# Patient Record
Sex: Male | Born: 1960 | Race: White | Hispanic: No | Marital: Married | Smoking: Former smoker
Health system: Southern US, Community
[De-identification: ages and names within clinical notes are randomized; demographics above are authoritative.]

## PROBLEM LIST (undated history)

## (undated) DIAGNOSIS — F4310 Post-traumatic stress disorder, unspecified: Secondary | ICD-10-CM

## (undated) DIAGNOSIS — K219 Gastro-esophageal reflux disease without esophagitis: Secondary | ICD-10-CM

## (undated) DIAGNOSIS — Z720 Tobacco use: Secondary | ICD-10-CM

## (undated) DIAGNOSIS — E785 Hyperlipidemia, unspecified: Secondary | ICD-10-CM

## (undated) DIAGNOSIS — T8781 Dehiscence of amputation stump: Secondary | ICD-10-CM

## (undated) DIAGNOSIS — N179 Acute kidney failure, unspecified: Secondary | ICD-10-CM

## (undated) DIAGNOSIS — I499 Cardiac arrhythmia, unspecified: Secondary | ICD-10-CM

## (undated) DIAGNOSIS — R002 Palpitations: Secondary | ICD-10-CM

## (undated) DIAGNOSIS — N200 Calculus of kidney: Secondary | ICD-10-CM

## (undated) DIAGNOSIS — Z89439 Acquired absence of unspecified foot: Secondary | ICD-10-CM

## (undated) DIAGNOSIS — I10 Essential (primary) hypertension: Secondary | ICD-10-CM

## (undated) DIAGNOSIS — E11621 Type 2 diabetes mellitus with foot ulcer: Secondary | ICD-10-CM

## (undated) DIAGNOSIS — F329 Major depressive disorder, single episode, unspecified: Secondary | ICD-10-CM

## (undated) DIAGNOSIS — F32A Depression, unspecified: Secondary | ICD-10-CM

## (undated) DIAGNOSIS — Z9889 Other specified postprocedural states: Secondary | ICD-10-CM

## (undated) DIAGNOSIS — J449 Chronic obstructive pulmonary disease, unspecified: Secondary | ICD-10-CM

## (undated) DIAGNOSIS — I998 Other disorder of circulatory system: Secondary | ICD-10-CM

## (undated) DIAGNOSIS — L97529 Non-pressure chronic ulcer of other part of left foot with unspecified severity: Secondary | ICD-10-CM

## (undated) DIAGNOSIS — I5032 Chronic diastolic (congestive) heart failure: Secondary | ICD-10-CM

## (undated) DIAGNOSIS — K7460 Unspecified cirrhosis of liver: Secondary | ICD-10-CM

## (undated) DIAGNOSIS — F419 Anxiety disorder, unspecified: Secondary | ICD-10-CM

## (undated) DIAGNOSIS — Z8249 Family history of ischemic heart disease and other diseases of the circulatory system: Secondary | ICD-10-CM

## (undated) DIAGNOSIS — L02416 Cutaneous abscess of left lower limb: Secondary | ICD-10-CM

## (undated) DIAGNOSIS — T1490XA Injury, unspecified, initial encounter: Secondary | ICD-10-CM

## (undated) DIAGNOSIS — E119 Type 2 diabetes mellitus without complications: Secondary | ICD-10-CM

## (undated) DIAGNOSIS — K760 Fatty (change of) liver, not elsewhere classified: Secondary | ICD-10-CM

## (undated) DIAGNOSIS — Z87442 Personal history of urinary calculi: Secondary | ICD-10-CM

## (undated) DIAGNOSIS — J189 Pneumonia, unspecified organism: Secondary | ICD-10-CM

## (undated) DIAGNOSIS — G459 Transient cerebral ischemic attack, unspecified: Secondary | ICD-10-CM

## (undated) DIAGNOSIS — Z9289 Personal history of other medical treatment: Secondary | ICD-10-CM

## (undated) HISTORY — DX: Major depressive disorder, single episode, unspecified: F32.9

## (undated) HISTORY — PX: FOOT AMPUTATION: SHX951

## (undated) HISTORY — DX: Injury, unspecified, initial encounter: T14.90XA

## (undated) HISTORY — DX: Chronic obstructive pulmonary disease, unspecified: J44.9

## (undated) HISTORY — DX: Chronic diastolic (congestive) heart failure: I50.32

## (undated) HISTORY — DX: Type 2 diabetes mellitus without complications: E11.9

## (undated) HISTORY — DX: Essential (primary) hypertension: I10

## (undated) HISTORY — DX: Dehiscence of amputation stump: T87.81

## (undated) HISTORY — DX: Anxiety disorder, unspecified: F41.9

## (undated) HISTORY — PX: TONSILLECTOMY: SUR1361

## (undated) HISTORY — DX: Cutaneous abscess of left lower limb: L02.416

## (undated) HISTORY — DX: Family history of ischemic heart disease and other diseases of the circulatory system: Z82.49

## (undated) HISTORY — PX: TENDON LENGTHENING: SHX395

## (undated) HISTORY — PX: LITHOTRIPSY: SUR834

## (undated) HISTORY — DX: Acute kidney failure, unspecified: N17.9

## (undated) HISTORY — DX: Tobacco use: Z72.0

## (undated) HISTORY — DX: Acquired absence of unspecified foot: Z89.439

## (undated) HISTORY — DX: Other specified postprocedural states: Z98.890

## (undated) HISTORY — DX: Hyperlipidemia, unspecified: E78.5

## (undated) HISTORY — DX: Personal history of other medical treatment: Z92.89

## (undated) HISTORY — DX: Other disorder of circulatory system: I99.8

## (undated) HISTORY — DX: Depression, unspecified: F32.A

---

## 1999-04-15 ENCOUNTER — Emergency Department (HOSPITAL_COMMUNITY): Admission: EM | Admit: 1999-04-15 | Discharge: 1999-04-15 | Payer: Self-pay | Admitting: Emergency Medicine

## 1999-04-15 ENCOUNTER — Encounter: Payer: Self-pay | Admitting: Emergency Medicine

## 1999-04-21 ENCOUNTER — Ambulatory Visit (HOSPITAL_COMMUNITY): Admission: RE | Admit: 1999-04-21 | Discharge: 1999-04-21 | Payer: Self-pay | Admitting: Orthopedic Surgery

## 1999-04-21 ENCOUNTER — Encounter: Payer: Self-pay | Admitting: Orthopedic Surgery

## 2000-09-01 ENCOUNTER — Ambulatory Visit (HOSPITAL_COMMUNITY): Admission: RE | Admit: 2000-09-01 | Discharge: 2000-09-01 | Payer: Self-pay | Admitting: *Deleted

## 2000-09-01 ENCOUNTER — Encounter: Payer: Self-pay | Admitting: *Deleted

## 2001-11-29 ENCOUNTER — Encounter: Payer: Self-pay | Admitting: Internal Medicine

## 2001-11-29 ENCOUNTER — Emergency Department (HOSPITAL_COMMUNITY): Admission: EM | Admit: 2001-11-29 | Discharge: 2001-11-29 | Payer: Self-pay | Admitting: Emergency Medicine

## 2006-11-23 ENCOUNTER — Emergency Department (HOSPITAL_COMMUNITY): Admission: EM | Admit: 2006-11-23 | Discharge: 2006-11-23 | Payer: Self-pay | Admitting: Family Medicine

## 2007-06-30 ENCOUNTER — Encounter: Admission: RE | Admit: 2007-06-30 | Discharge: 2007-06-30 | Payer: Self-pay | Admitting: Family Medicine

## 2007-07-15 ENCOUNTER — Encounter: Admission: RE | Admit: 2007-07-15 | Discharge: 2007-07-15 | Payer: Self-pay | Admitting: Family Medicine

## 2008-01-15 ENCOUNTER — Emergency Department (HOSPITAL_COMMUNITY): Admission: EM | Admit: 2008-01-15 | Discharge: 2008-01-15 | Payer: Self-pay | Admitting: Emergency Medicine

## 2008-03-15 ENCOUNTER — Ambulatory Visit: Payer: Self-pay

## 2008-03-15 ENCOUNTER — Encounter: Payer: Self-pay | Admitting: Internal Medicine

## 2008-03-15 ENCOUNTER — Ambulatory Visit: Payer: Self-pay | Admitting: Internal Medicine

## 2008-07-22 ENCOUNTER — Inpatient Hospital Stay (HOSPITAL_COMMUNITY): Admission: EM | Admit: 2008-07-22 | Discharge: 2008-08-15 | Payer: Self-pay | Admitting: Emergency Medicine

## 2008-08-07 ENCOUNTER — Encounter (INDEPENDENT_AMBULATORY_CARE_PROVIDER_SITE_OTHER): Payer: Self-pay | Admitting: Orthopaedic Surgery

## 2008-10-02 ENCOUNTER — Encounter: Admission: RE | Admit: 2008-10-02 | Discharge: 2008-12-18 | Payer: Self-pay | Admitting: Orthopaedic Surgery

## 2009-01-16 ENCOUNTER — Encounter: Admission: RE | Admit: 2009-01-16 | Discharge: 2009-03-20 | Payer: Self-pay | Admitting: Orthopedic Surgery

## 2009-01-17 ENCOUNTER — Ambulatory Visit (HOSPITAL_COMMUNITY): Payer: Self-pay | Admitting: Psychiatry

## 2009-01-27 ENCOUNTER — Ambulatory Visit (HOSPITAL_COMMUNITY): Payer: Self-pay | Admitting: Marriage and Family Therapist

## 2009-02-04 ENCOUNTER — Ambulatory Visit (HOSPITAL_COMMUNITY): Payer: Self-pay | Admitting: Marriage and Family Therapist

## 2009-02-17 ENCOUNTER — Ambulatory Visit (HOSPITAL_COMMUNITY): Payer: Self-pay | Admitting: Marriage and Family Therapist

## 2009-02-17 ENCOUNTER — Ambulatory Visit (HOSPITAL_COMMUNITY): Payer: Self-pay | Admitting: Psychiatry

## 2009-02-21 ENCOUNTER — Emergency Department (HOSPITAL_COMMUNITY): Admission: EM | Admit: 2009-02-21 | Discharge: 2009-02-22 | Payer: Self-pay | Admitting: Emergency Medicine

## 2009-02-25 ENCOUNTER — Ambulatory Visit (HOSPITAL_COMMUNITY): Payer: Self-pay | Admitting: Marriage and Family Therapist

## 2009-03-05 ENCOUNTER — Ambulatory Visit (HOSPITAL_COMMUNITY): Payer: Self-pay | Admitting: Marriage and Family Therapist

## 2009-03-12 ENCOUNTER — Ambulatory Visit (HOSPITAL_COMMUNITY): Payer: Self-pay | Admitting: Marriage and Family Therapist

## 2009-03-19 ENCOUNTER — Ambulatory Visit (HOSPITAL_COMMUNITY): Payer: Self-pay | Admitting: Marriage and Family Therapist

## 2009-03-26 ENCOUNTER — Ambulatory Visit (HOSPITAL_COMMUNITY): Payer: Self-pay | Admitting: Marriage and Family Therapist

## 2009-04-01 ENCOUNTER — Encounter: Admission: RE | Admit: 2009-04-01 | Discharge: 2009-04-01 | Payer: Self-pay | Admitting: Orthopaedic Surgery

## 2009-04-04 ENCOUNTER — Ambulatory Visit (HOSPITAL_COMMUNITY): Payer: Self-pay | Admitting: Psychiatry

## 2009-04-09 ENCOUNTER — Ambulatory Visit (HOSPITAL_COMMUNITY): Payer: Self-pay | Admitting: Marriage and Family Therapist

## 2009-04-22 ENCOUNTER — Ambulatory Visit (HOSPITAL_COMMUNITY): Payer: Self-pay | Admitting: Marriage and Family Therapist

## 2009-05-07 ENCOUNTER — Ambulatory Visit (HOSPITAL_COMMUNITY): Payer: Self-pay | Admitting: Marriage and Family Therapist

## 2009-05-30 ENCOUNTER — Ambulatory Visit (HOSPITAL_COMMUNITY): Payer: Self-pay | Admitting: Psychiatry

## 2009-07-18 ENCOUNTER — Ambulatory Visit (HOSPITAL_COMMUNITY): Admission: RE | Admit: 2009-07-18 | Discharge: 2009-07-19 | Payer: Self-pay | Admitting: Orthopaedic Surgery

## 2009-07-25 ENCOUNTER — Ambulatory Visit (HOSPITAL_COMMUNITY): Payer: Self-pay | Admitting: Psychiatry

## 2009-08-04 ENCOUNTER — Ambulatory Visit (HOSPITAL_COMMUNITY): Payer: Self-pay | Admitting: Marriage and Family Therapist

## 2009-08-12 ENCOUNTER — Ambulatory Visit (HOSPITAL_COMMUNITY): Payer: Self-pay | Admitting: Marriage and Family Therapist

## 2009-08-26 ENCOUNTER — Ambulatory Visit (HOSPITAL_COMMUNITY): Payer: Self-pay | Admitting: Marriage and Family Therapist

## 2009-09-10 ENCOUNTER — Ambulatory Visit (HOSPITAL_COMMUNITY): Payer: Self-pay | Admitting: Marriage and Family Therapist

## 2009-09-22 ENCOUNTER — Ambulatory Visit (HOSPITAL_COMMUNITY): Payer: Self-pay | Admitting: Psychiatry

## 2009-09-24 ENCOUNTER — Ambulatory Visit (HOSPITAL_COMMUNITY): Payer: Self-pay | Admitting: Marriage and Family Therapist

## 2009-10-13 ENCOUNTER — Ambulatory Visit (HOSPITAL_COMMUNITY): Payer: Self-pay | Admitting: Psychiatry

## 2009-10-17 ENCOUNTER — Ambulatory Visit (HOSPITAL_COMMUNITY): Admission: RE | Admit: 2009-10-17 | Discharge: 2009-10-17 | Payer: Self-pay | Admitting: Orthopaedic Surgery

## 2009-11-03 ENCOUNTER — Ambulatory Visit (HOSPITAL_COMMUNITY): Payer: Self-pay | Admitting: Marriage and Family Therapist

## 2009-11-19 ENCOUNTER — Ambulatory Visit (HOSPITAL_COMMUNITY): Payer: Self-pay | Admitting: Marriage and Family Therapist

## 2009-12-10 ENCOUNTER — Ambulatory Visit (HOSPITAL_COMMUNITY): Payer: Self-pay | Admitting: Marriage and Family Therapist

## 2010-01-05 ENCOUNTER — Ambulatory Visit (HOSPITAL_COMMUNITY): Payer: Self-pay | Admitting: Marriage and Family Therapist

## 2010-02-02 ENCOUNTER — Ambulatory Visit (HOSPITAL_COMMUNITY): Payer: Self-pay | Admitting: Marriage and Family Therapist

## 2010-03-02 ENCOUNTER — Ambulatory Visit (HOSPITAL_COMMUNITY): Payer: Self-pay | Admitting: Marriage and Family Therapist

## 2010-04-07 ENCOUNTER — Ambulatory Visit (HOSPITAL_COMMUNITY): Payer: Self-pay | Admitting: Marriage and Family Therapist

## 2010-07-12 ENCOUNTER — Encounter: Payer: Self-pay | Admitting: Family Medicine

## 2010-09-07 LAB — PROTIME-INR
INR: 1.05 (ref 0.00–1.49)
Prothrombin Time: 13.6 seconds (ref 11.6–15.2)

## 2010-09-08 LAB — CBC
HCT: 43.5 % (ref 39.0–52.0)
Hemoglobin: 15.1 g/dL (ref 13.0–17.0)
RBC: 4.89 MIL/uL (ref 4.22–5.81)
WBC: 12.7 10*3/uL — ABNORMAL HIGH (ref 4.0–10.5)

## 2010-09-08 LAB — BASIC METABOLIC PANEL
GFR calc non Af Amer: >60 mL/min (ref >60)
Potassium: 4.3 mEq/L (ref 3.5–5.1)
Sodium: 136 mEq/L (ref 135–145)

## 2010-09-08 LAB — GLUCOSE, CAPILLARY: Glucose-Capillary: 284 mg/dL — ABNORMAL HIGH (ref 70–99)

## 2010-09-25 LAB — POCT I-STAT, CHEM 8
Creatinine, Ser: 0.9 mg/dL (ref 0.4–1.5)
Hemoglobin: 14.6 g/dL (ref 13.0–17.0)
Potassium: 4.2 mEq/L (ref 3.5–5.1)
Sodium: 135 mEq/L (ref 135–145)

## 2010-09-25 LAB — DIFFERENTIAL
Basophils Relative: 1 % (ref 0–1)
Lymphs Abs: 1.4 10*3/uL (ref 0.7–4.0)
Monocytes Relative: 3 % (ref 3–12)
Neutro Abs: 9.4 10*3/uL — ABNORMAL HIGH (ref 1.7–7.7)
Neutrophils Relative %: 83 % — ABNORMAL HIGH (ref 43–77)

## 2010-09-25 LAB — CBC
RBC: 4.48 MIL/uL (ref 4.22–5.81)
WBC: 11.4 10*3/uL — ABNORMAL HIGH (ref 4.0–10.5)

## 2010-09-25 LAB — POCT CARDIAC MARKERS
CKMB, poc: 2.9 ng/mL (ref 1.0–8.0)
Myoglobin, poc: 90.8 ng/mL (ref 12–200)

## 2010-10-06 LAB — BASIC METABOLIC PANEL
BUN: 10 mg/dL (ref 6–23)
BUN: 37 mg/dL — ABNORMAL HIGH (ref 6–23)
BUN: 41 mg/dL — ABNORMAL HIGH (ref 6–23)
CO2: 24 mEq/L (ref 19–32)
CO2: 26 mEq/L (ref 19–32)
CO2: 28 mEq/L (ref 19–32)
Calcium: 8.4 mg/dL (ref 8.4–10.5)
Calcium: 8.4 mg/dL (ref 8.4–10.5)
Calcium: 8.6 mg/dL (ref 8.4–10.5)
Calcium: 8.9 mg/dL (ref 8.4–10.5)
Calcium: 8.2 mg/dL — ABNORMAL LOW (ref 8.4–10.5)
Calcium: 8.5 mg/dL (ref 8.4–10.5)
Calcium: 8.6 mg/dL (ref 8.4–10.5)
Calcium: 8.6 mg/dL (ref 8.4–10.5)
Chloride: 103 mEq/L (ref 96–112)
Chloride: 98 mEq/L (ref 96–112)
Chloride: 102 mEq/L (ref 96–112)
Chloride: 105 mEq/L (ref 96–112)
Chloride: 108 mEq/L (ref 96–112)
Creatinine, Ser: 0.66 mg/dL (ref 0.4–1.5)
Creatinine, Ser: 0.8 mg/dL (ref 0.4–1.5)
Creatinine, Ser: 0.89 mg/dL (ref 0.4–1.5)
Creatinine, Ser: 0.93 mg/dL (ref 0.4–1.5)
Creatinine, Ser: 3.69 mg/dL — ABNORMAL HIGH (ref 0.4–1.5)
Creatinine, Ser: 5.13 mg/dL — ABNORMAL HIGH (ref 0.4–1.5)
Creatinine, Ser: 5.26 mg/dL — ABNORMAL HIGH (ref 0.4–1.5)
Creatinine, Ser: 5.95 mg/dL — ABNORMAL HIGH (ref 0.4–1.5)
GFR calc Af Amer: >60 mL/min (ref >60)
GFR calc Af Amer: >60 mL/min (ref >60)
GFR calc Af Amer: 12 mL/min — ABNORMAL LOW (ref >60)
GFR calc Af Amer: 14 mL/min — ABNORMAL LOW (ref >60)
GFR calc Af Amer: 15 mL/min — ABNORMAL LOW (ref >60)
GFR calc Af Amer: 17 mL/min — ABNORMAL LOW (ref >60)
GFR calc Af Amer: 21 mL/min — ABNORMAL LOW (ref >60)
GFR calc non Af Amer: >60 mL/min (ref >60)
GFR calc non Af Amer: >60 mL/min (ref >60)
GFR calc non Af Amer: >60 mL/min (ref >60)
GFR calc non Af Amer: >60 mL/min (ref >60)
GFR calc non Af Amer: 10 mL/min — ABNORMAL LOW (ref >60)
GFR calc non Af Amer: 10 mL/min — ABNORMAL LOW (ref >60)
GFR calc non Af Amer: 10 mL/min — ABNORMAL LOW (ref >60)
GFR calc non Af Amer: 10 mL/min — ABNORMAL LOW (ref >60)
GFR calc non Af Amer: 14 mL/min — ABNORMAL LOW (ref >60)
GFR calc non Af Amer: 18 mL/min — ABNORMAL LOW (ref >60)
Glucose, Bld: 118 mg/dL — ABNORMAL HIGH (ref 70–99)
Glucose, Bld: 125 mg/dL — ABNORMAL HIGH (ref 70–99)
Glucose, Bld: 136 mg/dL — ABNORMAL HIGH (ref 70–99)
Glucose, Bld: 167 mg/dL — ABNORMAL HIGH (ref 70–99)
Glucose, Bld: 86 mg/dL (ref 70–99)
Glucose, Bld: 89 mg/dL (ref 70–99)
Glucose, Bld: 90 mg/dL (ref 70–99)
Glucose, Bld: 96 mg/dL (ref 70–99)
Glucose, Bld: 97 mg/dL (ref 70–99)
Potassium: 4 mEq/L (ref 3.5–5.1)
Potassium: 4 mEq/L (ref 3.5–5.1)
Potassium: 4.2 mEq/L (ref 3.5–5.1)
Potassium: 4.2 mEq/L (ref 3.5–5.1)
Potassium: 4.4 mEq/L (ref 3.5–5.1)
Potassium: 4.6 mEq/L (ref 3.5–5.1)
Sodium: 130 mEq/L — ABNORMAL LOW (ref 135–145)
Sodium: 131 mEq/L — ABNORMAL LOW (ref 135–145)
Sodium: 134 mEq/L — ABNORMAL LOW (ref 135–145)
Sodium: 140 mEq/L (ref 135–145)
Sodium: 135 mEq/L (ref 135–145)
Sodium: 136 mEq/L (ref 135–145)
Sodium: 136 mEq/L (ref 135–145)
Sodium: 140 mEq/L (ref 135–145)

## 2010-10-06 LAB — CBC
HCT: 35.5 % — ABNORMAL LOW (ref 39.0–52.0)
HCT: 35.8 % — ABNORMAL LOW (ref 39.0–52.0)
HCT: 36.8 % — ABNORMAL LOW (ref 39.0–52.0)
HCT: 37.1 % — ABNORMAL LOW (ref 39.0–52.0)
HCT: 29.2 % — ABNORMAL LOW (ref 39.0–52.0)
HCT: 29.9 % — ABNORMAL LOW (ref 39.0–52.0)
HCT: 30.9 % — ABNORMAL LOW (ref 39.0–52.0)
Hemoglobin: 12.1 g/dL — ABNORMAL LOW (ref 13.0–17.0)
Hemoglobin: 12.2 g/dL — ABNORMAL LOW (ref 13.0–17.0)
Hemoglobin: 12.3 g/dL — ABNORMAL LOW (ref 13.0–17.0)
Hemoglobin: 12.3 g/dL — ABNORMAL LOW (ref 13.0–17.0)
Hemoglobin: 12.6 g/dL — ABNORMAL LOW (ref 13.0–17.0)
Hemoglobin: 12.7 g/dL — ABNORMAL LOW (ref 13.0–17.0)
Hemoglobin: 12.7 g/dL — ABNORMAL LOW (ref 13.0–17.0)
Hemoglobin: 12.9 g/dL — ABNORMAL LOW (ref 13.0–17.0)
Hemoglobin: 12.9 g/dL — ABNORMAL LOW (ref 13.0–17.0)
Hemoglobin: 13.7 g/dL (ref 13.0–17.0)
Hemoglobin: 15 g/dL (ref 13.0–17.0)
Hemoglobin: 10.3 g/dL — ABNORMAL LOW (ref 13.0–17.0)
Hemoglobin: 10.4 g/dL — ABNORMAL LOW (ref 13.0–17.0)
Hemoglobin: 10.8 g/dL — ABNORMAL LOW (ref 13.0–17.0)
Hemoglobin: 11.1 g/dL — ABNORMAL LOW (ref 13.0–17.0)
Hemoglobin: 11.2 g/dL — ABNORMAL LOW (ref 13.0–17.0)
MCHC: 34.1 g/dL (ref 30.0–36.0)
MCHC: 34.1 g/dL (ref 30.0–36.0)
MCHC: 34.5 g/dL (ref 30.0–36.0)
MCHC: 34.6 g/dL (ref 30.0–36.0)
MCHC: 34.8 g/dL (ref 30.0–36.0)
MCHC: 34.9 g/dL (ref 30.0–36.0)
MCHC: 34.6 g/dL (ref 30.0–36.0)
MCHC: 34.8 g/dL (ref 30.0–36.0)
MCHC: 34.9 g/dL (ref 30.0–36.0)
MCHC: 34.9 g/dL (ref 30.0–36.0)
MCV: 89.4 fL (ref 78.0–100.0)
MCV: 89.7 fL (ref 78.0–100.0)
MCV: 89.8 fL (ref 78.0–100.0)
MCV: 89.9 fL (ref 78.0–100.0)
MCV: 87.1 fL (ref 78.0–100.0)
MCV: 87.3 fL (ref 78.0–100.0)
MCV: 87.5 fL (ref 78.0–100.0)
MCV: 88.1 fL (ref 78.0–100.0)
Platelets: 234 10*3/uL (ref 150–400)
Platelets: 263 10*3/uL (ref 150–400)
Platelets: 303 10*3/uL (ref 150–400)
Platelets: 453 10*3/uL — ABNORMAL HIGH (ref 150–400)
Platelets: 454 10*3/uL — ABNORMAL HIGH (ref 150–400)
Platelets: 425 10*3/uL — ABNORMAL HIGH (ref 150–400)
Platelets: 440 10*3/uL — ABNORMAL HIGH (ref 150–400)
Platelets: 471 10*3/uL — ABNORMAL HIGH (ref 150–400)
RBC: 3.97 MIL/uL — ABNORMAL LOW (ref 4.22–5.81)
RBC: 3.98 MIL/uL — ABNORMAL LOW (ref 4.22–5.81)
RBC: 4 MIL/uL — ABNORMAL LOW (ref 4.22–5.81)
RBC: 4.11 MIL/uL — ABNORMAL LOW (ref 4.22–5.81)
RBC: 4.21 MIL/uL — ABNORMAL LOW (ref 4.22–5.81)
RBC: 3.27 MIL/uL — ABNORMAL LOW (ref 4.22–5.81)
RBC: 3.35 MIL/uL — ABNORMAL LOW (ref 4.22–5.81)
RBC: 3.4 MIL/uL — ABNORMAL LOW (ref 4.22–5.81)
RBC: 3.43 MIL/uL — ABNORMAL LOW (ref 4.22–5.81)
RBC: 3.47 MIL/uL — ABNORMAL LOW (ref 4.22–5.81)
RBC: 3.63 MIL/uL — ABNORMAL LOW (ref 4.22–5.81)
RDW: 13.3 % (ref 11.5–15.5)
RDW: 13.4 % (ref 11.5–15.5)
RDW: 13.4 % (ref 11.5–15.5)
RDW: 13.5 % (ref 11.5–15.5)
RDW: 13.8 % (ref 11.5–15.5)
RDW: 13.8 % (ref 11.5–15.5)
RDW: 14 % (ref 11.5–15.5)
RDW: 14.4 % (ref 11.5–15.5)
RDW: 13.6 % (ref 11.5–15.5)
RDW: 13.7 % (ref 11.5–15.5)
RDW: 13.8 % (ref 11.5–15.5)
RDW: 14.2 % (ref 11.5–15.5)
WBC: 10.5 10*3/uL (ref 4.0–10.5)
WBC: 11.2 10*3/uL — ABNORMAL HIGH (ref 4.0–10.5)
WBC: 12.6 10*3/uL — ABNORMAL HIGH (ref 4.0–10.5)
WBC: 13.4 10*3/uL — ABNORMAL HIGH (ref 4.0–10.5)
WBC: 15.1 10*3/uL — ABNORMAL HIGH (ref 4.0–10.5)
WBC: 13.6 10*3/uL — ABNORMAL HIGH (ref 4.0–10.5)
WBC: 13.9 10*3/uL — ABNORMAL HIGH (ref 4.0–10.5)
WBC: 14.5 10*3/uL — ABNORMAL HIGH (ref 4.0–10.5)
WBC: 14.8 10*3/uL — ABNORMAL HIGH (ref 4.0–10.5)
WBC: 15.5 10*3/uL — ABNORMAL HIGH (ref 4.0–10.5)
WBC: 16 10*3/uL — ABNORMAL HIGH (ref 4.0–10.5)
WBC: 16 10*3/uL — ABNORMAL HIGH (ref 4.0–10.5)

## 2010-10-06 LAB — PROTIME-INR
INR: 1 (ref 0.00–1.49)
Prothrombin Time: 13 seconds (ref 11.6–15.2)

## 2010-10-06 LAB — UIFE/LIGHT CHAINS/TP QN, 24-HR UR
Alpha 2, Urine: DETECTED — AB
Beta, Urine: DETECTED — AB
Free Kappa Lt Chains,Ur: 4.97 mg/dL — ABNORMAL HIGH (ref 0.04–1.51)
Total Protein, Urine: 13.1 mg/dL

## 2010-10-06 LAB — RENAL FUNCTION PANEL
Albumin: 2.4 g/dL — ABNORMAL LOW (ref 3.5–5.2)
BUN: 39 mg/dL — ABNORMAL HIGH (ref 6–23)
CO2: 23 mEq/L (ref 19–32)
CO2: 23 mEq/L (ref 19–32)
Calcium: 8.7 mg/dL (ref 8.4–10.5)
Calcium: 8.9 mg/dL (ref 8.4–10.5)
Chloride: 105 mEq/L (ref 96–112)
Chloride: 106 mEq/L (ref 96–112)
GFR calc Af Amer: 13 mL/min — ABNORMAL LOW (ref >60)
GFR calc Af Amer: 24 mL/min — ABNORMAL LOW (ref >60)
GFR calc non Af Amer: 11 mL/min — ABNORMAL LOW (ref >60)
GFR calc non Af Amer: 20 mL/min — ABNORMAL LOW (ref >60)
Glucose, Bld: 79 mg/dL (ref 70–99)
Glucose, Bld: 92 mg/dL (ref 70–99)
Phosphorus: 5.7 mg/dL — ABNORMAL HIGH (ref 2.3–4.6)
Potassium: 4.2 mEq/L (ref 3.5–5.1)
Sodium: 137 mEq/L (ref 135–145)
Sodium: 139 mEq/L (ref 135–145)

## 2010-10-06 LAB — ANTI-NEUTROPHIL ANTIBODY

## 2010-10-06 LAB — URINALYSIS, ROUTINE W REFLEX MICROSCOPIC
Glucose, UA: NEGATIVE mg/dL
Leukocytes, UA: NEGATIVE
Nitrite: NEGATIVE
Specific Gravity, Urine: 1.011 (ref 1.005–1.030)
pH: 6 (ref 5.0–8.0)

## 2010-10-06 LAB — DIFFERENTIAL
Basophils Absolute: 0.1 10*3/uL (ref 0.0–0.1)
Basophils Relative: 1 % (ref 0–1)
Lymphocytes Relative: 15 % (ref 12–46)
Lymphocytes Relative: 17 % (ref 12–46)
Monocytes Absolute: 1.9 10*3/uL — ABNORMAL HIGH (ref 0.1–1.0)
Monocytes Relative: 15 % — ABNORMAL HIGH (ref 3–12)
Neutro Abs: 13.2 10*3/uL — ABNORMAL HIGH (ref 1.7–7.7)
Neutro Abs: 8.3 10*3/uL — ABNORMAL HIGH (ref 1.7–7.7)
Neutrophils Relative %: 66 % (ref 43–77)
Neutrophils Relative %: 74 % (ref 43–77)

## 2010-10-06 LAB — MAGNESIUM
Magnesium: 1.9 mg/dL (ref 1.5–2.5)
Magnesium: 2.2 mg/dL (ref 1.5–2.5)

## 2010-10-06 LAB — URINE MICROSCOPIC-ADD ON

## 2010-10-06 LAB — IRON AND TIBC: UIBC: 192 ug/dL

## 2010-10-06 LAB — C3 COMPLEMENT: C3 Complement: 179 mg/dL (ref 88–201)

## 2010-10-06 LAB — VANCOMYCIN, TROUGH: Vancomycin Tr: 30.6 ug/mL (ref 10.0–20.0)

## 2010-10-06 LAB — TYPE AND SCREEN
ABO/RH(D): O POS
Antibody Screen: NEGATIVE

## 2010-10-06 LAB — VANCOMYCIN, RANDOM: Vancomycin Rm: 14 ug/mL

## 2010-10-06 LAB — CREATININE, URINE, RANDOM: Creatinine, Urine: 47.6 mg/dL

## 2010-11-03 NOTE — Group Therapy Note (Signed)
Troy [name], Troy [name]                 ACCOUNT NO.:  [id number]   MEDICAL RECORD NO.:  [id number]          PATIENT TYPE:  REC   LOCATION:  [facility]                         FACILITY:  [facility]   PHYSICIAN:  Syed T. Arfeen, M.D.   DATE OF BIRTH:  31-Jan-1961                                 PROGRESS NOTE   Patient came in today for his followup appointment.  Patient endorsed  improvement with increased amitriptyline.  He has been sleeping better  and reported no side effects.  He has seen Dr. Ninfa Linden recently who  started him on Neurontin 300 mg at bedtime.  Initially, when he took the  Neurontin and amitriptyline together, he has difficulty falling asleep;  however, he changed Neurontin in the morning and since then he has been  tolerating better.  He still has some pain and prescribed hydromorphone,  Exalgo, 12 mg daily.  He is feeling less anxious, less depressed, and  more calm.  He has also seen Joanie for his counseling and he feels that  she is helping him.   ASSESSMENT:  1. Depressive disorder, not otherwise specified.  2. Post-traumatic stress disorder.   Currently, patient denies any suicidal thoughts, homicidal thoughts, or  any hallucination.  He has been tolerating medication okay.  His affect  is bright.  His mood is pleasant.  His thoughts are logical, linear, and  goal directed.  There were no abnormal or involuntary movements noted.   PLAN:  We will continue amitriptyline 100 mg at bedtime since that medication  is helping him.  He does not recall having any severe anxiety or panic  attack in past 1 month.  He will also continue seeing Lorene Dy for  counseling.  I will see him in 6 weeks.      Syed T. Adele Schilder, M.D.  Electronically Signed     STA/MEDQ  D:  02/17/2009  T:  02/17/2009  Job:  [id number]

## 2010-11-03 NOTE — Consult Note (Signed)
Troy [name], Troy [name]                 ACCOUNT NO.:  [id number]   MEDICAL RECORD NO.:  [id number]          PATIENT TYPE:  INP   LOCATION:  5018                         FACILITY:  [facility]   PHYSICIAN:  Donato Heinz, M.D.DATE OF BIRTH:  [birth year]   DATE OF CONSULTATION:  DATE OF DISCHARGE:                                 CONSULTATION   REFERRING PHYSICIAN:  Lind Guest. Ninfa Linden, MD   REASON FOR CONSULTATION:  Acute renal failure.   HISTORY OF PRESENT ILLNESS:  Troy [name] is a 50 year old white male  without significant past medical history who suffered a crush injury to  both of his feet after a steel beam fell on him.  He was admitted by the  Ortho service on July 22, 2008, and had been treated with amputation  of some toes and a distal ray amputation resection as well as  debridement of both feet.  He had also been started on antibiotics and  his course had been uneventful until it was noted that his creatinine  was significantly different on August 06, 2008.  His creatinine on  admission was 0.93 and in the emergency department had been less than 1  for the first 10 days of his stay.  His last creatinine was 0.8 on  July 31, 2008, until it was rechecked on August 06, 2008, at which  time it was noted to be 6.04.  His BUN was 35.  This was repeated and  verified.  We were asked to further evaluate the rise in serum  creatinine.  It is important, however, that the patient was completely  asymptomatic over the last week of his hospitalization.  He has not had  any dysuria, pyuria, nausea, or vomiting, and has not had been  discoloration of his urine.  He also has received Toradol IV as well as  IV ibuprofen 800 mg p.o.  He did not receive any ACE inhibitors or ARBs  according to the [facility].  Also it does not appear that he had any sustained  hypotensive episodes during his hospitalization or during his surgeries.   ALLERGIES:  He has no known drug allergies.   PAST  MEDICAL HISTORY:  Some noncardiac chest pain and underwent a heart  catheterization in March 2002, which did not show any significant  disease.  He is not on any outpatient medicines.   FAMILY HISTORY:  Father died at age 53 from lung cancer.  Mother died at  age 27 from leukemia.  He has four brothers and two sisters, alive and  well.  No family history of kidney disease.   SOCIAL HISTORY:  He is married to his wife.  He has one biological  daughter and two children from his current marriage.  His daughter is  alive and well.  He works at [employer] and he has a 13-pack-year  tobacco history, currently smoking half a pack cigarettes a day.  He has  a few beers on the weekend.  No drug use.   REVIEW OF SYSTEMS:  GENERAL:  He denies any anorexia or malaise.  OPHTHALMIC:  No  blurred vision or photophobia.  ENT:  No tinnitus,  dysphagia, or odynophagia.  CARDIAC:  No chest pain, palpitations,  orthopnea, or PND.  PULMONARY:  No shortness of breath, hemoptysis, or  productive cough.  GI:  No nausea, vomiting, hematochezia, melena, or  bright blood per rectum.  GU:  No dysuria, pyuria, hematuria, urgency,  frequency, or retention.  NEUROLOGIC:  He does have pain in his feet  bilaterally.  All other systems are negative.   PHYSICAL EXAMINATION:  GENERAL:  A well-developed and well-nourished man  lying in bed in no apparent distress.  VITAL SIGNS:  Temperature 98.1, pulse 72, blood pressure 123/67,  respiratory rate is 18.  HEENT:  Head normocephalic and atraumatic.  Extraocular muscles are  intact.  No icterus.  Oropharynx without lesions.  NECK:  Supple.  No lymphadenopathy or bruits.  LUNGS:  Clear to auscultation and percussion bilaterally.  No rales or  rhonchi.  CARDIAC:  Regular rate and rhythm.  No precordial rub appreciated.  ABDOMEN:  Obese, normoactive bowel sounds, soft, nontender, and  nondistended.  No guarding, rebound, or bruits.  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   LABORATORY DATA:  Sodium is 138, potassium 4.5, chloride 103, CO2 of 25,  BUN 37, creatinine 5.95, glucose 90, calcium 8.6.  His white blood cell  count is 14.6, hemoglobin 10.8, and platelets 456.  Urine sodium of 61.  Vanco level random performed on August 06, 2008, was 100.7.  Urinalysis showed negative protein, negative nitrites, small blood, and  negative leukocyte esterase.  He had 0-2 white cells and 3-6 red cells  on microscopic exam.   ASSESSMENT AND PLAN:  1. Acute renal failure.  The patient's creatinine was normal on      July 31, 2008, and then rose to 6.04 on August 06, 2008.      Ultrasound showed no evidence of hydronephrosis.  He has no      symptoms or evidence of systemic inflammatory process.  His CPK was      37, so he does not suffer from rhabdo.  The underlying etiology may      likely be induced from his nonsteroidal use with concomitant      administration of vancomycin.  Theoretically, he could have had      worsening renal function with the nonsteroidals, which have caused      accumulation of vancomycin and the combination may worsen his renal      function.  However, he is currently without uremia.  He has been      nonoliguric.  We will check carbonate levels and SPEP and UPEP;      however, I am hopeful that with holding the nonsteroidals and the      vancomycin, his renal function will continue to improve.  No      indication for biopsy at this time.  We will continue to follow      closely.  2. Crush injury to his feet bilaterally.  He is status post      debridement per Orthopedics.  We will hold the vancomycin for now.  3. Supratherapeutic vancomycin.  Again, holding vancomycin, we will      follow trough levels.  4. History of noncardiac chest pain.  Negative catheterization.      Stable.   Thank you for this consultation.  We will  continue to follow along with  you.           ______________________________  Donato Heinz, M.D.     JC/MEDQ  D:  08/07/2008  T:  08/08/2008  Job:  [id number]

## 2010-11-03 NOTE — Assessment & Plan Note (Signed)
[facility] OFFICE NOTE   NAME:Coppola, JAKYRON SCHRANDT                        MRN:          [id number]  DATE:03/15/2008                            DOB:          1960/08/07    IDENTIFICATION:  Mr. Shortell is a 50 year old gentleman who was referred  for an abnormal EKG.  He is being evaluated for older ENT surgery.  EKG  in the preop testing showed diffuse T-wave inversion.   On talking to the patient, he says he had a heart attack in 2000 and he  underwent a heart catheterization (right-left) in 2002 that showed only  luminal irregularities.  No evidence for obstruction.  His LV function  was normal at 65%.  Right heart catheterization was normal, it showed  normal pressures.  An EKG also in 2003 is available that again shows a T-  wave inversion of slightly less pronounced than on preop evaluation.   On talking to the patient, he is active, works at the [employer].  Denies any problems doing his activities at work.  He denies chest pain.  No shortness of breath.  He is active all day.   MEDICATIONS:  None.   ALLERGIES:  None.   PAST MEDICAL HISTORY:  Question heart attack.   SOCIAL HISTORY:  The patient is married, smokes about half pack per day  for the past 25 years, drinks a few beers on the weekend.   FAMILY HISTORY:  Negative for premature CAD and also negative for sudden  cardiac death.   REVIEW OF SYSTEMS:  All systems reviewed and negative.  See above  problem except as noted above.   PHYSICAL EXAMINATION:  GENERAL:  On exam, the patient was in no  distress.  VITAL SIGNS:  Blood pressure was 127/70, pulse 77, weight not taken.  HEENT:  Normocephalic, atraumatic, EOMI, PERRL.  Mucous membranes are  moist.  Left tonsil is enlarged.  NECK:  JVP is normal.  No thyromegaly, no bruits.  LUNGS:  Clear without rales or wheezes.  CARDIAC:  Regular rate and rhythm.  S1 and S2.  No S3, S4, or  significant  murmurs.  ABDOMEN:  Shows no hepatomegaly.  Normal bowel sounds.  No masses.  EXTREMITIES:  Good distal pulses throughout, equal onset, no edema.   A 12-lead EKG shows normal sinus rhythm.  T-wave inversion at 77 beats  per minute.  T-wave inversion 123F V3 through V6.   IMPRESSION:  Mr. Roh is a 50 year old gentleman with no known coronary  artery disease.  He has had abnormal EKG for several years.  He is very  active without any evidence of what appear to be unstable symptoms.   With his EKG, I was concerned whether he had apical hypertrophy (apical  hypertrophic cardiomyopathy).  An echocardiogram was done that indeed  did not show this.  Left ventricular function was mildly thickened, but  otherwise not too unremarkable.  Overall function was normal.   From a cardiac standpoint, I think he is at low risk to proceed with the  above-planned surgery.  The patient has had an abnormal EKG for years.  He is active without any  signs of active cardiac ischemia.  Again low risk to proceed.   I have not set a definite followup.  I have counseled the patient on  tobacco cessation.  He should have a fasting lipid panel checked at some  point.     Fay Records, MD, [facility]  Electronically Signed    PVR/MedQ  DD: 03/19/2008  DT: 03/20/2008  Job #: [id number]   cc:   Margarita Rana, M.D.  Leonides Sake. Lucia Gaskins, M.D.

## 2010-11-03 NOTE — Op Note (Signed)
NAMELEVOY, ISIDORE                 ACCOUNT NO.:  [id number]   MEDICAL RECORD NO.:  [id number]          PATIENT TYPE:  INP   LOCATION:  5018                         FACILITY:  [facility]   PHYSICIAN:  Lind Guest. Ninfa Linden, M.D.DATE OF BIRTH:  01-05-1961   DATE OF PROCEDURE:  DATE OF DISCHARGE:                               OPERATIVE REPORT   PREOPERATIVE DIAGNOSIS:  Bilateral crushed foot injury, status post  serial irrigation and debridement.   POSTOPERATIVE DIAGNOSIS:  Bilateral crushed foot injury, status post  serial irrigation and debridement.   PROCEDURE:  1. Irrigation and debridement of right foot first ray with primary      closure.  2. Irrigation and debridement of left foot with fourth and fifth ray      resections and advancement of tissue.   SURGEON:  Lind Guest. Ninfa Linden, M.D.   ANESTHESIA:  General.   BLOOD LOSS:  450 mL.   COMPLICATIONS:  None.   INDICATIONS:  Briefly, [name] is a 50 year old who has been in the  hospital over the 3 weeks now with bilateral crushed foot injuries from  a 20-ton girder that was dropped on his feet.  He has undergone serial  irrigation and debridement in the right foot.  It shows just necrotic  changes at the tip of the first ray that was amputated.  The left foot  shows he has undergone serial irrigation and debridement as well as skin  grafting over dorsum of his foot and resection of the first to the third  rays.  There was plantar tissue along the first three rays that was  necrotic, and this is warranting further irrigation and debridement and  possible fourth and fifth ray amputations for the ability to be able to  close.   DESCRIPTION OF PROCEDURE:  After informed consent was obtained and  appropriate feet were marked, he was brought to the operating room and  placed supine on the operating room table.  General anesthesia was then  obtained.  Both the feet were prepped with Hibiclens and sterile drapes  were  applied.  A time-out was called to identify the correct patient and  correct feet.  I started with the more difficult left foot.  An area of  necrosis along the plantar aspect of his foot and cut out sharply with a  knife.  This left him with a gaping hole.  There were still necrotic  tissue in the mid foot musculature.  I had to then back the metatarsal  up using rongeur with further debridement of the first 3 metatarsals.   Next, I made a decision to proceed with fourth and fifth ray amputations  and ellipsed out the skin from the toes.  I was then able to advance the  tissue over the majority of the foot and bring this over to a closure.  I irrigated out the tissue in its entirety with normal saline as well.  The closure over the medial aspect of the foot was still tight, and I am  concerned about his ability to heal this area.  Xeroform followed by  well-padded sterile dressing was applied.  I then turned my attention to  the right foot.  I ellipsed out an area of dead tissue at the tip of the  first ray that was resected and bagged the bone up to the metatarsal  head.  I was able to get good bleeding tissue and thoroughly irrigated  this area and I was able to easily close the wound with interrupted 2-0  nylon suture.  Xeroform followed by  well-padded sterile dressing was applied over this area as well.  The  patient was awakened, extubated, and taken to recovery room in stable  condition.  Postoperatively, we will watch his wounds closely to get an  idea of whether or not he will heal the left foot or will this  necessitate an amputation through almost the level of the mid foot.      Lind Guest. Ninfa Linden, M.D.  Electronically Signed     CYB/MEDQ  D:  08/13/2008  T:  08/14/2008  Job:  [id number]

## 2010-11-03 NOTE — Discharge Summary (Signed)
NAMEJAQUANN, MCQUOWN                 ACCOUNT NO.:  [id number]   MEDICAL RECORD NO.:  [id number]          PATIENT TYPE:  INP   LOCATION:  5018                         FACILITY:  [facility]   PHYSICIAN:  Lind Guest. Ninfa Linden, M.D.DATE OF BIRTH:  1960-10-13   DATE OF ADMISSION:  07/22/2008  DATE OF DISCHARGE:  08/15/2008                               DISCHARGE SUMMARY   ADMITTING DIAGNOSIS:  Severe bilateral foot crush injuries.   DISCHARGE DIAGNOSIS:  Severe bilateral foot crush injuries.   PROCEDURE:  Multiple surgeries; irrigation and debridements and forefoot  amputations involving both feet during this hospitalization on multiple  dates.   HOSPITAL COURSE:  Briefly, Mr. Bullers is a 50 year old gentleman who was  at work with a [employer] when around the 17 tons steel bin fell and  landed on both of his feet.  He was brought to the [facility] ER and was  found to have sustained a severe crushing injury to the forefoot of both  his feet with the worst injury being to the left foot.  He had ischemic  changes throughout the forefoot on both feet and I admitted him to see  how these wounds would demarcate.  He was started on IV antibiotics and  during his hospitalization did have acute renal insufficiency with  elevation in his creatinine and we felt this was likely secondary to  NSAIDs and vancomycin.  A renal consultation was obtained and he was  seen by Dr. Marval Regal and other physicians from [facility].  Over the first week as the toes began to demarcate, it was recommended  that he go to the operating room for a surgical intervention.  On  July 30, 2008, he was taken to the operating room where he underwent  a right great toe amputation of the proximal phalanx and irrigation and  debridement of the left foot with first through third ray amputations.  He then continued convalesce in the hospital and again we watched him  closely from a standpoint of antibiotics and  renal function.  He was  taken back to operating room on August 03, 2008, where he underwent  irrigation and debridement of bilateral foot wounds and VAC sponge  placement to the left foot.  On August 06, 2008, he was taken back to  the operating room for further irrigation and debridement of the left  foot wound.  At that point, a skin graft was placed to left foot from a  left thigh donor site.  A VAC sponge was placed over this and we allowed  this to mature for several more days.  During this time, we did initiate  physical therapy services for him to increase his mobility.  On August 13, 2008, he was taken back to the operating room for a further  irrigation and debridement of bilateral foot wounds and I had to resect  the fourth and fifth rays at that time due to the nature of the injuries  still demarcating.  During this time, again he was followed for the  acute renal failure and his creatinine continued  to improve.  By the day  of discharge, his wounds were doing well enough that it was felt he  could be discharged safely to home.  He was ambulating on his heels and  his creatinine had improved to 3.39.  The renal physicians also felt he  could follow up as an outpatient closely for this.  He remained with  normal renal function in terms of urinating and his fluid balance  throughout the hospitalization in the light of the acute renal problems.  By the day of discharge, his wounds were improving greatly and it was  felt that he could be safely discharged to home.   DISPOSITION:  Home.   DISCHARGE INSTRUCTIONS:  While at home, he will continue dressing  changes on his bilateral feet daily and keep these clean and dry until  his followup appointment with me in the office in 2-4 days.  Followup  will be established with the renal physicians and [facility] as well.   DISCHARGE MEDICATIONS:  1. Doxycycline twice daily.  2. Percocet.  3. Robaxin.      Lind Guest.  Ninfa Linden, M.D.  Electronically Signed     CYB/MEDQ  D:  09/27/2008  T:  09/28/2008  Job:  [id number]

## 2010-11-03 NOTE — Group Therapy Note (Signed)
NAMEVENICE, RUYLE                 ACCOUNT NO.:  [id number]   MEDICAL RECORD NO.:  [id number]          PATIENT TYPE:  REC   LOCATION:  [facility]                         FACILITY:  [facility]   PHYSICIAN:  Syed T. Arfeen, M.D.   DATE OF BIRTH:  09/03/1960                                 PROGRESS NOTE   HISTORY:  The patient is a 50 year old married white man who came with his wife  for evaluation and treatment.  The patient was referred by his  orthopedic doctor, Dr. Jean Rosenthal. The patient had a crushing  injury to both his feet in February while he was working.  He reported  20 tons of beam fell on his feet.  He was taken to the hospital and  undergone multiple injuries.  He had a left foot partially amputated and  right big toe amputated.  The patient continued to endorse some pain.  As per the patient, he was having significant anxiety and insomnia.  Dr.  Ninfa Linden felt that the patient is going through post traumatic syndrome.  The patient denies any flashback or bad dreams about that injury.  However, he endorsed that he has been anxious, nervous and frustrated  because of the pain and loss of his freedom.  He endorsed that he gets  easily irritable when he cannot do the things due to pain.  He denies  any auditory hallucinations, suicidal thoughts or homicidal thoughts.  He describes being withdrawn, quiet, and sometime tearful when he thinks  about his injury and pain.  Wife mentioned that he tends to get very  quiet and lock his room, and sometimes inpatient with the grandkids.  However, she denies any violent behavior.  Dr. Ninfa Linden has started him  Elavil 25 mg for insomnia which the patient has been taking and recently  even taking 3 tablets, but he has partial relief.  The patient denies  any change in his appetite, weight and he denies any feeling of  restlessness.  He denies any feelings of worthlessness, hopelessness or  helplessness.  He is hoping that his recovery will  happen.  However, the  progress is slow.  He denies any paranoia or delusions.  He reported no  side effects of the medication at this time.   PAST PSYCHIATRIC HISTORY:  The patient denies any past psychiatric history, never seen any  psychiatrist no inpatient treatment.  No history of suicidal attempt or  psychosis.   PSYCHOSOCIAL:  The patient married twice.  His first marriage ended in divorce in 42.  He remarried in 2004 and his wife came with him on this appointment.  He  has one daughter who is 71 year old who has one son, but he has two  stepsons and together they have 5 grandkids. The patient was born and  raised in [place].  He denies any history of physical, verbal,  sexual or emotional abuse.   FAMILY HISTORY:  The patient denies any family history of any psychiatric illness.   EDUCATIONAL BACKGROUND:  The patient had a high school education.   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  He was  employed for the past 3 years.  However at this time, he is inactive and  in process of disability.  He had hired a Chief Executive Officer to facilitate the  progress for his worker's comp.   SUBSTANCE ABUSE:  The patient denies any illegal substance.  He had a history of drinking  alcohol, however, he reported as only social drinking.  He started since  age 38.  He still drinks 4-6 packs a week.  However, he denies any  seizures, blackouts, tremors, withdrawals or intoxication.   MEDICAL HISTORY:  As mentioned, the patient has bilateral crushing injuries to both his  feet.  He is scheduled to see Dr. Nicholaus Bloom for pain medicine.  Dr.  Ninfa Linden has tried in the beginning  OxyContin and now he is taking  Vicodin.  The patient is hoping that Dr. Nicholaus Bloom will help him as  he continued to endorse pain.  He is also taking Robaxin prescribed by  Dr. Ninfa Linden.   MENTAL STATUS EXAM:  The patient is a middle-aged, well-groomed, walking with cane.   He  endorsed no suicidal thoughts, homicidal thoughts or hallucination.  I  can see pain while he was walking on the cane as he is taking a few  steps and slow in walking.  He described mood has been euthymic, affect  constricted.  His thoughts were logical, linear and goal directed.  There was no paranoia, delusion or obsession.  His speech was normal  rate, rhythm.  Affect is mood appropriate.  He is alert and oriented x3.  He is cooperative with good eye contact.  His attention and  concentration is okay.  Insight, judgment and impulse control is okay.   DIAGNOSES:  AXIS I:  Depressive disorder not otherwise specified, rule out post-  traumatic stress disorder.  AXIS II:  Deferred.  AXIS III:  Please see medical history.  AXIS IV:  Moderate.  AXIS V:  55.   PLAN:  I talked to the patient about his symptoms in detail.  I recommended to  increase his Elavil to 100 mg.  I explained that Elavil is also  antidepressant and that can help his underlying depression, insomnia and  anxiety.  He has no side effects and has been tolerating up to 75 mg.  However, we will see if he can feel better with 100 mg of Elavil.  I  offered him to see a therapist for his stress and anxiety management  which he agreed.  We will schedule appointment with a therapist for  counseling.  We will increase collateral from his [place].  I will see  him in 3-4 weeks.  I  recommended if he has any side effects or any  questions, he can call us which he acknowledged.      Syed T. Adele Schilder, M.D.  Electronically Signed     STA/MEDQ  D:  01/17/2009  T:  01/17/2009  Job:  [id number]

## 2010-11-03 NOTE — Op Note (Signed)
Troy [name], Troy [name]                 ACCOUNT NO.:  [id number]   MEDICAL RECORD NO.:  [id number]          PATIENT TYPE:  INP   LOCATION:  5018                         FACILITY:  [facility]   PHYSICIAN:  Lind Guest. Ninfa Linden, M.D.DATE OF BIRTH:  1960-07-14   DATE OF PROCEDURE:  08/03/2008  DATE OF DISCHARGE:                               OPERATIVE REPORT   PREOPERATIVE DIAGNOSIS:  Bilateral crush-foot injuries status post  irrigation, debridement, and right great toe amputation, and left second  to third ray amputations.   POSTOPERATIVE DIAGNOSIS:  Bilateral Press-Fit injuries status post  irrigation, debridement, and right great toe amputation, and left second  to third ray amputations.   PROCEDURE:  1. Serial irrigation debridement of right foot great toe.  2. Serial irrigation debridement of left forefoot.  3. Vac sponge placement to left foot.   SURGEON:  Lind Guest. Ninfa Linden, MD   ANESTHESIA:  General.   ESTIMATED BLOOD LOSS:  Less than 100 mL.   COMPLICATIONS:  None.   INDICATIONS:  Briefly, Mr. Studley is a 50 year old with crushing injury  to both his feet.  We had to watch to see to how the feet would  demarcate itself and on Tuesday, this past week I took him to the  operating room for amputation of the right great toe and resection of  the first three rays of his left foot.  He returns today for repeat  serial irrigation and debridement due to necrotic tissue especially on  the dorsum of his left foot and the tip of his right great toe.  The  risks and benefits of surgery explained to him at length and he agreed  to proceed with surgery.   PROCEDURE DESCRIPTION:  After informed consent was obtained, appropriate  left and right feet were marked.  He was brought to the operating room  and placed supine on the operating table.  General anesthesia was  obtained.  Both feet were prepped and draped with Betadine scrub and  painted.  A time-out was called and he was  identified as the correct  patient and the correct right and left foot.  First I turned my  attention to the right foot.  I ellipsed out necrotic tissue of the tip  of the remaining aspect of the great toe.  I debrided the bone back to  almost the metatarsal head.  I was able to get good bleeding tissue and  then close this with interrupted 2-0 nylon suture.  Xeroform followed by  well-padded sterile dressing was applied.  As far as his left foot goes,  there was a large wide area of necrotic tissue on the dorsum of the foot  almost on the entire dorsum of the foot, I used a #10 blade and removed  this entirely and got a good bleeding tissue underneath this with no  exposed bone.  However, in the extreme tip of the forefoot, there was  necrotic tissue and I had remove this with a rongeur as well as sharp  with the blade.  The deep compartments also appeared quite tenuous.  Once  good bleeding tissue was obtained, I used pulsatile lavage and  thoroughly irrigated out the wound with 4 L of normal saline solution.  I then placed a vac sponge over the wound to the pressure of 100 mm of  continuous suction and was found to have a good seal.  The patient was  then  awakened, extubated, and taken to recovery room in stable condition.  Postoperatively, he will continue convalesce in the acute care setting  and remain on antibiotics.  We will need to return to the operating room  on this coming Tuesday for repeat serial irrigation debridement and  assess the potential for skin graft.      Lind Guest. Ninfa Linden, M.D.  Electronically Signed     CYB/MEDQ  D:  08/03/2008  T:  08/03/2008  Job:  [id number]

## 2010-11-03 NOTE — Op Note (Signed)
Troy [name], Troy [name]                 ACCOUNT NO.:  [id number]   MEDICAL RECORD NO.:  [id number]          PATIENT TYPE:  INP   LOCATION:  5018                         FACILITY:  [facility]   PHYSICIAN:  Lind Guest. Ninfa Linden, M.D.DATE OF BIRTH:  07-25-1960   DATE OF PROCEDURE:  08/06/2008  DATE OF DISCHARGE:                               OPERATIVE REPORT   PREOPERATIVE DIAGNOSIS:  Left foot wound.   POSTOPERATIVE DIAGNOSIS:  Left foot wound.   PROCEDURES:  1. Irrigation and debridement of left foot wound.  2. Split-thickness skin graft to left foot from left thigh donor site      measuring 10 cm x 6 cm.   SURGEON:  Lind Guest. Ninfa Linden, MD   ANESTHESIA:  General.   BLOOD LOSS:  Minimal.   COMPLICATIONS:  None.   INDICATIONS:  Briefly, Troy [name] is a 50 year old, who 2 weeks ago  sustained a crushing injury to both his feet.  His left foot has  undergone amputation of the metatarsals of first, second, and third rays  and had significant sloughing of the soft tissue on the dorsum of the  foot.  He has had irrigation and debridement and now presents for repeat  irrigation and debridement with the possibility of skin grafting  depending on the nature of the tissue.  In the interim, he has developed  acute renal problems with a creatinine of 6 due to a high vancomycin  level and we have stopped the vancomycin and are watching his kidney  function closely.  The risks and benefits of the surgery have been  explained to him in length and he agrees to proceed with surgery.   PROCEDURE:  After informed consent was obtained, appropriate left foot  and thigh were marked.  He was brought to the operating room and placed  supine on the operating table.  General anesthesia was then obtained.  His left thigh was prepped with Hibiclens and his left foot and ankle  were prepped with Betadine scrub and paint.  A time-out was called to  identify the correct patient, correct left extremity and  foot.  I then  assessed the foot with the old VAC sponge removed and found excellent  granulation tissue and good bleeding tissue on the dorsum of the foot.  Deeply, he also had no further necrosis that I could see.  The skin was  also intact.  I then used pulsatile lavage and 3 L of normal saline  solution to completely clean the foot its entirety.  I was able to  advance the tissue from the plantar surface up to the dorsal surface and  secure this with interrupted 2-0 Vicryl suture.  Next, I repaired the  thigh for skin grafting.  Mineral oil was placed around the thigh and  then I used the size 3-inch blade on the Zimmer as the Zimmer dermatome.  I was able to harvest a graft at 0.14 mm thickness.  Epinephrine-soaked  sponge was then placed on the donor site and I replaced the skin graft  through a 1 to 1-1/2 mesh.  I was  then able to place the skin graft over  the entire dorsal foot wound.  I secured the skin graft then with  interrupted 4-0 Monocryl suture.  I then placed Adaptic over the skin  graft followed by a medium VAC sponge and set the pressure at -75 mm of  continuous suction pressure.  Good seal was obtained.  I then removed  the epinephrine-soaked sponge from the donor site on the  thigh and placed OpSite over this.  Of note, the patient did make over  100 mL of urine during the case and this was clear.  I then placed Ace  wrap around the foot.  The patient was awakened, extubated, and taken to  the recovery room in stable condition.  All final counts were correct  and there were no complications noted.      Lind Guest. Ninfa Linden, M.D.  Electronically Signed     CYB/MEDQ  D:  08/06/2008  T:  08/07/2008  Job:  [id number]

## 2010-11-03 NOTE — Op Note (Signed)
[name], GAAL                 ACCOUNT NO.:  [id number]   MEDICAL RECORD NO.:  [id number]          PATIENT TYPE:  INP   LOCATION:  5018                         FACILITY:  [facility]   PHYSICIAN:  Lind Guest. Ninfa Linden, M.D.DATE OF BIRTH:  Aug 04, 1960   DATE OF PROCEDURE:  07/30/2008  DATE OF DISCHARGE:                               OPERATIVE REPORT   PREOPERATIVE DIAGNOSIS:  Bilateral foot crush injuries with ischemic  right great toe and ischemic left first, second, and third toes.   POSTOPERATIVE DIAGNOSIS:  Bilateral foot crush injuries with ischemic  right great toe and ischemic left first, second, and third toes.   PROCEDURES:  1. Left great toe amputation of the proximal phalanx.  2. Irrigation and debridement of left foot wounds.  3. Left foot first, second, and third ray amputation/resection to      metatarsals.   SURGEON:  Lind Guest. Ninfa Linden, MD   ANESTHESIA:  General.   ESTIMATED BLOOD LOSS:  Less than 100 mL.   COMPLICATIONS:  None.   INDICATIONS:  Briefly, Troy [name] is a 50 year old who 8 days ago had a  20-ton girder drop on his feet.  He did have stilted shoes on his feet,  but he was seen in the emergency room, he was found to have profound  ischemic changes to the left foot first, second, and third toes as well  as damage to the right foot great toe.  He had pulses in both feet, but  due to severe crushing injury, I admitted him to the hospital to watch  the soft tissues closely to see what demarcation was occurring and how  the feet and soft tissue would declare themselves.  After a week in  the hospital on the pain medications and observation, it appeared that  the left foot first, second, and third toes were severely necrotic and  had no blood flow.  He had a large eschar on the dorsum of his foot and  fracture blisters throughout its foot.  His right foot had ischemic  change to the tip of the great toe and a crushed distal phalanx of this  foot.  I  recommended he undergo exploration, irrigation, and debridement  of the left foot at minimum resection to the first to third rays and  then amputation of the right foot great toe.  The risks and benefits of  this were explained to him in length and well understood.  He agreed to  proceed with surgery.   PROCEDURE DESCRIPTION:  After informed consent was obtained and both  feet were marked, he was brought to the operating room and placed supine  on the operating table.  General anesthesia was obtained.  His both  feet, ankles, and legs were prepped with Betadine scrub and paint.  A  time-out was called to identify the correct patient and correct feet.  I  first addressed the right great toe.  He had a severe nail bed injury  and crushed distal phalanx.  There was large hematoma encountered as  well.  I resected the great toe through the proximal  phalanx and that is  where I finally did get bleeding tissue.  I removed the bone with a  rongeur and then fashioned a soft tissue flap with interrupted 2-0 nylon  suture.  Xeroform followed by well-padded sterile dressing was applied  to this wound.  I then turned my attention to the left foot which was  the more severely injured foot.  The first, second, and third toes were  all black.  I used a sharp blade and removed these all through the MTP  joints first.  There was a large hematoma encountered and necrotic  tissue throughout.  I removed soft tissue and epidermal layers of skin  on the dorsum of his foot that was near full thickness.  I then used a  rongeur and bone cutting forceps to cut back to the mid metatarsal area  of each of the first, second, and third rays.  I was then able to  thoroughly irrigate the tissue using pulsatile lavage and 3 L of normal  saline solution.  Using 2-0 nylon suture, I then fashioned a post  plantar flap to come out dorsally to get a loose closure.  The skin  itself was still quite tenuous and I am concerned  about the viability of  the soft tissue on the dorsum of the foot.  Xeroform followed by well-  padded sterile dressing was applied to this foot as well.  The patient  was then awakened, extubated, and taken to recovery room in stable  condition.  Postoperatively, we will continue to follow him closely for  further ischemic changes in his foot and I will keep him on antibiotics.      Lind Guest. Ninfa Linden, M.D.  Electronically Signed     CYB/MEDQ  D:  07/30/2008  T:  07/31/2008  Job:  [id number]

## 2010-11-06 NOTE — Cardiovascular Report (Signed)
[place]  Patient:    Troy [name], Troy [name]                        MRN: [id number] Proc. Date: 09/01/00 Adm. Date:  [id number] Attending:  Christy Sartorius CC:         [name], M.D. [facility]  Osker Mason, M.D., [employer]   Cardiac Catheterization  PROCEDURES PERFORMED: 1. Right heart catheterization. 2. Left heart catheterization. 3. Left ventriculogram. 4. Selective coronary angiography. 5. Perclose right femoral artery.  DIAGNOSES: 1. Trivial coronary artery disease by angiogram. 2. Normal left ventricular systolic function. 3. Normal right heart pressures and cardiac output.  INDICATIONS:  Troy [name] is a 50 year old gentleman, with a long history of tobacco use, who presents with progressive dyspnea on exertion and substernal chest discomfort.  He has also had lightheadedness.  He presents for definitive cardiac assessment with right and left heart catheterization.  TECHNIQUE:  After informed consent was obtained, the patient was brought to the cardiac catheterization lab.  A 6 French arterial and 8 French venous sheath were placed.  Right and left heart catheterization were then performed in the usual fashion using preformed Judkins catheters and a 7.5 [place] PA catheter.  At the termination of the case, a Perclose suture closure device was deployed in the right femoral artery.  Manual pressure was applied to the venous site.  Adequate hemostasis was achieved.  The patient tolerated the procedure well and was transferred to the ward in stable condition.  FINDINGS:  Findings are as follows: 1. Right heart catheterization:  RA equals 8/5, mean equals 4, RV equals    30/5, PA equals 24.10.  Pulmonary capillary wedge pressure was 10/13, mean    equals 8.  Cardiac output equals 6.1 L/min. with a cardiac index of 2.8    L/min. per sq m by thermodilution.  There is no mitral valve gradient. 2. Left heart catheterization:  Left  main trunk is a large caliber vessel,    angiographically normal. 3. LAD:  This is a medium caliber vessel that provides three small diagonal    branches.  There are luminal irregularities in the LAD system. 4. Left circumflex artery:  Left circumflex artery is dominant.  This is a    medium caliber vessel that provides several marginal branches in the mid    section as well as the small posterior descending artery terminally.  The    left circumflex system has luminal irregularities. 5. Ramus intermedius:  This is a bifurcating vessel that provides the    anterolateral wall.  It has luminal irregularities. 6. Right coronary artery is nondominant.  This vessel provides several    RV marginal branches.  There are luminal irregularities in the right    coronary system.  LEFT VENTRICULOGRAM:  Normal end-systolic and end-diastolic dimensions. Overall left ventricular function is well preserved, ejection fraction of greater than 65%.  No mitral regurgitation.  LV pressure is 125/5, aortic 125/80, LVEDP equals 16.  ASSESSMENT AND PLAN:  Troy [name] is a 50 year old gentleman with dyspnea on exertion and chest discomfort and of noncardiac nature.  Other causes of the discomfort will be investigated. DD:  09/01/00 TD:  09/01/00 Job: [id number]

## 2011-01-26 ENCOUNTER — Encounter: Payer: Self-pay | Admitting: Physician Assistant

## 2011-01-27 ENCOUNTER — Ambulatory Visit (INDEPENDENT_AMBULATORY_CARE_PROVIDER_SITE_OTHER): Payer: Medicare Other | Admitting: Physician Assistant

## 2011-01-27 ENCOUNTER — Encounter: Payer: Self-pay | Admitting: Physician Assistant

## 2011-01-27 VITALS — BP 134/76 | HR 97 | Ht 72.0 in | Wt 277.0 lb

## 2011-01-27 DIAGNOSIS — R9431 Abnormal electrocardiogram [ECG] [EKG]: Secondary | ICD-10-CM | POA: Insufficient documentation

## 2011-01-27 DIAGNOSIS — Z01810 Encounter for preprocedural cardiovascular examination: Secondary | ICD-10-CM | POA: Insufficient documentation

## 2011-01-27 DIAGNOSIS — I10 Essential (primary) hypertension: Secondary | ICD-10-CM | POA: Insufficient documentation

## 2011-01-27 DIAGNOSIS — R011 Cardiac murmur, unspecified: Secondary | ICD-10-CM

## 2011-01-27 DIAGNOSIS — E119 Type 2 diabetes mellitus without complications: Secondary | ICD-10-CM | POA: Insufficient documentation

## 2011-01-27 DIAGNOSIS — Z720 Tobacco use: Secondary | ICD-10-CM

## 2011-01-27 DIAGNOSIS — E785 Hyperlipidemia, unspecified: Secondary | ICD-10-CM

## 2011-01-27 DIAGNOSIS — F17200 Nicotine dependence, unspecified, uncomplicated: Secondary | ICD-10-CM

## 2011-01-27 DIAGNOSIS — R079 Chest pain, unspecified: Secondary | ICD-10-CM

## 2011-01-27 DIAGNOSIS — J449 Chronic obstructive pulmonary disease, unspecified: Secondary | ICD-10-CM | POA: Insufficient documentation

## 2011-01-27 NOTE — Assessment & Plan Note (Signed)
When compared to previous ECGs, this is unchanged.  However, as noted, I will have him undergo stress testing.

## 2011-01-27 NOTE — Patient Instructions (Addendum)
Your physician has requested that you have a [place] 01/28/11 AS PER SCOTT WEAVER, PA-C DX V72.81, 786.50, 794.31 PT IS HAVING SURGERY TO REMOVE KIDNEY STONE. For further information please visit [email or web address]. Please follow instruction sheet, as given.   Your physician has requested that you have an echocardiogram PLEASE SCHEDULE THIS FOR 01/28/11 AS PER SCOTT WEAVER, PA-C DX V72.81, 786.50, 794.31 PT IS HAVING SURGERY TO REMOVE KIDNEY STONE. [facility] Echocardiography is a painless test that uses sound waves to create images of your heart. It provides your doctor with information about the size and shape of your heart and how well your heart's chambers and valves are working. This procedure takes approximately one hour. There are no restrictions for this procedure.   Your physician wants you to follow-up in: [place]. You will receive a reminder letter in the mail two months in advance. If you don't receive a letter, please call our office to schedule the follow-up appointment.

## 2011-01-27 NOTE — Assessment & Plan Note (Signed)
He knows he needs to quit. 

## 2011-01-27 NOTE — Assessment & Plan Note (Signed)
Managed by PCP

## 2011-01-27 NOTE — Assessment & Plan Note (Signed)
He has multiple cardiac risk factors.  He cannot achieve 4 METS.  It is difficult to assess his functional status in terms of surgical clearance.  However, he has noted some dyspnea recently.  This may be multifactorial given his obesity, decreased mobility and smoking history.  His ECG changes have been documented previously.  There is no significant change.  Although his procedure is low risk, I would prefer he undergo stress testing to rule out significant ischemic heart disease prior to clearing him.

## 2011-01-27 NOTE — Assessment & Plan Note (Signed)
I do not see that this has been documented previously.  He had a fairly normal echo 3 years ago.  Schedule echo as well with his myoview.

## 2011-01-27 NOTE — Assessment & Plan Note (Signed)
Controlled.  Continue current therapy.  

## 2011-01-27 NOTE — Progress Notes (Addendum)
History of Present Illness: Primary Cardiologist:  Dr. [name]  Urologist:  Dr. Mick Sell  Troy [name] is a 50 y.o. male who is referred for surgical clearance.  He has a h/o cardiac cath in 2002 that demonstrated normal LVF and luminal irregularities.  He has a history of an abnormal EKG.  He has a history of diabetes, hyperlipidemia and hypertension.  He saw Dr. [name] in 2009 for surgical clearance.  At that time, he had an echocardiogram that demonstrated an EF of 70%, mild LVH, normal aortic valve, normal mitral valve.  Since that time, he suffered severe crush injuries to both feet at his job.  He is now on disability.  He said his left foot removed and part of his right foot removed.  Currently, he is not that active due to his limitations with his feet.  He recently developed some back pain and was found to have a very large kidney stone.  He needs to have laser lithotripsy for his stone.  He was scheduled at [facility] in [place], [place] last week.  However, this was canceled due to an abnormal EKG.  He denies chest pain.  As noted, he is not that active.  He has noted some dyspnea with exertion over the last few months.  This is fairly new for him.  He denies orthopnea, PND or significant lower extremity edema.  He denies syncope.  He has occasional palpitations.  He has noted these most of his life without significant change.  Past Medical History  Diagnosis Date  . Anxiety and depression   . Injuries      crushing injury to both his feet in February 2010.   Marland Kitchen DM2 (diabetes mellitus, type 2)   . Acute renal failure     in setting of NSAID use and orthopedic surgery 2010  . HTN (hypertension)   . HLD (hyperlipidemia)   . Tobacco abuse   . Family history of early CAD     Current Outpatient Prescriptions  Medication Sig Dispense Refill  . amitriptyline (ELAVIL) 150 MG tablet Take 150 mg by mouth at bedtime.        Marland Kitchen amLODipine (NORVASC) 5 MG tablet Take 5 mg by mouth 2 (two) times  daily.        Marland Kitchen glipiZIDE (GLUCOTROL) 5 MG tablet Take 5 mg by mouth every morning.        Marland Kitchen lisinopril (PRINIVIL,ZESTRIL) 10 MG tablet Take 10 mg by mouth daily.        Marland Kitchen LORazepam (ATIVAN) 1 MG tablet Take 1 mg by mouth every 8 (eight) hours as needed.        . metFORMIN (GLUCOPHAGE) 500 MG tablet Take 1 tablet in the am and 2 tablets in the pm       . oxyCODONE-acetaminophen (PERCOCET) 10-325 MG per tablet Take 1 tablet by mouth every 6 (six) hours as needed.        Marland Kitchen rOPINIRole (REQUIP) 1 MG tablet Take 1 mg by mouth at bedtime.        . simvastatin (ZOCOR) 20 MG tablet Take 20 mg by mouth at bedtime.        Marland Kitchen tiZANidine (ZANAFLEX) 4 MG capsule Take 4 mg by mouth 3 (three) times daily as needed.        . topiramate (TOPAMAX) 100 MG tablet Take 100 mg by mouth 3 (three) times daily as needed.          Allergies: Allergies  Allergen Reactions  . Exalamide   . Nucynta (Tapentadol Hydrochloride)     Throat swells    Social history:  Smoker  Family history:  His brother died of a myocardial infarction in his 62s recently  ROS:  Please see the history of present illness.  All other systems reviewed and negative.   Vital Signs: BP 134/76  Pulse 97  Ht 6' (1.829 m)  Wt 277 lb (125.646 kg)  BMI 37.57 kg/m2  PHYSICAL EXAM: Well nourished, well developed, in no acute distress HEENT: normal Neck: no JVD Vascular: No carotid bruits Cardiac:  normal S1, S2; RRR; 2/6 systolic murmur heard best at RUSB Lungs:  Decreased breath sounds bilaterally, no wheezing, rhonchi or rales Abd: soft, nontender, no hepatomegaly Ext: trace bilateral edema Skin: warm and dry Neuro:  CNs 2-12 intact, no focal abnormalities noted Psych: normal affect  EKG:  Sinus rhythm, heart rate 97, normal axis, T wave inversions in leads 1, 2, aVL, V3-V6; no significant change when compared to prior tracings  ASSESSMENT AND PLAN:

## 2011-01-27 NOTE — Assessment & Plan Note (Signed)
As noted, schedule myoview.

## 2011-01-28 ENCOUNTER — Ambulatory Visit (HOSPITAL_COMMUNITY): Payer: Medicare Other | Attending: Cardiology | Admitting: Radiology

## 2011-01-28 ENCOUNTER — Ambulatory Visit (HOSPITAL_COMMUNITY): Payer: Medicare Other | Attending: Internal Medicine | Admitting: Radiology

## 2011-01-28 DIAGNOSIS — Z01810 Encounter for preprocedural cardiovascular examination: Secondary | ICD-10-CM | POA: Insufficient documentation

## 2011-01-28 DIAGNOSIS — I10 Essential (primary) hypertension: Secondary | ICD-10-CM | POA: Insufficient documentation

## 2011-01-28 DIAGNOSIS — E785 Hyperlipidemia, unspecified: Secondary | ICD-10-CM | POA: Insufficient documentation

## 2011-01-28 DIAGNOSIS — R011 Cardiac murmur, unspecified: Secondary | ICD-10-CM

## 2011-01-28 DIAGNOSIS — E119 Type 2 diabetes mellitus without complications: Secondary | ICD-10-CM | POA: Insufficient documentation

## 2011-01-28 DIAGNOSIS — R079 Chest pain, unspecified: Secondary | ICD-10-CM

## 2011-01-28 DIAGNOSIS — F17200 Nicotine dependence, unspecified, uncomplicated: Secondary | ICD-10-CM | POA: Insufficient documentation

## 2011-01-28 DIAGNOSIS — R0989 Other specified symptoms and signs involving the circulatory and respiratory systems: Secondary | ICD-10-CM

## 2011-01-28 DIAGNOSIS — R9431 Abnormal electrocardiogram [ECG] [EKG]: Secondary | ICD-10-CM

## 2011-01-28 DIAGNOSIS — E669 Obesity, unspecified: Secondary | ICD-10-CM | POA: Insufficient documentation

## 2011-01-28 DIAGNOSIS — R0609 Other forms of dyspnea: Secondary | ICD-10-CM | POA: Insufficient documentation

## 2011-01-28 MED ORDER — REGADENOSON 0.4 MG/5ML IV SOLN
0.4000 mg | Freq: Once | INTRAVENOUS | Status: AC
  Administered 2011-01-28: 0.4 mg via INTRAVENOUS

## 2011-01-28 MED ORDER — TECHNETIUM TC 99M TETROFOSMIN IV KIT
10.6000 | PACK | Freq: Once | INTRAVENOUS | Status: AC | PRN
  Administered 2011-01-28: 11 via INTRAVENOUS

## 2011-01-28 MED ORDER — TECHNETIUM TC 99M TETROFOSMIN IV KIT
33.0000 | PACK | Freq: Once | INTRAVENOUS | Status: AC | PRN
  Administered 2011-01-28: 33 via INTRAVENOUS

## 2011-01-28 MED ORDER — PERFLUTREN PROTEIN A MICROSPH IV SUSP
2.0000 mL | Freq: Once | INTRAVENOUS | Status: AC
  Administered 2011-01-28: 2 mL via INTRAVENOUS

## 2011-01-28 NOTE — Progress Notes (Addendum)
[place] [address] [phone]  Cardiology Nuclear Med Study  Troy [name] is a 50 y.o. male [id number] 15-Feb-1961   Nuclear Med Background Indication for Stress Test:  Evaluation for Ischemia and pending Surgical Clearance for kidney stone History:2009  Echo-EF 70% and 2002 Heart Catheterization- Nml Cardiac Risk Factors: Family History - CAD, Hypertension, Lipids, NIDDM and Smoker  Symptoms:  DOE   Nuclear Pre-Procedure Caffeine/Decaff Intake:  None NPO After: 10:00pm   Lungs:  Clear IV 0.9% NS with Angio Cath:  22g  IV Site: L Hand  IV Started by:  Eliezer Lofts, EMT-P  Chest Size (in):  54 Cup Size: n/a  Height: 6' (1.829 m)  Weight:  276 lb (125.193 kg)  BMI:  Body mass index is 37.43 kg/(m^2). Tech Comments:  NA    Nuclear Med Study 1 or 2 day study: 1 day  Stress Test Type:  Lexiscan  Reading MD: Darlin Coco, MD  Order Authorizing Provider:  Dr. Lizbeth Bark, Richardson Dopp, PA  Resting Radionuclide: Technetium 54m Tetrofosmin  Resting Radionuclide Dose: 10.6 mCi   Stress Radionuclide:  Technetium 34m Tetrofosmin  Stress Radionuclide Dose: 33 mCi           Stress Protocol Rest HR: 105 Stress HR: 115  Rest BP: 121/70 Stress BP: 121/70  Exercise Time (min): n/a METS: n/a   Predicted Max HR: 170 bpm % Max HR: 67.65 bpm Rate Pressure Product: 13915   Dose of Adenosine (mg):  n/a Dose of Lexiscan: 0.4 mg  Dose of Atropine (mg): n/a Dose of Dobutamine: n/a mcg/kg/min (at max HR)  Stress Test Technologist: Crissie Figures, RN  Nuclear Technologist:  Charlton Amor, CNMT     Rest Procedure:  Myocardial perfusion imaging was performed at rest 45 minutes following the intravenous administration of Technetium 73m Tetrofosmin. Rest ECG: NSR with T wave abnormality, occ. PVC's  Stress Procedure:  The patient received IV Lexiscan 0.4 mg over 15-seconds.  Technetium 74m Tetrofosmin injected at 30-seconds.   There were no significant changes with Lexiscan.  Quantitative spect images were obtained after a 45 minute delay. Stress ECG: No significant change from baseline ECG  QPS Raw Data Images:  Normal; no motion artifact; normal heart/lung ratio. Stress Images:  Normal homogeneous uptake in all areas of the myocardium. Rest Images:  Normal homogeneous uptake in all areas of the myocardium. Subtraction (SDS):  No evidence of ischemia. Transient Ischemic Dilatation (Normal <1.22):  1.16 Lung/Heart Ratio (Normal <0.45):  .13  Quantitative Gated Spect Images QGS EDV:  104 ml QGS ESV:  49 ml QGS cine images:  NL LV Function; NL Wall Motion QGS EF: 52%  Impression Exercise Capacity:  Lexiscan with no exercise. BP Response:  Normal blood pressure response. Clinical Symptoms:  No chest pain. ECG Impression:  Baseline:  NSR  No change in inferolateral T wave inversion with stress. Comparison with Prior Nuclear Study: No images to compare  Overall Impression:  Normal stress nuclear study., There is normal motion and a normal ejection fraction., With stress there is no chest pain. and With stress there is no EKG change.  Physiologic apical thinning is present.  Darlin Coco    01/29/11 Please notify patient that Myoview is normal. He requires no further cardiac workup prior to noncardiac surgery. He should be at acceptable risk. Please send copy of stress test and my office visit note to his surgeon. Richardson Dopp, PA-C

## 2011-01-29 ENCOUNTER — Telehealth: Payer: Self-pay | Admitting: *Deleted

## 2011-01-29 NOTE — Progress Notes (Signed)
Nuclear report routed to [facility] and Dr. [name]. Troy [name], Troy [name]

## 2011-01-29 NOTE — Telephone Encounter (Signed)
Pt's wife is aware of echo results today. Troy [name]

## 2011-02-09 ENCOUNTER — Telehealth: Payer: Self-pay | Admitting: *Deleted

## 2011-02-09 NOTE — Telephone Encounter (Signed)
lmom myoview normal and faxed results to surgeron 01/29/11. Julaine Hua

## 2011-03-19 LAB — POCT RAPID STREP A: Streptococcus, Group A Screen (Direct): NEGATIVE

## 2011-05-09 ENCOUNTER — Encounter (HOSPITAL_COMMUNITY): Payer: Self-pay | Admitting: *Deleted

## 2011-05-09 ENCOUNTER — Emergency Department (HOSPITAL_COMMUNITY): Payer: Medicare Other

## 2011-05-09 ENCOUNTER — Emergency Department (HOSPITAL_COMMUNITY)
Admission: EM | Admit: 2011-05-09 | Discharge: 2011-05-09 | Disposition: A | Discharge status: Home / Self Care | Payer: Medicare Other | Attending: Emergency Medicine | Admitting: Emergency Medicine

## 2011-05-09 DIAGNOSIS — R112 Nausea with vomiting, unspecified: Secondary | ICD-10-CM | POA: Insufficient documentation

## 2011-05-09 DIAGNOSIS — F341 Dysthymic disorder: Secondary | ICD-10-CM | POA: Insufficient documentation

## 2011-05-09 DIAGNOSIS — R509 Fever, unspecified: Secondary | ICD-10-CM | POA: Insufficient documentation

## 2011-05-09 DIAGNOSIS — Z79899 Other long term (current) drug therapy: Secondary | ICD-10-CM | POA: Insufficient documentation

## 2011-05-09 DIAGNOSIS — E119 Type 2 diabetes mellitus without complications: Secondary | ICD-10-CM | POA: Insufficient documentation

## 2011-05-09 DIAGNOSIS — E785 Hyperlipidemia, unspecified: Secondary | ICD-10-CM | POA: Insufficient documentation

## 2011-05-09 DIAGNOSIS — R1031 Right lower quadrant pain: Secondary | ICD-10-CM | POA: Insufficient documentation

## 2011-05-09 DIAGNOSIS — I10 Essential (primary) hypertension: Secondary | ICD-10-CM | POA: Insufficient documentation

## 2011-05-09 HISTORY — DX: Calculus of kidney: N20.0

## 2011-05-09 LAB — URINALYSIS, ROUTINE W REFLEX MICROSCOPIC
Bilirubin Urine: NEGATIVE
Glucose, UA: NEGATIVE mg/dL
Hgb urine dipstick: NEGATIVE
Ketones, ur: NEGATIVE mg/dL
Leukocytes, UA: NEGATIVE
Nitrite: NEGATIVE
Protein, ur: NEGATIVE mg/dL
Specific Gravity, Urine: 1.013 (ref 1.005–1.030)
Urobilinogen, UA: 0.2 mg/dL (ref 0.0–1.0)
pH: 7.5 (ref 5.0–8.0)

## 2011-05-09 LAB — DIFFERENTIAL
Eosinophils Absolute: 0.3 10*3/uL (ref 0.0–0.7)
Lymphocytes Relative: 21 % (ref 12–46)
Lymphs Abs: 2.8 10*3/uL (ref 0.7–4.0)
Monocytes Relative: 8 % (ref 3–12)
Neutro Abs: 9.2 10*3/uL — ABNORMAL HIGH (ref 1.7–7.7)
Neutrophils Relative %: 69 % (ref 43–77)

## 2011-05-09 LAB — CBC
Hemoglobin: 13.7 g/dL (ref 13.0–17.0)
MCH: 29.5 pg (ref 26.0–34.0)
Platelets: 217 10*3/uL (ref 150–400)
RBC: 4.65 MIL/uL (ref 4.22–5.81)
WBC: 13.4 10*3/uL — ABNORMAL HIGH (ref 4.0–10.5)

## 2011-05-09 LAB — COMPREHENSIVE METABOLIC PANEL
ALT: 30 U/L (ref 0–53)
Albumin: 3.8 g/dL (ref 3.5–5.2)
Alkaline Phosphatase: 91 U/L (ref 39–117)
Chloride: 96 mEq/L (ref 96–112)
GFR calc Af Amer: >90 mL/min (ref >90)
Glucose, Bld: 163 mg/dL — ABNORMAL HIGH (ref 70–99)
Potassium: 4.3 mEq/L (ref 3.5–5.1)
Sodium: 132 mEq/L — ABNORMAL LOW (ref 135–145)
Total Bilirubin: 0.4 mg/dL (ref 0.3–1.2)
Total Protein: 8 g/dL (ref 6.0–8.3)

## 2011-05-09 MED ORDER — SODIUM CHLORIDE 0.9 % IV SOLN: Freq: Once | INTRAVENOUS | Status: DC

## 2011-05-09 MED ORDER — SODIUM CHLORIDE 0.9 % IV BOLUS (SEPSIS)
1000.0000 mL | Freq: Once | INTRAVENOUS | Status: AC
  Administered 2011-05-09: 1000 mL via INTRAVENOUS

## 2011-05-09 MED ORDER — IOHEXOL 300 MG/ML  SOLN
100.0000 mL | Freq: Once | INTRAMUSCULAR | Status: AC | PRN
  Administered 2011-05-09: 100 mL via INTRAVENOUS

## 2011-05-09 MED ORDER — MORPHINE SULFATE 4 MG/ML IJ SOLN
4.0000 mg | Freq: Once | INTRAMUSCULAR | Status: AC
  Administered 2011-05-09: 4 mg via INTRAVENOUS
  Filled 2011-05-09: qty 1

## 2011-05-09 NOTE — ED Notes (Signed)
Pt c/o intermittent RUQ pain x 2 months. Last BM yesterday. Denies difficulty with BM, or voiding. Bowel sounds hypoactive.

## 2011-05-09 NOTE — ED Notes (Signed)
Patient with right sided lower abdominal pain for about six weeks and has been progressively getting worse. Patient has normal bowel movement and urinating without any problems

## 2011-05-09 NOTE — ED Notes (Signed)
Family at bedside. 

## 2011-05-09 NOTE — ED Provider Notes (Signed)
History     CSN: [id number] Arrival date & time: 05/09/2011 12:21 AM   First MD Initiated Contact with Patient 05/09/11 0123      Chief Complaint  Patient presents with  . Abdominal Pain    (Consider location/radiation/quality/duration/timing/severity/associated sxs/prior treatment) Patient is a 50 y.o. male presenting with abdominal pain. The history is provided by the patient.  Abdominal Pain The primary symptoms of the illness include abdominal pain, fever, nausea and vomiting. The primary symptoms of the illness do not include diarrhea. The current episode started more than 2 days ago. The onset of the illness was gradual.  Symptoms associated with the illness do not include chills, diaphoresis, constipation, urgency, hematuria or back pain.   Patient presents with intermittent abdominal pain for about the past 2 months. It has been progressively worsening. The pain seems to be located in the right lower quadrant and is sharp in nature. There are no known aggravating or relieving factors. He has had some accompanying nausea and vomited once today. Per his wife, he was febrile at home earlier today. He denies any change in bowel movements, rectal bleeding. He has had no urinary symptoms or urethral discharge.  Past Medical History  Diagnosis Date  . Anxiety and depression   . Injuries      crushing injury to both his feet in February 2010.   Marland Kitchen DM2 (diabetes mellitus, type 2)   . Acute renal failure     in setting of NSAID use and orthopedic surgery 2010  . HTN (hypertension)   . HLD (hyperlipidemia)   . Tobacco abuse   . Family history of early CAD   . Kidney calculi     Past Surgical History  Procedure Date  . Foot amputation     bilateral  . Tonsillectomy   . Lithotripsy     Family History  Problem Relation Age of Onset  . Lung cancer Father 40    died  . Leukemia Mother 32    died    History  Substance Use Topics  . Smoking status: Current Everyday Smoker  -- 0.5 packs/day    Types: Cigarettes  . Smokeless tobacco: Not on file  . Alcohol Use: Yes      Review of Systems  Constitutional: Positive for fever. Negative for chills, diaphoresis, activity change and appetite change.  HENT: Negative.   Eyes: Negative.   Respiratory: Negative.   Cardiovascular: Negative.   Gastrointestinal: Positive for nausea, vomiting and abdominal pain. Negative for diarrhea, constipation, blood in stool, abdominal distention, anal bleeding and rectal pain.  Genitourinary: Negative for urgency, hematuria, flank pain, difficulty urinating and testicular pain.  Musculoskeletal: Negative for myalgias and back pain.  Skin: Negative for color change and rash.  Neurological: Negative for dizziness, weakness, light-headedness, numbness and headaches.    Allergies  Exalamide and Nucynta  Home Medications   Current Outpatient Rx  Name Route Sig Dispense Refill  . AMITRIPTYLINE HCL 150 MG PO TABS Oral Take 150 mg by mouth at bedtime.      Marland Kitchen AMLODIPINE BESYLATE 5 MG PO TABS Oral Take 5 mg by mouth 2 (two) times daily.      Marland Kitchen GLIPIZIDE ER 5 MG PO TB24 Oral Take 5 mg by mouth daily.      Marland Kitchen LEVETIRACETAM 250 MG PO TABS Oral Take 1,000 mg by mouth every 12 (twelve) hours.      Marland Kitchen LISINOPRIL 10 MG PO TABS Oral Take 10 mg by mouth daily.      Marland Kitchen  LORAZEPAM 1 MG PO TABS Oral Take 1 mg by mouth every 8 (eight) hours as needed. anxiety    . METFORMIN HCL 500 MG PO TABS  Take 1 tablet in the am and 2 tablets in the pm     . OXYCODONE-ACETAMINOPHEN 10-325 MG PO TABS Oral Take 1 tablet by mouth every 6 (six) hours as needed. pain    . ROPINIROLE HCL 1 MG PO TABS Oral Take 1 mg by mouth at bedtime.      Marland Kitchen SIMVASTATIN 20 MG PO TABS Oral Take 20 mg by mouth at bedtime.      Marland Kitchen TIZANIDINE HCL 4 MG PO CAPS Oral Take 4-12 mg by mouth at bedtime.       BP 152/71  Pulse 122  Temp(Src) 98.1 F (36.7 C) (Oral)  Resp 24  Ht 6' (1.829 m)  Wt 274 lb (124.286 kg)  BMI 37.16 kg/m2   SpO2 93%  Physical Exam  Nursing note and vitals reviewed. Constitutional: He appears well-developed and well-nourished. No distress.  HENT:  Head: Normocephalic and atraumatic.  Right Ear: External ear normal.  Left Ear: External ear normal.  Mouth/Throat: Oropharynx is clear and moist.  Eyes: Conjunctivae are normal. Pupils are equal, round, and reactive to light.  Neck: Normal range of motion. Neck supple.  Cardiovascular: Normal rate, regular rhythm and normal heart sounds.   Pulmonary/Chest: Effort normal and breath sounds normal.  Abdominal: Soft. Bowel sounds are normal. He exhibits no distension and no mass. There is tenderness in the right lower quadrant. There is no rebound, no guarding and no tenderness at McBurney's point.       Obese abdomen. RLQ tenderness mild.   Skin: He is not diaphoretic.    ED Course  Procedures (including critical care time)  Labs Reviewed  CBC - Abnormal; Notable for the following:    WBC 13.4 (*)    All other components within normal limits  DIFFERENTIAL - Abnormal; Notable for the following:    Neutro Abs 9.2 (*)    Monocytes Absolute 1.1 (*)    All other components within normal limits  COMPREHENSIVE METABOLIC PANEL - Abnormal; Notable for the following:    Sodium 132 (*)    Glucose, Bld 163 (*)    All other components within normal limits  URINALYSIS, ROUTINE W REFLEX MICROSCOPIC  LIPASE, BLOOD  LAB REPORT - SCANNED   No results found.   1. Abdominal pain       MDM  Pt appears nontoxic, NAD. Intermittent RLQ pain x 2 mos. Occasional n/v and some subjective fever at home today. Labs relatively unremarkable; CT abd/pelvis unremarkable for acute abdomen. Pt was instructed to f/u with PCP next week regarding his sx. He was instructed re: warning s/sx that would prompt return visit to the dept. He verbalized understanding and agreed to plan.        Abran Richard, [place] 05/10/11 1241

## 2011-05-09 NOTE — ED Notes (Signed)
Patient is resting comfortably. 

## 2011-05-12 NOTE — ED Provider Notes (Signed)
Evaluation and management procedures were performed by the mid-level provider (PA/NP/CNM) under my supervision/collaboration. I was present and available during the ED course. Gearl Baratta Y.   Saddie Benders. Kallum Jorgensen, MD 05/12/11 1011

## 2011-06-18 ENCOUNTER — Other Ambulatory Visit: Payer: Self-pay | Admitting: Ophthalmology

## 2012-07-04 DIAGNOSIS — G8921 Chronic pain due to trauma: Secondary | ICD-10-CM | POA: Insufficient documentation

## 2012-07-04 DIAGNOSIS — G90529 Complex regional pain syndrome I of unspecified lower limb: Secondary | ICD-10-CM | POA: Insufficient documentation

## 2013-05-09 ENCOUNTER — Other Ambulatory Visit: Payer: Self-pay | Admitting: Dermatology

## 2015-11-28 ENCOUNTER — Ambulatory Visit (INDEPENDENT_AMBULATORY_CARE_PROVIDER_SITE_OTHER): Payer: Medicare Other | Admitting: Internal Medicine

## 2015-11-28 ENCOUNTER — Encounter: Payer: Self-pay | Admitting: Internal Medicine

## 2015-11-28 VITALS — BP 100/70 | HR 98 | Ht 72.0 in | Wt 254.0 lb

## 2015-11-28 DIAGNOSIS — R011 Cardiac murmur, unspecified: Secondary | ICD-10-CM | POA: Diagnosis not present

## 2015-11-28 DIAGNOSIS — R0602 Shortness of breath: Secondary | ICD-10-CM

## 2015-11-28 DIAGNOSIS — Z01810 Encounter for preprocedural cardiovascular examination: Secondary | ICD-10-CM

## 2015-11-28 DIAGNOSIS — E785 Hyperlipidemia, unspecified: Secondary | ICD-10-CM | POA: Diagnosis not present

## 2015-11-28 DIAGNOSIS — R079 Chest pain, unspecified: Secondary | ICD-10-CM

## 2015-11-28 MED ORDER — ASPIRIN EC 81 MG PO TBEC: 81.0000 mg | DELAYED_RELEASE_TABLET | Freq: Every day | ORAL | Status: DC

## 2015-11-28 NOTE — Progress Notes (Signed)
Cardiology Office Note   Date:  11/28/2015   ID:  Troy [name], DOB Oct 07, 1960, MRN [id number]  PCP:  Troy Mustache, MD  Cardiologist:   [name], MD   F/U of CAD     History of Present Illness: Troy [name] is a 55 y.o. male with a history of  CAD  Remote cath in 2002  Normal LVEF and luminal irregulriets  Abnormal EKG  DM  HL and HTN    Follows by Dr Edrick Oh  CP  Comes and goes   Irregular HB  Sometimes heart hurts  Gets SOB  Does smoke 1 ppd   [place] pressure with walking   Usually with activity   No PND   Spells occuring 1 x ever ymonth    LDL s 60  In Feb 2017      Outpatient Prescriptions Prior to Visit  Medication Sig Dispense Refill  . amitriptyline (ELAVIL) 150 MG tablet Take 150 mg by mouth at bedtime.      Marland Kitchen amLODipine (NORVASC) 5 MG tablet Take 5 mg by mouth 2 (two) times daily.      Marland Kitchen glipiZIDE (GLUCOTROL XL) 5 MG 24 hr tablet Take 5 mg by mouth daily.      Marland Kitchen levETIRAcetam (KEPPRA) 250 MG tablet Take 1,000 mg by mouth every 12 (twelve) hours.      Marland Kitchen lisinopril (PRINIVIL,ZESTRIL) 10 MG tablet Take 10 mg by mouth daily.      Marland Kitchen LORazepam (ATIVAN) 1 MG tablet Take 1 mg by mouth every 8 (eight) hours as needed. anxiety    . metFORMIN (GLUCOPHAGE) 500 MG tablet Take 1 tablet in the am and 2 tablets in the pm     . oxyCODONE-acetaminophen (PERCOCET) 10-325 MG per tablet Take 1 tablet by mouth every 6 (six) hours as needed. pain    . simvastatin (ZOCOR) 20 MG tablet Take 20 mg by mouth at bedtime.      Marland Kitchen tiZANidine (ZANAFLEX) 4 MG capsule Take 4-12 mg by mouth at bedtime.     Marland Kitchen rOPINIRole (REQUIP) 1 MG tablet Take 1 mg by mouth at bedtime. Reported on 11/28/2015     No facility-administered medications prior to visit.     Allergies:   Hydromorphone hcl er; Exalamide; Tapentadol; and Nucynta   Past Medical History  Diagnosis Date  . Anxiety and depression   . Injuries      crushing injury to both his feet in February 2010.   Marland Kitchen DM2 (diabetes mellitus,  type 2) ([place])   . Acute renal failure ([place])     in setting of NSAID use and orthopedic surgery 2010  . HTN (hypertension)   . HLD (hyperlipidemia)   . Tobacco abuse   . Family history of early CAD   . Kidney calculi     Past Surgical History  Procedure Laterality Date  . Foot amputation      bilateral  . Tonsillectomy    . Lithotripsy       Social History:  The patient  reports that he has been smoking Cigarettes.  He has been smoking about 0.50 packs per day. He does not have any smokeless tobacco history on file. He reports that he drinks alcohol. He reports that he does not use illicit drugs.   Family History:  The patient's family history includes Heart attack (age of onset: 83) in his brother; Heart attack (age of onset: 67) in his brother; Hypertension in his brother and sister; Leukemia (age  of onset: 28) in his mother; Lung cancer (age of onset: 65) in his father.    ROS:  Please see the history of present illness. All other systems are reviewed and  Negative to the above problem except as noted.    PHYSICAL EXAM: VS:  BP 100/70 mmHg  Pulse 98  Ht 6' (1.829 m)  Wt 254 lb (115.214 kg)  BMI 34.44 kg/m2  GEN  Obese 54 yo in no acute distress HEENT: normal Neck: no JVD, carotid bruits, or masses Cardiac: RRR; Gr II/VI sytolic murmur LSB  , rubs, or gallops,Tr edema  Respiratory:  clear to auscultation bilaterally, normal work of breathing GI: soft, nontender, nondistended, + BS  No hepatomegaly  MS: no deformity Moving all extremities   Skin: warm and dry, no rash Neuro:  Strength and sensation are intact Psych: euthymic mood, full affect   EKG:  EKG is ordered today.  SR 98 bpm  T wave inversion I, II, A$ to V6  AVL    Lipid Panel No results found for: CHOL, TRIG, HDL, CHOLHDL, VLDL, LDLCALC, LDLDIRECT    Wt Readings from Last 3 Encounters:  11/28/15 254 lb (115.214 kg)  05/09/11 274 lb (124.286 kg)  01/28/11 276 lb (125.193 kg)      ASSESSMENT AND  PLAN:   1  Cp  Atypical  But history of DM and tob abuse  Will set up for echo to eval LVEF  If normal then sched stress test    If abnormal possible cath  2  Murmur  Echo.to evaluate    3  HTN  Follow  [place] control   Signed, [name], MD  11/28/2015 3:02 PM    [facility] [place], [place], [place]  [address] Phone: [phone]; Fax: [phone]

## 2015-11-28 NOTE — Patient Instructions (Addendum)
Medication Instructions:  Start ASA 81 mg a day.  Continue all other medications as listed.  Testing/Procedures: Your physician has requested that you have an echocardiogram. Echocardiography is a painless test that uses sound waves to create images of your heart. It provides your doctor with information about the size and shape of your heart and how well your heart's chambers and valves are working. This procedure takes approximately one hour. There are no restrictions for this procedure.  Follow-Up: Follow up will be based on the results of the above testing.   If you need a refill on your cardiac medications before your next appointment, please call your pharmacy.   Thank you for choosing [facility]!!

## 2015-12-16 ENCOUNTER — Other Ambulatory Visit: Payer: Self-pay

## 2015-12-16 ENCOUNTER — Ambulatory Visit (HOSPITAL_COMMUNITY): Payer: Medicare Other | Attending: Cardiology

## 2015-12-16 DIAGNOSIS — R079 Chest pain, unspecified: Secondary | ICD-10-CM | POA: Diagnosis not present

## 2015-12-16 DIAGNOSIS — I517 Cardiomegaly: Secondary | ICD-10-CM | POA: Insufficient documentation

## 2015-12-16 DIAGNOSIS — I059 Rheumatic mitral valve disease, unspecified: Secondary | ICD-10-CM | POA: Diagnosis not present

## 2015-12-16 DIAGNOSIS — R011 Cardiac murmur, unspecified: Secondary | ICD-10-CM | POA: Diagnosis not present

## 2015-12-16 DIAGNOSIS — R0602 Shortness of breath: Secondary | ICD-10-CM | POA: Diagnosis not present

## 2015-12-16 DIAGNOSIS — I071 Rheumatic tricuspid insufficiency: Secondary | ICD-10-CM | POA: Insufficient documentation

## 2015-12-16 LAB — ECHOCARDIOGRAM COMPLETE
AVLVOTPG: 7 mmHg
E decel time: 208 msec
EERAT: 16.33
FS: 48 % — AB (ref 28–44)
IV/PV OW: 1.12
LA diam end sys: 36 mm
LA diam index: 1.53 cm/m2
LA vol index: 24.2 mL/m2
LA vol: 57 mL
LASIZE: 36 mm
LAVOLA4C: 61 mL
LV E/e' medial: 16.33
LV TDI E'MEDIAL: 4.93
LVEEAVG: 16.33
LVELAT: 5.26 cm/s
LVOT SV: 108 mL
LVOT VTI: 26.1 cm
LVOT area: 4.15 cm2
LVOT diameter: 23 mm
LVOTPV: 136 cm/s
MV Dec: 208
MV Peak grad: 3 mmHg
MV pk E vel: 85.9 m/s
MVPKAVEL: 88.4 m/s
PW: 16.6 mm — AB (ref 0.6–1.1)
Reg peak vel: 235 cm/s
TDI e' lateral: 5.26
TR max vel: 235 cm/s

## 2016-01-02 ENCOUNTER — Other Ambulatory Visit: Payer: Self-pay | Admitting: *Deleted

## 2016-01-02 DIAGNOSIS — R0602 Shortness of breath: Secondary | ICD-10-CM

## 2016-01-02 DIAGNOSIS — R079 Chest pain, unspecified: Secondary | ICD-10-CM

## 2016-01-13 ENCOUNTER — Telehealth (HOSPITAL_COMMUNITY): Payer: Self-pay | Admitting: *Deleted

## 2016-01-13 NOTE — Telephone Encounter (Signed)
Left message on voicemail per DPR in reference to upcoming appointment scheduled on 01/15/16 at 0945 with detailed instructions given per Myocardial Perfusion Study Information Sheet for the test. LM to arrive 15 minutes early, and that it is imperative to arrive on time for appointment to keep from having the test rescheduled. If you need to cancel or reschedule your appointment, please call the office within 24 hours of your appointment. Failure to do so may result in a cancellation of your appointment, and a $50 no show fee. Phone number given for call back for any questions.

## 2016-01-15 ENCOUNTER — Ambulatory Visit (HOSPITAL_COMMUNITY): Payer: Medicare Other | Attending: Cardiology

## 2016-01-15 ENCOUNTER — Other Ambulatory Visit: Payer: Medicare Other | Admitting: *Deleted

## 2016-01-15 ENCOUNTER — Encounter (INDEPENDENT_AMBULATORY_CARE_PROVIDER_SITE_OTHER): Payer: Self-pay

## 2016-01-15 DIAGNOSIS — I779 Disorder of arteries and arterioles, unspecified: Secondary | ICD-10-CM | POA: Insufficient documentation

## 2016-01-15 DIAGNOSIS — R0602 Shortness of breath: Secondary | ICD-10-CM

## 2016-01-15 DIAGNOSIS — I10 Essential (primary) hypertension: Secondary | ICD-10-CM | POA: Diagnosis not present

## 2016-01-15 DIAGNOSIS — R9439 Abnormal result of other cardiovascular function study: Secondary | ICD-10-CM | POA: Insufficient documentation

## 2016-01-15 DIAGNOSIS — R079 Chest pain, unspecified: Secondary | ICD-10-CM | POA: Insufficient documentation

## 2016-01-15 DIAGNOSIS — Z8249 Family history of ischemic heart disease and other diseases of the circulatory system: Secondary | ICD-10-CM | POA: Insufficient documentation

## 2016-01-15 LAB — BASIC METABOLIC PANEL
BUN: 17 mg/dL (ref 7–25)
CALCIUM: 8.7 mg/dL (ref 8.6–10.3)
CO2: 25 mmol/L (ref 20–31)
Chloride: 103 mmol/L (ref 98–110)
Creat: 0.85 mg/dL (ref 0.70–1.33)
GLUCOSE: 176 mg/dL — AB (ref 65–99)
POTASSIUM: 4 mmol/L (ref 3.5–5.3)
SODIUM: 138 mmol/L (ref 135–146)

## 2016-01-15 LAB — MYOCARDIAL PERFUSION IMAGING
CHL CUP NUCLEAR SRS: 10
CHL CUP RESTING HR STRESS: 83 {beats}/min
CSEPPHR: 95 {beats}/min
LV sys vol: 76 mL
LVDIAVOL: 133 mL (ref 62–150)
NUC STRESS TID: 1.14
RATE: 0.26
SDS: 5
SSS: 13

## 2016-01-15 LAB — CBC
HCT: 44.9 % (ref 38.5–50.0)
HEMOGLOBIN: 14.7 g/dL (ref 13.2–17.1)
MCH: 28.7 pg (ref 27.0–33.0)
MCHC: 32.7 g/dL (ref 32.0–36.0)
MCV: 87.5 fL (ref 80.0–100.0)
MPV: 9.7 fL (ref 7.5–12.5)
PLATELETS: 139 10*3/uL — AB (ref 140–400)
RBC: 5.13 MIL/uL (ref 4.20–5.80)
RDW: 14.4 % (ref 11.0–15.0)
WBC: 10 10*3/uL (ref 3.8–10.8)

## 2016-01-15 LAB — BRAIN NATRIURETIC PEPTIDE: BRAIN NATRIURETIC PEPTIDE: 30.9 pg/mL (ref <100)

## 2016-01-15 MED ORDER — TECHNETIUM TC 99M TETROFOSMIN IV KIT
32.8000 | PACK | Freq: Once | INTRAVENOUS | Status: AC | PRN
  Administered 2016-01-15: 32.8 via INTRAVENOUS
  Filled 2016-01-15: qty 33

## 2016-01-15 MED ORDER — TECHNETIUM TC 99M TETROFOSMIN IV KIT
10.4000 | PACK | Freq: Once | INTRAVENOUS | Status: AC | PRN
Start: 2016-01-15 — End: 2016-01-15
  Administered 2016-01-15: 10 via INTRAVENOUS
  Filled 2016-01-15: qty 10

## 2016-01-15 MED ORDER — REGADENOSON 0.4 MG/5ML IV SOLN
0.4000 mg | Freq: Once | INTRAVENOUS | Status: AC
  Administered 2016-01-15: 0.4 mg via INTRAVENOUS

## 2016-01-16 ENCOUNTER — Telehealth: Payer: Self-pay | Admitting: *Deleted

## 2016-01-16 NOTE — Telephone Encounter (Signed)
Spoke with patient.  Left heart cath scheduled for Thursday 01/22/16 at 9:00 am.  Arrive at 7:30 am.  Dr. [place]. He asked that I call and speak with his wife to provide instructions. Advised I will call her on Monday on the mobile #.

## 2016-01-16 NOTE — Telephone Encounter (Signed)
-----   Message from Fay Records, MD sent at 01/16/2016  3:29 PM EDT ----- Spoke to pt's wife   Stress test is not normal Underside of heart  Suggestion for ischmia   LV function on echo is normal  Only way to fnd out what is going on is with heart catheterization   Pt has had in 2002  Wwould recomm repeat Pt's wife said he had some chest discomfort on Monday   WIll need to call back to scheule day

## 2016-01-19 NOTE — Telephone Encounter (Signed)
Called and spoke with patient's wife, informed of all cath instructions. She knows he should hold metformin 24 hrs before and 48 hrs after procedure.  He will take his last dose Tuesday evening.  Verbalizes understanding of all instructions. Advised to call back with any questions/concerns.

## 2016-01-22 ENCOUNTER — Other Ambulatory Visit: Payer: Self-pay | Admitting: Cardiology

## 2016-01-22 ENCOUNTER — Encounter (HOSPITAL_COMMUNITY): Payer: Self-pay | Admitting: Cardiology

## 2016-01-22 ENCOUNTER — Encounter (HOSPITAL_COMMUNITY)
Admission: RE | Disposition: A | Discharge status: Home / Self Care | Payer: Self-pay | Source: Ambulatory Visit | Attending: Cardiology

## 2016-01-22 ENCOUNTER — Ambulatory Visit (HOSPITAL_COMMUNITY)
Admission: RE | Admit: 2016-01-22 | Discharge: 2016-01-22 | Disposition: A | Discharge status: Home / Self Care | Payer: Medicare Other | Source: Ambulatory Visit | Attending: Cardiology | Admitting: Cardiology

## 2016-01-22 DIAGNOSIS — Z7982 Long term (current) use of aspirin: Secondary | ICD-10-CM | POA: Insufficient documentation

## 2016-01-22 DIAGNOSIS — R9439 Abnormal result of other cardiovascular function study: Secondary | ICD-10-CM | POA: Diagnosis present

## 2016-01-22 DIAGNOSIS — Z8249 Family history of ischemic heart disease and other diseases of the circulatory system: Secondary | ICD-10-CM | POA: Diagnosis not present

## 2016-01-22 DIAGNOSIS — E119 Type 2 diabetes mellitus without complications: Secondary | ICD-10-CM | POA: Diagnosis not present

## 2016-01-22 DIAGNOSIS — Z89431 Acquired absence of right foot: Secondary | ICD-10-CM | POA: Insufficient documentation

## 2016-01-22 DIAGNOSIS — R079 Chest pain, unspecified: Secondary | ICD-10-CM | POA: Diagnosis present

## 2016-01-22 DIAGNOSIS — E785 Hyperlipidemia, unspecified: Secondary | ICD-10-CM | POA: Diagnosis present

## 2016-01-22 DIAGNOSIS — F329 Major depressive disorder, single episode, unspecified: Secondary | ICD-10-CM | POA: Diagnosis not present

## 2016-01-22 DIAGNOSIS — Z7984 Long term (current) use of oral hypoglycemic drugs: Secondary | ICD-10-CM | POA: Diagnosis not present

## 2016-01-22 DIAGNOSIS — R931 Abnormal findings on diagnostic imaging of heart and coronary circulation: Secondary | ICD-10-CM

## 2016-01-22 DIAGNOSIS — Z801 Family history of malignant neoplasm of trachea, bronchus and lung: Secondary | ICD-10-CM | POA: Insufficient documentation

## 2016-01-22 DIAGNOSIS — F17210 Nicotine dependence, cigarettes, uncomplicated: Secondary | ICD-10-CM | POA: Diagnosis not present

## 2016-01-22 DIAGNOSIS — Z87442 Personal history of urinary calculi: Secondary | ICD-10-CM | POA: Diagnosis not present

## 2016-01-22 DIAGNOSIS — Z89432 Acquired absence of left foot: Secondary | ICD-10-CM | POA: Diagnosis not present

## 2016-01-22 DIAGNOSIS — R0609 Other forms of dyspnea: Secondary | ICD-10-CM | POA: Insufficient documentation

## 2016-01-22 DIAGNOSIS — I10 Essential (primary) hypertension: Secondary | ICD-10-CM | POA: Diagnosis not present

## 2016-01-22 DIAGNOSIS — J449 Chronic obstructive pulmonary disease, unspecified: Secondary | ICD-10-CM | POA: Diagnosis present

## 2016-01-22 DIAGNOSIS — Z9689 Presence of other specified functional implants: Secondary | ICD-10-CM | POA: Diagnosis present

## 2016-01-22 DIAGNOSIS — R072 Precordial pain: Secondary | ICD-10-CM | POA: Diagnosis not present

## 2016-01-22 DIAGNOSIS — F419 Anxiety disorder, unspecified: Secondary | ICD-10-CM | POA: Insufficient documentation

## 2016-01-22 DIAGNOSIS — Z720 Tobacco use: Secondary | ICD-10-CM | POA: Diagnosis present

## 2016-01-22 HISTORY — PX: CARDIAC CATHETERIZATION: SHX172

## 2016-01-22 LAB — GLUCOSE, CAPILLARY
GLUCOSE-CAPILLARY: 159 mg/dL — AB (ref 65–99)
Glucose-Capillary: 173 mg/dL — ABNORMAL HIGH (ref 65–99)

## 2016-01-22 LAB — PROTIME-INR
INR: 1.13
PROTHROMBIN TIME: 14.6 s (ref 11.4–15.2)

## 2016-01-22 SURGERY — LEFT HEART CATH AND CORONARY ANGIOGRAPHY

## 2016-01-22 MED ORDER — SODIUM CHLORIDE 0.9% FLUSH: 3.0000 mL | INTRAVENOUS | Status: DC | PRN

## 2016-01-22 MED ORDER — HEPARIN SODIUM (PORCINE) 1000 UNIT/ML IJ SOLN
INTRAMUSCULAR | Status: AC
  Filled 2016-01-22: qty 1

## 2016-01-22 MED ORDER — FUROSEMIDE 20 MG PO TABS: 20.0000 mg | ORAL_TABLET | Freq: Every day | ORAL | 3 refills | Status: DC

## 2016-01-22 MED ORDER — HEPARIN SODIUM (PORCINE) 1000 UNIT/ML IJ SOLN
INTRAMUSCULAR | Status: DC | PRN
  Administered 2016-01-22: 5500 [IU] via INTRAVENOUS

## 2016-01-22 MED ORDER — SODIUM CHLORIDE 0.9% FLUSH: 3.0000 mL | Freq: Two times a day (BID) | INTRAVENOUS | Status: DC

## 2016-01-22 MED ORDER — MIDAZOLAM HCL 2 MG/2ML IJ SOLN
INTRAMUSCULAR | Status: AC
  Filled 2016-01-22: qty 2

## 2016-01-22 MED ORDER — SODIUM CHLORIDE 0.9 % WEIGHT BASED INFUSION
3.0000 mL/kg/h | INTRAVENOUS | Status: AC
  Administered 2016-01-22: 3 mL/kg/h via INTRAVENOUS

## 2016-01-22 MED ORDER — IOPAMIDOL (ISOVUE-370) INJECTION 76%
INTRAVENOUS | Status: DC | PRN
  Administered 2016-01-22: 90 mL via INTRA_ARTERIAL

## 2016-01-22 MED ORDER — SODIUM CHLORIDE 0.9 % IV SOLN: 250.0000 mL | INTRAVENOUS | Status: DC | PRN

## 2016-01-22 MED ORDER — IOPAMIDOL (ISOVUE-370) INJECTION 76%
INTRAVENOUS | Status: AC
  Filled 2016-01-22: qty 100

## 2016-01-22 MED ORDER — HEPARIN (PORCINE) IN NACL 2-0.9 UNIT/ML-% IJ SOLN
INTRAMUSCULAR | Status: DC | PRN
  Administered 2016-01-22: 1500 mL

## 2016-01-22 MED ORDER — METFORMIN HCL 500 MG PO TABS: 1000.0000 mg | ORAL_TABLET | Freq: Two times a day (BID) | ORAL | Status: DC

## 2016-01-22 MED ORDER — VERAPAMIL HCL 2.5 MG/ML IV SOLN
INTRAVENOUS | Status: DC | PRN
  Administered 2016-01-22: 10 mL via INTRA_ARTERIAL

## 2016-01-22 MED ORDER — VERAPAMIL HCL 2.5 MG/ML IV SOLN
INTRAVENOUS | Status: AC
  Filled 2016-01-22: qty 2

## 2016-01-22 MED ORDER — FENTANYL CITRATE (PF) 100 MCG/2ML IJ SOLN
INTRAMUSCULAR | Status: AC
  Filled 2016-01-22: qty 2

## 2016-01-22 MED ORDER — POTASSIUM CHLORIDE CRYS ER 20 MEQ PO TBCR: 20.0000 meq | EXTENDED_RELEASE_TABLET | Freq: Every day | ORAL | 3 refills | Status: DC

## 2016-01-22 MED ORDER — FENTANYL CITRATE (PF) 100 MCG/2ML IJ SOLN
INTRAMUSCULAR | Status: DC | PRN
  Administered 2016-01-22: 25 ug via INTRAVENOUS

## 2016-01-22 MED ORDER — SODIUM CHLORIDE 0.9 % WEIGHT BASED INFUSION: 1.0000 mL/kg/h | INTRAVENOUS | Status: DC

## 2016-01-22 MED ORDER — ASPIRIN 81 MG PO CHEW: 81.0000 mg | CHEWABLE_TABLET | ORAL | Status: DC

## 2016-01-22 MED ORDER — LIDOCAINE HCL (PF) 1 % IJ SOLN
INTRAMUSCULAR | Status: DC | PRN
  Administered 2016-01-22: 2 mL via INTRADERMAL

## 2016-01-22 MED ORDER — LIDOCAINE HCL (PF) 1 % IJ SOLN
INTRAMUSCULAR | Status: AC
  Filled 2016-01-22: qty 30

## 2016-01-22 MED ORDER — ASPIRIN 81 MG PO CHEW
CHEWABLE_TABLET | ORAL | Status: AC
  Filled 2016-01-22: qty 1

## 2016-01-22 MED ORDER — MIDAZOLAM HCL 2 MG/2ML IJ SOLN
INTRAMUSCULAR | Status: DC | PRN
  Administered 2016-01-22: 1 mg via INTRAVENOUS

## 2016-01-22 MED ORDER — HEPARIN (PORCINE) IN NACL 2-0.9 UNIT/ML-% IJ SOLN
INTRAMUSCULAR | Status: AC
  Filled 2016-01-22: qty 1500

## 2016-01-22 SURGICAL SUPPLY — 11 items
CATH INFINITI 5 FR JL3.5 (CATHETERS) ×2 IMPLANT
CATH INFINITI 5FR ANG PIGTAIL (CATHETERS) ×2 IMPLANT
CATH INFINITI JR4 5F (CATHETERS) ×2 IMPLANT
DEVICE RAD COMP TR BAND LRG (VASCULAR PRODUCTS) ×2 IMPLANT
GLIDESHEATH SLEND SS 6F .021 (SHEATH) ×2 IMPLANT
KIT HEART LEFT (KITS) ×3 IMPLANT
PACK CARDIAC CATHETERIZATION (CUSTOM PROCEDURE TRAY) ×3 IMPLANT
SYR MEDRAD MARK V 150ML (SYRINGE) ×3 IMPLANT
TRANSDUCER W/STOPCOCK (MISCELLANEOUS) ×3 IMPLANT
TUBING CIL FLEX 10 FLL-RA (TUBING) ×3 IMPLANT
WIRE SAFE-T 1.5MM-J .035X260CM (WIRE) ×2 IMPLANT

## 2016-01-22 NOTE — Discharge Instructions (Signed)
Hold metformin for 48 hours  Add lasix 40 mg daily and potassium 20 meq daily  We will arrange follow up in our office. Radial Site Care Refer to this sheet in the next few weeks. These instructions provide you with information about caring for yourself after your procedure. Your health care provider may also give you more specific instructions. Your treatment has been planned according to current medical practices, but problems sometimes occur. Call your health care provider if you have any problems or questions after your procedure. WHAT TO EXPECT AFTER THE PROCEDURE After your procedure, it is typical to have the following:  Bruising at the radial site that usually fades within 1-2 weeks.  Blood collecting in the tissue (hematoma) that may be painful to the touch. It should usually decrease in size and tenderness within 1-2 weeks. HOME CARE INSTRUCTIONS  Take medicines only as directed by your health care provider.  You may shower 24-48 hours after the procedure or as directed by your health care provider. Remove the bandage (dressing) and gently wash the site with plain soap and water. Pat the area dry with a clean towel. Do not rub the site, because this may cause bleeding.  Do not take baths, swim, or use a hot tub until your health care provider approves.  Check your insertion site every day for redness, swelling, or drainage.  Do not apply powder or lotion to the site.  Do not flex or bend the affected arm for 24 hours or as directed by your health care provider.  Do not push or pull heavy objects with the affected arm for 24 hours or as directed by your health care provider.  Do not lift over 10 lb (4.5 kg) for 5 days after your procedure or as directed by your health care provider.  Ask your health care provider when it is okay to:  Return to work or school.  Resume usual physical activities or sports.  Resume sexual activity.  Do not drive home if you are discharged  the same day as the procedure. Have someone else drive you.  You may drive 24 hours after the procedure unless otherwise instructed by your health care provider.  Do not operate machinery or power tools for 24 hours after the procedure.  If your procedure was done as an outpatient procedure, which means that you went home the same day as your procedure, a responsible adult should be with you for the first 24 hours after you arrive home.  Keep all follow-up visits as directed by your health care provider. This is important. SEEK MEDICAL CARE IF:  You have a fever.  You have chills.  You have increased bleeding from the radial site. Hold pressure on the site. CALL 911 SEEK IMMEDIATE MEDICAL CARE IF:  You have unusual pain at the radial site.  You have redness, warmth, or swelling at the radial site.  You have drainage (other than a small amount of blood on the dressing) from the radial site.  The radial site is bleeding, and the bleeding does not stop after 30 minutes of holding steady pressure on the site.  Your arm or hand becomes pale, cool, tingly, or numb.   This information is not intended to replace advice given to you by your health care provider. Make sure you discuss any questions you have with your health care provider.   Document Released: 07/10/2010 Document Revised: 06/28/2014 Document Reviewed: 12/24/2013 Elsevier Interactive Patient Education [employer].

## 2016-01-22 NOTE — Research (Signed)
[place] Study Informed Consent   Subject Name: Troy [name]  Subject met inclusion and exclusion criteria.  The informed consent form, study requirements and expectations were reviewed with the subject and questions and concerns were addressed prior to the signing of the consent form.  The subject verbalized understanding of the trial requirements.  The subject agreed to participate in the trial and signed the informed consent.  The informed consent was obtained prior to performance of any protocol-specific procedures for the subject.  A copy of the signed informed consent was given to the subject and a copy was placed in the subject's medical record.  [name] 01/22/2016, 11:42 AM

## 2016-01-22 NOTE — H&P (Signed)
Physician History and Physical    Patient ID: Troy [name] MRN: [id number] DOB/AGE: [birth year] 55 y.o. Admit date: 01/22/2016  Primary Care Physician: [name], MD Primary Cardiologist [name] MD  HPI: 55 yo WM referred by Dr. [name] for coronary angiography. He has a history of tobacco abuse, DM2, HTN, and HL. He states that he is experiencing symptoms of substernal chest pain and dyspnea on exertion. He states this has been going on for at least six months and is relieved with rest. Admits that he is sedentary. He had an Echo that was unremarkable. A Myoview study was intermediate risk with inferior wall perfusion abnormality. Cardiac cath was recommended. He did have coronary angiography in 2002 which was normal.  Review of systems complete and found to be negative unless listed above  Past Medical History:  Diagnosis Date  . Acute renal failure ([place])    in setting of NSAID use and orthopedic surgery 2010  . Anxiety and depression   . DM2 (diabetes mellitus, type 2) ([place])   . Family history of early CAD   . HLD (hyperlipidemia)   . HTN (hypertension)   . Injuries     crushing injury to both his feet in February 2010.   Marland Kitchen Kidney calculi   . Tobacco abuse     Family History  Problem Relation Age of Onset  . Lung cancer Father 65    died  . Leukemia Mother 39    died  . Heart attack Brother 54  . Heart attack Brother 36  . Hypertension Brother     X3  . Hypertension Sister     X2    Social History   Social History  . Marital status: Married    Spouse name: N/A  . Number of children: N/A  . Years of education: N/A   Occupational History  . disable    Social History Main Topics  . Smoking status: Current Every Day Smoker    Packs/day: 0.50    Types: Cigarettes  . Smokeless tobacco: Not on file  . Alcohol use Yes  . Drug use: No  . Sexual activity: Not on file   Other Topics Concern  . Not on file   Social History Narrative  . No narrative on  file    Past Surgical History:  Procedure Laterality Date  . FOOT AMPUTATION     bilateral  . LITHOTRIPSY    . TONSILLECTOMY       Prescriptions Prior to Admission  Medication Sig Dispense Refill Last Dose  . amitriptyline (ELAVIL) 150 MG tablet Take 150 mg by mouth at bedtime.     01/20/2016  . amLODipine (NORVASC) 5 MG tablet Take 5 mg by mouth 2 (two) times daily.     01/20/2016  . aspirin EC 81 MG tablet Take 1 tablet (81 mg total) by mouth daily. 90 tablet 3 01/22/2016 at 0600  . dapagliflozin propanediol (FARXIGA) 10 MG TABS tablet Take 10 mg by mouth daily.   01/20/2016  . glipiZIDE (GLUCOTROL XL) 5 MG 24 hr tablet Take 5 mg by mouth daily.     01/20/2016  . hydrocortisone 2.5 % cream Apply 1 application topically 2 (two) times daily.   01/20/2016  . levETIRAcetam (KEPPRA) 250 MG tablet Take 1,000 mg by mouth every 12 (twelve) hours.     01/22/2016 at 0600  . LORazepam (ATIVAN) 1 MG tablet Take 1 mg by mouth every 8 (eight) hours as needed. anxiety   01/20/2016  .  losartan (COZAAR) 100 MG tablet Take 100 mg by mouth daily.   01/20/2016  . metFORMIN (GLUCOPHAGE) 500 MG tablet Take 1,000 mg by mouth 2 (two) times daily with a meal.    01/20/2016  . omeprazole (PRILOSEC) 40 MG capsule Take 40 mg by mouth daily.   01/22/2016 at 0600  . oxyCODONE-acetaminophen (PERCOCET) 10-325 MG per tablet Take 1 tablet by mouth every 6 (six) hours as needed. pain   01/21/2016 at Unknown time  . simvastatin (ZOCOR) 20 MG tablet Take 20 mg by mouth at bedtime.     01/20/2016  . tiZANidine (ZANAFLEX) 4 MG capsule Take 8-12 mg by mouth at bedtime.    01/20/2016  . traZODone (DESYREL) 50 MG tablet Take 50 mg by mouth at bedtime.    01/20/2016  . diclofenac sodium (VOLTAREN) 1 % GEL Apply 1 application topically 4 (four) times daily. AS NEEDED FOR PAIN   More than a month at Unknown time    Physical Exam: Blood pressure 130/75, pulse (!) 108, temperature 98.1 F (36.7 C), temperature source Oral, resp. rate 18, height 6' (1.829 m),  weight 250 lb (113.4 kg), SpO2 98 %.  Current Weight  01/22/16 250 lb (113.4 kg)  11/28/15 254 lb (115.2 kg)  05/09/11 274 lb (124.3 kg)    GENERAL:  Well appearing, obese WM in NAD HEENT:  PERRL, EOMI, sclera are clear. Oropharynx is clear. NECK:  No jugular venous distention, carotid upstroke brisk and symmetric, no bruits, no thyromegaly or adenopathy LUNGS:  Clear to auscultation bilaterally CHEST:  Unremarkable HEART:  RRR,  PMI not displaced or sustained,S1 and S2 within normal limits, no S3, no S4: no clicks, no rubs, soft systolic murmur at the apex. ABD:  Soft, nontender. BS +, no masses or bruits. No hepatomegaly, no splenomegaly EXT:  2 + pulses throughout, no edema, no cyanosis no clubbing SKIN:  Warm and dry.  No rashes NEURO:  Alert and oriented x 3. Cranial nerves II through XII intact. PSYCH:  Cognitively intact    Labs:   Lab Results  Component Value Date   WBC 10.0 01/15/2016   HGB 14.7 01/15/2016   HCT 44.9 01/15/2016   MCV 87.5 01/15/2016   PLT 139 (L) 01/15/2016   No results for input(s): NA, K, CL, CO2, BUN, CREATININE, CALCIUM, PROT, BILITOT, ALKPHOS, ALT, AST, GLUCOSE in the last 168 hours.  Invalid input(s): LABALBU Lab Results  Component Value Date   CKMB 1.7 08/06/2008   No results found for: CHOL No results found for: HDL No results found for: LDLCALC No results found for: TRIG No results found for: CHOLHDL No results found for: LDLDIRECT  No results found for: PROBNP No results found for: TSH No results found for: HGBA1C  Radiology: No results found.  EKG: NSR with ST-T wave abnormality c/w inferior and lateral ischemia.  Echo: 6/127/17: Study Conclusions  - Left ventricle: The cavity size was normal. There was severe   concentric hypertrophy. Systolic function was vigorous. The   estimated ejection fraction was in the range of 65% to 70%. There   was no dynamic obstruction. Wall motion was normal; there were no   regional wall  motion abnormalities. Doppler parameters are   consistent with abnormal left ventricular relaxation (grade 1   diastolic dysfunction). Doppler parameters are consistent with   high ventricular filling pressure. - Mitral valve: Calcified annulus. - Tricuspid valve: There was mild regurgitation.  Impressions:  - No change when compared to prior.  Myoview: 01/15/16:Study Highlights     Nuclear stress EF: 43%. There is mid inferior wall hypokinesis.  There was no ST segment deviation noted during stress.  Defect 1: There is a small defect of mild severity present in the apical lateral and apex location. This defect is reversible and a small degree of ischemia cannot be excluded.  Overall, intermediate risk nuclear stress test secondary to small size of apical lateral defect and reduced ejection fraction.   Candee Furbish, MD      ASSESSMENT AND PLAN:  1. Angina pectoris class 3 with intermediate risk Myoview study. On 1 antianginal drug. Plan to proceed with cardiac cath with possible PCI. The procedure and risks were reviewed including but not limited to death, myocardial infarction, stroke, arrythmias, bleeding, transfusion, emergency surgery, dye allergy, or renal dysfunction. The patient voices understanding and is agreeable to proceed.. 2. Tobacco abuse. Counseled on smoking cessation. 3. DM type 2 4. HTN 5. Hyperlipidemia. If he has CAD will need to consider high dose statin.  Signed: Alexsis Kathman Martinique, [place]  01/22/2016, 9:57 AM

## 2016-01-22 NOTE — Interval H&P Note (Signed)
History and Physical Interval Note:  01/22/2016 10:07 AM  Pricilla Holm  has presented today for surgery, with the diagnosis of abnormal myoview - cp  The various methods of treatment have been discussed with the patient and family. After consideration of risks, benefits and other options for treatment, the patient has consented to  Procedure(s): Left Heart Cath and Coronary Angiography (N/A) as a surgical intervention .  The patient's history has been reviewed, patient examined, no change in status, stable for surgery.  I have reviewed the patient's chart and labs.  Questions were answered to the patient's satisfaction.   Cath Lab Visit (complete for each Cath Lab visit)  Clinical Evaluation Leading to the Procedure:   ACS: No.  Non-ACS:    Anginal Classification: CCS III  Anti-ischemic medical therapy: Minimal Therapy (1 class of medications)  Non-Invasive Test Results: Intermediate-risk stress test findings: cardiac mortality 1-3%/year  Prior CABG: No previous CABG        [facility] 01/22/2016 10:08 AM

## 2016-01-26 ENCOUNTER — Encounter: Payer: Self-pay | Admitting: *Deleted

## 2016-02-03 ENCOUNTER — Encounter: Payer: Self-pay | Admitting: Physician Assistant

## 2016-02-16 NOTE — Progress Notes (Signed)
Cardiology Office Note:    Date:  02/17/2016   ID:  Pricilla Holm, DOB Aug 09, 1960, MRN [id number]  PCP:  [name], MD  Cardiologist:  Dr. [name]   Electrophysiologist:  n/a  Referring MD: [name], MD   Chief Complaint  Patient presents with  . Follow-up    s/p cardiac cath    History of Present Illness:    RICKY DIGNAM is a 55 y.o. male with a hx of diabetes, hyperlipidemia, hypertension.  He had a cardiac cath in 2002 that demonstrated normal LVF and luminal irregularities.  He was recently evaluated by Dr. [name] in 6/17 for chest pain and dyspnea.  Echo demonstrated normal LVEF and mild diastolic dysfunction.  Myoview was intermediate risk with possible apical ischemia and EF 43%.  LHC was arranged and demonstrated no significant CAD.  LVEDP was elevated at 28.  There was apical dyskinesis noted.  He was placed on Lasix and FU arranged today.  He returns for FU.  Here with his wife.  He is fairly sedentary.  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  He is not sure if his breathing is any better. He has occ brief chest pains.  He denies exertional CP.  He denies orthopnea, PND.  His LE edema is stable.  He denies syncope.  He continues to smoke. He has a chronic cough with brownish sputum.  Denies hemoptysis.  His PCP recently adjusted his diabetic meds due to a Hgb A1c of 8.9.    Prior CV studies that were reviewed today include:    LHC 01/22/16  There is hyperdynamic overall left ventricular systolic function with apical dyskinesis.  LV end diastolic pressure is severely elevated.  The left ventricular ejection fraction is greater than 65% by visual estimate.  No significant CAD 1. No significant CAD 2. Apical dyskinesis with otherwise hyperdynamic LV contractility. Etiology of apical wall motion abnormality is unclear. 3. Elevated LV EDP of 28 mm Hg.  Plan: will initiate diuretic therapy for elevated EDP. DC  home today with close cardiology follow up.   Myoview 01/15/16  Nuclear stress EF: 43%. There is mid inferior wall hypokinesis.  There was no ST segment deviation noted during stress.  Defect 1: There is a small defect of mild severity present in the apical lateral and apex location. This defect is reversible and a small degree of ischemia cannot be excluded.  Overall, intermediate risk nuclear stress test secondary to small size of apical lateral defect and reduced ejection fraction.  Echo 12/16/15 Severe concentric LVH, vigorous LVEF, EF 65-70%, no dynamic obstruction, no RWMA, Gr 1 DD, MAC, mild TR   Past Medical History:  Diagnosis Date  . Acute renal failure ([place])    in setting of NSAID use and orthopedic surgery 2010  . Anxiety and depression   . DM2 (diabetes mellitus, type 2) ([place])   . Family history of early CAD   . HLD (hyperlipidemia)   . HTN (hypertension)   . Injuries     crushing injury to both his feet in February 2010.   Marland Kitchen Kidney calculi   . Tobacco abuse     Past Surgical History:  Procedure Laterality Date  . CARDIAC CATHETERIZATION N/A 01/22/2016   Procedure: Left Heart Cath and Coronary Angiography;  Surgeon: Peter M Martinique, MD;  Location: [facility];  Service: Cardiovascular;  Laterality: N/A;  . FOOT AMPUTATION  bilateral  . LITHOTRIPSY    . TONSILLECTOMY      Current Medications: Outpatient Medications Prior to Visit  Medication Sig Dispense Refill  . amitriptyline (ELAVIL) 150 MG tablet Take 150 mg by mouth at bedtime.      Marland Kitchen amLODipine (NORVASC) 5 MG tablet Take 5 mg by mouth daily.      Marland Kitchen aspirin EC 81 MG tablet Take 1 tablet (81 mg total) by mouth daily. 90 tablet 3  . dapagliflozin propanediol (FARXIGA) 10 MG TABS tablet Take 10 mg by mouth daily.    . diclofenac sodium (VOLTAREN) 1 % GEL Apply 1 application topically 4 (four) times daily. AS NEEDED FOR PAIN    . furosemide (LASIX) 20 MG tablet Take 1 tablet (20 mg total) by mouth  daily. 90 tablet 3  . glipiZIDE (GLUCOTROL XL) 5 MG 24 hr tablet Take 5 mg by mouth daily.      . hydrocortisone 2.5 % cream Apply 1 application topically 2 (two) times daily.    Marland Kitchen levETIRAcetam (KEPPRA) 250 MG tablet Take 1,000 mg by mouth every 12 (twelve) hours.      Marland Kitchen LORazepam (ATIVAN) 1 MG tablet Take 1 mg by mouth every 8 (eight) hours as needed. anxiety    . losartan (COZAAR) 100 MG tablet Take 100 mg by mouth daily.    Marland Kitchen omeprazole (PRILOSEC) 40 MG capsule Take 40 mg by mouth daily.    Marland Kitchen oxyCODONE-acetaminophen (PERCOCET) 10-325 MG per tablet Take 1 tablet by mouth every 6 (six) hours as needed. pain    . potassium chloride SA (K-DUR,KLOR-CON) 20 MEQ tablet Take 1 tablet (20 mEq total) by mouth daily. 90 tablet 3  . simvastatin (ZOCOR) 20 MG tablet Take 20 mg by mouth at bedtime.      Marland Kitchen tiZANidine (ZANAFLEX) 4 MG capsule Take 8-12 mg by mouth at bedtime.     . traZODone (DESYREL) 50 MG tablet Take 50 mg by mouth at bedtime.      No facility-administered medications prior to visit.       Allergies:   Hydromorphone hcl er; Nucynta [tapentadol hydrochloride]; Exalamide; and Tapentadol   Social History   Social History  . Marital status: Married    Spouse name: N/A  . Number of children: N/A  . Years of education: N/A   Occupational History  . disable    Social History Main Topics  . Smoking status: Current Every Day Smoker    Packs/day: 0.50    Types: Cigarettes  . Smokeless tobacco: Never Used  . Alcohol use Yes  . Drug use: No  . Sexual activity: Not Asked   Other Topics Concern  . None   Social History Narrative  . None     Family History:  The patient's family history includes Heart attack (age of onset: 19) in his brother; Heart attack (age of onset: 42) in his brother; Hypertension in his brother and sister; Leukemia (age of onset: 28) in his mother; Lung cancer (age of onset: 98) in his father.   ROS:   Please see the history of present illness.    ROS  All other systems reviewed and are negative.   EKGs/Labs/Other Test Reviewed:    EKG:  EKG is  ordered today.  The ekg ordered today demonstrates NSR, HR 93, normal axis, inf-lat TWI, QTc 440 ms, no change from prior tracing.   Recent Labs: 01/15/2016: Brain Natriuretic Peptide 30.9; BUN 17; Creat 0.85; Hemoglobin 14.7; Platelets 139; Potassium 4.0;  Sodium 138   Recent Lipid Panel No results found for: CHOL, TRIG, HDL, CHOLHDL, VLDL, LDLCALC, LDLDIRECT   Physical Exam:    VS:  BP (!) 102/58   Pulse 94   Ht 6' (1.829 m)   Wt 250 lb 12.8 oz (113.8 kg)   BMI 34.01 kg/m     Wt Readings from Last 3 Encounters:  02/17/16 250 lb 12.8 oz (113.8 kg)  01/22/16 250 lb (113.4 kg)  11/28/15 254 lb (115.2 kg)     Physical Exam  Constitutional: He is oriented to person, place, and time. He appears well-developed and well-nourished. No distress.  HENT:  Head: Normocephalic and atraumatic.  Eyes: No scleral icterus.  Neck: No JVD present.  Cardiovascular: Normal rate and regular rhythm.   Murmur heard.  Medium-pitched mid to late systolic murmur is present with a grade of 2/6  at the lower left sternal border Pulmonary/Chest: He has decreased breath sounds. He has no wheezes. He has no rhonchi. He has no rales.  Abdominal: Soft. There is no tenderness.  Musculoskeletal:  right wrist without hematoma or mass;  Trace tight bilateral LE edema.  Neurological: He is alert and oriented to person, place, and time.  Skin: Skin is warm and dry.  Psychiatric: He has a normal mood and affect.    ASSESSMENT:    1. Chronic diastolic CHF (congestive heart failure) ([place])   2. Essential hypertension   3. HLD (hyperlipidemia)   4. Tobacco abuse    PLAN:    In order of problems listed above:  1. Chronic diastolic CHF - He had a recent echocardiogram that demonstrated vigorous LVEF, severe concentric LVH and mild diastolic dysfunction. He had apical dyskinesis on left ventriculogram at cardiac  catheterization. There was no significant CAD. LVEDP was elevated and he was placed on Lasix. He did have recent BMET with his primary care physician last week. He is fairly sedentary. He continues to smoke. He has not really noticed much of a difference in his breathing. I suspect that with his vigorous LV function and elevated heart rate, he might have some improvement with lowering his heart rate to increase diastolic filling.  -  Continue current dose of Lasix  -  We discussed the importance of limiting dietary sodium and weighing himself daily  -  Decrease Norvasc to 5 mg daily  -  Start Toprol-XL 50 mg daily  -  Recent BMET from PCP 02/13/16: K+ 4.5, Creatinine 0.80  -  Follow-up one month  2. HTN - Blood pressure fairly well controlled. I do not think he would be able to tolerate Toprol in addition to Norvasc 5 mg twice a day. Therefore, Norvasc will be decreased to once daily.  3. Hyperlipidemia - Managed by primary care. Continue statin.  4. Tobacco abuse - We discussed the importance of cessation.    Medication Adjustments/Labs and Tests Ordered: Current medicines are reviewed at length with the patient today.  Concerns regarding medicines are outlined above.  Medication changes, Labs and Tests ordered today are outlined in the Patient Instructions noted below. Patient Instructions  Medication Instructions:  1. DECREASE NORVASC TO 5 MG ONCE A DAY 2. START METOPROLOL SUCCINATE 50 MG ONCE A DAY Labwork: I WILL CALL PCP TO GET RECENT LAB WORK Testing/Procedures: NONE Follow-Up: Richardson Dopp, [facility] 03/22/16 @ 10:15 Any Other Special Instructions Will Be Listed Below (If Applicable). WEIGH DAILY AND CALL THE OFFICE [phone] IF YOUR WEIGHT IS INCREASED 3 LB'S OR MORE IN 1  DAY If you need a refill on your cardiac medications before your next appointment, please call your pharmacy.    Signed, Richardson Dopp, PA-C  02/17/2016 4:58 PM    [facility] [place], [place], [place]  [address] Phone: [phone]; Fax: [phone]

## 2016-02-17 ENCOUNTER — Ambulatory Visit (INDEPENDENT_AMBULATORY_CARE_PROVIDER_SITE_OTHER): Payer: Medicare Other | Admitting: Physician Assistant

## 2016-02-17 ENCOUNTER — Encounter (INDEPENDENT_AMBULATORY_CARE_PROVIDER_SITE_OTHER): Payer: Self-pay

## 2016-02-17 ENCOUNTER — Encounter: Payer: Self-pay | Admitting: Physician Assistant

## 2016-02-17 VITALS — BP 102/58 | HR 94 | Ht 72.0 in | Wt 250.8 lb

## 2016-02-17 DIAGNOSIS — Z720 Tobacco use: Secondary | ICD-10-CM

## 2016-02-17 DIAGNOSIS — I5032 Chronic diastolic (congestive) heart failure: Secondary | ICD-10-CM

## 2016-02-17 DIAGNOSIS — I10 Essential (primary) hypertension: Secondary | ICD-10-CM | POA: Diagnosis not present

## 2016-02-17 DIAGNOSIS — E785 Hyperlipidemia, unspecified: Secondary | ICD-10-CM | POA: Diagnosis not present

## 2016-02-17 MED ORDER — METOPROLOL SUCCINATE ER 50 MG PO TB24: 50.0000 mg | ORAL_TABLET | Freq: Every day | ORAL | 3 refills | Status: DC

## 2016-02-17 NOTE — Patient Instructions (Addendum)
Medication Instructions:  1. DECREASE NORVASC TO 5 MG ONCE A DAY 2. START METOPROLOL SUCCINATE 50 MG ONCE A DAY Labwork: I WILL CALL PCP TO GET RECENT LAB WORK Testing/Procedures: NONE Follow-Up: Troy [name], [facility] 03/22/16 @ 10:15 Any Other Special Instructions Will Be Listed Below (If Applicable). WEIGH DAILY AND CALL THE OFFICE [phone] IF YOUR WEIGHT IS INCREASED 3 LB'S OR MORE IN 1 DAY   Low-Sodium Eating Plan -Sodium raises blood pressure and causes water to be held in the body. Getting less sodium from food will help lower your blood pressure, reduce any swelling, and protect your heart, liver, and kidneys. We get sodium by adding salt (sodium chloride) to food. Most of our sodium comes from canned, boxed, and frozen foods. Restaurant foods, fast foods, and pizza are also very high in sodium. Even if you take medicine to lower your blood pressure or to reduce fluid in your body, getting less sodium from your food is important. WHAT IS MY PLAN? Most people should limit their sodium intake to 2,300 mg a day. Your health care provider recommends that you limit your sodium intake to __________ a day.  WHAT DO I NEED TO KNOW ABOUT THIS EATING PLAN? For the low-sodium eating plan, you will follow these general guidelines:  Choose foods with a % Daily Value for sodium of less than 5% (as listed on the food label).   Use salt-free seasonings or herbs instead of table salt or sea salt.   Check with your health care provider or pharmacist before using salt substitutes.   Eat fresh foods.  Eat more vegetables and fruits.  Limit canned vegetables. If you do use them, rinse them well to decrease the sodium.   Limit cheese to 1 oz (28 g) per day.   Eat lower-sodium products, often labeled as "lower sodium" or "no salt added."  Avoid foods that contain monosodium glutamate (MSG). MSG is sometimes added to [place] food and some canned foods.  Check food labels (Nutrition Facts  labels) on foods to learn how much sodium is in one serving.  Eat more home-cooked food and less restaurant, buffet, and fast food.  When eating at a restaurant, ask that your food be prepared with less salt, or no salt if possible.  HOW DO I READ FOOD LABELS FOR SODIUM INFORMATION? The Nutrition Facts label lists the amount of sodium in one serving of the food. If you eat more than one serving, you must multiply the listed amount of sodium by the number of servings. Food labels may also identify foods as:  Sodium free--Less than 5 mg in a serving.  Very low sodium--35 mg or less in a serving.  Low sodium--140 mg or less in a serving.  Light in sodium--50% less sodium in a serving. For example, if a food that usually has 300 mg of sodium is changed to become light in sodium, it will have 150 mg of sodium.  Reduced sodium--25% less sodium in a serving. For example, if a food that usually has 400 mg of sodium is changed to reduced sodium, it will have 300 mg of sodium. WHAT FOODS CAN I EAT? Grains Low-sodium cereals, including oats, puffed wheat and rice, and shredded wheat cereals. Low-sodium crackers. Unsalted rice and pasta. Lower-sodium bread.  Vegetables Frozen or fresh vegetables. Low-sodium or reduced-sodium canned vegetables. Low-sodium or reduced-sodium tomato sauce and paste. Low-sodium or reduced-sodium tomato and vegetable juices.  Fruits Fresh, frozen, and canned fruit. Fruit juice.  Meat and Other  Protein Products Low-sodium canned tuna and salmon. Fresh or frozen meat, poultry, seafood, and fish. Lamb. Unsalted nuts. Dried beans, peas, and lentils without added salt. Unsalted canned beans. Homemade soups without salt. Eggs.  Dairy Milk. Soy milk. Ricotta cheese. Low-sodium or reduced-sodium cheeses. Yogurt.  Condiments Fresh and dried herbs and spices. Salt-free seasonings. Onion and garlic powders. Low-sodium varieties of mustard and ketchup. Fresh or refrigerated  horseradish. Lemon juice.  Fats and Oils Reduced-sodium salad dressings. Unsalted butter.  Other Unsalted popcorn and pretzels.  The items listed above may not be a complete list of recommended foods or beverages. Contact your dietitian for more options. WHAT FOODS ARE NOT RECOMMENDED? Grains Instant hot cereals. Bread stuffing, pancake, and biscuit mixes. Croutons. Seasoned rice or pasta mixes. Noodle soup cups. Boxed or frozen macaroni and cheese. Self-rising flour. Regular salted crackers. Vegetables Regular canned vegetables. Regular canned tomato sauce and paste. Regular tomato and vegetable juices. Frozen vegetables in sauces. Salted [place] fries. Olives. Angie Fava. Relishes. Sauerkraut. Salsa. Meat and Other Protein Products Salted, canned, smoked, spiced, or pickled meats, seafood, or fish. Bacon, ham, sausage, hot dogs, corned beef, chipped beef, and packaged luncheon meats. Salt pork. Jerky. Pickled herring. Anchovies, regular canned tuna, and sardines. Salted nuts. Dairy Processed cheese and cheese spreads. Cheese curds. Blue cheese and cottage cheese. Buttermilk.  Condiments Onion and garlic salt, seasoned salt, table salt, and sea salt. Canned and packaged gravies. Worcestershire sauce. Tartar sauce. Barbecue sauce. Teriyaki sauce. Soy sauce, including reduced sodium. Steak sauce. Fish sauce. Oyster sauce. Cocktail sauce. Horseradish that you find on the shelf. Regular ketchup and mustard. Meat flavorings and tenderizers. Bouillon cubes. Hot sauce. Tabasco sauce. Marinades. Taco seasonings. Relishes. Fats and Oils Regular salad dressings. Salted butter. Margarine. Ghee. Bacon fat.  Other Potato and tortilla chips. Corn chips and puffs. Salted popcorn and pretzels. Canned or dried soups. Pizza. Frozen entrees and pot pies.  The items listed above may not be a complete list of foods and beverages to avoid. Contact your dietitian for more information.   This information is  not intended to replace advice given to you by your health care provider. Make sure you discuss any questions you have with your health care provider.   Document Released: 11/27/2001 Document Revised: 06/28/2014 Document Reviewed: 04/11/2013 Elsevier Interactive Patient Education [employer]. If you need a refill on your cardiac medications before your next appointment, please call your pharmacy.

## 2016-03-22 ENCOUNTER — Ambulatory Visit (INDEPENDENT_AMBULATORY_CARE_PROVIDER_SITE_OTHER): Payer: Medicare Other | Admitting: Physician Assistant

## 2016-03-22 ENCOUNTER — Encounter: Payer: Self-pay | Admitting: Physician Assistant

## 2016-03-22 ENCOUNTER — Encounter (INDEPENDENT_AMBULATORY_CARE_PROVIDER_SITE_OTHER): Payer: Self-pay

## 2016-03-22 VITALS — Ht 72.0 in | Wt 250.8 lb

## 2016-03-22 DIAGNOSIS — R0600 Dyspnea, unspecified: Secondary | ICD-10-CM

## 2016-03-22 DIAGNOSIS — Z720 Tobacco use: Secondary | ICD-10-CM | POA: Diagnosis not present

## 2016-03-22 DIAGNOSIS — R0683 Snoring: Secondary | ICD-10-CM

## 2016-03-22 DIAGNOSIS — F17210 Nicotine dependence, cigarettes, uncomplicated: Secondary | ICD-10-CM

## 2016-03-22 DIAGNOSIS — R0602 Shortness of breath: Secondary | ICD-10-CM | POA: Diagnosis not present

## 2016-03-22 DIAGNOSIS — I5032 Chronic diastolic (congestive) heart failure: Secondary | ICD-10-CM

## 2016-03-22 DIAGNOSIS — E7800 Pure hypercholesterolemia, unspecified: Secondary | ICD-10-CM

## 2016-03-22 DIAGNOSIS — I10 Essential (primary) hypertension: Secondary | ICD-10-CM

## 2016-03-22 MED ORDER — LOSARTAN POTASSIUM 50 MG PO TABS: 50.0000 mg | ORAL_TABLET | Freq: Every day | ORAL | 3 refills | Status: DC

## 2016-03-22 NOTE — Progress Notes (Signed)
Cardiology Office Note:    Date:  03/22/2016   ID:  Pricilla Holm, DOB [birth year], MRN [id number]  PCP:  [name], MD  Cardiologist:  Dr. [name]  Electrophysiologist:  n/a  Referring MD: [name], MD   Chief Complaint  Patient presents with  . Follow-up    CHF    History of Present Illness:    RONIE BARNHART is a 55 y.o. 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  He had a cardiac cath in 2002 that demonstrated normal LVF and luminal irregularities.  He was evaluated by Dr. [name] in 6/17 for chest pain and dyspnea.  Echo demonstrated normal LVEF and mild diastolic dysfunction.  Myoview was intermediate risk with possible apical ischemia and EF 43%.  LHC demonstrated no significant CAD.  There was apical DK noted.      Last seen by me 02/17/16.  He remained short of breath and I placed him on Toprol to help with diastolic filling. He returns for FU.  Here with his wife.  He notes occ chest pain relieved by antacids. He remains sedentary.  He continues to smoke.  He notes continued dyspnea on exertion.  There has been no change.  He has mild pedal edema.  He denies orthopnea, PND.  His wife notes that he snores and she has witness apnea during sleep.    Prior CV studies that were reviewed today include:    LHC 01/22/16 1. No significant CAD 2. Apical dyskinesis with otherwise hyperdynamic LV contractility. Etiology of apical wall motion abnormality is unclear. 3. Elevated LV EDP of 28 mm Hg.  Plan: will initiate diuretic therapy for elevated EDP. DC home today with close cardiology follow up.   Myoview 01/15/16 Overall, intermediate risk nuclear stress test secondary to small size of apical lateral defect and reduced ejection fraction.  EF 43%  Echo 12/16/15 Severe concentric LVH, vigorous LVEF, EF 65-70%, no dynamic obstruction, no RWMA, Gr 1 DD, MAC, mild TR  Past Medical  History:  Diagnosis Date  . Acute renal failure ([place])    in setting of NSAID use and orthopedic surgery 2010  . Anxiety and depression   . Chronic diastolic CHF (congestive heart failure) ([place])    a. Echo 6/17: severe conc LVH, vigorous EF, EF 65-70%, no dynamic obstruction, no RWMA, Gr 1 DD, mild TR  //  b. LHC 8/17: no sig CAD, LVEDP 28  . DM2 (diabetes mellitus, type 2) ([place])   . Family history of early CAD   . 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 History of cardiac catheterization    a. [place] 2002: irregs  //  b. LHC in 8/17: no sig CAD, apical DK, hyperdynamic LV, LVEDP 28  . History of nuclear stress test    a. Nuc 7/17: Overall, intermediate risk nuclear stress test secondary to small size of apical lateral defect and reduced ejection fraction.  EF 43%  . HLD (hyperlipidemia)   . HTN (hypertension)   . Injuries     crushing injury to both his feet in February 2010.   Marland Kitchen Kidney calculi   . Tobacco abuse     Past Surgical History:  Procedure Laterality Date  . CARDIAC CATHETERIZATION N/A 01/22/2016   Procedure: Left Heart Cath and Coronary Angiography;  Surgeon: Peter M Martinique, MD;  Location: [facility];  Service: Cardiovascular;  Laterality: N/A;  . FOOT AMPUTATION     bilateral  . LITHOTRIPSY    . TONSILLECTOMY      Current Medications: Current Meds  Medication Sig  . amitriptyline (ELAVIL) 150 MG tablet Take 150 mg by mouth at bedtime.    Marland Kitchen amLODipine (NORVASC) 5 MG tablet Take 5 mg by mouth daily.    Marland Kitchen aspirin EC 81 MG tablet Take 1 tablet (81 mg total) by mouth daily.  . dapagliflozin propanediol (FARXIGA) 10 MG TABS tablet Take 10 mg by mouth daily.  . diclofenac sodium (VOLTAREN) 1 % GEL Apply 1 application topically 4 (four) times daily. AS NEEDED FOR PAIN  . furosemide (LASIX) 20 MG tablet Take 1 tablet (20 mg total) by mouth daily.  Marland Kitchen glipiZIDE (GLUCOTROL) 10 MG tablet Take 10 mg by mouth 2 (two) times daily before a meal.  .  hydrocortisone 2.5 % cream Apply 1 application topically 2 (two) times daily.  Marland Kitchen levETIRAcetam (KEPPRA) 250 MG tablet TAKE 1 1/2 TABLET BY MOUTH TWICE DAILY  . LORazepam (ATIVAN) 1 MG tablet Take 1 mg by mouth 2 (two) times daily. anxiety  . losartan (COZAAR) 50 MG tablet Take 1 tablet (50 mg total) by mouth daily.  . metFORMIN (GLUCOPHAGE-XR) 500 MG 24 hr tablet Take 500 mg by mouth 2 (two) times daily.  . metoprolol succinate (TOPROL-XL) 50 MG 24 hr tablet Take 1 tablet (50 mg total) by mouth daily. Take with or immediately following a meal.  . omeprazole (PRILOSEC) 40 MG capsule Take 40 mg by mouth daily.  Marland Kitchen oxyCODONE-acetaminophen (PERCOCET) 10-325 MG per tablet Take 1 tablet by mouth every 6 (six) hours as needed. pain  . potassium chloride SA (K-DUR,KLOR-CON) 20 MEQ tablet Take 1 tablet (20 mEq total) by mouth daily.  . simvastatin (ZOCOR) 40 MG tablet Take 40 mg by mouth daily.  . sitaGLIPtin (JANUVIA) 100 MG tablet Take 100 mg by mouth daily.   Marland Kitchen tiZANidine (ZANAFLEX) 4 MG capsule Take 8-12 mg by mouth at bedtime.   . traZODone (DESYREL) 50 MG tablet Take 50 mg by mouth at bedtime.   . [DISCONTINUED] losartan (COZAAR) 100 MG tablet Take 100 mg by mouth daily.     Allergies:   Hydromorphone hcl er; Nucynta [tapentadol hydrochloride]; Exalamide; and Tapentadol   Social History   Social History  . Marital status: Married    Spouse name: N/A  . Number of children: N/A  . Years of education: N/A   Occupational History  . disable    Social History Main Topics  . Smoking status: Current Every Day Smoker    Packs/day: 0.50    Types: Cigarettes  . Smokeless tobacco: Never Used  . Alcohol use Yes  . Drug use: No  . Sexual activity: Not Asked   Other Topics Concern  . None   Social History Narrative  . None     Family History:  The patient's family history includes Heart attack (age of onset: 32) in his brother; Heart attack (age of onset: 68) in his brother; Hypertension in  his brother and sister; Leukemia (age of onset: 67) in his mother; Lung cancer (age of onset: 82) in his father.   ROS:   Please see the history of present illness.    Review of Systems  Constitution: Positive for malaise/fatigue.  HENT: Positive for headaches.   Cardiovascular: Positive for chest pain, dyspnea on exertion and irregular heartbeat.  Respiratory: Positive for cough and shortness of  breath.   Musculoskeletal: Positive for back pain.  Neurological: Positive for loss of balance.  Psychiatric/Behavioral: The patient is nervous/anxious.    All other systems reviewed and are negative.   EKGs/Labs/Other Test Reviewed:    EKG:  EKG is  ordered today.  The ekg ordered today demonstrates NSR, HR 71, normal axis, TWI in 1, 2, aVF, V3-6, QTc 465 ms, no significant change when compared to prior tracing (?LVH with repol)  Recent Labs: 01/15/2016: Brain Natriuretic Peptide 30.9; BUN 17; Creat 0.85; Hemoglobin 14.7; Platelets 139; Potassium 4.0; Sodium 138   Recent Lipid Panel No results found for: CHOL, TRIG, HDL, CHOLHDL, VLDL, LDLCALC, LDLDIRECT   Physical Exam:    VS:  Ht 6' (1.829 m)   Wt 250 lb 12.8 oz (113.8 kg)   BMI 34.01 kg/m     Wt Readings from Last 3 Encounters:  03/22/16 250 lb 12.8 oz (113.8 kg)  02/17/16 250 lb 12.8 oz (113.8 kg)  01/22/16 250 lb (113.4 kg)     Physical Exam  Constitutional: He is oriented to person, place, and time. He appears well-developed and well-nourished. No distress.  HENT:  Head: Normocephalic and atraumatic.  Eyes: No scleral icterus.  Neck: No JVD present.  Cardiovascular: Normal rate and regular rhythm.   Murmur heard.  Low-pitched early systolic murmur is present with a grade of 1/6  at the upper right sternal border Pulmonary/Chest: He has decreased breath sounds. He has no wheezes. He has no rales.  Abdominal: Soft. There is no tenderness.  Musculoskeletal: He exhibits no edema.  Neurological: He is alert and oriented to  person, place, and time.  Skin: Skin is warm and dry.  Psychiatric: He has a normal mood and affect.    ASSESSMENT:    1. Chronic diastolic CHF (congestive heart failure) ([place])   2. Essential hypertension   3. Pure hypercholesterolemia   4. Tobacco abuse   5. Shortness of breath   6. Snoring   7. Dyspnea, unspecified type    PLAN:    In order of problems listed above:  1. Chronic diastolic CHF - Echo in 6/55 with vigorous LVEF and severe LVH.  LVEDP was high at his LHC in 8/17.  At last visit, I added beta-blocker Rx (Metoprolol Succinate 50 mg QD) to increase diastolic filling.  He remains short of breath.  His dyspnea is likely multifactorial.  See below.  Volume appears stable.  Continue current Rx.  We discussed the importance of daily weights, monitoring salt intake and weight loss.    2. HTN - BP somewhat low.  Will decrease Losartan to 50 mg QD.   3. HL - Managed by primary care. Continue statin.  4. Tobacco abuse - He continues to smoke.  I counseled him for 5 minutes on the importance of cessation and different techniques for quitting.   5. Dyspnea - I suspect his shortness of breath is multifactorial and related to diastolic CHF, obesity, smoking, deconditioning.  He likely has COPD.  I will refer him to Pulmonology.  I did explain to him that, above all else, he has to quit smoking.  6. Snoring - His wife has witnessed apneic episodes.  Will set up split night sleep study.    Medication Adjustments/Labs and Tests Ordered: Current medicines are reviewed at length with the patient today.  Concerns regarding medicines are outlined above.  Medication changes, Labs and Tests ordered today are outlined in the Patient Instructions noted below. Patient Instructions  Medication  Instructions:  1. DECREASE LOSARTAN TO 50 MG DAILY; NEW RX HAS BEEN SENT IN FOR THE 50 MG TABLET  Labwork: NONE  Testing/Procedures: Your physician has recommended that you have a SPLIT NIGHT sleep  study. This test records several body functions during sleep, including: brain activity, eye movement, oxygen and carbon dioxide blood levels, heart rate and rhythm, breathing rate and rhythm, the flow of air through your mouth and nose, snoring, body muscle movements, and chest and belly movement. BETHANY DILLARD, CMA WILL CALL YOU TO SCHEDULE YOUR TEST  Follow-Up: DR. [name] IN 3 MONTHS  YOU ARE BEING REFERRED TO [facility] DYSPNEA  Any Other Special Instructions Will Be Listed Below (If Applicable).  If you need a refill on your cardiac medications before your next appointment, please call your pharmacy.  Signed, Richardson Dopp, PA-C  03/22/2016 11:08 AM    [facility] [place], [place], [place]  [address] Phone: [phone]; Fax: [phone]

## 2016-03-22 NOTE — Patient Instructions (Addendum)
Medication Instructions:  1. DECREASE LOSARTAN TO 50 MG DAILY; NEW RX HAS BEEN SENT IN FOR THE 50 MG TABLET  Labwork: NONE  Testing/Procedures: Your physician has recommended that you have a SPLIT NIGHT sleep study. This test records several body functions during sleep, including: brain activity, eye movement, oxygen and carbon dioxide blood levels, heart rate and rhythm, breathing rate and rhythm, the flow of air through your mouth and nose, snoring, body muscle movements, and chest and belly movement. BETHANY DILLARD, CMA WILL CALL YOU TO SCHEDULE YOUR TEST  Follow-Up: DR. [name] IN 3 MONTHS  YOU ARE BEING REFERRED TO [facility] DYSPNEA  Any Other Special Instructions Will Be Listed Below (If Applicable).  If you need a refill on your cardiac medications before your next appointment, please call your pharmacy.

## 2016-05-10 ENCOUNTER — Encounter (HOSPITAL_BASED_OUTPATIENT_CLINIC_OR_DEPARTMENT_OTHER): Payer: Medicare Other

## 2016-05-12 ENCOUNTER — Ambulatory Visit (INDEPENDENT_AMBULATORY_CARE_PROVIDER_SITE_OTHER): Payer: Medicare Other | Admitting: Emergency Medicine

## 2016-05-12 ENCOUNTER — Encounter: Payer: Self-pay | Admitting: Emergency Medicine

## 2016-05-12 ENCOUNTER — Ambulatory Visit (INDEPENDENT_AMBULATORY_CARE_PROVIDER_SITE_OTHER)
Admission: RE | Admit: 2016-05-12 | Discharge: 2016-05-12 | Disposition: A | Discharge status: Home / Self Care | Payer: Medicare Other | Source: Ambulatory Visit | Attending: Emergency Medicine | Admitting: Emergency Medicine

## 2016-05-12 DIAGNOSIS — R0600 Dyspnea, unspecified: Secondary | ICD-10-CM

## 2016-05-12 DIAGNOSIS — Z720 Tobacco use: Secondary | ICD-10-CM | POA: Diagnosis not present

## 2016-05-12 NOTE — Assessment & Plan Note (Signed)
High likelihood that he has COPD, poorly characterized to date. Suspect his OSA and deconditioning also contributing, superimposed on his diastolic dysfxn.  - will perform full PFT - will reorder his PSG; discussed with him today OSA, its ramifications and its treatment.  - discussed our ultimate goal of smoking cessation.  - CXR today.  - will check a-1 AT if we confirm obstruction on his PFT

## 2016-05-12 NOTE — Patient Instructions (Addendum)
We will perform full pulmonary function testing.  We will reschedule your sleep study.  We will need to work on decreasing your smoking. We will help you do this.  Follow with Dr Lamonte Sakai next available after your testing is done to review the results.

## 2016-05-12 NOTE — Progress Notes (Signed)
Subjective:    Patient ID: Troy [name], male    DOB: 1960-08-05, 55 y.o.   MRN: [id number]  HPI 55 year old current smoker (30+ pack years), hx of hypertension, diastolic dysfunction with diastolic CHF, DM. He has been under eval by Cardiology for exertional SOB, underwent TTE 6/'17 that showed diastolic dysfxn, an intermediate risk Myoview stress test 01/22/16. Prompted L heart cath 01/22/16 > no significant CAD, apical dyskinesis with otherwise hyperdynamic left ventricle, elevated LVEDP. Has been treated w metoprolol, losartan, statin, He has continued to have dyspnea - . Also has noted to Kathleen Argue at [facility] that he snores, has had witnessed apneas. A split night sleep study was planned but he didn't go for it. He is sleepy during the day. He does nap. Has had witnessed apneas. He has cough some days, prod mucous. Has been dx w bronchitis before. Has been on BD's before when he had a flare.    Review of Systems  Constitutional: Negative for fever and unexpected weight change.  HENT: Positive for sneezing. Negative for congestion, dental problem, ear pain, nosebleeds, postnasal drip, rhinorrhea, sinus pressure, sore throat and trouble swallowing.   Eyes: Negative for redness and itching.  Respiratory: Positive for cough and shortness of breath. Negative for chest tightness and wheezing.   Cardiovascular: Positive for chest pain and palpitations. Negative for leg swelling.  Gastrointestinal: Negative for nausea and vomiting.  Genitourinary: Negative for dysuria.  Musculoskeletal: Negative for joint swelling.  Skin: Negative for rash.  Neurological: Negative for headaches.  Hematological: Does not bruise/bleed easily.  Psychiatric/Behavioral: Negative for dysphoric mood. The patient is nervous/anxious.    Past Medical History:  Diagnosis Date  . Acute renal failure ([place])    in setting of NSAID use and orthopedic surgery 2010  . Anxiety and depression   . Chronic diastolic CHF (congestive  heart failure) ([place])    a. Echo 6/17: severe conc LVH, vigorous EF, EF 65-70%, no dynamic obstruction, no RWMA, Gr 1 DD, mild TR  //  b. LHC 8/17: no sig CAD, LVEDP 28  . DM2 (diabetes mellitus, type 2) ([place])   . Family history of early CAD   . History of amputation of foot ([place])    L trans-met // R toe  . History of cardiac catheterization    a. [place] 2002: irregs  //  b. LHC in 8/17: no sig CAD, apical DK, hyperdynamic LV, LVEDP 28  . History of nuclear stress test    a. Nuc 7/17: Overall, intermediate risk nuclear stress test secondary to small size of apical lateral defect and reduced ejection fraction.  EF 43%  . HLD (hyperlipidemia)   . HTN (hypertension)   . Injuries     crushing injury to both his feet in February 2010.   Marland Kitchen Kidney calculi   . Tobacco abuse      Family History  Problem Relation Age of Onset  . Leukemia Mother 3    died  . Lung cancer Father 42    died  . Heart attack Brother 55  . Heart attack Brother 23  . Hypertension Brother     X3  . Hypertension Sister     X2     Social History   Social History  . Marital status: Married    Spouse name: N/A  . Number of children: N/A  . Years of education: N/A   Occupational History  . disable    Social History Main Topics  . Smoking status:  Current Every Day Smoker    Packs/day: 0.50    Years: 40.00    Types: Cigarettes  . Smokeless tobacco: Never Used  . Alcohol use Yes  . Drug use: No  . Sexual activity: Not on file   Other Topics Concern  . Not on file   Social History Narrative  . No narrative on file     Allergies  Allergen Reactions  . Hydromorphone Hcl Er Anaphylaxis  . Nucynta [Tapentadol Hydrochloride] Other (See Comments)    Throat swells  . Exalamide Other (See Comments)  . Tapentadol Other (See Comments)    Throat swells Angiodema     Outpatient Medications Prior to Visit  Medication Sig Dispense Refill  . amitriptyline (ELAVIL) 150 MG tablet Take 150 mg by mouth at  bedtime.      Marland Kitchen amLODipine (NORVASC) 5 MG tablet Take 5 mg by mouth daily.      Marland Kitchen aspirin EC 81 MG tablet Take 1 tablet (81 mg total) by mouth daily. 90 tablet 3  . dapagliflozin propanediol (FARXIGA) 10 MG TABS tablet Take 10 mg by mouth daily.    . diclofenac sodium (VOLTAREN) 1 % GEL Apply 1 application topically 4 (four) times daily. AS NEEDED FOR PAIN    . glipiZIDE (GLUCOTROL) 10 MG tablet Take 10 mg by mouth 2 (two) times daily before a meal.    . hydrocortisone 2.5 % cream Apply 1 application topically 2 (two) times daily.    Marland Kitchen levETIRAcetam (KEPPRA) 250 MG tablet TAKE 1 1/2 TABLET BY MOUTH TWICE DAILY    . LORazepam (ATIVAN) 1 MG tablet Take 1 mg by mouth 2 (two) times daily. anxiety    . losartan (COZAAR) 50 MG tablet Take 1 tablet (50 mg total) by mouth daily. 90 tablet 3  . metFORMIN (GLUCOPHAGE-XR) 500 MG 24 hr tablet Take 500 mg by mouth 2 (two) times daily.    . metoprolol succinate (TOPROL-XL) 50 MG 24 hr tablet Take 1 tablet (50 mg total) by mouth daily. Take with or immediately following a meal. 90 tablet 3  . omeprazole (PRILOSEC) 40 MG capsule Take 40 mg by mouth daily.    Marland Kitchen oxyCODONE-acetaminophen (PERCOCET) 10-325 MG per tablet Take 1 tablet by mouth every 6 (six) hours as needed. pain    . simvastatin (ZOCOR) 40 MG tablet Take 40 mg by mouth daily.    . sitaGLIPtin (JANUVIA) 100 MG tablet Take 100 mg by mouth daily.     Marland Kitchen tiZANidine (ZANAFLEX) 4 MG capsule Take 8-12 mg by mouth at bedtime.     . traZODone (DESYREL) 50 MG tablet Take 50 mg by mouth at bedtime.     . furosemide (LASIX) 20 MG tablet Take 1 tablet (20 mg total) by mouth daily. 90 tablet 3  . potassium chloride SA (K-DUR,KLOR-CON) 20 MEQ tablet Take 1 tablet (20 mEq total) by mouth daily. 90 tablet 3   No facility-administered medications prior to visit.         Objective:   Physical Exam Vitals:   05/12/16 0915 05/12/16 0916  BP:  114/62  Pulse:  (!) 107  SpO2:  97%  Weight: 250 lb (113.4 kg)     Height: 6' (1.829 m)    Gen: Pleasant, overwt man, in no distress,  normal affect  ENT: No lesions,  mouth clear,  Crowded post pharynx, oropharynx clear, no postnasal drip  Neck: No JVD, no TMG, no carotid bruits  Lungs: No use of accessory muscles, clear  without rales or rhonchi  Cardiovascular: RRR, heart sounds normal, no murmur or gallops, no peripheral edema  Musculoskeletal: No deformities, no cyanosis or clubbing  Neuro: alert, non focal  Skin: Warm, no lesions or rashes      Assessment & Plan:  Tobacco abuse Discussed cessation with him today.   Shortness of breath High likelihood that he has COPD, poorly characterized to date. Suspect his OSA and deconditioning also contributing, superimposed on his diastolic dysfxn.  - will perform full PFT - will reorder his PSG; discussed with him today OSA, its ramifications and its treatment.  - discussed our ultimate goal of smoking cessation.  - CXR today.  - will check a-1 AT if we confirm obstruction on his PFT  Baltazar Apo, MD, PhD 05/12/2016, 9:54 AM [facility] [phone] or if no answer [phone]

## 2016-05-12 NOTE — Assessment & Plan Note (Signed)
Discussed cessation with him today 

## 2016-05-17 ENCOUNTER — Telehealth: Payer: Self-pay | Admitting: Emergency Medicine

## 2016-05-17 NOTE — Telephone Encounter (Signed)
Called and spoke to pt's wife. Informed her of the results per RB. Pt's wife verbalized understanding and denied any further questions or concerns at this time.   Notes Recorded by Collene Gobble, MD on 05/12/2016 at 1:08 PM EST Please inform pt that his CXR is stable. It shows metallic fragments on the L, no worrisome findings. Thanks

## 2016-05-17 NOTE — Progress Notes (Signed)
Called and spoke to pt's wife. Informed her of the results per RB. Pt's wife verbalized understanding and denied any further questions or concerns at this time.

## 2016-05-18 ENCOUNTER — Ambulatory Visit: Payer: Medicare Other | Attending: Physician Assistant | Admitting: Neurology

## 2016-05-18 DIAGNOSIS — R0683 Snoring: Secondary | ICD-10-CM | POA: Insufficient documentation

## 2016-06-05 NOTE — Procedures (Signed)
[place] A. Merlene Laughter, MD     [email or web address]        NOCTURNAL POLYSOMNOGRAM    LOCATION: SLEEP LAB FACILITY: [facility]   PHYSICIAN: Jarryd Gratz A. Merlene Laughter, M.D.   DATE OF STUDY: 05/18/16   REFERRING PHYSICIAN: Kathlen Mody, PA/ [place].   INDICATIONS: Snoring and fatifue  MEDICATIONS:  Prior to Admission medications   Medication Sig Start Date End Date Taking? Authorizing Provider  amitriptyline (ELAVIL) 150 MG tablet Take 150 mg by mouth at bedtime.      Historical Provider, MD  amLODipine (NORVASC) 5 MG tablet Take 5 mg by mouth daily.      Historical Provider, MD  aspirin EC 81 MG tablet Take 1 tablet (81 mg total) by mouth daily. 11/28/15   Fay Records, MD  dapagliflozin propanediol (FARXIGA) 10 MG TABS tablet Take 10 mg by mouth daily.    Historical Provider, MD  diclofenac sodium (VOLTAREN) 1 % GEL Apply 1 application topically 4 (four) times daily. AS NEEDED FOR PAIN    Historical Provider, MD  furosemide (LASIX) 20 MG tablet Take 1 tablet (20 mg total) by mouth daily. 01/22/16 04/21/16  Peter M Martinique, MD  glipiZIDE (GLUCOTROL) 10 MG tablet Take 10 mg by mouth 2 (two) times daily before a meal.    Historical Provider, MD  hydrocortisone 2.5 % cream Apply 1 application topically 2 (two) times daily.    Historical Provider, MD  levETIRAcetam (KEPPRA) 250 MG tablet TAKE 1 1/2 TABLET BY MOUTH TWICE DAILY    Historical Provider, MD  LORazepam (ATIVAN) 1 MG tablet Take 1 mg by mouth 2 (two) times daily. anxiety    Historical Provider, MD  losartan (COZAAR) 50 MG tablet Take 1 tablet (50 mg total) by mouth daily. 03/22/16   Liliane Shi, PA-C  metFORMIN (GLUCOPHAGE-XR) 500 MG 24 hr tablet Take 500 mg by mouth 2 (two) times daily. 03/08/16   Historical Provider, MD  metoprolol succinate (TOPROL-XL) 50 MG 24 hr tablet Take 1 tablet (50 mg total) by mouth daily. Take with or immediately following a meal. 02/17/16 05/17/16  Liliane Shi, PA-C  omeprazole (PRILOSEC) 40 MG  capsule Take 40 mg by mouth daily.    Historical Provider, MD  oxyCODONE-acetaminophen (PERCOCET) 10-325 MG per tablet Take 1 tablet by mouth every 6 (six) hours as needed. pain    Historical Provider, MD  potassium chloride SA (K-DUR,KLOR-CON) 20 MEQ tablet Take 1 tablet (20 mEq total) by mouth daily. 01/22/16 04/21/16  Peter M Martinique, MD  simvastatin (ZOCOR) 40 MG tablet Take 40 mg by mouth daily.    Historical Provider, MD  sitaGLIPtin (JANUVIA) 100 MG tablet Take 100 mg by mouth daily.  02/03/16 02/02/17  Historical Provider, MD  tiZANidine (ZANAFLEX) 4 MG capsule Take 8-12 mg by mouth at bedtime.     Historical Provider, MD  traZODone (DESYREL) 50 MG tablet Take 50 mg by mouth at bedtime.     Historical Provider, MD       ARCHITECTURAL SUMMARY: Total recording time was 366 minutes. Sleep efficiency 72 %. Sleep latency 71 minutes. REM latency N/A minutes. Stage NI 8 %, N2 89 % and N3 3 % and REM sleep N/A %.    RESPIRATORY DATA:  Baseline oxygen saturation is 96 %. The lowest saturation is 85 %. The diagnostic AHI is 2.3. The RDI is 2.3. The REM AHI is N/A.  LIMB MOVEMENT SUMMARY: PLM index 0.   ELECTROCARDIOGRAM SUMMARY: No significant dysrhythmias observed.  IMPRESSION:  1. This study reveals absent REM but is otherwise unremarkable.   Thanks for this referral.  Ayelen Sciortino A. Merlene Laughter, M.D. Diplomat, [employer] adviser of [employer].

## 2016-06-06 NOTE — Procedures (Signed)
   Patient Name: Troy, [name] Date: 05/18/2016 Gender: Male D.O.B: [birth year] Age (years): 55 Referring Provider: Richardson Dopp Height (inches): 14 Interpreting Physician: Fransico Him MD, ABSM Weight (lbs): 250 RPSGT: Rosebud Poles BMI: 34 MRN: [id number] Neck Size: 21.50  CLINICAL INFORMATION Sleep Study Type: NPSG  Indication for sleep study: Snoring  Epworth Sleepiness Score: 23  SLEEP STUDY TECHNIQUE As per the AASM Manual for the Scoring of Sleep and Associated Events v2.3 (April 2016) with a hypopnea requiring 4% desaturations.  The channels recorded and monitored were frontal, central and occipital EEG, electrooculogram (EOG), submentalis EMG (chin), nasal and oral airflow, thoracic and abdominal wall motion, anterior tibialis EMG, snore microphone, electrocardiogram, and pulse oximetry.  MEDICATIONS Medications self-administered by patient taken the night of the study : N/A  SLEEP ARCHITECTURE The study was initiated at 10:56:19 PM and ended at 5:02:52 AM.  Sleep onset time was 70.8 minutes and the sleep efficiency was 72.1%. The total sleep time was 264.3 minutes.  Stage REM latency was N/A minutes.  The patient spent 7.95% of the night in stage N1 sleep, 88.93% in stage N2 sleep, 3.12% in stage N3 and 0.00% in REM.  Alpha intrusion was absent.  Supine sleep was 100.00%.  RESPIRATORY PARAMETERS The overall apnea/hypopnea index (AHI) was 2.3 per hour. There were 1 total apneas, including 0 obstructive, 1 central and 0 mixed apneas. There were 9 hypopneas and 0 RERAs.  The AHI during Stage REM sleep was N/A per hour.  AHI while supine was 2.3 per hour.  The mean oxygen saturation was 93.38%. The minimum SpO2 during sleep was 85.00%.  Moderate snoring was noted during this study.  CARDIAC DATA The 2 lead EKG demonstrated sinus rhythm. The mean heart rate was N/A beats per minute. Other EKG findings include: Occasional PVCs  LEG MOVEMENT  DATA The total PLMS were 0 with a resulting PLMS index of 0.00. Associated arousal with leg movement index was 0.0 .  IMPRESSIONS - No significant obstructive sleep apnea occurred during this study (AHI = 2.3/h). - No significant central sleep apnea occurred during this study (CAI = 0.2/h). - Mild oxygen desaturation was noted during this study (Min O2 = 85.00%). - The patient snored with Moderate snoring volume. - Occasional PVCs were noted during this study. - Clinically significant periodic limb movements did not occur during sleep. No significant associated arousals.  DIAGNOSIS - Normal study - PVCs  RECOMMENDATIONS - Avoid alcohol, sedatives and other CNS depressants that may worsen sleep apnea and disrupt normal sleep architecture. - Sleep hygiene should be reviewed to assess factors that may improve sleep quality. - Weight management and regular exercise should be initiated or continued if appropriate.  [place], American Board of Sleep Medicine  ELECTRONICALLY SIGNED ON:  06/06/2016, 7:19 PM [place] PH: [phone]   FX: [phone] [place]

## 2016-06-07 ENCOUNTER — Encounter: Payer: Self-pay | Admitting: Emergency Medicine

## 2016-06-07 ENCOUNTER — Other Ambulatory Visit: Payer: Medicare Other

## 2016-06-07 ENCOUNTER — Telehealth: Payer: Self-pay | Admitting: *Deleted

## 2016-06-07 ENCOUNTER — Ambulatory Visit (HOSPITAL_COMMUNITY)
Admission: RE | Admit: 2016-06-07 | Discharge: 2016-06-07 | Disposition: A | Discharge status: Home / Self Care | Payer: Medicare Other | Source: Ambulatory Visit | Attending: Emergency Medicine | Admitting: Emergency Medicine

## 2016-06-07 ENCOUNTER — Ambulatory Visit (INDEPENDENT_AMBULATORY_CARE_PROVIDER_SITE_OTHER): Payer: Medicare Other | Admitting: Emergency Medicine

## 2016-06-07 VITALS — BP 118/68 | HR 115 | Ht 72.0 in | Wt 252.6 lb

## 2016-06-07 DIAGNOSIS — J449 Chronic obstructive pulmonary disease, unspecified: Secondary | ICD-10-CM

## 2016-06-07 DIAGNOSIS — Z7984 Long term (current) use of oral hypoglycemic drugs: Secondary | ICD-10-CM | POA: Diagnosis not present

## 2016-06-07 DIAGNOSIS — Z720 Tobacco use: Secondary | ICD-10-CM

## 2016-06-07 DIAGNOSIS — R0683 Snoring: Secondary | ICD-10-CM | POA: Diagnosis not present

## 2016-06-07 DIAGNOSIS — J438 Other emphysema: Secondary | ICD-10-CM

## 2016-06-07 DIAGNOSIS — F17210 Nicotine dependence, cigarettes, uncomplicated: Secondary | ICD-10-CM | POA: Diagnosis not present

## 2016-06-07 DIAGNOSIS — Z79899 Other long term (current) drug therapy: Secondary | ICD-10-CM | POA: Insufficient documentation

## 2016-06-07 DIAGNOSIS — I110 Hypertensive heart disease with heart failure: Secondary | ICD-10-CM | POA: Diagnosis not present

## 2016-06-07 DIAGNOSIS — I5032 Chronic diastolic (congestive) heart failure: Secondary | ICD-10-CM | POA: Insufficient documentation

## 2016-06-07 DIAGNOSIS — E119 Type 2 diabetes mellitus without complications: Secondary | ICD-10-CM | POA: Insufficient documentation

## 2016-06-07 DIAGNOSIS — J4489 Other specified chronic obstructive pulmonary disease: Secondary | ICD-10-CM

## 2016-06-07 LAB — PULMONARY FUNCTION TEST
DL/VA % PRED: 115 %
DL/VA: 5.48 ml/min/mmHg/L
DLCO UNC % PRED: 73 %
DLCO unc: 25.87 ml/min/mmHg
FEF 25-75 PRE: 3.26 L/s
FEF 25-75 Post: 2.56 L/sec
FEF2575-%CHANGE-POST: -21 %
FEF2575-%PRED-POST: 76 %
FEF2575-%PRED-PRE: 97 %
FEV1-%Change-Post: -4 %
FEV1-%Pred-Post: 63 %
FEV1-%Pred-Pre: 66 %
FEV1-Post: 2.52 L
FEV1-Pre: 2.65 L
FEV1FVC-%CHANGE-POST: -1 %
FEV1FVC-%Pred-Pre: 112 %
FEV6-%CHANGE-POST: -4 %
FEV6-%Pred-Post: 59 %
FEV6-%Pred-Pre: 62 %
FEV6-PRE: 3.11 L
FEV6-Post: 2.98 L
FEV6FVC-%Change-Post: 0 %
FEV6FVC-%PRED-PRE: 104 %
FEV6FVC-%Pred-Post: 104 %
FVC-%CHANGE-POST: -2 %
FVC-%PRED-POST: 57 %
FVC-%Pred-Pre: 59 %
FVC-Post: 3.02 L
FVC-Pre: 3.11 L
POST FEV1/FVC RATIO: 84 %
PRE FEV1/FVC RATIO: 85 %
Post FEV6/FVC ratio: 100 %
Pre FEV6/FVC Ratio: 100 %
RV % pred: 88 %
RV: 2.03 L
TLC % pred: 71 %
TLC: 5.26 L

## 2016-06-07 MED ORDER — ALBUTEROL SULFATE (2.5 MG/3ML) 0.083% IN NEBU
2.5000 mg | INHALATION_SOLUTION | Freq: Once | RESPIRATORY_TRACT | Status: AC
  Administered 2016-06-07: 2.5 mg via RESPIRATORY_TRACT

## 2016-06-07 MED ORDER — TIOTROPIUM BROMIDE MONOHYDRATE 2.5 MCG/ACT IN AERS: 2.0000 | INHALATION_SPRAY | Freq: Every day | RESPIRATORY_TRACT | 5 refills | Status: DC

## 2016-06-07 NOTE — Progress Notes (Signed)
Please tell patient that his sleep study shows that he does not have obstructive sleep apnea. Continue with current treatment plan. Richardson Dopp, PA-C   06/07/2016 12:09 PM

## 2016-06-07 NOTE — Telephone Encounter (Signed)
DPR ok to s/w pt's wife. Pt's wife has been notified of pt's sleep study results negative for OSA. Wife states they had not had heard yet about making appt to see Dr. [name] per [place] saw [place]. [place] 03/2016 and asked for a 3 month f/u with Dr. [name], appt made today 07/16/16 8:45. Wife agreeable to plan of care for the pt.

## 2016-06-07 NOTE — Progress Notes (Signed)
Spoke to the patient gave him his results and he verbalized understanding

## 2016-06-07 NOTE — Progress Notes (Signed)
DPR ok to s/w pt's wife. Pt's wife has been notified of pt's sleep study results negative for OSA. Wife states they had not had heard yet about making appt to see Dr. [name] per [place] saw [place]. [place] 03/2016 and asked for a 3 month f/u with Dr. [name], appt made today 07/16/16 8:45. Wife agreeable to plan of care for the pt.

## 2016-06-07 NOTE — Progress Notes (Signed)
Subjective:    Patient ID: Troy [name], male    DOB: [birth year], 55 y.o.   MRN: [id number]  HPI 55 year old current smoker (30+ pack years), hx of hypertension, diastolic dysfunction with diastolic CHF, DM. He has been under eval by Cardiology for exertional SOB, underwent TTE 6/'17 that showed diastolic dysfxn, an intermediate risk Myoview stress test 01/22/16. Prompted L heart cath 01/22/16 > no significant CAD, apical dyskinesis with otherwise hyperdynamic left ventricle, elevated LVEDP. Has been treated w metoprolol, losartan, statin, He has continued to have dyspnea - . Also has noted to Kathleen Argue at [facility] that he snores, has had witnessed apneas. A split night sleep study was planned but he didn't go for it. He is sleepy during the day. He does nap. Has had witnessed apneas. He has cough some days, prod mucous. Has been dx w bronchitis before. Has been on BD's before when he had a flare.   ROV 06/07/16 -- This follow-up visit for patient with history of tobacco abuse, hypertension, diastolic dysfunction, diabetes. He is had a cardiology evaluation as outlined above. A polysomnogram was done on 05/18/16 that did not show any clinically significant obstructive sleep apnea.  He underwent primary function testing today that I personally reviewed. This shows spirometry consistent with mixed obstruction and restriction, restrictive changes on lung volumes but with an elevated residual volume, and mildly decreased diffusion capacity that corrects to the normal range when adjusted for alveolar volume. He tells me that he has been having some SOB w heavier exertion. Has some dry cough. He hasn't smoked in 2 days.    Review of Systems  Constitutional: Negative for fever and unexpected weight change.  HENT: Positive for sneezing. Negative for congestion, dental problem, ear pain, nosebleeds, postnasal drip, rhinorrhea, sinus pressure, sore throat and trouble swallowing.   Eyes: Negative for redness and  itching.  Respiratory: Positive for cough and shortness of breath. Negative for chest tightness and wheezing.   Cardiovascular: Positive for chest pain and palpitations. Negative for leg swelling.  Gastrointestinal: Negative for nausea and vomiting.  Genitourinary: Negative for dysuria.  Musculoskeletal: Negative for joint swelling.  Skin: Negative for rash.  Neurological: Negative for headaches.  Hematological: Does not bruise/bleed easily.  Psychiatric/Behavioral: Negative for dysphoric mood. The patient is nervous/anxious.    Past Medical History:  Diagnosis Date  . Acute renal failure ([place])    in setting of NSAID use and orthopedic surgery 2010  . Anxiety and depression   . Chronic diastolic CHF (congestive heart failure) ([place])    a. Echo 6/17: severe conc LVH, vigorous EF, EF 65-70%, no dynamic obstruction, no RWMA, Gr 1 DD, mild TR  //  b. LHC 8/17: no sig CAD, LVEDP 28  . DM2 (diabetes mellitus, type 2) ([place])   . Family history of early CAD   . History of amputation of foot ([place])    L trans-met // R toe  . History of cardiac catheterization    a. [place] 2002: irregs  //  b. LHC in 8/17: no sig CAD, apical DK, hyperdynamic LV, LVEDP 28  . History of nuclear stress test    a. Nuc 7/17: Overall, intermediate risk nuclear stress test secondary to small size of apical lateral defect and reduced ejection fraction.  EF 43%  . HLD (hyperlipidemia)   . HTN (hypertension)   . Injuries     crushing injury to both his feet in February 2010.   Marland Kitchen Kidney calculi   . Tobacco  abuse      Family History  Problem Relation Age of Onset  . Leukemia Mother 43    died  . Lung cancer Father 84    died  . Heart attack Brother 12  . Heart attack Brother 62  . Hypertension Brother     X3  . Hypertension Sister     X2     Social History   Social History  . Marital status: Married    Spouse name: N/A  . Number of children: N/A  . Years of education: N/A   Occupational History  .  disable    Social History Main Topics  . Smoking status: Current Every Day Smoker    Packs/day: 0.50    Years: 40.00    Types: Cigarettes  . Smokeless tobacco: Never Used  . Alcohol use Yes  . Drug use: No  . Sexual activity: Not on file   Other Topics Concern  . Not on file   Social History Narrative  . No narrative on file     Allergies  Allergen Reactions  . Hydromorphone Hcl Er Anaphylaxis  . Nucynta [Tapentadol Hydrochloride] Other (See Comments)    Throat swells  . Exalamide Other (See Comments)  . Tapentadol Other (See Comments)    Throat swells Angiodema     Outpatient Medications Prior to Visit  Medication Sig Dispense Refill  . amitriptyline (ELAVIL) 150 MG tablet Take 150 mg by mouth at bedtime.      Marland Kitchen amLODipine (NORVASC) 5 MG tablet Take 5 mg by mouth daily.      Marland Kitchen aspirin EC 81 MG tablet Take 1 tablet (81 mg total) by mouth daily. 90 tablet 3  . dapagliflozin propanediol (FARXIGA) 10 MG TABS tablet Take 10 mg by mouth daily.    . diclofenac sodium (VOLTAREN) 1 % GEL Apply 1 application topically 4 (four) times daily. AS NEEDED FOR PAIN    . glipiZIDE (GLUCOTROL) 10 MG tablet Take 10 mg by mouth 2 (two) times daily before a meal.    . hydrocortisone 2.5 % cream Apply 1 application topically 2 (two) times daily.    Marland Kitchen levETIRAcetam (KEPPRA) 250 MG tablet TAKE 1 1/2 TABLET BY MOUTH TWICE DAILY    . LORazepam (ATIVAN) 1 MG tablet Take 1 mg by mouth 2 (two) times daily. anxiety    . losartan (COZAAR) 50 MG tablet Take 1 tablet (50 mg total) by mouth daily. 90 tablet 3  . metFORMIN (GLUCOPHAGE-XR) 500 MG 24 hr tablet Take 500 mg by mouth 2 (two) times daily.    Marland Kitchen omeprazole (PRILOSEC) 40 MG capsule Take 40 mg by mouth daily.    Marland Kitchen oxyCODONE-acetaminophen (PERCOCET) 10-325 MG per tablet Take 1 tablet by mouth every 6 (six) hours as needed. pain    . simvastatin (ZOCOR) 40 MG tablet Take 40 mg by mouth daily.    . sitaGLIPtin (JANUVIA) 100 MG tablet Take 100 mg by  mouth daily.     Marland Kitchen tiZANidine (ZANAFLEX) 4 MG capsule Take 8-12 mg by mouth at bedtime.     . traZODone (DESYREL) 50 MG tablet Take 50 mg by mouth at bedtime.     . furosemide (LASIX) 20 MG tablet Take 1 tablet (20 mg total) by mouth daily. 90 tablet 3  . metoprolol succinate (TOPROL-XL) 50 MG 24 hr tablet Take 1 tablet (50 mg total) by mouth daily. Take with or immediately following a meal. 90 tablet 3  . potassium chloride SA (K-DUR,KLOR-CON) 20  MEQ tablet Take 1 tablet (20 mEq total) by mouth daily. 90 tablet 3   No facility-administered medications prior to visit.         Objective:   Physical Exam Vitals:   06/07/16 1609 06/07/16 1611  BP:  118/68  Pulse:  (!) 115  SpO2:  97%  Weight: 252 lb 9.6 oz (114.6 kg)   Height: 6' (1.829 m)    Gen: Pleasant, overwt man, in no distress,  normal affect  ENT: No lesions,  mouth clear,  Crowded post pharynx, oropharynx clear, no postnasal drip  Neck: No JVD, no TMG, no carotid bruits  Lungs: No use of accessory muscles, clear without rales or rhonchi  Cardiovascular: RRR, heart sounds normal, no murmur or gallops, no peripheral edema  Musculoskeletal: No deformities, no cyanosis or clubbing  Neuro: alert, non focal  Skin: Warm, no lesions or rashes      Assessment & Plan:  COPD (chronic obstructive pulmonary disease) (HCC) Her obstruction noted on his pulmonary function testing in conjunction with superimposed restriction. I'd like to try him empirically on Spiriva to see if he benefits. Also check a-1 AT genotype.   Tobacco abuse Discussed cessation w him today. Discussed techniques to assist with this - he has been off cigs x 2 days.   Snoring His PSG was reassuring, no indication to start CPAP.   Baltazar Apo, MD, PhD 06/07/2016, 5:10 PM [facility] [phone] or if no answer [phone]

## 2016-06-07 NOTE — Assessment & Plan Note (Signed)
Discussed cessation w him today. Discussed techniques to assist with this - he has been off cigs x 2 days.

## 2016-06-07 NOTE — Patient Instructions (Addendum)
Please continue to work hard on stopping smoking  We will start Spiriva 2 inhalations daily until next visit.  We will check blood work today.  Follow with Dr Lamonte Sakai in 1 month

## 2016-06-07 NOTE — Assessment & Plan Note (Signed)
His PSG was reassuring, no indication to start CPAP.

## 2016-06-07 NOTE — Assessment & Plan Note (Signed)
Her obstruction noted on his pulmonary function testing in conjunction with superimposed restriction. I'd like to try him empirically on Spiriva to see if he benefits. Also check a-1 AT genotype.

## 2016-06-12 LAB — ALPHA-1 ANTITRYPSIN PHENOTYPE: A1 ANTITRYPSIN: 154 mg/dL (ref 83–199)

## 2016-06-16 ENCOUNTER — Telehealth: Payer: Self-pay | Admitting: Emergency Medicine

## 2016-06-16 NOTE — Telephone Encounter (Signed)
Called and spoke with the pharmacy and they stated that the spiriva respimat is not covered by the insurance---they will cover either the anoro or the incruse ellipta.  RB please advise. Thanks  Allergies  Allergen Reactions  . Hydromorphone Hcl Er Anaphylaxis  . Nucynta [Tapentadol Hydrochloride] Other (See Comments)    Throat swells  . Exalamide Other (See Comments)  . Tapentadol Other (See Comments)    Throat swells Angiodema

## 2016-06-23 NOTE — Telephone Encounter (Signed)
lmtcb x1 for pt. We need to make him aware of the medication change.

## 2016-06-23 NOTE — Telephone Encounter (Signed)
Would try the incruse, see if he benefits from this

## 2016-06-24 NOTE — Telephone Encounter (Signed)
lmtcb x2 for pt. 

## 2016-06-25 NOTE — Telephone Encounter (Signed)
lmtcb x3 for pt. 

## 2016-06-28 MED ORDER — UMECLIDINIUM BROMIDE 62.5 MCG/INH IN AEPB: 1.0000 | INHALATION_SPRAY | Freq: Every day | RESPIRATORY_TRACT | 0 refills | Status: DC

## 2016-06-28 NOTE — Telephone Encounter (Signed)
Called and spoke to pt's wife. Informed her of the recs per RB to relay to pt. Sample of incruse have been left up front for pick up and aware to call back for rx if they like inhaler. Pt's wife verbalized understanding and denied any further questions or concerns at this time.

## 2016-07-02 ENCOUNTER — Ambulatory Visit (INDEPENDENT_AMBULATORY_CARE_PROVIDER_SITE_OTHER): Payer: Medicare Other | Admitting: Internal Medicine

## 2016-07-02 ENCOUNTER — Encounter (INDEPENDENT_AMBULATORY_CARE_PROVIDER_SITE_OTHER): Payer: Self-pay

## 2016-07-02 ENCOUNTER — Encounter: Payer: Self-pay | Admitting: Internal Medicine

## 2016-07-02 VITALS — BP 128/66 | HR 100 | Ht 72.0 in | Wt 254.8 lb

## 2016-07-02 DIAGNOSIS — I5189 Other ill-defined heart diseases: Secondary | ICD-10-CM

## 2016-07-02 DIAGNOSIS — I519 Heart disease, unspecified: Secondary | ICD-10-CM

## 2016-07-02 DIAGNOSIS — R0602 Shortness of breath: Secondary | ICD-10-CM

## 2016-07-02 DIAGNOSIS — I10 Essential (primary) hypertension: Secondary | ICD-10-CM | POA: Diagnosis not present

## 2016-07-02 NOTE — Patient Instructions (Signed)
Your physician recommends that you continue on your current medications as directed. Please refer to the Current Medication list given to you today.  Your physician recommends that you return for lab work in: today (BMET, BNP)  Your physician wants you to follow-up in: May, 2018 with Dr. [name].  You will receive a reminder letter in the mail two months in advance. If you don't receive a letter, please call our office to schedule the follow-up appointment.

## 2016-07-02 NOTE — Progress Notes (Signed)
Cardiology Office Note   Date:  07/02/2016   ID:  Troy [name], DOB Jan 01, 1961, MRN [id number]  PCP:  [name], MD  Cardiologist:   [name], MD   F?U of diastolic CHF       History of Present Illness: Troy [name] is a 56 y.o. male with a history of diastolic CHF, DM, HL HTN.  Cath in 2002 normal LVEF  Luminal irreg .  I saw him in June  Echo showed normal LVEF and mild diastolic dysfuncton  [place] with possible apical ischemia  LHC with no signif CAD   There was a apical DK noted  LVEDP 28   He has been seen by Kathleen Argue in October   Remained SOB  Placed on toprol    Denies CP     Current Meds  Medication Sig  . amitriptyline (ELAVIL) 150 MG tablet Take 150 mg by mouth at bedtime.    Marland Kitchen amLODipine (NORVASC) 5 MG tablet Take 5 mg by mouth daily.    Marland Kitchen aspirin EC 81 MG tablet Take 1 tablet (81 mg total) by mouth daily.  . dapagliflozin propanediol (FARXIGA) 10 MG TABS tablet Take 10 mg by mouth daily.  . diclofenac sodium (VOLTAREN) 1 % GEL Apply 1 application topically 4 (four) times daily. AS NEEDED FOR PAIN  . furosemide (LASIX) 20 MG tablet Take 1 tablet (20 mg total) by mouth daily.  Marland Kitchen glipiZIDE (GLUCOTROL) 10 MG tablet Take 10 mg by mouth 2 (two) times daily before a meal.  . hydrocortisone 2.5 % cream Apply 1 application topically 2 (two) times daily.  Marland Kitchen levETIRAcetam (KEPPRA) 250 MG tablet TAKE 1 1/2 TABLET BY MOUTH TWICE DAILY  . LORazepam (ATIVAN) 1 MG tablet Take 1 mg by mouth 2 (two) times daily. anxiety  . losartan (COZAAR) 50 MG tablet Take 1 tablet (50 mg total) by mouth daily.  . metFORMIN (GLUCOPHAGE-XR) 500 MG 24 hr tablet Take 500 mg by mouth 2 (two) times daily.  . metoprolol succinate (TOPROL-XL) 50 MG 24 hr tablet Take 1 tablet (50 mg total) by mouth daily. Take with or immediately following a meal.  . omeprazole (PRILOSEC) 40 MG capsule Take 40 mg by mouth daily.  Marland Kitchen oxyCODONE-acetaminophen (PERCOCET) 10-325 MG per tablet Take 1 tablet by  mouth every 6 (six) hours as needed. pain  . potassium chloride SA (K-DUR,KLOR-CON) 20 MEQ tablet Take 1 tablet (20 mEq total) by mouth daily.  . simvastatin (ZOCOR) 40 MG tablet Take 40 mg by mouth daily.  . sitaGLIPtin (JANUVIA) 100 MG tablet Take 100 mg by mouth daily.   . Tiotropium Bromide Monohydrate (SPIRIVA RESPIMAT) 2.5 MCG/ACT AERS Inhale 2 puffs into the lungs daily.  Marland Kitchen tiZANidine (ZANAFLEX) 4 MG capsule Take 8-12 mg by mouth at bedtime.   . traZODone (DESYREL) 50 MG tablet Take 50 mg by mouth at bedtime.   Marland Kitchen umeclidinium bromide (INCRUSE ELLIPTA) 62.5 MCG/INH AEPB Inhale 1 puff into the lungs daily.     Allergies:   Hydromorphone hcl er; Nucynta [tapentadol hydrochloride]; Exalamide; and Tapentadol   Past Medical History:  Diagnosis Date  . Acute renal failure ([place])    in setting of NSAID use and orthopedic surgery 2010  . Anxiety and depression   . Chronic diastolic CHF (congestive heart failure) ([place])    a. Echo 6/17: severe conc LVH, vigorous EF, EF 65-70%, no dynamic obstruction, no RWMA, Gr 1 DD, mild TR  //  b. LHC 8/17: no sig  CAD, LVEDP 28  . DM2 (diabetes mellitus, type 2) ([place])   . Family history of early CAD   . History of amputation of foot ([place])    L trans-met // R toe  . History of cardiac catheterization    a. [place] 2002: irregs  //  b. LHC in 8/17: no sig CAD, apical DK, hyperdynamic LV, LVEDP 28  . History of nuclear stress test    a. Nuc 7/17: Overall, intermediate risk nuclear stress test secondary to small size of apical lateral defect and reduced ejection fraction.  EF 43%  . HLD (hyperlipidemia)   . HTN (hypertension)   . Injuries     crushing injury to both his feet in February 2010.   Marland Kitchen Kidney calculi   . Tobacco abuse     Past Surgical History:  Procedure Laterality Date  . CARDIAC CATHETERIZATION N/A 01/22/2016   Procedure: Left Heart Cath and Coronary Angiography;  Surgeon: Peter M Martinique, MD;  Location: [facility];  Service:  Cardiovascular;  Laterality: N/A;  . FOOT AMPUTATION     bilateral  . LITHOTRIPSY    . TONSILLECTOMY       Social History:  The patient  reports that he has been smoking Cigarettes.  He has a 20.00 pack-year smoking history. He has never used smokeless tobacco. He reports that he drinks alcohol. He reports that he does not use drugs.   Family History:  The patient's family history includes Heart attack (age of onset: 79) in his brother; Heart attack (age of onset: 65) in his brother; Hypertension in his brother and sister; Leukemia (age of onset: 26) in his mother; Lung cancer (age of onset: 54) in his father.    ROS:  Please see the history of present illness. All other systems are reviewed and  Negative to the above problem except as noted.    PHYSICAL EXAM: VS:  BP 128/66   Pulse 100   Ht 6' (1.829 m)   Wt 254 lb 12.8 oz (115.6 kg)   SpO2 96%   BMI 34.56 kg/m   GEN: Well nourished, well developed, in no acute distress  HEENT: normal  Neck: no JVD, carotid bruits, or masses Cardiac: RRR; no murmurs, rubs, or gallops,no edema  Respiratory:  clear to auscultation bilaterally, normal work of breathing GI: soft, nontender, nondistended, + BS  No hepatomegaly  MS: no deformity Moving all extremities   Skin: warm and dry, no rash Neuro:  Strength and sensation are intact Psych: euthymic mood, full affect   EKG:  EKG is not  ordered today.   Lipid Panel No results found for: CHOL, TRIG, HDL, CHOLHDL, VLDL, LDLCALC, LDLDIRECT    Wt Readings from Last 3 Encounters:  07/02/16 254 lb 12.8 oz (115.6 kg)  06/07/16 252 lb 9.6 oz (114.6 kg)  05/12/16 250 lb (113.4 kg)      ASSESSMENT AND PLAN: 1  Diastolic CHF  Chronic  WIll set up for labs today  Change in Rx based on this   2  HTN  BP is well controlled    F/U later this Spring    Current medicines are reviewed at length with the patient today.  The patient does not have concerns regarding medicines.  Signed, [name], MD  07/02/2016 9:00 AM    [address], [place], [place]  [address] Phone: [phone]; Fax: [phone]

## 2016-07-03 LAB — SPECIMEN STATUS

## 2016-07-05 LAB — BASIC METABOLIC PANEL
BUN/Creatinine Ratio: 20 (ref 9–20)
BUN: 15 mg/dL (ref 6–24)
CALCIUM: 9.3 mg/dL (ref 8.7–10.2)
CO2: 20 mmol/L (ref 18–29)
CREATININE: 0.75 mg/dL — AB (ref 0.76–1.27)
Chloride: 94 mmol/L — ABNORMAL LOW (ref 96–106)
GFR calc Af Amer: 119 mL/min/{1.73_m2} (ref >59)
GFR, EST NON AFRICAN AMERICAN: 103 mL/min/{1.73_m2} (ref >59)
Glucose: 275 mg/dL — ABNORMAL HIGH (ref 65–99)
Potassium: 4.1 mmol/L (ref 3.5–5.2)
Sodium: 137 mmol/L (ref 134–144)

## 2016-07-05 LAB — PRO B NATRIURETIC PEPTIDE: NT-PRO BNP: 101 pg/mL (ref 0–210)

## 2016-07-13 ENCOUNTER — Ambulatory Visit (INDEPENDENT_AMBULATORY_CARE_PROVIDER_SITE_OTHER): Payer: Medicare Other | Admitting: Emergency Medicine

## 2016-07-13 ENCOUNTER — Encounter: Payer: Self-pay | Admitting: Emergency Medicine

## 2016-07-13 DIAGNOSIS — J449 Chronic obstructive pulmonary disease, unspecified: Secondary | ICD-10-CM | POA: Diagnosis not present

## 2016-07-13 DIAGNOSIS — F17210 Nicotine dependence, cigarettes, uncomplicated: Secondary | ICD-10-CM

## 2016-07-13 DIAGNOSIS — R0683 Snoring: Secondary | ICD-10-CM

## 2016-07-13 DIAGNOSIS — Z720 Tobacco use: Secondary | ICD-10-CM

## 2016-07-13 MED ORDER — UMECLIDINIUM BROMIDE 62.5 MCG/INH IN AEPB: 1.0000 | INHALATION_SPRAY | Freq: Every day | RESPIRATORY_TRACT | 0 refills | Status: DC

## 2016-07-13 NOTE — Assessment & Plan Note (Signed)
He has not yet started Incruse but he is willing to do so.

## 2016-07-13 NOTE — Progress Notes (Signed)
Subjective:    Patient ID: Troy [name], male    DOB: 12/02/1960, 56 y.o.   MRN: [id number]  HPI 56 year old current smoker (30+ pack years), hx of hypertension, diastolic dysfunction with diastolic CHF, DM. He has been under eval by Cardiology for exertional SOB, underwent TTE 6/'17 that showed diastolic dysfxn, an intermediate risk Myoview stress test 01/22/16. Prompted L heart cath 01/22/16 > no significant CAD, apical dyskinesis with otherwise hyperdynamic left ventricle, elevated LVEDP. Has been treated w metoprolol, losartan, statin, He has continued to have dyspnea - . Also has noted to Kathleen Argue at [facility] that he snores, has had witnessed apneas. A split night sleep study was planned but he didn't go for it. He is sleepy during the day. He does nap. Has had witnessed apneas. He has cough some days, prod mucous. Has been dx w bronchitis before. Has been on BD's before when he had a flare.   ROV 06/07/16 -- This follow-up visit for patient with history of tobacco abuse, hypertension, diastolic dysfunction, diabetes. He is had a cardiology evaluation as outlined above. A polysomnogram was done on 05/18/16 that did not show any clinically significant obstructive sleep apnea.  He underwent primary function testing today that I personally reviewed. This shows spirometry consistent with mixed obstruction and restriction, restrictive changes on lung volumes but with an elevated residual volume, and mildly decreased diffusion capacity that corrects to the normal range when adjusted for alveolar volume. He tells me that he has been having some SOB w heavier exertion. Has some dry cough. He hasn't smoked in 2 days.   ROV 07/13/16 -- This is a follow-up visit for patient with a history of obesity, tobacco use, hypertension with diastolic CHF, and obstructive lung disease identified on recent pulmonary function testing. 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 I tried starting him on  Spiriva, changed to Incruse for insurance coverage. He has not tried it yet, but did pick it up. He is smoking about 3 cigarettes a day.    Review of Systems  Constitutional: Negative for fever and unexpected weight change.  HENT: Positive for sneezing. Negative for congestion, dental problem, ear pain, nosebleeds, postnasal drip, rhinorrhea, sinus pressure, sore throat and trouble swallowing.   Eyes: Negative for redness and itching.  Respiratory: Positive for cough and shortness of breath. Negative for chest tightness and wheezing.   Cardiovascular: Positive for chest pain and palpitations. Negative for leg swelling.  Gastrointestinal: Negative for nausea and vomiting.  Genitourinary: Negative for dysuria.  Musculoskeletal: Negative for joint swelling.  Skin: Negative for rash.  Neurological: Negative for headaches.  Hematological: Does not bruise/bleed easily.  Psychiatric/Behavioral: Negative for dysphoric mood. The patient is nervous/anxious.    Past Medical History:  Diagnosis Date  . Acute renal failure ([place])    in setting of NSAID use and orthopedic surgery 2010  . Anxiety and depression   . Chronic diastolic CHF (congestive heart failure) ([place])    a. Echo 6/17: severe conc LVH, vigorous EF, EF 65-70%, no dynamic obstruction, no RWMA, Gr 1 DD, mild TR  //  b. LHC 8/17: no sig CAD, LVEDP 28  . DM2 (diabetes mellitus, type 2) ([place])   . Family history of early CAD   . History of amputation of foot ([place])    L trans-met // R toe  . History of cardiac catheterization    a. [place] 2002: irregs  //  b. LHC in 8/17: no  sig CAD, apical DK, hyperdynamic LV, LVEDP 28  . History of nuclear stress test    a. Nuc 7/17: Overall, intermediate risk nuclear stress test secondary to small size of apical lateral defect and reduced ejection fraction.  EF 43%  . HLD (hyperlipidemia)   . HTN (hypertension)   . Injuries     crushing injury to both his feet in February 2010.   Marland Kitchen Kidney calculi   .  Tobacco abuse      Family History  Problem Relation Age of Onset  . Leukemia Mother 71    died  . Lung cancer Father 32    died  . Heart attack Brother 66  . Heart attack Brother 79  . Hypertension Brother     X3  . Hypertension Sister     X2     Social History   Social History  . Marital status: Married    Spouse name: N/A  . Number of children: N/A  . Years of education: N/A   Occupational History  . disable    Social History Main Topics  . Smoking status: Current Every Day Smoker    Packs/day: 0.25    Years: 40.00    Types: Cigarettes  . Smokeless tobacco: Never Used  . Alcohol use Yes  . Drug use: No  . Sexual activity: Not on file   Other Topics Concern  . Not on file   Social History Narrative  . No narrative on file     Allergies  Allergen Reactions  . Hydromorphone Hcl Er Anaphylaxis  . Nucynta [Tapentadol Hydrochloride] Other (See Comments)    Throat swells  . Exalamide Other (See Comments)  . Tapentadol Other (See Comments)    Throat swells Angiodema     Outpatient Medications Prior to Visit  Medication Sig Dispense Refill  . amitriptyline (ELAVIL) 150 MG tablet Take 150 mg by mouth at bedtime.      Marland Kitchen amLODipine (NORVASC) 5 MG tablet Take 5 mg by mouth daily.      Marland Kitchen aspirin EC 81 MG tablet Take 1 tablet (81 mg total) by mouth daily. 90 tablet 3  . dapagliflozin propanediol (FARXIGA) 10 MG TABS tablet Take 10 mg by mouth daily.    . diclofenac sodium (VOLTAREN) 1 % GEL Apply 1 application topically 4 (four) times daily. AS NEEDED FOR PAIN    . glipiZIDE (GLUCOTROL) 10 MG tablet Take 10 mg by mouth 2 (two) times daily before a meal.    . hydrocortisone 2.5 % cream Apply 1 application topically 2 (two) times daily.    Marland Kitchen levETIRAcetam (KEPPRA) 250 MG tablet TAKE 1 1/2 TABLET BY MOUTH TWICE DAILY    . LORazepam (ATIVAN) 1 MG tablet Take 1 mg by mouth 2 (two) times daily. anxiety    . losartan (COZAAR) 50 MG tablet Take 1 tablet (50 mg total) by  mouth daily. 90 tablet 3  . metFORMIN (GLUCOPHAGE-XR) 500 MG 24 hr tablet Take 500 mg by mouth 2 (two) times daily.    Marland Kitchen omeprazole (PRILOSEC) 40 MG capsule Take 40 mg by mouth daily.    Marland Kitchen oxyCODONE-acetaminophen (PERCOCET) 10-325 MG per tablet Take 1 tablet by mouth every 6 (six) hours as needed. pain    . simvastatin (ZOCOR) 40 MG tablet Take 40 mg by mouth daily.    . sitaGLIPtin (JANUVIA) 100 MG tablet Take 100 mg by mouth daily.     . Tiotropium Bromide Monohydrate (SPIRIVA RESPIMAT) 2.5 MCG/ACT AERS Inhale 2  puffs into the lungs daily. 1 Inhaler 5  . tiZANidine (ZANAFLEX) 4 MG capsule Take 8-12 mg by mouth at bedtime.     . traZODone (DESYREL) 50 MG tablet Take 50 mg by mouth at bedtime.     Marland Kitchen umeclidinium bromide (INCRUSE ELLIPTA) 62.5 MCG/INH AEPB Inhale 1 puff into the lungs daily. 7 each 0  . furosemide (LASIX) 20 MG tablet Take 1 tablet (20 mg total) by mouth daily. (Patient taking differently: Take 40 mg by mouth daily. ) 90 tablet 3  . metoprolol succinate (TOPROL-XL) 50 MG 24 hr tablet Take 1 tablet (50 mg total) by mouth daily. Take with or immediately following a meal. 90 tablet 3  . potassium chloride SA (K-DUR,KLOR-CON) 20 MEQ tablet Take 1 tablet (20 mEq total) by mouth daily. 90 tablet 3   No facility-administered medications prior to visit.         Objective:   Physical Exam Vitals:   07/13/16 0942  BP: 102/70  Pulse: (!) 107  SpO2: 96%  Weight: 257 lb (116.6 kg)  Height: 6' (1.829 m)   Gen: Pleasant, overwt man, in no distress,  normal affect  ENT: No lesions,  mouth clear,  Crowded post pharynx, oropharynx clear, no postnasal drip  Neck: No JVD, no TMG, no carotid bruits  Lungs: No use of accessory muscles, clear without rales or rhonchi  Cardiovascular: RRR, heart sounds normal, no murmur or gallops, no peripheral edema  Musculoskeletal: No deformities, no cyanosis or clubbing  Neuro: alert, non focal  Skin: Warm, no lesions or rashes        Assessment & Plan:  Snoring No evidence of clinically significant obstructive sleep apnea yet noted on his polysomnogram.  COPD (chronic obstructive pulmonary disease) ([place]) He has not yet started Incruse but he is willing to do so.   Tobacco abuse He is smoking 3 cigarettes a day and does appear to be motivated to stop completely. We talked about this in detail including how to set a quit date, techniques for stopping, resources available to assist him  Baltazar Apo, MD, PhD 07/13/2016, 10:26 AM [facility] [phone] or if no answer [phone]

## 2016-07-13 NOTE — Progress Notes (Signed)
Patient seen in the office today and instructed on use of Incruse.  Patient expressed understanding and demonstrated technique. 

## 2016-07-13 NOTE — Assessment & Plan Note (Signed)
He is smoking 3 cigarettes a day and does appear to be motivated to stop completely. We talked about this in detail including how to set a quit date, techniques for stopping, resources available to assist him

## 2016-07-13 NOTE — Patient Instructions (Addendum)
We discussed today techniques to stop smoking.  Start the Incruse once a day for the next 3-4 weeks, then call us to let us know if you have benefited. If so we will order for you.  We will work on weight loss and exercise.  Follow with Dr Lamonte Sakai in 3 months or sooner if you have any problem

## 2016-07-13 NOTE — Addendum Note (Signed)
Addended by: Tyson Dense on: 07/13/2016 10:35 AM   Modules accepted: Orders

## 2016-07-13 NOTE — Assessment & Plan Note (Signed)
No evidence of clinically significant obstructive sleep apnea yet noted on his polysomnogram.

## 2016-07-16 ENCOUNTER — Ambulatory Visit: Payer: Medicare Other | Admitting: Internal Medicine

## 2016-08-26 NOTE — Progress Notes (Signed)
 Cardiology Office Note   Date:  08/27/2016   ID:  Troy [name], DOB 06/30/1960, MRN [id number]  PCP:  NYLAND,LEONARD ROBERT, MD  Cardiologist:    , MD   F/U of diastolic CHF      History of Present Illness: Troy [name] is a 56 y.o. male with a history ofdiastolic CHF, DM, HL HTN.  Cath in 2002 normal LVEF  Luminal irreg .  I saw him in June  Echo showed normal LVEF and mild diastolic dysfuncton  [place] with possible apical ischemia  LHC with no signif CAD   There was a apical DK noted  LVEDP 28   He was last seen in clinic in Jan 2018  I checked a BMET  Recomm taht he increase lasix to 40  Since seen he says his breathing has gotten a little better  But when he walks around pond a few times he is winded He gets chest tightness with this  Wife says he is not watching salt intaker   Seen by Dr Nyland  A1C was over 8 Has appt with R Byrum coming up    Current Meds  Medication Sig  . amitriptyline (ELAVIL) 150 MG tablet Take 150 mg by mouth at bedtime.    . amLODipine (NORVASC) 5 MG tablet Take 5 mg by mouth daily.    . aspirin EC 81 MG tablet Take 1 tablet (81 mg total) by mouth daily.  . dapagliflozin propanediol (FARXIGA) 10 MG TABS tablet Take 10 mg by mouth daily.  . diclofenac sodium (VOLTAREN) 1 % GEL Apply 1 application topically 4 (four) times daily. AS NEEDED FOR PAIN  . furosemide (LASIX) 40 MG tablet Take 40 mg by mouth.  . glipiZIDE (GLUCOTROL) 10 MG tablet Take 10 mg by mouth 2 (two) times daily before a meal.  . hydrocortisone 2.5 % cream Apply 1 application topically 2 (two) times daily.  . levETIRAcetam (KEPPRA) 250 MG tablet TAKE 1 1/2 TABLET BY MOUTH TWICE DAILY  . LORazepam (ATIVAN) 1 MG tablet Take 1 mg by mouth 2 (two) times daily. anxiety  . losartan (COZAAR) 50 MG tablet Take 1 tablet (50 mg total) by mouth daily.  . metFORMIN (GLUCOPHAGE-XR) 500 MG 24 hr tablet Take 500 mg by mouth 2 (two) times daily.  . omeprazole (PRILOSEC) 40 MG capsule Take  40 mg by mouth daily.  . oxyCODONE-acetaminophen (PERCOCET) 10-325 MG per tablet Take 1 tablet by mouth every 6 (six) hours as needed. pain  . simvastatin (ZOCOR) 40 MG tablet Take 40 mg by mouth daily.  . sitaGLIPtin (JANUVIA) 100 MG tablet Take 100 mg by mouth daily.   . Tiotropium Bromide Monohydrate (SPIRIVA RESPIMAT) 2.5 MCG/ACT AERS Inhale 2 puffs into the lungs daily.  . tiZANidine (ZANAFLEX) 4 MG capsule Take 8-12 mg by mouth at bedtime.   . traZODone (DESYREL) 50 MG tablet Take 50 mg by mouth at bedtime.   . umeclidinium bromide (INCRUSE ELLIPTA) 62.5 MCG/INH AEPB Inhale 1 puff into the lungs daily.     Allergies:   Hydromorphone hcl er; Nucynta [tapentadol hydrochloride]; Exalamide; and Tapentadol   Past Medical History:  Diagnosis Date  . Acute renal failure ([facility])    in setting of NSAID use and orthopedic surgery 2010  . Anxiety and depression   . Chronic diastolic CHF (congestive heart failure) ([facility])    a. Echo 6/17: severe conc LVH, vigorous EF, EF 65-70%, no dynamic obstruction, no RWMA, Gr 1 DD, mild TR  //    b. LHC 8/17: no sig CAD, LVEDP 28  . DM2 (diabetes mellitus, type 2) ([place])   . Family history of early CAD   . History of amputation of foot ([place])    L trans-met // R toe  . History of cardiac catheterization    a. [place] 2002: irregs  //  b. LHC in 8/17: no sig CAD, apical DK, hyperdynamic LV, LVEDP 28  . History of nuclear stress test    a. Nuc 7/17: Overall, intermediate risk nuclear stress test secondary to small size of apical lateral defect and reduced ejection fraction.  EF 43%  . HLD (hyperlipidemia)   . HTN (hypertension)   . Injuries     crushing injury to both his feet in February 2010.   Marland Kitchen Kidney calculi   . Tobacco abuse     Past Surgical History:  Procedure Laterality Date  . CARDIAC CATHETERIZATION N/A 01/22/2016   Procedure: Left Heart Cath and Coronary Angiography;  Surgeon: Peter M Martinique, MD;  Location: [facility];  Service:  Cardiovascular;  Laterality: N/A;  . FOOT AMPUTATION     bilateral  . LITHOTRIPSY    . TONSILLECTOMY       Social History:  The patient  reports that he has been smoking Cigarettes.  He has a 10.00 pack-year smoking history. He has never used smokeless tobacco. He reports that he drinks alcohol. He reports that he does not use drugs.   Family History:  The patient's family history includes Heart attack (age of onset: 17) in his brother; Heart attack (age of onset: 86) in his brother; Hypertension in his brother and sister; Leukemia (age of onset: 23) in his mother; Lung cancer (age of onset: 12) in his father.    ROS:  Please see the history of present illness. All other systems are reviewed and  Negative to the above problem except as noted.    PHYSICAL EXAM: VS:  BP 126/74   Pulse (!) 102   Ht 6' (1.829 m)   Wt 242 lb 12.8 oz (110.1 kg)   SpO2 95%   BMI 32.93 kg/m   GEN: Obese 56 yo, in no acute distress  HEENT: normal  Neck: no JVD, carotid bruits, or masses Cardiac: RRR; no murmurs, rubs, or gallops,tr edema  Respiratory:  clear to auscultation bilaterally, normal work of breathing GI: soft, nontender, nondistended, + BS  No hepatomegaly  MS: no deformity Moving all extremities   Skin: warm and dry, no rash Neuro:  Strength and sensation are intact Psych: euthymic mood, full affect   EKG:  EKG is not ordered today.   Lipid Panel No results found for: CHOL, TRIG, HDL, CHOLHDL, VLDL, LDLCALC, LDLDIRECT    Wt Readings from Last 3 Encounters:  08/27/16 242 lb 12.8 oz (110.1 kg)  07/13/16 257 lb (116.6 kg)  07/02/16 254 lb 12.8 oz (115.6 kg)      ASSESSMENT AND PLAN:  1  Diastolic CHF  Cath last year showed vigorous LV with apical dyskinesis  LVEDP was severely elevated at 38  No dsignif CAD  Volume on exam is difficut to assess  Wt is down though from last fall WIll check labs  I would also recomm increasing toprol XL to 75 mg to control HR better   Cut  amlodipine to 2.5 Sent to dietary for salt and glucose education Stay active  2  HTN  BP is OK  3  HCM  Will check lipdis today  F/U in June      Current medicines are reviewed at length with the patient today.  The patient does not have concerns regarding medicines.  Signed, [name], MD  08/27/2016 8:15 AM    [facility] [address], [place], [place]  [address] Phone: [phone]; Fax: [phone]

## 2016-08-27 ENCOUNTER — Ambulatory Visit (INDEPENDENT_AMBULATORY_CARE_PROVIDER_SITE_OTHER): Payer: Medicare Other | Admitting: Internal Medicine

## 2016-08-27 ENCOUNTER — Encounter (INDEPENDENT_AMBULATORY_CARE_PROVIDER_SITE_OTHER): Payer: Self-pay

## 2016-08-27 ENCOUNTER — Encounter: Payer: Self-pay | Admitting: Internal Medicine

## 2016-08-27 VITALS — BP 126/74 | HR 102 | Ht 72.0 in | Wt 242.8 lb

## 2016-08-27 DIAGNOSIS — I10 Essential (primary) hypertension: Secondary | ICD-10-CM | POA: Diagnosis not present

## 2016-08-27 DIAGNOSIS — E7800 Pure hypercholesterolemia, unspecified: Secondary | ICD-10-CM | POA: Diagnosis not present

## 2016-08-27 DIAGNOSIS — E118 Type 2 diabetes mellitus with unspecified complications: Secondary | ICD-10-CM

## 2016-08-27 MED ORDER — METOPROLOL SUCCINATE ER 50 MG PO TB24: 50.0000 mg | ORAL_TABLET | Freq: Every day | ORAL | 3 refills | Status: DC

## 2016-08-27 MED ORDER — AMLODIPINE BESYLATE 2.5 MG PO TABS: 2.5000 mg | ORAL_TABLET | Freq: Every day | ORAL | 3 refills | Status: DC

## 2016-08-27 MED ORDER — METOPROLOL SUCCINATE ER 25 MG PO TB24: 25.0000 mg | ORAL_TABLET | Freq: Every day | ORAL | 3 refills | Status: DC

## 2016-08-27 NOTE — Patient Instructions (Addendum)
Medication Instructions:  INCREASE metoprolol (Toprol Xl) to 75 mg daily.  DECREASE amlodipine to 2.5 mg daily.  Labwork: LABS TODAY: BMET, proBNP, LIPIDS  Testing/Procedures: None ordered  Follow-Up: You have been referred to dietary and nutrition for glucose and sodium education.   Your physician recommends that you schedule a follow-up appointment in: June 2018 with Dr. [name].   Any Other Special Instructions Will Be Listed Below (If Applicable).   Carbohydrate Counting for Diabetes Mellitus, Adult Carbohydrate counting is a method for keeping track of how many carbohydrates you eat. Eating carbohydrates naturally increases the amount of sugar (glucose) in the blood. Counting how many carbohydrates you eat helps keep your blood glucose within normal limits, which helps you manage your diabetes (diabetes mellitus). It is important to know how many carbohydrates you can safely have in each meal. This is different for every person. A diet and nutrition specialist (registered dietitian) can help you make a meal plan and calculate how many carbohydrates you should have at each meal and snack. Carbohydrates are found in the following foods:  Grains, such as breads and cereals.  Dried beans and soy products.  Starchy vegetables, such as potatoes, peas, and corn.  Fruit and fruit juices.  Milk and yogurt.  Sweets and snack foods, such as cake, cookies, candy, chips, and soft drinks. How do I count carbohydrates? There are two ways to count carbohydrates in food. You can use either of the methods or a combination of both. Reading "Nutrition Facts" on packaged food  The "Nutrition Facts" list is included on the labels of almost all packaged foods and beverages in the [place] It includes:  The serving size.  Information about nutrients in each serving, including the grams (g) of carbohydrate per serving. To use the "Nutrition Facts":  Decide how many servings you will  have.  Multiply the number of servings by the number of carbohydrates per serving.  The resulting number is the total amount of carbohydrates that you will be having. Learning standard serving sizes of other foods  When you eat foods containing carbohydrates that are not packaged or do not include "Nutrition Facts" on the label, you need to measure the servings in order to count the amount of carbohydrates:  Measure the foods that you will eat with a food scale or measuring cup, if needed.  Decide how many standard-size servings you will eat.  Multiply the number of servings by 15. Most carbohydrate-rich foods have about 15 g of carbohydrates per serving.  For example, if you eat 8 oz (170 g) of strawberries, you will have eaten 2 servings and 30 g of carbohydrates (2 servings x 15 g = 30 g).  For foods that have more than one food mixed, such as soups and casseroles, you must count the carbohydrates in each food that is included. The following list contains standard serving sizes of common carbohydrate-rich foods. Each of these servings has about 15 g of carbohydrates:   hamburger bun or  English muffin.   oz (15 mL) syrup.   oz (14 g) jelly.  1 slice of bread.  1 six-inch tortilla.  3 oz (85 g) cooked rice or pasta.  4 oz (113 g) cooked dried beans.  4 oz (113 g) starchy vegetable, such as peas, corn, or potatoes.  4 oz (113 g) hot cereal.  4 oz (113 g) mashed potatoes or  of a large baked potato.  4 oz (113 g) canned or frozen fruit.  4 oz (  120 mL) fruit juice.  4-6 crackers.  6 chicken nuggets.  6 oz (170 g) unsweetened dry cereal.  6 oz (170 g) plain fat-free yogurt or yogurt sweetened with artificial sweeteners.  8 oz (240 mL) milk.  8 oz (170 g) fresh fruit or one small piece of fruit.  24 oz (680 g) popped popcorn. Example of carbohydrate counting Sample meal   3 oz (85 g) chicken breast.  6 oz (170 g) brown rice.  4 oz (113 g) corn.  8 oz  (240 mL) milk.  8 oz (170 g) strawberries with sugar-free whipped topping. Carbohydrate calculation  1. Identify the foods that contain carbohydrates:  Rice.  Corn.  Milk.  Strawberries. 2. Calculate how many servings you have of each food:  2 servings rice.  1 serving corn.  1 serving milk.  1 serving strawberries. 3. Multiply each number of servings by 15 g:  2 servings rice x 15 g = 30 g.  1 serving corn x 15 g = 15 g.  1 serving milk x 15 g = 15 g.  1 serving strawberries x 15 g = 15 g. 4. Add together all of the amounts to find the total grams of carbohydrates eaten:  30 g + 15 g + 15 g + 15 g = 75 g of carbohydrates total. This information is not intended to replace advice given to you by your health care provider. Make sure you discuss any questions you have with your health care provider. Document Released: 06/07/2005 Document Revised: 12/26/2015 Document Reviewed: 11/19/2015 Elsevier Interactive Patient Education  2017 [employer].  Low-Sodium Eating Plan Sodium, which is an element that makes up salt, helps you maintain a healthy balance of fluids in your body. Too much sodium can increase your blood pressure and cause fluid and waste to be held in your body. Your health care provider or dietitian may recommend following this plan if you have high blood pressure (hypertension), kidney disease, liver disease, or heart failure. Eating less sodium can help lower your blood pressure, reduce swelling, and protect your heart, liver, and kidneys. What are tips for following this plan? General guidelines   Most people on this plan should limit their sodium intake to 1,500-2,000 mg (milligrams) of sodium each day. Reading food labels   The Nutrition Facts label lists the amount of sodium in one serving of the food. If you eat more than one serving, you must multiply the listed amount of sodium by the number of servings.  Choose foods with less than 140 mg of sodium  per serving.  Avoid foods with 300 mg of sodium or more per serving. Shopping   Look for lower-sodium products, often labeled as "low-sodium" or "no salt added."  Always check the sodium content even if foods are labeled as "unsalted" or "no salt added".  Buy fresh foods.  Avoid canned foods and premade or frozen meals.  Avoid canned, cured, or processed meats  Buy breads that have less than 80 mg of sodium per slice. Cooking   Eat more home-cooked food and less restaurant, buffet, and fast food.  Avoid adding salt when cooking. Use salt-free seasonings or herbs instead of table salt or sea salt. Check with your health care provider or pharmacist before using salt substitutes.  Cook with plant-based oils, such as canola, sunflower, or olive oil. Meal planning   When eating at a restaurant, ask that your food be prepared with less salt or no salt, if possible.  Avoid foods that contain MSG (monosodium glutamate). MSG is sometimes added to [place] food, bouillon, and some canned foods. What foods are recommended? The items listed may not be a complete list. Talk with your dietitian about what dietary choices are best for you. Grains  Low-sodium cereals, including oats, puffed wheat and rice, and shredded wheat. Low-sodium crackers. Unsalted rice. Unsalted pasta. Low-sodium bread. Whole-grain breads and whole-grain pasta. Vegetables  Fresh or frozen vegetables. "No salt added" canned vegetables. "No salt added" tomato sauce and paste. Low-sodium or reduced-sodium tomato and vegetable juice. Fruits  Fresh, frozen, or canned fruit. Fruit juice. Meats and other protein foods  Fresh or frozen (no salt added) meat, poultry, seafood, and fish. Low-sodium canned tuna and salmon. Unsalted nuts. Dried peas, beans, and lentils without added salt. Unsalted canned beans. Eggs. Unsalted nut butters. Dairy  Milk. Soy milk. Cheese that is naturally low in sodium, such as ricotta cheese, fresh  mozzarella, or Swiss cheese Low-sodium or reduced-sodium cheese. Cream cheese. Yogurt. Fats and oils  Unsalted butter. Unsalted margarine with no trans fat. Vegetable oils such as canola or olive oils. Seasonings and other foods  Fresh and dried herbs and spices. Salt-free seasonings. Low-sodium mustard and ketchup. Sodium-free salad dressing. Sodium-free light mayonnaise. Fresh or refrigerated horseradish. Lemon juice. Vinegar. Homemade, reduced-sodium, or low-sodium soups. Unsalted popcorn and pretzels. Low-salt or salt-free chips. What foods are not recommended? The items listed may not be a complete list. Talk with your dietitian about what dietary choices are best for you. Grains  Instant hot cereals. Bread stuffing, pancake, and biscuit mixes. Croutons. Seasoned rice or pasta mixes. Noodle soup cups. Boxed or frozen macaroni and cheese. Regular salted crackers. Self-rising flour. Vegetables  Sauerkraut, pickled vegetables, and relishes. Olives. [place] fries. Onion rings. Regular canned vegetables (not low-sodium or reduced-sodium). Regular canned tomato sauce and paste (not low-sodium or reduced-sodium). Regular tomato and vegetable juice (not low-sodium or reduced-sodium). Frozen vegetables in sauces. Meats and other protein foods  Meat or fish that is salted, canned, smoked, spiced, or pickled. Bacon, ham, sausage, hotdogs, corned beef, chipped beef, packaged lunch meats, salt pork, jerky, pickled herring, anchovies, regular canned tuna, sardines, salted nuts. Dairy  Processed cheese and cheese spreads. Cheese curds. Blue cheese. Feta cheese. String cheese. Regular cottage cheese. Buttermilk. Canned milk. Fats and oils  Salted butter. Regular margarine. Ghee. Bacon fat. Seasonings and other foods  Onion salt, garlic salt, seasoned salt, table salt, and sea salt. Canned and packaged gravies. Worcestershire sauce. Tartar sauce. Barbecue sauce. Teriyaki sauce. Soy sauce, including  reduced-sodium. Steak sauce. Fish sauce. Oyster sauce. Cocktail sauce. Horseradish that you find on the shelf. Regular ketchup and mustard. Meat flavorings and tenderizers. Bouillon cubes. Hot sauce and Tabasco sauce. Premade or packaged marinades. Premade or packaged taco seasonings. Relishes. Regular salad dressings. Salsa. Potato and tortilla chips. Corn chips and puffs. Salted popcorn and pretzels. Canned or dried soups. Pizza. Frozen entrees and pot pies. Summary  Eating less sodium can help lower your blood pressure, reduce swelling, and protect your heart, liver, and kidneys.  Most people on this plan should limit their sodium intake to 1,500-2,000 mg (milligrams) of sodium each day.  Canned, boxed, and frozen foods are high in sodium. Restaurant foods, fast foods, and pizza are also very high in sodium. You also get sodium by adding salt to food.  Try to cook at home, eat more fresh fruits and vegetables, and eat less fast food, canned, processed, or prepared foods. This information is  not intended to replace advice given to you by your health care provider. Make sure you discuss any questions you have with your health care provider. Document Released: 11/27/2001 Document Revised: 05/31/2016 Document Reviewed: 05/31/2016 Elsevier Interactive Patient Education  2017 [employer].    If you need a refill on your cardiac medications before your next appointment, please call your pharmacy.

## 2016-08-29 LAB — BASIC METABOLIC PANEL
BUN / CREAT RATIO: 13 (ref 9–20)
BUN: 10 mg/dL (ref 6–24)
CHLORIDE: 97 mmol/L (ref 96–106)
CO2: 23 mmol/L (ref 18–29)
Calcium: 9.2 mg/dL (ref 8.7–10.2)
Creatinine, Ser: 0.75 mg/dL — ABNORMAL LOW (ref 0.76–1.27)
GFR, EST AFRICAN AMERICAN: 119 mL/min/{1.73_m2} (ref >59)
GFR, EST NON AFRICAN AMERICAN: 103 mL/min/{1.73_m2} (ref >59)
Glucose: 178 mg/dL — ABNORMAL HIGH (ref 65–99)
POTASSIUM: 4 mmol/L (ref 3.5–5.2)
Sodium: 139 mmol/L (ref 134–144)

## 2016-08-29 LAB — PRO B NATRIURETIC PEPTIDE: NT-Pro BNP: 126 pg/mL (ref 0–210)

## 2016-08-29 LAB — LIPID PANEL
CHOL/HDL RATIO: 3.8 ratio (ref 0.0–5.0)
Cholesterol, Total: 124 mg/dL (ref 100–199)
HDL: 33 mg/dL — ABNORMAL LOW (ref >39)
LDL CALC: 57 mg/dL (ref 0–99)
Triglycerides: 168 mg/dL — ABNORMAL HIGH (ref 0–149)
VLDL Cholesterol Cal: 34 mg/dL (ref 5–40)

## 2016-09-02 ENCOUNTER — Other Ambulatory Visit: Payer: Self-pay | Admitting: *Deleted

## 2016-09-02 MED ORDER — METOPROLOL SUCCINATE ER 50 MG PO TB24: ORAL_TABLET | ORAL | 2 refills | Status: DC

## 2016-09-02 NOTE — Telephone Encounter (Signed)
GOT A REQUEST FROM THE PHARMACY ASKING TO CHANGE METOPROLOL 50 MG TO 1.5 TABS (75 MG) PO QD SO PT ONLY HAD ONE RX AND ONE COPAY, PT WAS ON METOP 50 MG & METOP 25 MG FOR A TOTAL OF METOP 75 MG DAILY. PER MICHALENE IT WAS OK TO CHANGE TO WHAT THE PHARMACY ASK OF [name].

## 2016-09-17 ENCOUNTER — Telehealth: Payer: Self-pay | Admitting: Internal Medicine

## 2016-09-17 NOTE — Telephone Encounter (Signed)
New message       Wife want to talk to a nurse.  Pt is not sleeping and pt cannot lie flat.  She does not know if he has "trapped gas" or if he is having chest pain.  Wife states pt will not tell her anything but she knows something is wrong.  Please call this afternoon

## 2016-09-17 NOTE — Telephone Encounter (Signed)
Returned call to patient's wife.She stated she is worried about her husband.He is having chest pressure off and on for the past 2 to 3 weeks.Stated he will not tell her when he is having chest pressure.Appointment scheduled with Cecilie Kicks NP 09/21/16 at 11:00 am.

## 2016-09-20 NOTE — Progress Notes (Signed)
Cardiology Office Note   Date:  09/21/2016   ID:  DEKE TILGHMAN, DOB 1960/08/02, MRN [id number]  PCP:  [name], MD  Cardiologist:  Dr. [name]    Chief Complaint  Patient presents with  . Chest Pain      History of Present Illness: Troy [name] is a 56 y.o. male who presents for chest pressure.    He has a hx of diastolic CHF, DM, HL HTN. Cath in 2002 normal LVEF Luminal irreg . Most recent cath in 2017- August with minimal CAD but elevated LVEDP.     Today he and his wife have complaints of increase belching.  Wakes him from sleep and he has chest pressure and fullness, some SOB and if he walks around it improves.  He has increased belching which helps.  This has been going on for 3 weeks off and on not every night.   He has had some constipation.   His HR today is 104 and it is elevated on prior visits as well.   He does not use much salt.  He is still smoking 1 pk lasts 3 days and he does have plans to stop.   No real change in diet.  His wife is concerned, he has family hx where 2 brothers died with heart disease.  Pt on disability for foot amputation.    His diabetes is not well controlled and to see endocrinologist this week.  Pt does not much do much activity so he is not sure about DOE.   Past Medical History:  Diagnosis Date  . Acute renal failure ([place])    in setting of NSAID use and orthopedic surgery 2010  . Anxiety and depression   . Chronic diastolic CHF (congestive heart failure) ([place])    a. Echo 6/17: severe conc LVH, vigorous EF, EF 65-70%, no dynamic obstruction, no RWMA, Gr 1 DD, mild TR  //  b. LHC 8/17: no sig CAD, LVEDP 28  . DM2 (diabetes mellitus, type 2) ([place])   . Family history of early CAD   . 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 History of cardiac catheterization    a. [place] 2002: irregs  //  b. LHC in 8/17: no sig CAD, apical DK, hyperdynamic LV, LVEDP 28  . History of nuclear stress test    a. Nuc 7/17:  Overall, intermediate risk nuclear stress test secondary to small size of apical lateral defect and reduced ejection fraction.  EF 43%  . HLD (hyperlipidemia)   . HTN (hypertension)   . Injuries     crushing injury to both his feet in February 2010.   Marland Kitchen Kidney calculi   . Tobacco abuse     Past Surgical History:  Procedure Laterality Date  . CARDIAC CATHETERIZATION N/A 01/22/2016   Procedure: Left Heart Cath and Coronary Angiography;  Surgeon: Peter M Martinique, MD;  Location: [facility];  Service: Cardiovascular;  Laterality: N/A;  . FOOT AMPUTATION     bilateral  . LITHOTRIPSY    . TONSILLECTOMY       Current Outpatient Prescriptions  Medication Sig Dispense Refill  . amitriptyline (ELAVIL) 150 MG tablet Take 150 mg by mouth at bedtime.      Marland Kitchen amLODipine (NORVASC) 2.5 MG tablet Take 1 tablet (2.5 mg total) by mouth daily. 180 tablet 3  . aspirin EC 81 MG tablet Take 1 tablet (81 mg total) by mouth daily.  90 tablet 3  . dapagliflozin propanediol (FARXIGA) 10 MG TABS tablet Take 10 mg by mouth daily.    . diclofenac sodium (VOLTAREN) 1 % GEL Apply 1 application topically 4 (four) times daily. AS NEEDED FOR PAIN    . furosemide (LASIX) 40 MG tablet Take 40 mg by mouth.    Marland Kitchen glipiZIDE (GLUCOTROL) 10 MG tablet Take 10 mg by mouth 2 (two) times daily before a meal.    . hydrocortisone 2.5 % cream Apply 1 application topically 2 (two) times daily.    Marland Kitchen levETIRAcetam (KEPPRA) 250 MG tablet TAKE 1 1/2 TABLET BY MOUTH TWICE DAILY    . LORazepam (ATIVAN) 1 MG tablet Take 1 mg by mouth 2 (two) times daily. anxiety    . losartan (COZAAR) 50 MG tablet Take 1 tablet (50 mg total) by mouth daily. 90 tablet 3  . metFORMIN (GLUCOPHAGE-XR) 500 MG 24 hr tablet Take 500 mg by mouth 2 (two) times daily.    . metoprolol succinate (TOPROL-XL) 50 MG 24 hr tablet TAKE 1.5 (75 MG) TABLETS BY MOUTH DAILY. Take with or immediately following a meal. 135 tablet 2  . omeprazole (PRILOSEC) 40 MG capsule Take  40 mg by mouth daily.    Marland Kitchen oxyCODONE-acetaminophen (PERCOCET) 10-325 MG per tablet Take 1 tablet by mouth every 6 (six) hours as needed. pain    . potassium chloride SA (K-DUR,KLOR-CON) 20 MEQ tablet Take 20 mEq by mouth daily.    . simvastatin (ZOCOR) 40 MG tablet Take 40 mg by mouth daily.    . sitaGLIPtin (JANUVIA) 100 MG tablet Take 100 mg by mouth daily.     . Tiotropium Bromide Monohydrate 2.5 MCG/ACT AERS Inhale 1 puff into the lungs daily.    Marland Kitchen tiZANidine (ZANAFLEX) 4 MG capsule Take 8-12 mg by mouth at bedtime.     . traZODone (DESYREL) 50 MG tablet Take 50 mg by mouth at bedtime.      No current facility-administered medications for this visit.     Allergies:   Hydromorphone hcl er; Nucynta [tapentadol hydrochloride]; Exalamide; and Tapentadol    Social History:  The patient  reports that he has been smoking Cigarettes.  He has a 10.00 pack-year smoking history. He has never used smokeless tobacco. He reports that he drinks alcohol. He reports that he does not use drugs.   Family History:  The patient's family history includes Heart attack (age of onset: 36) in his brother; Heart attack (age of onset: 30) in his brother; Hypertension in his brother and sister; Leukemia (age of onset: 85) in his mother; Lung cancer (age of onset: 47) in his father.    ROS:  General:no colds or fevers, no weight changes Skin:no rashes or ulcers HEENT:no blurred vision, no congestion CV:see HPI PUL:see HPI GI:no diarrhea some constipation no melena, no indigestion GU:no hematuria, no dysuria MS:no joint pain, no claudication Neuro:no syncope, no lightheadedness Endo:+ diabetes, no thyroid disease  Wt Readings from Last 3 Encounters:  09/21/16 245 lb 12.8 oz (111.5 kg)  08/27/16 242 lb 12.8 oz (110.1 kg)  07/13/16 257 lb (116.6 kg)     PHYSICAL EXAM: VS:  BP 138/72   Pulse (!) 104   Ht 6' (1.829 m)   Wt 245 lb 12.8 oz (111.5 kg)   SpO2 94%   BMI 33.34 kg/m  , BMI Body mass index is  33.34 kg/m. General:Pleasant affect, NAD Skin:Warm and dry, brisk capillary refill HEENT:normocephalic, sclera clear, mucus membranes moist Neck:supple, no JVD,  no bruits  Heart:S1S2 RRR with 1/6 systolic murmur, gallup, rub or click, rapid HR Lungs:clear without rales, rhonchi, or wheezes MVV:KPQAE,SLPN, non tender, + BS, do not palpate liver spleen or masses Ext:no lower ext edema, 2+ pedal pulses, 2+ radial pulses Neuro:alert and oriented X3, MAE, follows commands, + facial symmetry  SP02 at rest 94% and with walking 97%, though his HR increased to 132 beats per min.   EKG:  EKG is ordered today. The ekg ordered today demonstrates SR with ST wave abnormality but no changes from previous.   Recent Labs: 01/15/2016: Brain Natriuretic Peptide 30.9; Hemoglobin 14.7 07/02/2016: Platelets WILL FOLLOW 08/27/2016: BUN 10; Creatinine, Ser 0.75; NT-Pro BNP 126; Potassium 4.0; Sodium 139    Lipid Panel    Component Value Date/Time   CHOL 124 08/27/2016 0845   TRIG 168 (H) 08/27/2016 0845   HDL 33 (L) 08/27/2016 0845   CHOLHDL 3.8 08/27/2016 0845   LDLCALC 57 08/27/2016 0845       Other studies Reviewed: Additional studies/ records that were reviewed today include: . Procedures   Left Heart Cath and Coronary Angiography  Conclusion     There is hyperdynamic overall left ventricular systolic function with apical dyskinesis.  LV end diastolic pressure is severely elevated.  The left ventricular ejection fraction is greater than 65% by visual estimate.  No significant CAD   1. No significant CAD 2. Apical dyskinesis with otherwise hyperdynamic LV contractility. Etiology of apical wall motion abnormality is unclear. 3. Elevated LV EDP of 28 mm Hg.   Plan: will initiate diuretic therapy for elevated EDP. DC home today with close cardiology follow up.     Echo 12/16/15 Study Conclusions  - Left ventricle: The cavity size was normal. There was severe   concentric  hypertrophy. Systolic function was vigorous. The   estimated ejection fraction was in the range of 65% to 70%. There   was no dynamic obstruction. Wall motion was normal; there were no   regional wall motion abnormalities. Doppler parameters are   consistent with abnormal left ventricular relaxation (grade 1   diastolic dysfunction). Doppler parameters are consistent with   high ventricular filling pressure. - Mitral valve: Calcified annulus. - Tricuspid valve: There was mild regurgitation.  Impressions:  - No change when compared to prior.  ASSESSMENT AND PLAN:  1.  Chest fullness/ SOB, I believe this is due to diastolic HF, acute on chronic HF. Will increase lasix to BID for 2 days, re-check BNP and BMP and check CXR.  He will way every day and call if wt climbs.  Will also increase prilosec to BID for 1 week.  He will follow up on the 16th though if no wt loss he will call.  On next visit may increase toprol to 100 mg daily.   2.  Minimal CAD with last cath 2017 very minimal disease.   3. DM-2 to see endocrinologist this week.  4. Chronic diastolic HF.  5. Tobacco use, asked pt to stop smoking.    Current medicines are reviewed with the patient today.  The patient Has no concerns regarding medicines.  The following changes have been made:  See above Labs/ tests ordered today include:see above  Disposition:   FU:  see above  Signed, Cecilie Kicks, NP  09/21/2016 11:41 AM    [facility] [address], [place], [place], [place] Phone: [phone]; Fax: [phone]

## 2016-09-21 ENCOUNTER — Encounter: Payer: Self-pay | Admitting: Cardiology

## 2016-09-21 ENCOUNTER — Ambulatory Visit (INDEPENDENT_AMBULATORY_CARE_PROVIDER_SITE_OTHER): Payer: Medicare Other | Admitting: Cardiology

## 2016-09-21 VITALS — BP 138/72 | HR 104 | Ht 72.0 in | Wt 245.8 lb

## 2016-09-21 DIAGNOSIS — R0602 Shortness of breath: Secondary | ICD-10-CM

## 2016-09-21 DIAGNOSIS — I10 Essential (primary) hypertension: Secondary | ICD-10-CM | POA: Diagnosis not present

## 2016-09-21 DIAGNOSIS — I5033 Acute on chronic diastolic (congestive) heart failure: Secondary | ICD-10-CM | POA: Diagnosis not present

## 2016-09-21 DIAGNOSIS — I519 Heart disease, unspecified: Secondary | ICD-10-CM

## 2016-09-21 DIAGNOSIS — E118 Type 2 diabetes mellitus with unspecified complications: Secondary | ICD-10-CM

## 2016-09-21 DIAGNOSIS — R0600 Dyspnea, unspecified: Secondary | ICD-10-CM

## 2016-09-21 DIAGNOSIS — Z720 Tobacco use: Secondary | ICD-10-CM | POA: Diagnosis not present

## 2016-09-21 DIAGNOSIS — I5189 Other ill-defined heart diseases: Secondary | ICD-10-CM

## 2016-09-21 LAB — BASIC METABOLIC PANEL
BUN / CREAT RATIO: 15 (ref 9–20)
BUN: 13 mg/dL (ref 6–24)
CO2: 20 mmol/L (ref 18–29)
CREATININE: 0.85 mg/dL (ref 0.76–1.27)
Calcium: 9.5 mg/dL (ref 8.7–10.2)
Chloride: 97 mmol/L (ref 96–106)
GFR calc Af Amer: 113 mL/min/{1.73_m2} (ref >59)
GFR, EST NON AFRICAN AMERICAN: 97 mL/min/{1.73_m2} (ref >59)
Glucose: 162 mg/dL — ABNORMAL HIGH (ref 65–99)
Potassium: 4.6 mmol/L (ref 3.5–5.2)
SODIUM: 140 mmol/L (ref 134–144)

## 2016-09-21 LAB — PRO B NATRIURETIC PEPTIDE: NT-Pro BNP: 133 pg/mL (ref 0–210)

## 2016-09-21 NOTE — Patient Instructions (Addendum)
Medication Instructions:  1. INCREASE LASIX TO 40 MG TWICE DAILY FOR 2 DAYS THEN RESUME LASIX 40 MG DAILY  2. INCREASE PROTONIX 40 MG TWICE DAILY FOR 1 WEEK THEN RESUME PROTONIX 40 MG DAILY  Labwork: 1. TODAY BMET, PRO BNP  Testing/Procedures: CHEST X-RAY TODAY AT [facility]  Follow-Up: Cecilie Kicks, NP 10/04/16 @ 8 AM  Any Other Special Instructions Will Be Listed Below (If Applicable).  WEIGH DAILY AND CALL IF YOUR WEIGHT IS UP 2-3 LB'S IN 1 DAY OR 5 LB'S IN 1 WEEK    If you need a refill on your cardiac medications before your next appointment, please call your pharmacy.

## 2016-09-24 ENCOUNTER — Telehealth: Payer: Self-pay | Admitting: Internal Medicine

## 2016-09-24 NOTE — Telephone Encounter (Signed)
New message    Cecilie Kicks told wife to call back and let know how pt was doing. Per wife, he hasn't gained any weight, no fluid retention. He did have some stomach tightness last night-she said it all pushes up.

## 2016-09-24 NOTE — Telephone Encounter (Signed)
Per patient's wife, pt did not weigh at home yesterday or today.   The weight yesterday at endocrinology was 345 lb, same as it was on 09/21/16 when he saw Cecilie Kicks, NP.  He is still having abdominal pressure and belching.  Increase pantoprazole to 40 mg BID.  Advised to weigh daily on home scale and record.  Advised to call if weight increases >3 lb overnight or 5 lbs in 1 week.  She is aware I am forwarding to Mickel Baas for review.

## 2016-09-25 NOTE — Telephone Encounter (Signed)
Take 2 more days of extra lasix to see if helps.

## 2016-09-27 ENCOUNTER — Ambulatory Visit: Payer: Medicare Other | Admitting: Registered"

## 2016-10-01 NOTE — Progress Notes (Signed)
Cardiology Office Note   Date:  10/04/2016   ID:  Troy [name], DOB Oct 28, 1960, MRN [id number]  PCP:  [name], MD  Cardiologist:    Dr. [name]  Chief Complaint  Patient presents with  . Shortness of Breath      History of Present Illness: Troy [name] is a 56 y.o. male who presents for SOB and tachycardia after increase of lasix.    He has a hx of diastolic CHF, DM, HL HTN. Cath in 2002 normal LVEF Luminal irreg . Most recent cath in 2017- August with minimal CAD but elevated LVEDP.     He was seen 09/21/16 for SOB and fullness.  Increased belching.  HR increases when he walked to 130.  SP02 was normal.  I increased his lasix , CXR stable, and plan to increase BB if needed on follow up visit.  His DM is to be managed by Endocrinologist.   And he is aware he needs to stop smoking.   Today he thinks his SOB improved with the extra lasix and his HR has improved.  He has more SOB with exertion now.  Difficult to get much from the pt, his wife gives more answers.  We discussed tobacco cessation.  He has had PFTs with Dr. Lamonte Sakai and is to follow up soon.  We discussed importance of exercise and deconditioning and how abd. Fat increases SOB.     With walking HR in 80s improved from previous.  He is diabetic and Januvia has been stopped and Trulicity has been started.    Past Medical History:  Diagnosis Date  . Acute renal failure ([place])    in setting of NSAID use and orthopedic surgery 2010  . Anxiety and depression   . Chronic diastolic CHF (congestive heart failure) ([place])    a. Echo 6/17: severe conc LVH, vigorous EF, EF 65-70%, no dynamic obstruction, no RWMA, Gr 1 DD, mild TR  //  b. LHC 8/17: no sig CAD, LVEDP 28  . DM2 (diabetes mellitus, type 2) ([place])   . Family history of early CAD   . 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 History of cardiac catheterization    a. [place] 2002: irregs  //  b. LHC in 8/17: no sig CAD, apical DK, hyperdynamic  LV, LVEDP 28  . History of nuclear stress test    a. Nuc 7/17: Overall, intermediate risk nuclear stress test secondary to small size of apical lateral defect and reduced ejection fraction.  EF 43%  . HLD (hyperlipidemia)   . HTN (hypertension)   . Injuries     crushing injury to both his feet in February 2010.   Marland Kitchen Kidney calculi   . Tobacco abuse     Past Surgical History:  Procedure Laterality Date  . CARDIAC CATHETERIZATION N/A 01/22/2016   Procedure: Left Heart Cath and Coronary Angiography;  Surgeon: Peter M Martinique, MD;  Location: [facility];  Service: Cardiovascular;  Laterality: N/A;  . FOOT AMPUTATION     bilateral  . LITHOTRIPSY    . TONSILLECTOMY       Current Outpatient Prescriptions  Medication Sig Dispense Refill  . amitriptyline (ELAVIL) 150 MG tablet Take 150 mg by mouth at bedtime.      Marland Kitchen amLODipine (NORVASC) 2.5 MG tablet Take 1 tablet (2.5 mg total) by mouth daily. 180 tablet 3  . aspirin EC 81 MG tablet Take 1 tablet (81  mg total) by mouth daily. 90 tablet 3  . dapagliflozin propanediol (FARXIGA) 10 MG TABS tablet Take 10 mg by mouth daily.    . diclofenac sodium (VOLTAREN) 1 % GEL Apply 1 application topically 4 (four) times daily. AS NEEDED FOR PAIN    . Dulaglutide 0.75 MG/0.5ML SOPN Inject 1 Dose into the skin once a week.     . furosemide (LASIX) 40 MG tablet Take 40 mg by mouth.    Marland Kitchen glipiZIDE (GLUCOTROL) 10 MG tablet Take 10 mg by mouth 2 (two) times daily before a meal.    . hydrocortisone 2.5 % cream Apply 1 application topically 2 (two) times daily.    Marland Kitchen levETIRAcetam (KEPPRA) 250 MG tablet TAKE 1 1/2 TABLET BY MOUTH TWICE DAILY    . LORazepam (ATIVAN) 1 MG tablet Take 1 mg by mouth 2 (two) times daily. anxiety    . losartan (COZAAR) 50 MG tablet Take 1 tablet (50 mg total) by mouth daily. 90 tablet 3  . metFORMIN (GLUCOPHAGE-XR) 500 MG 24 hr tablet Take 500 mg by mouth 2 (two) times daily.    . metoprolol succinate (TOPROL-XL) 50 MG 24 hr  tablet TAKE 1.5 (75 MG) TABLETS BY MOUTH DAILY. Take with or immediately following a meal. 135 tablet 2  . omeprazole (PRILOSEC) 40 MG capsule Take 40 mg by mouth daily.    Marland Kitchen oxyCODONE-acetaminophen (PERCOCET) 10-325 MG per tablet Take 1 tablet by mouth every 6 (six) hours as needed. pain    . potassium chloride SA (K-DUR,KLOR-CON) 20 MEQ tablet Take 20 mEq by mouth daily.    . simvastatin (ZOCOR) 40 MG tablet Take 40 mg by mouth daily.    . Tiotropium Bromide Monohydrate 2.5 MCG/ACT AERS Inhale 1 puff into the lungs daily.    Marland Kitchen tiZANidine (ZANAFLEX) 4 MG capsule Take 8-12 mg by mouth at bedtime.     . traZODone (DESYREL) 50 MG tablet Take 50 mg by mouth at bedtime.      No current facility-administered medications for this visit.     Allergies:   Hydromorphone hcl er; Nucynta [tapentadol hydrochloride]; Exalamide; and Tapentadol    Social History:  The patient  reports that he has been smoking Cigarettes.  He has a 10.00 pack-year smoking history. He has never used smokeless tobacco. He reports that he drinks alcohol. He reports that he does not use drugs.   Family History:  The patient's family history includes Heart attack (age of onset: 81) in his brother; Heart attack (age of onset: 58) in his brother; Hypertension in his brother and sister; Leukemia (age of onset: 44) in his mother; Lung cancer (age of onset: 73) in his father.    ROS:  General:no colds or fevers, + weight up Skin:no rashes or ulcers HEENT:no blurred vision, no congestion CV:see HPI PUL:see HPI GI:no diarrhea constipation or melena, no indigestion GU:no hematuria, no dysuria MS:no joint pain, no claudication Neuro:no syncope, no lightheadedness Endo:+ diabetes per endocrine, no thyroid disease  Wt Readings from Last 3 Encounters:  10/04/16 248 lb 12.8 oz (112.9 kg)  09/21/16 245 lb 12.8 oz (111.5 kg)  08/27/16 242 lb 12.8 oz (110.1 kg)     PHYSICAL EXAM: VS:  BP 118/70 (BP Location: Left Arm, Patient  Position: Sitting, Cuff Size: Large)   Pulse 86   Ht 6' (1.829 m)   Wt 248 lb 12.8 oz (112.9 kg)   SpO2 97% Comment: resing and walking  BMI 33.74 kg/m  , BMI Body mass  index is 33.74 kg/m. General:Pleasant affect, NAD Skin:Warm and dry, brisk capillary refill HEENT:normocephalic, sclera clear, mucus membranes moist Neck:supple, no JVD, no bruits  Heart:S1S2 RRR without murmur, gallup, rub or click Lungs:clear without rales, rhonchi, or wheezes YFV:CBSWH, soft, non tender, + BS, do not palpate liver spleen or masses Ext:no lower ext edema, 2+ pedal pulses, 2+ radial pulses Neuro:alert and oriented, MAE, follows commands, + facial symmetry    EKG:  EKG is NOT ordered today.    Recent Labs: 01/15/2016: Brain Natriuretic Peptide 30.9; Hemoglobin 14.7 07/02/2016: Platelets WILL FOLLOW 09/21/2016: BUN 13; Creatinine, Ser 0.85; NT-Pro BNP 133; Potassium 4.6; Sodium 140    Lipid Panel    Component Value Date/Time   CHOL 124 08/27/2016 0845   TRIG 168 (H) 08/27/2016 0845   HDL 33 (L) 08/27/2016 0845   CHOLHDL 3.8 08/27/2016 0845   LDLCALC 57 08/27/2016 0845       Other studies Reviewed: Additional studies/ records that were reviewed today include: previous OV note, . Cardiac cath;  Conclusion     There is hyperdynamic overall left ventricular systolic function with apical dyskinesis.  LV end diastolic pressure is severely elevated.  The left ventricular ejection fraction is greater than 65% by visual estimate.  No significant CAD   1. No significant CAD 2. Apical dyskinesis with otherwise hyperdynamic LV contractility. Etiology of apical wall motion abnormality is unclear. 3. Elevated LV EDP of 28 mm Hg.   Plan: will initiate diuretic therapy for elevated EDP. DC home today with close cardiology follow up.     Study Conclusions  - Left ventricle: The cavity size was normal. There was severe   concentric hypertrophy. Systolic function was vigorous. The    estimated ejection fraction was in the range of 65% to 70%. There   was no dynamic obstruction. Wall motion was normal; there were no   regional wall motion abnormalities. Doppler parameters are   consistent with abnormal left ventricular relaxation (grade 1   diastolic dysfunction). Doppler parameters are consistent with   high ventricular filling pressure. - Mitral valve: Calcified annulus. - Tricuspid valve: There was mild regurgitation.  Impressions:  - No change when compared to prior.   ASSESSMENT AND PLAN:  1. SOB with exertion, improved per pt with lasix, he uses inhaler prescribed by Dr. Lamonte Sakai.  Will increase daily lasix to 80 mg daily and recheck BMP in 2 weeks.  Increase K+ to 30 meq daily.  We discussed tobacco is going to cause SOB and abd fat will also do this as well as deconditioning.  HR has improved with diuresing.  He will keep follow up with Dr. [name] in June. Pt never had cxr that was ordered.  My hope is to decrease the lasix back to 40 in the future.   2. Belching, we had increased prilosec to twice a day - he still is belching.    3. Minimal CAD with last cath  2017  4. Chronic diastolic HF.  5. Tobacco use.  Discussed importance of stopping.  Current medicines are reviewed with the patient today.  The patient Has no concerns regarding medicines.  The following changes have been made:  See above Labs/ tests ordered today include:see above  Disposition:   FU:  see above  Signed, Cecilie Kicks, NP  10/04/2016 8:22 AM    [facility], [place], [place], [place] Phone: [phone]; Fax: [phone]  [phone]

## 2016-10-04 ENCOUNTER — Ambulatory Visit (INDEPENDENT_AMBULATORY_CARE_PROVIDER_SITE_OTHER): Payer: Medicare Other | Admitting: Cardiology

## 2016-10-04 ENCOUNTER — Encounter (INDEPENDENT_AMBULATORY_CARE_PROVIDER_SITE_OTHER): Payer: Self-pay

## 2016-10-04 ENCOUNTER — Encounter: Payer: Self-pay | Admitting: Cardiology

## 2016-10-04 VITALS — BP 118/70 | HR 86 | Ht 72.0 in | Wt 248.8 lb

## 2016-10-04 DIAGNOSIS — I5189 Other ill-defined heart diseases: Secondary | ICD-10-CM

## 2016-10-04 DIAGNOSIS — R0602 Shortness of breath: Secondary | ICD-10-CM | POA: Diagnosis not present

## 2016-10-04 DIAGNOSIS — J449 Chronic obstructive pulmonary disease, unspecified: Secondary | ICD-10-CM

## 2016-10-04 DIAGNOSIS — I519 Heart disease, unspecified: Secondary | ICD-10-CM | POA: Diagnosis not present

## 2016-10-04 DIAGNOSIS — Z720 Tobacco use: Secondary | ICD-10-CM | POA: Diagnosis not present

## 2016-10-04 DIAGNOSIS — I2510 Atherosclerotic heart disease of native coronary artery without angina pectoris: Secondary | ICD-10-CM | POA: Diagnosis not present

## 2016-10-04 DIAGNOSIS — I10 Essential (primary) hypertension: Secondary | ICD-10-CM

## 2016-10-04 MED ORDER — POTASSIUM CHLORIDE CRYS ER 20 MEQ PO TBCR: 30.0000 meq | EXTENDED_RELEASE_TABLET | Freq: Every day | ORAL | 3 refills | Status: DC

## 2016-10-04 MED ORDER — FUROSEMIDE 80 MG PO TABS: 80.0000 mg | ORAL_TABLET | Freq: Every day | ORAL | 9 refills | Status: DC

## 2016-10-04 NOTE — Patient Instructions (Signed)
Medication Instructions:  Your physician has recommended you make the following change in your medication:  1. Increase lasix (80 mg ) daily 2. Increase potassium (30 meq) one and one half tablet daily   Labwork: Your physician recommends that you return for lab work on April 30.   Testing/Procedures: -None  Follow-Up: Keep Follow Up  Any Other Special Instructions Will Be Listed Below (If Applicable).  Steps to Quit Smoking Smoking tobacco can be bad for your health. It can also affect almost every organ in your body. Smoking puts you and people around you at risk for many serious long-lasting (chronic) diseases. Quitting smoking is hard, but it is one of the best things that you can do for your health. It is never too late to quit. What are the benefits of quitting smoking? When you quit smoking, you lower your risk for getting serious diseases and conditions. They can include:  Lung cancer or lung disease.  Heart disease.  Stroke.  Heart attack.  Not being able to have children (infertility).  Weak bones (osteoporosis) and broken bones (fractures). If you have coughing, wheezing, and shortness of breath, those symptoms may get better when you quit. You may also get sick less often. If you are pregnant, quitting smoking can help to lower your chances of having a baby of low birth weight. What can I do to help me quit smoking? Talk with your doctor about what can help you quit smoking. Some things you can do (strategies) include:  Quitting smoking totally, instead of slowly cutting back how much you smoke over a period of time.  Going to in-person counseling. You are more likely to quit if you go to many counseling sessions.  Using resources and support systems, such as:  Online chats with a Social worker.  Phone quitlines.  Printed Furniture conservator/restorer.  Support groups or group counseling.  Text messaging programs.  Mobile phone apps or applications.  Taking  medicines. Some of these medicines may have nicotine in them. If you are pregnant or breastfeeding, do not take any medicines to quit smoking unless your doctor says it is okay. Talk with your doctor about counseling or other things that can help you. Talk with your doctor about using more than one strategy at the same time, such as taking medicines while you are also going to in-person counseling. This can help make quitting easier. What things can I do to make it easier to quit? Quitting smoking might feel very hard at first, but there is a lot that you can do to make it easier. Take these steps:  Talk to your family and friends. Ask them to support and encourage you.  Call phone quitlines, reach out to support groups, or work with a Social worker.  Ask people who smoke to not smoke around you.  Avoid places that make you want (trigger) to smoke, such as:  Bars.  Parties.  Smoke-break areas at work.  Spend time with people who do not smoke.  Lower the stress in your life. Stress can make you want to smoke. Try these things to help your stress:  Getting regular exercise.  Deep-breathing exercises.  Yoga.  Meditating.  Doing a body scan. To do this, close your eyes, focus on one area of your body at a time from head to toe, and notice which parts of your body are tense. Try to relax the muscles in those areas.  Download or buy apps on your mobile phone or tablet that can  help you stick to your quit plan. There are many free apps, such as QuitGuide from the [employer] Office manager for Disease Control and Prevention). You can find more support from smokefree.gov and other websites. This information is not intended to replace advice given to you by your health care provider. Make sure you discuss any questions you have with your health care provider. Document Released: 04/03/2009 Document Revised: 02/03/2016 Document Reviewed: 10/22/2014 Elsevier Interactive Patient Education  2017 [employer].     If you need a refill on your cardiac medications before your next appointment, please call your pharmacy.

## 2016-10-11 ENCOUNTER — Ambulatory Visit (INDEPENDENT_AMBULATORY_CARE_PROVIDER_SITE_OTHER): Payer: Medicare Other | Admitting: Emergency Medicine

## 2016-10-11 ENCOUNTER — Encounter: Payer: Self-pay | Admitting: Emergency Medicine

## 2016-10-11 DIAGNOSIS — I2510 Atherosclerotic heart disease of native coronary artery without angina pectoris: Secondary | ICD-10-CM

## 2016-10-11 DIAGNOSIS — Z720 Tobacco use: Secondary | ICD-10-CM | POA: Diagnosis not present

## 2016-10-11 DIAGNOSIS — J449 Chronic obstructive pulmonary disease, unspecified: Secondary | ICD-10-CM | POA: Diagnosis not present

## 2016-10-11 MED ORDER — ALBUTEROL SULFATE HFA 108 (90 BASE) MCG/ACT IN AERS: 2.0000 | INHALATION_SPRAY | Freq: Four times a day (QID) | RESPIRATORY_TRACT | 6 refills | Status: DC | PRN

## 2016-10-11 MED ORDER — BUDESONIDE-FORMOTEROL FUMARATE 160-4.5 MCG/ACT IN AERO: 2.0000 | INHALATION_SPRAY | Freq: Two times a day (BID) | RESPIRATORY_TRACT | 6 refills | Status: DC

## 2016-10-11 NOTE — Progress Notes (Signed)
Subjective:    Patient ID: Troy [name], male    DOB: 1960-08-25, 56 y.o.   MRN: [id number]  HPI 56 year old current smoker (30+ pack years), hx of hypertension, diastolic dysfunction with diastolic CHF, DM. He has been under eval by Cardiology for exertional SOB, underwent TTE 6/'17 that showed diastolic dysfxn, an intermediate risk Myoview stress test 01/22/16. Prompted L heart cath 01/22/16 > no significant CAD, apical dyskinesis with otherwise hyperdynamic left ventricle, elevated LVEDP. Has been treated w metoprolol, losartan, statin, He has continued to have dyspnea - . Also has noted to Kathleen Argue at [facility] that he snores, has had witnessed apneas. A split night sleep study was planned but he didn't go for it. He is sleepy during the day. He does nap. Has had witnessed apneas. He has cough some days, prod mucous. Has been dx w bronchitis before. Has been on BD's before when he had a flare.   ROV 06/07/16 -- This follow-up visit for patient with history of tobacco abuse, hypertension, diastolic dysfunction, diabetes. He is had a cardiology evaluation as outlined above. A polysomnogram was done on 05/18/16 that did not show any clinically significant obstructive sleep apnea.  He underwent primary function testing today that I personally reviewed. This shows spirometry consistent with mixed obstruction and restriction, restrictive changes on lung volumes but with an elevated residual volume, and mildly decreased diffusion capacity that corrects to the normal range when adjusted for alveolar volume. He tells me that he has been having some SOB w heavier exertion. Has some dry cough. He hasn't smoked in 2 days.   ROV 07/13/16 -- This is a follow-up visit for patient with a history of obesity, tobacco use, hypertension with diastolic CHF, and obstructive lung disease identified on recent pulmonary function testing. 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 I tried starting him on  Spiriva, changed to Incruse for insurance coverage. He has not tried it yet, but did pick it up. He is smoking about 3 cigarettes a day.   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 Alpha-1 genotype heterozygote as above. Level normal. We tried Incruse to see if he would benefit > made him cough so he stopped it. He still smokes about 6-7 cig a day. He believes that his breathing is limited especially w exertion.    Review of Systems  Constitutional: Negative for fever and unexpected weight change.  HENT: Positive for sneezing. Negative for congestion, dental problem, ear pain, nosebleeds, postnasal drip, rhinorrhea, sinus pressure, sore throat and trouble swallowing.   Eyes: Negative for redness and itching.  Respiratory: Positive for cough and shortness of breath. Negative for chest tightness and wheezing.   Cardiovascular: Positive for chest pain and palpitations. Negative for leg swelling.  Gastrointestinal: Negative for nausea and vomiting.  Genitourinary: Negative for dysuria.  Musculoskeletal: Negative for joint swelling.  Skin: Negative for rash.  Neurological: Negative for headaches.  Hematological: Does not bruise/bleed easily.  Psychiatric/Behavioral: Negative for dysphoric mood. The patient is nervous/anxious.    Past Medical History:  Diagnosis Date  . Acute renal failure ([place])    in setting of NSAID use and orthopedic surgery 2010  . Anxiety and depression   . Chronic diastolic CHF (congestive heart failure) ([place])    a. Echo 6/17: severe conc LVH, vigorous EF, EF 65-70%, no dynamic obstruction, no RWMA, Gr 1 DD, mild TR  //  b. LHC 8/17: no sig CAD, LVEDP 28  . DM2 (diabetes mellitus, type 2) ([place])   . Family history of early CAD   . History of amputation of foot ([place])    L trans-met // R toe  . History of cardiac catheterization    a. [place] 2002: irregs  //  b. LHC in 8/17: no  sig CAD, apical DK, hyperdynamic LV, LVEDP 28  . History of nuclear stress test    a. Nuc 7/17: Overall, intermediate risk nuclear stress test secondary to small size of apical lateral defect and reduced ejection fraction.  EF 43%  . HLD (hyperlipidemia)   . HTN (hypertension)   . Injuries     crushing injury to both his feet in February 2010.   Marland Kitchen Kidney calculi   . Tobacco abuse      Family History  Problem Relation Age of Onset  . Leukemia Mother 8    died  . Lung cancer Father 108    died  . Heart attack Brother 57  . Heart attack Brother 36  . Hypertension Brother     X3  . Hypertension Sister     X2     Social History   Social History  . Marital status: Married    Spouse name: N/A  . Number of children: N/A  . Years of education: N/A   Occupational History  . disable    Social History Main Topics  . Smoking status: Current Every Day Smoker    Packs/day: 0.25    Years: 40.00    Types: Cigarettes  . Smokeless tobacco: Never Used  . Alcohol use Yes  . Drug use: No  . Sexual activity: Not on file   Other Topics Concern  . Not on file   Social History Narrative  . No narrative on file     Allergies  Allergen Reactions  . Hydromorphone Hcl Er Anaphylaxis  . Nucynta [Tapentadol Hydrochloride] Other (See Comments)    Throat swells  . Exalamide Other (See Comments)  . Tapentadol Other (See Comments)    Throat swells Angiodema     Outpatient Medications Prior to Visit  Medication Sig Dispense Refill  . amitriptyline (ELAVIL) 150 MG tablet Take 150 mg by mouth at bedtime.      Marland Kitchen amLODipine (NORVASC) 2.5 MG tablet Take 1 tablet (2.5 mg total) by mouth daily. 180 tablet 3  . aspirin EC 81 MG tablet Take 1 tablet (81 mg total) by mouth daily. 90 tablet 3  . dapagliflozin propanediol (FARXIGA) 10 MG TABS tablet Take 10 mg by mouth daily.    . diclofenac sodium (VOLTAREN) 1 % GEL Apply 1 application topically 4 (four) times daily. AS NEEDED FOR PAIN    .  Dulaglutide 0.75 MG/0.5ML SOPN Inject 1 Dose into the skin once a week.     . furosemide (LASIX) 80 MG tablet Take 1 tablet (80 mg total) by mouth daily. 30 tablet 9  . glipiZIDE (GLUCOTROL) 10 MG tablet Take 10 mg by mouth 2 (two) times daily before a meal.    . hydrocortisone 2.5 % cream Apply 1 application topically 2 (two) times daily.    Marland Kitchen levETIRAcetam (KEPPRA) 250 MG tablet TAKE 1 1/2 TABLET BY MOUTH TWICE DAILY    . LORazepam (ATIVAN) 1 MG tablet Take 1 mg by mouth 2 (two) times daily. anxiety    . losartan (COZAAR) 50 MG tablet Take 1 tablet (50 mg total) by mouth daily. 90 tablet  3  . metFORMIN (GLUCOPHAGE-XR) 500 MG 24 hr tablet Take 500 mg by mouth 2 (two) times daily.    . metoprolol succinate (TOPROL-XL) 50 MG 24 hr tablet TAKE 1.5 (75 MG) TABLETS BY MOUTH DAILY. Take with or immediately following a meal. 135 tablet 2  . omeprazole (PRILOSEC) 40 MG capsule Take 40 mg by mouth daily.    Marland Kitchen oxyCODONE-acetaminophen (PERCOCET) 10-325 MG per tablet Take 1 tablet by mouth every 6 (six) hours as needed. pain    . potassium chloride SA (K-DUR,KLOR-CON) 20 MEQ tablet Take 1.5 tablets (30 mEq total) by mouth daily. 45 tablet 3  . simvastatin (ZOCOR) 40 MG tablet Take 40 mg by mouth daily.    . Tiotropium Bromide Monohydrate 2.5 MCG/ACT AERS Inhale 1 puff into the lungs daily.    Marland Kitchen tiZANidine (ZANAFLEX) 4 MG capsule Take 8-12 mg by mouth at bedtime.     . traZODone (DESYREL) 50 MG tablet Take 50 mg by mouth at bedtime.      No facility-administered medications prior to visit.         Objective:   Physical Exam Vitals:   10/11/16 0937  BP: 112/68  Pulse: (!) 119  SpO2: 96%  Weight: 247 lb 6.4 oz (112.2 kg)  Height: 6' (1.829 m)   Gen: Pleasant, overwt man, in no distress,  normal affect  ENT: No lesions,  mouth clear,  Crowded post pharynx, oropharynx clear, no postnasal drip  Neck: No JVD, no TMG, no carotid bruits  Lungs: No use of accessory muscles, clear without rales or  rhonchi  Cardiovascular: RRR, heart sounds normal, no murmur or gallops, no peripheral edema  Musculoskeletal: No deformities, no cyanosis or clubbing  Neuro: alert, non focal  Skin: Warm, no lesions or rashes      Assessment & Plan:  Tobacco abuse discussed cessation today. Will ytry to come up with a quit strategy at our next visit.   COPD (chronic obstructive pulmonary disease) (HCC) Mixed restriction and obstruction. Suspect he will benefit from a BD - he may have benefited from incruse before he had to stop due to cough. Will try Symbicort. If not helpful then will attempt to go back to Spiriva, do  PA with the pharmacy.   Snoring PSG without significant OSA  Baltazar Apo, MD, PhD 10/11/2016, 10:20 AM [facility] [phone] or if no answer [phone]

## 2016-10-11 NOTE — Assessment & Plan Note (Signed)
PSG without significant OSA

## 2016-10-11 NOTE — Assessment & Plan Note (Signed)
Mixed restriction and obstruction. Suspect he will benefit from a BD - he may have benefited from incruse before he had to stop due to cough. Will try Symbicort. If not helpful then will attempt to go back to Spiriva, do  PA with the pharmacy.

## 2016-10-11 NOTE — Assessment & Plan Note (Signed)
discussed cessation today. Will ytry to come up with a quit strategy at our next visit.

## 2016-10-11 NOTE — Patient Instructions (Addendum)
We will try starting Symbicort 2 puffs twice a day. Rinse and gargle after using this.  We will start albuterol 2 puffs up to every 4 hours if needed for shortness of breath.  Follow with Dr Lamonte Sakai in 1 month

## 2016-10-18 ENCOUNTER — Ambulatory Visit: Payer: Medicare Other | Admitting: Registered"

## 2016-10-18 ENCOUNTER — Other Ambulatory Visit: Payer: Medicare Other

## 2016-11-10 ENCOUNTER — Encounter: Payer: Self-pay | Admitting: Adult Health

## 2016-11-10 ENCOUNTER — Ambulatory Visit (INDEPENDENT_AMBULATORY_CARE_PROVIDER_SITE_OTHER): Payer: Medicare Other | Admitting: Adult Health

## 2016-11-10 VITALS — BP 114/70 | HR 101 | Ht 72.0 in | Wt 250.8 lb

## 2016-11-10 DIAGNOSIS — J449 Chronic obstructive pulmonary disease, unspecified: Secondary | ICD-10-CM

## 2016-11-10 DIAGNOSIS — I2510 Atherosclerotic heart disease of native coronary artery without angina pectoris: Secondary | ICD-10-CM

## 2016-11-10 MED ORDER — IPRATROPIUM BROMIDE 0.02 % IN SOLN: 0.5000 mg | Freq: Four times a day (QID) | RESPIRATORY_TRACT | 12 refills | Status: DC

## 2016-11-10 NOTE — Patient Instructions (Addendum)
Begin Atrovent neb Four times a day  .  Work on not smoking .  Follow up Dr. Lamonte Sakai  In 3 months and As needed   Please contact office for sooner follow up if symptoms do not improve or worsen or seek emergency care

## 2016-11-10 NOTE — Progress Notes (Signed)
_0  ID: Troy [name], male    DOB: 27-Jun-1960, 56 y.o.   MRN: [id number]  Chief Complaint  Patient presents with  . 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 Alpha-1 genotype heterozygote with normal level.   11/10/2016 follow-up COPD Patient returns for a one-month follow-up. Patient has known COPD. Has been tried on several inhalers including Spiriva increase and Symbicort. Patient says that his insurance does not cover well and he is unable to afford these medications.[facility] , Spiriva is not on formulary . Incruse is $ 300 , Symbicort $300  ANORO $300  Patient says he has intermittent cough. Denies any wheezing. Get short of breath with activity. Denies any shortness of breath at rest.     Allergies  Allergen Reactions  . Hydromorphone Hcl Er Anaphylaxis  . Nucynta [Tapentadol Hydrochloride] Other (See Comments)    Throat swells  . Exalamide Other (See Comments)  . Tapentadol Other (See Comments)    Throat swells Angiodema    There is no immunization history for the selected administration types on file for this patient.  Past Medical History:  Diagnosis Date  . Acute renal failure ([place])    in setting of NSAID use and orthopedic surgery 2010  . Anxiety and depression   . Chronic diastolic CHF (congestive heart failure) ([place])    a. Echo 6/17: severe conc LVH, vigorous EF, EF 65-70%, no dynamic obstruction, no RWMA, Gr 1 DD, mild TR  //  b. LHC 8/17: no sig CAD, LVEDP 28  . DM2 (diabetes mellitus, type 2) ([place])   . Family history of early CAD   . History of amputation of foot ([place])    L trans-met // R toe  . History of cardiac catheterization    a. Elim 2002: irregs  //  b. LHC in 8/17: no sig CAD, apical DK, hyperdynamic LV, LVEDP 28  . History of nuclear stress test    a. Nuc 7/17: Overall, intermediate risk nuclear stress test  secondary to small size of apical lateral defect and reduced ejection fraction.  EF 43%  . HLD (hyperlipidemia)   . HTN (hypertension)   . Injuries     crushing injury to both his feet in February 2010.   Marland Kitchen Kidney calculi   . Tobacco abuse     Tobacco History: History  Smoking Status  . Current Every Day Smoker  . Packs/day: 0.25  . Years: 40.00  . Types: Cigarettes  Smokeless Tobacco  . Never Used   Ready to quit: No Counseling given: Yes   Outpatient Encounter Prescriptions as of 11/10/2016  Medication Sig  . albuterol (PROVENTIL HFA;VENTOLIN HFA) 108 (90 Base) MCG/ACT inhaler Inhale 2 puffs into the lungs every 6 (six) hours as needed for wheezing or shortness of breath.  Marland Kitchen amitriptyline (ELAVIL) 150 MG tablet Take 150 mg by mouth at bedtime.    Marland Kitchen amLODipine (NORVASC) 2.5 MG tablet Take 1 tablet (2.5 mg total) by mouth daily.  Marland Kitchen aspirin EC 81 MG tablet Take 1 tablet (81 mg total) by mouth daily.  . budesonide-formoterol (SYMBICORT) 160-4.5 MCG/ACT inhaler Inhale 2 puffs into the lungs 2 (two) times daily.  . dapagliflozin propanediol (FARXIGA) 10 MG TABS tablet Take 10 mg by mouth daily.  . diclofenac sodium (VOLTAREN) 1 % GEL Apply 1 application topically 4 (four) times daily. AS NEEDED FOR PAIN  . Dulaglutide  0.75 MG/0.5ML SOPN Inject 1 Dose into the skin once a week.   . Dulaglutide 1.5 MG/0.5ML SOPN Inject into the skin.  . furosemide (LASIX) 80 MG tablet Take 1 tablet (80 mg total) by mouth daily.  Marland Kitchen glipiZIDE (GLUCOTROL) 10 MG tablet Take 10 mg by mouth 2 (two) times daily before a meal.  . hydrocortisone 2.5 % cream Apply 1 application topically 2 (two) times daily.  Marland Kitchen levETIRAcetam (KEPPRA) 250 MG tablet TAKE 1 1/2 TABLET BY MOUTH TWICE DAILY  . LORazepam (ATIVAN) 1 MG tablet Take 1 mg by mouth 2 (two) times daily. anxiety  . losartan (COZAAR) 50 MG tablet Take 1 tablet (50 mg total) by mouth daily.  . metFORMIN (GLUCOPHAGE-XR) 500 MG 24 hr tablet Take 500 mg by  mouth 2 (two) times daily.  . metoprolol succinate (TOPROL-XL) 50 MG 24 hr tablet TAKE 1.5 (75 MG) TABLETS BY MOUTH DAILY. Take with or immediately following a meal.  . omeprazole (PRILOSEC) 40 MG capsule Take 40 mg by mouth daily.  Marland Kitchen oxyCODONE-acetaminophen (PERCOCET) 10-325 MG per tablet Take 1 tablet by mouth every 6 (six) hours as needed. pain  . potassium chloride SA (K-DUR,KLOR-CON) 20 MEQ tablet Take 1.5 tablets (30 mEq total) by mouth daily.  . simvastatin (ZOCOR) 40 MG tablet Take 40 mg by mouth daily.  . Tiotropium Bromide Monohydrate 2.5 MCG/ACT AERS Inhale 1 puff into the lungs daily.  Marland Kitchen tiZANidine (ZANAFLEX) 4 MG capsule Take 8-12 mg by mouth at bedtime.   . traZODone (DESYREL) 50 MG tablet Take 50 mg by mouth at bedtime.   Marland Kitchen ipratropium (ATROVENT) 0.02 % nebulizer solution Take 2.5 mLs (0.5 mg total) by nebulization 4 (four) times daily.   No facility-administered encounter medications on file as of 11/10/2016.      Review of Systems  Constitutional:   No  weight loss, night sweats,  Fevers, chills, + fatigue, or  lassitude.  HEENT:   No headaches,  Difficulty swallowing,  Tooth/dental problems, or  Sore throat,                No sneezing, itching, ear ache, nasal congestion, post nasal drip,   CV:  No chest pain,  Orthopnea, PND, swelling in lower extremities, anasarca, dizziness, palpitations, syncope.   GI  No heartburn, indigestion, abdominal pain, nausea, vomiting, diarrhea, change in bowel habits, loss of appetite, bloody stools.   Resp:  No chest wall deformity  Skin: no rash or lesions.  GU: no dysuria, change in color of urine, no urgency or frequency.  No flank pain, no hematuria   MS:  No joint pain or swelling.  No decreased range of motion.  No back pain.    Physical Exam  BP 114/70 (BP Location: Left Arm, Patient Position: Sitting, Cuff Size: Large)   Pulse (!) 101   Ht 6' (1.829 m)   Wt 250 lb 12.8 oz (113.8 kg)   SpO2 91%   BMI 34.01 kg/m    GEN: A/Ox3; pleasant , NAD, obese    HEENT:  [place]/AT,  EACs-clear, TMs-wnl, NOSE-clear, THROAT-clear, no lesions, no postnasal drip or exudate noted.   NECK:  Supple w/ fair ROM; no JVD; normal carotid impulses w/o bruits; no thyromegaly or nodules palpated; no lymphadenopathy.    RESP  Clear  P & A; w/o, wheezes/ rales/ or rhonchi. no accessory muscle use, no dullness to percussion  CARD:  RRR, no m/r/g, no peripheral edema, pulses intact, no cyanosis or clubbing.  GI:  Soft & nt; nml bowel sounds; no organomegaly or masses detected.   Musco: Warm bil, no deformities or joint swelling noted.   Neuro: alert, no focal deficits noted.    Skin: Warm, no lesions or rashes    Lab Results:   BNP    Component Value Date/Time   BNP 30.9 01/15/2016 0908    ProBNP    Component Value Date/Time   PROBNP 133 09/21/2016 1151    Imaging: No results found.   Assessment & Plan:   COPD (chronic obstructive pulmonary disease) ([place]) COPD - insurance with limited rx coverage for COPD meds .  Will try ATrovent nebs  Look at pt assistance if not working .   Plan  . Patient Instructions  Begin Atrovent neb Four times a day  .  Work on not smoking .  Follow up Dr. Lamonte Sakai  In 3 months and As needed   Please contact office for sooner follow up if symptoms do not improve or worsen or seek emergency care         Rexene Edison, NP 11/10/2016

## 2016-11-10 NOTE — Assessment & Plan Note (Signed)
COPD - insurance with limited rx coverage for COPD meds .  Will try ATrovent nebs  Look at pt assistance if not working .   Plan  . Patient Instructions  Begin Atrovent neb Four times a day  .  Work on not smoking .  Follow up Dr. Lamonte Sakai  In 3 months and As needed   Please contact office for sooner follow up if symptoms do not improve or worsen or seek emergency care

## 2016-11-27 NOTE — Progress Notes (Deleted)
 Cardiology Office Note   Date:  11/27/2016   ID:  Troy [name], DOB 06/04/1961, MRN [id number]  PCP:  Nyland, Leonard, MD  Cardiologist:    , MD       History of Present Illness: Troy [name] is a 55 y.o. male with a history of diastolic CHF, HTN, DM, HL  Cathi in 2002  Irreg  Normal LVEF  Cath in 2017  Minimal CAD   Elevated LEDP   Seen by L Ingold in April   Lasi was increased to 80 mg daily         No outpatient prescriptions have been marked as taking for the 11/29/16 encounter (Appointment) with ,  V, MD.     Allergies:   Hydromorphone hcl er; Nucynta [tapentadol hydrochloride]; Exalamide; and Tapentadol   Past Medical History:  Diagnosis Date  . Acute renal failure ([facility])    in setting of NSAID use and orthopedic surgery 2010  . Anxiety and depression   . Chronic diastolic CHF (congestive heart failure) ([facility])    a. Echo 6/17: severe conc LVH, vigorous EF, EF 65-70%, no dynamic obstruction, no RWMA, Gr 1 DD, mild TR  //  b. LHC 8/17: no sig CAD, LVEDP 28  . DM2 (diabetes mellitus, type 2) ([facility])   . Family history of early CAD   . History of amputation of foot ([facility])    L trans-met // R toe  . History of cardiac catheterization    a. LHC 2002: irregs  //  b. LHC in 8/17: no sig CAD, apical DK, hyperdynamic LV, LVEDP 28  . History of nuclear stress test    a. Nuc 7/17: Overall, intermediate risk nuclear stress test secondary to small size of apical lateral defect and reduced ejection fraction.  EF 43%  . HLD (hyperlipidemia)   . HTN (hypertension)   . Injuries     crushing injury to both his feet in February 2010.   . Kidney calculi   . Tobacco abuse     Past Surgical History:  Procedure Laterality Date  . CARDIAC CATHETERIZATION N/A 01/22/2016   Procedure: Left Heart Cath and Coronary Angiography;  Surgeon: Peter M Jordan, MD;  Location: [facility];  Service: Cardiovascular;  Laterality: N/A;  . FOOT AMPUTATION     bilateral  .  LITHOTRIPSY    . TONSILLECTOMY       Social History:  The patient  reports that he has been smoking Cigarettes.  He has a 10.00 pack-year smoking history. He has never used smokeless tobacco. He reports that he drinks alcohol. He reports that he does not use drugs.   Family History:  The patient's family history includes Heart attack (age of onset: 53) in his brother; Heart attack (age of onset: 62) in his brother; Hypertension in his brother and sister; Leukemia (age of onset: 88) in his mother; Lung cancer (age of onset: 84) in his father.    ROS:  Please see the history of present illness. All other systems are reviewed and  Negative to the above problem except as noted.    PHYSICAL EXAM: VS:  There were no vitals taken for this visit.  GEN: Well nourished, well developed, in no acute distress  HEENT: normal  Neck: no JVD, carotid bruits, or masses Cardiac: RRR; no murmurs, rubs, or gallops,no edema  Respiratory:  clear to auscultation bilaterally, normal work of breathing GI: soft, nontender, nondistended, + BS  No hepatomegaly  MS: no   deformity Moving all extremities   Skin: warm and dry, no rash Neuro:  Strength and sensation are intact Psych: euthymic mood, full affect   EKG:  EKG is ordered today.   Lipid Panel    Component Value Date/Time   CHOL 124 08/27/2016 0845   TRIG 168 (H) 08/27/2016 0845   HDL 33 (L) 08/27/2016 0845   CHOLHDL 3.8 08/27/2016 0845   LDLCALC 57 08/27/2016 0845      Wt Readings from Last 3 Encounters:  11/10/16 113.8 kg (250 lb 12.8 oz)  10/11/16 112.2 kg (247 lb 6.4 oz)  10/04/16 112.9 kg (248 lb 12.8 oz)      ASSESSMENT AND PLAN: 1  Acute on chronic diastolic CHF  2  HTN  3  miniaml CAD  4  Tob abuse     Current medicines are reviewed at length with the patient today.  The patient does not have concerns regarding medicines.  Signed,  , MD  11/27/2016 10:58 PM    [facility] [address]  [place], [place],   [address] Phone: [phone]; Fax: [phone]    

## 2016-11-29 ENCOUNTER — Ambulatory Visit: Payer: Medicare Other | Admitting: Internal Medicine

## 2017-02-09 NOTE — Progress Notes (Signed)
 Cardiology Office Note   Date:  02/10/2017   ID:  Troy [name], DOB 12/17/1960, MRN [id number]  PCP:  Nyland, Leonard, MD  Cardiologist:    , MD   F/U of diastolic CHF      History of Present Illness: Troy [name] is a 56 y.o. male with a history ofdiastolic CHF, DM, HL HTN.  Cath in 2002 normal LVEF  Luminal irreg .  Echo showed normal LVEF and mild diastolic dysfuncton  [place] with possible apical ischemia  LHC with no signif CAD   There was a apical DK noted  LVEDP 28   I saw the pt in March 2018 When I saw the pt I recomm increasing Toprol to 75 and cut back on amlodipine   Patient seen by L Ingold in APril  SOB  Lasix increased transiently   Seen again on 4/16  Lasix increased to 80 daily    Breathing about the same from earlier this spring   Wt is down A lot of belching  Bowels moving OK     Current Meds  Medication Sig  . albuterol (PROVENTIL HFA;VENTOLIN HFA) 108 (90 Base) MCG/ACT inhaler Inhale 2 puffs into the lungs every 6 (six) hours as needed for wheezing or shortness of breath.  . amitriptyline (ELAVIL) 150 MG tablet Take 150 mg by mouth at bedtime.    . amLODipine (NORVASC) 2.5 MG tablet Take 1 tablet (2.5 mg total) by mouth daily.  . aspirin EC 81 MG tablet Take 1 tablet (81 mg total) by mouth daily.  . dapagliflozin propanediol (FARXIGA) 10 MG TABS tablet Take 10 mg by mouth daily.  . Dulaglutide 0.75 MG/0.5ML SOPN Inject 1 Dose into the skin once a week.   . Dulaglutide 1.5 MG/0.5ML SOPN Inject into the skin.  . furosemide (LASIX) 80 MG tablet Take 1 tablet (80 mg total) by mouth daily.  . glipiZIDE (GLUCOTROL) 10 MG tablet Take 10 mg by mouth 2 (two) times daily before a meal.  . hydrocortisone 2.5 % cream Apply 1 application topically 2 (two) times daily.  . ipratropium (ATROVENT) 0.02 % nebulizer solution Take 2.5 mLs (0.5 mg total) by nebulization 4 (four) times daily.  . LORazepam (ATIVAN) 1 MG tablet Take 1 mg by mouth 2 (two) times daily.  anxiety  . losartan (COZAAR) 50 MG tablet Take 1 tablet (50 mg total) by mouth daily.  . metFORMIN (GLUCOPHAGE-XR) 500 MG 24 hr tablet Take 500 mg by mouth 2 (two) times daily.  . metoprolol succinate (TOPROL-XL) 50 MG 24 hr tablet TAKE 1.5 (75 MG) TABLETS BY MOUTH DAILY. Take with or immediately following a meal.  . omeprazole (PRILOSEC) 40 MG capsule Take 40 mg by mouth daily.  . oxyCODONE-acetaminophen (PERCOCET) 10-325 MG per tablet Take 1 tablet by mouth every 6 (six) hours as needed. pain  . potassium chloride SA (K-DUR,KLOR-CON) 20 MEQ tablet Take 1.5 tablets (30 mEq total) by mouth daily.  . simvastatin (ZOCOR) 40 MG tablet Take 40 mg by mouth daily.  . Tiotropium Bromide Monohydrate 2.5 MCG/ACT AERS Inhale 1 puff into the lungs daily.  . tiZANidine (ZANAFLEX) 4 MG capsule Take 8-12 mg by mouth at bedtime.   . traZODone (DESYREL) 50 MG tablet Take 50 mg by mouth at bedtime.      Allergies:   Hydromorphone hcl er; Nucynta [tapentadol hydrochloride]; Exalamide; and Tapentadol   Past Medical History:  Diagnosis Date  . Acute renal failure ([facility])    in setting of   NSAID use and orthopedic surgery 2010  . Anxiety and depression   . Chronic diastolic CHF (congestive heart failure) ([facility])    a. Echo 6/17: severe conc LVH, vigorous EF, EF 65-70%, no dynamic obstruction, no RWMA, Gr 1 DD, mild TR  //  b. LHC 8/17: no sig CAD, LVEDP 28  . DM2 (diabetes mellitus, type 2) ([facility])   . Family history of early CAD   . History of amputation of foot ([facility])    L trans-met // R toe  . History of cardiac catheterization    a. LHC 2002: irregs  //  b. LHC in 8/17: no sig CAD, apical DK, hyperdynamic LV, LVEDP 28  . History of nuclear stress test    a. Nuc 7/17: Overall, intermediate risk nuclear stress test secondary to small size of apical lateral defect and reduced ejection fraction.  EF 43%  . HLD (hyperlipidemia)   . HTN (hypertension)   . Injuries     crushing injury to both his feet in February  2010.   . Kidney calculi   . Tobacco abuse     Past Surgical History:  Procedure Laterality Date  . CARDIAC CATHETERIZATION N/A 01/22/2016   Procedure: Left Heart Cath and Coronary Angiography;  Surgeon: Peter M Jordan, MD;  Location: [facility];  Service: Cardiovascular;  Laterality: N/A;  . FOOT AMPUTATION     bilateral  . LITHOTRIPSY    . TONSILLECTOMY       Social History:  The patient  reports that he has been smoking Cigarettes.  He has a 10.00 pack-year smoking history. He has never used smokeless tobacco. He reports that he drinks alcohol. He reports that he does not use drugs.   Family History:  The patient's family history includes Heart attack (age of onset: 53) in his brother; Heart attack (age of onset: 62) in his brother; Hypertension in his brother and sister; Leukemia (age of onset: 88) in his mother; Lung cancer (age of onset: 84) in his father.    ROS:  Please see the history of present illness. All other systems are reviewed and  Negative to the above problem except as noted.    PHYSICAL EXAM: VS:  BP 128/76   Pulse 97   Ht 6' (1.829 m)   Wt 247 lb (112 kg)   SpO2 96%   BMI 33.50 kg/m   GEN: Obese 56 yo, in no acute distress  HEENT: normal  Neck: no JVD, carotid bruits, or masses Cardiac: RRR; no murmurs, rubs, or gallops,tr edema  Respiratory:  clear to auscultation bilaterally, normal work of breathing GI: soft, nontender, nondistended, + BS  No hepatomegaly  MS: no deformity Moving all extremities   Skin: warm and dry, no rash Neuro:  Strength and sensation are intact Psych: euthymic mood, full affect   EKG:  EKG is not ordered today.   Lipid Panel    Component Value Date/Time   CHOL 124 08/27/2016 0845   TRIG 168 (H) 08/27/2016 0845   HDL 33 (L) 08/27/2016 0845   CHOLHDL 3.8 08/27/2016 0845   LDLCALC 57 08/27/2016 0845      Wt Readings from Last 3 Encounters:  02/10/17 247 lb (112 kg)  11/10/16 250 lb 12.8 oz (113.8 kg)    10/11/16 247 lb 6.4 oz (112.2 kg)      ASSESSMENT AND PLAN:  1  Diastolic CHF  Cath last year showed vigorous LV with apical dyskinesis  LVEDP was severely elevated at   49  No signif CAD   Echo with severe LVH  Wt is down on his home scales  WIll check labs  I would also recomm increasing toprol XL to 100  mg to control HR better   Keep on amlodipine for now   HOlter monitor to assess HR  2  HTN  BP is OK  3  Belching  Will refer to GI    4  Pulm  F/U with R Byrum  Scheduled   F/U in October      Current medicines are reviewed at length with the patient today.  The patient does not have concerns regarding medicines.  Signed, [name], MD  02/10/2017 10:18 AM    [address], [place], [place]  [address] Phone: [phone]; Fax: [phone]

## 2017-02-10 ENCOUNTER — Encounter: Payer: Self-pay | Admitting: Internal Medicine

## 2017-02-10 ENCOUNTER — Encounter: Payer: Self-pay | Admitting: Gastroenterology

## 2017-02-10 ENCOUNTER — Ambulatory Visit (INDEPENDENT_AMBULATORY_CARE_PROVIDER_SITE_OTHER): Payer: Medicare Other | Admitting: Internal Medicine

## 2017-02-10 VITALS — BP 128/76 | HR 97 | Ht 72.0 in | Wt 247.0 lb

## 2017-02-10 DIAGNOSIS — I5032 Chronic diastolic (congestive) heart failure: Secondary | ICD-10-CM | POA: Diagnosis not present

## 2017-02-10 DIAGNOSIS — I2510 Atherosclerotic heart disease of native coronary artery without angina pectoris: Secondary | ICD-10-CM | POA: Diagnosis not present

## 2017-02-10 DIAGNOSIS — R142 Eructation: Secondary | ICD-10-CM | POA: Diagnosis not present

## 2017-02-10 DIAGNOSIS — I10 Essential (primary) hypertension: Secondary | ICD-10-CM

## 2017-02-10 DIAGNOSIS — R0602 Shortness of breath: Secondary | ICD-10-CM | POA: Diagnosis not present

## 2017-02-10 LAB — PRO B NATRIURETIC PEPTIDE: NT-PRO BNP: 151 pg/mL (ref 0–210)

## 2017-02-10 LAB — BASIC METABOLIC PANEL
BUN / CREAT RATIO: 12 (ref 9–20)
BUN: 8 mg/dL (ref 6–24)
CO2: 23 mmol/L (ref 20–29)
CREATININE: 0.66 mg/dL — AB (ref 0.76–1.27)
Calcium: 9 mg/dL (ref 8.7–10.2)
Chloride: 96 mmol/L (ref 96–106)
GFR calc non Af Amer: 108 mL/min/{1.73_m2} (ref >59)
GFR, EST AFRICAN AMERICAN: 125 mL/min/{1.73_m2} (ref >59)
GLUCOSE: 126 mg/dL — AB (ref 65–99)
Potassium: 3.6 mmol/L (ref 3.5–5.2)
SODIUM: 137 mmol/L (ref 134–144)

## 2017-02-10 MED ORDER — AMLODIPINE BESYLATE 2.5 MG PO TABS
2.5000 mg | ORAL_TABLET | Freq: Every day | ORAL | 3 refills | Status: DC
Start: 2017-02-10 — End: 2017-03-24

## 2017-02-10 MED ORDER — METOPROLOL SUCCINATE ER 100 MG PO TB24: 100.0000 mg | ORAL_TABLET | Freq: Every day | ORAL | 3 refills | Status: DC

## 2017-02-10 NOTE — Patient Instructions (Signed)
Your physician has recommended you make the following change in your medication:  1.) STAY ON AMLODIPINE 2.) INCREASE METOPROLOL SUCCINATE TO 100 MG DAILY  Your physician has recommended that you wear a holter monitor. Holter monitors are medical devices that record the heart's electrical activity. Doctors most often use these monitors to diagnose arrhythmias. Arrhythmias are problems with the speed or rhythm of the heartbeat. The monitor is a small, portable device. You can wear one while you do your normal daily activities. This is usually used to diagnose what is causing palpitations/syncope (passing out).  PLEASE SCHEDULE MONITOR FOR IN ABOUT 7-10 DAYS  Your physician recommends that you return for lab work TODAY (bmet, bnp)  Your physician recommends that you schedule a follow-up appointment in: October, 2018 with Dr. [name].

## 2017-02-17 ENCOUNTER — Encounter: Payer: Self-pay | Admitting: Emergency Medicine

## 2017-02-17 ENCOUNTER — Other Ambulatory Visit: Payer: Medicare Other

## 2017-02-17 ENCOUNTER — Other Ambulatory Visit: Payer: Self-pay | Admitting: Internal Medicine

## 2017-02-17 ENCOUNTER — Ambulatory Visit (INDEPENDENT_AMBULATORY_CARE_PROVIDER_SITE_OTHER): Payer: Medicare Other

## 2017-02-17 ENCOUNTER — Ambulatory Visit (INDEPENDENT_AMBULATORY_CARE_PROVIDER_SITE_OTHER): Payer: Medicare Other | Admitting: Emergency Medicine

## 2017-02-17 DIAGNOSIS — I2510 Atherosclerotic heart disease of native coronary artery without angina pectoris: Secondary | ICD-10-CM | POA: Diagnosis not present

## 2017-02-17 DIAGNOSIS — R0602 Shortness of breath: Secondary | ICD-10-CM

## 2017-02-17 DIAGNOSIS — J449 Chronic obstructive pulmonary disease, unspecified: Secondary | ICD-10-CM

## 2017-02-17 DIAGNOSIS — F17210 Nicotine dependence, cigarettes, uncomplicated: Secondary | ICD-10-CM | POA: Diagnosis not present

## 2017-02-17 DIAGNOSIS — R000 Tachycardia, unspecified: Secondary | ICD-10-CM

## 2017-02-17 DIAGNOSIS — I10 Essential (primary) hypertension: Secondary | ICD-10-CM

## 2017-02-17 DIAGNOSIS — Z720 Tobacco use: Secondary | ICD-10-CM

## 2017-02-17 NOTE — Progress Notes (Signed)
Subjective:    Patient ID: Troy [name], male    DOB: 11/26/1960, 56 y.o.   MRN: [id number]  HPI 56 year old current smoker (30+ pack years), hx of hypertension, diastolic dysfunction with diastolic CHF, DM. He has been under eval by Cardiology for exertional SOB, underwent TTE 6/'17 that showed diastolic dysfxn, an intermediate risk Myoview stress test 01/22/16. Prompted L heart cath 01/22/16 > no significant CAD, apical dyskinesis with otherwise hyperdynamic left ventricle, elevated LVEDP. Has been treated w metoprolol, losartan, statin, He has continued to have dyspnea - . Also has noted to Kathleen Argue at [facility] that he snores, has had witnessed apneas. A split night sleep study was planned but he didn't go for it. He is sleepy during the day. He does nap. Has had witnessed apneas. He has cough some days, prod mucous. Has been dx w bronchitis before. Has been on BD's before when he had a flare.   ROV 06/07/16 -- This follow-up visit for patient with history of tobacco abuse, hypertension, diastolic dysfunction, diabetes. He is had a cardiology evaluation as outlined above. A polysomnogram was done on 05/18/16 that did not show any clinically significant obstructive sleep apnea.  He underwent primary function testing today that I personally reviewed. This shows spirometry consistent with mixed obstruction and restriction, restrictive changes on lung volumes but with an elevated residual volume, and mildly decreased diffusion capacity that corrects to the normal range when adjusted for alveolar volume. He tells me that he has been having some SOB w heavier exertion. Has some dry cough. He hasn't smoked in 2 days.   ROV 07/13/16 -- This is a follow-up visit for patient with a history of obesity, tobacco use, hypertension with diastolic CHF, and obstructive lung disease identified on recent pulmonary function testing. 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 I tried starting him on  Spiriva, changed to Incruse for insurance coverage. He has not tried it yet, but did pick it up. He is smoking about 3 cigarettes a day.   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 Alpha-1 genotype heterozygote as above. Level normal. We tried Incruse to see if he would benefit > made him cough so he stopped it. He still smokes about 6-7 cig a day. He believes that his breathing is limited especially w exertion.   ROV 02/17/17 -- follow-up visit for patient with a history of active tobacco use, obesity, mixed restrictive and obstructive lung disease. He isn't alpha-1 heterozygote with normal levels. I have tried him on multiple long-acting bronchodilators. Cost is often been the barrier. He's currently on atrovent, only taking once a day. He is a couch potato. Stops to rest w walking. He has some cough, minimal mucous. No wheezing.  He is smoking 1/2 pk a day.    Review of Systems  Constitutional: Negative for fever and unexpected weight change.  HENT: Positive for sneezing. Negative for congestion, dental problem, ear pain, nosebleeds, postnasal drip, rhinorrhea, sinus pressure, sore throat and trouble swallowing.   Eyes: Negative for redness and itching.  Respiratory: Positive for cough and shortness of breath. Negative for chest tightness and wheezing.   Cardiovascular: Positive for chest pain and palpitations. Negative for leg swelling.  Gastrointestinal: Negative for nausea and vomiting.  Genitourinary: Negative for dysuria.  Musculoskeletal: Negative for joint swelling.  Skin: Negative for rash.  Neurological: Negative for headaches.  Hematological:  Does not bruise/bleed easily.  Psychiatric/Behavioral: Negative for dysphoric mood. The patient is nervous/anxious.    Past Medical History:  Diagnosis Date  . Acute renal failure ([place])    in setting of NSAID use and orthopedic surgery 2010  .  Anxiety and depression   . Chronic diastolic CHF (congestive heart failure) ([place])    a. Echo 6/17: severe conc LVH, vigorous EF, EF 65-70%, no dynamic obstruction, no RWMA, Gr 1 DD, mild TR  //  b. LHC 8/17: no sig CAD, LVEDP 28  . DM2 (diabetes mellitus, type 2) ([place])   . Family history of early CAD   . 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 History of cardiac catheterization    a. [place] 2002: irregs  //  b. LHC in 8/17: no sig CAD, apical DK, hyperdynamic LV, LVEDP 28  . History of nuclear stress test    a. Nuc 7/17: Overall, intermediate risk nuclear stress test secondary to small size of apical lateral defect and reduced ejection fraction.  EF 43%  . HLD (hyperlipidemia)   . HTN (hypertension)   . Injuries     crushing injury to both his feet in February 2010.   Marland Kitchen Kidney calculi   . Tobacco abuse      Family History  Problem Relation Age of Onset  . Leukemia Mother 47       died  . Lung cancer Father 78       died  . Heart attack Brother 22  . Heart attack Brother 68  . Hypertension Brother        X3  . Hypertension Sister        X2     Social History   Social History  . Marital status: Married    Spouse name: N/A  . Number of children: N/A  . Years of education: N/A   Occupational History  . disable    Social History Main Topics  . Smoking status: Current Every Day Smoker    Packs/day: 0.50    Years: 40.00    Types: Cigarettes  . Smokeless tobacco: Never Used  . Alcohol use Yes  . Drug use: No  . Sexual activity: Not on file   Other Topics Concern  . Not on file   Social History Narrative  . No narrative on file     Allergies  Allergen Reactions  . Hydromorphone Hcl Er Anaphylaxis  . Nucynta [Tapentadol Hydrochloride] Other (See Comments)    Throat swells  . Exalamide Other (See Comments)  . Tapentadol Other (See Comments)    Throat swells Angiodema     Outpatient Medications Prior to Visit  Medication Sig Dispense  Refill  . albuterol (PROVENTIL HFA;VENTOLIN HFA) 108 (90 Base) MCG/ACT inhaler Inhale 2 puffs into the lungs every 6 (six) hours as needed for wheezing or shortness of breath. 1 Inhaler 6  . amitriptyline (ELAVIL) 150 MG tablet Take 150 mg by mouth at bedtime.      Marland Kitchen amLODipine (NORVASC) 2.5 MG tablet Take 1 tablet (2.5 mg total) by mouth daily. 90 tablet 3  . aspirin EC 81 MG tablet Take 1 tablet (81 mg total) by mouth daily. 90 tablet 3  . dapagliflozin propanediol (FARXIGA) 10 MG TABS tablet Take 10 mg by mouth daily.    . Dulaglutide 1.5 MG/0.5ML SOPN Inject into the skin.    . furosemide (LASIX) 80 MG tablet Take 1 tablet (80 mg  total) by mouth daily. 30 tablet 9  . glipiZIDE (GLUCOTROL) 10 MG tablet Take 10 mg by mouth 2 (two) times daily before a meal.    . hydrocortisone 2.5 % cream Apply 1 application topically 2 (two) times daily.    Marland Kitchen ipratropium (ATROVENT) 0.02 % nebulizer solution Take 2.5 mLs (0.5 mg total) by nebulization 4 (four) times daily. 75 mL 12  . LORazepam (ATIVAN) 1 MG tablet Take 1 mg by mouth 2 (two) times daily. anxiety    . losartan (COZAAR) 50 MG tablet Take 1 tablet (50 mg total) by mouth daily. 90 tablet 3  . metFORMIN (GLUCOPHAGE-XR) 500 MG 24 hr tablet Take 500 mg by mouth 2 (two) times daily.    . metoprolol succinate (TOPROL-XL) 100 MG 24 hr tablet Take 1 tablet (100 mg total) by mouth daily. Take with or immediately following a meal. 90 tablet 3  . omeprazole (PRILOSEC) 40 MG capsule Take 40 mg by mouth daily.    Marland Kitchen oxyCODONE-acetaminophen (PERCOCET) 10-325 MG per tablet Take 1 tablet by mouth every 6 (six) hours as needed. pain    . potassium chloride SA (K-DUR,KLOR-CON) 20 MEQ tablet Take 1.5 tablets (30 mEq total) by mouth daily. 45 tablet 3  . simvastatin (ZOCOR) 40 MG tablet Take 40 mg by mouth daily.    . Tiotropium Bromide Monohydrate 2.5 MCG/ACT AERS Inhale 1 puff into the lungs daily.    Marland Kitchen tiZANidine (ZANAFLEX) 4 MG capsule Take 8-12 mg by mouth at  bedtime.     . traZODone (DESYREL) 50 MG tablet Take 50 mg by mouth at bedtime.     . Dulaglutide 0.75 MG/0.5ML SOPN Inject 1 Dose into the skin once a week.      No facility-administered medications prior to visit.         Objective:   Physical Exam Vitals:   02/17/17 0947  BP: 128/80  Pulse: 76  SpO2: 97%  Weight: 246 lb (111.6 kg)  Height: 6' (1.829 m)   Gen: Pleasant, overwt man, in no distress,  normal affect  ENT: No lesions,  mouth clear,  Crowded post pharynx, oropharynx clear, no postnasal drip  Neck: No JVD, no TMG, no carotid bruits  Lungs: No use of accessory muscles, clear without rales or rhonchi  Cardiovascular: RRR, heart sounds normal, no murmur or gallops, no peripheral edema  Musculoskeletal: No deformities, no cyanosis or clubbing  Neuro: alert, non focal  Skin: Warm, no lesions or rashes      Assessment & Plan:  COPD (chronic obstructive pulmonary disease) (HCC) Alpha-1 antitrypsin genotype MS, level in 2017 was normal. Not currently on any reliable inhaler regimen. I have asked him to restart Atrovent 3 times a day for now. If his prescription drug coverage improve then we will change to a long-acting bronchodilator. Check alpha-1 antitrypsin level today.  Tobacco abuse Discussed  cessation. Set a goal of cutting down to 5 cigarettes daily.  Baltazar Apo, MD, PhD 02/17/2017, 2:14 PM [facility] [phone] or if no answer [phone]

## 2017-02-17 NOTE — Assessment & Plan Note (Addendum)
Discussed  cessation. Set a goal of cutting down to 5 cigarettes daily.

## 2017-02-17 NOTE — Patient Instructions (Signed)
Please resume taking your Atrovent nebulizer 3 times a day on a schedule Cut cigarettes down to 5 cigarettes daily. Once you have accomplished this we will work on setting a quit date We will check alpha-1 antitrypsin level today Once your prescription drug coverage changes we will consider starting a long-acting inhaler.  Follow with Dr Lamonte Sakai in 6 months or sooner if you have any problems

## 2017-02-17 NOTE — Assessment & Plan Note (Signed)
Alpha-1 antitrypsin genotype MS, level in 2017 was normal. Not currently on any reliable inhaler regimen. I have asked him to restart Atrovent 3 times a day for now. If his prescription drug coverage improve then we will change to a long-acting bronchodilator. Check alpha-1 antitrypsin level today.

## 2017-02-22 ENCOUNTER — Telehealth: Payer: Self-pay | Admitting: Internal Medicine

## 2017-02-22 LAB — ALPHA-1-ANTITRYPSIN: A-1 Antitrypsin, Ser: 176 mg/dL (ref 83–199)

## 2017-02-22 NOTE — Telephone Encounter (Signed)
Walk in pt Form-please call wife about lab work. Placed in [place] doc box

## 2017-03-16 ENCOUNTER — Other Ambulatory Visit: Payer: Self-pay | Admitting: Physician Assistant

## 2017-03-16 DIAGNOSIS — I10 Essential (primary) hypertension: Secondary | ICD-10-CM

## 2017-03-23 NOTE — Progress Notes (Signed)
Cardiology Office Note   Date:  03/24/2017   ID:  Troy [name], DOB 1960-12-28, MRN [id number]  PCP:  [name], MD  Cardiologist:   [name], MD   F/U of diastolic CHF      History of Present Illness: Troy [name] is a 56 y.o. male with a history of diastolic CHF, DM, HL HTN.  Cath in 2002 normal LVEF  Luminal irreg .  Echo showed normal LVEF and mild diastolic dysfuncton  [place] with possible apical ischemia  LHC with no signif CAD   There was a apical DK noted  LVEDP 28   I saw the pt in Aug 2018    I recomm increasing Toprol XL to 100  Continue amlodipie    Pt still gets SOB with activity  Wife notes pulse at home was 140s at some points    Denies dizziness  No CP   Current Meds  Medication Sig  . albuterol (PROVENTIL HFA;VENTOLIN HFA) 108 (90 Base) MCG/ACT inhaler Inhale 2 puffs into the lungs every 6 (six) hours as needed for wheezing or shortness of breath.  Marland Kitchen amitriptyline (ELAVIL) 150 MG tablet Take 150 mg by mouth at bedtime.    Marland Kitchen amLODipine (NORVASC) 2.5 MG tablet Take 1 tablet (2.5 mg total) by mouth daily.  Marland Kitchen aspirin EC 81 MG tablet Take 1 tablet (81 mg total) by mouth daily.  . dapagliflozin propanediol (FARXIGA) 10 MG TABS tablet Take 10 mg by mouth daily.  . Dulaglutide 1.5 MG/0.5ML SOPN Inject into the skin.  . furosemide (LASIX) 80 MG tablet Take 1 tablet (80 mg total) by mouth daily.  Marland Kitchen glipiZIDE (GLUCOTROL) 10 MG tablet Take 10 mg by mouth 2 (two) times daily before a meal.  . hydrocortisone 2.5 % cream Apply 1 application topically 2 (two) times daily.  Marland Kitchen ipratropium (ATROVENT) 0.02 % nebulizer solution Take 2.5 mLs (0.5 mg total) by nebulization 4 (four) times daily.  Marland Kitchen LORazepam (ATIVAN) 1 MG tablet Take 1 mg by mouth 2 (two) times daily. anxiety  . losartan (COZAAR) 50 MG tablet TAKE 1 TABLET (50 MG TOTAL) BY MOUTH DAILY.  . metFORMIN (GLUCOPHAGE-XR) 500 MG 24 hr tablet Take 500 mg by mouth 2 (two) times daily.  . metoprolol succinate (TOPROL-XL)  100 MG 24 hr tablet Take 1 tablet (100 mg total) by mouth daily. Take with or immediately following a meal.  . omeprazole (PRILOSEC) 40 MG capsule Take 40 mg by mouth daily.  Marland Kitchen oxyCODONE-acetaminophen (PERCOCET) 10-325 MG per tablet Take 1 tablet by mouth every 6 (six) hours as needed. pain  . potassium chloride SA (K-DUR,KLOR-CON) 20 MEQ tablet Take 1.5 tablets (30 mEq total) by mouth daily.  . simvastatin (ZOCOR) 40 MG tablet Take 40 mg by mouth daily.  . Tiotropium Bromide Monohydrate 2.5 MCG/ACT AERS Inhale 1 puff into the lungs daily.  Marland Kitchen tiZANidine (ZANAFLEX) 4 MG capsule Take 8-12 mg by mouth at bedtime.   . traZODone (DESYREL) 50 MG tablet Take 50 mg by mouth at bedtime.      Allergies:   Hydromorphone hcl er; Nucynta [tapentadol hydrochloride]; Exalamide; and Tapentadol   Past Medical History:  Diagnosis Date  . Acute renal failure ([place])    in setting of NSAID use and orthopedic surgery 2010  . Anxiety and depression   . Chronic diastolic CHF (congestive heart failure) ([place])    a. Echo 6/17: severe conc LVH, vigorous EF, EF 65-70%, no dynamic obstruction, no RWMA, Gr 1 DD,  mild TR  //  b. LHC 8/17: no sig CAD, LVEDP 28  . DM2 (diabetes mellitus, type 2) ([place])   . Family history of early CAD   . 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 History of cardiac catheterization    a. [place] 2002: irregs  //  b. LHC in 8/17: no sig CAD, apical DK, hyperdynamic LV, LVEDP 28  . History of nuclear stress test    a. Nuc 7/17: Overall, intermediate risk nuclear stress test secondary to small size of apical lateral defect and reduced ejection fraction.  EF 43%  . HLD (hyperlipidemia)   . HTN (hypertension)   . Injuries     crushing injury to both his feet in February 2010.   Marland Kitchen Kidney calculi   . Tobacco abuse     Past Surgical History:  Procedure Laterality Date  . CARDIAC CATHETERIZATION N/A 01/22/2016   Procedure: Left Heart Cath and Coronary Angiography;  Surgeon:  Peter M Martinique, MD;  Location: [facility];  Service: Cardiovascular;  Laterality: N/A;  . FOOT AMPUTATION     bilateral  . LITHOTRIPSY    . TONSILLECTOMY       Social History:  The patient  reports that he has been smoking Cigarettes.  He has a 20.00 pack-year smoking history. He has never used smokeless tobacco. He reports that he drinks alcohol. He reports that he does not use drugs.   Family History:  The patient's family history includes Heart attack (age of onset: 27) in his brother; Heart attack (age of onset: 59) in his brother; Hypertension in his brother and sister; Leukemia (age of onset: 90) in his mother; Lung cancer (age of onset: 39) in his father.    ROS:  Please see the history of present illness. All other systems are reviewed and  Negative to the above problem except as noted.    PHYSICAL EXAM: VS:  BP 112/60   Pulse (!) 110   Ht 6' (1.829 m)   Wt 241 lb 9.6 oz (109.6 kg)   SpO2 98%   BMI 32.77 kg/m   Walking in halls  Sats 98%  Pulse 100 to 116    GEN: Obese 56 yo, in no acute distress  HEENT: normal   Neck: no JVD, carotid bruits, or masses Cardiac: RRR; II/VI systollic murmur LSB to base  , rubs, or gallops,tr edema  Respiratory:  clear to auscultation bilaterally, normal work of breathing GI: soft, nontender, nondistended, + BS  No hepatomegaly  MS: no deformity Moving all extremities   Skin: warm and dry, no rash Neuro:  Strength and sensation are intact Psych: euthymic mood, full affect   EKG:  EKG is not ordered today.   Lipid Panel    Component Value Date/Time   CHOL 124 08/27/2016 0845   TRIG 168 (H) 08/27/2016 0845   HDL 33 (L) 08/27/2016 0845   CHOLHDL 3.8 08/27/2016 0845   LDLCALC 57 08/27/2016 0845      Wt Readings from Last 3 Encounters:  03/24/17 241 lb 9.6 oz (109.6 kg)  02/17/17 246 lb (111.6 kg)  02/10/17 247 lb (112 kg)      ASSESSMENT AND PLAN:  1  Diastolic CHF  Cath last year showed vigorous LV with apical  dyskinesis  LVEDP was severely elevated at 38  No signif CAD   Echo with severe LVH   Today he is tachycardic  BP does go  down a little with standing (118-110)  Pulse 98 to 111.   I would increased metoprolol to 100 am and 50 pm  Get CBC, BMET TSH and UA today  Encouraged fluids   F/U in 1 month May need repeat R heart cath   2  HTN  Adjust meds as noted above   3   Pulm Followed by R Byrum  Alpha 1 antitrypsin genotype MS  Level nrmal  Statedo n atrovent  Current medicines are reviewed at length with the patient today.  The patient does not have concerns regarding medicines.  Signed, [name], MD  03/24/2017 10:20 AM    [facility] [address], [place], [place]  [address] Phone: [phone]; Fax: [phone]

## 2017-03-24 ENCOUNTER — Encounter: Payer: Self-pay | Admitting: Internal Medicine

## 2017-03-24 ENCOUNTER — Ambulatory Visit (INDEPENDENT_AMBULATORY_CARE_PROVIDER_SITE_OTHER): Payer: Medicare Other | Admitting: Internal Medicine

## 2017-03-24 VITALS — BP 112/60 | HR 110 | Ht 72.0 in | Wt 241.6 lb

## 2017-03-24 DIAGNOSIS — R0600 Dyspnea, unspecified: Secondary | ICD-10-CM | POA: Diagnosis not present

## 2017-03-24 DIAGNOSIS — R000 Tachycardia, unspecified: Secondary | ICD-10-CM | POA: Diagnosis not present

## 2017-03-24 DIAGNOSIS — I2510 Atherosclerotic heart disease of native coronary artery without angina pectoris: Secondary | ICD-10-CM | POA: Diagnosis not present

## 2017-03-24 DIAGNOSIS — I10 Essential (primary) hypertension: Secondary | ICD-10-CM

## 2017-03-24 LAB — BASIC METABOLIC PANEL
BUN / CREAT RATIO: 15 (ref 9–20)
BUN: 12 mg/dL (ref 6–24)
CO2: 22 mmol/L (ref 20–29)
CREATININE: 0.79 mg/dL (ref 0.76–1.27)
Calcium: 9.2 mg/dL (ref 8.7–10.2)
Chloride: 98 mmol/L (ref 96–106)
GFR calc Af Amer: 115 mL/min/{1.73_m2} (ref >59)
GFR, EST NON AFRICAN AMERICAN: 100 mL/min/{1.73_m2} (ref >59)
Glucose: 140 mg/dL — ABNORMAL HIGH (ref 65–99)
Potassium: 4.6 mmol/L (ref 3.5–5.2)
SODIUM: 137 mmol/L (ref 134–144)

## 2017-03-24 LAB — URINALYSIS
BILIRUBIN UA: NEGATIVE
Leukocytes, UA: NEGATIVE
NITRITE UA: NEGATIVE
PH UA: 6.5 (ref 5.0–7.5)
RBC UA: NEGATIVE
UUROB: 1 mg/dL (ref 0.2–1.0)

## 2017-03-24 LAB — CBC
HEMATOCRIT: 46.1 % (ref 37.5–51.0)
HEMOGLOBIN: 15.5 g/dL (ref 13.0–17.7)
MCH: 28.9 pg (ref 26.6–33.0)
MCHC: 33.6 g/dL (ref 31.5–35.7)
MCV: 86 fL (ref 79–97)
Platelets: 166 10*3/uL (ref 150–379)
RBC: 5.37 x10E6/uL (ref 4.14–5.80)
RDW: 14.4 % (ref 12.3–15.4)
WBC: 10.2 10*3/uL (ref 3.4–10.8)

## 2017-03-24 MED ORDER — METOPROLOL SUCCINATE ER 100 MG PO TB24: ORAL_TABLET | ORAL | 3 refills | Status: DC

## 2017-03-24 NOTE — Patient Instructions (Addendum)
Your physician has recommended you make the following change in your medication:  1.) stop amlodipine 2.) increase your Toprol XL (metoprolol succinate) to 100 mg every morning and 50 mg every evening  Your physician recommends that you return for lab work today  (BMET, CBC, UA)  Your physician recommends that you schedule a follow-up appointment in: [place].

## 2017-04-04 ENCOUNTER — Ambulatory Visit (INDEPENDENT_AMBULATORY_CARE_PROVIDER_SITE_OTHER): Payer: Medicare Other | Admitting: Gastroenterology

## 2017-04-04 ENCOUNTER — Encounter: Payer: Self-pay | Admitting: Gastroenterology

## 2017-04-04 VITALS — BP 100/60 | HR 100 | Ht 70.0 in | Wt 240.5 lb

## 2017-04-04 DIAGNOSIS — E118 Type 2 diabetes mellitus with unspecified complications: Secondary | ICD-10-CM | POA: Diagnosis not present

## 2017-04-04 DIAGNOSIS — Z720 Tobacco use: Secondary | ICD-10-CM | POA: Diagnosis not present

## 2017-04-04 DIAGNOSIS — Z6834 Body mass index (BMI) 34.0-34.9, adult: Secondary | ICD-10-CM

## 2017-04-04 DIAGNOSIS — I2510 Atherosclerotic heart disease of native coronary artery without angina pectoris: Secondary | ICD-10-CM | POA: Diagnosis not present

## 2017-04-04 DIAGNOSIS — R142 Eructation: Secondary | ICD-10-CM

## 2017-04-04 DIAGNOSIS — K219 Gastro-esophageal reflux disease without esophagitis: Secondary | ICD-10-CM

## 2017-04-04 DIAGNOSIS — J449 Chronic obstructive pulmonary disease, unspecified: Secondary | ICD-10-CM | POA: Diagnosis not present

## 2017-04-04 DIAGNOSIS — R140 Abdominal distension (gaseous): Secondary | ICD-10-CM | POA: Diagnosis not present

## 2017-04-04 DIAGNOSIS — K760 Fatty (change of) liver, not elsewhere classified: Secondary | ICD-10-CM | POA: Diagnosis not present

## 2017-04-04 DIAGNOSIS — E669 Obesity, unspecified: Secondary | ICD-10-CM

## 2017-04-04 MED ORDER — NA SULFATE-K SULFATE-MG SULF 17.5-3.13-1.6 GM/177ML PO SOLN: 1.0000 | Freq: Once | ORAL | 0 refills | Status: AC

## 2017-04-04 NOTE — Patient Instructions (Signed)
If you are age 57 or older, your body mass index should be between 23-30. Your Body mass index is 34.51 kg/m. If this is out of the aforementioned range listed, please consider follow up with your Primary Care Provider.  If you are age 66 or younger, your body mass index should be between 19-25. Your Body mass index is 34.51 kg/m. If this is out of the aformentioned range listed, please consider follow up with your Primary Care Provider.   You have been scheduled for an endoscopy and colonoscopy. Please follow the written instructions given to you at your visit today. Please pick up your prep supplies at the pharmacy within the next 1-3 days. If you use inhalers (even only as needed), please bring them with you on the day of your procedure. Your physician has requested that you go to www.startemmi.com and enter the access code given to you at your visit today. This web site gives a general overview about your procedure. However, you should still follow specific instructions given to you by our office regarding your preparation for the procedure.  You have been scheduled for an abdominal ultrasound at [facility] (1st floor of hospital) on 04-08-2017 at 930am. Please arrive 15 minutes prior to your appointment for registration. Make certain not to have anything to eat or drink 6 hours prior to your appointment. Should you need to reschedule your appointment, please contact radiology at [phone]. This test typically takes about 30 minutes to perform.  Thank you for choosing [facility]  Dr Wilfrid Lund III

## 2017-04-04 NOTE — Progress Notes (Signed)
[facility] Consult Note:  History: Troy [name] 04/04/2017  Referring physician: [name] M.D.  Reason for consult/chief complaint: Gas (belching) and Emesis   Subjective  HPI:  This is a 56 year old man referred by his cardiologist for frequent belching. It occurs multiple times a day and has been going on for at least several months. Sometimes after several episodes he will have what sounds like regurgitation. There is not been forceful vomiting. He gets intermittent heartburn, denies dysphagia, early satiety or weight loss. Unfortunately, he continues to smoke despite recommendations to quit considering his COPD and coronary artery disease with diastolic dysfunction.  He has never had colon cancer screening. He and his wife I recall that he was referred couple of years ago but were unable to follow-through for some reason. He denies change in bowel habits, rectal bleeding or family history of colon, esophageal or gastric cancer.  Lastly, he feels that his abdomen is somewhat protuberant and as if there is fluid in there. He drank alcohol from his teenage years until perhaps 15 years ago, and it sounds like it may have been heavy at times. A CT scan of the abdomen from 2012 showed fatty liver.  ROS:  Review of Systems  Constitutional: Positive for fatigue. Negative for appetite change and unexpected weight change.  HENT: Negative for mouth sores and voice change.   Eyes: Negative for pain and redness.  Respiratory: Positive for shortness of breath. Negative for cough.   Cardiovascular: Negative for chest pain and palpitations.  Gastrointestinal:       He was started on a PPI about 5 years ago for frequent heartburn. This medicine seems to control his symptoms reasonably well.  Genitourinary: Negative for dysuria and hematuria.  Musculoskeletal: Positive for arthralgias. Negative for myalgias.  Skin: Negative for pallor and rash.  Neurological: Negative  for weakness and headaches.  Hematological: Negative for adenopathy.     Past Medical History: Past Medical History:  Diagnosis Date  . Acute renal failure ([place])    in setting of NSAID use and orthopedic surgery 2010  . Anxiety and depression   . Chronic diastolic CHF (congestive heart failure) ([place])    a. Echo 6/17: severe conc LVH, vigorous EF, EF 65-70%, no dynamic obstruction, no RWMA, Gr 1 DD, mild TR  //  b. LHC 8/17: no sig CAD, LVEDP 28  . COPD (chronic obstructive pulmonary disease) ([place])   . DM2 (diabetes mellitus, type 2) ([place])   . Family history of early CAD   . 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 History of cardiac catheterization    a. [place] 2002: irregs  //  b. LHC in 8/17: no sig CAD, apical DK, hyperdynamic LV, LVEDP 28  . History of nuclear stress test    a. Nuc 7/17: Overall, intermediate risk nuclear stress test secondary to small size of apical lateral defect and reduced ejection fraction.  EF 43%  . HLD (hyperlipidemia)   . HTN (hypertension)   . Injuries     crushing injury to both his feet in February 2010.   Marland Kitchen Kidney calculi   . Tobacco abuse    I reviewed his most recent two Cardiology notes by Dr. [name] Did not get prior colonscopy via PCP Past Surgical History: Past Surgical History:  Procedure Laterality Date  . CARDIAC CATHETERIZATION N/A 01/22/2016   Procedure: Left Heart Cath and Coronary Angiography;  Surgeon: Peter M Martinique, MD;  Location: [facility];  Service: Cardiovascular;  Laterality: N/A;  . FOOT AMPUTATION Bilateral   . LITHOTRIPSY    . TENDON LENGTHENING Bilateral    calf  . TONSILLECTOMY       Family History: Family History  Problem Relation Age of Onset  . Leukemia Mother 49       died  . Lung cancer Father 66       died  . Heart attack Brother 68  . Heart attack Brother 1  . Hypertension Brother        X3  . Hypertension Sister        X69  . Diabetes Sister   . Stroke Sister   .  Diabetes Sister   . Other Brother        Musician accident    Social History: Social History   Social History  . Marital status: Married    Spouse name: N/A  . Number of children: 1  . Years of education: N/A   Occupational History  . disable    Social History Main Topics  . Smoking status: Current Every Day Smoker    Packs/day: 0.50    Years: 40.00    Types: Cigarettes  . Smokeless tobacco: Never Used  . Alcohol use No  . Drug use: No  . Sexual activity: Not Asked   Other Topics Concern  . None   Social History Narrative  . None    Allergies: Allergies  Allergen Reactions  . Hydromorphone Hcl Er Anaphylaxis  . Nucynta [Tapentadol Hydrochloride] Other (See Comments)    Throat swells  . Exalamide Other (See Comments)  . Tapentadol Other (See Comments)    Throat swells Angiodema    Outpatient Meds: Current Outpatient Prescriptions  Medication Sig Dispense Refill  . albuterol (PROVENTIL HFA;VENTOLIN HFA) 108 (90 Base) MCG/ACT inhaler Inhale 2 puffs into the lungs every 6 (six) hours as needed for wheezing or shortness of breath. 1 Inhaler 6  . amitriptyline (ELAVIL) 150 MG tablet Take 150 mg by mouth at bedtime.      Marland Kitchen aspirin EC 81 MG tablet Take 1 tablet (81 mg total) by mouth daily. 90 tablet 3  . dapagliflozin propanediol (FARXIGA) 10 MG TABS tablet Take 10 mg by mouth daily.    . Dulaglutide 1.5 MG/0.5ML SOPN Inject into the skin.    . furosemide (LASIX) 80 MG tablet Take 1 tablet (80 mg total) by mouth daily. 30 tablet 9  . glipiZIDE (GLUCOTROL) 10 MG tablet Take 10 mg by mouth 2 (two) times daily before a meal.    . hydrocortisone 2.5 % cream Apply 1 application topically 2 (two) times daily.    Marland Kitchen ipratropium (ATROVENT) 0.02 % nebulizer solution Take 2.5 mLs (0.5 mg total) by nebulization 4 (four) times daily. 75 mL 12  . LORazepam (ATIVAN) 1 MG tablet Take 1 mg by mouth 2 (two) times daily. anxiety    . losartan (COZAAR) 50 MG tablet TAKE 1 TABLET (50 MG  TOTAL) BY MOUTH DAILY. 90 tablet 3  . metFORMIN (GLUCOPHAGE-XR) 500 MG 24 hr tablet Take 500 mg by mouth 2 (two) times daily.    . metoprolol succinate (TOPROL-XL) 100 MG 24 hr tablet Take 100 mg every AM and 50 mg every PM. Take with or immediately following a meal. 135 tablet 3  . omeprazole (PRILOSEC) 40 MG capsule Take 40 mg by mouth daily.    Marland Kitchen oxyCODONE-acetaminophen (PERCOCET) 10-325 MG per tablet Take 1 tablet by  mouth every 6 (six) hours as needed. pain    . potassium chloride SA (K-DUR,KLOR-CON) 20 MEQ tablet Take 1.5 tablets (30 mEq total) by mouth daily. 45 tablet 3  . simvastatin (ZOCOR) 40 MG tablet Take 40 mg by mouth daily.    . Tiotropium Bromide Monohydrate 2.5 MCG/ACT AERS Inhale 1 puff into the lungs daily.    Marland Kitchen tiZANidine (ZANAFLEX) 4 MG capsule Take 8-12 mg by mouth at bedtime.     . traZODone (DESYREL) 50 MG tablet Take 50 mg by mouth at bedtime.     . Na Sulfate-K Sulfate-Mg Sulf 17.5-3.13-1.6 GM/177ML SOLN Take 1 kit by mouth once. 354 mL 0   No current facility-administered medications for this visit.    PPI for 5 years for heartburn   ___________________________________________________________________ Objective   Exam:  BP 100/60 (BP Location: Left Arm, Patient Position: Sitting, Cuff Size: Normal)   Pulse 100   Ht _0  (1.778 m) Comment: height measured without shoes  Wt 240 lb 8 oz (109.1 kg)   BMI 34.51 kg/m    General: this is a(n) Obese and chronically ill-appearing man   Eyes: sclera anicteric, no redness  ENT: oral mucosa moist without lesions, no cervical or supraclavicular lymphadenopathy, good dentition  CV: RRR without murmur, S1/S2, no JVD, no peripheral edema  Resp: clear to auscultation bilaterally, normal RR and effort noted  GI: soft, no tenderness, with active bowel sounds. No guarding or palpable organomegaly noted. Positive bulging flanks and fluid wave, no shifting dullness.  Skin; warm and dry, no rash or jaundice  noted  Neuro: awake, alert and oriented x 3. Normal gross motor function and fluent speech  Labs:  CBC Latest Ref Rng & Units 03/24/2017 07/02/2016 01/15/2016  WBC 3.4 - 10.8 x10E3/uL 10.2 WILL FOLLOW 10.0  Hemoglobin 13.0 - 17.7 g/dL 15.5 WILL FOLLOW 14.7  Hematocrit 37.5 - 51.0 % 46.1 WILL FOLLOW 44.9  Platelets 150 - 379 x10E3/uL 166 WILL FOLLOW 139(L)   INR nml 01/2016   CMP Latest Ref Rng & Units 03/24/2017 02/10/2017 09/21/2016  Glucose 65 - 99 mg/dL 140(H) 126(H) 162(H)  BUN 6 - 24 mg/dL _1 Creatinine 0.76 - 1.27 mg/dL 0.79 0.66(L) 0.85  Sodium 134 - 144 mmol/L 137 137 140  Potassium 3.5 - 5.2 mmol/L 4.6 3.6 4.6  Chloride 96 - 106 mmol/L 98 96 97  CO2 20 - 29 mmol/L _2 Calcium 8.7 - 10.2 mg/dL 9.2 9.0 9.5  Total Protein 6.0 - 8.3 g/dL - - -  Total Bilirubin 0.3 - 1.2 mg/dL - - -  Alkaline Phos 39 - 117 U/L - - -  AST 0 - 37 U/L - - -  ALT 0 - 53 U/L - - -    Radiologic Studies:  CTAP 01/2011 - fatty liver  Assessment: Encounter Diagnoses  Name Primary?  . Belching   . Gastroesophageal reflux disease, esophagitis presence not specified Yes  . Abdominal distension   . Fatty liver   . Chronic obstructive pulmonary disease, unspecified COPD type ([place])   . Type 2 diabetes mellitus with complication, unspecified whether long term insulin use ([place])   . Tobacco abuse   . Class 1 obesity with serious comorbidity and body mass index (BMI) of 34.0 to 34.9 in adult, unspecified obesity type     His belching is most likely benign and is typically from aerophagia, in this case exacerbated by smoking. However, he has long-standing PPI use for chronic  heartburn, and his smoking alcohol use or additional risk factors for Barrett's esophagus. He is never been screened for colon cancer.  He has multiple medical issues and makes poor diet and lifestyle choices. Of course, I strongly advised him to quit smoking, which he has been told in the past.  I cannot determine if he  truly has ascites on exam due to his body habitus. He had fatty liver which is probably a combination of alcoholic and nonalcoholic, and it is possible that it progressed to cirrhosis since 2012. He does not have any other physical exam findings of cirrhosis, but his platelets are at the low end of normal.  Plan:  EGD and colonoscopy.  He is agreeable after a thorough discussion of procedure and risks.  The benefits and risks of the planned procedure were described in detail with the patient or (when appropriate) their health care proxy.  Risks were outlined as including, but not limited to, bleeding, infection, perforation, adverse medication reaction leading to cardiac or pulmonary decompensation, or pancreatitis (if ERCP).  The limitation of incomplete mucosal visualization was also discussed.  No guarantees or warranties were given.  Patient at increased risk for cardiopulmonary complications of procedure due to medical comorbidities.   Abdominal ultrasound to evaluate for possible ascites  Thank you for the courtesy of this consult.  Please call me with any questions or concerns.  Nelida Meuse III  CC: [name], MD  [name] M.D.

## 2017-04-07 ENCOUNTER — Other Ambulatory Visit: Payer: Self-pay

## 2017-04-08 ENCOUNTER — Encounter: Payer: Self-pay | Admitting: Internal Medicine

## 2017-04-08 ENCOUNTER — Ambulatory Visit (HOSPITAL_COMMUNITY)
Admission: RE | Admit: 2017-04-08 | Discharge: 2017-04-08 | Disposition: A | Discharge status: Home / Self Care | Payer: Medicare Other | Source: Ambulatory Visit | Attending: Gastroenterology | Admitting: Gastroenterology

## 2017-04-08 DIAGNOSIS — Z720 Tobacco use: Secondary | ICD-10-CM

## 2017-04-08 DIAGNOSIS — E118 Type 2 diabetes mellitus with unspecified complications: Secondary | ICD-10-CM

## 2017-04-08 DIAGNOSIS — R142 Eructation: Secondary | ICD-10-CM

## 2017-04-08 DIAGNOSIS — K219 Gastro-esophageal reflux disease without esophagitis: Secondary | ICD-10-CM | POA: Diagnosis present

## 2017-04-08 DIAGNOSIS — E669 Obesity, unspecified: Secondary | ICD-10-CM

## 2017-04-08 DIAGNOSIS — R140 Abdominal distension (gaseous): Secondary | ICD-10-CM

## 2017-04-08 DIAGNOSIS — J449 Chronic obstructive pulmonary disease, unspecified: Secondary | ICD-10-CM

## 2017-04-08 DIAGNOSIS — K760 Fatty (change of) liver, not elsewhere classified: Secondary | ICD-10-CM | POA: Diagnosis not present

## 2017-04-08 DIAGNOSIS — Z6834 Body mass index (BMI) 34.0-34.9, adult: Secondary | ICD-10-CM

## 2017-04-25 ENCOUNTER — Encounter: Payer: Self-pay | Admitting: Internal Medicine

## 2017-04-25 ENCOUNTER — Ambulatory Visit (INDEPENDENT_AMBULATORY_CARE_PROVIDER_SITE_OTHER): Payer: Medicare Other | Admitting: Internal Medicine

## 2017-04-25 VITALS — BP 110/62 | HR 102 | Ht 70.0 in | Wt 244.8 lb

## 2017-04-25 DIAGNOSIS — R0600 Dyspnea, unspecified: Secondary | ICD-10-CM

## 2017-04-25 DIAGNOSIS — I2510 Atherosclerotic heart disease of native coronary artery without angina pectoris: Secondary | ICD-10-CM | POA: Diagnosis not present

## 2017-04-25 DIAGNOSIS — I10 Essential (primary) hypertension: Secondary | ICD-10-CM | POA: Diagnosis not present

## 2017-04-25 DIAGNOSIS — I5032 Chronic diastolic (congestive) heart failure: Secondary | ICD-10-CM | POA: Diagnosis not present

## 2017-04-25 DIAGNOSIS — J449 Chronic obstructive pulmonary disease, unspecified: Secondary | ICD-10-CM

## 2017-04-25 MED ORDER — FUROSEMIDE 80 MG PO TABS: 40.0000 mg | ORAL_TABLET | Freq: Every day | ORAL | 9 refills | Status: DC

## 2017-04-25 NOTE — Progress Notes (Signed)
Cardiology Office Note   Date:  04/25/2017   ID:  Troy [name], DOB [birth year], MRN [id number]  PCP:  Troy Housekeeper, MD  Cardiologist:   Troy Carnes, MD   F/U of diastolic CHF and dyspnea      History of Present Illness: Troy [name] is a 56 y.o. male with a history of diastolic CHF, DM, HL HTN.  Cath in 2002 normal LVEF  Luminal irreg .  Echo showed normal LVEF and mild diastolic dysfuncton  [place] with possible apical ischemia  LHC with no signif CAD   There was a apical DK noted  LVEDP 28   I saw the pt in Aug 2018    I recomm increasing Toprol XL to 100  Continue amlodipie    Since seen he still gets SOB with activity  Wife says he gives out  easliy  He denies CP Occasional dizziness   Current Meds  Medication Sig  . albuterol (PROVENTIL HFA;VENTOLIN HFA) 108 (90 Base) MCG/ACT inhaler Inhale 2 puffs into the lungs every 6 (six) hours as needed for wheezing or shortness of breath.  Marland Kitchen amitriptyline (ELAVIL) 150 MG tablet Take 150 mg by mouth at bedtime.    Marland Kitchen aspirin EC 81 MG tablet Take 1 tablet (81 mg total) by mouth daily.  . dapagliflozin propanediol (FARXIGA) 10 MG TABS tablet Take 10 mg by mouth daily.  . Dulaglutide 1.5 MG/0.5ML SOPN Inject into the skin.  . furosemide (LASIX) 80 MG tablet Take 1 tablet (80 mg total) by mouth daily.  Marland Kitchen glipiZIDE (GLUCOTROL) 10 MG tablet Take 10 mg by mouth 2 (two) times daily before a meal.  . hydrocortisone 2.5 % cream Apply 1 application topically 2 (two) times daily.  Marland Kitchen ipratropium (ATROVENT) 0.02 % nebulizer solution Take 2.5 mLs (0.5 mg total) by nebulization 4 (four) times daily.  Marland Kitchen LORazepam (ATIVAN) 1 MG tablet Take 1 mg by mouth 2 (two) times daily. anxiety  . losartan (COZAAR) 50 MG tablet TAKE 1 TABLET (50 MG TOTAL) BY MOUTH DAILY.  . metFORMIN (GLUCOPHAGE-XR) 500 MG 24 hr tablet Take 500 mg by mouth 2 (two) times daily.  . metoprolol succinate (TOPROL-XL) 100 MG 24 hr tablet Take 100 mg every AM and 50 mg every PM. Take with  or immediately following a meal.  . omeprazole (PRILOSEC) 40 MG capsule Take 40 mg by mouth daily.  Marland Kitchen oxyCODONE-acetaminophen (PERCOCET) 10-325 MG per tablet Take 1 tablet by mouth every 6 (six) hours as needed. pain  . potassium chloride SA (K-DUR,KLOR-CON) 20 MEQ tablet Take 1.5 tablets (30 mEq total) by mouth daily.  . simvastatin (ZOCOR) 40 MG tablet Take 40 mg by mouth daily.  . Tiotropium Bromide Monohydrate 2.5 MCG/ACT AERS Inhale 1 puff into the lungs daily.  Marland Kitchen tiZANidine (ZANAFLEX) 4 MG capsule Take 8-12 mg by mouth at bedtime.   . traZODone (DESYREL) 50 MG tablet Take 50 mg by mouth at bedtime.      Allergies:   Hydromorphone hcl er; Nucynta [tapentadol hydrochloride]; Exalamide; and Tapentadol   Past Medical History:  Diagnosis Date  . Acute renal failure ([place])    in setting of NSAID use and orthopedic surgery 2010  . Anxiety and depression   . Chronic diastolic CHF (congestive heart failure) ([place])    a. Echo 6/17: severe conc LVH, vigorous EF, EF 65-70%, no dynamic obstruction, no RWMA, Gr 1 DD, mild TR  //  b. LHC 8/17: no sig CAD, LVEDP 28  . COPD (  chronic obstructive pulmonary disease) ([place])   . DM2 (diabetes mellitus, type 2) ([place])   . Family history of early CAD   . 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 History of cardiac catheterization    a. [place] 2002: irregs  //  b. LHC in 8/17: no sig CAD, apical DK, hyperdynamic LV, LVEDP 28  . History of nuclear stress test    a. Nuc 7/17: Overall, intermediate risk nuclear stress test secondary to small size of apical lateral defect and reduced ejection fraction.  EF 43%  . HLD (hyperlipidemia)   . HTN (hypertension)   . Injuries     crushing injury to both his feet in February 2010.   Marland Kitchen Kidney calculi   . Tobacco abuse     Past Surgical History:  Procedure Laterality Date  . FOOT AMPUTATION Bilateral   . LITHOTRIPSY    . TENDON LENGTHENING Bilateral    calf  . TONSILLECTOMY       Social  History:  The patient  reports that he has been smoking cigarettes.  He has a 20.00 pack-year smoking history. he has never used smokeless tobacco. He reports that he does not drink alcohol or use drugs.   Family History:  The patient's family history includes Diabetes in his sister and sister; Heart attack (age of onset: 41) in his brother; Heart attack (age of onset: 3) in his brother; Hypertension in his brother and sister; Leukemia (age of onset: 15) in his mother; Lung cancer (age of onset: 10) in his father; Other in his brother; Stroke in his sister.    ROS:  Please see the history of present illness. All other systems are reviewed and  Negative to the above problem except as noted.    PHYSICAL EXAM: VS:  BP 110/62   Pulse (!) 102   Ht 5' 10"  (1.778 m)   Wt 244 lb 12.8 oz (111 kg)   SpO2 95%   BMI 35.13 kg/m   Walking in halls  Sats 98%  Pulse 100 to 116    GEN: Obese 56 yo, in no acute distress  HEENT: normal   Neck: JVP normal   carotid bruits, or masses Cardiac: RRR; II/VI systollic murmur LSB to base  , rubs, or gallops,tr edema  Respiratory:  clear to auscultation bilaterally, normal work of breathing GI: soft, nontender, nondistended, + BS  No hepatomegaly  MS: no deformity Moving all extremities   Skin: warm and dry, no rash Neuro:  Strength and sensation are intact Psych: euthymic mood, full affect   EKG:  EKG is not ordered today.   Lipid Panel    Component Value Date/Time   CHOL 124 08/27/2016 0845   TRIG 168 (H) 08/27/2016 0845   HDL 33 (L) 08/27/2016 0845   CHOLHDL 3.8 08/27/2016 0845   LDLCALC 57 08/27/2016 0845      Wt Readings from Last 3 Encounters:  04/25/17 244 lb 12.8 oz (111 kg)  04/04/17 240 lb 8 oz (109.1 kg)  03/24/17 241 lb 9.6 oz (109.6 kg)      ASSESSMENT AND PLAN:  1  Diastolic CHF  Cath last year showed vigorous LV with apical dyskinesis  LVEDP was severely elevated at 38  No signif CAD   Echo with severe LVH Pt still SOB    Orthostatic today show drop in BP (114/ to 102/) and increase in HR (80 to 110)  I would conitnue b  blicker   Cut back on lasix I have asked Troy [name] to email in with how he is feeling with change in lasix  May back off even more    2  HTN  Adjust meds as noted above   3   Pulm Followed by R Byrum  Alpha 1 antitrypsin genotype MS  Level nrmal  Statedo n atrovent  Current medicines are reviewed at length with the patient today.  The patient does not have concerns regarding medicines.  Signed, Troy Carnes, MD  04/25/2017 9:11 AM    [address], [place], [place]  [address] Phone: [phone]; Fax: [phone]

## 2017-04-25 NOTE — Patient Instructions (Signed)
Your physician has recommended you make the following change in your medication:  1.) NO LASIX TODAY 2.) DECREASE LASIX TO 40 MG ONCE DAILY  Please weigh daily.  Record weights, BP and HR daily and email or call in about 1 week with this information and how you are feeling.

## 2017-05-09 ENCOUNTER — Telehealth: Payer: Self-pay | Admitting: Gastroenterology

## 2017-05-09 ENCOUNTER — Encounter: Payer: Self-pay | Admitting: Gastroenterology

## 2017-05-09 ENCOUNTER — Ambulatory Visit (AMBULATORY_SURGERY_CENTER): Payer: Medicare Other | Admitting: Gastroenterology

## 2017-05-09 ENCOUNTER — Other Ambulatory Visit: Payer: Self-pay

## 2017-05-09 VITALS — BP 135/65 | HR 96 | Temp 97.8°F | Resp 17 | Ht 70.0 in | Wt 240.0 lb

## 2017-05-09 DIAGNOSIS — D124 Benign neoplasm of descending colon: Secondary | ICD-10-CM

## 2017-05-09 DIAGNOSIS — Z1211 Encounter for screening for malignant neoplasm of colon: Secondary | ICD-10-CM

## 2017-05-09 DIAGNOSIS — K219 Gastro-esophageal reflux disease without esophagitis: Secondary | ICD-10-CM | POA: Diagnosis not present

## 2017-05-09 DIAGNOSIS — K2270 Barrett's esophagus without dysplasia: Secondary | ICD-10-CM

## 2017-05-09 DIAGNOSIS — R142 Eructation: Secondary | ICD-10-CM | POA: Diagnosis not present

## 2017-05-09 DIAGNOSIS — D123 Benign neoplasm of transverse colon: Secondary | ICD-10-CM

## 2017-05-09 DIAGNOSIS — K635 Polyp of colon: Secondary | ICD-10-CM

## 2017-05-09 MED ORDER — SODIUM CHLORIDE 0.9 % IV SOLN: 500.0000 mL | INTRAVENOUS | Status: DC

## 2017-05-09 MED ORDER — FLEET ENEMA 7-19 GM/118ML RE ENEM
1.0000 | ENEMA | Freq: Once | RECTAL | Status: AC
  Administered 2017-05-09: 1 via RECTAL

## 2017-05-09 NOTE — Telephone Encounter (Signed)
Patient wife states patient has procedure today and has not used the bathroom since taking first part of prep last night. Patient requesting cb to know what to do.

## 2017-05-09 NOTE — Op Note (Signed)
[facility] Patient Name: Troy [name] Procedure Date: 05/09/2017 2:09 PM MRN: [id number] Endoscopist: Mallie Mussel L. Loletha Carrow , MD Age: 56 Referring MD:  Date of Birth: 1960/11/19 Gender: Male Account #: [id number] Procedure:                Colonoscopy Indications:              Screening for colorectal malignant neoplasm, This                            is the patient's first colonoscopy Medicines:                Monitored Anesthesia Care Procedure:                Pre-Anesthesia Assessment:                           - Prior to the procedure, a History and Physical                            was performed, and patient medications and                            allergies were reviewed. The patient's tolerance of                            previous anesthesia was also reviewed. The risks                            and benefits of the procedure and the sedation                            options and risks were discussed with the patient.                            All questions were answered, and informed consent                            was obtained. Anticoagulants: The patient has taken                            aspirin. It was decided not to withhold this                            medication prior to the procedure. ASA Grade                            Assessment: III - A patient with severe systemic                            disease. After reviewing the risks and benefits,                            the patient was deemed in satisfactory condition to  undergo the procedure.                           After obtaining informed consent, the colonoscope                            was passed under direct vision. Throughout the                            procedure, the patient's blood pressure, pulse, and                            oxygen saturations were monitored continuously. The                            Colonoscope was introduced through the anus and                           advanced to the the cecum, identified by                            appendiceal orifice and ileocecal valve. The                            colonoscopy was performed with difficulty due to                            significant looping. Successful completion of the                            procedure was aided by using manual pressure. The                            patient tolerated the procedure well. The quality                            of the bowel preparation was initially fair, then                            improved to good with lavage. The ileocecal valve,                            appendiceal orifice, and rectum were photographed.                            The bowel preparation used was SUPREP. Scope In: 2:21:57 PM Scope Out: 2:47:45 PM Scope Withdrawal Time: 0 hours 18 minutes 21 seconds  Total Procedure Duration: 0 hours 25 minutes 48 seconds  Findings:                 The perianal and digital rectal examinations were                            normal.  A 2 mm polyp was found in the distal descending                            colon. The polyp was sessile. The polyp was removed                            with a cold biopsy forceps. Resection and retrieval                            were complete.                           Six sessile polyps were found in the proximal                            descending colon, proximal transverse colon, distal                            transverse colon and hepatic flexure. The polyps                            were 4 to 8 mm in size. These polyps were removed                            with a cold snare. Resection and retrieval were                            complete.                           The exam was otherwise without abnormality on                            direct and retroflexion views. Complications:            No immediate complications. Estimated Blood Loss:     Estimated  blood loss: none. Impression:               - One 2 mm polyp in the distal descending colon,                            removed with a cold biopsy forceps. Resected and                            retrieved.                           - Six 4 to 8 mm polyps in the proximal descending                            colon, in the proximal transverse colon, in the                            distal transverse colon and at the hepatic flexure,  removed with a cold snare. Resected and retrieved.                           - The examination was otherwise normal on direct                            and retroflexion views. Recommendation:           - Patient has a contact number available for                            emergencies. The signs and symptoms of potential                            delayed complications were discussed with the                            patient. Return to normal activities tomorrow.                            Written discharge instructions were provided to the                            patient.                           - Resume previous diet.                           - Continue present medications.                           - Await pathology results.                           - Repeat colonoscopy in 3 years for surveillance.                            (2-day prep and afternoon procedure) Mallie Mussel L. Loletha Carrow, MD 05/09/2017 2:56:31 PM This report has been signed electronically.

## 2017-05-09 NOTE — Progress Notes (Signed)
To PACU, VSS. Report to [email or web address] 

## 2017-05-09 NOTE — Telephone Encounter (Signed)
[name] called this morning. Stated patient drank the first part of his prep at 1800 last night as instructed and has not had a bowel movement as of yet. Patient is scheduled for endoscopy/colonoscopy at 1430 today. Instructed patient's wife to have patient take the 2nd bottle of prep now as per instructions and call us back at 1100 today to let us know how he's doing. [name] verbalizes understanding.

## 2017-05-09 NOTE — Patient Instructions (Signed)
**   Handouts given on polyps and barretts **   YOU HAD AN ENDOSCOPIC PROCEDURE TODAY AT [facility]:   Refer to the procedure report that was given to you for any specific questions about what was found during the examination.  If the procedure report does not answer your questions, please call your gastroenterologist to clarify.  If you requested that your care partner not be given the details of your procedure findings, then the procedure report has been included in a sealed envelope for you to review at your convenience later.  YOU SHOULD EXPECT: Some feelings of bloating in the abdomen. Passage of more gas than usual.  Walking can help get rid of the air that was put into your GI tract during the procedure and reduce the bloating. If you had a lower endoscopy (such as a colonoscopy or flexible sigmoidoscopy) you may notice spotting of blood in your stool or on the toilet paper. If you underwent a bowel prep for your procedure, you may not have a normal bowel movement for a few days.  Please Note:  You might notice some irritation and congestion in your nose or some drainage.  This is from the oxygen used during your procedure.  There is no need for concern and it should clear up in a day or so.  SYMPTOMS TO REPORT IMMEDIATELY:   Following lower endoscopy (colonoscopy or flexible sigmoidoscopy):  Excessive amounts of blood in the stool  Significant tenderness or worsening of abdominal pains  Swelling of the abdomen that is new, acute  Fever of 100F or higher   Following upper endoscopy (EGD)  Vomiting of blood or coffee ground material  New chest pain or pain under the shoulder blades  Painful or persistently difficult swallowing  New shortness of breath  Fever of 100F or higher  Black, tarry-looking stools  For urgent or emergent issues, a gastroenterologist can be reached at any hour by calling [phone].   DIET:  We do recommend a small meal at first, but  then you may proceed to your regular diet.  Drink plenty of fluids but you should avoid alcoholic beverages for 24 hours.  ACTIVITY:  You should plan to take it easy for the rest of today and you should NOT DRIVE or use heavy machinery until tomorrow (because of the sedation medicines used during the test).    FOLLOW UP: Our staff will call the number listed on your records the next business day following your procedure to check on you and address any questions or concerns that you may have regarding the information given to you following your procedure. If we do not reach you, we will leave a message.  However, if you are feeling well and you are not experiencing any problems, there is no need to return our call.  We will assume that you have returned to your regular daily activities without incident.  If any biopsies were taken you will be contacted by phone or by letter within the next 1-3 weeks.  Please call us at [phone] if you have not heard about the biopsies in 3 weeks.    SIGNATURES/CONFIDENTIALITY: You and/or your care partner have signed paperwork which will be entered into your electronic medical record.  These signatures attest to the fact that that the information above on your After Visit Summary has been reviewed and is understood.  Full responsibility of the confidentiality of this discharge information lies with you and/or your care-partner.

## 2017-05-09 NOTE — Op Note (Signed)
[facility] Patient Name: Troy [name] Procedure Date: 05/09/2017 2:09 PM MRN: [id number] Endoscopist: Mallie Mussel L. Loletha Carrow , MD Age: 56 Referring MD:  Date of Birth: 1960/07/01 Gender: Male Account #: [id number] Procedure:                Upper GI endoscopy Indications:              Heartburn, Eructation Medicines:                Monitored Anesthesia Care Procedure:                Pre-Anesthesia Assessment:                           - Prior to the procedure, a History and Physical                            was performed, and patient medications and                            allergies were reviewed. The patient's tolerance of                            previous anesthesia was also reviewed. The risks                            and benefits of the procedure and the sedation                            options and risks were discussed with the patient.                            All questions were answered, and informed consent                            was obtained. Anticoagulants: The patient has taken                            aspirin. It was decided not to withhold this                            medication prior to the procedure. ASA Grade                            Assessment: III - A patient with severe systemic                            disease. After reviewing the risks and benefits,                            the patient was deemed in satisfactory condition to                            undergo the procedure.  After obtaining informed consent, the endoscope was                            passed under direct vision. Throughout the                            procedure, the patient's blood pressure, pulse, and                            oxygen saturations were monitored continuously. The                            Endoscope was introduced through the mouth, and                            advanced to the second part of duodenum. The upper                    GI endoscopy was accomplished without difficulty.                            The patient tolerated the procedure well. Scope In: Scope Out: Findings:                 The larynx was normal.                           There were esophageal mucosal changes suspicious                            for short-segment Barrett's esophagus present in                            the distal esophagus. The maximum longitudinal                            extent of these mucosal changes was 3 cm in length.                            Mucosa was biopsied with a cold forceps for                            histology in a targeted manner at 36 and 38 cm from                            the incisors. A total of 2 specimen bottles were                            sent to pathology.                           A medium amount of retained food and bilious liquid                            was found in the  gastric body.                           The cardia and gastric fundus were normal on                            retroflexion.                           The examined duodenum was normal. Complications:            No immediate complications. Estimated Blood Loss:     Estimated blood loss was minimal. Impression:               - Normal larynx.                           - Esophageal mucosal changes suspicious for                            short-segment Barrett's esophagus. Biopsied.                           - A medium amount of food (residue) in the stomach.                           - Normal examined duodenum.                           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 Recommendation:           - Patient has a contact number available for                            emergencies. The signs and symptoms of potential                            delayed complications were discussed with the                            patient. Return to normal  activities tomorrow.                            Written discharge instructions were provided to the                            patient.                           - Resume previous diet.                           - Continue present medications.                           - Discontinue  the use of any products containing                            nicotine.                           - Await pathology results.                           - See the other procedure note for documentation of                            additional recommendations. Forbes Loll L. Loletha Carrow, MD 05/09/2017 2:52:33 PM This report has been signed electronically.

## 2017-05-09 NOTE — Progress Notes (Signed)
Oncall Note [name] called around 9:30 PM last night because they were advised to call if no bowel movement by 9pm. He had just finished first half of the split dose. Reassured her and advised to continue drinking clear liquids and finish the second half of split prep as per instructions K. Denzil Magnuson , MD [phone] Mon-Fri 8a-5p [phone] after 5p, weekends, holidays

## 2017-05-09 NOTE — Progress Notes (Signed)
Pt. Reports no change in his medical or surgical history since his pre-visit 04/04/2017.

## 2017-05-09 NOTE — Progress Notes (Signed)
Pt. Self-administered Fleets Enema.  Results clear with stool sediment.  Results reported to Dr. Loletha Carrow.

## 2017-05-09 NOTE — Progress Notes (Signed)
Called to room to assist during endoscopic procedure.  Patient ID and intended procedure confirmed with present staff. Received instructions for my participation in the procedure from the performing physician.  

## 2017-05-10 ENCOUNTER — Telehealth: Payer: Self-pay | Admitting: *Deleted

## 2017-05-10 NOTE — Telephone Encounter (Signed)
  Follow up Call-  Call back number 05/09/2017  Post procedure Call Back phone  # [phone]  Permission to leave phone message Yes  Some recent data might be hidden     Patient questions:  Do you have a fever, pain , or abdominal swelling? No. Pain Score  0 *  Have you tolerated food without any problems? Yes.    Have you been able to return to your normal activities? Yes.    Do you have any questions about your discharge instructions: Diet   No. Medications  No. Follow up visit  No.  Do you have questions or concerns about your Care? No.  Actions: * If pain score is 4 or above: No action needed, pain <4.

## 2017-05-17 ENCOUNTER — Encounter: Payer: Self-pay | Admitting: Gastroenterology

## 2017-07-08 DIAGNOSIS — E785 Hyperlipidemia, unspecified: Secondary | ICD-10-CM | POA: Insufficient documentation

## 2017-07-08 DIAGNOSIS — E1169 Type 2 diabetes mellitus with other specified complication: Secondary | ICD-10-CM | POA: Insufficient documentation

## 2017-07-20 NOTE — Telephone Encounter (Signed)
[facility]

## 2017-09-26 DIAGNOSIS — K219 Gastro-esophageal reflux disease without esophagitis: Secondary | ICD-10-CM | POA: Insufficient documentation

## 2017-10-25 ENCOUNTER — Other Ambulatory Visit: Payer: Self-pay | Admitting: Cardiology

## 2017-10-25 DIAGNOSIS — J449 Chronic obstructive pulmonary disease, unspecified: Secondary | ICD-10-CM

## 2017-11-23 ENCOUNTER — Ambulatory Visit (INDEPENDENT_AMBULATORY_CARE_PROVIDER_SITE_OTHER): Payer: Medicare HMO

## 2017-11-23 ENCOUNTER — Encounter (INDEPENDENT_AMBULATORY_CARE_PROVIDER_SITE_OTHER): Payer: Self-pay | Admitting: Orthopaedic Surgery

## 2017-11-23 ENCOUNTER — Ambulatory Visit (INDEPENDENT_AMBULATORY_CARE_PROVIDER_SITE_OTHER): Payer: Medicare HMO | Admitting: Orthopaedic Surgery

## 2017-11-23 VITALS — Ht 70.0 in | Wt 240.0 lb

## 2017-11-23 DIAGNOSIS — M79672 Pain in left foot: Secondary | ICD-10-CM

## 2017-11-23 MED ORDER — DOXYCYCLINE HYCLATE 100 MG PO TABS: 100.0000 mg | ORAL_TABLET | Freq: Two times a day (BID) | ORAL | 0 refills | Status: DC

## 2017-11-23 NOTE — Progress Notes (Signed)
Office Visit Note   Patient: Troy [name]           Date of Birth: [birth year]           MRN: [id number] Visit Date: 11/23/2017              Requested by: Troy Housekeeper, MD [address] [place], [place] [id number] PCP: Troy Housekeeper, MD   Assessment & Plan: Visit Diagnoses:  1. Pain in left foot     Plan: I stressed to him the importance of good blood glucose control.  I want to have him soak his foot daily and also be water and then dried off well and keep it dry.  I will send in some doxycycline as well.  I would like to see him back in 2 weeks to see how his foot wound is doing to see if we need to pare it down with a scalpel in the office in the.  All questions concerns were answered and addressed.  Follow-Up Instructions: Return in about 2 weeks (around 12/07/2017).   Orders:  Orders Placed This Encounter  Procedures  . XR Foot 2 Views Left   Meds ordered this encounter  Medications  . doxycycline (VIBRA-TABS) 100 MG tablet    Sig: Take 1 tablet (100 mg total) by mouth 2 (two) times daily.    Dispense:  30 tablet    Refill:  0      Procedures: No procedures performed   Clinical Data: No additional findings.   Subjective: Chief Complaint  Patient presents with  . 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  He is done well for long period time however his diabetes is significant in terms of poor blood glucose control.  He has had blood sugars as high as 400  recently and a hemoglobin A1c of over 10.  He has developed a wound for the last month at the end of his remaining left foot.  He tried to pare this down on his own.  He has had some clear drainage from this area.  He denies any recent illnesses but he has not had good blood glucose control.  HPI  Review of Systems He currently denies any headache, chest pain, shortness of breath, fever, chills, nausea, vomiting.  Objective: Vital Signs: Ht _0  (1.778 m)   Wt 240 lb (108.9 kg)   BMI 34.44 kg/m   Physical Exam Is alert and oriented x3 and in no acute distress Ortho Exam Examination of his left foot does show wound near the plantar medial aspect at the end of the foot.  There is large abundant callus in this area.  There is slight clear drainage but no purulence. Specialty Comments:  No specialty comments available.  Imaging: No results found.   PMFS History: Patient Active Problem List   Diagnosis Date Noted  . Snoring 06/07/2016  . Chest pain 01/22/2016  . Abnormal nuclear stress test 01/22/2016  . COPD (chronic obstructive pulmonary disease) ([facility]) 01/27/2011  . Pre-operative cardiovascular examination 01/27/2011  . Nonspecific abnormal electrocardiogram (ECG) (EKG) 01/27/2011  . Murmur 01/27/2011  . DM2 (diabetes mellitus, type 2) ([facility])   . HTN (hypertension)   . HLD (hyperlipidemia)   . Tobacco abuse    Past Medical History:  Diagnosis Date  .  Acute renal failure ([facility])    in setting of NSAID use and orthopedic surgery 2010  . Anxiety and depression   . Chronic diastolic CHF (congestive heart failure) ([facility])    a. Echo 6/17: severe conc LVH, vigorous EF, EF 65-70%, no dynamic obstruction, no RWMA, Gr 1 DD, mild TR  //  b. LHC 8/17: no sig CAD, LVEDP 28  . COPD (chronic obstructive pulmonary disease) ([facility])   . DM2 (diabetes mellitus, type 2) ([facility])   . Family history of early CAD   . 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 History of cardiac  catheterization    a. [place] 2002: irregs  //  b. LHC in 8/17: no sig CAD, apical DK, hyperdynamic LV, LVEDP 28  . History of nuclear stress test    a. Nuc 7/17: Overall, intermediate risk nuclear stress test secondary to small size of apical lateral defect and reduced ejection fraction.  EF 43%  . HLD (hyperlipidemia)   . HTN (hypertension)   . Injuries     crushing injury to both his feet in February 2010.   Marland Kitchen Kidney calculi   . Tobacco abuse     Family History  Problem Relation Age of Onset  . Leukemia Mother 10       died  . Lung cancer Father 37       died  . Heart attack Brother 6  . Heart attack Brother 35  . Hypertension Brother        X3  . Hypertension Sister        X84  . Diabetes Sister   . Stroke Sister   . Diabetes Sister   . Other Brother        Musician accident    Past Surgical History:  Procedure Laterality Date  . CARDIAC CATHETERIZATION N/A 01/22/2016   Procedure: Left Heart Cath and Coronary Angiography;  Surgeon: Peter M Martinique, MD;  Location: [facility];  Service: Cardiovascular;  Laterality: N/A;  . FOOT AMPUTATION Bilateral   . LITHOTRIPSY    . TENDON LENGTHENING Bilateral    calf  . TONSILLECTOMY     Social History   Occupational History  . Occupation: disable  Tobacco Use  . Smoking status: Current Every Day Smoker    Packs/day: 0.50    Years: 40.00    Pack years: 20.00    Types: Cigarettes  . Smokeless tobacco: Never Used  Substance and Sexual Activity  . Alcohol use: No  . Drug use: No  . Sexual activity: Not on file

## 2017-12-07 ENCOUNTER — Ambulatory Visit (INDEPENDENT_AMBULATORY_CARE_PROVIDER_SITE_OTHER): Payer: Medicare HMO | Admitting: Orthopaedic Surgery

## 2017-12-07 ENCOUNTER — Encounter (INDEPENDENT_AMBULATORY_CARE_PROVIDER_SITE_OTHER): Payer: Self-pay | Admitting: Orthopaedic Surgery

## 2017-12-07 DIAGNOSIS — E08621 Diabetes mellitus due to underlying condition with foot ulcer: Secondary | ICD-10-CM

## 2017-12-07 DIAGNOSIS — L97529 Non-pressure chronic ulcer of other part of left foot with unspecified severity: Secondary | ICD-10-CM

## 2017-12-07 NOTE — Progress Notes (Signed)
The patient is well-known to [name].  He is a poorly controlled diabetic who is 57 years old.  He has a history of a previous midfoot amputation that was posttraumatic.  He is developed a ulcer on the end of his amputation stump.  He has not been able get this to heal.  His blood glucose was running in the upper 300 range today.  On exam there is a malodorous soft tissue wound at the end of his midfoot amputation site.  There is no gross purulence but an obvious wound.  This point this does warrant an irrigation debridement with unroofing the ulcer and soft tissue to try to get good soft tissue healing.  This does not appear thus far to involve the bone.  I counseled him extensively again about good blood glucose control and medical compliance with his medicines.  We will work on getting the surgery set up for next week.  I would keep him overnight for IV antibiotics and blood glucose control.

## 2017-12-13 ENCOUNTER — Encounter (HOSPITAL_COMMUNITY): Payer: Self-pay

## 2017-12-13 ENCOUNTER — Other Ambulatory Visit (INDEPENDENT_AMBULATORY_CARE_PROVIDER_SITE_OTHER): Payer: Self-pay | Admitting: Physician Assistant

## 2017-12-13 ENCOUNTER — Other Ambulatory Visit: Payer: Self-pay

## 2017-12-13 ENCOUNTER — Encounter (HOSPITAL_COMMUNITY)
Admission: RE | Admit: 2017-12-13 | Discharge: 2017-12-13 | Disposition: A | Discharge status: Home / Self Care | Payer: Medicare HMO | Source: Ambulatory Visit | Attending: Orthopaedic Surgery | Admitting: Orthopaedic Surgery

## 2017-12-13 DIAGNOSIS — Z8249 Family history of ischemic heart disease and other diseases of the circulatory system: Secondary | ICD-10-CM | POA: Diagnosis not present

## 2017-12-13 DIAGNOSIS — Z01812 Encounter for preprocedural laboratory examination: Secondary | ICD-10-CM

## 2017-12-13 DIAGNOSIS — I10 Essential (primary) hypertension: Secondary | ICD-10-CM

## 2017-12-13 DIAGNOSIS — R9431 Abnormal electrocardiogram [ECG] [EKG]: Secondary | ICD-10-CM

## 2017-12-13 DIAGNOSIS — Z7984 Long term (current) use of oral hypoglycemic drugs: Secondary | ICD-10-CM | POA: Diagnosis not present

## 2017-12-13 DIAGNOSIS — E119 Type 2 diabetes mellitus without complications: Secondary | ICD-10-CM

## 2017-12-13 DIAGNOSIS — Z01810 Encounter for preprocedural cardiovascular examination: Secondary | ICD-10-CM | POA: Insufficient documentation

## 2017-12-13 DIAGNOSIS — Z888 Allergy status to other drugs, medicaments and biological substances status: Secondary | ICD-10-CM | POA: Diagnosis not present

## 2017-12-13 DIAGNOSIS — J449 Chronic obstructive pulmonary disease, unspecified: Secondary | ICD-10-CM | POA: Diagnosis not present

## 2017-12-13 DIAGNOSIS — F329 Major depressive disorder, single episode, unspecified: Secondary | ICD-10-CM | POA: Diagnosis not present

## 2017-12-13 DIAGNOSIS — Z89432 Acquired absence of left foot: Secondary | ICD-10-CM | POA: Diagnosis not present

## 2017-12-13 DIAGNOSIS — Z885 Allergy status to narcotic agent status: Secondary | ICD-10-CM | POA: Diagnosis not present

## 2017-12-13 DIAGNOSIS — I110 Hypertensive heart disease with heart failure: Secondary | ICD-10-CM | POA: Diagnosis not present

## 2017-12-13 DIAGNOSIS — L97524 Non-pressure chronic ulcer of other part of left foot with necrosis of bone: Secondary | ICD-10-CM | POA: Diagnosis not present

## 2017-12-13 DIAGNOSIS — Z7982 Long term (current) use of aspirin: Secondary | ICD-10-CM | POA: Diagnosis not present

## 2017-12-13 DIAGNOSIS — E785 Hyperlipidemia, unspecified: Secondary | ICD-10-CM | POA: Diagnosis not present

## 2017-12-13 DIAGNOSIS — I5032 Chronic diastolic (congestive) heart failure: Secondary | ICD-10-CM | POA: Diagnosis not present

## 2017-12-13 DIAGNOSIS — Z79899 Other long term (current) drug therapy: Secondary | ICD-10-CM | POA: Diagnosis not present

## 2017-12-13 DIAGNOSIS — F17210 Nicotine dependence, cigarettes, uncomplicated: Secondary | ICD-10-CM | POA: Diagnosis not present

## 2017-12-13 DIAGNOSIS — F419 Anxiety disorder, unspecified: Secondary | ICD-10-CM | POA: Diagnosis not present

## 2017-12-13 DIAGNOSIS — E11621 Type 2 diabetes mellitus with foot ulcer: Secondary | ICD-10-CM | POA: Diagnosis not present

## 2017-12-13 HISTORY — DX: Palpitations: R00.2

## 2017-12-13 HISTORY — DX: Type 2 diabetes mellitus with foot ulcer: E11.621

## 2017-12-13 HISTORY — DX: Type 2 diabetes mellitus with foot ulcer: L97.529

## 2017-12-13 LAB — CBC
HCT: 45.1 % (ref 39.0–52.0)
HEMOGLOBIN: 14.9 g/dL (ref 13.0–17.0)
MCH: 28.5 pg (ref 26.0–34.0)
MCHC: 33 g/dL (ref 30.0–36.0)
MCV: 86.2 fL (ref 78.0–100.0)
Platelets: 164 10*3/uL (ref 150–400)
RBC: 5.23 MIL/uL (ref 4.22–5.81)
RDW: 13.9 % (ref 11.5–15.5)
WBC: 10.8 10*3/uL — AB (ref 4.0–10.5)

## 2017-12-13 LAB — GLUCOSE, CAPILLARY: GLUCOSE-CAPILLARY: 288 mg/dL — AB (ref 70–99)

## 2017-12-13 LAB — BASIC METABOLIC PANEL
ANION GAP: 6 (ref 5–15)
BUN: 10 mg/dL (ref 6–20)
CO2: 31 mmol/L (ref 22–32)
Calcium: 9.4 mg/dL (ref 8.9–10.3)
Chloride: 102 mmol/L (ref 98–111)
Creatinine, Ser: 0.78 mg/dL (ref 0.61–1.24)
Glucose, Bld: 315 mg/dL — ABNORMAL HIGH (ref 70–99)
Potassium: 4.2 mmol/L (ref 3.5–5.1)
Sodium: 139 mmol/L (ref 135–145)

## 2017-12-13 LAB — HEMOGLOBIN A1C
Hgb A1c MFr Bld: 11 % — ABNORMAL HIGH (ref 4.8–5.6)
MEAN PLASMA GLUCOSE: 269 mg/dL

## 2017-12-13 NOTE — Patient Instructions (Signed)
Troy [name]  12/13/2017   Your procedure is scheduled on: 12-15-17   Report to [facility] Main  Entrance    Report to admitting at 9:30AM    Call this number if you have problems the morning of surgery [phone]     Remember: Do not eat food or drink liquids :After Midnight.     Take these medicines the morning of surgery with A SIP OF WATER: doxycycline, Atrovent nebulizer if needed, metoprolol, omeprazole, oxycodone if needed                                You may not have any metal on your body including hair pins and              piercings  Do not wear jewelry, make-up, lotions, powders or perfumes, deodorant                      Men may shave face and neck.   Do not bring valuables to the hospital. [facility].  Contacts, dentures or bridgework may not be worn into surgery.  Leave suitcase in the car. After surgery it may be brought to your room.                  Please read over the following fact sheets you were given: _____________________________________________________________________             How to Manage Your Diabetes Before and After Surgery  Why is it important to control my blood sugar before and after surgery? . Improving blood sugar levels before and after surgery helps healing and can limit problems. . A way of improving blood sugar control is eating a healthy diet by: o  Eating less sugar and carbohydrates o  Increasing activity/exercise o  Talking with your doctor about reaching your blood sugar goals . High blood sugars (greater than 180 mg/dL) can raise your risk of infections and slow your recovery, so you will need to focus on controlling your diabetes during the weeks before surgery. . Make sure that the doctor who takes care of your diabetes knows about your planned surgery including the date and location.  How do I manage my blood sugar before surgery? . Check  your blood sugar at least 4 times a day, starting 2 days before surgery, to make sure that the level is not too high or low. o Check your blood sugar the morning of your surgery when you wake up and every 2 hours until you get to the Short Stay unit. . If your blood sugar is less than 70 mg/dL, you will need to treat for low blood sugar: o Do not take insulin. o Treat a low blood sugar (less than 70 mg/dL) with  cup of clear juice (cranberry or apple), 4 glucose tablets, OR glucose gel. o Recheck blood sugar in 15 minutes after treatment (to make sure it is greater than 70 mg/dL). If your blood sugar is not greater than 70 mg/dL on recheck, call [phone] for further instructions. . Report your blood sugar to the short stay nurse when you get to Short Stay.  . If you are admitted to the hospital after surgery: o Your  blood sugar will be checked by the staff and you will probably be given insulin after surgery (instead of oral diabetes medicines) to make sure you have good blood sugar levels. o The goal for blood sugar control after surgery is 80-180 mg/dL.   WHAT DO I DO ABOUT MY DIABETES MEDICATION?        THE MORNING OF SURGERY, Do not take oral diabetes medicines (pills)! Do not take other diabetes injectables, including Byetta (exenatide), Bydureon (exenatide ER), Victoza (liraglutide), or Trulicity (dulaglutide).   Patient Signature:  Date:   Nurse Signature:  Date:   Reviewed and Endorsed by [facility] Patient Education Committee, August 2015   [facility] - Preparing for Surgery Before surgery, you can play an important role.  Because skin is not sterile, your skin needs to be as free of germs as possible.  You can reduce the number of germs on your skin by washing with CHG (chlorahexidine gluconate) soap before surgery.  CHG is an antiseptic cleaner which kills germs and bonds with the skin to continue killing germs even after washing. Please DO NOT use if you have an  allergy to CHG or antibacterial soaps.  If your skin becomes reddened/irritated stop using the CHG and inform your nurse when you arrive at Short Stay. Do not shave (including legs and underarms) for at least 48 hours prior to the first CHG shower.  You may shave your face/neck. Please follow these instructions carefully:  1.  Shower with CHG Soap the night before surgery and the  morning of Surgery.  2.  If you choose to wash your hair, wash your hair first as usual with your  normal  shampoo.  3.  After you shampoo, rinse your hair and body thoroughly to remove the  shampoo.                           4.  Use CHG as you would any other liquid soap.  You can apply chg directly  to the skin and wash                       Gently with a scrungie or clean washcloth.  5.  Apply the CHG Soap to your body ONLY FROM THE NECK DOWN.   Do not use on face/ open                           Wound or open sores. Avoid contact with eyes, ears mouth and genitals (private parts).                       Wash face,  Genitals (private parts) with your normal soap.             6.  Wash thoroughly, paying special attention to the area where your surgery  will be performed.  7.  Thoroughly rinse your body with warm water from the neck down.  8.  DO NOT shower/wash with your normal soap after using and rinsing off  the CHG Soap.                9.  Pat yourself dry with a clean towel.            10.  Wear clean pajamas.            11.  Place clean sheets on your  bed the night of your first shower and do not  sleep with pets. Day of Surgery : Do not apply any lotions/deodorants the morning of surgery.  Please wear clean clothes to the hospital/surgery center.  FAILURE TO FOLLOW THESE INSTRUCTIONS MAY RESULT IN THE CANCELLATION OF YOUR SURGERY PATIENT SIGNATURE_________________________________  NURSE SIGNATURE__________________________________  ________________________________________________________________________

## 2017-12-13 NOTE — Progress Notes (Signed)
lov cardiology Dr [name] 04-25-17 epic   Stress test 01-15-16 epic   Echo 12-16-15 epic

## 2017-12-13 NOTE — Progress Notes (Signed)
Need orders in epic.  Surgery on 6/27.  Preop on 6/25 at 200pm.

## 2017-12-14 NOTE — Progress Notes (Signed)
Bmp and HgbA1c routed via epic to dr Gerald Stabs blackman

## 2017-12-15 ENCOUNTER — Other Ambulatory Visit: Payer: Self-pay

## 2017-12-15 ENCOUNTER — Encounter (HOSPITAL_COMMUNITY): Payer: Self-pay | Admitting: *Deleted

## 2017-12-15 ENCOUNTER — Encounter (HOSPITAL_COMMUNITY)
Admission: RE | Disposition: A | Discharge status: Home / Self Care | Payer: Self-pay | Source: Ambulatory Visit | Attending: Orthopaedic Surgery

## 2017-12-15 ENCOUNTER — Ambulatory Visit (HOSPITAL_COMMUNITY): Payer: Medicare HMO | Admitting: Registered Nurse

## 2017-12-15 ENCOUNTER — Observation Stay (HOSPITAL_COMMUNITY)
Admission: RE | Admit: 2017-12-15 | Discharge: 2017-12-16 | Disposition: A | Discharge status: Home / Self Care | Payer: Medicare HMO | Source: Ambulatory Visit | Attending: Orthopaedic Surgery | Admitting: Orthopaedic Surgery

## 2017-12-15 DIAGNOSIS — L97424 Non-pressure chronic ulcer of left heel and midfoot with necrosis of bone: Secondary | ICD-10-CM | POA: Diagnosis not present

## 2017-12-15 DIAGNOSIS — I110 Hypertensive heart disease with heart failure: Secondary | ICD-10-CM | POA: Insufficient documentation

## 2017-12-15 DIAGNOSIS — Z89432 Acquired absence of left foot: Secondary | ICD-10-CM | POA: Insufficient documentation

## 2017-12-15 DIAGNOSIS — E11621 Type 2 diabetes mellitus with foot ulcer: Principal | ICD-10-CM | POA: Insufficient documentation

## 2017-12-15 DIAGNOSIS — Z7984 Long term (current) use of oral hypoglycemic drugs: Secondary | ICD-10-CM | POA: Insufficient documentation

## 2017-12-15 DIAGNOSIS — J449 Chronic obstructive pulmonary disease, unspecified: Secondary | ICD-10-CM | POA: Insufficient documentation

## 2017-12-15 DIAGNOSIS — Z9889 Other specified postprocedural states: Secondary | ICD-10-CM

## 2017-12-15 DIAGNOSIS — Z7982 Long term (current) use of aspirin: Secondary | ICD-10-CM | POA: Insufficient documentation

## 2017-12-15 DIAGNOSIS — Z8249 Family history of ischemic heart disease and other diseases of the circulatory system: Secondary | ICD-10-CM | POA: Insufficient documentation

## 2017-12-15 DIAGNOSIS — F329 Major depressive disorder, single episode, unspecified: Secondary | ICD-10-CM | POA: Insufficient documentation

## 2017-12-15 DIAGNOSIS — Z888 Allergy status to other drugs, medicaments and biological substances status: Secondary | ICD-10-CM | POA: Insufficient documentation

## 2017-12-15 DIAGNOSIS — E08621 Diabetes mellitus due to underlying condition with foot ulcer: Secondary | ICD-10-CM

## 2017-12-15 DIAGNOSIS — L97524 Non-pressure chronic ulcer of other part of left foot with necrosis of bone: Secondary | ICD-10-CM | POA: Diagnosis not present

## 2017-12-15 DIAGNOSIS — Z79899 Other long term (current) drug therapy: Secondary | ICD-10-CM | POA: Insufficient documentation

## 2017-12-15 DIAGNOSIS — Z885 Allergy status to narcotic agent status: Secondary | ICD-10-CM | POA: Insufficient documentation

## 2017-12-15 DIAGNOSIS — I5032 Chronic diastolic (congestive) heart failure: Secondary | ICD-10-CM | POA: Diagnosis not present

## 2017-12-15 DIAGNOSIS — L97529 Non-pressure chronic ulcer of other part of left foot with unspecified severity: Secondary | ICD-10-CM

## 2017-12-15 DIAGNOSIS — F17210 Nicotine dependence, cigarettes, uncomplicated: Secondary | ICD-10-CM | POA: Insufficient documentation

## 2017-12-15 DIAGNOSIS — F419 Anxiety disorder, unspecified: Secondary | ICD-10-CM | POA: Insufficient documentation

## 2017-12-15 DIAGNOSIS — E785 Hyperlipidemia, unspecified: Secondary | ICD-10-CM | POA: Insufficient documentation

## 2017-12-15 HISTORY — PX: I & D EXTREMITY: SHX5045

## 2017-12-15 HISTORY — DX: Other specified postprocedural states: Z98.890

## 2017-12-15 LAB — GLUCOSE, CAPILLARY
GLUCOSE-CAPILLARY: 151 mg/dL — AB (ref 70–99)
Glucose-Capillary: 133 mg/dL — ABNORMAL HIGH (ref 70–99)
Glucose-Capillary: 201 mg/dL — ABNORMAL HIGH (ref 70–99)
Glucose-Capillary: 187 mg/dL — ABNORMAL HIGH (ref 70–99)

## 2017-12-15 SURGERY — IRRIGATION AND DEBRIDEMENT EXTREMITY
Anesthesia: General | Laterality: Left

## 2017-12-15 MED ORDER — MIDAZOLAM HCL 2 MG/2ML IJ SOLN
INTRAMUSCULAR | Status: AC
  Filled 2017-12-15: qty 2

## 2017-12-15 MED ORDER — FENTANYL CITRATE (PF) 100 MCG/2ML IJ SOLN
INTRAMUSCULAR | Status: DC | PRN
  Administered 2017-12-15: 100 ug via INTRAVENOUS

## 2017-12-15 MED ORDER — ACETAMINOPHEN 10 MG/ML IV SOLN
INTRAVENOUS | Status: AC
  Filled 2017-12-15: qty 100

## 2017-12-15 MED ORDER — METFORMIN HCL ER 500 MG PO TB24
1000.0000 mg | ORAL_TABLET | Freq: Two times a day (BID) | ORAL | Status: DC
  Administered 2017-12-16: 1000 mg via ORAL
  Filled 2017-12-15: qty 2

## 2017-12-15 MED ORDER — ONDANSETRON HCL 4 MG/2ML IJ SOLN
INTRAMUSCULAR | Status: DC | PRN
  Administered 2017-12-15: 4 mg via INTRAVENOUS

## 2017-12-15 MED ORDER — HYDROMORPHONE HCL 1 MG/ML IJ SOLN: 0.5000 mg | INTRAMUSCULAR | Status: DC | PRN

## 2017-12-15 MED ORDER — KETOROLAC TROMETHAMINE 30 MG/ML IJ SOLN
INTRAMUSCULAR | Status: AC
  Filled 2017-12-15: qty 1

## 2017-12-15 MED ORDER — LIDOCAINE 2% (20 MG/ML) 5 ML SYRINGE
INTRAMUSCULAR | Status: DC | PRN
  Administered 2017-12-15: 100 mg via INTRAVENOUS

## 2017-12-15 MED ORDER — CHLORHEXIDINE GLUCONATE 4 % EX LIQD: 60.0000 mL | Freq: Once | CUTANEOUS | Status: DC

## 2017-12-15 MED ORDER — METOPROLOL SUCCINATE ER 50 MG PO TB24
100.0000 mg | ORAL_TABLET | Freq: Every day | ORAL | Status: DC
  Administered 2017-12-15: 100 mg via ORAL
  Filled 2017-12-15: qty 1

## 2017-12-15 MED ORDER — PROPOFOL 10 MG/ML IV BOLUS
INTRAVENOUS | Status: AC
  Filled 2017-12-15: qty 20

## 2017-12-15 MED ORDER — DIPHENHYDRAMINE HCL 12.5 MG/5ML PO ELIX: 12.5000 mg | ORAL_SOLUTION | ORAL | Status: DC | PRN

## 2017-12-15 MED ORDER — METOPROLOL SUCCINATE ER 50 MG PO TB24
50.0000 mg | ORAL_TABLET | Freq: Every day | ORAL | Status: DC
  Administered 2017-12-15: 50 mg via ORAL
  Filled 2017-12-15 (×2): qty 1

## 2017-12-15 MED ORDER — HYDROMORPHONE HCL 1 MG/ML IJ SOLN
0.2500 mg | INTRAMUSCULAR | Status: DC | PRN
  Administered 2017-12-15 (×4): 0.5 mg via INTRAVENOUS

## 2017-12-15 MED ORDER — TIZANIDINE HCL 4 MG PO TABS
12.0000 mg | ORAL_TABLET | Freq: Every day | ORAL | Status: DC
Start: 2017-12-15 — End: 2017-12-16
  Administered 2017-12-15: 12 mg via ORAL
  Filled 2017-12-15: qty 3

## 2017-12-15 MED ORDER — HYDROMORPHONE HCL 1 MG/ML IJ SOLN
INTRAMUSCULAR | Status: AC
  Filled 2017-12-15: qty 2

## 2017-12-15 MED ORDER — KETOROLAC TROMETHAMINE 30 MG/ML IJ SOLN
30.0000 mg | Freq: Once | INTRAMUSCULAR | Status: AC
  Administered 2017-12-15: 30 mg via INTRAVENOUS

## 2017-12-15 MED ORDER — INSULIN ASPART 100 UNIT/ML ~~LOC~~ SOLN
0.0000 [IU] | Freq: Three times a day (TID) | SUBCUTANEOUS | Status: DC
  Administered 2017-12-15: 5 [IU] via SUBCUTANEOUS
  Administered 2017-12-16: 8 [IU] via SUBCUTANEOUS

## 2017-12-15 MED ORDER — METOCLOPRAMIDE HCL 5 MG/ML IJ SOLN: 5.0000 mg | Freq: Three times a day (TID) | INTRAMUSCULAR | Status: DC | PRN

## 2017-12-15 MED ORDER — POLYETHYLENE GLYCOL 3350 17 G PO PACK: 17.0000 g | PACK | Freq: Every day | ORAL | Status: DC | PRN

## 2017-12-15 MED ORDER — METHOCARBAMOL 500 MG PO TABS
500.0000 mg | ORAL_TABLET | Freq: Four times a day (QID) | ORAL | Status: DC | PRN
  Administered 2017-12-16: 500 mg via ORAL
  Filled 2017-12-15: qty 1

## 2017-12-15 MED ORDER — OXYCODONE HCL 5 MG PO TABS
5.0000 mg | ORAL_TABLET | ORAL | Status: DC | PRN
  Administered 2017-12-15: 5 mg via ORAL
  Administered 2017-12-16: 10 mg via ORAL
  Filled 2017-12-15: qty 1
  Filled 2017-12-15: qty 2

## 2017-12-15 MED ORDER — FUROSEMIDE 20 MG PO TABS
20.0000 mg | ORAL_TABLET | Freq: Every day | ORAL | Status: DC
  Administered 2017-12-15 – 2017-12-16 (×2): 20 mg via ORAL
  Filled 2017-12-15 (×2): qty 1

## 2017-12-15 MED ORDER — METOPROLOL SUCCINATE ER 50 MG PO TB24
100.0000 mg | ORAL_TABLET | Freq: Every day | ORAL | Status: DC
  Administered 2017-12-16: 100 mg via ORAL
  Filled 2017-12-15: qty 2

## 2017-12-15 MED ORDER — CEFAZOLIN SODIUM-DEXTROSE 1-4 GM/50ML-% IV SOLN
1.0000 g | Freq: Four times a day (QID) | INTRAVENOUS | Status: AC
  Administered 2017-12-15 – 2017-12-16 (×3): 1 g via INTRAVENOUS
  Filled 2017-12-15 (×3): qty 50

## 2017-12-15 MED ORDER — CANAGLIFLOZIN 100 MG PO TABS
100.0000 mg | ORAL_TABLET | Freq: Every day | ORAL | Status: DC
  Administered 2017-12-16: 100 mg via ORAL
  Filled 2017-12-15: qty 1

## 2017-12-15 MED ORDER — FENTANYL CITRATE (PF) 100 MCG/2ML IJ SOLN
100.0000 ug | Freq: Once | INTRAMUSCULAR | Status: AC
  Administered 2017-12-15: 100 ug via INTRAVENOUS

## 2017-12-15 MED ORDER — ACETAMINOPHEN 325 MG PO TABS: 325.0000 mg | ORAL_TABLET | Freq: Four times a day (QID) | ORAL | Status: DC | PRN

## 2017-12-15 MED ORDER — ASPIRIN EC 81 MG PO TBEC
81.0000 mg | DELAYED_RELEASE_TABLET | Freq: Every day | ORAL | Status: DC
  Administered 2017-12-15 – 2017-12-16 (×2): 81 mg via ORAL
  Filled 2017-12-15 (×2): qty 1

## 2017-12-15 MED ORDER — MEPERIDINE HCL 50 MG/ML IJ SOLN: 6.2500 mg | INTRAMUSCULAR | Status: DC | PRN

## 2017-12-15 MED ORDER — FENTANYL CITRATE (PF) 100 MCG/2ML IJ SOLN
INTRAMUSCULAR | Status: AC
  Filled 2017-12-15: qty 2

## 2017-12-15 MED ORDER — CEFAZOLIN SODIUM-DEXTROSE 2-4 GM/100ML-% IV SOLN
2.0000 g | INTRAVENOUS | Status: AC
  Administered 2017-12-15: 2 g via INTRAVENOUS
  Filled 2017-12-15: qty 100

## 2017-12-15 MED ORDER — LOSARTAN POTASSIUM 50 MG PO TABS
50.0000 mg | ORAL_TABLET | Freq: Every day | ORAL | Status: DC
  Administered 2017-12-15 – 2017-12-16 (×2): 50 mg via ORAL
  Filled 2017-12-15 (×2): qty 1

## 2017-12-15 MED ORDER — SIMVASTATIN 20 MG PO TABS
40.0000 mg | ORAL_TABLET | Freq: Every evening | ORAL | Status: DC
  Administered 2017-12-15: 40 mg via ORAL
  Filled 2017-12-15: qty 2

## 2017-12-15 MED ORDER — 0.9 % SODIUM CHLORIDE (POUR BTL) OPTIME
TOPICAL | Status: DC | PRN
Start: 2017-12-15 — End: 2017-12-15
  Administered 2017-12-15: 1000 mL

## 2017-12-15 MED ORDER — ONDANSETRON HCL 4 MG/2ML IJ SOLN
INTRAMUSCULAR | Status: AC
  Filled 2017-12-15: qty 2

## 2017-12-15 MED ORDER — PROMETHAZINE HCL 25 MG/ML IJ SOLN: 6.2500 mg | INTRAMUSCULAR | Status: DC | PRN

## 2017-12-15 MED ORDER — AMITRIPTYLINE HCL 50 MG PO TABS
150.0000 mg | ORAL_TABLET | Freq: Every day | ORAL | Status: DC
  Administered 2017-12-15: 150 mg via ORAL
  Filled 2017-12-15: qty 1

## 2017-12-15 MED ORDER — GLIPIZIDE ER 5 MG PO TB24
10.0000 mg | ORAL_TABLET | Freq: Every day | ORAL | Status: DC
  Administered 2017-12-16: 10 mg via ORAL
  Filled 2017-12-15: qty 2

## 2017-12-15 MED ORDER — DEXTROSE 5 % IV SOLN
500.0000 mg | Freq: Four times a day (QID) | INTRAVENOUS | Status: DC | PRN
  Administered 2017-12-15: 500 mg via INTRAVENOUS
  Filled 2017-12-15: qty 550

## 2017-12-15 MED ORDER — ONDANSETRON HCL 4 MG/2ML IJ SOLN: 4.0000 mg | Freq: Four times a day (QID) | INTRAMUSCULAR | Status: DC | PRN

## 2017-12-15 MED ORDER — METOCLOPRAMIDE HCL 5 MG PO TABS: 5.0000 mg | ORAL_TABLET | Freq: Three times a day (TID) | ORAL | Status: DC | PRN

## 2017-12-15 MED ORDER — ACETAMINOPHEN 10 MG/ML IV SOLN
1000.0000 mg | Freq: Once | INTRAVENOUS | Status: DC | PRN
  Administered 2017-12-15: 1000 mg via INTRAVENOUS

## 2017-12-15 MED ORDER — IPRATROPIUM BROMIDE 0.02 % IN SOLN: 0.5000 mg | Freq: Four times a day (QID) | RESPIRATORY_TRACT | Status: DC | PRN

## 2017-12-15 MED ORDER — TRAZODONE HCL 50 MG PO TABS
50.0000 mg | ORAL_TABLET | Freq: Every day | ORAL | Status: DC
  Administered 2017-12-15: 50 mg via ORAL
  Filled 2017-12-15: qty 1

## 2017-12-15 MED ORDER — DOCUSATE SODIUM 100 MG PO CAPS
100.0000 mg | ORAL_CAPSULE | Freq: Two times a day (BID) | ORAL | Status: DC
  Administered 2017-12-15 – 2017-12-16 (×2): 100 mg via ORAL
  Filled 2017-12-15 (×2): qty 1

## 2017-12-15 MED ORDER — LORAZEPAM 1 MG PO TABS
1.0000 mg | ORAL_TABLET | Freq: Every day | ORAL | Status: DC
  Administered 2017-12-15: 1 mg via ORAL
  Filled 2017-12-15: qty 1

## 2017-12-15 MED ORDER — HYDROCODONE-ACETAMINOPHEN 7.5-325 MG PO TABS: 1.0000 | ORAL_TABLET | Freq: Once | ORAL | Status: DC | PRN

## 2017-12-15 MED ORDER — PROPOFOL 10 MG/ML IV BOLUS
INTRAVENOUS | Status: DC | PRN
  Administered 2017-12-15: 200 mg via INTRAVENOUS

## 2017-12-15 MED ORDER — MIDAZOLAM HCL 5 MG/5ML IJ SOLN
INTRAMUSCULAR | Status: DC | PRN
  Administered 2017-12-15: 2 mg via INTRAVENOUS

## 2017-12-15 MED ORDER — LIDOCAINE 2% (20 MG/ML) 5 ML SYRINGE
INTRAMUSCULAR | Status: AC
  Filled 2017-12-15: qty 5

## 2017-12-15 MED ORDER — INSULIN ASPART 100 UNIT/ML ~~LOC~~ SOLN: 0.0000 [IU] | Freq: Every day | SUBCUTANEOUS | Status: DC

## 2017-12-15 MED ORDER — SODIUM CHLORIDE 0.9 % IV SOLN
INTRAVENOUS | Status: DC
  Administered 2017-12-15: 16:00:00 via INTRAVENOUS

## 2017-12-15 MED ORDER — ONDANSETRON HCL 4 MG PO TABS: 4.0000 mg | ORAL_TABLET | Freq: Four times a day (QID) | ORAL | Status: DC | PRN

## 2017-12-15 MED ORDER — POTASSIUM CHLORIDE ER 8 MEQ PO TBCR: 8.0000 meq | EXTENDED_RELEASE_TABLET | Freq: Every day | ORAL | Status: DC

## 2017-12-15 MED ORDER — LACTATED RINGERS IV SOLN
INTRAVENOUS | Status: DC
  Administered 2017-12-15: 10:00:00 via INTRAVENOUS

## 2017-12-15 MED ORDER — OXYCODONE HCL 5 MG PO TABS
10.0000 mg | ORAL_TABLET | ORAL | Status: DC | PRN
  Administered 2017-12-15: 15 mg via ORAL
  Administered 2017-12-16: 10 mg via ORAL
  Filled 2017-12-15: qty 2
  Filled 2017-12-15: qty 3

## 2017-12-15 SURGICAL SUPPLY — 29 items
BANDAGE ESMARK 6X9 LF (GAUZE/BANDAGES/DRESSINGS) ×1 IMPLANT
BNDG CMPR 9X6 STRL LF SNTH (GAUZE/BANDAGES/DRESSINGS)
BNDG COHESIVE 4X5 TAN STRL (GAUZE/BANDAGES/DRESSINGS) ×2 IMPLANT
BNDG ESMARK 6X9 LF (GAUZE/BANDAGES/DRESSINGS)
BNDG GAUZE ELAST 4 BULKY (GAUZE/BANDAGES/DRESSINGS) ×3 IMPLANT
COVER SURGICAL LIGHT HANDLE (MISCELLANEOUS) ×3 IMPLANT
CUFF TOURN SGL QUICK 18 (TOURNIQUET CUFF) IMPLANT
CUFF TOURN SGL QUICK 24 (TOURNIQUET CUFF)
CUFF TOURN SGL QUICK 34 (TOURNIQUET CUFF)
CUFF TRNQT CYL 24X4X40X1 (TOURNIQUET CUFF) IMPLANT
CUFF TRNQT CYL 34X4X40X1 (TOURNIQUET CUFF) IMPLANT
DRAPE U-SHAPE 47X51 STRL (DRAPES) ×2 IMPLANT
DRSG PAD ABDOMINAL 8X10 ST (GAUZE/BANDAGES/DRESSINGS) ×4 IMPLANT
DURAPREP 26ML APPLICATOR (WOUND CARE) ×1 IMPLANT
ELECT REM PT RETURN 15FT ADLT (MISCELLANEOUS) ×3 IMPLANT
GAUZE SPONGE 4X4 12PLY STRL (GAUZE/BANDAGES/DRESSINGS) ×3 IMPLANT
GAUZE XEROFORM 1X8 LF (GAUZE/BANDAGES/DRESSINGS) ×2 IMPLANT
GLOVE BIO SURGEON STRL SZ7.5 (GLOVE) ×3 IMPLANT
GLOVE BIOGEL PI IND STRL 8 (GLOVE) ×1 IMPLANT
GLOVE BIOGEL PI INDICATOR 8 (GLOVE) ×2
GOWN STRL REUS W/TWL XL LVL3 (GOWN DISPOSABLE) ×3 IMPLANT
KIT BASIN OR (CUSTOM PROCEDURE TRAY) ×3 IMPLANT
PACK ORTHO EXTREMITY (CUSTOM PROCEDURE TRAY) ×3 IMPLANT
PAD ABD 8X10 STRL (GAUZE/BANDAGES/DRESSINGS) ×2 IMPLANT
PAD CAST 4YDX4 CTTN HI CHSV (CAST SUPPLIES) ×1 IMPLANT
PADDING CAST COTTON 4X4 STRL (CAST SUPPLIES)
SUT ETHILON 2 0 PS N (SUTURE) ×2 IMPLANT
SUT ETHILON 2 0 PSLX (SUTURE) ×6 IMPLANT
TOWEL OR 17X26 10 PK STRL BLUE (TOWEL DISPOSABLE) ×4 IMPLANT

## 2017-12-15 NOTE — Anesthesia Postprocedure Evaluation (Signed)
Anesthesia Post Note  Patient: Troy [name]  Procedure(s) Performed: IRRIGATION AND DEBRIDEMENT LEFT FOOT ULCER (Left )     Patient location during evaluation: PACU Anesthesia Type: General Level of consciousness: sedated Pain management: pain level controlled Vital Signs Assessment: post-procedure vital signs reviewed and stable Respiratory status: spontaneous breathing Cardiovascular status: stable Postop Assessment: no apparent nausea or vomiting Anesthetic complications: no    Last Vitals:  Vitals:   12/15/17 1444 12/15/17 1500  BP: 103/68 115/78  Pulse: 99 98  Resp: 10 14  Temp: 36.9 C 36.8 C  SpO2: 97% 99%    Last Pain:  Vitals:   12/15/17 1444  TempSrc:   PainSc: Asleep   Pain Goal:                 Kathy Wares JR,JOHN Jaeceon Michelin

## 2017-12-15 NOTE — Brief Op Note (Signed)
12/15/2017  12:54 PM  PATIENT:  Troy [name]  57 y.o. male  PRE-OPERATIVE DIAGNOSIS:  left foot ulcer  POST-OPERATIVE DIAGNOSIS:  left foot ulcer  PROCEDURE:  Procedure(s): IRRIGATION AND DEBRIDEMENT LEFT FOOT ULCER (Left)  SURGEON:  Surgeon(s) and Role:    Mcarthur Rossetti, MD - Primary  ANESTHESIA:   general  EBL:  25 mL   COUNTS:  YES  TOURNIQUET:  * No tourniquets in log *  DICTATION: .Other Dictation: Dictation Number [id number]  PLAN OF CARE: Admit for overnight observation  PATIENT DISPOSITION:  PACU - hemodynamically stable.   Delay start of Pharmacological VTE agent (>24hrs) due to surgical blood loss or risk of bleeding: not applicable

## 2017-12-15 NOTE — Anesthesia Procedure Notes (Signed)
Procedure Name: LMA Insertion Date/Time: 12/15/2017 12:06 PM Performed by: Victoriano Lain, CRNA Pre-anesthesia Checklist: Patient identified, Emergency Drugs available, Suction available, Patient being monitored and Timeout performed Patient Re-evaluated:Patient Re-evaluated prior to induction Oxygen Delivery Method: Circle system utilized Preoxygenation: Pre-oxygenation with 100% oxygen Induction Type: IV induction LMA: LMA with gastric port inserted LMA Size: 4.0 Number of attempts: 1 Placement Confirmation: positive ETCO2 and breath sounds checked- equal and bilateral Tube secured with: Tape Dental Injury: Teeth and Oropharynx as per pre-operative assessment

## 2017-12-15 NOTE — Anesthesia Preprocedure Evaluation (Addendum)
Anesthesia Evaluation  Patient identified by MRN, date of birth, ID band Patient awake    Reviewed: Allergy & Precautions, NPO status , Patient's Chart, lab work & pertinent test results  Airway Mallampati: II  TM Distance: >3 FB Neck ROM: Full    Dental no notable dental hx. (+) Teeth Intact, Caps   Pulmonary neg pulmonary ROS, Current Smoker,    Pulmonary exam normal breath sounds clear to auscultation       Cardiovascular hypertension, Pt. on medications negative cardio ROS Normal cardiovascular exam Rhythm:Regular Rate:Normal  07/2015 Cath  There is hyperdynamic overall left ventricular systolic function with apical dyskinesis.  LV end diastolic pressure is severely elevated.  The left ventricular ejection fraction is greater than 65% by visual estimate.  No significant CAD     Neuro/Psych Anxiety    GI/Hepatic negative GI ROS, Neg liver ROS,   Endo/Other  diabetes, Type 1  Renal/GU   negative genitourinary   Musculoskeletal   Abdominal   Peds  Hematology   Anesthesia Other Findings   Reproductive/Obstetrics                            Anesthesia Physical Anesthesia Plan  ASA: III  Anesthesia Plan: General   Post-op Pain Management:    Induction: Intravenous  PONV Risk Score and Plan: 0 and 1 and Treatment may vary due to age or medical condition, Ondansetron and Dexamethasone  Airway Management Planned: LMA  Additional Equipment:   Intra-op Plan:   Post-operative Plan:   Informed Consent: I have reviewed the patients History and Physical, chart, labs and discussed the procedure including the risks, benefits and alternatives for the proposed anesthesia with the patient or authorized representative who has indicated his/her understanding and acceptance.   Dental advisory given  Plan Discussed with: CRNA  Anesthesia Plan Comments:        Anesthesia Quick  Evaluation

## 2017-12-15 NOTE — H&P (Signed)
Troy [name] is an 57 y.o. male.   Chief Complaint:   Left foot ulcer HPI:   57 yo diabetic male with poor diabetic compliance who has developed a chronic ulcer on his left foot distal and plantar.  Was had some foul smelling drainage.  He has a history of a left midfoot amputation remotely due to a crush foot injury.  He also has struggled with compliance with his diabetic meds.  Past Medical History:  Diagnosis Date  . Acute renal failure ([place])    in setting of NSAID use and orthopedic surgery 2010  . Anxiety and depression   . Chronic diastolic CHF (congestive heart failure) ([place])    a. Echo 6/17: severe conc LVH, vigorous EF, EF 65-70%, no dynamic obstruction, no RWMA, Gr 1 DD, mild TR  //  b. LHC 8/17: no sig CAD, LVEDP 28  . COPD (chronic obstructive pulmonary disease) ([place])   . Diabetic ulcer of left foot ([place])   . DM2 (diabetes mellitus, type 2) ([place])   . Family history of early CAD   . History of amputation of foot ([place])    L trans-met // R toe  . History of cardiac catheterization    a. [place] 2002: irregs  //  b. LHC in 8/17: no sig CAD, apical DK, hyperdynamic LV, LVEDP 28  . History of nuclear stress test    a. Nuc 7/17: Overall, intermediate risk nuclear stress test secondary to small size of apical lateral defect and reduced ejection fraction.  EF 43%  . HLD (hyperlipidemia)   . HTN (hypertension)   . Injuries     crushing injury to both his feet in February 2010.   Marland Kitchen Kidney calculi   . Palpitations   . Tobacco abuse     Past Surgical History:  Procedure Laterality Date  . CARDIAC CATHETERIZATION N/A 01/22/2016   Procedure: Left Heart Cath and Coronary Angiography;  Surgeon: Peter M Martinique, MD;  Location: [facility];  Service: Cardiovascular;  Laterality: N/A;  . FOOT AMPUTATION Bilateral   . LITHOTRIPSY    . TENDON LENGTHENING Bilateral    calf  . TONSILLECTOMY      Family History  Problem Relation Age of Onset  . Leukemia Mother 34       died  . Lung  cancer Father 14       died  . Heart attack Brother 12  . Heart attack Brother 72  . Hypertension Brother        X3  . Hypertension Sister        X69  . Diabetes Sister   . Stroke Sister   . Diabetes Sister   . Other Brother        Musician accident   Social History:  reports that he has been smoking cigarettes.  He has a 20.00 pack-year smoking history. He has never used smokeless tobacco. He reports that he does not drink alcohol or use drugs.  Allergies:  Allergies  Allergen Reactions  . Hydromorphone Hcl Er Anaphylaxis  . Nucynta [Tapentadol Hydrochloride] Other (See Comments)    Throat swells  . Exalamide Other (See Comments)    Unknown  . Tapentadol Other (See Comments)    Throat swells Angiodema    Medications Prior to Admission  Medication Sig Dispense Refill  . amitriptyline (ELAVIL) 150 MG tablet Take 150 mg by mouth at bedtime.      Marland Kitchen aspirin EC 81 MG tablet Take 1 tablet (81  mg total) by mouth daily. 90 tablet 3  . dapagliflozin propanediol (FARXIGA) 10 MG TABS tablet Take 10 mg by mouth daily.    Marland Kitchen doxycycline (VIBRA-TABS) 100 MG tablet Take 1 tablet (100 mg total) by mouth 2 (two) times daily. 30 tablet 0  . Dulaglutide 1.5 MG/0.5ML SOPN Inject 1.5 mg into the skin every Wednesday.     . furosemide (LASIX) 80 MG tablet Take 0.5 tablets (40 mg total) by mouth daily. (Patient taking differently: Take 20 mg by mouth daily. ) 15 tablet 6  . glipiZIDE (GLUCOTROL XL) 10 MG 24 hr tablet Take 10 mg by mouth daily with breakfast.    . ipratropium (ATROVENT) 0.02 % nebulizer solution Take 2.5 mLs (0.5 mg total) by nebulization 4 (four) times daily. (Patient taking differently: Take 0.5 mg by nebulization 4 (four) times daily as needed for wheezing or shortness of breath. ) 75 mL 12  . LORazepam (ATIVAN) 1 MG tablet Take 1 mg by mouth at bedtime.     Marland Kitchen losartan (COZAAR) 50 MG tablet TAKE 1 TABLET (50 MG TOTAL) BY MOUTH DAILY. 90 tablet 3  . metFORMIN (GLUCOPHAGE-XR) 500 MG 24 hr  tablet Take 1,000 mg by mouth 2 (two) times daily.     . metoprolol succinate (TOPROL-XL) 100 MG 24 hr tablet Take 100 mg every AM and 50 mg every PM. Take with or immediately following a meal. (Patient taking differently: Take 50-100 mg by mouth See admin instructions. Take 100 mg every AM and 50 mg every PM. Take with or immediately following a meal.) 135 tablet 3  . omeprazole (PRILOSEC) 40 MG capsule Take 40 mg by mouth daily.    Marland Kitchen oxyCODONE-acetaminophen (PERCOCET) 10-325 MG per tablet Take 1 tablet by mouth 5 (five) times daily.     . potassium chloride (KLOR-CON) 8 MEQ tablet Take 8 mEq by mouth daily.    . simvastatin (ZOCOR) 40 MG tablet Take 40 mg by mouth every evening.     Marland Kitchen tiZANidine (ZANAFLEX) 4 MG capsule Take 12 mg by mouth at bedtime.     . traZODone (DESYREL) 50 MG tablet Take 50 mg by mouth at bedtime.     Marland Kitchen albuterol (PROVENTIL HFA;VENTOLIN HFA) 108 (90 Base) MCG/ACT inhaler Inhale 2 puffs into the lungs every 6 (six) hours as needed for wheezing or shortness of breath. (Patient not taking: Reported on 12/12/2017) 1 Inhaler 6    Results for orders placed or performed during the hospital encounter of 12/15/17 (from the past 48 hour(s))  Glucose, capillary     Status: Abnormal   Collection Time: 12/15/17  9:39 AM  Result Value Ref Range   Glucose-Capillary 151 (H) 70 - 99 mg/dL   No results found.  Review of Systems  All other systems reviewed and are negative.   Blood pressure 136/83, pulse (!) 110, temperature 98.5 F (36.9 C), temperature source Oral, resp. rate 18, height _0  (1.803 m), weight 220 lb (99.8 kg), SpO2 99 %. Physical Exam  Constitutional: He is oriented to person, place, and time. He appears well-developed and well-nourished.  HENT:  Head: Normocephalic and atraumatic.  Eyes: Pupils are equal, round, and reactive to light. EOM are normal.  Neck: Normal range of motion. Neck supple.  Cardiovascular: Normal rate and regular rhythm.  Respiratory:  Effort normal and breath sounds normal.  GI: Soft. Bowel sounds are normal.  Musculoskeletal:       Feet:  Neurological: He is alert and oriented to person, place,  and time.  Skin: Skin is warm and dry.  Psychiatric: He has a normal mood and affect.     Assessment/Plan Left foot with diabetic ulcer and history of a traumatic midfoot amputation  To the OR today for irrigation/debridement of the ulcer on his left foot.  Then will admit overnight for antibiotics and blood glucose control.  Mcarthur Rossetti, MD 12/15/2017, 11:54 AM

## 2017-12-15 NOTE — Op Note (Signed)
NAMEVIRAL, SCHRAMM MEDICAL RECORD [id number] ACCOUNT [id number] DATE OF BIRTH:09/19/1960 FACILITY: [facility] LOCATION: [facility] PHYSICIAN:Nephi Savage Kerry Fort, MD  OPERATIVE REPORT  DATE OF PROCEDURE:  12/15/2017  PREOPERATIVE DIAGNOSIS:  Left foot diabetic ulcer and a history of poorly controlled diabetes as well as a previous remote left mid foot traumatic amputation.  POSTOPERATIVE DIAGNOSIS:  Left foot diabetic ulcer and a history of poorly controlled diabetes as well as a previous remote left mid foot traumatic amputation.  PROCEDURE:  Sharp irrigation and debridement of left diabetic foot ulcer including sharp excisional debridement of necrotic skin, fascia and bone over a 5-7 cm area.  FINDINGS:  No evidence of osteomyelitis.  SURGEON:  Lind Guest. Ninfa Linden, MD  ANESTHESIA:  General.  ESTIMATED BLOOD LOSS:  Less than 50 mL.  COMPLICATIONS:  None.  ANTIBIOTICS:  2 grams IV Ancef.  INDICATIONS:  The patient is a 57 year old gentleman who almost 10 years ago had a crushing injury to his left foot.  He ended up with a mid-foot amputation.  He has done well for a long period of time, but is a diabetic and has not the best compliance.   He developed a wound over his foot several months ago and tried to treat this on his own.  We saw him in the office and it did have a malodorous smell, but no evidence on plain films of osteomyelitis.  There is necrosis of the hand.  I recommended he  undergo a thorough irrigation and debridement of this area.  Counseled about diabetic compliance as well.  DESCRIPTION OF PROCEDURE:  After informed consent was obtained verbally, the left foot was marked.  He was brought to the operating room and placed supine on the operating table.  General anesthesia was then obtained via an LMA.  His left foot was then  prepped and draped with Betadine scrub and paint.  A time-out was called and he was identified as the correct patient, and correct left foot.   I then used a #10 blade to sharply excise necrotic skin and fascia over the ulcer area to remove it in its  entirety.  I then used a rongeur to excise bone in this area to back up the mid foot bone to allow for closure of the wound.  He had good bleeding tissue and I irrigated this out with normal saline solution.  I was then able to reapproximate the skin  over a tenuous closure at the end of his foot.  This was done with 2-0 nylon suture.  Xeroform well-padded sterile dressing was applied.  He was taken to recovery room in stable condition.  All final counts were correct.  No complications noted.  Again,  postoperatively diabetic control will be important.  I will admit him for overnight observation for his diabetes and antibiotics with the plan of discharging him home tomorrow.  TN/NUANCE  D:12/15/2017 T:12/15/2017 JOB:[id number]

## 2017-12-15 NOTE — Transfer of Care (Signed)
Immediate Anesthesia Transfer of Care Note  Patient: Troy [name]  Procedure(s) Performed: IRRIGATION AND DEBRIDEMENT LEFT FOOT ULCER (Left )  Patient Location: PACU  Anesthesia Type:General  Level of Consciousness: awake, alert , oriented and patient cooperative  Airway & Oxygen Therapy: Patient Spontanous Breathing and Patient connected to face mask oxygen  Post-op Assessment: Report given to RN, Post -op Vital signs reviewed and stable and Patient moving all extremities  Post vital signs: Reviewed and stable  Last Vitals:  Vitals Value Taken Time  BP 136/83 12/15/2017 12:57 PM  Temp    Pulse 106 12/15/2017 12:59 PM  Resp 20 12/15/2017 12:59 PM  SpO2 100 % 12/15/2017 12:59 PM  Vitals shown include unvalidated device data.  Last Pain:  Vitals:   12/15/17 0958  TempSrc:   PainSc: 0-No pain         Complications: No apparent anesthesia complications

## 2017-12-16 ENCOUNTER — Encounter (HOSPITAL_COMMUNITY): Payer: Self-pay | Admitting: Orthopaedic Surgery

## 2017-12-16 DIAGNOSIS — E11621 Type 2 diabetes mellitus with foot ulcer: Secondary | ICD-10-CM | POA: Diagnosis not present

## 2017-12-16 LAB — GLUCOSE, CAPILLARY: GLUCOSE-CAPILLARY: 262 mg/dL — AB (ref 70–99)

## 2017-12-16 NOTE — Discharge Summary (Signed)
Patient ID: Troy [name] MRN: [id number] DOB/AGE: 22-Dec-1960 57 y.o.  Admit date: 12/15/2017 Discharge date: 12/16/2017  Admission Diagnoses:  Principal Problem:   Diabetic ulcer of left foot associated with diabetes mellitus due to underlying condition [facility]) Active Problems:   Status post left foot surgery   Discharge Diagnoses:  Same  Past Medical History:  Diagnosis Date  . Acute renal failure ([place])    in setting of NSAID use and orthopedic surgery 2010  . Anxiety and depression   . Chronic diastolic CHF (congestive heart failure) ([place])    a. Echo 6/17: severe conc LVH, vigorous EF, EF 65-70%, no dynamic obstruction, no RWMA, Gr 1 DD, mild TR  //  b. LHC 8/17: no sig CAD, LVEDP 28  . COPD (chronic obstructive pulmonary disease) ([place])   . Diabetic ulcer of left foot ([place])   . DM2 (diabetes mellitus, type 2) ([place])   . Family history of early CAD   . History of amputation of foot ([place])    L trans-met // R toe  . History of cardiac catheterization    a. [place] 2002: irregs  //  b. LHC in 8/17: no sig CAD, apical DK, hyperdynamic LV, LVEDP 28  . History of nuclear stress test    a. Nuc 7/17: Overall, intermediate risk nuclear stress test secondary to small size of apical lateral defect and reduced ejection fraction.  EF 43%  . HLD (hyperlipidemia)   . HTN (hypertension)   . Injuries     crushing injury to both his feet in February 2010.   Marland Kitchen Kidney calculi   . Palpitations   . Tobacco abuse     Surgeries: Procedure(s): IRRIGATION AND DEBRIDEMENT LEFT FOOT ULCER on 12/15/2017   Consultants:   Discharged Condition: Improved  Hospital Course: MARCUS SCHWANDT is an 57 y.o. male who was admitted 12/15/2017 for operative treatment ofDiabetic ulcer of left foot associated with diabetes mellitus due to underlying condition ([place]). Patient has severe unremitting pain that affects sleep, daily activities, and work/hobbies. After pre-op clearance the patient was taken to the operating  room on 12/15/2017 and underwent  Procedure(s): IRRIGATION AND DEBRIDEMENT LEFT FOOT ULCER.    Patient was given perioperative antibiotics:  Anti-infectives (From admission, onward)   Start     Dose/Rate Route Frequency Ordered Stop   12/15/17 1800  ceFAZolin (ANCEF) IVPB 1 g/50 mL premix     1 g 100 mL/hr over 30 Minutes Intravenous Every 6 hours 12/15/17 1602 12/16/17 0552   12/15/17 0945  ceFAZolin (ANCEF) IVPB 2g/100 mL premix     2 g 200 mL/hr over 30 Minutes Intravenous On call to O.R. 12/15/17 7124 12/15/17 1238       Patient was given sequential compression devices, early ambulation, and chemoprophylaxis to prevent DVT.  Patient benefited maximally from hospital stay and there were no complications.    Recent vital signs:  Patient Vitals for the past 24 hrs:  BP Temp Temp src Pulse Resp SpO2 Height Weight  12/16/17 0802 110/72 - - 78 - - - -  12/16/17 0432 105/70 98.6 F (37 C) Oral 79 16 95 % - -  12/16/17 0125 119/76 98.5 F (36.9 C) Oral 83 16 94 % - -  12/15/17 2122 116/73 98.2 F (36.8 C) Oral 78 16 97 % - -  12/15/17 1800 130/86 - - 89 16 96 % - -  12/15/17 1710 117/78 97.7 F (36.5 C) Oral 86 14 99 % - -  12/15/17  1608 123/78 (!) 97.5 F (36.4 C) Oral 99 - - - -  12/15/17 1500 115/78 98.3 F (36.8 C) - 98 14 99 % - -  12/15/17 1444 103/68 98.4 F (36.9 C) - 99 10 97 % - -  12/15/17 1430 107/75 - - 95 (!) 9 94 % - -  12/15/17 1415 112/70 - - 96 16 99 % - -  12/15/17 1400 108/72 - - 93 14 99 % - -  12/15/17 1345 115/74 - - 96 15 99 % - -  12/15/17 1330 122/75 - - 94 13 100 % - -  12/15/17 1315 115/74 - - 96 16 98 % - -  12/15/17 1300 127/82 - - (!) 106 20 100 % - -  12/15/17 1257 136/83 98.7 F (37.1 C) - (!) 109 13 100 % - -  12/15/17 1002 - - - - - - 5' 11"  (1.803 m) 220 lb (99.8 kg)  12/15/17 0936 136/83 98.5 F (36.9 C) Oral (!) 110 18 99 % - -     Recent laboratory studies:  Recent Labs    12/13/17 1454  WBC 10.8*  HGB 14.9  HCT 45.1  PLT  164  NA 139  K 4.2  CL 102  CO2 31  BUN 10  CREATININE 0.78  GLUCOSE 315*  CALCIUM 9.4     Discharge Medications:   Allergies as of 12/16/2017      Reactions   Hydromorphone Hcl Er Anaphylaxis   Allergic to DYE in extended-release tablet, can tolerate other forms of hydromorphone   Nucynta [tapentadol Hydrochloride] Other (See Comments)   Throat swells   Exalamide Other (See Comments)   Unknown   Tapentadol Other (See Comments)   Throat swells Angiodema      Medication List    TAKE these medications   albuterol 108 (90 Base) MCG/ACT inhaler Commonly known as:  PROVENTIL HFA;VENTOLIN HFA Inhale 2 puffs into the lungs every 6 (six) hours as needed for wheezing or shortness of breath.   amitriptyline 150 MG tablet Commonly known as:  ELAVIL Take 150 mg by mouth at bedtime.   aspirin EC 81 MG tablet Take 1 tablet (81 mg total) by mouth daily.   doxycycline 100 MG tablet Commonly known as:  VIBRA-TABS Take 1 tablet (100 mg total) by mouth 2 (two) times daily.   Dulaglutide 1.5 MG/0.5ML Sopn Inject 1.5 mg into the skin every Wednesday.   FARXIGA 10 MG Tabs tablet Generic drug:  dapagliflozin propanediol Take 10 mg by mouth daily.   furosemide 80 MG tablet Commonly known as:  LASIX Take 0.5 tablets (40 mg total) by mouth daily. What changed:  how much to take   glipiZIDE 10 MG 24 hr tablet Commonly known as:  GLUCOTROL XL Take 10 mg by mouth daily with breakfast.   ipratropium 0.02 % nebulizer solution Commonly known as:  ATROVENT Take 2.5 mLs (0.5 mg total) by nebulization 4 (four) times daily. What changed:    when to take this  reasons to take this   LORazepam 1 MG tablet Commonly known as:  ATIVAN Take 1 mg by mouth at bedtime.   losartan 50 MG tablet Commonly known as:  COZAAR TAKE 1 TABLET (50 MG TOTAL) BY MOUTH DAILY.   metFORMIN 500 MG 24 hr tablet Commonly known as:  GLUCOPHAGE-XR Take 1,000 mg by mouth 2 (two) times daily.    metoprolol succinate 100 MG 24 hr tablet Commonly known as:  TOPROL-XL Take 100 mg  every AM and 50 mg every PM. Take with or immediately following a meal. What changed:    how much to take  how to take this  when to take this  additional instructions   omeprazole 40 MG capsule Commonly known as:  PRILOSEC Take 40 mg by mouth daily.   oxyCODONE-acetaminophen 10-325 MG tablet Commonly known as:  PERCOCET Take 1 tablet by mouth 5 (five) times daily.   potassium chloride 8 MEQ tablet Commonly known as:  KLOR-CON Take 8 mEq by mouth daily.   simvastatin 40 MG tablet Commonly known as:  ZOCOR Take 40 mg by mouth every evening.   tiZANidine 4 MG capsule Commonly known as:  ZANAFLEX Take 12 mg by mouth at bedtime.   traZODone 50 MG tablet Commonly known as:  DESYREL Take 50 mg by mouth at bedtime.       Diagnostic Studies: Xr Foot 2 Views Left  Result Date: 11/23/2017 2 views of the left foot show previous midfoot amputation.  There is no evidence on plain film of osteomyelitis.   Disposition: Discharge disposition: 01-Home or Self Care       Discharge Instructions    Discharge patient   Complete by:  As directed    Discharge disposition:  01-Home or Self Care   Discharge patient date:  12/16/2017      Follow-up Information    Mcarthur Rossetti, MD. Schedule an appointment as soon as possible for a visit in 10 day(s).   Specialty:  Orthopedic Surgery Contact information: [place] [address] [phone]            Signed: Mcarthur Rossetti 12/16/2017, 8:54 AM

## 2017-12-16 NOTE — Progress Notes (Signed)
Provided discharge instructions to patient and spouse.  Both verbalized understanding.  All questions/concerns addressed.  Patient discharging to home with all belongings.

## 2017-12-16 NOTE — Progress Notes (Signed)
Patient ID: Troy [name], male   DOB: 1960/10/23, 57 y.o.   MRN: [id number] Doing well.  Can go home today.  Dressing clean and dry.

## 2017-12-28 ENCOUNTER — Encounter (INDEPENDENT_AMBULATORY_CARE_PROVIDER_SITE_OTHER): Payer: Self-pay | Admitting: Orthopaedic Surgery

## 2017-12-28 ENCOUNTER — Ambulatory Visit (INDEPENDENT_AMBULATORY_CARE_PROVIDER_SITE_OTHER): Payer: Medicare HMO | Admitting: Orthopaedic Surgery

## 2017-12-28 DIAGNOSIS — L97521 Non-pressure chronic ulcer of other part of left foot limited to breakdown of skin: Secondary | ICD-10-CM

## 2017-12-28 DIAGNOSIS — E08621 Diabetes mellitus due to underlying condition with foot ulcer: Secondary | ICD-10-CM

## 2017-12-28 MED ORDER — DOXYCYCLINE HYCLATE 100 MG PO TABS: 100.0000 mg | ORAL_TABLET | Freq: Two times a day (BID) | ORAL | 0 refills | Status: DC

## 2017-12-28 NOTE — Progress Notes (Signed)
The patient has a history of a left foot amputation through most of the metatarsals up to the midfoot.  We recently taken to the operating room due to her chronic foot wound and an ulcer.  At the back of the bone some more and get this closed.  Unfortunately his left foot wound is reopened and has a foul odor.  At this point I feel that he needs most likely a below-knee amputation.  He would not consent to this just yet so I told him that we could at least try a mid to hindfoot amputation for soft tissue coverage mainly.  He is a diabetic that has had poor control of his blood glucose levels.  He understands that the next surgery is a good chance of not healing and that he would need a below-knee amputation at a later date.  I will continue his doxycycline and have him soak his foot daily and antibacterial soapy water.  We will set him up for surgery next week for almost a hindfoot amputation based on what you are saying.  All question concerns were answered and addressed.

## 2017-12-30 ENCOUNTER — Other Ambulatory Visit (INDEPENDENT_AMBULATORY_CARE_PROVIDER_SITE_OTHER): Payer: Self-pay | Admitting: Orthopaedic Surgery

## 2017-12-30 DIAGNOSIS — E08621 Diabetes mellitus due to underlying condition with foot ulcer: Secondary | ICD-10-CM

## 2017-12-30 DIAGNOSIS — L97521 Non-pressure chronic ulcer of other part of left foot limited to breakdown of skin: Principal | ICD-10-CM

## 2017-12-31 ENCOUNTER — Encounter (HOSPITAL_COMMUNITY): Payer: Self-pay | Admitting: *Deleted

## 2017-12-31 NOTE — Progress Notes (Signed)
Spoke with wife for preop call. States patient has not had any chest pain or shortness of breath. Last cardiology visit in chart. All cardiac test done at [facility]. Below instructions given regarding management of DM.      How do I manage my blood sugar before surgery? . Check your blood sugar at least 4 times a day, starting 2 days before surgery, to make sure that the level is not too high or low. o Check your blood sugar the morning of your surgery when you wake up and every 2 hours until you get to the Short Stay unit. . If your blood sugar is less than 70 mg/dL, you will need to treat for low blood sugar: o Do not take insulin. o Treat a low blood sugar (less than 70 mg/dL) with  cup of clear juice (cranberry or apple), 4 glucose tablets, OR glucose gel. Recheck blood sugar in 15 minutes after treatment (to make sure it is greater than 70 mg/dL). If your blood sugar is not greater than 70 mg/dL on recheck, call [phone] o  for further instructions. . Report your blood sugar to the short stay nurse when you get to Short Stay.  . If you are admitted to the hospital after surgery: o Your blood sugar will be checked by the staff and you will probably be given insulin after surgery (instead of oral diabetes medicines) to make sure you have good blood sugar levels. o The goal for blood sugar control after surgery is 80-180 mg/dL.              WHAT DO I DO ABOUT MY DIABETES MEDICATION?   Marland Kitchen Do not take oral diabetes medicines (pills) the morning of surgery.

## 2018-01-02 MED ORDER — CEFAZOLIN SODIUM-DEXTROSE 2-4 GM/100ML-% IV SOLN
2.0000 g | INTRAVENOUS | Status: AC
  Administered 2018-01-03: 2 g via INTRAVENOUS
  Filled 2018-01-02: qty 100

## 2018-01-02 NOTE — Progress Notes (Signed)
Anesthesia Chart Review: Same day workup  Case:  [id number] Date/Time:  01/03/18 1400   Procedure:  LEFT MIDFOOT AMPUTATION (Left )   Anesthesia type:  General   Pre-op diagnosis:  left foot chronic wound breakdown   Location:  [facility] OR ROOM 07 / [place] OR   Surgeon:  Mcarthur Rossetti, MD      DISCUSSION: Pt is a 57 yo male current smoker scheduled for above procedure. 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  Pt recently had I&D of left foot ulcer by Dr. Ninfa Linden on 12/16/2017. The wound has reopened and Dr. Ninfa Linden recommended BKA. Pt would not consent to that and it was decided to proceed with hindfoot amputation.  Pt with long history of poor BS control, last A1c 11.0.  Anticipate he can proceed with surgery as planned barring any acute status change.  VS: There were no vitals taken for this visit.  PROVIDERSDione Housekeeper, MD is PCP last seen 12/26/2017 - noted at that time he was not taking abx as prescribed by Dr. Ninfa Linden s/p I&D. Also noted that he missed his appointment with endocrinology.  Francetta Found, MD is Endocrinologist last seen 07/08/2017  Baltazar Apo, MD is pulmonologist last seen 01/02/2018   LABS: Significantly elevated BG and A1c in setting of long term poor diabetic control  Lab Results  Component Value Date   WBC 10.8 (H) 12/13/2017   HGB 14.9 12/13/2017   HCT 45.1 12/13/2017   PLT 164 12/13/2017   GLUCOSE 315 (H) 12/13/2017   CHOL 124 08/27/2016   TRIG 168 (H) 08/27/2016   HDL 33 (L) 08/27/2016   LDLCALC 57 08/27/2016   ALT 30 05/09/2011   AST 27 05/09/2011   NA 139 12/13/2017   K 4.2 12/13/2017   CL 102 12/13/2017   CREATININE 0.78 12/13/2017   BUN 10 12/13/2017   CO2 31 12/13/2017   INR 1.13 01/22/2016   HGBA1C 11.0 (H) 12/13/2017    IMAGES: CHEST  2 VIEW 05/12/2016  COMPARISON:   05/09/2011.  FINDINGS: Mediastinum and hilar structures are normal. Metallic fragments again noted over the left upper chest. Heart size stable. No focal infiltrate. No pleural effusion or pneumothorax. 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  IMPRESSION: Stable chest.  No acute cardiopulmonary disease.   EKG: 12/13/2017: Normal sinus rhythm. Cannot rule out Inferior infarct , age undetermined. T wave abnormality, consider anterolateral ischemia. Abnormal ECG. No significant change since last tracing. Confirmed by Candee Furbish [phone]) on 12/13/2017 4:20:55 PM  CV: Holter 24hr 02/17/2017 Sinus rhythm Average HR OK on current meds No significant arrhythmias detected Keep on same meds  Cardiac Cath 01/22/2016:  There is hyperdynamic overall left ventricular systolic function with apical dyskinesis.  LV end diastolic pressure is severely elevated.  The left ventricular ejection fraction is greater than 65% by visual estimate.  No significant CAD   1. No significant CAD 2. Apical dyskinesis with otherwise hyperdynamic LV contractility. Etiology of apical wall motion abnormality is unclear. 3. Elevated LV EDP of 28 mm Hg.   Nuclear Stress 01/15/2016:  Nuclear stress EF: 43%. There is mid inferior wall hypokinesis.  There was no ST segment deviation noted during stress.  Defect 1: There is a small defect of mild severity present in the apical lateral and apex location. This defect is reversible and a small degree of ischemia cannot be excluded.  Overall, intermediate risk nuclear stress test  secondary to small size of apical lateral defect and reduced ejection fraction.  ECHO 12/16/2015: Study Conclusions  - Left ventricle: The cavity size was normal. There was severe   concentric hypertrophy. Systolic function was vigorous. The   estimated ejection fraction was in the range of 65% to 70%. There   was no dynamic obstruction. Wall motion was normal; there were no   regional  wall motion abnormalities. Doppler parameters are   consistent with abnormal left ventricular relaxation (grade 1   diastolic dysfunction). Doppler parameters are consistent with   high ventricular filling pressure. - Mitral valve: Calcified annulus. - Tricuspid valve: There was mild regurgitation.  Impressions:  - No change when compared to prior.  Past Medical History:  Diagnosis Date  . Acute renal failure ([place])    in setting of NSAID use and orthopedic surgery 2010  . Anxiety and depression   . Chronic diastolic CHF (congestive heart failure) ([place])    a. Echo 6/17: severe conc LVH, vigorous EF, EF 65-70%, no dynamic obstruction, no RWMA, Gr 1 DD, mild TR  //  b. LHC 8/17: no sig CAD, LVEDP 28  . COPD (chronic obstructive pulmonary disease) ([place])   . Diabetic ulcer of left foot ([place])   . DM2 (diabetes mellitus, type 2) ([place])   . Family history of early CAD   . GERD (gastroesophageal reflux disease)   . 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 History of cardiac catheterization    a. [place] 2002: irregs  //  b. LHC in 8/17: no sig CAD, apical DK, hyperdynamic LV, LVEDP 28  . History of nuclear stress test    a. Nuc 7/17: Overall, intermediate risk nuclear stress test secondary to small size of apical lateral defect and reduced ejection fraction.  EF 43%  . HLD (hyperlipidemia)   . HTN (hypertension)   . Injuries     crushing injury to both his feet in February 2010.   Marland Kitchen Kidney calculi   . Palpitations   . Tobacco abuse     Past Surgical History:  Procedure Laterality Date  . CARDIAC CATHETERIZATION N/A 01/22/2016   Procedure: Left Heart Cath and Coronary Angiography;  Surgeon: Peter M Martinique, MD;  Location: [facility];  Service: Cardiovascular;  Laterality: N/A;  . FOOT AMPUTATION Bilateral   . I&D EXTREMITY Left 12/15/2017   Procedure: IRRIGATION AND DEBRIDEMENT LEFT FOOT ULCER;  Surgeon: Mcarthur Rossetti, MD;  Location: [facility];   Service: Orthopedics;  Laterality: Left;  . LITHOTRIPSY    . TENDON LENGTHENING Bilateral    calf  . TONSILLECTOMY      MEDICATIONS: . [START ON 01/03/2018] ceFAZolin (ANCEF) IVPB 2g/100 mL premix   . albuterol (PROVENTIL HFA;VENTOLIN HFA) 108 (90 Base) MCG/ACT inhaler  . amitriptyline (ELAVIL) 150 MG tablet  . aspirin EC 81 MG tablet  . dapagliflozin propanediol (FARXIGA) 10 MG TABS tablet  . doxycycline (VIBRA-TABS) 100 MG tablet  . Dulaglutide 1.5 MG/0.5ML SOPN  . furosemide (LASIX) 80 MG tablet  . glipiZIDE (GLUCOTROL XL) 10 MG 24 hr tablet  . ipratropium (ATROVENT) 0.02 % nebulizer solution  . LORazepam (ATIVAN) 1 MG tablet  . losartan (COZAAR) 50 MG tablet  . metFORMIN (GLUCOPHAGE-XR) 500 MG 24 hr tablet  . metoprolol succinate (TOPROL-XL) 100 MG 24 hr tablet  . omeprazole (PRILOSEC) 40 MG capsule  . oxyCODONE-acetaminophen (PERCOCET) 10-325 MG per tablet  . potassium chloride (KLOR-CON) 8 MEQ  tablet  . simvastatin (ZOCOR) 40 MG tablet  . tiZANidine (ZANAFLEX) 4 MG capsule  . traZODone (DESYREL) 50 MG tablet     Wynonia Musty [facility] Short Stay Center/Anesthesiology Phone [phone] 01/02/2018 1:02 PM

## 2018-01-03 ENCOUNTER — Encounter (HOSPITAL_COMMUNITY): Payer: Self-pay | Admitting: *Deleted

## 2018-01-03 ENCOUNTER — Encounter (HOSPITAL_COMMUNITY)
Admission: RE | Disposition: A | Discharge status: Home / Self Care | Payer: Self-pay | Source: Ambulatory Visit | Attending: Orthopaedic Surgery

## 2018-01-03 ENCOUNTER — Inpatient Hospital Stay (HOSPITAL_COMMUNITY)
Admission: RE | Admit: 2018-01-03 | Discharge: 2018-01-04 | DRG: 475 | Disposition: A | Discharge status: Home / Self Care | Payer: Medicare HMO | Source: Ambulatory Visit | Attending: Orthopaedic Surgery | Admitting: Orthopaedic Surgery

## 2018-01-03 ENCOUNTER — Inpatient Hospital Stay (HOSPITAL_COMMUNITY): Payer: Medicare HMO | Admitting: Physician Assistant

## 2018-01-03 ENCOUNTER — Other Ambulatory Visit: Payer: Self-pay

## 2018-01-03 DIAGNOSIS — J449 Chronic obstructive pulmonary disease, unspecified: Secondary | ICD-10-CM | POA: Diagnosis present

## 2018-01-03 DIAGNOSIS — L97529 Non-pressure chronic ulcer of other part of left foot with unspecified severity: Secondary | ICD-10-CM | POA: Diagnosis present

## 2018-01-03 DIAGNOSIS — E11621 Type 2 diabetes mellitus with foot ulcer: Secondary | ICD-10-CM | POA: Diagnosis present

## 2018-01-03 DIAGNOSIS — I110 Hypertensive heart disease with heart failure: Secondary | ICD-10-CM | POA: Diagnosis present

## 2018-01-03 DIAGNOSIS — Z9114 Patient's other noncompliance with medication regimen: Secondary | ICD-10-CM | POA: Diagnosis not present

## 2018-01-03 DIAGNOSIS — F17210 Nicotine dependence, cigarettes, uncomplicated: Secondary | ICD-10-CM | POA: Diagnosis present

## 2018-01-03 DIAGNOSIS — IMO0002 Reserved for concepts with insufficient information to code with codable children: Secondary | ICD-10-CM

## 2018-01-03 DIAGNOSIS — L97422 Non-pressure chronic ulcer of left heel and midfoot with fat layer exposed: Secondary | ICD-10-CM

## 2018-01-03 DIAGNOSIS — E08621 Diabetes mellitus due to underlying condition with foot ulcer: Secondary | ICD-10-CM | POA: Diagnosis present

## 2018-01-03 DIAGNOSIS — M79672 Pain in left foot: Secondary | ICD-10-CM | POA: Diagnosis present

## 2018-01-03 DIAGNOSIS — Z7984 Long term (current) use of oral hypoglycemic drugs: Secondary | ICD-10-CM | POA: Diagnosis not present

## 2018-01-03 DIAGNOSIS — Y835 Amputation of limb(s) as the cause of abnormal reaction of the patient, or of later complication, without mention of misadventure at the time of the procedure: Secondary | ICD-10-CM | POA: Diagnosis present

## 2018-01-03 DIAGNOSIS — T8781 Dehiscence of amputation stump: Principal | ICD-10-CM | POA: Diagnosis present

## 2018-01-03 DIAGNOSIS — Z9119 Patient's noncompliance with other medical treatment and regimen: Secondary | ICD-10-CM | POA: Diagnosis not present

## 2018-01-03 DIAGNOSIS — Z89512 Acquired absence of left leg below knee: Secondary | ICD-10-CM

## 2018-01-03 DIAGNOSIS — K219 Gastro-esophageal reflux disease without esophagitis: Secondary | ICD-10-CM | POA: Diagnosis present

## 2018-01-03 DIAGNOSIS — L97521 Non-pressure chronic ulcer of other part of left foot limited to breakdown of skin: Secondary | ICD-10-CM

## 2018-01-03 DIAGNOSIS — E785 Hyperlipidemia, unspecified: Secondary | ICD-10-CM | POA: Diagnosis present

## 2018-01-03 DIAGNOSIS — E669 Obesity, unspecified: Secondary | ICD-10-CM | POA: Diagnosis present

## 2018-01-03 DIAGNOSIS — Z6830 Body mass index (BMI) 30.0-30.9, adult: Secondary | ICD-10-CM | POA: Diagnosis not present

## 2018-01-03 DIAGNOSIS — I5032 Chronic diastolic (congestive) heart failure: Secondary | ICD-10-CM | POA: Diagnosis present

## 2018-01-03 DIAGNOSIS — Z833 Family history of diabetes mellitus: Secondary | ICD-10-CM

## 2018-01-03 HISTORY — DX: Acquired absence of left leg below knee: Z89.512

## 2018-01-03 HISTORY — DX: Gastro-esophageal reflux disease without esophagitis: K21.9

## 2018-01-03 HISTORY — PX: AMPUTATION: SHX166

## 2018-01-03 LAB — GLUCOSE, CAPILLARY
GLUCOSE-CAPILLARY: 165 mg/dL — AB (ref 70–99)
GLUCOSE-CAPILLARY: 251 mg/dL — AB (ref 70–99)
Glucose-Capillary: 114 mg/dL — ABNORMAL HIGH (ref 70–99)

## 2018-01-03 LAB — CBC
HCT: 47 % (ref 39.0–52.0)
Hemoglobin: 14.9 g/dL (ref 13.0–17.0)
MCH: 27.6 pg (ref 26.0–34.0)
MCHC: 31.7 g/dL (ref 30.0–36.0)
MCV: 87.2 fL (ref 78.0–100.0)
PLATELETS: 219 10*3/uL (ref 150–400)
RBC: 5.39 MIL/uL (ref 4.22–5.81)
RDW: 13.3 % (ref 11.5–15.5)
WBC: 15 10*3/uL — AB (ref 4.0–10.5)

## 2018-01-03 LAB — BASIC METABOLIC PANEL
ANION GAP: 14 (ref 5–15)
BUN: 15 mg/dL (ref 6–20)
CALCIUM: 9.3 mg/dL (ref 8.9–10.3)
CO2: 22 mmol/L (ref 22–32)
CREATININE: 0.69 mg/dL (ref 0.61–1.24)
Chloride: 103 mmol/L (ref 98–111)
GFR calc Af Amer: >60 mL/min (ref >60)
GFR calc non Af Amer: >60 mL/min (ref >60)
Glucose, Bld: 182 mg/dL — ABNORMAL HIGH (ref 70–99)
Potassium: 3.6 mmol/L (ref 3.5–5.1)
Sodium: 139 mmol/L (ref 135–145)

## 2018-01-03 SURGERY — AMPUTATION, FOOT, RAY
Anesthesia: General | Site: Foot | Laterality: Left

## 2018-01-03 MED ORDER — METHOCARBAMOL 500 MG PO TABS
500.0000 mg | ORAL_TABLET | Freq: Four times a day (QID) | ORAL | Status: DC | PRN
  Filled 2018-01-03: qty 1

## 2018-01-03 MED ORDER — POTASSIUM CHLORIDE CRYS ER 10 MEQ PO TBCR
10.0000 meq | EXTENDED_RELEASE_TABLET | Freq: Every day | ORAL | Status: DC
  Administered 2018-01-03 – 2018-01-04 (×2): 10 meq via ORAL
  Filled 2018-01-03 (×2): qty 1

## 2018-01-03 MED ORDER — METOCLOPRAMIDE HCL 5 MG PO TABS: 5.0000 mg | ORAL_TABLET | Freq: Three times a day (TID) | ORAL | Status: DC | PRN

## 2018-01-03 MED ORDER — MIDAZOLAM HCL 2 MG/2ML IJ SOLN
INTRAMUSCULAR | Status: AC
  Filled 2018-01-03: qty 2

## 2018-01-03 MED ORDER — OXYCODONE HCL 5 MG PO TABS
10.0000 mg | ORAL_TABLET | ORAL | Status: DC | PRN
  Administered 2018-01-03 (×2): 15 mg via ORAL
  Administered 2018-01-04 (×3): 10 mg via ORAL
  Filled 2018-01-03: qty 2

## 2018-01-03 MED ORDER — TRAZODONE HCL 50 MG PO TABS
50.0000 mg | ORAL_TABLET | Freq: Every day | ORAL | Status: DC
  Administered 2018-01-03: 50 mg via ORAL
  Filled 2018-01-03: qty 1

## 2018-01-03 MED ORDER — FUROSEMIDE 20 MG PO TABS
20.0000 mg | ORAL_TABLET | Freq: Every day | ORAL | Status: DC
Start: 2018-01-03 — End: 2018-01-03

## 2018-01-03 MED ORDER — OXYCODONE HCL 5 MG PO TABS
ORAL_TABLET | ORAL | Status: AC
  Filled 2018-01-03: qty 3

## 2018-01-03 MED ORDER — OXYCODONE-ACETAMINOPHEN 10-325 MG PO TABS: 1.0000 | ORAL_TABLET | Freq: Every day | ORAL | Status: DC

## 2018-01-03 MED ORDER — METFORMIN HCL ER 500 MG PO TB24
1000.0000 mg | ORAL_TABLET | Freq: Two times a day (BID) | ORAL | Status: DC
  Administered 2018-01-03 – 2018-01-04 (×2): 1000 mg via ORAL
  Filled 2018-01-03 (×2): qty 2

## 2018-01-03 MED ORDER — IPRATROPIUM BROMIDE 0.02 % IN SOLN
0.5000 mg | Freq: Four times a day (QID) | RESPIRATORY_TRACT | Status: DC | PRN
Start: 2018-01-03 — End: 2018-01-04

## 2018-01-03 MED ORDER — METHOCARBAMOL 1000 MG/10ML IJ SOLN
500.0000 mg | Freq: Four times a day (QID) | INTRAVENOUS | Status: DC | PRN
  Filled 2018-01-03: qty 5

## 2018-01-03 MED ORDER — ONDANSETRON HCL 4 MG/2ML IJ SOLN: 4.0000 mg | Freq: Once | INTRAMUSCULAR | Status: DC | PRN

## 2018-01-03 MED ORDER — ONDANSETRON HCL 4 MG/2ML IJ SOLN
INTRAMUSCULAR | Status: DC | PRN
  Administered 2018-01-03: 4 mg via INTRAVENOUS

## 2018-01-03 MED ORDER — LIDOCAINE 2% (20 MG/ML) 5 ML SYRINGE
INTRAMUSCULAR | Status: AC
  Filled 2018-01-03: qty 5

## 2018-01-03 MED ORDER — SODIUM CHLORIDE 0.9 % IJ SOLN
INTRAMUSCULAR | Status: AC
  Filled 2018-01-03: qty 20

## 2018-01-03 MED ORDER — INSULIN ASPART 100 UNIT/ML ~~LOC~~ SOLN
0.0000 [IU] | Freq: Three times a day (TID) | SUBCUTANEOUS | Status: DC
  Administered 2018-01-04: 3 [IU] via SUBCUTANEOUS

## 2018-01-03 MED ORDER — EPHEDRINE SULFATE 50 MG/ML IJ SOLN
INTRAMUSCULAR | Status: AC
  Filled 2018-01-03: qty 1

## 2018-01-03 MED ORDER — PROPOFOL 10 MG/ML IV BOLUS
INTRAVENOUS | Status: DC | PRN
  Administered 2018-01-03: 180 mg via INTRAVENOUS

## 2018-01-03 MED ORDER — FENTANYL CITRATE (PF) 100 MCG/2ML IJ SOLN
25.0000 ug | INTRAMUSCULAR | Status: DC | PRN
  Administered 2018-01-03 (×3): 50 ug via INTRAVENOUS

## 2018-01-03 MED ORDER — PHENYLEPHRINE 40 MCG/ML (10ML) SYRINGE FOR IV PUSH (FOR BLOOD PRESSURE SUPPORT)
PREFILLED_SYRINGE | INTRAVENOUS | Status: AC
  Filled 2018-01-03: qty 10

## 2018-01-03 MED ORDER — TIZANIDINE HCL 4 MG PO TABS
12.0000 mg | ORAL_TABLET | Freq: Every day | ORAL | Status: DC
  Administered 2018-01-03: 12 mg via ORAL
  Filled 2018-01-03: qty 3

## 2018-01-03 MED ORDER — FENTANYL CITRATE (PF) 100 MCG/2ML IJ SOLN
INTRAMUSCULAR | Status: DC | PRN
  Administered 2018-01-03: 50 ug via INTRAVENOUS
  Administered 2018-01-03: 100 ug via INTRAVENOUS

## 2018-01-03 MED ORDER — 0.9 % SODIUM CHLORIDE (POUR BTL) OPTIME
TOPICAL | Status: DC | PRN
  Administered 2018-01-03: 1000 mL

## 2018-01-03 MED ORDER — ONDANSETRON HCL 4 MG/2ML IJ SOLN
INTRAMUSCULAR | Status: AC
  Filled 2018-01-03: qty 2

## 2018-01-03 MED ORDER — HYDROMORPHONE HCL 1 MG/ML IJ SOLN
0.5000 mg | INTRAMUSCULAR | Status: DC | PRN
  Administered 2018-01-03 – 2018-01-04 (×3): 1 mg via INTRAVENOUS
  Filled 2018-01-03 (×2): qty 1

## 2018-01-03 MED ORDER — HYDROMORPHONE HCL 1 MG/ML IJ SOLN
INTRAMUSCULAR | Status: AC
  Filled 2018-01-03: qty 1

## 2018-01-03 MED ORDER — METOPROLOL SUCCINATE ER 50 MG PO TB24
50.0000 mg | ORAL_TABLET | Freq: Every day | ORAL | Status: DC
  Administered 2018-01-03: 50 mg via ORAL
  Filled 2018-01-03: qty 1

## 2018-01-03 MED ORDER — GLIPIZIDE ER 10 MG PO TB24
10.0000 mg | ORAL_TABLET | Freq: Every day | ORAL | Status: DC
  Administered 2018-01-04: 10 mg via ORAL
  Filled 2018-01-03: qty 1

## 2018-01-03 MED ORDER — PHENYLEPHRINE 40 MCG/ML (10ML) SYRINGE FOR IV PUSH (FOR BLOOD PRESSURE SUPPORT)
PREFILLED_SYRINGE | INTRAVENOUS | Status: DC | PRN
  Administered 2018-01-03 (×4): 80 ug via INTRAVENOUS

## 2018-01-03 MED ORDER — LOSARTAN POTASSIUM 50 MG PO TABS
50.0000 mg | ORAL_TABLET | Freq: Every day | ORAL | Status: DC
  Administered 2018-01-04: 50 mg via ORAL
  Filled 2018-01-03: qty 1

## 2018-01-03 MED ORDER — METOPROLOL SUCCINATE ER 100 MG PO TB24
100.0000 mg | ORAL_TABLET | Freq: Once | ORAL | Status: AC
  Administered 2018-01-03: 100 mg via ORAL
  Filled 2018-01-03: qty 1

## 2018-01-03 MED ORDER — METOCLOPRAMIDE HCL 5 MG/ML IJ SOLN: 5.0000 mg | Freq: Three times a day (TID) | INTRAMUSCULAR | Status: DC | PRN

## 2018-01-03 MED ORDER — DEXAMETHASONE SODIUM PHOSPHATE 10 MG/ML IJ SOLN
INTRAMUSCULAR | Status: DC | PRN
  Administered 2018-01-03: 10 mg via INTRAVENOUS

## 2018-01-03 MED ORDER — METFORMIN HCL ER 500 MG PO TB24: 1000.0000 mg | ORAL_TABLET | Freq: Two times a day (BID) | ORAL | Status: DC

## 2018-01-03 MED ORDER — CANAGLIFLOZIN 100 MG PO TABS
100.0000 mg | ORAL_TABLET | Freq: Every day | ORAL | Status: DC
  Administered 2018-01-04: 100 mg via ORAL
  Filled 2018-01-03: qty 1

## 2018-01-03 MED ORDER — SODIUM CHLORIDE 0.9 % IV SOLN: INTRAVENOUS | Status: DC

## 2018-01-03 MED ORDER — LORAZEPAM 1 MG PO TABS
1.0000 mg | ORAL_TABLET | Freq: Every day | ORAL | Status: DC
  Administered 2018-01-03: 1 mg via ORAL
  Filled 2018-01-03: qty 1

## 2018-01-03 MED ORDER — DEXAMETHASONE SODIUM PHOSPHATE 10 MG/ML IJ SOLN
INTRAMUSCULAR | Status: AC
  Filled 2018-01-03: qty 1

## 2018-01-03 MED ORDER — DIPHENHYDRAMINE HCL 12.5 MG/5ML PO ELIX: 12.5000 mg | ORAL_SOLUTION | ORAL | Status: DC | PRN

## 2018-01-03 MED ORDER — FENTANYL CITRATE (PF) 250 MCG/5ML IJ SOLN
INTRAMUSCULAR | Status: AC
  Filled 2018-01-03: qty 5

## 2018-01-03 MED ORDER — ONDANSETRON HCL 4 MG PO TABS
4.0000 mg | ORAL_TABLET | Freq: Four times a day (QID) | ORAL | Status: DC | PRN
Start: 2018-01-03 — End: 2018-01-04

## 2018-01-03 MED ORDER — DOCUSATE SODIUM 100 MG PO CAPS
100.0000 mg | ORAL_CAPSULE | Freq: Two times a day (BID) | ORAL | Status: DC
  Administered 2018-01-03: 100 mg via ORAL
  Filled 2018-01-03: qty 1

## 2018-01-03 MED ORDER — METOPROLOL SUCCINATE ER 100 MG PO TB24
100.0000 mg | ORAL_TABLET | Freq: Every day | ORAL | Status: DC
  Administered 2018-01-04: 100 mg via ORAL
  Filled 2018-01-03: qty 1

## 2018-01-03 MED ORDER — ONDANSETRON HCL 4 MG/2ML IJ SOLN: 4.0000 mg | Freq: Four times a day (QID) | INTRAMUSCULAR | Status: DC | PRN

## 2018-01-03 MED ORDER — FENTANYL CITRATE (PF) 100 MCG/2ML IJ SOLN
INTRAMUSCULAR | Status: AC
  Filled 2018-01-03: qty 2

## 2018-01-03 MED ORDER — CEFAZOLIN SODIUM-DEXTROSE 1-4 GM/50ML-% IV SOLN
1.0000 g | Freq: Four times a day (QID) | INTRAVENOUS | Status: DC
  Administered 2018-01-03 – 2018-01-04 (×2): 1 g via INTRAVENOUS
  Filled 2018-01-03 (×3): qty 50

## 2018-01-03 MED ORDER — METOPROLOL SUCCINATE ER 50 MG PO TB24
50.0000 mg | ORAL_TABLET | ORAL | Status: DC
Start: 2018-01-03 — End: 2018-01-03

## 2018-01-03 MED ORDER — MIDAZOLAM HCL 5 MG/5ML IJ SOLN
INTRAMUSCULAR | Status: DC | PRN
  Administered 2018-01-03: 2 mg via INTRAVENOUS

## 2018-01-03 MED ORDER — AMITRIPTYLINE HCL 50 MG PO TABS
150.0000 mg | ORAL_TABLET | Freq: Every day | ORAL | Status: DC
  Administered 2018-01-03: 150 mg via ORAL
  Filled 2018-01-03: qty 3

## 2018-01-03 MED ORDER — MEPERIDINE HCL 50 MG/ML IJ SOLN: 6.2500 mg | INTRAMUSCULAR | Status: DC | PRN

## 2018-01-03 MED ORDER — SIMVASTATIN 40 MG PO TABS
40.0000 mg | ORAL_TABLET | Freq: Every evening | ORAL | Status: DC
  Administered 2018-01-03: 40 mg via ORAL
  Filled 2018-01-03: qty 1

## 2018-01-03 MED ORDER — METOPROLOL SUCCINATE ER 25 MG PO TB24
ORAL_TABLET | ORAL | Status: AC
  Filled 2018-01-03: qty 4

## 2018-01-03 MED ORDER — ACETAMINOPHEN 325 MG PO TABS
325.0000 mg | ORAL_TABLET | Freq: Four times a day (QID) | ORAL | Status: DC | PRN
  Administered 2018-01-04: 650 mg via ORAL
  Filled 2018-01-03: qty 2

## 2018-01-03 MED ORDER — OXYCODONE HCL 5 MG PO TABS
5.0000 mg | ORAL_TABLET | ORAL | Status: DC | PRN
  Filled 2018-01-03 (×2): qty 2
  Filled 2018-01-03: qty 1
  Filled 2018-01-03: qty 2

## 2018-01-03 MED ORDER — ASPIRIN EC 81 MG PO TBEC
81.0000 mg | DELAYED_RELEASE_TABLET | Freq: Every day | ORAL | Status: DC
  Administered 2018-01-04: 81 mg via ORAL
  Filled 2018-01-03: qty 1

## 2018-01-03 MED ORDER — INSULIN ASPART 100 UNIT/ML ~~LOC~~ SOLN: 0.0000 [IU] | Freq: Every day | SUBCUTANEOUS | Status: DC

## 2018-01-03 MED ORDER — LACTATED RINGERS IV SOLN
INTRAVENOUS | Status: DC
  Administered 2018-01-03 (×2): via INTRAVENOUS

## 2018-01-03 MED ORDER — PANTOPRAZOLE SODIUM 40 MG PO TBEC
40.0000 mg | DELAYED_RELEASE_TABLET | Freq: Every day | ORAL | Status: DC
  Administered 2018-01-03 – 2018-01-04 (×2): 40 mg via ORAL
  Filled 2018-01-03 (×2): qty 1

## 2018-01-03 SURGICAL SUPPLY — 46 items
BANDAGE ACE 4X5 VEL STRL LF (GAUZE/BANDAGES/DRESSINGS) ×4 IMPLANT
BANDAGE ESMARK 6X9 LF (GAUZE/BANDAGES/DRESSINGS) IMPLANT
BLADE SAW SGTL 81X20 HD (BLADE) ×2 IMPLANT
BLADE SURG 10 STRL SS (BLADE) ×3 IMPLANT
BNDG CMPR 9X6 STRL LF SNTH (GAUZE/BANDAGES/DRESSINGS)
BNDG COHESIVE 4X5 TAN STRL (GAUZE/BANDAGES/DRESSINGS) ×3 IMPLANT
BNDG ESMARK 6X9 LF (GAUZE/BANDAGES/DRESSINGS)
BNDG GAUZE ELAST 4 BULKY (GAUZE/BANDAGES/DRESSINGS) ×4 IMPLANT
CUFF TOURNIQUET SINGLE 34IN LL (TOURNIQUET CUFF) ×2 IMPLANT
CUFF TOURNIQUET SINGLE 44IN (TOURNIQUET CUFF) IMPLANT
DRAPE U-SHAPE 47X51 STRL (DRAPES) ×6 IMPLANT
DRSG PAD ABDOMINAL 8X10 ST (GAUZE/BANDAGES/DRESSINGS) ×2 IMPLANT
DURAPREP 26ML APPLICATOR (WOUND CARE) ×3 IMPLANT
ELECT REM PT RETURN 9FT ADLT (ELECTROSURGICAL) ×3
ELECTRODE REM PT RTRN 9FT ADLT (ELECTROSURGICAL) ×1 IMPLANT
GAUZE SPONGE 4X4 12PLY STRL (GAUZE/BANDAGES/DRESSINGS) ×3 IMPLANT
GAUZE XEROFORM 5X9 LF (GAUZE/BANDAGES/DRESSINGS) ×3 IMPLANT
GLOVE BIO SURGEON STRL SZ8 (GLOVE) ×3 IMPLANT
GLOVE BIOGEL PI IND STRL 8 (GLOVE) ×1 IMPLANT
GLOVE BIOGEL PI INDICATOR 8 (GLOVE) ×2
GLOVE ORTHO TXT STRL SZ7.5 (GLOVE) ×3 IMPLANT
GOWN STRL REUS W/ TWL LRG LVL3 (GOWN DISPOSABLE) ×1 IMPLANT
GOWN STRL REUS W/ TWL XL LVL3 (GOWN DISPOSABLE) ×4 IMPLANT
GOWN STRL REUS W/TWL LRG LVL3 (GOWN DISPOSABLE) ×3
GOWN STRL REUS W/TWL XL LVL3 (GOWN DISPOSABLE) ×12
KIT BASIN OR (CUSTOM PROCEDURE TRAY) ×3 IMPLANT
KIT TURNOVER KIT B (KITS) ×3 IMPLANT
NS IRRIG 1000ML POUR BTL (IV SOLUTION) ×3 IMPLANT
PACK ORTHO EXTREMITY (CUSTOM PROCEDURE TRAY) ×3 IMPLANT
PAD ABD 8X10 STRL (GAUZE/BANDAGES/DRESSINGS) ×4 IMPLANT
PAD ARMBOARD 7.5X6 YLW CONV (MISCELLANEOUS) ×6 IMPLANT
PAD CAST 4YDX4 CTTN HI CHSV (CAST SUPPLIES) ×1 IMPLANT
PADDING CAST COTTON 4X4 STRL (CAST SUPPLIES)
SPONGE LAP 18X18 X RAY DECT (DISPOSABLE) ×6 IMPLANT
STAPLER VISISTAT 35W (STAPLE) ×3 IMPLANT
STOCKINETTE IMPERVIOUS LG (DRAPES) IMPLANT
SUT ETHILON 2 0 FS 18 (SUTURE) ×2 IMPLANT
SUT ETHILON 2 0 PSLX (SUTURE) ×8 IMPLANT
SUT VIC AB 2-0 CT1 27 (SUTURE) ×3
SUT VIC AB 2-0 CT1 TAPERPNT 27 (SUTURE) IMPLANT
TOWEL OR 17X24 6PK STRL BLUE (TOWEL DISPOSABLE) ×3 IMPLANT
TOWEL OR 17X26 10 PK STRL BLUE (TOWEL DISPOSABLE) ×3 IMPLANT
TUBE CONNECTING 12'X1/4 (SUCTIONS) ×1
TUBE CONNECTING 12X1/4 (SUCTIONS) ×2 IMPLANT
WATER STERILE IRR 1000ML POUR (IV SOLUTION) ×3 IMPLANT
YANKAUER SUCT BULB TIP NO VENT (SUCTIONS) ×3 IMPLANT

## 2018-01-03 NOTE — Brief Op Note (Signed)
01/03/2018  3:21 PM  PATIENT:  Troy [name]  57 y.o. male  PRE-OPERATIVE DIAGNOSIS:  left foot chronic wound breakdown  POST-OPERATIVE DIAGNOSIS:  left foot chronic wound breakdown  PROCEDURE:  Procedure(s): LEFT MIDFOOT AMPUTATION/REVISION MIDAMPUTAION (Left)  SURGEON:  Surgeon(s) and Role:    Mcarthur Rossetti, MD - Primary  PHYSICIAN ASSISTANT: Benita Stabile, PA-C  ANESTHESIA:   general  COUNTS:  YES  DICTATION: .Other Dictation: Dictation Number [id number]  PLAN OF CARE: Admit to inpatient   PATIENT DISPOSITION:  PACU - hemodynamically stable.   Delay start of Pharmacological VTE agent (>24hrs) due to surgical blood loss or risk of bleeding: no

## 2018-01-03 NOTE — Anesthesia Preprocedure Evaluation (Addendum)
Anesthesia Evaluation  Patient identified by MRN, date of birth, ID band Patient awake    Reviewed: Allergy & Precautions, NPO status , Patient's Chart, lab work & pertinent test results  Airway Mallampati: II  TM Distance: >3 FB Neck ROM: Full    Dental no notable dental hx. (+) Caps, Dental Advisory Given, Upper Dentures   Pulmonary COPD,  COPD inhaler, Current Smoker,    Pulmonary exam normal breath sounds clear to auscultation       Cardiovascular hypertension, Pt. on medications and Pt. on home beta blockers +CHF  Normal cardiovascular exam+ Valvular Problems/Murmurs  Rhythm:Regular Rate:Normal     Neuro/Psych PSYCHIATRIC DISORDERS Anxiety Depression    GI/Hepatic Neg liver ROS,   Endo/Other  diabetes, Poorly Controlled, Type 2, Oral Hypoglycemic AgentsHyperlipidemia  Renal/GU ARFRenal disease  negative genitourinary   Musculoskeletal Chronic open wound left foot Hx/o trans met amputation Hx/o Crush injury 2010   Abdominal (+) + obese,   Peds  Hematology negative hematology ROS (+)   Anesthesia Other Findings   Reproductive/Obstetrics                            Anesthesia Physical Anesthesia Plan  ASA: III  Anesthesia Plan: General   Post-op Pain Management:    Induction: Intravenous  PONV Risk Score and Plan: 3 and Ondansetron, Midazolam, Treatment may vary due to age or medical condition and Dexamethasone  Airway Management Planned: LMA  Additional Equipment:   Intra-op Plan:   Post-operative Plan: Extubation in OR  Informed Consent: I have reviewed the patients History and Physical, chart, labs and discussed the procedure including the risks, benefits and alternatives for the proposed anesthesia with the patient or authorized representative who has indicated his/her understanding and acceptance.   Dental advisory given  Plan Discussed with: CRNA and  Surgeon  Anesthesia Plan Comments:         Anesthesia Quick Evaluation

## 2018-01-03 NOTE — Anesthesia Procedure Notes (Signed)
Procedure Name: LMA Insertion Date/Time: 01/03/2018 2:36 PM Performed by: Jenne Campus, CRNA Pre-anesthesia Checklist: Patient identified, Emergency Drugs available, Suction available and Patient being monitored Patient Re-evaluated:Patient Re-evaluated prior to induction Oxygen Delivery Method: Circle System Utilized Preoxygenation: Pre-oxygenation with 100% oxygen Induction Type: IV induction Ventilation: Mask ventilation without difficulty LMA: LMA inserted LMA Size: 4.0 Number of attempts: 1 Airway Equipment and Method: Bite block Placement Confirmation: positive ETCO2 and breath sounds checked- equal and bilateral Tube secured with: Tape Dental Injury: Teeth and Oropharynx as per pre-operative assessment

## 2018-01-03 NOTE — Op Note (Signed)
NAMEROMEN, YUTZY MEDICAL RECORD [id number] ACCOUNT [id number] DATE OF BIRTH:09-09-1960 FACILITY: [facility] LOCATION: [facility] PHYSICIAN:Troy Huckaba Kerry Fort, MD  OPERATIVE REPORT  DATE OF PROCEDURE:  01/03/2018  PREOPERATIVE DIAGNOSIS:  Left foot wound with nonhealing ulcer and dehiscence, status post irrigation and debridement of right foot ulcer.  POSTOPERATIVE DIAGNOSIS:  Left foot wound with nonhealing ulcer and dehiscence, status post irrigation and debridement of right foot ulcer.  PROCEDURE:  Revision left mid foot amputation.  SURGEON:  Troy [name]. Troy Linden, MD  ASSISTANT:  Troy Emery, PA-C.  ANESTHESIA:  General.  ESTIMATED BLOOD LOSS:  Less  than 100 mL.  COMPLICATIONS:  None.  INDICATIONS:  The patient is a 57 year old gentleman with diabetes who had a crushing injury over 8 years ago to his left foot.  He ended up with a transmetatarsal amputation.  We ended up having to shorten this a little bit at that time.  He had done  well for many years, but his diabetes, worsened and he became noncompliant with his medications.  Several months ago, he developed an ulcer at of his foot stump and this developed into necrosis and a superficial infection.  We took him back to the  operating room a few weeks ago and removed the ulcers tissue and tried to reapproximate the skin.  This did not heal.  He now presents for amputation almost to the hindfoot.  He would rather try this and save his heal than going with a below-knee  amputation.  He ambulates so far this way.  He has work on better blood glucose control and we have experienced good bleeding tissue when we have done surgery in the past.  He understands that if this does not work, though, he will likely need a  below-knee amputation.  DESCRIPTION OF PROCEDURE:  After informed consent was obtained and appropriate left foot and ankle were marked.  He was brought to the operating room and placed supine on the operating  table where general anesthesia was then obtained.  His left foot and  ankle and lower leg were prepped and draped with Betadine scrub and paint.  Timeout was called and he was identified as correct patient, correct left foot.  I then made a wide fish mouth type of incision so I could expose the whole aspect of the  remaining mid foot.  We did not find any evidence of infection at all.  I was able to clean out necrotic tissue with a #10 blade and a rongeur.  I then used an oscillating saw and backed out of the foot through the cuneiform bones.  We were able to  experience a good bleeding tissue from there and we were able to range the soft tissue and bring a flap from posterior lateral to medial to have good cushion around the whole foot.  All the tissue did debride well.  We irrigated the tissue with normal  saline solution and then we were able to reapproximate the flap with interrupted 2-0 nylon suture.  Xeroform well-padded sterile dressing was applied.    He was awakened, extubated, and taken to recovery room in stable condition.  All final counts were correct.  There were no complications noted.    PLAN:  Postoperatively, we will have him nonweightbearing on that foot and we will see him back in the office in 2 weeks.  We are going to admit him as an inpatient for blood glucose control and antibiotics as well as physical therapy.  AN/NUANCE  D:01/03/2018  T:01/03/2018 JOB:[id number]

## 2018-01-03 NOTE — Transfer of Care (Signed)
Immediate Anesthesia Transfer of Care Note  Patient: Troy [name]  Procedure(s) Performed: LEFT MIDFOOT AMPUTATION/REVISION MIDAMPUTAION (Left Foot)  Patient Location: PACU  Anesthesia Type:General  Level of Consciousness: awake, oriented and patient cooperative  Airway & Oxygen Therapy: Patient Spontanous Breathing and Patient connected to nasal cannula oxygen  Post-op Assessment: Report given to RN and Post -op Vital signs reviewed and stable  Post vital signs: Reviewed  Last Vitals:  Vitals Value Taken Time  BP 112/80 01/03/2018  3:47 PM  Temp    Pulse 110 01/03/2018  3:55 PM  Resp 19 01/03/2018  3:55 PM  SpO2 98 % 01/03/2018  3:55 PM  Vitals shown include unvalidated device data.  Last Pain:  Vitals:   01/03/18 1210  TempSrc: Oral  PainSc:       Patients Stated Pain Goal: 3 (59/97/74 1423)  Complications: No apparent anesthesia complications

## 2018-01-03 NOTE — H&P (Signed)
Troy [name] is an 57 y.o. male.   Chief Complaint: Nonhealing wound on the end of residual left foot post midfoot amputation HPI: The patient is well-known to Troy [name].  He is a 57 year old who sustained a crushing injury to his left foot remotely.  He ended up with a midfoot amputation.  More recently he has developed a nonhealing wound on of his foot and is been noncompliant with his diabetes in terms of his medications.  His blood glucose is been running significantly high.  We recently taken to the operating room to try to get the wound to heal with an irrigation debridement and closure.  This is already broken down and showing that he will likely need an amputation at a higher level.  He wants Troy [name] to try at least one more time with backing the bone up further so he can still put weight through his heel.  He understands that there is a likelihood that this may not work and would eventually necessitated below-knee amputation.  Past Medical History:  Diagnosis Date  . Acute renal failure (Troy [name])    in setting of NSAID use and orthopedic surgery 2010  . Anxiety and depression   . Chronic diastolic CHF (congestive heart failure) (Troy [name])    a. Echo 6/17: severe conc LVH, vigorous EF, EF 65-70%, no dynamic obstruction, no RWMA, Gr 1 DD, mild TR  //  b. LHC 8/17: no sig CAD, LVEDP 28  . COPD (chronic obstructive pulmonary disease) (Troy [name])   . Diabetic ulcer of left foot (Troy [name])   . DM2 (diabetes mellitus, type 2) (Troy [name])   . Family history of early CAD   . GERD (gastroesophageal reflux disease)   . History of amputation of foot (Troy [name])    L trans-met // R toe  . History of cardiac catheterization    a. Troy [name] 2002: irregs  //  b. LHC in 8/17: no sig CAD, apical DK, hyperdynamic LV, LVEDP 28  . History of nuclear stress test    a. Nuc 7/17: Overall, intermediate risk nuclear stress test secondary to small size of apical lateral defect and reduced ejection fraction.  EF 43%  . HLD (hyperlipidemia)   . HTN  (hypertension)   . Injuries     crushing injury to both his feet in February 2010.   Marland Kitchen Kidney calculi   . Palpitations   . Tobacco abuse     Past Surgical History:  Procedure Laterality Date  . CARDIAC CATHETERIZATION N/A 01/22/2016   Procedure: Left Heart Cath and Coronary Angiography;  Surgeon: Peter M Martinique, MD;  Location: [facility];  Service: Cardiovascular;  Laterality: N/A;  . FOOT AMPUTATION Bilateral   . I&D EXTREMITY Left 12/15/2017   Procedure: IRRIGATION AND DEBRIDEMENT LEFT FOOT ULCER;  Surgeon: Mcarthur Rossetti, MD;  Location: [facility];  Service: Orthopedics;  Laterality: Left;  . LITHOTRIPSY    . TENDON LENGTHENING Bilateral    calf  . TONSILLECTOMY      Family History  Problem Relation Age of Onset  . Leukemia Mother 37       died  . Lung cancer Father 2       died  . Heart attack Brother 76  . Heart attack Brother 58  . Hypertension Brother        X3  . Hypertension Sister        X67  . Diabetes Sister   . Stroke Sister   . Diabetes Sister   . Other  Brother        Car accident   Social History:  reports that he has been smoking cigarettes.  He has a 20.00 pack-year smoking history. He has never used smokeless tobacco. He reports that he does not drink alcohol or use drugs.  Allergies:  Allergies  Allergen Reactions  . Hydromorphone Hcl Er Anaphylaxis    Allergic to DYE in extended-release tablet, can tolerate other forms of hydromorphone  . Nucynta [Tapentadol Hydrochloride] Other (See Comments)    Throat swells  . Tapentadol Swelling and Other (See Comments)    Throat swells Angiodema  . Exalamide Other (See Comments)    UNSPECIFIED REACTION     Medications Prior to Admission  Medication Sig Dispense Refill  . albuterol (PROVENTIL HFA;VENTOLIN HFA) 108 (90 Base) MCG/ACT inhaler Inhale 2 puffs into the lungs every 6 (six) hours as needed for wheezing or shortness of breath. (Patient not taking: Reported on 12/12/2017) 1 Inhaler 6  .  amitriptyline (ELAVIL) 150 MG tablet Take 150 mg by mouth at bedtime.      . aspirin EC 81 MG tablet Take 1 tablet (81 mg total) by mouth daily. 90 tablet 3  . dapagliflozin propanediol (FARXIGA) 10 MG TABS tablet Take 10 mg by mouth daily.    . doxycycline (VIBRA-TABS) 100 MG tablet Take 1 tablet (100 mg total) by mouth 2 (two) times daily. 30 tablet 0  . Dulaglutide 1.5 MG/0.5ML SOPN Inject 1.5 mg into the skin every Wednesday.     . furosemide (LASIX) 80 MG tablet Take 0.5 tablets (40 mg total) by mouth daily. (Patient taking differently: Take 20 mg by mouth daily. ) 15 tablet 6  . glipiZIDE (GLUCOTROL XL) 10 MG 24 hr tablet Take 10 mg by mouth daily with breakfast.    . ipratropium (ATROVENT) 0.02 % nebulizer solution Take 2.5 mLs (0.5 mg total) by nebulization 4 (four) times daily. (Patient taking differently: Take 0.5 mg by nebulization 4 (four) times daily as needed for wheezing or shortness of breath. ) 75 mL 12  . LORazepam (ATIVAN) 1 MG tablet Take 1 mg by mouth at bedtime.     . losartan (COZAAR) 50 MG tablet TAKE 1 TABLET (50 MG TOTAL) BY MOUTH DAILY. 90 tablet 3  . metFORMIN (GLUCOPHAGE-XR) 500 MG 24 hr tablet Take 1,000 mg by mouth 2 (two) times daily.     . metoprolol succinate (TOPROL-XL) 100 MG 24 hr tablet Take 100 mg every AM and 50 mg every PM. Take with or immediately following a meal. (Patient taking differently: Take 50-100 mg by mouth See admin instructions. Take 100 mg every AM and 50 mg every PM. Take with or immediately following a meal.) 135 tablet 3  . omeprazole (PRILOSEC) 40 MG capsule Take 40 mg by mouth daily.    . oxyCODONE-acetaminophen (PERCOCET) 10-325 MG per tablet Take 1 tablet by mouth 5 (five) times daily.     . potassium chloride (KLOR-CON) 8 MEQ tablet Take 8 mEq by mouth daily.    . simvastatin (ZOCOR) 40 MG tablet Take 40 mg by mouth every evening.     . tiZANidine (ZANAFLEX) 4 MG capsule Take 12 mg by mouth at bedtime.     . traZODone (DESYREL) 50 MG  tablet Take 50 mg by mouth at bedtime.       No results found for this or any previous visit (from the past 48 hour(s)). No results found.  ROS  Height 5' 11" (1.803 m), weight 220 lb (  99.8 kg). Physical Exam  Constitutional: He is oriented to person, place, and time. He appears well-developed and well-nourished.  HENT:  Head: Normocephalic and atraumatic.  Eyes: Pupils are equal, round, and reactive to light. EOM are normal.  Neck: Normal range of motion. Neck supple.  Cardiovascular: Normal rate.  GI: Soft.  Musculoskeletal:       Feet:  Neurological: He is alert and oriented to person, place, and time.  Skin: Skin is warm.  Psychiatric: He has a normal mood and affect.     Assessment/Plan Left foot with nonhealing wound with a history of a midfoot amputation and poorly controlled diabetes.  We will proceed to surgery today to see if we can further back of the foot in terms of removing bone and other bony structures to allow for soft tissue coverage over the end of the remaining foot.  He understands that blood glucose control is important.  He also understands that if this does not work that he will most likely need a below-knee amputation.  The plan is to proceed to surgery today and then admit him as an inpatient for antibiotics, mobility and blood glucose control.  Mcarthur Rossetti, MD 01/03/2018, 11:58 AM

## 2018-01-03 NOTE — Progress Notes (Signed)
Patient has no VTE orders. Paged MD. Call back received from Dr Lorin Mercy to put patient on SCD's.

## 2018-01-04 LAB — GLUCOSE, CAPILLARY: Glucose-Capillary: 161 mg/dL — ABNORMAL HIGH (ref 70–99)

## 2018-01-04 NOTE — Progress Notes (Signed)
Pt is anxious to go home. Discharge instructions was given after pt had PT. Discharged to home accompanied by wife.

## 2018-01-04 NOTE — Discharge Instructions (Signed)
Do not put weight on your left foot. You may put your heel down for balance. Keep your foot clean and dry. Leave your current dressing on for 4-5 days before changing to a new dressing.

## 2018-01-04 NOTE — Discharge Summary (Signed)
Patient ID: Troy [name] MRN: [id number] DOB/AGE: [birth year] 57 y.o.  Admit date: 01/03/2018 Discharge date: 01/04/2018  Admission Diagnoses:  Principal Problem:   Diabetic ulcer of left foot associated with diabetes mellitus due to underlying condition [facility]) Active Problems:   Foot amputation status, left [facility])   Discharge Diagnoses:  Same  Past Medical History:  Diagnosis Date  . Acute renal failure ([place])    in setting of NSAID use and orthopedic surgery 2010  . Anxiety and depression   . Chronic diastolic CHF (congestive heart failure) ([place])    a. Echo 6/17: severe conc LVH, vigorous EF, EF 65-70%, no dynamic obstruction, no RWMA, Gr 1 DD, mild TR  //  b. LHC 8/17: no sig CAD, LVEDP 28  . COPD (chronic obstructive pulmonary disease) ([place])   . Diabetic ulcer of left foot ([place])   . DM2 (diabetes mellitus, type 2) ([place])   . Family history of early CAD   . GERD (gastroesophageal reflux disease)   . History of amputation of foot ([place])    L trans-met // R toe  . History of cardiac catheterization    a. [place] 2002: irregs  //  b. LHC in 8/17: no sig CAD, apical DK, hyperdynamic LV, LVEDP 28  . History of nuclear stress test    a. Nuc 7/17: Overall, intermediate risk nuclear stress test secondary to small size of apical lateral defect and reduced ejection fraction.  EF 43%  . HLD (hyperlipidemia)   . HTN (hypertension)   . Injuries     crushing injury to both his feet in February 2010.   Marland Kitchen Kidney calculi   . Palpitations   . Tobacco abuse     Surgeries: Procedure(s): LEFT MIDFOOT AMPUTATION/REVISION MIDAMPUTAION on 01/03/2018   Consultants:   Discharged Condition: Improved  Hospital Course: Troy [name] is an 57 y.o. male who was admitted 01/03/2018 for operative treatment ofDiabetic ulcer of left foot associated with diabetes mellitus due to underlying condition ([place]). Patient has severe unremitting pain that affects sleep, daily activities, and work/hobbies. After pre-op  clearance the patient was taken to the operating room on 01/03/2018 and underwent  Procedure(s): LEFT MIDFOOT AMPUTATION/REVISION MIDAMPUTAION.    Patient was given perioperative antibiotics:  Anti-infectives (From admission, onward)   Start     Dose/Rate Route Frequency Ordered Stop   01/03/18 2000  ceFAZolin (ANCEF) IVPB 1 g/50 mL premix     1 g 100 mL/hr over 30 Minutes Intravenous Every 6 hours 01/03/18 1838 01/04/18 1559   01/03/18 1300  ceFAZolin (ANCEF) IVPB 2g/100 mL premix     2 g 200 mL/hr over 30 Minutes Intravenous To ShortStay Surgical 01/02/18 1145 01/03/18 1453       Patient was given sequential compression devices, early ambulation, and chemoprophylaxis to prevent DVT.  Patient benefited maximally from hospital stay and there were no complications.    Recent vital signs:  Patient Vitals for the past 24 hrs:  BP Temp Temp src Pulse Resp SpO2 Height Weight  01/04/18 0355 108/67 98.4 F (36.9 C) Oral 93 18 97 % - -  01/03/18 2301 - 98.2 F (36.8 C) Oral (!) 103 16 97 % - -  01/03/18 2258 104/68 - - 98 - - - -  01/03/18 2006 110/62 98 F (36.7 C) Oral (!) 104 16 97 % - -  01/03/18 1841 96/61 97.9 F (36.6 C) Oral (!) 105 17 95 % - -  01/03/18 1815 - - - (!) 110 18  98 % - -  01/03/18 1805 - - - (!) 111 15 97 % - -  01/03/18 1803 98/63 - - (!) 112 13 95 % - -  01/03/18 1748 99/65 - - (!) 104 11 94 % - -  01/03/18 1735 99/60 - - (!) 103 12 94 % - -  01/03/18 1725 - - - (!) 105 13 95 % - -  01/03/18 1705 - - - (!) 106 15 96 % - -  01/03/18 1703 103/64 - - (!) 105 14 95 % - -  01/03/18 1700 - - - (!) 106 12 95 % - -  01/03/18 1652 - - - (!) 103 13 95 % - -  01/03/18 1650 - - - (!) 103 (!) 8 96 % - -  01/03/18 1648 (!) 97/59 - - (!) 101 14 95 % - -  01/03/18 1645 - - - (!) 102 14 97 % - -  01/03/18 1633 96/60 - - (!) 104 14 96 % - -  01/03/18 1630 - - - (!) 106 15 98 % - -  01/03/18 1618 (!) 87/50 - - (!) 108 14 98 % - -  01/03/18 1549 - 98 F (36.7 C) - - - - - -   01/03/18 1210 109/75 98.5 F (36.9 C) Oral 100 18 99 % - -  01/03/18 1153 - - - - - - _0  (1.803 m) 220 lb (99.8 kg)     Recent laboratory studies:  Recent Labs    01/03/18 1151  WBC 15.0*  HGB 14.9  HCT 47.0  PLT 219  NA 139  K 3.6  CL 103  CO2 22  BUN 15  CREATININE 0.69  GLUCOSE 182*  CALCIUM 9.3     Discharge Medications:   Allergies as of 01/04/2018      Reactions   Hydromorphone Hcl Er Anaphylaxis   Allergic to DYE in extended-release tablet, can tolerate other forms of hydromorphone   Nucynta [tapentadol Hydrochloride] Other (See Comments)   Throat swells   Tapentadol Swelling, Other (See Comments)   Throat swells Angiodema   Exalamide Other (See Comments)   UNSPECIFIED REACTION       Medication List    TAKE these medications   albuterol 108 (90 Base) MCG/ACT inhaler Commonly known as:  PROVENTIL HFA;VENTOLIN HFA Inhale 2 puffs into the lungs every 6 (six) hours as needed for wheezing or shortness of breath.   amitriptyline 150 MG tablet Commonly known as:  ELAVIL Take 150 mg by mouth at bedtime.   aspirin EC 81 MG tablet Take 1 tablet (81 mg total) by mouth daily.   doxycycline 100 MG tablet Commonly known as:  VIBRA-TABS Take 1 tablet (100 mg total) by mouth 2 (two) times daily.   Dulaglutide 1.5 MG/0.5ML Sopn Inject 1.5 mg into the skin every Wednesday.   FARXIGA 10 MG Tabs tablet Generic drug:  dapagliflozin propanediol Take 10 mg by mouth daily.   furosemide 80 MG tablet Commonly known as:  LASIX Take 0.5 tablets (40 mg total) by mouth daily. What changed:  how much to take   glipiZIDE 10 MG 24 hr tablet Commonly known as:  GLUCOTROL XL Take 10 mg by mouth daily with breakfast.   ipratropium 0.02 % nebulizer solution Commonly known as:  ATROVENT Take 2.5 mLs (0.5 mg total) by nebulization 4 (four) times daily. What changed:    when to take this  reasons to take this   LORazepam 1 MG  tablet Commonly known as:   ATIVAN Take 1 mg by mouth at bedtime.   losartan 50 MG tablet Commonly known as:  COZAAR TAKE 1 TABLET (50 MG TOTAL) BY MOUTH DAILY.   metFORMIN 500 MG 24 hr tablet Commonly known as:  GLUCOPHAGE-XR Take 1,000 mg by mouth 2 (two) times daily.   metoprolol succinate 100 MG 24 hr tablet Commonly known as:  TOPROL-XL Take 100 mg every AM and 50 mg every PM. Take with or immediately following a meal. What changed:    how much to take  how to take this  when to take this  additional instructions   omeprazole 40 MG capsule Commonly known as:  PRILOSEC Take 40 mg by mouth daily.   oxyCODONE-acetaminophen 10-325 MG tablet Commonly known as:  PERCOCET Take 1 tablet by mouth 5 (five) times daily.   potassium chloride 8 MEQ tablet Commonly known as:  KLOR-CON Take 8 mEq by mouth daily.   simvastatin 40 MG tablet Commonly known as:  ZOCOR Take 40 mg by mouth every evening.   tiZANidine 4 MG capsule Commonly known as:  ZANAFLEX Take 12 mg by mouth at bedtime.   traZODone 50 MG tablet Commonly known as:  DESYREL Take 50 mg by mouth at bedtime.            Durable Medical Equipment  (From admission, onward)        Start     Ordered   01/03/18 1839  DME Walker rolling  Once    Question:  Patient needs a walker to treat with the following condition  Answer:  Foot amputation status, left [facility])   01/03/18 1838      Diagnostic Studies: No results found.  Disposition: Discharge disposition: 01-Home or Self Care         Follow-up Information    Mcarthur Rossetti, MD. Schedule an appointment as soon as possible for a visit in 2 week(s).   Specialty:  Orthopedic Surgery Contact information: [place] [address] [phone]            Signed: Mcarthur Rossetti 01/04/2018, 7:41 AM

## 2018-01-04 NOTE — Evaluation (Signed)
Physical Therapy Evaluation Patient Details Name: Troy [name] MRN: [id number] DOB: [birth year] Today's Date: 01/04/2018   History of Present Illness  Pt is a 57 y/o male s/p L mid foot amputation revision. PMH including but not limited to CHF, COPD, DM and HTN.  Clinical Impression  Pt presented supine in bed with HOB elevated, awake and willing to participate in therapy session. Prior to admission, pt reported that he was independent with all functional mobility and ADLs. Pt's wife present throughout session as well and reported that she will be able to provide 24/7 supervision/assistance upon d/c. Pt currently requires supervision for bed mobility, min guard for transfers and min guard to ambulate a very short distance within his room with RW. Pt impulsive throughout and required cueing for safety and to maintain NWB L LE. PT will continue to follow acutely to progress mobility as tolerated.     Follow Up Recommendations Supervision/Assistance - 24 hour    Equipment Recommendations  Wheelchair (measurements PT);Wheelchair cushion (measurements PT);Other (comment)(w/c with elevating leg rests)    Recommendations for Other Services       Precautions / Restrictions Precautions Precautions: Fall Restrictions Weight Bearing Restrictions: Yes LLE Weight Bearing: Non weight bearing      Mobility  Bed Mobility Overal bed mobility: Needs Assistance Bed Mobility: Supine to Sit;Sit to Supine     Supine to sit: Supervision Sit to supine: Supervision   General bed mobility comments: increased time and effort, supervision for safety  Transfers Overall transfer level: Needs assistance Equipment used: Rolling walker (2 wheeled) Transfers: Sit to/from Stand Sit to Stand: Min guard         General transfer comment: min guard for safety; pt a bit impulsive and refusing to wear gait belt for safety  Ambulation/Gait Ambulation/Gait assistance: Min guard Gait Distance (Feet): 10  Feet Assistive device: Rolling walker (2 wheeled) Gait Pattern/deviations: (hop-to on R LE) Gait velocity: decreased Gait velocity interpretation: <1.31 ft/sec, indicative of household ambulator General Gait Details: pt with modest instability, impulsive throughout requiring close min guard and cueing for safety  Stairs Stairs: (pt declining stair training)          Wheelchair Mobility    Modified Rankin (Stroke Patients Only)       Balance Overall balance assessment: Needs assistance Sitting-balance support: Feet supported Sitting balance-Leahy Scale: Good     Standing balance support: During functional activity;Bilateral upper extremity supported Standing balance-Leahy Scale: Poor                               Pertinent Vitals/Pain Pain Assessment: Faces Faces Pain Scale: Hurts whole lot Pain Location: L foot Pain Descriptors / Indicators: Sore;Grimacing Pain Intervention(s): Monitored during session;Repositioned    Home Living Family/patient expects to be discharged to:: Private residence Living Arrangements: Spouse/significant other Available Help at Discharge: Family;Available 24 hours/day Type of Home: House Home Access: Stairs to enter   [employer] of Steps: 1 Home Layout: One level Home Equipment: Walker - 2 wheels;Bedside commode;Shower seat      Prior Function Level of Independence: Independent               Hand Dominance        Extremity/Trunk Assessment   Upper Extremity Assessment Upper Extremity Assessment: Overall WFL for tasks assessed    Lower Extremity Assessment Lower Extremity Assessment: Overall WFL for tasks assessed;LLE deficits/detail LLE Deficits / Details: pt with ACE wrap donned  to foot; required cueing as a reminder of [place] precautions initially    Cervical / Trunk Assessment Cervical / Trunk Assessment: Normal  Communication   Communication: No difficulties  Cognition  Arousal/Alertness: Awake/alert Behavior During Therapy: Impulsive Overall Cognitive Status: Impaired/Different from baseline Area of Impairment: Safety/judgement                         Safety/Judgement: Decreased awareness of deficits;Decreased awareness of safety            General Comments      Exercises     Assessment/Plan    PT Assessment Patient needs continued PT services  PT Problem List Decreased strength;Decreased range of motion;Decreased activity tolerance;Decreased balance;Decreased mobility;Decreased coordination;Decreased safety awareness;Decreased knowledge of use of DME;Decreased knowledge of precautions;Pain       PT Treatment Interventions DME instruction;Gait training;Stair training;Functional mobility training;Therapeutic activities;Therapeutic exercise;Neuromuscular re-education;Balance training;Patient/family education    PT Goals (Current goals can be found in the Care Plan section)  Acute Rehab PT Goals Patient Stated Goal: return home immediately PT Goal Formulation: With patient Time For Goal Achievement: 01/18/18 Potential to Achieve Goals: Good    Frequency Min 3X/week   Barriers to discharge        Co-evaluation               AM-PAC PT "6 Clicks" Daily Activity  Outcome Measure Difficulty turning over in bed (including adjusting bedclothes, sheets and blankets)?: None Difficulty moving from lying on back to sitting on the side of the bed? : None Difficulty sitting down on and standing up from a chair with arms (e.g., wheelchair, bedside commode, etc,.)?: Unable Help needed moving to and from a bed to chair (including a wheelchair)?: A Little Help needed walking in hospital room?: A Little Help needed climbing 3-5 steps with a railing? : A Lot 6 Click Score: 17    End of Session Equipment Utilized During Treatment: Other (comment)(pt refusing gait belt) Activity Tolerance: Patient tolerated treatment well Patient  left: in bed;with call bell/phone within reach;with family/visitor present Nurse Communication: Mobility status PT Visit Diagnosis: Other abnormalities of gait and mobility (R26.89);Pain Pain - Right/Left: Left Pain - part of body: Ankle and joints of foot    Time: 4081-4481 PT Time Calculation (min) (ACUTE ONLY): 10 min   Charges:   PT Evaluation $PT Eval Low Complexity: 1 Low     PT G Codes:        Pauline, PT, DPT [place] 01/04/2018, 12:05 PM

## 2018-01-04 NOTE — Progress Notes (Signed)
Patient ID: Troy [name], male   DOB: Jul 03, 1960, 57 y.o.   MRN: [id number] Tolerated surgery well yesterday.  Will need to be non-weight bearing on his left foot.  He has a walker at home and his wife is given a script for a wheelchair.  Can be discharged to home today after PT gets him up for mobility.

## 2018-01-04 NOTE — Anesthesia Postprocedure Evaluation (Signed)
Anesthesia Post Note  Patient: BUELL PARCEL  Procedure(s) Performed: LEFT MIDFOOT AMPUTATION/REVISION MIDAMPUTAION (Left Foot)     Patient location during evaluation: PACU Anesthesia Type: General Level of consciousness: awake and alert Pain management: pain level controlled Vital Signs Assessment: post-procedure vital signs reviewed and stable Respiratory status: spontaneous breathing, nonlabored ventilation, respiratory function stable and patient connected to nasal cannula oxygen Cardiovascular status: blood pressure returned to baseline and stable Postop Assessment: no apparent nausea or vomiting Anesthetic complications: no    Last Vitals:  Vitals:   01/03/18 2301 01/04/18 0355  BP:  108/67  Pulse: (!) 103 93  Resp: 16 18  Temp: 36.8 C 36.9 C  SpO2: 97% 97%    Last Pain:  Vitals:   01/04/18 0616  TempSrc:   PainSc: 4                  Montez Hageman

## 2018-01-05 ENCOUNTER — Encounter (HOSPITAL_COMMUNITY): Payer: Self-pay | Admitting: Orthopaedic Surgery

## 2018-01-09 NOTE — Addendum Note (Signed)
Addendum  created 01/09/18 2008 by Roberts Gaudy, MD   Intraprocedure Staff edited

## 2018-01-12 ENCOUNTER — Telehealth (INDEPENDENT_AMBULATORY_CARE_PROVIDER_SITE_OTHER): Payer: Self-pay

## 2018-01-12 NOTE — Telephone Encounter (Signed)
Patients wife called states she removed patients dressing and is having bleeding , odor, and is "opening up". Has f/u appt on Monday. I advised this to [place]. She states to put a guaze and wrap it back up if not any better than they can be worked in tomorrow Friday.

## 2018-01-13 ENCOUNTER — Ambulatory Visit (INDEPENDENT_AMBULATORY_CARE_PROVIDER_SITE_OTHER): Payer: Medicare HMO | Admitting: Orthopaedic Surgery

## 2018-01-13 ENCOUNTER — Encounter (INDEPENDENT_AMBULATORY_CARE_PROVIDER_SITE_OTHER): Payer: Self-pay | Admitting: Orthopaedic Surgery

## 2018-01-13 VITALS — BP 162/95 | HR 107 | Ht 71.0 in | Wt 225.0 lb

## 2018-01-13 DIAGNOSIS — Z89432 Acquired absence of left foot: Secondary | ICD-10-CM

## 2018-01-13 DIAGNOSIS — IMO0002 Reserved for concepts with insufficient information to code with codable children: Secondary | ICD-10-CM

## 2018-01-13 NOTE — Progress Notes (Signed)
Post-Op Visit Note   Patient: Troy [name]           Date of Birth: [birth year]           MRN: [id number] Visit Date: 01/13/2018 PCP: [name], MD   Assessment & Plan: Postop revision midfoot amputation by Dr. Ninfa Linden.  Patient's had increased serosanguineous drainage she still smoking, he has diabetes.  There is 8 mm separation along the medial aspect.  No cellulitis.  Betadine applied new dressing applied.  Prescription for Bactroban that they can apply to the incision.  Them appoint with Dr. Ninfa Linden.  We discussed avoiding smoking and keeping his foot elevated maximal amount above his heart to decrease the swelling and help with the early dehiscence.  Follow-up with Dr. Ninfa Linden on Monday.  Chief Complaint:  Chief Complaint  Patient presents with  . Left Foot - Wound Check   Visit Diagnoses:  1. Foot amputation status, left ([place])     Plan: Dressing changes, Bactroban, more elevation, stop smoking, work hard on diabetic control to keep his sugars below 180 and follow-up with Dr. Ninfa Linden on Monday. Follow-Up Instructions: No follow-ups on file.   Orders:  No orders of the defined types were placed in this encounter.  No orders of the defined types were placed in this encounter.   Imaging: No results found.  PMFS History: Patient Active Problem List   Diagnosis Date Noted  . Foot amputation status, left ([place]) 01/03/2018  . Status post left foot surgery 12/15/2017  . Diabetic ulcer of left foot associated with diabetes mellitus due to underlying condition ([place]) 12/07/2017  . Snoring 06/07/2016  . Chest pain 01/22/2016  . Abnormal nuclear stress test 01/22/2016  . COPD (chronic obstructive pulmonary disease) ([place]) 01/27/2011  . Pre-operative cardiovascular examination 01/27/2011  . Nonspecific abnormal electrocardiogram (ECG) (EKG) 01/27/2011  . Murmur 01/27/2011  . DM2 (diabetes mellitus, type 2) ([place])   . HTN (hypertension)   . HLD (hyperlipidemia)   .  Tobacco abuse    Past Medical History:  Diagnosis Date  . Acute renal failure ([place])    in setting of NSAID use and orthopedic surgery 2010  . Anxiety and depression   . Chronic diastolic CHF (congestive heart failure) ([place])    a. Echo 6/17: severe conc LVH, vigorous EF, EF 65-70%, no dynamic obstruction, no RWMA, Gr 1 DD, mild TR  //  b. LHC 8/17: no sig CAD, LVEDP 28  . COPD (chronic obstructive pulmonary disease) ([place])   . Diabetic ulcer of left foot ([place])   . DM2 (diabetes mellitus, type 2) ([place])   . Family history of early CAD   . GERD (gastroesophageal reflux disease)   . History of amputation of foot ([place])    L trans-met // R toe  . History of cardiac catheterization    a. [place] 2002: irregs  //  b. LHC in 8/17: no sig CAD, apical DK, hyperdynamic LV, LVEDP 28  . History of nuclear stress test    a. Nuc 7/17: Overall, intermediate risk nuclear stress test secondary to small size of apical lateral defect and reduced ejection fraction.  EF 43%  . HLD (hyperlipidemia)   . HTN (hypertension)   . Injuries     crushing injury to both his feet in February 2010.   Marland Kitchen Kidney calculi   . Palpitations   . Tobacco abuse     Family History  Problem Relation Age of Onset  . Leukemia Mother 35  died  . Lung cancer Father 62       died  . Heart attack Brother 46  . Heart attack Brother 25  . Hypertension Brother        X3  . Hypertension Sister        X18  . Diabetes Sister   . Stroke Sister   . Diabetes Sister   . Other Brother        Musician accident    Past Surgical History:  Procedure Laterality Date  . AMPUTATION Left 01/03/2018   Procedure: LEFT MIDFOOT AMPUTATION/REVISION MIDAMPUTAION;  Surgeon: Mcarthur Rossetti, MD;  Location: [place];  Service: Orthopedics;  Laterality: Left;  . CARDIAC CATHETERIZATION N/A 01/22/2016   Procedure: Left Heart Cath and Coronary Angiography;  Surgeon: Peter M Martinique, MD;  Location: [place] [facility];  Service: Cardiovascular;   Laterality: N/A;  . FOOT AMPUTATION Bilateral   . I&D EXTREMITY Left 12/15/2017   Procedure: IRRIGATION AND DEBRIDEMENT LEFT FOOT ULCER;  Surgeon: Mcarthur Rossetti, MD;  Location: [facility];  Service: Orthopedics;  Laterality: Left;  . LITHOTRIPSY    . TENDON LENGTHENING Bilateral    calf  . TONSILLECTOMY     Social History   Occupational History  . Occupation: disable  Tobacco Use  . Smoking status: Current Every Day Smoker    Packs/day: 0.50    Years: 40.00    Pack years: 20.00    Types: Cigarettes  . Smokeless tobacco: Never Used  Substance and Sexual Activity  . Alcohol use: No  . Drug use: No  . Sexual activity: Not on file

## 2018-01-16 ENCOUNTER — Ambulatory Visit (INDEPENDENT_AMBULATORY_CARE_PROVIDER_SITE_OTHER): Payer: Medicare HMO | Admitting: Orthopaedic Surgery

## 2018-01-16 ENCOUNTER — Encounter (INDEPENDENT_AMBULATORY_CARE_PROVIDER_SITE_OTHER): Payer: Self-pay | Admitting: Orthopaedic Surgery

## 2018-01-16 DIAGNOSIS — IMO0002 Reserved for concepts with insufficient information to code with codable children: Secondary | ICD-10-CM

## 2018-01-16 DIAGNOSIS — Z89432 Acquired absence of left foot: Secondary | ICD-10-CM

## 2018-01-16 NOTE — Progress Notes (Signed)
The patient is here status post a midfoot amputation that was significantly proximal.  On exam there is some dehiscence medially.  There is no significant odor to this.  He is on antibiotics.  I will have him soak this area and antibacterial soapy water daily and then dried off and place a small amount of Bactroban ointment.  Every other day he can try some Xeroform.  He will follow-up with Gordy Levan next week if there is any worrisome aspect of his wound or foot he will get Dr. Sharol Given involved.  The patient understands that he may end up with a below-knee amputation.  His blood glucose still is not under the best control.

## 2018-01-18 NOTE — Addendum Note (Signed)
Addendum  created 01/18/18 2153 by Roberts Gaudy, MD   Intraprocedure Staff edited

## 2018-01-23 ENCOUNTER — Ambulatory Visit (INDEPENDENT_AMBULATORY_CARE_PROVIDER_SITE_OTHER): Payer: Medicare HMO | Admitting: Physician Assistant

## 2018-01-23 ENCOUNTER — Encounter (INDEPENDENT_AMBULATORY_CARE_PROVIDER_SITE_OTHER): Payer: Self-pay | Admitting: Physician Assistant

## 2018-01-23 DIAGNOSIS — IMO0002 Reserved for concepts with insufficient information to code with codable children: Secondary | ICD-10-CM

## 2018-01-23 DIAGNOSIS — Z89432 Acquired absence of left foot: Secondary | ICD-10-CM

## 2018-01-23 NOTE — Progress Notes (Signed)
HPI: Troy [name] returns today follow-up of his midfoot amputation of his left foot.  His wife states that his wound is no better at this point and is now almost 3 weeks status post surgery.  Having drainage or bleeding.  He states that sugars are approximately 150 where he wakes up.  His last hemoglobin A1c was 11.0 and this was on 12/13/2017.  Denies any fevers chills shortness of breath or chest pain.  Left foot: Sutures remained intact and midfoot incision line.  Dehiscence of the medial aspect of the incision.  There is slight odo  No purulent drainage.  There is serosanguineous drainage though.  Dorsal pedal pulses palpable posterior tibial pulses also palpable.  Impression: 3 weeks status post midfoot amputation  Plan: Due to the fact that his wound is not healing had Dr. do to look at this.  Dr. due to recommended a below-knee amputation.  Patient is agreeable with this.  He will proceed with a below-knee amputation left leg Dr. due to later this week.  Discussion with him stopping all smoking and trying to better control his glucose levels as discussed with the patient.  Dr. it also discussed with him taking some [place] EY powder and drinking his daily.  He will follow with Dr. due to postop in approximately 2 weeks.  Questions are encouraged and answered by Dr. due to myself.

## 2018-01-24 ENCOUNTER — Encounter (HOSPITAL_COMMUNITY): Payer: Self-pay | Admitting: *Deleted

## 2018-01-24 ENCOUNTER — Other Ambulatory Visit: Payer: Self-pay

## 2018-01-24 ENCOUNTER — Other Ambulatory Visit (INDEPENDENT_AMBULATORY_CARE_PROVIDER_SITE_OTHER): Payer: Self-pay | Admitting: Orthopedic Surgery

## 2018-01-24 DIAGNOSIS — T8131XD Disruption of external operation (surgical) wound, not elsewhere classified, subsequent encounter: Secondary | ICD-10-CM

## 2018-01-24 NOTE — Progress Notes (Signed)
Spoke with pt's wife, Marcie Bal for pre-op call. DPR on file. She states pt has hx of CHF and sees Dr. [name]. Pt is a type 2 Diabetic, last A1C was 11.0 on 12/13/17. Marcie Bal states pt has an appt with his endocrinologist sometime this month. Nothing has been changed on his medications. She states his fasting blood sugar today was 156, pt does not check it regularly. Instructed Marcie Bal to have pt not take any of his oral diabetic medications in the AM and have him not take his Trulicity tomorrow. Instructed her to have pt check his blood sugar in the AM when he gets up and every 2 hours until he leaves for the hospital. If blood sugar is 70 or below, treat with 1/2 cup of clear juice (apple or cranberry) and recheck blood sugar 15 minutes after drinking juice. If blood sugar continues to be 70 or below, call the Short Stay department and ask to speak to a nurse. Marcie Bal voiced understanding.

## 2018-01-25 ENCOUNTER — Inpatient Hospital Stay (HOSPITAL_COMMUNITY)
Admission: RE | Admit: 2018-01-25 | Discharge: 2018-01-31 | DRG: 475 | Disposition: A | Discharge status: Home Health | Payer: Medicare HMO | Attending: Orthopedic Surgery | Admitting: Orthopedic Surgery

## 2018-01-25 ENCOUNTER — Inpatient Hospital Stay (HOSPITAL_COMMUNITY): Payer: Medicare HMO | Admitting: Anesthesiology

## 2018-01-25 ENCOUNTER — Encounter (HOSPITAL_COMMUNITY): Payer: Self-pay

## 2018-01-25 ENCOUNTER — Other Ambulatory Visit: Payer: Self-pay

## 2018-01-25 ENCOUNTER — Encounter (HOSPITAL_COMMUNITY)
Admission: RE | Disposition: A | Discharge status: Home Health | Payer: Self-pay | Source: Home / Self Care | Attending: Orthopedic Surgery

## 2018-01-25 DIAGNOSIS — Z833 Family history of diabetes mellitus: Secondary | ICD-10-CM | POA: Diagnosis not present

## 2018-01-25 DIAGNOSIS — Z87442 Personal history of urinary calculi: Secondary | ICD-10-CM | POA: Diagnosis not present

## 2018-01-25 DIAGNOSIS — K219 Gastro-esophageal reflux disease without esophagitis: Secondary | ICD-10-CM | POA: Diagnosis present

## 2018-01-25 DIAGNOSIS — I96 Gangrene, not elsewhere classified: Secondary | ICD-10-CM | POA: Diagnosis present

## 2018-01-25 DIAGNOSIS — IMO0002 Reserved for concepts with insufficient information to code with codable children: Secondary | ICD-10-CM

## 2018-01-25 DIAGNOSIS — K760 Fatty (change of) liver, not elsewhere classified: Secondary | ICD-10-CM | POA: Diagnosis present

## 2018-01-25 DIAGNOSIS — E785 Hyperlipidemia, unspecified: Secondary | ICD-10-CM | POA: Diagnosis present

## 2018-01-25 DIAGNOSIS — Z87892 Personal history of anaphylaxis: Secondary | ICD-10-CM

## 2018-01-25 DIAGNOSIS — Z89421 Acquired absence of other right toe(s): Secondary | ICD-10-CM

## 2018-01-25 DIAGNOSIS — Z89432 Acquired absence of left foot: Secondary | ICD-10-CM

## 2018-01-25 DIAGNOSIS — I110 Hypertensive heart disease with heart failure: Secondary | ICD-10-CM | POA: Diagnosis present

## 2018-01-25 DIAGNOSIS — F17210 Nicotine dependence, cigarettes, uncomplicated: Secondary | ICD-10-CM | POA: Diagnosis present

## 2018-01-25 DIAGNOSIS — T8131XD Disruption of external operation (surgical) wound, not elsewhere classified, subsequent encounter: Secondary | ICD-10-CM

## 2018-01-25 DIAGNOSIS — Z7984 Long term (current) use of oral hypoglycemic drugs: Secondary | ICD-10-CM | POA: Diagnosis not present

## 2018-01-25 DIAGNOSIS — Z79899 Other long term (current) drug therapy: Secondary | ICD-10-CM

## 2018-01-25 DIAGNOSIS — J449 Chronic obstructive pulmonary disease, unspecified: Secondary | ICD-10-CM | POA: Diagnosis present

## 2018-01-25 DIAGNOSIS — E1140 Type 2 diabetes mellitus with diabetic neuropathy, unspecified: Secondary | ICD-10-CM | POA: Diagnosis present

## 2018-01-25 DIAGNOSIS — Z801 Family history of malignant neoplasm of trachea, bronchus and lung: Secondary | ICD-10-CM

## 2018-01-25 DIAGNOSIS — Z888 Allergy status to other drugs, medicaments and biological substances status: Secondary | ICD-10-CM | POA: Diagnosis not present

## 2018-01-25 DIAGNOSIS — Z806 Family history of leukemia: Secondary | ICD-10-CM

## 2018-01-25 DIAGNOSIS — Z91048 Other nonmedicinal substance allergy status: Secondary | ICD-10-CM

## 2018-01-25 DIAGNOSIS — Z89512 Acquired absence of left leg below knee: Secondary | ICD-10-CM | POA: Diagnosis not present

## 2018-01-25 DIAGNOSIS — T8131XA Disruption of external operation (surgical) wound, not elsewhere classified, initial encounter: Secondary | ICD-10-CM

## 2018-01-25 DIAGNOSIS — E1152 Type 2 diabetes mellitus with diabetic peripheral angiopathy with gangrene: Secondary | ICD-10-CM | POA: Diagnosis present

## 2018-01-25 DIAGNOSIS — I5032 Chronic diastolic (congestive) heart failure: Secondary | ICD-10-CM | POA: Diagnosis present

## 2018-01-25 DIAGNOSIS — Z7982 Long term (current) use of aspirin: Secondary | ICD-10-CM | POA: Diagnosis not present

## 2018-01-25 DIAGNOSIS — T8781 Dehiscence of amputation stump: Principal | ICD-10-CM

## 2018-01-25 DIAGNOSIS — Z8249 Family history of ischemic heart disease and other diseases of the circulatory system: Secondary | ICD-10-CM

## 2018-01-25 DIAGNOSIS — I739 Peripheral vascular disease, unspecified: Secondary | ICD-10-CM | POA: Diagnosis not present

## 2018-01-25 HISTORY — PX: BELOW KNEE LEG AMPUTATION: SUR23

## 2018-01-25 HISTORY — DX: Fatty (change of) liver, not elsewhere classified: K76.0

## 2018-01-25 HISTORY — DX: Personal history of urinary calculi: Z87.442

## 2018-01-25 HISTORY — PX: AMPUTATION: SHX166

## 2018-01-25 LAB — CBC
HCT: 39.3 % (ref 39.0–52.0)
Hemoglobin: 12.8 g/dL — ABNORMAL LOW (ref 13.0–17.0)
MCH: 28.1 pg (ref 26.0–34.0)
MCHC: 32.6 g/dL (ref 30.0–36.0)
MCV: 86.4 fL (ref 78.0–100.0)
Platelets: 169 10*3/uL (ref 150–400)
RBC: 4.55 MIL/uL (ref 4.22–5.81)
RDW: 13.6 % (ref 11.5–15.5)
WBC: 9.1 10*3/uL (ref 4.0–10.5)

## 2018-01-25 LAB — BASIC METABOLIC PANEL
ANION GAP: 12 (ref 5–15)
BUN: 7 mg/dL (ref 6–20)
CALCIUM: 8.5 mg/dL — AB (ref 8.9–10.3)
CO2: 24 mmol/L (ref 22–32)
CREATININE: 0.57 mg/dL — AB (ref 0.61–1.24)
Chloride: 100 mmol/L (ref 98–111)
GFR calc Af Amer: >60 mL/min (ref >60)
Glucose, Bld: 136 mg/dL — ABNORMAL HIGH (ref 70–99)
Potassium: 4 mmol/L (ref 3.5–5.1)
SODIUM: 136 mmol/L (ref 135–145)

## 2018-01-25 LAB — GLUCOSE, CAPILLARY
GLUCOSE-CAPILLARY: 117 mg/dL — AB (ref 70–99)
Glucose-Capillary: 136 mg/dL — ABNORMAL HIGH (ref 70–99)
Glucose-Capillary: 178 mg/dL — ABNORMAL HIGH (ref 70–99)
Glucose-Capillary: 169 mg/dL — ABNORMAL HIGH (ref 70–99)

## 2018-01-25 SURGERY — AMPUTATION BELOW KNEE
Anesthesia: Monitor Anesthesia Care | Laterality: Left

## 2018-01-25 MED ORDER — GLIPIZIDE ER 10 MG PO TB24
10.0000 mg | ORAL_TABLET | Freq: Every day | ORAL | Status: DC
  Administered 2018-01-26 – 2018-01-31 (×6): 10 mg via ORAL
  Filled 2018-01-25 (×6): qty 1

## 2018-01-25 MED ORDER — LACTATED RINGERS IV SOLN
INTRAVENOUS | Status: DC | PRN
  Administered 2018-01-25: 14:00:00 via INTRAVENOUS

## 2018-01-25 MED ORDER — BISACODYL 10 MG RE SUPP: 10.0000 mg | Freq: Every day | RECTAL | Status: DC | PRN

## 2018-01-25 MED ORDER — CHLORHEXIDINE GLUCONATE 4 % EX LIQD: 60.0000 mL | Freq: Once | CUTANEOUS | Status: DC

## 2018-01-25 MED ORDER — DOCUSATE SODIUM 100 MG PO CAPS
100.0000 mg | ORAL_CAPSULE | Freq: Two times a day (BID) | ORAL | Status: DC
  Administered 2018-01-25 – 2018-01-31 (×10): 100 mg via ORAL
  Filled 2018-01-25 (×12): qty 1

## 2018-01-25 MED ORDER — FENTANYL CITRATE (PF) 100 MCG/2ML IJ SOLN
INTRAMUSCULAR | Status: AC
  Administered 2018-01-25: 100 ug via INTRAVENOUS
  Filled 2018-01-25: qty 2

## 2018-01-25 MED ORDER — ONDANSETRON HCL 4 MG/2ML IJ SOLN
INTRAMUSCULAR | Status: DC | PRN
  Administered 2018-01-25: 4 mg via INTRAVENOUS

## 2018-01-25 MED ORDER — POLYETHYLENE GLYCOL 3350 17 G PO PACK
17.0000 g | PACK | Freq: Every day | ORAL | Status: DC | PRN
  Administered 2018-01-29: 17 g via ORAL
  Filled 2018-01-25: qty 1

## 2018-01-25 MED ORDER — CANAGLIFLOZIN 100 MG PO TABS
100.0000 mg | ORAL_TABLET | Freq: Every day | ORAL | Status: DC
  Administered 2018-01-26 – 2018-01-31 (×6): 100 mg via ORAL
  Filled 2018-01-25 (×6): qty 1

## 2018-01-25 MED ORDER — METOCLOPRAMIDE HCL 5 MG PO TABS: 5.0000 mg | ORAL_TABLET | Freq: Three times a day (TID) | ORAL | Status: DC | PRN

## 2018-01-25 MED ORDER — METHOCARBAMOL 500 MG PO TABS
ORAL_TABLET | ORAL | Status: AC
  Filled 2018-01-25: qty 1

## 2018-01-25 MED ORDER — LOSARTAN POTASSIUM 50 MG PO TABS
50.0000 mg | ORAL_TABLET | Freq: Every day | ORAL | Status: DC
  Administered 2018-01-26 – 2018-01-31 (×6): 50 mg via ORAL
  Filled 2018-01-25 (×6): qty 1

## 2018-01-25 MED ORDER — DULAGLUTIDE 1.5 MG/0.5ML ~~LOC~~ SOAJ: 1.5000 mg | SUBCUTANEOUS | Status: DC

## 2018-01-25 MED ORDER — OXYCODONE HCL 5 MG PO TABS
10.0000 mg | ORAL_TABLET | ORAL | Status: DC | PRN
  Administered 2018-01-26 – 2018-01-31 (×24): 15 mg via ORAL
  Filled 2018-01-25 (×5): qty 3
  Filled 2018-01-25: qty 2
  Filled 2018-01-25 (×19): qty 3

## 2018-01-25 MED ORDER — METOPROLOL TARTRATE 5 MG/5ML IV SOLN
INTRAVENOUS | Status: DC | PRN
  Administered 2018-01-25: 1 mg via INTRAVENOUS

## 2018-01-25 MED ORDER — MAGNESIUM CITRATE PO SOLN
1.0000 | Freq: Once | ORAL | Status: AC | PRN
  Administered 2018-01-29: 1 via ORAL
  Filled 2018-01-25: qty 296

## 2018-01-25 MED ORDER — INSULIN ASPART 100 UNIT/ML ~~LOC~~ SOLN
0.0000 [IU] | Freq: Three times a day (TID) | SUBCUTANEOUS | Status: DC
  Administered 2018-01-25: 3 [IU] via SUBCUTANEOUS
  Administered 2018-01-26 (×2): 2 [IU] via SUBCUTANEOUS
  Administered 2018-01-26: 3 [IU] via SUBCUTANEOUS
  Administered 2018-01-27 (×3): 2 [IU] via SUBCUTANEOUS
  Administered 2018-01-28: 1 [IU] via SUBCUTANEOUS
  Administered 2018-01-28 – 2018-01-31 (×6): 2 [IU] via SUBCUTANEOUS

## 2018-01-25 MED ORDER — GABAPENTIN 300 MG PO CAPS
300.0000 mg | ORAL_CAPSULE | Freq: Three times a day (TID) | ORAL | Status: DC
  Administered 2018-01-25 – 2018-01-31 (×18): 300 mg via ORAL
  Filled 2018-01-25 (×18): qty 1

## 2018-01-25 MED ORDER — OXYCODONE HCL 5 MG PO TABS
5.0000 mg | ORAL_TABLET | ORAL | Status: DC | PRN
  Administered 2018-01-25: 10 mg via ORAL
  Administered 2018-01-25: 5 mg via ORAL
  Administered 2018-01-25 – 2018-01-28 (×2): 10 mg via ORAL
  Filled 2018-01-25 (×2): qty 2

## 2018-01-25 MED ORDER — AMITRIPTYLINE HCL 50 MG PO TABS
150.0000 mg | ORAL_TABLET | Freq: Every day | ORAL | Status: DC
  Administered 2018-01-25 – 2018-01-30 (×6): 150 mg via ORAL
  Filled 2018-01-25 (×6): qty 3

## 2018-01-25 MED ORDER — FENTANYL CITRATE (PF) 100 MCG/2ML IJ SOLN: 25.0000 ug | INTRAMUSCULAR | Status: DC | PRN

## 2018-01-25 MED ORDER — PHENYLEPHRINE 40 MCG/ML (10ML) SYRINGE FOR IV PUSH (FOR BLOOD PRESSURE SUPPORT)
PREFILLED_SYRINGE | INTRAVENOUS | Status: DC | PRN
  Administered 2018-01-25: 80 ug via INTRAVENOUS

## 2018-01-25 MED ORDER — ROPIVACAINE HCL 7.5 MG/ML IJ SOLN
INTRAMUSCULAR | Status: DC | PRN
  Administered 2018-01-25: 20 mL via PERINEURAL

## 2018-01-25 MED ORDER — LIDOCAINE 2% (20 MG/ML) 5 ML SYRINGE
INTRAMUSCULAR | Status: DC | PRN
  Administered 2018-01-25: 60 mg via INTRAVENOUS

## 2018-01-25 MED ORDER — METOCLOPRAMIDE HCL 5 MG/ML IJ SOLN: 5.0000 mg | Freq: Three times a day (TID) | INTRAMUSCULAR | Status: DC | PRN

## 2018-01-25 MED ORDER — FENTANYL CITRATE (PF) 100 MCG/2ML IJ SOLN
100.0000 ug | Freq: Once | INTRAMUSCULAR | Status: AC
  Administered 2018-01-25: 100 ug via INTRAVENOUS

## 2018-01-25 MED ORDER — MIDAZOLAM HCL 2 MG/2ML IJ SOLN
2.0000 mg | Freq: Once | INTRAMUSCULAR | Status: AC
  Administered 2018-01-25: 2 mg via INTRAVENOUS

## 2018-01-25 MED ORDER — DEXMEDETOMIDINE HCL 200 MCG/2ML IV SOLN
INTRAVENOUS | Status: DC | PRN
  Administered 2018-01-25: 8 ug via INTRAVENOUS
  Administered 2018-01-25: 4 ug via INTRAVENOUS
  Administered 2018-01-25: 8 ug via INTRAVENOUS

## 2018-01-25 MED ORDER — METOPROLOL SUCCINATE ER 50 MG PO TB24: 50.0000 mg | ORAL_TABLET | ORAL | Status: DC

## 2018-01-25 MED ORDER — MORPHINE SULFATE (PF) 2 MG/ML IV SOLN
2.0000 mg | INTRAVENOUS | Status: DC | PRN
  Administered 2018-01-25 – 2018-01-26 (×5): 2 mg via INTRAVENOUS
  Filled 2018-01-25 (×5): qty 1

## 2018-01-25 MED ORDER — ACETAMINOPHEN 325 MG PO TABS
325.0000 mg | ORAL_TABLET | Freq: Four times a day (QID) | ORAL | Status: DC | PRN
  Administered 2018-01-27 – 2018-01-28 (×2): 650 mg via ORAL
  Filled 2018-01-25 (×3): qty 2

## 2018-01-25 MED ORDER — PANTOPRAZOLE SODIUM 40 MG PO TBEC
40.0000 mg | DELAYED_RELEASE_TABLET | Freq: Every day | ORAL | Status: DC
  Administered 2018-01-26 – 2018-01-31 (×6): 40 mg via ORAL
  Filled 2018-01-25 (×6): qty 1

## 2018-01-25 MED ORDER — METOPROLOL SUCCINATE ER 100 MG PO TB24
100.0000 mg | ORAL_TABLET | Freq: Every day | ORAL | Status: DC
  Administered 2018-01-26 – 2018-01-31 (×5): 100 mg via ORAL
  Filled 2018-01-25 (×7): qty 1

## 2018-01-25 MED ORDER — SODIUM CHLORIDE 0.9 % IV SOLN
INTRAVENOUS | Status: DC
  Administered 2018-01-25: 16:00:00 via INTRAVENOUS

## 2018-01-25 MED ORDER — METHOCARBAMOL 1000 MG/10ML IJ SOLN
500.0000 mg | Freq: Four times a day (QID) | INTRAVENOUS | Status: DC | PRN
  Filled 2018-01-25: qty 5

## 2018-01-25 MED ORDER — CEFAZOLIN SODIUM-DEXTROSE 2-4 GM/100ML-% IV SOLN
2.0000 g | INTRAVENOUS | Status: AC
  Administered 2018-01-25: 2 g via INTRAVENOUS
  Filled 2018-01-25: qty 100

## 2018-01-25 MED ORDER — METFORMIN HCL ER 500 MG PO TB24
1000.0000 mg | ORAL_TABLET | Freq: Two times a day (BID) | ORAL | Status: DC
  Administered 2018-01-25 – 2018-01-31 (×12): 1000 mg via ORAL
  Filled 2018-01-25 (×13): qty 2

## 2018-01-25 MED ORDER — TRAZODONE HCL 50 MG PO TABS
50.0000 mg | ORAL_TABLET | Freq: Every day | ORAL | Status: DC
  Administered 2018-01-25 – 2018-01-30 (×6): 50 mg via ORAL
  Filled 2018-01-25 (×6): qty 1

## 2018-01-25 MED ORDER — ONDANSETRON HCL 4 MG PO TABS: 4.0000 mg | ORAL_TABLET | Freq: Four times a day (QID) | ORAL | Status: DC | PRN

## 2018-01-25 MED ORDER — LIDOCAINE-EPINEPHRINE (PF) 1.5 %-1:200000 IJ SOLN
INTRAMUSCULAR | Status: DC | PRN
  Administered 2018-01-25: 5 mL via PERINEURAL

## 2018-01-25 MED ORDER — SIMVASTATIN 40 MG PO TABS
40.0000 mg | ORAL_TABLET | Freq: Every evening | ORAL | Status: DC
  Administered 2018-01-25 – 2018-01-30 (×6): 40 mg via ORAL
  Filled 2018-01-25 (×6): qty 1

## 2018-01-25 MED ORDER — IPRATROPIUM BROMIDE 0.02 % IN SOLN: 0.5000 mg | Freq: Four times a day (QID) | RESPIRATORY_TRACT | Status: DC | PRN

## 2018-01-25 MED ORDER — METOPROLOL SUCCINATE ER 50 MG PO TB24
50.0000 mg | ORAL_TABLET | Freq: Every day | ORAL | Status: DC
  Administered 2018-01-25 – 2018-01-30 (×6): 50 mg via ORAL
  Filled 2018-01-25 (×5): qty 1

## 2018-01-25 MED ORDER — OXYCODONE HCL 5 MG PO TABS
ORAL_TABLET | ORAL | Status: AC
  Administered 2018-01-25: 5 mg via ORAL
  Filled 2018-01-25: qty 1

## 2018-01-25 MED ORDER — MIDAZOLAM HCL 2 MG/2ML IJ SOLN
INTRAMUSCULAR | Status: AC
  Administered 2018-01-25: 2 mg via INTRAVENOUS
  Filled 2018-01-25: qty 2

## 2018-01-25 MED ORDER — PROPOFOL 10 MG/ML IV BOLUS
INTRAVENOUS | Status: DC | PRN
  Administered 2018-01-25 (×2): 20 mg via INTRAVENOUS

## 2018-01-25 MED ORDER — CEFAZOLIN SODIUM-DEXTROSE 1-4 GM/50ML-% IV SOLN
1.0000 g | Freq: Four times a day (QID) | INTRAVENOUS | Status: AC
  Administered 2018-01-25 – 2018-01-26 (×3): 1 g via INTRAVENOUS
  Filled 2018-01-25 (×4): qty 50

## 2018-01-25 MED ORDER — ASPIRIN EC 81 MG PO TBEC
81.0000 mg | DELAYED_RELEASE_TABLET | Freq: Every day | ORAL | Status: DC
  Administered 2018-01-26 – 2018-01-31 (×6): 81 mg via ORAL
  Filled 2018-01-25 (×6): qty 1

## 2018-01-25 MED ORDER — METHOCARBAMOL 500 MG PO TABS
500.0000 mg | ORAL_TABLET | Freq: Four times a day (QID) | ORAL | Status: DC | PRN
  Administered 2018-01-25 – 2018-01-31 (×13): 500 mg via ORAL
  Filled 2018-01-25 (×12): qty 1

## 2018-01-25 MED ORDER — 0.9 % SODIUM CHLORIDE (POUR BTL) OPTIME
TOPICAL | Status: DC | PRN
Start: 2018-01-25 — End: 2018-01-25
  Administered 2018-01-25: 1000 mL

## 2018-01-25 MED ORDER — ONDANSETRON HCL 4 MG/2ML IJ SOLN: 4.0000 mg | Freq: Four times a day (QID) | INTRAMUSCULAR | Status: DC | PRN

## 2018-01-25 MED ORDER — BUPIVACAINE-EPINEPHRINE (PF) 0.5% -1:200000 IJ SOLN
INTRAMUSCULAR | Status: DC | PRN
  Administered 2018-01-25: 25 mL via PERINEURAL

## 2018-01-25 MED ORDER — PROPOFOL 500 MG/50ML IV EMUL
INTRAVENOUS | Status: DC | PRN
  Administered 2018-01-25: 100 ug/kg/min via INTRAVENOUS

## 2018-01-25 SURGICAL SUPPLY — 38 items
APL SKNCLS STERI-STRIP NONHPOA (GAUZE/BANDAGES/DRESSINGS) ×1
BENZOIN TINCTURE PRP APPL 2/3 (GAUZE/BANDAGES/DRESSINGS) ×6 IMPLANT
BLADE SAW RECIP 87.9 MT (BLADE) ×3 IMPLANT
BLADE SURG 21 STRL SS (BLADE) ×3 IMPLANT
BNDG COHESIVE 6X5 TAN STRL LF (GAUZE/BANDAGES/DRESSINGS) ×6 IMPLANT
BNDG GAUZE ELAST 4 BULKY (GAUZE/BANDAGES/DRESSINGS) ×6 IMPLANT
COVER SURGICAL LIGHT HANDLE (MISCELLANEOUS) ×3 IMPLANT
CUFF TOURNIQUET SINGLE 34IN LL (TOURNIQUET CUFF) IMPLANT
CUFF TOURNIQUET SINGLE 44IN (TOURNIQUET CUFF) IMPLANT
DRAPE INCISE IOBAN 66X45 STRL (DRAPES) IMPLANT
DRAPE U-SHAPE 47X51 STRL (DRAPES) ×3 IMPLANT
DRESSING PREVENA PLUS CUSTOM (GAUZE/BANDAGES/DRESSINGS) ×1 IMPLANT
DRSG PREVENA PLUS CUSTOM (GAUZE/BANDAGES/DRESSINGS) ×3
DURAPREP 26ML APPLICATOR (WOUND CARE) ×3 IMPLANT
ELECT REM PT RETURN 9FT ADLT (ELECTROSURGICAL) ×3
ELECTRODE REM PT RTRN 9FT ADLT (ELECTROSURGICAL) ×1 IMPLANT
GLOVE BIOGEL PI IND STRL 9 (GLOVE) ×1 IMPLANT
GLOVE BIOGEL PI INDICATOR 9 (GLOVE) ×2
GLOVE SURG ORTHO 9.0 STRL STRW (GLOVE) ×3 IMPLANT
GOWN STRL REUS W/ TWL XL LVL3 (GOWN DISPOSABLE) ×2 IMPLANT
GOWN STRL REUS W/TWL XL LVL3 (GOWN DISPOSABLE) ×6
KIT BASIN OR (CUSTOM PROCEDURE TRAY) ×3 IMPLANT
KIT TURNOVER KIT B (KITS) ×3 IMPLANT
MANIFOLD NEPTUNE II (INSTRUMENTS) ×3 IMPLANT
NS IRRIG 1000ML POUR BTL (IV SOLUTION) ×3 IMPLANT
PACK ORTHO EXTREMITY (CUSTOM PROCEDURE TRAY) ×3 IMPLANT
PAD ARMBOARD 7.5X6 YLW CONV (MISCELLANEOUS) ×3 IMPLANT
PREVENA RESTOR ARTHOFORM 46X30 (CANNISTER) ×2 IMPLANT
SPONGE LAP 18X18 X RAY DECT (DISPOSABLE) IMPLANT
STAPLER VISISTAT 35W (STAPLE) IMPLANT
STOCKINETTE IMPERVIOUS LG (DRAPES) ×3 IMPLANT
SUT SILK 2 0 (SUTURE) ×3
SUT SILK 2-0 18XBRD TIE 12 (SUTURE) ×1 IMPLANT
SUT VIC AB 1 CTX 27 (SUTURE) IMPLANT
TOWEL OR 17X26 10 PK STRL BLUE (TOWEL DISPOSABLE) ×3 IMPLANT
TUBE CONNECTING 12'X1/4 (SUCTIONS) ×1
TUBE CONNECTING 12X1/4 (SUCTIONS) ×2 IMPLANT
YANKAUER SUCT BULB TIP NO VENT (SUCTIONS) ×3 IMPLANT

## 2018-01-25 NOTE — H&P (Signed)
Troy [name] is an 57 y.o. male.   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  Patient presents with progressive wound dehiscence.  Patient's most recent hemoglobin A1c was 11.  Patient denies any fever chills or shortness of breath at this time.  Past Medical History:  Diagnosis Date  . Acute renal failure ([place])    in setting of NSAID use and orthopedic surgery 2010  . Anxiety and depression   . Chronic diastolic CHF (congestive heart failure) ([place])    a. Echo 6/17: severe conc LVH, vigorous EF, EF 65-70%, no dynamic obstruction, no RWMA, Gr 1 DD, mild TR  //  b. LHC 8/17: no sig CAD, LVEDP 28  . COPD (chronic obstructive pulmonary disease) ([place])   . Diabetic ulcer of left foot ([place])   . DM2 (diabetes mellitus, type 2) ([place])   . Family history of early CAD   . Fatty liver   . GERD (gastroesophageal reflux disease)   . History of amputation of foot ([place])    L trans-met // R toe  . History of cardiac catheterization    a. [place] 2002: irregs  //  b. LHC in 8/17: no sig CAD, apical DK, hyperdynamic LV, LVEDP 28  . History of kidney stones   . History of nuclear stress test    a. Nuc 7/17: Overall, intermediate risk nuclear stress test secondary to small size of apical lateral defect and reduced ejection fraction.  EF 43%  . HLD (hyperlipidemia)   . HTN (hypertension)   . Injuries     crushing injury to both his feet in February 2010.   Marland Kitchen Kidney calculi   . Palpitations   . Tobacco abuse     Past Surgical History:  Procedure Laterality Date  . AMPUTATION Left 01/03/2018   Procedure: LEFT MIDFOOT AMPUTATION/REVISION MIDAMPUTAION;  Surgeon: Mcarthur Rossetti, MD;  Location: [facility];  Service: Orthopedics;  Laterality: Left;  . CARDIAC CATHETERIZATION N/A 01/22/2016   Procedure: Left Heart Cath and Coronary Angiography;  Surgeon:  Peter M Martinique, MD;  Location: [facility];  Service: Cardiovascular;  Laterality: N/A;  . FOOT AMPUTATION Bilateral   . I&D EXTREMITY Left 12/15/2017   Procedure: IRRIGATION AND DEBRIDEMENT LEFT FOOT ULCER;  Surgeon: Mcarthur Rossetti, MD;  Location: [facility];  Service: Orthopedics;  Laterality: Left;  . LITHOTRIPSY    . TENDON LENGTHENING Bilateral    calf  . TONSILLECTOMY      Family History  Problem Relation Age of Onset  . Leukemia Mother 65       died  . Lung cancer Father 18       died  . Heart attack Brother 77  . Heart attack Brother 61  . Hypertension Brother        X3  . Hypertension Sister        X11  . Diabetes Sister   . Stroke Sister   . Diabetes Sister   . Other Brother        Musician accident   Social History:  reports that he has been smoking cigarettes.  He has a 20.00 pack-year smoking history. He has never used smokeless tobacco. He reports that he does not drink alcohol or use drugs.  Allergies:  Allergies  Allergen Reactions  . Hydromorphone Hcl Er Anaphylaxis and Other (See Comments)    Allergic  to DYE in extended-release tablet, can tolerate other forms of hydromorphone  . Tapentadol Swelling and Other (See Comments)    THROAT ANGIOEDEMA Nucynta [Tapentadol Hydrochloride]  . Exalamide Other (See Comments)    UNSPECIFIED REACTION     No medications prior to admission.    No results found for this or any previous visit (from the past 48 hour(s)). No results found.  Review of Systems  All other systems reviewed and are negative.   There were no vitals taken for this visit. Physical Exam  On examination patient is alert oriented no adenopathy well-dressed normal affect normal respiratory rate.  Patient has a palpable dorsalis pedis pulse he has progressive dehiscence with exposed bone over the midfoot amputation. Assessment/Plan Assessment: Diabetic insensate neuropathy with peripheral vascular disease with progressive wound  dehiscence with gangrenous changes of the midfoot amputation.  Plan: Discussed with the patient that since he is a rather large person a further foot salvage intervention would not provide him a good foot to walk on.  Feel that his best option is to proceed with a transtibial amputation this should be durable for his body mass.  Risks and benefits were discussed including infection nonhealing the wound need for additional surgery.  Patient states he understands wishes to proceed at this time.  [name], MD 01/25/2018, 8:02 AM

## 2018-01-25 NOTE — Anesthesia Procedure Notes (Signed)
Anesthesia Regional Block: Popliteal block   Pre-Anesthetic Checklist: ,, timeout performed, Correct Patient, Correct Site, Correct Laterality, Correct Procedure, Correct Position, site marked, Risks and benefits discussed,  Surgical consent,  Pre-op evaluation,  At surgeon's request and post-op pain management  Laterality: Lower and Left  Prep: chloraprep       Needles:  Injection technique: Single-shot  Needle Type: Echogenic Stimulator Needle          Additional Needles:   Procedures:,,,, ultrasound used (permanent image in chart),,,,  Narrative:  Start time: 01/25/2018 1:01 PM End time: 01/25/2018 1:05 PM Injection made incrementally with aspirations every 5 mL.  Performed by: Personally  Anesthesiologist: Oleta Mouse, MD  Additional Notes: H+P and labs reviewed, risks and benefits discussed with patient, procedure tolerated well without complications

## 2018-01-25 NOTE — Anesthesia Preprocedure Evaluation (Signed)
Anesthesia Evaluation  Patient identified by MRN, date of birth, ID band Patient awake    Reviewed: Allergy & Precautions, NPO status , Patient's Chart, lab work & pertinent test results  History of Anesthesia Complications Negative for: history of anesthetic complications  Airway Mallampati: II  TM Distance: >3 FB Neck ROM: Full    Dental  (+) Caps, Dental Advisory Given, Upper Dentures   Pulmonary COPD,  COPD inhaler, Current Smoker, former smoker,    breath sounds clear to auscultation       Cardiovascular hypertension, Pt. on medications and Pt. on home beta blockers +CHF  + Valvular Problems/Murmurs  Rhythm:Regular Rate:Normal     Neuro/Psych PSYCHIATRIC DISORDERS Anxiety Depression    GI/Hepatic Neg liver ROS, GERD  Medicated and Controlled,  Endo/Other  diabetes, Poorly Controlled, Type 2, Oral Hypoglycemic AgentsHyperlipidemia  Renal/GU ARFRenal disease  negative genitourinary   Musculoskeletal Chronic open wound left foot Hx/o trans met amputation Hx/o Crush injury 2010   Abdominal (+) + obese,   Peds  Hematology negative hematology ROS (+)   Anesthesia Other Findings   Reproductive/Obstetrics                             Anesthesia Physical Anesthesia Plan  ASA: III  Anesthesia Plan: MAC and Regional   Post-op Pain Management:    Induction:   PONV Risk Score and Plan: 1 and Treatment may vary due to age or medical condition  Airway Management Planned: Nasal Cannula  Additional Equipment:   Intra-op Plan:   Post-operative Plan:   Informed Consent: I have reviewed the patients History and Physical, chart, labs and discussed the procedure including the risks, benefits and alternatives for the proposed anesthesia with the patient or authorized representative who has indicated his/her understanding and acceptance.   Dental advisory given  Plan Discussed with: CRNA and  Surgeon  Anesthesia Plan Comments:         Anesthesia Quick Evaluation

## 2018-01-25 NOTE — Plan of Care (Signed)
  Problem: Education: Goal: Knowledge of General Education information will improve Description Including pain rating scale, medication(s)/side effects and non-pharmacologic comfort measures Outcome: Progressing   Problem: Clinical Measurements: Goal: Ability to maintain clinical measurements within normal limits will improve Outcome: Progressing   Problem: Clinical Measurements: Goal: Will remain free from infection Outcome: Progressing   Problem: Activity: Goal: Risk for activity intolerance will decrease Outcome: Progressing   Problem: Pain Managment: Goal: General experience of comfort will improve Outcome: Progressing   Problem: Safety: Goal: Ability to remain free from injury will improve Outcome: Progressing   Problem: Skin Integrity: Goal: Risk for impaired skin integrity will decrease Outcome: Progressing   

## 2018-01-25 NOTE — Transfer of Care (Signed)
Immediate Anesthesia Transfer of Care Note  Patient: Troy [name]  Procedure(s) Performed: LEFT BELOW KNEE AMPUTATION (Left )  Patient Location: PACU  Anesthesia Type:MAC and Regional  Level of Consciousness: awake, alert  and oriented  Airway & Oxygen Therapy: Patient Spontanous Breathing  Post-op Assessment: Report given to RN and Post -op Vital signs reviewed and stable  Post vital signs: Reviewed and stable  Last Vitals:  Vitals Value Taken Time  BP 106/68 01/25/2018  2:21 PM  Temp    Pulse 109 01/25/2018  2:23 PM  Resp 18 01/25/2018  2:23 PM  SpO2 96 % 01/25/2018  2:23 PM  Vitals shown include unvalidated device data.  Last Pain:  Vitals:   01/25/18 1230  TempSrc: Oral  PainSc: 8       Patients Stated Pain Goal: 3 (53/[id number]/67 [id number])  Complications: No apparent anesthesia complications

## 2018-01-25 NOTE — Op Note (Signed)
   Date of Surgery: 01/25/2018  INDICATIONS: Troy [name] is a 57 y.o.-year-old male who presents status post foot salvage intervention with wound dehiscence of a transmetatarsal amputation.Marland Kitchen  PREOPERATIVE DIAGNOSIS: Dehiscence left transmetatarsal amputation  POSTOPERATIVE DIAGNOSIS: Same.  PROCEDURE: Transtibial amputation Application of Prevena wound VAC  SURGEON: Sharol Given, M.D.  ANESTHESIA:  general  IV FLUIDS AND URINE: See anesthesia.  ESTIMATED BLOOD LOSS: Minimal mL.  COMPLICATIONS: None.  DESCRIPTION OF PROCEDURE: The patient was brought to the operating room and underwent a general anesthetic. After adequate levels of anesthesia were obtained patient's lower extremity was prepped using DuraPrep draped into a sterile field. A timeout was called. The foot was draped out of the sterile field with impervious stockinette. A transverse incision was made 11 cm distal to the tibial tubercle. This curved proximally and a large posterior flap was created. The tibia was transected 1 cm proximal to the skin incision. The fibula was transected just proximal to the tibial incision. The tibia was beveled anteriorly. A large posterior flap was created. The sciatic nerve was pulled cut and allowed to retract. The vascular bundles were suture ligated with 2-0 silk. The deep and superficial fascial layers were closed using #1 Vicryl. The skin was closed using staples and 2-0 nylon. The wound was covered with a Prevena wound VAC. There was a good suction fit. A prosthetic shrinker was applied. Patient was extubated taken to the PACU in stable condition.   DISCHARGE PLANNING:  Antibiotic duration: 24 hours postoperatively  Weightbearing: Nonweightbearing on the left  Pain medication: High-dose opioid pathway ordered  Dressing care/ Wound VAC: Incisional wound VAC for 2 weeks  Discharge to: Skilled nursing facility.  Follow-up: In the office 1 week post operative.  Meridee Score, MD [place] 2:16 PM

## 2018-01-25 NOTE — Anesthesia Procedure Notes (Signed)
Anesthesia Regional Block: Femoral nerve block   Pre-Anesthetic Checklist: ,, timeout performed, Correct Patient, Correct Site, Correct Laterality, Correct Procedure, Correct Position, site marked, Risks and benefits discussed,  Surgical consent,  Pre-op evaluation,  At surgeon's request and post-op pain management  Laterality: Lower and Left  Prep: chloraprep       Needles:  Injection technique: Single-shot  Needle Type: Echogenic Stimulator Needle          Additional Needles:   Procedures:,,,, ultrasound used (permanent image in chart),,,,  Narrative:  Start time: 01/25/2018 12:56 PM End time: 01/25/2018 1:00 PM Injection made incrementally with aspirations every 5 mL.  Performed by: Personally  Anesthesiologist: Oleta Mouse, MD  Additional Notes: H+P and labs reviewed, risks and benefits discussed with patient, procedure tolerated well without complications

## 2018-01-26 ENCOUNTER — Encounter (HOSPITAL_COMMUNITY): Payer: Self-pay | Admitting: General Practice

## 2018-01-26 LAB — GLUCOSE, CAPILLARY
GLUCOSE-CAPILLARY: 163 mg/dL — AB (ref 70–99)
GLUCOSE-CAPILLARY: 141 mg/dL — AB (ref 70–99)
GLUCOSE-CAPILLARY: 127 mg/dL — AB (ref 70–99)
Glucose-Capillary: 127 mg/dL — ABNORMAL HIGH (ref 70–99)

## 2018-01-26 MED ORDER — HYDROMORPHONE HCL 1 MG/ML IJ SOLN
1.0000 mg | INTRAMUSCULAR | Status: DC | PRN
  Administered 2018-01-26 – 2018-01-30 (×17): 1 mg via INTRAVENOUS
  Filled 2018-01-26 (×18): qty 1

## 2018-01-26 NOTE — Progress Notes (Signed)
Orthopedic Tech Progress Note Patient Details:  Troy [name] 22-Oct-1960 [id number]  Patient ID: Troy [name], male   DOB: 11-Nov-1960, 57 y.o.   MRN: [id number]   Troy [name] 01/26/2018, 9:30 AM Called in bio-tech brace order; spoke with Bella Kennedy

## 2018-01-26 NOTE — Evaluation (Signed)
Physical Therapy Evaluation Patient Details Name: Troy [name] MRN: [id number] DOB: 09-Feb-1961 Today's Date: 01/26/2018   History of Present Illness  Pt is a 57 y/o male s/p L transtibial amputation. 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    Clinical Impression  Pt presented supine in bed with HOB elevated, awake and willing to participate in therapy session. Pt's spouse present throughout session as well. Prior to admission, pt reported that he was ambulating with use of RW and was independent with ADLs. Pt lives with his wife in a single level house with a ramped entrance. He currently requires min guard for bed mobility, min guard for transfers and min guard to ambulate a short distance within his room. Pt currently limited secondary to pain (reported 10/10). PT reviewed HEP therex and importance of knee extension with plans for obtaining a prosthesis in the future. Pt is an excellent candidate for CIR as he is highly motivated and has a supportive spouse. He demonstrates good potential to make some really great progress, and with further intensive therapies in the CIR setting he should be able to return home with spouse at a mod I level. PT will continue to follow patient acutely to progress mobility as tolerated.     Follow Up Recommendations CIR    Equipment Recommendations  None recommended by PT    Recommendations for Other Services Rehab consult     Precautions / Restrictions Precautions Precautions: Fall Restrictions Weight Bearing Restrictions: Yes LLE Weight Bearing: Non weight bearing      Mobility  Bed Mobility Overal bed mobility: Needs Assistance Bed Mobility: Supine to Sit     Supine to sit: Min guard     General bed mobility comments: pt required increased time and effort, HOB elevated, min guard for safety to achieve sitting EOB towards his L side  Transfers Overall transfer level: Needs  assistance Equipment used: Rolling walker (2 wheeled) Transfers: Sit to/from Stand Sit to Stand: Min guard         General transfer comment: pt required increased time and effort, bed in standard position, min guard for safety  Ambulation/Gait Ambulation/Gait assistance: Min guard Gait Distance (Feet): 15 Feet Assistive device: Rolling walker (2 wheeled) Gait Pattern/deviations: (hop-to on R LE) Gait velocity: decreased Gait velocity interpretation: <1.31 ft/sec, indicative of household ambulator General Gait Details: pt steady with RW using a hop-to gait pattern on R LE and min guard for safety  Stairs            Wheelchair Mobility    Modified Rankin (Stroke Patients Only)       Balance Overall balance assessment: Needs assistance Sitting-balance support: No upper extremity supported Sitting balance-Leahy Scale: Good     Standing balance support: During functional activity;Bilateral upper extremity supported Standing balance-Leahy Scale: Poor Standing balance comment: reliant on UE support for balance                             Pertinent Vitals/Pain Pain Assessment: 0-10 Pain Score: 10-Worst pain ever Pain Location: L residual limb Pain Descriptors / Indicators: Sore;Grimacing Pain Intervention(s): Monitored during session;Repositioned    Home Living Family/patient expects to be discharged to:: Private residence Living Arrangements: Spouse/significant other Available Help at Discharge: Family;Available 24 hours/day Type of Home: House Home Access: Ramped entrance     Home Layout: One level Home Equipment: Gilford Rile -  2 wheels;Wheelchair - manual      Prior Function Level of Independence: Independent with assistive device(s)         Comments: pt was previously ambulating with use of RW and independent with ADLs     Hand Dominance        Extremity/Trunk Assessment   Upper Extremity Assessment Upper Extremity Assessment: Overall  WFL for tasks assessed    Lower Extremity Assessment Lower Extremity Assessment: LLE deficits/detail LLE Deficits / Details: pt with wound VAC in place on distal aspect of residual limb. Pt with limited knee extension and knee flexion secondary to post-op pain and weakness. Pt limited to lacking ~45 degrees to neutral for active knee extension against gravity (passively could achieve more, to lacking ~20 degrees to neutral) LLE: Unable to fully assess due to pain    Cervical / Trunk Assessment Cervical / Trunk Assessment: Normal  Communication   Communication: No difficulties  Cognition Arousal/Alertness: Awake/alert Behavior During Therapy: Impulsive Overall Cognitive Status: Within Functional Limits for tasks assessed                                        General Comments      Exercises Other Exercises Other Exercises: discussed importance of knee extension and encouraged pt to rest with L LE with as much knee extension as possible Other Exercises: discussed HEP therex for pt to perform throughout the day including light sensory input to distal aspect of L residual limb, active knee flexion and active knee extension   Assessment/Plan    PT Assessment Patient needs continued PT services  PT Problem List Decreased strength;Decreased range of motion;Decreased activity tolerance;Decreased balance;Decreased mobility;Decreased coordination;Decreased knowledge of use of DME;Decreased safety awareness;Decreased knowledge of precautions;Pain       PT Treatment Interventions DME instruction;Gait training;Stair training;Functional mobility training;Therapeutic exercise;Therapeutic activities;Balance training;Neuromuscular re-education;Patient/family education    PT Goals (Current goals can be found in the Care Plan section)  Acute Rehab PT Goals Patient Stated Goal: to get a prosthesis PT Goal Formulation: With patient/family Time For Goal Achievement:  02/09/18 Potential to Achieve Goals: Good    Frequency Min 5X/week   Barriers to discharge        Co-evaluation               AM-PAC PT "6 Clicks" Daily Activity  Outcome Measure Difficulty turning over in bed (including adjusting bedclothes, sheets and blankets)?: None Difficulty moving from lying on back to sitting on the side of the bed? : None Difficulty sitting down on and standing up from a chair with arms (e.g., wheelchair, bedside commode, etc,.)?: Unable Help needed moving to and from a bed to chair (including a wheelchair)?: None Help needed walking in hospital room?: A Little Help needed climbing 3-5 steps with a railing? : A Lot 6 Click Score: 18    End of Session Equipment Utilized During Treatment: Gait belt Activity Tolerance: Patient tolerated treatment well Patient left: in chair;with call bell/phone within reach;with chair alarm set;with family/visitor present Nurse Communication: Mobility status PT Visit Diagnosis: Other abnormalities of gait and mobility (R26.89);Pain Pain - Right/Left: Left Pain - part of body: Leg    Time: 1029-1050 PT Time Calculation (min) (ACUTE ONLY): 21 min   Charges:   PT Evaluation $PT Eval Moderate Complexity: [place], [place], [place] [phone]  [place] 01/26/2018, 11:10 AM

## 2018-01-26 NOTE — Social Work (Signed)
CSW acknowledging consult for SNF, PT recommendations for CIR. If recommendations change please reconsult.  CSW signing off. Please consult if any additional needs arise.  Alexander Mt, [place] Work [phone]

## 2018-01-26 NOTE — Progress Notes (Signed)
Patient ID: Troy [name], male   DOB: [birth year], 57 y.o.   MRN: [id number] Postoperative day 1 left transtibial amputation.  The importance of working on knee extension was discussed.  Patient lacks about 45 degrees to full extension.  Will have [employer] provide a stump shrinker and stump protector.  There is no drainage in the wound VAC canister.  Anticipate discharge to skilled nursing.  Patient complains of pain last night he states he does not have an allergy to dilaudid.  Will change morphine to dilaudid and ensure he is on Neurontin.

## 2018-01-27 ENCOUNTER — Encounter (HOSPITAL_COMMUNITY): Payer: Self-pay | Admitting: Orthopedic Surgery

## 2018-01-27 LAB — GLUCOSE, CAPILLARY
GLUCOSE-CAPILLARY: 131 mg/dL — AB (ref 70–99)
GLUCOSE-CAPILLARY: 133 mg/dL — AB (ref 70–99)
Glucose-Capillary: 134 mg/dL — ABNORMAL HIGH (ref 70–99)
Glucose-Capillary: 115 mg/dL — ABNORMAL HIGH (ref 70–99)

## 2018-01-27 NOTE — Plan of Care (Signed)
  Problem: Clinical Measurements: Goal: Ability to maintain clinical measurements within normal limits will improve Outcome: Progressing   Problem: Coping: Goal: Level of anxiety will decrease Outcome: Progressing   Problem: Pain Managment: Goal: General experience of comfort will improve Outcome: Progressing   

## 2018-01-27 NOTE — Anesthesia Postprocedure Evaluation (Signed)
Anesthesia Post Note  Patient: Troy [name]  Procedure(s) Performed: LEFT BELOW KNEE AMPUTATION (Left )     Patient location during evaluation: PACU Anesthesia Type: MAC and Regional Level of consciousness: awake and alert Pain management: pain level controlled Vital Signs Assessment: post-procedure vital signs reviewed and stable Respiratory status: spontaneous breathing, nonlabored ventilation, respiratory function stable and patient connected to nasal cannula oxygen Cardiovascular status: stable and blood pressure returned to baseline Postop Assessment: no apparent nausea or vomiting Anesthetic complications: no    Last Vitals:  Vitals:   01/26/18 2015 01/27/18 0420  BP: 126/88 (!) 150/94  Pulse: 86 91  Resp: 14 18  Temp: 36.9 C 37.2 C  SpO2: 97% 98%    Last Pain:  Vitals:   01/27/18 0608  TempSrc:   PainSc: 9                  Wille Aubuchon

## 2018-01-27 NOTE — Progress Notes (Signed)
Rehab Admissions Coordinator Note:  Per OT recommendation, Patient was screened by Jhonnie Garner for appropriateness for an Inpatient Acute Rehab Consult.  At this time, it appears pt will progress quickly while in house and will be able to return home with spouse once medically ready. If pt does not progress further while in house, please notify [facility].  Jhonnie Garner 01/27/2018, 8:22 AM  I can be reached at [phone].

## 2018-01-27 NOTE — Plan of Care (Signed)
Problem: Education: Goal: Knowledge of General Education information will improve Description Including pain rating scale, medication(s)/side effects and non-pharmacologic comfort measures Outcome: Progressing   Problem: Health Behavior/Discharge Planning: Goal: Ability to manage health-related needs will improve Outcome: Progressing   Problem: Clinical Measurements: Goal: Ability to maintain clinical measurements within normal limits will improve Outcome: Progressing   Problem: Activity: Goal: Risk for activity intolerance will decrease Outcome: Progressing   Problem: Coping: Goal: Level of anxiety will decrease Outcome: Progressing   Problem: Elimination: Goal: Will not experience complications related to bowel motility Outcome: Progressing Goal: Will not experience complications related to urinary retention Outcome: Progressing   Problem: Skin Integrity: Goal: Risk for impaired skin integrity will decrease Outcome: Progressing

## 2018-01-27 NOTE — Progress Notes (Addendum)
Subjective: 2 Days Post-Op Procedure(s) (LRB): LEFT BELOW KNEE AMPUTATION (Left) Patient reports pain as being under adequate control. [facility] Majestic VAC in place and reminded patient and staff to keep plugged in when not out of bed.  Limited ambulation, activity thus far, but felt to be too high level for CIR. Reconsulted CM re: SNF as wife not confident will be able to manage at home at this time.   Objective: Vital signs in last 24 hours: Temp:  [98.4 F (36.9 C)-99 F (37.2 C)] 99 F (37.2 C) (08/09 0420) Pulse Rate:  [86-96] 88 (08/09 0816) Resp:  [14-18] 18 (08/09 0420) BP: (126-152)/(85-97) 131/85 (08/09 0816) SpO2:  [90 %-98 %] 98 % (08/09 0420)  Intake/Output from previous day: 08/08 0701 - 08/09 0700 In: 1707.3 [P.O.:1560; I.V.:147.3] Out: 750 [Urine:750] Intake/Output this shift: No intake/output data recorded.  Recent Labs    01/25/18 1230  HGB 12.8*   Recent Labs    01/25/18 1230  WBC 9.1  RBC 4.55  HCT 39.3  PLT 169   Recent Labs    01/25/18 1230  NA 136  K 4.0  CL 100  CO2 24  BUN 7  CREATININE 0.57*  GLUCOSE 136*  CALCIUM 8.5*   No results for input(s): LABPT, INR in the last 72 hours.  Patient sitting up in bed with oxygen in place.  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  Scant to no drainage in VAC.     Assessment/Plan: 2 Days Post-Op Procedure(s) (LRB): LEFT BELOW KNEE AMPUTATION (Left) Discharge to SNF when bed available.  Social work reconsulted for SNF placement.  Wean oxygen to room air.     [place], [place] 01/27/2018, 8:52 AM  [phone]

## 2018-01-27 NOTE — Progress Notes (Signed)
Physical Therapy Treatment Patient Details Name: Troy [name] MRN: [id number] DOB: [birth year] Today's Date: 01/27/2018    History of Present Illness Pt is a 57 y/o male s/p L transtibial amputation. 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    PT Comments    Pt making steady progress and tolerated increased ambulation distance this session. PT reviewed HEP therex and again emphasized the importance of L knee extension for future procurement of LE prosthesis. PT will continue to follow acutely to progress mobility as tolerated.   If pt is denied or does not qualify for CIR, PT recommending 24/7 supervision/assistance and HHPT.   Follow Up Recommendations  CIR     Equipment Recommendations  None recommended by PT    Recommendations for Other Services       Precautions / Restrictions Precautions Precautions: Fall Restrictions Weight Bearing Restrictions: Yes LLE Weight Bearing: Non weight bearing    Mobility  Bed Mobility Overal bed mobility: Needs Assistance Bed Mobility: Supine to Sit     Supine to sit: Min guard     General bed mobility comments: pt required increased time and effort, HOB elevated, min guard for safety to achieve sitting EOB towards his L side  Transfers Overall transfer level: Needs assistance Equipment used: Rolling walker (2 wheeled) Transfers: Sit to/from Stand Sit to Stand: Min guard         General transfer comment: pt required increased time and effort, bed in standard position, min guard for safety  Ambulation/Gait Ambulation/Gait assistance: Min guard Gait Distance (Feet): 30 Feet Assistive device: Rolling walker (2 wheeled) Gait Pattern/deviations: (hop-to on R LE) Gait velocity: decreased Gait velocity interpretation: <1.31 ft/sec, indicative of household ambulator General Gait Details: pt steady with RW using a hop-to gait pattern on R LE and min guard for  safety   Stairs             Wheelchair Mobility    Modified Rankin (Stroke Patients Only)       Balance Overall balance assessment: Needs assistance Sitting-balance support: No upper extremity supported Sitting balance-Leahy Scale: Good     Standing balance support: During functional activity;Bilateral upper extremity supported Standing balance-Leahy Scale: Poor Standing balance comment: reliant on UE support for balance                            Cognition Arousal/Alertness: Awake/alert Behavior During Therapy: Impulsive Overall Cognitive Status: Within Functional Limits for tasks assessed                                        Exercises Amputee Exercises Quad Sets: AROM;Strengthening;Left;10 reps;Seated Knee Flexion: AROM;Strengthening;Left;10 reps;Seated Knee Extension: AROM;Strengthening;Left;10 reps;Seated Straight Leg Raises: AROM;Strengthening;Left;15 reps;Seated Other Exercises Other Exercises: discussed importance of knee extension and encouraged pt to rest with L LE with as much knee extension as possible    General Comments        Pertinent Vitals/Pain Pain Assessment: 0-10 Pain Score: 10-Worst pain ever Pain Location: L residual limb Pain Descriptors / Indicators: Sore;Grimacing Pain Intervention(s): Monitored during session;Repositioned    Home Living                      Prior Function  PT Goals (current goals can now be found in the care plan section) Acute Rehab PT Goals PT Goal Formulation: With patient/family Time For Goal Achievement: 02/09/18 Potential to Achieve Goals: Good Progress towards PT goals: Progressing toward goals    Frequency    Min 5X/week      PT Plan Current plan remains appropriate    Co-evaluation              AM-PAC PT "6 Clicks" Daily Activity  Outcome Measure  Difficulty turning over in bed (including adjusting bedclothes, sheets and  blankets)?: None Difficulty moving from lying on back to sitting on the side of the bed? : None Difficulty sitting down on and standing up from a chair with arms (e.g., wheelchair, bedside commode, etc,.)?: Unable Help needed moving to and from a bed to chair (including a wheelchair)?: None Help needed walking in hospital room?: A Little Help needed climbing 3-5 steps with a railing? : A Lot 6 Click Score: 18    End of Session Equipment Utilized During Treatment: Gait belt Activity Tolerance: Patient tolerated treatment well Patient left: in chair;with call bell/phone within reach;with family/visitor present Nurse Communication: Mobility status PT Visit Diagnosis: Other abnormalities of gait and mobility (R26.89);Pain Pain - Right/Left: Left Pain - part of body: Leg     Time: 2878-6767 PT Time Calculation (min) (ACUTE ONLY): 21 min  Charges:  $Gait Training: 8-22 mins                     [place], [place], [place] 01/27/2018, 1:49 PM

## 2018-01-28 LAB — GLUCOSE, CAPILLARY
GLUCOSE-CAPILLARY: 142 mg/dL — AB (ref 70–99)
GLUCOSE-CAPILLARY: 118 mg/dL — AB (ref 70–99)
Glucose-Capillary: 125 mg/dL — ABNORMAL HIGH (ref 70–99)
Glucose-Capillary: 115 mg/dL — ABNORMAL HIGH (ref 70–99)

## 2018-01-28 NOTE — Plan of Care (Signed)
  Problem: Education: Goal: Knowledge of General Education information will improve Description Including pain rating scale, medication(s)/side effects and non-pharmacologic comfort measures Outcome: Progressing Note:  POC and pain management reviewed with pt.   

## 2018-01-29 LAB — GLUCOSE, CAPILLARY
GLUCOSE-CAPILLARY: 135 mg/dL — AB (ref 70–99)
GLUCOSE-CAPILLARY: 174 mg/dL — AB (ref 70–99)
Glucose-Capillary: 143 mg/dL — ABNORMAL HIGH (ref 70–99)
Glucose-Capillary: 112 mg/dL — ABNORMAL HIGH (ref 70–99)

## 2018-01-29 NOTE — Plan of Care (Signed)
  Problem: Activity: Goal: Risk for activity intolerance will decrease Outcome: Progressing   Problem: Coping: Goal: Level of anxiety will decrease Outcome: Progressing   Problem: Pain Managment: Goal: General experience of comfort will improve Outcome: Progressing   Problem: Safety: Goal: Ability to remain free from injury will improve Outcome: Progressing   Problem: Skin Integrity: Goal: Risk for impaired skin integrity will decrease Outcome: Progressing   

## 2018-01-29 NOTE — Plan of Care (Signed)
?  Problem: Elimination: ?Goal: Will not experience complications related to bowel motility ?Outcome: Progressing ?  ?Problem: Pain Managment: ?Goal: General experience of comfort will improve ?Outcome: Progressing ?  ?Problem: Safety: ?Goal: Ability to remain free from injury will improve ?Outcome: Progressing ?  ?

## 2018-01-30 DIAGNOSIS — Z89512 Acquired absence of left leg below knee: Secondary | ICD-10-CM

## 2018-01-30 DIAGNOSIS — I739 Peripheral vascular disease, unspecified: Secondary | ICD-10-CM

## 2018-01-30 LAB — GLUCOSE, CAPILLARY
GLUCOSE-CAPILLARY: 153 mg/dL — AB (ref 70–99)
Glucose-Capillary: 142 mg/dL — ABNORMAL HIGH (ref 70–99)
Glucose-Capillary: 131 mg/dL — ABNORMAL HIGH (ref 70–99)
Glucose-Capillary: 104 mg/dL — ABNORMAL HIGH (ref 70–99)

## 2018-01-30 NOTE — Plan of Care (Signed)
  Problem: Activity: Goal: Risk for activity intolerance will decrease Outcome: Progressing   Problem: Pain Managment: Goal: General experience of comfort will improve Outcome: Progressing   

## 2018-01-30 NOTE — Evaluation (Signed)
Occupational Therapy Evaluation Patient Details Name: Troy [name] MRN: [id number] DOB: April 14, 1961 Today's Date: 01/30/2018    History of Present Illness Pt is a 57 y/o male s/p L transtibial amputation. 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   Clinical Impression   Patient presenting with decrease I in self care, balance, functional transfer/mobility, safety awareness, endurance, and strength. Patient reports being mod I with use of RW PTA. Patient currently functioning set up - min A with use of RW. Patient will benefit from acute OT to increase overall independence in the areas of ADLs, functional mobility,and balance in order to safely discharge home with caregiver.    Follow Up Recommendations  Home health OT;Supervision/Assistance - 24 hour    Equipment Recommendations  None recommended by OT    Recommendations for Other Services Other (comment)(none needed)     Precautions / Restrictions Precautions Precautions: Fall Restrictions Weight Bearing Restrictions: Yes LLE Weight Bearing: Non weight bearing      Mobility Bed Mobility Overal bed mobility: Needs Assistance Bed Mobility: Supine to Sit     Supine to sit: Supervision     General bed mobility comments: increased time and effort but no physical assistance needed  Transfers Overall transfer level: Needs assistance Equipment used: Rolling walker (2 wheeled) Transfers: Sit to/from Stand Sit to Stand: Min guard         Balance Overall balance assessment: Needs assistance Sitting-balance support: No upper extremity supported Sitting balance-Leahy Scale: Good     Standing balance support: During functional activity;Bilateral upper extremity supported Standing balance-Leahy Scale: Fair Standing balance comment: reliant on UE support for balance      ADL either performed or assessed with clinical judgement   ADL Overall ADL's : Needs  assistance/impaired     Grooming: Sitting;Wash/dry hands;Wash/dry face;Oral care;Applying deodorant;Brushing hair;Set up;Supervision/safety   Upper Body Bathing: Set up;Supervision/ safety;Sitting   Lower Body Bathing: Minimal assistance;Sitting/lateral leans   Upper Body Dressing : Set up;Sitting   Lower Body Dressing: Minimal assistance;Sitting/lateral leans   Toilet Transfer: Min guard;RW;Comfort height toilet   Toileting- Clothing Manipulation and Hygiene: Min guard;Sit to/from stand    General ADL Comments: min guard for balance with mobility,functional transfers, and standing balance     Vision Baseline Vision/History: Wears glasses Wears Glasses: At all times Patient Visual Report: No change from baseline              Pertinent Vitals/Pain Pain Assessment: 0-10 Pain Score: 5  Pain Location: L residual limb Pain Descriptors / Indicators: Discomfort Pain Intervention(s): Limited activity within patient's tolerance;Monitored during session;Repositioned     Hand Dominance Right   Extremity/Trunk Assessment Upper Extremity Assessment Upper Extremity Assessment: Overall WFL for tasks assessed   Lower Extremity Assessment Lower Extremity Assessment: Defer to PT evaluation   Cervical / Trunk Assessment Cervical / Trunk Assessment: Normal   Communication Communication Communication: No difficulties   Cognition Arousal/Alertness: Awake/alert Behavior During Therapy: WFL for tasks assessed/performed Overall Cognitive Status: Within Functional Limits for tasks assessed                    Home Living Family/patient expects to be discharged to:: Private residence Living Arrangements: Spouse/significant other Available Help at Discharge: Family;Available 24 hours/day Type of Home: House Home Access: Ramped entrance     Home Layout: One level     Bathroom Shower/Tub: Occupational psychologist: Standard  Home Equipment: [place] - 2  wheels;Wheelchair - manual;Shower seat;Bedside commode          Prior Functioning/Environment Level of Independence: Independent with assistive device(s)        Comments: pt was previously ambulating with use of RW and independent with ADLs        OT Problem List: Decreased strength;Impaired balance (sitting and/or standing);Pain;Decreased range of motion;Decreased safety awareness;Decreased activity tolerance;Decreased knowledge of use of DME or AE      OT Treatment/Interventions: Self-care/ADL training;DME and/or AE instruction;Therapeutic activities;Balance training;Therapeutic exercise;Manual therapy;Energy conservation;Patient/family education    OT Goals(Current goals can be found in the care plan section) Acute Rehab OT Goals Patient Stated Goal: to get a prosthesis OT Goal Formulation: With patient/family Time For Goal Achievement: 02/13/18 Potential to Achieve Goals: Good ADL Goals Pt Will Perform Lower Body Bathing: with modified independence Pt Will Perform Lower Body Dressing: with modified independence Pt Will Transfer to Toilet: with modified independence;ambulating Pt Will Perform Toileting - Clothing Manipulation and hygiene: with modified independence Pt Will Perform Tub/Shower Transfer: Shower transfer;with supervision;ambulating;shower seat;rolling walker  OT Frequency: Min 3X/week   Barriers to D/C: Other (comment)  none known at this time          AM-PAC PT "6 Clicks" Daily Activity     Outcome Measure Help from another person eating meals?: None Help from another person taking care of personal grooming?: None Help from another person toileting, which includes using toliet, bedpan, or urinal?: A Little Help from another person bathing (including washing, rinsing, drying)?: A Little Help from another person to put on and taking off regular upper body clothing?: None Help from another person to put on and taking off regular lower body clothing?: A  Little 6 Click Score: 21   End of Session Equipment Utilized During Treatment: Rolling walker  Activity Tolerance: Patient tolerated treatment well Patient left: in chair;with call bell/phone within reach;with family/visitor present  OT Visit Diagnosis: Muscle weakness (generalized) (M62.81);Pain Pain - Right/Left: Left Pain - part of body: Leg                Time: 1510-1525 OT Time Calculation (min): 15 min Charges:  OT General Charges $OT Visit: 1 Visit OT Evaluation $OT Eval Low Complexity: 1 Low  Juandaniel Manfredo P, MS, OTR/L 01/30/2018, 3:33 PM

## 2018-01-30 NOTE — Progress Notes (Signed)
Inpatient Rehabilitation  Please see consult by Dr. Naaman Plummer for full details.  Patient doing well and has support of spouse.  Recommends home with home health.  Will sing off at this time.    Carmelia Roller., CCC/SLP Admission Coordinator  [place]  Cell [phone]

## 2018-01-30 NOTE — Care Management Note (Addendum)
Case Management Note  Patient Details  Name: Troy [name] MRN: [id number] Date of Birth: 28-Aug-1960  Subjective/Objective:   S/p l transtibial amp                 Action/Plan: NCM spoke to pt and states he had [facility] in the past. Will need HH PT and OT orders with [facility]. Will have NCM notify [facility] on 8/13 of [place] needs. He has RW, bedside commode, wheelchair, shower chair and crutches at home. Wife at home to assist with care.    Expected Discharge Date:                  Expected Discharge Plan:  [place]  In-House Referral:  Clinical Social Work  Discharge planning Services  CM Consult  Post Acute Care Choice:  Home Health Choice offered to:  Patient  DME Arranged:  N/A DME Agency:  NA  HH Arranged:  PT, OT HH Agency:  [facility]  Status of Service:  In process, will continue to follow  If discussed at Long Length of Stay Meetings, dates discussed:    Additional Comments:  Erenest Rasher, RN 01/30/2018, 5:54 PM

## 2018-01-30 NOTE — Progress Notes (Signed)
Physical Therapy Treatment Patient Details Name: Troy [name] MRN: [id number] DOB: March 29, 1961 Today's Date: 01/30/2018    History of Present Illness Pt is a 57 y/o male s/p L transtibial amputation. 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    PT Comments    Pt performed gait training and supine/sidelying exercises during session this pm.  Pt is progressing well and required decreased assistance for all functional mobility.  Plan for curb negotiation in am as he reports having a curb step to negotiate at home.  Will inform supervising PT of need for change in recommendations at this time.  Pt will be returning home to supportive spouse.     Follow Up Recommendations  Home health PT;Supervision - Intermittent     Equipment Recommendations  None recommended by PT    Recommendations for Other Services Rehab consult     Precautions / Restrictions Precautions Precautions: Fall Restrictions Weight Bearing Restrictions: Yes LLE Weight Bearing: Non weight bearing    Mobility  Bed Mobility Overal bed mobility: Needs Assistance;Modified Independent Bed Mobility: Sit to Supine     Supine to sit: Supervision Sit to supine: Modified independent (Device/Increase time)   General bed mobility comments: increased time and effort but no physical assistance needed  Transfers Overall transfer level: Modified independent Equipment used: Rolling walker (2 wheeled) Transfers: Sit to/from Stand Sit to Stand: Modified independent (Device/Increase time)            Ambulation/Gait Ambulation/Gait assistance: Supervision Gait Distance (Feet): 100 Feet Assistive device: Rolling walker (2 wheeled) Gait Pattern/deviations: Step-to pattern;Trunk flexed(hop to pattern)     General Gait Details: Cues for upper trunk control and pacing.  Pt slow and guarded due to fatigue. Required x3 standing rest breaks.     Stairs              Wheelchair Mobility    Modified Rankin (Stroke Patients Only)       Balance Overall balance assessment: Needs assistance Sitting-balance support: No upper extremity supported Sitting balance-Leahy Scale: Good     Standing balance support: During functional activity;Bilateral upper extremity supported Standing balance-Leahy Scale: Fair Standing balance comment: reliant on UE support for balance                            Cognition Arousal/Alertness: Awake/alert Behavior During Therapy: WFL for tasks assessed/performed Overall Cognitive Status: Within Functional Limits for tasks assessed                                        Exercises Amputee Exercises Quad Sets: AROM;Left;10 reps;Supine Hip Extension: AROM;Left;10 reps;Sidelying Hip ABduction/ADduction: AROM;Left;10 reps;Supine;Sidelying(1x10 in supine and 1x10 in sidelying) Straight Leg Raises: AROM;Strengthening;Left;15 reps;Seated    General Comments        Pertinent Vitals/Pain Pain Assessment: 0-10 Pain Score: 5  Pain Location: L residual limb Pain Descriptors / Indicators: Discomfort Pain Intervention(s): Monitored during session;Repositioned    Home Living Family/patient expects to be discharged to:: Private residence Living Arrangements: Spouse/significant other Available Help at Discharge: Family;Available 24 hours/day Type of Home: House Home Access: Ramped entrance   Home Layout: One level Home Equipment: [place] - 2 wheels;Wheelchair - manual;Shower seat;Bedside commode      Prior Function Level of Independence: Independent with assistive device(s)  Comments: pt was previously ambulating with use of RW and independent with ADLs   PT Goals (current goals can now be found in the care plan section) Acute Rehab PT Goals Patient Stated Goal: to get a prosthesis Potential to Achieve Goals: Good Progress towards PT goals: Progressing toward goals     Frequency    Min 5X/week      PT Plan Current plan remains appropriate    Co-evaluation              AM-PAC PT "6 Clicks" Daily Activity  Outcome Measure  Difficulty turning over in bed (including adjusting bedclothes, sheets and blankets)?: None Difficulty moving from lying on back to sitting on the side of the bed? : None Difficulty sitting down on and standing up from a chair with arms (e.g., wheelchair, bedside commode, etc,.)?: None Help needed moving to and from a bed to chair (including a wheelchair)?: None Help needed walking in hospital room?: A Little Help needed climbing 3-5 steps with a railing? : A Little 6 Click Score: 22    End of Session Equipment Utilized During Treatment: Gait belt Activity Tolerance: Patient tolerated treatment well Patient left: in chair;with call bell/phone within reach;with family/visitor present Nurse Communication: Mobility status PT Visit Diagnosis: Other abnormalities of gait and mobility (R26.89);Pain Pain - Right/Left: Left Pain - part of body: Leg     Time: 1738-1759 PT Time Calculation (min) (ACUTE ONLY): 21 min  Charges:  $Gait Training: 8-22 mins                     Governor Rooks, PTA pager [phone]    Cristela Blue 01/30/2018, 6:16 PM

## 2018-01-30 NOTE — Consult Note (Signed)
Physical Medicine and Rehabilitation Consult Reason for Consult: Decreased functional mobility Referring Physician: Dr. Sharol Given   HPI: Troy [name] is a 57 y.o. 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  Presented 01/25/2018 with gangrenous left midfoot.  No change with conservative care.  Per chart review patient lives with spouse in [place].  Independent with assistive device prior to admission.  One level home with ramped entrance.  Wife can assist as needed.  Underwent left transtibial amputation 01/25/2018 per Dr. Sharol Given and wound VAC applied.  Hospital course pain management.  Therapy evaluations completed.  MD has requested physical medicine rehab consult.   Review of Systems  Cardiovascular:       Positive leg swelling.  Denies any chest pain or shortness of breath  Gastrointestinal:       Bouts of constipation.  No nausea vomiting  Musculoskeletal:       Positive myalgias and back pain  Psychiatric/Behavioral:       Positive anxiety and depression  All other systems reviewed and are negative.  Past Medical History:  Diagnosis Date  . Acute renal failure ([place])    in setting of NSAID use and orthopedic surgery 2010  . Anxiety and depression   . Chronic diastolic CHF (congestive heart failure) ([place])    a. Echo 6/17: severe conc LVH, vigorous EF, EF 65-70%, no dynamic obstruction, no RWMA, Gr 1 DD, mild TR  //  b. LHC 8/17: no sig CAD, LVEDP 28  . COPD (chronic obstructive pulmonary disease) ([place])   . Diabetic ulcer of left foot ([place])   . DM2 (diabetes mellitus, type 2) ([place])   . Family history of early CAD   . Fatty liver   . GERD (gastroesophageal reflux disease)   . History of amputation of foot ([place])    L trans-met // R toe  . History of cardiac catheterization    a. [place] 2002: irregs  //  b. LHC in 8/17: no  sig CAD, apical DK, hyperdynamic LV, LVEDP 28  . History of kidney stones   . History of nuclear stress test    a. Nuc 7/17: Overall, intermediate risk nuclear stress test secondary to small size of apical lateral defect and reduced ejection fraction.  EF 43%  . HLD (hyperlipidemia)   . HTN (hypertension)   . Injuries     crushing injury to both his feet in February 2010.   Marland Kitchen Kidney calculi   . Palpitations   . Tobacco abuse    Past Surgical History:  Procedure Laterality Date  . AMPUTATION Left 01/03/2018   Procedure: LEFT MIDFOOT AMPUTATION/REVISION MIDAMPUTAION;  Surgeon: Mcarthur Rossetti, MD;  Location: [facility];  Service: Orthopedics;  Laterality: Left;  . AMPUTATION Left 01/25/2018   Procedure: LEFT BELOW KNEE AMPUTATION;  Surgeon: [name], MD;  Location: [facility];  Service: Orthopedics;  Laterality: Left;  . BELOW KNEE LEG AMPUTATION Left 01/25/2018  . CARDIAC CATHETERIZATION N/A 01/22/2016   Procedure: Left Heart Cath and Coronary Angiography;  Surgeon: Peter M Martinique, MD;  Location: [facility];  Service: Cardiovascular;  Laterality: N/A;  . FOOT AMPUTATION Bilateral   . I&D EXTREMITY Left 12/15/2017   Procedure: IRRIGATION AND DEBRIDEMENT LEFT FOOT ULCER;  Surgeon: Mcarthur Rossetti, MD;  Location: [facility];  Service: Orthopedics;  Laterality: Left;  . LITHOTRIPSY    .  TENDON LENGTHENING Bilateral    calf  . TONSILLECTOMY     Family History  Problem Relation Age of Onset  . Leukemia Mother 47       died  . Lung cancer Father 73       died  . Heart attack Brother 55  . Heart attack Brother 3  . Hypertension Brother        X3  . Hypertension Sister        X22  . Diabetes Sister   . Stroke Sister   . Diabetes Sister   . Other Brother        Musician accident   Social History:  reports that he quit smoking 7 days ago. His smoking use included cigarettes. He has a 20.00 pack-year smoking history. He has never used smokeless tobacco. He reports that he does  not drink alcohol or use drugs. Allergies:  Allergies  Allergen Reactions  . Hydromorphone Hcl Er Anaphylaxis and Other (See Comments)    Allergic to DYE in extended-release tablet, can tolerate other forms of hydromorphone  . Tapentadol Swelling and Other (See Comments)    THROAT ANGIOEDEMA Nucynta [Tapentadol Hydrochloride]  . Exalamide Other (See Comments)    UNSPECIFIED REACTION    Medications Prior to Admission  Medication Sig Dispense Refill  . amitriptyline (ELAVIL) 150 MG tablet Take 150 mg by mouth at bedtime.      Marland Kitchen aspirin EC 81 MG tablet Take 1 tablet (81 mg total) by mouth daily. 90 tablet 3  . dapagliflozin propanediol (FARXIGA) 10 MG TABS tablet Take 10 mg by mouth daily.    Marland Kitchen doxycycline (VIBRA-TABS) 100 MG tablet Take 1 tablet (100 mg total) by mouth 2 (two) times daily. 30 tablet 0  . Dulaglutide 1.5 MG/0.5ML SOPN Inject 1.5 mg into the skin every Wednesday.     . furosemide (LASIX) 80 MG tablet Take 0.5 tablets (40 mg total) by mouth daily. (Patient taking differently: Take 20 mg by mouth daily. ) 15 tablet 6  . glipiZIDE (GLUCOTROL XL) 10 MG 24 hr tablet Take 10 mg by mouth daily with breakfast.    . HYDROmorphone (DILAUDID) 4 MG tablet Take 4 mg by mouth 5 (five) times daily.    Marland Kitchen LORazepam (ATIVAN) 1 MG tablet Take 1 mg by mouth at bedtime.     Marland Kitchen losartan (COZAAR) 50 MG tablet TAKE 1 TABLET (50 MG TOTAL) BY MOUTH DAILY. 90 tablet 3  . metFORMIN (GLUCOPHAGE-XR) 500 MG 24 hr tablet Take 1,000 mg by mouth 2 (two) times daily.     . metoprolol succinate (TOPROL-XL) 100 MG 24 hr tablet Take 100 mg every AM and 50 mg every PM. Take with or immediately following a meal. (Patient taking differently: Take 50-100 mg by mouth See admin instructions. Take 100 mg every AM and 50 mg every PM. Take with or immediately following a meal.) 135 tablet 3  . mupirocin ointment (BACTROBAN) 2 % Apply 1 application topically every other day.  1  . omeprazole (PRILOSEC) 40 MG capsule Take 40  mg by mouth daily.    . potassium chloride (KLOR-CON) 8 MEQ tablet Take 8 mEq by mouth daily.    . simvastatin (ZOCOR) 40 MG tablet Take 40 mg by mouth every evening.     Marland Kitchen tiZANidine (ZANAFLEX) 4 MG capsule Take 12 mg by mouth at bedtime.     . traZODone (DESYREL) 50 MG tablet Take 50 mg by mouth at bedtime.     Marland Kitchen  albuterol (PROVENTIL HFA;VENTOLIN HFA) 108 (90 Base) MCG/ACT inhaler Inhale 2 puffs into the lungs every 6 (six) hours as needed for wheezing or shortness of breath. (Patient not taking: Reported on 12/12/2017) 1 Inhaler 6  . ipratropium (ATROVENT) 0.02 % nebulizer solution Take 2.5 mLs (0.5 mg total) by nebulization 4 (four) times daily. (Patient taking differently: Take 0.5 mg by nebulization 4 (four) times daily as needed for wheezing or shortness of breath. ) 75 mL 12    Home: Home Living Family/patient expects to be discharged to:: Private residence Living Arrangements: Spouse/significant other Available Help at Discharge: Family, Available 24 hours/day Type of Home: House Home Access: Ramped entrance Home Layout: One level Home Equipment: Environmental consultant - 2 wheels, Wheelchair - manual  Functional History: Prior Function Level of Independence: Independent with assistive device(s) Comments: pt was previously ambulating with use of RW and independent with ADLs Functional Status:  Mobility: Bed Mobility Overal bed mobility: Needs Assistance Bed Mobility: Supine to Sit Supine to sit: Min guard General bed mobility comments: pt required increased time and effort, HOB elevated, min guard for safety to achieve sitting EOB towards his L side Transfers Overall transfer level: Needs assistance Equipment used: Rolling walker (2 wheeled) Transfers: Sit to/from Stand Sit to Stand: Min guard General transfer comment: pt required increased time and effort, bed in standard position, min guard for safety Ambulation/Gait Ambulation/Gait assistance: Min guard Gait Distance (Feet): 30  Feet Assistive device: Rolling walker (2 wheeled) Gait Pattern/deviations: (hop-to on R LE) General Gait Details: pt steady with RW using a hop-to gait pattern on R LE and min guard for safety Gait velocity: decreased Gait velocity interpretation: <1.31 ft/sec, indicative of household ambulator    ADL:    Cognition: Cognition Overall Cognitive Status: Within Functional Limits for tasks assessed Orientation Level: Oriented X4 Cognition Arousal/Alertness: Awake/alert Behavior During Therapy: Impulsive Overall Cognitive Status: Within Functional Limits for tasks assessed  Blood pressure 104/77, pulse 86, temperature (!) 97.5 F (36.4 C), temperature source Oral, resp. rate 16, height 5' 11"  (1.803 m), weight 102.1 kg, SpO2 95 %. Physical Exam  Constitutional: No distress.  57 year old right-handed male  HENT:  Head: Normocephalic.  Eyes: Pupils are equal, round, and reactive to light.  Neck: Normal range of motion.  Cardiovascular: Normal rate.  Cardiac rate controlled  Respiratory: Effort normal.  Lungs clear to auscultation without wheeze  GI: Soft.  Abdomen soft nontender good bowel sounds  Musculoskeletal: Normal range of motion.  Neurological: He is alert. No cranial nerve deficit.  Patient is alert and oriented.  Follows full commands. UE 5/5. Able to lift left leg off the bed. RLE 4/5 prox to distal. No sensory abnl  Skin:  BKA site is dressed appropriately tender with wound VAC in place  Psychiatric: He has a normal mood and affect.    Results for orders placed or performed during the hospital encounter of 01/25/18 (from the past 24 hour(s))  Glucose, capillary     Status: Abnormal   Collection Time: 01/29/18 12:02 PM  Result Value Ref Range   Glucose-Capillary 112 (H) 70 - 99 mg/dL  Glucose, capillary     Status: Abnormal   Collection Time: 01/29/18  4:28 PM  Result Value Ref Range   Glucose-Capillary 135 (H) 70 - 99 mg/dL  Glucose, capillary     Status:  Abnormal   Collection Time: 01/29/18  9:07 PM  Result Value Ref Range   Glucose-Capillary 174 (H) 70 - 99 mg/dL  Glucose, capillary  Status: Abnormal   Collection Time: 01/30/18  6:33 AM  Result Value Ref Range   Glucose-Capillary 142 (H) 70 - 99 mg/dL   No results found.   Assessment/Plan: Diagnosis: left BKA 1. Does the need for close, 24 hr/day medical supervision in concert with the patient's rehab needs make it unreasonable for this patient to be served in a less intensive setting? No 2. Co-Morbidities requiring supervision/potential complications:   3. Due to bladder management, bowel management, skin/wound care and medication administration, does the patient require 24 hr/day rehab nursing? No 4. Does the patient require coordinated care of a physician, rehab nurse, PT (1-2 hrs/day, 5 days/week) and OT (1-2 hrs/day, 5 days/week) to address physical and functional deficits in the context of the above medical diagnosis(es)? No Addressing deficits in the following areas: balance, endurance, locomotion, transferring, bowel/bladder control, bathing, dressing and grooming 5. Can the patient actively participate in an intensive therapy program of at least 3 hrs of therapy per day at least 5 days per week? Yes 6. The potential for patient to make measurable gains while on inpatient rehab is fair 7. Anticipated functional outcomes upon discharge from inpatient rehab are n/a  with PT, n/a with OT, n/a with SLP. 8. Estimated rehab length of stay to reach the above functional goals is:   9. Anticipated D/C setting: Home 10. Anticipated post D/C treatments: [place] therapy 11. Overall Rehab/Functional Prognosis: excellent  RECOMMENDATIONS: This patient's condition is appropriate for continued rehabilitative care in the following setting: [facility] Therapy Patient has agreed to participate in recommended program. Yes Note that insurance prior authorization may be required for reimbursement for  recommended care.  Comment: Spoke to patient and wife at length. Only one small step to enter home. House accessible as well once inside. He has been getting up with wife to use bathroom on his own already. Ambulated 12' with therapy most recently at min-guard assist level.   Meredith Staggers, MD, [facility] 01/30/2018    Lavon Paganini Harrell, PA-C 01/30/2018

## 2018-01-30 NOTE — Progress Notes (Signed)
Patient ID: Troy [name], male   DOB: Dec 21, 1960, 57 y.o.   MRN: [id number] Consulted CIR vs SNF, patient is not sure he will be independent after a week of CIR, most likely will need SNF, VAC no drainage, knee full extension

## 2018-01-31 LAB — GLUCOSE, CAPILLARY
GLUCOSE-CAPILLARY: 130 mg/dL — AB (ref 70–99)
Glucose-Capillary: 125 mg/dL — ABNORMAL HIGH (ref 70–99)
Glucose-Capillary: 131 mg/dL — ABNORMAL HIGH (ref 70–99)

## 2018-01-31 MED ORDER — OXYCODONE-ACETAMINOPHEN 5-325 MG PO TABS: 1.0000 | ORAL_TABLET | ORAL | 0 refills | Status: DC | PRN

## 2018-01-31 NOTE — Progress Notes (Signed)
Discharge instructions completed with pt.  Pt verbalized understanding of the information.  Pt denies chest pain, shortness of breath, dizziness, lightheadedness, and n/v.  Pt's IV d/c'ed.  Pt waiting on ride to go home.

## 2018-01-31 NOTE — Discharge Summary (Signed)
Discharge Diagnoses:  Active Problems:   Wound dehiscence, surgical   Below knee amputation status, left ([place])   Surgeries: Procedure(s): LEFT BELOW KNEE AMPUTATION on 01/25/2018    Consultants:   Discharged Condition: Improved  Hospital Course: Troy [name] is an 57 y.o. male who was admitted 01/25/2018 with a chief complaint of dehiscence left TMA, with a final diagnosis of Dehiscence Left Midfoot Amputation.  Patient was brought to the operating room on 01/25/2018 and underwent Procedure(s): LEFT BELOW KNEE AMPUTATION.    Patient was given perioperative antibiotics:  Anti-infectives (From admission, onward)   Start     Dose/Rate Route Frequency Ordered Stop   01/25/18 2200  ceFAZolin (ANCEF) IVPB 1 g/50 mL premix     1 g 100 mL/hr over 30 Minutes Intravenous Every 6 hours 01/25/18 1532 01/26/18 1046   01/25/18 1115  ceFAZolin (ANCEF) IVPB 2g/100 mL premix     2 g 200 mL/hr over 30 Minutes Intravenous On call to O.R. 01/25/18 1107 01/25/18 1339    .  Patient was given sequential compression devices, early ambulation, and aspirin for DVT prophylaxis.  Recent vital signs:  Patient Vitals for the past 24 hrs:  BP Temp Temp src Pulse Resp SpO2  01/31/18 0438 112/68 99 F (37.2 C) Oral 99 16 96 %  01/30/18 2011 110/60 98 F (36.7 C) Oral 92 16 97 %  01/30/18 1510 109/78 98.4 F (36.9 C) Oral 80 17 97 %  .  Recent laboratory studies: No results found.  Discharge Medications:   Allergies as of 01/31/2018      Reactions   Hydromorphone Hcl Er Anaphylaxis, Other (See Comments)   Allergic to DYE in extended-release tablet, can tolerate other forms of hydromorphone   Tapentadol Swelling, Other (See Comments)   THROAT ANGIOEDEMA Nucynta [Tapentadol Hydrochloride]   Exalamide Other (See Comments)   UNSPECIFIED REACTION       Medication List    STOP taking these medications   doxycycline 100 MG tablet Commonly known as:  VIBRA-TABS   HYDROmorphone 4 MG tablet Commonly  known as:  DILAUDID   mupirocin ointment 2 % Commonly known as:  BACTROBAN     TAKE these medications   albuterol 108 (90 Base) MCG/ACT inhaler Commonly known as:  PROVENTIL HFA;VENTOLIN HFA Inhale 2 puffs into the lungs every 6 (six) hours as needed for wheezing or shortness of breath.   amitriptyline 150 MG tablet Commonly known as:  ELAVIL Take 150 mg by mouth at bedtime.   aspirin EC 81 MG tablet Take 1 tablet (81 mg total) by mouth daily.   Dulaglutide 1.5 MG/0.5ML Sopn Inject 1.5 mg into the skin every Wednesday.   FARXIGA 10 MG Tabs tablet Generic drug:  dapagliflozin propanediol Take 10 mg by mouth daily.   furosemide 80 MG tablet Commonly known as:  LASIX Take 0.5 tablets (40 mg total) by mouth daily. What changed:  how much to take   glipiZIDE 10 MG 24 hr tablet Commonly known as:  GLUCOTROL XL Take 10 mg by mouth daily with breakfast.   ipratropium 0.02 % nebulizer solution Commonly known as:  ATROVENT Take 2.5 mLs (0.5 mg total) by nebulization 4 (four) times daily. What changed:    when to take this  reasons to take this   LORazepam 1 MG tablet Commonly known as:  ATIVAN Take 1 mg by mouth at bedtime.   losartan 50 MG tablet Commonly known as:  COZAAR TAKE 1 TABLET (50 MG TOTAL) BY MOUTH  DAILY.   metFORMIN 500 MG 24 hr tablet Commonly known as:  GLUCOPHAGE-XR Take 1,000 mg by mouth 2 (two) times daily.   metoprolol succinate 100 MG 24 hr tablet Commonly known as:  TOPROL-XL Take 100 mg every AM and 50 mg every PM. Take with or immediately following a meal. What changed:    how much to take  how to take this  when to take this   omeprazole 40 MG capsule Commonly known as:  PRILOSEC Take 40 mg by mouth daily.   oxyCODONE-acetaminophen 5-325 MG tablet Commonly known as:  PERCOCET/ROXICET Take 1 tablet by mouth every 4 (four) hours as needed for severe pain.   potassium chloride 8 MEQ tablet Commonly known as:  KLOR-CON Take 8 mEq  by mouth daily.   simvastatin 40 MG tablet Commonly known as:  ZOCOR Take 40 mg by mouth every evening.   tiZANidine 4 MG capsule Commonly known as:  ZANAFLEX Take 12 mg by mouth at bedtime.   traZODone 50 MG tablet Commonly known as:  DESYREL Take 50 mg by mouth at bedtime.       Diagnostic Studies: No results found.  Patient benefited maximally from their hospital stay and there were no complications.     Disposition: Discharge disposition: 01-Home or Self Care      Discharge Instructions    Call MD / Call 911   Complete by:  As directed    If you experience chest pain or shortness of breath, CALL 911 and be transported to the hospital emergency room.  If you develope a fever above 101 F, pus (white drainage) or increased drainage or redness at the wound, or calf pain, call your surgeon's office.   Constipation Prevention   Complete by:  As directed    Drink plenty of fluids.  Prune juice may be helpful.  You may use a stool softener, such as Colace (over the counter) 100 mg twice a day.  Use MiraLax (over the counter) for constipation as needed.   Diet - low sodium heart healthy   Complete by:  As directed    Increase activity slowly as tolerated   Complete by:  As directed    Negative Pressure Wound Therapy - Incisional   Complete by:  As directed    Negative Pressure Wound Therapy - Incisional   Complete by:  As directed    Negative Pressure Wound Therapy - Incisional   Complete by:  As directed      Follow-up Information    [name], MD In 1 week.   Specialty:  Orthopedic Surgery Contact information: [place] [address] [phone]        [facility]-Home Follow up.   Specialty:  Home Health Services Why:  Home Health Physical Therapy, Occupational Therapy-agency will call to arrange initial visit Contact information: [address] [place]  [address] [phone]             Signed: [name] 01/31/2018, 6:37 AM

## 2018-01-31 NOTE — Progress Notes (Signed)
Physical Therapy Treatment Patient Details Name: Troy [name] MRN: [id number] DOB: Nov 15, 1960 Today's Date: 01/31/2018    History of Present Illness Pt is a 57 y/o male s/p L transtibial amputation. 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    PT Comments    Pt visited this am to review curb negotiation for safe entry into his home.  Pt is slow and guarded and fatigues quickly.  He has access to RW or WC at home.  He will require HHPT at d/c.  Pt mildly insteady during stair training but no true LOB.  Post mobility patient able to re-donn limb guard with supervision.  Pt is eager to d/c home.      Follow Up Recommendations  Home health PT;Supervision - Intermittent     Equipment Recommendations  None recommended by PT    Recommendations for Other Services Rehab consult     Precautions / Restrictions Precautions Precautions: Fall Restrictions Weight Bearing Restrictions: Yes LLE Weight Bearing: Non weight bearing    Mobility  Bed Mobility Overal bed mobility: Modified Independent Bed Mobility: Supine to Sit       Sit to supine: Modified independent (Device/Increase time)      Transfers Overall transfer level: Modified independent   Transfers: Sit to/from Stand Sit to Stand: Modified independent (Device/Increase time)            Ambulation/Gait Ambulation/Gait assistance: Supervision Gait Distance (Feet): 20 Feet Assistive device: Rolling walker (2 wheeled) Gait Pattern/deviations: Step-to pattern;Trunk flexed     General Gait Details: Cues for upper trunk control and safety with device.  Performed short trial to and from curb.     Stairs Stairs: Yes Stairs assistance: Min guard Stair Management: No rails;Backwards;Forwards;With walker Number of Stairs: 2(x1 backwards negotiation and x1 forwards negotiation.  Pt prefers forward technique.  ) General stair comments: Cues for sequencing and  RW placement to complete.  Pt slow and guarded.  MIld unsteadiness when moving walker from lower<>higher level.     Wheelchair Mobility    Modified Rankin (Stroke Patients Only)       Balance     Sitting balance-Leahy Scale: Good       Standing balance-Leahy Scale: Poor                              Cognition Arousal/Alertness: Awake/alert Behavior During Therapy: WFL for tasks assessed/performed Overall Cognitive Status: Within Functional Limits for tasks assessed                                        Exercises Amputee Exercises Hip Extension: AROM;Left;10 reps;Standing(x3 reps then patient fatigued and requested to sit down.  )    General Comments        Pertinent Vitals/Pain Pain Assessment: 0-10 Pain Score: 4  Pain Location: L residual limb Pain Descriptors / Indicators: Discomfort Pain Intervention(s): Monitored during session;Repositioned    Home Living                      Prior Function            PT Goals (current goals can now be found in the care plan section) Acute Rehab PT Goals Patient Stated Goal: to get a prosthesis Potential to Achieve Goals:  Good Progress towards PT goals: Progressing toward goals    Frequency    Min 5X/week      PT Plan Current plan remains appropriate    Co-evaluation              AM-PAC PT "6 Clicks" Daily Activity  Outcome Measure  Difficulty turning over in bed (including adjusting bedclothes, sheets and blankets)?: None Difficulty moving from lying on back to sitting on the side of the bed? : None Difficulty sitting down on and standing up from a chair with arms (e.g., wheelchair, bedside commode, etc,.)?: None Help needed moving to and from a bed to chair (including a wheelchair)?: None Help needed walking in hospital room?: A Little Help needed climbing 3-5 steps with a railing? : A Little 6 Click Score: 22    End of Session Equipment Utilized During  Treatment: Gait belt Activity Tolerance: Patient tolerated treatment well Patient left: in chair;with call bell/phone within reach;with family/visitor present Nurse Communication: Mobility status PT Visit Diagnosis: Other abnormalities of gait and mobility (R26.89);Pain Pain - Right/Left: Left Pain - part of body: Leg     Time: 6153-7943 PT Time Calculation (min) (ACUTE ONLY): 10 min  Charges:  $Gait Training: 8-22 mins                     Troy [name], PTA pager [phone]    Troy [name] 01/31/2018, 10:53 AM

## 2018-01-31 NOTE — Progress Notes (Signed)
Occupational Therapy Treatment Patient Details Name: Troy [name] MRN: [id number] DOB: Feb 13, 1961 Today's Date: 01/31/2018    History of present illness Pt is a 57 y/o male s/p L transtibial amputation. 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   OT comments  Pt demonstrating progress toward OT goals. He was able to complete ambulation to bathroom and stand for grooming tasks with overall supervision this session. Pt continues to require min assist for LB dressing tasks to don limb guard safely at this time. Pt currently with impulsivity as well as decreased safety awareness impacting his safety during ADL participation. He and his wife verbalize understanding of precautions and compensatory strategies. Educated pt concerning potential use of reacher to pick up items dropped to the floor as well as safe management of doors during home navigation. Pt would benefit from continued OT services while admitted to maximize independence and safety with ADL and functional mobility prior to returning home. Note plan to D/C home this date.   Follow Up Recommendations  Home health OT;Supervision/Assistance - 24 hour    Equipment Recommendations  None recommended by OT    Recommendations for Other Services Other (comment)(none needed)    Precautions / Restrictions Precautions Precautions: Fall Restrictions Weight Bearing Restrictions: Yes LLE Weight Bearing: Non weight bearing       Mobility Bed Mobility Overal bed mobility: Modified Independent Bed Mobility: Supine to Sit       Sit to supine: Modified independent (Device/Increase time)   General bed mobility comments: OOB in recliner on my arrival.   Transfers Overall transfer level: Needs assistance Equipment used: Rolling walker (2 wheeled) Transfers: Sit to/from Stand Sit to Stand: Supervision         General transfer comment: Supervision for safety with cues to  limit impulsivity.     Balance Overall balance assessment: Needs assistance Sitting-balance support: No upper extremity supported Sitting balance-Leahy Scale: Good     Standing balance support: During functional activity;Bilateral upper extremity supported;Single extremity supported Standing balance-Leahy Scale: Poor Standing balance comment: Relies on at least single UE support while standing at sink for grooming.                            ADL either performed or assessed with clinical judgement   ADL Overall ADL's : Needs assistance/impaired Eating/Feeding: Set up;Sitting   Grooming: Supervision/safety;Standing;Wash/dry hands Grooming Details (indicate cue type and reason): with single UE support at sink during hand washing             Lower Body Dressing: Minimal assistance;Sit to/from stand Lower Body Dressing Details (indicate cue type and reason): requires assistance to apply limb guard for extended L knee position Toilet Transfer: Supervision/safety;Ambulation;RW Toilet Transfer Details (indicate cue type and reason): simulated in room with sit<>stand followed by mobility into bathroom; pt able to open door safely.       Tub/Shower Transfer Details (indicate cue type and reason): educated pt to have home health therapist review shower transfers once cleared by MD as unable to participate in these at this time due to wound vac Functional mobility during ADLs: Supervision/safety;Rolling walker General ADL Comments: Pt and wife educated concerning safe strategies for dressing and bathing at this time. Educated on no showering until cleared by MD. Venita Lick concerning need for increased safety awareness.      Vision       Perception  Praxis      Cognition Arousal/Alertness: Awake/alert Behavior During Therapy: Impulsive Overall Cognitive Status: Impaired/Different from baseline Area of Impairment: Safety/judgement;Awareness                          Safety/Judgement: Decreased awareness of safety Awareness: Emergent   General Comments: Pt impulsive during session and requiring cues throughout for awareness and safety.         Exercises Amputee Exercises Hip Extension: AROM;Left;10 reps;Standing(x3 reps then patient fatigued and requested to sit down.  )   Shoulder Instructions       General Comments Pt's wife and granddaughter present during session.     Pertinent Vitals/ Pain       Pain Assessment: Faces Pain Score: 4  Faces Pain Scale: Hurts a little bit Pain Location: L residual limb Pain Descriptors / Indicators: Discomfort(reports phantom sensations) Pain Intervention(s): Limited activity within patient's tolerance;Monitored during session;Repositioned  Home Living                                          Prior Functioning/Environment              Frequency  Min 3X/week        Progress Toward Goals  OT Goals(current goals can now be found in the care plan section)  Progress towards OT goals: Progressing toward goals  Acute Rehab OT Goals Patient Stated Goal: to get a prosthesis OT Goal Formulation: With patient/family Time For Goal Achievement: 02/13/18 Potential to Achieve Goals: Good ADL Goals Pt Will Perform Lower Body Bathing: with modified independence Pt Will Perform Lower Body Dressing: with modified independence Pt Will Transfer to Toilet: with modified independence;ambulating Pt Will Perform Toileting - Clothing Manipulation and hygiene: with modified independence Pt Will Perform Tub/Shower Transfer: Shower transfer;with supervision;ambulating;shower seat;rolling walker  Plan Discharge plan remains appropriate    Co-evaluation                 AM-PAC PT "6 Clicks" Daily Activity     Outcome Measure   Help from another person eating meals?: None Help from another person taking care of personal grooming?: None Help from another person toileting,  which includes using toliet, bedpan, or urinal?: A Little Help from another person bathing (including washing, rinsing, drying)?: A Little Help from another person to put on and taking off regular upper body clothing?: None Help from another person to put on and taking off regular lower body clothing?: A Little 6 Click Score: 21    End of Session Equipment Utilized During Treatment: Rolling walker  OT Visit Diagnosis: Muscle weakness (generalized) (M62.81);Pain Pain - Right/Left: Left Pain - part of body: Leg   Activity Tolerance Patient tolerated treatment well   Patient Left in chair;with call bell/phone within reach;with family/visitor present   Nurse Communication Mobility status        Time: 1125-1140 OT Time Calculation (min): 15 min  Charges: OT General Charges $OT Visit: 1 Visit OT Treatments $Self Care/Home Management : 8-22 mins  Norman Herrlich, MS OTR/L  Pager: Troy [name] 01/31/2018, 12:16 PM

## 2018-02-01 ENCOUNTER — Inpatient Hospital Stay (INDEPENDENT_AMBULATORY_CARE_PROVIDER_SITE_OTHER): Payer: Medicare HMO | Admitting: Family

## 2018-02-01 ENCOUNTER — Inpatient Hospital Stay (INDEPENDENT_AMBULATORY_CARE_PROVIDER_SITE_OTHER): Payer: Medicare HMO | Admitting: Orthopedic Surgery

## 2018-02-01 ENCOUNTER — Telehealth (INDEPENDENT_AMBULATORY_CARE_PROVIDER_SITE_OTHER): Payer: Self-pay

## 2018-02-01 NOTE — Telephone Encounter (Signed)
Ronalee Belts with [facility] would like verbal orders for HHPT for patient 2 x a week for 2 weeks, and 1 x week for 1 week.  CB# is [phone].  Please advise.  Thank You.

## 2018-02-01 NOTE — Telephone Encounter (Signed)
I called and gave verbal ok for PT orders asked about wound vac and when it needs to come off. Left BKA on 01/25/18. Per operative report prevenna to stay intact for 2 weeks and will be remove on the day of his post op appt on 02/08/18

## 2018-02-07 ENCOUNTER — Telehealth (INDEPENDENT_AMBULATORY_CARE_PROVIDER_SITE_OTHER): Payer: Self-pay

## 2018-02-07 NOTE — Telephone Encounter (Signed)
Troy [name] with [facility] wanted to let Dr. Sharol Given know that patient has not had a BM since being home from the hospital, patient is taking 2 different laxatives.  Stated that patient had a fall on Saturday, 02/04/18, but no injuries.  Troy [name]'s CB# is [phone].  Please call patient to advise on any directions.  Thank You.

## 2018-02-08 ENCOUNTER — Ambulatory Visit (INDEPENDENT_AMBULATORY_CARE_PROVIDER_SITE_OTHER): Payer: Medicare HMO | Admitting: Family

## 2018-02-08 ENCOUNTER — Encounter (INDEPENDENT_AMBULATORY_CARE_PROVIDER_SITE_OTHER): Payer: Self-pay | Admitting: Family

## 2018-02-08 DIAGNOSIS — Z89512 Acquired absence of left leg below knee: Secondary | ICD-10-CM

## 2018-02-08 DIAGNOSIS — IMO0002 Reserved for concepts with insufficient information to code with codable children: Secondary | ICD-10-CM

## 2018-02-08 NOTE — Telephone Encounter (Signed)
Pt was evaluated in the office today.  ?

## 2018-02-08 NOTE — Progress Notes (Signed)
Post-Op Visit Note   Patient: Troy [name]           Date of Birth: Apr 23, 1961           MRN: [id number] Visit Date: 02/08/2018 PCP: [name], MD  Chief Complaint:  Chief Complaint  Patient presents with  . Left Knee - Routine Post Op    HPI:  HPI The patient is a 57 year old gentleman 1 week status post left below the knee amputation.  Has been doing well however he has not had a bowel movement in 9 days.  Wound VAC in place today. Ortho Exam  Incision well approximated with staples.  There is scant bloody drainage no erythema no odor no purulence no sign of infection.  Visit Diagnoses:  1. Below knee amputation status, left (HCC)     Plan: Begin daily Dial soap cleansing.  Apply dry dressing.  Wear shrinker daily.  Discussed stump protector.  Will trial MiraLAX as well as magnesium citrate for bowel movement.  Encourage fluids.  Discussed narcotics.  Follow-Up Instructions: Return in about 2 weeks (around 02/22/2018).   Imaging: No results found.  Orders:  No orders of the defined types were placed in this encounter.  No orders of the defined types were placed in this encounter.    PMFS History: Patient Active Problem List   Diagnosis Date Noted  . Below knee amputation status, left ([place]) 01/25/2018  . Wound dehiscence, surgical   . Foot amputation status, left ([place]) 01/03/2018  . Status post left foot surgery 12/15/2017  . Snoring 06/07/2016  . Chest pain 01/22/2016  . Abnormal nuclear stress test 01/22/2016  . COPD (chronic obstructive pulmonary disease) ([place]) 01/27/2011  . Pre-operative cardiovascular examination 01/27/2011  . Nonspecific abnormal electrocardiogram (ECG) (EKG) 01/27/2011  . Murmur 01/27/2011  . DM2 (diabetes mellitus, type 2) ([place])   . HTN (hypertension)   . HLD (hyperlipidemia)   . Tobacco abuse    Past Medical History:  Diagnosis Date  . Acute renal failure ([place])    in setting of NSAID use and orthopedic surgery 2010  .  Anxiety and depression   . Chronic diastolic CHF (congestive heart failure) ([place])    a. Echo 6/17: severe conc LVH, vigorous EF, EF 65-70%, no dynamic obstruction, no RWMA, Gr 1 DD, mild TR  //  b. LHC 8/17: no sig CAD, LVEDP 28  . COPD (chronic obstructive pulmonary disease) ([place])   . Diabetic ulcer of left foot ([place])   . DM2 (diabetes mellitus, type 2) ([place])   . Family history of early CAD   . Fatty liver   . GERD (gastroesophageal reflux disease)   . History of amputation of foot ([place])    L trans-met // R toe  . History of cardiac catheterization    a. [place] 2002: irregs  //  b. LHC in 8/17: no sig CAD, apical DK, hyperdynamic LV, LVEDP 28  . History of kidney stones   . History of nuclear stress test    a. Nuc 7/17: Overall, intermediate risk nuclear stress test secondary to small size of apical lateral defect and reduced ejection fraction.  EF 43%  . HLD (hyperlipidemia)   . HTN (hypertension)   . Injuries     crushing injury to both his feet in February 2010.   Marland Kitchen Kidney calculi   . Palpitations   . Tobacco abuse     Family History  Problem Relation Age of Onset  . Leukemia Mother 22  died  . Lung cancer Father 67       died  . Heart attack Brother 66  . Heart attack Brother 70  . Hypertension Brother        X3  . Hypertension Sister        X16  . Diabetes Sister   . Stroke Sister   . Diabetes Sister   . Other Brother        Musician accident    Past Surgical History:  Procedure Laterality Date  . AMPUTATION Left 01/03/2018   Procedure: LEFT MIDFOOT AMPUTATION/REVISION MIDAMPUTAION;  Surgeon: Mcarthur Rossetti, MD;  Location: [place];  Service: Orthopedics;  Laterality: Left;  . AMPUTATION Left 01/25/2018   Procedure: LEFT BELOW KNEE AMPUTATION;  Surgeon: [name], MD;  Location: [place];  Service: Orthopedics;  Laterality: Left;  . BELOW KNEE LEG AMPUTATION Left 01/25/2018  . CARDIAC CATHETERIZATION N/A 01/22/2016   Procedure: Left Heart Cath and Coronary  Angiography;  Surgeon: Peter M Martinique, MD;  Location: [place] [facility];  Service: Cardiovascular;  Laterality: N/A;  . FOOT AMPUTATION Bilateral   . I&D EXTREMITY Left 12/15/2017   Procedure: IRRIGATION AND DEBRIDEMENT LEFT FOOT ULCER;  Surgeon: Mcarthur Rossetti, MD;  [facility];  Service: Orthopedics;  Laterality: Left;  . LITHOTRIPSY    . TENDON LENGTHENING Bilateral    calf  . TONSILLECTOMY     Social History   Occupational History  . Occupation: DISABLED  Tobacco Use  . Smoking status: Former Smoker    Packs/day: 0.50    Years: 40.00    Pack years: 20.00    Types: Cigarettes    Last attempt to quit: 01/23/2018    Years since quitting: 0.0  . Smokeless tobacco: Never Used  Substance and Sexual Activity  . Alcohol use: No  . Drug use: No  . Sexual activity: Not on file

## 2018-02-09 ENCOUNTER — Ambulatory Visit (INDEPENDENT_AMBULATORY_CARE_PROVIDER_SITE_OTHER): Payer: Medicare HMO | Admitting: Orthopaedic Surgery

## 2018-02-21 ENCOUNTER — Encounter (INDEPENDENT_AMBULATORY_CARE_PROVIDER_SITE_OTHER): Payer: Self-pay | Admitting: Orthopedic Surgery

## 2018-02-21 ENCOUNTER — Ambulatory Visit (INDEPENDENT_AMBULATORY_CARE_PROVIDER_SITE_OTHER): Payer: Medicare HMO | Admitting: Orthopedic Surgery

## 2018-02-21 VITALS — Ht 71.0 in | Wt 225.0 lb

## 2018-02-21 DIAGNOSIS — Z89512 Acquired absence of left leg below knee: Secondary | ICD-10-CM

## 2018-02-21 DIAGNOSIS — IMO0002 Reserved for concepts with insufficient information to code with codable children: Secondary | ICD-10-CM

## 2018-02-21 NOTE — Progress Notes (Signed)
Office Visit Note   Patient: Troy [name]           Date of Birth: 1961-06-19           MRN: [id number] Visit Date: 02/21/2018              Requested by: [name], MD [address] [place], [place] [address] PCP: [name], MD  Chief Complaint  Patient presents with  . Left Leg - Routine Post Op    01/25/18 BKA      HPI: Patient is a 57 year old gentleman who was seen in follow-up status post left transtibial amputation.  He has no complaints.  Assessment & Plan: Visit Diagnoses:  1. Below knee amputation status, left [facility])     Plan: Patient will follow-up with [facility] for a 2 extra-large stump shrinker.  Anticipate being fit for prosthesis soon.  Follow-Up Instructions: Return in about 4 weeks (around 03/21/2018).   Ortho Exam  Patient is alert, oriented, no adenopathy, well-dressed, normal affect, normal respiratory effort. Examination patient has excellent consolidation of the residual limb there is no swelling no cellulitis the incision is well-healed the sutures are harvested.  Patient has essentially no swelling.  He has full extension.  Imaging: No results found. No images are attached to the encounter.  Labs: Lab Results  Component Value Date   HGBA1C 11.0 (H) 12/13/2017     Lab Results  Component Value Date   ALBUMIN 3.8 05/09/2011   ALBUMIN 2.7 (L) 08/15/2008   ALBUMIN 2.6 (L) 08/13/2008    Body mass index is 31.38 kg/m.  Orders:  No orders of the defined types were placed in this encounter.  No orders of the defined types were placed in this encounter.    Procedures: No procedures performed  Clinical Data: No additional findings.  ROS:  All other systems negative, except as noted in the HPI. Review of Systems  Objective: Vital Signs: Ht _0  (1.803 m)   Wt 225 lb (102.1 kg)   BMI 31.38 kg/m   Specialty Comments:  No specialty comments available.  PMFS History: Patient Active Problem List   Diagnosis Date  Noted  . Below knee amputation status, left ([place]) 01/25/2018  . Wound dehiscence, surgical   . Foot amputation status, left ([place]) 01/03/2018  . Status post left foot surgery 12/15/2017  . Snoring 06/07/2016  . Chest pain 01/22/2016  . Abnormal nuclear stress test 01/22/2016  . COPD (chronic obstructive pulmonary disease) ([place]) 01/27/2011  . Pre-operative cardiovascular examination 01/27/2011  . Nonspecific abnormal electrocardiogram (ECG) (EKG) 01/27/2011  . Murmur 01/27/2011  . DM2 (diabetes mellitus, type 2) ([place])   . HTN (hypertension)   . HLD (hyperlipidemia)   . Tobacco abuse    Past Medical History:  Diagnosis Date  . Acute renal failure ([place])    in setting of NSAID use and orthopedic surgery 2010  . Anxiety and depression   . Chronic diastolic CHF (congestive heart failure) ([place])    a. Echo 6/17: severe conc LVH, vigorous EF, EF 65-70%, no dynamic obstruction, no RWMA, Gr 1 DD, mild TR  //  b. LHC 8/17: no sig CAD, LVEDP 28  . COPD (chronic obstructive pulmonary disease) ([place])   . Diabetic ulcer of left foot ([place])   . DM2 (diabetes mellitus, type 2) ([place])   . Family history of early CAD   . Fatty liver   . GERD (gastroesophageal reflux disease)   . History of amputation of foot ([place])  L trans-met // R toe  . History of cardiac catheterization    a. [place] 2002: irregs  //  b. LHC in 8/17: no sig CAD, apical DK, hyperdynamic LV, LVEDP 28  . History of kidney stones   . History of nuclear stress test    a. Nuc 7/17: Overall, intermediate risk nuclear stress test secondary to small size of apical lateral defect and reduced ejection fraction.  EF 43%  . HLD (hyperlipidemia)   . HTN (hypertension)   . Injuries     crushing injury to both his feet in February 2010.   Marland Kitchen Kidney calculi   . Palpitations   . Tobacco abuse     Family History  Problem Relation Age of Onset  . Leukemia Mother 75       died  . Lung cancer Father 59       died  . Heart attack Brother 77  .  Heart attack Brother 86  . Hypertension Brother        X3  . Hypertension Sister        X71  . Diabetes Sister   . Stroke Sister   . Diabetes Sister   . Other Brother        Musician accident    Past Surgical History:  Procedure Laterality Date  . AMPUTATION Left 01/03/2018   Procedure: LEFT MIDFOOT AMPUTATION/REVISION MIDAMPUTAION;  Surgeon: Mcarthur Rossetti, MD;  Location: [place];  Service: Orthopedics;  Laterality: Left;  . AMPUTATION Left 01/25/2018   Procedure: LEFT BELOW KNEE AMPUTATION;  Surgeon: [name], MD;  Location: [place];  Service: Orthopedics;  Laterality: Left;  . BELOW KNEE LEG AMPUTATION Left 01/25/2018  . CARDIAC CATHETERIZATION N/A 01/22/2016   Procedure: Left Heart Cath and Coronary Angiography;  Surgeon: Peter M Martinique, MD;  Location: [place] [facility];  Service: Cardiovascular;  Laterality: N/A;  . FOOT AMPUTATION Bilateral   . I&D EXTREMITY Left 12/15/2017   Procedure: IRRIGATION AND DEBRIDEMENT LEFT FOOT ULCER;  Surgeon: Mcarthur Rossetti, MD;  [facility];  Service: Orthopedics;  Laterality: Left;  . LITHOTRIPSY    . TENDON LENGTHENING Bilateral    calf  . TONSILLECTOMY     Social History   Occupational History  . Occupation: DISABLED  Tobacco Use  . Smoking status: Former Smoker    Packs/day: 0.50    Years: 40.00    Pack years: 20.00    Types: Cigarettes    Last attempt to quit: 01/23/2018    Years since quitting: 0.0  . Smokeless tobacco: Never Used  Substance and Sexual Activity  . Alcohol use: No  . Drug use: No  . Sexual activity: Not on file

## 2018-02-22 ENCOUNTER — Other Ambulatory Visit (INDEPENDENT_AMBULATORY_CARE_PROVIDER_SITE_OTHER): Payer: Self-pay

## 2018-02-22 ENCOUNTER — Telehealth (INDEPENDENT_AMBULATORY_CARE_PROVIDER_SITE_OTHER): Payer: Self-pay | Admitting: Orthopedic Surgery

## 2018-02-22 NOTE — Telephone Encounter (Signed)
This has been done faxed to [facility].

## 2018-02-22 NOTE — Telephone Encounter (Signed)
[name] called stating that he has an appointment with [facility].  She was advised that [facility] needs a prescription sent to them.  Patient has an appointment tomorrow at 10:00.  CB#[phone].  Thank you.

## 2018-03-09 ENCOUNTER — Other Ambulatory Visit (INDEPENDENT_AMBULATORY_CARE_PROVIDER_SITE_OTHER): Payer: Self-pay

## 2018-03-21 ENCOUNTER — Encounter (INDEPENDENT_AMBULATORY_CARE_PROVIDER_SITE_OTHER): Payer: Self-pay | Admitting: Orthopedic Surgery

## 2018-03-21 ENCOUNTER — Ambulatory Visit (INDEPENDENT_AMBULATORY_CARE_PROVIDER_SITE_OTHER): Payer: Medicare HMO | Admitting: Orthopedic Surgery

## 2018-03-21 VITALS — Ht 71.0 in | Wt 225.0 lb

## 2018-03-21 DIAGNOSIS — T8131XD Disruption of external operation (surgical) wound, not elsewhere classified, subsequent encounter: Secondary | ICD-10-CM

## 2018-03-21 DIAGNOSIS — S88112A Complete traumatic amputation at level between knee and ankle, left lower leg, initial encounter: Secondary | ICD-10-CM

## 2018-03-21 NOTE — Progress Notes (Signed)
Office Visit Note   Patient: Troy [name]           Date of Birth: December 30, 1960           MRN: [id number] Visit Date: 03/21/2018              Requested by: [name], MD [address] [place], [place] [address] PCP: [name], MD  Chief Complaint  Patient presents with  . Left Leg - Routine Post Op    BKA of Left leg      HPI: Patient is 2 months status post left transtibial amputation.  Patient has a follow-up appointment with [facility] tomorrow to be fit for prosthesis.  Patient complains of 2 small blister openings on the residual limb he is not on antibiotics.  Assessment & Plan: Visit Diagnoses:  1. Postoperative wound dehiscence, subsequent encounter   2. Below-knee amputation of left lower extremity ([place])     Plan: Retained suture was removed.  There is no signs of abscess or infection.  Patient will wash these 2 wounds with soap and water daily apply Bactroban gauze and his stump shrinker.  Follow-Up Instructions: Return in about 4 weeks (around 04/18/2018).   Ortho Exam  Patient is alert, oriented, no adenopathy, well-dressed, normal affect, normal respiratory effort. Examination patient has 2 small wounds medially where the retained sutures were for the subcuticular sutures were.  Patient also has a very small cyst over the tibial crest.  After informed consent and sterile prepping a small retained suture was removed from the medial wound.  There is no abscess no cellulitis no signs of infection.  The tibial wound is about 1 mm diameter and appears to be more of a sebaceous cyst possibly from the compression stocking  Imaging: No results found. No images are attached to the encounter.  Labs: Lab Results  Component Value Date   HGBA1C 11.0 (H) 12/13/2017     Lab Results  Component Value Date   ALBUMIN 3.8 05/09/2011   ALBUMIN 2.7 (L) 08/15/2008   ALBUMIN 2.6 (L) 08/13/2008    Body mass index is 31.38 kg/m.  Orders:  No orders of the  defined types were placed in this encounter.  No orders of the defined types were placed in this encounter.    Procedures: No procedures performed  Clinical Data: No additional findings.  ROS:  All other systems negative, except as noted in the HPI. Review of Systems  Objective: Vital Signs: Ht 5' 11"  (1.803 m)   Wt 225 lb (102.1 kg)   BMI 31.38 kg/m   Specialty Comments:  No specialty comments available.  PMFS History: Patient Active Problem List   Diagnosis Date Noted  . Below knee amputation status, left 01/25/2018  . Wound dehiscence, surgical   . Foot amputation status, left 01/03/2018  . Status post left foot surgery 12/15/2017  . Snoring 06/07/2016  . Chest pain 01/22/2016  . Abnormal nuclear stress test 01/22/2016  . COPD (chronic obstructive pulmonary disease) ([place]) 01/27/2011  . Pre-operative cardiovascular examination 01/27/2011  . Nonspecific abnormal electrocardiogram (ECG) (EKG) 01/27/2011  . Murmur 01/27/2011  . DM2 (diabetes mellitus, type 2) ([place])   . HTN (hypertension)   . HLD (hyperlipidemia)   . Tobacco abuse    Past Medical History:  Diagnosis Date  . Acute renal failure ([place])    in setting of NSAID use and orthopedic surgery 2010  . Anxiety and depression   . Chronic diastolic CHF (congestive heart failure) ([place])  a. Echo 6/17: severe conc LVH, vigorous EF, EF 65-70%, no dynamic obstruction, no RWMA, Gr 1 DD, mild TR  //  b. LHC 8/17: no sig CAD, LVEDP 28  . COPD (chronic obstructive pulmonary disease) ([place])   . Diabetic ulcer of left foot ([place])   . DM2 (diabetes mellitus, type 2) ([place])   . Family history of early CAD   . Fatty liver   . GERD (gastroesophageal reflux disease)   . History of amputation of foot ([place])    L trans-met // R toe  . History of cardiac catheterization    a. [place] 2002: irregs  //  b. LHC in 8/17: no sig CAD, apical DK, hyperdynamic LV, LVEDP 28  . History of kidney stones   . History of nuclear stress test      a. Nuc 7/17: Overall, intermediate risk nuclear stress test secondary to small size of apical lateral defect and reduced ejection fraction.  EF 43%  . HLD (hyperlipidemia)   . HTN (hypertension)   . Injuries     crushing injury to both his feet in February 2010.   Marland Kitchen Kidney calculi   . Palpitations   . Tobacco abuse     Family History  Problem Relation Age of Onset  . Leukemia Mother 59       died  . Lung cancer Father 61       died  . Heart attack Brother 85  . Heart attack Brother 71  . Hypertension Brother        X3  . Hypertension Sister        X65  . Diabetes Sister   . Stroke Sister   . Diabetes Sister   . Other Brother        Musician accident    Past Surgical History:  Procedure Laterality Date  . AMPUTATION Left 01/03/2018   Procedure: LEFT MIDFOOT AMPUTATION/REVISION MIDAMPUTAION;  Surgeon: Mcarthur Rossetti, MD;  Location: [place];  Service: Orthopedics;  Laterality: Left;  . AMPUTATION Left 01/25/2018   Procedure: LEFT BELOW KNEE AMPUTATION;  Surgeon: [name], MD;  Location: [facility];  Service: Orthopedics;  Laterality: Left;  . BELOW KNEE LEG AMPUTATION Left 01/25/2018  . CARDIAC CATHETERIZATION N/A 01/22/2016   Procedure: Left Heart Cath and Coronary Angiography;  Surgeon: Peter M Martinique, MD;  Location: [facility];  Service: Cardiovascular;  Laterality: N/A;  . FOOT AMPUTATION Bilateral   . I&D EXTREMITY Left 12/15/2017   Procedure: IRRIGATION AND DEBRIDEMENT LEFT FOOT ULCER;  Surgeon: Mcarthur Rossetti, MD;  [facility];  Service: Orthopedics;  Laterality: Left;  . LITHOTRIPSY    . TENDON LENGTHENING Bilateral    calf  . TONSILLECTOMY     Social History   Occupational History  . Occupation: DISABLED  Tobacco Use  . Smoking status: Former Smoker    Packs/day: 0.50    Years: 40.00    Pack years: 20.00    Types: Cigarettes    Last attempt to quit: 01/23/2018    Years since quitting: 0.1  . Smokeless tobacco: Never Used  Substance and  Sexual Activity  . Alcohol use: No  . Drug use: No  . Sexual activity: Not on file

## 2018-03-28 ENCOUNTER — Encounter (INDEPENDENT_AMBULATORY_CARE_PROVIDER_SITE_OTHER): Payer: Self-pay | Admitting: Orthopedic Surgery

## 2018-03-28 ENCOUNTER — Ambulatory Visit (INDEPENDENT_AMBULATORY_CARE_PROVIDER_SITE_OTHER): Payer: Medicare HMO | Admitting: Orthopedic Surgery

## 2018-03-28 VITALS — Ht 71.0 in | Wt 225.0 lb

## 2018-03-28 DIAGNOSIS — S88112A Complete traumatic amputation at level between knee and ankle, left lower leg, initial encounter: Secondary | ICD-10-CM

## 2018-03-28 NOTE — Progress Notes (Signed)
 Office Visit Note   Patient: Troy [name]           Date of Birth: 06/13/1961           MRN: [id number] Visit Date: 03/28/2018              Requested by: Nyland, Leonard, MD [address] [place], [place] [address] PCP: Nyland, Leonard, MD  Chief Complaint  Patient presents with  . Left Leg - Follow-up, Pain    Left BKA      HPI: Patient is a 57-year-old gentleman who is status post left transtibial amputation.  Patient states he fell on his residual limb today.  Patient denies any syncopal event denies any loss of consciousness.  Patient states the crutches slipped out from under him.  Assessment & Plan: Visit Diagnoses:  1. Below-knee amputation of left lower extremity (HCC)     Plan: Patient will follow-up with [facility] for prosthetic fitting next week he will continue with his stump shrinker will use his wheelchair for ambulation.  Follow-up with [facility] for gait Training.    Follow-Up Instructions: Return in about 3 weeks (around 04/18/2018).   Ortho Exam  Patient is alert, oriented, no adenopathy, well-dressed, normal affect, normal respiratory effort. Examination patient has 2 bruises one medially and one anteriorly over the tibia.  There is no full-thickness skin defect no drainage no cellulitis knee is nontender to palpation no evidence of a fracture.  Imaging: No results found. No images are attached to the encounter.  Labs: Lab Results  Component Value Date   HGBA1C 11.0 (H) 12/13/2017     Lab Results  Component Value Date   ALBUMIN 3.8 05/09/2011   ALBUMIN 2.7 (L) 08/15/2008   ALBUMIN 2.6 (L) 08/13/2008    Body mass index is 31.38 kg/m.  Orders:  No orders of the defined types were placed in this encounter.  No orders of the defined types were placed in this encounter.    Procedures: No procedures performed  Clinical Data: No additional findings.  ROS:  All other systems negative, except as noted in the HPI. Review of  Systems  Objective: Vital Signs: Ht 5' 11" (1.803 m)   Wt 225 lb (102.1 kg)   BMI 31.38 kg/m   Specialty Comments:  No specialty comments available.  PMFS History: Patient Active Problem List   Diagnosis Date Noted  . Below knee amputation status, left 01/25/2018  . Wound dehiscence, surgical   . Foot amputation status, left 01/03/2018  . Status post left foot surgery 12/15/2017  . Snoring 06/07/2016  . Chest pain 01/22/2016  . Abnormal nuclear stress test 01/22/2016  . COPD (chronic obstructive pulmonary disease) (HCC) 01/27/2011  . Pre-operative cardiovascular examination 01/27/2011  . Nonspecific abnormal electrocardiogram (ECG) (EKG) 01/27/2011  . Murmur 01/27/2011  . DM2 (diabetes mellitus, type 2) (HCC)   . HTN (hypertension)   . HLD (hyperlipidemia)   . Tobacco abuse    Past Medical History:  Diagnosis Date  . Acute renal failure (HCC)    in setting of NSAID use and orthopedic surgery 2010  . Anxiety and depression   . Chronic diastolic CHF (congestive heart failure) (HCC)    a. Echo 6/17: severe conc LVH, vigorous EF, EF 65-70%, no dynamic obstruction, no RWMA, Gr 1 DD, mild TR  //  b. LHC 8/17: no sig CAD, LVEDP 28  . COPD (chronic obstructive pulmonary disease) (HCC)   . Diabetic ulcer of left foot (HCC)   . DM2 (  diabetes mellitus, type 2) ([facility])   . Family history of early CAD   . Fatty liver   . GERD (gastroesophageal reflux disease)   . History of amputation of foot ([place])    L trans-met // R toe  . History of cardiac catheterization    a. [place] 2002: irregs  //  b. LHC in 8/17: no sig CAD, apical DK, hyperdynamic LV, LVEDP 28  . History of kidney stones   . History of nuclear stress test    a. Nuc 7/17: Overall, intermediate risk nuclear stress test secondary to small size of apical lateral defect and reduced ejection fraction.  EF 43%  . HLD (hyperlipidemia)   . HTN (hypertension)   . Injuries     crushing injury to both his feet in February 2010.   Marland Kitchen  Kidney calculi   . Palpitations   . Tobacco abuse     Family History  Problem Relation Age of Onset  . Leukemia Mother 56       died  . Lung cancer Father 35       died  . Heart attack Brother 53  . Heart attack Brother 77  . Hypertension Brother        X3  . Hypertension Sister        X10  . Diabetes Sister   . Stroke Sister   . Diabetes Sister   . Other Brother        Musician accident    Past Surgical History:  Procedure Laterality Date  . AMPUTATION Left 01/03/2018   Procedure: LEFT MIDFOOT AMPUTATION/REVISION MIDAMPUTAION;  Surgeon: Mcarthur Rossetti, MD;  Location: [facility];  Service: Orthopedics;  Laterality: Left;  . AMPUTATION Left 01/25/2018   Procedure: LEFT BELOW KNEE AMPUTATION;  Surgeon: [name], MD;  Location: [facility];  Service: Orthopedics;  Laterality: Left;  . BELOW KNEE LEG AMPUTATION Left 01/25/2018  . CARDIAC CATHETERIZATION N/A 01/22/2016   Procedure: Left Heart Cath and Coronary Angiography;  Surgeon: Peter M Martinique, MD;  Location: [facility];  Service: Cardiovascular;  Laterality: N/A;  . FOOT AMPUTATION Bilateral   . I&D EXTREMITY Left 12/15/2017   Procedure: IRRIGATION AND DEBRIDEMENT LEFT FOOT ULCER;  Surgeon: Mcarthur Rossetti, MD;  Location: [facility];  Service: Orthopedics;  Laterality: Left;  . LITHOTRIPSY    . TENDON LENGTHENING Bilateral    calf  . TONSILLECTOMY     Social History   Occupational History  . Occupation: DISABLED  Tobacco Use  . Smoking status: Former Smoker    Packs/day: 0.50    Years: 40.00    Pack years: 20.00    Types: Cigarettes    Last attempt to quit: 01/23/2018    Years since quitting: 0.1  . Smokeless tobacco: Never Used  Substance and Sexual Activity  . Alcohol use: No  . Drug use: No  . Sexual activity: Not on file

## 2018-04-07 ENCOUNTER — Other Ambulatory Visit: Payer: Self-pay | Admitting: Internal Medicine

## 2018-04-07 DIAGNOSIS — I10 Essential (primary) hypertension: Secondary | ICD-10-CM

## 2018-04-14 ENCOUNTER — Other Ambulatory Visit (INDEPENDENT_AMBULATORY_CARE_PROVIDER_SITE_OTHER): Payer: Self-pay | Admitting: Physician Assistant

## 2018-04-14 DIAGNOSIS — S88112A Complete traumatic amputation at level between knee and ankle, left lower leg, initial encounter: Secondary | ICD-10-CM

## 2018-04-14 NOTE — Progress Notes (Signed)
[facility] requested orders for PT for [facility] and orders placed in Epic.

## 2018-04-18 ENCOUNTER — Ambulatory Visit (INDEPENDENT_AMBULATORY_CARE_PROVIDER_SITE_OTHER): Payer: Medicare HMO | Admitting: Physician Assistant

## 2018-04-18 ENCOUNTER — Encounter (INDEPENDENT_AMBULATORY_CARE_PROVIDER_SITE_OTHER): Payer: Self-pay | Admitting: Orthopedic Surgery

## 2018-04-18 VITALS — Ht 71.0 in | Wt 225.0 lb

## 2018-04-18 DIAGNOSIS — E1142 Type 2 diabetes mellitus with diabetic polyneuropathy: Secondary | ICD-10-CM

## 2018-04-18 DIAGNOSIS — S88112A Complete traumatic amputation at level between knee and ankle, left lower leg, initial encounter: Secondary | ICD-10-CM

## 2018-04-18 NOTE — Progress Notes (Signed)
 Office Visit Note   Patient: Troy [name]           Date of Birth: 01/19/1961           MRN: [id number] Visit Date: 04/18/2018              Requested by: Nyland, Leonard, MD [address] [place], [place] [address] PCP: Nyland, Leonard, MD  Chief Complaint  Patient presents with  . Left Leg - Routine Post Op    BKA of left leg      HPI: The patient is a 57 yo male who is seen for post operative follow up following left transtibial amputation on  01/25/2018. He has been wearing his stump shrinker. He has some phantom pain, but could not tolerate increased doses of gabapentin. He reports he will obtain his prosthesis from [facility] on Friday and needs to start working with PT for gait training.   Assessment & Plan: Visit Diagnoses:  1. Below-knee amputation of left lower extremity (HCC)   2. Type 2 diabetes mellitus with diabetic polyneuropathy, unspecified whether long term insulin use (HCC)     Plan: Orders for Physical therapy provided for gait training. Continue to follow up with [facility] for prosthesis management. follow up in 2 months or sooner if difficulty in the interim.   Follow-Up Instructions: Return in about 2 months (around 06/18/2018).   Ortho Exam  Patient is alert, oriented, no adenopathy, well-dressed, normal affect, normal respiratory effort. Left transtibial amputation incision is well healed. Slight superficial abrasion over distal tibia. No signs of cellulitis. He has full knee extension and good flexion.   Imaging: No results found.   Labs: Lab Results  Component Value Date   HGBA1C 11.0 (H) 12/13/2017     Lab Results  Component Value Date   ALBUMIN 3.8 05/09/2011   ALBUMIN 2.7 (L) 08/15/2008   ALBUMIN 2.6 (L) 08/13/2008    Body mass index is 31.38 kg/m.  Orders:  Orders Placed This Encounter  Procedures  . Ambulatory referral to Physical Therapy   No orders of the defined types were placed in this encounter.    Procedures: No  procedures performed  Clinical Data: No additional findings.  ROS:  All other systems negative, except as noted in the HPI. Review of Systems  Objective: Vital Signs: Ht 5' 11" (1.803 m)   Wt 225 lb (102.1 kg)   BMI 31.38 kg/m   Specialty Comments:  No specialty comments available.  PMFS History: Patient Active Problem List   Diagnosis Date Noted  . Below knee amputation status, left 01/25/2018  . Wound dehiscence, surgical   . Foot amputation status, left 01/03/2018  . Status post left foot surgery 12/15/2017  . Snoring 06/07/2016  . Chest pain 01/22/2016  . Abnormal nuclear stress test 01/22/2016  . COPD (chronic obstructive pulmonary disease) (HCC) 01/27/2011  . Pre-operative cardiovascular examination 01/27/2011  . Nonspecific abnormal electrocardiogram (ECG) (EKG) 01/27/2011  . Murmur 01/27/2011  . DM2 (diabetes mellitus, type 2) (HCC)   . HTN (hypertension)   . HLD (hyperlipidemia)   . Tobacco abuse    Past Medical History:  Diagnosis Date  . Acute renal failure (HCC)    in setting of NSAID use and orthopedic surgery 2010  . Anxiety and depression   . Chronic diastolic CHF (congestive heart failure) (HCC)    a. Echo 6/17: severe conc LVH, vigorous EF, EF 65-70%, no dynamic obstruction, no RWMA, Gr 1 DD, mild TR  //  b.   LHC 8/17: no sig CAD, LVEDP 28  . COPD (chronic obstructive pulmonary disease) ([place])   . Diabetic ulcer of left foot ([place])   . DM2 (diabetes mellitus, type 2) ([place])   . Family history of early CAD   . Fatty liver   . GERD (gastroesophageal reflux disease)   . History of amputation of foot ([place])    L trans-met // R toe  . History of cardiac catheterization    a. [place] 2002: irregs  //  b. LHC in 8/17: no sig CAD, apical DK, hyperdynamic LV, LVEDP 28  . History of kidney stones   . History of nuclear stress test    a. Nuc 7/17: Overall, intermediate risk nuclear stress test secondary to small size of apical lateral defect and reduced ejection  fraction.  EF 43%  . HLD (hyperlipidemia)   . HTN (hypertension)   . Injuries     crushing injury to both his feet in February 2010.   Marland Kitchen Kidney calculi   . Palpitations   . Tobacco abuse     Family History  Problem Relation Age of Onset  . Leukemia Mother 69       died  . Lung cancer Father 15       died  . Heart attack Brother 75  . Heart attack Brother 34  . Hypertension Brother        X3  . Hypertension Sister        X41  . Diabetes Sister   . Stroke Sister   . Diabetes Sister   . Other Brother        Musician accident    Past Surgical History:  Procedure Laterality Date  . AMPUTATION Left 01/03/2018   Procedure: LEFT MIDFOOT AMPUTATION/REVISION MIDAMPUTAION;  Surgeon: Mcarthur Rossetti, MD;  Location: [facility];  Service: Orthopedics;  Laterality: Left;  . AMPUTATION Left 01/25/2018   Procedure: LEFT BELOW KNEE AMPUTATION;  Surgeon: [name], MD;  Location: [facility];  Service: Orthopedics;  Laterality: Left;  . BELOW KNEE LEG AMPUTATION Left 01/25/2018  . CARDIAC CATHETERIZATION N/A 01/22/2016   Procedure: Left Heart Cath and Coronary Angiography;  Surgeon: Peter M Martinique, MD;  Location: [facility];  Service: Cardiovascular;  Laterality: N/A;  . FOOT AMPUTATION Bilateral   . I&D EXTREMITY Left 12/15/2017   Procedure: IRRIGATION AND DEBRIDEMENT LEFT FOOT ULCER;  Surgeon: Mcarthur Rossetti, MD;  [facility];  Service: Orthopedics;  Laterality: Left;  . LITHOTRIPSY    . TENDON LENGTHENING Bilateral    calf  . TONSILLECTOMY     Social History   Occupational History  . Occupation: DISABLED  Tobacco Use  . Smoking status: Former Smoker    Packs/day: 0.50    Years: 40.00    Pack years: 20.00    Types: Cigarettes    Last attempt to quit: 01/23/2018    Years since quitting: 0.2  . Smokeless tobacco: Never Used  Substance and Sexual Activity  . Alcohol use: No  . Drug use: No  . Sexual activity: Not on file

## 2018-04-25 ENCOUNTER — Encounter: Payer: Self-pay | Admitting: Physical Therapy

## 2018-04-25 ENCOUNTER — Ambulatory Visit: Payer: Medicare HMO | Admitting: Physical Therapy

## 2018-04-25 ENCOUNTER — Other Ambulatory Visit: Payer: Self-pay

## 2018-04-25 ENCOUNTER — Ambulatory Visit: Payer: Medicare HMO | Attending: Physician Assistant | Admitting: Physical Therapy

## 2018-04-25 DIAGNOSIS — M6281 Muscle weakness (generalized): Secondary | ICD-10-CM | POA: Diagnosis present

## 2018-04-25 DIAGNOSIS — R2689 Other abnormalities of gait and mobility: Secondary | ICD-10-CM | POA: Diagnosis present

## 2018-04-25 DIAGNOSIS — R2681 Unsteadiness on feet: Secondary | ICD-10-CM | POA: Insufficient documentation

## 2018-04-25 DIAGNOSIS — R293 Abnormal posture: Secondary | ICD-10-CM | POA: Insufficient documentation

## 2018-04-25 DIAGNOSIS — R296 Repeated falls: Secondary | ICD-10-CM

## 2018-04-26 NOTE — Therapy (Signed)
[facility] [address] [place], [place], [address] Phone: [phone]   Fax:  [phone]  Physical Therapy Evaluation  Patient Details  Name: Troy [name] MRN: [id number] Date of Birth: 12/13/1960 Referring Provider (PT): Erlinda Hong, [place]   Encounter Date: 04/25/2018  PT End of Session - 04/25/18 1350    Visit Number  1    Number of Visits  26    Date for PT Re-Evaluation  07/21/18    Authorization Type  [employer] Medicare    Authorization Time Period  $40 co-pay,  $4200oop, $2363.66 remaining prior to PT eval    PT Start Time  1105    PT Stop Time  1153    PT Time Calculation (min)  48 min    Equipment Utilized During Treatment  Gait belt    Activity Tolerance  Patient tolerated treatment well;Patient limited by pain    Behavior During Therapy  [facility] for tasks assessed/performed       Past Medical History:  Diagnosis Date  . Acute renal failure ([place])    in setting of NSAID use and orthopedic surgery 2010  . Anxiety and depression   . Chronic diastolic CHF (congestive heart failure) ([place])    a. Echo 6/17: severe conc LVH, vigorous EF, EF 65-70%, no dynamic obstruction, no RWMA, Gr 1 DD, mild TR  //  b. LHC 8/17: no sig CAD, LVEDP 28  . COPD (chronic obstructive pulmonary disease) ([place])   . Diabetic ulcer of left foot ([place])   . DM2 (diabetes mellitus, type 2) ([place])   . Family history of early CAD   . Fatty liver   . GERD (gastroesophageal reflux disease)   . History of amputation of foot ([place])    L trans-met // R toe  . History of cardiac catheterization    a. [place] 2002: irregs  //  b. LHC in 8/17: no sig CAD, apical DK, hyperdynamic LV, LVEDP 28  . History of kidney stones   . History of nuclear stress test    a. Nuc 7/17: Overall, intermediate risk nuclear stress test secondary to small size of apical lateral defect and reduced ejection fraction.  EF 43%  . HLD (hyperlipidemia)   . HTN (hypertension)   .  Injuries     crushing injury to both his feet in February 2010.   Marland Kitchen Kidney calculi   . Palpitations   . Tobacco abuse     Past Surgical History:  Procedure Laterality Date  . AMPUTATION Left 01/03/2018   Procedure: LEFT MIDFOOT AMPUTATION/REVISION MIDAMPUTAION;  Surgeon: Mcarthur Rossetti, MD;  Location: [facility];  Service: Orthopedics;  Laterality: Left;  . AMPUTATION Left 01/25/2018   Procedure: LEFT BELOW KNEE AMPUTATION;  Surgeon: [name], MD;  Location: [facility];  Service: Orthopedics;  Laterality: Left;  . BELOW KNEE LEG AMPUTATION Left 01/25/2018  . CARDIAC CATHETERIZATION N/A 01/22/2016   Procedure: Left Heart Cath and Coronary Angiography;  Surgeon: Peter M Martinique, MD;  Location: [facility];  Service: Cardiovascular;  Laterality: N/A;  . FOOT AMPUTATION Bilateral   . I&D EXTREMITY Left 12/15/2017   Procedure: IRRIGATION AND DEBRIDEMENT LEFT FOOT ULCER;  Surgeon: Mcarthur Rossetti, MD;  Location: [facility];  Service: Orthopedics;  Laterality: Left;  . LITHOTRIPSY    . TENDON LENGTHENING Bilateral    calf  . TONSILLECTOMY      There were no vitals filed for this visit.   Subjective Assessment -  04/25/18 1110    Subjective  This 57yo male was referred by Milas Gain, PA on 04/14/2018 for PT evaluation with Below Knee Amputation. He underwent a left midfoot amputation 01/03/2018 and was revised to Left Transtibial Amputation on 01/25/2018. He received his prosthesis 04/21/2018.     Patient is accompained by:  Family member   wife, Brenin Heidelberger   Pertinent History  LTTA, DM2, neuropathy, COPD, HTN, HLD, CHF,    Limitations  Lifting;Standing;Walking;House hold activities    Patient Stated Goals  To use prosthesis to walk in community, fishing, go on vacations     Currently in Pain?  Yes    Pain Score  8    In last 5 days, worst 9/10, best 5/10   Pain Location  Leg   residual limb    Pain Orientation  Left    Pain Descriptors / Indicators  Burning     Pain Type  Chronic pain    Pain Onset  More than a month ago    Pain Frequency  Constant    Aggravating Factors   pressure on limb    Pain Relieving Factors  take prosthesis off    Multiple Pain Sites  Yes    Pain Score  8    Pain Location  Foot    Pain Orientation  Right    Pain Descriptors / Indicators  Aching    Pain Type  Chronic pain    Pain Onset  More than a month ago    Pain Frequency  Constant    Aggravating Factors   standing & walking    Pain Relieving Factors  sitting down         [facility] PT Assessment - 04/25/18 1100      Assessment   Medical Diagnosis  Left Transtibial Amputation    Referring Provider (PT)  Shawn [place], PA    Onset Date/Surgical Date  04/21/18   prosthesis delivery   Hand Dominance  Right    Prior Therapy  [facility]      Precautions   Precautions  Fall      Balance Screen   Has the patient fallen in the past 6 months  Yes    How many times?  4   no injuries,    Has the patient had a decrease in activity level because of a fear of falling?   Yes    Is the patient reluctant to leave their home because of a fear of falling?   Yes      [place] residence    Living Arrangements  Spouse/significant other;Children   33yo son & 9yo granddtr   Type of [place] to enter;Ramped entrance   step to porch & ramp over step from [employer] of Steps  1    Entrance Stairs-Rails  None    Home Layout  One level   single (~4") step into laundry & shower area   [place] - 2 wheels;Cane - single point;Crutches;Bedside commode;Shower seat;Toilet riser;Wheelchair - manual   using BSC as shower seat     Prior Function   Level of Independence  Independent;Independent with household mobility without device;Independent with community mobility without device    Vocation  On disability    Leisure  fishing, [place], watching tv, shooting guns      Posture/Postural  Control   Posture/Postural Control  Postural  limitations    Postural Limitations  Rounded Shoulders;Forward head;Flexed trunk;Weight shift right      ROM / Strength   AROM / PROM / Strength  AROM;Strength      AROM   Overall AROM   Within functional limits for tasks performed      Strength   Overall Strength  Within functional limits for tasks performed      Transfers   Transfers  Sit to Stand;Stand to Sit    Sit to Stand  5: Supervision;With upper extremity assist;With armrests;From chair/3-in-1   intermittent UE support to stablize   Stand to Sit  5: Supervision;With upper extremity assist;With armrests;To chair/3-in-1      Ambulation/Gait   Ambulation/Gait  Yes    Ambulation/Gait Assistance  4: Min guard;5: Supervision    Ambulation/Gait Assistance Details  excessive UE weight bearing on RW with partial weight on prosthesis    Ambulation Distance (Feet)  100 Feet    Assistive device  Rolling walker;Prosthesis    Gait Pattern  Step-to pattern;Decreased step length - right;Decreased stance time - left;Decreased stride length;Decreased hip/knee flexion - left;Decreased weight shift to left;Left hip hike;Antalgic;Trunk flexed;Abducted - left;Poor foot clearance - left    Ambulation Surface  Indoor;Level    Gait velocity  1.45 ft/sec      Standardized Balance Assessment   Standardized Balance Assessment  Berg Balance Test      Berg Balance Test   Sit to Stand  Able to stand using hands after several tries    Standing Unsupported  Able to stand 2 minutes with supervision    Sitting with Back Unsupported but Feet Supported on Floor or Stool  Able to sit safely and securely 2 minutes    Stand to Sit  Controls descent by using hands    Transfers  Able to transfer safely, definite need of hands    Standing Unsupported with Eyes Closed  Unable to keep eyes closed 3 seconds but stays steady    Standing Ubsupported with Feet Together  Needs help to attain position but able to stand for  30 seconds with feet together    From Standing, Reach Forward with Outstretched Arm  Reaches forward but needs supervision    From Standing Position, Pick up Object from Floor  Able to pick up shoe, needs supervision    From Standing Position, Turn to Look Behind Over each Shoulder  Needs supervision when turning    Turn 360 Degrees  Needs assistance while turning    Standing Unsupported, Alternately Place Feet on Step/Stool  Needs assistance to keep from falling or unable to try    Standing Unsupported, One Foot in Front  Loses balance while stepping or standing    Standing on One Leg  Able to lift leg independently and hold equal to or more than 3 seconds    Total Score  24      Prosthetics Assessment - 04/25/18 1110      Prosthetics   Prosthetic Care Dependent with  Skin check;Residual limb care;Care of non-amputated limb;Prosthetic cleaning;Ply sock cleaning;Correct ply sock adjustment;Proper wear schedule/adjustment;Proper weight-bearing schedule/adjustment    Donning prosthesis   Supervision    Doffing prosthesis   Supervision    Current prosthetic wear tolerance (days/week)   4 of 5 days since delivery    Current prosthetic wear tolerance (#hours/day)   10 minutes / wear    Current prosthetic weight-bearing tolerance (hours/day)   Patient reports residual limb pain 8/10 with standing & gait  with partial weight bearing on prosthesis    Edema  pitting edema    Residual limb condition   3 scab areas on incision, lateral 2 are dry and one at distal tibia needs Tegaderm cover.  no hair growth. normal color & temperature.                 Objective measurements completed on examination: See above findings.      [facility] Adult PT Treatment/Exercise - 04/25/18 1110      Prosthetics   Prosthetic Care Comments   PT instructed in Tegaderm use for distal tibia wound. PT recommended applying in am before first wear & removing after 2nd wear.  PT recommended daily wear 2 hrs 2x/day with  weekly increase if no issues.     Education Provided  Skin check;Residual limb care;Prosthetic cleaning;Correct ply sock adjustment;Proper Donning;Proper Doffing;Proper wear schedule/adjustment;Proper weight-bearing schedule/adjustment;Other (comment)   see prosthetic care comments   Person(s) Educated  Patient;Spouse    Education Method  Explanation;Demonstration;Tactile cues;Verbal cues    Education Method  Verbalized understanding;Returned demonstration;Tactile cues required;Verbal cues required;Needs further instruction               PT Short Term Goals - 04/25/18 1800      PT SHORT TERM GOAL #1   Title  Patient verbalizes proper cleaning & demonstrates donning of prosthesis. (All STGs Target Date: 05/25/2018)    Time  1    Period  Months    Status  New    Target Date  05/25/18      PT SHORT TERM GOAL #2   Title  Patient tolerates wear >8 hrs total /day without increased skin issues or limb pain >6/10.     Time  1    Period  Months    Status  New    Target Date  05/25/18      PT SHORT TERM GOAL #3   Title  Patient standing balance without UE support reaching 5", scanning environment and picking up objects from floor with supervision.     Time  1    Period  Months    Status  New    Target Date  05/25/18      PT SHORT TERM GOAL #4   Title  Patient ambulates 300' with RW & prosthesis with supervision.     Time  1    Period  Months    Status  New    Target Date  05/25/18      PT SHORT TERM GOAL #5   Title  Patient negotiates ramps, curbs with RW & stairs with 1 rail & cane with supervision.     Time  1    Period  Months    Status  New    Target Date  05/25/18        PT Long Term Goals - 04/25/18 1800      PT LONG TERM GOAL #1   Title  Patient verbalizes & demonstrates proper prosthetic care to enable safe use of prosthesis. (All LTGs Target Date: 07/21/2018)    Time  3    Period  Months    Status  New    Target Date  07/21/18      PT LONG TERM GOAL #2    Title  Patient tolerates prosthesis wear >90% of awake hour without skin issues or limb pain >2/10 to enable function throughout his day.     Time  3    Period  Months    Status  New    Target Date  07/21/18      PT LONG TERM GOAL #3   Title  Berg Balance >45/56 to indicate lower fall risk.     Time  3    Period  Months    Status  New    Target Date  07/21/18      PT LONG TERM GOAL #4   Title  Patient ambulates 300' with LRAD & prosthesis modified independent for community mobility.     Time  3    Period  Months    Status  New    Target Date  07/21/18      PT LONG TERM GOAL #5   Title  Patient negotiates ramps, curbs & stairs with LRAD & prosthesis modified independent for community access.     Time  3    Period  Months    Status  New    Target Date  07/21/18      Additional Long Term Goals   Additional Long Term Goals  Yes      PT LONG TERM GOAL #6   Title  Patient ambulates around furniture with cane or less & prosthesis carrying household items modified independent.    Time  3    Period  Months    Status  New    Target Date  07/21/18             Plan - 04/25/18 1800    Clinical Impression Statement  This 57yo male underwent a left Transtibial Amputation on 01/25/2018 and received his first prosthesis on 04/21/2018. He is dependent in prosthetic care and has three wounds on limb which increases risk of issues with prosthesis use. He has worn prosthesis 4 times for 10 minutes only since delivery 5 days ago. Functioning without prosthesis or limited wear increases fall risk, energy expenditure & stress on other body areas. He has fallen 4 times with bruises and lacerations. He has pain in residual limb which increases with prosthesis weight bearing. He has impaired balance with Merrilee Jansky Balance 24/56 which indicates high fall risk & dependency in standing ADLs. His gait is impaired requiring rolling walker support with limited weight bearing on prosthesis, pain in limb, gait  deviations and gait velocity of 1.45 ft/sec indicates high fall risk & limited mobility. Patient would benefit from skilled Physical Therapy to improve function & mobility with prosthesis.     History and Personal Factors relevant to plan of care:  LTTA, R great toe amputation, DM2, neuropathy, COPD, HTN, HLD, CHF    Clinical Presentation  Evolving    Clinical Presentation due to:  high fall risk, DM2 with wounds on residual limb, neuropathy, dependency in prosthetic care,     Clinical Decision Making  Moderate    Rehab Potential  Good    PT Frequency  2x / week    PT Duration  Other (comment)   13 weeks (90 days)   PT Treatment/Interventions  ADLs/Self Care Home Management;[place]ith Repostioning;Moist Heat;Ultrasound;DME Instruction;Gait training;Stair training;Functional mobility training;Therapeutic activities;Therapeutic exercise;Balance training;Neuromuscular re-education;Patient/family education;Prosthetic Training;Manual techniques;Vestibular;Dry needling    PT Next Visit Plan  HEP at sink for midline, review prosthetic care, instruct in prosthetic gait with RW including ramps & curbs, stairs 2 rails.     Consulted and Agree with Plan of Care  Patient;Family member/caregiver    Family Member Consulted  wife       Patient will benefit from skilled therapeutic intervention in  order to improve the following deficits and impairments:  Abnormal gait, Decreased activity tolerance, Decreased balance, Cardiopulmonary status limiting activity, Decreased endurance, Decreased mobility, Decreased strength, Dizziness, Impaired flexibility, Postural dysfunction, Prosthetic Dependency, Pain  Visit Diagnosis: Muscle weakness (generalized)  Other abnormalities of gait and mobility  Unsteadiness on feet  Repeated falls  Abnormal posture     Problem List Patient Active Problem List   Diagnosis Date Noted  . Below knee amputation status, left 01/25/2018  . Wound dehiscence, surgical   . Foot  amputation status, left 01/03/2018  . Status post left foot surgery 12/15/2017  . Snoring 06/07/2016  . Chest pain 01/22/2016  . Abnormal nuclear stress test 01/22/2016  . COPD (chronic obstructive pulmonary disease) ([place]) 01/27/2011  . Pre-operative cardiovascular examination 01/27/2011  . Nonspecific abnormal electrocardiogram (ECG) (EKG) 01/27/2011  . Murmur 01/27/2011  . DM2 (diabetes mellitus, type 2) ([place])   . HTN (hypertension)   . HLD (hyperlipidemia)   . Tobacco abuse     Nyana Haren  PT, DPT 04/26/2018, 6:47 AM  [facility] [address] [place], [place], [address] Phone: [phone]   Fax:  [phone]  Name: Troy [name] MRN: [id number] Date of Birth: 30-Jan-1961

## 2018-04-28 ENCOUNTER — Ambulatory Visit: Payer: Medicare HMO

## 2018-04-28 DIAGNOSIS — M6281 Muscle weakness (generalized): Secondary | ICD-10-CM

## 2018-04-28 DIAGNOSIS — R293 Abnormal posture: Secondary | ICD-10-CM

## 2018-04-28 DIAGNOSIS — R2689 Other abnormalities of gait and mobility: Secondary | ICD-10-CM

## 2018-04-28 DIAGNOSIS — R2681 Unsteadiness on feet: Secondary | ICD-10-CM

## 2018-04-28 DIAGNOSIS — R296 Repeated falls: Secondary | ICD-10-CM

## 2018-04-28 NOTE — Therapy (Signed)
[facility] [address] [place], [place], [address] Phone: [phone]   Fax:  [phone]  Physical Therapy Treatment  Patient Details  Name: Troy [name] MRN: [id number] Date of Birth: July 18, 1960 Referring Provider (PT): Erlinda Hong, [place]   Encounter Date: 04/28/2018  PT End of Session - 04/28/18 0939    Visit Number  2    Number of Visits  26    Date for PT Re-Evaluation  07/21/18    Authorization Type  AETNA Medicare    Authorization Time Period  $40 co-pay,  $4200oop, $2363.66 remaining prior to PT eval    PT Start Time  0936    PT Stop Time  1016    PT Time Calculation (min)  40 min    Equipment Utilized During Treatment  Gait belt    Activity Tolerance  Patient tolerated treatment well;Patient limited by pain    Behavior During Therapy  [facility] for tasks assessed/performed       Past Medical History:  Diagnosis Date  . Acute renal failure ([place])    in setting of NSAID use and orthopedic surgery 2010  . Anxiety and depression   . Chronic diastolic CHF (congestive heart failure) ([place])    a. Echo 6/17: severe conc LVH, vigorous EF, EF 65-70%, no dynamic obstruction, no RWMA, Gr 1 DD, mild TR  //  b. LHC 8/17: no sig CAD, LVEDP 28  . COPD (chronic obstructive pulmonary disease) ([place])   . Diabetic ulcer of left foot ([place])   . DM2 (diabetes mellitus, type 2) ([place])   . Family history of early CAD   . Fatty liver   . GERD (gastroesophageal reflux disease)   . History of amputation of foot ([place])    L trans-met // R toe  . History of cardiac catheterization    a. [place] 2002: irregs  //  b. LHC in 8/17: no sig CAD, apical DK, hyperdynamic LV, LVEDP 28  . History of kidney stones   . History of nuclear stress test    a. Nuc 7/17: Overall, intermediate risk nuclear stress test secondary to small size of apical lateral defect and reduced ejection fraction.  EF 43%  . HLD (hyperlipidemia)   . HTN (hypertension)   .  Injuries     crushing injury to both his feet in February 2010.   Marland Kitchen Kidney calculi   . Palpitations   . Tobacco abuse     Past Surgical History:  Procedure Laterality Date  . AMPUTATION Left 01/03/2018   Procedure: LEFT MIDFOOT AMPUTATION/REVISION MIDAMPUTAION;  Surgeon: Mcarthur Rossetti, MD;  Location: [facility];  Service: Orthopedics;  Laterality: Left;  . AMPUTATION Left 01/25/2018   Procedure: LEFT BELOW KNEE AMPUTATION;  Surgeon: [name], MD;  Location: [facility];  Service: Orthopedics;  Laterality: Left;  . BELOW KNEE LEG AMPUTATION Left 01/25/2018  . CARDIAC CATHETERIZATION N/A 01/22/2016   Procedure: Left Heart Cath and Coronary Angiography;  Surgeon: Peter M Martinique, MD;  Location: [facility];  Service: Cardiovascular;  Laterality: N/A;  . FOOT AMPUTATION Bilateral   . I&D EXTREMITY Left 12/15/2017   Procedure: IRRIGATION AND DEBRIDEMENT LEFT FOOT ULCER;  Surgeon: Mcarthur Rossetti, MD;  Location: [facility];  Service: Orthopedics;  Laterality: Left;  . LITHOTRIPSY    . TENDON LENGTHENING Bilateral    calf  . TONSILLECTOMY      There were no vitals filed for this visit.  Subjective Assessment - 04/28/18  0939    Subjective  Pt fell this am going down concrete ramp at home, hit only his knee, has a scrape on R knee with bandage, pt did not hit his head. Pt has not worn liner/prosthesis in 2 days.     Patient is accompained by:  Family member   Wife   Pertinent History  LTTA, DM2, neuropathy, COPD, HTN, HLD, CHF,    Limitations  Lifting;Standing;Walking;House hold activities    Patient Stated Goals  To use prosthesis to walk in community, fishing, go on vacations     Currently in Pain?  No/denies        [facility] Adult PT Treatment/Exercise - 04/28/18 1010      Ambulation/Gait   Ambulation/Gait  Yes    Ambulation/Gait Assistance  4: Min guard    Ambulation/Gait Assistance Details  Excessive UE reliance on RW, max VC's for pacing during ambulation.     Ambulation  Distance (Feet)  115 Feet   100,100   Assistive device  Rolling walker;Prosthesis    Gait Pattern  Step-to pattern;Decreased step length - right;Decreased stance time - left;Decreased stride length;Decreased hip/knee flexion - left;Decreased weight shift to left;Left hip hike;Antalgic;Trunk flexed;Abducted - left;Poor foot clearance - left    Ambulation Surface  Level;Indoor      Prosthetics   Prosthetic Care Comments   Pt stated that he has now worn liner/prosthesis for the last few days. Pt able to donn/doff prosthesis without issues.     Education Provided  Proper Donning;Proper Doffing;Proper wear schedule/adjustment;Proper weight-bearing schedule/adjustment    Person(s) Educated  Patient;Spouse    Education Method  Explanation    Education Method  Verbalized understanding;Needs further [place] Prosthesis  Supervision    Doffing Prosthesis  Supervision        PT Education - 04/28/18 1008    Education Details  Sink HEP, educated pt on importance and benefits of continued prosthetic wear.    Person(s) Educated  Patient;Spouse    Methods  Explanation;Demonstration;Tactile cues;Verbal cues;Handout    Comprehension  Verbalized understanding;Returned demonstration;Verbal cues required;Tactile cues required;Need further instruction       PT Short Term Goals - 04/25/18 1800      PT SHORT TERM GOAL #1   Title  Patient verbalizes proper cleaning & demonstrates donning of prosthesis. (All STGs Target Date: 05/25/2018)    Time  1    Period  Months    Status  New    Target Date  05/25/18      PT SHORT TERM GOAL #2   Title  Patient tolerates wear >8 hrs total /day without increased skin issues or limb pain >6/10.     Time  1    Period  Months    Status  New    Target Date  05/25/18      PT SHORT TERM GOAL #3   Title  Patient standing balance without UE support reaching 5", scanning environment and picking up objects from floor with supervision.     Time  1    Period   Months    Status  New    Target Date  05/25/18      PT SHORT TERM GOAL #4   Title  Patient ambulates 300' with RW & prosthesis with supervision.     Time  1    Period  Months    Status  New    Target Date  05/25/18      PT SHORT TERM GOAL #  5   Title  Patient negotiates ramps, curbs with RW & stairs with 1 rail & cane with supervision.     Time  1    Period  Months    Status  New    Target Date  05/25/18        PT Long Term Goals - 04/25/18 1800      PT LONG TERM GOAL #1   Title  Patient verbalizes & demonstrates proper prosthetic care to enable safe use of prosthesis. (All LTGs Target Date: 07/21/2018)    Time  3    Period  Months    Status  New    Target Date  07/21/18      PT LONG TERM GOAL #2   Title  Patient tolerates prosthesis wear >90% of awake hour without skin issues or limb pain >2/10 to enable function throughout his day.     Time  3    Period  Months    Status  New    Target Date  07/21/18      PT LONG TERM GOAL #3   Title  Berg Balance >45/56 to indicate lower fall risk.     Time  3    Period  Months    Status  New    Target Date  07/21/18      PT LONG TERM GOAL #4   Title  Patient ambulates 300' with LRAD & prosthesis modified independent for community mobility.     Time  3    Period  Months    Status  New    Target Date  07/21/18      PT LONG TERM GOAL #5   Title  Patient negotiates ramps, curbs & stairs with LRAD & prosthesis modified independent for community access.     Time  3    Period  Months    Status  New    Target Date  07/21/18      Additional Long Term Goals   Additional Long Term Goals  Yes      PT LONG TERM GOAL #6   Title  Patient ambulates around furniture with cane or less & prosthesis carrying household items modified independent.    Time  3    Period  Months    Status  New    Target Date  07/21/18            Plan - 04/28/18 1302    Clinical Impression Statement  Todays skilled session focused on initiating  sink HEP, education on continued prosthetic wear and prosthetic gait with RW, pt presented with excessive BUE reliance required constant cues for pacing. Pt should benefit from continued PT sessions to progress towards goals.     Rehab Potential  Good    PT Frequency  2x / week    PT Duration  Other (comment)   13 weeks (90 days)   PT Treatment/Interventions  ADLs/Self Care Home Management;Canalith Repostioning;Moist Heat;Ultrasound;DME Instruction;Gait training;Stair training;Functional mobility training;Therapeutic activities;Therapeutic exercise;Balance training;Neuromuscular re-education;Patient/family education;Prosthetic Training;Manual techniques;Vestibular;Dry needling    PT Next Visit Plan  Review prosthetic care, instruct in prosthetic gait with RW including ramps & curbs, stairs 2 rails.     Consulted and Agree with Plan of Care  Patient;Family member/caregiver    Family Member Consulted  wife       Patient will benefit from skilled therapeutic intervention in order to improve the following deficits and impairments:  Abnormal gait, Decreased activity tolerance, Decreased balance, Cardiopulmonary status  limiting activity, Decreased endurance, Decreased mobility, Decreased strength, Dizziness, Impaired flexibility, Postural dysfunction, Prosthetic Dependency, Pain  Visit Diagnosis: Muscle weakness (generalized)  Other abnormalities of gait and mobility  Unsteadiness on feet  Repeated falls  Abnormal posture     Problem List Patient Active Problem List   Diagnosis Date Noted  . Below knee amputation status, left 01/25/2018  . Wound dehiscence, surgical   . Foot amputation status, left 01/03/2018  . Status post left foot surgery 12/15/2017  . Snoring 06/07/2016  . Chest pain 01/22/2016  . Abnormal nuclear stress test 01/22/2016  . COPD (chronic obstructive pulmonary disease) ([place]) 01/27/2011  . Pre-operative cardiovascular examination 01/27/2011  . Nonspecific abnormal  electrocardiogram (ECG) (EKG) 01/27/2011  . Murmur 01/27/2011  . DM2 (diabetes mellitus, type 2) ([place])   . HTN (hypertension)   . HLD (hyperlipidemia)   . Tobacco abuse    Troy [name], PTA  Troy [name] 04/28/2018, 1:07 PM  [facility] [address] [place], [place], [address] Phone: [phone]   Fax:  [phone]  Name: Troy [name] MRN: [id number] Date of Birth: 1961/05/12

## 2018-04-28 NOTE — Patient Instructions (Signed)
Do each exercise 3  times per day Do each exercise 10 repetitions Hold each exercise for 5 seconds to feel your location  AT SINK FIND YOUR MIDLINE POSITION AND PLACE FEET EQUAL DISTANCE FROM THE MIDLINE.  USE TAPE ON FLOOR TO MARK THE MIDLINE POSITION. You also should try to feel with your limb pressure in socket.  You are trying to feel with limb what you used to feel with the bottom of your foot.  1. Side to Side Shift: Moving your hips only (not shoulders): move weight onto your left leg, HOLD/FEEL.  Move back to equal weight on each leg, HOLD/FEEL. Move weight onto your right leg, HOLD/FEEL. Move back to equal weight on each leg, HOLD/FEEL. Repeat. 2. Front to Back Shift: Moving your hips only (not shoulders): move your weight forward onto your toes, HOLD/FEEL. Move your weight back to equal Flat Foot on both legs, HOLD/FEEL. Move your weight back onto your heels, HOLD/FEEL. Move your weight back to equal on both legs, HOLD/FEEL. Repeat. 3. Moving Cones / Cups: With equal weight on each leg: Hold on with one hand the first time, then progress to no hand supports. Move cups from one side of sink to the other. Place cups ~2" out of your reach, progress to 10" beyond reach. 4. Overhead/Upward Reaching: alternated reaching up to top cabinets or ceiling if no cabinets present. Keep equal weight on each leg. Start with one hand support on counter while other hand reaches and progress to no hand support with reaching. 5.   Looking Over Shoulders: With equal weight on each leg: alternate turning to look          over your shoulders with one hand support on counter as needed. Shift weight to             side looking, pull hip then shoulder then head/eyes around to look behind you. Start       with one hand support & progress to no hand support. 

## 2018-05-03 ENCOUNTER — Ambulatory Visit: Payer: Medicare HMO | Admitting: Physical Therapy

## 2018-05-03 ENCOUNTER — Encounter: Payer: Self-pay | Admitting: Physical Therapy

## 2018-05-03 DIAGNOSIS — M6281 Muscle weakness (generalized): Secondary | ICD-10-CM | POA: Diagnosis not present

## 2018-05-03 DIAGNOSIS — R293 Abnormal posture: Secondary | ICD-10-CM

## 2018-05-03 DIAGNOSIS — R2681 Unsteadiness on feet: Secondary | ICD-10-CM

## 2018-05-03 DIAGNOSIS — R2689 Other abnormalities of gait and mobility: Secondary | ICD-10-CM

## 2018-05-03 NOTE — Therapy (Signed)
[facility] [address] [place], [place], [address] Phone: [phone]   Fax:  [phone]  Physical Therapy Treatment  Patient Details  Name: Troy [name] MRN: [id number] Date of Birth: [birth year] Referring Provider (PT): Erlinda Hong, [place]   Encounter Date: 05/03/2018  PT End of Session - 05/03/18 1109    Visit Number  3    Number of Visits  26    Date for PT Re-Evaluation  07/21/18    Authorization Type  AETNA Medicare    Authorization Time Period  $40 co-pay,  $4200oop, $2363.66 remaining prior to PT eval    PT Start Time  1017    PT Stop Time  1100    PT Time Calculation (min)  43 min    Equipment Utilized During Treatment  Gait belt    Activity Tolerance  Patient tolerated treatment well;Patient limited by pain    Behavior During Therapy  [facility] for tasks assessed/performed       Past Medical History:  Diagnosis Date  . Acute renal failure ([place])    in setting of NSAID use and orthopedic surgery 2010  . Anxiety and depression   . Chronic diastolic CHF (congestive heart failure) ([place])    a. Echo 6/17: severe conc LVH, vigorous EF, EF 65-70%, no dynamic obstruction, no RWMA, Gr 1 DD, mild TR  //  b. LHC 8/17: no sig CAD, LVEDP 28  . COPD (chronic obstructive pulmonary disease) ([place])   . Diabetic ulcer of left foot ([place])   . DM2 (diabetes mellitus, type 2) ([place])   . Family history of early CAD   . Fatty liver   . GERD (gastroesophageal reflux disease)   . History of amputation of foot ([place])    L trans-met // R toe  . History of cardiac catheterization    a. [place] 2002: irregs  //  b. LHC in 8/17: no sig CAD, apical DK, hyperdynamic LV, LVEDP 28  . History of kidney stones   . History of nuclear stress test    a. Nuc 7/17: Overall, intermediate risk nuclear stress test secondary to small size of apical lateral defect and reduced ejection fraction.  EF 43%  . HLD (hyperlipidemia)   . HTN (hypertension)   .  Injuries     crushing injury to both his feet in February 2010.   Marland Kitchen Kidney calculi   . Palpitations   . Tobacco abuse     Past Surgical History:  Procedure Laterality Date  . AMPUTATION Left 01/03/2018   Procedure: LEFT MIDFOOT AMPUTATION/REVISION MIDAMPUTAION;  Surgeon: Mcarthur Rossetti, MD;  Location: [facility];  Service: Orthopedics;  Laterality: Left;  . AMPUTATION Left 01/25/2018   Procedure: LEFT BELOW KNEE AMPUTATION;  Surgeon: [name], MD;  Location: [facility];  Service: Orthopedics;  Laterality: Left;  . BELOW KNEE LEG AMPUTATION Left 01/25/2018  . CARDIAC CATHETERIZATION N/A 01/22/2016   Procedure: Left Heart Cath and Coronary Angiography;  Surgeon: Peter M Martinique, MD;  Location: [facility];  Service: Cardiovascular;  Laterality: N/A;  . FOOT AMPUTATION Bilateral   . I&D EXTREMITY Left 12/15/2017   Procedure: IRRIGATION AND DEBRIDEMENT LEFT FOOT ULCER;  Surgeon: Mcarthur Rossetti, MD;  [facility];  Service: Orthopedics;  Laterality: Left;  . LITHOTRIPSY    . TENDON LENGTHENING Bilateral    calf  . TONSILLECTOMY      There were no vitals filed for this visit.  Subjective Assessment - 05/03/18  1020    Subjective  No falls or complaints to report. Pt states he has been walking around his house with cane and no AD sometimes.     Patient is accompained by:  Family member   Wife   Pertinent History  LTTA, DM2, neuropathy, COPD, HTN, HLD, CHF,    Limitations  Lifting;Standing;Walking;House hold activities    Patient Stated Goals  To use prosthesis to walk in community, fishing, go on vacations     Currently in Pain?  Yes    Pain Score  8     Pain Location  Leg    Pain Orientation  Right    Pain Type  Phantom pain    Pain Frequency  Constant    Aggravating Factors   pressur on limb     Pain Relieving Factors  take prosthesis off     Multiple Pain Sites  Yes    Pain Score  7    Pain Location  Foot    Pain Orientation  Right    Pain Type  Phantom pain     Pain Onset  More than a month ago    Pain Frequency  Constant            [facility] Adult PT Treatment/Exercise - 05/03/18 1024      Ambulation/Gait   Ambulation/Gait  Yes    Ambulation/Gait Assistance  5: Supervision    Ambulation/Gait Assistance Details  Verbal cues for upright posture and to lighten UE weight bearing on RW.     Ambulation Distance (Feet)  345 Feet    Assistive device  Rolling walker    Gait Pattern  Step-through pattern;Decreased step length - right;Decreased stance time - left;Decreased stride length;Decreased weight shift to left;Trunk flexed    Ambulation Surface  Level;Indoor      High Level Balance   High Level Balance Activities  Backward walking;Side stepping    High Level Balance Comments  In parallel bars: foward and backward walking (attempted foward marchning however pt experienced increased pain); side stepping; staggered stance weight shifting; rocker board ant-post weight shift and lateral weight shift. Pt required multiple rest breaks and min A to maintain balance. verbal and tactile cues for upright posture, verbal cues to slow pace for safety, and demo needed for exercise technique.       Prosthetics   Prosthetic Care Comments   Stated has been wearing 2hrs 2x/day. Recommended wearing 3hrs 2x a day.  Also recommended applying another 1 ply sock to reduce pressure on residual limb.     Current prosthetic weight-bearing tolerance (hours/day)   Pt reports 8/10 pain with weight bearing on prosthesis which increased throughout session.     Residual limb condition   intact with Tegaderm cover. Redness noted at knee.     Education Provided  Residual limb care;Skin check;Correct ply sock adjustment;Proper wear schedule/adjustment    Person(s) Educated  Patient;Spouse    Education Method  Explanation    Education Method  Verbalized understanding    Donning Prosthesis  Supervision    Doffing Prosthesis  Supervision             PT Education -  05/03/18 1122    Education Details  Recommened to keep using Tegaderm as pt was given more.        PT Short Term Goals - 04/25/18 1800      PT SHORT TERM GOAL #1   Title  Patient verbalizes proper cleaning & demonstrates donning of prosthesis. (All  STGs Target Date: 05/25/2018)    Time  1    Period  Months    Status  New    Target Date  05/25/18      PT SHORT TERM GOAL #2   Title  Patient tolerates wear >8 hrs total /day without increased skin issues or limb pain >6/10.     Time  1    Period  Months    Status  New    Target Date  05/25/18      PT SHORT TERM GOAL #3   Title  Patient standing balance without UE support reaching 5", scanning environment and picking up objects from floor with supervision.     Time  1    Period  Months    Status  New    Target Date  05/25/18      PT SHORT TERM GOAL #4   Title  Patient ambulates 300' with RW & prosthesis with supervision.     Time  1    Period  Months    Status  New    Target Date  05/25/18      PT SHORT TERM GOAL #5   Title  Patient negotiates ramps, curbs with RW & stairs with 1 rail & cane with supervision.     Time  1    Period  Months    Status  New    Target Date  05/25/18        PT Long Term Goals - 04/25/18 1800      PT LONG TERM GOAL #1   Title  Patient verbalizes & demonstrates proper prosthetic care to enable safe use of prosthesis. (All LTGs Target Date: 07/21/2018)    Time  3    Period  Months    Status  New    Target Date  07/21/18      PT LONG TERM GOAL #2   Title  Patient tolerates prosthesis wear >90% of awake hour without skin issues or limb pain >2/10 to enable function throughout his day.     Time  3    Period  Months    Status  New    Target Date  07/21/18      PT LONG TERM GOAL #3   Title  Berg Balance >45/56 to indicate lower fall risk.     Time  3    Period  Months    Status  New    Target Date  07/21/18      PT LONG TERM GOAL #4   Title  Patient ambulates 300' with LRAD &  prosthesis modified independent for community mobility.     Time  3    Period  Months    Status  New    Target Date  07/21/18      PT LONG TERM GOAL #5   Title  Patient negotiates ramps, curbs & stairs with LRAD & prosthesis modified independent for community access.     Time  3    Period  Months    Status  New    Target Date  07/21/18      Additional Long Term Goals   Additional Long Term Goals  Yes      PT LONG TERM GOAL #6   Title  Patient ambulates around furniture with cane or less & prosthesis carrying household items modified independent.    Time  3    Period  Months    Status  New  Target Date  07/21/18            Plan - 05/03/18 1110    Clinical Impression Statement  Today's sessin focused on balance exercises and gait training emphasizing increased weight bearing and weight accceptance onto prosthesis. Pt continues to rely heavily on UE support on RW during ambulation. Pt requires supervision with gait and min A with balance exercises. Verbal and tactile cues for upright posture and increased weight bearing on prosthesis. Pt was limited by pain due to increased pressure on prosthesis throughout session. Redness noted over pt's right knee.  Pt would benefit from futher PT to improve mobility and independence with prosthesis.     Rehab Potential  Good    PT Frequency  2x / week    PT Duration  Other (comment)   13 weeks (90 days)   PT Treatment/Interventions  ADLs/Self Care Home Management;Canalith Repostioning;Moist Heat;Ultrasound;DME Instruction;Gait training;Stair training;Functional mobility training;Therapeutic activities;Therapeutic exercise;Balance training;Neuromuscular re-education;Patient/family education;Prosthetic Training;Manual techniques;Vestibular;Dry needling    PT Next Visit Plan  Review prosthetic care, instruct in prosthetic gait with RW including ramps & curbs, stairs 2 rails.     Consulted and Agree with Plan of Care  Patient;Family  member/caregiver    Family Member Consulted  wife       Patient will benefit from skilled therapeutic intervention in order to improve the following deficits and impairments:  Abnormal gait, Decreased activity tolerance, Decreased balance, Cardiopulmonary status limiting activity, Decreased endurance, Decreased mobility, Decreased strength, Dizziness, Impaired flexibility, Postural dysfunction, Prosthetic Dependency, Pain  Visit Diagnosis: Muscle weakness (generalized)  Other abnormalities of gait and mobility  Unsteadiness on feet  Abnormal posture     Problem List Patient Active Problem List   Diagnosis Date Noted  . Below knee amputation status, left 01/25/2018  . Wound dehiscence, surgical   . Foot amputation status, left 01/03/2018  . Status post left foot surgery 12/15/2017  . Snoring 06/07/2016  . Chest pain 01/22/2016  . Abnormal nuclear stress test 01/22/2016  . COPD (chronic obstructive pulmonary disease) ([place]) 01/27/2011  . Pre-operative cardiovascular examination 01/27/2011  . Nonspecific abnormal electrocardiogram (ECG) (EKG) 01/27/2011  . Murmur 01/27/2011  . DM2 (diabetes mellitus, type 2) ([place])   . HTN (hypertension)   . HLD (hyperlipidemia)   . Tobacco abuse     Cecile Sheerer, [place] 05/03/2018, 11:25 AM  [facility] [address] [place], [place], [address] Phone: [phone]   Fax:  [phone]  Name: Troy [name] MRN: [id number] Date of Birth: 1960/10/08

## 2018-05-05 ENCOUNTER — Ambulatory Visit: Payer: Medicare HMO

## 2018-05-05 DIAGNOSIS — M6281 Muscle weakness (generalized): Secondary | ICD-10-CM

## 2018-05-05 DIAGNOSIS — R293 Abnormal posture: Secondary | ICD-10-CM

## 2018-05-05 DIAGNOSIS — R2681 Unsteadiness on feet: Secondary | ICD-10-CM

## 2018-05-05 DIAGNOSIS — R2689 Other abnormalities of gait and mobility: Secondary | ICD-10-CM

## 2018-05-05 NOTE — Therapy (Signed)
[facility] [address] [place], [place], [address] Phone: [phone]   Fax:  [phone]  Physical Therapy Treatment  Patient Details  Name: Troy [name] MRN: [id number] Date of Birth: Sep 27, 1960 Referring Provider (PT): Erlinda Hong, [place]   Encounter Date: 05/05/2018  PT End of Session - 05/05/18 1052    Visit Number  4    Number of Visits  26    Date for PT Re-Evaluation  07/21/18    Authorization Type  AETNA Medicare    Authorization Time Period  $40 co-pay,  $4200oop, $2363.66 remaining prior to PT eval    PT Start Time  1045    PT Stop Time  1130    PT Time Calculation (min)  45 min    Equipment Utilized During Treatment  Gait belt    Activity Tolerance  Patient tolerated treatment well;Patient limited by pain    Behavior During Therapy  [facility] for tasks assessed/performed       Past Medical History:  Diagnosis Date  . Acute renal failure ([place])    in setting of NSAID use and orthopedic surgery 2010  . Anxiety and depression   . Chronic diastolic CHF (congestive heart failure) ([place])    a. Echo 6/17: severe conc LVH, vigorous EF, EF 65-70%, no dynamic obstruction, no RWMA, Gr 1 DD, mild TR  //  b. LHC 8/17: no sig CAD, LVEDP 28  . COPD (chronic obstructive pulmonary disease) ([place])   . Diabetic ulcer of left foot ([place])   . DM2 (diabetes mellitus, type 2) ([place])   . Family history of early CAD   . Fatty liver   . GERD (gastroesophageal reflux disease)   . History of amputation of foot ([place])    L trans-met // R toe  . History of cardiac catheterization    a. [place] 2002: irregs  //  b. LHC in 8/17: no sig CAD, apical DK, hyperdynamic LV, LVEDP 28  . History of kidney stones   . History of nuclear stress test    a. Nuc 7/17: Overall, intermediate risk nuclear stress test secondary to small size of apical lateral defect and reduced ejection fraction.  EF 43%  . HLD (hyperlipidemia)   . HTN (hypertension)   .  Injuries     crushing injury to both his feet in February 2010.   Marland Kitchen Kidney calculi   . Palpitations   . Tobacco abuse     Past Surgical History:  Procedure Laterality Date  . AMPUTATION Left 01/03/2018   Procedure: LEFT MIDFOOT AMPUTATION/REVISION MIDAMPUTAION;  Surgeon: Mcarthur Rossetti, MD;  Location: [facility];  Service: Orthopedics;  Laterality: Left;  . AMPUTATION Left 01/25/2018   Procedure: LEFT BELOW KNEE AMPUTATION;  Surgeon: [name], MD;  Location: [facility];  Service: Orthopedics;  Laterality: Left;  . BELOW KNEE LEG AMPUTATION Left 01/25/2018  . CARDIAC CATHETERIZATION N/A 01/22/2016   Procedure: Left Heart Cath and Coronary Angiography;  Surgeon: Peter M Martinique, MD;  Location: [facility];  Service: Cardiovascular;  Laterality: N/A;  . FOOT AMPUTATION Bilateral   . I&D EXTREMITY Left 12/15/2017   Procedure: IRRIGATION AND DEBRIDEMENT LEFT FOOT ULCER;  Surgeon: Mcarthur Rossetti, MD;  [facility];  Service: Orthopedics;  Laterality: Left;  . LITHOTRIPSY    . TENDON LENGTHENING Bilateral    calf  . TONSILLECTOMY      There were no vitals filed for this visit.  Subjective Assessment - 05/05/18  1049    Subjective  No falls or compliants, HEP is going well.     Patient is accompained by:  Family member    Pertinent History  LTTA, DM2, neuropathy, COPD, HTN, HLD, CHF,    Limitations  Lifting;Standing;Walking;House hold activities    Patient Stated Goals  To use prosthesis to walk in community, fishing, go on vacations     Currently in Pain?  Yes    Pain Score  8     Pain Location  Foot   R foot   Pain Orientation  Right;Left    Pain Descriptors / Indicators  Burning;Sharp    Pain Frequency  Constant    Aggravating Factors   extended walking    Pain Relieving Factors  Pain medication         [facility] Adult PT Treatment/Exercise - 05/05/18 1052      Transfers   Transfers  Sit to Stand;Stand to Sit    Sit to Stand  6: Modified independent  (Device/Increase time)    Stand to Sit  6: Modified independent (Device/Increase time)    Number of Reps  10 reps;2 sets;Other sets (comment)   UE support vs no UE support     Ambulation/Gait   Ambulation/Gait  Yes    Ambulation/Gait Assistance  4: Min guard;4: Min assist;5: Supervision    Ambulation/Gait Assistance Details  44' with RW Supervision/min guard; 115 with SBQC min guard/min assist. VC's for pacing, weight shift and step length.     Ambulation Distance (Feet)  230 Feet   115   Assistive device  Rolling walker;Small based quad cane;Prosthesis    Gait Pattern  Step-through pattern;Decreased step length - right;Decreased stance time - left;Decreased stride length;Decreased weight shift to left;Trunk flexed    Ambulation Surface  Level;Indoor    Stairs  Yes    Stairs Assistance  5: Supervision    Stair Management Technique  Two rails;Alternating pattern;Step to pattern;Forwards;Other (comment)   prosthesis   Number of Stairs  4    Height of Stairs  6    Ramp  Other (comment)   Min guard   Ramp Details (indicate cue type and reason)  1 episode of LOB descending ramp with pt able to correct. VC's for pacing, and weight shift.     Curb  4: Min assist;Other (comment)   Min guard   Curb Details (indicate cue type and reason)  VC's for proper sequence while ascending/descending, proper foot and RW placement.       Neuro Re-ed    Neuro Re-ed Details  In parallel bars standing on airex while performing head movements with EO with moderate sway and strong posterior lean with tactile feed back for correction.      Pt in parallel bars standing on airex while reaching up/across/down, outside BOS while picking up objects of various heights/sizes with no LOB or UE support required.       Prosthetics   Current prosthetic wear tolerance (days/week)   3 hrs/3x day, everyday    Current prosthetic weight-bearing tolerance (hours/day)   Pain with anterior knee in prosthesis, suggested asking  for added padding at next prosthesis appt.          PT Short Term Goals - 04/25/18 1800      PT SHORT TERM GOAL #1   Title  Patient verbalizes proper cleaning & demonstrates donning of prosthesis. (All STGs Target Date: 05/25/2018)    Time  1    Period  Months  Status  New    Target Date  05/25/18      PT SHORT TERM GOAL #2   Title  Patient tolerates wear >8 hrs total /day without increased skin issues or limb pain >6/10.     Time  1    Period  Months    Status  New    Target Date  05/25/18      PT SHORT TERM GOAL #3   Title  Patient standing balance without UE support reaching 5", scanning environment and picking up objects from floor with supervision.     Time  1    Period  Months    Status  New    Target Date  05/25/18      PT SHORT TERM GOAL #4   Title  Patient ambulates 300' with RW & prosthesis with supervision.     Time  1    Period  Months    Status  New    Target Date  05/25/18      PT SHORT TERM GOAL #5   Title  Patient negotiates ramps, curbs with RW & stairs with 1 rail & cane with supervision.     Time  1    Period  Months    Status  New    Target Date  05/25/18        PT Long Term Goals - 04/25/18 1800      PT LONG TERM GOAL #1   Title  Patient verbalizes & demonstrates proper prosthetic care to enable safe use of prosthesis. (All LTGs Target Date: 07/21/2018)    Time  3    Period  Months    Status  New    Target Date  07/21/18      PT LONG TERM GOAL #2   Title  Patient tolerates prosthesis wear >90% of awake hour without skin issues or limb pain >2/10 to enable function throughout his day.     Time  3    Period  Months    Status  New    Target Date  07/21/18      PT LONG TERM GOAL #3   Title  Berg Balance >45/56 to indicate lower fall risk.     Time  3    Period  Months    Status  New    Target Date  07/21/18      PT LONG TERM GOAL #4   Title  Patient ambulates 300' with LRAD & prosthesis modified independent for community mobility.      Time  3    Period  Months    Status  New    Target Date  07/21/18      PT LONG TERM GOAL #5   Title  Patient negotiates ramps, curbs & stairs with LRAD & prosthesis modified independent for community access.     Time  3    Period  Months    Status  New    Target Date  07/21/18      Additional Long Term Goals   Additional Long Term Goals  Yes      PT LONG TERM GOAL #6   Title  Patient ambulates around furniture with cane or less & prosthesis carrying household items modified independent.    Time  3    Period  Months    Status  New    Target Date  07/21/18            Plan - 05/05/18  1142    Clinical Impression Statement  Todays skilled session focused on prosthetic gait training with RW/SBQC, high level balance on airex and STS activity with no UE support required. Pt is making progress and should benefit from PT sessions to continue to progress towards unmet goals.     Rehab Potential  Good    PT Frequency  2x / week    PT Duration  Other (comment)   13 weeks (90 days)   PT Treatment/Interventions  ADLs/Self Care Home Management;Canalith Repostioning;Moist Heat;Ultrasound;DME Instruction;Gait training;Stair training;Functional mobility training;Therapeutic activities;Therapeutic exercise;Balance training;Neuromuscular re-education;Patient/family education;Prosthetic Training;Manual techniques;Vestibular;Dry needling    PT Next Visit Plan  Review prosthetic care, instruct in prosthetic gait with SBQC, high level balance on compliant surfaces.     Consulted and Agree with Plan of Care  Patient;Family member/caregiver    Family Member Consulted  wife       Patient will benefit from skilled therapeutic intervention in order to improve the following deficits and impairments:  Abnormal gait, Decreased activity tolerance, Decreased balance, Cardiopulmonary status limiting activity, Decreased endurance, Decreased mobility, Decreased strength, Dizziness, Impaired flexibility,  Postural dysfunction, Prosthetic Dependency, Pain  Visit Diagnosis: Muscle weakness (generalized)  Other abnormalities of gait and mobility  Unsteadiness on feet  Abnormal posture     Problem List Patient Active Problem List   Diagnosis Date Noted  . Below knee amputation status, left 01/25/2018  . Wound dehiscence, surgical   . Foot amputation status, left 01/03/2018  . Status post left foot surgery 12/15/2017  . Snoring 06/07/2016  . Chest pain 01/22/2016  . Abnormal nuclear stress test 01/22/2016  . COPD (chronic obstructive pulmonary disease) ([place]) 01/27/2011  . Pre-operative cardiovascular examination 01/27/2011  . Nonspecific abnormal electrocardiogram (ECG) (EKG) 01/27/2011  . Murmur 01/27/2011  . DM2 (diabetes mellitus, type 2) ([place])   . HTN (hypertension)   . HLD (hyperlipidemia)   . Tobacco abuse    Jailey Booton, PTA  [facility] 05/05/2018, 11:45 AM  [facility] [address] [place], [place], [address] Phone: [phone]   Fax:  [phone]  Name: Troy [name] MRN: [id number] Date of Birth: 14-Jan-1961

## 2018-05-10 ENCOUNTER — Encounter: Payer: Self-pay | Admitting: Physical Therapy

## 2018-05-10 ENCOUNTER — Ambulatory Visit: Payer: Medicare HMO | Admitting: Physical Therapy

## 2018-05-10 DIAGNOSIS — R293 Abnormal posture: Secondary | ICD-10-CM

## 2018-05-10 DIAGNOSIS — M6281 Muscle weakness (generalized): Secondary | ICD-10-CM | POA: Diagnosis not present

## 2018-05-10 DIAGNOSIS — R2689 Other abnormalities of gait and mobility: Secondary | ICD-10-CM

## 2018-05-10 DIAGNOSIS — R2681 Unsteadiness on feet: Secondary | ICD-10-CM

## 2018-05-10 NOTE — Therapy (Signed)
[facility] [address] [place], [place], [address] Phone: [phone]   Fax:  [phone]  Physical Therapy Treatment  Patient Details  Name: Troy [name] MRN: [id number] Date of Birth: 16-Jan-1961 Referring Provider (PT): Erlinda Hong, [place]   Encounter Date: 05/10/2018  PT End of Session - 05/10/18 1245    Visit Number  5    Number of Visits  26    Date for PT Re-Evaluation  07/21/18    Authorization Type  AETNA Medicare    Authorization Time Period  $40 co-pay,  $4200oop, $2363.66 remaining prior to PT eval    PT Start Time  1105    PT Stop Time  1148    PT Time Calculation (min)  43 min    Equipment Utilized During Treatment  Gait belt    Activity Tolerance  Patient tolerated treatment well;Patient limited by pain    Behavior During Therapy  [facility] for tasks assessed/performed       Past Medical History:  Diagnosis Date  . Acute renal failure ([place])    in setting of NSAID use and orthopedic surgery 2010  . Anxiety and depression   . Chronic diastolic CHF (congestive heart failure) ([place])    a. Echo 6/17: severe conc LVH, vigorous EF, EF 65-70%, no dynamic obstruction, no RWMA, Gr 1 DD, mild TR  //  b. LHC 8/17: no sig CAD, LVEDP 28  . COPD (chronic obstructive pulmonary disease) ([place])   . Diabetic ulcer of left foot ([place])   . DM2 (diabetes mellitus, type 2) ([place])   . Family history of early CAD   . Fatty liver   . GERD (gastroesophageal reflux disease)   . History of amputation of foot ([place])    L trans-met // R toe  . History of cardiac catheterization    a. [place] 2002: irregs  //  b. LHC in 8/17: no sig CAD, apical DK, hyperdynamic LV, LVEDP 28  . History of kidney stones   . History of nuclear stress test    a. Nuc 7/17: Overall, intermediate risk nuclear stress test secondary to small size of apical lateral defect and reduced ejection fraction.  EF 43%  . HLD (hyperlipidemia)   . HTN (hypertension)   .  Injuries     crushing injury to both his feet in February 2010.   Marland Kitchen Kidney calculi   . Palpitations   . Tobacco abuse     Past Surgical History:  Procedure Laterality Date  . AMPUTATION Left 01/03/2018   Procedure: LEFT MIDFOOT AMPUTATION/REVISION MIDAMPUTAION;  Surgeon: Mcarthur Rossetti, MD;  Location: [facility];  Service: Orthopedics;  Laterality: Left;  . AMPUTATION Left 01/25/2018   Procedure: LEFT BELOW KNEE AMPUTATION;  Surgeon: [name], MD;  Location: [facility];  Service: Orthopedics;  Laterality: Left;  . BELOW KNEE LEG AMPUTATION Left 01/25/2018  . CARDIAC CATHETERIZATION N/A 01/22/2016   Procedure: Left Heart Cath and Coronary Angiography;  Surgeon: Peter M Martinique, MD;  Location: [facility];  Service: Cardiovascular;  Laterality: N/A;  . FOOT AMPUTATION Bilateral   . I&D EXTREMITY Left 12/15/2017   Procedure: IRRIGATION AND DEBRIDEMENT LEFT FOOT ULCER;  Surgeon: Mcarthur Rossetti, MD;  [facility];  Service: Orthopedics;  Laterality: Left;  . LITHOTRIPSY    . TENDON LENGTHENING Bilateral    calf  . TONSILLECTOMY      There were no vitals filed for this visit.  Subjective Assessment - 05/10/18  1152    Subjective  Pt reports working on standing at the sink and walking at home. Reports not wearing the shrinker at night, " can't sleep with it on."    Patient is accompained by:  Family member    Pertinent History  LTTA, DM2, neuropathy, COPD, HTN, HLD, CHF,    Limitations  Lifting;Standing;Walking;House hold activities    Patient Stated Goals  To use prosthesis to walk in community, fishing, go on vacations     Pain Onset  More than a month ago                       [facility] Adult PT Treatment/Exercise - 05/10/18 0001      Ambulation/Gait   Ambulation/Gait  Yes    Ambulation/Gait Assistance  5: Supervision    Ambulation/Gait Assistance Details  Working on even steplength.    Ambulation Distance (Feet)  115 Feet    Assistive device  Small  based quad cane    Gait Pattern  Step-through pattern;Decreased step length - right;Decreased stance time - left;Decreased stride length;Decreased weight shift to left;Trunk flexed    Ambulation Surface  Level;Indoor    Stairs  Yes    Stairs Assistance  5: Supervision    Stairs Assistance Details (indicate cue type and reason)  instructing pt on technique for alternating pattern with 1 rail and SBQC    Stair Management Technique  One rail Left;Forwards;With cane;Step to pattern;Alternating pattern    Number of Stairs  4   x4   Height of Stairs  6    Ramp  4: Min assist;5: Supervision   Min A with SBQC, Supervision with RW   Ramp Details (indicate cue type and reason)  cues for step through pattern    Curb  5: Supervision    Curb Details (indicate cue type and reason)  cues for safety with RW.      Prosthetics   Prosthetic Care Comments   Recommend 5 hrs 2x/day drying off when sweating; Recommend pt wear shrinker at night.    Current prosthetic wear tolerance (days/week)   3 hrs/3x day, everyday    Current prosthetic wear tolerance (#hours/day)   daily    Current prosthetic weight-bearing tolerance (hours/day)   Pain with anterior knee and lateral aspect on fibula in prosthesis, recommend pt adjust ply sock and follow up with  asking for added padding at next prosthesis appt.     Residual limb condition   intact with Tegaderm cover. Redness noted at knee.     Education Provided  Residual limb care;Skin check;Correct ply sock adjustment;Proper wear schedule/adjustment    Person(s) Educated  Patient;Spouse    Education Method  Explanation;Demonstration;Verbal cues;Tactile cues    Education Method  Verbalized understanding;Returned demonstration;Verbal cues required;Needs further instruction    Donning Prosthesis  Supervision    Doffing Prosthesis  Modified independent (device/increased time)             PT Education - 05/10/18 1244    Education Details  Recommend pt use walker for  gait especially for upcoming fishing tournament for safety.    Person(s) Educated  Patient;Spouse    Methods  Explanation    Comprehension  Verbalized understanding       PT Short Term Goals - 04/25/18 1800      PT SHORT TERM GOAL #1   Title  Patient verbalizes proper cleaning & demonstrates donning of prosthesis. (All STGs Target Date: 05/25/2018)    Time  1    Period  Months    Status  New    Target Date  05/25/18      PT SHORT TERM GOAL #2   Title  Patient tolerates wear >8 hrs total /day without increased skin issues or limb pain >6/10.     Time  1    Period  Months    Status  New    Target Date  05/25/18      PT SHORT TERM GOAL #3   Title  Patient standing balance without UE support reaching 5", scanning environment and picking up objects from floor with supervision.     Time  1    Period  Months    Status  New    Target Date  05/25/18      PT SHORT TERM GOAL #4   Title  Patient ambulates 300' with RW & prosthesis with supervision.     Time  1    Period  Months    Status  New    Target Date  05/25/18      PT SHORT TERM GOAL #5   Title  Patient negotiates ramps, curbs with RW & stairs with 1 rail & cane with supervision.     Time  1    Period  Months    Status  New    Target Date  05/25/18        PT Long Term Goals - 04/25/18 1800      PT LONG TERM GOAL #1   Title  Patient verbalizes & demonstrates proper prosthetic care to enable safe use of prosthesis. (All LTGs Target Date: 07/21/2018)    Time  3    Period  Months    Status  New    Target Date  07/21/18      PT LONG TERM GOAL #2   Title  Patient tolerates prosthesis wear >90% of awake hour without skin issues or limb pain >2/10 to enable function throughout his day.     Time  3    Period  Months    Status  New    Target Date  07/21/18      PT LONG TERM GOAL #3   Title  Berg Balance >45/56 to indicate lower fall risk.     Time  3    Period  Months    Status  New    Target Date  07/21/18       PT LONG TERM GOAL #4   Title  Patient ambulates 300' with LRAD & prosthesis modified independent for community mobility.     Time  3    Period  Months    Status  New    Target Date  07/21/18      PT LONG TERM GOAL #5   Title  Patient negotiates ramps, curbs & stairs with LRAD & prosthesis modified independent for community access.     Time  3    Period  Months    Status  New    Target Date  07/21/18      Additional Long Term Goals   Additional Long Term Goals  Yes      PT LONG TERM GOAL #6   Title  Patient ambulates around furniture with cane or less & prosthesis carrying household items modified independent.    Time  3    Period  Months    Status  New    Target Date  07/21/18  Plan - 05/10/18 1246    Clinical Impression Statement  Pt continues to report pain in residual limb from pressure with prosthesis donned at tibal tendon and lower lateral aspect of residual limb(fibula).  Pt only brought 4 ply socks but residual limb looked to be seated low in prosthesis; recommend pt bring more socks in next visit so that adjustments can be made during therapy if needed. Pt has been attempting gait with SBQC at home and reports even walking without an AD with spouse walking with him. Pt ambulates in gym at close supervision level on level surface with [facility]; recommend pt continue to use RW on outdoor surfaces for safety at this time.  Pt is making progress with greater balance during gait.                                                                       Rehab Potential  Good    PT Frequency  2x / week    PT Duration  Other (comment)   13 weeks (90 days)   PT Treatment/Interventions  ADLs/Self Care Home Management;Canalith Repostioning;Moist Heat;Ultrasound;DME Instruction;Gait training;Stair training;Functional mobility training;Therapeutic activities;Therapeutic exercise;Balance training;Neuromuscular re-education;Patient/family education;Prosthetic Training;Manual  techniques;Vestibular;Dry needling    PT Next Visit Plan  Review prosthetic care, instruct in prosthetic gait with SBQC, high level balance on compliant surfaces.     Consulted and Agree with Plan of Care  Patient;Family member/caregiver    Family Member Consulted  wife       Patient will benefit from skilled therapeutic intervention in order to improve the following deficits and impairments:  Abnormal gait, Decreased activity tolerance, Decreased balance, Cardiopulmonary status limiting activity, Decreased endurance, Decreased mobility, Decreased strength, Dizziness, Impaired flexibility, Postural dysfunction, Prosthetic Dependency, Pain  Visit Diagnosis: Muscle weakness (generalized)  Other abnormalities of gait and mobility  Unsteadiness on feet  Abnormal posture     Problem List Patient Active Problem List   Diagnosis Date Noted  . Below knee amputation status, left 01/25/2018  . Wound dehiscence, surgical   . Foot amputation status, left 01/03/2018  . Status post left foot surgery 12/15/2017  . Snoring 06/07/2016  . Chest pain 01/22/2016  . Abnormal nuclear stress test 01/22/2016  . COPD (chronic obstructive pulmonary disease) ([place]) 01/27/2011  . Pre-operative cardiovascular examination 01/27/2011  . Nonspecific abnormal electrocardiogram (ECG) (EKG) 01/27/2011  . Murmur 01/27/2011  . DM2 (diabetes mellitus, type 2) ([place])   . HTN (hypertension)   . HLD (hyperlipidemia)   . Tobacco abuse    Bjorn Loser, [place]  05/10/18, 1:04 PM [facility] [address] [place], [place], [address] Phone: [phone]   Fax:  [phone]  Name: Troy [name] MRN: [id number] Date of Birth: 1960-10-21

## 2018-05-16 ENCOUNTER — Ambulatory Visit: Payer: Medicare HMO

## 2018-05-17 ENCOUNTER — Encounter: Payer: Self-pay | Admitting: Physical Therapy

## 2018-05-17 ENCOUNTER — Ambulatory Visit: Payer: Medicare HMO | Admitting: Physical Therapy

## 2018-05-17 DIAGNOSIS — R293 Abnormal posture: Secondary | ICD-10-CM

## 2018-05-17 DIAGNOSIS — M6281 Muscle weakness (generalized): Secondary | ICD-10-CM

## 2018-05-17 DIAGNOSIS — R2681 Unsteadiness on feet: Secondary | ICD-10-CM

## 2018-05-17 DIAGNOSIS — R2689 Other abnormalities of gait and mobility: Secondary | ICD-10-CM

## 2018-05-17 NOTE — Therapy (Signed)
[facility] [address] [place], [place], [address] Phone: [phone]   Fax:  [phone]  Physical Therapy Treatment  Patient Details  Name: Troy [name] MRN: [id number] Date of Birth: 12/24/1960 Referring Provider (PT): Erlinda Hong, [place]   Encounter Date: 05/17/2018  PT End of Session - 05/17/18 1205    Visit Number  6    Number of Visits  26    Date for PT Re-Evaluation  07/21/18    Authorization Type  AETNA Medicare    Authorization Time Period  $40 co-pay,  $4200oop, $2363.66 remaining prior to PT eval    PT Start Time  1100    PT Stop Time  1143    PT Time Calculation (min)  43 min    Equipment Utilized During Treatment  Gait belt    Activity Tolerance  Patient tolerated treatment well;Patient limited by pain    Behavior During Therapy  [facility] for tasks assessed/performed       Past Medical History:  Diagnosis Date  . Acute renal failure ([place])    in setting of NSAID use and orthopedic surgery 2010  . Anxiety and depression   . Chronic diastolic CHF (congestive heart failure) ([place])    a. Echo 6/17: severe conc LVH, vigorous EF, EF 65-70%, no dynamic obstruction, no RWMA, Gr 1 DD, mild TR  //  b. LHC 8/17: no sig CAD, LVEDP 28  . COPD (chronic obstructive pulmonary disease) ([place])   . Diabetic ulcer of left foot ([place])   . DM2 (diabetes mellitus, type 2) ([place])   . Family history of early CAD   . Fatty liver   . GERD (gastroesophageal reflux disease)   . History of amputation of foot ([place])    L trans-met // R toe  . History of cardiac catheterization    a. [place] 2002: irregs  //  b. LHC in 8/17: no sig CAD, apical DK, hyperdynamic LV, LVEDP 28  . History of kidney stones   . History of nuclear stress test    a. Nuc 7/17: Overall, intermediate risk nuclear stress test secondary to small size of apical lateral defect and reduced ejection fraction.  EF 43%  . HLD (hyperlipidemia)   . HTN (hypertension)   .  Injuries     crushing injury to both his feet in February 2010.   Marland Kitchen Kidney calculi   . Palpitations   . Tobacco abuse     Past Surgical History:  Procedure Laterality Date  . AMPUTATION Left 01/03/2018   Procedure: LEFT MIDFOOT AMPUTATION/REVISION MIDAMPUTAION;  Surgeon: Mcarthur Rossetti, MD;  Location: [facility];  Service: Orthopedics;  Laterality: Left;  . AMPUTATION Left 01/25/2018   Procedure: LEFT BELOW KNEE AMPUTATION;  Surgeon: [name], MD;  Location: [facility];  Service: Orthopedics;  Laterality: Left;  . BELOW KNEE LEG AMPUTATION Left 01/25/2018  . CARDIAC CATHETERIZATION N/A 01/22/2016   Procedure: Left Heart Cath and Coronary Angiography;  Surgeon: Peter M Martinique, MD;  Location: [facility] CV LAB;  Service: Cardiovascular;  Laterality: N/A;  . FOOT AMPUTATION Bilateral   . I&D EXTREMITY Left 12/15/2017   Procedure: IRRIGATION AND DEBRIDEMENT LEFT FOOT ULCER;  Surgeon: Mcarthur Rossetti, MD;  [facility];  Service: Orthopedics;  Laterality: Left;  . LITHOTRIPSY    . TENDON LENGTHENING Bilateral    calf  . TONSILLECTOMY      There were no vitals filed for this visit.  Subjective Assessment - 05/17/18  1104    Subjective  Pt has been wearing the shrinker at night. Pt's pain doctor recommends treating nerve "burning it off" but pt declines at this time.     Patient is accompained by:  Family member    Pertinent History  LTTA, DM2, neuropathy, COPD, HTN, HLD, CHF,    Limitations  Lifting;Standing;Walking;House hold activities    Patient Stated Goals  To use prosthesis to walk in community, fishing, go on vacations     Currently in Pain?  Yes    Pain Score  7     Pain Location  Leg    Pain Orientation  Left    Pain Descriptors / Indicators  Aching;Throbbing    Pain Onset  More than a month ago                       [facility] Adult PT Treatment/Exercise - 05/17/18 0001      Transfers   Transfers  Sit to Stand;Stand to Sit    Sit to Stand  6:  Modified independent (Device/Increase time)    Stand to Sit  6: Modified independent (Device/Increase time)      Ambulation/Gait   Ambulation/Gait  Yes    Ambulation/Gait Assistance  4: Min guard;5: Supervision    Ambulation/Gait Assistance Details  Working on balance with SPC (quad tip), head turns, instructng on proper foot width and using tile colors for target.    Ambulation Distance (Feet)  230 Feet   x2 +115.   Assistive device  Straight cane   with quad tip.   Gait Pattern  Step-through pattern;Decreased step length - right;Decreased stance time - left;Decreased stride length;Decreased weight shift to left;Trunk flexed    Ambulation Surface  Level;Indoor    Ramp  Other (comment)   min guard, cues for sequence, gait pattern, SPC   Curb  5: Supervision   with [facility]   Curb Details (indicate cue type and reason)  cues for proper sequence      Prosthetics   Prosthetic Care Comments   adjusted ply socks from 6 to 8 ply, pt reports limb feels slightly better but continues to have pain.                                            Current prosthetic wear tolerance (days/week)    5hrs/2x day, everyday    Current prosthetic wear tolerance (#hours/day)   daily    Current prosthetic weight-bearing tolerance (hours/day)   Pain with anterior knee and lateral aspect on fibula in prosthesis, recommend pt adjust ply sock and follow up with  asking for added padding at next prosthesis appt.     Residual limb condition   skin intact, no open areas    Education Provided  Residual limb care;Skin check;Correct ply sock adjustment;Proper wear schedule/adjustment    Person(s) Educated  Patient;Spouse          Balance Exercises - 05/17/18 1202      Balance Exercises: Standing   Standing Eyes Opened  Wide (BOA)   upper trunk rotations, No UE support   Other Standing Exercises  instruction on picking objects from the floor, cues for foot placement, no UE support,  supervision.  PT Education - 05/17/18 1154    Education Details  How changes in temperature can effect phantom pains, how prosthesis keeps residual limb warmer, how to make a "foot pajama" for residual limb to help keep warm.  Discussed how pain in lateral aspect of residual limb (fibula head) can be from a wide BOS during weight bearing.                                               Person(s) Educated  Patient    Methods  Explanation    Comprehension  Verbalized understanding       PT Short Term Goals - 04/25/18 1800      PT SHORT TERM GOAL #1   Title  Patient verbalizes proper cleaning & demonstrates donning of prosthesis. (All STGs Target Date: 05/25/2018)    Time  1    Period  Months    Status  New    Target Date  05/25/18      PT SHORT TERM GOAL #2   Title  Patient tolerates wear >8 hrs total /day without increased skin issues or limb pain >6/10.     Time  1    Period  Months    Status  New    Target Date  05/25/18      PT SHORT TERM GOAL #3   Title  Patient standing balance without UE support reaching 5", scanning environment and picking up objects from floor with supervision.     Time  1    Period  Months    Status  New    Target Date  05/25/18      PT SHORT TERM GOAL #4   Title  Patient ambulates 300' with RW & prosthesis with supervision.     Time  1    Period  Months    Status  New    Target Date  05/25/18      PT SHORT TERM GOAL #5   Title  Patient negotiates ramps, curbs with RW & stairs with 1 rail & cane with supervision.     Time  1    Period  Months    Status  New    Target Date  05/25/18        PT Long Term Goals - 04/25/18 1800      PT LONG TERM GOAL #1   Title  Patient verbalizes & demonstrates proper prosthetic care to enable safe use of prosthesis. (All LTGs Target Date: 07/21/2018)    Time  3    Period  Months    Status  New    Target Date  07/21/18      PT LONG TERM GOAL #2   Title  Patient tolerates prosthesis wear >90% of awake hour  without skin issues or limb pain >2/10 to enable function throughout his day.     Time  3    Period  Months    Status  New    Target Date  07/21/18      PT LONG TERM GOAL #3   Title  Berg Balance >45/56 to indicate lower fall risk.     Time  3    Period  Months    Status  New    Target Date  07/21/18      PT LONG TERM GOAL #4   Title  Patient ambulates 300' with LRAD & prosthesis modified independent for community mobility.     Time  3    Period  Months    Status  New    Target Date  07/21/18      PT LONG TERM GOAL #5   Title  Patient negotiates ramps, curbs & stairs with LRAD & prosthesis modified independent for community access.     Time  3    Period  Months    Status  New    Target Date  07/21/18      Additional Long Term Goals   Additional Long Term Goals  Yes      PT LONG TERM GOAL #6   Title  Patient ambulates around furniture with cane or less & prosthesis carrying household items modified independent.    Time  3    Period  Months    Status  New    Target Date  07/21/18            Plan - 05/17/18 1206    Clinical Impression Statement  Skilled session focused on ply sock adjustment to address painful pressure areas, progressing gait with SPC (quad tip) including community barriers, and standing balance activities.  Pt reported a slight increase in comfort witih ply sock adjustment and performed gait and standing balance at a min guard to supervision level with instruction for technique.                                                            Rehab Potential  Good    PT Frequency  2x / week    PT Duration  Other (comment)   13 weeks (90 days)   PT Treatment/Interventions  ADLs/Self Care Home Management;Canalith Repostioning;Moist Heat;Ultrasound;DME Instruction;Gait training;Stair training;Functional mobility training;Therapeutic activities;Therapeutic exercise;Balance training;Neuromuscular re-education;Patient/family education;Prosthetic Training;Manual  techniques;Vestibular;Dry needling    PT Next Visit Plan  Check STGs next week. Review prosthetic care, instruct in prosthetic gait with SPC, high level balance on compliant surfaces.     Consulted and Agree with Plan of Care  Patient;Family member/caregiver    Family Member Consulted  wife       Patient will benefit from skilled therapeutic intervention in order to improve the following deficits and impairments:  Abnormal gait, Decreased activity tolerance, Decreased balance, Cardiopulmonary status limiting activity, Decreased endurance, Decreased mobility, Decreased strength, Dizziness, Impaired flexibility, Postural dysfunction, Prosthetic Dependency, Pain  Visit Diagnosis: Muscle weakness (generalized)  Other abnormalities of gait and mobility  Unsteadiness on feet  Abnormal posture     Problem List Patient Active Problem List   Diagnosis Date Noted  . Below knee amputation status, left 01/25/2018  . Wound dehiscence, surgical   . Foot amputation status, left 01/03/2018  . Status post left foot surgery 12/15/2017  . Snoring 06/07/2016  . Chest pain 01/22/2016  . Abnormal nuclear stress test 01/22/2016  . COPD (chronic obstructive pulmonary disease) ([place]) 01/27/2011  . Pre-operative cardiovascular examination 01/27/2011  . Nonspecific abnormal electrocardiogram (ECG) (EKG) 01/27/2011  . Murmur 01/27/2011  . DM2 (diabetes mellitus, type 2) ([place])   . HTN (hypertension)   . HLD (hyperlipidemia)   . Tobacco abuse    Bjorn Loser, [place]  05/17/18, 12:17 PM [facility] [address] [place],  [place], [address] Phone: [phone]   Fax:  [phone]  Name: OTHAL KUBITZ MRN: [id number] Date of Birth: December 13, 1960

## 2018-05-22 ENCOUNTER — Ambulatory Visit: Payer: Medicare HMO | Attending: Physician Assistant | Admitting: Physical Therapy

## 2018-05-22 ENCOUNTER — Encounter: Payer: Self-pay | Admitting: Physical Therapy

## 2018-05-22 DIAGNOSIS — R2681 Unsteadiness on feet: Secondary | ICD-10-CM

## 2018-05-22 DIAGNOSIS — R2689 Other abnormalities of gait and mobility: Secondary | ICD-10-CM

## 2018-05-22 DIAGNOSIS — R293 Abnormal posture: Secondary | ICD-10-CM

## 2018-05-22 DIAGNOSIS — R296 Repeated falls: Secondary | ICD-10-CM | POA: Diagnosis present

## 2018-05-22 DIAGNOSIS — M6281 Muscle weakness (generalized): Secondary | ICD-10-CM | POA: Diagnosis not present

## 2018-05-22 NOTE — Therapy (Signed)
[facility] [address] [place], [place], [address] Phone: [phone]   Fax:  [phone]  Physical Therapy Treatment  Patient Details  Name: Troy [name] MRN: [id number] Date of Birth: 04/19/1961 Referring Provider (PT): Erlinda Hong, [place]   Encounter Date: 05/22/2018  PT End of Session - 05/22/18 1253    Visit Number  7    Number of Visits  26    Date for PT Re-Evaluation  07/21/18    Authorization Type  [employer]ETNA Medicare    Authorization Time Period  $40 co-pay,  $4200oop, $2363.66 remaining prior to PT eval    PT Start Time  1106    PT Stop Time  1145    PT Time Calculation (min)  39 min    Equipment Utilized During Treatment  Gait belt    Activity Tolerance  Patient tolerated treatment well;Patient limited by pain    Behavior During Therapy  [facility] for tasks assessed/performed       Past Medical History:  Diagnosis Date  . Acute renal failure ([place])    in setting of NSAID use and orthopedic surgery 2010  . Anxiety and depression   . Chronic diastolic CHF (congestive heart failure) ([place])    a. Echo 6/17: severe conc LVH, vigorous EF, EF 65-70%, no dynamic obstruction, no RWMA, Gr 1 DD, mild TR  //  b. LHC 8/17: no sig CAD, LVEDP 28  . COPD (chronic obstructive pulmonary disease) ([place])   . Diabetic ulcer of left foot ([place])   . DM2 (diabetes mellitus, type 2) ([place])   . Family history of early CAD   . Fatty liver   . GERD (gastroesophageal reflux disease)   . History of amputation of foot ([place])    L trans-met // R toe  . History of cardiac catheterization    a. [place] 2002: irregs  //  b. LHC in 8/17: no sig CAD, apical DK, hyperdynamic LV, LVEDP 28  . History of kidney stones   . History of nuclear stress test    a. Nuc 7/17: Overall, intermediate risk nuclear stress test secondary to small size of apical lateral defect and reduced ejection fraction.  EF 43%  . HLD (hyperlipidemia)   . HTN (hypertension)   .  Injuries     crushing injury to both his feet in February 2010.   Marland Kitchen Kidney calculi   . Palpitations   . Tobacco abuse     Past Surgical History:  Procedure Laterality Date  . AMPUTATION Left 01/03/2018   Procedure: LEFT MIDFOOT AMPUTATION/REVISION MIDAMPUTAION;  Surgeon: Mcarthur Rossetti, MD;  Location: [facility];  Service: Orthopedics;  Laterality: Left;  . AMPUTATION Left 01/25/2018   Procedure: LEFT BELOW KNEE AMPUTATION;  Surgeon: [name], MD;  Location: [facility];  Service: Orthopedics;  Laterality: Left;  . BELOW KNEE LEG AMPUTATION Left 01/25/2018  . CARDIAC CATHETERIZATION N/A 01/22/2016   Procedure: Left Heart Cath and Coronary Angiography;  Surgeon: Peter M Martinique, MD;  Location: [facility];  Service: Cardiovascular;  Laterality: N/A;  . FOOT AMPUTATION Bilateral   . I&D EXTREMITY Left 12/15/2017   Procedure: IRRIGATION AND DEBRIDEMENT LEFT FOOT ULCER;  Surgeon: Mcarthur Rossetti, MD;  [facility];  Service: Orthopedics;  Laterality: Left;  . LITHOTRIPSY    . TENDON LENGTHENING Bilateral    calf  . TONSILLECTOMY      There were no vitals filed for this visit.  Subjective Assessment - 05/22/18  1108    Subjective  Pt uses SPC in community and no device in the house, continues to have painful pressure in residual limb with weight bearing.    Patient is accompained by:  Family member    Pertinent History  LTTA, DM2, neuropathy, COPD, HTN, HLD, CHF,    Limitations  Lifting;Standing;Walking;House hold activities    Patient Stated Goals  To use prosthesis to walk in community, fishing, go on vacations     Currently in Pain?  Yes    Pain Score  8     Pain Location  Leg    Pain Onset  More than a month ago                       [facility] Adult PT Treatment/Exercise - 05/22/18 0001      Transfers   Transfers  Sit to Stand;Stand to Sit    Sit to Stand  6: Modified independent (Device/Increase time)    Stand to Sit  6: Modified independent  (Device/Increase time)      Ambulation/Gait   Ambulation/Gait  Yes    Ambulation/Gait Assistance  5: Supervision    Ambulation/Gait Assistance Details  balance without AD and tolerance to longer gait with RW, working on proper steplength and step width.    Ambulation Distance (Feet)  300 Feet   with RW + 115 without AD.   Assistive device  Rolling walker;None    Gait Pattern  Step-through pattern;Decreased step length - right;Decreased stance time - left;Decreased stride length;Decreased weight shift to left;Trunk flexed    Ambulation Surface  Level;Indoor      Prosthetics   Prosthetic Care Comments   Instructed pt on ice massage for pain and edema in residual limb.  Discussed with pt the possibility that he has progressed himself off walker and all AD at home too quickly especailly since he continues to have pain with weight bearing.    Current prosthetic wear tolerance (days/week)    5hrs/2x day, everyday    Current prosthetic wear tolerance (#hours/day)   daily    Current prosthetic weight-bearing tolerance (hours/day)   Pain with anterior knee and lateral aspect on fibula in prosthesis, recommend pt adjust ply sock and follow up with  asking for added padding at next prosthesis appt.     Residual limb condition   skin intact, painful red skin on lower anteroir tib. with edema    Education Provided  Residual limb care;Skin check;Correct ply sock adjustment;Proper wear schedule/adjustment;Proper weight-bearing schedule/adjustment    Person(s) Educated  Patient    Education Method  Explanation;Demonstration;Tactile cues;Verbal cues;Handout    Education Method  Verbalized understanding;Returned demonstration;Verbal cues required;Needs further instruction;Tactile cues required    Donning Prosthesis  Supervision    Doffing Prosthesis  Modified independent (device/increased time)          Balance Exercises - 05/22/18 1250      Balance Exercises: Standing   Step Ups  Forward;4 inch;UE  support 1;Intermittent UE support   working on weight bearing and balance on prosthesis   Other Standing Exercises  walking forward with resistence (blue band) pulling toward sound sound so pt could practise weight shifting and pushing through left prosthesis.        PT Education - 05/22/18 1229    Education Details  Instructed pt on ice massage for pain and edema in residual limb.  Discussed with pt the concern that he has progressed himself off walker and all AD at  home to quickly especailly since he continues to have pain with weight bearing.    Person(s) Educated  Patient    Methods  Explanation;Demonstration;Handout    Comprehension  Verbalized understanding;Returned demonstration       PT Short Term Goals - 05/22/18 1300      PT SHORT TERM GOAL #1   Title  Patient verbalizes proper cleaning & demonstrates donning of prosthesis. (All STGs Target Date: 05/25/2018)    Time  1    Period  Months    Status  New      PT SHORT TERM GOAL #2   Title  Patient tolerates wear >8 hrs total /day without increased skin issues or limb pain >6/10.     Time  1    Period  Months    Status  New      PT SHORT TERM GOAL #3   Title  Patient standing balance without UE support reaching 5", scanning environment and picking up objects from floor with supervision.     Time  1    Period  Months    Status  New      PT SHORT TERM GOAL #4   Title  Patient ambulates 300' with RW & prosthesis with supervision.     Baseline  05/22/18 met.    Time  1    Period  Months    Status  Achieved      PT SHORT TERM GOAL #5   Title  Patient negotiates ramps, curbs with RW & stairs with 1 rail & cane with supervision.     Time  1    Period  Months    Status  New        PT Long Term Goals - 04/25/18 1800      PT LONG TERM GOAL #1   Title  Patient verbalizes & demonstrates proper prosthetic care to enable safe use of prosthesis. (All LTGs Target Date: 07/21/2018)    Time  3    Period  Months    Status   New    Target Date  07/21/18      PT LONG TERM GOAL #2   Title  Patient tolerates prosthesis wear >90% of awake hour without skin issues or limb pain >2/10 to enable function throughout his day.     Time  3    Period  Months    Status  New    Target Date  07/21/18      PT LONG TERM GOAL #3   Title  Berg Balance >45/56 to indicate lower fall risk.     Time  3    Period  Months    Status  New    Target Date  07/21/18      PT LONG TERM GOAL #4   Title  Patient ambulates 300' with LRAD & prosthesis modified independent for community mobility.     Time  3    Period  Months    Status  New    Target Date  07/21/18      PT LONG TERM GOAL #5   Title  Patient negotiates ramps, curbs & stairs with LRAD & prosthesis modified independent for community access.     Time  3    Period  Months    Status  New    Target Date  07/21/18      Additional Long Term Goals   Additional Long Term Goals  Yes      PT  LONG TERM GOAL #6   Title  Patient ambulates around furniture with cane or less & prosthesis carrying household items modified independent.    Time  3    Period  Months    Status  New    Target Date  07/21/18            Plan - 05/22/18 1255    Clinical Impression Statement  Pt continues to have painful areas on residual limb with weight bearing,  PTA with evaluating PT assessed painful area and PT recommended pt ice massage 3-5x/day and that prosthetist look into posterior padding to help with pressure (this PTA left message for prothestist on PT recommendation).  Instructed pt on ice massage for pain and edema in residual limb.  Discussed with pt the concern that he has progressed himself off walker and all AD at home too quickly especially since he continues to have pain with weight bearing.  Pt met STG# 4                                                                                            Rehab Potential  Good    PT Frequency  2x / week    PT Duration  Other (comment)    13 weeks (90 days)   PT Treatment/Interventions  ADLs/Self Care Home Management;Canalith Repostioning;Moist Heat;Ultrasound;DME Instruction;Gait training;Stair training;Functional mobility training;Therapeutic activities;Therapeutic exercise;Balance training;Neuromuscular re-education;Patient/family education;Prosthetic Training;Manual techniques;Vestibular;Dry needling    PT Next Visit Plan  Check remaining STGs next visit. Review prosthetic care, instruct in prosthetic gait with SPC, high level balance on compliant surfaces.     Consulted and Agree with Plan of Care  Patient;Family member/caregiver    Family Member Consulted  wife       Patient will benefit from skilled therapeutic intervention in order to improve the following deficits and impairments:  Abnormal gait, Decreased activity tolerance, Decreased balance, Cardiopulmonary status limiting activity, Decreased endurance, Decreased mobility, Decreased strength, Dizziness, Impaired flexibility, Postural dysfunction, Prosthetic Dependency, Pain  Visit Diagnosis: Muscle weakness (generalized)  Other abnormalities of gait and mobility  Unsteadiness on feet  Abnormal posture     Problem List Patient Active Problem List   Diagnosis Date Noted  . Below knee amputation status, left 01/25/2018  . Wound dehiscence, surgical   . Foot amputation status, left 01/03/2018  . Status post left foot surgery 12/15/2017  . Snoring 06/07/2016  . Chest pain 01/22/2016  . Abnormal nuclear stress test 01/22/2016  . COPD (chronic obstructive pulmonary disease) ([place]) 01/27/2011  . Pre-operative cardiovascular examination 01/27/2011  . Nonspecific abnormal electrocardiogram (ECG) (EKG) 01/27/2011  . Murmur 01/27/2011  . DM2 (diabetes mellitus, type 2) ([place])   . HTN (hypertension)   . HLD (hyperlipidemia)   . Tobacco abuse     Bjorn Loser, [place]  05/22/18, 1:03 PM [facility] [address] [place], [place], [address] Phone: [phone]   Fax:  [phone]  Name: Troy [name] MRN: [id number] Date of Birth: Oct 23, 1960

## 2018-05-22 NOTE — Patient Instructions (Signed)
Ice Massage and Cold Packs Home Program  Ice and cold packs are used so that we can return the muscle to it's natural resting state without causing more pain, which can lead to more spasm, etc.  Icing and cold packs are also used to reduce swelling, which can lead to pain and stiffness.  COLD PACKS Cold packs should be placed circumferentially around the swollen area (ie., the wrist, etc.).  A paper towel can be placed over the area before the cold pack is applied.  A towel may be wrapped around the outside of the cold pack to keep the cold in.  The swollen area should be elevated with the cold pack, if possible.  ICE MASSAGE Fill a 4 ounce paper cup three-quarters full and put it in a freezer until it is frozen.  When ready to use, tear off about 1 inch of the cup so that some of the ice is showing while the bottom of the cup can be used to hold onto. Massage the entire muscle area as instructed by your therapist.  You may use circular or up and down strokes, but do not hold the ice in one spot.  Four phases to the ice massage and cold pack application: 1. Cold: which you feel when you first apply the ice. 2. Ache: after a few minutes 3. Burning: after approximately five minutes, it will feel like your skin is burning.  At this point, remove the ice for a minute or so. 4. Numbness: THIS IS THE CRUCIAL PHASE!!! Return the ice or cold pack and massage until all the burning disappears.  This signals the end of cryotherapy.  The entire procedure should take ten to fifteen minutes while using the cold pack.  Do Not perform the ice massage for more than seven minutes on a small area or more than ten minutes on a large area.  Cryotherapy should be performed after exercises, when edema occurs, or when an area is painful. 

## 2018-05-24 ENCOUNTER — Ambulatory Visit: Payer: Medicare HMO | Admitting: Physical Therapy

## 2018-05-26 ENCOUNTER — Other Ambulatory Visit: Payer: Self-pay | Admitting: Cardiology

## 2018-05-26 ENCOUNTER — Other Ambulatory Visit: Payer: Self-pay

## 2018-05-26 DIAGNOSIS — J449 Chronic obstructive pulmonary disease, unspecified: Secondary | ICD-10-CM

## 2018-05-26 MED ORDER — FUROSEMIDE 80 MG PO TABS: 40.0000 mg | ORAL_TABLET | Freq: Every day | ORAL | 0 refills | Status: DC

## 2018-05-29 ENCOUNTER — Ambulatory Visit: Payer: Medicare HMO | Admitting: Physical Therapy

## 2018-05-31 ENCOUNTER — Ambulatory Visit: Payer: Medicare HMO | Admitting: Physical Therapy

## 2018-05-31 ENCOUNTER — Encounter: Payer: Self-pay | Admitting: Physical Therapy

## 2018-05-31 DIAGNOSIS — M6281 Muscle weakness (generalized): Secondary | ICD-10-CM | POA: Diagnosis not present

## 2018-05-31 DIAGNOSIS — R2681 Unsteadiness on feet: Secondary | ICD-10-CM

## 2018-05-31 DIAGNOSIS — R2689 Other abnormalities of gait and mobility: Secondary | ICD-10-CM

## 2018-05-31 NOTE — Therapy (Signed)
[facility] [address] [place], [place], [address] Phone: [phone]   Fax:  [phone]  Physical Therapy Treatment  Patient Details  Name: Troy [name] MRN: [id number] Date of Birth: Apr 20, 1961 Referring Provider (PT): Erlinda Hong, [place]   Encounter Date: 05/31/2018  PT End of Session - 05/31/18 1106    Visit Number  8    Number of Visits  26    Date for PT Re-Evaluation  07/21/18    Authorization Type  [employer] Medicare    Authorization Time Period  $40 co-pay,  $4200oop, $2363.66 remaining prior to PT eval    PT Start Time  1105    PT Stop Time  1130   ended early so pt can get to [employer] for prosthetic adjusments.    PT Time Calculation (min)  25 min    Equipment Utilized During Treatment  Gait belt    Activity Tolerance  Patient tolerated treatment well;Patient limited by pain;Other (comment)   limited due to new onset of skin breakdown   Behavior During Therapy  [facility] for tasks assessed/performed       Past Medical History:  Diagnosis Date  . Acute renal failure ([place])    in setting of NSAID use and orthopedic surgery 2010  . Anxiety and depression   . Chronic diastolic CHF (congestive heart failure) ([place])    a. Echo 6/17: severe conc LVH, vigorous EF, EF 65-70%, no dynamic obstruction, no RWMA, Gr 1 DD, mild TR  //  b. LHC 8/17: no sig CAD, LVEDP 28  . COPD (chronic obstructive pulmonary disease) ([place])   . Diabetic ulcer of left foot ([place])   . DM2 (diabetes mellitus, type 2) ([place])   . Family history of early CAD   . Fatty liver   . GERD (gastroesophageal reflux disease)   . History of amputation of foot ([place])    L trans-met // R toe  . History of cardiac catheterization    a. [place] 2002: irregs  //  b. LHC in 8/17: no sig CAD, apical DK, hyperdynamic LV, LVEDP 28  . History of kidney stones   . History of nuclear stress test    a. Nuc 7/17: Overall, intermediate risk nuclear stress test secondary to  small size of apical lateral defect and reduced ejection fraction.  EF 43%  . HLD (hyperlipidemia)   . HTN (hypertension)   . Injuries     crushing injury to both his feet in February 2010.   [facility] calculi   . Palpitations   . Tobacco abuse     Past Surgical History:  Procedure Laterality Date  . AMPUTATION Left 01/03/2018   Procedure: LEFT MIDFOOT AMPUTATION/REVISION MIDAMPUTAION;  Surgeon: Mcarthur Rossetti, MD;  Location: [facility];  Service: Orthopedics;  Laterality: Left;  . AMPUTATION Left 01/25/2018   Procedure: LEFT BELOW KNEE AMPUTATION;  Surgeon: [name], MD;  Location: [facility];  Service: Orthopedics;  Laterality: Left;  . BELOW KNEE LEG AMPUTATION Left 01/25/2018  . CARDIAC CATHETERIZATION N/A 01/22/2016   Procedure: Left Heart Cath and Coronary Angiography;  Surgeon: Peter M Martinique, MD;  Location: [facility];  Service: Cardiovascular;  Laterality: N/A;  . FOOT AMPUTATION Bilateral   . I&D EXTREMITY Left 12/15/2017   Procedure: IRRIGATION AND DEBRIDEMENT LEFT FOOT ULCER;  Surgeon: Mcarthur Rossetti, MD;  Location: [facility];  Service: Orthopedics;  Laterality: Left;  . LITHOTRIPSY    . TENDON LENGTHENING Bilateral  calf  . TONSILLECTOMY      There were no vitals filed for this visit.  Subjective Assessment - 05/31/18 1104    Subjective  Continues to have pain with prosthesis wear. Demo's antalgic gait pattern with walking into clinic today.     Patient is accompained by:  Family member    Pertinent History  LTTA, DM2, neuropathy, COPD, HTN, HLD, CHF,    Limitations  Other (comment)    Patient Stated Goals  To use prosthesis to walk in community, fishing, go on vacations     Currently in Pain?  Yes    Pain Score  8     Pain Location  Leg    Pain Orientation  Left    Pain Descriptors / Indicators  Aching;Throbbing    Pain Type  Chronic pain    Pain Onset  More than a month ago    Pain Frequency  Constant    Aggravating Factors   wearing prosthesis  with mobility    Pain Relieving Factors  removing prosthesis.          [facility] Adult PT Treatment/Exercise - 05/31/18 1115      Transfers   Transfers  Sit to Stand;Stand to Sit    Sit to Stand  6: Modified independent (Device/Increase time)    Stand to Sit  6: Modified independent (Device/Increase time)      Ambulation/Gait   Ambulation/Gait  Yes    Ambulation/Gait Assistance  4: Min guard;5: Supervision    Ambulation/Gait Assistance Details  pt using cane today despite education to use RW at last session to offload prosthesis. contineus with antalgic gait pattern wtih decreased stance/weight shifting onto prosthesis. Once again encouraged pt to return to use of RW for 1 week to allow for pain to subside after prosthesis adjusted. provided education that offloading the prosthesis by using both UE's for support would prevent further irritaiton to the limb. both patient and spouse verbalized understanding.     Ambulation Distance (Feet)  100 Feet   x2   Assistive device  Straight cane;Prosthesis    Gait Pattern  Step-through pattern;Decreased stance time - left;Decreased stride length;Decreased weight shift to left;Antalgic;Trunk flexed    Ambulation Surface  Level;Indoor      Prosthetics   Prosthetic Care Comments   ice massage to swollen area for 6 minutes. Tegaderm used to cover skin breakdown area for barrier from liner. Pt and spouse educated on the use of antiperspirant on lower portion of limb for sweat/heat rash management and on use of baby oil on patella/upper thigh to create a barrier to decrease friction rubbing of liner on skin. Both verbalized understanding. Baby oil applied in session today along with instruction. Call placed to [facility] regarding new skin breakdown, pt left early to head over to see prosthetist for ajustments.     Current prosthetic wear tolerance (#hours/day)   daily    Residual limb condition   3 areas of skin breakdown noted along tibial crest, swelling noted  at lateral aspect just above incision line.     Education Provided  Skin check;Residual limb care;Proper wear schedule/adjustment;Proper weight-bearing schedule/adjustment    Person(s) Educated  Patient;Spouse    Education Method  Explanation;Demonstration;Verbal cues    Education Method  Verbalized understanding;Returned demonstration;Verbal cues required;Needs further instruction    Donning Prosthesis  Supervision    Doffing Prosthesis  Modified independent (device/increased time)          PT Short Term Goals - 05/22/18 1300  PT SHORT TERM GOAL #1   Title  Patient verbalizes proper cleaning & demonstrates donning of prosthesis. (All STGs Target Date: 05/25/2018)    Time  1    Period  Months    Status  New      PT SHORT TERM GOAL #2   Title  Patient tolerates wear >8 hrs total /day without increased skin issues or limb pain >6/10.     Time  1    Period  Months    Status  New      PT SHORT TERM GOAL #3   Title  Patient standing balance without UE support reaching 5", scanning environment and picking up objects from floor with supervision.     Time  1    Period  Months    Status  New      PT SHORT TERM GOAL #4   Title  Patient ambulates 300' with RW & prosthesis with supervision.     Baseline  05/22/18 met.    Time  1    Period  Months    Status  Achieved      PT SHORT TERM GOAL #5   Title  Patient negotiates ramps, curbs with RW & stairs with 1 rail & cane with supervision.     Time  1    Period  Months    Status  New        PT Long Term Goals - 04/25/18 1800      PT LONG TERM GOAL #1   Title  Patient verbalizes & demonstrates proper prosthetic care to enable safe use of prosthesis. (All LTGs Target Date: 07/21/2018)    Time  3    Period  Months    Status  New    Target Date  07/21/18      PT LONG TERM GOAL #2   Title  Patient tolerates prosthesis wear >90% of awake hour without skin issues or limb pain >2/10 to enable function throughout his day.     Time   3    Period  Months    Status  New    Target Date  07/21/18      PT LONG TERM GOAL #3   Title  Berg Balance >45/56 to indicate lower fall risk.     Time  3    Period  Months    Status  New    Target Date  07/21/18      PT LONG TERM GOAL #4   Title  Patient ambulates 300' with LRAD & prosthesis modified independent for community mobility.     Time  3    Period  Months    Status  New    Target Date  07/21/18      PT LONG TERM GOAL #5   Title  Patient negotiates ramps, curbs & stairs with LRAD & prosthesis modified independent for community access.     Time  3    Period  Months    Status  New    Target Date  07/21/18      Additional Long Term Goals   Additional Long Term Goals  Yes      PT LONG TERM GOAL #6   Title  Patient ambulates around furniture with cane or less & prosthesis carrying household items modified independent.    Time  3    Period  Months    Status  New    Target Date  07/21/18  Plan - 05/31/18 1107    Clinical Impression Statement  Pt now has popliteal pad inside socket and also had extra padding added over his pretibial pads from last appt with prosthetist. He now has skin breakdown along his tibial creast. Pt is to see prosthesist today to have this readdressed. Continues with pain with weight bearing and swelling on limb. Continued with ice massage today to address edema. Reinforced return to RW to allow for limb soreness to subside. Primary PT made aware of new issues and in agreement with plan performed today. The pt should benefit from continued PT to progress toward goals.     Rehab Potential  Good    PT Frequency  2x / week    PT Duration  Other (comment)   13 weeks (90 days)   PT Treatment/Interventions  ADLs/Self Care Home Management;Canalith Repostioning;Moist Heat;Ultrasound;DME Instruction;Gait training;Stair training;Functional mobility training;Therapeutic activities;Therapeutic exercise;Balance training;Neuromuscular  re-education;Patient/family education;Prosthetic Training;Manual techniques;Vestibular;Dry needling    PT Next Visit Plan  how did it go with [place] on Wed?, address pain and swelling as needed; check remaining STGs if pain allows; begin to address balance deficits.     Consulted and Agree with Plan of Care  Patient;Family member/caregiver    Family Member Consulted  wife       Patient will benefit from skilled therapeutic intervention in order to improve the following deficits and impairments:  Abnormal gait, Decreased activity tolerance, Decreased balance, Cardiopulmonary status limiting activity, Decreased endurance, Decreased mobility, Decreased strength, Dizziness, Impaired flexibility, Postural dysfunction, Prosthetic Dependency, Pain  Visit Diagnosis: Muscle weakness (generalized)  Other abnormalities of gait and mobility  Unsteadiness on feet     Problem List Patient Active Problem List   Diagnosis Date Noted  . Below knee amputation status, left 01/25/2018  . Wound dehiscence, surgical   . Foot amputation status, left 01/03/2018  . Status post left foot surgery 12/15/2017  . Snoring 06/07/2016  . Chest pain 01/22/2016  . Abnormal nuclear stress test 01/22/2016  . COPD (chronic obstructive pulmonary disease) ([place]) 01/27/2011  . Pre-operative cardiovascular examination 01/27/2011  . Nonspecific abnormal electrocardiogram (ECG) (EKG) 01/27/2011  . Murmur 01/27/2011  . DM2 (diabetes mellitus, type 2) ([place])   . HTN (hypertension)   . HLD (hyperlipidemia)   . Tobacco abuse    [place], [place], [facility] [address], [place], [place] [address] [phone] 05/31/18, 3:29 PM  Name: Troy [name] MRN: [id number] Date of Birth: 1961/06/11

## 2018-06-05 ENCOUNTER — Ambulatory Visit: Payer: Medicare HMO | Admitting: Physical Therapy

## 2018-06-05 ENCOUNTER — Telehealth: Payer: Self-pay | Admitting: Physical Therapy

## 2018-06-05 NOTE — Telephone Encounter (Signed)
Called pt and spoke to wife. Wife states pt just didn't want to come to therapy today.  States that he's been sleeping a lot and is just tired.  States that skin looks good since last session but that pt does not want to use the antiperspirant on residual limb says, " it makes it sweat worse." Bjorn Loser, PTA  06/05/18, 11:44 AM

## 2018-06-07 ENCOUNTER — Ambulatory Visit: Payer: Medicare HMO | Admitting: Physical Therapy

## 2018-06-12 ENCOUNTER — Ambulatory Visit: Payer: Medicare HMO | Admitting: Physical Therapy

## 2018-06-12 ENCOUNTER — Encounter: Payer: Self-pay | Admitting: Physical Therapy

## 2018-06-12 DIAGNOSIS — M6281 Muscle weakness (generalized): Secondary | ICD-10-CM

## 2018-06-12 DIAGNOSIS — R296 Repeated falls: Secondary | ICD-10-CM

## 2018-06-12 DIAGNOSIS — R2681 Unsteadiness on feet: Secondary | ICD-10-CM

## 2018-06-12 DIAGNOSIS — R2689 Other abnormalities of gait and mobility: Secondary | ICD-10-CM

## 2018-06-12 DIAGNOSIS — R293 Abnormal posture: Secondary | ICD-10-CM

## 2018-06-13 NOTE — Therapy (Signed)
[facility] [address] [place], [place], [address] Phone: [phone]   Fax:  [phone]  Physical Therapy Treatment  Patient Details  Name: Troy [name] MRN: [id number] Date of Birth: February 01, 1961 Referring Provider (PT): Erlinda Hong, [place]   Encounter Date: 06/12/2018  PT End of Session - 06/12/18 1153    Visit Number  9    Number of Visits  26    Date for PT Re-Evaluation  07/21/18    Authorization Type  [employer] Medicare    Authorization Time Period  $40 co-pay,  $4200oop, $2363.66 remaining prior to PT eval    PT Start Time  1100    PT Stop Time  1145    PT Time Calculation (min)  45 min    Equipment Utilized During Treatment  Gait belt    Activity Tolerance  Patient tolerated treatment well;Patient limited by pain;Other (comment)   limited due to new onset of skin breakdown   Behavior During Therapy  [facility] for tasks assessed/performed       Past Medical History:  Diagnosis Date  . Acute renal failure ([place])    in setting of NSAID use and orthopedic surgery 2010  . Anxiety and depression   . Chronic diastolic CHF (congestive heart failure) ([place])    a. Echo 6/17: severe conc LVH, vigorous EF, EF 65-70%, no dynamic obstruction, no RWMA, Gr 1 DD, mild TR  //  b. LHC 8/17: no sig CAD, LVEDP 28  . COPD (chronic obstructive pulmonary disease) ([place])   . Diabetic ulcer of left foot ([place])   . DM2 (diabetes mellitus, type 2) ([place])   . Family history of early CAD   . Fatty liver   . GERD (gastroesophageal reflux disease)   . History of amputation of foot ([place])    L trans-met // R toe  . History of cardiac catheterization    a. [place] 2002: irregs  //  b. LHC in 8/17: no sig CAD, apical DK, hyperdynamic LV, LVEDP 28  . History of kidney stones   . History of nuclear stress test    a. Nuc 7/17: Overall, intermediate risk nuclear stress test secondary to small size of apical lateral defect and reduced ejection fraction.  EF  43%  . HLD (hyperlipidemia)   . HTN (hypertension)   . Injuries     crushing injury to both his feet in February 2010.   Marland Kitchen Kidney calculi   . Palpitations   . Tobacco abuse     Past Surgical History:  Procedure Laterality Date  . AMPUTATION Left 01/03/2018   Procedure: LEFT MIDFOOT AMPUTATION/REVISION MIDAMPUTAION;  Surgeon: Mcarthur Rossetti, MD;  Location: [facility];  Service: Orthopedics;  Laterality: Left;  . AMPUTATION Left 01/25/2018   Procedure: LEFT BELOW KNEE AMPUTATION;  Surgeon: [name], MD;  Location: [facility];  Service: Orthopedics;  Laterality: Left;  . BELOW KNEE LEG AMPUTATION Left 01/25/2018  . CARDIAC CATHETERIZATION N/A 01/22/2016   Procedure: Left Heart Cath and Coronary Angiography;  Surgeon: Peter M Martinique, MD;  Location: [facility];  Service: Cardiovascular;  Laterality: N/A;  . FOOT AMPUTATION Bilateral   . I&D EXTREMITY Left 12/15/2017   Procedure: IRRIGATION AND DEBRIDEMENT LEFT FOOT ULCER;  Surgeon: Mcarthur Rossetti, MD;  Location: [facility];  Service: Orthopedics;  Laterality: Left;  . LITHOTRIPSY    . TENDON LENGTHENING Bilateral    calf  . TONSILLECTOMY      There were no  vitals filed for this visit.  Subjective Assessment - 06/12/18 1103    Subjective  Wearing prosthesis most of awake hours. Prosthetist is planning to redo socket. Still has pain limiting function.     Patient is accompained by:  Family member    Pertinent History  LTTA, DM2, neuropathy, COPD, HTN, HLD, CHF,    Limitations  Other (comment)    Patient Stated Goals  To use prosthesis to walk in community, fishing, go on vacations     Currently in Pain?  Yes    Pain Score  9     Pain Location  Leg   residual limb    Pain Orientation  Left    Pain Descriptors / Indicators  Aching;Throbbing    Pain Type  Chronic pain    Pain Onset  More than a month ago    Pain Frequency  Constant    Aggravating Factors   wearing prosthesis with mobility    Pain Relieving Factors   removing prosthesis      Prosthetic Training  Residual limb pain 9/10 with standing & gait. He arrived for PT ambulating with prosthesis without assistive device. PT reviewed need to use assistive device to limit weight bearing on prosthesis: Optimally walker but cane at least.  Patient non-verbal communication indicate he is not in agreement with using assistive device. PT also instructed in use of contrast bath & ice massage for pain management. [name] wanted to discuss Knee Walker especially at night to toilet since he fell getting up to toilet without injury. PT, pt & his wife used internet to look at [employer] and recommendation to trial at local store prior to purchasing. Pt & wife verbalized understanding.                          PT Education - 06/12/18 1100    Education Details  contrast bath and ice massage for pain & edema,     Person(s) Educated  Patient;Spouse    Methods  Explanation;Verbal cues    Comprehension  Verbalized understanding       PT Short Term Goals - 06/12/18 1215      PT SHORT TERM GOAL #1   Title  Patient verbalizes proper cleaning & demonstrates donning of prosthesis. (All STGs Target Date: 05/25/2018)    Baseline  MET 06/12/2018    Time  1    Period  Months    Status  Achieved      PT SHORT TERM GOAL #2   Title  Patient tolerates wear >8 hrs total /day without increased skin issues or limb pain >6/10.     Baseline  Partially MET 06/12/2018 for time >90% of awake hours & no skin issues but limb pain 9/10    Time  1    Period  Months    Status  Partially Met      PT SHORT TERM GOAL #3   Title  Patient standing balance without UE support reaching 5", scanning environment and picking up objects from floor with supervision.     Baseline  MET 06/12/2018    Time  1    Period  Months    Status  Achieved      PT SHORT TERM GOAL #4   Title  Patient ambulates 300' with RW & prosthesis with supervision.      Baseline  05/22/18 met.    Time  1    Period  Months    Status  Achieved      PT SHORT TERM GOAL #5   Title  Patient negotiates ramps, curbs with RW & stairs with 1 rail & cane with supervision.     Baseline  Patient verbalizes MET 06/12/2018    Time  1    Period  Months    Status  Achieved        PT Short Term Goals - 06/12/18 1800      PT SHORT TERM GOAL #1   Title  Patient verbalizes wear & weight bearing management with prosthesis to decrease residual limb pain. (All updated STGs Target Date: 06/30/2018)    Time  3    Period  Weeks    Status  New    Target Date  06/30/18      PT SHORT TERM GOAL #2   Title  Berg Balance >/= 40/56    Time  3    Period  Weeks    Status  New    Target Date  06/30/18      PT SHORT TERM GOAL #3   Title  Patient ambulates 300' with cane & prosthesis with supervision with pain increasing <3 increments on 0-10 scale.    Time  3    Period  Weeks    Status  New    Target Date  06/30/18        PT Long Term Goals - 06/12/18 1100      PT LONG TERM GOAL #1   Title  Patient verbalizes & demonstrates proper prosthetic care to enable safe use of prosthesis. (All LTGs Target Date: 07/21/2018)    Time  3    Period  Months    Status  On-going    Target Date  07/21/18      PT LONG TERM GOAL #2   Title  Patient tolerates prosthesis wear >90% of awake hour without skin issues or limb pain >2/10 to enable function throughout his day.     Time  3    Period  Months    Status  On-going    Target Date  07/21/18      PT LONG TERM GOAL #3   Title  Berg Balance >45/56 to indicate lower fall risk.     Time  3    Period  Months    Status  On-going    Target Date  07/21/18      PT LONG TERM GOAL #4   Title  Patient ambulates 300' with LRAD & prosthesis modified independent for community mobility.     Time  3    Period  Months    Status  On-going    Target Date  07/21/18      PT LONG TERM GOAL #5   Title  Patient negotiates ramps, curbs & stairs  with LRAD & prosthesis modified independent for community access.     Time  3    Period  Months    Status  On-going    Target Date  07/21/18      PT LONG TERM GOAL #6   Title  Patient ambulates around furniture with cane or less & prosthesis carrying household items modified independent.    Time  3    Period  Months    Status  On-going    Target Date  07/21/18            Plan - 06/12/18 1215    Clinical Impression Statement  Patient met or partially met STGs except pain with prosthesis wear. Patient is limited by residual limb pain with prosthesis wear, standing & gait. He seems to understand PT recommendations but continues to ambulate without assistive device to limit weight bearing on prosthesis. Prosthetist is planning to make new socket per patient but did not take cast so probably using old model which will not change a lot.     Rehab Potential  Good    PT Frequency  2x / week    PT Duration  Other (comment)   13 weeks (90 days)   PT Treatment/Interventions  ADLs/Self Care Home Management;Canalith Repostioning;Moist Heat;Ultrasound;DME Instruction;Gait training;Stair training;Functional mobility training;Therapeutic activities;Therapeutic exercise;Balance training;Neuromuscular re-education;Patient/family education;Prosthetic Training;Manual techniques;Vestibular;Dry needling    PT Next Visit Plan  check pain issues and how appt with Dr. Sharol Given & [facility], [facility]. work towards updated STGs.     Consulted and Agree with Plan of Care  Patient;Family member/caregiver    Family Member Consulted  wife       Patient will benefit from skilled therapeutic intervention in order to improve the following deficits and impairments:  Abnormal gait, Decreased activity tolerance, Decreased balance, Cardiopulmonary status limiting activity, Decreased endurance, Decreased mobility, Decreased strength, Dizziness, Impaired flexibility, Postural dysfunction, Prosthetic Dependency, Pain  Visit  Diagnosis: Muscle weakness (generalized)  Other abnormalities of gait and mobility  Unsteadiness on feet  Abnormal posture  Repeated falls     Problem List Patient Active Problem List   Diagnosis Date Noted  . Below knee amputation status, left 01/25/2018  . Wound dehiscence, surgical   . Foot amputation status, left 01/03/2018  . Status post left foot surgery 12/15/2017  . Snoring 06/07/2016  . Chest pain 01/22/2016  . Abnormal nuclear stress test 01/22/2016  . COPD (chronic obstructive pulmonary disease) ([place]) 01/27/2011  . Pre-operative cardiovascular examination 01/27/2011  . Nonspecific abnormal electrocardiogram (ECG) (EKG) 01/27/2011  . Murmur 01/27/2011  . DM2 (diabetes mellitus, type 2) ([place])   . HTN (hypertension)   . HLD (hyperlipidemia)   . Tobacco abuse     Benjimin Hadden PT, DPT 06/13/2018, 6:59 AM  [facility] [address] [place], [place], [address] Phone: [phone]   Fax:  [phone]  Name: Troy [name] MRN: [id number] Date of Birth: Sep 10, 1960

## 2018-06-16 ENCOUNTER — Ambulatory Visit: Payer: Medicare HMO | Admitting: Physical Therapy

## 2018-06-19 ENCOUNTER — Ambulatory Visit: Payer: Medicare HMO | Admitting: Physical Therapy

## 2018-06-19 ENCOUNTER — Ambulatory Visit (INDEPENDENT_AMBULATORY_CARE_PROVIDER_SITE_OTHER): Payer: Medicare HMO | Admitting: Orthopedic Surgery

## 2018-06-20 ENCOUNTER — Ambulatory Visit (INDEPENDENT_AMBULATORY_CARE_PROVIDER_SITE_OTHER): Payer: Medicare HMO | Admitting: Orthopedic Surgery

## 2018-06-20 DIAGNOSIS — S88112A Complete traumatic amputation at level between knee and ankle, left lower leg, initial encounter: Secondary | ICD-10-CM | POA: Diagnosis not present

## 2018-06-21 ENCOUNTER — Encounter (INDEPENDENT_AMBULATORY_CARE_PROVIDER_SITE_OTHER): Payer: Self-pay | Admitting: Orthopedic Surgery

## 2018-06-21 NOTE — Progress Notes (Signed)
Office Visit Note   Patient: Troy [name]           Date of Birth: 28-Jul-1960           MRN: [id number] Visit Date: 06/20/2018              Requested by: [name], MD [address] [place], [place] [address] PCP: [name], MD  Chief Complaint  Patient presents with  . Left Leg - Follow-up      HPI: Patient is a 58 year old gentleman who presents complaining of some rubbing and bony prominence on his left transtibial amputation distal tibia.  Assessment & Plan: Visit Diagnoses:  1. Below-knee amputation of left lower extremity ([place])     Plan: Patient has had the prosthesis modified.  He has had increased sweating and recommended he wear the black stump shrinker folded down against the skin to help with the excessive sweating.  Patient states he has tried antiperspirant which has made his sweating worse.  Proper application of the liner was discussed recommended 5 ply sock he is currently wearing a 3 ply.  Follow-Up Instructions: Return if symptoms worsen or fail to improve.   Ortho Exam  Patient is alert, oriented, no adenopathy, well-dressed, normal affect, normal respiratory effort. Examination patient does have some muscle atrophy which has caused some bony prominence.  There are no open ulcers no cellulitis no signs of infection.  No current hemoglobin A1c on file.  Imaging: No results found. No images are attached to the encounter.  Labs: Lab Results  Component Value Date   HGBA1C 11.0 (H) 12/13/2017     Lab Results  Component Value Date   ALBUMIN 3.8 05/09/2011   ALBUMIN 2.7 (L) 08/15/2008   ALBUMIN 2.6 (L) 08/13/2008    There is no height or weight on file to calculate BMI.  Orders:  No orders of the defined types were placed in this encounter.  No orders of the defined types were placed in this encounter.    Procedures: No procedures performed  Clinical Data: No additional findings.  ROS:  All other systems negative, except  as noted in the HPI. Review of Systems  Objective: Vital Signs: There were no vitals taken for this visit.  Specialty Comments:  No specialty comments available.  PMFS History: Patient Active Problem List   Diagnosis Date Noted  . Below knee amputation status, left 01/25/2018  . Wound dehiscence, surgical   . Foot amputation status, left 01/03/2018  . Status post left foot surgery 12/15/2017  . Snoring 06/07/2016  . Chest pain 01/22/2016  . Abnormal nuclear stress test 01/22/2016  . COPD (chronic obstructive pulmonary disease) ([place]) 01/27/2011  . Pre-operative cardiovascular examination 01/27/2011  . Nonspecific abnormal electrocardiogram (ECG) (EKG) 01/27/2011  . Murmur 01/27/2011  . DM2 (diabetes mellitus, type 2) ([place])   . HTN (hypertension)   . HLD (hyperlipidemia)   . Tobacco abuse    Past Medical History:  Diagnosis Date  . Acute renal failure ([place])    in setting of NSAID use and orthopedic surgery 2010  . Anxiety and depression   . Chronic diastolic CHF (congestive heart failure) ([place])    a. Echo 6/17: severe conc LVH, vigorous EF, EF 65-70%, no dynamic obstruction, no RWMA, Gr 1 DD, mild TR  //  b. LHC 8/17: no sig CAD, LVEDP 28  . COPD (chronic obstructive pulmonary disease) ([place])   . Diabetic ulcer of left foot ([place])   . DM2 (diabetes mellitus, type  2) ([place])   . Family history of early CAD   . Fatty liver   . GERD (gastroesophageal reflux disease)   . History of amputation of foot ([place])    L trans-met // R toe  . History of cardiac catheterization    a. [place] 2002: irregs  //  b. LHC in 8/17: no sig CAD, apical DK, hyperdynamic LV, LVEDP 28  . History of kidney stones   . History of nuclear stress test    a. Nuc 7/17: Overall, intermediate risk nuclear stress test secondary to small size of apical lateral defect and reduced ejection fraction.  EF 43%  . HLD (hyperlipidemia)   . HTN (hypertension)   . Injuries     crushing injury to both his feet in February  2010.   Marland Kitchen Kidney calculi   . Palpitations   . Tobacco abuse     Family History  Problem Relation Age of Onset  . Leukemia Mother 27       died  . Lung cancer Father 80       died  . Heart attack Brother 11  . Heart attack Brother 31  . Hypertension Brother        X3  . Hypertension Sister        X52  . Diabetes Sister   . Stroke Sister   . Diabetes Sister   . Other Brother        Musician accident    Past Surgical History:  Procedure Laterality Date  . AMPUTATION Left 01/03/2018   Procedure: LEFT MIDFOOT AMPUTATION/REVISION MIDAMPUTAION;  Surgeon: Mcarthur Rossetti, MD;  Location: [facility];  Service: Orthopedics;  Laterality: Left;  . AMPUTATION Left 01/25/2018   Procedure: LEFT BELOW KNEE AMPUTATION;  Surgeon: [name], MD;  Location: [facility];  Service: Orthopedics;  Laterality: Left;  . BELOW KNEE LEG AMPUTATION Left 01/25/2018  . CARDIAC CATHETERIZATION N/A 01/22/2016   Procedure: Left Heart Cath and Coronary Angiography;  Surgeon: Peter M Martinique, MD;  Location: [facility];  Service: Cardiovascular;  Laterality: N/A;  . FOOT AMPUTATION Bilateral   . I&D EXTREMITY Left 12/15/2017   Procedure: IRRIGATION AND DEBRIDEMENT LEFT FOOT ULCER;  Surgeon: Mcarthur Rossetti, MD;  [facility];  Service: Orthopedics;  Laterality: Left;  . LITHOTRIPSY    . TENDON LENGTHENING Bilateral    calf  . TONSILLECTOMY     Social History   Occupational History  . Occupation: DISABLED  Tobacco Use  . Smoking status: Former Smoker    Packs/day: 0.50    Years: 40.00    Pack years: 20.00    Types: Cigarettes    Last attempt to quit: 01/23/2018    Years since quitting: 0.4  . Smokeless tobacco: Never Used  Substance and Sexual Activity  . Alcohol use: No  . Drug use: No  . Sexual activity: Not on file

## 2018-06-23 ENCOUNTER — Ambulatory Visit: Payer: Medicare HMO | Attending: Physician Assistant | Admitting: Physical Therapy

## 2018-06-23 DIAGNOSIS — M6281 Muscle weakness (generalized): Secondary | ICD-10-CM | POA: Insufficient documentation

## 2018-06-23 DIAGNOSIS — R2681 Unsteadiness on feet: Secondary | ICD-10-CM | POA: Insufficient documentation

## 2018-06-23 DIAGNOSIS — R2689 Other abnormalities of gait and mobility: Secondary | ICD-10-CM | POA: Insufficient documentation

## 2018-06-26 ENCOUNTER — Ambulatory Visit: Payer: Medicare HMO | Admitting: Physical Therapy

## 2018-06-30 ENCOUNTER — Ambulatory Visit: Payer: Medicare HMO | Admitting: Physical Therapy

## 2018-06-30 ENCOUNTER — Encounter: Payer: Self-pay | Admitting: Physical Therapy

## 2018-06-30 DIAGNOSIS — R2681 Unsteadiness on feet: Secondary | ICD-10-CM | POA: Diagnosis present

## 2018-06-30 DIAGNOSIS — M6281 Muscle weakness (generalized): Secondary | ICD-10-CM

## 2018-06-30 DIAGNOSIS — R2689 Other abnormalities of gait and mobility: Secondary | ICD-10-CM

## 2018-07-01 ENCOUNTER — Other Ambulatory Visit: Payer: Self-pay | Admitting: Internal Medicine

## 2018-07-01 DIAGNOSIS — I10 Essential (primary) hypertension: Secondary | ICD-10-CM

## 2018-07-01 NOTE — Therapy (Addendum)
[facility] [address] [place], [place], [address] Phone: [phone]   Fax:  [phone]  Physical Therapy Treatment  Patient Details  Name: Troy [name] MRN: [id number] Date of Birth: 1960-09-25 Referring Provider (PT): Erlinda Hong, [place]   Encounter Date: 06/30/2018   Progress Note Reporting Period 04/25/2018 to 06/30/2018  See note below for Objective Data and Assessment of Progress/Goals.   Jamey Reas, PT, DPT PT Specializing in [place] 07/04/18 8:41 AM Phone:  [phone]  Fax:  [phone] [facility] [address] [place], [place] [address]      PT End of Session - 07/01/18 1819    Visit Number  10    Number of Visits  26    Date for PT Re-Evaluation  07/21/18    Authorization Type  AETNA Medicare    Authorization Time Period  $40 co-pay,  $4200oop, $2363.66 remaining prior to PT eval    PT Start Time  1103    PT Stop Time  1145    PT Time Calculation (min)  42 min    Equipment Utilized During Treatment  Gait belt    Activity Tolerance  Patient tolerated treatment well;Patient limited by pain    Behavior During Therapy  WFL for tasks assessed/performed       Past Medical History:  Diagnosis Date  . Acute renal failure ([place])    in setting of NSAID use and orthopedic surgery 2010  . Anxiety and depression   . Chronic diastolic CHF (congestive heart failure) ([place])    a. Echo 6/17: severe conc LVH, vigorous EF, EF 65-70%, no dynamic obstruction, no RWMA, Gr 1 DD, mild TR  //  b. LHC 8/17: no sig CAD, LVEDP 28  . COPD (chronic obstructive pulmonary disease) ([place])   . Diabetic ulcer of left foot ([place])   . DM2 (diabetes mellitus, type 2) ([place])   . Family history of early CAD   . Fatty liver   . GERD (gastroesophageal reflux disease)   . History of amputation of foot ([place])    L trans-met // R toe  . History of cardiac catheterization    a. [place] 2002:  irregs  //  b. LHC in 8/17: no sig CAD, apical DK, hyperdynamic LV, LVEDP 28  . History of kidney stones   . History of nuclear stress test    a. Nuc 7/17: Overall, intermediate risk nuclear stress test secondary to small size of apical lateral defect and reduced ejection fraction.  EF 43%  . HLD (hyperlipidemia)   . HTN (hypertension)   . Injuries     crushing injury to both his feet in February 2010.   Marland Kitchen Kidney calculi   . Palpitations   . Tobacco abuse     Past Surgical History:  Procedure Laterality Date  . AMPUTATION Left 01/03/2018   Procedure: LEFT MIDFOOT AMPUTATION/REVISION MIDAMPUTAION;  Surgeon: Mcarthur Rossetti, MD;  Location: [facility];  Service: Orthopedics;  Laterality: Left;  . AMPUTATION Left 01/25/2018   Procedure: LEFT BELOW KNEE AMPUTATION;  Surgeon: [name], MD;  Location: [facility];  Service: Orthopedics;  Laterality: Left;  . BELOW KNEE LEG AMPUTATION Left 01/25/2018  . CARDIAC CATHETERIZATION N/A 01/22/2016   Procedure: Left Heart Cath and Coronary Angiography;  Surgeon: Peter M Martinique, MD;  Location: [facility];  Service: Cardiovascular;  Laterality: N/A;  . FOOT AMPUTATION Bilateral   . I&D EXTREMITY Left 12/15/2017  Procedure: IRRIGATION AND DEBRIDEMENT LEFT FOOT ULCER;  Surgeon: Mcarthur Rossetti, MD;  [facility];  Service: Orthopedics;  Laterality: Left;  . LITHOTRIPSY    . TENDON LENGTHENING Bilateral    calf  . TONSILLECTOMY      There were no vitals filed for this visit.  Subjective Assessment - 06/30/18 1105    Subjective  Reports the pain is getting better. Has been released by Dr. Sharol Given. Was told to see Mortimer Fries to have a window cut into socket to off load the tibial crest area. Pt also requesting to transfer to [place] as it is 5 minutes from his home and would be easier to get too.     Patient is accompained by:  Family member    Pertinent History  LTTA, DM2, neuropathy, COPD, HTN, HLD, CHF,    Limitations  Other (comment)     Patient Stated Goals  To use prosthesis to walk in community, fishing, go on vacations     Currently in Pain?  Yes    Pain Score  6     Pain Location  Leg    Pain Orientation  Left    Pain Descriptors / Indicators  Aching    Pain Type  Chronic pain    Pain Onset  More than a month ago    Pain Frequency  Constant    Aggravating Factors   wearing prosthesis and mobility    Pain Relieving Factors  removing prosthesis         [facility] PT Assessment - 06/30/18 1116      Berg Balance Test   Sit to Stand  Able to stand without using hands and stabilize independently    Standing Unsupported  Able to stand safely 2 minutes    Sitting with Back Unsupported but Feet Supported on Floor or Stool  Able to sit safely and securely 2 minutes    Stand to Sit  Sits safely with minimal use of hands    Transfers  Able to transfer safely, minor use of hands    Standing Unsupported with Eyes Closed  Able to stand 10 seconds safely    Standing Ubsupported with Feet Together  Able to place feet together independently and stand 1 minute safely    From Standing, Reach Forward with Outstretched Arm  Can reach confidently >25 cm (10")    From Standing Position, Pick up Object from Floor  Able to pick up shoe safely and easily    From Standing Position, Turn to Look Behind Over each Shoulder  Looks behind one side only/other side shows less weight shift   right > left side   Turn 360 Degrees  Able to turn 360 degrees safely but slowly   5-6 sec's both ways   Standing Unsupported, Alternately Place Feet on Step/Stool  Able to complete 4 steps without aid or supervision    Standing Unsupported, One Foot in Front  Able to take small step independently and hold 30 seconds    Standing on One Leg  Tries to lift leg/unable to hold 3 seconds but remains standing independently    Total Score  46    Berg comment:  46/56= moderate                    [facility] Adult PT Treatment/Exercise - 06/30/18 1116       Transfers   Transfers  Sit to Stand;Stand to Sit    Sit to Stand  6: Modified independent (  Device/Increase time)    Stand to Sit  6: Modified independent (Device/Increase time)      Ambulation/Gait   Ambulation/Gait  Yes    Ambulation/Gait Assistance  5: Supervision    Ambulation/Gait Assistance Details  no AD, prosthesis only. pt noted to be longer on prosthetic side. added heel wedge to other shoe with improvement noted. Pt to have addressed at apt on Monday with prosthetist. no balance issues noted with any gait.     Ambulation Distance (Feet)  120 Feet   x1, 360 x1   Assistive device  Prosthesis    Gait Pattern  Step-through pattern;Decreased stance time - left;Decreased stride length;Decreased weight shift to left;Antalgic;Trunk flexed    Ambulation Surface  Level;Indoor      Prosthetics   Current prosthetic wear tolerance (days/week)   >/=80% of awake hours    Current prosthetic wear tolerance (#hours/day)   daily    Residual limb condition   intact with no issues. some redness noted from heat. no open areas, scabs have fallen off.    Donning Prosthesis  Supervision    Doffing Prosthesis  Modified independent (device/increased time)               PT Short Term Goals - 06/30/18 1116      PT SHORT TERM GOAL #1   Title  Patient verbalizes wear & weight bearing management with prosthesis to decrease residual limb pain. (All updated STGs Target Date: 06/30/2018)    Baseline  06/30/18: using ice and managing socks as needed. plans to schedule to see Mortimer Fries for further adjustments    Status  Achieved      PT SHORT TERM GOAL #2   Title  Berg Balance >/= 40/56    Baseline  06/30/18: 46/56 scored today    Time  --    Period  --    Status  Achieved      PT SHORT TERM GOAL #3   Title  Patient ambulates 300' with cane & prosthesis with supervision with pain increasing <3 increments on 0-10 scale.    Baseline  06/30/18: met today with no AD, prosthesis only, with pain level  increase of 1 increment that resolved to baseline with rest break.    Time  --    Period  --    Status  Achieved        PT Long Term Goals - 06/12/18 1100      PT LONG TERM GOAL #1   Title  Patient verbalizes & demonstrates proper prosthetic care to enable safe use of prosthesis. (All LTGs Target Date: 07/21/2018)    Time  3    Period  Months    Status  On-going    Target Date  07/21/18      PT LONG TERM GOAL #2   Title  Patient tolerates prosthesis wear >90% of awake hour without skin issues or limb pain >2/10 to enable function throughout his day.     Time  3    Period  Months    Status  On-going    Target Date  07/21/18      PT LONG TERM GOAL #3   Title  Berg Balance >45/56 to indicate lower fall risk.     Time  3    Period  Months    Status  On-going    Target Date  07/21/18      PT LONG TERM GOAL #4   Title  Patient ambulates 300' with LRAD &  prosthesis modified independent for community mobility.     Time  3    Period  Months    Status  On-going    Target Date  07/21/18      PT LONG TERM GOAL #5   Title  Patient negotiates ramps, curbs & stairs with LRAD & prosthesis modified independent for community access.     Time  3    Period  Months    Status  On-going    Target Date  07/21/18      PT LONG TERM GOAL #6   Title  Patient ambulates around furniture with cane or less & prosthesis carrying household items modified independent.    Time  3    Period  Months    Status  On-going    Target Date  07/21/18            Plan - 06/30/18 1818    Clinical Impression Statement  Pt has missed the past several visits due to issues with getting here, asking to transfer to [facility] after today. Today's skilled session addressed STGs wtih all goals met. The pt does continue to have pain, has appt on Monday with prosthetist to continue to address pain and have height adjusted. The pt should benefit from continued PT to progress towrad unmet goals.     Rehab Potential   Good    PT Frequency  2x / week    PT Duration  Other (comment)   13 weeks (90 days)   PT Treatment/Interventions  ADLs/Self Care Home Management;Canalith Repostioning;Moist Heat;Ultrasound;DME Instruction;Gait training;Stair training;Functional mobility training;Therapeutic activities;Therapeutic exercise;Balance training;Neuromuscular re-education;Patient/family education;Prosthetic Training;Manual techniques;Vestibular;Dry needling    PT Next Visit Plan  continue toward LTGs.    Consulted and Agree with Plan of Care  Patient;Family member/caregiver    Family Member Consulted  wife       Patient will benefit from skilled therapeutic intervention in order to improve the following deficits and impairments:  Abnormal gait, Decreased activity tolerance, Decreased balance, Cardiopulmonary status limiting activity, Decreased endurance, Decreased mobility, Decreased strength, Dizziness, Impaired flexibility, Postural dysfunction, Prosthetic Dependency, Pain  Visit Diagnosis: Muscle weakness (generalized)  Other abnormalities of gait and mobility  Unsteadiness on feet     Problem List Patient Active Problem List   Diagnosis Date Noted  . Below knee amputation status, left 01/25/2018  . Wound dehiscence, surgical   . Foot amputation status, left 01/03/2018  . Status post left foot surgery 12/15/2017  . Snoring 06/07/2016  . Chest pain 01/22/2016  . Abnormal nuclear stress test 01/22/2016  . COPD (chronic obstructive pulmonary disease) ([place]) 01/27/2011  . Pre-operative cardiovascular examination 01/27/2011  . Nonspecific abnormal electrocardiogram (ECG) (EKG) 01/27/2011  . Murmur 01/27/2011  . DM2 (diabetes mellitus, type 2) ([place])   . HTN (hypertension)   . HLD (hyperlipidemia)   . Tobacco abuse     [place], [place], [facility] [address], [place], [place] [address] [phone] 07/01/18, 6:32 PM   Name: NEALE MARZETTE MRN: [id number] Date of  Birth: 27-Aug-1960

## 2018-07-03 ENCOUNTER — Ambulatory Visit: Payer: Medicare HMO | Admitting: Physical Therapy

## 2018-07-05 ENCOUNTER — Ambulatory Visit: Payer: Medicare HMO | Admitting: Physical Therapy

## 2018-07-05 ENCOUNTER — Encounter: Payer: Medicare HMO | Admitting: Physical Therapy

## 2018-07-10 ENCOUNTER — Encounter: Payer: Medicare HMO | Admitting: Physical Therapy

## 2018-07-14 ENCOUNTER — Encounter: Payer: Medicare HMO | Admitting: Rehabilitation

## 2018-07-17 ENCOUNTER — Encounter: Payer: Medicare HMO | Admitting: Physical Therapy

## 2018-07-19 ENCOUNTER — Encounter: Payer: Medicare HMO | Admitting: Physical Therapy

## 2018-07-20 ENCOUNTER — Encounter: Payer: Self-pay | Admitting: Physician Assistant

## 2018-07-20 ENCOUNTER — Ambulatory Visit: Payer: Medicare HMO | Admitting: Physician Assistant

## 2018-07-20 VITALS — BP 138/72 | HR 109 | Ht 71.0 in | Wt 239.0 lb

## 2018-07-20 DIAGNOSIS — R011 Cardiac murmur, unspecified: Secondary | ICD-10-CM | POA: Diagnosis not present

## 2018-07-20 DIAGNOSIS — I5032 Chronic diastolic (congestive) heart failure: Secondary | ICD-10-CM

## 2018-07-20 DIAGNOSIS — J449 Chronic obstructive pulmonary disease, unspecified: Secondary | ICD-10-CM | POA: Diagnosis not present

## 2018-07-20 DIAGNOSIS — R000 Tachycardia, unspecified: Secondary | ICD-10-CM

## 2018-07-20 DIAGNOSIS — Z720 Tobacco use: Secondary | ICD-10-CM

## 2018-07-20 DIAGNOSIS — I10 Essential (primary) hypertension: Secondary | ICD-10-CM | POA: Diagnosis not present

## 2018-07-20 MED ORDER — METOPROLOL SUCCINATE ER 50 MG PO TB24: 50.0000 mg | ORAL_TABLET | Freq: Two times a day (BID) | ORAL | 3 refills | Status: DC

## 2018-07-20 MED ORDER — POTASSIUM CHLORIDE ER 8 MEQ PO TBCR: 8.0000 meq | EXTENDED_RELEASE_TABLET | Freq: Every day | ORAL | 3 refills | Status: DC | PRN

## 2018-07-20 MED ORDER — FUROSEMIDE 80 MG PO TABS: 40.0000 mg | ORAL_TABLET | Freq: Every day | ORAL | 3 refills | Status: DC

## 2018-07-20 NOTE — Progress Notes (Signed)
Cardiology Office Note    Date:  07/20/2018   ID:  [name], DOB 05-Nov-1960, MRN [id number]  PCP:  [name], MD  Cardiologist: Dr. [name]  Chief Complaint: 15 Months follow up  History of Present Illness:   Troy [name] is a 58 y.o. male with a history of diastolic CHF, DM, HL, HTN. S/p BKA of LLE and COPD with ongoing tobacco smoking presents for follow up.   Cath in 2002 normal LVEF Luminal irreg. Echo 11/2015 showed normal LVEF with grade 1 DD. Most recent cath in August 2017 with minimal CAD but elevated LVEDP (this was done for intermediate stress test). 24 hours holter 01/2017 without significant arrhythmias.   He was othostatic when last seen by Dr. [name] 04/2017. Cut back lasix to 67m daily.   Here today for follow up. He take lasix and Kdur only as needed randomly. He is not taking toprol XL 1064mAM and 5068mM for past few months. Noted elevated HR at times. HR of 107 today in clinic. Limited ambulation due to prosthesis. No chest pain, dyspnea, LE edema. Personally reviewed [place] study in past.    Past Medical History:  Diagnosis Date  . Acute renal failure ([facility]  in setting of NSAID use and orthopedic surgery 2010  . Anxiety and depression   . Chronic diastolic CHF (congestive heart failure) ([facility]  a. Echo 6/17: severe conc LVH, vigorous EF, EF 65-70%, no dynamic obstruction, no RWMA, Gr 1 DD, mild TR  //  b. LHC 8/17: no sig CAD, LVEDP 28  . COPD (chronic obstructive pulmonary disease) ([facility] . Diabetic ulcer of left foot ([facility] . DM2 (diabetes mellitus, type 2) ([facility] . Family history of early CAD   . Fatty liver   . GERD (gastroesophageal reflux disease)   . History of amputation of foot ([facility]  L trans-met // R toe  . History of cardiac catheterization    a. [place]: irregs  //  b. LHC in 8/17: no sig CAD, apical DK, hyperdynamic LV, LVEDP 28  . History of kidney  stones   . History of nuclear stress test    a. Nuc 7/17: Overall, intermediate risk nuclear stress test secondary to small size of apical lateral defect and reduced ejection fraction.  EF 43%  . HLD (hyperlipidemia)   . HTN (hypertension)   . Injuries     crushing injury to both his feet in February 2010.   . KMarland Kitchendney calculi   . Palpitations   . Tobacco abuse     Past Surgical History:  Procedure Laterality Date  . AMPUTATION Left 01/03/2018   Procedure: LEFT MIDFOOT AMPUTATION/REVISION MIDAMPUTAION;  Surgeon: BlaMcarthur RossettiD;  [facility];  Laterality: Left;  . AMPUTATION Left 01/25/2018   Procedure: LEFT BELOW KNEE AMPUTATION;  Surgeon: DudNewt MinionD;  [facility];  Laterality: Left;  . BELOW KNEE LEG AMPUTATION Left 01/25/2018  . CARDIAC CATHETERIZATION N/A 01/22/2016   Procedure: Left Heart Cath and Coronary Angiography;  Surgeon: Peter M JorMartiniqueD;  [facility];  Service: Cardiovascular;  Laterality: N/A;  . FOOT AMPUTATION Bilateral   . I&D EXTREMITY Left 12/15/2017   Procedure: IRRIGATION AND DEBRIDEMENT LEFT FOOT ULCER;  Surgeon: BlaMcarthur RossettiD;  [facility];  Service: Orthopedics;  Laterality: Left;  . LITHOTRIPSY    . TENDON LENGTHENING Bilateral    calf  . TONSILLECTOMY      Current Medications: Prior to Admission medications   Medication Sig Start Date End Date Taking? Authorizing Provider  albuterol (PROVENTIL HFA;VENTOLIN HFA) 108 (90 Base) MCG/ACT inhaler Inhale 2 puffs into the lungs every 6 (six) hours as needed for wheezing or shortness of breath. 10/11/16   Collene Gobble, MD  amitriptyline (ELAVIL) 150 MG tablet Take 150 mg by mouth at bedtime.      [provider]  aspirin EC 81 MG tablet Take 1 tablet (81 mg total) by mouth daily. 11/28/15   Fay Records, MD  dapagliflozin propanediol (FARXIGA) 10 MG TABS tablet Take 10 mg by mouth daily.    [provider]  Dulaglutide 1.5 MG/0.5ML SOPN Inject 1.5 mg into the skin every Wednesday.  11/10/16   [provider]  furosemide (LASIX) 80 MG tablet Take 0.5 tablets (40 mg total) by mouth daily. Please make overdue appt with Dr. [name] before anymore refills. 1st attempt 05/26/18   Fay Records, MD  glipiZIDE (GLUCOTROL XL) 10 MG 24 hr tablet Take 10 mg by mouth daily with breakfast.    [provider]  ipratropium (ATROVENT) 0.02 % nebulizer solution Take 2.5 mLs (0.5 mg total) by nebulization 4 (four) times daily. Patient taking differently: Take 0.5 mg by nebulization 4 (four) times daily as needed for wheezing or shortness of breath.  11/10/16   Parrett, Tammy S, NP  LORazepam (ATIVAN) 1 MG tablet Take 1 mg by mouth at bedtime.     [provider]  losartan (COZAAR) 50 MG tablet Take 1 tablet (50 mg total) by mouth daily. 07/03/18   Fay Records, MD  metFORMIN (GLUCOPHAGE-XR) 500 MG 24 hr tablet Take 1,000 mg by mouth 2 (two) times daily.     [provider]  metoprolol succinate (TOPROL-XL) 100 MG 24 hr tablet Take 100 mg every AM and 50 mg every PM. Take with or immediately following a meal. Patient taking differently: Take 50-100 mg by mouth See admin instructions. Take 100 mg every AM and 50 mg every PM. Take with or immediately following a meal. 03/24/17   Fay Records, MD  omeprazole (PRILOSEC) 40 MG capsule Take 40 mg by mouth daily.    [provider]  oxyCODONE-acetaminophen (PERCOCET/ROXICET) 5-325 MG tablet Take 1 tablet by mouth every 4 (four) hours as needed for severe pain. 01/31/18   [name], MD  potassium chloride (KLOR-CON) 8 MEQ tablet Take 8 mEq by mouth daily.    [provider]  simvastatin (ZOCOR) 40 MG tablet Take 40 mg by mouth every evening.     [provider]  tiZANidine (ZANAFLEX) 4 MG capsule Take 12 mg by mouth at bedtime.     [provider]  traZODone (DESYREL) 50 MG tablet Take 50 mg by  mouth at bedtime.     [provider]    Allergies:   Hydromorphone hcl er; Tapentadol; and Exalamide   Social History   Socioeconomic History  . Marital status: Married    Spouse name: Not on file  . Number of children: 1  . Years of education: Not on file  . Highest education level: Not on file  Occupational History  . Occupation: DISABLED  Social Needs  . Financial resource strain: Not hard at all  . Food insecurity:  Worry: Never true    Inability: Never true  . Transportation needs:    Medical: No    Non-medical: No  Tobacco Use  . Smoking status: Former Smoker    Packs/day: 0.50    Years: 40.00    Pack years: 20.00    Types: Cigarettes    Last attempt to quit: 01/23/2018    Years since quitting: 0.4  . Smokeless tobacco: Never Used  Substance and Sexual Activity  . Alcohol use: No  . Drug use: No  . Sexual activity: Not on file  Lifestyle  . Physical activity:    Days per week: Not on file    Minutes per session: Not on file  . Stress: Not on file  Relationships  . Social connections:    Talks on phone: Not on file    Gets together: Not on file    Attends religious service: Not on file    Active member of club or organization: Not on file    Attends meetings of clubs or organizations: Not on file    Relationship status: Not on file  Other Topics Concern  . Not on file  Social History Narrative  . Not on file     Family History:  The patient's family history includes Diabetes in his sister and sister; Heart attack (age of onset: 11) in his brother; Heart attack (age of onset: 51) in his brother; Hypertension in his brother and sister; Leukemia (age of onset: 57) in his mother; Lung cancer (age of onset: 46) in his father; Other in his brother; Stroke in his sister.  ROS:   Please see the history of present illness.    ROS All other systems reviewed and are negative.   PHYSICAL EXAM:   VS:  BP 138/72   Pulse (!) 109   Ht _0  (1.803 m)    Wt 239 lb (108.4 kg)   SpO2 96%   BMI 33.33 kg/m    GEN: Well nourished, well developed, in no acute distress  HEENT: normal  Neck: no JVD, carotid bruits, or masses Cardiac: RRR; no murmurs, rubs, or gallops,no edema  Respiratory:  clear to auscultation bilaterally, normal work of breathing GI: soft, nontender, nondistended, + BS TG:GYIR leg prosthesis  Skin: warm and dry, no rash Neuro:  Alert and Oriented x 3, Strength and sensation are intact Psych: euthymic mood, full affect  Wt Readings from Last 3 Encounters:  07/20/18 239 lb (108.4 kg)  04/18/18 225 lb (102.1 kg)  03/28/18 225 lb (102.1 kg)      Studies/Labs Reviewed:   EKG:  EKG is ordered today.  The ekg ordered today demonstrates sinus tachycardia at rate of 107 bpm with PAC, TWI in inferior lateral leads chronically  Recent Labs: 01/25/2018: BUN 7; Creatinine, Ser 0.57; Hemoglobin 12.8; Platelets 169; Potassium 4.0; Sodium 136   Lipid Panel    Component Value Date/Time   CHOL 124 08/27/2016 0845   TRIG 168 (H) 08/27/2016 0845   HDL 33 (L) 08/27/2016 0845   CHOLHDL 3.8 08/27/2016 0845   LDLCALC 57 08/27/2016 0845    Additional studies/ records that were reviewed today include:   as summarized above  ASSESSMENT & PLAN:    1. Chronic diastolic CHF - Euvolemic. Use lasix PRN.   2. HTN - Stable on losartan. Add metoprolol as below.   3. Palpitation/sinus tachycardia - Prior monitor as above. Restart Toprolol XL at lower dose 3m BID. He will monitor his HR and symptoms  at home. If still issue, will call us for monitor setup. Will go to ER if sustained tachycardia.   4. DM - Per PCP  5. Tobacco smoking - Smokes 1 pack/day. Not interested in quit.     Medication Adjustments/Labs and Tests Ordered: Current medicines are reviewed at length with the patient today.  Concerns regarding medicines are outlined above.  Medication changes, Labs and Tests ordered today are listed in the Patient Instructions  below. Patient Instructions  Medication Instructions:  Your physician has recommended you make the following change in your medication:  1.  REDUCE the Metoprolol to 50 mg taking 1 tablet twice a day   If you need a refill on your cardiac medications before your next appointment, please call your pharmacy.   Lab work: None ordered  If you have labs (blood work) drawn today and your tests are completely normal, you will receive your results only by: Marland Kitchen MyChart Message (if you have MyChart) OR . A paper copy in the mail If you have any lab test that is abnormal or we need to change your treatment, we will call you to review the results.  Testing/Procedures: None ordered  Follow-Up: At [facility], you and your health needs are our priority.  As part of our continuing mission to provide you with exceptional heart care, we have created designated Provider Care Teams.  These Care Teams include your primary Cardiologist (physician) and Advanced Practice Providers (APPs -  Physician Assistants and Nurse Practitioners) who all work together to provide you with the care you need, when you need it. You will need a follow up appointment in:  6 months.  Please call our office 2 months in advance to schedule this appointment.  You may see No primary care provider on file. or one of the following Advanced Practice Providers on your designated Care Team: Richardson Dopp, PA-C [place], [place] . Daune Perch, NP  Any Other Special Instructions Will Be Listed Below (If Applicable).       Jarrett Soho, [place]  07/20/2018 11:40 AM    [address], [place], [place]  [address] Phone: [phone]; Fax: [phone]

## 2018-07-20 NOTE — Patient Instructions (Signed)
Medication Instructions:  Your physician has recommended you make the following change in your medication:  1.  REDUCE the Metoprolol to 50 mg taking 1 tablet twice a day   If you need a refill on your cardiac medications before your next appointment, please call your pharmacy.   Lab work: None ordered  If you have labs (blood work) drawn today and your tests are completely normal, you will receive your results only by: Marland Kitchen MyChart Message (if you have MyChart) OR . A paper copy in the mail If you have any lab test that is abnormal or we need to change your treatment, we will call you to review the results.  Testing/Procedures: None ordered  Follow-Up: At [facility], you and your health needs are our priority.  As part of our continuing mission to provide you with exceptional heart care, we have created designated Provider Care Teams.  These Care Teams include your primary Cardiologist (physician) and Advanced Practice Providers (APPs -  Physician Assistants and Nurse Practitioners) who all work together to provide you with the care you need, when you need it. You will need a follow up appointment in:  6 months.  Please call our office 2 months in advance to schedule this appointment.  You may see No primary care provider on file. or one of the following Advanced Practice Providers on your designated Care Team: Richardson Dopp, PA-C [place], [place] . Daune Perch, NP  Any Other Special Instructions Will Be Listed Below (If Applicable).

## 2018-09-03 IMAGING — DX DG CHEST 2V
2 series · 2 of 2 positions shown · non-contrast
Comparison: 05/09/2011.

CLINICAL DATA: Hypertension.

EXAM:
CHEST  2 VIEW

[chest pa]
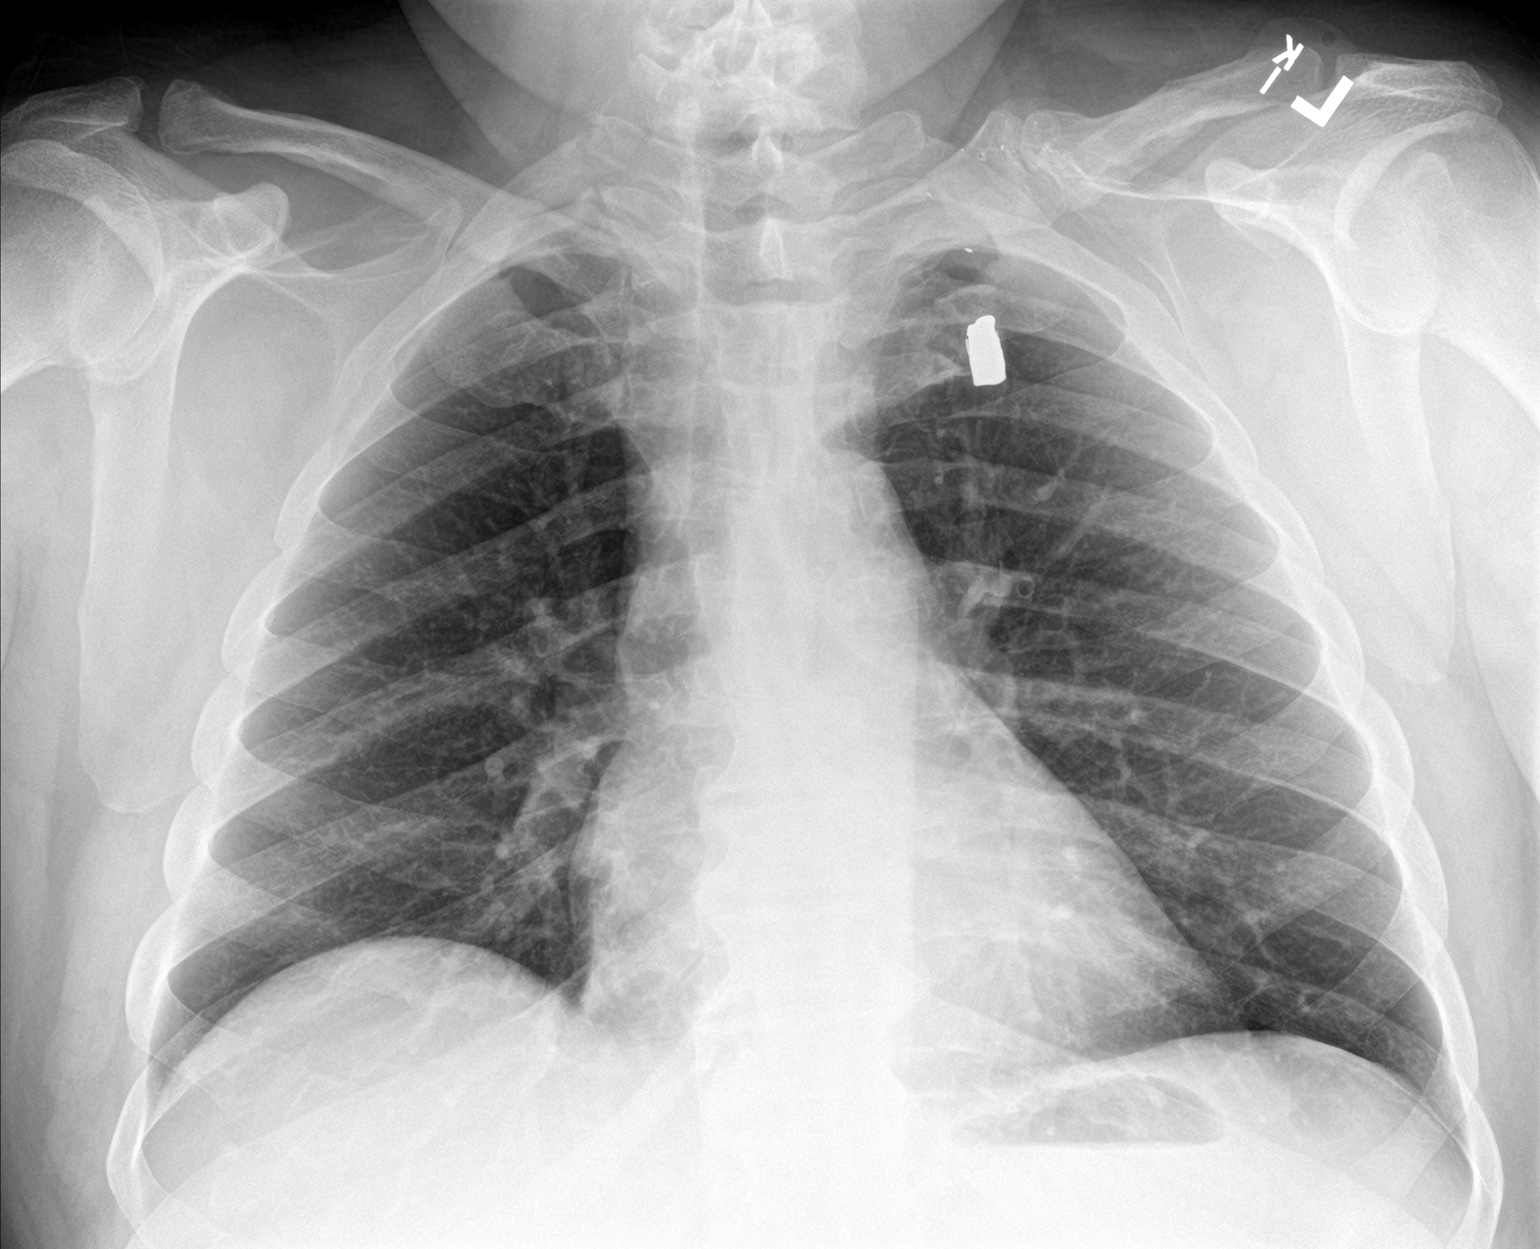

[chest lat]
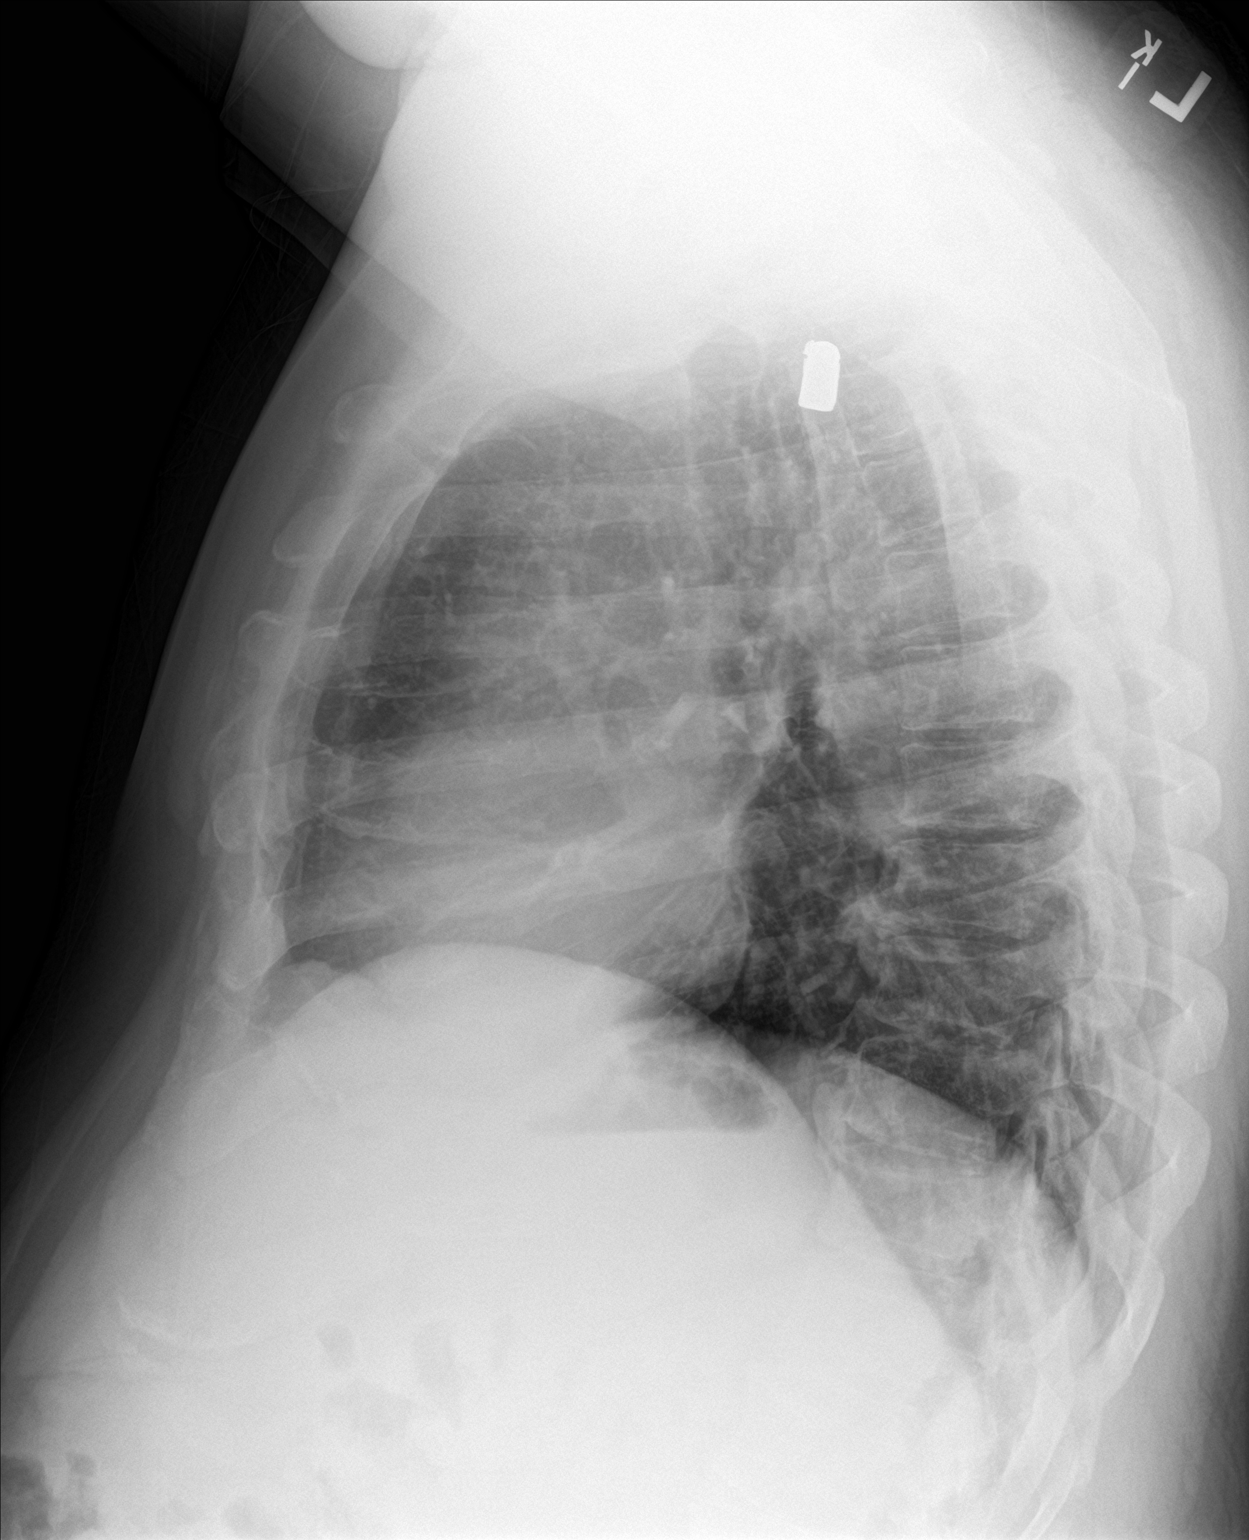

[2 of 2 positions shown; findings below may reference images not displayed]

FINDINGS: Mediastinum and hilar structures are normal. Metallic fragments
again noted over the left upper chest. Heart size stable. No focal
infiltrate. No pleural effusion or pneumothorax. Old left clavicular
nonunited fracture.
IMPRESSION: Stable chest.  No acute cardiopulmonary disease.

## 2018-09-11 ENCOUNTER — Encounter: Payer: Self-pay | Admitting: Physical Therapy

## 2018-09-11 NOTE — Therapy (Signed)
[facility] [address] [place], [place], [address] Phone: [phone]   Fax:  [phone]  Patient Details  Name: Troy [name] MRN: [id number] Date of Birth: 10-23-1960 Referring Provider:  Erlinda Hong, PA  Encounter Date: 09/11/2018  PHYSICAL THERAPY DISCHARGE SUMMARY  Visits from Start of Care: 10  Current functional level related to goals / functional outcomes: See last PT noted on 06/30/2018   Remaining deficits: See last PT noted on 06/30/2018   Education / Equipment: Prosthetic care  Plan: Patient agrees to discharge.  Patient goals were not met. Patient is being discharged due to not returning since the last visit.  ?????         Teela Narducci PT, DPT 09/11/2018, 3:27 PM  [facility] [address] [place], [place], [address] Phone: [phone]   Fax:  [phone]

## 2018-10-22 ENCOUNTER — Emergency Department (HOSPITAL_COMMUNITY): Payer: Medicare HMO

## 2018-10-22 ENCOUNTER — Other Ambulatory Visit: Payer: Self-pay

## 2018-10-22 ENCOUNTER — Emergency Department (HOSPITAL_COMMUNITY)
Admission: EM | Admit: 2018-10-22 | Discharge: 2018-10-23 | Disposition: A | Discharge status: Home / Self Care | Payer: Medicare HMO | Attending: Emergency Medicine | Admitting: Emergency Medicine

## 2018-10-22 DIAGNOSIS — Z89512 Acquired absence of left leg below knee: Secondary | ICD-10-CM | POA: Insufficient documentation

## 2018-10-22 DIAGNOSIS — I110 Hypertensive heart disease with heart failure: Secondary | ICD-10-CM | POA: Diagnosis not present

## 2018-10-22 DIAGNOSIS — R000 Tachycardia, unspecified: Secondary | ICD-10-CM | POA: Insufficient documentation

## 2018-10-22 DIAGNOSIS — E119 Type 2 diabetes mellitus without complications: Secondary | ICD-10-CM | POA: Insufficient documentation

## 2018-10-22 DIAGNOSIS — N4889 Other specified disorders of penis: Secondary | ICD-10-CM | POA: Diagnosis not present

## 2018-10-22 DIAGNOSIS — I5032 Chronic diastolic (congestive) heart failure: Secondary | ICD-10-CM | POA: Insufficient documentation

## 2018-10-22 DIAGNOSIS — Z7984 Long term (current) use of oral hypoglycemic drugs: Secondary | ICD-10-CM | POA: Diagnosis not present

## 2018-10-22 DIAGNOSIS — Z87891 Personal history of nicotine dependence: Secondary | ICD-10-CM | POA: Insufficient documentation

## 2018-10-22 DIAGNOSIS — J449 Chronic obstructive pulmonary disease, unspecified: Secondary | ICD-10-CM | POA: Diagnosis not present

## 2018-10-22 DIAGNOSIS — Z79899 Other long term (current) drug therapy: Secondary | ICD-10-CM | POA: Diagnosis not present

## 2018-10-22 DIAGNOSIS — Z7982 Long term (current) use of aspirin: Secondary | ICD-10-CM | POA: Insufficient documentation

## 2018-10-22 LAB — CBC WITH DIFFERENTIAL/PLATELET
Abs Immature Granulocytes: 0.04 10*3/uL (ref 0.00–0.07)
Basophils Absolute: 0.1 10*3/uL (ref 0.0–0.1)
Basophils Relative: 1 %
Eosinophils Absolute: 0.4 10*3/uL (ref 0.0–0.5)
Eosinophils Relative: 3 %
HCT: 46.7 % (ref 39.0–52.0)
Hemoglobin: 15.5 g/dL (ref 13.0–17.0)
Immature Granulocytes: 0 %
Lymphocytes Relative: 23 %
Lymphs Abs: 2.8 10*3/uL (ref 0.7–4.0)
MCH: 28.2 pg (ref 26.0–34.0)
MCHC: 33.2 g/dL (ref 30.0–36.0)
MCV: 84.9 fL (ref 80.0–100.0)
Monocytes Absolute: 1 10*3/uL (ref 0.1–1.0)
Monocytes Relative: 8 %
Neutro Abs: 7.8 10*3/uL — ABNORMAL HIGH (ref 1.7–7.7)
Neutrophils Relative %: 65 %
Platelets: 193 10*3/uL (ref 150–400)
RBC: 5.5 MIL/uL (ref 4.22–5.81)
RDW: 13.4 % (ref 11.5–15.5)
WBC: 12 10*3/uL — ABNORMAL HIGH (ref 4.0–10.5)
nRBC: 0 % (ref 0.0–0.2)

## 2018-10-22 LAB — COMPREHENSIVE METABOLIC PANEL
ALT: 22 U/L (ref 0–44)
AST: 30 U/L (ref 15–41)
Albumin: 4.2 g/dL (ref 3.5–5.0)
Alkaline Phosphatase: 98 U/L (ref 38–126)
Anion gap: 16 — ABNORMAL HIGH (ref 5–15)
BUN: 12 mg/dL (ref 6–20)
CO2: 20 mmol/L — ABNORMAL LOW (ref 22–32)
Calcium: 9.2 mg/dL (ref 8.9–10.3)
Chloride: 94 mmol/L — ABNORMAL LOW (ref 98–111)
Creatinine, Ser: 0.82 mg/dL (ref 0.61–1.24)
GFR calc Af Amer: >60 mL/min (ref >60)
GFR calc non Af Amer: >60 mL/min (ref >60)
Glucose, Bld: 305 mg/dL — ABNORMAL HIGH (ref 70–99)
Potassium: 3.6 mmol/L (ref 3.5–5.1)
Sodium: 130 mmol/L — ABNORMAL LOW (ref 135–145)
Total Bilirubin: 0.7 mg/dL (ref 0.3–1.2)
Total Protein: 7.7 g/dL (ref 6.5–8.1)

## 2018-10-22 LAB — TROPONIN I: Troponin I: <0.03 ng/mL (ref <0.03)

## 2018-10-22 LAB — PHOSPHORUS: Phosphorus: 3 mg/dL (ref 2.5–4.6)

## 2018-10-22 LAB — BRAIN NATRIURETIC PEPTIDE: B Natriuretic Peptide: 125.1 pg/mL — ABNORMAL HIGH (ref 0.0–100.0)

## 2018-10-22 LAB — MAGNESIUM: Magnesium: 1.4 mg/dL — ABNORMAL LOW (ref 1.7–2.4)

## 2018-10-22 MED ORDER — IOHEXOL 350 MG/ML SOLN
100.0000 mL | Freq: Once | INTRAVENOUS | Status: AC | PRN
  Administered 2018-10-22: 23:00:00 100 mL via INTRAVENOUS

## 2018-10-22 MED ORDER — LACTATED RINGERS IV BOLUS
1000.0000 mL | Freq: Once | INTRAVENOUS | Status: AC
  Administered 2018-10-22: 23:00:00 1000 mL via INTRAVENOUS

## 2018-10-22 NOTE — ED Notes (Signed)
Called lab to add BNP to the lavender tube already sent down. They said they would add it and run it.

## 2018-10-22 NOTE — ED Triage Notes (Signed)
Patient reports noticing penis swelling today. He states " It just don't look right." He says it is painful as well. He also thinks he may be in heart failure.

## 2018-10-22 NOTE — ED Provider Notes (Signed)
[facility] EMERGENCY DEPARTMENT Provider Note   CSN: [id number] Arrival date & time: 10/22/18  2017    History   Chief Complaint Chief Complaint  Patient presents with  . Groin Swelling    HPI REYNARD CHRISTOFFERSEN is a 58 y.o. male.     HPI 59 year old man presents to the emergency department for evaluation of pain in his penis beginning today when he was fishing outside.  Patient woke up with a normal penis but states that when he was fishing he noticed pain and when he looked at his penis it appeared to be swollen.  Denies any recent trauma to the penis or any unusual activities with his penis.  Has never had this before.  No recent injuries.  Also feels somewhat short of breath today.  No recent fevers or chest pain.  Appetite is been normal.  No changes in bowel or bladder habits otherwise.  Still able to urinate. No pain in testicles.  No hx of hernias.  Has been taking his meds as directed.  Past Medical History:  Diagnosis Date  . Acute renal failure ([place])    in setting of NSAID use and orthopedic surgery 2010  . Anxiety and depression   . Chronic diastolic CHF (congestive heart failure) ([place])    a. Echo 6/17: severe conc LVH, vigorous EF, EF 65-70%, no dynamic obstruction, no RWMA, Gr 1 DD, mild TR  //  b. LHC 8/17: no sig CAD, LVEDP 28  . COPD (chronic obstructive pulmonary disease) ([place])   . Diabetic ulcer of left foot ([place])   . DM2 (diabetes mellitus, type 2) ([place])   . Family history of early CAD   . Fatty liver   . GERD (gastroesophageal reflux disease)   . History of amputation of foot ([place])    L trans-met // R toe  . History of cardiac catheterization    a. [place] 2002: irregs  //  b. LHC in 8/17: no sig CAD, apical DK, hyperdynamic LV, LVEDP 28  . History of kidney stones   . History of nuclear stress test    a. Nuc 7/17: Overall, intermediate risk nuclear stress test secondary to small size of apical lateral defect and reduced ejection fraction.  EF  43%  . HLD (hyperlipidemia)   . HTN (hypertension)   . Injuries     crushing injury to both his feet in February 2010.   Marland Kitchen Kidney calculi   . Palpitations   . Tobacco abuse     Patient Active Problem List   Diagnosis Date Noted  . Below knee amputation status, left 01/25/2018  . Wound dehiscence, surgical   . Foot amputation status, left 01/03/2018  . Status post left foot surgery 12/15/2017  . Snoring 06/07/2016  . Chest pain 01/22/2016  . Abnormal nuclear stress test 01/22/2016  . COPD (chronic obstructive pulmonary disease) ([place]) 01/27/2011  . Pre-operative cardiovascular examination 01/27/2011  . Nonspecific abnormal electrocardiogram (ECG) (EKG) 01/27/2011  . Murmur 01/27/2011  . DM2 (diabetes mellitus, type 2) ([place])   . HTN (hypertension)   . HLD (hyperlipidemia)   . Tobacco abuse     Past Surgical History:  Procedure Laterality Date  . AMPUTATION Left 01/03/2018   Procedure: LEFT MIDFOOT AMPUTATION/REVISION MIDAMPUTAION;  Surgeon: Mcarthur Rossetti, MD;  Location: [facility];  Service: Orthopedics;  Laterality: Left;  . AMPUTATION Left 01/25/2018   Procedure: LEFT BELOW KNEE AMPUTATION;  Surgeon: [name], MD;  Location: [facility];  Service:  Orthopedics;  Laterality: Left;  . BELOW KNEE LEG AMPUTATION Left 01/25/2018  . CARDIAC CATHETERIZATION N/A 01/22/2016   Procedure: Left Heart Cath and Coronary Angiography;  Surgeon: Peter M Martinique, MD;  Location: [facility];  Service: Cardiovascular;  Laterality: N/A;  . FOOT AMPUTATION Bilateral   . I&D EXTREMITY Left 12/15/2017   Procedure: IRRIGATION AND DEBRIDEMENT LEFT FOOT ULCER;  Surgeon: Mcarthur Rossetti, MD;  Location: [facility];  Service: Orthopedics;  Laterality: Left;  . LITHOTRIPSY    . TENDON LENGTHENING Bilateral    calf  . TONSILLECTOMY          Home Medications    Prior to Admission medications   Medication Sig Start Date End Date Taking? Authorizing Provider  albuterol (PROVENTIL  HFA;VENTOLIN HFA) 108 (90 Base) MCG/ACT inhaler Inhale 2 puffs into the lungs every 6 (six) hours as needed for wheezing or shortness of breath. 10/11/16   Collene Gobble, MD  amitriptyline (ELAVIL) 150 MG tablet Take 150 mg by mouth at bedtime.      [provider]  aspirin EC 81 MG tablet Take 1 tablet (81 mg total) by mouth daily. 11/28/15   Fay Records, MD  dapagliflozin propanediol (FARXIGA) 10 MG TABS tablet Take 10 mg by mouth daily.    [provider]  Dulaglutide 1.5 MG/0.5ML SOPN Inject 1.5 mg into the skin every Wednesday.  11/10/16   [provider]  furosemide (LASIX) 80 MG tablet Take 0.5 tablets (40 mg total) by mouth daily. 07/20/18   Bhagat, Crista Luria, PA  gabapentin (NEURONTIN) 300 MG capsule Take by mouth daily. Take 300 am, 600 pm by mouth 07/07/18   [provider]  glipiZIDE (GLUCOTROL XL) 10 MG 24 hr tablet Take 10 mg by mouth daily with breakfast.    [provider]  ipratropium (ATROVENT) 0.02 % nebulizer solution Take 2.5 mLs (0.5 mg total) by nebulization 4 (four) times daily. Patient taking differently: Take 0.5 mg by nebulization 4 (four) times daily as needed for wheezing or shortness of breath.  11/10/16   Parrett, Tammy S, NP  LORazepam (ATIVAN) 1 MG tablet Take 1 mg by mouth at bedtime.     [provider]  losartan (COZAAR) 50 MG tablet Take 1 tablet (50 mg total) by mouth daily. 07/03/18   Fay Records, MD  metFORMIN (GLUCOPHAGE-XR) 500 MG 24 hr tablet Take 1,000 mg by mouth 2 (two) times daily.     [provider]  metoprolol succinate (TOPROL-XL) 50 MG 24 hr tablet Take 1 tablet (50 mg total) by mouth 2 (two) times daily. Take with or immediately following a meal. 07/20/18 10/18/18  Bhagat, Crista Luria, PA  omeprazole (PRILOSEC) 40 MG capsule Take 40 mg by mouth daily.    [provider]  oxyCODONE-acetaminophen (PERCOCET/ROXICET) 5-325 MG tablet Take 1 tablet by mouth every 4 (four) hours as  needed for severe pain. 01/31/18   [name], MD  potassium chloride (KLOR-CON) 8 MEQ tablet Take 1 tablet (8 mEq total) by mouth daily as needed (take only when take lasix). 07/20/18   Bhagat, Crista Luria, PA  simvastatin (ZOCOR) 40 MG tablet Take 40 mg by mouth every evening.     [provider]  tiZANidine (ZANAFLEX) 4 MG capsule Take 12 mg by mouth at bedtime.     [provider]  traZODone (DESYREL) 50 MG tablet Take 50 mg by mouth at bedtime.     [provider]    [facility]  History Family History  Problem Relation Age of Onset  . Leukemia Mother 44       died  . Lung cancer Father 34       died  . Heart attack Brother 32  . Heart attack Brother 43  . Hypertension Brother        X3  . Hypertension Sister        X36  . Diabetes Sister   . Stroke Sister   . Diabetes Sister   . Other Brother        Musician accident    Social History Social History   Tobacco Use  . Smoking status: Former Smoker    Packs/day: 0.50    Years: 40.00    Pack years: 20.00    Types: Cigarettes    Last attempt to quit: 01/23/2018    Years since quitting: 0.7  . Smokeless tobacco: Never Used  Substance Use Topics  . Alcohol use: No  . Drug use: No     Allergies   Hydromorphone hcl er; Tapentadol; and Exalamide   Review of Systems Review of Systems  Constitutional: Negative for chills and fever.  HENT: Negative for congestion, ear pain and sore throat.   Eyes: Negative for pain and visual disturbance.  Respiratory: Negative for cough and shortness of breath.   Cardiovascular: Negative for chest pain and palpitations.  Gastrointestinal: Negative for abdominal pain and vomiting.  Endocrine: Negative for polyphagia and polyuria.  Genitourinary: Positive for penile pain and penile swelling. Negative for decreased urine volume, difficulty urinating, discharge, dysuria, flank pain, frequency, hematuria, scrotal swelling, testicular pain and urgency.  Musculoskeletal:  Negative for arthralgias and back pain.  Skin: Negative for color change and rash.  Allergic/Immunologic: Negative for immunocompromised state.  Neurological: Negative for seizures and syncope.  Hematological: Does not bruise/bleed easily.  Psychiatric/Behavioral: Negative for confusion.  All other systems reviewed and are negative.    Physical Exam Updated Vital Signs BP (!) 162/88   Pulse (!) 119   Temp 98.2 F (36.8 C) (Oral)   Resp 19   SpO2 94%   Physical Exam Vitals signs and nursing note reviewed.  Constitutional:      Appearance: Normal appearance. He is well-developed. He is obese. He is not ill-appearing or diaphoretic.  HENT:     Head: Normocephalic and atraumatic.     Nose: No congestion or rhinorrhea.     Mouth/Throat:     Pharynx: No oropharyngeal exudate or posterior oropharyngeal erythema.  Eyes:     Conjunctiva/sclera: Conjunctivae normal.  Neck:     Musculoskeletal: Neck supple. No neck rigidity or muscular tenderness.  Cardiovascular:     Rate and Rhythm: Regular rhythm. Tachycardia present.     Heart sounds: No murmur.  Pulmonary:     Effort: Pulmonary effort is normal. No respiratory distress.     Breath sounds: Normal breath sounds.  Abdominal:     Palpations: Abdomen is soft.     Tenderness: There is no abdominal tenderness.  Genitourinary:    Comments: Circumcised penis with circumferential swelling around the base of the penis.  Retracted glans but able to be reduced manually.  Minimal tenderness to palpation.  No signs of balanitis.  No inguinal hernias on palpation.  Testicles nontender and cremasteric reflex intact.  Fold of skin at the inferior portion of the glans of the penis which the patient says was left there from his circumcision. Musculoskeletal:        General: No deformity  or signs of injury.     Right lower leg: No edema.     Left lower leg: No edema.  Skin:    General: Skin is warm and dry.  Neurological:     General: No focal  deficit present.     Mental Status: He is alert and oriented to person, place, and time. Mental status is at baseline.     Cranial Nerves: No cranial nerve deficit.     Motor: No weakness.      ED Treatments / Results  Labs (all labs ordered are listed, but only abnormal results are displayed) Labs Reviewed  CBC WITH DIFFERENTIAL/PLATELET - Abnormal; Notable for the following components:      Result Value   WBC 12.0 (*)    Neutro Abs 7.8 (*)    All other components within normal limits  TROPONIN I  TROPONIN I  COMPREHENSIVE METABOLIC PANEL  MAGNESIUM  PHOSPHORUS    EKG None  Radiology No results found.  Procedures Procedures (including critical care time)  Medications Ordered in ED Medications - No data to display   Initial Impression / Assessment and Plan / ED Course  I have reviewed the triage vital signs and the nursing notes.  Pertinent labs & imaging results that were available during my care of the patient were reviewed by me and considered in my medical decision making (see chart for details).        Hemodynamically stable 58 year old man with a history of heart failure presents to the emergency department for evaluation of penis that is retracted inside of himself.  Patient says that he had this problem once before and his physician was able to "pull it back out of him".  Is still able to urinate and is not in any pain at this time.  CT scan of the abdomen pelvis does not show any abnormalities of the genitalia does show specific cause for this problem.  He does not appear to have acute infection such as balanitis at this time.  He is circumcised so it is unlikely phimosis or paraphimosis.  I believe it is most likely related to his volume overload.  He is somewhat more tachycardic than normal with a heart rate between 110 and 120 bpm while in the emergency department.  We attempted a fluid bolus but this made no change in his rate.  Basic lab work appears to be  unremarkable except for a slight leukocytosis of 12.  He has no other infectious signs or symptoms such as sore throat, cough, abdominal pain, chest pain, shortness of breath, dysuria, hematuria.  It appears on previous records that the patient frequently has tachycardia to 100 to 110 bpm while in the office.  This may very well be his baseline.  He has follow-up with his primary care physician tomorrow he believes and I have put in a referral to urology about his genital problem.  At this time he has no pain or swelling of his testicles to suggest orchitis, testicular torsion or other abnormality.  Discharged in good condition. Final Clinical Impressions(s) / ED Diagnoses   Final diagnoses:  Swelling of penis  Tachycardia    ED Discharge Orders    None       Andee Poles, MD 10/23/18 [place]    Carmin Muskrat, MD 10/24/18 [phone]

## 2018-10-22 NOTE — ED Notes (Signed)
Patient transported to CT 

## 2018-10-23 NOTE — Discharge Instructions (Addendum)
You will be called to set up a follow-up appointment with a urologist this week.  Please also follow-up with your primary care doctor about further discussion about your fluid pills as well as your genital problem.  The CAT scan of your chest abdomen pelvis appears to be good and does not show specific problems with your genitals at this time.  It does not appear to be infected at this time either and is more likely related to your heart failure and may resolve your doctor is able to help with your fluids.

## 2018-10-25 ENCOUNTER — Other Ambulatory Visit: Payer: Self-pay

## 2018-10-25 ENCOUNTER — Emergency Department (HOSPITAL_COMMUNITY): Payer: Medicare HMO

## 2018-10-25 ENCOUNTER — Emergency Department (HOSPITAL_COMMUNITY)
Admission: EM | Admit: 2018-10-25 | Discharge: 2018-10-25 | Disposition: A | Discharge status: Home / Self Care | Payer: Medicare HMO | Attending: Emergency Medicine | Admitting: Emergency Medicine

## 2018-10-25 ENCOUNTER — Encounter (HOSPITAL_COMMUNITY): Payer: Self-pay | Admitting: Emergency Medicine

## 2018-10-25 DIAGNOSIS — Z5320 Procedure and treatment not carried out because of patient's decision for unspecified reasons: Secondary | ICD-10-CM | POA: Insufficient documentation

## 2018-10-25 DIAGNOSIS — E119 Type 2 diabetes mellitus without complications: Secondary | ICD-10-CM | POA: Diagnosis not present

## 2018-10-25 DIAGNOSIS — Z7982 Long term (current) use of aspirin: Secondary | ICD-10-CM | POA: Insufficient documentation

## 2018-10-25 DIAGNOSIS — I110 Hypertensive heart disease with heart failure: Secondary | ICD-10-CM | POA: Insufficient documentation

## 2018-10-25 DIAGNOSIS — R0602 Shortness of breath: Secondary | ICD-10-CM | POA: Diagnosis present

## 2018-10-25 DIAGNOSIS — Z20828 Contact with and (suspected) exposure to other viral communicable diseases: Secondary | ICD-10-CM | POA: Diagnosis not present

## 2018-10-25 DIAGNOSIS — Z87891 Personal history of nicotine dependence: Secondary | ICD-10-CM | POA: Diagnosis not present

## 2018-10-25 DIAGNOSIS — I5032 Chronic diastolic (congestive) heart failure: Secondary | ICD-10-CM | POA: Diagnosis not present

## 2018-10-25 DIAGNOSIS — R000 Tachycardia, unspecified: Secondary | ICD-10-CM

## 2018-10-25 DIAGNOSIS — Z79899 Other long term (current) drug therapy: Secondary | ICD-10-CM | POA: Insufficient documentation

## 2018-10-25 DIAGNOSIS — J449 Chronic obstructive pulmonary disease, unspecified: Secondary | ICD-10-CM | POA: Diagnosis not present

## 2018-10-25 DIAGNOSIS — Z7984 Long term (current) use of oral hypoglycemic drugs: Secondary | ICD-10-CM | POA: Diagnosis not present

## 2018-10-25 LAB — CBC
HCT: 52.7 % — ABNORMAL HIGH (ref 39.0–52.0)
Hemoglobin: 17.2 g/dL — ABNORMAL HIGH (ref 13.0–17.0)
MCH: 28.2 pg (ref 26.0–34.0)
MCHC: 32.6 g/dL (ref 30.0–36.0)
MCV: 86.3 fL (ref 80.0–100.0)
Platelets: 255 10*3/uL (ref 150–400)
RBC: 6.11 MIL/uL — ABNORMAL HIGH (ref 4.22–5.81)
RDW: 13.7 % (ref 11.5–15.5)
WBC: 12.7 10*3/uL — ABNORMAL HIGH (ref 4.0–10.5)
nRBC: 0 % (ref 0.0–0.2)

## 2018-10-25 LAB — TROPONIN I: Troponin I: <0.03 ng/mL (ref <0.03)

## 2018-10-25 LAB — BASIC METABOLIC PANEL
Anion gap: 18 — ABNORMAL HIGH (ref 5–15)
BUN: 14 mg/dL (ref 6–20)
CO2: 27 mmol/L (ref 22–32)
Calcium: 9.9 mg/dL (ref 8.9–10.3)
Chloride: 93 mmol/L — ABNORMAL LOW (ref 98–111)
Creatinine, Ser: 0.86 mg/dL (ref 0.61–1.24)
GFR calc Af Amer: >60 mL/min (ref >60)
GFR calc non Af Amer: >60 mL/min (ref >60)
Glucose, Bld: 259 mg/dL — ABNORMAL HIGH (ref 70–99)
Potassium: 3.1 mmol/L — ABNORMAL LOW (ref 3.5–5.1)
Sodium: 138 mmol/L (ref 135–145)

## 2018-10-25 LAB — SARS CORONAVIRUS 2 BY RT PCR (HOSPITAL ORDER, PERFORMED IN ~~LOC~~ HOSPITAL LAB): SARS Coronavirus 2: NEGATIVE

## 2018-10-25 MED ORDER — SODIUM CHLORIDE 0.9% FLUSH
3.0000 mL | Freq: Once | INTRAVENOUS | Status: AC
  Administered 2018-10-25: 3 mL via INTRAVENOUS

## 2018-10-25 MED ORDER — METOPROLOL TARTRATE 5 MG/5ML IV SOLN
5.0000 mg | Freq: Once | INTRAVENOUS | Status: AC
  Administered 2018-10-25: 5 mg via INTRAVENOUS
  Filled 2018-10-25: qty 5

## 2018-10-25 NOTE — Discharge Instructions (Signed)
Person Under Monitoring Name: Troy [name]  Location: [name] [name] [address]   Infection Prevention Recommendations for Individuals Confirmed to have, or Being Evaluated for, 2019 Novel Coronavirus (COVID-19) Infection Who Receive Care at Home  Individuals who are confirmed to have, or are being evaluated for, COVID-19 should follow the prevention steps below until a healthcare provider or local or state health department says they can return to normal activities.  Stay home except to get medical care You should restrict activities outside your home, except for getting medical care. Do not go to work, school, or public areas, and do not use public transportation or taxis.  Call ahead before visiting your doctor Before your medical appointment, call the healthcare provider and tell them that you have, or are being evaluated for, COVID-19 infection. This will help the healthcare providers office take steps to keep other people from getting infected. Ask your healthcare provider to call the local or state health department.  Monitor your symptoms Seek prompt medical attention if your illness is worsening (e.g., difficulty breathing). Before going to your medical appointment, call the healthcare provider and tell them that you have, or are being evaluated for, COVID-19 infection. Ask your healthcare provider to call the local or state health department.  Wear a facemask You should wear a facemask that covers your nose and mouth when you are in the same room with other people and when you visit a healthcare provider. People who live with or visit you should also wear a facemask while they are in the same room with you.  Separate yourself from other people in your home As much as possible, you should stay in a different room from other people in your home. Also, you should use a separate bathroom, if available.  Avoid sharing household items You should not share  dishes, drinking glasses, cups, eating utensils, towels, bedding, or other items with other people in your home. After using these items, you should wash them thoroughly with soap and water.  Cover your coughs and sneezes Cover your mouth and nose with a tissue when you cough or sneeze, or you can cough or sneeze into your sleeve. Throw used tissues in a lined trash can, and immediately wash your hands with soap and water for at least 20 seconds or use an alcohol-based hand rub.  Wash your [facility] your hands often and thoroughly with soap and water for at least 20 seconds. You can use an alcohol-based hand sanitizer if soap and water are not available and if your hands are not visibly dirty. Avoid touching your eyes, nose, and mouth with unwashed hands.   Prevention Steps for Caregivers and Household Members of Individuals Confirmed to have, or Being Evaluated for, COVID-19 Infection Being Cared for in the Home  If you live with, or provide care at home for, a person confirmed to have, or being evaluated for, COVID-19 infection please follow these guidelines to prevent infection:  Follow healthcare providers instructions Make sure that you understand and can help the patient follow any healthcare provider instructions for all care.  Provide for the patients basic needs You should help the patient with basic needs in the home and provide support for getting groceries, prescriptions, and other personal needs.  Monitor the patients symptoms If they are getting sicker, call his or her medical provider and tell them that the patient has, or is being evaluated for, COVID-19 infection. This will help the healthcare providers  office take steps to keep other people from getting infected. Ask the healthcare provider to call the local or state health department.  Limit the number of people who have contact with the patient If possible, have only one caregiver for the patient. Other  household members should stay in another home or place of residence. If this is not possible, they should stay in another room, or be separated from the patient as much as possible. Use a separate bathroom, if available. Restrict visitors who do not have an essential need to be in the home.  Keep older adults, very young children, and other sick people away from the patient Keep older adults, very young children, and those who have compromised immune systems or chronic health conditions away from the patient. This includes people with chronic heart, lung, or kidney conditions, diabetes, and cancer.  Ensure good ventilation Make sure that shared spaces in the home have good air flow, such as from an air conditioner or an opened window, weather permitting.  Wash your hands often Wash your hands often and thoroughly with soap and water for at least 20 seconds. You can use an alcohol based hand sanitizer if soap and water are not available and if your hands are not visibly dirty. Avoid touching your eyes, nose, and mouth with unwashed hands. Use disposable paper towels to dry your hands. If not available, use dedicated cloth towels and replace them when they become wet.  Wear a facemask and gloves Wear a disposable facemask at all times in the room and gloves when you touch or have contact with the patients blood, body fluids, and/or secretions or excretions, such as sweat, saliva, sputum, nasal mucus, vomit, urine, or feces.  Ensure the mask fits over your nose and mouth tightly, and do not touch it during use. Throw out disposable facemasks and gloves after using them. Do not reuse. Wash your hands immediately after removing your facemask and gloves. If your personal clothing becomes contaminated, carefully remove clothing and launder. Wash your hands after handling contaminated clothing. Place all used disposable facemasks, gloves, and other waste in a lined container before disposing them with  other household waste. Remove gloves and wash your hands immediately after handling these items.  Do not share dishes, glasses, or other household items with the patient Avoid sharing household items. You should not share dishes, drinking glasses, cups, eating utensils, towels, bedding, or other items with a patient who is confirmed to have, or being evaluated for, COVID-19 infection. After the person uses these items, you should wash them thoroughly with soap and water.  Wash laundry thoroughly Immediately remove and wash clothes or bedding that have blood, body fluids, and/or secretions or excretions, such as sweat, saliva, sputum, nasal mucus, vomit, urine, or feces, on them. Wear gloves when handling laundry from the patient. Read and follow directions on labels of laundry or clothing items and detergent. In general, wash and dry with the warmest temperatures recommended on the label.  Clean all areas the individual has used often Clean all touchable surfaces, such as counters, tabletops, doorknobs, bathroom fixtures, toilets, phones, keyboards, tablets, and bedside tables, every day. Also, clean any surfaces that may have blood, body fluids, and/or secretions or excretions on them. Wear gloves when cleaning surfaces the patient has come in contact with. Use a diluted bleach solution (e.g., dilute bleach with 1 part bleach and 10 parts water) or a household disinfectant with a label that says EPA-registered for coronaviruses. To make a  bleach solution at home, add 1 tablespoon of bleach to 1 quart (4 cups) of water. For a larger supply, add  cup of bleach to 1 gallon (16 cups) of water. Read labels of cleaning products and follow recommendations provided on product labels. Labels contain instructions for safe and effective use of the cleaning product including precautions you should take when applying the product, such as wearing gloves or eye protection and making sure you have good ventilation  during use of the product. Remove gloves and wash hands immediately after cleaning.  Monitor yourself for signs and symptoms of illness Caregivers and household members are considered close contacts, should monitor their health, and will be asked to limit movement outside of the home to the extent possible. Follow the monitoring steps for close contacts listed on the symptom monitoring form.   ? If you have additional questions, contact your local health department or call the epidemiologist on call at [phone] (available 24/7). ? This guidance is subject to change. For the most up-to-date guidance from [facility], please refer to their website: [email or web address]

## 2018-10-25 NOTE — ED Notes (Signed)
Pt spoke with MD Gilford Raid- and wishes to sign himself out AMA- pt verbally acknowledges risk of death and injury by leaving against medical advice however pt still wishes to leave. Signing pad will not work in room.

## 2018-10-25 NOTE — ED Provider Notes (Signed)
[facility] EMERGENCY DEPARTMENT Provider Note   CSN: [id number] Arrival date & time: 10/25/18  1606    History   Chief Complaint Chief Complaint  Patient presents with  . Shortness of Breath    HPI AMY BELLOSO is a 58 y.o. male.     Pt presents to the ED today with sob.  Pt said it lasted for a brief time, and he's feeling better now.  He also feels like his heart is beating fast.   He was here on 5/3 for balanitis which he said is getting better.  He denies any known sick exposures.  No f/c.  No cough.      Past Medical History:  Diagnosis Date  . Acute renal failure ([place])    in setting of NSAID use and orthopedic surgery 2010  . Anxiety and depression   . Chronic diastolic CHF (congestive heart failure) ([place])    a. Echo 6/17: severe conc LVH, vigorous EF, EF 65-70%, no dynamic obstruction, no RWMA, Gr 1 DD, mild TR  //  b. LHC 8/17: no sig CAD, LVEDP 28  . COPD (chronic obstructive pulmonary disease) ([place])   . Diabetic ulcer of left foot ([place])   . DM2 (diabetes mellitus, type 2) ([place])   . Family history of early CAD   . Fatty liver   . GERD (gastroesophageal reflux disease)   . History of amputation of foot ([place])    L trans-met // R toe  . History of cardiac catheterization    a. [place] 2002: irregs  //  b. LHC in 8/17: no sig CAD, apical DK, hyperdynamic LV, LVEDP 28  . History of kidney stones   . History of nuclear stress test    a. Nuc 7/17: Overall, intermediate risk nuclear stress test secondary to small size of apical lateral defect and reduced ejection fraction.  EF 43%  . HLD (hyperlipidemia)   . HTN (hypertension)   . Injuries     crushing injury to both his feet in February 2010.   [place] Kitchen Kidney calculi   . Palpitations   . Tobacco abuse     Patient Active Problem List   Diagnosis Date Noted  . Below knee amputation status, left 01/25/2018  . Wound dehiscence, surgical   . Foot amputation status, left 01/03/2018  . Status post left  foot surgery 12/15/2017  . Snoring 06/07/2016  . Chest pain 01/22/2016  . Abnormal nuclear stress test 01/22/2016  . COPD (chronic obstructive pulmonary disease) ([place]) 01/27/2011  . Pre-operative cardiovascular examination 01/27/2011  . Nonspecific abnormal electrocardiogram (ECG) (EKG) 01/27/2011  . Murmur 01/27/2011  . DM2 (diabetes mellitus, type 2) ([place])   . HTN (hypertension)   . HLD (hyperlipidemia)   . Tobacco abuse     Past Surgical History:  Procedure Laterality Date  . AMPUTATION Left 01/03/2018   Procedure: LEFT MIDFOOT AMPUTATION/REVISION MIDAMPUTAION;  Surgeon: Mcarthur Rossetti, MD;  Location: [facility];  Service: Orthopedics;  Laterality: Left;  . AMPUTATION Left 01/25/2018   Procedure: LEFT BELOW KNEE AMPUTATION;  Surgeon: [name], MD;  Location: [facility];  Service: Orthopedics;  Laterality: Left;  . BELOW KNEE LEG AMPUTATION Left 01/25/2018  . CARDIAC CATHETERIZATION N/A 01/22/2016   Procedure: Left Heart Cath and Coronary Angiography;  Surgeon: Peter M Martinique, MD;  Location: [facility];  Service: Cardiovascular;  Laterality: N/A;  . FOOT AMPUTATION Bilateral   . I&D EXTREMITY Left 12/15/2017   Procedure: IRRIGATION AND DEBRIDEMENT  LEFT FOOT ULCER;  Surgeon: Mcarthur Rossetti, MD;  Location: [facility];  Service: Orthopedics;  Laterality: Left;  . LITHOTRIPSY    . TENDON LENGTHENING Bilateral    calf  . TONSILLECTOMY          Home Medications    Prior to Admission medications   Medication Sig Start Date End Date Taking? Authorizing Provider  albuterol (PROVENTIL HFA;VENTOLIN HFA) 108 (90 Base) MCG/ACT inhaler Inhale 2 puffs into the lungs every 6 (six) hours as needed for wheezing or shortness of breath. 10/11/16  Yes Collene Gobble, MD  amitriptyline (ELAVIL) 150 MG tablet Take 150 mg by mouth at bedtime.     Yes [provider]  aspirin EC 81 MG tablet Take 1 tablet (81 mg total) by mouth daily. 11/28/15  Yes Fay Records, MD   dapagliflozin propanediol (FARXIGA) 10 MG TABS tablet Take 10 mg by mouth daily.   Yes [provider]  Dulaglutide (TRULICITY) 1.5 HE/5.2DP SOPN Inject 1.5 mg into the skin every 7 (seven) days. 0.5 ml = 1.5 mg 08/23/18  Yes [provider]  furosemide (LASIX) 80 MG tablet Take 0.5 tablets (40 mg total) by mouth daily. Patient taking differently: Take 80 mg by mouth daily.  07/20/18  Yes Bhagat, Bhavinkumar, PA  gabapentin (NEURONTIN) 300 MG capsule Take by mouth daily. Take 300 am, 600 pm by mouth 07/07/18   [provider]  glipiZIDE (GLUCOTROL XL) 10 MG 24 hr tablet Take 10 mg by mouth daily with breakfast.    [provider]  ipratropium (ATROVENT) 0.02 % nebulizer solution Take 2.5 mLs (0.5 mg total) by nebulization 4 (four) times daily. Patient taking differently: Take 0.5 mg by nebulization 4 (four) times daily as needed for wheezing or shortness of breath.  11/10/16   Parrett, Tammy S, NP  LORazepam (ATIVAN) 1 MG tablet Take 1 mg by mouth at bedtime.     [provider]  losartan (COZAAR) 50 MG tablet Take 1 tablet (50 mg total) by mouth daily. 07/03/18   Fay Records, MD  metFORMIN (GLUCOPHAGE-XR) 500 MG 24 hr tablet Take 1,000 mg by mouth 2 (two) times daily.     [provider]  metoprolol succinate (TOPROL-XL) 50 MG 24 hr tablet Take 1 tablet (50 mg total) by mouth 2 (two) times daily. Take with or immediately following a meal. 07/20/18 10/18/18  Bhagat, Crista Luria, PA  omeprazole (PRILOSEC) 40 MG capsule Take 40 mg by mouth daily.    [provider]  oxyCODONE-acetaminophen (PERCOCET) 10-325 MG tablet Take 1 tablet by mouth every 4 (four) hours as needed for pain. 10/14/18   [provider]  oxyCODONE-acetaminophen (PERCOCET/ROXICET) 5-325 MG tablet Take 1 tablet by mouth every 4 (four) hours as needed for severe pain. 01/31/18   [name], MD  potassium chloride (KLOR-CON) 8 MEQ tablet Take 1 tablet (8 mEq total) by  mouth daily as needed (take only when take lasix). 07/20/18   Bhagat, Crista Luria, PA  simvastatin (ZOCOR) 40 MG tablet Take 40 mg by mouth every evening.     [provider]  tiZANidine (ZANAFLEX) 4 MG capsule Take 12 mg by mouth at bedtime.     [provider]  tiZANidine (ZANAFLEX) 4 MG tablet Take 4-12 mg by mouth at bedtime. 08/26/18   [provider]  traZODone (DESYREL) 50 MG tablet Take 50 mg by mouth at bedtime.     [provider]    Family History Family  History  Problem Relation Age of Onset  . Leukemia Mother 74       died  . Lung cancer Father 45       died  . Heart attack Brother 47  . Heart attack Brother 34  . Hypertension Brother        X3  . Hypertension Sister        X71  . Diabetes Sister   . Stroke Sister   . Diabetes Sister   . Other Brother        Musician accident    Social History Social History   Tobacco Use  . Smoking status: Former Smoker    Packs/day: 0.50    Years: 40.00    Pack years: 20.00    Types: Cigarettes    Last attempt to quit: 01/23/2018    Years since quitting: 0.7  . Smokeless tobacco: Never Used  Substance Use Topics  . Alcohol use: No  . Drug use: No     Allergies   Hydromorphone hcl er; Tapentadol; and Exalamide   Review of Systems Review of Systems  Respiratory: Positive for shortness of breath.   Cardiovascular: Positive for palpitations.  All other systems reviewed and are negative.    Physical Exam Updated Vital Signs BP (!) 151/96   Pulse 94   Temp 98.2 F (36.8 C) (Oral)   Resp 11   SpO2 98%   Physical Exam Vitals signs and nursing note reviewed.  Constitutional:      Appearance: He is well-developed.  HENT:     Head: Normocephalic and atraumatic.     Mouth/Throat:     Mouth: Mucous membranes are moist.     Pharynx: Oropharynx is clear.  Eyes:     Extraocular Movements: Extraocular movements intact.     Pupils: Pupils are equal, round, and reactive to light.   Neck:     Musculoskeletal: Normal range of motion and neck supple.  Cardiovascular:     Rate and Rhythm: Regular rhythm. Tachycardia present.  Pulmonary:     Effort: Pulmonary effort is normal.     Breath sounds: Normal breath sounds.  Abdominal:     General: Bowel sounds are normal.     Palpations: Abdomen is soft.  Musculoskeletal:     Comments: Left bka  Skin:    General: Skin is warm and dry.     Capillary Refill: Capillary refill takes less than 2 seconds.  Neurological:     General: No focal deficit present.     Mental Status: He is alert and oriented to person, place, and time.  Psychiatric:        Mood and Affect: Mood normal.        Behavior: Behavior normal.      ED Treatments / Results  Labs (all labs ordered are listed, but only abnormal results are displayed) Labs Reviewed  BASIC METABOLIC PANEL - Abnormal; Notable for the following components:      Result Value   Potassium 3.1 (*)    Chloride 93 (*)    Glucose, Bld 259 (*)    Anion gap 18 (*)    All other components within normal limits  CBC - Abnormal; Notable for the following components:   WBC 12.7 (*)    RBC 6.11 (*)    Hemoglobin 17.2 (*)    HCT 52.7 (*)    All other components within normal limits  SARS CORONAVIRUS 2 (HOSPITAL ORDER, [facility])  TROPONIN I  D-DIMER, QUANTITATIVE (NOT AT [facility])    EKG EKG Interpretation  Date/Time:  Wednesday Oct 25 2018 17:12:24 EDT Ventricular Rate:  98 PR Interval:  190 QRS Duration: 92 QT Interval:  404 QTC Calculation: 516 R Axis:   67 Text Interpretation:  Sinus tachycardia Atrial premature complexes Abnormal T, consider ischemia, diffuse leads Prolonged QT interval Since last tracing rate slower but more diffuse t wave changes Confirmed by Isla Pence [phone]) on 10/25/2018 5:19:26 PM   Radiology Dg Chest Portable 1 View  Result Date: 10/25/2018 CLINICAL DATA:  Shortness of breath EXAM: PORTABLE CHEST 1 VIEW  COMPARISON:  Chest x-ray dated 10/22/2018 FINDINGS: The cardiac silhouette remains mildly enlarged. A similar metallic foreign body projects over the patient's left lung apex. There is no pneumothorax. No large pleural effusion. An old fracture of the left clavicle is noted. IMPRESSION: No active disease. Electronically Signed   By: Constance Holster M.D.   On: 10/25/2018 17:57    Procedures Procedures (including critical care time)  Medications Ordered in ED Medications  sodium chloride flush (NS) 0.9 % injection 3 mL (3 mLs Intravenous Given 10/25/18 1639)  metoprolol tartrate (LOPRESSOR) injection 5 mg (5 mg Intravenous Given 10/25/18 1639)     Initial Impression / Assessment and Plan / ED Course  I have reviewed the triage vital signs and the nursing notes.  Pertinent labs & imaging results that were available during my care of the patient were reviewed by me and considered in my medical decision making (see chart for details).      We were in the middle of his work up and he refused to stay any more for his tests.  I told him we could not rule out a PE or MI.  We also do not have his Covid test back yet.  I asked him if there was anything I could do to help him stay, but he started ripping off monitor.  I called his wife and updated her on what was going on.  I told her and him that he is welcome to return at any time.  Final Clinical Impressions(s) / ED Diagnoses   Final diagnoses:  Sinus tachycardia    ED Discharge Orders    None       Isla Pence, MD 10/25/18 [phone]

## 2018-10-25 NOTE — ED Triage Notes (Signed)
PT reports SOB that lasted for a few minutes prior to arrival.  States it is better now.  States he wasn't doing anything when SOB started.  HR 130 on arrival.

## 2018-10-26 ENCOUNTER — Encounter (HOSPITAL_COMMUNITY): Payer: Self-pay | Admitting: Emergency Medicine

## 2018-10-26 ENCOUNTER — Other Ambulatory Visit: Payer: Self-pay

## 2018-10-26 ENCOUNTER — Emergency Department (HOSPITAL_COMMUNITY)
Admission: EM | Admit: 2018-10-26 | Discharge: 2018-10-26 | Discharge status: Against Medical Advice | Payer: Medicare HMO | Attending: Emergency Medicine | Admitting: Emergency Medicine

## 2018-10-26 ENCOUNTER — Other Ambulatory Visit: Payer: Self-pay | Admitting: Physician Assistant

## 2018-10-26 DIAGNOSIS — Z5320 Procedure and treatment not carried out because of patient's decision for unspecified reasons: Secondary | ICD-10-CM | POA: Insufficient documentation

## 2018-10-26 DIAGNOSIS — I110 Hypertensive heart disease with heart failure: Secondary | ICD-10-CM | POA: Diagnosis not present

## 2018-10-26 DIAGNOSIS — R0609 Other forms of dyspnea: Secondary | ICD-10-CM | POA: Insufficient documentation

## 2018-10-26 DIAGNOSIS — Z7982 Long term (current) use of aspirin: Secondary | ICD-10-CM | POA: Diagnosis not present

## 2018-10-26 DIAGNOSIS — Z79899 Other long term (current) drug therapy: Secondary | ICD-10-CM | POA: Insufficient documentation

## 2018-10-26 DIAGNOSIS — R000 Tachycardia, unspecified: Secondary | ICD-10-CM

## 2018-10-26 DIAGNOSIS — E119 Type 2 diabetes mellitus without complications: Secondary | ICD-10-CM | POA: Diagnosis not present

## 2018-10-26 DIAGNOSIS — Z7984 Long term (current) use of oral hypoglycemic drugs: Secondary | ICD-10-CM | POA: Diagnosis not present

## 2018-10-26 DIAGNOSIS — I5032 Chronic diastolic (congestive) heart failure: Secondary | ICD-10-CM | POA: Diagnosis not present

## 2018-10-26 DIAGNOSIS — R51 Headache: Secondary | ICD-10-CM | POA: Diagnosis present

## 2018-10-26 DIAGNOSIS — J449 Chronic obstructive pulmonary disease, unspecified: Secondary | ICD-10-CM | POA: Diagnosis not present

## 2018-10-26 DIAGNOSIS — H5710 Ocular pain, unspecified eye: Secondary | ICD-10-CM | POA: Diagnosis not present

## 2018-10-26 DIAGNOSIS — F17210 Nicotine dependence, cigarettes, uncomplicated: Secondary | ICD-10-CM | POA: Diagnosis not present

## 2018-10-26 DIAGNOSIS — Z5329 Procedure and treatment not carried out because of patient's decision for other reasons: Secondary | ICD-10-CM

## 2018-10-26 LAB — COMPREHENSIVE METABOLIC PANEL
ALT: 25 U/L (ref 0–44)
AST: 35 U/L (ref 15–41)
Albumin: 4 g/dL (ref 3.5–5.0)
Alkaline Phosphatase: 89 U/L (ref 38–126)
Anion gap: 14 (ref 5–15)
BUN: 13 mg/dL (ref 6–20)
CO2: 30 mmol/L (ref 22–32)
Calcium: 9.7 mg/dL (ref 8.9–10.3)
Chloride: 93 mmol/L — ABNORMAL LOW (ref 98–111)
Creatinine, Ser: 0.86 mg/dL (ref 0.61–1.24)
GFR calc Af Amer: >60 mL/min (ref >60)
GFR calc non Af Amer: >60 mL/min (ref >60)
Glucose, Bld: 383 mg/dL — ABNORMAL HIGH (ref 70–99)
Potassium: 3.7 mmol/L (ref 3.5–5.1)
Sodium: 137 mmol/L (ref 135–145)
Total Bilirubin: 0.8 mg/dL (ref 0.3–1.2)
Total Protein: 7.8 g/dL (ref 6.5–8.1)

## 2018-10-26 LAB — CBC
HCT: 50.2 % (ref 39.0–52.0)
Hemoglobin: 16.3 g/dL (ref 13.0–17.0)
MCH: 27.9 pg (ref 26.0–34.0)
MCHC: 32.5 g/dL (ref 30.0–36.0)
MCV: 86 fL (ref 80.0–100.0)
Platelets: 220 10*3/uL (ref 150–400)
RBC: 5.84 MIL/uL — ABNORMAL HIGH (ref 4.22–5.81)
RDW: 13.5 % (ref 11.5–15.5)
WBC: 10.6 10*3/uL — ABNORMAL HIGH (ref 4.0–10.5)
nRBC: 0 % (ref 0.0–0.2)

## 2018-10-26 LAB — DIFFERENTIAL
Abs Immature Granulocytes: 0.04 10*3/uL (ref 0.00–0.07)
Basophils Absolute: 0.1 10*3/uL (ref 0.0–0.1)
Basophils Relative: 1 %
Eosinophils Absolute: 0.2 10*3/uL (ref 0.0–0.5)
Eosinophils Relative: 1 %
Immature Granulocytes: 0 %
Lymphocytes Relative: 26 %
Lymphs Abs: 2.7 10*3/uL (ref 0.7–4.0)
Monocytes Absolute: 0.7 10*3/uL (ref 0.1–1.0)
Monocytes Relative: 6 %
Neutro Abs: 7 10*3/uL (ref 1.7–7.7)
Neutrophils Relative %: 66 %

## 2018-10-26 LAB — I-STAT CREATININE, ED: Creatinine, Ser: 0.7 mg/dL (ref 0.61–1.24)

## 2018-10-26 LAB — APTT: aPTT: 39 seconds — ABNORMAL HIGH (ref 24–36)

## 2018-10-26 LAB — PROTIME-INR
INR: 1.1 (ref 0.8–1.2)
Prothrombin Time: 13.7 seconds (ref 11.4–15.2)

## 2018-10-26 MED ORDER — SODIUM CHLORIDE 0.9% FLUSH: 3.0000 mL | Freq: Once | INTRAVENOUS | Status: DC

## 2018-10-26 NOTE — ED Triage Notes (Signed)
Onset during the days developed headache and eye pain. Seen in the ED yesterday and signed out AMA last night. Concerned still having the same symptoms. Alert answering and following commands appropriate.

## 2018-10-26 NOTE — Discharge Instructions (Addendum)
You presented to ED for head numbness that lasted 30 seconds.  Labs so far were normal.  The cause of your numbness is unclear but not typical of bleeding, stroke.  You denied headache, eye pain, stroke symptoms.    This is your second ED visit and both times your heart rate was noted to be elevated as high as 130s.  You reported some shortness of breath with walking "for a while". We recommended cardiac work up and a CT of your chest to evaluate for causes of high heart rate but you declined this today and again left AGAINST MEDICAL ADVICE.    Please call your cardiologist TODAY and make an appointment for further discussion of your shortness of breath with walking, elevated heart rate.   Return at any time for re-evaluation

## 2018-10-26 NOTE — ED Provider Notes (Addendum)
[facility] EMERGENCY DEPARTMENT Provider Note   CSN: [id number] Arrival date & time: 10/26/18  1315    History   Chief Complaint Chief Complaint  Patient presents with  . Headache  . Eye Pain   HPI Troy [name] is a 58 y.o. male with h/o diastolic HF, DM, HL, HTN COPD, s/p BKA of LLE, ongoing tobacco use, nonobstructive CAD, sinus tachycardia here for evaluation of "head numbness".  This occurred yesterday while he was watching TV. Numbness is described as tingling, localized to the top of his head without radiationg lasting 30 seconds without recurrence.  Tingling began shortly after he took his daily dose of percocet 10 mg.  Has been on this medicine for chronic pain for a while.  No new meds. No frank pain in the head or headache with this.  No associated vision changes/loss, nausea, vomiting, neck pain, tingling anywhere else, unilateral weakness or loss of sensation, difficulty with swallowing, speech or balance.  Triage RN documented eye pain but patient denies eye pain.  States he came to the ED yesterday for this but left AMA due to wait time. Per EDMD note yesterday patient came in for SOB and noted to be tachycardic in the 130s.  I asked patient about this and he states he did not come in for SOB yesterday but came in for head numbness.  He walks 1 mi every day and gets short of breath sometimes with this which he attributes to smoking.  He walked 1 mile yesterday and felt mild exertional dyspnea typical for him.  He had no associated CP, neck pain, palpitations, light-headedness, diaphoresis, palpitations, nausea, vomiting.   On aspirin daily. No h/o DVT/PE. No recent prolonged immobilization, surgery, leg swelling or calf pain. No use of hormone therapy, cancer. 3 brothers died of an MI, sister just had an MI.  HPI  Past Medical History:  Diagnosis Date  . Acute renal failure ([place])    in setting of NSAID use and orthopedic surgery 2010  . Anxiety and depression    . Chronic diastolic CHF (congestive heart failure) ([place])    a. Echo 6/17: severe conc LVH, vigorous EF, EF 65-70%, no dynamic obstruction, no RWMA, Gr 1 DD, mild TR  //  b. LHC 8/17: no sig CAD, LVEDP 28  . COPD (chronic obstructive pulmonary disease) ([place])   . Diabetic ulcer of left foot ([place])   . DM2 (diabetes mellitus, type 2) ([place])   . Family history of early CAD   . Fatty liver   . GERD (gastroesophageal reflux disease)   . History of amputation of foot ([place])    L trans-met // R toe  . History of cardiac catheterization    a. [place] 2002: irregs  //  b. LHC in 8/17: no sig CAD, apical DK, hyperdynamic LV, LVEDP 28  . History of kidney stones   . History of nuclear stress test    a. Nuc 7/17: Overall, intermediate risk nuclear stress test secondary to small size of apical lateral defect and reduced ejection fraction.  EF 43%  . HLD (hyperlipidemia)   . HTN (hypertension)   . Injuries     crushing injury to both his feet in February 2010.   Marland Kitchen Kidney calculi   . Palpitations   . Tobacco abuse     Patient Active Problem List   Diagnosis Date Noted  . Below knee amputation status, left 01/25/2018  . Wound dehiscence, surgical   . Foot amputation  status, left 01/03/2018  . Status post left foot surgery 12/15/2017  . Snoring 06/07/2016  . Chest pain 01/22/2016  . Abnormal nuclear stress test 01/22/2016  . COPD (chronic obstructive pulmonary disease) ([place]) 01/27/2011  . Pre-operative cardiovascular examination 01/27/2011  . Nonspecific abnormal electrocardiogram (ECG) (EKG) 01/27/2011  . Murmur 01/27/2011  . DM2 (diabetes mellitus, type 2) ([place])   . HTN (hypertension)   . HLD (hyperlipidemia)   . Tobacco abuse     Past Surgical History:  Procedure Laterality Date  . AMPUTATION Left 01/03/2018   Procedure: LEFT MIDFOOT AMPUTATION/REVISION MIDAMPUTAION;  Surgeon: Mcarthur Rossetti, MD;  Location: [facility];  Service: Orthopedics;  Laterality: Left;  . AMPUTATION Left  01/25/2018   Procedure: LEFT BELOW KNEE AMPUTATION;  Surgeon: [name], MD;  Location: [facility];  Service: Orthopedics;  Laterality: Left;  . BELOW KNEE LEG AMPUTATION Left 01/25/2018  . CARDIAC CATHETERIZATION N/A 01/22/2016   Procedure: Left Heart Cath and Coronary Angiography;  Surgeon: Peter M Martinique, MD;  Location: [facility];  Service: Cardiovascular;  Laterality: N/A;  . FOOT AMPUTATION Bilateral   . I&D EXTREMITY Left 12/15/2017   Procedure: IRRIGATION AND DEBRIDEMENT LEFT FOOT ULCER;  Surgeon: Mcarthur Rossetti, MD;  Location: [facility];  Service: Orthopedics;  Laterality: Left;  . LITHOTRIPSY    . TENDON LENGTHENING Bilateral    calf  . TONSILLECTOMY          Home Medications    Prior to Admission medications   Medication Sig Start Date End Date Taking? Authorizing Provider  albuterol (PROVENTIL HFA;VENTOLIN HFA) 108 (90 Base) MCG/ACT inhaler Inhale 2 puffs into the lungs every 6 (six) hours as needed for wheezing or shortness of breath. 10/11/16   Collene Gobble, MD  amitriptyline (ELAVIL) 150 MG tablet Take 150 mg by mouth at bedtime.      [provider]  aspirin EC 81 MG tablet Take 1 tablet (81 mg total) by mouth daily. 11/28/15   Fay Records, MD  dapagliflozin propanediol (FARXIGA) 10 MG TABS tablet Take 10 mg by mouth daily.    [provider]  Dulaglutide (TRULICITY) 1.5 HF/4.1SE SOPN Inject 1.5 mg into the skin every 7 (seven) days. 0.5 ml = 1.5 mg 08/23/18   [provider]  furosemide (LASIX) 80 MG tablet Take 0.5 tablets (40 mg total) by mouth daily. Patient taking differently: Take 80 mg by mouth daily.  07/20/18   Bhagat, Crista Luria, PA  gabapentin (NEURONTIN) 300 MG capsule Take by mouth daily. Take 300 am, 600 pm by mouth 07/07/18   [provider]  glipiZIDE (GLUCOTROL XL) 10 MG 24 hr tablet Take 10 mg by mouth daily with breakfast.    [provider]  ipratropium (ATROVENT) 0.02 % nebulizer solution Take 2.5  mLs (0.5 mg total) by nebulization 4 (four) times daily. Patient taking differently: Take 0.5 mg by nebulization 4 (four) times daily as needed for wheezing or shortness of breath.  11/10/16   Parrett, Tammy S, NP  LORazepam (ATIVAN) 1 MG tablet Take 1 mg by mouth at bedtime.     [provider]  losartan (COZAAR) 50 MG tablet Take 1 tablet (50 mg total) by mouth daily. 07/03/18   Fay Records, MD  metFORMIN (GLUCOPHAGE-XR) 500 MG 24 hr tablet Take 1,000 mg by mouth 2 (two) times daily.     [provider]  metoprolol succinate (TOPROL-XL) 50 MG 24 hr tablet TAKE 1 TABLET (50 MG TOTAL)  BY MOUTH 2 (TWO) TIMES DAILY. TAKE WITH OR IMMEDIATELY FOLLOWING A MEAL. 10/26/18 01/24/19  Leanor Kail, PA  omeprazole (PRILOSEC) 40 MG capsule Take 40 mg by mouth daily.    [provider]  oxyCODONE-acetaminophen (PERCOCET) 10-325 MG tablet Take 1 tablet by mouth every 4 (four) hours as needed for pain. 10/14/18   [provider]  oxyCODONE-acetaminophen (PERCOCET/ROXICET) 5-325 MG tablet Take 1 tablet by mouth every 4 (four) hours as needed for severe pain. 01/31/18   [name], MD  potassium chloride (KLOR-CON) 8 MEQ tablet Take 1 tablet (8 mEq total) by mouth daily as needed (take only when take lasix). 07/20/18   Bhagat, Crista Luria, PA  simvastatin (ZOCOR) 40 MG tablet Take 40 mg by mouth every evening.     [provider]  tiZANidine (ZANAFLEX) 4 MG capsule Take 12 mg by mouth at bedtime.     [provider]  tiZANidine (ZANAFLEX) 4 MG tablet Take 4-12 mg by mouth at bedtime. 08/26/18   [provider]  traZODone (DESYREL) 50 MG tablet Take 50 mg by mouth at bedtime.     [provider]    Family History Family History  Problem Relation Age of Onset  . Leukemia Mother 12       died  . Lung cancer Father 61       died  . Heart attack Brother 44  . Heart attack Brother 80  . Hypertension Brother        X3  . Hypertension Sister         X23  . Diabetes Sister   . Stroke Sister   . Diabetes Sister   . Other Brother        Musician accident    Social History Social History   Tobacco Use  . Smoking status: Current Every Day Smoker    Packs/day: 0.50    Years: 40.00    Pack years: 20.00    Types: Cigarettes    Last attempt to quit: 01/23/2018    Years since quitting: 0.7  . Smokeless tobacco: Never Used  Substance Use Topics  . Alcohol use: No  . Drug use: No     Allergies   Hydromorphone hcl er; Tapentadol; and Exalamide   Review of Systems Review of Systems  Respiratory: Positive for shortness of breath (chronic per patient).   Neurological:       Tingling   All other systems reviewed and are negative.    Physical Exam Updated Vital Signs BP (!) 124/91   Pulse 98   Temp 98.1 F (36.7 C) (Oral)   Resp 17   Ht 5' 10"  (1.778 m)   Wt 97.5 kg   SpO2 96%   BMI 30.85 kg/m   Physical Exam Vitals signs and nursing note reviewed.  Constitutional:      General: He is not in acute distress.    Appearance: He is well-developed.     Comments: NAD.  HENT:     Head: Normocephalic and atraumatic.     Comments: Skin is normal to scalp     Right Ear: External ear normal.     Left Ear: External ear normal.     Nose: Nose normal.  Eyes:     General: No scleral icterus.    Conjunctiva/sclera: Conjunctivae normal.  Neck:     Musculoskeletal: Normal range of motion and neck supple.  Cardiovascular:     Rate and Rhythm: Regular rhythm. Tachycardia present.  Heart sounds: Normal heart sounds. No murmur.     Comments: HR 90-120 during exam, worse with getting out of bed, moving.  1+ radial pulse. 1+ DP RIGHT pulse, s/p LLE BKA no asymmetric thigh/calf edema or tenderness  Pulmonary:     Effort: Pulmonary effort is normal.     Breath sounds: Normal breath sounds. No wheezing.  Musculoskeletal: Normal range of motion.        General: No deformity.  Skin:    General: Skin is warm and dry.      Capillary Refill: Capillary refill takes less than 2 seconds.  Neurological:     Mental Status: He is alert and oriented to person, place, and time.     Comments: Alert and oriented to self, place, time and event.  Speech is fluent without dysarthria or dysphasia. Strength 5/5 with hand grip and ankle F/E.   Sensation to light touch intact in face, hands and feet. Normal gait No pronator drift. No leg drop. Normal finger-to-nose and feet tapping.  CN I not tested. CN II-XII intact bilaterally.  Psychiatric:        Behavior: Behavior normal.        Thought Content: Thought content normal.        Judgment: Judgment normal.      ED Treatments / Results  Labs (all labs ordered are listed, but only abnormal results are displayed) Labs Reviewed  APTT - Abnormal; Notable for the following components:      Result Value   aPTT 39 (*)    All other components within normal limits  CBC - Abnormal; Notable for the following components:   WBC 10.6 (*)    RBC 5.84 (*)    All other components within normal limits  COMPREHENSIVE METABOLIC PANEL - Abnormal; Notable for the following components:   Chloride 93 (*)    Glucose, Bld 383 (*)    All other components within normal limits  PROTIME-INR  DIFFERENTIAL  TSH  T4  TROPONIN I  RAPID URINE DRUG SCREEN, HOSP PERFORMED  I-STAT CREATININE, ED    EKG EKG Interpretation  Date/Time:  Thursday Oct 26 2018 15:55:43 EDT Ventricular Rate:  86 PR Interval:    QRS Duration: 84 QT Interval:  398 QTC Calculation: 476 R Axis:   72 Text Interpretation:  Age not entered, assumed to be  58 years old for purpose of ECG interpretation Sinus rhythm Atrial premature complex Abnormal T, consider ischemia, diffuse leads ST changes similar to prior. No STEMI.  Confirmed by Nanda Quinton [phone]) on 10/26/2018 4:05:09 PM   Radiology Dg Chest Portable 1 View  Result Date: 10/25/2018 CLINICAL DATA:  Shortness of breath EXAM: PORTABLE CHEST 1 VIEW  COMPARISON:  Chest x-ray dated 10/22/2018 FINDINGS: The cardiac silhouette remains mildly enlarged. A similar metallic foreign body projects over the patient's left lung apex. There is no pneumothorax. No large pleural effusion. An old fracture of the left clavicle is noted. IMPRESSION: No active disease. Electronically Signed   By: Constance Holster M.D.   On: 10/25/2018 17:57    Procedures Procedures (including critical care time)  Medications Ordered in ED Medications  sodium chloride flush (NS) 0.9 % injection 3 mL (has no administration in time range)     Initial Impression / Assessment and Plan / ED Course  I have reviewed the triage vital signs and the nursing notes.  Pertinent labs & imaging results that were available during my care of the patient were reviewed by me  and considered in my medical decision making (see chart for details).  Clinical Course as of Oct 26 1718  Thu Oct 26, 2018  1552 No new meds. No ETOH or illicit drugs    [CG]  1619 Discussed plan with patient including CTA, troponin, TSH to evaluate for his intermittent tachycardia 90-130s in the last 2 days.  Initially patient agreed but then became concerned about wait time.  States his wife and granddaughter are outside waiting for him. He lives 86 miles away and does not have any other ride to/from hospital.  Discussed risk of leaving AMA including undiagnosed PE, strain on his heart, thyroid abnormalities and potentially life threatening etiology. He was able to to verbalized exactly what we were concerned about "my heart rate is high and I need a CT scan and blood for my heart".  He seems to understand the risks well. I offered to call his wife to explain the situation but he did not want me to do this.  He will leave AMA again today.    [CG]    Clinical Course User Index [CG] Kinnie Feil, PA-C       Scalp tingling x 30 seconds with benign history and normal neuro exam.  No other associated symptoms.  No overlaying skin changes.  No significant HTN here. Screening labs unremarkable.  No anticoagulants.  History and exam not consistent with hemorrhage, ischemic stroke, TIA.  Etiology is unclear, ddx includes atypical migraine vs medication related.  He took percocet before symptoms began.  I see no indication for further emergent labs, CNS imaging here.    Patient noted to be tachycardic in ED.  He is a challenging historian.  Concern there may be some poor medical literacy.  This is his second ED visit in 48 hours.  Yesterday for Sob and palpitations per ED physician and triage notes but patient today adamantly denies this, states he did not come to ED for SOB/palpitations yesterday. His HR has been 90-130s while in the ED, sinus tachycardia. Reports some long standing exertional dyspnea when he walks 1 mile, but no CP and asymptomatic tachycardia otherwise.  Chart review shows his cardiologists has addressed this 06/2018 and rx metoprolol which patient states he is compliant with.  Given persistent tachycardia we recommended cardiac enzymes, thyroid panel and CTA to r/o PE.  He denies sympathomimetic drug use.  No signs of significant dehydration. IInitially patient agreed to ED work up but then declined and left AMA again today because he did not want this wife/granddaughter to wait outside ED room.  I offered to call his wife but he declined.  Patient has left AMA again. Lengthy discussion about risks of leaving.  He understands the risks.  Sinus tachycardia may not be new to him (per chart review) but may need medication adjustment. He promises to call his cardiologist tomorrow to schedule a f/u.  He was encouraged to return to ED at any time for re-evaluation. Discussed with EDMD.   Final Clinical Impressions(s) / ED Diagnoses   Final diagnoses:  Left against medical advice  Tachycardia  Exertional dyspnea    ED Discharge Orders    None       Kinnie Feil, PA-C 10/26/18 1721     LongWonda Olds, MD 11/01/18 [phone]

## 2019-01-19 ENCOUNTER — Telehealth: Payer: Self-pay

## 2019-01-19 NOTE — Telephone Encounter (Signed)

## 2019-01-22 ENCOUNTER — Ambulatory Visit: Payer: Medicare HMO | Admitting: Internal Medicine

## 2019-02-22 ENCOUNTER — Encounter: Payer: Self-pay | Admitting: Orthopaedic Surgery

## 2019-02-22 ENCOUNTER — Ambulatory Visit (INDEPENDENT_AMBULATORY_CARE_PROVIDER_SITE_OTHER): Payer: Medicare HMO | Admitting: Orthopaedic Surgery

## 2019-02-22 DIAGNOSIS — L97513 Non-pressure chronic ulcer of other part of right foot with necrosis of muscle: Secondary | ICD-10-CM | POA: Diagnosis not present

## 2019-02-22 MED ORDER — DOXYCYCLINE HYCLATE 100 MG PO TABS: 100.0000 mg | ORAL_TABLET | Freq: Two times a day (BID) | ORAL | 0 refills | Status: DC

## 2019-02-22 NOTE — Progress Notes (Signed)
Office Visit Note   Patient: Troy [name]           Date of Birth: March 15, 1961           MRN: [id number] Visit Date: 02/22/2019              Requested by: [name], MD [address] [place],  [place] [id number] PCP: [name], MD   Assessment & Plan: Visit Diagnoses:  1. Skin ulcer of second toe of right foot with necrosis of muscle ([place])     Plan: After debriding the end of his toe in the office, I will have him soak his foot daily and antibacterial soapy water for about 15 minutes.  He will then dried off and place a small amount of Bactroban ointment on the tip of his toe followed by Band-Aids.  I like to see him back in 2 weeks.  At that visit I would like an AP and lateral of the right foot second toe.  I am also going to start him on doxycycline.  Follow-Up Instructions: Return in about 2 weeks (around 03/08/2019).   Orders:  No orders of the defined types were placed in this encounter.  Meds ordered this encounter  Medications  . doxycycline (VIBRA-TABS) 100 MG tablet    Sig: Take 1 tablet (100 mg total) by mouth 2 (two) times daily.    Dispense:  30 tablet    Refill:  0      Procedures: No procedures performed   Clinical Data: No additional findings.   Subjective: Chief Complaint  Patient presents with  . Right Foot - Wound Check  The patient is well-known to [name].  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  The trauma to his right foot caused him to have to have a first ray amputation.  His wife brought him in today out of concern of his second toe and some darkness at the end of the second toe and a small wound.  The patient is a diabetic and has had some diabetic control issues in the past.  He has not been compliant with any medications as of recent according  to his wife.  HPI  Review of Systems He currently denies any headache, chest pain, shortness of breath, fever, chills, nausea, vomiting  Objective: Vital Signs: There were no vitals taken for this visit.  Physical Exam He is alert and orient x3 and in no acute distress Ortho Exam Examination of his right foot does show a dark eschar at the tip of the second toe.  I was able to use a 15 blade to debride this down almost to the bone.  There is a slight evidence of infection.  There is no redness. Specialty Comments:  No specialty comments available.  Imaging: No results found.   PMFS History: Patient Active Problem List   Diagnosis Date Noted  . Below knee amputation status, left 01/25/2018  . Wound dehiscence, surgical   . Foot amputation status, left 01/03/2018  . Status post left foot surgery 12/15/2017  . Snoring 06/07/2016  . Chest pain 01/22/2016  . Abnormal nuclear stress test 01/22/2016  . COPD (chronic obstructive pulmonary disease) ([place]) 01/27/2011  . Pre-operative cardiovascular examination 01/27/2011  . Nonspecific abnormal electrocardiogram (ECG) (EKG) 01/27/2011  . Murmur 01/27/2011  . DM2 (  diabetes mellitus, type 2) ([place])   . HTN (hypertension)   . HLD (hyperlipidemia)   . Tobacco abuse    Past Medical History:  Diagnosis Date  . Acute renal failure ([place])    in setting of NSAID use and orthopedic surgery 2010  . Anxiety and depression   . Chronic diastolic CHF (congestive heart failure) ([place])    a. Echo 6/17: severe conc LVH, vigorous EF, EF 65-70%, no dynamic obstruction, no RWMA, Gr 1 DD, mild TR  //  b. LHC 8/17: no sig CAD, LVEDP 28  . COPD (chronic obstructive pulmonary disease) ([place])   . Diabetic ulcer of left foot ([place])   . DM2 (diabetes mellitus, type 2) ([place])   . Family history of early CAD   . Fatty liver   . GERD (gastroesophageal reflux disease)   . History of amputation of foot ([place])    L trans-met // R toe  . History of cardiac  catheterization    a. [place] 2002: irregs  //  b. LHC in 8/17: no sig CAD, apical DK, hyperdynamic LV, LVEDP 28  . History of kidney stones   . History of nuclear stress test    a. Nuc 7/17: Overall, intermediate risk nuclear stress test secondary to small size of apical lateral defect and reduced ejection fraction.  EF 43%  . HLD (hyperlipidemia)   . HTN (hypertension)   . Injuries     crushing injury to both his feet in February 2010.   Marland Kitchen Kidney calculi   . Palpitations   . Tobacco abuse     Family History  Problem Relation Age of Onset  . Leukemia Mother 14       died  . Lung cancer Father 43       died  . Heart attack Brother 29  . Heart attack Brother 82  . Hypertension Brother        X3  . Hypertension Sister        X50  . Diabetes Sister   . Stroke Sister   . Diabetes Sister   . Other Brother        Musician accident    Past Surgical History:  Procedure Laterality Date  . AMPUTATION Left 01/03/2018   Procedure: LEFT MIDFOOT AMPUTATION/REVISION MIDAMPUTAION;  Surgeon: Mcarthur Rossetti, MD;  Location: [facility];  Service: Orthopedics;  Laterality: Left;  . AMPUTATION Left 01/25/2018   Procedure: LEFT BELOW KNEE AMPUTATION;  Surgeon: [name], MD;  Location: [facility];  Service: Orthopedics;  Laterality: Left;  . BELOW KNEE LEG AMPUTATION Left 01/25/2018  . CARDIAC CATHETERIZATION N/A 01/22/2016   Procedure: Left Heart Cath and Coronary Angiography;  Surgeon: Peter M Martinique, MD;  Location: [facility];  Service: Cardiovascular;  Laterality: N/A;  . FOOT AMPUTATION Bilateral   . I&D EXTREMITY Left 12/15/2017   Procedure: IRRIGATION AND DEBRIDEMENT LEFT FOOT ULCER;  Surgeon: Mcarthur Rossetti, MD;  Location[facility];  Service: Orthopedics;  Laterality: Left;  . LITHOTRIPSY    . TENDON LENGTHENING Bilateral    calf  . TONSILLECTOMY     Social History   Occupational History  . Occupation: DISABLED  Tobacco Use  . Smoking status: Current Every Day Smoker     Packs/day: 0.50    Years: 40.00    Pack years: 20.00    Types: Cigarettes    Last attempt to quit: 01/23/2018    Years since quitting: 1.0  . Smokeless tobacco: Never Used  Substance and Sexual Activity  . Alcohol use: No  . Drug use: No  . Sexual activity: Not on file

## 2019-03-08 ENCOUNTER — Ambulatory Visit: Payer: Medicare HMO | Admitting: Orthopaedic Surgery

## 2019-03-13 ENCOUNTER — Ambulatory Visit: Payer: Medicare HMO | Admitting: Orthopaedic Surgery

## 2019-03-22 ENCOUNTER — Ambulatory Visit: Payer: Medicare HMO | Admitting: Internal Medicine

## 2019-04-30 ENCOUNTER — Ambulatory Visit: Payer: Medicare HMO | Admitting: Orthopedic Surgery

## 2019-05-03 ENCOUNTER — Ambulatory Visit (INDEPENDENT_AMBULATORY_CARE_PROVIDER_SITE_OTHER): Payer: Medicare HMO | Admitting: Orthopedic Surgery

## 2019-05-03 ENCOUNTER — Encounter: Payer: Self-pay | Admitting: Orthopedic Surgery

## 2019-05-03 ENCOUNTER — Other Ambulatory Visit: Payer: Self-pay

## 2019-05-03 VITALS — Ht 70.0 in | Wt 215.0 lb

## 2019-05-03 DIAGNOSIS — Z89512 Acquired absence of left leg below knee: Secondary | ICD-10-CM | POA: Diagnosis not present

## 2019-05-03 DIAGNOSIS — S88112A Complete traumatic amputation at level between knee and ankle, left lower leg, initial encounter: Secondary | ICD-10-CM

## 2019-05-03 NOTE — Progress Notes (Signed)
Office Visit Note   Patient: Troy [name]           Date of Birth: [birth year]           MRN: [id number] Visit Date: 05/03/2019              Requested by: [name], MD [id number] [place],  [place] [id number] PCP: [name], MD  Chief Complaint  Patient presents with  . Left Leg - Follow-up    LBKA f/u      HPI: Patient is a 58 year old gentleman who presents for evaluation for his left transtibial amputation patient states that he is started developing some end bearing pressure breakdown for the past week on the residual limb on the left.  Patient states he has had multiple modifications of the socket with padding he is wearing over 15 ply sock in the socket is 58 years old and is an bearing.  Assessment & Plan: Visit Diagnoses:  1. Below-knee amputation of left lower extremity ([place])     Plan: Plan: Patient was given a prescription for new socket new liner new materials and supplies.  Discussed that if this ulcer breaks down further he should follow-up immediately.  Follow-Up Instructions: Return if symptoms worsen or fail to improve.   Ortho Exam  Patient is alert, oriented, no adenopathy, well-dressed, normal affect, normal respiratory effort. Examination patient is developing an end bearing ulcer over the residual limb on the left this is an area of about 10 mm in diameter.  Patient has no rotational stability with the left socket he is currently wearing 15 ply sock and has had modifications made with in the socket which are still not providing a good enough fit.  Imaging: No results found. No images are attached to the encounter.  Labs: Lab Results  Component Value Date   HGBA1C 11.0 (H) 12/13/2017     Lab Results  Component Value Date   ALBUMIN 4.0 10/26/2018   ALBUMIN 4.2 10/22/2018   ALBUMIN 3.8 05/09/2011    Lab Results  Component Value Date   MG 1.4 (L) 10/22/2018   MG 1.9 08/14/2008   MG 2.2 08/11/2008   No results found for:  VD25OH  No results found for: PREALBUMIN CBC EXTENDED Latest Ref Rng & Units 10/26/2018 10/25/2018 10/22/2018  WBC 4.0 - 10.5 K/uL 10.6(H) 12.7(H) 12.0(H)  RBC 4.22 - 5.81 MIL/uL 5.84(H) 6.11(H) 5.50  HGB 13.0 - 17.0 g/dL 16.3 17.2(H) 15.5  HCT 39.0 - 52.0 % 50.2 52.7(H) 46.7  PLT 150 - 400 K/uL 220 255 193  NEUTROABS 1.7 - 7.7 K/uL 7.0 - 7.8(H)  LYMPHSABS 0.7 - 4.0 K/uL 2.7 - 2.8     Body mass index is 30.85 kg/m.  Orders:  No orders of the defined types were placed in this encounter.  No orders of the defined types were placed in this encounter.    Procedures: No procedures performed  Clinical Data: No additional findings.  ROS:  All other systems negative, except as noted in the HPI. Review of Systems  Objective: Vital Signs: Ht 5' 10"  (1.778 m)   Wt 215 lb (97.5 kg)   BMI 30.85 kg/m   Specialty Comments:  No specialty comments available.  PMFS History: Patient Active Problem List   Diagnosis Date Noted  . Below knee amputation status, left 01/25/2018  . Wound dehiscence, surgical   . Foot amputation status, left 01/03/2018  . Status post left foot surgery 12/15/2017  . Snoring 06/07/2016  .  Chest pain 01/22/2016  . Abnormal nuclear stress test 01/22/2016  . COPD (chronic obstructive pulmonary disease) ([place]) 01/27/2011  . Pre-operative cardiovascular examination 01/27/2011  . Nonspecific abnormal electrocardiogram (ECG) (EKG) 01/27/2011  . Murmur 01/27/2011  . DM2 (diabetes mellitus, type 2) ([place])   . HTN (hypertension)   . HLD (hyperlipidemia)   . Tobacco abuse    Past Medical History:  Diagnosis Date  . Acute renal failure ([place])    in setting of NSAID use and orthopedic surgery 2010  . Anxiety and depression   . Chronic diastolic CHF (congestive heart failure) ([place])    a. Echo 6/17: severe conc LVH, vigorous EF, EF 65-70%, no dynamic obstruction, no RWMA, Gr 1 DD, mild TR  //  b. LHC 8/17: no sig CAD, LVEDP 28  . COPD (chronic obstructive pulmonary  disease) ([place])   . Diabetic ulcer of left foot ([place])   . DM2 (diabetes mellitus, type 2) ([place])   . Family history of early CAD   . Fatty liver   . GERD (gastroesophageal reflux disease)   . History of amputation of foot ([place])    L trans-met // R toe  . History of cardiac catheterization    a. [place] 2002: irregs  //  b. LHC in 8/17: no sig CAD, apical DK, hyperdynamic LV, LVEDP 28  . History of kidney stones   . History of nuclear stress test    a. Nuc 7/17: Overall, intermediate risk nuclear stress test secondary to small size of apical lateral defect and reduced ejection fraction.  EF 43%  . HLD (hyperlipidemia)   . HTN (hypertension)   . Injuries     crushing injury to both his feet in February 2010.   Marland Kitchen Kidney calculi   . Palpitations   . Tobacco abuse     Family History  Problem Relation Age of Onset  . Leukemia Mother 3       died  . Lung cancer Father 57       died  . Heart attack Brother 54  . Heart attack Brother 77  . Hypertension Brother        X3  . Hypertension Sister        X36  . Diabetes Sister   . Stroke Sister   . Diabetes Sister   . Other Brother        Musician accident    Past Surgical History:  Procedure Laterality Date  . AMPUTATION Left 01/03/2018   Procedure: LEFT MIDFOOT AMPUTATION/REVISION MIDAMPUTAION;  Surgeon: Mcarthur Rossetti, MD;  Location: [facility];  Service: Orthopedics;  Laterality: Left;  . AMPUTATION Left 01/25/2018   Procedure: LEFT BELOW KNEE AMPUTATION;  Surgeon: [name], MD;  Location: [facility];  Service: Orthopedics;  Laterality: Left;  . BELOW KNEE LEG AMPUTATION Left 01/25/2018  . CARDIAC CATHETERIZATION N/A 01/22/2016   Procedure: Left Heart Cath and Coronary Angiography;  Surgeon: Peter M Martinique, MD;  Location: [facility];  Service: Cardiovascular;  Laterality: N/A;  . FOOT AMPUTATION Bilateral   . I&D EXTREMITY Left 12/15/2017   Procedure: IRRIGATION AND DEBRIDEMENT LEFT FOOT ULCER;  Surgeon: Mcarthur Rossetti,  MD;  Location: [facility];  Service: Orthopedics;  Laterality: Left;  . LITHOTRIPSY    . TENDON LENGTHENING Bilateral    calf  . TONSILLECTOMY     Social History   Occupational History  . Occupation: DISABLED  Tobacco Use  . Smoking status: Current Every Day Smoker  Packs/day: 0.50    Years: 40.00    Pack years: 20.00    Types: Cigarettes    Last attempt to quit: 01/23/2018    Years since quitting: 1.2  . Smokeless tobacco: Never Used  Substance and Sexual Activity  . Alcohol use: No  . Drug use: No  . Sexual activity: Not on file

## 2019-06-27 ENCOUNTER — Ambulatory Visit: Payer: Medicare HMO | Admitting: Orthopaedic Surgery

## 2019-07-12 ENCOUNTER — Encounter: Payer: Self-pay | Admitting: Orthopaedic Surgery

## 2019-07-12 ENCOUNTER — Ambulatory Visit (INDEPENDENT_AMBULATORY_CARE_PROVIDER_SITE_OTHER): Payer: Medicare HMO | Admitting: Orthopaedic Surgery

## 2019-07-12 ENCOUNTER — Other Ambulatory Visit: Payer: Self-pay

## 2019-07-12 DIAGNOSIS — M869 Osteomyelitis, unspecified: Secondary | ICD-10-CM | POA: Diagnosis not present

## 2019-07-12 HISTORY — DX: Osteomyelitis, unspecified: M86.9

## 2019-07-12 NOTE — Progress Notes (Signed)
Troy [name] is well-known to me.  He has a history of a left below-knee amputation.  He also had a crushing injury to his right foot and has a first ray resection.  He is a diabetic and has had diabetic control issues for some time.  He has developed blistering and ulceration of his second toe on the right foot.  This has gotten worse.    On exam today, the right foot second toe has ulcerations.  It is dusky appearing and has purulent drainage and a foul odor.  This point we are recommending a right second toe amputation through the MTP joint.  He understands this fully and was anticipating that.  The risk and benefits of surgery were explained to him in detail.  We did counsel him about maintaining good diabetic control.  Given the likely osteomyelitis, we will need to proceed with surgery at least next week as an outpatient for a right second toe amputation.  All questions and concerns were answered and addressed.  We will work on getting scheduled.  In the interim he will try to keep the second toe on his right foot clean and dry.

## 2019-07-13 ENCOUNTER — Encounter (HOSPITAL_BASED_OUTPATIENT_CLINIC_OR_DEPARTMENT_OTHER): Payer: Self-pay | Admitting: Orthopaedic Surgery

## 2019-07-13 ENCOUNTER — Encounter (HOSPITAL_BASED_OUTPATIENT_CLINIC_OR_DEPARTMENT_OTHER)
Admission: RE | Admit: 2019-07-13 | Discharge: 2019-07-13 | Disposition: A | Discharge status: Home / Self Care | Payer: Medicare HMO | Source: Ambulatory Visit | Attending: Orthopaedic Surgery | Admitting: Orthopaedic Surgery

## 2019-07-13 ENCOUNTER — Encounter (HOSPITAL_COMMUNITY): Payer: Self-pay | Admitting: Emergency Medicine

## 2019-07-13 ENCOUNTER — Emergency Department (HOSPITAL_COMMUNITY)
Admission: EM | Admit: 2019-07-13 | Discharge: 2019-07-13 | Disposition: A | Discharge status: Home / Self Care | Payer: Medicare HMO | Attending: Emergency Medicine | Admitting: Emergency Medicine

## 2019-07-13 ENCOUNTER — Other Ambulatory Visit: Payer: Self-pay

## 2019-07-13 ENCOUNTER — Other Ambulatory Visit (HOSPITAL_COMMUNITY)
Admission: RE | Admit: 2019-07-13 | Discharge: 2019-07-13 | Disposition: A | Discharge status: Home / Self Care | Payer: Medicare HMO | Source: Ambulatory Visit | Attending: Orthopaedic Surgery | Admitting: Orthopaedic Surgery

## 2019-07-13 ENCOUNTER — Telehealth: Payer: Self-pay

## 2019-07-13 ENCOUNTER — Emergency Department (HOSPITAL_COMMUNITY): Payer: Medicare HMO

## 2019-07-13 DIAGNOSIS — K760 Fatty (change of) liver, not elsewhere classified: Secondary | ICD-10-CM | POA: Insufficient documentation

## 2019-07-13 DIAGNOSIS — F329 Major depressive disorder, single episode, unspecified: Secondary | ICD-10-CM | POA: Diagnosis not present

## 2019-07-13 DIAGNOSIS — Z01812 Encounter for preprocedural laboratory examination: Secondary | ICD-10-CM | POA: Insufficient documentation

## 2019-07-13 DIAGNOSIS — Z833 Family history of diabetes mellitus: Secondary | ICD-10-CM | POA: Diagnosis not present

## 2019-07-13 DIAGNOSIS — E785 Hyperlipidemia, unspecified: Secondary | ICD-10-CM | POA: Insufficient documentation

## 2019-07-13 DIAGNOSIS — Z89512 Acquired absence of left leg below knee: Secondary | ICD-10-CM | POA: Diagnosis not present

## 2019-07-13 DIAGNOSIS — K219 Gastro-esophageal reflux disease without esophagitis: Secondary | ICD-10-CM | POA: Diagnosis not present

## 2019-07-13 DIAGNOSIS — Z7984 Long term (current) use of oral hypoglycemic drugs: Secondary | ICD-10-CM | POA: Insufficient documentation

## 2019-07-13 DIAGNOSIS — M869 Osteomyelitis, unspecified: Secondary | ICD-10-CM | POA: Diagnosis not present

## 2019-07-13 DIAGNOSIS — J449 Chronic obstructive pulmonary disease, unspecified: Secondary | ICD-10-CM | POA: Diagnosis not present

## 2019-07-13 DIAGNOSIS — L089 Local infection of the skin and subcutaneous tissue, unspecified: Secondary | ICD-10-CM | POA: Diagnosis not present

## 2019-07-13 DIAGNOSIS — Z7982 Long term (current) use of aspirin: Secondary | ICD-10-CM | POA: Diagnosis not present

## 2019-07-13 DIAGNOSIS — Z87442 Personal history of urinary calculi: Secondary | ICD-10-CM | POA: Diagnosis not present

## 2019-07-13 DIAGNOSIS — I96 Gangrene, not elsewhere classified: Secondary | ICD-10-CM | POA: Diagnosis not present

## 2019-07-13 DIAGNOSIS — Z79899 Other long term (current) drug therapy: Secondary | ICD-10-CM | POA: Insufficient documentation

## 2019-07-13 DIAGNOSIS — E1152 Type 2 diabetes mellitus with diabetic peripheral angiopathy with gangrene: Secondary | ICD-10-CM | POA: Diagnosis not present

## 2019-07-13 DIAGNOSIS — Z8249 Family history of ischemic heart disease and other diseases of the circulatory system: Secondary | ICD-10-CM | POA: Diagnosis not present

## 2019-07-13 DIAGNOSIS — Z20822 Contact with and (suspected) exposure to covid-19: Secondary | ICD-10-CM | POA: Insufficient documentation

## 2019-07-13 DIAGNOSIS — R739 Hyperglycemia, unspecified: Secondary | ICD-10-CM

## 2019-07-13 DIAGNOSIS — Z89411 Acquired absence of right great toe: Secondary | ICD-10-CM | POA: Insufficient documentation

## 2019-07-13 DIAGNOSIS — I110 Hypertensive heart disease with heart failure: Secondary | ICD-10-CM | POA: Diagnosis not present

## 2019-07-13 DIAGNOSIS — F17210 Nicotine dependence, cigarettes, uncomplicated: Secondary | ICD-10-CM | POA: Insufficient documentation

## 2019-07-13 DIAGNOSIS — E1165 Type 2 diabetes mellitus with hyperglycemia: Secondary | ICD-10-CM | POA: Insufficient documentation

## 2019-07-13 DIAGNOSIS — I5032 Chronic diastolic (congestive) heart failure: Secondary | ICD-10-CM | POA: Diagnosis not present

## 2019-07-13 DIAGNOSIS — E11621 Type 2 diabetes mellitus with foot ulcer: Secondary | ICD-10-CM | POA: Diagnosis not present

## 2019-07-13 DIAGNOSIS — Z9114 Patient's other noncompliance with medication regimen: Secondary | ICD-10-CM | POA: Diagnosis not present

## 2019-07-13 DIAGNOSIS — F419 Anxiety disorder, unspecified: Secondary | ICD-10-CM | POA: Diagnosis not present

## 2019-07-13 LAB — URINALYSIS, ROUTINE W REFLEX MICROSCOPIC
Bacteria, UA: NONE SEEN
Bilirubin Urine: NEGATIVE
Glucose, UA: >=500 mg/dL — AB
Hgb urine dipstick: NEGATIVE
Ketones, ur: 5 mg/dL — AB
Leukocytes,Ua: NEGATIVE
Nitrite: NEGATIVE
Protein, ur: 30 mg/dL — AB
Specific Gravity, Urine: 1.041 — ABNORMAL HIGH (ref 1.005–1.030)
pH: 6 (ref 5.0–8.0)

## 2019-07-13 LAB — CBC
HCT: 47.1 % (ref 39.0–52.0)
Hemoglobin: 16.1 g/dL (ref 13.0–17.0)
MCH: 28.9 pg (ref 26.0–34.0)
MCHC: 34.2 g/dL (ref 30.0–36.0)
MCV: 84.4 fL (ref 80.0–100.0)
Platelets: 213 10*3/uL (ref 150–400)
RBC: 5.58 MIL/uL (ref 4.22–5.81)
RDW: 12.5 % (ref 11.5–15.5)
WBC: 15.5 10*3/uL — ABNORMAL HIGH (ref 4.0–10.5)
nRBC: 0 % (ref 0.0–0.2)

## 2019-07-13 LAB — BASIC METABOLIC PANEL
Anion gap: 18 — ABNORMAL HIGH (ref 5–15)
Anion gap: 16 — ABNORMAL HIGH (ref 5–15)
BUN: 9 mg/dL (ref 6–20)
BUN: 10 mg/dL (ref 6–20)
CO2: 20 mmol/L — ABNORMAL LOW (ref 22–32)
CO2: 22 mmol/L (ref 22–32)
Calcium: 9 mg/dL (ref 8.9–10.3)
Calcium: 9.5 mg/dL (ref 8.9–10.3)
Chloride: 90 mmol/L — ABNORMAL LOW (ref 98–111)
Chloride: 94 mmol/L — ABNORMAL LOW (ref 98–111)
Creatinine, Ser: 0.88 mg/dL (ref 0.61–1.24)
Creatinine, Ser: 0.6 mg/dL — ABNORMAL LOW (ref 0.61–1.24)
GFR calc Af Amer: >60 mL/min (ref >60)
GFR calc Af Amer: >60 mL/min (ref >60)
GFR calc non Af Amer: >60 mL/min (ref >60)
GFR calc non Af Amer: >60 mL/min (ref >60)
Glucose, Bld: 574 mg/dL (ref 70–99)
Glucose, Bld: 294 mg/dL — ABNORMAL HIGH (ref 70–99)
Potassium: 3.4 mmol/L — ABNORMAL LOW (ref 3.5–5.1)
Potassium: 3.2 mmol/L — ABNORMAL LOW (ref 3.5–5.1)
Sodium: 128 mmol/L — ABNORMAL LOW (ref 135–145)
Sodium: 132 mmol/L — ABNORMAL LOW (ref 135–145)

## 2019-07-13 LAB — CBG MONITORING, ED
Glucose-Capillary: 293 mg/dL — ABNORMAL HIGH (ref 70–99)
Glucose-Capillary: 237 mg/dL — ABNORMAL HIGH (ref 70–99)
Glucose-Capillary: 188 mg/dL — ABNORMAL HIGH (ref 70–99)

## 2019-07-13 LAB — SARS CORONAVIRUS 2 (TAT 6-24 HRS): SARS Coronavirus 2: NEGATIVE

## 2019-07-13 MED ORDER — POTASSIUM CHLORIDE CRYS ER 20 MEQ PO TBCR
40.0000 meq | EXTENDED_RELEASE_TABLET | Freq: Once | ORAL | Status: AC
  Administered 2019-07-13: 40 meq via ORAL
  Filled 2019-07-13: qty 2

## 2019-07-13 MED ORDER — VANCOMYCIN HCL IN DEXTROSE 1-5 GM/200ML-% IV SOLN: 1000.0000 mg | Freq: Once | INTRAVENOUS | Status: DC

## 2019-07-13 MED ORDER — VANCOMYCIN HCL 1500 MG/300ML IV SOLN
1500.0000 mg | Freq: Once | INTRAVENOUS | Status: AC
  Administered 2019-07-13: 1500 mg via INTRAVENOUS
  Filled 2019-07-13: qty 300

## 2019-07-13 MED ORDER — SODIUM CHLORIDE 0.9 % IV BOLUS
1000.0000 mL | Freq: Once | INTRAVENOUS | Status: AC
  Administered 2019-07-13: 1000 mL via INTRAVENOUS

## 2019-07-13 MED ORDER — SODIUM CHLORIDE 0.9 % IV BOLUS
1000.0000 mL | Freq: Once | INTRAVENOUS | Status: AC
  Administered 2019-07-13: 20:00:00 1000 mL via INTRAVENOUS

## 2019-07-13 MED ORDER — INSULIN ASPART 100 UNIT/ML ~~LOC~~ SOLN
3.0000 [IU] | Freq: Once | SUBCUTANEOUS | Status: AC
  Administered 2019-07-13: 20:00:00 3 [IU] via INTRAVENOUS

## 2019-07-13 NOTE — Progress Notes (Signed)
Troy [name], notified [name] at Dr. Trevor Mace office, Dr. Fransisco Beau (anesthesia) states pt will need to see PCP prior to surgery at [facility]. Notified pt of blood glucose and instructed pt to go to ED immediately. Pt verbalized understanding.

## 2019-07-13 NOTE — ED Triage Notes (Signed)
Pt from PCP with cbg readings >600. Pt denies chest pain, vision changes, urinary frequency. Pt a/o x4. NAD cbg at triage 293

## 2019-07-13 NOTE — Discharge Instructions (Addendum)
As we discussed, you need to show up for your surgery tomorrow as previously scheduled.  Return to the emergency department for any chest pain, difficulty breathing, abdominal pain, fevers or any other worsening concerning symptoms.

## 2019-07-13 NOTE — ED Provider Notes (Signed)
[facility] EMERGENCY DEPARTMENT Provider Note   CSN: [id number] Arrival date & time: 07/13/19  1441     History Chief Complaint  Patient presents with  . Hyperglycemia    Troy [name] is a 59 y.o. male past medical histories of CHF, diabetes, GERD, hyperlipidemia, hypertension who presents for evaluation of high blood sugar.  Patient reports that he has a history of diabetes and states that he was at home and noted his blood sugar to be in the 500s.  He states that his wife prompted him to come to the emergency department.  Triage note initially said that PCP told him to come but he states he has not talked to his PCP today.  He is scheduled to have a right second digit amputation tomorrow.  He was seen by Dr. Ninfa Linden yesterday.  He states that since seeing him, he feels like it is gotten slightly worse, more swollen, more red.  He has not noted any fevers.  He states he has been taking his diabetes medications as directed and has not missed any doses.  He denies any chest pain, difficulty breathing, vision changes, increased urinary frequency.  The history is provided by the patient.       Past Medical History:  Diagnosis Date  . Acute renal failure ([place])    in setting of NSAID use and orthopedic surgery 2010  . Anxiety and depression   . Chronic diastolic CHF (congestive heart failure) ([place])    a. Echo 6/17: severe conc LVH, vigorous EF, EF 65-70%, no dynamic obstruction, no RWMA, Gr 1 DD, mild TR  //  b. LHC 8/17: no sig CAD, LVEDP 28  . COPD (chronic obstructive pulmonary disease) ([place])   . Diabetic ulcer of left foot ([place])   . DM2 (diabetes mellitus, type 2) ([place])   . Family history of early CAD   . Fatty liver   . GERD (gastroesophageal reflux disease)   . History of amputation of foot ([place])    L trans-met // R toe  . History of cardiac catheterization    a. [place] 2002: irregs  //  b. LHC in 8/17: no sig CAD, apical DK, hyperdynamic LV, LVEDP 28  .  History of kidney stones   . History of nuclear stress test    a. Nuc 7/17: Overall, intermediate risk nuclear stress test secondary to small size of apical lateral defect and reduced ejection fraction.  EF 43%  . HLD (hyperlipidemia)   . HTN (hypertension)   . Injuries     crushing injury to both his feet in February 2010.   Marland Kitchen Kidney calculi   . Palpitations   . Tobacco abuse     Patient Active Problem List   Diagnosis Date Noted  . Osteomyelitis of second toe of right foot ([place]) 07/12/2019  . Below knee amputation status, left 01/25/2018  . Wound dehiscence, surgical   . Foot amputation status, left 01/03/2018  . Status post left foot surgery 12/15/2017  . Snoring 06/07/2016  . Chest pain 01/22/2016  . Abnormal nuclear stress test 01/22/2016  . COPD (chronic obstructive pulmonary disease) ([place]) 01/27/2011  . Pre-operative cardiovascular examination 01/27/2011  . Nonspecific abnormal electrocardiogram (ECG) (EKG) 01/27/2011  . Murmur 01/27/2011  . DM2 (diabetes mellitus, type 2) ([place])   . HTN (hypertension)   . HLD (hyperlipidemia)   . Tobacco abuse     Past Surgical History:  Procedure Laterality Date  . AMPUTATION Left 01/03/2018   Procedure:  LEFT MIDFOOT AMPUTATION/REVISION MIDAMPUTAION;  Surgeon: Mcarthur Rossetti, MD;  Location: [facility];  Service: Orthopedics;  Laterality: Left;  . AMPUTATION Left 01/25/2018   Procedure: LEFT BELOW KNEE AMPUTATION;  Surgeon: [name], MD;  Location: [facility];  Service: Orthopedics;  Laterality: Left;  . BELOW KNEE LEG AMPUTATION Left 01/25/2018  . CARDIAC CATHETERIZATION N/A 01/22/2016   Procedure: Left Heart Cath and Coronary Angiography;  Surgeon: Peter M Martinique, MD;  Location: [facility];  Service: Cardiovascular;  Laterality: N/A;  . FOOT AMPUTATION Bilateral   . I & D EXTREMITY Left 12/15/2017   Procedure: IRRIGATION AND DEBRIDEMENT LEFT FOOT ULCER;  Surgeon: Mcarthur Rossetti, MD;  [facility];  Service:  Orthopedics;  Laterality: Left;  . LITHOTRIPSY    . TENDON LENGTHENING Bilateral    calf  . TONSILLECTOMY         Family History  Problem Relation Age of Onset  . Leukemia Mother 1       died  . Lung cancer Father 75       died  . Heart attack Brother 45  . Heart attack Brother 17  . Hypertension Brother        X3  . Hypertension Sister        X65  . Diabetes Sister   . Stroke Sister   . Diabetes Sister   . Other Brother        Musician accident    Social History   Tobacco Use  . Smoking status: Current Every Day Smoker    Packs/day: 0.50    Years: 40.00    Pack years: 20.00    Types: Cigarettes    Last attempt to quit: 01/23/2018    Years since quitting: 1.4  . Smokeless tobacco: Never Used  Substance Use Topics  . Alcohol use: No  . Drug use: No    Home Medications Prior to Admission medications   Medication Sig Start Date End Date Taking? Authorizing Provider  albuterol (PROVENTIL HFA;VENTOLIN HFA) 108 (90 Base) MCG/ACT inhaler Inhale 2 puffs into the lungs every 6 (six) hours as needed for wheezing or shortness of breath. 10/11/16   Collene Gobble, MD  amitriptyline (ELAVIL) 150 MG tablet Take 150 mg by mouth at bedtime.      [provider]  aspirin EC 81 MG tablet Take 1 tablet (81 mg total) by mouth daily. 11/28/15   Fay Records, MD  dapagliflozin propanediol (FARXIGA) 10 MG TABS tablet Take 10 mg by mouth daily.    [provider]  Dulaglutide (TRULICITY) 1.5 BV/6.7OL SOPN Inject 1.5 mg into the skin every 7 (seven) days. 0.5 ml = 1.5 mg 08/23/18   [provider]  furosemide (LASIX) 80 MG tablet Take 0.5 tablets (40 mg total) by mouth daily. Patient taking differently: Take 80 mg by mouth daily.  07/20/18   Bhagat, Crista Luria, PA  gabapentin (NEURONTIN) 300 MG capsule Take by mouth daily. Take 300 am, 600 pm by mouth 07/07/18   [provider]  glipiZIDE (GLUCOTROL XL) 10 MG 24 hr tablet Take 10 mg by mouth daily with  breakfast.    [provider]  ipratropium (ATROVENT) 0.02 % nebulizer solution Take 2.5 mLs (0.5 mg total) by nebulization 4 (four) times daily. Patient taking differently: Take 0.5 mg by nebulization 4 (four) times daily as needed for wheezing or shortness of breath.  11/10/16   Parrett, Fonnie Mu, NP  LORazepam (ATIVAN) 1 MG tablet  Take 1 mg by mouth at bedtime.     [provider]  losartan (COZAAR) 50 MG tablet Take 1 tablet (50 mg total) by mouth daily. 07/03/18   Fay Records, MD  metFORMIN (GLUCOPHAGE-XR) 500 MG 24 hr tablet Take 1,000 mg by mouth 2 (two) times daily.     [provider]  metoprolol succinate (TOPROL-XL) 50 MG 24 hr tablet TAKE 1 TABLET (50 MG TOTAL) BY MOUTH 2 (TWO) TIMES DAILY. TAKE WITH OR IMMEDIATELY FOLLOWING A MEAL. 10/26/18 07/13/19  Bhagat, Crista Luria, PA  omeprazole (PRILOSEC) 40 MG capsule Take 40 mg by mouth daily.    [provider]  oxyCODONE-acetaminophen (PERCOCET) 10-325 MG tablet Take 1 tablet by mouth every 4 (four) hours as needed for pain. 10/14/18   [provider]  potassium chloride (KLOR-CON) 8 MEQ tablet Take 1 tablet (8 mEq total) by mouth daily as needed (take only when take lasix). 07/20/18   Bhagat, Crista Luria, PA  simvastatin (ZOCOR) 40 MG tablet Take 40 mg by mouth every evening.     [provider]  tiZANidine (ZANAFLEX) 4 MG tablet Take 4-12 mg by mouth at bedtime. 08/26/18   [provider]  traZODone (DESYREL) 50 MG tablet Take 50 mg by mouth at bedtime.     [provider]    Allergies    Hydromorphone hcl er, Tapentadol, and Exalamide  Review of Systems   Review of Systems  Constitutional: Negative for fever.  Eyes: Negative for visual disturbance.  Respiratory: Negative for shortness of breath.   Cardiovascular: Negative for chest pain.  Gastrointestinal: Negative for abdominal pain, nausea and vomiting.  Genitourinary: Negative for frequency.  Skin: Positive for  color change and wound.  Neurological: Negative for weakness and headaches.  All other systems reviewed and are negative.   Physical Exam Updated Vital Signs BP 113/73   Pulse (!) 105   Temp 98.5 F (36.9 C) (Oral)   Resp (!) 26   SpO2 99%   Physical Exam Vitals and nursing note reviewed.  Constitutional:      Appearance: Normal appearance. He is well-developed.  HENT:     Head: Normocephalic and atraumatic.  Eyes:     General: Lids are normal.     Conjunctiva/sclera: Conjunctivae normal.     Pupils: Pupils are equal, round, and reactive to light.  Cardiovascular:     Rate and Rhythm: Normal rate and regular rhythm.     Pulses: Normal pulses.          Dorsalis pedis pulses are 2+ on the right side and 2+ on the left side.     Heart sounds: Normal heart sounds. No murmur. No friction rub. No gallop.   Pulmonary:     Effort: Pulmonary effort is normal.     Breath sounds: Normal breath sounds.  Abdominal:     Palpations: Abdomen is soft. Abdomen is not rigid.     Tenderness: There is no abdominal tenderness. There is no guarding.  Musculoskeletal:        General: Normal range of motion.     Cervical back: Full passive range of motion without pain.  Skin:    General: Skin is warm and dry.     Capillary Refill: Capillary refill takes less than 2 seconds.     Comments: Right second digit is swollen and necrotic.  There is some mild purulent drainage noted.  He has redness that begins at the toe and extends up the dorsal aspect of  his foot to the distal tib-fib area.  There is some overlying warmth noted.  Neurological:     Mental Status: He is alert and oriented to person, place, and time.  Psychiatric:        Speech: Speech normal.           ED Results / Procedures / Treatments   Labs (all labs ordered are listed, but only abnormal results are displayed) Labs Reviewed  BASIC METABOLIC PANEL - Abnormal; Notable for the following components:      Result Value    Sodium 132 (*)    Potassium 3.2 (*)    Chloride 94 (*)    Glucose, Bld 294 (*)    Creatinine, Ser 0.60 (*)    Anion gap 16 (*)    All other components within normal limits  CBC - Abnormal; Notable for the following components:   WBC 15.5 (*)    All other components within normal limits  URINALYSIS, ROUTINE W REFLEX MICROSCOPIC - Abnormal; Notable for the following components:   Specific Gravity, Urine 1.041 (*)    Glucose, UA >=500 (*)    Ketones, ur 5 (*)    Protein, ur 30 (*)    All other components within normal limits  CBG MONITORING, ED - Abnormal; Notable for the following components:   Glucose-Capillary 293 (*)    All other components within normal limits  CBG MONITORING, ED - Abnormal; Notable for the following components:   Glucose-Capillary 237 (*)    All other components within normal limits  CBG MONITORING, ED - Abnormal; Notable for the following components:   Glucose-Capillary 188 (*)    All other components within normal limits    EKG None  Radiology DG Foot Complete Right  Result Date: 07/13/2019 CLINICAL DATA:  Foot pain. EXAM: RIGHT FOOT COMPLETE - 3+ VIEW COMPARISON:  None. FINDINGS: There is evidence of prior surgical amputation of the proximal phalanx and distal phalanx of the right great toe. There is no evidence of fracture or dislocation. A chronic deformity of the tuft of the distal phalanx of the second right toe is seen. There is no evidence of arthropathy or other focal bone abnormality. Soft tissues are unremarkable. IMPRESSION: 1. Chronic and postoperative changes involving the right great toe and distal phalanx of the second right toe, without evidence of an acute osseous abnormality. Electronically Signed   By: Virgina Norfolk M.D.   On: 07/13/2019 19:00    Procedures Procedures (including critical care time)  Medications Ordered in ED Medications  sodium chloride 0.9 % bolus 1,000 mL (0 mLs Intravenous Stopped 07/13/19 2150)  sodium chloride  0.9 % bolus 1,000 mL (0 mLs Intravenous Stopped 07/13/19 1949)  insulin aspart (novoLOG) injection 3 Units (3 Units Intravenous Given 07/13/19 1946)  potassium chloride SA (KLOR-CON) CR tablet 40 mEq (40 mEq Oral Given 07/13/19 1944)  vancomycin (VANCOREADY) IVPB 1500 mg/300 mL (0 mg Intravenous Stopped 07/13/19 2232)    ED Course  I have reviewed the triage vital signs and the nursing notes.  Pertinent labs & imaging results that were available during my care of the patient were reviewed by me and considered in my medical decision making (see chart for details).    MDM Rules/Calculators/A&P                      59 year old male possible history of diabetes, hyperlipidemia, hypertension who presents for evaluation of hyperglycemia.  Noted his blood sugars to be high.  He is scheduled for a toe amputation tomorrow.  He does feel like it is gotten slightly more red, more swollen. Patient is afebrile, non-toxic appearing, sitting comfortably on examination table. Vital signs reviewed and stable.  Good distal pulses.  Right second toe does appear necrotic and is has some slightly purulent drainage noted.  Labs ordered at triage.  Initial CBG is 293.  CBC shows leukocytosis of 15.5.  BUN is 10, creatinine is 0.60.  Bicarb is 22, anion gap is 16.  Potassium is 3.2.  UA shows 5 ketones.  I suspect this may be due to infectious etiology rather than full on DKA as his blood sugar is only 293.  We will plan to give him some fluids and some insulin to help correct his blood sugar.  I will touch base with orthopedics.  X-ray shows chronic and postoperative changes involving the right great toe and distal phalanx of the second right toe.  No evidence of acute osseous abnormality.  Discussed patient with Dr. Louanne Skye (Ortho) regarding patient's work-up here in the ED today.  At this time, he does not feel that patient requires admission.  He recommends giving 1 g of vancomycin here in the ED and then plans to  discharge him home with plan to follow-up with Ortho for surgery tomorrow as previously scheduled.  I updated patient on plan.  He is agreeable as he does not want to come into the hospital.  Blood sugar slightly improved after fluids and insulin.  He was at 188.  Patient does wish to go home.  He has surgery scheduled for tomorrow.  Instructed him to go to surgery as directed.  Patient expresses understanding. At this time, patient exhibits no emergent life-threatening condition that require further evaluation in ED or admission. Patient had ample opportunity for questions and discussion. All patient's questions were answered with full understanding. Strict return precautions discussed. Patient expresses understanding and agreement to plan.   Portions of this note were generated with [employer]. Dictation errors may occur despite best attempts at proofreading.   Final Clinical Impression(s) / ED Diagnoses Final diagnoses:  Hyperglycemia  Toe infection    Rx / DC Orders ED Discharge Orders    None       Desma Mcgregor 07/13/19 2355    Malvin Johns, MD 07/14/19 [phone]

## 2019-07-13 NOTE — Telephone Encounter (Signed)
Per our conversation, call patient about high blood sugar.  Per Steffanie Dunn, RN at [facility], result was [phone].

## 2019-07-16 ENCOUNTER — Other Ambulatory Visit: Payer: Self-pay | Admitting: Physician Assistant

## 2019-07-17 ENCOUNTER — Encounter (HOSPITAL_BASED_OUTPATIENT_CLINIC_OR_DEPARTMENT_OTHER)
Admission: RE | Disposition: A | Discharge status: Home / Self Care | Payer: Self-pay | Source: Home / Self Care | Attending: Orthopaedic Surgery

## 2019-07-17 ENCOUNTER — Ambulatory Visit (HOSPITAL_BASED_OUTPATIENT_CLINIC_OR_DEPARTMENT_OTHER): Payer: Medicare HMO | Admitting: Anesthesiology

## 2019-07-17 ENCOUNTER — Ambulatory Visit (HOSPITAL_BASED_OUTPATIENT_CLINIC_OR_DEPARTMENT_OTHER)
Admission: RE | Admit: 2019-07-17 | Discharge: 2019-07-17 | Disposition: A | Discharge status: Home / Self Care | Payer: Medicare HMO | Attending: Orthopaedic Surgery | Admitting: Orthopaedic Surgery

## 2019-07-17 ENCOUNTER — Encounter (HOSPITAL_BASED_OUTPATIENT_CLINIC_OR_DEPARTMENT_OTHER): Payer: Self-pay | Admitting: Orthopaedic Surgery

## 2019-07-17 ENCOUNTER — Other Ambulatory Visit: Payer: Self-pay

## 2019-07-17 DIAGNOSIS — I5032 Chronic diastolic (congestive) heart failure: Secondary | ICD-10-CM | POA: Insufficient documentation

## 2019-07-17 DIAGNOSIS — E1152 Type 2 diabetes mellitus with diabetic peripheral angiopathy with gangrene: Secondary | ICD-10-CM | POA: Insufficient documentation

## 2019-07-17 DIAGNOSIS — F329 Major depressive disorder, single episode, unspecified: Secondary | ICD-10-CM | POA: Insufficient documentation

## 2019-07-17 DIAGNOSIS — I96 Gangrene, not elsewhere classified: Secondary | ICD-10-CM | POA: Insufficient documentation

## 2019-07-17 DIAGNOSIS — Z9114 Patient's other noncompliance with medication regimen: Secondary | ICD-10-CM | POA: Diagnosis not present

## 2019-07-17 DIAGNOSIS — Z89512 Acquired absence of left leg below knee: Secondary | ICD-10-CM | POA: Diagnosis not present

## 2019-07-17 DIAGNOSIS — F419 Anxiety disorder, unspecified: Secondary | ICD-10-CM | POA: Insufficient documentation

## 2019-07-17 DIAGNOSIS — K760 Fatty (change of) liver, not elsewhere classified: Secondary | ICD-10-CM | POA: Insufficient documentation

## 2019-07-17 DIAGNOSIS — Z7984 Long term (current) use of oral hypoglycemic drugs: Secondary | ICD-10-CM | POA: Insufficient documentation

## 2019-07-17 DIAGNOSIS — E785 Hyperlipidemia, unspecified: Secondary | ICD-10-CM | POA: Insufficient documentation

## 2019-07-17 DIAGNOSIS — I110 Hypertensive heart disease with heart failure: Secondary | ICD-10-CM | POA: Insufficient documentation

## 2019-07-17 DIAGNOSIS — M869 Osteomyelitis, unspecified: Secondary | ICD-10-CM | POA: Diagnosis not present

## 2019-07-17 DIAGNOSIS — E11621 Type 2 diabetes mellitus with foot ulcer: Secondary | ICD-10-CM | POA: Insufficient documentation

## 2019-07-17 DIAGNOSIS — Z79899 Other long term (current) drug therapy: Secondary | ICD-10-CM | POA: Insufficient documentation

## 2019-07-17 DIAGNOSIS — Z833 Family history of diabetes mellitus: Secondary | ICD-10-CM | POA: Insufficient documentation

## 2019-07-17 DIAGNOSIS — Z87442 Personal history of urinary calculi: Secondary | ICD-10-CM | POA: Insufficient documentation

## 2019-07-17 DIAGNOSIS — Z8249 Family history of ischemic heart disease and other diseases of the circulatory system: Secondary | ICD-10-CM | POA: Insufficient documentation

## 2019-07-17 DIAGNOSIS — J449 Chronic obstructive pulmonary disease, unspecified: Secondary | ICD-10-CM | POA: Insufficient documentation

## 2019-07-17 DIAGNOSIS — Z7982 Long term (current) use of aspirin: Secondary | ICD-10-CM | POA: Insufficient documentation

## 2019-07-17 DIAGNOSIS — K219 Gastro-esophageal reflux disease without esophagitis: Secondary | ICD-10-CM | POA: Insufficient documentation

## 2019-07-17 DIAGNOSIS — F17210 Nicotine dependence, cigarettes, uncomplicated: Secondary | ICD-10-CM | POA: Insufficient documentation

## 2019-07-17 HISTORY — PX: AMPUTATION TOE: SHX6595

## 2019-07-17 LAB — GLUCOSE, CAPILLARY
Glucose-Capillary: 205 mg/dL — ABNORMAL HIGH (ref 70–99)
Glucose-Capillary: 153 mg/dL — ABNORMAL HIGH (ref 70–99)

## 2019-07-17 SURGERY — AMPUTATION, TOE
Anesthesia: Monitor Anesthesia Care | Site: Toe | Laterality: Right

## 2019-07-17 MED ORDER — MIDAZOLAM HCL 2 MG/2ML IJ SOLN
INTRAMUSCULAR | Status: AC
  Filled 2019-07-17: qty 2

## 2019-07-17 MED ORDER — LIDOCAINE HCL (CARDIAC) PF 100 MG/5ML IV SOSY
PREFILLED_SYRINGE | INTRAVENOUS | Status: DC | PRN
  Administered 2019-07-17: 40 mg via INTRAVENOUS

## 2019-07-17 MED ORDER — ONDANSETRON HCL 4 MG/2ML IJ SOLN: 4.0000 mg | Freq: Four times a day (QID) | INTRAMUSCULAR | Status: DC | PRN

## 2019-07-17 MED ORDER — FENTANYL CITRATE (PF) 100 MCG/2ML IJ SOLN: 25.0000 ug | INTRAMUSCULAR | Status: DC | PRN

## 2019-07-17 MED ORDER — CEFAZOLIN SODIUM-DEXTROSE 2-4 GM/100ML-% IV SOLN
INTRAVENOUS | Status: AC
  Filled 2019-07-17: qty 100

## 2019-07-17 MED ORDER — OXYCODONE HCL 5 MG/5ML PO SOLN: 5.0000 mg | Freq: Once | ORAL | Status: DC | PRN

## 2019-07-17 MED ORDER — PROPOFOL 500 MG/50ML IV EMUL
INTRAVENOUS | Status: DC | PRN
  Administered 2019-07-17 (×2): 40 mg via INTRAVENOUS
  Administered 2019-07-17: 75 ug/kg/min via INTRAVENOUS
  Administered 2019-07-17 (×2): 20 mg via INTRAVENOUS

## 2019-07-17 MED ORDER — PROPOFOL 10 MG/ML IV BOLUS
INTRAVENOUS | Status: AC
  Filled 2019-07-17: qty 20

## 2019-07-17 MED ORDER — OXYCODONE HCL 5 MG PO TABS: 5.0000 mg | ORAL_TABLET | Freq: Once | ORAL | Status: DC | PRN

## 2019-07-17 MED ORDER — LIDOCAINE 2% (20 MG/ML) 5 ML SYRINGE
INTRAMUSCULAR | Status: AC
  Filled 2019-07-17: qty 5

## 2019-07-17 MED ORDER — MIDAZOLAM HCL 2 MG/2ML IJ SOLN
1.0000 mg | INTRAMUSCULAR | Status: DC | PRN
  Administered 2019-07-17: 13:00:00 2 mg via INTRAVENOUS

## 2019-07-17 MED ORDER — CHLORHEXIDINE GLUCONATE 4 % EX LIQD: 60.0000 mL | Freq: Once | CUTANEOUS | Status: DC

## 2019-07-17 MED ORDER — LACTATED RINGERS IV SOLN: INTRAVENOUS | Status: DC

## 2019-07-17 MED ORDER — CEFAZOLIN SODIUM-DEXTROSE 2-4 GM/100ML-% IV SOLN
2.0000 g | INTRAVENOUS | Status: AC
  Administered 2019-07-17: 2 g via INTRAVENOUS

## 2019-07-17 MED ORDER — FENTANYL CITRATE (PF) 100 MCG/2ML IJ SOLN
INTRAMUSCULAR | Status: AC
  Filled 2019-07-17: qty 2

## 2019-07-17 MED ORDER — BUPIVACAINE HCL (PF) 0.5 % IJ SOLN
INTRAMUSCULAR | Status: DC | PRN
  Administered 2019-07-17: 30 mL via PERINEURAL

## 2019-07-17 MED ORDER — FENTANYL CITRATE (PF) 100 MCG/2ML IJ SOLN
50.0000 ug | INTRAMUSCULAR | Status: DC | PRN
  Administered 2019-07-17: 50 ug via INTRAVENOUS

## 2019-07-17 SURGICAL SUPPLY — 51 items
BLADE SURG 15 STRL LF DISP TIS (BLADE) ×2 IMPLANT
BLADE SURG 15 STRL SS (BLADE) ×6
BNDG CMPR 9X4 STRL LF SNTH (GAUZE/BANDAGES/DRESSINGS) ×1
BNDG COHESIVE 2X5 TAN STRL LF (GAUZE/BANDAGES/DRESSINGS) IMPLANT
BNDG COHESIVE 4X5 TAN STRL (GAUZE/BANDAGES/DRESSINGS) ×2 IMPLANT
BNDG CONFORM 2 STRL LF (GAUZE/BANDAGES/DRESSINGS) IMPLANT
BNDG CONFORM 3 STRL LF (GAUZE/BANDAGES/DRESSINGS) IMPLANT
BNDG ESMARK 4X9 LF (GAUZE/BANDAGES/DRESSINGS) ×2 IMPLANT
CORD BIPOLAR FORCEPS 12FT (ELECTRODE) ×1 IMPLANT
COVER BACK TABLE 60X90IN (DRAPES) ×3 IMPLANT
COVER MAYO STAND STRL (DRAPES) ×1 IMPLANT
COVER WAND RF STERILE (DRAPES) IMPLANT
DRAPE EXTREMITY T 121X128X90 (DISPOSABLE) ×3 IMPLANT
DRAPE IMP U-DRAPE 54X76 (DRAPES) ×2 IMPLANT
DRAPE SURG 17X23 STRL (DRAPES) ×3 IMPLANT
DRSG PAD ABDOMINAL 8X10 ST (GAUZE/BANDAGES/DRESSINGS) ×2 IMPLANT
DURAPREP 26ML APPLICATOR (WOUND CARE) ×1 IMPLANT
ELECT REM PT RETURN 9FT ADLT (ELECTROSURGICAL) ×3
ELECTRODE REM PT RTRN 9FT ADLT (ELECTROSURGICAL) IMPLANT
GAUZE 4X4 16PLY RFD (DISPOSABLE) IMPLANT
GAUZE XEROFORM 1X8 LF (GAUZE/BANDAGES/DRESSINGS) ×3 IMPLANT
GLOVE BIO SURGEON STRL SZ7.5 (GLOVE) ×3 IMPLANT
GLOVE BIO SURGEON STRL SZ8 (GLOVE) ×2 IMPLANT
GLOVE BIOGEL PI IND STRL 8 (GLOVE) ×1 IMPLANT
GLOVE BIOGEL PI INDICATOR 8 (GLOVE) ×4
GOWN STRL REUS W/ TWL LRG LVL3 (GOWN DISPOSABLE) ×2 IMPLANT
GOWN STRL REUS W/TWL LRG LVL3 (GOWN DISPOSABLE) ×6
GOWN STRL REUS W/TWL XL LVL3 (GOWN DISPOSABLE) ×3 IMPLANT
NDL HYPO 25X1 1.5 SAFETY (NEEDLE) IMPLANT
NEEDLE HYPO 25X1 1.5 SAFETY (NEEDLE) IMPLANT
NS IRRIG 1000ML POUR BTL (IV SOLUTION) ×3 IMPLANT
PACK BASIN DAY SURGERY FS (CUSTOM PROCEDURE TRAY) ×3 IMPLANT
PADDING CAST ABS 4INX4YD NS (CAST SUPPLIES) ×2
PADDING CAST ABS COTTON 4X4 ST (CAST SUPPLIES) ×1 IMPLANT
PENCIL SMOKE EVACUATOR (MISCELLANEOUS) ×2 IMPLANT
STOCKINETTE 6  STRL (DRAPES) ×2
STOCKINETTE 6 STRL (DRAPES) ×1 IMPLANT
SUCTION FRAZIER HANDLE 10FR (MISCELLANEOUS)
SUCTION TUBE FRAZIER 10FR DISP (MISCELLANEOUS) IMPLANT
SUT ETHILON 2 0 FS 18 (SUTURE) ×4 IMPLANT
SUT ETHILON 2 0 FSLX (SUTURE) ×2 IMPLANT
SUT ETHILON 4 0 PS 2 18 (SUTURE) IMPLANT
SUT ETHILON 5 0 P 3 18 (SUTURE)
SUT NYLON ETHILON 5-0 P-3 1X18 (SUTURE) IMPLANT
SYR BULB 3OZ (MISCELLANEOUS) ×3 IMPLANT
SYR CONTROL 10ML LL (SYRINGE) IMPLANT
TOWEL GREEN STERILE FF (TOWEL DISPOSABLE) ×4 IMPLANT
TRAY DSU PREP LF (CUSTOM PROCEDURE TRAY) ×2 IMPLANT
TUBE CONNECTING 20'X1/4 (TUBING)
TUBE CONNECTING 20X1/4 (TUBING) IMPLANT
UNDERPAD 30X36 HEAVY ABSORB (UNDERPADS AND DIAPERS) ×3 IMPLANT

## 2019-07-17 NOTE — Anesthesia Procedure Notes (Signed)
Anesthesia Regional Block: Popliteal block   Pre-Anesthetic Checklist: ,, timeout performed, Correct Patient, Correct Site, Correct Laterality, Correct Procedure, Correct Position, site marked, Risks and benefits discussed,  Surgical consent,  Pre-op evaluation,  At surgeon's request and post-op pain management  Laterality: Right  Prep: chloraprep       Needles:  Injection technique: Single-shot  Needle Type: Echogenic Stimulator Needle          Additional Needles:   Procedures:, nerve stimulator,,,,,,,   Nerve Stimulator or Paresthesia:  Response: plantar flexion of foot, 0.45 mA,   Additional Responses:   Narrative:  Start time: 07/17/2019 12:41 PM End time: 07/17/2019 12:46 PM Injection made incrementally with aspirations every 5 mL.  Performed by: Personally  Anesthesiologist: Albertha Ghee, MD  Additional Notes: Functioning IV was confirmed and monitors were applied.  A 43mm 21ga Arrow echogenic stimulator needle was used. Sterile prep and drape,hand hygiene and sterile gloves were used.  Negative aspiration and negative test dose prior to incremental administration of local anesthetic. The patient tolerated the procedure well.  Ultrasound guidance: relevent anatomy identified, needle position confirmed, local anesthetic spread visualized around nerve(s), vascular puncture avoided.  Image printed for medical record.

## 2019-07-17 NOTE — Anesthesia Preprocedure Evaluation (Signed)
Anesthesia Evaluation  Patient identified by MRN, date of birth, ID band Patient awake    Reviewed: Allergy & Precautions, H&P , NPO status , Patient's Chart, lab work & pertinent test results  Airway Mallampati: II   Neck ROM: full    Dental   Pulmonary COPD, Current Smoker and Patient abstained from smoking.,    breath sounds clear to auscultation       Cardiovascular hypertension, +CHF   Rhythm:regular Rate:Normal  a. Echo 6/17: severe conc LVH, vigorous EF, EF 65-70%, no dynamic obstruction, no RWMA, Gr 1 DD, mild TR  //  b. LHC 8/17: no sig CAD, LVEDP 28   Neuro/Psych PSYCHIATRIC DISORDERS Anxiety Depression    GI/Hepatic GERD  ,  Endo/Other  diabetes, Type 2  Renal/GU      Musculoskeletal   Abdominal   Peds  Hematology   Anesthesia Other Findings   Reproductive/Obstetrics                             Anesthesia Physical Anesthesia Plan  ASA: II  Anesthesia Plan: Regional and MAC   Post-op Pain Management:    Induction: Intravenous  PONV Risk Score and Plan: 0 and Treatment may vary due to age or medical condition  Airway Management Planned: Simple Face Mask  Additional Equipment:   Intra-op Plan:   Post-operative Plan:   Informed Consent: I have reviewed the patients History and Physical, chart, labs and discussed the procedure including the risks, benefits and alternatives for the proposed anesthesia with the patient or authorized representative who has indicated his/her understanding and acceptance.       Plan Discussed with: CRNA, Anesthesiologist and Surgeon  Anesthesia Plan Comments:         Anesthesia Quick Evaluation

## 2019-07-17 NOTE — Discharge Instructions (Signed)
Please keep your blood glucose under control as best as possible. You may put weight as tolerated on your right foot. Ice and elevation for swelling. Please keep your right foot dressing clean and dry. You may remove your dressing in 1 week. In 1 week, do place a small amount of triple antibiotic ointment on your incision daily and then new dry dressing each day.   Post Anesthesia Home Care Instructions  Activity: Get plenty of rest for the remainder of the day. A responsible individual must stay with you for 24 hours following the procedure.  For the next 24 hours, DO NOT: -Drive a car -Paediatric nurse -Drink alcoholic beverages -Take any medication unless instructed by your physician -Make any legal decisions or sign important papers.  Meals: Start with liquid foods such as gelatin or soup. Progress to regular foods as tolerated. Avoid greasy, spicy, heavy foods. If nausea and/or vomiting occur, drink only clear liquids until the nausea and/or vomiting subsides. Call your physician if vomiting continues.  Special Instructions/Symptoms: Your throat may feel dry or sore from the anesthesia or the breathing tube placed in your throat during surgery. If this causes discomfort, gargle with warm salt water. The discomfort should disappear within 24 hours.  If you had a scopolamine patch placed behind your ear for the management of post- operative nausea and/or vomiting:  1. The medication in the patch is effective for 72 hours, after which it should be removed.  Wrap patch in a tissue and discard in the trash. Wash hands thoroughly with soap and water. 2. You may remove the patch earlier than 72 hours if you experience unpleasant side effects which may include dry mouth, dizziness or visual disturbances. 3. Avoid touching the patch. Wash your hands with soap and water after contact with the patch.  Regional Anesthesia Blocks  1. Numbness or the inability to move the "blocked" extremity  may last from 3-48 hours after placement. The length of time depends on the medication injected and your individual response to the medication. If the numbness is not going away after 48 hours, call your surgeon.  2. The extremity that is blocked will need to be protected until the numbness is gone and the  Strength has returned. Because you cannot feel it, you will need to take extra care to avoid injury. Because it may be weak, you may have difficulty moving it or using it. You may not know what position it is in without looking at it while the block is in effect.  3. For blocks in the legs and feet, returning to weight bearing and walking needs to be done carefully. You will need to wait until the numbness is entirely gone and the strength has returned. You should be able to move your leg and foot normally before you try and bear weight or walk. You will need someone to be with you when you first try to ensure you do not fall and possibly risk injury.  4. Bruising and tenderness at the needle site are common side effects and will resolve in a few days.  5. Persistent numbness or new problems with movement should be communicated to the surgeon or the [place] [phone] [place] [phone]).

## 2019-07-17 NOTE — H&P (Signed)
Troy [name] is an 59 y.o. male.   Chief Complaint: Right foot second toe with drainage, necrosis and obvious infection HPI: The patient is a 59 year old poorly controlled diabetic who has a history of a crushing injury to his right foot years ago in which he had to undergo a first ray amputation.  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  He has not been compliant with his blood glucose medications.  He is now developed a wound on his right foot second toe.  There is redness and drainage and findings consistent with osteomyelitis.  We are recommending a second toe amputation.  He understands fully that we may end up having to remove tissue higher up if this does not heal in light of his poorly controlled diabetes.  Past Medical History:  Diagnosis Date  . Acute renal failure ([place])    in setting of NSAID use and orthopedic surgery 2010  . Anxiety and depression   . Chronic diastolic CHF (congestive heart failure) ([place])    a. Echo 6/17: severe conc LVH, vigorous EF, EF 65-70%, no dynamic obstruction, no RWMA, Gr 1 DD, mild TR  //  b. LHC 8/17: no sig CAD, LVEDP 28  . COPD (chronic obstructive pulmonary disease) ([place])   . Diabetic ulcer of left foot ([place])   . DM2 (diabetes mellitus, type 2) ([place])   . Family history of early CAD   . Fatty liver   . GERD (gastroesophageal reflux disease)   . History of amputation of foot ([place])    L trans-met // R toe  . History of cardiac catheterization    a. [place] 2002: irregs  //  b. LHC in 8/17: no sig CAD, apical DK, hyperdynamic LV, LVEDP 28  . History of kidney stones   . History of nuclear stress test    a. Nuc 7/17: Overall, intermediate risk nuclear stress test secondary to small size of apical lateral defect and reduced ejection fraction.  EF 43%  . HLD (hyperlipidemia)   . HTN (hypertension)   . Injuries     crushing injury to both his feet in February 2010.   Marland Kitchen Kidney calculi   . Palpitations   . Tobacco abuse     Past Surgical  History:  Procedure Laterality Date  . AMPUTATION Left 01/03/2018   Procedure: LEFT MIDFOOT AMPUTATION/REVISION MIDAMPUTAION;  Surgeon: Mcarthur Rossetti, MD;  Location: [facility];  Service: Orthopedics;  Laterality: Left;  . AMPUTATION Left 01/25/2018   Procedure: LEFT BELOW KNEE AMPUTATION;  Surgeon: [name], MD;  Location: [facility];  Service: Orthopedics;  Laterality: Left;  . BELOW KNEE LEG AMPUTATION Left 01/25/2018  . CARDIAC CATHETERIZATION N/A 01/22/2016   Procedure: Left Heart Cath and Coronary Angiography;  Surgeon: Peter M Martinique, MD;  Location: [facility];  Service: Cardiovascular;  Laterality: N/A;  . FOOT AMPUTATION Bilateral   . I & D EXTREMITY Left 12/15/2017   Procedure: IRRIGATION AND DEBRIDEMENT LEFT FOOT ULCER;  Surgeon: Mcarthur Rossetti, MD;  Location: [facility];  Service: Orthopedics;  Laterality: Left;  . LITHOTRIPSY    . TENDON LENGTHENING Bilateral    calf  . TONSILLECTOMY      Family History  Problem Relation Age of Onset  . Leukemia Mother 64       died  . Lung cancer Father 10       died  . Heart attack Brother 29  . Heart attack Brother 70  . Hypertension  Brother        X3  . Hypertension Sister        X77  . Diabetes Sister   . Stroke Sister   . Diabetes Sister   . Other Brother        Musician accident   Social History:  reports that he has been smoking cigarettes. He has a 20.00 pack-year smoking history. He has never used smokeless tobacco. He reports that he does not drink alcohol or use drugs.  Allergies:  Allergies  Allergen Reactions  . Hydromorphone Hcl Er Anaphylaxis and Other (See Comments)    Allergic to DYE in extended-release tablet, can tolerate other forms of hydromorphone  . Tapentadol Anaphylaxis, Swelling and Other (See Comments)    THROAT ANGIOEDEMA Nucynta [Tapentadol Hydrochloride]  . Exalamide Other (See Comments)    UNSPECIFIED REACTION     Medications Prior to Admission  Medication Sig Dispense Refill  .  amitriptyline (ELAVIL) 150 MG tablet Take 150 mg by mouth at bedtime.      Marland Kitchen aspirin EC 81 MG tablet Take 1 tablet (81 mg total) by mouth daily. 90 tablet 3  . dapagliflozin propanediol (FARXIGA) 10 MG TABS tablet Take 10 mg by mouth daily.    . Dulaglutide (TRULICITY) 1.5 MM/7.6KG SOPN Inject 1.5 mg into the skin every 7 (seven) days. 0.5 ml = 1.5 mg    . furosemide (LASIX) 80 MG tablet Take 0.5 tablets (40 mg total) by mouth daily. (Patient taking differently: Take 80 mg by mouth daily. ) 15 tablet 3  . gabapentin (NEURONTIN) 300 MG capsule Take by mouth daily. Take 300 am, 600 pm by mouth    . glipiZIDE (GLUCOTROL XL) 10 MG 24 hr tablet Take 10 mg by mouth daily with breakfast.    . LORazepam (ATIVAN) 1 MG tablet Take 1 mg by mouth at bedtime.     Marland Kitchen losartan (COZAAR) 50 MG tablet Take 1 tablet (50 mg total) by mouth daily. 30 tablet 0  . metFORMIN (GLUCOPHAGE-XR) 500 MG 24 hr tablet Take 1,000 mg by mouth 2 (two) times daily.     . metoprolol succinate (TOPROL-XL) 50 MG 24 hr tablet TAKE 1 TABLET (50 MG TOTAL) BY MOUTH 2 (TWO) TIMES DAILY. TAKE WITH OR IMMEDIATELY FOLLOWING A MEAL. 180 tablet 1  . omeprazole (PRILOSEC) 40 MG capsule Take 40 mg by mouth daily.    Marland Kitchen oxyCODONE-acetaminophen (PERCOCET) 10-325 MG tablet Take 1 tablet by mouth every 4 (four) hours as needed for pain.    . potassium chloride (KLOR-CON) 8 MEQ tablet Take 1 tablet (8 mEq total) by mouth daily as needed (take only when take lasix). 15 tablet 3  . simvastatin (ZOCOR) 40 MG tablet Take 40 mg by mouth every evening.     Marland Kitchen tiZANidine (ZANAFLEX) 4 MG tablet Take 4-12 mg by mouth at bedtime.    . traZODone (DESYREL) 50 MG tablet Take 50 mg by mouth at bedtime.     Marland Kitchen albuterol (PROVENTIL HFA;VENTOLIN HFA) 108 (90 Base) MCG/ACT inhaler Inhale 2 puffs into the lungs every 6 (six) hours as needed for wheezing or shortness of breath. 1 Inhaler 6  . ipratropium (ATROVENT) 0.02 % nebulizer solution Take 2.5 mLs (0.5 mg total) by  nebulization 4 (four) times daily. (Patient taking differently: Take 0.5 mg by nebulization 4 (four) times daily as needed for wheezing or shortness of breath. ) 75 mL 12    Results for orders placed or performed during the hospital encounter of  07/17/19 (from the past 48 hour(s))  Glucose, capillary     Status: Abnormal   Collection Time: 07/17/19 11:39 AM  Result Value Ref Range   Glucose-Capillary 205 (H) 70 - 99 mg/dL   No results found.  Review of Systems  Height 5' 10"  (1.778 m), weight 86.6 kg. Physical Exam  Constitutional: He is oriented to person, place, and time. He appears well-developed and well-nourished.  HENT:  Head: Normocephalic and atraumatic.  Eyes: Pupils are equal, round, and reactive to light.  Cardiovascular: Normal rate.  Respiratory: Effort normal.  GI: Soft.  Musculoskeletal:     Cervical back: Normal range of motion.       Feet:  Neurological: He is alert and oriented to person, place, and time.  Skin: Skin is warm and dry.  Psychiatric: He has a normal mood and affect.     Assessment/Plan Right foot with second toe deep infection  Our plan is to proceed to surgery as an outpatient today for a second toe amputation through the MTP joint.  The patient understands fully the risk and benefits of surgery.  He understands that his healing potential is diminished given his poor diabetic control.  He understands that he may end up having to have more surgery if this does not heal and he needs to be compliant with his diabetic regimen.  The risk and benefits of surgery were discussed in detail.  The right foot is marked and informed consent is obtained.  Mcarthur Rossetti, MD 07/17/2019, 11:46 AM

## 2019-07-17 NOTE — Op Note (Signed)
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  PROCEDURE:  Right second toe amputation through metatarsophalangeal joint.  SURGEON:  Lind Guest. Ninfa Linden, MD  ASSISTANT:  Erskine Emery, PA-C.  ANESTHESIA: 1.  Right lower extremity popliteal block. 2.  Mask ventilation, IV sedation.  ANTIBIOTICS:  Two g IV Ancef.  ESTIMATED BLOOD LOSS:  Minimal.  COMPLICATIONS:  None.  INDICATIONS:  The patient is a 59 year old poorly controlled diabetic who presented to the office with an ulcer of his right foot second toe.  He has had a previous first ray resection remotely.  His blood glucose has been running way out of control.   His toe is necrotic and has an active infection.  At this point, we recommended a second toe amputation.  He understands fully that if his blood glucose is not any better control, he will have difficulty healing this wound and he may end up with a  transmetatarsal amputation.  We had a long and thorough discussion about the risks and benefits of surgery and he does wish to proceed.  DESCRIPTION OF PROCEDURE:  After informed consent was obtained, an appropriate right foot was marked and a popliteal block was obtained in the holding room of his right lower extremity.  He was then brought to the operating room and placed supine on the  operating table.  Mask ventilation, IV sedation was obtained.  His right foot was prescrubbed with ChloraPrep and then Betadine paint was utilized.  A timeout was called to identify correct patient, correct right second toe.  I then used an Esmarch  around the ankle as a local  tourniquet.  A time-out was called to again identify the correct patient, correct  right second toe.  We then ellipsed out the right second toe to the MTP joint and removed in its entirety.  There was gross infection of the  toe.  We were able to debride the bone back to a stable margin.  We did sharply debride necrotic skin and bone.  Once we got back to the MTP joint, the metatarsal head actually looked good.  We then irrigated the soft tissue with normal saline solution.   We let our Esmarch tourniquet down and hemostasis was obtained with electrocautery.  We then loosely reapproximated the skin with interrupted 2-0 nylon suture.  Xeroform well-padded sterile dressing was applied.  He was taken to recovery room in stable  condition.  All final counts were correct.  There were no complications noted.  VN/NUANCE  D:07/17/2019 T:07/17/2019 JOB:[id number]

## 2019-07-17 NOTE — Progress Notes (Signed)
Assisted Dr. Hodierne with right, ultrasound guided, popliteal block. Side rails up, monitors on throughout procedure. See vital signs in flow sheet. Tolerated Procedure well. 

## 2019-07-17 NOTE — Transfer of Care (Signed)
Immediate Anesthesia Transfer of Care Note  Patient: Troy [name]  Procedure(s) Performed: AMPUTATION RIGHT FOOT 2ND TOE (Right Toe)  Patient Location: PACU  Anesthesia Type:MAC combined with regional for post-op pain  Level of Consciousness: awake, alert , oriented and patient cooperative  Airway & Oxygen Therapy: Patient Spontanous Breathing and Patient connected to face mask oxygen  Post-op Assessment: Report given to RN and Post -op Vital signs reviewed and stable  Post vital signs: Reviewed and stable  Last Vitals:  Vitals Value Taken Time  BP 102/63 07/17/19 1445  Temp    Pulse 84 07/17/19 1447  Resp 17 07/17/19 1447  SpO2 100 % 07/17/19 1447  Vitals shown include unvalidated device data.  Last Pain:  Vitals:   07/17/19 1203  TempSrc: Oral  PainSc: 8       Patients Stated Pain Goal: 5 (44/58/48 3507)  Complications: No apparent anesthesia complications

## 2019-07-18 ENCOUNTER — Encounter: Payer: Self-pay | Admitting: *Deleted

## 2019-07-18 NOTE — Anesthesia Postprocedure Evaluation (Signed)
Anesthesia Post Note  Patient: Troy [name]  Procedure(s) Performed: AMPUTATION RIGHT FOOT 2ND TOE (Right Toe)     Patient location during evaluation: PACU Anesthesia Type: Regional and MAC Level of consciousness: awake and alert Pain management: pain level controlled Vital Signs Assessment: post-procedure vital signs reviewed and stable Respiratory status: spontaneous breathing, nonlabored ventilation, respiratory function stable and patient connected to nasal cannula oxygen Cardiovascular status: stable and blood pressure returned to baseline Postop Assessment: no apparent nausea or vomiting Anesthetic complications: no    Last Vitals:  Vitals:   07/17/19 1500 07/17/19 1515  BP: 110/69 108/60  Pulse: 79 83  Resp: 10 16  Temp:  36.9 C  SpO2: 100% 100%    Last Pain:  Vitals:   07/17/19 1500  TempSrc:   PainSc: 0-No pain                 Jhanae Jaskowiak S

## 2019-08-01 ENCOUNTER — Other Ambulatory Visit: Payer: Self-pay

## 2019-08-01 ENCOUNTER — Encounter: Payer: Self-pay | Admitting: Orthopaedic Surgery

## 2019-08-01 ENCOUNTER — Ambulatory Visit (INDEPENDENT_AMBULATORY_CARE_PROVIDER_SITE_OTHER): Payer: Medicare HMO | Admitting: Orthopaedic Surgery

## 2019-08-01 DIAGNOSIS — Z89421 Acquired absence of other right toe(s): Secondary | ICD-10-CM

## 2019-08-01 DIAGNOSIS — M869 Osteomyelitis, unspecified: Secondary | ICD-10-CM

## 2019-08-01 NOTE — Progress Notes (Signed)
The patient comes in today 2 weeks after having his second toe amputated.  He is a poorly controlled diabetic.  He reports he is doing well.  On examination the wound is asked looks pretty good.  I did remove the sutures.  There is some areas that has some granulation tissue exposed but no exposed bone and overall things look good.  I want him to soak his foot daily and antibacterial soapy water for about 15 to 20 minutes.  He then should dry at all for a well and place Bactroban ointment over the wound.  He will then place a dry dressing over this.  I would like to see him back in 2 weeks for wound check.  All question concerns were answered and addressed.

## 2019-08-15 ENCOUNTER — Other Ambulatory Visit: Payer: Self-pay

## 2019-08-15 ENCOUNTER — Encounter: Payer: Self-pay | Admitting: Orthopaedic Surgery

## 2019-08-15 ENCOUNTER — Ambulatory Visit (INDEPENDENT_AMBULATORY_CARE_PROVIDER_SITE_OTHER): Payer: Medicare HMO | Admitting: Orthopaedic Surgery

## 2019-08-15 DIAGNOSIS — Z89421 Acquired absence of other right toe(s): Secondary | ICD-10-CM

## 2019-08-15 NOTE — Progress Notes (Signed)
The patient is following up after having an amputation of the second toe on the right foot.  He is a poorly controlled diabetic.  He states that he is doing well overall.  He reports better blood glucose control.  On examination there is still a small wound at the amputation site with there is no evidence of infection at all.  There is no significant drainage.  There is good granulation tissue.  We will continue to treat this with daily soaks and Bactroban ointment.  This should heal over based on how good his foot looks overall.  I did reiterate the importance of good diabetic control.  We will see him back for final visit in 4 weeks.  He understands if there is any issues before then he will let us know.

## 2019-09-12 ENCOUNTER — Ambulatory Visit: Payer: Medicare HMO | Admitting: Orthopaedic Surgery

## 2019-10-01 ENCOUNTER — Ambulatory Visit: Payer: Medicare HMO | Admitting: Orthopaedic Surgery

## 2019-11-26 ENCOUNTER — Other Ambulatory Visit: Payer: Self-pay

## 2019-11-26 ENCOUNTER — Encounter: Payer: Self-pay | Admitting: Orthopedic Surgery

## 2019-11-26 ENCOUNTER — Ambulatory Visit: Payer: Medicare HMO | Admitting: Physician Assistant

## 2019-11-26 VITALS — Ht 70.0 in | Wt 187.2 lb

## 2019-11-26 DIAGNOSIS — Z89512 Acquired absence of left leg below knee: Secondary | ICD-10-CM

## 2019-11-26 DIAGNOSIS — S88112A Complete traumatic amputation at level between knee and ankle, left lower leg, initial encounter: Secondary | ICD-10-CM

## 2019-11-26 NOTE — Progress Notes (Signed)
Office Visit Note   Patient: Troy [name]           Date of Birth: 02-07-1961           MRN: [id number] Visit Date: 11/26/2019              Requested by: [name], MD [address] [place],  [place] [address] PCP: [name], MD  Chief Complaint  Patient presents with  . 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  He has been wearing her prosthetic and overall has been doing well.  He has lost about 70 pounds and his prosthetic is now ill fitting.  It is caused an area of pressure at the end of the prosthetic.  They have tried padding it but this does not improve it.  He also feels unsteady and is wearing the maximum amount of socks he has.  Assessment & Plan: Visit Diagnoses: No diagnosis found.  Plan: Prescription was provided for [employer] to fashion a new socket and supplies. Patient is an existing left transtibial  amputee.  Patient's current comorbidities are not expected to impact the ability to function with the prescribed prosthesis. Patient verbally communicates a strong desire to use a prosthesis. Patient currently requires mobility aids to ambulate without a prosthesis.  Expects not to use mobility aids with a new prosthesis.  Patient is a K3 level ambulator that spends a lot of time walking around on uneven terrain over obstacles, up and down stairs, and ambulates with a variable cadence.    Follow-Up Instructions: No follow-ups on file.   Ortho Exam  Patient is alert, oriented, no adenopathy, well-dressed, normal affect, normal respiratory effort. Focused examination status post left below-knee amputation.  He does have a small superficial area of skin breakdown.  There is no foul odor this does not probe deeply.  It is approximately 2 cm in diameter and a half a centimeter deep.  There is no purulent drainage  Imaging: No results found. No  images are attached to the encounter.  Labs: Lab Results  Component Value Date   HGBA1C 11.0 (H) 12/13/2017     Lab Results  Component Value Date   ALBUMIN 4.0 10/26/2018   ALBUMIN 4.2 10/22/2018   ALBUMIN 3.8 05/09/2011    Lab Results  Component Value Date   MG 1.4 (L) 10/22/2018   MG 1.9 08/14/2008   MG 2.2 08/11/2008   No results found for: VD25OH  No results found for: PREALBUMIN CBC EXTENDED Latest Ref Rng & Units 07/13/2019 10/26/2018 10/25/2018  WBC 4.0 - 10.5 K/uL 15.5(H) 10.6(H) 12.7(H)  RBC 4.22 - 5.81 MIL/uL 5.58 5.84(H) 6.11(H)  HGB 13.0 - 17.0 g/dL 16.1 16.3 17.2(H)  HCT 39.0 - 52.0 % 47.1 50.2 52.7(H)  PLT 150 - 400 K/uL 213 220 255  NEUTROABS 1.7 - 7.7 K/uL - 7.0 -  LYMPHSABS 0.7 - 4.0 K/uL - 2.7 -     Body mass index is 26.86 kg/m.  Orders:  No orders of the defined types were placed in this encounter.  No orders of the defined types were placed in this encounter.    Procedures: No procedures performed  Clinical Data: No additional findings.  ROS:  All other systems negative, except as noted in the HPI. Review of Systems  Objective: Vital Signs: Ht 5' 10"  (1.778 m)  Wt 187 lb 2.7 oz (84.9 kg)   BMI 26.86 kg/m   Specialty Comments:  No specialty comments available.  PMFS History: Patient Active Problem List   Diagnosis Date Noted  . Osteomyelitis of second toe of right foot ([place]) 07/12/2019  . Below knee amputation status, left 01/25/2018  . Wound dehiscence, surgical   . Foot amputation status, left 01/03/2018  . Status post left foot surgery 12/15/2017  . Snoring 06/07/2016  . Chest pain 01/22/2016  . Abnormal nuclear stress test 01/22/2016  . COPD (chronic obstructive pulmonary disease) ([place]) 01/27/2011  . Pre-operative cardiovascular examination 01/27/2011  . Nonspecific abnormal electrocardiogram (ECG) (EKG) 01/27/2011  . Murmur 01/27/2011  . DM2 (diabetes mellitus, type 2) ([place])   . HTN (hypertension)   . HLD  (hyperlipidemia)   . Tobacco abuse    Past Medical History:  Diagnosis Date  . Acute renal failure ([place])    in setting of NSAID use and orthopedic surgery 2010  . Anxiety and depression   . Chronic diastolic CHF (congestive heart failure) ([place])    a. Echo 6/17: severe conc LVH, vigorous EF, EF 65-70%, no dynamic obstruction, no RWMA, Gr 1 DD, mild TR  //  b. LHC 8/17: no sig CAD, LVEDP 28  . COPD (chronic obstructive pulmonary disease) ([place])   . Diabetic ulcer of left foot ([place])   . DM2 (diabetes mellitus, type 2) ([place])   . Family history of early CAD   . Fatty liver   . GERD (gastroesophageal reflux disease)   . History of amputation of foot ([place])    L trans-met // R toe  . History of cardiac catheterization    a. [place] 2002: irregs  //  b. LHC in 8/17: no sig CAD, apical DK, hyperdynamic LV, LVEDP 28  . History of kidney stones   . History of nuclear stress test    a. Nuc 7/17: Overall, intermediate risk nuclear stress test secondary to small size of apical lateral defect and reduced ejection fraction.  EF 43%  . HLD (hyperlipidemia)   . HTN (hypertension)   . Injuries     crushing injury to both his feet in February 2010.   Marland Kitchen Kidney calculi   . Palpitations   . Tobacco abuse     Family History  Problem Relation Age of Onset  . Leukemia Mother 100       died  . Lung cancer Father 47       died  . Heart attack Brother 50  . Heart attack Brother 82  . Hypertension Brother        X3  . Hypertension Sister        X13  . Diabetes Sister   . Stroke Sister   . Diabetes Sister   . Other Brother        Musician accident    Past Surgical History:  Procedure Laterality Date  . AMPUTATION Left 01/03/2018   Procedure: LEFT MIDFOOT AMPUTATION/REVISION MIDAMPUTAION;  Surgeon: Mcarthur Rossetti, MD;  Location: [facility];  Service: Orthopedics;  Laterality: Left;  . AMPUTATION Left 01/25/2018   Procedure: LEFT BELOW KNEE AMPUTATION;  Surgeon: [name], MD;  Location: [facility];   Service: Orthopedics;  Laterality: Left;  . AMPUTATION TOE Right 07/17/2019   Procedure: AMPUTATION RIGHT FOOT 2ND TOE;  Surgeon: Mcarthur Rossetti, MD;  Location: [facility];  Service: Orthopedics;  Laterality: Right;  . BELOW KNEE LEG AMPUTATION Left 01/25/2018  . CARDIAC  CATHETERIZATION N/A 01/22/2016   Procedure: Left Heart Cath and Coronary Angiography;  Surgeon: Peter M Martinique, MD;  Location: [facility];  Service: Cardiovascular;  Laterality: N/A;  . FOOT AMPUTATION Bilateral   . I & D EXTREMITY Left 12/15/2017   Procedure: IRRIGATION AND DEBRIDEMENT LEFT FOOT ULCER;  Surgeon: Mcarthur Rossetti, MD;  [facility];  Service: Orthopedics;  Laterality: Left;  . LITHOTRIPSY    . TENDON LENGTHENING Bilateral    calf  . TONSILLECTOMY     Social History   Occupational History  . Occupation: DISABLED  Tobacco Use  . Smoking status: Current Every Day Smoker    Packs/day: 0.50    Years: 40.00    Pack years: 20.00    Types: Cigarettes    Last attempt to quit: 01/23/2018    Years since quitting: 1.8  . Smokeless tobacco: Never Used  Substance and Sexual Activity  . Alcohol use: No  . Drug use: No  . Sexual activity: Not on file

## 2020-05-07 ENCOUNTER — Ambulatory Visit (INDEPENDENT_AMBULATORY_CARE_PROVIDER_SITE_OTHER): Payer: Medicare HMO

## 2020-05-07 ENCOUNTER — Encounter: Payer: Self-pay | Admitting: Orthopaedic Surgery

## 2020-05-07 ENCOUNTER — Other Ambulatory Visit: Payer: Self-pay

## 2020-05-07 ENCOUNTER — Ambulatory Visit (INDEPENDENT_AMBULATORY_CARE_PROVIDER_SITE_OTHER): Payer: Medicare HMO | Admitting: Orthopaedic Surgery

## 2020-05-07 DIAGNOSIS — Z89421 Acquired absence of other right toe(s): Secondary | ICD-10-CM

## 2020-05-07 DIAGNOSIS — L97512 Non-pressure chronic ulcer of other part of right foot with fat layer exposed: Secondary | ICD-10-CM

## 2020-05-07 DIAGNOSIS — Z89411 Acquired absence of right great toe: Secondary | ICD-10-CM | POA: Diagnosis not present

## 2020-05-07 DIAGNOSIS — S98111A Complete traumatic amputation of right great toe, initial encounter: Secondary | ICD-10-CM

## 2020-05-07 MED ORDER — SILVER SULFADIAZINE 1 % EX CREA: TOPICAL_CREAM | Freq: Every day | CUTANEOUS | Status: DC

## 2020-05-07 MED ORDER — DOXYCYCLINE HYCLATE 100 MG PO TABS: 100.0000 mg | ORAL_TABLET | Freq: Two times a day (BID) | ORAL | 0 refills | Status: AC

## 2020-05-07 NOTE — Progress Notes (Signed)
foo

## 2020-05-07 NOTE — Progress Notes (Signed)
Office Visit Note   Patient: Troy [name]           Date of Birth: [birth year]           MRN: [id number] Visit Date: 05/07/2020              Requested by: [name], MD [address] [place],  [place] [address] PCP: [name], MD   Assessment & Plan: Visit Diagnoses:  1. Amputation of right great toe ([place])   2. Foot ulcer with fat layer exposed, right ([place])     Plan: Right foot ulceration plantar aspect of th foot under the first metatarsal head area is debrided. Patient tolerates well. We will have him wash the foot daily with Dial soap and then dry completely. Then apply a small amount of Silvadene to the foot and apply dry dressing. Also placed him on doxycycline. Questions were encouraged and answered by Dr. Ninfa Linden myself. Did discuss with him at length that he needs to get better control of his diabetes. He will follow up with Dr. Sharol Given in 2 weeks sooner if he has any redness or purulence from the foot.  Follow-Up Instructions: Return in about 2 weeks (around 05/21/2020), or Dr. Sharol Given.   Orders:  Orders Placed This Encounter  Procedures  . XR Foot Complete Right   No orders of the defined types were placed in this encounter.     Procedures: No procedures performed   Clinical Data: No additional findings.   Subjective: Chief Complaint  Patient presents with  . Right Foot - Open Wound    HPI  Troy [name] is a 59 year old male well-known to our department service comes in today with right foot wound under the first metatarsal head area right foot. He states is been ongoing for 6 months his wife states is been ongoing for a few months. Poorly controlled diabetic: His wife reports that her sugars have been up as high as 400 due to the fact that he will not take his medications. He did see the orthotist the other day and they adjusted his orthotic to relieve pressure off the right first metatarsal head area. He denies any fevers chills shortness of breath  chest pain.   Review of Systems Please see HPI  Objective: Vital Signs: There were no vitals taken for this visit.  Physical Exam Constitutional:      Appearance: He is not ill-appearing or diaphoretic.  Pulmonary:     Effort: Pulmonary effort is normal.  Neurological:     Mental Status: He is alert and oriented to person, place, and time.  Psychiatric:        Mood and Affect: Mood normal.     Ortho Exam Surgical incisions from right first and second toe amputations are well-healed. He has abundant callus with a grade 3 stage ulcer under the first metatarsal head area. There is slight malodor. No purulence. Palpable dorsal pedal pulse. Specialty Comments:  No specialty comments available.  Imaging: XR Foot Complete Right  Result Date: 05/07/2020 Right foot three views: No acute fractures. Status post first and second toe amputations with postsurgical changes. No obvious signs of osteomyelitis    PMFS History: Patient Active Problem List   Diagnosis Date Noted  . Osteomyelitis of second toe of right foot ([place]) 07/12/2019  . Below knee amputation status, left 01/25/2018  . Wound dehiscence, surgical   . Foot amputation status, left 01/03/2018  . Status post left foot surgery 12/15/2017  . Snoring 06/07/2016  .  Chest pain 01/22/2016  . Abnormal nuclear stress test 01/22/2016  . COPD (chronic obstructive pulmonary disease) ([place]) 01/27/2011  . Pre-operative cardiovascular examination 01/27/2011  . Nonspecific abnormal electrocardiogram (ECG) (EKG) 01/27/2011  . Murmur 01/27/2011  . DM2 (diabetes mellitus, type 2) ([place])   . HTN (hypertension)   . HLD (hyperlipidemia)   . Tobacco abuse    Past Medical History:  Diagnosis Date  . Acute renal failure ([place])    in setting of NSAID use and orthopedic surgery 2010  . Anxiety and depression   . Chronic diastolic CHF (congestive heart failure) ([place])    a. Echo 6/17: severe conc LVH, vigorous EF, EF 65-70%, no dynamic  obstruction, no RWMA, Gr 1 DD, mild TR  //  b. LHC 8/17: no sig CAD, LVEDP 28  . COPD (chronic obstructive pulmonary disease) ([place])   . Diabetic ulcer of left foot ([place])   . DM2 (diabetes mellitus, type 2) ([place])   . Family history of early CAD   . Fatty liver   . GERD (gastroesophageal reflux disease)   . History of amputation of foot ([place])    L trans-met // R toe  . History of cardiac catheterization    a. [place] 2002: irregs  //  b. LHC in 8/17: no sig CAD, apical DK, hyperdynamic LV, LVEDP 28  . History of kidney stones   . History of nuclear stress test    a. Nuc 7/17: Overall, intermediate risk nuclear stress test secondary to small size of apical lateral defect and reduced ejection fraction.  EF 43%  . HLD (hyperlipidemia)   . HTN (hypertension)   . Injuries     crushing injury to both his feet in February 2010.   Marland Kitchen Kidney calculi   . Palpitations   . Tobacco abuse     Family History  Problem Relation Age of Onset  . Leukemia Mother 7       died  . Lung cancer Father 22       died  . Heart attack Brother 93  . Heart attack Brother 62  . Hypertension Brother        X3  . Hypertension Sister        X11  . Diabetes Sister   . Stroke Sister   . Diabetes Sister   . Other Brother        Musician accident    Past Surgical History:  Procedure Laterality Date  . AMPUTATION Left 01/03/2018   Procedure: LEFT MIDFOOT AMPUTATION/REVISION MIDAMPUTAION;  Surgeon: Mcarthur Rossetti, MD;  Location: [facility];  Service: Orthopedics;  Laterality: Left;  . AMPUTATION Left 01/25/2018   Procedure: LEFT BELOW KNEE AMPUTATION;  Surgeon: [name], MD;  Location: [facility];  Service: Orthopedics;  Laterality: Left;  . AMPUTATION TOE Right 07/17/2019   Procedure: AMPUTATION RIGHT FOOT 2ND TOE;  Surgeon: Mcarthur Rossetti, MD;  Location: [facility];  Service: Orthopedics;  Laterality: Right;  . BELOW KNEE LEG AMPUTATION Left 01/25/2018  . CARDIAC CATHETERIZATION N/A 01/22/2016     Procedure: Left Heart Cath and Coronary Angiography;  Surgeon: Peter M Martinique, MD;  Location: [facility];  Service: Cardiovascular;  Laterality: N/A;  . FOOT AMPUTATION Bilateral   . I & D EXTREMITY Left 12/15/2017   Procedure: IRRIGATION AND DEBRIDEMENT LEFT FOOT ULCER;  Surgeon: Mcarthur Rossetti, MD;  [facility];  Service: Orthopedics;  Laterality: Left;  . LITHOTRIPSY    . TENDON LENGTHENING  Bilateral    calf  . TONSILLECTOMY     Social History   Occupational History  . Occupation: DISABLED  Tobacco Use  . Smoking status: Current Every Day Smoker    Packs/day: 0.50    Years: 40.00    Pack years: 20.00    Types: Cigarettes    Last attempt to quit: 01/23/2018    Years since quitting: 2.2  . Smokeless tobacco: Never Used  Vaping Use  . Vaping Use: Never used  Substance and Sexual Activity  . Alcohol use: No  . Drug use: No  . Sexual activity: Not on file

## 2020-05-08 ENCOUNTER — Telehealth: Payer: Self-pay | Admitting: Physician Assistant

## 2020-05-08 MED ORDER — SILVER SULFADIAZINE 1 % EX CREA: 1.0000 "application " | TOPICAL_CREAM | Freq: Every day | CUTANEOUS | 0 refills | Status: DC

## 2020-05-08 NOTE — Telephone Encounter (Signed)
I sent some in just now. 

## 2020-05-08 NOTE — Telephone Encounter (Signed)
Pt called stating the pharmacy didn't receive the silvadene and he would like to have it sent to the [facility] [place]

## 2020-05-08 NOTE — Telephone Encounter (Signed)
Please advise 

## 2020-05-22 ENCOUNTER — Encounter: Payer: Self-pay | Admitting: Orthopedic Surgery

## 2020-05-22 ENCOUNTER — Ambulatory Visit: Payer: Medicare HMO | Admitting: Orthopedic Surgery

## 2020-05-22 VITALS — Ht 70.0 in | Wt 187.0 lb

## 2020-05-22 DIAGNOSIS — L97512 Non-pressure chronic ulcer of other part of right foot with fat layer exposed: Secondary | ICD-10-CM

## 2020-05-22 DIAGNOSIS — S98111A Complete traumatic amputation of right great toe, initial encounter: Secondary | ICD-10-CM

## 2020-05-26 ENCOUNTER — Encounter: Payer: Self-pay | Admitting: Orthopedic Surgery

## 2020-05-26 NOTE — Progress Notes (Signed)
Office Visit Note   Patient: Troy [name]           Date of Birth: 26-Jun-1960           MRN: [id number] Visit Date: 05/22/2020              Requested by: [name], MD [address] [place],  [place] [address] PCP: [name], MD  Chief Complaint  Patient presents with  . Right Foot - Follow-up      HPI: Patient is a 59 year old gentleman who presents in follow-up for right foot ulcer he is status post great toe and second toe amputation.  Patient states that he is in regular shoewear has some pink drainage he states he use got 3 days left of doxycycline.  He states he has a good prosthetic fit for the left transtibial amputation.  Assessment & Plan: Visit Diagnoses:  1. Foot ulcer with fat layer exposed, right ([place])   2. Amputation of right great toe ([place])     Plan: Patient does have new orthotics and a donut was placed to unload the pressure for the first metatarsal head.  Follow-Up Instructions: Return in about 4 weeks (around 06/19/2020).   Ortho Exam  Patient is alert, oriented, no adenopathy, well-dressed, normal affect, normal respiratory effort. Examination patient has a well fitting prosthesis on the left he has a Wagner grade 1 ulcer beneath the first metatarsal head of the right great toe.  After informed consent a 10 blade knife was used to debride the skin and soft tissue back to healthy viable granulation tissue the ulcer is 2 cm in diameter 2 mm deep silver nitrate was used for hemostasis there is no exposed bone or tendon.  Imaging: No results found. No images are attached to the encounter.  Labs: Lab Results  Component Value Date   HGBA1C 11.0 (H) 12/13/2017     Lab Results  Component Value Date   ALBUMIN 4.0 10/26/2018   ALBUMIN 4.2 10/22/2018   ALBUMIN 3.8 05/09/2011    Lab Results  Component Value Date   MG 1.4 (L) 10/22/2018   MG 1.9 08/14/2008   MG 2.2 08/11/2008   No results found for: VD25OH  No results found for:  PREALBUMIN CBC EXTENDED Latest Ref Rng & Units 07/13/2019 10/26/2018 10/25/2018  WBC 4.0 - 10.5 K/uL 15.5(H) 10.6(H) 12.7(H)  RBC 4.22 - 5.81 MIL/uL 5.58 5.84(H) 6.11(H)  HGB 13.0 - 17.0 g/dL 16.1 16.3 17.2(H)  HCT 39 - 52 % 47.1 50.2 52.7(H)  PLT 150 - 400 K/uL 213 220 255  NEUTROABS 1.7 - 7.7 K/uL - 7.0 -  LYMPHSABS 0.7 - 4.0 K/uL - 2.7 -     Body mass index is 26.83 kg/m.  Orders:  No orders of the defined types were placed in this encounter.  No orders of the defined types were placed in this encounter.    Procedures: No procedures performed  Clinical Data: No additional findings.  ROS:  All other systems negative, except as noted in the HPI. Review of Systems  Objective: Vital Signs: Ht _0  (1.778 m)   Wt 187 lb (84.8 kg)   BMI 26.83 kg/m   Specialty Comments:  No specialty comments available.  PMFS History: Patient Active Problem List   Diagnosis Date Noted  . Osteomyelitis of second toe of right foot ([place]) 07/12/2019  . Below knee amputation status, left 01/25/2018  . Wound dehiscence, surgical   . Foot amputation status, left 01/03/2018  .  Status post left foot surgery 12/15/2017  . GERD (gastroesophageal reflux disease) 09/26/2017  . Hyperlipidemia associated with type 2 diabetes mellitus ([place]) 07/08/2017  . Snoring 06/07/2016  . Chest pain 01/22/2016  . Abnormal nuclear stress test 01/22/2016  . Pain, chronic due to trauma 07/04/2012  . Complex regional pain syndrome I of lower limb 07/04/2012  . COPD (chronic obstructive pulmonary disease) ([place]) 01/27/2011  . Pre-operative cardiovascular examination 01/27/2011  . Nonspecific abnormal electrocardiogram (ECG) (EKG) 01/27/2011  . Murmur 01/27/2011  . DM2 (diabetes mellitus, type 2) ([place])   . HTN (hypertension)   . HLD (hyperlipidemia)   . Tobacco abuse    Past Medical History:  Diagnosis Date  . Acute renal failure ([place])    in setting of NSAID use and orthopedic surgery 2010  . Anxiety and  depression   . Chronic diastolic CHF (congestive heart failure) ([place])    a. Echo 6/17: severe conc LVH, vigorous EF, EF 65-70%, no dynamic obstruction, no RWMA, Gr 1 DD, mild TR  //  b. LHC 8/17: no sig CAD, LVEDP 28  . COPD (chronic obstructive pulmonary disease) ([place])   . Diabetic ulcer of left foot ([place])   . DM2 (diabetes mellitus, type 2) ([place])   . Family history of early CAD   . Fatty liver   . GERD (gastroesophageal reflux disease)   . History of amputation of foot ([place])    L trans-met // R toe  . History of cardiac catheterization    a. [place] 2002: irregs  //  b. LHC in 8/17: no sig CAD, apical DK, hyperdynamic LV, LVEDP 28  . History of kidney stones   . History of nuclear stress test    a. Nuc 7/17: Overall, intermediate risk nuclear stress test secondary to small size of apical lateral defect and reduced ejection fraction.  EF 43%  . HLD (hyperlipidemia)   . HTN (hypertension)   . Injuries     crushing injury to both his feet in February 2010.   Marland Kitchen Kidney calculi   . Palpitations   . Tobacco abuse     Family History  Problem Relation Age of Onset  . Leukemia Mother 28       died  . Lung cancer Father 23       died  . Heart attack Brother 45  . Heart attack Brother 46  . Hypertension Brother        X3  . Hypertension Sister        X34  . Diabetes Sister   . Stroke Sister   . Diabetes Sister   . Other Brother        Musician accident    Past Surgical History:  Procedure Laterality Date  . AMPUTATION Left 01/03/2018   Procedure: LEFT MIDFOOT AMPUTATION/REVISION MIDAMPUTAION;  Surgeon: Mcarthur Rossetti, MD;  Location: [facility];  Service: Orthopedics;  Laterality: Left;  . AMPUTATION Left 01/25/2018   Procedure: LEFT BELOW KNEE AMPUTATION;  Surgeon: [name], MD;  Location: [facility];  Service: Orthopedics;  Laterality: Left;  . AMPUTATION TOE Right 07/17/2019   Procedure: AMPUTATION RIGHT FOOT 2ND TOE;  Surgeon: Mcarthur Rossetti, MD;  Location: [facility];  Service: Orthopedics;  Laterality: Right;  . BELOW KNEE LEG AMPUTATION Left 01/25/2018  . CARDIAC CATHETERIZATION N/A 01/22/2016   Procedure: Left Heart Cath and Coronary Angiography;  Surgeon: Peter M Martinique, MD;  Location: [facility];  Service: Cardiovascular;  Laterality: N/A;  .  FOOT AMPUTATION Bilateral   . I & D EXTREMITY Left 12/15/2017   Procedure: IRRIGATION AND DEBRIDEMENT LEFT FOOT ULCER;  Surgeon: Mcarthur Rossetti, MD;  [facility];  Service: Orthopedics;  Laterality: Left;  . LITHOTRIPSY    . TENDON LENGTHENING Bilateral    calf  . TONSILLECTOMY     Social History   Occupational History  . Occupation: DISABLED  Tobacco Use  . Smoking status: Current Every Day Smoker    Packs/day: 0.50    Years: 40.00    Pack years: 20.00    Types: Cigarettes    Last attempt to quit: 01/23/2018    Years since quitting: 2.3  . Smokeless tobacco: Never Used  Vaping Use  . Vaping Use: Never used  Substance and Sexual Activity  . Alcohol use: No  . Drug use: No  . Sexual activity: Not on file

## 2020-06-18 ENCOUNTER — Encounter: Payer: Self-pay | Admitting: Physician Assistant

## 2020-06-18 ENCOUNTER — Ambulatory Visit: Payer: Medicare HMO | Admitting: Physician Assistant

## 2020-06-18 DIAGNOSIS — L97512 Non-pressure chronic ulcer of other part of right foot with fat layer exposed: Secondary | ICD-10-CM | POA: Diagnosis not present

## 2020-06-18 NOTE — Progress Notes (Signed)
Office Visit Note   Patient: Troy [name]           Date of Birth: 1960-11-24           MRN: [id number] Visit Date: 06/18/2020              Requested by: [name], MD [address] [place],  [place] [address] PCP: [name], MD  No chief complaint on file.     HPI: Is here in follow-up for his first metatarsal plantar surface ulcer.  His wife has been doing Silvadene dressing changes as she finds it is helped more.  He feels he is improving he is wearing an orthotic with pressure relief in this area  Assessment & Plan: Visit Diagnoses: No diagnosis found.  Plan: Continue current dressing changes follow-up in 2 weeks  Follow-Up Instructions: No follow-ups on file.   Ortho Exam  Patient is alert, oriented, no adenopathy, well-dressed, normal affect, normal respiratory effort. Ulcer does not have any surrounding cellulitis.  There is no foul odor no drainage.  It does not probe deeply.  It is approximately a centimeter in diameter with surrounding macerated skin and callus.  After verbal consent was obtained this was debrided.  Bloody surfaces were touched with silver nitrate no ascending cellulitis  Imaging: No results found. No images are attached to the encounter.  Labs: Lab Results  Component Value Date   HGBA1C 11.0 (H) 12/13/2017     Lab Results  Component Value Date   ALBUMIN 4.0 10/26/2018   ALBUMIN 4.2 10/22/2018   ALBUMIN 3.8 05/09/2011    Lab Results  Component Value Date   MG 1.4 (L) 10/22/2018   MG 1.9 08/14/2008   MG 2.2 08/11/2008   No results found for: VD25OH  No results found for: PREALBUMIN CBC EXTENDED Latest Ref Rng & Units 07/13/2019 10/26/2018 10/25/2018  WBC 4.0 - 10.5 K/uL 15.5(H) 10.6(H) 12.7(H)  RBC 4.22 - 5.81 MIL/uL 5.58 5.84(H) 6.11(H)  HGB 13.0 - 17.0 g/dL 16.1 16.3 17.2(H)  HCT 39.0 - 52.0 % 47.1 50.2 52.7(H)  PLT 150 - 400 K/uL 213 220 255  NEUTROABS 1.7 - 7.7 K/uL - 7.0 -  LYMPHSABS 0.7 - 4.0 K/uL - 2.7 -      There is no height or weight on file to calculate BMI.  Orders:  No orders of the defined types were placed in this encounter.  No orders of the defined types were placed in this encounter.    Procedures: No procedures performed  Clinical Data: No additional findings.  ROS:  All other systems negative, except as noted in the HPI. Review of Systems  Objective: Vital Signs: There were no vitals taken for this visit.  Specialty Comments:  No specialty comments available.  PMFS History: Patient Active Problem List   Diagnosis Date Noted  . Osteomyelitis of second toe of right foot ([place]) 07/12/2019  . Below knee amputation status, left 01/25/2018  . Wound dehiscence, surgical   . Foot amputation status, left 01/03/2018  . Status post left foot surgery 12/15/2017  . GERD (gastroesophageal reflux disease) 09/26/2017  . Hyperlipidemia associated with type 2 diabetes mellitus ([place]) 07/08/2017  . Snoring 06/07/2016  . Chest pain 01/22/2016  . Abnormal nuclear stress test 01/22/2016  . Pain, chronic due to trauma 07/04/2012  . Complex regional pain syndrome I of lower limb 07/04/2012  . COPD (chronic obstructive pulmonary disease) ([place]) 01/27/2011  . Pre-operative cardiovascular examination 01/27/2011  . Nonspecific abnormal electrocardiogram (ECG) (  EKG) 01/27/2011  . Murmur 01/27/2011  . DM2 (diabetes mellitus, type 2) ([place])   . HTN (hypertension)   . HLD (hyperlipidemia)   . Tobacco abuse    Past Medical History:  Diagnosis Date  . Acute renal failure ([place])    in setting of NSAID use and orthopedic surgery 2010  . Anxiety and depression   . Chronic diastolic CHF (congestive heart failure) ([place])    a. Echo 6/17: severe conc LVH, vigorous EF, EF 65-70%, no dynamic obstruction, no RWMA, Gr 1 DD, mild TR  //  b. LHC 8/17: no sig CAD, LVEDP 28  . COPD (chronic obstructive pulmonary disease) ([place])   . Diabetic ulcer of left foot ([place])   . DM2 (diabetes mellitus,  type 2) ([place])   . Family history of early CAD   . Fatty liver   . GERD (gastroesophageal reflux disease)   . History of amputation of foot ([place])    L trans-met // R toe  . History of cardiac catheterization    a. [place] 2002: irregs  //  b. LHC in 8/17: no sig CAD, apical DK, hyperdynamic LV, LVEDP 28  . History of kidney stones   . History of nuclear stress test    a. Nuc 7/17: Overall, intermediate risk nuclear stress test secondary to small size of apical lateral defect and reduced ejection fraction.  EF 43%  . HLD (hyperlipidemia)   . HTN (hypertension)   . Injuries     crushing injury to both his feet in February 2010.   Marland Kitchen Kidney calculi   . Palpitations   . Tobacco abuse     Family History  Problem Relation Age of Onset  . Leukemia Mother 21       died  . Lung cancer Father 25       died  . Heart attack Brother 47  . Heart attack Brother 73  . Hypertension Brother        X3  . Hypertension Sister        X28  . Diabetes Sister   . Stroke Sister   . Diabetes Sister   . Other Brother        Musician accident    Past Surgical History:  Procedure Laterality Date  . AMPUTATION Left 01/03/2018   Procedure: LEFT MIDFOOT AMPUTATION/REVISION MIDAMPUTAION;  Surgeon: Mcarthur Rossetti, MD;  Location: [facility];  Service: Orthopedics;  Laterality: Left;  . AMPUTATION Left 01/25/2018   Procedure: LEFT BELOW KNEE AMPUTATION;  Surgeon: [name], MD;  Location: [facility];  Service: Orthopedics;  Laterality: Left;  . AMPUTATION TOE Right 07/17/2019   Procedure: AMPUTATION RIGHT FOOT 2ND TOE;  Surgeon: Mcarthur Rossetti, MD;  Location: [facility];  Service: Orthopedics;  Laterality: Right;  . BELOW KNEE LEG AMPUTATION Left 01/25/2018  . CARDIAC CATHETERIZATION N/A 01/22/2016   Procedure: Left Heart Cath and Coronary Angiography;  Surgeon: Peter M Martinique, MD;  Location: [facility];  Service: Cardiovascular;  Laterality: N/A;  . FOOT AMPUTATION Bilateral   . I & D  EXTREMITY Left 12/15/2017   Procedure: IRRIGATION AND DEBRIDEMENT LEFT FOOT ULCER;  Surgeon: Mcarthur Rossetti, MD;  [facility];  Service: Orthopedics;  Laterality: Left;  . LITHOTRIPSY    . TENDON LENGTHENING Bilateral    calf  . TONSILLECTOMY     Social History   Occupational History  . Occupation: DISABLED  Tobacco Use  . Smoking status: Current Every Day  Smoker    Packs/day: 0.50    Years: 40.00    Pack years: 20.00    Types: Cigarettes    Last attempt to quit: 01/23/2018    Years since quitting: 2.4  . Smokeless tobacco: Never Used  Vaping Use  . Vaping Use: Never used  Substance and Sexual Activity  . Alcohol use: No  . Drug use: No  . Sexual activity: Not on file

## 2020-07-04 ENCOUNTER — Ambulatory Visit: Payer: Medicare HMO | Admitting: Physician Assistant

## 2020-07-15 ENCOUNTER — Encounter: Payer: Self-pay | Admitting: Orthopedic Surgery

## 2020-07-15 ENCOUNTER — Ambulatory Visit: Payer: Medicare HMO | Admitting: Physician Assistant

## 2020-07-15 DIAGNOSIS — Z89421 Acquired absence of other right toe(s): Secondary | ICD-10-CM | POA: Diagnosis not present

## 2020-07-15 MED ORDER — DOXYCYCLINE HYCLATE 100 MG PO TABS: 100.0000 mg | ORAL_TABLET | Freq: Two times a day (BID) | ORAL | 0 refills | Status: DC

## 2020-07-15 NOTE — Progress Notes (Signed)
Office Visit Note   Patient: Troy [name]           Date of Birth: 1960-10-22           MRN: [id number] Visit Date: 07/15/2020              Requested by: [name], MD [address] [place],  [place] [address] PCP: [name], MD  Chief Complaint  Patient presents with  . Left Leg - Follow-up, Wound Check  . Right Foot - Follow-up, Wound Check      HPI: Patient presents today for 2 separate issues.  He is status post left below-knee amputation.  He has been noticing that his prosthetic has an area of pressure and over the course the last 2 weeks has developed a necrotic small ulcer on the anterior part of his shin.  Secondly he is here for a right foot plantar ulcer.  His wife is concerned that this too is gotten deeper he otherwise feels well.  Assessment & Plan: Visit Diagnoses: No diagnosis found.  Plan: Patient was seen today directly by Dr. Sharol Given who debrided the ulcer on the right foot to bleeding surfaces.  This was coagulated with silver nitrate stick.  On the left we have given him prescription to get shrinkers to wear directly on the skin.  He should not wear his prosthetic until this is healed.  He does have a motorized scooter so this will be a bit easier for him.  Also we will place him on doxycycline and he is to follow-up in a week  Follow-Up Instructions: No follow-ups on file.   Ortho Exam  Patient is alert, oriented, no adenopathy, well-dressed, normal affect, normal respiratory effort. Left below-knee amputation stump he does have an eschar on the anterior tibia.  This is tender to palpation with some surrounding erythema.  There is no ascending cellulitis or soft tissue swelling.  There is no fluctuance. Right great toe ulcer beneath on the plantar surface of the forefoot.  No foul odor no drainage this does probe somewhat deeply but not to bone.  Was debrided to healthy surfaces and a Band-Aid was applied with mupirocin no ascending cellulitis was  noted  Imaging: No results found. No images are attached to the encounter.  Labs: Lab Results  Component Value Date   HGBA1C 11.0 (H) 12/13/2017     Lab Results  Component Value Date   ALBUMIN 4.0 10/26/2018   ALBUMIN 4.2 10/22/2018   ALBUMIN 3.8 05/09/2011    Lab Results  Component Value Date   MG 1.4 (L) 10/22/2018   MG 1.9 08/14/2008   MG 2.2 08/11/2008   No results found for: VD25OH  No results found for: PREALBUMIN CBC EXTENDED Latest Ref Rng & Units 07/13/2019 10/26/2018 10/25/2018  WBC 4.0 - 10.5 K/uL 15.5(H) 10.6(H) 12.7(H)  RBC 4.22 - 5.81 MIL/uL 5.58 5.84(H) 6.11(H)  HGB 13.0 - 17.0 g/dL 16.1 16.3 17.2(H)  HCT 39.0 - 52.0 % 47.1 50.2 52.7(H)  PLT 150 - 400 K/uL 213 220 255  NEUTROABS 1.7 - 7.7 K/uL - 7.0 -  LYMPHSABS 0.7 - 4.0 K/uL - 2.7 -     There is no height or weight on file to calculate BMI.  Orders:  No orders of the defined types were placed in this encounter.  Meds ordered this encounter  Medications  . doxycycline (VIBRA-TABS) 100 MG tablet    Sig: Take 1 tablet (100 mg total) by mouth 2 (two) times daily.  Dispense:  60 tablet    Refill:  0     Procedures: No procedures performed  Clinical Data: No additional findings.  ROS:  All other systems negative, except as noted in the HPI. Review of Systems  Objective: Vital Signs: There were no vitals taken for this visit.  Specialty Comments:  No specialty comments available.  PMFS History: Patient Active Problem List   Diagnosis Date Noted  . Osteomyelitis of second toe of right foot ([place]) 07/12/2019  . Below knee amputation status, left 01/25/2018  . Wound dehiscence, surgical   . Foot amputation status, left 01/03/2018  . Status post left foot surgery 12/15/2017  . GERD (gastroesophageal reflux disease) 09/26/2017  . Hyperlipidemia associated with type 2 diabetes mellitus ([place]) 07/08/2017  . Snoring 06/07/2016  . Chest pain 01/22/2016  . Abnormal nuclear stress test  01/22/2016  . Pain, chronic due to trauma 07/04/2012  . Complex regional pain syndrome I of lower limb 07/04/2012  . COPD (chronic obstructive pulmonary disease) ([place]) 01/27/2011  . Pre-operative cardiovascular examination 01/27/2011  . Nonspecific abnormal electrocardiogram (ECG) (EKG) 01/27/2011  . Murmur 01/27/2011  . DM2 (diabetes mellitus, type 2) ([place])   . HTN (hypertension)   . HLD (hyperlipidemia)   . Tobacco abuse    Past Medical History:  Diagnosis Date  . Acute renal failure ([place])    in setting of NSAID use and orthopedic surgery 2010  . Anxiety and depression   . Chronic diastolic CHF (congestive heart failure) ([place])    a. Echo 6/17: severe conc LVH, vigorous EF, EF 65-70%, no dynamic obstruction, no RWMA, Gr 1 DD, mild TR  //  b. LHC 8/17: no sig CAD, LVEDP 28  . COPD (chronic obstructive pulmonary disease) ([place])   . Diabetic ulcer of left foot ([place])   . DM2 (diabetes mellitus, type 2) ([place])   . Family history of early CAD   . Fatty liver   . GERD (gastroesophageal reflux disease)   . History of amputation of foot ([place])    L trans-met // R toe  . History of cardiac catheterization    a. [place] 2002: irregs  //  b. LHC in 8/17: no sig CAD, apical DK, hyperdynamic LV, LVEDP 28  . History of kidney stones   . History of nuclear stress test    a. Nuc 7/17: Overall, intermediate risk nuclear stress test secondary to small size of apical lateral defect and reduced ejection fraction.  EF 43%  . HLD (hyperlipidemia)   . HTN (hypertension)   . Injuries     crushing injury to both his feet in February 2010.   Marland Kitchen Kidney calculi   . Palpitations   . Tobacco abuse     Family History  Problem Relation Age of Onset  . Leukemia Mother 51       died  . Lung cancer Father 45       died  . Heart attack Brother 40  . Heart attack Brother 20  . Hypertension Brother        X3  . Hypertension Sister        X56  . Diabetes Sister   . Stroke Sister   . Diabetes Sister   . Other  Brother        Musician accident    Past Surgical History:  Procedure Laterality Date  . AMPUTATION Left 01/03/2018   Procedure: LEFT MIDFOOT AMPUTATION/REVISION MIDAMPUTAION;  Surgeon: Mcarthur Rossetti, MD;  Location: [facility];  Service: Orthopedics;  Laterality: Left;  . AMPUTATION Left 01/25/2018   Procedure: LEFT BELOW KNEE AMPUTATION;  Surgeon: [name], MD;  Location: [facility];  Service: Orthopedics;  Laterality: Left;  . AMPUTATION TOE Right 07/17/2019   Procedure: AMPUTATION RIGHT FOOT 2ND TOE;  Surgeon: Mcarthur Rossetti, MD;  Location: [facility];  Service: Orthopedics;  Laterality: Right;  . BELOW KNEE LEG AMPUTATION Left 01/25/2018  . CARDIAC CATHETERIZATION N/A 01/22/2016   Procedure: Left Heart Cath and Coronary Angiography;  Surgeon: Peter M Martinique, MD;  Location: [facility];  Service: Cardiovascular;  Laterality: N/A;  . FOOT AMPUTATION Bilateral   . I & D EXTREMITY Left 12/15/2017   Procedure: IRRIGATION AND DEBRIDEMENT LEFT FOOT ULCER;  Surgeon: Mcarthur Rossetti, MD;  [facility];  Service: Orthopedics;  Laterality: Left;  . LITHOTRIPSY    . TENDON LENGTHENING Bilateral    calf  . TONSILLECTOMY     Social History   Occupational History  . Occupation: DISABLED  Tobacco Use  . Smoking status: Current Every Day Smoker    Packs/day: 0.50    Years: 40.00    Pack years: 20.00    Types: Cigarettes    Last attempt to quit: 01/23/2018    Years since quitting: 2.4  . Smokeless tobacco: Never Used  Vaping Use  . Vaping Use: Never used  Substance and Sexual Activity  . Alcohol use: No  . Drug use: No  . Sexual activity: Not on file

## 2020-07-24 ENCOUNTER — Other Ambulatory Visit: Payer: Self-pay | Admitting: Physician Assistant

## 2020-07-24 ENCOUNTER — Ambulatory Visit (INDEPENDENT_AMBULATORY_CARE_PROVIDER_SITE_OTHER): Payer: Medicare HMO | Admitting: Orthopedic Surgery

## 2020-07-24 ENCOUNTER — Other Ambulatory Visit (HOSPITAL_COMMUNITY)
Admission: RE | Admit: 2020-07-24 | Discharge: 2020-07-24 | Disposition: A | Discharge status: Home / Self Care | Payer: Medicare HMO | Source: Ambulatory Visit | Attending: Orthopedic Surgery | Admitting: Orthopedic Surgery

## 2020-07-24 ENCOUNTER — Encounter: Payer: Self-pay | Admitting: Orthopedic Surgery

## 2020-07-24 ENCOUNTER — Other Ambulatory Visit: Payer: Self-pay

## 2020-07-24 ENCOUNTER — Ambulatory Visit (INDEPENDENT_AMBULATORY_CARE_PROVIDER_SITE_OTHER): Payer: Medicare HMO

## 2020-07-24 ENCOUNTER — Encounter (HOSPITAL_COMMUNITY): Payer: Self-pay | Admitting: Orthopedic Surgery

## 2020-07-24 DIAGNOSIS — Z89512 Acquired absence of left leg below knee: Secondary | ICD-10-CM | POA: Diagnosis not present

## 2020-07-24 DIAGNOSIS — Z01812 Encounter for preprocedural laboratory examination: Secondary | ICD-10-CM | POA: Diagnosis present

## 2020-07-24 DIAGNOSIS — L97924 Non-pressure chronic ulcer of unspecified part of left lower leg with necrosis of bone: Secondary | ICD-10-CM | POA: Diagnosis not present

## 2020-07-24 DIAGNOSIS — Z20822 Contact with and (suspected) exposure to covid-19: Secondary | ICD-10-CM | POA: Insufficient documentation

## 2020-07-24 DIAGNOSIS — L97514 Non-pressure chronic ulcer of other part of right foot with necrosis of bone: Secondary | ICD-10-CM

## 2020-07-24 DIAGNOSIS — M79671 Pain in right foot: Secondary | ICD-10-CM

## 2020-07-24 DIAGNOSIS — S88112A Complete traumatic amputation at level between knee and ankle, left lower leg, initial encounter: Secondary | ICD-10-CM

## 2020-07-24 LAB — SARS CORONAVIRUS 2 (TAT 6-24 HRS): SARS Coronavirus 2: NEGATIVE

## 2020-07-24 NOTE — Progress Notes (Signed)
Office Visit Note   Patient: Troy [name]           Date of Birth: 12/09/1960           MRN: [id number] Visit Date: 07/24/2020              Requested by: [name], MD [address] [place],  [place] [address] PCP: [name], MD  Chief Complaint  Patient presents with  . Right Foot - Follow-up    Ulcer hx 2nd toe amputation 06/2019  . Left Leg - Follow-up    Hx BKA 01/2018      HPI: Patient is a 60 year old gentleman who presents with 2 separate issues #1 he has developed a pressure ulcer over the tibial tubercle from his prosthesis.  Patient has discontinued use of the prosthesis on the left he has been on doxycycline and presents in follow-up for evaluation of this pressure ulcer.  Patient has also had a chronic ulcer beneath the first metatarsal head of the right foot status post great toe amputation.  Assessment & Plan: Visit Diagnoses:  1. Pain in right foot   2. Below-knee amputation of left lower extremity ([place])   3. Skin ulcer of left lower leg with necrosis of bone ([place])   4. Ulcer of right foot with necrosis of bone ([place])     Plan: With the proximal tibial ulcer over the medial tibial plateau which has developed increased necrotic tissue increase cellulitis we will plan for surgical intervention for debridement placement of skin graft and wound VAC therapy will obtain deep tissue cultures and adjust antibiotics accordingly.  With the chronic ulcer beneath the first metatarsal head of the right foot.  Patient will need to undergo surgical intervention for excision of the infected bone and we will proceed with this once he can ambulate on the prosthesis on the left.  Follow-Up Instructions: No follow-ups on file.   Ortho Exam  Patient is alert, oriented, no adenopathy, well-dressed, normal affect, normal respiratory effort. Examination patient has a good dorsalis pedis pulse on the right left transtibial amputation he has a ulcer over the medial tibial  plateau with necrotic tissue there is an area of cellulitis with induration 5 cm in diameter there is a necrotic ulcer that is 2 cm in diameter 5 mm deep that extends down to bone.  Imaging: XR Foot 2 Views Right  Result Date: 07/24/2020 2 view radiographs of the right foot shows cystic destructive changes of the first metatarsal head consistent with chronic osteomyelitis.  No images are attached to the encounter.  Labs: Lab Results  Component Value Date   HGBA1C 11.0 (H) 12/13/2017     Lab Results  Component Value Date   ALBUMIN 4.0 10/26/2018   ALBUMIN 4.2 10/22/2018   ALBUMIN 3.8 05/09/2011    Lab Results  Component Value Date   MG 1.4 (L) 10/22/2018   MG 1.9 08/14/2008   MG 2.2 08/11/2008   No results found for: VD25OH  No results found for: PREALBUMIN CBC EXTENDED Latest Ref Rng & Units 07/13/2019 10/26/2018 10/25/2018  WBC 4.0 - 10.5 K/uL 15.5(H) 10.6(H) 12.7(H)  RBC 4.22 - 5.81 MIL/uL 5.58 5.84(H) 6.11(H)  HGB 13.0 - 17.0 g/dL 16.1 16.3 17.2(H)  HCT 39.0 - 52.0 % 47.1 50.2 52.7(H)  PLT 150 - 400 K/uL 213 220 255  NEUTROABS 1.7 - 7.7 K/uL - 7.0 -  LYMPHSABS 0.7 - 4.0 K/uL - 2.7 -     There is no height  or weight on file to calculate BMI.  Orders:  Orders Placed This Encounter  Procedures  . XR Foot 2 Views Right   No orders of the defined types were placed in this encounter.    Procedures: No procedures performed  Clinical Data: No additional findings.  ROS:  All other systems negative, except as noted in the HPI. Review of Systems  Objective: Vital Signs: There were no vitals taken for this visit.  Specialty Comments:  No specialty comments available.  PMFS History: Patient Active Problem List   Diagnosis Date Noted  . Osteomyelitis of second toe of right foot ([place]) 07/12/2019  . Below knee amputation status, left 01/25/2018  . Wound dehiscence, surgical   . Foot amputation status, left 01/03/2018  . Status post left foot surgery  12/15/2017  . GERD (gastroesophageal reflux disease) 09/26/2017  . Hyperlipidemia associated with type 2 diabetes mellitus ([place]) 07/08/2017  . Snoring 06/07/2016  . Chest pain 01/22/2016  . Abnormal nuclear stress test 01/22/2016  . Pain, chronic due to trauma 07/04/2012  . Complex regional pain syndrome I of lower limb 07/04/2012  . COPD (chronic obstructive pulmonary disease) ([place]) 01/27/2011  . Pre-operative cardiovascular examination 01/27/2011  . Nonspecific abnormal electrocardiogram (ECG) (EKG) 01/27/2011  . Murmur 01/27/2011  . DM2 (diabetes mellitus, type 2) ([place])   . HTN (hypertension)   . HLD (hyperlipidemia)   . Tobacco abuse    Past Medical History:  Diagnosis Date  . Acute renal failure ([place])    in setting of NSAID use and orthopedic surgery 2010  . Anxiety and depression   . Chronic diastolic CHF (congestive heart failure) ([place])    a. Echo 6/17: severe conc LVH, vigorous EF, EF 65-70%, no dynamic obstruction, no RWMA, Gr 1 DD, mild TR  //  b. LHC 8/17: no sig CAD, LVEDP 28  . COPD (chronic obstructive pulmonary disease) ([place])   . Diabetic ulcer of left foot ([place])   . DM2 (diabetes mellitus, type 2) ([place])   . Family history of early CAD   . Fatty liver   . GERD (gastroesophageal reflux disease)   . History of amputation of foot ([place])    L trans-met // R toe  . History of cardiac catheterization    a. [place] 2002: irregs  //  b. LHC in 8/17: no sig CAD, apical DK, hyperdynamic LV, LVEDP 28  . History of kidney stones   . History of nuclear stress test    a. Nuc 7/17: Overall, intermediate risk nuclear stress test secondary to small size of apical lateral defect and reduced ejection fraction.  EF 43%  . HLD (hyperlipidemia)   . HTN (hypertension)   . Injuries     crushing injury to both his feet in February 2010.   Marland Kitchen Kidney calculi   . Palpitations   . Tobacco abuse     Family History  Problem Relation Age of Onset  . Leukemia Mother 98       died  . Lung  cancer Father 83       died  . Heart attack Brother 10  . Heart attack Brother 39  . Hypertension Brother        X3  . Hypertension Sister        X1  . Diabetes Sister   . Stroke Sister   . Diabetes Sister   . Other Brother        Car accident    Past Surgical History:  Procedure  Laterality Date  . AMPUTATION Left 01/03/2018   Procedure: LEFT MIDFOOT AMPUTATION/REVISION MIDAMPUTAION;  Surgeon: Mcarthur Rossetti, MD;  Location: [facility];  Service: Orthopedics;  Laterality: Left;  . AMPUTATION Left 01/25/2018   Procedure: LEFT BELOW KNEE AMPUTATION;  Surgeon: [name], MD;  Location: [facility];  Service: Orthopedics;  Laterality: Left;  . AMPUTATION TOE Right 07/17/2019   Procedure: AMPUTATION RIGHT FOOT 2ND TOE;  Surgeon: Mcarthur Rossetti, MD;  Location: [facility];  Service: Orthopedics;  Laterality: Right;  . BELOW KNEE LEG AMPUTATION Left 01/25/2018  . CARDIAC CATHETERIZATION N/A 01/22/2016   Procedure: Left Heart Cath and Coronary Angiography;  Surgeon: Peter M Martinique, MD;  Location: [facility];  Service: Cardiovascular;  Laterality: N/A;  . FOOT AMPUTATION Bilateral   . I & D EXTREMITY Left 12/15/2017   Procedure: IRRIGATION AND DEBRIDEMENT LEFT FOOT ULCER;  Surgeon: Mcarthur Rossetti, MD;  [facility];  Service: Orthopedics;  Laterality: Left;  . LITHOTRIPSY    . TENDON LENGTHENING Bilateral    calf  . TONSILLECTOMY     Social History   Occupational History  . Occupation: DISABLED  Tobacco Use  . Smoking status: Current Every Day Smoker    Packs/day: 0.50    Years: 40.00    Pack years: 20.00    Types: Cigarettes    Last attempt to quit: 01/23/2018    Years since quitting: 2.5  . Smokeless tobacco: Never Used  Vaping Use  . Vaping Use: Never used  Substance and Sexual Activity  . Alcohol use: No  . Drug use: No  . Sexual activity: Not on file

## 2020-07-24 NOTE — Progress Notes (Addendum)
I spoke to Troy [name], per instructions.  Troy Troy [name] said that Troy [name] would not be able to, "he gets things mixed up."  Troy. Troy [name] reports that [name] does/will not check CBG, "patient can't stand needles." DM medications were changed 11/18/222 by Dr.Levy, Endocrinologist. [name] point of care  Hemoglobin A1C was 13.9  on 05/08/20. I instructed Troy [name] to have [name] to not take Troy [name] tonight or in am. ( patient usually takes this medication, but did not today, had to rush to get to Dr. Gabriel Carina.)  Troy Troy [name] denies any s/s of Covid in the home and no one has been exposed to   Covid in her household, to there knowledge.  Patient denies any known exposure to Covid. [name] was tested for Covid today and is in  [place] with his family.  [name] has drop several of his medications, because he did not want to take them.   I asked Troy Caldwell, PA-C to review [name] chart. Troy [name] reviewed patient's chart and said that no further cardiac studies  have been done since he reviewed last time.

## 2020-07-25 ENCOUNTER — Ambulatory Visit (HOSPITAL_COMMUNITY): Payer: Medicare HMO | Admitting: Physician Assistant

## 2020-07-25 ENCOUNTER — Other Ambulatory Visit: Payer: Self-pay

## 2020-07-25 ENCOUNTER — Encounter (HOSPITAL_COMMUNITY): Payer: Self-pay | Admitting: Orthopedic Surgery

## 2020-07-25 ENCOUNTER — Ambulatory Visit (HOSPITAL_COMMUNITY)
Admission: RE | Admit: 2020-07-25 | Discharge: 2020-07-25 | Disposition: A | Discharge status: Home / Self Care | Payer: Medicare HMO | Attending: Orthopedic Surgery | Admitting: Orthopedic Surgery

## 2020-07-25 ENCOUNTER — Encounter (HOSPITAL_COMMUNITY)
Admission: RE | Disposition: A | Discharge status: Home / Self Care | Payer: Self-pay | Source: Home / Self Care | Attending: Orthopedic Surgery

## 2020-07-25 DIAGNOSIS — Z8249 Family history of ischemic heart disease and other diseases of the circulatory system: Secondary | ICD-10-CM | POA: Diagnosis not present

## 2020-07-25 DIAGNOSIS — L02416 Cutaneous abscess of left lower limb: Secondary | ICD-10-CM

## 2020-07-25 DIAGNOSIS — Z89512 Acquired absence of left leg below knee: Secondary | ICD-10-CM | POA: Diagnosis not present

## 2020-07-25 DIAGNOSIS — I5032 Chronic diastolic (congestive) heart failure: Secondary | ICD-10-CM | POA: Diagnosis not present

## 2020-07-25 DIAGNOSIS — Z823 Family history of stroke: Secondary | ICD-10-CM | POA: Diagnosis not present

## 2020-07-25 DIAGNOSIS — L97514 Non-pressure chronic ulcer of other part of right foot with necrosis of bone: Secondary | ICD-10-CM | POA: Insufficient documentation

## 2020-07-25 DIAGNOSIS — Z806 Family history of leukemia: Secondary | ICD-10-CM | POA: Insufficient documentation

## 2020-07-25 DIAGNOSIS — Z89421 Acquired absence of other right toe(s): Secondary | ICD-10-CM | POA: Diagnosis not present

## 2020-07-25 DIAGNOSIS — Z888 Allergy status to other drugs, medicaments and biological substances status: Secondary | ICD-10-CM | POA: Insufficient documentation

## 2020-07-25 DIAGNOSIS — Z885 Allergy status to narcotic agent status: Secondary | ICD-10-CM | POA: Diagnosis not present

## 2020-07-25 DIAGNOSIS — F17210 Nicotine dependence, cigarettes, uncomplicated: Secondary | ICD-10-CM | POA: Diagnosis not present

## 2020-07-25 DIAGNOSIS — L97826 Non-pressure chronic ulcer of other part of left lower leg with bone involvement without evidence of necrosis: Secondary | ICD-10-CM | POA: Insufficient documentation

## 2020-07-25 DIAGNOSIS — Z801 Family history of malignant neoplasm of trachea, bronchus and lung: Secondary | ICD-10-CM | POA: Diagnosis not present

## 2020-07-25 DIAGNOSIS — E11622 Type 2 diabetes mellitus with other skin ulcer: Secondary | ICD-10-CM | POA: Insufficient documentation

## 2020-07-25 DIAGNOSIS — I110 Hypertensive heart disease with heart failure: Secondary | ICD-10-CM | POA: Diagnosis not present

## 2020-07-25 DIAGNOSIS — E11621 Type 2 diabetes mellitus with foot ulcer: Secondary | ICD-10-CM | POA: Diagnosis not present

## 2020-07-25 DIAGNOSIS — Z833 Family history of diabetes mellitus: Secondary | ICD-10-CM | POA: Insufficient documentation

## 2020-07-25 DIAGNOSIS — J449 Chronic obstructive pulmonary disease, unspecified: Secondary | ICD-10-CM | POA: Insufficient documentation

## 2020-07-25 HISTORY — DX: Post-traumatic stress disorder, unspecified: F43.10

## 2020-07-25 HISTORY — PX: I & D EXTREMITY: SHX5045

## 2020-07-25 HISTORY — DX: Cardiac arrhythmia, unspecified: I49.9

## 2020-07-25 LAB — GLUCOSE, CAPILLARY
Glucose-Capillary: 339 mg/dL — ABNORMAL HIGH (ref 70–99)
Glucose-Capillary: 300 mg/dL — ABNORMAL HIGH (ref 70–99)
Glucose-Capillary: 273 mg/dL — ABNORMAL HIGH (ref 70–99)
Glucose-Capillary: 228 mg/dL — ABNORMAL HIGH (ref 70–99)

## 2020-07-25 LAB — COMPREHENSIVE METABOLIC PANEL
ALT: 25 U/L (ref 0–44)
AST: 29 U/L (ref 15–41)
Albumin: 3.5 g/dL (ref 3.5–5.0)
Alkaline Phosphatase: 89 U/L (ref 38–126)
Anion gap: 13 (ref 5–15)
BUN: 15 mg/dL (ref 6–20)
CO2: 20 mmol/L — ABNORMAL LOW (ref 22–32)
Calcium: 8.9 mg/dL (ref 8.9–10.3)
Chloride: 99 mmol/L (ref 98–111)
Creatinine, Ser: 0.62 mg/dL (ref 0.61–1.24)
GFR, Estimated: >60 mL/min (ref >60)
Glucose, Bld: 340 mg/dL — ABNORMAL HIGH (ref 70–99)
Potassium: 3.8 mmol/L (ref 3.5–5.1)
Sodium: 132 mmol/L — ABNORMAL LOW (ref 135–145)
Total Bilirubin: 0.5 mg/dL (ref 0.3–1.2)
Total Protein: 6.8 g/dL (ref 6.5–8.1)

## 2020-07-25 LAB — CBC
HCT: 46.1 % (ref 39.0–52.0)
Hemoglobin: 15.3 g/dL (ref 13.0–17.0)
MCH: 28.8 pg (ref 26.0–34.0)
MCHC: 33.2 g/dL (ref 30.0–36.0)
MCV: 86.7 fL (ref 80.0–100.0)
Platelets: 243 10*3/uL (ref 150–400)
RBC: 5.32 MIL/uL (ref 4.22–5.81)
RDW: 12.6 % (ref 11.5–15.5)
WBC: 12.5 10*3/uL — ABNORMAL HIGH (ref 4.0–10.5)
nRBC: 0 % (ref 0.0–0.2)

## 2020-07-25 SURGERY — IRRIGATION AND DEBRIDEMENT EXTREMITY
Anesthesia: General | Site: Leg Lower | Laterality: Left

## 2020-07-25 MED ORDER — FENTANYL CITRATE (PF) 250 MCG/5ML IJ SOLN
INTRAMUSCULAR | Status: DC | PRN
  Administered 2020-07-25 (×3): 25 ug via INTRAVENOUS
  Administered 2020-07-25: 50 ug via INTRAVENOUS
  Administered 2020-07-25: 25 ug via INTRAVENOUS

## 2020-07-25 MED ORDER — FENTANYL CITRATE (PF) 100 MCG/2ML IJ SOLN
INTRAMUSCULAR | Status: AC
  Filled 2020-07-25: qty 2

## 2020-07-25 MED ORDER — OXYCODONE-ACETAMINOPHEN 5-325 MG PO TABS: 1.0000 | ORAL_TABLET | ORAL | Status: DC | PRN

## 2020-07-25 MED ORDER — MIDAZOLAM HCL 2 MG/2ML IJ SOLN
INTRAMUSCULAR | Status: AC
  Filled 2020-07-25: qty 2

## 2020-07-25 MED ORDER — KETOROLAC TROMETHAMINE 30 MG/ML IJ SOLN
30.0000 mg | Freq: Once | INTRAMUSCULAR | Status: AC | PRN
  Administered 2020-07-25: 30 mg via INTRAVENOUS

## 2020-07-25 MED ORDER — HYDROMORPHONE HCL 1 MG/ML IJ SOLN: 0.2500 mg | INTRAMUSCULAR | Status: DC | PRN

## 2020-07-25 MED ORDER — LACTATED RINGERS IV SOLN: INTRAVENOUS | Status: DC

## 2020-07-25 MED ORDER — INSULIN ASPART 100 UNIT/ML ~~LOC~~ SOLN
4.0000 [IU] | Freq: Once | SUBCUTANEOUS | Status: AC
  Administered 2020-07-25: 4 [IU] via SUBCUTANEOUS

## 2020-07-25 MED ORDER — CEFAZOLIN SODIUM-DEXTROSE 2-4 GM/100ML-% IV SOLN
2.0000 g | INTRAVENOUS | Status: AC
  Administered 2020-07-25: 2 g via INTRAVENOUS
  Filled 2020-07-25: qty 100

## 2020-07-25 MED ORDER — OXYCODONE HCL 5 MG PO TABS
ORAL_TABLET | ORAL | Status: AC
  Filled 2020-07-25: qty 1

## 2020-07-25 MED ORDER — OXYCODONE-ACETAMINOPHEN 5-325 MG PO TABS
ORAL_TABLET | ORAL | Status: AC
  Administered 2020-07-25: 1
  Filled 2020-07-25: qty 1

## 2020-07-25 MED ORDER — LIDOCAINE 2% (20 MG/ML) 5 ML SYRINGE
INTRAMUSCULAR | Status: DC | PRN
  Administered 2020-07-25: 60 mg via INTRAVENOUS

## 2020-07-25 MED ORDER — OXYCODONE-ACETAMINOPHEN 10-325 MG PO TABS: 1.0000 | ORAL_TABLET | ORAL | 0 refills | Status: DC | PRN

## 2020-07-25 MED ORDER — LIDOCAINE 2% (20 MG/ML) 5 ML SYRINGE
INTRAMUSCULAR | Status: AC
  Filled 2020-07-25: qty 5

## 2020-07-25 MED ORDER — FENTANYL CITRATE (PF) 250 MCG/5ML IJ SOLN
INTRAMUSCULAR | Status: AC
  Filled 2020-07-25: qty 5

## 2020-07-25 MED ORDER — 0.9 % SODIUM CHLORIDE (POUR BTL) OPTIME
TOPICAL | Status: DC | PRN
  Administered 2020-07-25: 1000 mL

## 2020-07-25 MED ORDER — CHLORHEXIDINE GLUCONATE 0.12 % MT SOLN
15.0000 mL | Freq: Once | OROMUCOSAL | Status: AC
  Administered 2020-07-25: 15 mL via OROMUCOSAL
  Filled 2020-07-25: qty 15

## 2020-07-25 MED ORDER — ONDANSETRON HCL 4 MG/2ML IJ SOLN
INTRAMUSCULAR | Status: DC | PRN
  Administered 2020-07-25: 4 mg via INTRAVENOUS

## 2020-07-25 MED ORDER — ORAL CARE MOUTH RINSE: 15.0000 mL | Freq: Once | OROMUCOSAL | Status: AC

## 2020-07-25 MED ORDER — PROPOFOL 10 MG/ML IV BOLUS
INTRAVENOUS | Status: DC | PRN
  Administered 2020-07-25: 150 mg via INTRAVENOUS

## 2020-07-25 MED ORDER — FENTANYL CITRATE (PF) 100 MCG/2ML IJ SOLN
25.0000 ug | INTRAMUSCULAR | Status: DC | PRN
  Administered 2020-07-25 (×3): 50 ug via INTRAVENOUS

## 2020-07-25 MED ORDER — KETOROLAC TROMETHAMINE 30 MG/ML IJ SOLN
INTRAMUSCULAR | Status: AC
  Filled 2020-07-25: qty 1

## 2020-07-25 MED ORDER — OXYCODONE HCL 5 MG PO TABS: 5.0000 mg | ORAL_TABLET | ORAL | Status: DC | PRN

## 2020-07-25 MED ORDER — OXYCODONE HCL 5 MG PO TABS
5.0000 mg | ORAL_TABLET | ORAL | Status: DC | PRN
  Administered 2020-07-25: 5 mg via ORAL

## 2020-07-25 MED ORDER — PROPOFOL 10 MG/ML IV BOLUS
INTRAVENOUS | Status: AC
  Filled 2020-07-25: qty 20

## 2020-07-25 SURGICAL SUPPLY — 29 items
BLADE SURG 21 STRL SS (BLADE) ×2 IMPLANT
BNDG COHESIVE 6X5 TAN NS LF (GAUZE/BANDAGES/DRESSINGS) ×1 IMPLANT
BNDG COHESIVE 6X5 TAN STRL LF (GAUZE/BANDAGES/DRESSINGS) ×1 IMPLANT
COVER SURGICAL LIGHT HANDLE (MISCELLANEOUS) ×3 IMPLANT
DRAPE DERMATAC (DRAPES) ×1 IMPLANT
DRAPE U-SHAPE 47X51 STRL (DRAPES) ×2 IMPLANT
DURAPREP 26ML APPLICATOR (WOUND CARE) ×2 IMPLANT
ELECT REM PT RETURN 9FT ADLT (ELECTROSURGICAL)
ELECTRODE REM PT RTRN 9FT ADLT (ELECTROSURGICAL) IMPLANT
GLOVE BIOGEL PI IND STRL 9 (GLOVE) ×1 IMPLANT
GLOVE BIOGEL PI INDICATOR 9 (GLOVE) ×1
GLOVE SURG ORTHO 9.0 STRL STRW (GLOVE) ×2 IMPLANT
GOWN STRL REUS W/ TWL XL LVL3 (GOWN DISPOSABLE) ×2 IMPLANT
GOWN STRL REUS W/TWL XL LVL3 (GOWN DISPOSABLE) ×4
GRAFT MYRIAD 3 LAYER 10X10 (Graft) ×1 IMPLANT
KIT BASIN OR (CUSTOM PROCEDURE TRAY) ×2 IMPLANT
KIT DRSG PREVENA PLUS 7DAY 125 (MISCELLANEOUS) ×1 IMPLANT
KIT PREVENA INCISION MGT 13 (CANNISTER) ×1 IMPLANT
KIT TURNOVER KIT B (KITS) ×2 IMPLANT
MANIFOLD NEPTUNE II (INSTRUMENTS) ×2 IMPLANT
NS IRRIG 1000ML POUR BTL (IV SOLUTION) ×2 IMPLANT
PACK ORTHO EXTREMITY (CUSTOM PROCEDURE TRAY) ×2 IMPLANT
PAD ARMBOARD 7.5X6 YLW CONV (MISCELLANEOUS) ×4 IMPLANT
POWDER MYRIAD MORCELLS 500MG (Miscellaneous) ×1 IMPLANT
STOCKINETTE IMPERVIOUS 9X36 MD (GAUZE/BANDAGES/DRESSINGS) ×1 IMPLANT
SUT ETHILON 2 0 PSLX (SUTURE) ×3 IMPLANT
TOWEL GREEN STERILE (TOWEL DISPOSABLE) ×2 IMPLANT
TUBE CONNECTING 12X1/4 (SUCTIONS) ×2 IMPLANT
YANKAUER SUCT BULB TIP NO VENT (SUCTIONS) ×2 IMPLANT

## 2020-07-25 NOTE — Interval H&P Note (Signed)
History and Physical Interval Note:  07/25/2020 11:50 AM  Troy [name]  has presented today for surgery, with the diagnosis of Abscess Left Below Knee Amputation.  The various methods of treatment have been discussed with the patient and family. After consideration of risks, benefits and other options for treatment, the patient has consented to  Procedure(s): LEFT BELOW KNEE AMPUTATION ABSCESS EXCISION AND SKIN GRAFT (Left) as a surgical intervention.  The patient's history has been reviewed, patient examined, no change in status, stable for surgery.  I have reviewed the patient's chart and labs.  Questions were answered to the patient's satisfaction.     [name]

## 2020-07-25 NOTE — Anesthesia Preprocedure Evaluation (Addendum)
Anesthesia Evaluation  Patient identified by MRN, date of birth, ID band Patient awake    Reviewed: Allergy & Precautions, NPO status , Patient's Chart, lab work & pertinent test results  History of Anesthesia Complications Negative for: history of anesthetic complications  Airway Mallampati: II  TM Distance: >3 FB Neck ROM: Full    Dental  (+) Caps   Pulmonary COPD, Current Smoker,    Pulmonary exam normal        Cardiovascular hypertension, + Peripheral Vascular Disease and +CHF  Normal cardiovascular exam     Neuro/Psych Anxiety Depression negative neurological ROS     GI/Hepatic Neg liver ROS, GERD  ,  Endo/Other  diabetes, Type 2  Renal/GU negative Renal ROS  negative genitourinary   Musculoskeletal negative musculoskeletal ROS (+)   Abdominal   Peds  Hematology negative hematology ROS (+)   Anesthesia Other Findings   Reproductive/Obstetrics                             Anesthesia Physical Anesthesia Plan  ASA: III  Anesthesia Plan: General   Post-op Pain Management:    Induction: Intravenous  PONV Risk Score and Plan: 1 and Ondansetron, Dexamethasone, Midazolam and Treatment may vary due to age or medical condition  Airway Management Planned: LMA  Additional Equipment: None  Intra-op Plan:   Post-operative Plan: Extubation in OR  Informed Consent: I have reviewed the patients History and Physical, chart, labs and discussed the procedure including the risks, benefits and alternatives for the proposed anesthesia with the patient or authorized representative who has indicated his/her understanding and acceptance.       Plan Discussed with:   Anesthesia Plan Comments:         Anesthesia Quick Evaluation

## 2020-07-25 NOTE — Op Note (Signed)
07/25/2020  2:34 PM  PATIENT:  Troy [name]    PRE-OPERATIVE DIAGNOSIS:  Abscess Left Below Knee Amputation  POST-OPERATIVE DIAGNOSIS:  Same  PROCEDURE:  LEFT BELOW KNEE AMPUTATION ABSCESS EXCISION  ALLOGRAFT SKIN GRAFT 10 x 10 cm with small chips  SURGEON:  [name], MD  PHYSICIAN ASSISTANT:None ANESTHESIA:   General  PREOPERATIVE INDICATIONS:  Troy [name] is a  60 y.o. male with a diagnosis of Abscess Left Below Knee Amputation who failed conservative measures and elected for surgical management.    The risks benefits and alternatives were discussed with the patient preoperatively including but not limited to the risks of infection, bleeding, nerve injury, cardiopulmonary complications, the need for revision surgery, among others, and the patient was willing to proceed.  OPERATIVE IMPLANTS: Allograft skin graft  @ENCIMAGES @  OPERATIVE FINDINGS: The abscess was superficial did not extend down to bone.  Tissue sent for cultures  OPERATIVE PROCEDURE: Patient was brought the operating room and underwent a general anesthetic.  After adequate levels anesthesia were obtained patient's left lower extremity was prepped using DuraPrep draped into a sterile field a timeout was called.  Elliptical incision was made around the ulcerative tissue this left a wound that was 9 x 4 cm.  The abscess did not extend down to bone.  The wound was irrigated with normal saline electrocautery was used hemostasis.  The allograft powder was placed against the tibia and 6 layers of the tissue graft was applied over the tibia.  Local tissue rearrangement was used to close the wound 9 x 4 cm over the tissue graft.  The 13 cm Prevena wound VAC was applied outlined with dermatac this had a good suction fit with the Praveena plus pump.  This was overwrapped with Covan patient was extubated taken the PACU in stable condition.   DISCHARGE PLANNING:  Antibiotic duration: Patient received preoperative antibiotics  and will continue his doxycycline final cultures will determine long-term antibiotics  Weightbearing: Nonweightbearing on the left  Pain medication: Prescription for Percocet  Dressing care/ Wound VAC: Follow-up in 1 week to remove the wound VAC  Ambulatory devices: Wheelchair or walker.  Discharge to: Home.  Follow-up: In the office 1 week post operative.

## 2020-07-25 NOTE — H&P (Signed)
Troy [name] is an 60 y.o. male.   Chief Complaint: Abscess left Below Knee Amputation Stump HPI: Patient is a 60 year old gentleman who presents with 2 separate issues #1 he has developed a pressure ulcer over the tibial tubercle from his prosthesis.  Patient has discontinued use of the prosthesis on the left he has been on doxycycline and presents in follow-up for evaluation of this pressure ulcer.  Patient has also had a chronic ulcer beneath the first metatarsal head of the right foot status post great toe amputation.   Past Medical History:  Diagnosis Date  . Acute renal failure ([place])    in setting of NSAID use and orthopedic surgery 2010  . Anxiety and depression   . Chronic diastolic CHF (congestive heart failure) ([place])    a. Echo 6/17: severe conc LVH, vigorous EF, EF 65-70%, no dynamic obstruction, no RWMA, Gr 1 DD, mild TR  //  b. LHC 8/17: no sig CAD, LVEDP 28  . COPD (chronic obstructive pulmonary disease) ([place])   . Diabetic ulcer of left foot ([place])   . DM2 (diabetes mellitus, type 2) ([place])   . Dysrhythmia   . Family history of early CAD   . Fatty liver   . GERD (gastroesophageal reflux disease)   . History of amputation of foot ([place])    L trans-met // R toe  . History of cardiac catheterization    a. [place] 2002: irregs  //  b. LHC in 8/17: no sig CAD, apical DK, hyperdynamic LV, LVEDP 28  . History of kidney stones   . History of nuclear stress test    a. Nuc 7/17: Overall, intermediate risk nuclear stress test secondary to small size of apical lateral defect and reduced ejection fraction.  EF 43%  . HLD (hyperlipidemia)   . HTN (hypertension)   . Injuries     crushing injury to both his feet in February 2010.   Marland Kitchen Kidney calculi   . Palpitations   . PTSD (post-traumatic stress disorder)   . Tobacco abuse     Past Surgical History:  Procedure Laterality Date  . AMPUTATION Left 01/03/2018   Procedure: LEFT MIDFOOT AMPUTATION/REVISION MIDAMPUTAION;  Surgeon: Mcarthur Rossetti, MD;  Location: [facility];  Service: Orthopedics;  Laterality: Left;  . AMPUTATION Left 01/25/2018   Procedure: LEFT BELOW KNEE AMPUTATION;  Surgeon: [name], MD;  Location: [facility];  Service: Orthopedics;  Laterality: Left;  . AMPUTATION TOE Right 07/17/2019   Procedure: AMPUTATION RIGHT FOOT 2ND TOE;  Surgeon: Mcarthur Rossetti, MD;  Location: [facility];  Service: Orthopedics;  Laterality: Right;  . BELOW KNEE LEG AMPUTATION Left 01/25/2018  . CARDIAC CATHETERIZATION N/A 01/22/2016   Procedure: Left Heart Cath and Coronary Angiography;  Surgeon: Peter M Martinique, MD;  Location: [facility];  Service: Cardiovascular;  Laterality: N/A;  . FOOT AMPUTATION Bilateral   . I & D EXTREMITY Left 12/15/2017   Procedure: IRRIGATION AND DEBRIDEMENT LEFT FOOT ULCER;  Surgeon: Mcarthur Rossetti, MD;  Location[facility];  Service: Orthopedics;  Laterality: Left;  . LITHOTRIPSY    . TENDON LENGTHENING Bilateral    calf  . TONSILLECTOMY      Family History  Problem Relation Age of Onset  . Leukemia Mother 3       died  . Lung cancer Father 58       died  . Heart attack Brother 58  . Heart attack Brother 60  .  Hypertension Brother        X3  . Hypertension Sister        X70  . Diabetes Sister   . Stroke Sister   . Diabetes Sister   . Other Brother        Musician accident   Social History:  reports that he has been smoking cigarettes. He has a 40.00 pack-year smoking history. He has never used smokeless tobacco. He reports that he does not drink alcohol and does not use drugs.  Allergies:  Allergies  Allergen Reactions  . Hydromorphone Hcl Er Anaphylaxis and Other (See Comments)    Allergic to DYE in extended-release tablet, can tolerate other forms of hydromorphone  . Tapentadol Anaphylaxis, Swelling and Other (See Comments)    THROAT ANGIOEDEMA Nucynta [Tapentadol Hydrochloride]  . Exalamide Other (See Comments)    UNSPECIFIED REACTION     No  medications prior to admission.    Results for orders placed or performed during the hospital encounter of 07/24/20 (from the past 48 hour(s))  SARS CORONAVIRUS 2 (TAT 6-24 HRS) Nasopharyngeal Nasopharyngeal Swab     Status: None   Collection Time: 07/24/20 11:25 AM   Specimen: Nasopharyngeal Swab  Result Value Ref Range   SARS Coronavirus 2 NEGATIVE NEGATIVE    Comment: (NOTE) SARS-CoV-2 target nucleic acids are NOT DETECTED.  The SARS-CoV-2 RNA is generally detectable in upper and lower respiratory specimens during the acute phase of infection. Negative results do not preclude SARS-CoV-2 infection, do not rule out co-infections with other pathogens, and should not be used as the sole basis for treatment or other patient management decisions. Negative results must be combined with clinical observations, patient history, and epidemiological information. The expected result is Negative.  Fact Sheet for Patients: SugarRoll.be  Fact Sheet for Healthcare Providers: https://www.woods-mathews.com/  This test is not yet approved or cleared by the [place] FDA and  has been authorized for detection and/or diagnosis of SARS-CoV-2 by FDA under an Emergency Use Authorization (EUA). This EUA will remain  in effect (meaning this test can be used) for the duration of the COVID-19 declaration under Se ction 564(b)(1) of the Act, 21 U.S.C. section 360bbb-3(b)(1), unless the authorization is terminated or revoked sooner.  Performed at [facility], [place] [address], [place], [place] [address]    XR Foot 2 Views Right  Result Date: 07/24/2020 2 view radiographs of the right foot shows cystic destructive changes of the first metatarsal head consistent with chronic osteomyelitis.   Review of Systems  All other systems reviewed and are negative.   Height 5' 7"  (1.702 m), weight 86.2 kg. Physical Exam  Patient is alert, oriented, no  adenopathy, well-dressed, normal affect, normal respiratory effort. Examination patient has a good dorsalis pedis pulse on the right left transtibial amputation he has a ulcer over the medial tibial plateau with necrotic tissue there is an area of cellulitis with induration 5 cm in diameter there is a necrotic ulcer that is 2 cm in diameter 5 mm deep that extends down to bone.Heart RRR Lungs Clear Assessment/Plan 1. Pain in right foot   2. Below-knee amputation of left lower extremity ([place])   3. Skin ulcer of left lower leg with necrosis of bone ([place])   4. Ulcer of right foot with necrosis of bone ([place])     Plan: With the proximal tibial ulcer over the medial tibial plateau which has developed increased necrotic tissue increase cellulitis we will plan for surgical intervention for  debridement placement of skin graft and wound VAC therapy will obtain deep tissue cultures and adjust antibiotics accordingly.  With the chronic ulcer beneath the first metatarsal head of the right foot.  Patient will need to undergo surgical intervention for excision of the infected bone and we will proceed with this once he can ambulate on the prosthesis on the left.   Bevely Palmer Michille Mcelrath, PA 07/25/2020, 6:45 AM

## 2020-07-25 NOTE — Progress Notes (Signed)
After receiving 4 units of novolog for blood sugar of 339, recheck 300. Notified Dr. Christella Hartigan, no new orders, continue to monitor

## 2020-07-25 NOTE — Anesthesia Postprocedure Evaluation (Signed)
Anesthesia Post Note  Patient: Troy [name]  Procedure(s) Performed: LEFT BELOW KNEE AMPUTATION ABSCESS EXCISION AND SKIN GRAFT (Left Leg Lower)     Patient location during evaluation: PACU Anesthesia Type: General Level of consciousness: awake and alert Pain management: pain level controlled Vital Signs Assessment: post-procedure vital signs reviewed and stable Respiratory status: spontaneous breathing, nonlabored ventilation and respiratory function stable Cardiovascular status: blood pressure returned to baseline and stable Postop Assessment: no apparent nausea or vomiting Anesthetic complications: no   No complications documented.  Last Vitals:  Vitals:   07/25/20 1520 07/25/20 1535  BP: 128/85 132/85  Pulse: (!) 102 97  Resp: 17 20  Temp:  36.4 C  SpO2: 92% 98%    Last Pain:  Vitals:   07/25/20 1505  TempSrc:   PainSc: 8                  Lidia Collum

## 2020-07-25 NOTE — Transfer of Care (Signed)
Immediate Anesthesia Transfer of Care Note  Patient: Troy [name]  Procedure(s) Performed: LEFT BELOW KNEE AMPUTATION ABSCESS EXCISION AND SKIN GRAFT (Left Leg Lower)  Patient Location: PACU  Anesthesia Type:General  Level of Consciousness: awake, alert  and oriented  Airway & Oxygen Therapy: Patient Spontanous Breathing and Patient connected to face mask oxygen  Post-op Assessment: Report given to RN, Post -op Vital signs reviewed and stable and Patient moving all extremities  Post vital signs: Reviewed and stable  Last Vitals:  Vitals Value Taken Time  BP 153/90 07/25/20 1435  Temp 36.4 C 07/25/20 1435  Pulse 105 07/25/20 1443  Resp 15 07/25/20 1443  SpO2 98 % 07/25/20 1443  Vitals shown include unvalidated device data.  Last Pain:  Vitals:   07/25/20 1435  TempSrc:   PainSc: 8       Patients Stated Pain Goal: 3 (93/71/69 6789)  Complications: No complications documented.

## 2020-07-25 NOTE — Anesthesia Procedure Notes (Signed)
Procedure Name: LMA Insertion Date/Time: 07/25/2020 2:05 PM Performed by: Myna Bright, CRNA Pre-anesthesia Checklist: Emergency Drugs available, Patient identified, Suction available and Patient being monitored Patient Re-evaluated:Patient Re-evaluated prior to induction Oxygen Delivery Method: Circle system utilized Preoxygenation: Pre-oxygenation with 100% oxygen Induction Type: IV induction Ventilation: Mask ventilation without difficulty LMA: LMA inserted LMA Size: 5.0 Number of attempts: 1 Placement Confirmation: positive ETCO2 and breath sounds checked- equal and bilateral Tube secured with: Tape Dental Injury: Teeth and Oropharynx as per pre-operative assessment

## 2020-07-28 ENCOUNTER — Encounter (HOSPITAL_COMMUNITY): Payer: Self-pay | Admitting: Orthopedic Surgery

## 2020-07-28 ENCOUNTER — Telehealth: Payer: Self-pay

## 2020-07-28 NOTE — Telephone Encounter (Signed)
[name] Marcie Bal had left a message  concerning wound vac.  Talked with patient's wife Marcie Bal and she stated that wound vac is working now.  Advised her to call back with any questions or concerns.

## 2020-07-31 LAB — AEROBIC/ANAEROBIC CULTURE W GRAM STAIN (SURGICAL/DEEP WOUND)

## 2020-08-01 ENCOUNTER — Ambulatory Visit (INDEPENDENT_AMBULATORY_CARE_PROVIDER_SITE_OTHER): Payer: Medicare HMO | Admitting: Physician Assistant

## 2020-08-01 ENCOUNTER — Encounter: Payer: Self-pay | Admitting: Physician Assistant

## 2020-08-01 DIAGNOSIS — M869 Osteomyelitis, unspecified: Secondary | ICD-10-CM

## 2020-08-01 MED ORDER — SILVER SULFADIAZINE 1 % EX CREA: 1.0000 "application " | TOPICAL_CREAM | Freq: Every day | CUTANEOUS | 0 refills | Status: DC

## 2020-08-01 NOTE — Progress Notes (Signed)
Office Visit Note   Patient: Troy [name]           Date of Birth: [birth year]           MRN: [id number] Visit Date: 08/01/2020              Requested by: [name], MD [address] [place],  [place] [id number] PCP: [name], MD  No chief complaint on file.     HPI: Patient presents today he is 1 week status post irrigation and debridement with application of graft left below-knee amputation abscess.  He is doing very well.  He is accompanied by his wife.  He also has been seen and followed for a deep ulcer beneath the plantar surface of the right forefoot is on doxycycline.  Assessment & Plan: Visit Diagnoses: No diagnosis found.  Plan: Rep was here today and suggested Silvadene over the area of the graft.  She will wife will do daily dressing changes follow-up in 1 week.  We need to continue to follow the right foot.  At some point when able we will go forward with surgery on the right foot.  Follow-Up Instructions: No follow-ups on file.   Ortho Exam  Patient is alert, oriented, no adenopathy, well-dressed, normal affect, normal respiratory effort. Right foot is stable no cellulitis deep ulcer with some Silvadene and clean dressing.  Left below-knee amputation stump has a incision across the front of it.  Well apposed wound edges no swelling no cellulitis no foul odor.  Graft is stable and in the central portion of this wound.  Imaging: No results found. No images are attached to the encounter.  Labs: Lab Results  Component Value Date   HGBA1C 11.0 (H) 12/13/2017   REPTSTATUS 07/31/2020 FINAL 07/25/2020   GRAMSTAIN  07/25/2020    FEW WBC PRESENT, PREDOMINANTLY PMN FEW GRAM POSITIVE COCCI    CULT  07/25/2020    FEW STREPTOCOCCUS AGALACTIAE TESTING AGAINST S. AGALACTIAE NOT ROUTINELY PERFORMED DUE TO PREDICTABILITY OF AMP/PEN/VAN SUSCEPTIBILITY. RARE CANDIDA GLABRATA NO ANAEROBES ISOLATED Performed at [facility], [place] [address],  [place], [place] [address]      Lab Results  Component Value Date   ALBUMIN 3.5 07/25/2020   ALBUMIN 4.0 10/26/2018   ALBUMIN 4.2 10/22/2018    Lab Results  Component Value Date   MG 1.4 (L) 10/22/2018   MG 1.9 08/14/2008   MG 2.2 08/11/2008   No results found for: VD25OH  No results found for: PREALBUMIN CBC EXTENDED Latest Ref Rng & Units 07/25/2020 07/13/2019 10/26/2018  WBC 4.0 - 10.5 K/uL 12.5(H) 15.5(H) 10.6(H)  RBC 4.22 - 5.81 MIL/uL 5.32 5.58 5.84(H)  HGB 13.0 - 17.0 g/dL 15.3 16.1 16.3  HCT 39.0 - 52.0 % 46.1 47.1 50.2  PLT 150 - 400 K/uL 243 213 220  NEUTROABS 1.7 - 7.7 K/uL - - 7.0  LYMPHSABS 0.7 - 4.0 K/uL - - 2.7     There is no height or weight on file to calculate BMI.  Orders:  No orders of the defined types were placed in this encounter.  Meds ordered this encounter  Medications  . silver sulfADIAZINE (SILVADENE) 1 % cream    Sig: Apply 1 application topically daily.    Dispense:  50 g    Refill:  0     Procedures: No procedures performed  Clinical Data: No additional findings.  ROS:  All other systems negative, except as noted in the HPI. Review of Systems  Objective: Vital Signs: There were no vitals taken for this visit.  Specialty Comments:  No specialty comments available.  PMFS History: Patient Active Problem List   Diagnosis Date Noted  . Abscess of left lower leg   . Osteomyelitis of second toe of right foot ([place]) 07/12/2019  . Below knee amputation status, left 01/25/2018  . Wound dehiscence, surgical   . Foot amputation status, left 01/03/2018  . Status post left foot surgery 12/15/2017  . GERD (gastroesophageal reflux disease) 09/26/2017  . Hyperlipidemia associated with type 2 diabetes mellitus ([place]) 07/08/2017  . Snoring 06/07/2016  . Chest pain 01/22/2016  . Abnormal nuclear stress test 01/22/2016  . Pain, chronic due to trauma 07/04/2012  . Complex regional pain syndrome I of lower limb 07/04/2012  . COPD (chronic  obstructive pulmonary disease) ([place]) 01/27/2011  . Pre-operative cardiovascular examination 01/27/2011  . Nonspecific abnormal electrocardiogram (ECG) (EKG) 01/27/2011  . Murmur 01/27/2011  . DM2 (diabetes mellitus, type 2) ([place])   . HTN (hypertension)   . HLD (hyperlipidemia)   . Tobacco abuse    Past Medical History:  Diagnosis Date  . Acute renal failure ([place])    in setting of NSAID use and orthopedic surgery 2010  . Anxiety and depression   . Chronic diastolic CHF (congestive heart failure) ([place])    a. Echo 6/17: severe conc LVH, vigorous EF, EF 65-70%, no dynamic obstruction, no RWMA, Gr 1 DD, mild TR  //  b. LHC 8/17: no sig CAD, LVEDP 28  . COPD (chronic obstructive pulmonary disease) ([place])   . Diabetic ulcer of left foot ([place])   . DM2 (diabetes mellitus, type 2) ([place])   . Dysrhythmia   . Family history of early CAD   . Fatty liver   . GERD (gastroesophageal reflux disease)   . History of amputation of foot ([place])    L trans-met // R toe  . History of cardiac catheterization    a. [place] 2002: irregs  //  b. LHC in 8/17: no sig CAD, apical DK, hyperdynamic LV, LVEDP 28  . History of kidney stones   . History of nuclear stress test    a. Nuc 7/17: Overall, intermediate risk nuclear stress test secondary to small size of apical lateral defect and reduced ejection fraction.  EF 43%  . HLD (hyperlipidemia)   . HTN (hypertension)   . Injuries     crushing injury to both his feet in February 2010.   Marland Kitchen Kidney calculi   . Palpitations   . PTSD (post-traumatic stress disorder)   . Tobacco abuse     Family History  Problem Relation Age of Onset  . Leukemia Mother 45       died  . Lung cancer Father 42       died  . Heart attack Brother 66  . Heart attack Brother 23  . Hypertension Brother        X3  . Hypertension Sister        X51  . Diabetes Sister   . Stroke Sister   . Diabetes Sister   . Other Brother        Musician accident    Past Surgical History:  Procedure  Laterality Date  . AMPUTATION Left 01/03/2018   Procedure: LEFT MIDFOOT AMPUTATION/REVISION MIDAMPUTAION;  Surgeon: Mcarthur Rossetti, MD;  Location: [facility];  Service: Orthopedics;  Laterality: Left;  . AMPUTATION Left 01/25/2018   Procedure: LEFT BELOW KNEE AMPUTATION;  Surgeon: [name],  MD;  Location: [facility];  Service: Orthopedics;  Laterality: Left;  . AMPUTATION TOE Right 07/17/2019   Procedure: AMPUTATION RIGHT FOOT 2ND TOE;  Surgeon: Mcarthur Rossetti, MD;  Location: [facility];  Service: Orthopedics;  Laterality: Right;  . BELOW KNEE LEG AMPUTATION Left 01/25/2018  . CARDIAC CATHETERIZATION N/A 01/22/2016   Procedure: Left Heart Cath and Coronary Angiography;  Surgeon: Peter M Martinique, MD;  Location: [facility];  Service: Cardiovascular;  Laterality: N/A;  . FOOT AMPUTATION Bilateral   . I & D EXTREMITY Left 12/15/2017   Procedure: IRRIGATION AND DEBRIDEMENT LEFT FOOT ULCER;  Surgeon: Mcarthur Rossetti, MD;  [facility];  Service: Orthopedics;  Laterality: Left;  . I & D EXTREMITY Left 07/25/2020   Procedure: LEFT BELOW KNEE AMPUTATION ABSCESS EXCISION AND SKIN GRAFT;  Surgeon: [name], MD;  Location: [facility];  Service: Orthopedics;  Laterality: Left;  . LITHOTRIPSY    . TENDON LENGTHENING Bilateral    calf  . TONSILLECTOMY     Social History   Occupational History  . Occupation: DISABLED  Tobacco Use  . Smoking status: Current Every Day Smoker    Packs/day: 1.00    Years: 40.00    Pack years: 40.00    Types: Cigarettes  . Smokeless tobacco: Never Used  Vaping Use  . Vaping Use: Never used  Substance and Sexual Activity  . Alcohol use: No  . Drug use: No  . Sexual activity: Not on file

## 2020-08-04 ENCOUNTER — Other Ambulatory Visit: Payer: Self-pay

## 2020-08-04 ENCOUNTER — Telehealth: Payer: Self-pay | Admitting: Physician Assistant

## 2020-08-04 NOTE — Telephone Encounter (Signed)
[name] Troy [name] called requesting a prescription of nupirocin ointment 2% for patient. Wife states she has been using it on him and it is working. Please send to [facility] [place]. Phone number to patient is [phone].

## 2020-08-05 ENCOUNTER — Other Ambulatory Visit: Payer: Self-pay | Admitting: Physician Assistant

## 2020-08-05 ENCOUNTER — Encounter: Payer: Self-pay | Admitting: Gastroenterology

## 2020-08-05 MED ORDER — MUPIROCIN 2 % EX OINT: 1.0000 "application " | TOPICAL_OINTMENT | Freq: Two times a day (BID) | CUTANEOUS | 3 refills | Status: DC

## 2020-08-05 NOTE — Telephone Encounter (Signed)
Pt is asking for an rx for Bactroban ointment. I did not see it on his medication list so could not refill. Could you send in rx for pt?

## 2020-08-05 NOTE — Telephone Encounter (Signed)
Done

## 2020-08-08 ENCOUNTER — Encounter: Payer: Self-pay | Admitting: Physician Assistant

## 2020-08-08 ENCOUNTER — Ambulatory Visit (INDEPENDENT_AMBULATORY_CARE_PROVIDER_SITE_OTHER): Payer: Medicare HMO | Admitting: Physician Assistant

## 2020-08-08 DIAGNOSIS — M869 Osteomyelitis, unspecified: Secondary | ICD-10-CM

## 2020-08-08 NOTE — Progress Notes (Signed)
Office Visit Note   Patient: Troy [name]           Date of Birth: Apr 28, 1961           MRN: [id number] Visit Date: 08/08/2020              Requested by: [name], MD [address] [place],  [place] [address] PCP: [name], MD  No chief complaint on file.     HPI: Patient presents today he is 2 weeks status post debridement and application of skin graft status post left below-knee amputation.  He has not been wearing his shrinker because he finds it "irritating.  Assessment & Plan: Visit Diagnoses: No diagnosis found.  Plan: Explained the importance of the Shing shrinker against the skin with wound healing.  We will follow-up in 1 week.  They wish to see Dr. Sharol Given as they have not seen him since surgery  Follow-Up Instructions: No follow-ups on file.   Ortho Exam  Patient is alert, oriented, no adenopathy, well-dressed, normal affect, normal respiratory effort. He does have surgical sutures in place.  Fibrinous tissue no surrounding cellulitis cannot appreciate a significant amount of vascular bleeding tissue.  Mild to moderate soft tissue swelling but no ascending cellulitis  Imaging: No results found. No images are attached to the encounter.  Labs: Lab Results  Component Value Date   HGBA1C 11.0 (H) 12/13/2017   REPTSTATUS 07/31/2020 FINAL 07/25/2020   GRAMSTAIN  07/25/2020    FEW WBC PRESENT, PREDOMINANTLY PMN FEW GRAM POSITIVE COCCI    CULT  07/25/2020    FEW STREPTOCOCCUS AGALACTIAE TESTING AGAINST S. AGALACTIAE NOT ROUTINELY PERFORMED DUE TO PREDICTABILITY OF AMP/PEN/VAN SUSCEPTIBILITY. RARE CANDIDA GLABRATA NO ANAEROBES ISOLATED Performed at [facility], [place] [address] [place], [place] [address]      Lab Results  Component Value Date   ALBUMIN 3.5 07/25/2020   ALBUMIN 4.0 10/26/2018   ALBUMIN 4.2 10/22/2018    Lab Results  Component Value Date   MG 1.4 (L) 10/22/2018   MG 1.9 08/14/2008   MG 2.2 08/11/2008   No results  found for: VD25OH  No results found for: PREALBUMIN CBC EXTENDED Latest Ref Rng & Units 07/25/2020 07/13/2019 10/26/2018  WBC 4.0 - 10.5 K/uL 12.5(H) 15.5(H) 10.6(H)  RBC 4.22 - 5.81 MIL/uL 5.32 5.58 5.84(H)  HGB 13.0 - 17.0 g/dL 15.3 16.1 16.3  HCT 39.0 - 52.0 % 46.1 47.1 50.2  PLT 150 - 400 K/uL 243 213 220  NEUTROABS 1.7 - 7.7 K/uL - - 7.0  LYMPHSABS 0.7 - 4.0 K/uL - - 2.7     There is no height or weight on file to calculate BMI.  Orders:  No orders of the defined types were placed in this encounter.  No orders of the defined types were placed in this encounter.    Procedures: No procedures performed  Clinical Data: No additional findings.  ROS:  All other systems negative, except as noted in the HPI. Review of Systems  Objective: Vital Signs: There were no vitals taken for this visit.  Specialty Comments:  No specialty comments available.  PMFS History: Patient Active Problem List   Diagnosis Date Noted  . Abscess of left lower leg   . Osteomyelitis of second toe of right foot ([place]) 07/12/2019  . Below knee amputation status, left 01/25/2018  . Wound dehiscence, surgical   . Foot amputation status, left 01/03/2018  . Status post left foot surgery 12/15/2017  . GERD (gastroesophageal  reflux disease) 09/26/2017  . Hyperlipidemia associated with type 2 diabetes mellitus ([place]) 07/08/2017  . Snoring 06/07/2016  . Chest pain 01/22/2016  . Abnormal nuclear stress test 01/22/2016  . Pain, chronic due to trauma 07/04/2012  . Complex regional pain syndrome I of lower limb 07/04/2012  . COPD (chronic obstructive pulmonary disease) ([place]) 01/27/2011  . Pre-operative cardiovascular examination 01/27/2011  . Nonspecific abnormal electrocardiogram (ECG) (EKG) 01/27/2011  . Murmur 01/27/2011  . DM2 (diabetes mellitus, type 2) ([place])   . HTN (hypertension)   . HLD (hyperlipidemia)   . Tobacco abuse    Past Medical History:  Diagnosis Date  . Acute renal failure ([place])     in setting of NSAID use and orthopedic surgery 2010  . Anxiety and depression   . Chronic diastolic CHF (congestive heart failure) ([place])    a. Echo 6/17: severe conc LVH, vigorous EF, EF 65-70%, no dynamic obstruction, no RWMA, Gr 1 DD, mild TR  //  b. LHC 8/17: no sig CAD, LVEDP 28  . COPD (chronic obstructive pulmonary disease) ([place])   . Diabetic ulcer of left foot ([place])   . DM2 (diabetes mellitus, type 2) ([place])   . Dysrhythmia   . Family history of early CAD   . Fatty liver   . GERD (gastroesophageal reflux disease)   . History of amputation of foot ([place])    L trans-met // R toe  . History of cardiac catheterization    a. [place] 2002: irregs  //  b. LHC in 8/17: no sig CAD, apical DK, hyperdynamic LV, LVEDP 28  . History of kidney stones   . History of nuclear stress test    a. Nuc 7/17: Overall, intermediate risk nuclear stress test secondary to small size of apical lateral defect and reduced ejection fraction.  EF 43%  . HLD (hyperlipidemia)   . HTN (hypertension)   . Injuries     crushing injury to both his feet in February 2010.   Marland Kitchen Kidney calculi   . Palpitations   . PTSD (post-traumatic stress disorder)   . Tobacco abuse     Family History  Problem Relation Age of Onset  . Leukemia Mother 45       died  . Lung cancer Father 45       died  . Heart attack Brother 7  . Heart attack Brother 57  . Hypertension Brother        X3  . Hypertension Sister        X53  . Diabetes Sister   . Stroke Sister   . Diabetes Sister   . Other Brother        Musician accident    Past Surgical History:  Procedure Laterality Date  . AMPUTATION Left 01/03/2018   Procedure: LEFT MIDFOOT AMPUTATION/REVISION MIDAMPUTAION;  Surgeon: Mcarthur Rossetti, MD;  Location: [facility];  Service: Orthopedics;  Laterality: Left;  . AMPUTATION Left 01/25/2018   Procedure: LEFT BELOW KNEE AMPUTATION;  Surgeon: [name], MD;  Location: [facility];  Service: Orthopedics;  Laterality: Left;  . AMPUTATION  TOE Right 07/17/2019   Procedure: AMPUTATION RIGHT FOOT 2ND TOE;  Surgeon: Mcarthur Rossetti, MD;  Location: [facility];  Service: Orthopedics;  Laterality: Right;  . BELOW KNEE LEG AMPUTATION Left 01/25/2018  . CARDIAC CATHETERIZATION N/A 01/22/2016   Procedure: Left Heart Cath and Coronary Angiography;  Surgeon: Peter M Martinique, MD;  Location: [facility];  Service: Cardiovascular;  Laterality:  N/A;  . FOOT AMPUTATION Bilateral   . I & D EXTREMITY Left 12/15/2017   Procedure: IRRIGATION AND DEBRIDEMENT LEFT FOOT ULCER;  Surgeon: Mcarthur Rossetti, MD;  [facility];  Service: Orthopedics;  Laterality: Left;  . I & D EXTREMITY Left 07/25/2020   Procedure: LEFT BELOW KNEE AMPUTATION ABSCESS EXCISION AND SKIN GRAFT;  Surgeon: [name], MD;  Location: [facility];  Service: Orthopedics;  Laterality: Left;  . LITHOTRIPSY    . TENDON LENGTHENING Bilateral    calf  . TONSILLECTOMY     Social History   Occupational History  . Occupation: DISABLED  Tobacco Use  . Smoking status: Current Every Day Smoker    Packs/day: 1.00    Years: 40.00    Pack years: 40.00    Types: Cigarettes  . Smokeless tobacco: Never Used  Vaping Use  . Vaping Use: Never used  Substance and Sexual Activity  . Alcohol use: No  . Drug use: No  . Sexual activity: Not on file

## 2020-08-14 ENCOUNTER — Ambulatory Visit (INDEPENDENT_AMBULATORY_CARE_PROVIDER_SITE_OTHER): Payer: Medicare HMO | Admitting: Orthopedic Surgery

## 2020-08-14 DIAGNOSIS — Z89512 Acquired absence of left leg below knee: Secondary | ICD-10-CM

## 2020-08-14 DIAGNOSIS — L97924 Non-pressure chronic ulcer of unspecified part of left lower leg with necrosis of bone: Secondary | ICD-10-CM

## 2020-08-14 DIAGNOSIS — S88112A Complete traumatic amputation at level between knee and ankle, left lower leg, initial encounter: Secondary | ICD-10-CM

## 2020-08-18 ENCOUNTER — Encounter: Payer: Self-pay | Admitting: Orthopedic Surgery

## 2020-08-18 NOTE — Progress Notes (Signed)
Office Visit Note   Patient: Troy [name]           Date of Birth: 05-04-1961           MRN: [id number] Visit Date: 08/14/2020              Requested by: [name], MD [name] [place],  [place] [phone] PCP: [name], MD  Chief Complaint  Patient presents with  . Left Leg - Follow-up     . HPI: Patient is a 60 year old gentleman who presents for 2 separate issues.  #1 follow-up status post skin graft left below the knee amputation abscess and ulcer.  #2 ulcer right great toe Wagner grade 1.  Assessment & Plan: Visit Diagnoses:  1. Below-knee amputation of left lower extremity ([place])   2. 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  Follow-Up Instructions: Return in about 1 week (around 08/21/2020).   Ortho Exam  Patient is alert, oriented, no adenopathy, well-dressed, normal affect, normal respiratory effort. Patient has 2 issues #1 ischemic changes to the skin graft left tibial ulcer.  Recommend continuing with the Silvadene.  Patient's right great toe amputation has a Wagner grade 1 ulcer that is 5 mm in diameter after informed consent a 10 blade knife was used to debride the skin and soft tissue back to healthy viable granulation tissue after debridement the ulcer is 3 cm in diameter and 3 mm deep.  Imaging: No results found. No images are attached to the encounter.  Labs: Lab Results  Component Value Date   HGBA1C 11.0 (H) 12/13/2017   REPTSTATUS 07/31/2020 FINAL 07/25/2020   GRAMSTAIN  07/25/2020    FEW WBC PRESENT, PREDOMINANTLY PMN FEW GRAM POSITIVE COCCI    CULT  07/25/2020    FEW STREPTOCOCCUS AGALACTIAE TESTING AGAINST S. AGALACTIAE NOT ROUTINELY PERFORMED DUE TO PREDICTABILITY OF AMP/PEN/VAN SUSCEPTIBILITY. RARE CANDIDA GLABRATA NO ANAEROBES ISOLATED Performed at [facility], [place] [address], [place], [place] [address]      Lab  Results  Component Value Date   ALBUMIN 3.5 07/25/2020   ALBUMIN 4.0 10/26/2018   ALBUMIN 4.2 10/22/2018    Lab Results  Component Value Date   MG 1.4 (L) 10/22/2018   MG 1.9 08/14/2008   MG 2.2 08/11/2008   No results found for: VD25OH  No results found for: PREALBUMIN CBC EXTENDED Latest Ref Rng & Units 07/25/2020 07/13/2019 10/26/2018  WBC 4.0 - 10.5 K/uL 12.5(H) 15.5(H) 10.6(H)  RBC 4.22 - 5.81 MIL/uL 5.32 5.58 5.84(H)  HGB 13.0 - 17.0 g/dL 15.3 16.1 16.3  HCT 39.0 - 52.0 % 46.1 47.1 50.2  PLT 150 - 400 K/uL 243 213 220  NEUTROABS 1.7 - 7.7 K/uL - - 7.0  LYMPHSABS 0.7 - 4.0 K/uL - - 2.7     There is no height or weight on file to calculate BMI.  Orders:  No orders of the defined types were placed in this encounter.  No orders of the defined types were placed in this encounter.    Procedures: No procedures performed  Clinical Data: No additional findings.  ROS:  All other systems negative, except as noted in the HPI. Review of Systems  Objective: Vital Signs: There were no vitals taken for this visit.  Specialty Comments:  No specialty comments available.  PMFS History: Patient Active Problem List   Diagnosis Date  Noted  . Abscess of left lower leg   . Osteomyelitis of second toe of right foot ([place]) 07/12/2019  . Below knee amputation status, left 01/25/2018  . Wound dehiscence, surgical   . Foot amputation status, left 01/03/2018  . Status post left foot surgery 12/15/2017  . GERD (gastroesophageal reflux disease) 09/26/2017  . Hyperlipidemia associated with type 2 diabetes mellitus ([place]) 07/08/2017  . Snoring 06/07/2016  . Chest pain 01/22/2016  . Abnormal nuclear stress test 01/22/2016  . Pain, chronic due to trauma 07/04/2012  . Complex regional pain syndrome I of lower limb 07/04/2012  . COPD (chronic obstructive pulmonary disease) ([place]) 01/27/2011  . Pre-operative cardiovascular examination 01/27/2011  . Nonspecific abnormal electrocardiogram  (ECG) (EKG) 01/27/2011  . Murmur 01/27/2011  . DM2 (diabetes mellitus, type 2) ([place])   . HTN (hypertension)   . HLD (hyperlipidemia)   . Tobacco abuse    Past Medical History:  Diagnosis Date  . Acute renal failure ([place])    in setting of NSAID use and orthopedic surgery 2010  . Anxiety and depression   . Chronic diastolic CHF (congestive heart failure) ([place])    a. Echo 6/17: severe conc LVH, vigorous EF, EF 65-70%, no dynamic obstruction, no RWMA, Gr 1 DD, mild TR  //  b. LHC 8/17: no sig CAD, LVEDP 28  . COPD (chronic obstructive pulmonary disease) ([place])   . Diabetic ulcer of left foot ([place])   . DM2 (diabetes mellitus, type 2) ([place])   . Dysrhythmia   . Family history of early CAD   . Fatty liver   . GERD (gastroesophageal reflux disease)   . History of amputation of foot ([place])    L trans-met // R toe  . History of cardiac catheterization    a. [place] 2002: irregs  //  b. LHC in 8/17: no sig CAD, apical DK, hyperdynamic LV, LVEDP 28  . History of kidney stones   . History of nuclear stress test    a. Nuc 7/17: Overall, intermediate risk nuclear stress test secondary to small size of apical lateral defect and reduced ejection fraction.  EF 43%  . HLD (hyperlipidemia)   . HTN (hypertension)   . Injuries     crushing injury to both his feet in February 2010.   Marland Kitchen Kidney calculi   . Palpitations   . PTSD (post-traumatic stress disorder)   . Tobacco abuse     Family History  Problem Relation Age of Onset  . Leukemia Mother 24       died  . Lung cancer Father 98       died  . Heart attack Brother 80  . Heart attack Brother 84  . Hypertension Brother        X3  . Hypertension Sister        X110  . Diabetes Sister   . Stroke Sister   . Diabetes Sister   . Other Brother        Musician accident    Past Surgical History:  Procedure Laterality Date  . AMPUTATION Left 01/03/2018   Procedure: LEFT MIDFOOT AMPUTATION/REVISION MIDAMPUTAION;  Surgeon: Mcarthur Rossetti, MD;   Location: [facility];  Service: Orthopedics;  Laterality: Left;  . AMPUTATION Left 01/25/2018   Procedure: LEFT BELOW KNEE AMPUTATION;  Surgeon: [name], MD;  Location: [facility];  Service: Orthopedics;  Laterality: Left;  . AMPUTATION TOE Right 07/17/2019   Procedure: AMPUTATION RIGHT FOOT 2ND TOE;  Surgeon: Mcarthur Rossetti,  MD;  Location: [facility];  Service: Orthopedics;  Laterality: Right;  . BELOW KNEE LEG AMPUTATION Left 01/25/2018  . CARDIAC CATHETERIZATION N/A 01/22/2016   Procedure: Left Heart Cath and Coronary Angiography;  Surgeon: Peter M Martinique, MD;  Location: [facility];  Service: Cardiovascular;  Laterality: N/A;  . FOOT AMPUTATION Bilateral   . I & D EXTREMITY Left 12/15/2017   Procedure: IRRIGATION AND DEBRIDEMENT LEFT FOOT ULCER;  Surgeon: Mcarthur Rossetti, MD;  [facility];  Service: Orthopedics;  Laterality: Left;  . I & D EXTREMITY Left 07/25/2020   Procedure: LEFT BELOW KNEE AMPUTATION ABSCESS EXCISION AND SKIN GRAFT;  Surgeon: [name], MD;  Location: [facility];  Service: Orthopedics;  Laterality: Left;  . LITHOTRIPSY    . TENDON LENGTHENING Bilateral    calf  . TONSILLECTOMY     Social History   Occupational History  . Occupation: DISABLED  Tobacco Use  . Smoking status: Current Every Day Smoker    Packs/day: 1.00    Years: 40.00    Pack years: 40.00    Types: Cigarettes  . Smokeless tobacco: Never Used  Vaping Use  . Vaping Use: Never used  Substance and Sexual Activity  . Alcohol use: No  . Drug use: No  . Sexual activity: Not on file

## 2020-08-21 ENCOUNTER — Other Ambulatory Visit: Payer: Self-pay | Admitting: Physician Assistant

## 2020-08-21 ENCOUNTER — Other Ambulatory Visit: Payer: Self-pay

## 2020-08-21 ENCOUNTER — Other Ambulatory Visit (HOSPITAL_COMMUNITY)
Admission: RE | Admit: 2020-08-21 | Discharge: 2020-08-21 | Disposition: A | Discharge status: Home / Self Care | Payer: Medicare HMO | Source: Ambulatory Visit | Attending: Orthopedic Surgery | Admitting: Orthopedic Surgery

## 2020-08-21 ENCOUNTER — Ambulatory Visit (INDEPENDENT_AMBULATORY_CARE_PROVIDER_SITE_OTHER): Payer: Medicare HMO | Admitting: Orthopedic Surgery

## 2020-08-21 ENCOUNTER — Encounter: Payer: Self-pay | Admitting: Orthopedic Surgery

## 2020-08-21 DIAGNOSIS — L97924 Non-pressure chronic ulcer of unspecified part of left lower leg with necrosis of bone: Secondary | ICD-10-CM

## 2020-08-21 DIAGNOSIS — Z20822 Contact with and (suspected) exposure to covid-19: Secondary | ICD-10-CM | POA: Diagnosis not present

## 2020-08-21 DIAGNOSIS — Z89512 Acquired absence of left leg below knee: Secondary | ICD-10-CM

## 2020-08-21 DIAGNOSIS — Z01812 Encounter for preprocedural laboratory examination: Secondary | ICD-10-CM | POA: Insufficient documentation

## 2020-08-21 DIAGNOSIS — S88112A Complete traumatic amputation at level between knee and ankle, left lower leg, initial encounter: Secondary | ICD-10-CM

## 2020-08-21 LAB — SARS CORONAVIRUS 2 (TAT 6-24 HRS): SARS Coronavirus 2: NEGATIVE

## 2020-08-21 MED ORDER — OXYCODONE-ACETAMINOPHEN 10-325 MG PO TABS: 1.0000 | ORAL_TABLET | ORAL | 0 refills | Status: DC | PRN

## 2020-08-21 NOTE — Progress Notes (Signed)
Troy Troy [name] has his wife answer questions Troy [name].  Troy [name] states patient does not complain of shortness of breath or chest pain.  Patient was tested for Covid today and is in  quarantine with family.  Troy [name] has type II diabetes.Troy [name] does not know CBG numbers- last A1c was 13.9 - 05/08/2020 in a [place] office. Troy [name] states that Troy. [name] takes [name] in the afternoon, so he took the full dose. I instructed patient to check CBG after awaking and every 2 hours until arrival  to the hospital.  I Instructed patient if CBG is less than 70 to drink 1/2 cup of a clear juice. Recheck CBG in 15 minutes.

## 2020-08-21 NOTE — Progress Notes (Signed)
Office Visit Note   Patient: Troy [name]           Date of Birth: [birth year]           MRN: [id number] Visit Date: 08/21/2020              Requested by: [name], MD [address] [place],  [place] [address] PCP: [name], MD  Chief Complaint  Patient presents with  . Left Leg - Routine Post Op    07/25/20 left BKA       HPI: Patient is a 60 year old gentleman status post necrotic wound left tibia status post transtibial amputation.  Patient has undergone split-thickness skin graft.  He presents at this time with progressive dehiscence of the skin graft with purulent drainage and pain.  Assessment & Plan: Visit Diagnoses:  1. Below-knee amputation of left lower extremity ([place])   2. 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  Patient wants to try everything possible to salvage his below the knee amputation he does not want to consider an above-knee amputation at this time we will plan for surgery tomorrow risk and benefits are discussed we will plan for a wound VAC postoperatively.  Follow-Up Instructions: Return in about 1 week (around 08/28/2020).   Ortho Exam  Patient is alert, oriented, no adenopathy, well-dressed, normal affect, normal respiratory effort. Examination there is breakdown and purulent drainage over the skin graft left below the knee amputation.  After debridement in the office there is healthy granulation tissue overlying the bone.  There is no visible necrotic bone.  Imaging: No results found. No images are attached to the encounter.  Labs: Lab Results  Component Value Date   HGBA1C 11.0 (H) 12/13/2017   REPTSTATUS 07/31/2020 FINAL 07/25/2020   GRAMSTAIN  07/25/2020    FEW WBC PRESENT, PREDOMINANTLY PMN FEW GRAM POSITIVE COCCI    CULT  07/25/2020    FEW STREPTOCOCCUS AGALACTIAE TESTING AGAINST S. AGALACTIAE NOT ROUTINELY PERFORMED DUE TO  PREDICTABILITY OF AMP/PEN/VAN SUSCEPTIBILITY. RARE CANDIDA GLABRATA NO ANAEROBES ISOLATED Performed at [facility], [place] [address] [place], [place] [address]      Lab Results  Component Value Date   ALBUMIN 3.5 07/25/2020   ALBUMIN 4.0 10/26/2018   ALBUMIN 4.2 10/22/2018    Lab Results  Component Value Date   MG 1.4 (L) 10/22/2018   MG 1.9 08/14/2008   MG 2.2 08/11/2008   No results found for: VD25OH  No results found for: PREALBUMIN CBC EXTENDED Latest Ref Rng & Units 07/25/2020 07/13/2019 10/26/2018  WBC 4.0 - 10.5 K/uL 12.5(H) 15.5(H) 10.6(H)  RBC 4.22 - 5.81 MIL/uL 5.32 5.58 5.84(H)  HGB 13.0 - 17.0 g/dL 15.3 16.1 16.3  HCT 39.0 - 52.0 % 46.1 47.1 50.2  PLT 150 - 400 K/uL 243 213 220  NEUTROABS 1.7 - 7.7 K/uL - - 7.0  LYMPHSABS 0.7 - 4.0 K/uL - - 2.7     There is no height or weight on file to calculate BMI.  Orders:  No orders of the defined types were placed in this encounter.  Meds ordered this encounter  Medications  . oxyCODONE-acetaminophen (PERCOCET) 10-325 MG tablet    Sig: Take 1 tablet by mouth every 4 (four) hours as needed for pain.    Dispense:  30 tablet    Refill:  0     Procedures: No procedures  performed  Clinical Data: No additional findings.  ROS:  All other systems negative, except as noted in the HPI. Review of Systems  Objective: Vital Signs: There were no vitals taken for this visit.  Specialty Comments:  No specialty comments available.  PMFS History: Patient Active Problem List   Diagnosis Date Noted  . Abscess of left lower leg   . Osteomyelitis of second toe of right foot ([place]) 07/12/2019  . Below knee amputation status, left 01/25/2018  . Wound dehiscence, surgical   . Foot amputation status, left 01/03/2018  . Status post left foot surgery 12/15/2017  . GERD (gastroesophageal reflux disease) 09/26/2017  . Hyperlipidemia associated with type 2 diabetes mellitus ([place]) 07/08/2017  . Snoring 06/07/2016  .  Chest pain 01/22/2016  . Abnormal nuclear stress test 01/22/2016  . Pain, chronic due to trauma 07/04/2012  . Complex regional pain syndrome I of lower limb 07/04/2012  . COPD (chronic obstructive pulmonary disease) ([place]) 01/27/2011  . Pre-operative cardiovascular examination 01/27/2011  . Nonspecific abnormal electrocardiogram (ECG) (EKG) 01/27/2011  . Murmur 01/27/2011  . DM2 (diabetes mellitus, type 2) ([place])   . HTN (hypertension)   . HLD (hyperlipidemia)   . Tobacco abuse    Past Medical History:  Diagnosis Date  . Acute renal failure ([place])    in setting of NSAID use and orthopedic surgery 2010  . Anxiety and depression   . Chronic diastolic CHF (congestive heart failure) ([place])    a. Echo 6/17: severe conc LVH, vigorous EF, EF 65-70%, no dynamic obstruction, no RWMA, Gr 1 DD, mild TR  //  b. LHC 8/17: no sig CAD, LVEDP 28  . COPD (chronic obstructive pulmonary disease) ([place])   . Diabetic ulcer of left foot ([place])   . DM2 (diabetes mellitus, type 2) ([place])   . Dysrhythmia   . Family history of early CAD   . Fatty liver   . GERD (gastroesophageal reflux disease)   . History of amputation of foot ([place])    L trans-met // R toe  . History of cardiac catheterization    a. [place] 2002: irregs  //  b. LHC in 8/17: no sig CAD, apical DK, hyperdynamic LV, LVEDP 28  . History of kidney stones   . History of nuclear stress test    a. Nuc 7/17: Overall, intermediate risk nuclear stress test secondary to small size of apical lateral defect and reduced ejection fraction.  EF 43%  . HLD (hyperlipidemia)   . HTN (hypertension)   . Injuries     crushing injury to both his feet in February 2010.   Marland Kitchen Kidney calculi   . Palpitations   . PTSD (post-traumatic stress disorder)   . Tobacco abuse     Family History  Problem Relation Age of Onset  . Leukemia Mother 20       died  . Lung cancer Father 58       died  . Heart attack Brother 68  . Heart attack Brother 66  . Hypertension Brother         X3  . Hypertension Sister        X59  . Diabetes Sister   . Stroke Sister   . Diabetes Sister   . Other Brother        Musician accident    Past Surgical History:  Procedure Laterality Date  . AMPUTATION Left 01/03/2018   Procedure: LEFT MIDFOOT AMPUTATION/REVISION MIDAMPUTAION;  Surgeon: Mcarthur Rossetti, MD;  [facility];  Service: Orthopedics;  Laterality: Left;  . AMPUTATION Left 01/25/2018   Procedure: LEFT BELOW KNEE AMPUTATION;  Surgeon: [name], MD;  Location: [facility];  Service: Orthopedics;  Laterality: Left;  . AMPUTATION TOE Right 07/17/2019   Procedure: AMPUTATION RIGHT FOOT 2ND TOE;  Surgeon: Mcarthur Rossetti, MD;  Location: [facility];  Service: Orthopedics;  Laterality: Right;  . BELOW KNEE LEG AMPUTATION Left 01/25/2018  . CARDIAC CATHETERIZATION N/A 01/22/2016   Procedure: Left Heart Cath and Coronary Angiography;  Surgeon: Peter M Martinique, MD;  Location: [facility];  Service: Cardiovascular;  Laterality: N/A;  . FOOT AMPUTATION Bilateral   . I & D EXTREMITY Left 12/15/2017   Procedure: IRRIGATION AND DEBRIDEMENT LEFT FOOT ULCER;  Surgeon: Mcarthur Rossetti, MD;  [facility];  Service: Orthopedics;  Laterality: Left;  . I & D EXTREMITY Left 07/25/2020   Procedure: LEFT BELOW KNEE AMPUTATION ABSCESS EXCISION AND SKIN GRAFT;  Surgeon: [name], MD;  Location: [facility];  Service: Orthopedics;  Laterality: Left;  . LITHOTRIPSY    . TENDON LENGTHENING Bilateral    calf  . TONSILLECTOMY     Social History   Occupational History  . Occupation: DISABLED  Tobacco Use  . Smoking status: Current Every Day Smoker    Packs/day: 1.00    Years: 40.00    Pack years: 40.00    Types: Cigarettes  . Smokeless tobacco: Never Used  Vaping Use  . Vaping Use: Never used  Substance and Sexual Activity  . Alcohol use: No  . Drug use: No  . Sexual activity: Not on file

## 2020-08-21 NOTE — Anesthesia Preprocedure Evaluation (Addendum)
Anesthesia Evaluation  Patient identified by MRN, date of birth, ID band Patient awake    Reviewed: Allergy & Precautions, NPO status , Patient's Chart, lab work & pertinent test results  History of Anesthesia Complications Negative for: history of anesthetic complications  Airway Mallampati: II  TM Distance: >3 FB Neck ROM: Full    Dental  (+)    Pulmonary COPD,  COPD inhaler, Current Smoker,    Pulmonary exam normal        Cardiovascular hypertension, Pt. on medications +CHF  Normal cardiovascular exam     Neuro/Psych Anxiety Depression negative neurological ROS     GI/Hepatic Neg liver ROS, GERD  Controlled and Medicated,  Endo/Other  diabetes (BGL 332, noncompliant with all diabetes meds), Poorly Controlled, Type 2, Oral Hypoglycemic Agents, Insulin Dependent  Renal/GU negative Renal ROS  negative genitourinary   Musculoskeletal negative musculoskeletal ROS (+)   Abdominal   Peds  Hematology negative hematology ROS (+)   Anesthesia Other Findings Day of surgery medications reviewed with patient.  Reproductive/Obstetrics negative OB ROS                            Anesthesia Physical Anesthesia Plan  ASA: III  Anesthesia Plan: General   Post-op Pain Management: GA combined w/ Regional for post-op pain   Induction: Intravenous  PONV Risk Score and Plan: 1 and Treatment may vary due to age or medical condition, Midazolam and Ondansetron  Airway Management Planned: LMA  Additional Equipment: None  Intra-op Plan:   Post-operative Plan: Extubation in OR  Informed Consent: I have reviewed the patients History and Physical, chart, labs and discussed the procedure including the risks, benefits and alternatives for the proposed anesthesia with the patient or authorized representative who has indicated his/her understanding and acceptance.     Dental advisory given  Plan  Discussed with: CRNA  Anesthesia Plan Comments:        Anesthesia Quick Evaluation

## 2020-08-22 ENCOUNTER — Ambulatory Visit (HOSPITAL_COMMUNITY)
Admission: RE | Admit: 2020-08-22 | Discharge: 2020-08-22 | Disposition: A | Discharge status: Home / Self Care | Payer: Medicare HMO | Attending: Orthopedic Surgery | Admitting: Orthopedic Surgery

## 2020-08-22 ENCOUNTER — Other Ambulatory Visit: Payer: Self-pay

## 2020-08-22 ENCOUNTER — Encounter (HOSPITAL_COMMUNITY): Payer: Self-pay | Admitting: Orthopedic Surgery

## 2020-08-22 ENCOUNTER — Ambulatory Visit (HOSPITAL_COMMUNITY): Payer: Medicare HMO | Admitting: Anesthesiology

## 2020-08-22 ENCOUNTER — Encounter (HOSPITAL_COMMUNITY)
Admission: RE | Disposition: A | Discharge status: Home / Self Care | Payer: Self-pay | Source: Home / Self Care | Attending: Orthopedic Surgery

## 2020-08-22 DIAGNOSIS — X58XXXA Exposure to other specified factors, initial encounter: Secondary | ICD-10-CM | POA: Diagnosis not present

## 2020-08-22 DIAGNOSIS — L02416 Cutaneous abscess of left lower limb: Secondary | ICD-10-CM | POA: Diagnosis not present

## 2020-08-22 DIAGNOSIS — F17210 Nicotine dependence, cigarettes, uncomplicated: Secondary | ICD-10-CM | POA: Diagnosis not present

## 2020-08-22 DIAGNOSIS — Z89512 Acquired absence of left leg below knee: Secondary | ICD-10-CM | POA: Insufficient documentation

## 2020-08-22 DIAGNOSIS — T8781 Dehiscence of amputation stump: Secondary | ICD-10-CM | POA: Diagnosis present

## 2020-08-22 DIAGNOSIS — Z885 Allergy status to narcotic agent status: Secondary | ICD-10-CM | POA: Insufficient documentation

## 2020-08-22 DIAGNOSIS — Z888 Allergy status to other drugs, medicaments and biological substances status: Secondary | ICD-10-CM | POA: Diagnosis not present

## 2020-08-22 DIAGNOSIS — Z87892 Personal history of anaphylaxis: Secondary | ICD-10-CM | POA: Diagnosis not present

## 2020-08-22 DIAGNOSIS — I96 Gangrene, not elsewhere classified: Secondary | ICD-10-CM | POA: Diagnosis not present

## 2020-08-22 DIAGNOSIS — E1152 Type 2 diabetes mellitus with diabetic peripheral angiopathy with gangrene: Secondary | ICD-10-CM | POA: Diagnosis not present

## 2020-08-22 HISTORY — PX: I & D EXTREMITY: SHX5045

## 2020-08-22 LAB — COMPREHENSIVE METABOLIC PANEL
ALT: 22 U/L (ref 0–44)
AST: 25 U/L (ref 15–41)
Albumin: 3.5 g/dL (ref 3.5–5.0)
Alkaline Phosphatase: 90 U/L (ref 38–126)
Anion gap: 10 (ref 5–15)
BUN: 10 mg/dL (ref 6–20)
CO2: 23 mmol/L (ref 22–32)
Calcium: 9.1 mg/dL (ref 8.9–10.3)
Chloride: 100 mmol/L (ref 98–111)
Creatinine, Ser: 0.6 mg/dL — ABNORMAL LOW (ref 0.61–1.24)
GFR, Estimated: >60 mL/min (ref >60)
Glucose, Bld: 321 mg/dL — ABNORMAL HIGH (ref 70–99)
Potassium: 3.7 mmol/L (ref 3.5–5.1)
Sodium: 133 mmol/L — ABNORMAL LOW (ref 135–145)
Total Bilirubin: 0.7 mg/dL (ref 0.3–1.2)
Total Protein: 7 g/dL (ref 6.5–8.1)

## 2020-08-22 LAB — CBC
HCT: 45.5 % (ref 39.0–52.0)
Hemoglobin: 14.8 g/dL (ref 13.0–17.0)
MCH: 28 pg (ref 26.0–34.0)
MCHC: 32.5 g/dL (ref 30.0–36.0)
MCV: 86.2 fL (ref 80.0–100.0)
Platelets: 210 10*3/uL (ref 150–400)
RBC: 5.28 MIL/uL (ref 4.22–5.81)
RDW: 13.2 % (ref 11.5–15.5)
WBC: 13.1 10*3/uL — ABNORMAL HIGH (ref 4.0–10.5)
nRBC: 0 % (ref 0.0–0.2)

## 2020-08-22 LAB — GLUCOSE, CAPILLARY
Glucose-Capillary: 332 mg/dL — ABNORMAL HIGH (ref 70–99)
Glucose-Capillary: 294 mg/dL — ABNORMAL HIGH (ref 70–99)

## 2020-08-22 SURGERY — IRRIGATION AND DEBRIDEMENT EXTREMITY
Anesthesia: General | Laterality: Left

## 2020-08-22 MED ORDER — MIDAZOLAM HCL 2 MG/2ML IJ SOLN
INTRAMUSCULAR | Status: AC
  Filled 2020-08-22: qty 2

## 2020-08-22 MED ORDER — BUPIVACAINE-EPINEPHRINE (PF) 0.5% -1:200000 IJ SOLN
INTRAMUSCULAR | Status: DC | PRN
  Administered 2020-08-22: 10 mL via PERINEURAL
  Administered 2020-08-22: 20 mL via PERINEURAL

## 2020-08-22 MED ORDER — CLONIDINE HCL (ANALGESIA) 100 MCG/ML EP SOLN
EPIDURAL | Status: DC | PRN
  Administered 2020-08-22: 33 ug
  Administered 2020-08-22: 67 ug

## 2020-08-22 MED ORDER — OXYCODONE HCL 5 MG/5ML PO SOLN
5.0000 mg | Freq: Once | ORAL | Status: AC | PRN
Start: 2020-08-22 — End: 2020-08-22

## 2020-08-22 MED ORDER — PROPOFOL 10 MG/ML IV BOLUS
INTRAVENOUS | Status: DC | PRN
  Administered 2020-08-22: 160 mg via INTRAVENOUS

## 2020-08-22 MED ORDER — VANCOMYCIN HCL 500 MG IV SOLR
INTRAVENOUS | Status: AC
  Filled 2020-08-22: qty 500

## 2020-08-22 MED ORDER — PROPOFOL 10 MG/ML IV BOLUS
INTRAVENOUS | Status: AC
  Filled 2020-08-22: qty 20

## 2020-08-22 MED ORDER — MIDAZOLAM HCL 2 MG/2ML IJ SOLN
INTRAMUSCULAR | Status: DC | PRN
  Administered 2020-08-22: 1 mg via INTRAVENOUS

## 2020-08-22 MED ORDER — ORAL CARE MOUTH RINSE: 15.0000 mL | Freq: Once | OROMUCOSAL | Status: AC

## 2020-08-22 MED ORDER — FENTANYL CITRATE (PF) 100 MCG/2ML IJ SOLN
INTRAMUSCULAR | Status: AC
  Administered 2020-08-22: 50 ug via INTRAVENOUS
  Filled 2020-08-22: qty 2

## 2020-08-22 MED ORDER — VANCOMYCIN HCL 500 MG IV SOLR
INTRAVENOUS | Status: DC | PRN
  Administered 2020-08-22: 500 mg

## 2020-08-22 MED ORDER — PROMETHAZINE HCL 25 MG/ML IJ SOLN: 6.2500 mg | INTRAMUSCULAR | Status: DC | PRN

## 2020-08-22 MED ORDER — INSULIN ASPART 100 UNIT/ML ~~LOC~~ SOLN
10.0000 [IU] | Freq: Once | SUBCUTANEOUS | Status: AC
  Administered 2020-08-22: 10 [IU] via SUBCUTANEOUS
  Filled 2020-08-22: qty 1
  Filled 2020-08-22: qty 0.1

## 2020-08-22 MED ORDER — LIDOCAINE 2% (20 MG/ML) 5 ML SYRINGE
INTRAMUSCULAR | Status: DC | PRN
  Administered 2020-08-22: 100 mg via INTRAVENOUS

## 2020-08-22 MED ORDER — FENTANYL CITRATE (PF) 100 MCG/2ML IJ SOLN
INTRAMUSCULAR | Status: DC | PRN
  Administered 2020-08-22 (×3): 50 ug via INTRAVENOUS
  Administered 2020-08-22: 25 ug via INTRAVENOUS

## 2020-08-22 MED ORDER — 0.9 % SODIUM CHLORIDE (POUR BTL) OPTIME
TOPICAL | Status: DC | PRN
  Administered 2020-08-22: 1000 mL

## 2020-08-22 MED ORDER — ONDANSETRON HCL 4 MG/2ML IJ SOLN
INTRAMUSCULAR | Status: AC
  Filled 2020-08-22: qty 2

## 2020-08-22 MED ORDER — OXYCODONE HCL 5 MG PO TABS
ORAL_TABLET | ORAL | Status: AC
  Administered 2020-08-22: 5 mg via ORAL
  Filled 2020-08-22: qty 1

## 2020-08-22 MED ORDER — CEFAZOLIN SODIUM-DEXTROSE 2-4 GM/100ML-% IV SOLN
2.0000 g | INTRAVENOUS | Status: DC
  Filled 2020-08-22: qty 100

## 2020-08-22 MED ORDER — OXYCODONE HCL 5 MG PO TABS: 5.0000 mg | ORAL_TABLET | Freq: Once | ORAL | Status: AC | PRN

## 2020-08-22 MED ORDER — FENTANYL CITRATE (PF) 100 MCG/2ML IJ SOLN
25.0000 ug | INTRAMUSCULAR | Status: DC | PRN
Start: 2020-08-22 — End: 2020-08-22
  Administered 2020-08-22: 50 ug via INTRAVENOUS

## 2020-08-22 MED ORDER — FENTANYL CITRATE (PF) 250 MCG/5ML IJ SOLN
INTRAMUSCULAR | Status: AC
  Filled 2020-08-22: qty 5

## 2020-08-22 MED ORDER — ONDANSETRON HCL 4 MG/2ML IJ SOLN
INTRAMUSCULAR | Status: DC | PRN
  Administered 2020-08-22: 4 mg via INTRAVENOUS

## 2020-08-22 MED ORDER — ACETAMINOPHEN 500 MG PO TABS
1000.0000 mg | ORAL_TABLET | Freq: Once | ORAL | Status: AC
  Administered 2020-08-22: 1000 mg via ORAL
  Filled 2020-08-22: qty 2

## 2020-08-22 MED ORDER — CHLORHEXIDINE GLUCONATE 0.12 % MT SOLN
15.0000 mL | Freq: Once | OROMUCOSAL | Status: AC
  Administered 2020-08-22: 15 mL via OROMUCOSAL
  Filled 2020-08-22: qty 15

## 2020-08-22 MED ORDER — LACTATED RINGERS IV SOLN: INTRAVENOUS | Status: DC

## 2020-08-22 SURGICAL SUPPLY — 39 items
BLADE SURG 21 STRL SS (BLADE) ×2 IMPLANT
BNDG COHESIVE 6X5 TAN NS LF (GAUZE/BANDAGES/DRESSINGS) ×1 IMPLANT
BNDG COHESIVE 6X5 TAN STRL LF (GAUZE/BANDAGES/DRESSINGS) IMPLANT
BNDG GAUZE ELAST 4 BULKY (GAUZE/BANDAGES/DRESSINGS) ×4 IMPLANT
COVER SURGICAL LIGHT HANDLE (MISCELLANEOUS) ×4 IMPLANT
COVER WAND RF STERILE (DRAPES) IMPLANT
DRAPE DERMATAC (DRAPES) ×2 IMPLANT
DRAPE U-SHAPE 47X51 STRL (DRAPES) ×2 IMPLANT
DRESSING VERAFLO CLEANSE CC (GAUZE/BANDAGES/DRESSINGS) IMPLANT
DRSG ADAPTIC 3X8 NADH LF (GAUZE/BANDAGES/DRESSINGS) ×2 IMPLANT
DRSG DERMACEA 8X12 NADH (GAUZE/BANDAGES/DRESSINGS) ×1 IMPLANT
DRSG VERAFLO CLEANSE CC (GAUZE/BANDAGES/DRESSINGS) ×2
DURAPREP 26ML APPLICATOR (WOUND CARE) ×2 IMPLANT
ELECT REM PT RETURN 9FT ADLT (ELECTROSURGICAL)
ELECTRODE REM PT RTRN 9FT ADLT (ELECTROSURGICAL) IMPLANT
GAUZE SPONGE 4X4 12PLY STRL (GAUZE/BANDAGES/DRESSINGS) ×2 IMPLANT
GLOVE BIOGEL PI IND STRL 9 (GLOVE) ×1 IMPLANT
GLOVE BIOGEL PI INDICATOR 9 (GLOVE) ×1
GLOVE SURG ORTHO 9.0 STRL STRW (GLOVE) ×2 IMPLANT
GOWN STRL REUS W/ TWL XL LVL3 (GOWN DISPOSABLE) ×2 IMPLANT
GOWN STRL REUS W/TWL XL LVL3 (GOWN DISPOSABLE) ×4
GRAFT SKIN WND OMEGA3 7X10 (Tissue) ×2 IMPLANT
GRAFT SKN 7X10XSTRL LF DISP (Tissue) IMPLANT
HANDPIECE INTERPULSE COAX TIP (DISPOSABLE)
KIT BASIN OR (CUSTOM PROCEDURE TRAY) ×2 IMPLANT
KIT TURNOVER KIT B (KITS) ×2 IMPLANT
MANIFOLD NEPTUNE II (INSTRUMENTS) ×2 IMPLANT
NS IRRIG 1000ML POUR BTL (IV SOLUTION) ×2 IMPLANT
PACK ORTHO EXTREMITY (CUSTOM PROCEDURE TRAY) ×2 IMPLANT
PAD ARMBOARD 7.5X6 YLW CONV (MISCELLANEOUS) ×4 IMPLANT
PAD NEG PRESSURE SENSATRAC (MISCELLANEOUS) ×1 IMPLANT
SET HNDPC FAN SPRY TIP SCT (DISPOSABLE) IMPLANT
STOCKINETTE IMPERVIOUS 9X36 MD (GAUZE/BANDAGES/DRESSINGS) IMPLANT
SUT ETHILON 2 0 PSLX (SUTURE) ×2 IMPLANT
SWAB COLLECTION DEVICE MRSA (MISCELLANEOUS) ×2 IMPLANT
SWAB CULTURE ESWAB REG 1ML (MISCELLANEOUS) IMPLANT
TOWEL GREEN STERILE (TOWEL DISPOSABLE) ×2 IMPLANT
TUBE CONNECTING 12X1/4 (SUCTIONS) ×2 IMPLANT
YANKAUER SUCT BULB TIP NO VENT (SUCTIONS) ×2 IMPLANT

## 2020-08-22 NOTE — H&P (Signed)
Troy [name] is an 60 y.o. male.   Chief Complaint: non healing wound left below knee amputation HPI: Patient is a 60 year old gentleman status post necrotic wound left tibia status post transtibial amputation.  Patient has undergone split-thickness skin graft.  He presents at this time with progressive dehiscence of the skin graft with purulent drainage and pain.  Past Medical History:  Diagnosis Date  . Acute renal failure ([place])    in setting of NSAID use and orthopedic surgery 2010  . Anxiety and depression   . Chronic diastolic CHF (congestive heart failure) ([place])    a. Echo 6/17: severe conc LVH, vigorous EF, EF 65-70%, no dynamic obstruction, no RWMA, Gr 1 DD, mild TR  //  b. LHC 8/17: no sig CAD, LVEDP 28  . COPD (chronic obstructive pulmonary disease) ([place])   . Diabetic ulcer of left foot ([place])   . DM2 (diabetes mellitus, type 2) ([place])   . Dysrhythmia   . Family history of early CAD   . Fatty liver   . GERD (gastroesophageal reflux disease)   . History of amputation of foot ([place])    L trans-met // R toe  . History of cardiac catheterization    a. [place] 2002: irregs  //  b. LHC in 8/17: no sig CAD, apical DK, hyperdynamic LV, LVEDP 28  . History of kidney stones   . History of nuclear stress test    a. Nuc 7/17: Overall, intermediate risk nuclear stress test secondary to small size of apical lateral defect and reduced ejection fraction.  EF 43%  . HLD (hyperlipidemia)   . HTN (hypertension)   . Injuries     crushing injury to both his feet in February 2010.   Marland Kitchen Kidney calculi   . Palpitations   . PTSD (post-traumatic stress disorder)   . Tobacco abuse     Past Surgical History:  Procedure Laterality Date  . AMPUTATION Left 01/03/2018   Procedure: LEFT MIDFOOT AMPUTATION/REVISION MIDAMPUTAION;  Surgeon: Mcarthur Rossetti, MD;  Location: [facility];  Service: Orthopedics;  Laterality: Left;  . AMPUTATION Left 01/25/2018   Procedure: LEFT BELOW KNEE AMPUTATION;  Surgeon:  [name], MD;  Location: [facility];  Service: Orthopedics;  Laterality: Left;  . AMPUTATION TOE Right 07/17/2019   Procedure: AMPUTATION RIGHT FOOT 2ND TOE;  Surgeon: Mcarthur Rossetti, MD;  Location: [facility];  Service: Orthopedics;  Laterality: Right;  . BELOW KNEE LEG AMPUTATION Left 01/25/2018  . CARDIAC CATHETERIZATION N/A 01/22/2016   Procedure: Left Heart Cath and Coronary Angiography;  Surgeon: Peter M Martinique, MD;  Location: [facility];  Service: Cardiovascular;  Laterality: N/A;  . FOOT AMPUTATION Bilateral   . I & D EXTREMITY Left 12/15/2017   Procedure: IRRIGATION AND DEBRIDEMENT LEFT FOOT ULCER;  Surgeon: Mcarthur Rossetti, MD;  Location: [facility];  Service: Orthopedics;  Laterality: Left;  . I & D EXTREMITY Left 07/25/2020   Procedure: LEFT BELOW KNEE AMPUTATION ABSCESS EXCISION AND SKIN GRAFT;  Surgeon: [name], MD;  Location: [facility];  Service: Orthopedics;  Laterality: Left;  . LITHOTRIPSY    . TENDON LENGTHENING Bilateral    calf  . TONSILLECTOMY      Family History  Problem Relation Age of Onset  . Leukemia Mother 49       died  . Lung cancer Father 62       died  . Heart attack Brother 53  . Heart attack Brother  57  . Hypertension Brother        X3  . Hypertension Sister        X54  . Diabetes Sister   . Stroke Sister   . Diabetes Sister   . Other Brother        Musician accident   Social History:  reports that he has been smoking cigarettes. He has a 40.00 pack-year smoking history. He has never used smokeless tobacco. He reports that he does not drink alcohol and does not use drugs.  Allergies:  Allergies  Allergen Reactions  . Hydromorphone Hcl Er Anaphylaxis and Other (See Comments)    Allergic to DYE in extended-release tablet, can tolerate other forms of hydromorphone  . Tapentadol Anaphylaxis, Swelling and Other (See Comments)    THROAT ANGIOEDEMA Nucynta [Tapentadol Hydrochloride]  . Exalamide Other (See Comments)     UNSPECIFIED REACTION     No medications prior to admission.    Results for orders placed or performed during the hospital encounter of 08/21/20 (from the past 48 hour(s))  SARS CORONAVIRUS 2 (TAT 6-24 HRS) Nasopharyngeal Nasopharyngeal Swab     Status: None   Collection Time: 08/21/20 10:48 AM   Specimen: Nasopharyngeal Swab  Result Value Ref Range   SARS Coronavirus 2 NEGATIVE NEGATIVE    Comment: (NOTE) SARS-CoV-2 target nucleic acids are NOT DETECTED.  The SARS-CoV-2 RNA is generally detectable in upper and lower respiratory specimens during the acute phase of infection. Negative results do not preclude SARS-CoV-2 infection, do not rule out co-infections with other pathogens, and should not be used as the sole basis for treatment or other patient management decisions. Negative results must be combined with clinical observations, patient history, and epidemiological information. The expected result is Negative.  Fact Sheet for Patients: SugarRoll.be  Fact Sheet for Healthcare Providers: https://www.woods-mathews.com/  This test is not yet approved or cleared by the [place] FDA and  has been authorized for detection and/or diagnosis of SARS-CoV-2 by FDA under an Emergency Use Authorization (EUA). This EUA will remain  in effect (meaning this test can be used) for the duration of the COVID-19 declaration under Se ction 564(b)(1) of the Act, 21 U.S.C. section 360bbb-3(b)(1), unless the authorization is terminated or revoked sooner.  Performed at [facility], [place] [address], [place], [place] [address]    No results found.  Review of Systems  All other systems reviewed and are negative.   There were no vitals taken for this visit. Physical Exam  Patient is alert, oriented, no adenopathy, well-dressed, normal affect, normal respiratory effort. Examination there is breakdown and purulent drainage over the skin  graft left below the knee amputation.  After debridement in the office there is healthy granulation tissue overlying the bone.  There is no visible necrotic bone.heart RRR Lungs Clear Assessment/Plan 1. Below-knee amputation of left lower extremity ([place])   2. Skin ulcer of left lower leg with necrosis of bone ([place])     Plan: We will plan for debridement skin graft and oral antibiotics.  Patient wants to try everything possible to salvage his below the knee amputation he does not want to consider an above-knee amputation at this time we will plan for surgery tomorrow risk and benefits are discussed we will plan for a wound VAC postoperatively.   Bevely Palmer Persons, PA 08/22/2020, 5:28 AM

## 2020-08-22 NOTE — Anesthesia Postprocedure Evaluation (Signed)
Anesthesia Post Note  Patient: Troy [name]  Procedure(s) Performed: DEBRIDEMENT LEFT BELOW KNEE AMPUTATION AND APPLY KERECIS SKIN GRAFT (Left )     Patient location during evaluation: PACU Anesthesia Type: General Level of consciousness: awake and alert and oriented Pain management: pain level controlled Vital Signs Assessment: post-procedure vital signs reviewed and stable Respiratory status: spontaneous breathing, nonlabored ventilation and respiratory function stable Cardiovascular status: blood pressure returned to baseline Postop Assessment: no apparent nausea or vomiting Anesthetic complications: no   No complications documented.  Last Vitals:  Vitals:   08/22/20 0840 08/22/20 0855  BP: 129/80 110/72  Pulse: 97 95  Resp: 10 14  Temp:  (!) 36.2 C  SpO2: 99% 96%    Last Pain:  Vitals:   08/22/20 0855  TempSrc:   PainSc: [place]

## 2020-08-22 NOTE — Anesthesia Procedure Notes (Signed)
Procedure Name: LMA Insertion Date/Time: 08/22/2020 7:33 AM Performed by: Leonor Liv, CRNA Pre-anesthesia Checklist: Patient identified, Emergency Drugs available, Suction available and Patient being monitored Patient Re-evaluated:Patient Re-evaluated prior to induction Oxygen Delivery Method: Circle System Utilized Preoxygenation: Pre-oxygenation with 100% oxygen Induction Type: IV induction Ventilation: Mask ventilation without difficulty LMA: LMA inserted LMA Size: 5.0 Number of attempts: 1 Placement Confirmation: positive ETCO2 Tube secured with: Tape Dental Injury: Teeth and Oropharynx as per pre-operative assessment

## 2020-08-22 NOTE — Op Note (Signed)
08/22/2020  8:12 AM  PATIENT:  Troy [name]    PRE-OPERATIVE DIAGNOSIS:  Wound Dehiscence Left Below Knee Amputation  POST-OPERATIVE DIAGNOSIS:  Same  PROCEDURE: Excisional DEBRIDEMENT LEFT BELOW KNEE AMPUTATION AND APPLY KERECIS SKIN GRAFT Local tissue rearrangement for wound closure 7 x 10 cm. Application of Kerecis skin graft 7 x 10 cm. Application of cleanse choice wound VAC. Tissue cultures sent.  SURGEON:  [name], MD  PHYSICIAN ASSISTANT:None ANESTHESIA:   General  PREOPERATIVE INDICATIONS:  Troy [name] is a  60 y.o. male with a diagnosis of Wound Dehiscence Left Below Knee Amputation who failed conservative measures and elected for surgical management.    The risks benefits and alternatives were discussed with the patient preoperatively including but not limited to the risks of infection, bleeding, nerve injury, cardiopulmonary complications, the need for revision surgery, among others, and the patient was willing to proceed.  OPERATIVE IMPLANTS: Vancomycin 500 mg. Kerecis skin graft 7 x 10 cm. Cleanse choice wound VAC sponges x2.  @ENCIMAGES @  OPERATIVE FINDINGS: Necrotic infected tissue sent for tissue cultures.  OPERATIVE PROCEDURE: Patient was brought the operating room and underwent a general anesthesia after regional block.  After adequate levels anesthesia were obtained patient's left lower extremity was prepped using DuraPrep draped into a sterile field a timeout was called.  Elliptical incision was made over the tibia to excise the necrotic wound breakdown.  The necrotic tissue was sent for cultures.  A rondure and 21 blade knife were used to excise skin and soft tissue muscle there was no exposed bone.  The wound was irrigated with normal saline the wound was 7 x 10 cm.  The 7 x 10 cm keratosis skin graft was applied.  Local tissue rearrangement was then used to close the wound 7 x 10 cm with 2-0 nylon.  The cleanse choice reticulated followed by the solid  foam was applied to the wound this was covered with derma tack and secured with Ioban.  There was a good suction fit patient was extubated taken the PACU in stable condition.  Debridement type: Excisional Debridement  Side: left  Body Location: Left leg proximal anterior tibia  Tools used for debridement: scalpel, curette and rongeur  Pre-debridement Wound size (cm):   Length: 3        Width: 3     Depth: 1   Post-debridement Wound size (cm):   Length: 10        Width: 7     Depth: 1   Debridement depth beyond dead/damaged tissue down to healthy viable tissue: yes  Tissue layer involved: skin, subcutaneous tissue, muscle / fascia  Nature of tissue removed: Slough, Necrotic, Devitalized Tissue, Non-viable tissue and Purulence  Irrigation volume: 1 liter     Irrigation fluid type: Normal Saline       DISCHARGE PLANNING:  Antibiotic duration: Topical vancomycin will add oral antibiotics pending the culture sensitivities.  Weightbearing: Nonweightbearing on the left  Pain medication: Patient was called in a prescription for Percocet yesterday  Dressing care/ Wound VAC: Continue wound VAC for 1 week  Ambulatory devices: Walker and wheelchair  Discharge to: Home.  Follow-up: In the office 1 week post operative.

## 2020-08-22 NOTE — Interval H&P Note (Signed)
History and Physical Interval Note:  08/22/2020 7:10 AM  Troy [name]  has presented today for surgery, with the diagnosis of Wound Dehiscence Left Below Knee Amputation.  The various methods of treatment have been discussed with the patient and family. After consideration of risks, benefits and other options for treatment, the patient has consented to  Procedure(s): DEBRIDEMENT LEFT BELOW KNEE AMPUTATION AND APPLY KERECIS SKIN GRAFT (Left) as a surgical intervention.  The patient's history has been reviewed, patient examined, no change in status, stable for surgery.  I have reviewed the patient's chart and labs.  Questions were answered to the patient's satisfaction.     [name]

## 2020-08-22 NOTE — Progress Notes (Signed)
Dr. Daiva Huge made aware patient's CBG 332.  Received verbal order for 10 units novolog SQ.  Recheck CBG in 2 hours

## 2020-08-22 NOTE — Anesthesia Procedure Notes (Addendum)
Anesthesia Regional Block: Adductor canal block   Pre-Anesthetic Checklist: ,, timeout performed, Correct Patient, Correct Site, Correct Laterality, Correct Procedure, Correct Position, site marked, Risks and benefits discussed, pre-op evaluation,  At surgeon's request and post-op pain management  Laterality: Left  Prep: Maximum Sterile Barrier Precautions used, chloraprep       Needles:  Injection technique: Single-shot  Needle Type: Echogenic Stimulator Needle     Needle Length: 9cm  Needle Gauge: 22     Additional Needles:   Procedures:,,,, ultrasound used (permanent image in chart),,,,  Narrative:  Start time: 08/22/2020 7:01 AM End time: 08/22/2020 7:04 AM Injection made incrementally with aspirations every 5 mL.  Performed by: Personally  Anesthesiologist: Brennan Bailey, MD  Additional Notes: Risks, benefits, and alternative discussed. Patient gave consent for procedure. Patient prepped and draped in sterile fashion. Sedation administered, patient remains easily responsive to voice. Relevant anatomy identified with ultrasound guidance. Local anesthetic given in 5cc increments with no signs or symptoms of intravascular injection. No pain or paraesthesias with injection. Patient monitored throughout procedure with signs of LAST or immediate complications. Tolerated well. Ultrasound image placed in chart.  Tawny Asal, MD

## 2020-08-22 NOTE — Transfer of Care (Signed)
Immediate Anesthesia Transfer of Care Note  Patient: Troy [name]  Procedure(s) Performed: DEBRIDEMENT LEFT BELOW KNEE AMPUTATION AND APPLY KERECIS SKIN GRAFT (Left )  Patient Location: PACU  Anesthesia Type:GA combined with regional for post-op pain  Level of Consciousness: drowsy and patient cooperative  Airway & Oxygen Therapy: Patient Spontanous Breathing and Patient connected to nasal cannula oxygen  Post-op Assessment: Report given to RN, Post -op Vital signs reviewed and stable and Patient moving all extremities  Post vital signs: Reviewed and stable  Last Vitals:  Vitals Value Taken Time  BP 143/83 08/22/20 0807  Temp    Pulse 107 08/22/20 0809  Resp 13 08/22/20 0809  SpO2 99 % 08/22/20 0809  Vitals shown include unvalidated device data.  Last Pain:  Vitals:   08/22/20 0629  TempSrc:   PainSc: 8       Patients Stated Pain Goal: 3 (16/57/90 [id number])  Complications: No complications documented.

## 2020-08-22 NOTE — Anesthesia Procedure Notes (Addendum)
Anesthesia Regional Block: Popliteal block   Pre-Anesthetic Checklist: ,, timeout performed, Correct Patient, Correct Site, Correct Laterality, Correct Procedure, Correct Position, site marked, Risks and benefits discussed, pre-op evaluation,  At surgeon's request and post-op pain management  Laterality: Left  Prep: Maximum Sterile Barrier Precautions used, chloraprep       Needles:  Injection technique: Single-shot  Needle Type: Echogenic Stimulator Needle     Needle Length: 9cm  Needle Gauge: 22     Additional Needles:   Procedures:,,,, ultrasound used (permanent image in chart),,,,  Narrative:  Start time: 08/22/2020 7:04 AM End time: 08/22/2020 7:06 AM Injection made incrementally with aspirations every 5 mL.  Performed by: Personally  Anesthesiologist: Brennan Bailey, MD  Additional Notes: Risks, benefits, and alternative discussed. Patient gave consent for procedure. Patient prepped and draped in sterile fashion. Sedation administered, patient remains easily responsive to voice. Relevant anatomy identified with ultrasound guidance. Local anesthetic given in 5cc increments with no signs or symptoms of intravascular injection. No pain or paraesthesias with injection. Patient monitored throughout procedure with signs of LAST or immediate complications. Tolerated well. Ultrasound image placed in chart.  Tawny Asal, MD

## 2020-08-25 ENCOUNTER — Telehealth: Payer: Self-pay

## 2020-08-25 ENCOUNTER — Other Ambulatory Visit: Payer: Self-pay | Admitting: Orthopedic Surgery

## 2020-08-25 ENCOUNTER — Encounter (HOSPITAL_COMMUNITY): Payer: Self-pay | Admitting: Orthopedic Surgery

## 2020-08-25 MED ORDER — CEPHALEXIN 500 MG PO CAPS: 500.0000 mg | ORAL_CAPSULE | Freq: Three times a day (TID) | ORAL | 0 refills | Status: DC

## 2020-08-25 MED ORDER — CIPROFLOXACIN HCL 500 MG PO TABS: 500.0000 mg | ORAL_TABLET | Freq: Two times a day (BID) | ORAL | 0 refills | Status: DC

## 2020-08-25 NOTE — Telephone Encounter (Signed)
-----   Message from [name], MD sent at 08/25/2020  8:13 AM EST ----- Cultures are positive group B strep and Klebsiella.  Antibiotics prescription sent for both Keflex and Cipro.  Make sure patient takes a probiotic once a day to minimize risk of C. difficile

## 2020-08-25 NOTE — Telephone Encounter (Signed)
Lm on vm to advise of culture result and antibiotics that have been called in and also to advise of probiotic coverage. Will hold this message and reach out again to make sure that pt understands the instructions.

## 2020-08-27 LAB — AEROBIC/ANAEROBIC CULTURE W GRAM STAIN (SURGICAL/DEEP WOUND)

## 2020-08-27 NOTE — Telephone Encounter (Signed)
I confirmed with patient's wife that the message was received he is taking the ABX and probiotics. Appt 08/29/20

## 2020-08-28 NOTE — Addendum Note (Signed)
Addendum  created 08/28/20 1737 by Brennan Bailey, MD   Clinical Note Signed, Intraprocedure Blocks edited

## 2020-08-29 ENCOUNTER — Ambulatory Visit (INDEPENDENT_AMBULATORY_CARE_PROVIDER_SITE_OTHER): Payer: Medicare HMO | Admitting: Physician Assistant

## 2020-08-29 ENCOUNTER — Encounter: Payer: Self-pay | Admitting: Physician Assistant

## 2020-08-29 DIAGNOSIS — L97512 Non-pressure chronic ulcer of other part of right foot with fat layer exposed: Secondary | ICD-10-CM

## 2020-08-29 NOTE — Progress Notes (Signed)
Office Visit Note   Patient: Troy [name]           Date of Birth: 1961/03/04           MRN: [id number] Visit Date: 08/29/2020              Requested by: [name], MD [id number] [place],  [place] [id number] PCP: [name], MD  Chief Complaint  Patient presents with  . Left Leg - Routine Post Op    08/22/20 deb left BKA Kerecis skin graft       HPI: Patient is status post irrigation debridement left anterior below-knee amputation stump.  He is currently taking Keflex and ciprofloxacin as well as a probiotic  Assessment & Plan: Visit Diagnoses: No diagnosis found.  Plan: We will apply a wrap today.  He will return next week with his large vive sock.  May continue wrapping or place sock  Follow-Up Instructions: No follow-ups on file.   Ortho Exam  Patient is alert, oriented, no adenopathy, well-dressed, normal affect, normal respiratory effort. No surrounding or ascending cellulitis no foul odor surgical sutures are in place.  Wound edges appear healthy  Imaging: No results found.   Labs: Lab Results  Component Value Date   HGBA1C 11.0 (H) 12/13/2017   REPTSTATUS 08/27/2020 FINAL 08/22/2020   GRAMSTAIN  08/22/2020    FEW WBC PRESENT,BOTH PMN AND MONONUCLEAR ABUNDANT GRAM POSITIVE COCCI IN PAIRS    CULT  08/22/2020    ABUNDANT GROUP B STREP(S.AGALACTIAE)ISOLATED FEW KLEBSIELLA OXYTOCA TESTING AGAINST S. AGALACTIAE NOT ROUTINELY PERFORMED DUE TO PREDICTABILITY OF AMP/PEN/VAN SUSCEPTIBILITY. NO ANAEROBES ISOLATED Performed at [facility], [place] [address] [place], [place] [address]    [place] OXYTOCA 08/22/2020     Lab Results  Component Value Date   ALBUMIN 3.5 08/22/2020   ALBUMIN 3.5 07/25/2020   ALBUMIN 4.0 10/26/2018    Lab Results  Component Value Date   MG 1.4 (L) 10/22/2018   MG 1.9 08/14/2008   MG 2.2 08/11/2008   No results found for: VD25OH  No results found for: PREALBUMIN CBC EXTENDED Latest Ref Rng &  Units 08/22/2020 07/25/2020 07/13/2019  WBC 4.0 - 10.5 K/uL 13.1(H) 12.5(H) 15.5(H)  RBC 4.22 - 5.81 MIL/uL 5.28 5.32 5.58  HGB 13.0 - 17.0 g/dL 14.8 15.3 16.1  HCT 39.0 - 52.0 % 45.5 46.1 47.1  PLT 150 - 400 K/uL 210 243 213  NEUTROABS 1.7 - 7.7 K/uL - - -  LYMPHSABS 0.7 - 4.0 K/uL - - -     There is no height or weight on file to calculate BMI.  Orders:  No orders of the defined types were placed in this encounter.  No orders of the defined types were placed in this encounter.    Procedures: No procedures performed  Clinical Data: No additional findings.  ROS:  All other systems negative, except as noted in the HPI. Review of Systems  Objective: Vital Signs: There were no vitals taken for this visit.  Specialty Comments:  No specialty comments available.  PMFS History: Patient Active Problem List   Diagnosis Date Noted  . Abscess of leg without foot, left   . Osteomyelitis of second toe of right foot ([place]) 07/12/2019  . Below knee amputation status, left 01/25/2018  . Wound dehiscence, surgical   . Foot amputation status, left 01/03/2018  . Status post left foot surgery 12/15/2017  . GERD (gastroesophageal reflux disease) 09/26/2017  . Hyperlipidemia associated with type  2 diabetes mellitus ([place]) 07/08/2017  . Snoring 06/07/2016  . Chest pain 01/22/2016  . Abnormal nuclear stress test 01/22/2016  . Pain, chronic due to trauma 07/04/2012  . Complex regional pain syndrome I of lower limb 07/04/2012  . COPD (chronic obstructive pulmonary disease) ([place]) 01/27/2011  . Pre-operative cardiovascular examination 01/27/2011  . Nonspecific abnormal electrocardiogram (ECG) (EKG) 01/27/2011  . Murmur 01/27/2011  . DM2 (diabetes mellitus, type 2) ([place])   . HTN (hypertension)   . HLD (hyperlipidemia)   . Tobacco abuse    Past Medical History:  Diagnosis Date  . Acute renal failure ([place])    in setting of NSAID use and orthopedic surgery 2010  . Anxiety and depression    . Chronic diastolic CHF (congestive heart failure) ([place])    a. Echo 6/17: severe conc LVH, vigorous EF, EF 65-70%, no dynamic obstruction, no RWMA, Gr 1 DD, mild TR  //  b. LHC 8/17: no sig CAD, LVEDP 28  . COPD (chronic obstructive pulmonary disease) ([place])   . Diabetic ulcer of left foot ([place])   . DM2 (diabetes mellitus, type 2) ([place])   . Dysrhythmia   . Family history of early CAD   . Fatty liver   . GERD (gastroesophageal reflux disease)   . History of amputation of foot ([place])    L trans-met // R toe  . History of cardiac catheterization    a. [place] 2002: irregs  //  b. LHC in 8/17: no sig CAD, apical DK, hyperdynamic LV, LVEDP 28  . History of kidney stones   . History of nuclear stress test    a. Nuc 7/17: Overall, intermediate risk nuclear stress test secondary to small size of apical lateral defect and reduced ejection fraction.  EF 43%  . HLD (hyperlipidemia)   . HTN (hypertension)   . Injuries     crushing injury to both his feet in February 2010.   Mar[place] Kitchen Kidney calculi   . Palpitations   . PTSD (post-traumatic stress disorder)   . Tobacco abuse     Family History  Problem Relation Age of Onset  . Leukemia Mother 67       died  . Lung cancer Father 27       died  . Heart attack Brother 70  . Heart attack Brother 84  . Hypertension Brother        X3  . Hypertension Sister        X44  . Diabetes Sister   . Stroke Sister   . Diabetes Sister   . Other Brother        Musician accident    Past Surgical History:  Procedure Laterality Date  . AMPUTATION Left 01/03/2018   Procedure: LEFT MIDFOOT AMPUTATION/REVISION MIDAMPUTAION;  Surgeon: Mcarthur Rossetti, MD;  Location: [facility];  Service: Orthopedics;  Laterality: Left;  . AMPUTATION Left 01/25/2018   Procedure: LEFT BELOW KNEE AMPUTATION;  Surgeon: [name], MD;  Location: [facility];  Service: Orthopedics;  Laterality: Left;  . AMPUTATION TOE Right 07/17/2019   Procedure: AMPUTATION RIGHT FOOT 2ND TOE;  Surgeon: Mcarthur Rossetti, MD;  Location: [facility];  Service: Orthopedics;  Laterality: Right;  . BELOW KNEE LEG AMPUTATION Left 01/25/2018  . CARDIAC CATHETERIZATION N/A 01/22/2016   Procedure: Left Heart Cath and Coronary Angiography;  Surgeon: Peter M Martinique, MD;  Location: [facility];  Service: Cardiovascular;  Laterality: N/A;  . FOOT AMPUTATION Bilateral   .  I & D EXTREMITY Left 12/15/2017   Procedure: IRRIGATION AND DEBRIDEMENT LEFT FOOT ULCER;  Surgeon: Mcarthur Rossetti, MD;  Location[facility];  Service: Orthopedics;  Laterality: Left;  . I & D EXTREMITY Left 07/25/2020   Procedure: LEFT BELOW KNEE AMPUTATION ABSCESS EXCISION AND SKIN GRAFT;  Surgeon: [name], MD;  Location: [facility];  Service: Orthopedics;  Laterality: Left;  . I & D EXTREMITY Left 08/22/2020   Procedure: DEBRIDEMENT LEFT BELOW KNEE AMPUTATION AND APPLY KERECIS SKIN GRAFT;  Surgeon: [name], MD;  Location: [facility];  Service: Orthopedics;  Laterality: Left;  . LITHOTRIPSY    . TENDON LENGTHENING Bilateral    calf  . TONSILLECTOMY     Social History   Occupational History  . Occupation: DISABLED  Tobacco Use  . Smoking status: Current Every Day Smoker    Packs/day: 1.00    Years: 40.00    Pack years: 40.00    Types: Cigarettes  . Smokeless tobacco: Never Used  Vaping Use  . Vaping Use: Never used  Substance and Sexual Activity  . Alcohol use: No  . Drug use: No  . Sexual activity: Not on file

## 2020-09-02 ENCOUNTER — Encounter: Payer: Self-pay | Admitting: Orthopedic Surgery

## 2020-09-02 ENCOUNTER — Ambulatory Visit (INDEPENDENT_AMBULATORY_CARE_PROVIDER_SITE_OTHER): Payer: Medicare HMO | Admitting: Orthopedic Surgery

## 2020-09-02 ENCOUNTER — Other Ambulatory Visit: Payer: Self-pay

## 2020-09-02 DIAGNOSIS — L97924 Non-pressure chronic ulcer of unspecified part of left lower leg with necrosis of bone: Secondary | ICD-10-CM

## 2020-09-02 NOTE — Progress Notes (Signed)
Office Visit Note   Patient: Troy [name]           Date of Birth: 1961/04/28           MRN: [id number] Visit Date: 09/02/2020              Requested by: [name], MD [address] [place],  [place] [address] PCP: [name], MD  Chief Complaint  Patient presents with  . Left Leg - Follow-up      HPI: Patient presents today for follow-up on his left leg.  He is 2 weeks status post debridement left below-knee amputation anterior tibia with application of kerecis graft.  He did not have his shrinker with him at the last visit it was in the car so a compression dressing was applied  Assessment & Plan: Visit Diagnoses: No diagnosis found.  Plan: Patient may begin using his shrinker.  If he has drainage she can apply a dressing over the top.  They are very apprehensive about having the shrinker directly on the skin.  We will see Dr. Sharol Given at that visit  Follow-Up Instructions: No follow-ups on file.   Ortho Exam  Patient is alert, oriented, no adenopathy, well-dressed, normal affect, normal respiratory effort. Examination surgical sutures intact.  No surrounding cellulitis no foul odor.  Minimal serous drainage mostly fibrinous tissue does have some superficial necrosis on central wound edges.  No ascending cellulitis  Imaging: No results found.   Labs: Lab Results  Component Value Date   HGBA1C 11.0 (H) 12/13/2017   REPTSTATUS 08/27/2020 FINAL 08/22/2020   GRAMSTAIN  08/22/2020    FEW WBC PRESENT,BOTH PMN AND MONONUCLEAR ABUNDANT GRAM POSITIVE COCCI IN PAIRS    CULT  08/22/2020    ABUNDANT GROUP B STREP(S.AGALACTIAE)ISOLATED FEW KLEBSIELLA OXYTOCA TESTING AGAINST S. AGALACTIAE NOT ROUTINELY PERFORMED DUE TO PREDICTABILITY OF AMP/PEN/VAN SUSCEPTIBILITY. NO ANAEROBES ISOLATED Performed at [facility], [place] [address], [place], [place] [address]    [place] OXYTOCA 08/22/2020     Lab Results  Component Value Date   ALBUMIN 3.5  08/22/2020   ALBUMIN 3.5 07/25/2020   ALBUMIN 4.0 10/26/2018    Lab Results  Component Value Date   MG 1.4 (L) 10/22/2018   MG 1.9 08/14/2008   MG 2.2 08/11/2008   No results found for: VD25OH  No results found for: PREALBUMIN CBC EXTENDED Latest Ref Rng & Units 08/22/2020 07/25/2020 07/13/2019  WBC 4.0 - 10.5 K/uL 13.1(H) 12.5(H) 15.5(H)  RBC 4.22 - 5.81 MIL/uL 5.28 5.32 5.58  HGB 13.0 - 17.0 g/dL 14.8 15.3 16.1  HCT 39.0 - 52.0 % 45.5 46.1 47.1  PLT 150 - 400 K/uL 210 243 213  NEUTROABS 1.7 - 7.7 K/uL - - -  LYMPHSABS 0.7 - 4.0 K/uL - - -     There is no height or weight on file to calculate BMI.  Orders:  No orders of the defined types were placed in this encounter.  No orders of the defined types were placed in this encounter.    Procedures: No procedures performed  Clinical Data: No additional findings.  ROS:  All other systems negative, except as noted in the HPI. Review of Systems  Objective: Vital Signs: There were no vitals taken for this visit.  Specialty Comments:  No specialty comments available.  PMFS History: Patient Active Problem List   Diagnosis Date Noted  . Abscess of leg without foot, left   . Osteomyelitis of second toe of right foot (  [place]) 07/12/2019  . Below knee amputation status, left 01/25/2018  . Wound dehiscence, surgical   . Foot amputation status, left 01/03/2018  . Status post left foot surgery 12/15/2017  . GERD (gastroesophageal reflux disease) 09/26/2017  . Hyperlipidemia associated with type 2 diabetes mellitus ([place]) 07/08/2017  . Snoring 06/07/2016  . Chest pain 01/22/2016  . Abnormal nuclear stress test 01/22/2016  . Pain, chronic due to trauma 07/04/2012  . Complex regional pain syndrome I of lower limb 07/04/2012  . COPD (chronic obstructive pulmonary disease) ([place]) 01/27/2011  . Pre-operative cardiovascular examination 01/27/2011  . Nonspecific abnormal electrocardiogram (ECG) (EKG) 01/27/2011  . Murmur 01/27/2011   . DM2 (diabetes mellitus, type 2) ([place])   . HTN (hypertension)   . HLD (hyperlipidemia)   . Tobacco abuse    Past Medical History:  Diagnosis Date  . Acute renal failure ([place])    in setting of NSAID use and orthopedic surgery 2010  . Anxiety and depression   . Chronic diastolic CHF (congestive heart failure) ([place])    a. Echo 6/17: severe conc LVH, vigorous EF, EF 65-70%, no dynamic obstruction, no RWMA, Gr 1 DD, mild TR  //  b. LHC 8/17: no sig CAD, LVEDP 28  . COPD (chronic obstructive pulmonary disease) ([place])   . Diabetic ulcer of left foot ([place])   . DM2 (diabetes mellitus, type 2) ([place])   . Dysrhythmia   . Family history of early CAD   . Fatty liver   . GERD (gastroesophageal reflux disease)   . History of amputation of foot ([place])    L trans-met // R toe  . History of cardiac catheterization    a. [place] 2002: irregs  //  b. LHC in 8/17: no sig CAD, apical DK, hyperdynamic LV, LVEDP 28  . History of kidney stones   . History of nuclear stress test    a. Nuc 7/17: Overall, intermediate risk nuclear stress test secondary to small size of apical lateral defect and reduced ejection fraction.  EF 43%  . HLD (hyperlipidemia)   . HTN (hypertension)   . Injuries     crushing injury to both his feet in February 2010.   Marland Kitchen Kidney calculi   . Palpitations   . PTSD (post-traumatic stress disorder)   . Tobacco abuse     Family History  Problem Relation Age of Onset  . Leukemia Mother 12       died  . Lung cancer Father 18       died  . Heart attack Brother 43  . Heart attack Brother 35  . Hypertension Brother        X3  . Hypertension Sister        X8  . Diabetes Sister   . Stroke Sister   . Diabetes Sister   . Other Brother        Musician accident    Past Surgical History:  Procedure Laterality Date  . AMPUTATION Left 01/03/2018   Procedure: LEFT MIDFOOT AMPUTATION/REVISION MIDAMPUTAION;  Surgeon: Mcarthur Rossetti, MD;  Location: [facility];  Service: Orthopedics;   Laterality: Left;  . AMPUTATION Left 01/25/2018   Procedure: LEFT BELOW KNEE AMPUTATION;  Surgeon: [name], MD;  Location: [facility];  Service: Orthopedics;  Laterality: Left;  . AMPUTATION TOE Right 07/17/2019   Procedure: AMPUTATION RIGHT FOOT 2ND TOE;  Surgeon: Mcarthur Rossetti, MD;  Location: [facility];  Service: Orthopedics;  Laterality: Right;  . BELOW KNEE LEG  AMPUTATION Left 01/25/2018  . CARDIAC CATHETERIZATION N/A 01/22/2016   Procedure: Left Heart Cath and Coronary Angiography;  Surgeon: Peter M Martinique, MD;  Location: [facility];  Service: Cardiovascular;  Laterality: N/A;  . FOOT AMPUTATION Bilateral   . I & D EXTREMITY Left 12/15/2017   Procedure: IRRIGATION AND DEBRIDEMENT LEFT FOOT ULCER;  Surgeon: Mcarthur Rossetti, MD;  [facility];  Service: Orthopedics;  Laterality: Left;  . I & D EXTREMITY Left 07/25/2020   Procedure: LEFT BELOW KNEE AMPUTATION ABSCESS EXCISION AND SKIN GRAFT;  Surgeon: [name], MD;  Location: [facility];  Service: Orthopedics;  Laterality: Left;  . I & D EXTREMITY Left 08/22/2020   Procedure: DEBRIDEMENT LEFT BELOW KNEE AMPUTATION AND APPLY KERECIS SKIN GRAFT;  Surgeon: [name], MD;  Location: [facility];  Service: Orthopedics;  Laterality: Left;  . LITHOTRIPSY    . TENDON LENGTHENING Bilateral    calf  . TONSILLECTOMY     Social History   Occupational History  . Occupation: DISABLED  Tobacco Use  . Smoking status: Current Every Day Smoker    Packs/day: 1.00    Years: 40.00    Pack years: 40.00    Types: Cigarettes  . Smokeless tobacco: Never Used  Vaping Use  . Vaping Use: Never used  Substance and Sexual Activity  . Alcohol use: No  . Drug use: No  . Sexual activity: Not on file

## 2020-09-04 ENCOUNTER — Ambulatory Visit (INDEPENDENT_AMBULATORY_CARE_PROVIDER_SITE_OTHER): Payer: Medicare HMO | Admitting: Orthopedic Surgery

## 2020-09-04 ENCOUNTER — Encounter: Payer: Self-pay | Admitting: Orthopedic Surgery

## 2020-09-04 ENCOUNTER — Other Ambulatory Visit: Payer: Self-pay

## 2020-09-04 DIAGNOSIS — L97924 Non-pressure chronic ulcer of unspecified part of left lower leg with necrosis of bone: Secondary | ICD-10-CM

## 2020-09-04 NOTE — Progress Notes (Signed)
Office Visit Note   Patient: Troy [name]           Date of Birth: 1960/11/05           MRN: [id number] Visit Date: 09/04/2020              Requested by: [name], MD [address] [place],  [place] [id number] PCP: [name], MD  Chief Complaint  Patient presents with  . Left Leg - Routine Post Op    09/01/20 left debridement BKA application Kerecis graft       HPI: Patient is a 60 year old gentleman who is 1 week status post debridement ulceration tibial crest with application of Kerecis skin graft.  Assessment & Plan: Visit Diagnoses:  1. 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  Follow-Up Instructions: Return in about 2 weeks (around 09/18/2020).   Ortho Exam  Patient is alert, oriented, no adenopathy, well-dressed, normal affect, normal respiratory effort. Examination the wound bed is flat the tissue is healthy there is no cellulitis no drainage there is some mild ischemic changes around the wound edges.  Patient is on Keflex and Cipro.  Imaging: No results found.     Labs: Lab Results  Component Value Date   HGBA1C 11.0 (H) 12/13/2017   REPTSTATUS 08/27/2020 FINAL 08/22/2020   GRAMSTAIN  08/22/2020    FEW WBC PRESENT,BOTH PMN AND MONONUCLEAR ABUNDANT GRAM POSITIVE COCCI IN PAIRS    CULT  08/22/2020    ABUNDANT GROUP B STREP(S.AGALACTIAE)ISOLATED FEW KLEBSIELLA OXYTOCA TESTING AGAINST S. AGALACTIAE NOT ROUTINELY PERFORMED DUE TO PREDICTABILITY OF AMP/PEN/VAN SUSCEPTIBILITY. NO ANAEROBES ISOLATED Performed at [facility], [place] [address] [place], [place] [address]    [place] OXYTOCA 08/22/2020     Lab Results  Component Value Date   ALBUMIN 3.5 08/22/2020   ALBUMIN 3.5 07/25/2020   ALBUMIN 4.0 10/26/2018    Lab Results  Component Value Date   MG 1.4 (L) 10/22/2018   MG 1.9  08/14/2008   MG 2.2 08/11/2008   No results found for: VD25OH  No results found for: PREALBUMIN CBC EXTENDED Latest Ref Rng & Units 08/22/2020 07/25/2020 07/13/2019  WBC 4.0 - 10.5 K/uL 13.1(H) 12.5(H) 15.5(H)  RBC 4.22 - 5.81 MIL/uL 5.28 5.32 5.58  HGB 13.0 - 17.0 g/dL 14.8 15.3 16.1  HCT 39.0 - 52.0 % 45.5 46.1 47.1  PLT 150 - 400 K/uL 210 243 213  NEUTROABS 1.7 - 7.7 K/uL - - -  LYMPHSABS 0.7 - 4.0 K/uL - - -     There is no height or weight on file to calculate BMI.  Orders:  No orders of the defined types were placed in this encounter.  No orders of the defined types were placed in this encounter.    Procedures: No procedures performed  Clinical Data: No additional findings.  ROS:  All other systems negative, except as noted in the HPI. Review of Systems  Objective: Vital Signs: There were no vitals taken for this visit.  Specialty Comments:  No specialty comments available.  PMFS History: Patient Active Problem List   Diagnosis Date Noted  . Abscess of leg without foot, left   . Osteomyelitis of second toe of right foot ([place]) 07/12/2019  . Below knee amputation status, left 01/25/2018  . Wound dehiscence, surgical   . Foot amputation status,  left 01/03/2018  . Status post left foot surgery 12/15/2017  . GERD (gastroesophageal reflux disease) 09/26/2017  . Hyperlipidemia associated with type 2 diabetes mellitus ([place]) 07/08/2017  . Snoring 06/07/2016  . Chest pain 01/22/2016  . Abnormal nuclear stress test 01/22/2016  . Pain, chronic due to trauma 07/04/2012  . Complex regional pain syndrome I of lower limb 07/04/2012  . COPD (chronic obstructive pulmonary disease) ([place]) 01/27/2011  . Pre-operative cardiovascular examination 01/27/2011  . Nonspecific abnormal electrocardiogram (ECG) (EKG) 01/27/2011  . Murmur 01/27/2011  . DM2 (diabetes mellitus, type 2) ([place])   . HTN (hypertension)   . HLD (hyperlipidemia)   . Tobacco abuse    Past Medical History:   Diagnosis Date  . Acute renal failure ([place])    in setting of NSAID use and orthopedic surgery 2010  . Anxiety and depression   . Chronic diastolic CHF (congestive heart failure) ([place])    a. Echo 6/17: severe conc LVH, vigorous EF, EF 65-70%, no dynamic obstruction, no RWMA, Gr 1 DD, mild TR  //  b. LHC 8/17: no sig CAD, LVEDP 28  . COPD (chronic obstructive pulmonary disease) ([place])   . Diabetic ulcer of left foot ([place])   . DM2 (diabetes mellitus, type 2) ([place])   . Dysrhythmia   . Family history of early CAD   . Fatty liver   . GERD (gastroesophageal reflux disease)   . History of amputation of foot ([place])    L trans-met // R toe  . History of cardiac catheterization    a. [place] 2002: irregs  //  b. LHC in 8/17: no sig CAD, apical DK, hyperdynamic LV, LVEDP 28  . History of kidney stones   . History of nuclear stress test    a. Nuc 7/17: Overall, intermediate risk nuclear stress test secondary to small size of apical lateral defect and reduced ejection fraction.  EF 43%  . HLD (hyperlipidemia)   . HTN (hypertension)   . Injuries     crushing injury to both his feet in February 2010.   Marland Kitchen Kidney calculi   . Palpitations   . PTSD (post-traumatic stress disorder)   . Tobacco abuse     Family History  Problem Relation Age of Onset  . Leukemia Mother 75       died  . Lung cancer Father 61       died  . Heart attack Brother 61  . Heart attack Brother 91  . Hypertension Brother        X3  . Hypertension Sister        X17  . Diabetes Sister   . Stroke Sister   . Diabetes Sister   . Other Brother        Musician accident    Past Surgical History:  Procedure Laterality Date  . AMPUTATION Left 01/03/2018   Procedure: LEFT MIDFOOT AMPUTATION/REVISION MIDAMPUTAION;  Surgeon: Mcarthur Rossetti, MD;  Location: [facility];  Service: Orthopedics;  Laterality: Left;  . AMPUTATION Left 01/25/2018   Procedure: LEFT BELOW KNEE AMPUTATION;  Surgeon: [name], MD;  Location: [facility];  Service:  Orthopedics;  Laterality: Left;  . AMPUTATION TOE Right 07/17/2019   Procedure: AMPUTATION RIGHT FOOT 2ND TOE;  Surgeon: Mcarthur Rossetti, MD;  Location: [facility];  Service: Orthopedics;  Laterality: Right;  . BELOW KNEE LEG AMPUTATION Left 01/25/2018  . CARDIAC CATHETERIZATION N/A 01/22/2016   Procedure: Left Heart Cath and Coronary Angiography;  Surgeon: Collier Salina  M Martinique, MD;  Location: [facility];  Service: Cardiovascular;  Laterality: N/A;  . FOOT AMPUTATION Bilateral   . I & D EXTREMITY Left 12/15/2017   Procedure: IRRIGATION AND DEBRIDEMENT LEFT FOOT ULCER;  Surgeon: Mcarthur Rossetti, MD;  [facility];  Service: Orthopedics;  Laterality: Left;  . I & D EXTREMITY Left 07/25/2020   Procedure: LEFT BELOW KNEE AMPUTATION ABSCESS EXCISION AND SKIN GRAFT;  Surgeon: [name], MD;  Location: [facility];  Service: Orthopedics;  Laterality: Left;  . I & D EXTREMITY Left 08/22/2020   Procedure: DEBRIDEMENT LEFT BELOW KNEE AMPUTATION AND APPLY KERECIS SKIN GRAFT;  Surgeon: [name], MD;  Location: [facility];  Service: Orthopedics;  Laterality: Left;  . LITHOTRIPSY    . TENDON LENGTHENING Bilateral    calf  . TONSILLECTOMY     Social History   Occupational History  . Occupation: DISABLED  Tobacco Use  . Smoking status: Current Every Day Smoker    Packs/day: 1.00    Years: 40.00    Pack years: 40.00    Types: Cigarettes  . Smokeless tobacco: Never Used  Vaping Use  . Vaping Use: Never used  Substance and Sexual Activity  . Alcohol use: No  . Drug use: No  . Sexual activity: Not on file

## 2020-09-18 ENCOUNTER — Encounter: Payer: Self-pay | Admitting: Orthopedic Surgery

## 2020-09-18 ENCOUNTER — Ambulatory Visit (INDEPENDENT_AMBULATORY_CARE_PROVIDER_SITE_OTHER): Payer: Medicare HMO | Admitting: Orthopedic Surgery

## 2020-09-18 DIAGNOSIS — L97924 Non-pressure chronic ulcer of unspecified part of left lower leg with necrosis of bone: Secondary | ICD-10-CM

## 2020-09-18 MED ORDER — CIPROFLOXACIN HCL 500 MG PO TABS: 500.0000 mg | ORAL_TABLET | Freq: Two times a day (BID) | ORAL | 0 refills | Status: DC

## 2020-09-18 NOTE — Progress Notes (Signed)
Office Visit Note   Patient: Troy [name]           Date of Birth: 02/24/1961           MRN: [id number] Visit Date: 09/18/2020              Requested by: [name], MD [address] [place],  [place] [id number] PCP: [name], MD  Chief Complaint  Patient presents with  . Left Leg - Routine Post Op    08/22/20 debridement left BKA Kerecis graft       HPI: Patient is a 60 year old gentleman presents status post skin graft left anterior tibia status post a left below-knee amputation is currently wearing the stump shrinker.  Assessment & Plan: Visit Diagnoses: No diagnosis found.  Plan: We will harvest the sutures today continue with the stump shrinker previous cultures were sensitive for Klebsiella he does have some redness around the sutures and we will call in a prescription for Cipro.  Bacteria was very sensitive to the Cipro.  Follow-Up Instructions: No follow-ups on file.   Ortho Exam  Patient is alert, oriented, no adenopathy, well-dressed, normal affect, normal respiratory effort. On examination the wound has good granulation tissue approximately 90% there is redness around the sutures possibly secondary to tension or infection.  We will start an antibiotic.  Harvest the sutures today.  The wound is flat and has good granulation tissue.  Imaging: No results found.    Labs: Lab Results  Component Value Date   HGBA1C 11.0 (H) 12/13/2017   REPTSTATUS 08/27/2020 FINAL 08/22/2020   GRAMSTAIN  08/22/2020    FEW WBC PRESENT,BOTH PMN AND MONONUCLEAR ABUNDANT GRAM POSITIVE COCCI IN PAIRS    CULT  08/22/2020    ABUNDANT GROUP B STREP(S.AGALACTIAE)ISOLATED FEW KLEBSIELLA OXYTOCA TESTING AGAINST S. AGALACTIAE NOT ROUTINELY PERFORMED DUE TO PREDICTABILITY OF AMP/PEN/VAN SUSCEPTIBILITY. NO ANAEROBES ISOLATED Performed at [facility], [place] [address], [place], [place] [address]    [place] OXYTOCA 08/22/2020     Lab Results  Component  Value Date   ALBUMIN 3.5 08/22/2020   ALBUMIN 3.5 07/25/2020   ALBUMIN 4.0 10/26/2018    Lab Results  Component Value Date   MG 1.4 (L) 10/22/2018   MG 1.9 08/14/2008   MG 2.2 08/11/2008   No results found for: VD25OH  No results found for: PREALBUMIN CBC EXTENDED Latest Ref Rng & Units 08/22/2020 07/25/2020 07/13/2019  WBC 4.0 - 10.5 K/uL 13.1(H) 12.5(H) 15.5(H)  RBC 4.22 - 5.81 MIL/uL 5.28 5.32 5.58  HGB 13.0 - 17.0 g/dL 14.8 15.3 16.1  HCT 39.0 - 52.0 % 45.5 46.1 47.1  PLT 150 - 400 K/uL 210 243 213  NEUTROABS 1.7 - 7.7 K/uL - - -  LYMPHSABS 0.7 - 4.0 K/uL - - -     There is no height or weight on file to calculate BMI.  Orders:  No orders of the defined types were placed in this encounter.  No orders of the defined types were placed in this encounter.    Procedures: No procedures performed  Clinical Data: No additional findings.  ROS:  All other systems negative, except as noted in the HPI. Review of Systems  Objective: Vital Signs: There were no vitals taken for this visit.  Specialty Comments:  No specialty comments available.  PMFS History: Patient Active Problem List   Diagnosis Date Noted  . Abscess of leg without foot, left   . Osteomyelitis of second toe of right foot (  [place]) 07/12/2019  . Below knee amputation status, left 01/25/2018  . Wound dehiscence, surgical   . Foot amputation status, left 01/03/2018  . Status post left foot surgery 12/15/2017  . GERD (gastroesophageal reflux disease) 09/26/2017  . Hyperlipidemia associated with type 2 diabetes mellitus ([place]) 07/08/2017  . Snoring 06/07/2016  . Chest pain 01/22/2016  . Abnormal nuclear stress test 01/22/2016  . Pain, chronic due to trauma 07/04/2012  . Complex regional pain syndrome I of lower limb 07/04/2012  . COPD (chronic obstructive pulmonary disease) ([place]) 01/27/2011  . Pre-operative cardiovascular examination 01/27/2011  . Nonspecific abnormal electrocardiogram (ECG) (EKG)  01/27/2011  . Murmur 01/27/2011  . DM2 (diabetes mellitus, type 2) ([place])   . HTN (hypertension)   . HLD (hyperlipidemia)   . Tobacco abuse    Past Medical History:  Diagnosis Date  . Acute renal failure ([place])    in setting of NSAID use and orthopedic surgery 2010  . Anxiety and depression   . Chronic diastolic CHF (congestive heart failure) ([place])    a. Echo 6/17: severe conc LVH, vigorous EF, EF 65-70%, no dynamic obstruction, no RWMA, Gr 1 DD, mild TR  //  b. LHC 8/17: no sig CAD, LVEDP 28  . COPD (chronic obstructive pulmonary disease) ([place])   . Diabetic ulcer of left foot ([place])   . DM2 (diabetes mellitus, type 2) ([place])   . Dysrhythmia   . Family history of early CAD   . Fatty liver   . GERD (gastroesophageal reflux disease)   . History of amputation of foot ([place])    L trans-met // R toe  . History of cardiac catheterization    a. [place] 2002: irregs  //  b. LHC in 8/17: no sig CAD, apical DK, hyperdynamic LV, LVEDP 28  . History of kidney stones   . History of nuclear stress test    a. Nuc 7/17: Overall, intermediate risk nuclear stress test secondary to small size of apical lateral defect and reduced ejection fraction.  EF 43%  . HLD (hyperlipidemia)   . HTN (hypertension)   . Injuries     crushing injury to both his feet in February 2010.   Marland Kitchen Kidney calculi   . Palpitations   . PTSD (post-traumatic stress disorder)   . Tobacco abuse     Family History  Problem Relation Age of Onset  . Leukemia Mother 46       died  . Lung cancer Father 79       died  . Heart attack Brother 48  . Heart attack Brother 33  . Hypertension Brother        X3  . Hypertension Sister        X60  . Diabetes Sister   . Stroke Sister   . Diabetes Sister   . Other Brother        Musician accident    Past Surgical History:  Procedure Laterality Date  . AMPUTATION Left 01/03/2018   Procedure: LEFT MIDFOOT AMPUTATION/REVISION MIDAMPUTAION;  Surgeon: Mcarthur Rossetti, MD;  Location: [facility];   Service: Orthopedics;  Laterality: Left;  . AMPUTATION Left 01/25/2018   Procedure: LEFT BELOW KNEE AMPUTATION;  Surgeon: [name], MD;  Location: [facility];  Service: Orthopedics;  Laterality: Left;  . AMPUTATION TOE Right 07/17/2019   Procedure: AMPUTATION RIGHT FOOT 2ND TOE;  Surgeon: Mcarthur Rossetti, MD;  Location: [facility];  Service: Orthopedics;  Laterality: Right;  . BELOW KNEE LEG  AMPUTATION Left 01/25/2018  . CARDIAC CATHETERIZATION N/A 01/22/2016   Procedure: Left Heart Cath and Coronary Angiography;  Surgeon: Peter M Martinique, MD;  Location: [facility];  Service: Cardiovascular;  Laterality: N/A;  . FOOT AMPUTATION Bilateral   . I & D EXTREMITY Left 12/15/2017   Procedure: IRRIGATION AND DEBRIDEMENT LEFT FOOT ULCER;  Surgeon: Mcarthur Rossetti, MD;  [facility];  Service: Orthopedics;  Laterality: Left;  . I & D EXTREMITY Left 07/25/2020   Procedure: LEFT BELOW KNEE AMPUTATION ABSCESS EXCISION AND SKIN GRAFT;  Surgeon: [name], MD;  Location: [facility];  Service: Orthopedics;  Laterality: Left;  . I & D EXTREMITY Left 08/22/2020   Procedure: DEBRIDEMENT LEFT BELOW KNEE AMPUTATION AND APPLY KERECIS SKIN GRAFT;  Surgeon: [name], MD;  Location: [facility];  Service: Orthopedics;  Laterality: Left;  . LITHOTRIPSY    . TENDON LENGTHENING Bilateral    calf  . TONSILLECTOMY     Social History   Occupational History  . Occupation: DISABLED  Tobacco Use  . Smoking status: Current Every Day Smoker    Packs/day: 1.00    Years: 40.00    Pack years: 40.00    Types: Cigarettes  . Smokeless tobacco: Never Used  Vaping Use  . Vaping Use: Never used  Substance and Sexual Activity  . Alcohol use: No  . Drug use: No  . Sexual activity: Not on file

## 2020-10-02 ENCOUNTER — Ambulatory Visit (INDEPENDENT_AMBULATORY_CARE_PROVIDER_SITE_OTHER): Payer: Medicare HMO | Admitting: Orthopedic Surgery

## 2020-10-02 ENCOUNTER — Encounter: Payer: Self-pay | Admitting: Orthopedic Surgery

## 2020-10-02 VITALS — Ht 72.0 in | Wt 190.0 lb

## 2020-10-02 DIAGNOSIS — L97924 Non-pressure chronic ulcer of unspecified part of left lower leg with necrosis of bone: Secondary | ICD-10-CM

## 2020-10-02 DIAGNOSIS — S88112A Complete traumatic amputation at level between knee and ankle, left lower leg, initial encounter: Secondary | ICD-10-CM

## 2020-10-02 DIAGNOSIS — Z89512 Acquired absence of left leg below knee: Secondary | ICD-10-CM

## 2020-10-02 NOTE — Progress Notes (Signed)
Office Visit Note   Patient: Troy [name]           Date of Birth: [birth year]           MRN: [id number] Visit Date: 10/02/2020              Requested by: [name], MD [address] [place],  [place] [address] PCP: [name], MD  Chief Complaint  Patient presents with  . Left Leg - Follow-up    Left below knee amputation 08/22/2020      HPI: Patient presents 5 weeks status post debridement of ulcer anterior tibia status post transtibial amputation.  Patient is currently wearing a stump shrinker status post skin graft.  Assessment & Plan: Visit Diagnoses:  1. Skin ulcer of left lower leg with necrosis of bone ([place])   2. Below-knee amputation of left lower extremity ([place])     Plan: Recommended continue with compression stocking discussed the importance of washing with soap and water to remove the fibrinous exudative tissue.  Again discussed the importance of protein supplement to be used twice a day with 30 g of protein for the morning and afternoon snack.  Follow-Up Instructions: Return in about 4 weeks (around 10/30/2020).   Ortho Exam  Patient is alert, oriented, no adenopathy, well-dressed, normal affect, normal respiratory effort. Examination the wound bed has a thin layer of fibrinous exudative tissue this was gently debrided with a 4 x 4 gauze approximately 75% of the wound has healthy granulation tissue there is a small amount of fibrinous tissue in the mid aspect of the wound but this does debride and is not exposed bone or tendon.  The wound is approximately 5 x 5 cm.  Imaging: No results found.    Labs: Lab Results  Component Value Date   HGBA1C 11.0 (H) 12/13/2017   REPTSTATUS 08/27/2020 FINAL 08/22/2020   GRAMSTAIN  08/22/2020    FEW WBC PRESENT,BOTH PMN AND MONONUCLEAR ABUNDANT GRAM POSITIVE COCCI IN PAIRS    CULT  08/22/2020    ABUNDANT GROUP B STREP(S.AGALACTIAE)ISOLATED FEW KLEBSIELLA OXYTOCA TESTING AGAINST S. AGALACTIAE NOT ROUTINELY  PERFORMED DUE TO PREDICTABILITY OF AMP/PEN/VAN SUSCEPTIBILITY. NO ANAEROBES ISOLATED Performed at [facility], [place] [address] [place], [place] [address]    [place] OXYTOCA 08/22/2020     Lab Results  Component Value Date   ALBUMIN 3.5 08/22/2020   ALBUMIN 3.5 07/25/2020   ALBUMIN 4.0 10/26/2018    Lab Results  Component Value Date   MG 1.4 (L) 10/22/2018   MG 1.9 08/14/2008   MG 2.2 08/11/2008   No results found for: VD25OH  No results found for: PREALBUMIN CBC EXTENDED Latest Ref Rng & Units 08/22/2020 07/25/2020 07/13/2019  WBC 4.0 - 10.5 K/uL 13.1(H) 12.5(H) 15.5(H)  RBC 4.22 - 5.81 MIL/uL 5.28 5.32 5.58  HGB 13.0 - 17.0 g/dL 14.8 15.3 16.1  HCT 39.0 - 52.0 % 45.5 46.1 47.1  PLT 150 - 400 K/uL 210 243 213  NEUTROABS 1.7 - 7.7 K/uL - - -  LYMPHSABS 0.7 - 4.0 K/uL - - -     Body mass index is 25.77 kg/m.  Orders:  No orders of the defined types were placed in this encounter.  No orders of the defined types were placed in this encounter.    Procedures: No procedures performed  Clinical Data: No additional findings.  ROS:  All other systems negative, except as noted in the HPI. Review of Systems  Objective: Vital Signs: Ht  6' (1.829 m)   Wt 190 lb (86.2 kg)   BMI 25.77 kg/m   Specialty Comments:  No specialty comments available.  PMFS History: Patient Active Problem List   Diagnosis Date Noted  . Abscess of leg without foot, left   . Osteomyelitis of second toe of right foot ([place]) 07/12/2019  . Below knee amputation status, left 01/25/2018  . Wound dehiscence, surgical   . Foot amputation status, left 01/03/2018  . Status post left foot surgery 12/15/2017  . GERD (gastroesophageal reflux disease) 09/26/2017  . Hyperlipidemia associated with type 2 diabetes mellitus ([place]) 07/08/2017  . Snoring 06/07/2016  . Chest pain 01/22/2016  . Abnormal nuclear stress test 01/22/2016  . Pain, chronic due to trauma 07/04/2012  . Complex  regional pain syndrome I of lower limb 07/04/2012  . COPD (chronic obstructive pulmonary disease) ([place]) 01/27/2011  . Pre-operative cardiovascular examination 01/27/2011  . Nonspecific abnormal electrocardiogram (ECG) (EKG) 01/27/2011  . Murmur 01/27/2011  . DM2 (diabetes mellitus, type 2) ([place])   . HTN (hypertension)   . HLD (hyperlipidemia)   . Tobacco abuse    Past Medical History:  Diagnosis Date  . Acute renal failure ([place])    in setting of NSAID use and orthopedic surgery 2010  . Anxiety and depression   . Chronic diastolic CHF (congestive heart failure) ([place])    a. Echo 6/17: severe conc LVH, vigorous EF, EF 65-70%, no dynamic obstruction, no RWMA, Gr 1 DD, mild TR  //  b. LHC 8/17: no sig CAD, LVEDP 28  . COPD (chronic obstructive pulmonary disease) ([place])   . Diabetic ulcer of left foot ([place])   . DM2 (diabetes mellitus, type 2) ([place])   . Dysrhythmia   . Family history of early CAD   . Fatty liver   . GERD (gastroesophageal reflux disease)   . History of amputation of foot ([place])    L trans-met // R toe  . History of cardiac catheterization    a. [place] 2002: irregs  //  b. LHC in 8/17: no sig CAD, apical DK, hyperdynamic LV, LVEDP 28  . History of kidney stones   . History of nuclear stress test    a. Nuc 7/17: Overall, intermediate risk nuclear stress test secondary to small size of apical lateral defect and reduced ejection fraction.  EF 43%  . HLD (hyperlipidemia)   . HTN (hypertension)   . Injuries     crushing injury to both his feet in February 2010.   Marland Kitchen Kidney calculi   . Palpitations   . PTSD (post-traumatic stress disorder)   . Tobacco abuse     Family History  Problem Relation Age of Onset  . Leukemia Mother 69       died  . Lung cancer Father 48       died  . Heart attack Brother 57  . Heart attack Brother 14  . Hypertension Brother        X3  . Hypertension Sister        X13  . Diabetes Sister   . Stroke Sister   . Diabetes Sister   . Other  Brother        Musician accident    Past Surgical History:  Procedure Laterality Date  . AMPUTATION Left 01/03/2018   Procedure: LEFT MIDFOOT AMPUTATION/REVISION MIDAMPUTAION;  Surgeon: Mcarthur Rossetti, MD;  Location: [facility];  Service: Orthopedics;  Laterality: Left;  . AMPUTATION Left 01/25/2018   Procedure: LEFT BELOW KNEE  AMPUTATION;  Surgeon: [name], MD;  Location: [facility];  Service: Orthopedics;  Laterality: Left;  . AMPUTATION TOE Right 07/17/2019   Procedure: AMPUTATION RIGHT FOOT 2ND TOE;  Surgeon: Mcarthur Rossetti, MD;  Location: [facility];  Service: Orthopedics;  Laterality: Right;  . BELOW KNEE LEG AMPUTATION Left 01/25/2018  . CARDIAC CATHETERIZATION N/A 01/22/2016   Procedure: Left Heart Cath and Coronary Angiography;  Surgeon: Peter M Martinique, MD;  Location: [facility];  Service: Cardiovascular;  Laterality: N/A;  . FOOT AMPUTATION Bilateral   . I & D EXTREMITY Left 12/15/2017   Procedure: IRRIGATION AND DEBRIDEMENT LEFT FOOT ULCER;  Surgeon: Mcarthur Rossetti, MD;  [facility];  Service: Orthopedics;  Laterality: Left;  . I & D EXTREMITY Left 07/25/2020   Procedure: LEFT BELOW KNEE AMPUTATION ABSCESS EXCISION AND SKIN GRAFT;  Surgeon: [name], MD;  Location: [facility];  Service: Orthopedics;  Laterality: Left;  . I & D EXTREMITY Left 08/22/2020   Procedure: DEBRIDEMENT LEFT BELOW KNEE AMPUTATION AND APPLY KERECIS SKIN GRAFT;  Surgeon: [name], MD;  Location: [facility];  Service: Orthopedics;  Laterality: Left;  . LITHOTRIPSY    . TENDON LENGTHENING Bilateral    calf  . TONSILLECTOMY     Social History   Occupational History  . Occupation: DISABLED  Tobacco Use  . Smoking status: Current Every Day Smoker    Packs/day: 1.00    Years: 40.00    Pack years: 40.00    Types: Cigarettes  . Smokeless tobacco: Never Used  Vaping Use  . Vaping Use: Never used  Substance and Sexual Activity  . Alcohol use: No  . Drug use: No  .  Sexual activity: Not on file

## 2020-10-30 ENCOUNTER — Ambulatory Visit (INDEPENDENT_AMBULATORY_CARE_PROVIDER_SITE_OTHER): Payer: Medicare HMO | Admitting: Orthopedic Surgery

## 2020-10-30 DIAGNOSIS — L97514 Non-pressure chronic ulcer of other part of right foot with necrosis of bone: Secondary | ICD-10-CM | POA: Diagnosis not present

## 2020-10-31 ENCOUNTER — Encounter: Payer: Self-pay | Admitting: Orthopedic Surgery

## 2020-10-31 NOTE — Progress Notes (Signed)
Office Visit Note   Patient: Troy [name]           Date of Birth: 10-Sep-1960           MRN: [id number] Visit Date: 10/30/2020              Requested by: [name], MD [address] [place],  [place] [address] PCP: [name], MD  Chief Complaint  Patient presents with  . Left Leg - Follow-up      HPI: Patient is a 60 year old gentleman who presents for 2 separate issues.  #1 he is status post a left below the knee amputation he is about 4 weeks out.  #2 he is status post Kerecis graft right foot with drainage from his ulcer.  Assessment & Plan: Visit Diagnoses:  1. Ulcer of right foot with necrosis of bone ([place])     Plan: Patient will continue with the prosthetic shrinker for the right foot.  Continue with daily wound care.  Follow-Up Instructions: Return in about 3 weeks (around 11/20/2020).   Ortho Exam  Patient is alert, oriented, no adenopathy, well-dressed, normal affect, normal respiratory effort. Examination patient is status post right great toe amputation with a Wagner grade 1 ulcer beneath the first metatarsal head.  After informed consent a 10 blade knife was used to debride the skin and soft tissue back to healthy viable tissue.  The wound was 1 cm prior to debridement 3 cm in diameter after debridement and 5 mm deep there is good healthy granulation tissue no exposed bone or tendon no tunneling.  A dry dressing was applied.  Imaging: No results found. No images are attached to the encounter.  Labs: Lab Results  Component Value Date   HGBA1C 11.0 (H) 12/13/2017   REPTSTATUS 08/27/2020 FINAL 08/22/2020   GRAMSTAIN  08/22/2020    FEW WBC PRESENT,BOTH PMN AND MONONUCLEAR ABUNDANT GRAM POSITIVE COCCI IN PAIRS    CULT  08/22/2020    ABUNDANT GROUP B STREP(S.AGALACTIAE)ISOLATED FEW KLEBSIELLA OXYTOCA TESTING AGAINST S. AGALACTIAE NOT ROUTINELY PERFORMED DUE TO PREDICTABILITY OF AMP/PEN/VAN SUSCEPTIBILITY. NO ANAEROBES ISOLATED Performed at  [facility], [place] [address], [place], [place] [address]    [place] OXYTOCA 08/22/2020     Lab Results  Component Value Date   ALBUMIN 3.5 08/22/2020   ALBUMIN 3.5 07/25/2020   ALBUMIN 4.0 10/26/2018    Lab Results  Component Value Date   MG 1.4 (L) 10/22/2018   MG 1.9 08/14/2008   MG 2.2 08/11/2008   No results found for: VD25OH  No results found for: PREALBUMIN CBC EXTENDED Latest Ref Rng & Units 08/22/2020 07/25/2020 07/13/2019  WBC 4.0 - 10.5 K/uL 13.1(H) 12.5(H) 15.5(H)  RBC 4.22 - 5.81 MIL/uL 5.28 5.32 5.58  HGB 13.0 - 17.0 g/dL 14.8 15.3 16.1  HCT 39.0 - 52.0 % 45.5 46.1 47.1  PLT 150 - 400 K/uL 210 243 213  NEUTROABS 1.7 - 7.7 K/uL - - -  LYMPHSABS 0.7 - 4.0 K/uL - - -     There is no height or weight on file to calculate BMI.  Orders:  No orders of the defined types were placed in this encounter.  No orders of the defined types were placed in this encounter.    Procedures: No procedures performed  Clinical Data: No additional findings.  ROS:  All other systems negative, except as noted in the HPI. Review of Systems  Objective: Vital Signs: There were no vitals taken for this visit.  Specialty Comments:  No specialty comments available.  PMFS History: Patient Active Problem List   Diagnosis Date Noted  . Abscess of leg without foot, left   . Osteomyelitis of second toe of right foot ([place]) 07/12/2019  . Below knee amputation status, left 01/25/2018  . Wound dehiscence, surgical   . Foot amputation status, left 01/03/2018  . Status post left foot surgery 12/15/2017  . GERD (gastroesophageal reflux disease) 09/26/2017  . Hyperlipidemia associated with type 2 diabetes mellitus ([place]) 07/08/2017  . Snoring 06/07/2016  . Chest pain 01/22/2016  . Abnormal nuclear stress test 01/22/2016  . Pain, chronic due to trauma 07/04/2012  . Complex regional pain syndrome I of lower limb 07/04/2012  . COPD (chronic obstructive pulmonary  disease) ([place]) 01/27/2011  . Pre-operative cardiovascular examination 01/27/2011  . Nonspecific abnormal electrocardiogram (ECG) (EKG) 01/27/2011  . Murmur 01/27/2011  . DM2 (diabetes mellitus, type 2) ([place])   . HTN (hypertension)   . HLD (hyperlipidemia)   . Tobacco abuse    Past Medical History:  Diagnosis Date  . Acute renal failure ([place])    in setting of NSAID use and orthopedic surgery 2010  . Anxiety and depression   . Chronic diastolic CHF (congestive heart failure) ([place])    a. Echo 6/17: severe conc LVH, vigorous EF, EF 65-70%, no dynamic obstruction, no RWMA, Gr 1 DD, mild TR  //  b. LHC 8/17: no sig CAD, LVEDP 28  . COPD (chronic obstructive pulmonary disease) ([place])   . Diabetic ulcer of left foot ([place])   . DM2 (diabetes mellitus, type 2) ([place])   . Dysrhythmia   . Family history of early CAD   . Fatty liver   . GERD (gastroesophageal reflux disease)   . History of amputation of foot ([place])    L trans-met // R toe  . History of cardiac catheterization    a. [place] 2002: irregs  //  b. LHC in 8/17: no sig CAD, apical DK, hyperdynamic LV, LVEDP 28  . History of kidney stones   . History of nuclear stress test    a. Nuc 7/17: Overall, intermediate risk nuclear stress test secondary to small size of apical lateral defect and reduced ejection fraction.  EF 43%  . HLD (hyperlipidemia)   . HTN (hypertension)   . Injuries     crushing injury to both his feet in February 2010.   Marland Kitchen Kidney calculi   . Palpitations   . PTSD (post-traumatic stress disorder)   . Tobacco abuse     Family History  Problem Relation Age of Onset  . Leukemia Mother 24       died  . Lung cancer Father 53       died  . Heart attack Brother 36  . Heart attack Brother 6  . Hypertension Brother        X3  . Hypertension Sister        X59  . Diabetes Sister   . Stroke Sister   . Diabetes Sister   . Other Brother        Musician accident    Past Surgical History:  Procedure Laterality Date  .  AMPUTATION Left 01/03/2018   Procedure: LEFT MIDFOOT AMPUTATION/REVISION MIDAMPUTAION;  Surgeon: Mcarthur Rossetti, MD;  Location: [facility];  Service: Orthopedics;  Laterality: Left;  . AMPUTATION Left 01/25/2018   Procedure: LEFT BELOW KNEE AMPUTATION;  Surgeon: [name], MD;  Location: [facility];  Service: Orthopedics;  Laterality: Left;  .  AMPUTATION TOE Right 07/17/2019   Procedure: AMPUTATION RIGHT FOOT 2ND TOE;  Surgeon: Mcarthur Rossetti, MD;  Location: [facility];  Service: Orthopedics;  Laterality: Right;  . BELOW KNEE LEG AMPUTATION Left 01/25/2018  . CARDIAC CATHETERIZATION N/A 01/22/2016   Procedure: Left Heart Cath and Coronary Angiography;  Surgeon: Peter M Martinique, MD;  Location: [facility];  Service: Cardiovascular;  Laterality: N/A;  . FOOT AMPUTATION Bilateral   . I & D EXTREMITY Left 12/15/2017   Procedure: IRRIGATION AND DEBRIDEMENT LEFT FOOT ULCER;  Surgeon: Mcarthur Rossetti, MD;  [facility];  Service: Orthopedics;  Laterality: Left;  . I & D EXTREMITY Left 07/25/2020   Procedure: LEFT BELOW KNEE AMPUTATION ABSCESS EXCISION AND SKIN GRAFT;  Surgeon: [name], MD;  Location: [facility];  Service: Orthopedics;  Laterality: Left;  . I & D EXTREMITY Left 08/22/2020   Procedure: DEBRIDEMENT LEFT BELOW KNEE AMPUTATION AND APPLY KERECIS SKIN GRAFT;  Surgeon: [name], MD;  Location: [facility];  Service: Orthopedics;  Laterality: Left;  . LITHOTRIPSY    . TENDON LENGTHENING Bilateral    calf  . TONSILLECTOMY     Social History   Occupational History  . Occupation: DISABLED  Tobacco Use  . Smoking status: Current Every Day Smoker    Packs/day: 1.00    Years: 40.00    Pack years: 40.00    Types: Cigarettes  . Smokeless tobacco: Never Used  Vaping Use  . Vaping Use: Never used  Substance and Sexual Activity  . Alcohol use: No  . Drug use: No  . Sexual activity: Not on file

## 2020-11-20 ENCOUNTER — Ambulatory Visit (INDEPENDENT_AMBULATORY_CARE_PROVIDER_SITE_OTHER): Payer: Medicare HMO | Admitting: Orthopedic Surgery

## 2020-11-20 DIAGNOSIS — S88112A Complete traumatic amputation at level between knee and ankle, left lower leg, initial encounter: Secondary | ICD-10-CM

## 2020-11-20 DIAGNOSIS — L97924 Non-pressure chronic ulcer of unspecified part of left lower leg with necrosis of bone: Secondary | ICD-10-CM

## 2020-11-30 ENCOUNTER — Encounter: Payer: Self-pay | Admitting: Orthopedic Surgery

## 2020-11-30 NOTE — Progress Notes (Signed)
Office Visit Note   Patient: Troy [name]           Date of Birth: [birth year]           MRN: [id number] Visit Date: 11/20/2020              Requested by: [name], [place],  [place] [address] PCP: [name], MD  Chief Complaint  Patient presents with   Left Leg - Routine Post Op    08/22/20 left BKA debridement  kerecis graft      HPI: Patient is a 60 year old gentleman who is seen in follow-up status post debridement and skin graft for an ulcer over the tibial tubercle left transtibial amputation.  Patient is continuing to wear the shrinker states that he still has some drainage patient is 3 months status post skin graft.  Assessment & Plan: Visit Diagnoses:  1. Below-knee amputation of left lower extremity ([place])   2. 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  Patient states he would like to continue with wound care at this time continue with the stump shrinker.  Follow-Up Instructions: Return in about 4 weeks (around 12/18/2020).   Ortho Exam  Patient is alert, oriented, no adenopathy, well-dressed, normal affect, normal respiratory effort. Examination the wound bed has healthy granulation tissue it is flat 50 x 35 mm.  There is superficial epithelialization around the wound edges there is no cellulitis no drainage.  Imaging: No results found. No images are attached to the encounter.  Labs: Lab Results  Component Value Date   HGBA1C 11.0 (H) 12/13/2017   REPTSTATUS 08/27/2020 FINAL 08/22/2020   GRAMSTAIN  08/22/2020    FEW WBC PRESENT,BOTH PMN AND MONONUCLEAR ABUNDANT GRAM POSITIVE COCCI IN PAIRS    CULT  08/22/2020    ABUNDANT GROUP B STREP(S.AGALACTIAE)ISOLATED FEW KLEBSIELLA OXYTOCA TESTING AGAINST S. AGALACTIAE NOT ROUTINELY PERFORMED DUE TO PREDICTABILITY OF AMP/PEN/VAN  SUSCEPTIBILITY. NO ANAEROBES ISOLATED Performed at [facility], [place] [address] [place], [place] [address]    [place] OXYTOCA 08/22/2020     Lab Results  Component Value Date   ALBUMIN 3.5 08/22/2020   ALBUMIN 3.5 07/25/2020   ALBUMIN 4.0 10/26/2018    Lab Results  Component Value Date   MG 1.4 (L) 10/22/2018   MG 1.9 08/14/2008   MG 2.2 08/11/2008   No results found for: VD25OH  No results found for: PREALBUMIN CBC EXTENDED Latest Ref Rng & Units 08/22/2020 07/25/2020 07/13/2019  WBC 4.0 - 10.5 K/uL 13.1(H) 12.5(H) 15.5(H)  RBC 4.22 - 5.81 MIL/uL 5.28 5.32 5.58  HGB 13.0 - 17.0 g/dL 14.8 15.3 16.1  HCT 39.0 - 52.0 % 45.5 46.1 47.1  PLT 150 - 400 K/uL 210 243 213  NEUTROABS 1.7 - 7.7 K/uL - - -  LYMPHSABS 0.7 - 4.0 K/uL - - -     There is no height or weight on file to calculate BMI.  Orders:  No orders of the defined types were placed in this encounter.  No orders of the defined types were placed in this encounter.    Procedures: No procedures performed  Clinical Data: No additional findings.  ROS:  All other systems negative, except as noted in the HPI. Review of Systems  Objective: Vital Signs: There were no vitals taken for this visit.  Specialty  Comments:  No specialty comments available.  PMFS History: Patient Active Problem List   Diagnosis Date Noted   Abscess of leg without foot, left    Osteomyelitis of second toe of right foot ([place]) 07/12/2019   Below knee amputation status, left 01/25/2018   Wound dehiscence, surgical    Foot amputation status, left 01/03/2018   Status post left foot surgery 12/15/2017   GERD (gastroesophageal reflux disease) 09/26/2017   Hyperlipidemia associated with type 2 diabetes mellitus ([place]) 07/08/2017   Snoring 06/07/2016   Chest pain 01/22/2016   Abnormal nuclear stress test 01/22/2016   Pain, chronic due to trauma 07/04/2012   Complex regional pain syndrome I of lower limb 07/04/2012    COPD (chronic obstructive pulmonary disease) ([place]) 01/27/2011   Pre-operative cardiovascular examination 01/27/2011   Nonspecific abnormal electrocardiogram (ECG) (EKG) 01/27/2011   Murmur 01/27/2011   DM2 (diabetes mellitus, type 2) ([place])    HTN (hypertension)    HLD (hyperlipidemia)    Tobacco abuse    Past Medical History:  Diagnosis Date   Acute renal failure ([place])    in setting of NSAID use and orthopedic surgery 2010   Anxiety and depression    Chronic diastolic CHF (congestive heart failure) ([place])    a. Echo 6/17: severe conc LVH, vigorous EF, EF 65-70%, no dynamic obstruction, no RWMA, Gr 1 DD, mild TR  //  b. LHC 8/17: no sig CAD, LVEDP 28   COPD (chronic obstructive pulmonary disease) (HCC)    Diabetic ulcer of left foot (HCC)    DM2 (diabetes mellitus, type 2) ([place])    Dysrhythmia    Family history of early CAD    Fatty liver    GERD (gastroesophageal reflux disease)    History of amputation of foot (HCC)    L trans-met // R toe   History of cardiac catheterization    a. [place] 2002: irregs  //  b. LHC in 8/17: no sig CAD, apical DK, hyperdynamic LV, LVEDP 28   History of kidney stones    History of nuclear stress test    a. Nuc 7/17: Overall, intermediate risk nuclear stress test secondary to small size of apical lateral defect and reduced ejection fraction.  EF 43%   HLD (hyperlipidemia)    HTN (hypertension)    Injuries     crushing injury to both his feet in February 2010.    Kidney calculi    Palpitations    PTSD (post-traumatic stress disorder)    Tobacco abuse     Family History  Problem Relation Age of Onset   Leukemia Mother 23       died   Lung cancer Father 35       died   Heart attack Brother 66   Heart attack Brother 68   Hypertension Brother        X3   Hypertension Sister        X2   Diabetes Sister    Stroke Sister    Diabetes Sister    Other Brother        Musician accident    Past Surgical History:  Procedure Laterality Date   AMPUTATION  Left 01/03/2018   Procedure: LEFT MIDFOOT AMPUTATION/REVISION MIDAMPUTAION;  Surgeon: Mcarthur Rossetti, MD;  Location: [facility];  Service: Orthopedics;  Laterality: Left;   AMPUTATION Left 01/25/2018   Procedure: LEFT BELOW KNEE AMPUTATION;  Surgeon: [name], MD;  Location: [facility];  Service: Orthopedics;  Laterality: Left;  AMPUTATION TOE Right 07/17/2019   Procedure: AMPUTATION RIGHT FOOT 2ND TOE;  Surgeon: Mcarthur Rossetti, MD;  Location: [facility];  Service: Orthopedics;  Laterality: Right;   BELOW KNEE LEG AMPUTATION Left 01/25/2018   CARDIAC CATHETERIZATION N/A 01/22/2016   Procedure: Left Heart Cath and Coronary Angiography;  Surgeon: Peter M Martinique, MD;  Location: [facility];  Service: Cardiovascular;  Laterality: N/A;   FOOT AMPUTATION Bilateral    I & D EXTREMITY Left 12/15/2017   Procedure: IRRIGATION AND DEBRIDEMENT LEFT FOOT ULCER;  Surgeon: Mcarthur Rossetti, MD;  [facility];  Service: Orthopedics;  Laterality: Left;   I & D EXTREMITY Left 07/25/2020   Procedure: LEFT BELOW KNEE AMPUTATION ABSCESS EXCISION AND SKIN GRAFT;  Surgeon: [name], MD;  Location: [facility];  Service: Orthopedics;  Laterality: Left;   I & D EXTREMITY Left 08/22/2020   Procedure: DEBRIDEMENT LEFT BELOW KNEE AMPUTATION AND APPLY KERECIS SKIN GRAFT;  Surgeon: [name], MD;  Location: [facility];  Service: Orthopedics;  Laterality: Left;   LITHOTRIPSY     TENDON LENGTHENING Bilateral    calf   TONSILLECTOMY     Social History   Occupational History   Occupation: DISABLED  Tobacco Use   Smoking status: Every Day    Packs/day: 1.00    Years: 40.00    Pack years: 40.00    Types: Cigarettes   Smokeless tobacco: Never  Vaping Use   Vaping Use: Never used  Substance and Sexual Activity   Alcohol use: No   Drug use: No   Sexual activity: Not on file

## 2020-12-18 ENCOUNTER — Other Ambulatory Visit: Payer: Self-pay

## 2020-12-18 ENCOUNTER — Ambulatory Visit: Payer: Medicare HMO | Admitting: Orthopedic Surgery

## 2020-12-18 ENCOUNTER — Encounter: Payer: Self-pay | Admitting: Orthopedic Surgery

## 2020-12-18 DIAGNOSIS — Z89411 Acquired absence of right great toe: Secondary | ICD-10-CM

## 2020-12-18 DIAGNOSIS — Z89512 Acquired absence of left leg below knee: Secondary | ICD-10-CM

## 2020-12-18 DIAGNOSIS — S88112A Complete traumatic amputation at level between knee and ankle, left lower leg, initial encounter: Secondary | ICD-10-CM

## 2020-12-18 DIAGNOSIS — L97512 Non-pressure chronic ulcer of other part of right foot with fat layer exposed: Secondary | ICD-10-CM

## 2020-12-18 DIAGNOSIS — S98111A Complete traumatic amputation of right great toe, initial encounter: Secondary | ICD-10-CM

## 2020-12-18 DIAGNOSIS — L97924 Non-pressure chronic ulcer of unspecified part of left lower leg with necrosis of bone: Secondary | ICD-10-CM

## 2020-12-18 NOTE — Progress Notes (Signed)
Office Visit Note   Patient: Troy [name]           Date of Birth: 07/14/1960           MRN: [id number] Visit Date: 12/18/2020              Requested by: [name], MD [address] [place],  [place] [address] PCP: [name], MD  Chief Complaint  Patient presents with   Left Leg - Follow-up    08/22/20 left BKA debridement Kerecis graft       HPI: Patient is a 60 year old gentleman who presents for 2 separate issues.  #1 he is status post debridement and skin graft for an ulcer over the tibial tubercle left transtibial amputation.  Patient received a Kerecis graft.  He is currently wearing a stump shrinker.  Patient also has a large Wagner grade 1 ulcer beneath the first metatarsal head of the right foot status post right great toe amputation.  Patient is not on antibiotics.  Assessment & Plan: Visit Diagnoses:  1. Below-knee amputation of left lower extremity ([place])   2. 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 Amputation of right great toe ([place])   4. Foot ulcer with fat layer exposed, right ([place])     Plan: Discussed that the wound over the tibial tubercle is healthy and stable but due to the minimal soft tissue envelope that this will take a prolonged time to heal.  Have recommended proceeding with an above-the-knee amputation on the left to facilitate patient getting up on his feet.  Patient states that he would like to consider this at follow-up in 4 weeks that he does not want to consider this at this time.  Patient is still wheelchair-bound for activities of daily living.  Follow-Up Instructions: Return in about 4 weeks (around 01/15/2021).   Ortho Exam  Patient is alert, oriented, no adenopathy, well-dressed, normal affect, normal respiratory effort. Examination patient has a healthy granulating bed over the transtibial amputation there is no swelling no cellulitis no odor no drainage.  The ulcer is 30 x 35 mm with flat granulation tissue.   Examination of the right foot patient has a well-healed great toe amputation he has a Wagner grade 1 ulcer with excessive callus beneath the first metatarsal head the ulcer is 10 mm in diameter and 3 mm deep.  After informed consent a 10 blade knife was used to debride the skin and soft tissue back to bleeding viable granulation tissue after debridement the ulcer is 2 cm in diameter 3 mm deep silver nitrate was used for hemostasis a Band-Aid was applied.  Imaging: No results found. No images are attached to the encounter.  Labs: Lab Results  Component Value Date   HGBA1C 11.0 (H) 12/13/2017   REPTSTATUS 08/27/2020 FINAL 08/22/2020   GRAMSTAIN  08/22/2020    FEW WBC PRESENT,BOTH PMN AND MONONUCLEAR ABUNDANT GRAM POSITIVE COCCI IN PAIRS    CULT  08/22/2020    ABUNDANT GROUP B STREP(S.AGALACTIAE)ISOLATED FEW KLEBSIELLA OXYTOCA TESTING AGAINST S. AGALACTIAE NOT ROUTINELY PERFORMED DUE TO PREDICTABILITY OF AMP/PEN/VAN SUSCEPTIBILITY. NO ANAEROBES ISOLATED Performed at [facility], [place] [address], [place], [place] [address]    [place] OXYTOCA 08/22/2020     Lab Results  Component Value Date   ALBUMIN 3.5 08/22/2020   ALBUMIN 3.5 07/25/2020   ALBUMIN 4.0 10/26/2018    Lab Results  Component Value Date   MG 1.4 (L) 10/22/2018  MG 1.9 08/14/2008   MG 2.2 08/11/2008   No results found for: VD25OH  No results found for: PREALBUMIN CBC EXTENDED Latest Ref Rng & Units 08/22/2020 07/25/2020 07/13/2019  WBC 4.0 - 10.5 K/uL 13.1(H) 12.5(H) 15.5(H)  RBC 4.22 - 5.81 MIL/uL 5.28 5.32 5.58  HGB 13.0 - 17.0 g/dL 14.8 15.3 16.1  HCT 39.0 - 52.0 % 45.5 46.1 47.1  PLT 150 - 400 K/uL 210 243 213  NEUTROABS 1.7 - 7.7 K/uL - - -  LYMPHSABS 0.7 - 4.0 K/uL - - -     There is no height or weight on file to calculate BMI.  Orders:  No orders of the defined types were placed in this encounter.  No orders of the defined types were placed in this encounter.    Procedures: No  procedures performed  Clinical Data: No additional findings.  ROS:  All other systems negative, except as noted in the HPI. Review of Systems  Objective: Vital Signs: There were no vitals taken for this visit.  Specialty Comments:  No specialty comments available.  PMFS History: Patient Active Problem List   Diagnosis Date Noted   Abscess of leg without foot, left    Osteomyelitis of second toe of right foot ([place]) 07/12/2019   Below knee amputation status, left 01/25/2018   Wound dehiscence, surgical    Foot amputation status, left 01/03/2018   Status post left foot surgery 12/15/2017   GERD (gastroesophageal reflux disease) 09/26/2017   Hyperlipidemia associated with type 2 diabetes mellitus ([place]) 07/08/2017   Snoring 06/07/2016   Chest pain 01/22/2016   Abnormal nuclear stress test 01/22/2016   Pain, chronic due to trauma 07/04/2012   Complex regional pain syndrome I of lower limb 07/04/2012   COPD (chronic obstructive pulmonary disease) ([place]) 01/27/2011   Pre-operative cardiovascular examination 01/27/2011   Nonspecific abnormal electrocardiogram (ECG) (EKG) 01/27/2011   Murmur 01/27/2011   DM2 (diabetes mellitus, type 2) ([place])    HTN (hypertension)    HLD (hyperlipidemia)    Tobacco abuse    Past Medical History:  Diagnosis Date   Acute renal failure ([place])    in setting of NSAID use and orthopedic surgery 2010   Anxiety and depression    Chronic diastolic CHF (congestive heart failure) ([place])    a. Echo 6/17: severe conc LVH, vigorous EF, EF 65-70%, no dynamic obstruction, no RWMA, Gr 1 DD, mild TR  //  b. LHC 8/17: no sig CAD, LVEDP 28   COPD (chronic obstructive pulmonary disease) (HCC)    Diabetic ulcer of left foot (HCC)    DM2 (diabetes mellitus, type 2) ([place])    Dysrhythmia    Family history of early CAD    Fatty liver    GERD (gastroesophageal reflux disease)    History of amputation of foot (HCC)    L trans-met // R toe   History of cardiac  catheterization    a. [place] 2002: irregs  //  b. LHC in 8/17: no sig CAD, apical DK, hyperdynamic LV, LVEDP 28   History of kidney stones    History of nuclear stress test    a. Nuc 7/17: Overall, intermediate risk nuclear stress test secondary to small size of apical lateral defect and reduced ejection fraction.  EF 43%   HLD (hyperlipidemia)    HTN (hypertension)    Injuries     crushing injury to both his feet in February 2010.    Kidney calculi    Palpitations  PTSD (post-traumatic stress disorder)    Tobacco abuse     Family History  Problem Relation Age of Onset   Leukemia Mother 14       died   Lung cancer Father 54       died   Heart attack Brother 56   Heart attack Brother 47   Hypertension Brother        X3   Hypertension Sister        X2   Diabetes Sister    Stroke Sister    Diabetes Sister    Other Brother        Musician accident    Past Surgical History:  Procedure Laterality Date   AMPUTATION Left 01/03/2018   Procedure: LEFT MIDFOOT AMPUTATION/REVISION MIDAMPUTAION;  Surgeon: Mcarthur Rossetti, MD;  Location: [facility];  Service: Orthopedics;  Laterality: Left;   AMPUTATION Left 01/25/2018   Procedure: LEFT BELOW KNEE AMPUTATION;  Surgeon: [name], MD;  Location: [facility];  Service: Orthopedics;  Laterality: Left;   AMPUTATION TOE Right 07/17/2019   Procedure: AMPUTATION RIGHT FOOT 2ND TOE;  Surgeon: Mcarthur Rossetti, MD;  Location: [facility];  Service: Orthopedics;  Laterality: Right;   BELOW KNEE LEG AMPUTATION Left 01/25/2018   CARDIAC CATHETERIZATION N/A 01/22/2016   Procedure: Left Heart Cath and Coronary Angiography;  Surgeon: Peter M Martinique, MD;  Location: [facility];  Service: Cardiovascular;  Laterality: N/A;   FOOT AMPUTATION Bilateral    I & D EXTREMITY Left 12/15/2017   Procedure: IRRIGATION AND DEBRIDEMENT LEFT FOOT ULCER;  Surgeon: Mcarthur Rossetti, MD;  [facility];  Service: Orthopedics;  Laterality:  Left;   I & D EXTREMITY Left 07/25/2020   Procedure: LEFT BELOW KNEE AMPUTATION ABSCESS EXCISION AND SKIN GRAFT;  Surgeon: [name], MD;  Location: [facility];  Service: Orthopedics;  Laterality: Left;   I & D EXTREMITY Left 08/22/2020   Procedure: DEBRIDEMENT LEFT BELOW KNEE AMPUTATION AND APPLY KERECIS SKIN GRAFT;  Surgeon: [name], MD;  Location: [facility];  Service: Orthopedics;  Laterality: Left;   LITHOTRIPSY     TENDON LENGTHENING Bilateral    calf   TONSILLECTOMY     Social History   Occupational History   Occupation: DISABLED  Tobacco Use   Smoking status: Every Day    Packs/day: 1.00    Years: 40.00    Pack years: 40.00    Types: Cigarettes   Smokeless tobacco: Never  Vaping Use   Vaping Use: Never used  Substance and Sexual Activity   Alcohol use: No   Drug use: No   Sexual activity: Not on file

## 2020-12-19 ENCOUNTER — Encounter: Payer: Self-pay | Admitting: Orthopedic Surgery

## 2021-01-05 ENCOUNTER — Ambulatory Visit: Payer: Medicare HMO | Admitting: Orthopedic Surgery

## 2021-01-05 DIAGNOSIS — Z89512 Acquired absence of left leg below knee: Secondary | ICD-10-CM

## 2021-01-05 DIAGNOSIS — L97924 Non-pressure chronic ulcer of unspecified part of left lower leg with necrosis of bone: Secondary | ICD-10-CM

## 2021-01-05 DIAGNOSIS — S88112A Complete traumatic amputation at level between knee and ankle, left lower leg, initial encounter: Secondary | ICD-10-CM

## 2021-01-06 ENCOUNTER — Encounter: Payer: Self-pay | Admitting: Orthopedic Surgery

## 2021-01-06 NOTE — Progress Notes (Signed)
Office Visit Note   Patient: Troy [name]           Date of Birth: 1961/02/04           MRN: [id number] Visit Date: 01/05/2021              Requested by: [name], MD [address] [place],  [place] [address] PCP: [name], MD  Chief Complaint  Patient presents with   Left Knee - Follow-up      HPI: Patient is a 60 year old gentleman status post Kerecis skin graft for ulceration left transtibial amputation.  Patient states that he feels like its not healing fast enough.  Assessment & Plan: Visit Diagnoses:  1. 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 Below-knee amputation of left lower extremity ([place])     Plan: Again I recommended proceeding with an above-the-knee amputation patient's wife is in agreement patient states that he cannot make a decision at this time.  Discussed that with further delays this will prolong him being fit for a new prosthesis.  Patient states he understands states he cannot make a decision at this time.  Follow-Up Instructions: Return in about 4 weeks (around 02/02/2021).   Ortho Exam  Patient is alert, oriented, no adenopathy, well-dressed, normal affect, normal respiratory effort. Examination the wound bed is healthy with good granulation tissue it measures 30 x 35 mm there is about 2 mm of new epithelialization around the wound edges.  There is no redness no cellulitis no odor no drainage.  Discussed with the patient the prolonged healing and the potential for skin breakdown once he begins wearing a prosthesis.  Patient states he understands but cannot make a decision at this time.  Imaging: No results found.   Labs: Lab Results  Component Value Date   HGBA1C 11.0 (H) 12/13/2017   REPTSTATUS 08/27/2020 FINAL 08/22/2020   GRAMSTAIN  08/22/2020    FEW WBC PRESENT,BOTH PMN AND MONONUCLEAR ABUNDANT GRAM POSITIVE COCCI IN PAIRS    CULT  08/22/2020    ABUNDANT GROUP B STREP(S.AGALACTIAE)ISOLATED FEW  KLEBSIELLA OXYTOCA TESTING AGAINST S. AGALACTIAE NOT ROUTINELY PERFORMED DUE TO PREDICTABILITY OF AMP/PEN/VAN SUSCEPTIBILITY. NO ANAEROBES ISOLATED Performed at [facility], [place] [address], [place], [place] [address]    [place] OXYTOCA 08/22/2020     Lab Results  Component Value Date   ALBUMIN 3.5 08/22/2020   ALBUMIN 3.5 07/25/2020   ALBUMIN 4.0 10/26/2018    Lab Results  Component Value Date   MG 1.4 (L) 10/22/2018   MG 1.9 08/14/2008   MG 2.2 08/11/2008   No results found for: VD25OH  No results found for: PREALBUMIN CBC EXTENDED Latest Ref Rng & Units 08/22/2020 07/25/2020 07/13/2019  WBC 4.0 - 10.5 K/uL 13.1(H) 12.5(H) 15.5(H)  RBC 4.22 - 5.81 MIL/uL 5.28 5.32 5.58  HGB 13.0 - 17.0 g/dL 14.8 15.3 16.1  HCT 39.0 - 52.0 % 45.5 46.1 47.1  PLT 150 - 400 K/uL 210 243 213  NEUTROABS 1.7 - 7.7 K/uL - - -  LYMPHSABS 0.7 - 4.0 K/uL - - -     There is no height or weight on file to calculate BMI.  Orders:  No orders of the defined types were placed in this encounter.  No orders of the defined types were placed in this encounter.    Procedures: No procedures performed  Clinical Data: No additional findings.  ROS:  All other systems negative, except as noted  in the HPI. Review of Systems  Objective: Vital Signs: There were no vitals taken for this visit.  Specialty Comments:  No specialty comments available.  PMFS History: Patient Active Problem List   Diagnosis Date Noted   Abscess of leg without foot, left    Osteomyelitis of second toe of right foot ([place]) 07/12/2019   Below knee amputation status, left 01/25/2018   Wound dehiscence, surgical    Foot amputation status, left 01/03/2018   Status post left foot surgery 12/15/2017   GERD (gastroesophageal reflux disease) 09/26/2017   Hyperlipidemia associated with type 2 diabetes mellitus ([place]) 07/08/2017   Snoring 06/07/2016   Chest pain 01/22/2016   Abnormal nuclear stress test  01/22/2016   Pain, chronic due to trauma 07/04/2012   Complex regional pain syndrome I of lower limb 07/04/2012   COPD (chronic obstructive pulmonary disease) ([place]) 01/27/2011   Pre-operative cardiovascular examination 01/27/2011   Nonspecific abnormal electrocardiogram (ECG) (EKG) 01/27/2011   Murmur 01/27/2011   DM2 (diabetes mellitus, type 2) ([place])    HTN (hypertension)    HLD (hyperlipidemia)    Tobacco abuse    Past Medical History:  Diagnosis Date   Acute renal failure ([place])    in setting of NSAID use and orthopedic surgery 2010   Anxiety and depression    Chronic diastolic CHF (congestive heart failure) ([place])    a. Echo 6/17: severe conc LVH, vigorous EF, EF 65-70%, no dynamic obstruction, no RWMA, Gr 1 DD, mild TR  //  b. LHC 8/17: no sig CAD, LVEDP 28   COPD (chronic obstructive pulmonary disease) (H[facility])    Diabetic ulcer of left foot (HCC)    DM2 (diabetes mellitus, type 2) ([place])    Dysrhythmia    Family history of early CAD    Fatty liver    GERD (gastroesophageal reflux disease)    History of amputation of foot (HCC)    L trans-met // R toe   History of cardiac catheterization    a. [place] 2002: irregs  //  b. LHC in 8/17: no sig CAD, apical DK, hyperdynamic LV, LVEDP 28   History of kidney stones    History of nuclear stress test    a. Nuc 7/17: Overall, intermediate risk nuclear stress test secondary to small size of apical lateral defect and reduced ejection fraction.  EF 43%   HLD (hyperlipidemia)    HTN (hypertension)    Injuries     crushing injury to both his feet in February 2010.    Kidney calculi    Palpitations    PTSD (post-traumatic stress disorder)    Tobacco abuse     Family History  Problem Relation Age of Onset   Leukemia Mother 36       died   Lung cancer Father 10       died   Heart attack Brother 41   Heart attack Brother 77   Hypertension Brother        X3   Hypertension Sister        X2   Diabetes Sister    Stroke Sister     Diabetes Sister    Other Brother        Musician accident    Past Surgical History:  Procedure Laterality Date   AMPUTATION Left 01/03/2018   Procedure: LEFT MIDFOOT AMPUTATION/REVISION MIDAMPUTAION;  Surgeon: Mcarthur Rossetti, MD;  Location: [facility];  Service: Orthopedics;  Laterality: Left;   AMPUTATION Left 01/25/2018   Procedure: LEFT  BELOW KNEE AMPUTATION;  Surgeon: [name], MD;  Location: [facility];  Service: Orthopedics;  Laterality: Left;   AMPUTATION TOE Right 07/17/2019   Procedure: AMPUTATION RIGHT FOOT 2ND TOE;  Surgeon: Mcarthur Rossetti, MD;  Location: [facility];  Service: Orthopedics;  Laterality: Right;   BELOW KNEE LEG AMPUTATION Left 01/25/2018   CARDIAC CATHETERIZATION N/A 01/22/2016   Procedure: Left Heart Cath and Coronary Angiography;  Surgeon: Peter M Martinique, MD;  Location: [facility];  Service: Cardiovascular;  Laterality: N/A;   FOOT AMPUTATION Bilateral    I & D EXTREMITY Left 12/15/2017   Procedure: IRRIGATION AND DEBRIDEMENT LEFT FOOT ULCER;  Surgeon: Mcarthur Rossetti, MD;  [facility];  Service: Orthopedics;  Laterality: Left;   I & D EXTREMITY Left 07/25/2020   Procedure: LEFT BELOW KNEE AMPUTATION ABSCESS EXCISION AND SKIN GRAFT;  Surgeon: [name], MD;  Location: [facility];  Service: Orthopedics;  Laterality: Left;   I & D EXTREMITY Left 08/22/2020   Procedure: DEBRIDEMENT LEFT BELOW KNEE AMPUTATION AND APPLY KERECIS SKIN GRAFT;  Surgeon: [name], MD;  Location: [facility];  Service: Orthopedics;  Laterality: Left;   LITHOTRIPSY     TENDON LENGTHENING Bilateral    calf   TONSILLECTOMY     Social History   Occupational History   Occupation: DISABLED  Tobacco Use   Smoking status: Every Day    Packs/day: 1.00    Years: 40.00    Pack years: 40.00    Types: Cigarettes   Smokeless tobacco: Never  Vaping Use   Vaping Use: Never used  Substance and Sexual Activity   Alcohol use: No   Drug use: No   Sexual  activity: Not on file

## 2021-01-13 ENCOUNTER — Encounter: Payer: Self-pay | Admitting: Orthopedic Surgery

## 2021-01-15 ENCOUNTER — Other Ambulatory Visit: Payer: Self-pay

## 2021-01-15 ENCOUNTER — Encounter: Payer: Self-pay | Admitting: Orthopedic Surgery

## 2021-01-15 ENCOUNTER — Ambulatory Visit: Payer: Medicare HMO | Admitting: Orthopedic Surgery

## 2021-01-15 DIAGNOSIS — L97924 Non-pressure chronic ulcer of unspecified part of left lower leg with necrosis of bone: Secondary | ICD-10-CM

## 2021-01-15 DIAGNOSIS — S88112A Complete traumatic amputation at level between knee and ankle, left lower leg, initial encounter: Secondary | ICD-10-CM

## 2021-01-15 DIAGNOSIS — Z89512 Acquired absence of left leg below knee: Secondary | ICD-10-CM | POA: Diagnosis not present

## 2021-01-15 NOTE — Progress Notes (Signed)
Office Visit Note   Patient: Troy [name]           Date of Birth: [birth year]           MRN: [id number] Visit Date: 01/15/2021              Requested by: [name], MD [address] [place],  [place] [id number] PCP: [name], MD  Chief Complaint  Patient presents with   Left Leg - Follow-up    08/22/20 debridement left BKA kerecis graft       HPI: Patient presents in follow-up he is approximately 5 months out from previous debridement and skin graft application for his left transtibial amputation.  Patient still has persistent nonhealing with hypergranulation tissue.  Assessment & Plan: Visit Diagnoses:  1. 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 Below-knee amputation of left lower extremity ([place])     Plan: Patient states he would like to proceed with an above-the-knee amputation.  We will plan for surgery next Wednesday anticipate 1 to 2 days of hospitalization patient has been able to ambulate around home independently with his BKA without a prosthesis.  Anticipate being able to be fit for prosthesis in 4 to 6 weeks.  Follow-Up Instructions: Return in about 2 weeks (around 01/29/2021).   Ortho Exam  Patient is alert, oriented, no adenopathy, well-dressed, normal affect, normal respiratory effort. Examination patient has good hair growth in the leg and amputation.  He has a large chronic ulcer over the tibia with hypergranulation tissue there is no surrounding cellulitis no odor no drainage.  Imaging: No results found. No images are attached to the encounter.  Labs: Lab Results  Component Value Date   HGBA1C 11.0 (H) 12/13/2017   REPTSTATUS 08/27/2020 FINAL 08/22/2020   GRAMSTAIN  08/22/2020    FEW WBC PRESENT,BOTH PMN AND MONONUCLEAR ABUNDANT GRAM POSITIVE COCCI IN PAIRS    CULT  08/22/2020    ABUNDANT GROUP B STREP(S.AGALACTIAE)ISOLATED FEW KLEBSIELLA OXYTOCA TESTING AGAINST S. AGALACTIAE NOT ROUTINELY PERFORMED DUE TO  PREDICTABILITY OF AMP/PEN/VAN SUSCEPTIBILITY. NO ANAEROBES ISOLATED Performed at [facility], [place] [address], [place], [place] [address]    [place] OXYTOCA 08/22/2020     Lab Results  Component Value Date   ALBUMIN 3.5 08/22/2020   ALBUMIN 3.5 07/25/2020   ALBUMIN 4.0 10/26/2018    Lab Results  Component Value Date   MG 1.4 (L) 10/22/2018   MG 1.9 08/14/2008   MG 2.2 08/11/2008   No results found for: VD25OH  No results found for: PREALBUMIN CBC EXTENDED Latest Ref Rng & Units 08/22/2020 07/25/2020 07/13/2019  WBC 4.0 - 10.5 K/uL 13.1(H) 12.5(H) 15.5(H)  RBC 4.22 - 5.81 MIL/uL 5.28 5.32 5.58  HGB 13.0 - 17.0 g/dL 14.8 15.3 16.1  HCT 39.0 - 52.0 % 45.5 46.1 47.1  PLT 150 - 400 K/uL 210 243 213  NEUTROABS 1.7 - 7.7 K/uL - - -  LYMPHSABS 0.7 - 4.0 K/uL - - -     There is no height or weight on file to calculate BMI.  Orders:  No orders of the defined types were placed in this encounter.  No orders of the defined types were placed in this encounter.    Procedures: No procedures performed  Clinical Data: No additional findings.  ROS:  All other systems negative, except as noted in the HPI. Review of Systems  Objective: Vital Signs: There were no vitals taken for this visit.  Specialty Comments:  No specialty comments available.  PMFS History: Patient Active Problem List   Diagnosis Date Noted   Abscess of leg without foot, left    Osteomyelitis of second toe of right foot ([place]) 07/12/2019   Below knee amputation status, left 01/25/2018   Wound dehiscence, surgical    Foot amputation status, left 01/03/2018   Status post left foot surgery 12/15/2017   GERD (gastroesophageal reflux disease) 09/26/2017   Hyperlipidemia associated with type 2 diabetes mellitus ([place]) 07/08/2017   Snoring 06/07/2016   Chest pain 01/22/2016   Abnormal nuclear stress test 01/22/2016   Pain, chronic due to trauma 07/04/2012   Complex regional pain syndrome I  of lower limb 07/04/2012   COPD (chronic obstructive pulmonary disease) ([place]) 01/27/2011   Pre-operative cardiovascular examination 01/27/2011   Nonspecific abnormal electrocardiogram (ECG) (EKG) 01/27/2011   Murmur 01/27/2011   DM2 (diabetes mellitus, type 2) ([place])    HTN (hypertension)    HLD (hyperlipidemia)    Tobacco abuse    Past Medical History:  Diagnosis Date   Acute renal failure ([place])    in setting of NSAID use and orthopedic surgery 2010   Anxiety and depression    Chronic diastolic CHF (congestive heart failure) ([place])    a. Echo 6/17: severe conc LVH, vigorous EF, EF 65-70%, no dynamic obstruction, no RWMA, Gr 1 DD, mild TR  //  b. LHC 8/17: no sig CAD, LVEDP 28   COPD (chronic obstructive pulmonary disease) (H[facility])    Diabetic ulcer of left foot (H[facility])    DM2 (diabetes mellitus, type 2) ([place])    Dysrhythmia    Family history of early CAD    Fatty liver    GERD (gastroesophageal reflux disease)    History of amputation of foot ([facility])    L trans-met // R toe   History of cardiac catheterization    a. [place] 2002: irregs  //  b. LHC in 8/17: no sig CAD, apical DK, hyperdynamic LV, LVEDP 28   History of kidney stones    History of nuclear stress test    a. Nuc 7/17: Overall, intermediate risk nuclear stress test secondary to small size of apical lateral defect and reduced ejection fraction.  EF 43%   HLD (hyperlipidemia)    HTN (hypertension)    Injuries     crushing injury to both his feet in February 2010.    Kidney calculi    Palpitations    PTSD (post-traumatic stress disorder)    Tobacco abuse     Family History  Problem Relation Age of Onset   Leukemia Mother 98       died   Lung cancer Father 28       died   Heart attack Brother 35   Heart attack Brother 90   Hypertension Brother        X3   Hypertension Sister        X2   Diabetes Sister    Stroke Sister    Diabetes Sister    Other Brother        Musician accident    Past Surgical History:  Procedure  Laterality Date   AMPUTATION Left 01/03/2018   Procedure: LEFT MIDFOOT AMPUTATION/REVISION MIDAMPUTAION;  Surgeon: Mcarthur Rossetti, MD;  Location: [facility];  Service: Orthopedics;  Laterality: Left;   AMPUTATION Left 01/25/2018   Procedure: LEFT BELOW KNEE AMPUTATION;  Surgeon: [name], MD;  Location: [facility];  Service: Orthopedics;  Laterality: Left;  AMPUTATION TOE Right 07/17/2019   Procedure: AMPUTATION RIGHT FOOT 2ND TOE;  Surgeon: Mcarthur Rossetti, MD;  Location: [facility];  Service: Orthopedics;  Laterality: Right;   BELOW KNEE LEG AMPUTATION Left 01/25/2018   CARDIAC CATHETERIZATION N/A 01/22/2016   Procedure: Left Heart Cath and Coronary Angiography;  Surgeon: Peter M Martinique, MD;  Location: [facility];  Service: Cardiovascular;  Laterality: N/A;   FOOT AMPUTATION Bilateral    I & D EXTREMITY Left 12/15/2017   Procedure: IRRIGATION AND DEBRIDEMENT LEFT FOOT ULCER;  Surgeon: Mcarthur Rossetti, MD;  Location[facility];  Service: Orthopedics;  Laterality: Left;   I & D EXTREMITY Left 07/25/2020   Procedure: LEFT BELOW KNEE AMPUTATION ABSCESS EXCISION AND SKIN GRAFT;  Surgeon: [name], MD;  Location: [facility];  Service: Orthopedics;  Laterality: Left;   I & D EXTREMITY Left 08/22/2020   Procedure: DEBRIDEMENT LEFT BELOW KNEE AMPUTATION AND APPLY KERECIS SKIN GRAFT;  Surgeon: [name], MD;  Location: [facility];  Service: Orthopedics;  Laterality: Left;   LITHOTRIPSY     TENDON LENGTHENING Bilateral    calf   TONSILLECTOMY     Social History   Occupational History   Occupation: DISABLED  Tobacco Use   Smoking status: Every Day    Packs/day: 1.00    Years: 40.00    Pack years: 40.00    Types: Cigarettes   Smokeless tobacco: Never  Vaping Use   Vaping Use: Never used  Substance and Sexual Activity   Alcohol use: No   Drug use: No   Sexual activity: Not on file

## 2021-01-18 ENCOUNTER — Other Ambulatory Visit: Payer: Self-pay | Admitting: Physician Assistant

## 2021-01-20 ENCOUNTER — Other Ambulatory Visit: Payer: Self-pay

## 2021-01-20 NOTE — Progress Notes (Signed)
I spoke to Lucile Crater , Mr. Helmstetter designated person to speak to. Mr. Lyster has Marcie Bal to handle all medical information. Marcie Bal reports that Mr Hilbun has not complained of chest pain or shortness of breath`. Marcie Bal denies having any s/s of Covid in her household.  Patient denies any known exposure to Covid.   Mr. Coppa has type II diabetes, patient does not check CBGs , if CBG's are checked Marcie Bal checks it. Mr Senft has refused to allow Mr Reierson to stick his finger for a CBG.  I instructed Marcie Bal to give patient 1/2 dose of Tresiba 12 units.  I instructed Marcie Bal to also give patient 12 units of Tresiba in am.    I gave Marcie Bal instructed  to shower with antibiotic soap, if it is available.  Dry off with a clean towel. Do not put lotion, powder, cologne or deodorant or makeup.No jewelry or piercings. Men may shave their face and neck. Woman should not shave. No nail polish, artificial or acrylic nails. Wear clean clothes, brush your teeth. Glasses, contact lens,dentures or partials may not be worn in the OR. If you need to wear them, please bring a case for glasses, do not wear contacts or bring a case, the hospital does not have contact cases, dentures or partials will have to be removed , make sure they are clean, we will provide a denture cup to put them in. You will need some one to drive you home and a responsible person over the age of 41 to stay with you for the first 24 hours after surgery.

## 2021-01-20 NOTE — Anesthesia Preprocedure Evaluation (Addendum)
Anesthesia Evaluation  Patient identified by MRN, date of birth, ID band Patient awake    Reviewed: Allergy & Precautions, NPO status , Patient's Chart, lab work & pertinent test results  History of Anesthesia Complications Negative for: history of anesthetic complications  Airway Mallampati: II  TM Distance: >3 FB Neck ROM: Full    Dental  (+) Missing, Edentulous Upper,    Pulmonary COPD,  COPD inhaler, Current Smoker,    Pulmonary exam normal        Cardiovascular hypertension, Pt. on medications Normal cardiovascular exam     Neuro/Psych Anxiety Depression    GI/Hepatic Neg liver ROS, GERD  Controlled and Medicated,  Endo/Other  diabetes, Poorly Controlled, Type 2, Oral Hypoglycemic Agents, Insulin Dependent  Renal/GU negative Renal ROS  negative genitourinary   Musculoskeletal negative musculoskeletal ROS (+)   Abdominal   Peds  Hematology negative hematology ROS (+)   Anesthesia Other Findings Day of surgery medications reviewed with patient.  Reproductive/Obstetrics negative OB ROS                           Anesthesia Physical  Anesthesia Plan  ASA: 3  Anesthesia Plan: General   Post-op Pain Management: GA combined w/ Regional for post-op pain   Induction: Intravenous  PONV Risk Score and Plan: 1 and Treatment may vary due to age or medical condition, Midazolam and Ondansetron  Airway Management Planned: LMA  Additional Equipment: None  Intra-op Plan:   Post-operative Plan: Extubation in OR  Informed Consent: I have reviewed the patients History and Physical, chart, labs and discussed the procedure including the risks, benefits and alternatives for the proposed anesthesia with the patient or authorized representative who has indicated his/her understanding and acceptance.     Dental advisory given  Plan Discussed with: CRNA  Anesthesia Plan Comments:         Anesthesia Quick Evaluation

## 2021-01-21 ENCOUNTER — Encounter (HOSPITAL_COMMUNITY)
Admission: RE | Disposition: A | Discharge status: Skilled Nursing Facility | Payer: Self-pay | Source: Home / Self Care | Attending: Orthopedic Surgery

## 2021-01-21 ENCOUNTER — Encounter (HOSPITAL_COMMUNITY): Payer: Self-pay | Admitting: Orthopedic Surgery

## 2021-01-21 ENCOUNTER — Inpatient Hospital Stay (HOSPITAL_COMMUNITY): Payer: Medicare HMO | Admitting: Anesthesiology

## 2021-01-21 ENCOUNTER — Inpatient Hospital Stay (HOSPITAL_COMMUNITY)
Admission: RE | Admit: 2021-01-21 | Discharge: 2021-02-02 | DRG: 617 | Disposition: A | Discharge status: Skilled Nursing Facility | Payer: Medicare HMO | Attending: Orthopedic Surgery | Admitting: Orthopedic Surgery

## 2021-01-21 DIAGNOSIS — Z89512 Acquired absence of left leg below knee: Secondary | ICD-10-CM | POA: Diagnosis not present

## 2021-01-21 DIAGNOSIS — Z91041 Radiographic dye allergy status: Secondary | ICD-10-CM | POA: Diagnosis not present

## 2021-01-21 DIAGNOSIS — K760 Fatty (change of) liver, not elsewhere classified: Secondary | ICD-10-CM | POA: Diagnosis present

## 2021-01-21 DIAGNOSIS — Z801 Family history of malignant neoplasm of trachea, bronchus and lung: Secondary | ICD-10-CM | POA: Diagnosis not present

## 2021-01-21 DIAGNOSIS — Z87442 Personal history of urinary calculi: Secondary | ICD-10-CM | POA: Diagnosis not present

## 2021-01-21 DIAGNOSIS — I5032 Chronic diastolic (congestive) heart failure: Secondary | ICD-10-CM | POA: Diagnosis present

## 2021-01-21 DIAGNOSIS — Z833 Family history of diabetes mellitus: Secondary | ICD-10-CM | POA: Diagnosis not present

## 2021-01-21 DIAGNOSIS — K219 Gastro-esophageal reflux disease without esophagitis: Secondary | ICD-10-CM | POA: Diagnosis present

## 2021-01-21 DIAGNOSIS — L97929 Non-pressure chronic ulcer of unspecified part of left lower leg with unspecified severity: Secondary | ICD-10-CM | POA: Diagnosis present

## 2021-01-21 DIAGNOSIS — T8789 Other complications of amputation stump: Secondary | ICD-10-CM | POA: Diagnosis not present

## 2021-01-21 DIAGNOSIS — Z8249 Family history of ischemic heart disease and other diseases of the circulatory system: Secondary | ICD-10-CM

## 2021-01-21 DIAGNOSIS — M869 Osteomyelitis, unspecified: Secondary | ICD-10-CM | POA: Diagnosis present

## 2021-01-21 DIAGNOSIS — F17210 Nicotine dependence, cigarettes, uncomplicated: Secondary | ICD-10-CM | POA: Diagnosis present

## 2021-01-21 DIAGNOSIS — E1169 Type 2 diabetes mellitus with other specified complication: Principal | ICD-10-CM | POA: Diagnosis present

## 2021-01-21 DIAGNOSIS — Z806 Family history of leukemia: Secondary | ICD-10-CM | POA: Diagnosis not present

## 2021-01-21 DIAGNOSIS — Z885 Allergy status to narcotic agent status: Secondary | ICD-10-CM

## 2021-01-21 DIAGNOSIS — F4310 Post-traumatic stress disorder, unspecified: Secondary | ICD-10-CM | POA: Diagnosis present

## 2021-01-21 DIAGNOSIS — M71072 Abscess of bursa, left ankle and foot: Secondary | ICD-10-CM | POA: Diagnosis present

## 2021-01-21 DIAGNOSIS — Z823 Family history of stroke: Secondary | ICD-10-CM | POA: Diagnosis not present

## 2021-01-21 DIAGNOSIS — Y835 Amputation of limb(s) as the cause of abnormal reaction of the patient, or of later complication, without mention of misadventure at the time of the procedure: Secondary | ICD-10-CM | POA: Diagnosis present

## 2021-01-21 DIAGNOSIS — I998 Other disorder of circulatory system: Secondary | ICD-10-CM | POA: Diagnosis not present

## 2021-01-21 DIAGNOSIS — T8781 Dehiscence of amputation stump: Secondary | ICD-10-CM

## 2021-01-21 DIAGNOSIS — J449 Chronic obstructive pulmonary disease, unspecified: Secondary | ICD-10-CM | POA: Diagnosis present

## 2021-01-21 DIAGNOSIS — Z888 Allergy status to other drugs, medicaments and biological substances status: Secondary | ICD-10-CM

## 2021-01-21 DIAGNOSIS — E785 Hyperlipidemia, unspecified: Secondary | ICD-10-CM | POA: Diagnosis present

## 2021-01-21 DIAGNOSIS — I110 Hypertensive heart disease with heart failure: Secondary | ICD-10-CM | POA: Diagnosis present

## 2021-01-21 DIAGNOSIS — Z89431 Acquired absence of right foot: Secondary | ICD-10-CM | POA: Diagnosis not present

## 2021-01-21 DIAGNOSIS — Z20822 Contact with and (suspected) exposure to covid-19: Secondary | ICD-10-CM | POA: Diagnosis present

## 2021-01-21 HISTORY — DX: Abscess of bursa, left ankle and foot: M71.072

## 2021-01-21 HISTORY — PX: AMPUTATION: SHX166

## 2021-01-21 HISTORY — PX: APPLICATION OF WOUND VAC: SHX5189

## 2021-01-21 LAB — BASIC METABOLIC PANEL
Anion gap: 12 (ref 5–15)
BUN: 10 mg/dL (ref 6–20)
CO2: 24 mmol/L (ref 22–32)
Calcium: 8.9 mg/dL (ref 8.9–10.3)
Chloride: 100 mmol/L (ref 98–111)
Creatinine, Ser: 0.53 mg/dL — ABNORMAL LOW (ref 0.61–1.24)
GFR, Estimated: >60 mL/min (ref >60)
Glucose, Bld: 294 mg/dL — ABNORMAL HIGH (ref 70–99)
Potassium: 3.3 mmol/L — ABNORMAL LOW (ref 3.5–5.1)
Sodium: 136 mmol/L (ref 135–145)

## 2021-01-21 LAB — GLUCOSE, CAPILLARY
Glucose-Capillary: 276 mg/dL — ABNORMAL HIGH (ref 70–99)
Glucose-Capillary: 251 mg/dL — ABNORMAL HIGH (ref 70–99)
Glucose-Capillary: 237 mg/dL — ABNORMAL HIGH (ref 70–99)
Glucose-Capillary: 209 mg/dL — ABNORMAL HIGH (ref 70–99)
Glucose-Capillary: 186 mg/dL — ABNORMAL HIGH (ref 70–99)
Glucose-Capillary: 331 mg/dL — ABNORMAL HIGH (ref 70–99)

## 2021-01-21 LAB — CBC
HCT: 45.3 % (ref 39.0–52.0)
Hemoglobin: 15.4 g/dL (ref 13.0–17.0)
MCH: 28.7 pg (ref 26.0–34.0)
MCHC: 34 g/dL (ref 30.0–36.0)
MCV: 84.5 fL (ref 80.0–100.0)
Platelets: 227 10*3/uL (ref 150–400)
RBC: 5.36 MIL/uL (ref 4.22–5.81)
RDW: 13.3 % (ref 11.5–15.5)
WBC: 13.5 10*3/uL — ABNORMAL HIGH (ref 4.0–10.5)
nRBC: 0 % (ref 0.0–0.2)

## 2021-01-21 LAB — HEMOGLOBIN A1C
Hgb A1c MFr Bld: 12.3 % — ABNORMAL HIGH (ref 4.8–5.6)
Mean Plasma Glucose: 306.31 mg/dL

## 2021-01-21 LAB — TYPE AND SCREEN
ABO/RH(D): O POS
Antibody Screen: NEGATIVE

## 2021-01-21 LAB — SARS CORONAVIRUS 2 BY RT PCR (HOSPITAL ORDER, PERFORMED IN ~~LOC~~ HOSPITAL LAB): SARS Coronavirus 2: NEGATIVE

## 2021-01-21 SURGERY — AMPUTATION, ABOVE KNEE
Anesthesia: General | Site: Knee | Laterality: Left

## 2021-01-21 MED ORDER — LIDOCAINE HCL (CARDIAC) PF 100 MG/5ML IV SOSY
PREFILLED_SYRINGE | INTRAVENOUS | Status: DC | PRN
  Administered 2021-01-21: 100 mg via INTRAVENOUS

## 2021-01-21 MED ORDER — ALBUTEROL SULFATE HFA 108 (90 BASE) MCG/ACT IN AERS: 2.0000 | INHALATION_SPRAY | Freq: Four times a day (QID) | RESPIRATORY_TRACT | Status: DC | PRN

## 2021-01-21 MED ORDER — IPRATROPIUM BROMIDE 0.02 % IN SOLN: 0.5000 mg | Freq: Four times a day (QID) | RESPIRATORY_TRACT | Status: DC | PRN

## 2021-01-21 MED ORDER — ASPIRIN EC 81 MG PO TBEC
81.0000 mg | DELAYED_RELEASE_TABLET | Freq: Every day | ORAL | Status: DC
  Administered 2021-01-21 – 2021-02-02 (×13): 81 mg via ORAL
  Filled 2021-01-21 (×13): qty 1

## 2021-01-21 MED ORDER — PHENOL 1.4 % MT LIQD: 1.0000 | OROMUCOSAL | Status: DC | PRN

## 2021-01-21 MED ORDER — MIDAZOLAM HCL 2 MG/2ML IJ SOLN: 1.0000 mg | Freq: Once | INTRAMUSCULAR | Status: AC

## 2021-01-21 MED ORDER — LACTATED RINGERS IV SOLN: INTRAVENOUS | Status: DC

## 2021-01-21 MED ORDER — ONDANSETRON HCL 4 MG/2ML IJ SOLN: 4.0000 mg | Freq: Four times a day (QID) | INTRAMUSCULAR | Status: DC | PRN

## 2021-01-21 MED ORDER — INSULIN ASPART 100 UNIT/ML IJ SOLN
0.0000 [IU] | Freq: Three times a day (TID) | INTRAMUSCULAR | Status: DC
  Administered 2021-01-22: 8 [IU] via SUBCUTANEOUS
  Administered 2021-01-22: 11 [IU] via SUBCUTANEOUS
  Administered 2021-01-22 – 2021-01-23 (×3): 5 [IU] via SUBCUTANEOUS
  Administered 2021-01-23: 8 [IU] via SUBCUTANEOUS
  Administered 2021-01-24: 5 [IU] via SUBCUTANEOUS
  Administered 2021-01-24: 3 [IU] via SUBCUTANEOUS
  Administered 2021-01-24: 5 [IU] via SUBCUTANEOUS
  Administered 2021-01-25: 8 [IU] via SUBCUTANEOUS
  Administered 2021-01-25: 5 [IU] via SUBCUTANEOUS
  Administered 2021-01-25: 15 [IU] via SUBCUTANEOUS
  Administered 2021-01-26 (×3): 5 [IU] via SUBCUTANEOUS
  Administered 2021-01-27 (×2): 3 [IU] via SUBCUTANEOUS
  Administered 2021-01-27: 0.3 [IU] via SUBCUTANEOUS
  Administered 2021-01-28: 2 [IU] via SUBCUTANEOUS
  Administered 2021-01-28 – 2021-01-29 (×4): 3 [IU] via SUBCUTANEOUS
  Administered 2021-01-29: 5 [IU] via SUBCUTANEOUS
  Administered 2021-01-30: 3 [IU] via SUBCUTANEOUS
  Administered 2021-01-30: 8 [IU] via SUBCUTANEOUS
  Administered 2021-01-30 – 2021-01-31 (×2): 3 [IU] via SUBCUTANEOUS
  Administered 2021-01-31: 8 [IU] via SUBCUTANEOUS
  Administered 2021-01-31 – 2021-02-01 (×2): 2 [IU] via SUBCUTANEOUS
  Administered 2021-02-01: 8 [IU] via SUBCUTANEOUS
  Administered 2021-02-01: 2 [IU] via SUBCUTANEOUS
  Administered 2021-02-02: 8 [IU] via SUBCUTANEOUS
  Administered 2021-02-02: 3 [IU] via SUBCUTANEOUS

## 2021-01-21 MED ORDER — FENTANYL CITRATE (PF) 100 MCG/2ML IJ SOLN
INTRAMUSCULAR | Status: AC
  Filled 2021-01-21: qty 2

## 2021-01-21 MED ORDER — TRAZODONE HCL 50 MG PO TABS
50.0000 mg | ORAL_TABLET | Freq: Every day | ORAL | Status: DC
  Administered 2021-01-21 – 2021-01-22 (×2): 100 mg via ORAL
  Filled 2021-01-21 (×2): qty 2

## 2021-01-21 MED ORDER — OXYCODONE HCL 5 MG PO TABS
5.0000 mg | ORAL_TABLET | ORAL | Status: DC | PRN
  Administered 2021-01-21 – 2021-01-24 (×4): 10 mg via ORAL
  Filled 2021-01-21 (×5): qty 2

## 2021-01-21 MED ORDER — INSULIN ASPART 100 UNIT/ML IJ SOLN
0.0000 [IU] | Freq: Every day | INTRAMUSCULAR | Status: DC
  Administered 2021-01-21: 4 [IU] via SUBCUTANEOUS
  Administered 2021-01-22: 2 [IU] via SUBCUTANEOUS
  Administered 2021-01-23: 4 [IU] via SUBCUTANEOUS
  Administered 2021-01-24: 3 [IU] via SUBCUTANEOUS
  Administered 2021-01-30: 2 [IU] via SUBCUTANEOUS

## 2021-01-21 MED ORDER — JUVEN PO PACK
1.0000 | PACK | Freq: Two times a day (BID) | ORAL | Status: DC
  Administered 2021-01-23 – 2021-02-02 (×18): 1 via ORAL
  Filled 2021-01-21 (×22): qty 1

## 2021-01-21 MED ORDER — POLYETHYLENE GLYCOL 3350 17 G PO PACK
17.0000 g | PACK | Freq: Every day | ORAL | Status: DC | PRN
  Administered 2021-01-26 – 2021-01-31 (×3): 17 g via ORAL
  Filled 2021-01-21 (×3): qty 1

## 2021-01-21 MED ORDER — OXYCODONE HCL 5 MG PO TABS
ORAL_TABLET | ORAL | Status: AC
  Filled 2021-01-21: qty 1

## 2021-01-21 MED ORDER — CLONIDINE HCL (ANALGESIA) 100 MCG/ML EP SOLN
EPIDURAL | Status: DC | PRN
  Administered 2021-01-21: 67 ug
  Administered 2021-01-21: 33 ug

## 2021-01-21 MED ORDER — ASCORBIC ACID 500 MG PO TABS
1000.0000 mg | ORAL_TABLET | Freq: Every day | ORAL | Status: DC
  Administered 2021-01-22 – 2021-02-02 (×12): 1000 mg via ORAL
  Filled 2021-01-21 (×12): qty 2

## 2021-01-21 MED ORDER — 0.9 % SODIUM CHLORIDE (POUR BTL) OPTIME
TOPICAL | Status: DC | PRN
  Administered 2021-01-21: 1000 mL

## 2021-01-21 MED ORDER — ACETAMINOPHEN 325 MG PO TABS: 325.0000 mg | ORAL_TABLET | Freq: Four times a day (QID) | ORAL | Status: DC | PRN

## 2021-01-21 MED ORDER — BISACODYL 5 MG PO TBEC
5.0000 mg | DELAYED_RELEASE_TABLET | Freq: Every day | ORAL | Status: DC | PRN
  Administered 2021-01-26 – 2021-02-01 (×4): 5 mg via ORAL
  Filled 2021-01-21 (×5): qty 1

## 2021-01-21 MED ORDER — ZINC SULFATE 220 (50 ZN) MG PO CAPS
220.0000 mg | ORAL_CAPSULE | Freq: Every day | ORAL | Status: DC
  Administered 2021-01-21 – 2021-02-02 (×13): 220 mg via ORAL
  Filled 2021-01-21 (×13): qty 1

## 2021-01-21 MED ORDER — MAGNESIUM CITRATE PO SOLN: 1.0000 | Freq: Once | ORAL | Status: DC | PRN

## 2021-01-21 MED ORDER — CEFAZOLIN SODIUM-DEXTROSE 2-4 GM/100ML-% IV SOLN
2.0000 g | Freq: Three times a day (TID) | INTRAVENOUS | Status: AC
  Administered 2021-01-21 – 2021-01-22 (×2): 2 g via INTRAVENOUS
  Filled 2021-01-21 (×2): qty 100

## 2021-01-21 MED ORDER — ACETAMINOPHEN 500 MG PO TABS
1000.0000 mg | ORAL_TABLET | Freq: Once | ORAL | Status: DC
  Filled 2021-01-21: qty 2

## 2021-01-21 MED ORDER — MIDAZOLAM HCL 2 MG/2ML IJ SOLN
INTRAMUSCULAR | Status: AC
  Filled 2021-01-21: qty 2

## 2021-01-21 MED ORDER — FENTANYL CITRATE (PF) 100 MCG/2ML IJ SOLN
INTRAMUSCULAR | Status: DC | PRN
  Administered 2021-01-21 (×2): 50 ug via INTRAVENOUS
  Administered 2021-01-21: 100 ug via INTRAVENOUS
  Administered 2021-01-21: 50 ug via INTRAVENOUS

## 2021-01-21 MED ORDER — DULAGLUTIDE 1.5 MG/0.5ML ~~LOC~~ SOAJ
1.5000 mg | SUBCUTANEOUS | Status: DC
  Administered 2021-01-25: 1.5 mg via SUBCUTANEOUS
  Filled 2021-01-21 (×2): qty 0.5

## 2021-01-21 MED ORDER — GABAPENTIN 300 MG PO CAPS
300.0000 mg | ORAL_CAPSULE | Freq: Four times a day (QID) | ORAL | Status: DC
  Administered 2021-01-21 – 2021-02-02 (×47): 300 mg via ORAL
  Filled 2021-01-21 (×47): qty 1

## 2021-01-21 MED ORDER — ALBUTEROL SULFATE (2.5 MG/3ML) 0.083% IN NEBU: 2.5000 mg | INHALATION_SOLUTION | Freq: Four times a day (QID) | RESPIRATORY_TRACT | Status: DC | PRN

## 2021-01-21 MED ORDER — HYDROMORPHONE HCL 1 MG/ML IJ SOLN
0.5000 mg | INTRAMUSCULAR | Status: DC | PRN
  Administered 2021-01-21 – 2021-01-23 (×7): 1 mg via INTRAVENOUS
  Filled 2021-01-21 (×7): qty 1

## 2021-01-21 MED ORDER — INSULIN ASPART 100 UNIT/ML IJ SOLN
6.0000 [IU] | Freq: Once | INTRAMUSCULAR | Status: AC
  Administered 2021-01-21: 6 [IU] via SUBCUTANEOUS

## 2021-01-21 MED ORDER — MIDAZOLAM HCL 2 MG/2ML IJ SOLN
INTRAMUSCULAR | Status: AC
  Administered 2021-01-21: 1 mg via INTRAVENOUS
  Filled 2021-01-21: qty 2

## 2021-01-21 MED ORDER — ALUM & MAG HYDROXIDE-SIMETH 200-200-20 MG/5ML PO SUSP: 15.0000 mL | ORAL | Status: DC | PRN

## 2021-01-21 MED ORDER — CEFAZOLIN SODIUM-DEXTROSE 2-4 GM/100ML-% IV SOLN
2.0000 g | INTRAVENOUS | Status: AC
  Administered 2021-01-21: 2 g via INTRAVENOUS
  Filled 2021-01-21: qty 100

## 2021-01-21 MED ORDER — PROMETHAZINE HCL 25 MG/ML IJ SOLN: 6.2500 mg | INTRAMUSCULAR | Status: DC | PRN

## 2021-01-21 MED ORDER — PROPOFOL 10 MG/ML IV BOLUS
INTRAVENOUS | Status: DC | PRN
  Administered 2021-01-21: 150 mg via INTRAVENOUS

## 2021-01-21 MED ORDER — INSULIN ASPART 100 UNIT/ML IJ SOLN
0.0000 [IU] | Freq: Three times a day (TID) | INTRAMUSCULAR | Status: DC
  Administered 2021-01-21: 3 [IU] via SUBCUTANEOUS

## 2021-01-21 MED ORDER — FENTANYL CITRATE (PF) 250 MCG/5ML IJ SOLN
INTRAMUSCULAR | Status: AC
  Filled 2021-01-21: qty 5

## 2021-01-21 MED ORDER — PANTOPRAZOLE SODIUM 40 MG PO TBEC
40.0000 mg | DELAYED_RELEASE_TABLET | Freq: Every day | ORAL | Status: DC
  Administered 2021-01-21 – 2021-02-02 (×13): 40 mg via ORAL
  Filled 2021-01-21 (×13): qty 1

## 2021-01-21 MED ORDER — PHENYLEPHRINE HCL-NACL 20-0.9 MG/250ML-% IV SOLN
INTRAVENOUS | Status: DC | PRN
Start: 2021-01-21 — End: 2021-01-21
  Administered 2021-01-21: 50 ug/min via INTRAVENOUS

## 2021-01-21 MED ORDER — POTASSIUM CHLORIDE CRYS ER 20 MEQ PO TBCR
20.0000 meq | EXTENDED_RELEASE_TABLET | Freq: Every day | ORAL | Status: AC | PRN
  Administered 2021-01-21: 40 meq via ORAL
  Filled 2021-01-21: qty 2

## 2021-01-21 MED ORDER — DOCUSATE SODIUM 100 MG PO CAPS
100.0000 mg | ORAL_CAPSULE | Freq: Every day | ORAL | Status: DC
  Administered 2021-01-23 – 2021-02-02 (×11): 100 mg via ORAL
  Filled 2021-01-21 (×12): qty 1

## 2021-01-21 MED ORDER — FENTANYL CITRATE (PF) 100 MCG/2ML IJ SOLN
INTRAMUSCULAR | Status: AC
  Administered 2021-01-21: 100 ug via INTRAVENOUS
  Filled 2021-01-21: qty 2

## 2021-01-21 MED ORDER — BUPIVACAINE-EPINEPHRINE (PF) 0.5% -1:200000 IJ SOLN
INTRAMUSCULAR | Status: DC | PRN
  Administered 2021-01-21: 20 mL via PERINEURAL
  Administered 2021-01-21: 10 mL via PERINEURAL

## 2021-01-21 MED ORDER — FENTANYL CITRATE (PF) 100 MCG/2ML IJ SOLN
100.0000 ug | Freq: Once | INTRAMUSCULAR | Status: AC
Start: 2021-01-21 — End: 2021-01-21

## 2021-01-21 MED ORDER — AMITRIPTYLINE HCL 50 MG PO TABS
150.0000 mg | ORAL_TABLET | Freq: Every day | ORAL | Status: DC
  Administered 2021-01-21 – 2021-02-01 (×12): 150 mg via ORAL
  Filled 2021-01-21 (×12): qty 3

## 2021-01-21 MED ORDER — INSULIN GLARGINE-YFGN 100 UNIT/ML ~~LOC~~ SOLN
12.0000 [IU] | Freq: Two times a day (BID) | SUBCUTANEOUS | Status: DC
  Administered 2021-01-21 – 2021-01-22 (×2): 12 [IU] via SUBCUTANEOUS
  Filled 2021-01-21 (×3): qty 0.12

## 2021-01-21 MED ORDER — MIDAZOLAM HCL 5 MG/5ML IJ SOLN
INTRAMUSCULAR | Status: DC | PRN
  Administered 2021-01-21: 2 mg via INTRAVENOUS

## 2021-01-21 MED ORDER — OXYCODONE HCL 5 MG/5ML PO SOLN: 5.0000 mg | Freq: Once | ORAL | Status: AC | PRN

## 2021-01-21 MED ORDER — OXYCODONE HCL 5 MG PO TABS
5.0000 mg | ORAL_TABLET | Freq: Once | ORAL | Status: AC | PRN
  Administered 2021-01-21: 5 mg via ORAL

## 2021-01-21 MED ORDER — FENTANYL CITRATE (PF) 100 MCG/2ML IJ SOLN
25.0000 ug | INTRAMUSCULAR | Status: DC | PRN
  Administered 2021-01-21: 25 ug via INTRAVENOUS
  Administered 2021-01-21 (×2): 50 ug via INTRAVENOUS

## 2021-01-21 MED ORDER — INSULIN ASPART 100 UNIT/ML IJ SOLN
6.0000 [IU] | Freq: Once | INTRAMUSCULAR | Status: AC
  Administered 2021-01-21: 6 [IU] via SUBCUTANEOUS
  Filled 2021-01-21: qty 0.06

## 2021-01-21 MED ORDER — GUAIFENESIN-DM 100-10 MG/5ML PO SYRP: 15.0000 mL | ORAL_SOLUTION | ORAL | Status: DC | PRN

## 2021-01-21 MED ORDER — OXYCODONE HCL 5 MG PO TABS
10.0000 mg | ORAL_TABLET | ORAL | Status: DC | PRN
  Administered 2021-01-23: 15 mg via ORAL
  Administered 2021-01-23: 10 mg via ORAL
  Administered 2021-01-24: 15 mg via ORAL
  Filled 2021-01-21 (×2): qty 3

## 2021-01-21 MED ORDER — CHLORHEXIDINE GLUCONATE 0.12 % MT SOLN
15.0000 mL | OROMUCOSAL | Status: AC
  Administered 2021-01-21: 15 mL via OROMUCOSAL
  Filled 2021-01-21: qty 15

## 2021-01-21 MED ORDER — TIZANIDINE HCL 2 MG PO TABS
4.0000 mg | ORAL_TABLET | Freq: Every day | ORAL | Status: DC
Start: 2021-01-21 — End: 2021-01-23
  Administered 2021-01-21 – 2021-01-22 (×2): 4 mg via ORAL
  Filled 2021-01-21 (×2): qty 2

## 2021-01-21 MED ORDER — SODIUM CHLORIDE 0.9 % IV SOLN: INTRAVENOUS | Status: DC

## 2021-01-21 MED ORDER — TRANEXAMIC ACID-NACL 1000-0.7 MG/100ML-% IV SOLN
1000.0000 mg | Freq: Once | INTRAVENOUS | Status: AC
  Administered 2021-01-21: 1000 mg via INTRAVENOUS
  Filled 2021-01-21: qty 100

## 2021-01-21 SURGICAL SUPPLY — 41 items
BAG COUNTER SPONGE SURGICOUNT (BAG) IMPLANT
BAG SPNG CNTER NS LX DISP (BAG)
BLADE SAW RECIP 87.9 MT (BLADE) ×2 IMPLANT
BLADE SURG 21 STRL SS (BLADE) ×2 IMPLANT
BNDG COHESIVE 6X5 TAN STRL LF (GAUZE/BANDAGES/DRESSINGS) ×2 IMPLANT
CANISTER WOUND CARE 500ML ATS (WOUND CARE) IMPLANT
CANISTER WOUNDNEG PRESSURE 500 (CANNISTER) ×1 IMPLANT
COVER SURGICAL LIGHT HANDLE (MISCELLANEOUS) ×2 IMPLANT
CUFF TOURN SGL QUICK 34 (TOURNIQUET CUFF)
CUFF TRNQT CYL 34X4.125X (TOURNIQUET CUFF) IMPLANT
DRAPE DERMATAC (DRAPES) ×2 IMPLANT
DRAPE INCISE IOBAN 66X45 STRL (DRAPES) ×4 IMPLANT
DRAPE U-SHAPE 47X51 STRL (DRAPES) ×2 IMPLANT
DRESSING PREVENA PLUS CUSTOM (GAUZE/BANDAGES/DRESSINGS) ×1 IMPLANT
DRSG PREVENA PLUS CUSTOM (GAUZE/BANDAGES/DRESSINGS) ×2
DURAPREP 26ML APPLICATOR (WOUND CARE) ×2 IMPLANT
ELECT REM PT RETURN 9FT ADLT (ELECTROSURGICAL) ×2
ELECTRODE REM PT RTRN 9FT ADLT (ELECTROSURGICAL) ×1 IMPLANT
GLOVE SURG ORTHO LTX SZ9 (GLOVE) ×2 IMPLANT
GLOVE SURG POLYISO LF SZ6.5 (GLOVE) ×2 IMPLANT
GLOVE SURG UNDER POLY LF SZ7.5 (GLOVE) ×2 IMPLANT
GLOVE SURG UNDER POLY LF SZ9 (GLOVE) ×2 IMPLANT
GOWN STRL REUS W/ TWL LRG LVL3 (GOWN DISPOSABLE) ×1 IMPLANT
GOWN STRL REUS W/ TWL XL LVL3 (GOWN DISPOSABLE) ×2 IMPLANT
GOWN STRL REUS W/TWL LRG LVL3 (GOWN DISPOSABLE) ×2
GOWN STRL REUS W/TWL XL LVL3 (GOWN DISPOSABLE) ×4
KIT BASIN OR (CUSTOM PROCEDURE TRAY) ×2 IMPLANT
KIT TURNOVER KIT B (KITS) ×2 IMPLANT
MANIFOLD NEPTUNE II (INSTRUMENTS) ×2 IMPLANT
NS IRRIG 1000ML POUR BTL (IV SOLUTION) ×2 IMPLANT
PACK ORTHO EXTREMITY (CUSTOM PROCEDURE TRAY) ×2 IMPLANT
PAD ARMBOARD 7.5X6 YLW CONV (MISCELLANEOUS) ×2 IMPLANT
PREVENA RESTOR ARTHOFORM 46X30 (CANNISTER) ×2 IMPLANT
STAPLER VISISTAT 35W (STAPLE) IMPLANT
STOCKINETTE IMPERVIOUS LG (DRAPES) IMPLANT
SUT ETHILON 2 0 PSLX (SUTURE) ×4 IMPLANT
SUT SILK 2 0 (SUTURE) ×2
SUT SILK 2-0 18XBRD TIE 12 (SUTURE) ×1 IMPLANT
TOWEL GREEN STERILE FF (TOWEL DISPOSABLE) ×2 IMPLANT
TUBE CONNECTING 20X1/4 (TUBING) ×2 IMPLANT
YANKAUER SUCT BULB TIP NO VENT (SUCTIONS) ×2 IMPLANT

## 2021-01-21 NOTE — Interval H&P Note (Signed)
History and Physical Interval Note:  01/21/2021 11:21 AM  Troy [name]  has presented today for surgery, with the diagnosis of Osteomyelitis Left Below Knee Amptuation.  The various methods of treatment have been discussed with the patient and family. After consideration of risks, benefits and other options for treatment, the patient has consented to  Procedure(s): LEFT ABOVE KNEE AMPUTATION (Left) as a surgical intervention.  The patient's history has been reviewed, patient examined, no change in status, stable for surgery.  I have reviewed the patient's chart and labs.  Questions were answered to the patient's satisfaction.     [name]

## 2021-01-21 NOTE — Progress Notes (Signed)
Inpatient Diabetes Program Recommendations  AACE/ADA: New Consensus Statement on Inpatient Glycemic Control (2015)  Target Ranges:  Prepandial:   less than 140 mg/dL      Peak postprandial:   less than 180 mg/dL (1-2 hours)      Critically ill patients:  140 - 180 mg/dL   Lab Results  Component Value Date   GLUCAP 209 (H) 01/21/2021   HGBA1C 12.3 (H) 01/21/2021    Review of Glycemic Control  Diabetes history: DM2 Outpatient Diabetes medications: Tresiba 24 units BID, Trulicity 1.5 mg weekly on Sun, Xigduo 10/998 mg QD Current orders for Inpatient glycemic control: Novolog 0-15 units TID with meals, Trulicity 1.5 mg Q Sunday  HgbA1C - 12.3% - poor glycemic control  Inpatient Diabetes Program Recommendations:    Semglee12 units BID Add Novolog HS correction   Will see pt on 8/4 to discuss HgbA1C of 12.3% and importance of good glycemic control.Thank you.  Lorenda Peck, RD, LDN, CDE Inpatient Diabetes Coordinator [phone]

## 2021-01-21 NOTE — H&P (Signed)
Troy [name] is an 60 y.o. male.   Chief Complaint: Non healing ulcer left leg HPI: presents in follow-up he is approximately 5 months out from previous debridement and skin graft application for his left transtibial amputation.  Patient still has persistent nonhealing with hypergranulation tissue.  Past Medical History:  Diagnosis Date   Acute renal failure ([place])    in setting of NSAID use and orthopedic surgery 2010   Anxiety and depression    Chronic diastolic CHF (congestive heart failure) ([place])    a. Echo 6/17: severe conc LVH, vigorous EF, EF 65-70%, no dynamic obstruction, no RWMA, Gr 1 DD, mild TR  //  b. LHC 8/17: no sig CAD, LVEDP 28   COPD (chronic obstructive pulmonary disease) ([facility])    Diabetic ulcer of left foot ([facility])    DM2 (diabetes mellitus, type 2) ([place])    Dysrhythmia    Family history of early CAD    Fatty liver    GERD (gastroesophageal reflux disease)    History of amputation of foot ([facility])    L trans-met // R toe   History of cardiac catheterization    a. [place] 2002: irregs  //  b. LHC in 8/17: no sig CAD, apical DK, hyperdynamic LV, LVEDP 28   History of kidney stones    History of nuclear stress test    a. Nuc 7/17: Overall, intermediate risk nuclear stress test secondary to small size of apical lateral defect and reduced ejection fraction.  EF 43%   HLD (hyperlipidemia)    HTN (hypertension)    Injuries     crushing injury to both his feet in February 2010.    Kidney calculi    Palpitations    PTSD (post-traumatic stress disorder)    Tobacco abuse     Past Surgical History:  Procedure Laterality Date   AMPUTATION Left 01/03/2018   Procedure: LEFT MIDFOOT AMPUTATION/REVISION MIDAMPUTAION;  Surgeon: Mcarthur Rossetti, MD;  Location: [facility];  Service: Orthopedics;  Laterality: Left;   AMPUTATION Left 01/25/2018   Procedure: LEFT BELOW KNEE AMPUTATION;  Surgeon: [name], MD;  Location: [facility];  Service: Orthopedics;  Laterality: Left;   AMPUTATION  TOE Right 07/17/2019   Procedure: AMPUTATION RIGHT FOOT 2ND TOE;  Surgeon: Mcarthur Rossetti, MD;  Location: [facility];  Service: Orthopedics;  Laterality: Right;   BELOW KNEE LEG AMPUTATION Left 01/25/2018   CARDIAC CATHETERIZATION N/A 01/22/2016   Procedure: Left Heart Cath and Coronary Angiography;  Surgeon: Peter M Martinique, MD;  Location: [facility];  Service: Cardiovascular;  Laterality: N/A;   FOOT AMPUTATION Bilateral    I & D EXTREMITY Left 12/15/2017   Procedure: IRRIGATION AND DEBRIDEMENT LEFT FOOT ULCER;  Surgeon: Mcarthur Rossetti, MD;  [facility];  Service: Orthopedics;  Laterality: Left;   I & D EXTREMITY Left 07/25/2020   Procedure: LEFT BELOW KNEE AMPUTATION ABSCESS EXCISION AND SKIN GRAFT;  Surgeon: [name], MD;  Location: [facility];  Service: Orthopedics;  Laterality: Left;   I & D EXTREMITY Left 08/22/2020   Procedure: DEBRIDEMENT LEFT BELOW KNEE AMPUTATION AND APPLY KERECIS SKIN GRAFT;  Surgeon: [name], MD;  Location: [facility];  Service: Orthopedics;  Laterality: Left;   LITHOTRIPSY     TENDON LENGTHENING Bilateral    calf   TONSILLECTOMY      Family History  Problem Relation Age of Onset   Leukemia Mother 14  died   Lung cancer Father 67       died   Heart attack Brother 88   Heart attack Brother 32   Hypertension Brother        X3   Hypertension Sister        X2   Diabetes Sister    Stroke Sister    Diabetes Sister    Other Brother        Musician accident   Social History:  reports that he has been smoking cigarettes. He has a 40.00 pack-year smoking history. He has never used smokeless tobacco. He reports that he does not drink alcohol and does not use drugs.  Allergies:  Allergies  Allergen Reactions   Hydromorphone Hcl Er Anaphylaxis and Other (See Comments)    Allergic to DYE in extended-release tablet, can tolerate other forms of hydromorphone   Tapentadol Anaphylaxis, Swelling and Other (See Comments)     THROAT ANGIOEDEMA Nucynta [Tapentadol Hydrochloride]   Exalamide Other (See Comments)    UNSPECIFIED REACTION     No medications prior to admission.    No results found for this or any previous visit (from the past 48 hour(s)). No results found.  Review of Systems  All other systems reviewed and are negative.  There were no vitals taken for this visit. Physical Exam  Patient is alert, oriented, no adenopathy, well-dressed, normal affect, normal respiratory effort. Examination patient has good hair growth in the leg and amputation.  He has a large chronic ulcer over the tibia with hypergranulation tissue there is no surrounding cellulitis no odor no drainage.Heart RRR Lungs Clear Assessment/Plan . 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 Below-knee amputation of left lower extremity ([place])       Plan: Patient states he would like to proceed with an above-the-knee amputation.  We will plan for surgery next Wednesday anticipate 1 to 2 days of hospitalization patient has been able to ambulate around home independently with his BKA without a prosthesis.  Anticipate being able to be fit for prosthesis in 4 to 6 weeks.  Bevely Palmer Ming Mcmannis, PA 01/21/2021, 6:35 AM

## 2021-01-21 NOTE — Anesthesia Procedure Notes (Signed)
Anesthesia Regional Block: Femoral nerve block   Pre-Anesthetic Checklist: , timeout performed,  Correct Patient, Correct Site, Correct Laterality,  Correct Procedure, Correct Position, site marked,  Risks and benefits discussed,  Pre-op evaluation,  At surgeon's request and post-op pain management  Laterality: Left  Prep: Maximum Sterile Barrier Precautions used, chloraprep       Needles:  Injection technique: Single-shot  Needle Type: Echogenic Stimulator Needle     Needle Length: 9cm  Needle Gauge: 22     Additional Needles:   Procedures:,,,, ultrasound used (permanent image in chart),,    Narrative:  Start time: 01/21/2021 10:53 AM End time: 01/21/2021 10:55 AM Injection made incrementally with aspirations every 5 mL.  Performed by: Personally  Anesthesiologist: Brennan Bailey, MD  Additional Notes: Risks, benefits, and alternative discussed. Patient gave consent for procedure. Patient prepped and draped in sterile fashion. Sedation administered, patient remains easily responsive to voice. Relevant anatomy identified with ultrasound guidance. Local anesthetic given in 5cc increments with no signs or symptoms of intravascular injection. No pain or paraesthesias with injection. Patient monitored throughout procedure with signs of LAST or immediate complications. Tolerated well. Ultrasound image placed in chart.  Tawny Asal, MD

## 2021-01-21 NOTE — Anesthesia Procedure Notes (Signed)
Anesthesia Regional Block: Sciatic   Pre-Anesthetic Checklist: , timeout performed,  Correct Patient, Correct Site, Correct Laterality,  Correct Procedure, Correct Position, site marked,  Risks and benefits discussed,  Pre-op evaluation,  At surgeon's request and post-op pain management  Laterality: Left  Prep: Maximum Sterile Barrier Precautions used, chloraprep       Needles:  Injection technique: Single-shot  Needle Type: Echogenic Stimulator Needle     Needle Length: 9cm  Needle Gauge: 22     Additional Needles:   Procedures:, nerve stimulator,,,,,    Narrative:  Start time: 01/21/2021 10:56 AM End time: 01/21/2021 10:59 AM Injection made incrementally with aspirations every 5 mL.  Performed by: Personally  Anesthesiologist: Brennan Bailey, MD  Additional Notes: Risks, benefits, and alternative discussed. Patient gave consent for procedure. Patient prepped and draped in sterile fashion. Sedation administered, patient remains easily responsive to voice. Local anesthetic given in 5cc increments with no signs or symptoms of intravascular injection. No pain or paraesthesias with injection. Patient monitored throughout procedure with signs of LAST or immediate complications. Tolerated well.   Tawny Asal, MD

## 2021-01-21 NOTE — Transfer of Care (Signed)
Immediate Anesthesia Transfer of Care Note  Patient: Troy [name]  Procedure(s) Performed: LEFT ABOVE KNEE AMPUTATION (Left: Knee) APPLICATION OF WOUND VAC (Left: Knee)  Patient Location: PACU  Anesthesia Type:General  Level of Consciousness: awake, alert , oriented, patient cooperative and responds to stimulation  Airway & Oxygen Therapy: Patient Spontanous Breathing and Patient connected to face mask oxygen  Post-op Assessment: Report given to RN, Post -op Vital signs reviewed and stable and Patient moving all extremities X 4  Post vital signs: Reviewed and stable  Last Vitals:  Vitals Value Taken Time  BP 123/88 01/21/21 1226  Temp    Pulse 113 01/21/21 1228  Resp 15 01/21/21 1228  SpO2 99 % 01/21/21 1228  Vitals shown include unvalidated device data.  Last Pain:  Vitals:   01/21/21 0859  TempSrc:   PainSc: 2       Patients Stated Pain Goal: 0 (46/04/79 [id number])  Complications: No notable events documented.

## 2021-01-21 NOTE — Anesthesia Procedure Notes (Signed)
Procedure Name: LMA Insertion Date/Time: 01/21/2021 11:47 AM Performed by: Jonna Munro, CRNA Pre-anesthesia Checklist: Patient identified, Emergency Drugs available, Suction available, Patient being monitored and Timeout performed Patient Re-evaluated:Patient Re-evaluated prior to induction Oxygen Delivery Method: Circle system utilized Preoxygenation: Pre-oxygenation with 100% oxygen Induction Type: IV induction LMA: LMA inserted LMA Size: 5.0 Number of attempts: 1 Placement Confirmation: positive ETCO2 and breath sounds checked- equal and bilateral Tube secured with: Tape Dental Injury: Teeth and Oropharynx as per pre-operative assessment

## 2021-01-21 NOTE — Anesthesia Postprocedure Evaluation (Signed)
Anesthesia Post Note  Patient: Troy [name]  Procedure(s) Performed: LEFT ABOVE KNEE AMPUTATION (Left: Knee) APPLICATION OF WOUND VAC (Left: Knee)     Patient location during evaluation: PACU Anesthesia Type: General Level of consciousness: awake and alert and oriented Pain management: pain level controlled Vital Signs Assessment: post-procedure vital signs reviewed and stable Respiratory status: spontaneous breathing, nonlabored ventilation and respiratory function stable Cardiovascular status: blood pressure returned to baseline Postop Assessment: no apparent nausea or vomiting Anesthetic complications: no   No notable events documented.  Last Vitals:  Vitals:   01/21/21 1311 01/21/21 1326  BP: (!) 142/89 134/88  Pulse: (!) 113 (!) 111  Resp: 12 17  Temp:  (!) 36.2 C  SpO2: 99% 97%    Last Pain:  Vitals:   01/21/21 1326  TempSrc:   PainSc: [place]

## 2021-01-21 NOTE — Progress Notes (Signed)
Pt [id number]. Notified on call [facility] and received new orders.

## 2021-01-21 NOTE — Op Note (Signed)
01/21/2021  12:26 PM  PATIENT:  Troy [name]    PRE-OPERATIVE DIAGNOSIS:  Osteomyelitis Left Below Knee Amptuation  POST-OPERATIVE DIAGNOSIS:  Same  PROCEDURE:  LEFT ABOVE KNEE AMPUTATION, APPLICATION OF WOUND VAC  SURGEON:  [name], MD  PHYSICIAN ASSISTANT:None ANESTHESIA:   General  PREOPERATIVE INDICATIONS:  Troy [name] is a  60 y.o. male with a diagnosis of Osteomyelitis Left Below Knee Amptuation who failed conservative measures and elected for surgical management.    The risks benefits and alternatives were discussed with the patient preoperatively including but not limited to the risks of infection, bleeding, nerve injury, cardiopulmonary complications, the need for revision surgery, among others, and the patient was willing to proceed.  OPERATIVE IMPLANTS: Praveena customizable and Arthur form wound VAC sponges  @ENCIMAGES @  OPERATIVE FINDINGS: Muscle with good contractility no abscess to the level of amputation.  OPERATIVE PROCEDURE: Patient was brought the operating room after undergoing a regional anesthetic.  After adequate levels anesthesia obtained patient's left lower extremity was prepped using DuraPrep draped into a sterile field a timeout was called.  A fishmouth incision was made just proximal to the patella this was carried down extra-articular to the femur the femur was resected with a reciprocating saw.  The vascular bundle medially was clamped suture-ligated with 2-0 silk the amputation was completed.  Electrocardio was used for further hemostasis.  The deep and superficial fascial layers was closed using #1 Vicryl skin was closed using nylon a Prevena customizable and Arthur form wound VAC was applied this had a good suction fit patient was taken the PACU in stable condition   DISCHARGE PLANNING:  Antibiotic duration: Continue antibiotics for 24 hours  Weightbearing: Nonweightbearing on the left  Pain medication: Opioid pathway  Dressing care/  Wound VAC: Continue wound VAC for 1 week  Ambulatory devices: Walker or crutches  Discharge to: Anticipate discharge to home  Follow-up: In the office 1 week post operative.

## 2021-01-22 ENCOUNTER — Encounter (HOSPITAL_COMMUNITY): Payer: Self-pay | Admitting: Orthopedic Surgery

## 2021-01-22 LAB — CBC
HCT: 38.6 % — ABNORMAL LOW (ref 39.0–52.0)
Hemoglobin: 12.9 g/dL — ABNORMAL LOW (ref 13.0–17.0)
MCH: 28.6 pg (ref 26.0–34.0)
MCHC: 33.4 g/dL (ref 30.0–36.0)
MCV: 85.6 fL (ref 80.0–100.0)
Platelets: 176 10*3/uL (ref 150–400)
RBC: 4.51 MIL/uL (ref 4.22–5.81)
RDW: 13.5 % (ref 11.5–15.5)
WBC: 15.2 10*3/uL — ABNORMAL HIGH (ref 4.0–10.5)
nRBC: 0 % (ref 0.0–0.2)

## 2021-01-22 LAB — BASIC METABOLIC PANEL
Anion gap: 8 (ref 5–15)
BUN: 15 mg/dL (ref 6–20)
CO2: 27 mmol/L (ref 22–32)
Calcium: 8.5 mg/dL — ABNORMAL LOW (ref 8.9–10.3)
Chloride: 99 mmol/L (ref 98–111)
Creatinine, Ser: 0.8 mg/dL (ref 0.61–1.24)
GFR, Estimated: >60 mL/min (ref >60)
Glucose, Bld: 246 mg/dL — ABNORMAL HIGH (ref 70–99)
Potassium: 3.7 mmol/L (ref 3.5–5.1)
Sodium: 134 mmol/L — ABNORMAL LOW (ref 135–145)

## 2021-01-22 LAB — GLUCOSE, CAPILLARY
Glucose-Capillary: 244 mg/dL — ABNORMAL HIGH (ref 70–99)
Glucose-Capillary: 264 mg/dL — ABNORMAL HIGH (ref 70–99)
Glucose-Capillary: 325 mg/dL — ABNORMAL HIGH (ref 70–99)
Glucose-Capillary: 233 mg/dL — ABNORMAL HIGH (ref 70–99)

## 2021-01-22 MED ORDER — INSULIN GLARGINE-YFGN 100 UNIT/ML ~~LOC~~ SOLN
16.0000 [IU] | Freq: Two times a day (BID) | SUBCUTANEOUS | Status: DC
  Administered 2021-01-22 – 2021-01-26 (×8): 16 [IU] via SUBCUTANEOUS
  Filled 2021-01-22 (×9): qty 0.16

## 2021-01-22 NOTE — Evaluation (Signed)
Physical Therapy Evaluation Patient Details Name: Troy [name] MRN: [id number] DOB: [birth year] Today's Date: 01/22/2021   History of Present Illness  Troy [name] is a 60 y/o male admitted on 01/21/21 with the diagnosis of L L:E Osteomyelitis, Non healing ulcer left leg. Pt now s/p Left Below Knee Amptuation. 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 For the past couple months pt reports difficulty ambulating and using w/c or scooter , but prior to that could ambulate with prosthesis and RW.  He has 24 hr support from his wife.  Today, pt was limited due to pain despite IV pain meds prior to session. He required mod A for transfers and to stand but was unable to take any steps or pivot due to pain.  Pt with decreased safety awareness and requiring frequent cues.  He was unsteady at EOB requiring UE support.  Some limitations due to pain, but if does not improve will need CIR (noted CIR referral)or SNF at d/c. Pt reports would rather go home and do therapy - discussed will see how he can progress as pain is controlled but  at this time from PT perspective not safe to return home.  Pt currently with functional limitations due to the deficits listed below (see PT Problem List). Pt will benefit from skilled PT to increase their independence and safety with mobility to allow discharge to the venue listed below.       Follow Up Recommendations CIR (Pt reports would rather return home - if home strong 24 hr support , EMS transport, and max HH services)    Equipment Recommendations  None recommended by PT    Recommendations for Other Services       Precautions / Restrictions Precautions Precautions: Fall;Other (comment) Precaution Comments: wound VAC Restrictions Weight Bearing Restrictions: Yes LLE Weight Bearing: Non weight bearing      Mobility  Bed Mobility Overal bed mobility: Needs Assistance Bed Mobility:  Supine to Sit;Sit to Supine     Supine to sit: Mod assist Sit to supine: Mod assist   General bed mobility comments: Increased time with heavy use of bed rail to scoot and pull trunk    Transfers Overall transfer level: Needs assistance Equipment used: Rolling walker (2 wheeled) Transfers: Sit to/from Stand Sit to Stand: Mod assist;From elevated surface         General transfer comment: Mod A to stand and stedy from elevated surface with cues for safe hand placement.  Attempted pivots or hops on R LE but too painful and uanble  Ambulation/Gait             General Gait Details: unable  Stairs            Wheelchair Mobility    Modified Rankin (Stroke Patients Only)       Balance Overall balance assessment: Needs assistance Sitting-balance support: Bilateral upper extremity supported;Single extremity supported Sitting balance-Leahy Scale: Poor Sitting balance - Comments: Requiring at least single UE support with tendency to lean R   Standing balance support: Bilateral upper extremity supported Standing balance-Leahy Scale: Poor Standing balance comment: Requiring RW and min A once stnading for static stand                             Pertinent Vitals/Pain Pain Assessment: 0-10 Pain Score: 8  Pain Location: L residual limp Pain  Descriptors / Indicators: Aching;Grimacing;Guarding;Sore;Moaning Pain Intervention(s): Limited activity within patient's tolerance;Monitored during session;Premedicated before session    Home Living Family/patient expects to be discharged to:: Private residence Living Arrangements: Spouse/significant other Available Help at Discharge: Family;Available 24 hours/day Type of Home: House Home Access: Ramped entrance     Home Layout: One level Home Equipment: Walker - 2 wheels;Bedside commode;Shower seat;Wheelchair - Press photographer      Prior Function Level of Independence: Independent with assistive  device(s)   Gait / Transfers Assistance Needed: Pt reports hasn't walked in a couple months due to sore on L LE; prior to that he could ambulate with RW community distances states with prosthesis  ADL's / Homemaking Assistance Needed: Ind with ADLs  Comments: Recently using scooter and  w/c     Hand Dominance   Dominant Hand: Right    Extremity/Trunk Assessment   Upper Extremity Assessment Upper Extremity Assessment: Overall WFL for tasks assessed    Lower Extremity Assessment Lower Extremity Assessment: LLE deficits/detail;RLE deficits/detail RLE Deficits / Details: ROM WFL; MMT 4+/5 LLE Deficits / Details: New AKA, expected post op changes.  Tending to hold in flexion but with encouragement able to reach near neutral hip position    Cervical / Trunk Assessment Cervical / Trunk Assessment: Normal  Communication   Communication: No difficulties  Cognition Arousal/Alertness: Awake/alert Behavior During Therapy: WFL for tasks assessed/performed Overall Cognitive Status: Impaired/Different from baseline Area of Impairment: Attention;Memory;Problem solving;Safety/judgement                   Current Attention Level: Selective Memory: Decreased short-term memory   Safety/Judgement: Decreased awareness of deficits;Decreased awareness of safety   Problem Solving: Slow processing;Decreased initiation;Difficulty sequencing;Requires verbal cues;Requires tactile cues General Comments: .      General Comments General comments (skin integrity, edema, etc.): Educated on AROM L hip and resting in neutral position (not flexion).    Exercises Amputee Exercises Hip ABduction/ADduction: AROM;5 reps;Left;Supine Straight Leg Raises: AROM;Left;5 reps;Supine Other Exercises Other Exercises: gentle hip flexor stretch to near neutral with HOB flat   Assessment/Plan    PT Assessment Patient needs continued PT services  PT Problem List Decreased strength;Decreased  mobility;Decreased safety awareness;Decreased range of motion;Decreased knowledge of precautions;Decreased activity tolerance;Decreased cognition;Decreased balance;Decreased knowledge of use of DME;Pain       PT Treatment Interventions DME instruction;Therapeutic activities;Gait training;Patient/family education;Therapeutic exercise;Functional mobility training;Wheelchair mobility training;Balance training    PT Goals (Current goals can be found in the Care Plan section)  Acute Rehab PT Goals Patient Stated Goal: less pain, go home, smoke PT Goal Formulation: With patient Time For Goal Achievement: 02/05/21 Potential to Achieve Goals: Good    Frequency Min 4X/week   Barriers to discharge        Co-evaluation               AM-PAC PT "6 Clicks" Mobility  Outcome Measure Help needed turning from your back to your side while in a flat bed without using bedrails?: A Little Help needed moving from lying on your back to sitting on the side of a flat bed without using bedrails?: A Lot Help needed moving to and from a bed to a chair (including a wheelchair)?: Total Help needed standing up from a chair using your arms (e.g., wheelchair or bedside chair)?: A Lot Help needed to walk in hospital room?: Total Help needed climbing 3-5 steps with a railing? : Total 6 Click Score: 10    End of Session Equipment Utilized During Treatment:  Gait belt Activity Tolerance: Patient limited by pain Patient left: in bed;with call bell/phone within reach;with bed alarm set;with SCD's reapplied Nurse Communication: Mobility status (pt's bed not functioning correctly) PT Visit Diagnosis: Unsteadiness on feet (R26.81);Muscle weakness (generalized) (M62.81);Pain Pain - Right/Left: Left Pain - part of body: Hip    Time: 1540-1600 PT Time Calculation (min) (ACUTE ONLY): 20 min   Charges:   PT Evaluation $PT Eval Moderate Complexity: 1 Melina Schools, PT Acute Rehab Services Pager  [phone] [facility] [phone]   Karlton Lemon 01/22/2021, 4:21 PM

## 2021-01-22 NOTE — Progress Notes (Signed)
Patient ID: Troy [name], male   DOB: [birth year], 60 y.o.   MRN: [id number] Patient is postoperative day 1 left above-the-knee amputation.  The wound VAC is functioning well.  Patient was independent at home with a left below the knee amputation without a prosthesis and anticipate patient could discharge to home tomorrow Friday.  Continue with the wound VAC.  Patient has no complaints this morning.

## 2021-01-22 NOTE — Progress Notes (Signed)
PT Cancellation Note  Patient Details Name: Troy [name] MRN: [id number] DOB: 06-17-1961   Cancelled Treatment:    Reason Eval/Treat Not Completed: Pain limiting ability to participate. Pt had just finished up with the OT, received some IV pain medication and reporting he was in too much pain to attempt PT evaluation. PT will continue to f/u with pt acutely as available and appropriate.    Clearnce Sorrel Nikai Quest 01/22/2021, 12:08 PM

## 2021-01-22 NOTE — Evaluation (Addendum)
Occupational Therapy Evaluation Patient Details Name: Troy [name] MRN: [id number] DOB: June 17, 1961 Today's Date: 01/22/2021    History of Present Illness TREYSEN SUDBECK is a 60 y/o male with the diagnosis of L L:E Osteomyelitis, Non healing ulcer left leg. Pt now s/p Left Below Knee Amptuation.   Clinical Impression   Pt presents with decline in function and safety with ADLs and ADL mobility with impaired balance and endurance; limited by pain (pan meds give by RN through IV). PTA pt lived at home with his wife and was using a scooter and w/c for mobility and was Ind with ADLs/selfcare and toileting. Pt currently requires mod - max A for bed mobility to sit EOB, min guard A with grooming, min A with UB ADLs, max A with LB ADLs and unable to attempt sit - stand or SPTs due to pain and fatigue    Follow Up Recommendations  CIR (Home health OT if unable to go to CIR, most likely will refuse SNF)   Equipment Recommendations  Other (comment) (reacher)    Recommendations for Other Services       Precautions / Restrictions Precautions Precautions: Fall;Other (comment) Precaution Comments: wound VAC Restrictions Weight Bearing Restrictions: Yes LLE Weight Bearing: Non weight bearing      Mobility Bed Mobility Overal bed mobility: Needs Assistance Bed Mobility: Supine to Sit;Sit to Supine     Supine to sit: Mod assist Sit to supine: Max assist   General bed mobility comments: mod A to bring hips to EOB using pads underneath pt, max A with LEs back onto bed    Transfers                 General transfer comment: unable due to pain and fatigue.    Balance Overall balance assessment: Needs assistance Sitting-balance support: Bilateral upper extremity supported Sitting balance-Leahy Scale: Fair                                     ADL either performed or assessed with clinical judgement   ADL Overall ADL's : Needs assistance/impaired Eating/Feeding: Set  up;Independent;Sitting   Grooming: Wash/dry hands;Wash/dry face;Min guard;Sitting   Upper Body Bathing: Minimal assistance;Sitting   Lower Body Bathing: Maximal assistance   Upper Body Dressing : Minimal assistance;Sitting   Lower Body Dressing: Maximal assistance     Toilet Transfer Details (indicate cue type and reason): unable to attempt at this time due to pain Toileting- Clothing Manipulation and Hygiene: Total assistance;Bed level         General ADL Comments: Pt sat EOB after 2 attempts, unable to attempt sit - stand or SPTs due to pain and fatigue     Vision Patient Visual Report: No change from baseline       Perception     Praxis      Pertinent Vitals/Pain Pain Assessment: 0-10 Pain Score: 8  Pain Descriptors / Indicators: Aching;Grimacing;Guarding;Sore;Moaning Pain Intervention(s): Limited activity within patient's tolerance;Monitored during session;RN gave pain meds during session;Repositioned     Hand Dominance Right   Extremity/Trunk Assessment Upper Extremity Assessment Upper Extremity Assessment: Overall WFL for tasks assessed   Lower Extremity Assessment Lower Extremity Assessment: Defer to PT evaluation       Communication Communication Communication: No difficulties   Cognition Arousal/Alertness: Awake/alert;Suspect due to medications Behavior During Therapy: [facility] for tasks assessed/performed Overall Cognitive Status: Impaired/Different from baseline Area of Impairment: Attention;Memory;Problem solving  Memory: Decreased short-term memory       Problem Solving: Slow processing;Decreased initiation;Difficulty sequencing;Requires verbal cues;Requires tactile cues General Comments: Pt asking for pain meds not recalliong RN just gave pian meds through IV a few minutes prior   General Comments       Exercises     Shoulder Instructions      Home Living Family/patient expects to be discharged to:: Private  residence Living Arrangements: Spouse/significant other Available Help at Discharge: Family;Available 24 hours/day Type of Home: House Home Access: Ramped entrance     Home Layout: One level     Bathroom Shower/Tub: Occupational psychologist: Standard     Home Equipment: Environmental consultant - 2 wheels;Bedside commode;Shower seat;Wheelchair - manual          Prior Functioning/Environment Level of Independence: Independent with assistive device(s)        Comments: pt uses scooter and w/c for mobility, Ind with ADLs/selfcare        OT Problem List: Impaired balance (sitting and/or standing);Decreased safety awareness;Decreased activity tolerance;Pain      OT Treatment/Interventions: Self-care/ADL training;Patient/family education;Therapeutic exercise;Balance training;Therapeutic activities    OT Goals(Current goals can be found in the care plan section) Acute Rehab OT Goals Patient Stated Goal: less pain, go home OT Goal Formulation: With patient/family Time For Goal Achievement: 02/05/21 Potential to Achieve Goals: Good ADL Goals Pt Will Perform Grooming: with supervision;with set-up;with caregiver independent in assisting;sitting Pt Will Perform Upper Body Bathing: with min guard assist;with supervision;with caregiver independent in assisting;sitting Pt Will Perform Lower Body Bathing: with mod assist;with min assist;sitting/lateral leans;with caregiver independent in assisting Pt Will Perform Upper Body Dressing: with min guard assist;with supervision;sitting Pt Will Transfer to Toilet: with max assist;with mod assist;stand pivot transfer;bedside commode Additional ADL Goal #1: Pt will complete bed mobility min A to sit EOB for functional tasks  OT Frequency: Min 2X/week   Barriers to D/C:            Co-evaluation              AM-PAC OT "6 Clicks" Daily Activity     Outcome Measure Help from another person eating meals?: None Help from another person taking  care of personal grooming?: A Little Help from another person toileting, which includes using toliet, bedpan, or urinal?: A Lot Help from another person bathing (including washing, rinsing, drying)?: A Lot Help from another person to put on and taking off regular upper body clothing?: A Little Help from another person to put on and taking off regular lower body clothing?: A Lot 6 Click Score: 16   End of Session    Activity Tolerance: Patient limited by fatigue;Patient limited by pain Patient left: in bed;with call bell/phone within reach;with bed alarm set;with family/visitor present  OT Visit Diagnosis: Other abnormalities of gait and mobility (R26.89);Pain Pain - Right/Left: Left Pain - part of body: Leg (residual limb)                Time: 7902-4097 OT Time Calculation (min): 26 min Charges:  OT General Charges $OT Visit: 1 Visit OT Evaluation $OT Eval Moderate Complexity: 1 Mod OT Treatments $Therapeutic Activity: 8-22 mins    Britt Bottom 01/22/2021, 2:42 PM

## 2021-01-22 NOTE — Progress Notes (Signed)
IP rehab admissions - Awaiting PT/OT evals and recommendations.  However it is unlikely that this patient would be approved for inpatient rehab by his insurance carrier.  I will follow up once he is seen by PT/OT.  Call for questions.  [phone]

## 2021-01-22 NOTE — Progress Notes (Signed)
Inpatient Diabetes Program Recommendations  AACE/ADA: New Consensus Statement on Inpatient Glycemic Control (2015)  Target Ranges:  Prepandial:   less than 140 mg/dL      Peak postprandial:   less than 180 mg/dL (1-2 hours)      Critically ill patients:  140 - 180 mg/dL   Lab Results  Component Value Date   GLUCAP 264 (H) 01/22/2021   HGBA1C 12.3 (H) 01/21/2021    Review of Glycemic Control  CBGs today 244, 264 mg/dL. Needs insulin titration.  Current: Semglee 16 units BID, Novolog 0-15 units TID with meals and 0-5 HS, Trulicity 1.5 mg Q Sunday  Inpatient Diabetes Program Recommendations:    Add Novolog 4 units TID with meals if eating > 50%.  Spoke with pt at bedside regarding HgbA1C of 12.3%. Pt states his wife gives his insulin at home each day at 1 pm. Michela Pitcher he occasionally checks blood sugars. Discussed diet and pt states he does drink sweet tea and eats cookies. We talked about importance of controlling blood sugars to prevent complications such as wounds not healing. Pt said he goes to his doctor once a month, although records do not indicate. Tried to set small goals with pt like checking blood sugars at least 2x/day and calling MD if blood sugars are over 200 mg/dL on a regular basis. Encouraged pt to take charge of his diabetes and give his own injections. Willing to make some dietary changes.   Continue to follow.  Thank you. Lorenda Peck, RD, LDN, CDE Inpatient Diabetes Coordinator [phone]

## 2021-01-23 LAB — CBC
HCT: 37.5 % — ABNORMAL LOW (ref 39.0–52.0)
Hemoglobin: 13 g/dL (ref 13.0–17.0)
MCH: 29.3 pg (ref 26.0–34.0)
MCHC: 34.7 g/dL (ref 30.0–36.0)
MCV: 84.5 fL (ref 80.0–100.0)
Platelets: 133 K/uL — ABNORMAL LOW (ref 150–400)
RBC: 4.44 MIL/uL (ref 4.22–5.81)
RDW: 13.1 % (ref 11.5–15.5)
WBC: 12.6 K/uL — ABNORMAL HIGH (ref 4.0–10.5)
nRBC: 0 % (ref 0.0–0.2)

## 2021-01-23 LAB — GLUCOSE, CAPILLARY
Glucose-Capillary: 244 mg/dL — ABNORMAL HIGH (ref 70–99)
Glucose-Capillary: 292 mg/dL — ABNORMAL HIGH (ref 70–99)
Glucose-Capillary: 219 mg/dL — ABNORMAL HIGH (ref 70–99)
Glucose-Capillary: 301 mg/dL — ABNORMAL HIGH (ref 70–99)

## 2021-01-23 LAB — BASIC METABOLIC PANEL
Anion gap: 9 (ref 5–15)
BUN: 6 mg/dL (ref 6–20)
CO2: 25 mmol/L (ref 22–32)
Calcium: 8.4 mg/dL — ABNORMAL LOW (ref 8.9–10.3)
Chloride: 100 mmol/L (ref 98–111)
Creatinine, Ser: 0.45 mg/dL — ABNORMAL LOW (ref 0.61–1.24)
GFR, Estimated: >60 mL/min (ref >60)
Glucose, Bld: 203 mg/dL — ABNORMAL HIGH (ref 70–99)
Potassium: 3.3 mmol/L — ABNORMAL LOW (ref 3.5–5.1)
Sodium: 134 mmol/L — ABNORMAL LOW (ref 135–145)

## 2021-01-23 MED ORDER — TRAZODONE HCL 100 MG PO TABS
100.0000 mg | ORAL_TABLET | Freq: Every day | ORAL | Status: DC
  Administered 2021-01-23 – 2021-02-01 (×10): 100 mg via ORAL
  Filled 2021-01-23 (×10): qty 1

## 2021-01-23 MED ORDER — TIZANIDINE HCL 2 MG PO TABS
4.0000 mg | ORAL_TABLET | Freq: Every day | ORAL | Status: DC
  Administered 2021-01-23 – 2021-02-01 (×10): 4 mg via ORAL
  Filled 2021-01-23 (×10): qty 2

## 2021-01-23 NOTE — Progress Notes (Signed)
Inpatient Rehab Admissions Coordinator:   I spoke with Pt.'s wife regarding referral to CIR. I let her know that Pt.'s Medicare Advantage plan is very unlikely to approve CIR, as they typically will not approve inpatient rehab following amputation surgeries. She states understanding but feels Pt. Will refuse SNF. She states that she cannot manage him at his current level of assist, as she is on disability herself. Pt. Just received pain meds and she requests that I return to discuss with him tomorrow morning.   Clemens Catholic, JAARS, [facility] Admissions Coordinator  [phone] ([place]) [phone] (office)

## 2021-01-23 NOTE — Progress Notes (Addendum)
Inpatient Diabetes Program Recommendations  AACE/ADA: New Consensus Statement on Inpatient Glycemic Control (2015)  Target Ranges:  Prepandial:   less than 140 mg/dL      Peak postprandial:   less than 180 mg/dL (1-2 hours)      Critically ill patients:  140 - 180 mg/dL   Lab Results  Component Value Date   GLUCAP 244 (H) 01/23/2021   HGBA1C 12.3 (H) 01/21/2021    Review of Glycemic Control Home medications: Farxiga/metformin combo 0-3524 mg Daily, Trulicity 1.5 mg QSunday, Tresiba 24 units bid  Current: Semglee 16 units BID, Novolog 0-15 units TID with meals and 0-5 HS, Trulicity 1.5 mg Q Sunday  Inpatient Diabetes Program Recommendations:    -  Increase Semglee closer to home dose of basal to 20 units bid  -  Add Novolog 3 units TID with meals if eating > 50%   Tama Headings RN, MSN, BC-ADM Inpatient Diabetes Coordinator Team Pager [phone] (8a-5p)

## 2021-01-23 NOTE — Progress Notes (Signed)
Occupational Therapy Treatment Patient Details Name: RODRECUS BELSKY MRN: [id number] DOB: July 12, 1960 Today's Date: 01/23/2021    History of present illness Troy [name] is a 60 y/o male admitted on 01/21/21 with the diagnosis of L LE Osteomyelitis, Non healing ulcer left leg. Pt now s/p Left Below Knee Amptuation. 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 Able to tolerate more activity today and with reports of less pain. Session focused on bed mobility to sit EOB, UB dressing, grooming, simulated LB ADLs, sit - stand from EOB to RW. Pt unable to SPT to recliner at this time. Pt would benefit from intense therapy follow up at CIR level to maximize functional independence prior to returning home. If pt is declined from CIR he may need SNF if he does not progress during acute stay.  Follow Up Recommendations  CIR (may need SNF but is refusing, Max HH therapies if this remains to be the case if CIR denied)    Equipment Recommendations  None recommended by OT    Recommendations for Other Services      Precautions / Restrictions Precautions Precautions: Fall;Other (comment) Precaution Comments: wound VAC Restrictions Weight Bearing Restrictions: Yes LLE Weight Bearing: Non weight bearing       Mobility Bed Mobility Overal bed mobility: Needs Assistance Bed Mobility: Supine to Sit;Sit to Supine     Supine to sit: HOB elevated;Min assist Sit to supine: Min assist   General bed mobility comments: cues to use bed rail, increased time and assist to press turnk up. min A with LE mgt    Transfers Overall transfer level: Needs assistance Equipment used: Standard walker Transfers: Sit to/from Stand Sit to Stand: Mod assist;From elevated surface         General transfer comment: cues for hand placement    Balance Overall balance assessment: Needs assistance Sitting-balance support: Bilateral upper  extremity supported;Single extremity supported Sitting balance-Leahy Scale: Fair     Standing balance support: Bilateral upper extremity supported Standing balance-Leahy Scale: Poor Standing balance comment: Requiring RW and min A once stnading for static stand                           ADL either performed or assessed with clinical judgement   ADL Overall ADL's : Needs assistance/impaired     Grooming: Wash/dry hands;Wash/dry face;Min guard;Sitting           Upper Body Dressing : Min guard;Sitting   Lower Body Dressing: Moderate assistance;Sitting/lateral leans Lower Body Dressing Details (indicate cue type and reason): simulated sitting EOB               General ADL Comments: sit -  stand from EOB to RW but unable to pivot to recliner this session     Vision Patient Visual Report: No change from baseline     Perception     Praxis      Cognition Arousal/Alertness: Awake/alert Behavior During Therapy: WFL for tasks assessed/performed Overall Cognitive Status: Impaired/Different from baseline Area of Impairment: Attention;Safety/judgement;Problem solving;Memory                   Current Attention Level: Selective Memory: Decreased short-term memory   Safety/Judgement: Decreased awareness of deficits;Decreased awareness of safety   Problem Solving: Requires verbal cues;Difficulty sequencing;Slow processing General Comments: pt somewhat irritable today and required encouragement and education on benefits  of mobilizing. pt slightly impulsive with gait training after standing and required cues for safety. pt with little patience to wait for spouse to move recliner in safe place to sit after gait.        Exercises Exercises: Amputee Amputee Exercises Gluteal Sets: AROM;Both;5 reps;Seated Hip Extension: AROM;5 reps;Left;Seated Hip ABduction/ADduction: AROM;5 reps;Left;Seated Other Exercises Other Exercises: educated on spending time on  stomach to provide gentle hip flexor stretch to improve hip ROM.   Shoulder Instructions       General Comments      Pertinent Vitals/ Pain       Pain Assessment: Faces Faces Pain Scale: Hurts even more Pain Location: L residual limp Pain Descriptors / Indicators: Aching;Grimacing;Guarding;Sore;Moaning Pain Intervention(s): Monitored during session;Limited activity within patient's tolerance;Patient requesting pain meds-RN notified  Home Living                                          Prior Functioning/Environment              Frequency  Min 2X/week        Progress Toward Goals  OT Goals(current goals can now be found in the care plan section)  Progress towards OT goals: Progressing toward goals  Acute Rehab OT Goals Patient Stated Goal: less pain, go home  Plan Discharge plan needs to be updated;Frequency remains appropriate    Co-evaluation                 AM-PAC OT "6 Clicks" Daily Activity     Outcome Measure   Help from another person eating meals?: None Help from another person taking care of personal grooming?: A Little Help from another person toileting, which includes using toliet, bedpan, or urinal?: A Lot Help from another person bathing (including washing, rinsing, drying)?: A Lot Help from another person to put on and taking off regular upper body clothing?: A Little Help from another person to put on and taking off regular lower body clothing?: A Lot 6 Click Score: 16    End of Session Equipment Utilized During Treatment: Gait belt;Rolling walker  OT Visit Diagnosis: Other abnormalities of gait and mobility (R26.89);Pain Pain - Right/Left: Left Pain - part of body: Leg   Activity Tolerance Patient limited by fatigue;Patient limited by pain   Patient Left in bed;with call bell/phone within reach;with bed alarm set;with family/visitor present   Nurse Communication          Time: 3435-6861 OT Time Calculation  (min): 26 min  Charges: OT General Charges $OT Visit: 1 Visit OT Treatments $Self Care/Home Management : 8-22 mins $Therapeutic Activity: 8-22 mins    Britt Bottom 01/23/2021, 1:55 PM

## 2021-01-23 NOTE — Progress Notes (Signed)
Physical Therapy Treatment Patient Details Name: Troy [name] MRN: [id number] DOB: July 30, 1960 Today's Date: 01/23/2021    History of Present Illness YECHESKEL KUREK is a 60 y/o male admitted on 01/21/21 with the diagnosis of L LE Osteomyelitis, Non healing ulcer left leg. Pt now s/p Left Below Knee Amptuation. 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 Pt remains limited by Lt limb pain and fatigues quickly only ambulating ~12' in room with mod assist. Pt also completed seated hip exercises and educated on gentle hip flexor stretch by spending some time in prone if able. Pt would benefit from intense therapy follow up at CIR level to maximize functional independence prior to returning home. If pt is declined from CIR he may need SNF if he does not progress during acute stay. Acute PT will continue to advance mobility as able.    Follow Up Recommendations  CIR     Equipment Recommendations  None recommended by PT    Recommendations for Other Services       Precautions / Restrictions Precautions Precautions: Fall;Other (comment) Precaution Comments: wound VAC Restrictions Weight Bearing Restrictions: Yes LLE Weight Bearing: Non weight bearing    Mobility  Bed Mobility Overal bed mobility: Needs Assistance Bed Mobility: Supine to Sit     Supine to sit: Mod assist;HOB elevated     General bed mobility comments: cues to use bed rail, increased time and assist to press turnk up. Pt required assist at [place] to prevent lower trunk from lifting up at EOB while trying to sit up.    Transfers Overall transfer level: Needs assistance Equipment used: Standard walker Transfers: Sit to/from Stand Sit to Stand: Mod assist;From elevated surface         General transfer comment: cues for hand placement to use single UE for power up from EOB. Mod assist to steady on  rise.  Ambulation/Gait Ambulation/Gait assistance: Mod assist Gait Distance (Feet): 12 Feet Assistive device: Standard walker Gait Pattern/deviations: Step-to pattern;Decreased step length - right (hopping) Gait velocity: decr   General Gait Details: pt with hop to pattern on Rt LE. pt able to balance on Rt LE while moving walker forward to advance next step. mod assist for cues tih safety and to direct walker for gait in room. pt fatigued quickly and c/o Lt limb pain.   Stairs             Wheelchair Mobility    Modified Rankin (Stroke Patients Only)       Balance Overall balance assessment: Needs assistance Sitting-balance support: Bilateral upper extremity supported;Single extremity supported Sitting balance-Leahy Scale: Poor     Standing balance support: Bilateral upper extremity supported Standing balance-Leahy Scale: Poor Standing balance comment: Requiring RW and min A once stnading for static stand                            Cognition Arousal/Alertness: Awake/alert Behavior During Therapy: WFL for tasks assessed/performed (mildly irritable) Overall Cognitive Status: Impaired/Different from baseline Area of Impairment: Attention;Safety/judgement;Problem solving;Memory                   Current Attention Level: Selective Memory: Decreased short-term memory   Safety/Judgement: Decreased awareness of deficits;Decreased awareness of safety   Problem Solving: Requires verbal cues;Difficulty sequencing;Slow processing General Comments: pt somewhat  irritable today and required encouragement and education on benefits of mobilizing. pt slightly impulsive with gait training after standing and required cues for safety. pt with little patience to wait for spouse to move recliner in safe place to sit after gait.      Exercises Amputee Exercises Gluteal Sets: AROM;Both;5 reps;Seated Hip Extension: AROM;5 reps;Left;Seated Hip ABduction/ADduction:  AROM;5 reps;Left;Seated Other Exercises Other Exercises: educated on spending time on stomach to provide gentle hip flexor stretch to improve hip ROM.    General Comments        Pertinent Vitals/Pain Pain Assessment: Faces Faces Pain Scale: Hurts even more Pain Location: L residual limp Pain Descriptors / Indicators: Aching;Grimacing;Guarding;Sore;Moaning Pain Intervention(s): Limited activity within patient's tolerance;Monitored during session;Repositioned;Premedicated before session    Home Living                      Prior Function            PT Goals (current goals can now be found in the care plan section) Acute Rehab PT Goals Patient Stated Goal: less pain, go home, smoke PT Goal Formulation: With patient Time For Goal Achievement: 02/05/21 Potential to Achieve Goals: Good Progress towards PT goals: Progressing toward goals    Frequency    Min 4X/week      PT Plan Current plan remains appropriate    Co-evaluation              AM-PAC PT "6 Clicks" Mobility   Outcome Measure  Help needed turning from your back to your side while in a flat bed without using bedrails?: A Little Help needed moving from lying on your back to sitting on the side of a flat bed without using bedrails?: A Lot Help needed moving to and from a bed to a chair (including a wheelchair)?: A Lot Help needed standing up from a chair using your arms (e.g., wheelchair or bedside chair)?: A Lot Help needed to walk in hospital room?: A Lot Help needed climbing 3-5 steps with a railing? : Total 6 Click Score: 12    End of Session Equipment Utilized During Treatment: Gait belt Activity Tolerance: Patient tolerated treatment well Patient left: in chair;with call bell/phone within reach;with family/visitor present Nurse Communication: Mobility status PT Visit Diagnosis: Unsteadiness on feet (R26.81);Muscle weakness (generalized) (M62.81);Pain Pain - Right/Left: Left Pain - part  of body: Hip     Time: 5284-1324 PT Time Calculation (min) (ACUTE ONLY): 27 min  Charges:  $Gait Training: 8-22 mins $Therapeutic Exercise: 8-22 mins                     Verner Mould, DPT Acute Rehabilitation Services Office [phone] Pager [phone]    Jacques Navy 01/23/2021, 1:20 PM

## 2021-01-23 NOTE — Progress Notes (Signed)
Patient is postop day 2 status post above-knee amputation.  Wife is at his bedside.  Had a difficult day with PT OT yesterday.  His wife is concerned about him going home today or tomorrow because of his lack of strength.  Patient says he is "fine "   Vital signs stable hematocrit hemoglobin stable he is alert appropriate to exam.  Wound VAC canister has 1 green check with 0 cc in the canister   Had an extensive discussion with he and his wife.  He was able to maneuver around his home being a below-knee amputee without a prosthetic prior to admission.  We will keep him in the hospital over the weekend and see how he progresses with physical therapy see if inpatient rehab is an option.  Plan for discharge early next week patient and his wife are in agreement with the plan

## 2021-01-24 LAB — GLUCOSE, CAPILLARY
Glucose-Capillary: 229 mg/dL — ABNORMAL HIGH (ref 70–99)
Glucose-Capillary: 176 mg/dL — ABNORMAL HIGH (ref 70–99)
Glucose-Capillary: 242 mg/dL — ABNORMAL HIGH (ref 70–99)
Glucose-Capillary: 257 mg/dL — ABNORMAL HIGH (ref 70–99)

## 2021-01-24 MED ORDER — OXYCODONE HCL 5 MG PO TABS
10.0000 mg | ORAL_TABLET | ORAL | Status: DC | PRN
Start: 2021-01-24 — End: 2021-02-02
  Administered 2021-01-24 – 2021-02-01 (×23): 10 mg via ORAL
  Filled 2021-01-24 (×23): qty 2

## 2021-01-24 MED ORDER — OXYCODONE HCL ER 10 MG PO T12A
10.0000 mg | EXTENDED_RELEASE_TABLET | Freq: Two times a day (BID) | ORAL | Status: DC
  Administered 2021-01-24 – 2021-02-02 (×20): 10 mg via ORAL
  Filled 2021-01-24 (×20): qty 1

## 2021-01-24 NOTE — Progress Notes (Signed)
  Subjective: Patient stable.  Requiring pain medicine at a moderate to high dose.  All his pain medicine is immediate release oxycodone.   Objective: Vital signs in last 24 hours: Temp:  [97.9 F (36.6 C)-98.5 F (36.9 C)] 97.9 F (36.6 C) (08/06 1135) Pulse Rate:  [85-110] 108 (08/06 1135) Resp:  [16-20] 17 (08/06 1135) BP: (115-152)/(72-83) 131/79 (08/06 1135) SpO2:  [95 %-100 %] 100 % (08/06 1135)  Intake/Output from previous day: 08/05 0701 - 08/06 0700 In: 180 [P.O.:180] Out: 1500 [Urine:1500] Intake/Output this shift: No intake/output data recorded.  Exam:  No cellulitis present Compartment soft  Labs: Recent Labs    01/22/21 0014 01/23/21 0144  HGB 12.9* 13.0   Recent Labs    01/22/21 0014 01/23/21 0144  WBC 15.2* 12.6*  RBC 4.51 4.44  HCT 38.6* 37.5*  PLT 176 133*   Recent Labs    01/22/21 0014 01/23/21 0144  NA 134* 134*  K 3.7 3.3*  CL 99 100  CO2 27 25  BUN 15 6  CREATININE 0.80 0.45*  GLUCOSE 246* 203*  CALCIUM 8.5* 8.4*   No results for input(s): LABPT, INR in the last 72 hours.  Assessment/Plan: Plan at this time is to change his medicine to add some oxycodone extended release of is not taking so much of the immediate release.  Wound VAC is functional.  Follow-up next week with Dr. Sharol Given.   Troy [name] 01/24/2021, 11:45 AM

## 2021-01-24 NOTE — Progress Notes (Signed)
Inpatient Rehab Admissions Coordinator:   I spoke with pt. And his wife regarding potential CIR admit. [place] medicare will not approve CIR for amputations, so I recommend SNF for short term rehab. Pt.'s wife is interested in [facility] rehab in [place], [place]. I will sign off and notify TOC.  Clemens Catholic, [place], [place] Admissions Coordinator  [phone] ([place]) [phone] (office)

## 2021-01-25 LAB — GLUCOSE, CAPILLARY
Glucose-Capillary: 265 mg/dL — ABNORMAL HIGH (ref 70–99)
Glucose-Capillary: 365 mg/dL — ABNORMAL HIGH (ref 70–99)
Glucose-Capillary: 208 mg/dL — ABNORMAL HIGH (ref 70–99)
Glucose-Capillary: 188 mg/dL — ABNORMAL HIGH (ref 70–99)

## 2021-01-25 NOTE — Progress Notes (Signed)
Occupational Therapy Treatment Patient Details Name: Troy [name] MRN: [id number] DOB: 1961-06-08 Today's Date: 01/25/2021    History of present illness Troy [name] is a 60 y/o male admitted on 01/21/21 with the diagnosis of L LE Osteomyelitis, Non healing ulcer left leg. Pt now s/p Left Below Knee Amptuation. 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 At this time pt is limited by pain and at times appears to be self limiting. Pt able to complete sit<>stands and transfers with min guard for safety. Worked on these transfers today, to increased activity tolerance and strength in BUE and RLE. Insurance will not cover CIR, discharge recommendations updated to SNF, pt and wife aware. Skilled therapies will be beneficial to pt in order to assist with strengthening, safety, and improving overall functional mobility and ADL performance. OT will continue to follow acutely.   Follow Up Recommendations  SNF    Equipment Recommendations  None recommended by OT    Recommendations for Other Services      Precautions / Restrictions Precautions Precautions: Fall;Other (comment) Precaution Comments: wound VAC Restrictions Weight Bearing Restrictions: Yes LLE Weight Bearing: Non weight bearing       Mobility Bed Mobility Overal bed mobility: Needs Assistance Bed Mobility: Supine to Sit     Supine to sit: Min guard;HOB elevated     General bed mobility comments: cues to use bed rail, increased time and assist to press trunk up.    Transfers Overall transfer level: Needs assistance Equipment used: Rolling walker (2 wheeled) Transfers: Sit to/from [facility] Sit to Stand: Min assist;Min guard Stand pivot transfers: Min assist       General transfer comment: Initial stand and transfer to recliner required min A to power up to standing and to stabilize as pt pivotted to  the chair. Afterwards, when working on sit<>stands from recliner, pt able to complete stands with min guard. Can be self limiting due to pain    Balance Overall balance assessment: Needs assistance Sitting-balance support: Bilateral upper extremity supported;Single extremity supported Sitting balance-Leahy Scale: Fair Sitting balance - Comments: Requiring at least single UE support with tendency to lean R   Standing balance support: Bilateral upper extremity supported Standing balance-Leahy Scale: Poor Standing balance comment: reliant on BUE support                           ADL either performed or assessed with clinical judgement   ADL Overall ADL's : Needs assistance/impaired         Upper Body Bathing: Set up;Sitting   Lower Body Bathing: Maximal assistance;Sitting/lateral leans;Sit to/from stand   Upper Body Dressing : Set up;Sitting       Toilet Transfer: Minimal assistance;Stand-pivot           Functional mobility during ADLs: Minimal assistance;Rolling walker General ADL Comments: Pt requiring verbal cues and max verbal encouragement to participate, limited by pain in residual limb.     Vision   Vision Assessment?: No apparent visual deficits   Perception     Praxis      Cognition Arousal/Alertness: Awake/alert Behavior During Therapy: WFL for tasks assessed/performed Overall Cognitive Status: Impaired/Different from baseline Area of Impairment: Attention;Safety/judgement;Problem solving;Memory                   Current Attention Level: Selective Memory: Decreased short-term  memory   Safety/Judgement: Decreased awareness of deficits;Decreased awareness of safety   Problem Solving: Requires verbal cues;Difficulty sequencing;Slow processing General Comments: pt somewhat irritable today and required encouragement and education on benefits of mobilizing. pt slightly impulsive with gait training after standing and required cues for  safety. pt with little patience to wait for bed to be changed and spouse to assist with bath        Exercises Other Exercises Other Exercises: sit<>stand x5   Shoulder Instructions       General Comments VSS, wound vac in place with no noted drainage at this time.    Pertinent Vitals/ Pain       Pain Assessment: 0-10 Pain Score: 8  Pain Location: L residual limp Pain Descriptors / Indicators: Aching;Grimacing;Guarding;Sore;Moaning Pain Intervention(s): Limited activity within patient's tolerance;Monitored during session;Repositioned  Home Living                                          Prior Functioning/Environment              Frequency  Min 2X/week        Progress Toward Goals  OT Goals(current goals can now be found in the care plan section)  Progress towards OT goals: Progressing toward goals  Acute Rehab OT Goals Patient Stated Goal: less pain, go home OT Goal Formulation: With patient/family Time For Goal Achievement: 02/05/21 Potential to Achieve Goals: Good ADL Goals Pt Will Perform Grooming: with supervision;with set-up;with caregiver independent in assisting;sitting Pt Will Perform Upper Body Bathing: with min guard assist;with supervision;with caregiver independent in assisting;sitting Pt Will Perform Lower Body Bathing: with mod assist;with min assist;sitting/lateral leans;with caregiver independent in assisting Pt Will Perform Upper Body Dressing: with min guard assist;with supervision;sitting Pt Will Transfer to Toilet: with min guard assist;stand pivot transfer Additional ADL Goal #1: Pt will complete bed mobility independently to sit EOB for functional tasks  Plan Discharge plan remains appropriate;Frequency remains appropriate    Co-evaluation                 AM-PAC OT "6 Clicks" Daily Activity     Outcome Measure   Help from another person eating meals?: None Help from another person taking care of personal  grooming?: A Little Help from another person toileting, which includes using toliet, bedpan, or urinal?: A Lot Help from another person bathing (including washing, rinsing, drying)?: A Lot Help from another person to put on and taking off regular upper body clothing?: A Little Help from another person to put on and taking off regular lower body clothing?: A Lot 6 Click Score: 16    End of Session Equipment Utilized During Treatment: Gait belt;Rolling walker  OT Visit Diagnosis: Unsteadiness on feet (R26.81);Other abnormalities of gait and mobility (R26.89);Muscle weakness (generalized) (M62.81);Pain Pain - Right/Left: Left Pain - part of body: Leg   Activity Tolerance Patient limited by pain   Patient Left in chair;with call bell/phone within reach;with family/visitor present   Nurse Communication Mobility status        Time: 2876-8115 OT Time Calculation (min): 18 min  Charges: OT General Charges $OT Visit: 1 Visit OT Treatments $Therapeutic Activity: 8-22 mins  Liberti Appleton H., OTR/L Acute Rehabilitation  Kissa Campoy Elane Darica Goren 01/25/2021, 10:14 AM

## 2021-01-26 LAB — GLUCOSE, CAPILLARY
Glucose-Capillary: 227 mg/dL — ABNORMAL HIGH (ref 70–99)
Glucose-Capillary: 227 mg/dL — ABNORMAL HIGH (ref 70–99)
Glucose-Capillary: 222 mg/dL — ABNORMAL HIGH (ref 70–99)
Glucose-Capillary: 183 mg/dL — ABNORMAL HIGH (ref 70–99)

## 2021-01-26 LAB — SURGICAL PATHOLOGY

## 2021-01-26 LAB — SARS CORONAVIRUS 2 (TAT 6-24 HRS): SARS Coronavirus 2: NEGATIVE

## 2021-01-26 MED ORDER — JUVEN PO PACK: 1.0000 | PACK | Freq: Two times a day (BID) | ORAL | 0 refills | Status: DC

## 2021-01-26 MED ORDER — OXYCODONE-ACETAMINOPHEN 10-325 MG PO TABS: 1.0000 | ORAL_TABLET | ORAL | 0 refills | Status: DC | PRN

## 2021-01-26 MED ORDER — INSULIN GLARGINE-YFGN 100 UNIT/ML ~~LOC~~ SOLN
20.0000 [IU] | Freq: Two times a day (BID) | SUBCUTANEOUS | Status: DC
  Administered 2021-01-26 – 2021-02-02 (×14): 20 [IU] via SUBCUTANEOUS
  Filled 2021-01-26 (×15): qty 0.2

## 2021-01-26 MED ORDER — ZINC SULFATE 220 (50 ZN) MG PO CAPS: 220.0000 mg | ORAL_CAPSULE | Freq: Every day | ORAL | Status: DC

## 2021-01-26 MED ORDER — ASCORBIC ACID 1000 MG PO TABS: 1000.0000 mg | ORAL_TABLET | Freq: Every day | ORAL | Status: DC

## 2021-01-26 MED ORDER — INSULIN ASPART 100 UNIT/ML IJ SOLN
3.0000 [IU] | Freq: Three times a day (TID) | INTRAMUSCULAR | Status: DC
  Administered 2021-01-26 – 2021-02-02 (×21): 3 [IU] via SUBCUTANEOUS

## 2021-01-26 NOTE — TOC Progression Note (Signed)
Transition of Care [facility]) - Progression Note    Patient Details  Name: Troy [name] MRN: [id number] Date of Birth: Aug 28, 1960  [facility]) CM/SW Contact  Emeterio Reeve, [place] Phone Number: 01/26/2021, 3:45 PM  Clinical Narrative:     CSW spoke to pt about SNF options. Pt is agreeable at this time. CSW faxed out to facilities in the area.       Expected Discharge Plan and Services           Expected Discharge Date: 01/26/21                                     Social Determinants of Health (SDOH) Interventions    Readmission Risk Interventions No flowsheet data found.  Emeterio Reeve, Latanya Presser, [place] Social Worker [phone]

## 2021-01-26 NOTE — Care Management Important Message (Signed)
Important Message  Patient Details  Name: Troy [name] MRN: [id number] Date of Birth: September 29, 1960   Medicare Important Message Given:  Yes     [name] 01/26/2021, 4:49 PM

## 2021-01-26 NOTE — Progress Notes (Signed)
Physical Therapy Treatment Patient Details Name: KYCE GING MRN: [id number] DOB: 09/17/1960 Today's Date: 01/26/2021    History of Present Illness Troy [name] is a 60 y/o male admitted on 01/21/21 with the diagnosis of L LE Osteomyelitis, Non healing ulcer left leg. Pt now s/p Above Below Knee Amptuation. 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  He reports pain in L LE as 8/10 despite sleeping soundly.  Pt continues to benefit from skilled rehab in a post acute setting as he recovers.  Will update recommendations to SNF based on CIR denial.  Will inform supervising PT of need for change in recommendations.    Follow Up Recommendations  SNF     Equipment Recommendations  None recommended by PT    Recommendations for Other Services       Precautions / Restrictions Precautions Precautions: Fall;Other (comment) Precaution Comments: wound VAC Restrictions Weight Bearing Restrictions: Yes LLE Weight Bearing: Non weight bearing    Mobility  Bed Mobility Overal bed mobility: Needs Assistance Bed Mobility: Supine to Sit     Supine to sit: Mod assist     General bed mobility comments: Pt with poor trunk control.  Pt required assistance to advance LE to edge of bed.  Pt with posterior LOB.    Transfers Overall transfer level: Needs assistance Equipment used: Standard walker Transfers: Sit to/from [employer] Transfers Sit to Stand: Mod assist;Min assist (performed several trials required decreased assistance with each attempt.)         General transfer comment: Heavy mod assistance to move into standing from elevated bed,  Performed additional trials.  Pt then performed stand pivot from bed to recliner.  Ambulation/Gait Ambulation/Gait assistance: Mod assist Gait Distance (Feet): 4 Feet Assistive device: Standard walker Gait Pattern/deviations: Step-to pattern;Decreased step  length - right (hop to pattern with assistance to turn RW.)     General Gait Details: Pt refused further gt distance.  Performed step to pattern to turn and sit in recliner.   Stairs             Wheelchair Mobility    Modified Rankin (Stroke Patients Only)       Balance Overall balance assessment: Needs assistance Sitting-balance support: Bilateral upper extremity supported;Single extremity supported Sitting balance-Leahy Scale: Fair       Standing balance-Leahy Scale: Poor Standing balance comment: reliant on BUE support                            Cognition Arousal/Alertness: Awake/alert Behavior During Therapy: WFL for tasks assessed/performed Overall Cognitive Status: Impaired/Different from baseline Area of Impairment: Safety/judgement;Problem solving                         Safety/Judgement: Decreased awareness of deficits;Decreased awareness of safety   Problem Solving: Requires verbal cues;Difficulty sequencing;Slow processing General Comments: Pt required max cues for encouragement.      Exercises General Exercises - Lower Extremity Straight Leg Raises: AROM;10 reps;Supine;Right Amputee Exercises Hip Extension: AROM;10 reps;Left;Sidelying Hip ABduction/ADduction: AROM;Left;10 reps;Sidelying Straight Leg Raises: AROM;Left;10 reps;Supine    General Comments        Pertinent Vitals/Pain Pain Assessment: 0-10 Pain Score: 8  (but sleeping soundly on arrival.) Pain Location: L residual limp Pain Descriptors / Indicators: Aching;Grimacing;Guarding;Sore;Moaning Pain Intervention(s): Monitored during session;Repositioned  Home Living                      Prior Function            PT Goals (current goals can now be found in the care plan section) Acute Rehab PT Goals Patient Stated Goal: less pain, go home Potential to Achieve Goals: Good Progress towards PT goals: Progressing toward goals    Frequency     Min 4X/week      PT Plan Current plan remains appropriate    Co-evaluation              AM-PAC PT "6 Clicks" Mobility   Outcome Measure  Help needed turning from your back to your side while in a flat bed without using bedrails?: A Lot Help needed moving from lying on your back to sitting on the side of a flat bed without using bedrails?: A Lot Help needed moving to and from a bed to a chair (including a wheelchair)?: A Lot Help needed standing up from a chair using your arms (e.g., wheelchair or bedside chair)?: A Lot Help needed to walk in hospital room?: A Lot Help needed climbing 3-5 steps with a railing? : A Lot 6 Click Score: 12    End of Session Equipment Utilized During Treatment: Gait belt Activity Tolerance: Patient tolerated treatment well Patient left: in chair;with call bell/phone within reach;with family/visitor present;with chair alarm set Nurse Communication: Mobility status PT Visit Diagnosis: Unsteadiness on feet (R26.81);Muscle weakness (generalized) (M62.81);Pain Pain - Right/Left: Left Pain - part of body: Hip     Time: 5465-6812 PT Time Calculation (min) (ACUTE ONLY): 23 min  Charges:  $Therapeutic Exercise: 8-22 mins $Therapeutic Activity: 8-22 mins                     Erasmo Leventhal , PTA Acute Rehabilitation Services Pager [phone] Office [phone]    Chantay Whitelock Eli Hose 01/26/2021, 10:56 AM

## 2021-01-26 NOTE — Progress Notes (Signed)
Patient is status post left above-knee amputation 5 days ago.  His wife is at his bedside.  Physical therapy has recommended skilled rehab.  He has been denied for inpatient rehab.  He is very resistant to going to a nursing facility.  His wife states that she cannot give him the help he needs because of his immobility right now.  She said last time she he came home he fell several times   Vital signs stable alert pleasant to exam wound VAC is working with 2 green checks.   Patient is ready to discharge to a skilled nursing facility.  I explained to the patient and his wife that recommendation by physical therapy and Occupational Therapy is for skilled rehab.  They understand that this would not be a long-term thing but only a couple weeks till he gets his mobility back.  We will consult transition of care can go to nursing facility when bed available

## 2021-01-26 NOTE — Plan of Care (Signed)

## 2021-01-26 NOTE — Progress Notes (Signed)
Inpatient Diabetes Program Recommendations  AACE/ADA: New Consensus Statement on Inpatient Glycemic Control (2015)  Target Ranges:  Prepandial:   less than 140 mg/dL      Peak postprandial:   less than 180 mg/dL (1-2 hours)      Critically ill patients:  140 - 180 mg/dL   Lab Results  Component Value Date   GLUCAP 227 (H) 01/26/2021   HGBA1C 12.3 (H) 01/21/2021   Results for Troy [name], Troy [name] (MRN [id number]) as of 01/26/2021 09:42  Ref. Range 01/25/2021 07:43 01/25/2021 12:30 01/25/2021 16:31 01/25/2021 21:19 01/26/2021 07:45  Glucose-Capillary Latest Ref Range: 70 - 99 mg/dL 265 (H) 365 (H) 208 (H) 188 (H) 227 (H)   Review of Glycemic Control Home medications: Farxiga/metformin combo 08-5463 mg Daily, Trulicity 1.5 mg QSunday, Tresiba 24 units bid  Current: Semglee 16 units BID, Novolog 0-15 units TID with meals and 0-5 HS, Trulicity 1.5 mg Q Sunday  Inpatient Diabetes Program Recommendations:    -  Increase Semglee closer to home dose of basal to 20 units bid  -  Add Novolog 3 units TID with meals if eating > 50%   Tama Headings RN, MSN, BC-ADM Inpatient Diabetes Coordinator Team Pager [phone] (8a-5p)

## 2021-01-26 NOTE — NC FL2 (Signed)
[place] LEVEL OF CARE SCREENING TOOL     IDENTIFICATION  Patient Name: Troy [name] Birthdate: 1961/05/15 Sex: male Admission Date (Current Location): 01/21/2021  [facility] [place] Number:  Herbalist and Address:  [facility] [place] [employer], [place] [address] [place], [place] [address]      Provider Number: [id number]  Attending Physician Name and Address:  [name], MD  Relative Name and Phone Number:       Current Level of Care: Hospital Recommended Level of Care: [place] Prior Approval Number:    Date Approved/Denied:   PASRR Number: [id number]  Discharge Plan: SNF    Current Diagnoses: Patient Active Problem List   Diagnosis Date Noted   Abscess of bursa of left ankle 01/21/2021   Dehiscence of amputation stump ([place])    Ischemia of left BKA site [facility])    Abscess of leg without foot, left    Osteomyelitis of second toe of right foot ([place]) 07/12/2019   Below knee amputation status, left 01/25/2018   Wound dehiscence, surgical    Foot amputation status, left 01/03/2018   Status post left foot surgery 12/15/2017   GERD (gastroesophageal reflux disease) 09/26/2017   Hyperlipidemia associated with type 2 diabetes mellitus ([place]) 07/08/2017   Snoring 06/07/2016   Chest pain 01/22/2016   Abnormal nuclear stress test 01/22/2016   Pain, chronic due to trauma 07/04/2012   Complex regional pain syndrome I of lower limb 07/04/2012   COPD (chronic obstructive pulmonary disease) ([place]) 01/27/2011   Pre-operative cardiovascular examination 01/27/2011   Nonspecific abnormal electrocardiogram (ECG) (EKG) 01/27/2011   Murmur 01/27/2011   DM2 (diabetes mellitus, type 2) (HCC)    HTN (hypertension)    HLD (hyperlipidemia)    Tobacco abuse     Orientation RESPIRATION BLADDER Height & Weight     Self, Time, Situation, Place  Normal Continent Weight: 185 lb (83.9 kg) Height:  6' (182.9 cm)  BEHAVIORAL SYMPTOMS/MOOD  NEUROLOGICAL BOWEL NUTRITION STATUS      Continent Diet  AMBULATORY STATUS COMMUNICATION OF NEEDS Skin   Extensive Assist Verbally Surgical wounds, Wound Vac (Left knee/leg)                       Personal Care Assistance Level of Assistance  Bathing, Feeding, Dressing Bathing Assistance: Limited assistance Feeding assistance: Limited assistance Dressing Assistance: Limited assistance     Functional Limitations Info  Sight, Hearing, Speech Sight Info: Adequate Hearing Info: Adequate Speech Info: Adequate    SPECIAL CARE FACTORS FREQUENCY  PT (By licensed PT), OT (By licensed OT)     PT Frequency: 5x a week OT Frequency: 5x a week            Contractures Contractures Info: Not present    Additional Factors Info  Code Status, Allergies Code Status Info: Full Allergies Info: Hydromorphone Hcl Er   Tapentadol   Exalamide           Current Medications (01/26/2021):  This is the current hospital active medication list Current Facility-Administered Medications  Medication Dose Route Frequency Provider Last Rate Last Admin   0.9 %  sodium chloride infusion   Intravenous Continuous Persons, Bevely Palmer, [place] 10 mL/hr at 01/23/21 0401 Infusion Verify at 01/23/21 0401   acetaminophen (TYLENOL) tablet 325-650 mg  325-650 mg Oral Q6H PRN Persons, Bevely Palmer, PA       albuterol (PROVENTIL) (2.5 MG/3ML) 0.083% nebulizer solution 2.5 mg  2.5 mg Nebulization Q6H PRN [name], MD       alum & mag hydroxide-simeth (MAALOX/MYLANTA) 200-200-20 MG/5ML suspension 15-30 mL  15-30 mL Oral Q2H PRN Persons, Bevely Palmer, PA       amitriptyline (ELAVIL) tablet 150 mg  150 mg Oral QHS Persons, Bevely Palmer, [place]   150 mg at 01/25/21 2035   ascorbic acid (VITAMIN C) tablet 1,000 mg  1,000 mg Oral Daily Persons, Bevely Palmer, PA   1,000 mg at 01/26/21 4401   aspirin EC tablet 81 mg  81 mg Oral Daily Persons, Bevely Palmer, PA   81 mg at 01/26/21 0846   bisacodyl (DULCOLAX) EC tablet 5 mg  5 mg Oral Daily PRN  Persons, Bevely Palmer, PA   5 mg at 01/26/21 1430   docusate sodium (COLACE) capsule 100 mg  100 mg Oral Daily Persons, Bevely Palmer, PA   100 mg at 01/26/21 0272   Dulaglutide SOPN 1.5 mg  1.5 mg Subcutaneous Q Sun Persons, Bevely Palmer, [place]   1.5 mg at 01/25/21 1026   gabapentin (NEURONTIN) capsule 300 mg  300 mg Oral QID Persons, Bevely Palmer, PA   300 mg at 01/26/21 1430   guaiFENesin-dextromethorphan (ROBITUSSIN DM) 100-10 MG/5ML syrup 15 mL  15 mL Oral Q4H PRN Persons, Bevely Palmer, PA       insulin aspart (novoLOG) injection 0-15 Units  0-15 Units Subcutaneous TID WC Magnant, Charles L, PA-C   5 Units at 01/26/21 1213   insulin aspart (novoLOG) injection 0-5 Units  0-5 Units Subcutaneous QHS Magnant, Charles L, PA-C   3 Units at 01/24/21 2040   insulin aspart (novoLOG) injection 3 Units  3 Units Subcutaneous TID WC Persons, Bevely Palmer, PA       insulin glargine-yfgn (SEMGLEE) injection 20 Units  20 Units Subcutaneous BID Persons, Bevely Palmer, PA       ipratropium (ATROVENT) nebulizer solution 0.5 mg  0.5 mg Nebulization QID PRN Persons, Bevely Palmer, PA       lactated ringers infusion   Intravenous Continuous Brennan Bailey, MD 10 mL/hr at 01/21/21 1138 Restarted at 01/21/21 1217   nutrition supplement (JUVEN) (JUVEN) powder packet 1 packet  1 packet Oral BID BM Persons, Bevely Palmer, PA   1 packet at 01/26/21 1430   ondansetron (ZOFRAN) injection 4 mg  4 mg Intravenous Q6H PRN Persons, Bevely Palmer, PA       oxyCODONE (Oxy IR/ROXICODONE) immediate release tablet 10 mg  10 mg Oral Q4H PRN Meredith Pel, MD   10 mg at 01/25/21 1458   oxyCODONE (OXYCONTIN) 12 hr tablet 10 mg  10 mg Oral Q12H Meredith Pel, MD   10 mg at 01/26/21 1102   pantoprazole (PROTONIX) EC tablet 40 mg  40 mg Oral Daily Persons, Bevely Palmer, PA   40 mg at 01/26/21 0846   phenol (CHLORASEPTIC) mouth spray 1 spray  1 spray Mouth/Throat PRN Persons, Bevely Palmer, PA       polyethylene glycol (MIRALAX / GLYCOLAX) packet 17 g  17 g Oral Daily  PRN Persons, Bevely Palmer, PA   17 g at 01/26/21 1430   tiZANidine (ZANAFLEX) tablet 4 mg  4 mg Oral QHS [name], MD   4 mg at 01/25/21 2035   traZODone (DESYREL) tablet 100 mg  100 mg Oral QHS [name], MD   100 mg at 01/25/21 2035   zinc sulfate capsule 220 mg  220 mg Oral Daily Persons, Bevely Palmer,  PA   220 mg at 01/26/21 0846     Discharge Medications: Please see discharge summary for a list of discharge medications.  Relevant Imaging Results:  Relevant Lab Results:   Additional Information [id number], covid vaccinated no booster  Emeterio Reeve, LCSW

## 2021-01-27 LAB — GLUCOSE, CAPILLARY
Glucose-Capillary: 157 mg/dL — ABNORMAL HIGH (ref 70–99)
Glucose-Capillary: 163 mg/dL — ABNORMAL HIGH (ref 70–99)
Glucose-Capillary: 140 mg/dL — ABNORMAL HIGH (ref 70–99)
Glucose-Capillary: 172 mg/dL — ABNORMAL HIGH (ref 70–99)
Glucose-Capillary: 140 mg/dL — ABNORMAL HIGH (ref 70–99)

## 2021-01-27 NOTE — Discharge Summary (Signed)
Discharge Diagnoses:  Active Problems:   Dehiscence of amputation stump ([place])   Ischemia of left BKA site ([place])   Abscess of bursa of left ankle   Surgeries: Procedure(s): LEFT ABOVE KNEE AMPUTATION APPLICATION OF WOUND VAC on 01/21/2021    Consultants:   Discharged Condition: Improved  Hospital Course: Troy [name] is an 60 y.o. male who was admitted 01/21/2021 with a chief complaint of Osteomyelitis left below knee amputation, with a final diagnosis of Osteomyelitis Left Below Knee Amptuation.  Patient was brought to the operating room on 01/21/2021 and underwent Procedure(s): LEFT ABOVE KNEE AMPUTATION APPLICATION OF WOUND VAC.    Patient was given perioperative antibiotics:  Anti-infectives (From admission, onward)    Start     Dose/Rate Route Frequency Ordered Stop   01/21/21 1930  ceFAZolin (ANCEF) IVPB 2g/100 mL premix        2 g 200 mL/hr over 30 Minutes Intravenous Every 8 hours 01/21/21 1429 01/22/21 1252   01/21/21 0730  ceFAZolin (ANCEF) IVPB 2g/100 mL premix        2 g 200 mL/hr over 30 Minutes Intravenous On call to O.R. 01/21/21 4917 01/21/21 1208     .  Patient was given sequential compression devices, early ambulation, and aspirin for DVT prophylaxis.  Recent vital signs: Patient Vitals for the past 24 hrs:  BP Temp Temp src Pulse Resp SpO2  01/27/21 0336 (!) 157/90 98.3 F (36.8 C) Oral (!) 109 17 98 %  01/26/21 2059 140/88 98.2 F (36.8 C) Oral (!) 108 18 100 %  01/26/21 1249 (!) 152/100 98.4 F (36.9 C) -- (!) 104 17 100 %  01/26/21 0743 124/76 98.4 F (36.9 C) Oral 98 17 96 %  .  Recent laboratory studies: No results found.  Discharge Medications:   Allergies as of 01/27/2021       Reactions   Hydromorphone Hcl Er Anaphylaxis, Other (See Comments)   Allergic to DYE in extended-release tablet, can tolerate other forms of hydromorphone   Tapentadol Anaphylaxis, Swelling, Other (See Comments)   THROAT ANGIOEDEMA Nucynta [Tapentadol Hydrochloride]    Exalamide Other (See Comments)   UNSPECIFIED REACTION         Medication List     TAKE these medications    albuterol 108 (90 Base) MCG/ACT inhaler Commonly known as: VENTOLIN HFA Inhale 2 puffs into the lungs every 6 (six) hours as needed for wheezing or shortness of breath.   amitriptyline 150 MG tablet Commonly known as: ELAVIL Take 150 mg by mouth at bedtime.   ascorbic acid 1000 MG tablet Commonly known as: VITAMIN C Take 1 tablet (1,000 mg total) by mouth daily.   aspirin EC 81 MG tablet Take 1 tablet (81 mg total) by mouth daily.   Dapagliflozin-metFORMIN HCl ER 10-998 MG Tb24 Take 1 tablet by mouth daily.   Dulaglutide 1.5 MG/0.5ML Sopn Inject 1.5 mg into the skin every Sunday.   gabapentin 300 MG capsule Commonly known as: NEURONTIN Take 300 mg by mouth 4 (four) times daily.   naloxone 4 MG/0.1ML Liqd nasal spray kit Commonly known as: NARCAN Place 1 spray into the nose once.   nutrition supplement (JUVEN) Pack Take 1 packet by mouth 2 (two) times daily between meals.   omeprazole 40 MG capsule Commonly known as: PRILOSEC Take 40 mg by mouth daily.   OneTouch Verio test strip Generic drug: glucose blood USE TO CHECK BLOOD SUGAR 1-2 TIME(S) DAILY.   oxyCODONE-acetaminophen 10-325 MG tablet Commonly known as: PERCOCET  Take 1 tablet by mouth every 4 (four) hours as needed for pain. What changed:  when to take this reasons to take this   Pen Needles 32G X 4 MM Misc by Does not apply route.   tiZANidine 4 MG tablet Commonly known as: ZANAFLEX Take 4-12 mg by mouth at bedtime.   traZODone 50 MG tablet Commonly known as: DESYREL Take 50-100 mg by mouth at bedtime.   Tresiba 100 UNIT/ML Soln Generic drug: Insulin Degludec Inject 24 Units into the skin in the morning and at bedtime.   zinc sulfate 220 (50 Zn) MG capsule Take 1 capsule (220 mg total) by mouth daily.       ASK your doctor about these medications    ipratropium 0.02 %  nebulizer solution Commonly known as: ATROVENT Take 2.5 mLs (0.5 mg total) by nebulization 4 (four) times daily.        Diagnostic Studies: No results found.  Patient benefited maximally from their hospital stay and there were no complications.     Disposition: Discharge disposition: 03-Skilled Nursing Facility      Discharge Instructions     Call MD / Call 911   Complete by: As directed    If you experience chest pain or shortness of breath, CALL 911 and be transported to the hospital emergency room.  If you develope a fever above 101 F, pus (white drainage) or increased drainage or redness at the wound, or calf pain, call your surgeon's office.   Call MD / Call 911   Complete by: As directed    If you experience chest pain or shortness of breath, CALL 911 and be transported to the hospital emergency room.  If you develope a fever above 101 F, pus (white drainage) or increased drainage or redness at the wound, or calf pain, call your surgeon's office.   Constipation Prevention   Complete by: As directed    Drink plenty of fluids.  Prune juice may be helpful.  You may use a stool softener, such as Colace (over the counter) 100 mg twice a day.  Use MiraLax (over the counter) for constipation as needed.   Constipation Prevention   Complete by: As directed    Drink plenty of fluids.  Prune juice may be helpful.  You may use a stool softener, such as Colace (over the counter) 100 mg twice a day.  Use MiraLax (over the counter) for constipation as needed.   Diet - low sodium heart healthy   Complete by: As directed    Diet - low sodium heart healthy   Complete by: As directed    Increase activity slowly as tolerated   Complete by: As directed    Increase activity slowly as tolerated   Complete by: As directed    Negative Pressure Wound Therapy - Incisional   Complete by: As directed    Show patient how to attach preveena vac   Post-operative opioid taper instructions:    Complete by: As directed    POST-OPERATIVE OPIOID TAPER INSTRUCTIONS: It is important to wean off of your opioid medication as soon as possible. If you do not need pain medication after your surgery it is ok to stop day one. Opioids include: Codeine, Hydrocodone(Norco, Vicodin), Oxycodone(Percocet, oxycontin) and hydromorphone amongst others.  Long term and even short term use of opiods can cause: Increased pain response Dependence Constipation Depression Respiratory depression And more.  Withdrawal symptoms can include Flu like symptoms Nausea, vomiting And more Techniques to manage  these symptoms Hydrate well Eat regular healthy meals Stay active Use relaxation techniques(deep breathing, meditating, yoga) Do Not substitute Alcohol to help with tapering If you have been on opioids for less than two weeks and do not have pain than it is ok to stop all together.  Plan to wean off of opioids This plan should start within one week post op of your joint replacement. Maintain the same interval or time between taking each dose and first decrease the dose.  Cut the total daily intake of opioids by one tablet each day Next start to increase the time between doses. The last dose that should be eliminated is the evening dose.      Post-operative opioid taper instructions:   Complete by: As directed    POST-OPERATIVE OPIOID TAPER INSTRUCTIONS: It is important to wean off of your opioid medication as soon as possible. If you do not need pain medication after your surgery it is ok to stop day one. Opioids include: Codeine, Hydrocodone(Norco, Vicodin), Oxycodone(Percocet, oxycontin) and hydromorphone amongst others.  Long term and even short term use of opiods can cause: Increased pain response Dependence Constipation Depression Respiratory depression And more.  Withdrawal symptoms can include Flu like symptoms Nausea, vomiting And more Techniques to manage these symptoms Hydrate  well Eat regular healthy meals Stay active Use relaxation techniques(deep breathing, meditating, yoga) Do Not substitute Alcohol to help with tapering If you have been on opioids for less than two weeks and do not have pain than it is ok to stop all together.  Plan to wean off of opioids This plan should start within one week post op of your joint replacement. Maintain the same interval or time between taking each dose and first decrease the dose.  Cut the total daily intake of opioids by one tablet each day Next start to increase the time between doses. The last dose that should be eliminated is the evening dose.          Follow-up Information     Edem Tiegs, Bevely Palmer, [place] Follow up in 1 week(s).   Specialty: Orthopedic Surgery Contact information: [address] [place] [address] [phone]                  Signed: Bevely Palmer Alishea Beaudin 01/27/2021, 7:05 AM

## 2021-01-27 NOTE — Progress Notes (Signed)
   01/27/21 1713   PT - Assessment/Plan  PT Plan Discharge plan needs to be updated  PT Frequency (ACUTE ONLY) Min 3X/week   Reduced frequency to match d/c recs. Will inform supervising PT of decrease in frequency based on new SNF recommendation.     Jarvis Morgan Acute Rehabilitation Services Pager [phone] Office [phone]

## 2021-01-27 NOTE — Care Management Important Message (Signed)
Important Message  Patient Details  Name: TIMOTH SCHARA MRN: [id number] Date of Birth: 1961-02-28   Medicare Important Message Given:  Yes     Nashiya Disbrow 01/27/2021, 11:20 AM

## 2021-01-27 NOTE — Plan of Care (Signed)

## 2021-01-27 NOTE — TOC Progression Note (Signed)
Transition of Care [facility]) - Progression Note    Patient Details  Name: Troy [name] MRN: [id number] Date of Birth: Jul 20, 1960  [facility] CM/SW Contact  Emeterio Reeve, [place] Phone Number: 01/27/2021, 3:28 PM  Clinical Narrative:    CSW gave pt bed offers. Pt accepted [facility]. CSW asked if pt would like CSW to call wife. Pt stated no.   CSW confirmed with [facility] that [facility] can accept pt at [facility]. [facility] will start insurance auth.   CSW will continue to follow.        Expected Discharge Plan and Services           Expected Discharge Date: 01/27/21                                     Social Determinants of Health (SDOH) Interventions    Readmission Risk Interventions No flowsheet data found.  Emeterio Reeve, Latanya Presser, [place] Social Worker [phone]

## 2021-01-27 NOTE — Progress Notes (Signed)
Patient is comfortable sleeping wife is at bedside.  Patient is now agreed to go to skilled nursing wife is very relieved  Vital signs stable afebrile wound VAC canister has 0 cc 1 green check.   Plan for discharge to skilled nursing patient is ready to be discharged has a negative COVID test we will place orders

## 2021-01-28 LAB — GLUCOSE, CAPILLARY
Glucose-Capillary: 166 mg/dL — ABNORMAL HIGH (ref 70–99)
Glucose-Capillary: 187 mg/dL — ABNORMAL HIGH (ref 70–99)
Glucose-Capillary: 144 mg/dL — ABNORMAL HIGH (ref 70–99)
Glucose-Capillary: 175 mg/dL — ABNORMAL HIGH (ref 70–99)

## 2021-01-28 NOTE — Progress Notes (Signed)
Occupational Therapy Treatment Patient Details Name: Troy [name] MRN: [id number] DOB: 08-12-1960 Today's Date: 01/28/2021    History of present illness Troy [name] is a 60 y/o male admitted on 01/21/21 with the diagnosis of L LE Osteomyelitis, Non healing ulcer left leg. Pt now s/p Above Below Knee Amptuation. 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 Pt able to transfer from lower heights this session with min A to power up. He overall is becoming more stable and mobile. Continues to benefit from SNF therapies to improve activity tolerance and strength for functional mobility. OT will continue to follow acutely.    Follow Up Recommendations  SNF    Equipment Recommendations  None recommended by OT    Recommendations for Other Services      Precautions / Restrictions Precautions Precautions: Fall Restrictions Weight Bearing Restrictions: Yes LLE Weight Bearing: Non weight bearing       Mobility Bed Mobility Overal bed mobility: Modified Independent             General bed mobility comments: sup<>sit, no assist needed    Transfers Overall transfer level: Needs assistance Equipment used: Rolling walker (2 wheeled) Transfers: Sit to/from Stand Sit to Stand: Min assist;From elevated surface         General transfer comment: Min A to stand from elevated bed surface and recliner x5    Balance Overall balance assessment: Mild deficits observed, not formally tested                                         ADL either performed or assessed with clinical judgement   ADL Overall ADL's : Needs assistance/impaired                         Toilet Transfer: Min Designer, jewellery Details (indicate cue type and reason): Able to transfer short distances.         Functional mobility during ADLs: Min guard;Rolling walker General ADL  Comments: Pt ambulating short distances in room to practice transfers from different height surfaces.     Vision       Perception     Praxis      Cognition Arousal/Alertness: Awake/alert Behavior During Therapy: WFL for tasks assessed/performed Overall Cognitive Status: History of cognitive impairments - at baseline                                 General Comments: Per wife, pt is unable to read or write at baseline, and pt reports that he is typically quiet at baseline.        Exercises     Shoulder Instructions       General Comments VSS, wound vac removed, shrinker on.    Pertinent Vitals/ Pain       Pain Assessment: 0-10 Pain Score: 8  Pain Location: L residual limp Pain Descriptors / Indicators: Aching;Grimacing;Guarding;Sore;Moaning Pain Intervention(s): Monitored during session;Premedicated before session  Home Living  Prior Functioning/Environment              Frequency  Min 2X/week        Progress Toward Goals  OT Goals(current goals can now be found in the care plan section)  Progress towards OT goals: Progressing toward goals  Acute Rehab OT Goals Patient Stated Goal: less pain, go home OT Goal Formulation: With patient/family Time For Goal Achievement: 02/05/21 Potential to Achieve Goals: Good ADL Goals Pt Will Perform Grooming: with supervision;with set-up;with caregiver independent in assisting;sitting Pt Will Perform Upper Body Bathing: with min guard assist;with supervision;with caregiver independent in assisting;sitting Pt Will Perform Lower Body Bathing: with mod assist;with min assist;sitting/lateral leans;with caregiver independent in assisting Pt Will Perform Upper Body Dressing: with min guard assist;with supervision;sitting Pt Will Transfer to Toilet: with min guard assist;stand pivot transfer Additional ADL Goal #1: Pt will complete bed mobility  independently to sit EOB for functional tasks  Plan Discharge plan remains appropriate;Frequency remains appropriate    Co-evaluation                 AM-PAC OT "6 Clicks" Daily Activity     Outcome Measure   Help from another person eating meals?: None Help from another person taking care of personal grooming?: A Little Help from another person toileting, which includes using toliet, bedpan, or urinal?: A Little Help from another person bathing (including washing, rinsing, drying)?: A Lot Help from another person to put on and taking off regular upper body clothing?: A Little Help from another person to put on and taking off regular lower body clothing?: A Lot 6 Click Score: 17    End of Session Equipment Utilized During Treatment: Rolling walker  OT Visit Diagnosis: Unsteadiness on feet (R26.81);Other abnormalities of gait and mobility (R26.89);Muscle weakness (generalized) (M62.81);Pain Pain - Right/Left: Left Pain - part of body: Leg   Activity Tolerance Patient tolerated treatment well   Patient Left in bed;with call bell/phone within reach   Nurse Communication Mobility status        Time: 1346-1410 OT Time Calculation (min): 24 min  Charges: OT General Charges $OT Visit: 1 Visit OT Treatments $Therapeutic Activity: 23-37 mins  Ravina Milner H., OTR/L Acute Rehabilitation  Micky Overturf Elane Yolanda Bonine 01/28/2021, 3:57 PM

## 2021-01-28 NOTE — Progress Notes (Signed)
Orthopedic Tech Progress Note Patient Details:  Troy [name] 1961-02-21 [id number] Called in order to [facility] for an AKA SHRINKER   Patient ID: Troy [name], male   DOB: 1960-09-16, 60 y.o.   MRN: [id number]  Troy [name] 01/28/2021, 8:25 AM

## 2021-01-28 NOTE — Progress Notes (Signed)
Patient is 1 week status post above-knee amputation.  He is lying in bed alert and awake his wife is at his bedside   Vital signs stable wound VAC is functioning 0 cc in the canister.  It was removed today.  He has well apposed wound edges no cellulitis no signs of infection minimal bleeding swelling is well controlled  Plan for discharge to skilled nursing today.  We will follow-up in our office in 1 week

## 2021-01-28 NOTE — TOC Progression Note (Signed)
Transition of Care [facility]) - Progression Note    Patient Details  Name: Troy [name] MRN: [id number] Date of Birth: 04-10-1961  [facility]) CM/SW Contact  Emeterio Reeve, [place] Phone Number: 01/28/2021, 2:33 PM  Clinical Narrative:     11:15am CSW spoke to pts wife on phone. Wife wanted to tour both facilities before making a decision.   2:30" CSW spoke to pt and wife at bedside. The pair decided that after tour they will feel more comfortable with [facility] over [facility]. CSW reached out to admission coordinator to start insurance auth. They will be able to accept pt when Josem Kaufmann is received.   CSW will follow.       Expected Discharge Plan and Services           Expected Discharge Date: 01/28/21                                     Social Determinants of Health (SDOH) Interventions    Readmission Risk Interventions No flowsheet data found.  Emeterio Reeve, Latanya Presser, [place] Social Worker [phone]

## 2021-01-28 NOTE — Discharge Summary (Signed)
Discharge Diagnoses:  Active Problems:   Dehiscence of amputation stump ([place])   Ischemia of left BKA site ([place])   Abscess of bursa of left ankle   Surgeries: Procedure(s): LEFT ABOVE KNEE AMPUTATION APPLICATION OF WOUND VAC on 01/21/2021    Consultants:   Discharged Condition: Improved  Hospital Course: Troy [name] is an 60 y.o. male who was admitted 01/21/2021 with a chief complaint of left  leg ulcer, with a final diagnosis of Osteomyelitis Left Below Knee Amptuation.  Patient was brought to the operating room on 01/21/2021 and underwent Procedure(s): LEFT ABOVE KNEE AMPUTATION APPLICATION OF WOUND VAC.    Patient was given perioperative antibiotics:  Anti-infectives (From admission, onward)    Start     Dose/Rate Route Frequency Ordered Stop   01/21/21 1930  ceFAZolin (ANCEF) IVPB 2g/100 mL premix        2 g 200 mL/hr over 30 Minutes Intravenous Every 8 hours 01/21/21 1429 01/22/21 1252   01/21/21 0730  ceFAZolin (ANCEF) IVPB 2g/100 mL premix        2 g 200 mL/hr over 30 Minutes Intravenous On call to O.R. 01/21/21 0211 01/21/21 1208     .  Patient was given sequential compression devices, early ambulation, and aspirin for DVT prophylaxis.  Recent vital signs: Patient Vitals for the past 24 hrs:  BP Temp Temp src Pulse Resp SpO2  01/28/21 0558 129/84 98.1 F (36.7 C) -- 97 18 98 %  01/27/21 1335 134/76 98.3 F (36.8 C) Oral (!) 101 16 97 %  01/27/21 0817 (!) 149/78 98.2 F (36.8 C) Oral (!) 101 -- 96 %  .  Recent laboratory studies: No results found.  Discharge Medications:   Allergies as of 01/28/2021       Reactions   Hydromorphone Hcl Er Anaphylaxis, Other (See Comments)   Allergic to DYE in extended-release tablet, can tolerate other forms of hydromorphone   Tapentadol Anaphylaxis, Swelling, Other (See Comments)   THROAT ANGIOEDEMA Nucynta [Tapentadol Hydrochloride]   Exalamide Other (See Comments)   UNSPECIFIED REACTION         Medication List      TAKE these medications    albuterol 108 (90 Base) MCG/ACT inhaler Commonly known as: VENTOLIN HFA Inhale 2 puffs into the lungs every 6 (six) hours as needed for wheezing or shortness of breath.   amitriptyline 150 MG tablet Commonly known as: ELAVIL Take 150 mg by mouth at bedtime.   ascorbic acid 1000 MG tablet Commonly known as: VITAMIN C Take 1 tablet (1,000 mg total) by mouth daily.   aspirin EC 81 MG tablet Take 1 tablet (81 mg total) by mouth daily.   Dapagliflozin-metFORMIN HCl ER 10-998 MG Tb24 Take 1 tablet by mouth daily.   Dulaglutide 1.5 MG/0.5ML Sopn Inject 1.5 mg into the skin every Sunday.   gabapentin 300 MG capsule Commonly known as: NEURONTIN Take 300 mg by mouth 4 (four) times daily.   naloxone 4 MG/0.1ML Liqd nasal spray kit Commonly known as: NARCAN Place 1 spray into the nose once.   nutrition supplement (JUVEN) Pack Take 1 packet by mouth 2 (two) times daily between meals.   omeprazole 40 MG capsule Commonly known as: PRILOSEC Take 40 mg by mouth daily.   OneTouch Verio test strip Generic drug: glucose blood USE TO CHECK BLOOD SUGAR 1-2 TIME(S) DAILY.   oxyCODONE-acetaminophen 10-325 MG tablet Commonly known as: PERCOCET Take 1 tablet by mouth every 4 (four) hours as needed for pain. What changed:  when to take this reasons to take this   Pen Needles 32G X 4 MM Misc by Does not apply route.   tiZANidine 4 MG tablet Commonly known as: ZANAFLEX Take 4-12 mg by mouth at bedtime.   traZODone 50 MG tablet Commonly known as: DESYREL Take 50-100 mg by mouth at bedtime.   Tresiba 100 UNIT/ML Soln Generic drug: Insulin Degludec Inject 24 Units into the skin in the morning and at bedtime.   zinc sulfate 220 (50 Zn) MG capsule Take 1 capsule (220 mg total) by mouth daily.       ASK your doctor about these medications    ipratropium 0.02 % nebulizer solution Commonly known as: ATROVENT Take 2.5 mLs (0.5 mg total) by  nebulization 4 (four) times daily.        Diagnostic Studies: No results found.  Patient benefited maximally from their hospital stay and there were no complications.     Disposition: Discharge disposition: 03-Skilled Nursing Facility      Discharge Instructions     Apply dressing   Complete by: As directed    Cleanse amputation stump daily with antibacterial soap and water. Apply clean dry dressing and shrinker   Call MD / Call 911   Complete by: As directed    If you experience chest pain or shortness of breath, CALL 911 and be transported to the hospital emergency room.  If you develope a fever above 101 F, pus (white drainage) or increased drainage or redness at the wound, or calf pain, call your surgeon's office.   Call MD / Call 911   Complete by: As directed    If you experience chest pain or shortness of breath, CALL 911 and be transported to the hospital emergency room.  If you develope a fever above 101 F, pus (white drainage) or increased drainage or redness at the wound, or calf pain, call your surgeon's office.   Call MD / Call 911   Complete by: As directed    If you experience chest pain or shortness of breath, CALL 911 and be transported to the hospital emergency room.  If you develope a fever above 101 F, pus (white drainage) or increased drainage or redness at the wound, or calf pain, call your surgeon's office.   Constipation Prevention   Complete by: As directed    Drink plenty of fluids.  Prune juice may be helpful.  You may use a stool softener, such as Colace (over the counter) 100 mg twice a day.  Use MiraLax (over the counter) for constipation as needed.   Constipation Prevention   Complete by: As directed    Drink plenty of fluids.  Prune juice may be helpful.  You may use a stool softener, such as Colace (over the counter) 100 mg twice a day.  Use MiraLax (over the counter) for constipation as needed.   Constipation Prevention   Complete by: As  directed    Drink plenty of fluids.  Prune juice may be helpful.  You may use a stool softener, such as Colace (over the counter) 100 mg twice a day.  Use MiraLax (over the counter) for constipation as needed.   Diet - low sodium heart healthy   Complete by: As directed    Diet - low sodium heart healthy   Complete by: As directed    Diet - low sodium heart healthy   Complete by: As directed    Increase activity slowly as tolerated   Complete  by: As directed    Increase activity slowly as tolerated   Complete by: As directed    Increase activity slowly as tolerated   Complete by: As directed    Post-operative opioid taper instructions:   Complete by: As directed    POST-OPERATIVE OPIOID TAPER INSTRUCTIONS: It is important to wean off of your opioid medication as soon as possible. If you do not need pain medication after your surgery it is ok to stop day one. Opioids include: Codeine, Hydrocodone(Norco, Vicodin), Oxycodone(Percocet, oxycontin) and hydromorphone amongst others.  Long term and even short term use of opiods can cause: Increased pain response Dependence Constipation Depression Respiratory depression And more.  Withdrawal symptoms can include Flu like symptoms Nausea, vomiting And more Techniques to manage these symptoms Hydrate well Eat regular healthy meals Stay active Use relaxation techniques(deep breathing, meditating, yoga) Do Not substitute Alcohol to help with tapering If you have been on opioids for less than two weeks and do not have pain than it is ok to stop all together.  Plan to wean off of opioids This plan should start within one week post op of your joint replacement. Maintain the same interval or time between taking each dose and first decrease the dose.  Cut the total daily intake of opioids by one tablet each day Next start to increase the time between doses. The last dose that should be eliminated is the evening dose.      Post-operative  opioid taper instructions:   Complete by: As directed    POST-OPERATIVE OPIOID TAPER INSTRUCTIONS: It is important to wean off of your opioid medication as soon as possible. If you do not need pain medication after your surgery it is ok to stop day one. Opioids include: Codeine, Hydrocodone(Norco, Vicodin), Oxycodone(Percocet, oxycontin) and hydromorphone amongst others.  Long term and even short term use of opiods can cause: Increased pain response Dependence Constipation Depression Respiratory depression And more.  Withdrawal symptoms can include Flu like symptoms Nausea, vomiting And more Techniques to manage these symptoms Hydrate well Eat regular healthy meals Stay active Use relaxation techniques(deep breathing, meditating, yoga) Do Not substitute Alcohol to help with tapering If you have been on opioids for less than two weeks and do not have pain than it is ok to stop all together.  Plan to wean off of opioids This plan should start within one week post op of your joint replacement. Maintain the same interval or time between taking each dose and first decrease the dose.  Cut the total daily intake of opioids by one tablet each day Next start to increase the time between doses. The last dose that should be eliminated is the evening dose.      Post-operative opioid taper instructions:   Complete by: As directed    POST-OPERATIVE OPIOID TAPER INSTRUCTIONS: It is important to wean off of your opioid medication as soon as possible. If you do not need pain medication after your surgery it is ok to stop day one. Opioids include: Codeine, Hydrocodone(Norco, Vicodin), Oxycodone(Percocet, oxycontin) and hydromorphone amongst others.  Long term and even short term use of opiods can cause: Increased pain response Dependence Constipation Depression Respiratory depression And more.  Withdrawal symptoms can include Flu like symptoms Nausea, vomiting And more Techniques to  manage these symptoms Hydrate well Eat regular healthy meals Stay active Use relaxation techniques(deep breathing, meditating, yoga) Do Not substitute Alcohol to help with tapering If you have been on opioids for less than two weeks and  do not have pain than it is ok to stop all together.  Plan to wean off of opioids This plan should start within one week post op of your joint replacement. Maintain the same interval or time between taking each dose and first decrease the dose.  Cut the total daily intake of opioids by one tablet each day Next start to increase the time between doses. The last dose that should be eliminated is the evening dose.          Follow-up Information     Rim Thatch, Bevely Palmer, [place] Follow up in 1 week(s).   Specialty: Orthopedic Surgery Contact information: [address] [place] [address] [phone]                  Signed: Bevely Palmer Carol Theys 01/28/2021, 7:37 AM

## 2021-01-28 NOTE — Plan of Care (Signed)

## 2021-01-28 NOTE — Social Work (Signed)
CSW spoke to pts wife on phone. Wife was upset that she was not contacted about DC plan. CSW explained that she asked pt if he would like her to contact wife and pt stated no. Pt is alert and oriented x4. Wife states pt cannot read or write and should not be making important decisions concerning his healthcare. Wife states she will tour both facilities and make a decision. CSW informed wife that pts DC order has been in since yesterday and a decision needs to be made soon so insurance auth can be started.   Emeterio Reeve, Latanya Presser, [place] Social Worker [phone]

## 2021-01-29 LAB — GLUCOSE, CAPILLARY
Glucose-Capillary: 193 mg/dL — ABNORMAL HIGH (ref 70–99)
Glucose-Capillary: 182 mg/dL — ABNORMAL HIGH (ref 70–99)
Glucose-Capillary: 210 mg/dL — ABNORMAL HIGH (ref 70–99)
Glucose-Capillary: 144 mg/dL — ABNORMAL HIGH (ref 70–99)

## 2021-01-29 NOTE — Progress Notes (Signed)
Patient is status post above-knee amputation in good spirits today wife is at his bedside.  Vital signs stable alert dressing clean dry intact.  His wife forward to nursing facility yesterday and is chosen [facility]  Discharge orders entered.  Plan for discharge today printed pain medication is on his chart

## 2021-01-29 NOTE — Progress Notes (Signed)
Physical Therapy Treatment Patient Details Name: Troy [name] MRN: [id number] DOB: 21-Aug-1960 Today's Date: 01/29/2021    History of Present Illness Troy [name] is a 60 y/o male admitted on 01/21/21 with the diagnosis of L LE Osteomyelitis, Non healing ulcer left leg. Pt now s/p Above Below Knee Amptuation. 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  Pt was more awake and motivated this session.  He was able to perform 3 short gt trials.  Pt required seated rest break between trials.  Pt able to perform pericare without assistance.  He remains to benefit from snf placement to improve strength, balance and function before returning home.      Follow Up Recommendations  SNF     Equipment Recommendations  None recommended by PT    Recommendations for Other Services       Precautions / Restrictions Precautions Precautions: Fall Precaution Comments: wound VAC Restrictions Weight Bearing Restrictions: Yes LLE Weight Bearing: Non weight bearing    Mobility  Bed Mobility Overal bed mobility: Modified Independent Bed Mobility: Supine to Sit           General bed mobility comments: sup<>sit, no assist needed    Transfers Overall transfer level: Needs assistance Equipment used: Rolling walker (2 wheeled) Transfers: Sit to/from Stand Sit to Stand: Mod assist         General transfer comment: Cues for hand placement to and from seated surface.  Ambulation/Gait Ambulation/Gait assistance: Mod assist Gait Distance (Feet): 10 Feet (+ 12 ft + 10 ft.  Required seated rest break between trials.) Assistive device: Rolling walker (2 wheeled) Gait Pattern/deviations: Step-to pattern;Decreased step length - right;Trunk flexed Gait velocity: decr   General Gait Details: Pt able to perform increased gt this session.  He was fatigued but able to recover with seated rest breaks.   Stairs              Wheelchair Mobility    Modified Rankin (Stroke Patients Only)       Balance Overall balance assessment: Mild deficits observed, not formally tested Sitting-balance support: Bilateral upper extremity supported;Single extremity supported Sitting balance-Leahy Scale: Fair Sitting balance - Comments: Requiring at least single UE support with tendency to lean R   Standing balance support: Bilateral upper extremity supported Standing balance-Leahy Scale: Poor Standing balance comment: reliant on BUE support                            Cognition Arousal/Alertness: Awake/alert Behavior During Therapy: WFL for tasks assessed/performed Overall Cognitive Status: History of cognitive impairments - at baseline Area of Impairment: Safety/judgement;Problem solving                   Current Attention Level: Selective Memory: Decreased short-term memory   Safety/Judgement: Decreased awareness of deficits;Decreased awareness of safety   Problem Solving: Requires verbal cues;Difficulty sequencing;Slow processing General Comments: Per wife, pt is unable to read or write at baseline, and pt reports that he is typically quiet at baseline.      Exercises      General Comments        Pertinent Vitals/Pain Pain Assessment: 0-10 Pain Score: 8  Pain Location: L residual limb Pain Descriptors / Indicators: Aching;Grimacing;Guarding;Sore;Moaning Pain Intervention(s): Monitored during session;Repositioned;Patient requesting pain meds-RN notified;RN gave pain meds during session    Home  Living                      Prior Function            PT Goals (current goals can now be found in the care plan section) Acute Rehab PT Goals Patient Stated Goal: to get more clean underwear Potential to Achieve Goals: Good Progress towards PT goals: Progressing toward goals    Frequency    Min 3X/week      PT Plan Current plan remains appropriate     Co-evaluation              AM-PAC PT "6 Clicks" Mobility   Outcome Measure  Help needed turning from your back to your side while in a flat bed without using bedrails?: A Lot Help needed moving from lying on your back to sitting on the side of a flat bed without using bedrails?: A Lot Help needed moving to and from a bed to a chair (including a wheelchair)?: A Lot Help needed standing up from a chair using your arms (e.g., wheelchair or bedside chair)?: A Lot Help needed to walk in hospital room?: A Lot Help needed climbing 3-5 steps with a railing? : A Lot 6 Click Score: 12    End of Session Equipment Utilized During Treatment: Gait belt Activity Tolerance: Patient tolerated treatment well Patient left: with call bell/phone within reach;in bed;with bed alarm set Nurse Communication: Mobility status (pt with soiled bed linens and gown on arrival.  He reports from spilling urinal.) Pain - Right/Left: Left Pain - part of body: Hip     Time: 7517-0017 PT Time Calculation (min) (ACUTE ONLY): 25 min  Charges:  $Gait Training: 8-22 mins $Therapeutic Activity: 8-22 mins                     Erasmo Leventhal , PTA Acute Rehabilitation Services Pager [phone] Office [phone]    Royann Wildasin Eli Hose 01/29/2021, 5:21 PM

## 2021-01-29 NOTE — TOC Progression Note (Signed)
Transition of Care [facility]) - Progression Note    Patient Details  Name: CHANDLER SWIDERSKI MRN: [id number] Date of Birth: 1960/07/27  Transition of Care [facility]) CM/SW Contact  Emeterio Reeve, [place] Phone Number: 01/29/2021, 4:04 PM  Clinical Narrative:      Pts insurance is is still pending.   CSW will continue to follow.       Expected Discharge Plan and Services           Expected Discharge Date: 01/28/21                                     Social Determinants of Health (SDOH) Interventions    Readmission Risk Interventions No flowsheet data found.  Emeterio Reeve, Latanya Presser, [place] Social Worker [phone]

## 2021-01-30 LAB — GLUCOSE, CAPILLARY
Glucose-Capillary: 162 mg/dL — ABNORMAL HIGH (ref 70–99)
Glucose-Capillary: 132 mg/dL — ABNORMAL HIGH (ref 70–99)
Glucose-Capillary: 163 mg/dL — ABNORMAL HIGH (ref 70–99)
Glucose-Capillary: 271 mg/dL — ABNORMAL HIGH (ref 70–99)
Glucose-Capillary: 152 mg/dL — ABNORMAL HIGH (ref 70–99)
Glucose-Capillary: 226 mg/dL — ABNORMAL HIGH (ref 70–99)

## 2021-01-30 NOTE — Progress Notes (Signed)
Pt awaiting insurance auth for [facility] for rehab

## 2021-01-30 NOTE — Progress Notes (Signed)
Status post left above-knee amputation eating breakfast wife at bedside.  Vital signs stable plan for discharge to SNF hopefully insurance authorization today and transfer today

## 2021-01-31 LAB — GLUCOSE, CAPILLARY
Glucose-Capillary: 138 mg/dL — ABNORMAL HIGH (ref 70–99)
Glucose-Capillary: 159 mg/dL — ABNORMAL HIGH (ref 70–99)
Glucose-Capillary: 273 mg/dL — ABNORMAL HIGH (ref 70–99)
Glucose-Capillary: 103 mg/dL — ABNORMAL HIGH (ref 70–99)

## 2021-01-31 MED ORDER — FLEET ENEMA 7-19 GM/118ML RE ENEM: 1.0000 | ENEMA | Freq: Every day | RECTAL | Status: DC | PRN

## 2021-01-31 MED ORDER — BISACODYL 10 MG RE SUPP
10.0000 mg | Freq: Every day | RECTAL | Status: DC | PRN
  Administered 2021-01-31: 10 mg via RECTAL
  Filled 2021-01-31: qty 1

## 2021-01-31 NOTE — Progress Notes (Signed)
Patient ID: Troy [name], male   DOB: September 29, 1960, 60 y.o.   MRN: [id number] Patient is status post above-the-knee amputation.  Awaiting insurance authorization for skilled nursing placement.  Patient has not had a bowel movement.  Will write order for magnesium citrate and suppository.

## 2021-02-01 LAB — GLUCOSE, CAPILLARY
Glucose-Capillary: 133 mg/dL — ABNORMAL HIGH (ref 70–99)
Glucose-Capillary: 144 mg/dL — ABNORMAL HIGH (ref 70–99)
Glucose-Capillary: 225 mg/dL — ABNORMAL HIGH (ref 70–99)
Glucose-Capillary: 172 mg/dL — ABNORMAL HIGH (ref 70–99)

## 2021-02-01 NOTE — Plan of Care (Signed)

## 2021-02-01 NOTE — TOC Progression Note (Addendum)
Transition of Care [facility]) - Progression Note    Patient Details  Name: Troy [name] MRN: [id number] Date of Birth: 1961-03-17  Transition of Care [facility]) CM/SW Contact  Aurora Rody, [place], [place] Phone [phone] 02/01/2021, 1:52 PM  Clinical Narrative:     Phone call to [facility] coordinator for [facility] to discuss transfer. Patient will be able to admit tomorrow 02/02/21. Patient's spouse informed of plan to discharge tomorrow.   [address] LCSW Transition of Care [phone]        Expected Discharge Plan and Services           Expected Discharge Date: 01/30/21                                     Social Determinants of Health (SDOH) Interventions    Readmission Risk Interventions No flowsheet data found.

## 2021-02-01 NOTE — TOC Progression Note (Signed)
Transition of Care [facility]) - Progression Note    Patient Details  Name: Troy [name] MRN: [id number] Date of Birth: Nov 15, 1960  Transition of Care [facility]) CM/SW Contact  Elliot Gurney [place], [place] Phone Number: [phone] 02/01/2021, 10:12 AM  Clinical Narrative:    Covid test requested, patient accepted at [facility], will follow up to confirm transfer once covid results are obtained.   [address] LCSW Transition of Care [phone]         Expected Discharge Plan and Services           Expected Discharge Date: 01/30/21                                     Social Determinants of Health (SDOH) Interventions    Readmission Risk Interventions No flowsheet data found.

## 2021-02-01 NOTE — Progress Notes (Signed)
Patient is s/p left AKA.  Reports he is doing well and pain is overall controlled.  He has stump shrinker on and wound VAC was removed last week by [facility] Dage.  Denies any chest pain, shortness of breath, calf pain.  He was able to have bowel movement last night.  No other complaints.  Okay for discharge to skilled nursing facility from orthopedic standpoint

## 2021-02-01 NOTE — Progress Notes (Signed)
Pt COVID swab negative.  Please call wife when pt is getting picked up by [facility] if she is not here tomorrow (she has a morning appt).

## 2021-02-02 ENCOUNTER — Ambulatory Visit: Payer: Medicare HMO | Admitting: Orthopedic Surgery

## 2021-02-02 LAB — GLUCOSE, CAPILLARY
Glucose-Capillary: 182 mg/dL — ABNORMAL HIGH (ref 70–99)
Glucose-Capillary: 259 mg/dL — ABNORMAL HIGH (ref 70–99)

## 2021-02-02 LAB — SARS CORONAVIRUS 2 (TAT 6-24 HRS): SARS Coronavirus 2: NEGATIVE

## 2021-02-02 NOTE — TOC Transition Note (Signed)
Transition of Care [facility]) - CM/SW Discharge Note   Patient Details  Name: Troy [name] MRN: [id number] Date of Birth: Dec 25, 1960  Transition [facility]) CM/SW Contact:  Emeterio Reeve, LCSW Phone Number: 02/02/2021, 11:24 AM   Clinical Narrative:     Patient will DC to: [facility] Anticipated DC date: 02/02/21 Family notified: Wife Transport by: Corey Harold     Per MD patient ready for DC to [facility]. RN, patient, patient's family, and facility notified of DC. Discharge Summary and FL2 sent to facility. DC packet on chart. Insurance Josem Kaufmann has been received and pt is covid negative. Ambulance transport requested for patient.    RN to call report to [phone]  CSW will sign off for now as social work intervention is no longer needed. Please consult [name] again if new needs arise.   Final next level of care: Skilled Nursing Facility Barriers to Discharge: Barriers Resolved   Patient Goals and CMS Choice        Discharge Placement              Patient chooses bed at: Other - please specify in the comment section below: ([facility]) Patient to be transferred to facility by: [facility] Name of family member notified: Wife Patient and family notified of of transfer: 02/02/21  Discharge Plan and Services                                     Social Determinants of Health (SDOH) Interventions     Readmission Risk Interventions No flowsheet data found.  Emeterio Reeve, [place] Clinical Social Worker [phone]

## 2021-02-02 NOTE — Progress Notes (Signed)
Attempted to call [facility], with no response.

## 2021-02-02 NOTE — Progress Notes (Signed)
Patient doing well skilled nursing has been approved patient for discharge today

## 2021-02-02 NOTE — Progress Notes (Addendum)
Physical Therapy Treatment Patient Details Name: Troy [name] MRN: [id number] DOB: January 22, 1961 Today's Date: 02/02/2021    History of Present Illness DUSHAWN PUSEY is a 60 y/o male admitted on 01/21/21 with the diagnosis of L LE Osteomyelitis, Non healing ulcer left leg. Pt now s/p Above Below Knee Amptuation. 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  He required encouragement from his wife to participate in PT session today.  Pt making great progress and able to progress gt distance.  He did require seated rest break between trials.  Continue to recommend SNF placement as he continues to require min to mod assistance.   Of note: Pt and his wife are very concerned about painful swollen nodules on the L side of his neck.  Informed nurse and MD during session.     Follow Up Recommendations  SNF     Equipment Recommendations  None recommended by PT    Recommendations for Other Services       Precautions / Restrictions Precautions Precautions: Fall Precaution Comments: wound VAC Restrictions Weight Bearing Restrictions: Yes LLE Weight Bearing: Non weight bearing    Mobility  Bed Mobility   Bed Mobility: Rolling;Sidelying to Sit Rolling: Supervision Sidelying to sit: Min assist       General bed mobility comments: Heavy cues for hand placement and rolling.  Able to move to sitting with cues for trunk control and assistance to guide him into an upright position.    Transfers Overall transfer level: Needs assistance Equipment used: Rolling walker (2 wheeled) Transfers: Sit to/from Stand Sit to Stand: Min assist;Mod assist         General transfer comment: Min assistance from bed and mod assistance from recliner.  Ambulation/Gait Ambulation/Gait assistance: Min assist Gait Distance (Feet): 30 Feet (x2 trials.) Assistive device: Rolling walker (2 wheeled) Gait Pattern/deviations: Step-to  pattern;Decreased step length - right;Trunk flexed Gait velocity: decr   General Gait Details: Pt able to perform increased gt this session.  He was fatigued but able to recover with seated rest breaks.   Stairs             Wheelchair Mobility    Modified Rankin (Stroke Patients Only)       Balance Overall balance assessment: Mild deficits observed, not formally tested   Sitting balance-Leahy Scale: Fair       Standing balance-Leahy Scale: Poor Standing balance comment: reliant on BUE support                            Cognition Arousal/Alertness: Awake/alert Behavior During Therapy: WFL for tasks assessed/performed Overall Cognitive Status: History of cognitive impairments - at baseline                                 General Comments: Per wife, pt is unable to read or write at baseline, and pt reports that he is typically quiet at baseline.      Exercises      General Comments        Pertinent Vitals/Pain Pain Assessment: 0-10 Pain Score: 9  Pain Location: L side of neck with hard indurated bumps x 2 (appears to look like an abscess ) very red and swollen, less pain 5/10 in L residual limb Pain Descriptors / Indicators:  Aching;Grimacing;Guarding;Sore;Moaning Pain Intervention(s): Monitored during session;Repositioned;Heat applied (applied heat to L side of neck.)    Home Living                      Prior Function            PT Goals (current goals can now be found in the care plan section) Acute Rehab PT Goals Patient Stated Goal: to get MD to look at his neck Potential to Achieve Goals: Good Progress towards PT goals: Progressing toward goals    Frequency    Min 3X/week      PT Plan Current plan remains appropriate    Co-evaluation              AM-PAC PT "6 Clicks" Mobility   Outcome Measure  Help needed turning from your back to your side while in a flat bed without using bedrails?: A  Lot Help needed moving from lying on your back to sitting on the side of a flat bed without using bedrails?: A Lot Help needed moving to and from a bed to a chair (including a wheelchair)?: A Lot Help needed standing up from a chair using your arms (e.g., wheelchair or bedside chair)?: A Lot Help needed to walk in hospital room?: A Lot Help needed climbing 3-5 steps with a railing? : A Lot 6 Click Score: 12    End of Session Equipment Utilized During Treatment: Gait belt Activity Tolerance: Patient tolerated treatment well Patient left: with call bell/phone within reach;in chair;with chair alarm set Nurse Communication: Mobility status (L side of neck painful and swollen) PT Visit Diagnosis: Unsteadiness on feet (R26.81);Muscle weakness (generalized) (M62.81);Pain Pain - Right/Left: Left Pain - part of body: Hip     Time: 1202-1222 PT Time Calculation (min) (ACUTE ONLY): 20 min  Charges:  $Gait Training: 8-22 mins                     Erasmo Leventhal , PTA Acute Rehabilitation Services Pager [phone] Office [phone]    Elija Mccamish Eli Hose 02/02/2021, 1:18 PM

## 2021-02-02 NOTE — Progress Notes (Signed)
Patient discharged to [facility] as ordered, transported via [facility], reported to nurse Precious.

## 2021-02-02 NOTE — Discharge Summary (Signed)
Discharge Diagnoses:  Active Problems:   Dehiscence of amputation stump ([place])   Ischemia of left BKA site ([place])   Abscess of bursa of left ankle   Surgeries: Procedure(s): LEFT ABOVE KNEE AMPUTATION APPLICATION OF WOUND VAC on 01/21/2021    Consultants:   Discharged Condition: Improved  Hospital Course: Troy [name] is an 60 y.o. male who was admitted 01/21/2021 with a chief complaint of left leg ulcer, with a final diagnosis of Osteomyelitis Left Below Knee Amptuation.  Patient was brought to the operating room on 01/21/2021 and underwent Procedure(s): LEFT ABOVE KNEE AMPUTATION APPLICATION OF WOUND VAC.    Patient was given perioperative antibiotics:  Anti-infectives (From admission, onward)    Start     Dose/Rate Route Frequency Ordered Stop   01/21/21 1930  ceFAZolin (ANCEF) IVPB 2g/100 mL premix        2 g 200 mL/hr over 30 Minutes Intravenous Every 8 hours 01/21/21 1429 01/22/21 1252   01/21/21 0730  ceFAZolin (ANCEF) IVPB 2g/100 mL premix        2 g 200 mL/hr over 30 Minutes Intravenous On call to O.R. 01/21/21 6606 01/21/21 1208     .  Patient was given sequential compression devices, early ambulation, and aspirin for DVT prophylaxis.  Recent vital signs: Patient Vitals for the past 24 hrs:  BP Temp Temp src Pulse Resp SpO2  02/02/21 0600 125/84 98.5 F (36.9 C) Oral (!) 109 17 97 %  02/01/21 2138 137/77 98.4 F (36.9 C) Oral (!) 110 17 99 %  02/01/21 0756 126/77 97.7 F (36.5 C) Oral (!) 103 17 97 %  .  Recent laboratory studies: No results found.  Discharge Medications:   Allergies as of 02/02/2021       Reactions   Hydromorphone Hcl Er Anaphylaxis, Other (See Comments)   Allergic to DYE in extended-release tablet, can tolerate other forms of hydromorphone   Tapentadol Anaphylaxis, Swelling, Other (See Comments)   THROAT ANGIOEDEMA Nucynta [Tapentadol Hydrochloride]   Exalamide Other (See Comments)   UNSPECIFIED REACTION         Medication List      TAKE these medications    albuterol 108 (90 Base) MCG/ACT inhaler Commonly known as: VENTOLIN HFA Inhale 2 puffs into the lungs every 6 (six) hours as needed for wheezing or shortness of breath.   amitriptyline 150 MG tablet Commonly known as: ELAVIL Take 150 mg by mouth at bedtime.   ascorbic acid 1000 MG tablet Commonly known as: VITAMIN C Take 1 tablet (1,000 mg total) by mouth daily.   aspirin EC 81 MG tablet Take 1 tablet (81 mg total) by mouth daily.   Dapagliflozin-metFORMIN HCl ER 10-998 MG Tb24 Take 1 tablet by mouth daily.   Dulaglutide 1.5 MG/0.5ML Sopn Inject 1.5 mg into the skin every Sunday.   gabapentin 300 MG capsule Commonly known as: NEURONTIN Take 300 mg by mouth 4 (four) times daily.   naloxone 4 MG/0.1ML Liqd nasal spray kit Commonly known as: NARCAN Place 1 spray into the nose once.   nutrition supplement (JUVEN) Pack Take 1 packet by mouth 2 (two) times daily between meals.   omeprazole 40 MG capsule Commonly known as: PRILOSEC Take 40 mg by mouth daily.   OneTouch Verio test strip Generic drug: glucose blood USE TO CHECK BLOOD SUGAR 1-2 TIME(S) DAILY.   oxyCODONE-acetaminophen 10-325 MG tablet Commonly known as: PERCOCET Take 1 tablet by mouth every 4 (four) hours as needed for pain. What changed:  when  to take this reasons to take this   Pen Needles 32G X 4 MM Misc by Does not apply route.   tiZANidine 4 MG tablet Commonly known as: ZANAFLEX Take 4-12 mg by mouth at bedtime.   traZODone 50 MG tablet Commonly known as: DESYREL Take 50-100 mg by mouth at bedtime.   Tresiba 100 UNIT/ML Soln Generic drug: Insulin Degludec Inject 24 Units into the skin in the morning and at bedtime.   zinc sulfate 220 (50 Zn) MG capsule Take 1 capsule (220 mg total) by mouth daily.       ASK your doctor about these medications    ipratropium 0.02 % nebulizer solution Commonly known as: ATROVENT Take 2.5 mLs (0.5 mg total) by  nebulization 4 (four) times daily.        Diagnostic Studies: No results found.  Patient benefited maximally from their hospital stay and there were no complications.     Disposition: Discharge disposition: 03-Skilled Nursing Facility      Discharge Instructions     Apply dressing   Complete by: As directed    Cleanse amputation stump daily with antibacterial soap and water. Apply clean dry dressing and shrinker   Call MD / Call 911   Complete by: As directed    If you experience chest pain or shortness of breath, CALL 911 and be transported to the hospital emergency room.  If you develope a fever above 101 F, pus (white drainage) or increased drainage or redness at the wound, or calf pain, call your surgeon's office.   Call MD / Call 911   Complete by: As directed    If you experience chest pain or shortness of breath, CALL 911 and be transported to the hospital emergency room.  If you develope a fever above 101 F, pus (white drainage) or increased drainage or redness at the wound, or calf pain, call your surgeon's office.   Call MD / Call 911   Complete by: As directed    If you experience chest pain or shortness of breath, CALL 911 and be transported to the hospital emergency room.  If you develope a fever above 101 F, pus (white drainage) or increased drainage or redness at the wound, or calf pain, call your surgeon's office.   Call MD / Call 911   Complete by: As directed    If you experience chest pain or shortness of breath, CALL 911 and be transported to the hospital emergency room.  If you develope a fever above 101 F, pus (white drainage) or increased drainage or redness at the wound, or calf pain, call your surgeon's office.   Call MD / Call 911   Complete by: As directed    If you experience chest pain or shortness of breath, CALL 911 and be transported to the hospital emergency room.  If you develope a fever above 101 F, pus (white drainage) or increased drainage or  redness at the wound, or calf pain, call your surgeon's office.   Constipation Prevention   Complete by: As directed    Drink plenty of fluids.  Prune juice may be helpful.  You may use a stool softener, such as Colace (over the counter) 100 mg twice a day.  Use MiraLax (over the counter) for constipation as needed.   Constipation Prevention   Complete by: As directed    Drink plenty of fluids.  Prune juice may be helpful.  You may use a stool softener, such as Colace (over  the counter) 100 mg twice a day.  Use MiraLax (over the counter) for constipation as needed.   Constipation Prevention   Complete by: As directed    Drink plenty of fluids.  Prune juice may be helpful.  You may use a stool softener, such as Colace (over the counter) 100 mg twice a day.  Use MiraLax (over the counter) for constipation as needed.   Constipation Prevention   Complete by: As directed    Drink plenty of fluids.  Prune juice may be helpful.  You may use a stool softener, such as Colace (over the counter) 100 mg twice a day.  Use MiraLax (over the counter) for constipation as needed.   Constipation Prevention   Complete by: As directed    Drink plenty of fluids.  Prune juice may be helpful.  You may use a stool softener, such as Colace (over the counter) 100 mg twice a day.  Use MiraLax (over the counter) for constipation as needed.   Diet - low sodium heart healthy   Complete by: As directed    Increase activity slowly as tolerated   Complete by: As directed    Increase activity slowly as tolerated   Complete by: As directed    Increase activity slowly as tolerated   Complete by: As directed    Increase activity slowly as tolerated   Complete by: As directed    Increase activity slowly as tolerated   Complete by: As directed    Post-operative opioid taper instructions:   Complete by: As directed    POST-OPERATIVE OPIOID TAPER INSTRUCTIONS: It is important to wean off of your opioid medication as soon as  possible. If you do not need pain medication after your surgery it is ok to stop day one. Opioids include: Codeine, Hydrocodone(Norco, Vicodin), Oxycodone(Percocet, oxycontin) and hydromorphone amongst others.  Long term and even short term use of opiods can cause: Increased pain response Dependence Constipation Depression Respiratory depression And more.  Withdrawal symptoms can include Flu like symptoms Nausea, vomiting And more Techniques to manage these symptoms Hydrate well Eat regular healthy meals Stay active Use relaxation techniques(deep breathing, meditating, yoga) Do Not substitute Alcohol to help with tapering If you have been on opioids for less than two weeks and do not have pain than it is ok to stop all together.  Plan to wean off of opioids This plan should start within one week post op of your joint replacement. Maintain the same interval or time between taking each dose and first decrease the dose.  Cut the total daily intake of opioids by one tablet each day Next start to increase the time between doses. The last dose that should be eliminated is the evening dose.      Post-operative opioid taper instructions:   Complete by: As directed    POST-OPERATIVE OPIOID TAPER INSTRUCTIONS: It is important to wean off of your opioid medication as soon as possible. If you do not need pain medication after your surgery it is ok to stop day one. Opioids include: Codeine, Hydrocodone(Norco, Vicodin), Oxycodone(Percocet, oxycontin) and hydromorphone amongst others.  Long term and even short term use of opiods can cause: Increased pain response Dependence Constipation Depression Respiratory depression And more.  Withdrawal symptoms can include Flu like symptoms Nausea, vomiting And more Techniques to manage these symptoms Hydrate well Eat regular healthy meals Stay active Use relaxation techniques(deep breathing, meditating, yoga) Do Not substitute Alcohol to  help with tapering If you have been on  opioids for less than two weeks and do not have pain than it is ok to stop all together.  Plan to wean off of opioids This plan should start within one week post op of your joint replacement. Maintain the same interval or time between taking each dose and first decrease the dose.  Cut the total daily intake of opioids by one tablet each day Next start to increase the time between doses. The last dose that should be eliminated is the evening dose.      Post-operative opioid taper instructions:   Complete by: As directed    POST-OPERATIVE OPIOID TAPER INSTRUCTIONS: It is important to wean off of your opioid medication as soon as possible. If you do not need pain medication after your surgery it is ok to stop day one. Opioids include: Codeine, Hydrocodone(Norco, Vicodin), Oxycodone(Percocet, oxycontin) and hydromorphone amongst others.  Long term and even short term use of opiods can cause: Increased pain response Dependence Constipation Depression Respiratory depression And more.  Withdrawal symptoms can include Flu like symptoms Nausea, vomiting And more Techniques to manage these symptoms Hydrate well Eat regular healthy meals Stay active Use relaxation techniques(deep breathing, meditating, yoga) Do Not substitute Alcohol to help with tapering If you have been on opioids for less than two weeks and do not have pain than it is ok to stop all together.  Plan to wean off of opioids This plan should start within one week post op of your joint replacement. Maintain the same interval or time between taking each dose and first decrease the dose.  Cut the total daily intake of opioids by one tablet each day Next start to increase the time between doses. The last dose that should be eliminated is the evening dose.      Post-operative opioid taper instructions:   Complete by: As directed    POST-OPERATIVE OPIOID TAPER INSTRUCTIONS: It is  important to wean off of your opioid medication as soon as possible. If you do not need pain medication after your surgery it is ok to stop day one. Opioids include: Codeine, Hydrocodone(Norco, Vicodin), Oxycodone(Percocet, oxycontin) and hydromorphone amongst others.  Long term and even short term use of opiods can cause: Increased pain response Dependence Constipation Depression Respiratory depression And more.  Withdrawal symptoms can include Flu like symptoms Nausea, vomiting And more Techniques to manage these symptoms Hydrate well Eat regular healthy meals Stay active Use relaxation techniques(deep breathing, meditating, yoga) Do Not substitute Alcohol to help with tapering If you have been on opioids for less than two weeks and do not have pain than it is ok to stop all together.  Plan to wean off of opioids This plan should start within one week post op of your joint replacement. Maintain the same interval or time between taking each dose and first decrease the dose.  Cut the total daily intake of opioids by one tablet each day Next start to increase the time between doses. The last dose that should be eliminated is the evening dose.      Post-operative opioid taper instructions:   Complete by: As directed    POST-OPERATIVE OPIOID TAPER INSTRUCTIONS: It is important to wean off of your opioid medication as soon as possible. If you do not need pain medication after your surgery it is ok to stop day one. Opioids include: Codeine, Hydrocodone(Norco, Vicodin), Oxycodone(Percocet, oxycontin) and hydromorphone amongst others.  Long term and even short term use of opiods can cause: Increased pain response Dependence Constipation  Depression Respiratory depression And more.  Withdrawal symptoms can include Flu like symptoms Nausea, vomiting And more Techniques to manage these symptoms Hydrate well Eat regular healthy meals Stay active Use relaxation techniques(deep  breathing, meditating, yoga) Do Not substitute Alcohol to help with tapering If you have been on opioids for less than two weeks and do not have pain than it is ok to stop all together.  Plan to wean off of opioids This plan should start within one week post op of your joint replacement. Maintain the same interval or time between taking each dose and first decrease the dose.  Cut the total daily intake of opioids by one tablet each day Next start to increase the time between doses. The last dose that should be eliminated is the evening dose.          Follow-up Information     Carleah Yablonski, [place], [place] Follow up in 1 week(s).   Specialty: Orthopedic Surgery Contact information: [address] [place] [address] [phone]                  Signed: Bevely Palmer Nakeitha Milligan 02/02/2021, 6:37 AM

## 2021-02-05 ENCOUNTER — Ambulatory Visit (INDEPENDENT_AMBULATORY_CARE_PROVIDER_SITE_OTHER): Payer: Medicare HMO | Admitting: Orthopedic Surgery

## 2021-02-05 ENCOUNTER — Other Ambulatory Visit: Payer: Self-pay

## 2021-02-05 DIAGNOSIS — Z89612 Acquired absence of left leg above knee: Secondary | ICD-10-CM

## 2021-02-05 DIAGNOSIS — L0211 Cutaneous abscess of neck: Secondary | ICD-10-CM

## 2021-02-05 MED ORDER — DOXYCYCLINE HYCLATE 100 MG PO TABS: 100.0000 mg | ORAL_TABLET | Freq: Two times a day (BID) | ORAL | 0 refills | Status: DC

## 2021-02-08 LAB — WOUND CULTURE
MICRO NUMBER:: 12260921
SPECIMEN QUALITY:: ADEQUATE

## 2021-02-11 ENCOUNTER — Telehealth: Payer: Self-pay | Admitting: Radiology

## 2021-02-11 ENCOUNTER — Encounter (HOSPITAL_COMMUNITY): Payer: Self-pay | Admitting: Emergency Medicine

## 2021-02-11 ENCOUNTER — Emergency Department (HOSPITAL_COMMUNITY): Payer: Medicare HMO

## 2021-02-11 ENCOUNTER — Other Ambulatory Visit: Payer: Self-pay

## 2021-02-11 ENCOUNTER — Observation Stay (HOSPITAL_COMMUNITY)
Admission: EM | Admit: 2021-02-11 | Discharge: 2021-02-13 | DRG: 872 | Disposition: A | Discharge status: Home Health | Payer: Medicare HMO | Attending: Family Medicine | Admitting: Family Medicine

## 2021-02-11 DIAGNOSIS — Z794 Long term (current) use of insulin: Secondary | ICD-10-CM

## 2021-02-11 DIAGNOSIS — I248 Other forms of acute ischemic heart disease: Secondary | ICD-10-CM | POA: Diagnosis present

## 2021-02-11 DIAGNOSIS — Z833 Family history of diabetes mellitus: Secondary | ICD-10-CM

## 2021-02-11 DIAGNOSIS — Z885 Allergy status to narcotic agent status: Secondary | ICD-10-CM

## 2021-02-11 DIAGNOSIS — Z8249 Family history of ischemic heart disease and other diseases of the circulatory system: Secondary | ICD-10-CM

## 2021-02-11 DIAGNOSIS — Z79899 Other long term (current) drug therapy: Secondary | ICD-10-CM

## 2021-02-11 DIAGNOSIS — E1165 Type 2 diabetes mellitus with hyperglycemia: Secondary | ICD-10-CM | POA: Diagnosis not present

## 2021-02-11 DIAGNOSIS — Z7984 Long term (current) use of oral hypoglycemic drugs: Secondary | ICD-10-CM | POA: Insufficient documentation

## 2021-02-11 DIAGNOSIS — A4102 Sepsis due to Methicillin resistant Staphylococcus aureus: Secondary | ICD-10-CM | POA: Diagnosis not present

## 2021-02-11 DIAGNOSIS — I5032 Chronic diastolic (congestive) heart failure: Secondary | ICD-10-CM | POA: Diagnosis not present

## 2021-02-11 DIAGNOSIS — K219 Gastro-esophageal reflux disease without esophagitis: Secondary | ICD-10-CM | POA: Diagnosis present

## 2021-02-11 DIAGNOSIS — Z89421 Acquired absence of other right toe(s): Secondary | ICD-10-CM | POA: Insufficient documentation

## 2021-02-11 DIAGNOSIS — R778 Other specified abnormalities of plasma proteins: Secondary | ICD-10-CM | POA: Diagnosis not present

## 2021-02-11 DIAGNOSIS — A419 Sepsis, unspecified organism: Secondary | ICD-10-CM | POA: Diagnosis present

## 2021-02-11 DIAGNOSIS — Z89411 Acquired absence of right great toe: Secondary | ICD-10-CM

## 2021-02-11 DIAGNOSIS — J449 Chronic obstructive pulmonary disease, unspecified: Secondary | ICD-10-CM | POA: Diagnosis present

## 2021-02-11 DIAGNOSIS — I10 Essential (primary) hypertension: Secondary | ICD-10-CM | POA: Diagnosis present

## 2021-02-11 DIAGNOSIS — I959 Hypotension, unspecified: Secondary | ICD-10-CM | POA: Diagnosis not present

## 2021-02-11 DIAGNOSIS — I499 Cardiac arrhythmia, unspecified: Secondary | ICD-10-CM | POA: Diagnosis present

## 2021-02-11 DIAGNOSIS — L03221 Cellulitis of neck: Secondary | ICD-10-CM | POA: Diagnosis not present

## 2021-02-11 DIAGNOSIS — I110 Hypertensive heart disease with heart failure: Secondary | ICD-10-CM | POA: Diagnosis not present

## 2021-02-11 DIAGNOSIS — S98111A Complete traumatic amputation of right great toe, initial encounter: Secondary | ICD-10-CM | POA: Diagnosis present

## 2021-02-11 DIAGNOSIS — X58XXXA Exposure to other specified factors, initial encounter: Secondary | ICD-10-CM | POA: Insufficient documentation

## 2021-02-11 DIAGNOSIS — F17210 Nicotine dependence, cigarettes, uncomplicated: Secondary | ICD-10-CM | POA: Diagnosis present

## 2021-02-11 DIAGNOSIS — Z720 Tobacco use: Secondary | ICD-10-CM | POA: Diagnosis present

## 2021-02-11 DIAGNOSIS — Z20822 Contact with and (suspected) exposure to covid-19: Secondary | ICD-10-CM | POA: Diagnosis present

## 2021-02-11 DIAGNOSIS — Z89612 Acquired absence of left leg above knee: Secondary | ICD-10-CM | POA: Diagnosis not present

## 2021-02-11 DIAGNOSIS — F32A Depression, unspecified: Secondary | ICD-10-CM | POA: Diagnosis present

## 2021-02-11 DIAGNOSIS — S91301A Unspecified open wound, right foot, initial encounter: Secondary | ICD-10-CM | POA: Diagnosis not present

## 2021-02-11 DIAGNOSIS — K760 Fatty (change of) liver, not elsewhere classified: Secondary | ICD-10-CM | POA: Diagnosis present

## 2021-02-11 DIAGNOSIS — F4310 Post-traumatic stress disorder, unspecified: Secondary | ICD-10-CM | POA: Diagnosis present

## 2021-02-11 DIAGNOSIS — R7989 Other specified abnormal findings of blood chemistry: Secondary | ICD-10-CM

## 2021-02-11 DIAGNOSIS — Z7982 Long term (current) use of aspirin: Secondary | ICD-10-CM

## 2021-02-11 DIAGNOSIS — Z89512 Acquired absence of left leg below knee: Secondary | ICD-10-CM

## 2021-02-11 DIAGNOSIS — E1151 Type 2 diabetes mellitus with diabetic peripheral angiopathy without gangrene: Secondary | ICD-10-CM | POA: Insufficient documentation

## 2021-02-11 DIAGNOSIS — F419 Anxiety disorder, unspecified: Secondary | ICD-10-CM | POA: Diagnosis present

## 2021-02-11 DIAGNOSIS — E11621 Type 2 diabetes mellitus with foot ulcer: Secondary | ICD-10-CM | POA: Diagnosis present

## 2021-02-11 DIAGNOSIS — Z888 Allergy status to other drugs, medicaments and biological substances status: Secondary | ICD-10-CM

## 2021-02-11 DIAGNOSIS — R6510 Systemic inflammatory response syndrome (SIRS) of non-infectious origin without acute organ dysfunction: Secondary | ICD-10-CM | POA: Diagnosis present

## 2021-02-11 DIAGNOSIS — E785 Hyperlipidemia, unspecified: Secondary | ICD-10-CM | POA: Diagnosis present

## 2021-02-11 DIAGNOSIS — E119 Type 2 diabetes mellitus without complications: Secondary | ICD-10-CM

## 2021-02-11 LAB — RESP PANEL BY RT-PCR (FLU A&B, COVID) ARPGX2
Influenza A by PCR: NEGATIVE
Influenza B by PCR: NEGATIVE
SARS Coronavirus 2 by RT PCR: NEGATIVE

## 2021-02-11 LAB — CBC WITH DIFFERENTIAL/PLATELET
Abs Immature Granulocytes: 0.1 10*3/uL — ABNORMAL HIGH (ref 0.00–0.07)
Basophils Absolute: 0.1 10*3/uL (ref 0.0–0.1)
Basophils Relative: 1 %
Eosinophils Absolute: 0.1 10*3/uL (ref 0.0–0.5)
Eosinophils Relative: 1 %
HCT: 42.5 % (ref 39.0–52.0)
Hemoglobin: 14.1 g/dL (ref 13.0–17.0)
Immature Granulocytes: 1 %
Lymphocytes Relative: 17 %
Lymphs Abs: 2.9 10*3/uL (ref 0.7–4.0)
MCH: 29.4 pg (ref 26.0–34.0)
MCHC: 33.2 g/dL (ref 30.0–36.0)
MCV: 88.7 fL (ref 80.0–100.0)
Monocytes Absolute: 1.4 10*3/uL — ABNORMAL HIGH (ref 0.1–1.0)
Monocytes Relative: 8 %
Neutro Abs: 13.1 10*3/uL — ABNORMAL HIGH (ref 1.7–7.7)
Neutrophils Relative %: 72 %
Platelets: 267 10*3/uL (ref 150–400)
RBC: 4.79 MIL/uL (ref 4.22–5.81)
RDW: 12.9 % (ref 11.5–15.5)
WBC: 17.6 10*3/uL — ABNORMAL HIGH (ref 4.0–10.5)
nRBC: 0 % (ref 0.0–0.2)

## 2021-02-11 LAB — BASIC METABOLIC PANEL
Anion gap: 11 (ref 5–15)
BUN: 34 mg/dL — ABNORMAL HIGH (ref 6–20)
CO2: 24 mmol/L (ref 22–32)
Calcium: 8.8 mg/dL — ABNORMAL LOW (ref 8.9–10.3)
Chloride: 101 mmol/L (ref 98–111)
Creatinine, Ser: 0.87 mg/dL (ref 0.61–1.24)
GFR, Estimated: >60 mL/min (ref >60)
Glucose, Bld: 253 mg/dL — ABNORMAL HIGH (ref 70–99)
Potassium: 4.3 mmol/L (ref 3.5–5.1)
Sodium: 136 mmol/L (ref 135–145)

## 2021-02-11 LAB — URINALYSIS, ROUTINE W REFLEX MICROSCOPIC
Bacteria, UA: NONE SEEN
Bilirubin Urine: NEGATIVE
Glucose, UA: >=500 mg/dL — AB
Hgb urine dipstick: NEGATIVE
Ketones, ur: NEGATIVE mg/dL
Leukocytes,Ua: NEGATIVE
Nitrite: NEGATIVE
Protein, ur: 30 mg/dL — AB
Specific Gravity, Urine: 1.026 (ref 1.005–1.030)
pH: 6 (ref 5.0–8.0)

## 2021-02-11 LAB — HEPATIC FUNCTION PANEL
ALT: 15 U/L (ref 0–44)
AST: 20 U/L (ref 15–41)
Albumin: 3.3 g/dL — ABNORMAL LOW (ref 3.5–5.0)
Alkaline Phosphatase: 84 U/L (ref 38–126)
Bilirubin, Direct: <0.1 mg/dL (ref 0.0–0.2)
Total Bilirubin: 0.5 mg/dL (ref 0.3–1.2)
Total Protein: 6.4 g/dL — ABNORMAL LOW (ref 6.5–8.1)

## 2021-02-11 LAB — LACTIC ACID, PLASMA
Lactic Acid, Venous: 3.2 mmol/L (ref 0.5–1.9)
Lactic Acid, Venous: 2.7 mmol/L (ref 0.5–1.9)

## 2021-02-11 LAB — TROPONIN I (HIGH SENSITIVITY)
Troponin I (High Sensitivity): 70 ng/L — ABNORMAL HIGH (ref <18)
Troponin I (High Sensitivity): 81 ng/L — ABNORMAL HIGH (ref <18)

## 2021-02-11 LAB — LIPASE, BLOOD: Lipase: 20 U/L (ref 11–51)

## 2021-02-11 MED ORDER — SODIUM CHLORIDE 0.9 % IV BOLUS
1000.0000 mL | Freq: Once | INTRAVENOUS | Status: AC
  Administered 2021-02-11: 1000 mL via INTRAVENOUS

## 2021-02-11 MED ORDER — PIPERACILLIN-TAZOBACTAM 3.375 G IVPB: 3.3750 g | Freq: Three times a day (TID) | INTRAVENOUS | Status: DC

## 2021-02-11 MED ORDER — VANCOMYCIN HCL 1250 MG/250ML IV SOLN
1250.0000 mg | Freq: Two times a day (BID) | INTRAVENOUS | Status: DC
  Administered 2021-02-12 – 2021-02-13 (×3): 1250 mg via INTRAVENOUS
  Filled 2021-02-11 (×3): qty 250

## 2021-02-11 MED ORDER — IOHEXOL 350 MG/ML SOLN
100.0000 mL | Freq: Once | INTRAVENOUS | Status: AC | PRN
  Administered 2021-02-11: 60 mL via INTRAVENOUS

## 2021-02-11 MED ORDER — VANCOMYCIN HCL 1500 MG/300ML IV SOLN
1500.0000 mg | Freq: Once | INTRAVENOUS | Status: AC
  Administered 2021-02-11: 1500 mg via INTRAVENOUS
  Filled 2021-02-11: qty 300

## 2021-02-11 MED ORDER — PIPERACILLIN-TAZOBACTAM 3.375 G IVPB 30 MIN
3.3750 g | Freq: Once | INTRAVENOUS | Status: AC
  Administered 2021-02-11: 3.375 g via INTRAVENOUS
  Filled 2021-02-11: qty 50

## 2021-02-11 NOTE — ED Triage Notes (Signed)
Pt to the ED with HR in the 180's and hypotension.  Pt was given 6 mg adenosine in route and a 500 bolus in route
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
## 2021-02-11 NOTE — Progress Notes (Signed)
Pharmacy Antibiotic Note  Troy [name] is a 60 y.o. male admitted on 02/11/2021 with sepsis.  Pharmacy has been consulted for vancomycin and zosyn dosing. Patient presenting to [facility] ED with HR in the 180s and hypotensive per initial evaluation. Tmax 98.9, WBC 17.6, and LA 3.2.   Plan: Zosyn 3.375 G IV Q8H  Vancomycin 1500 MG IV once followed by vancomycin 1250 MG IV Q12H (Scr 0.87, estAUC 477)  Monitor clinical status, renal function, and vancomycin levels as indicated  Height: 6' (182.9 cm) Weight: 83.9 kg (185 lb) IBW/kg (Calculated) : 77.6  Temp (24hrs), Avg:98.9 F (37.2 C), Min:98.9 F (37.2 C), Max:98.9 F (37.2 C)  Recent Labs  Lab 02/11/21 1615 02/11/21 1717  WBC 17.6*  --   CREATININE 0.87  --   LATICACIDVEN  --  3.2*    Estimated Creatinine Clearance: 99.1 mL/min (by C-G formula based on SCr of 0.87 mg/dL).    Allergies  Allergen Reactions   Hydromorphone Hcl Er Anaphylaxis and Other (See Comments)    Allergic to DYE in extended-release tablet, can tolerate other forms of hydromorphone   Tapentadol Anaphylaxis, Swelling and Other (See Comments)    THROAT ANGIOEDEMA Nucynta [Tapentadol Hydrochloride]   Exalamide Other (See Comments)    UNSPECIFIED REACTION     Antimicrobials this admission: 8/24 Zosyn >>  8/24 Vancomycin >>  Dose adjustments this admission: NA  Microbiology results: 8/24 BCx: sent   Thank you for allowing pharmacy to be a part of this patient's care.  Cephus Slater, PharmD, [facility] Resident [phone] 02/11/2021 7:16 PM

## 2021-02-11 NOTE — Telephone Encounter (Signed)
Troy [name] with [facility] called triage line. Patient is status post AKA on 01/21/2021. He came home on 02/09/2021 and she went out to see him for first visit at home today. She has sent him by EMS to hospital [facility] due to him living in [place]).  Patient's blood pressure was 80/56 in both arms and he had a heart rate of 170-180. He was cold and clammy but had no outward symptoms (he was alert and denied chest pain, etc.).    Autumn-Tina would like for you to call her at [phone] as she usually speaks with you and would like to know if you need her to do anything further.

## 2021-02-12 ENCOUNTER — Inpatient Hospital Stay (HOSPITAL_COMMUNITY): Payer: Medicare HMO

## 2021-02-12 ENCOUNTER — Encounter (HOSPITAL_COMMUNITY): Payer: Self-pay | Admitting: Internal Medicine

## 2021-02-12 DIAGNOSIS — R778 Other specified abnormalities of plasma proteins: Secondary | ICD-10-CM | POA: Diagnosis present

## 2021-02-12 DIAGNOSIS — K760 Fatty (change of) liver, not elsewhere classified: Secondary | ICD-10-CM | POA: Diagnosis present

## 2021-02-12 DIAGNOSIS — Z7982 Long term (current) use of aspirin: Secondary | ICD-10-CM | POA: Diagnosis not present

## 2021-02-12 DIAGNOSIS — Z20822 Contact with and (suspected) exposure to covid-19: Secondary | ICD-10-CM | POA: Diagnosis present

## 2021-02-12 DIAGNOSIS — S98111A Complete traumatic amputation of right great toe, initial encounter: Secondary | ICD-10-CM | POA: Diagnosis present

## 2021-02-12 DIAGNOSIS — K219 Gastro-esophageal reflux disease without esophagitis: Secondary | ICD-10-CM | POA: Diagnosis present

## 2021-02-12 DIAGNOSIS — I959 Hypotension, unspecified: Secondary | ICD-10-CM | POA: Insufficient documentation

## 2021-02-12 DIAGNOSIS — A4102 Sepsis due to Methicillin resistant Staphylococcus aureus: Secondary | ICD-10-CM | POA: Diagnosis not present

## 2021-02-12 DIAGNOSIS — R6510 Systemic inflammatory response syndrome (SIRS) of non-infectious origin without acute organ dysfunction: Secondary | ICD-10-CM

## 2021-02-12 DIAGNOSIS — Z885 Allergy status to narcotic agent status: Secondary | ICD-10-CM | POA: Diagnosis not present

## 2021-02-12 DIAGNOSIS — E11621 Type 2 diabetes mellitus with foot ulcer: Secondary | ICD-10-CM | POA: Diagnosis present

## 2021-02-12 DIAGNOSIS — Z8249 Family history of ischemic heart disease and other diseases of the circulatory system: Secondary | ICD-10-CM | POA: Diagnosis not present

## 2021-02-12 DIAGNOSIS — I248 Other forms of acute ischemic heart disease: Secondary | ICD-10-CM | POA: Diagnosis present

## 2021-02-12 DIAGNOSIS — L03221 Cellulitis of neck: Secondary | ICD-10-CM | POA: Diagnosis not present

## 2021-02-12 DIAGNOSIS — A419 Sepsis, unspecified organism: Secondary | ICD-10-CM | POA: Diagnosis not present

## 2021-02-12 DIAGNOSIS — I5032 Chronic diastolic (congestive) heart failure: Secondary | ICD-10-CM | POA: Diagnosis present

## 2021-02-12 DIAGNOSIS — F4310 Post-traumatic stress disorder, unspecified: Secondary | ICD-10-CM | POA: Diagnosis present

## 2021-02-12 DIAGNOSIS — Z89411 Acquired absence of right great toe: Secondary | ICD-10-CM | POA: Diagnosis not present

## 2021-02-12 DIAGNOSIS — Z89512 Acquired absence of left leg below knee: Secondary | ICD-10-CM

## 2021-02-12 DIAGNOSIS — J449 Chronic obstructive pulmonary disease, unspecified: Secondary | ICD-10-CM | POA: Diagnosis present

## 2021-02-12 DIAGNOSIS — E785 Hyperlipidemia, unspecified: Secondary | ICD-10-CM | POA: Diagnosis present

## 2021-02-12 DIAGNOSIS — I499 Cardiac arrhythmia, unspecified: Secondary | ICD-10-CM | POA: Diagnosis not present

## 2021-02-12 DIAGNOSIS — E1169 Type 2 diabetes mellitus with other specified complication: Secondary | ICD-10-CM

## 2021-02-12 DIAGNOSIS — F32A Depression, unspecified: Secondary | ICD-10-CM | POA: Diagnosis present

## 2021-02-12 DIAGNOSIS — F419 Anxiety disorder, unspecified: Secondary | ICD-10-CM | POA: Diagnosis present

## 2021-02-12 DIAGNOSIS — Z89612 Acquired absence of left leg above knee: Secondary | ICD-10-CM

## 2021-02-12 DIAGNOSIS — Z720 Tobacco use: Secondary | ICD-10-CM

## 2021-02-12 DIAGNOSIS — I9589 Other hypotension: Secondary | ICD-10-CM

## 2021-02-12 DIAGNOSIS — I110 Hypertensive heart disease with heart failure: Secondary | ICD-10-CM | POA: Diagnosis present

## 2021-02-12 DIAGNOSIS — Z888 Allergy status to other drugs, medicaments and biological substances status: Secondary | ICD-10-CM | POA: Diagnosis not present

## 2021-02-12 DIAGNOSIS — F17210 Nicotine dependence, cigarettes, uncomplicated: Secondary | ICD-10-CM | POA: Diagnosis present

## 2021-02-12 DIAGNOSIS — Z79899 Other long term (current) drug therapy: Secondary | ICD-10-CM | POA: Diagnosis not present

## 2021-02-12 DIAGNOSIS — E1165 Type 2 diabetes mellitus with hyperglycemia: Secondary | ICD-10-CM | POA: Diagnosis present

## 2021-02-12 DIAGNOSIS — Z833 Family history of diabetes mellitus: Secondary | ICD-10-CM | POA: Diagnosis not present

## 2021-02-12 HISTORY — DX: Complete traumatic amputation of right great toe, initial encounter: S98.111A

## 2021-02-12 LAB — GLUCOSE, CAPILLARY
Glucose-Capillary: 227 mg/dL — ABNORMAL HIGH (ref 70–99)
Glucose-Capillary: 175 mg/dL — ABNORMAL HIGH (ref 70–99)
Glucose-Capillary: 227 mg/dL — ABNORMAL HIGH (ref 70–99)
Glucose-Capillary: 209 mg/dL — ABNORMAL HIGH (ref 70–99)
Glucose-Capillary: 182 mg/dL — ABNORMAL HIGH (ref 70–99)

## 2021-02-12 LAB — ECHOCARDIOGRAM COMPLETE
AR max vel: 2.79 cm2
AV Area VTI: 2.94 cm2
AV Area mean vel: 2.4 cm2
AV Mean grad: 5 mmHg
AV Peak grad: 8.4 mmHg
Ao pk vel: 1.45 m/s
Area-P 1/2: 3.51 cm2
Height: 72 in
MV VTI: 2.78 cm2
S' Lateral: 2 cm
Weight: 2857.16 oz

## 2021-02-12 LAB — BASIC METABOLIC PANEL
Anion gap: 10 (ref 5–15)
BUN: 24 mg/dL — ABNORMAL HIGH (ref 6–20)
CO2: 24 mmol/L (ref 22–32)
Calcium: 8.6 mg/dL — ABNORMAL LOW (ref 8.9–10.3)
Chloride: 104 mmol/L (ref 98–111)
Creatinine, Ser: 0.51 mg/dL — ABNORMAL LOW (ref 0.61–1.24)
GFR, Estimated: >60 mL/min (ref >60)
Glucose, Bld: 147 mg/dL — ABNORMAL HIGH (ref 70–99)
Potassium: 4 mmol/L (ref 3.5–5.1)
Sodium: 138 mmol/L (ref 135–145)

## 2021-02-12 LAB — CBC
HCT: 37 % — ABNORMAL LOW (ref 39.0–52.0)
Hemoglobin: 12.1 g/dL — ABNORMAL LOW (ref 13.0–17.0)
MCH: 28.5 pg (ref 26.0–34.0)
MCHC: 32.7 g/dL (ref 30.0–36.0)
MCV: 87.1 fL (ref 80.0–100.0)
Platelets: 183 10*3/uL (ref 150–400)
RBC: 4.25 MIL/uL (ref 4.22–5.81)
RDW: 12.7 % (ref 11.5–15.5)
WBC: 14.2 10*3/uL — ABNORMAL HIGH (ref 4.0–10.5)
nRBC: 0 % (ref 0.0–0.2)

## 2021-02-12 LAB — TROPONIN I (HIGH SENSITIVITY)
Troponin I (High Sensitivity): 152 ng/L (ref <18)
Troponin I (High Sensitivity): 118 ng/L (ref <18)

## 2021-02-12 LAB — HIV ANTIBODY (ROUTINE TESTING W REFLEX): HIV Screen 4th Generation wRfx: NONREACTIVE

## 2021-02-12 LAB — MRSA NEXT GEN BY PCR, NASAL: MRSA by PCR Next Gen: DETECTED — AB

## 2021-02-12 MED ORDER — GABAPENTIN 300 MG PO CAPS
300.0000 mg | ORAL_CAPSULE | Freq: Four times a day (QID) | ORAL | Status: DC
  Administered 2021-02-12 – 2021-02-13 (×6): 300 mg via ORAL
  Filled 2021-02-12 (×6): qty 1

## 2021-02-12 MED ORDER — TRAZODONE HCL 50 MG PO TABS
50.0000 mg | ORAL_TABLET | Freq: Every day | ORAL | Status: DC
  Administered 2021-02-12 (×2): 50 mg via ORAL
  Filled 2021-02-12 (×2): qty 1

## 2021-02-12 MED ORDER — INSULIN ASPART 100 UNIT/ML IJ SOLN
0.0000 [IU] | Freq: Three times a day (TID) | INTRAMUSCULAR | Status: DC
  Administered 2021-02-12: 3 [IU] via SUBCUTANEOUS
  Administered 2021-02-12 (×2): 5 [IU] via SUBCUTANEOUS
  Administered 2021-02-13: 3 [IU] via SUBCUTANEOUS
  Administered 2021-02-13: 5 [IU] via SUBCUTANEOUS

## 2021-02-12 MED ORDER — MUPIROCIN 2 % EX OINT
TOPICAL_OINTMENT | Freq: Two times a day (BID) | CUTANEOUS | Status: DC
  Filled 2021-02-12 (×2): qty 22

## 2021-02-12 MED ORDER — PNEUMOCOCCAL VAC POLYVALENT 25 MCG/0.5ML IJ INJ
0.5000 mL | INJECTION | INTRAMUSCULAR | Status: DC
  Filled 2021-02-12: qty 0.5

## 2021-02-12 MED ORDER — ASPIRIN EC 81 MG PO TBEC
81.0000 mg | DELAYED_RELEASE_TABLET | Freq: Every day | ORAL | Status: DC
  Administered 2021-02-12 – 2021-02-13 (×2): 81 mg via ORAL
  Filled 2021-02-12 (×2): qty 1

## 2021-02-12 MED ORDER — ONDANSETRON HCL 4 MG/2ML IJ SOLN: 4.0000 mg | Freq: Four times a day (QID) | INTRAMUSCULAR | Status: DC | PRN

## 2021-02-12 MED ORDER — ENOXAPARIN SODIUM 40 MG/0.4ML IJ SOSY
40.0000 mg | PREFILLED_SYRINGE | INTRAMUSCULAR | Status: DC
  Administered 2021-02-12 – 2021-02-13 (×2): 40 mg via SUBCUTANEOUS
  Filled 2021-02-12 (×2): qty 0.4

## 2021-02-12 MED ORDER — AMITRIPTYLINE HCL 25 MG PO TABS
150.0000 mg | ORAL_TABLET | Freq: Every day | ORAL | Status: DC
  Administered 2021-02-12 (×2): 150 mg via ORAL
  Filled 2021-02-12 (×2): qty 6

## 2021-02-12 MED ORDER — INSULIN GLARGINE-YFGN 100 UNIT/ML ~~LOC~~ SOLN
24.0000 [IU] | Freq: Two times a day (BID) | SUBCUTANEOUS | Status: DC
  Administered 2021-02-12 – 2021-02-13 (×4): 24 [IU] via SUBCUTANEOUS
  Filled 2021-02-12 (×10): qty 0.24

## 2021-02-12 MED ORDER — CHLORHEXIDINE GLUCONATE CLOTH 2 % EX PADS
6.0000 | MEDICATED_PAD | Freq: Every day | CUTANEOUS | Status: DC
  Administered 2021-02-12 – 2021-02-13 (×2): 6 via TOPICAL

## 2021-02-12 MED ORDER — SODIUM CHLORIDE 0.9 % IV SOLN
2.0000 g | Freq: Three times a day (TID) | INTRAVENOUS | Status: DC
  Administered 2021-02-12 – 2021-02-13 (×5): 2 g via INTRAVENOUS
  Filled 2021-02-12 (×5): qty 2

## 2021-02-12 MED ORDER — ACETAMINOPHEN 650 MG RE SUPP: 650.0000 mg | Freq: Four times a day (QID) | RECTAL | Status: DC | PRN

## 2021-02-12 MED ORDER — TIZANIDINE HCL 2 MG PO TABS
4.0000 mg | ORAL_TABLET | Freq: Every day | ORAL | Status: DC
  Administered 2021-02-12 (×2): 4 mg via ORAL
  Filled 2021-02-12 (×2): qty 2

## 2021-02-12 MED ORDER — OXYCODONE HCL 5 MG PO TABS
5.0000 mg | ORAL_TABLET | Freq: Four times a day (QID) | ORAL | Status: DC | PRN
  Administered 2021-02-12 – 2021-02-13 (×3): 5 mg via ORAL
  Filled 2021-02-12 (×3): qty 1

## 2021-02-12 MED ORDER — METRONIDAZOLE 500 MG/100ML IV SOLN
500.0000 mg | Freq: Two times a day (BID) | INTRAVENOUS | Status: DC
  Administered 2021-02-12 – 2021-02-13 (×4): 500 mg via INTRAVENOUS
  Filled 2021-02-12 (×4): qty 100

## 2021-02-12 MED ORDER — POLYETHYLENE GLYCOL 3350 17 G PO PACK: 17.0000 g | PACK | Freq: Every day | ORAL | Status: DC | PRN

## 2021-02-12 MED ORDER — OXYCODONE-ACETAMINOPHEN 5-325 MG PO TABS
1.0000 | ORAL_TABLET | Freq: Four times a day (QID) | ORAL | Status: DC | PRN
Start: 2021-02-12 — End: 2021-02-13
  Administered 2021-02-12 (×4): 1 via ORAL
  Filled 2021-02-12 (×5): qty 1

## 2021-02-12 MED ORDER — INSULIN ASPART 100 UNIT/ML IJ SOLN
0.0000 [IU] | Freq: Every day | INTRAMUSCULAR | Status: DC
  Administered 2021-02-12: 2 [IU] via SUBCUTANEOUS

## 2021-02-12 MED ORDER — SODIUM CHLORIDE 0.9 % IV SOLN
INTRAVENOUS | Status: DC | PRN
  Administered 2021-02-12: 500 mL via INTRAVENOUS

## 2021-02-12 MED ORDER — ACETAMINOPHEN 325 MG PO TABS: 650.0000 mg | ORAL_TABLET | Freq: Four times a day (QID) | ORAL | Status: DC | PRN

## 2021-02-12 MED ORDER — ONDANSETRON HCL 4 MG PO TABS: 4.0000 mg | ORAL_TABLET | Freq: Four times a day (QID) | ORAL | Status: DC | PRN

## 2021-02-12 MED ORDER — OXYCODONE-ACETAMINOPHEN 10-325 MG PO TABS
1.0000 | ORAL_TABLET | Freq: Four times a day (QID) | ORAL | Status: DC | PRN
Start: 2021-02-12 — End: 2021-02-12

## 2021-02-12 NOTE — Progress Notes (Signed)
Date and time results received: 02/12/21 0930  Test: Troponin Critical Value: 118  Name of Provider Notified: Dr. Joesph Fillers  Orders Received? Or Actions Taken?: Trending down from previous results. Will relay to primary RN

## 2021-02-12 NOTE — Progress Notes (Signed)
*  PRELIMINARY RESULTS* Echocardiogram 2D Echocardiogram has been performed.  Troy [name] 02/12/2021, 10:14 AM
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
## 2021-02-12 NOTE — Plan of Care (Signed)

## 2021-02-12 NOTE — Plan of Care (Signed)

## 2021-02-12 NOTE — [place] Notes (Signed)
Date and time results received: 02/12/21 0750 (use smartphrase ".now" to insert current time)  Test: MRSA Critical Value: Positive  Name of Provider Notified: Dr Joesph Fillers  Orders Received? Or Actions Taken?: Mupirocin ointment ordered

## 2021-02-12 NOTE — Telephone Encounter (Signed)
Dr. Sharol Given reviewed chart and pt will be inpatient admission at [facility] for at least 2 days per chart note. Will hold message to monitor and follow up with nursing and patient once pt has d/c

## 2021-02-13 DIAGNOSIS — I959 Hypotension, unspecified: Secondary | ICD-10-CM | POA: Diagnosis not present

## 2021-02-13 DIAGNOSIS — I499 Cardiac arrhythmia, unspecified: Secondary | ICD-10-CM | POA: Diagnosis not present

## 2021-02-13 DIAGNOSIS — L03221 Cellulitis of neck: Secondary | ICD-10-CM | POA: Diagnosis not present

## 2021-02-13 DIAGNOSIS — A419 Sepsis, unspecified organism: Secondary | ICD-10-CM | POA: Diagnosis not present

## 2021-02-13 DIAGNOSIS — A4102 Sepsis due to Methicillin resistant Staphylococcus aureus: Secondary | ICD-10-CM | POA: Diagnosis not present

## 2021-02-13 LAB — BASIC METABOLIC PANEL
Anion gap: 5 (ref 5–15)
BUN: 16 mg/dL (ref 6–20)
CO2: 24 mmol/L (ref 22–32)
Calcium: 8.1 mg/dL — ABNORMAL LOW (ref 8.9–10.3)
Chloride: 108 mmol/L (ref 98–111)
Creatinine, Ser: 0.55 mg/dL — ABNORMAL LOW (ref 0.61–1.24)
GFR, Estimated: >60 mL/min (ref >60)
Glucose, Bld: 160 mg/dL — ABNORMAL HIGH (ref 70–99)
Potassium: 3.9 mmol/L (ref 3.5–5.1)
Sodium: 137 mmol/L (ref 135–145)

## 2021-02-13 LAB — CBC
HCT: 39 % (ref 39.0–52.0)
Hemoglobin: 12.6 g/dL — ABNORMAL LOW (ref 13.0–17.0)
MCH: 28.1 pg (ref 26.0–34.0)
MCHC: 32.3 g/dL (ref 30.0–36.0)
MCV: 87.1 fL (ref 80.0–100.0)
Platelets: 166 10*3/uL (ref 150–400)
RBC: 4.48 MIL/uL (ref 4.22–5.81)
RDW: 12.4 % (ref 11.5–15.5)
WBC: 9.7 10*3/uL (ref 4.0–10.5)
nRBC: 0 % (ref 0.0–0.2)

## 2021-02-13 LAB — GLUCOSE, CAPILLARY
Glucose-Capillary: 179 mg/dL — ABNORMAL HIGH (ref 70–99)
Glucose-Capillary: 212 mg/dL — ABNORMAL HIGH (ref 70–99)

## 2021-02-13 LAB — TROPONIN I (HIGH SENSITIVITY): Troponin I (High Sensitivity): 76 ng/L — ABNORMAL HIGH (ref <18)

## 2021-02-13 LAB — URINE CULTURE: Culture: NO GROWTH

## 2021-02-13 MED ORDER — ACETAMINOPHEN 325 MG PO TABS: 650.0000 mg | ORAL_TABLET | Freq: Four times a day (QID) | ORAL | 0 refills | Status: DC | PRN

## 2021-02-13 MED ORDER — ALBUTEROL SULFATE HFA 108 (90 BASE) MCG/ACT IN AERS: 2.0000 | INHALATION_SPRAY | RESPIRATORY_TRACT | 6 refills | Status: DC | PRN

## 2021-02-13 MED ORDER — TIZANIDINE HCL 4 MG PO TABS: 4.0000 mg | ORAL_TABLET | Freq: Every day | ORAL | 0 refills | Status: DC

## 2021-02-13 MED ORDER — ASPIRIN 81 MG PO TBEC: 81.0000 mg | DELAYED_RELEASE_TABLET | Freq: Every day | ORAL | 11 refills | Status: DC

## 2021-02-13 MED ORDER — AMLODIPINE BESYLATE 5 MG PO TABS
10.0000 mg | ORAL_TABLET | Freq: Once | ORAL | Status: AC
  Administered 2021-02-13: 10 mg via ORAL
  Filled 2021-02-13: qty 2

## 2021-02-13 MED ORDER — CEPHALEXIN 500 MG PO CAPS: 500.0000 mg | ORAL_CAPSULE | Freq: Three times a day (TID) | ORAL | 0 refills | Status: AC

## 2021-02-13 MED ORDER — DOXYCYCLINE HYCLATE 100 MG PO TABS: 100.0000 mg | ORAL_TABLET | Freq: Two times a day (BID) | ORAL | 0 refills | Status: AC

## 2021-02-13 MED ORDER — AMLODIPINE BESYLATE 10 MG PO TABS: 10.0000 mg | ORAL_TABLET | Freq: Every day | ORAL | 11 refills | Status: DC

## 2021-02-13 MED ORDER — MUPIROCIN 2 % EX OINT: TOPICAL_OINTMENT | Freq: Two times a day (BID) | CUTANEOUS | 0 refills | Status: DC

## 2021-02-13 MED ORDER — NICOTINE 14 MG/24HR TD PT24: 14.0000 mg | MEDICATED_PATCH | TRANSDERMAL | 0 refills | Status: DC

## 2021-02-13 MED ORDER — IPRATROPIUM BROMIDE 0.02 % IN SOLN: 0.5000 mg | Freq: Four times a day (QID) | RESPIRATORY_TRACT | 1 refills | Status: DC | PRN

## 2021-02-13 MED ORDER — POLYETHYLENE GLYCOL 3350 17 G PO PACK: 17.0000 g | PACK | Freq: Every day | ORAL | 0 refills | Status: DC | PRN

## 2021-02-13 NOTE — Discharge Instructions (Signed)
1)Avoid ibuprofen/Advil/Aleve/Motrin/Goody Powders/Naproxen/BC powders/Meloxicam/Diclofenac/Indomethacin and other Nonsteroidal anti-inflammatory medications as these will make you more likely to bleed and can cause stomach ulcers, can also cause Kidney problems.   2)Please take Doxycycline and Cephalexin antibiotics as prescribed for infection  3)Please follow-up with Dr. Sharol Given your orthopedic surgeon on 02/19/2021 as previously scheduled  4)Smoking cessation strongly advised  5) Rt foot sole wound---Apply iodine/betadine once or twice daily to right foot plantar eschar.  Allow to air dry. Place a size appropriate foam dressing over it.--- please have Dr. Sharol Given take a look at this wound when you see him on 02/19/2021

## 2021-02-13 NOTE — TOC Transition Note (Signed)
Transition of Care [facility]) - CM/SW Discharge Note   Patient Details  Name: TOBBY FAWCETT MRN: [id number] Date of Birth: 11/08/1960  [facility] CM/SW Contact:  Natasha Bence, LCSW Phone Number: 02/13/2021, 2:21 PM   Clinical Narrative:    CSW notified of patient's readiness for discharge. Linda with [facility] agreeable to resume [facility] services. TOC signing off.   Final next level of care: [place] Barriers to Discharge: Barriers Resolved   Patient Goals and CMS Choice Patient states their goals for this hospitalization and ongoing recovery are:: return home with [facility] CMS Medicare.gov Compare Post Acute Care list provided to:: Patient Choice offered to / list presented to : Patient  Discharge Placement                    Patient and family notified of of transfer: 02/13/21  Discharge Plan and Services                                     Social Determinants of Health (SDOH) Interventions     Readmission Risk Interventions No flowsheet data found.

## 2021-02-13 NOTE — Discharge Summary (Signed)
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 leg. Femoral margin of resection appears intact. No evidence of bone erosion or sclerosis to suggest osteomyelitis. Soft tissues appear intact. Vascular calcifications. No focal bone lesion or bone destruction. IMPRESSION: Postoperative left above the knee amputation. Margin of resection appears intact. No radiographic evidence of osteomyelitis. Electronically Signed   By: Lucienne Capers M.D.   On: 02/11/2021 20:49    Micro Results  Recent Results (from the past 240 hour(s))  Wound culture     Status: Abnormal   Collection Time: 02/05/21  3:02 PM   Specimen: Wound  Result Value Ref Range Status   MICRO NUMBER: [id number]  Final   SPECIMEN QUALITY: Adequate  Final   SOURCE: WOUND (SITE NOT SPECIFIED)  Final   STATUS: FINAL  Final   GRAM STAIN:   Final    Many White blood cells seen  No epithelial cells seen Moderate Gram positive cocci in clusters   ISOLATE 1: methicillin resistant Staphylococcus aureus (A)  Final    Comment: Heavy growth of Methicillin resistant Staphylococcus aureus (MRSA)      Susceptibility   Methicillin resistant staphylococcus aureus - AEROBIC CULT, GRAM STAIN POSITIVE 1    VANCOMYCIN <=0.5 Sensitive     CIPROFLOXACIN >=8 Resistant     CLINDAMYCIN >=8  Resistant     LEVOFLOXACIN 4 Resistant     ERYTHROMYCIN >=8 Resistant     GENTAMICIN <=0.5 Sensitive     OXACILLIN* NR Resistant      * Oxacillin-resistant staphylococci are resistant to all currently available beta-lactam antimicrobial agents with the possible exception of Ceftaroline.     TETRACYCLINE <=1 Sensitive     TRIMETH/SULFA* <=10 Sensitive      * Oxacillin-resistant staphylococci are resistant to all currently available beta-lactam antimicrobial agents with the possible exception of Ceftaroline. Legend: S = Susceptible  I = Intermediate R = Resistant  NS = Not susceptible * = Not tested  NR = Not reported **NN = See antimicrobic comments   Culture, blood (routine x 2)     Status: None (Preliminary result)   Collection Time: 02/11/21  4:22 PM   Specimen: Right Antecubital; Blood  Result Value Ref Range Status   Specimen Description RIGHT ANTECUBITAL  Final   Special Requests   Final    BOTTLES DRAWN AEROBIC AND ANAEROBIC Blood Culture adequate volume   Culture   Final    NO GROWTH 2 DAYS Performed at [facility], [address], [place], [place] [address]    Report Status PENDING  Incomplete  Resp Panel by RT-PCR (Flu A&B, Covid) Nasopharyngeal Swab     Status: None   Collection Time: 02/11/21  4:51 PM   Specimen: Nasopharyngeal Swab; Nasopharyngeal(NP) swabs in vial transport medium  Result Value Ref Range Status   SARS Coronavirus 2 by RT PCR NEGATIVE NEGATIVE Final    Comment: (NOTE) SARS-CoV-2 target nucleic acids are NOT DETECTED.  The SARS-CoV-2 RNA is generally  detectable in upper respiratory specimens during the acute phase of infection. The lowest concentration of SARS-CoV-2 viral copies this assay can detect is 138 copies/mL. A negative result does not preclude SARS-Cov-2 infection and should not be used as the sole basis for treatment or other patient management decisions. A negative result may occur with  improper specimen collection/handling, submission of specimen other than nasopharyngeal swab, presence of viral mutation(s) within the areas targeted by this assay, and inadequate number of viral copies(<138 copies/mL). A negative result must be combined with clinical observations, patient history, and epidemiological information. The expected result is Negative.  Fact Sheet for Patients:  EntrepreneurPulse.com.au  Fact Sheet for Healthcare Providers:  IncredibleEmployment.be  This test is no t yet approved or cleared by the [place] FDA and  has been authorized for detection and/or diagnosis of SARS-CoV-2 by FDA under an Emergency Use Authorization (EUA). This EUA will remain  in effect (meaning this test can be used) for the duration of the COVID-19 declaration under Section 564(b)(1) of the Act, 21 U.S.C.section 360bbb-3(b)(1), unless the authorization is terminated  or revoked sooner.       Influenza A by PCR NEGATIVE NEGATIVE Final   Influenza B by PCR NEGATIVE NEGATIVE Final    Comment: (NOTE) The Xpert Xpress SARS-CoV-2/FLU/RSV plus assay is intended as an aid in the diagnosis of influenza from Nasopharyngeal swab specimens and should not be used as a sole basis for treatment. Nasal washings and aspirates are unacceptable for Xpert Xpress SARS-CoV-2/FLU/RSV testing.  Fact Sheet for Patients: EntrepreneurPulse.com.au  Fact Sheet for Healthcare Providers: IncredibleEmployment.be  This test is not yet approved or cleared by the [place] FDA  and has been authorized for detection and/or diagnosis of SARS-CoV-2 by FDA under an Emergency Use Authorization (EUA). This EUA will remain in effect (meaning this test can be  used) for the duration of the COVID-19 declaration under Section 564(b)(1) of the Act, 21 U.S.C. section 360bbb-3(b)(1), unless the authorization is terminated or revoked.  Performed at [facility], [address], [place], [place] [address]   Culture, blood (routine x 2)     Status: None (Preliminary result)   Collection Time: 02/11/21  5:17 PM   Specimen: BLOOD LEFT HAND  Result Value Ref Range Status   Specimen Description BLOOD LEFT HAND  Final   Special Requests   Final    BOTTLES DRAWN AEROBIC AND ANAEROBIC Blood Culture adequate volume   Culture   Final    NO GROWTH 2 DAYS Performed at [facility], [address], [place], [place] [address]    Report Status PENDING  Incomplete  Urine Culture     Status: None   Collection Time: 02/11/21  5:25 PM   Specimen: Urine, Clean Catch  Result Value Ref Range Status   Specimen Description   Final    URINE, CLEAN CATCH Performed at [facility], [address] [place], [place] [address]    Special Requests   Final    NONE Performed at [facility], [address], [place], [place] [address]    Culture   Final    NO GROWTH Performed at [facility], [place] [address], [place], [place] [address]    Report Status 02/13/2021 FINAL  Final  MRSA Next Gen by PCR, Nasal     Status: Abnormal   Collection Time: 02/12/21 12:38 AM   Specimen: Nasal Mucosa; Nasal Swab  Result Value Ref Range Status   MRSA by PCR Next Gen DETECTED (A) NOT DETECTED Final    Comment: RESULT CALLED TO, READ BACK BY AND VERIFIED WITH: SMITH J. AT [id number] ON [id number] BY THOMPSON S. (NOTE) The GeneXpert MRSA Assay (FDA approved for NASAL specimens only), is one component of a comprehensive MRSA colonization surveillance program. It is not intended to diagnose MRSA infection nor to  guide or monitor treatment for MRSA infections. Test performance is not FDA approved in patients less than 36 years old. Performed at [facility], [address] [place], [place] [address]     Today   Subjective    Johnathen Testa today has no new complaints  -No fever  Or chills  -No chest pains or palpitations no dizziness no shortness of breath -Wife at bedside, questions answered          Patient has been seen and examined prior to discharge   Objective   Blood pressure (!) 147/83, pulse 83, temperature 98.1 F (36.7 C), temperature source Oral, resp. rate (!) 24, height 6' (1.829 m), weight 81 kg, SpO2 97 %.   Intake/Output Summary (Last 24 hours) at 02/13/2021 1921 Last data filed at 02/13/2021 1319 Gross per 24 hour  Intake 1399.44 ml  Output 950 ml  Net 449.44 ml    Exam Gen:- Awake Alert, in no acute distress HEENT:- [place].AT, No sclera icterus Neck-Supple Neck,No JVD,.  Lungs-  CTAB , fair symmetrical air movement CV- S1, S2 normal, regular  Abd-  +ve B.Sounds, Abd Soft, No tenderness,    Extremity/Skin:-See photos in epic  psych-affect is appropriate, oriented x3 Neuro-generalized weakness, no new focal deficits, no tremors -Media Information Document Information  Photos  Rt sole  02/12/2021 09:04  Attached To:  Hospital Encounter on 02/11/21   Source Information  Roxan Hockey, MD  [facility]   Media Information Document Information  Photos  Lt  AKA stump  02/12/2021 09:05  Attached To:  Hospital Encounter on 02/11/21   Source Information  Roxan Hockey, MD  [facility]   Media Information Document Information  Photos  Lt Neck Abscess --s/p I and D   02/12/2021 09:08  Attached To:  Hospital Encounter on 02/11/21   Source Information  Roxan Hockey, MD  [facility]      Data Review   CBC w Diff:  Lab Results  Component Value Date   WBC 9.7 02/13/2021   HGB 12.6 (L) 02/13/2021   HGB 15.5 03/24/2017    HCT 39.0 02/13/2021   HCT 46.1 03/24/2017   PLT 166 02/13/2021   PLT 166 03/24/2017   LYMPHOPCT 17 02/11/2021   MONOPCT 8 02/11/2021   EOSPCT 1 02/11/2021   BASOPCT 1 02/11/2021    CMP:  Lab Results  Component Value Date   NA 137 02/13/2021   NA 137 03/24/2017   K 3.9 02/13/2021   CL 108 02/13/2021   CO2 24 02/13/2021   BUN 16 02/13/2021   BUN 12 03/24/2017   CREATININE 0.55 (L) 02/13/2021   CREATININE 0.85 01/15/2016   PROT 6.4 (L) 02/11/2021   ALBUMIN 3.3 (L) 02/11/2021   BILITOT 0.5 02/11/2021   ALKPHOS 84 02/11/2021   AST 20 02/11/2021   ALT 15 02/11/2021  .   Total Discharge time is about 33 minutes  Roxan Hockey M.D on 02/13/2021 at 7:21 PM  Go to [email or web address] -  for contact info  [facility] - Office  [phone]

## 2021-02-13 NOTE — Final Consult Note (Signed)
WOC Nurse Consult Note: Patient receiving care in [place]. Reason for Consult: "wound".  After discussing the consult with the primary RN, he agrees the request is for the right foot plantar wound.  Photos are in the record. Wound type: dry, black, stable eschar. No evidence of osteomyelitis on xray. Patient has had former amputation of toes 1 & 2 of the right foot. Pressure Injury POA: Yes/No/NA Measurement: Wound bed: black eschar Drainage (amount, consistency, odor) none Periwound: heavy callus Dressing procedure/placement/frequency: Daily application of iodine and size appropriate foam dressing.  He needs to have an orthopedic specialist, or podiatrist, look at this wound and help manage it upon discharge.  Monitor the wound area(s) for worsening of condition such as: Signs/symptoms of infection,  Increase in size,  Development of or worsening of odor, Development of pain, or increased pain at the affected locations.  Notify the medical team if any of these develop.  Thank you for the consult.  Discussed plan of care with the bedside nurse.  [place] nurse will not follow at this time.  Please re-consult the [place] team if needed.  Val Riles, RN, MSN, CWOCN, CNS-BC, pager [phone]

## 2021-02-16 ENCOUNTER — Telehealth: Payer: Self-pay | Admitting: Orthopedic Surgery

## 2021-02-16 LAB — CULTURE, BLOOD (ROUTINE X 2)
Culture: NO GROWTH
Culture: NO GROWTH
Special Requests: ADEQUATE
Special Requests: ADEQUATE

## 2021-02-16 NOTE — Telephone Encounter (Signed)
Troy [name] with [facility] calling to update that pt is being accessed for P.T tomorrow 02/17/21. The best call back number for Troy [name] is [phone].

## 2021-02-17 ENCOUNTER — Encounter: Payer: Self-pay | Admitting: Orthopedic Surgery

## 2021-02-17 NOTE — Progress Notes (Signed)
Office Visit Note   Patient: Troy [name]           Date of Birth: Jan 17, 1961           MRN: [id number] Visit Date: 02/05/2021              Requested by: [name], MD [id number] [place],  [place] [address] PCP: Pcp, No  Chief Complaint  Patient presents with   Left Leg - Routine Post Op    01/21/21 left AKA       HPI: Patient is a 60 year old gentleman who presents for 2 separate issues.  #1 he is 2 weeks status post left above-the-knee amputation.  #2 he presents with an acute left sided neck abscess.  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  Assessment & Plan: Visit Diagnoses:  1. Abscess, neck   2. S/P AKA (above knee amputation), left (HCC)     Plan: Abscess was drained and packed open.  He is given a prescription for doxycycline he will start Dial soap cleansing tomorrow purulence sent for cultures.  Follow-Up Instructions: Return in about 2 weeks (around 02/19/2021).   Ortho Exam  Patient is alert, oriented, no adenopathy, well-dressed, normal affect, normal respiratory effort. Examination patient has a well-healing left above-the-knee amputation he will continue the shrinker and therapy.  Examination of the left side of his neck he has an abscess boil that is approximately 3 cm in diameter.  After informed consent and sterile prepping patient underwent regional block with 1 cc of 1% lidocaine plain.  A 10 blade knife was then used to open the abscess the purulent fluid was sent for cultures the abscess was decompressed cleansed and packed open with iodoform gauze.  Sterile dressing was applied.  Patient tolerated this well.  Imaging: No results found. No images are attached to the encounter.  Labs: Lab Results  Component Value Date   HGBA1C 12.3 (H) 01/21/2021   HGBA1C 11.0 (H) 12/13/2017   REPTSTATUS 02/13/2021 FINAL 02/11/2021   GRAMSTAIN  08/22/2020    FEW WBC PRESENT,BOTH PMN AND  MONONUCLEAR ABUNDANT GRAM POSITIVE COCCI IN PAIRS    CULT  02/11/2021    NO GROWTH Performed at [facility], [place] [address] [place],  [address]    [place] OXYTOCA 08/22/2020     Lab Results  Component Value Date   ALBUMIN 3.3 (L) 02/11/2021   ALBUMIN 3.5 08/22/2020   ALBUMIN 3.5 07/25/2020    Lab Results  Component Value Date   MG 1.4 (L) 10/22/2018   MG 1.9 08/14/2008   MG 2.2 08/11/2008   No results found for: VD25OH  No results found for: PREALBUMIN CBC EXTENDED Latest Ref Rng & Units 02/13/2021 02/12/2021 02/11/2021  WBC 4.0 - 10.5 K/uL 9.7 14.2(H) 17.6(H)  RBC 4.22 - 5.81 MIL/uL 4.48 4.25 4.79  HGB 13.0 - 17.0 g/dL 12.6(L) 12.1(L) 14.1  HCT 39.0 - 52.0 % 39.0 37.0(L) 42.5  PLT 150 - 400 K/uL 166 183 267  NEUTROABS 1.7 - 7.7 K/uL - - 13.1(H)  LYMPHSABS 0.7 - 4.0 K/uL - - 2.9     There is no height or weight on file to calculate BMI.  Orders:  Orders Placed This Encounter  Procedures   Wound culture   Meds ordered this encounter  Medications   DISCONTD: doxycycline (VIBRA-TABS) 100 MG tablet    Sig: Take 1 tablet (100  mg total) by mouth 2 (two) times daily.    Dispense:  60 tablet    Refill:  0     Procedures: No procedures performed  Clinical Data: No additional findings.  ROS:  All other systems negative, except as noted in the HPI. Review of Systems  Objective: Vital Signs: There were no vitals taken for this visit.  Specialty Comments:  No specialty comments available.  PMFS History: Patient Active Problem List   Diagnosis Date Noted   S/P AKA (above knee amputation), left ([place]) 02/12/2021   Amputation of right great toe and 2nd Toe 02/12/2021   Sepsis due to left-sided neck cellulitis/MRSA 02/12/2021   Hypotensive episode    Elevated troponin    SIRS (systemic inflammatory response syndrome) ([place]) 02/11/2021   Abscess of bursa of left ankle 01/21/2021   Dehiscence of amputation stump (HCC)    Ischemia of  left BKA site ([place])    Abscess of leg without foot, left    Osteomyelitis of second toe of right foot ([place]) 07/12/2019   Below knee amputation status, left 01/25/2018   Wound dehiscence, surgical    Status post left foot surgery 12/15/2017   GERD (gastroesophageal reflux disease) 09/26/2017   Hyperlipidemia associated with type 2 diabetes mellitus ([place]) 07/08/2017   Snoring 06/07/2016   Chest pain 01/22/2016   Abnormal nuclear stress test 01/22/2016   Pain, chronic due to trauma 07/04/2012   Complex regional pain syndrome I of lower limb 07/04/2012   COPD (chronic obstructive pulmonary disease) ([place]) 01/27/2011   Pre-operative cardiovascular examination 01/27/2011   Nonspecific abnormal electrocardiogram (ECG) (EKG) 01/27/2011   Murmur 01/27/2011   DM2 (diabetes mellitus, type 2) ([place])    HTN (hypertension)    HLD (hyperlipidemia)    Tobacco abuse    Past Medical History:  Diagnosis Date   Acute renal failure ([place])    in setting of NSAID use and orthopedic surgery 2010   Anxiety and depression    Chronic diastolic CHF (congestive heart failure) ([place])    a. Echo 6/17: severe conc LVH, vigorous EF, EF 65-70%, no dynamic obstruction, no RWMA, Gr 1 DD, mild TR  //  b. LHC 8/17: no sig CAD, LVEDP 28   COPD (chronic obstructive pulmonary disease) (HCC)    Diabetic ulcer of left foot (HCC)    DM2 (diabetes mellitus, type 2) ([place])    Dysrhythmia    Family history of early CAD    Fatty liver    GERD (gastroesophageal reflux disease)    History of amputation of foot (HCC)    L trans-met // R toe   History of cardiac catheterization    a. [place] 2002: irregs  //  b. LHC in 8/17: no sig CAD, apical DK, hyperdynamic LV, LVEDP 28   History of kidney stones    History of nuclear stress test    a. Nuc 7/17: Overall, intermediate risk nuclear stress test secondary to small size of apical lateral defect and reduced ejection fraction.  EF 43%   HLD (hyperlipidemia)    HTN (hypertension)    Hx  of BKA, left ([place]) 01/03/2018   Injuries     crushing injury to both his feet in February 2010.    Kidney calculi    Palpitations    PTSD (post-traumatic stress disorder)    Tobacco abuse     Family History  Problem Relation Age of Onset   Leukemia Mother 79       died   Lung  cancer Father 23       died   Heart attack Brother 91   Heart attack Brother 6   Hypertension Brother        X3   Hypertension Sister        X2   Diabetes Sister    Stroke Sister    Diabetes Sister    Other Brother        Musician accident    Past Surgical History:  Procedure Laterality Date   AMPUTATION Left 01/03/2018   Procedure: LEFT MIDFOOT AMPUTATION/REVISION MIDAMPUTAION;  Surgeon: Mcarthur Rossetti, MD;  Location: [facility];  Service: Orthopedics;  Laterality: Left;   AMPUTATION Left 01/25/2018   Procedure: LEFT BELOW KNEE AMPUTATION;  Surgeon: [name], MD;  Location: [facility];  Service: Orthopedics;  Laterality: Left;   AMPUTATION Left 01/21/2021   Procedure: LEFT ABOVE KNEE AMPUTATION;  Surgeon: [name], MD;  Location: [facility];  Service: Orthopedics;  Laterality: Left;   AMPUTATION TOE Right 07/17/2019   Procedure: AMPUTATION RIGHT FOOT 2ND TOE;  Surgeon: Mcarthur Rossetti, MD;  Location: [facility];  Service: Orthopedics;  Laterality: Right;   APPLICATION OF WOUND VAC Left 01/21/2021   Procedure: APPLICATION OF WOUND VAC;  Surgeon: [name], MD;  Location: [facility];  Service: Orthopedics;  Laterality: Left;   BELOW KNEE LEG AMPUTATION Left 01/25/2018   CARDIAC CATHETERIZATION N/A 01/22/2016   Procedure: Left Heart Cath and Coronary Angiography;  Surgeon: Peter M Martinique, MD;  Location: [facility];  Service: Cardiovascular;  Laterality: N/A;   FOOT AMPUTATION Bilateral    I & D EXTREMITY Left 12/15/2017   Procedure: IRRIGATION AND DEBRIDEMENT LEFT FOOT ULCER;  Surgeon: Mcarthur Rossetti, MD;  Location: [facility];  Service: Orthopedics;  Laterality: Left;   I & D  EXTREMITY Left 07/25/2020   Procedure: LEFT BELOW KNEE AMPUTATION ABSCESS EXCISION AND SKIN GRAFT;  Surgeon: [name], MD;  Location: [facility];  Service: Orthopedics;  Laterality: Left;   I & D EXTREMITY Left 08/22/2020   Procedure: DEBRIDEMENT LEFT BELOW KNEE AMPUTATION AND APPLY KERECIS SKIN GRAFT;  Surgeon: [name], MD;  Location: [facility];  Service: Orthopedics;  Laterality: Left;   LITHOTRIPSY     TENDON LENGTHENING Bilateral    [place]   TONSILLECTOMY     Social History   Occupational History   Occupation: DISABLED  Tobacco Use   Smoking status: Every Day    Packs/day: 0.75    Years: 40.00    Pack years: 30.00    Types: Cigarettes   Smokeless tobacco: Never  Vaping Use   Vaping Use: Never used  Substance and Sexual Activity   Alcohol use: No   Drug use: No   Sexual activity: Not on file

## 2021-02-19 ENCOUNTER — Ambulatory Visit (INDEPENDENT_AMBULATORY_CARE_PROVIDER_SITE_OTHER): Payer: Medicare HMO | Admitting: Physician Assistant

## 2021-02-19 ENCOUNTER — Other Ambulatory Visit: Payer: Self-pay

## 2021-02-19 ENCOUNTER — Encounter: Payer: Self-pay | Admitting: Orthopedic Surgery

## 2021-02-19 DIAGNOSIS — Z89612 Acquired absence of left leg above knee: Secondary | ICD-10-CM

## 2021-02-19 NOTE — Progress Notes (Signed)
Office Visit Note   Patient: Troy [name]           Date of Birth: [birth year]           MRN: [id number] Visit Date: 02/19/2021              Requested by: [name], MD [address] [place],  [place] [id number] PCP: Pcp, No  Chief Complaint  Patient presents with   Left Knee - Routine Post Op      HPI: Patient presents today 1 month status post left above-knee amputation.  With regards to that he is doing well.  He was recently hospitalized because of a abscess on his neck.  He is had antibiotics this was MRSA.  He is doing much better  Assessment & Plan: Visit Diagnoses: No diagnosis found.  Plan: Prescription was provided for left above-knee amputation prosthetic patient will follow-up in 2 weeks. Patient is a new left transfemoral amputee.  Patient's current comorbidities are not expected to impact the ability to function with the prescribed prosthesis. Patient verbally communicates a strong desire to use a prosthesis. Patient currently requires mobility aids to ambulate without a prosthesis.  Expects not to use mobility aids with a new prosthesis.  Patient is a K3 level ambulator that spends a lot of time walking around on uneven terrain over obstacles, up and down stairs, and ambulates with a variable cadence.    Follow-Up Instructions: No follow-ups on file.   Ortho Exam  Patient is alert, oriented, no adenopathy, well-dressed, normal affect, normal respiratory effort. Examination demonstrates well apposed wound edges.  Wound is well-healed surgical sutures and staples in place.  Swelling is well controlled.  No ascending cellulitis or signs of infection  Imaging: No results found. No images are attached to the encounter.  Labs: Lab Results  Component Value Date   HGBA1C 12.3 (H) 01/21/2021   HGBA1C 11.0 (H) 12/13/2017   REPTSTATUS 02/13/2021 FINAL 02/11/2021   GRAMSTAIN  08/22/2020    FEW WBC PRESENT,BOTH PMN AND MONONUCLEAR ABUNDANT GRAM POSITIVE  COCCI IN PAIRS    CULT  02/11/2021    NO GROWTH Performed at [facility], [place] [address], [place], [place] [address]    [place] OXYTOCA 08/22/2020     Lab Results  Component Value Date   ALBUMIN 3.3 (L) 02/11/2021   ALBUMIN 3.5 08/22/2020   ALBUMIN 3.5 07/25/2020    Lab Results  Component Value Date   MG 1.4 (L) 10/22/2018   MG 1.9 08/14/2008   MG 2.2 08/11/2008   No results found for: VD25OH  No results found for: PREALBUMIN CBC EXTENDED Latest Ref Rng & Units 02/13/2021 02/12/2021 02/11/2021  WBC 4.0 - 10.5 K/uL 9.7 14.2(H) 17.6(H)  RBC 4.22 - 5.81 MIL/uL 4.48 4.25 4.79  HGB 13.0 - 17.0 g/dL 12.6(L) 12.1(L) 14.1  HCT 39.0 - 52.0 % 39.0 37.0(L) 42.5  PLT 150 - 400 K/uL 166 183 267  NEUTROABS 1.7 - 7.7 K/uL - - 13.1(H)  LYMPHSABS 0.7 - 4.0 K/uL - - 2.9     There is no height or weight on file to calculate BMI.  Orders:  No orders of the defined types were placed in this encounter.  No orders of the defined types were placed in this encounter.    Procedures: No procedures performed  Clinical Data: No additional findings.  ROS:  All other systems negative, except as noted in the HPI. Review of Systems  Objective: Vital Signs: There  were no vitals taken for this visit.  Specialty Comments:  No specialty comments available.  PMFS History: Patient Active Problem List   Diagnosis Date Noted   S/P AKA (above knee amputation), left ([place]) 02/12/2021   Amputation of right great toe and 2nd Toe 02/12/2021   Sepsis due to left-sided neck cellulitis/MRSA 02/12/2021   Hypotensive episode    Elevated troponin    SIRS (systemic inflammatory response syndrome) ([place]) 02/11/2021   Abscess of bursa of left ankle 01/21/2021   Dehiscence of amputation stump (HCC)    Ischemia of left BKA site ([place])    Abscess of leg without foot, left    Osteomyelitis of second toe of right foot ([place]) 07/12/2019   Below knee amputation status, left 01/25/2018    Wound dehiscence, surgical    Status post left foot surgery 12/15/2017   GERD (gastroesophageal reflux disease) 09/26/2017   Hyperlipidemia associated with type 2 diabetes mellitus ([place]) 07/08/2017   Snoring 06/07/2016   Chest pain 01/22/2016   Abnormal nuclear stress test 01/22/2016   Pain, chronic due to trauma 07/04/2012   Complex regional pain syndrome I of lower limb 07/04/2012   COPD (chronic obstructive pulmonary disease) ([place]) 01/27/2011   Pre-operative cardiovascular examination 01/27/2011   Nonspecific abnormal electrocardiogram (ECG) (EKG) 01/27/2011   Murmur 01/27/2011   DM2 (diabetes mellitus, type 2) ([place])    HTN (hypertension)    HLD (hyperlipidemia)    Tobacco abuse    Past Medical History:  Diagnosis Date   Acute renal failure ([place])    in setting of NSAID use and orthopedic surgery 2010   Anxiety and depression    Chronic diastolic CHF (congestive heart failure) ([place])    a. Echo 6/17: severe conc LVH, vigorous EF, EF 65-70%, no dynamic obstruction, no RWMA, Gr 1 DD, mild TR  //  b. LHC 8/17: no sig CAD, LVEDP 28   COPD (chronic obstructive pulmonary disease) (HCC)    Diabetic ulcer of left foot (HCC)    DM2 (diabetes mellitus, type 2) ([place])    Dysrhythmia    Family history of early CAD    Fatty liver    GERD (gastroesophageal reflux disease)    History of amputation of foot (HCC)    L trans-met // R toe   History of cardiac catheterization    a. [place] 2002: irregs  //  b. LHC in 8/17: no sig CAD, apical DK, hyperdynamic LV, LVEDP 28   History of kidney stones    History of nuclear stress test    a. Nuc 7/17: Overall, intermediate risk nuclear stress test secondary to small size of apical lateral defect and reduced ejection fraction.  EF 43%   HLD (hyperlipidemia)    HTN (hypertension)    Hx of BKA, left ([place]) 01/03/2018   Injuries     crushing injury to both his feet in February 2010.    Kidney calculi    Palpitations    PTSD (post-traumatic stress  disorder)    Tobacco abuse     Family History  Problem Relation Age of Onset   Leukemia Mother 77       died   Lung cancer Father 75       died   Heart attack Brother 1   Heart attack Brother 49   Hypertension Brother        X3   Hypertension Sister        X2   Diabetes Sister    Stroke Sister  Diabetes Sister    Other Proofreader accident    Past Surgical History:  Procedure Laterality Date   AMPUTATION Left 01/03/2018   Procedure: LEFT MIDFOOT AMPUTATION/REVISION MIDAMPUTAION;  Surgeon: Mcarthur Rossetti, MD;  Location: [facility];  Service: Orthopedics;  Laterality: Left;   AMPUTATION Left 01/25/2018   Procedure: LEFT BELOW KNEE AMPUTATION;  Surgeon: [name], MD;  Location: [facility];  Service: Orthopedics;  Laterality: Left;   AMPUTATION Left 01/21/2021   Procedure: LEFT ABOVE KNEE AMPUTATION;  Surgeon: [name], MD;  Location: [facility];  Service: Orthopedics;  Laterality: Left;   AMPUTATION TOE Right 07/17/2019   Procedure: AMPUTATION RIGHT FOOT 2ND TOE;  Surgeon: Mcarthur Rossetti, MD;  Location: [facility];  Service: Orthopedics;  Laterality: Right;   APPLICATION OF WOUND VAC Left 01/21/2021   Procedure: APPLICATION OF WOUND VAC;  Surgeon: [name], MD;  Location: [facility];  Service: Orthopedics;  Laterality: Left;   BELOW KNEE LEG AMPUTATION Left 01/25/2018   CARDIAC CATHETERIZATION N/A 01/22/2016   Procedure: Left Heart Cath and Coronary Angiography;  Surgeon: Peter M Martinique, MD;  Location: [facility];  Service: Cardiovascular;  Laterality: N/A;   FOOT AMPUTATION Bilateral    I & D EXTREMITY Left 12/15/2017   Procedure: IRRIGATION AND DEBRIDEMENT LEFT FOOT ULCER;  Surgeon: Mcarthur Rossetti, MD;  Location: [facility];  Service: Orthopedics;  Laterality: Left;   I & D EXTREMITY Left 07/25/2020   Procedure: LEFT BELOW KNEE AMPUTATION ABSCESS EXCISION AND SKIN GRAFT;  Surgeon: [name], MD;  Location: [facility];  Service:  Orthopedics;  Laterality: Left;   I & D EXTREMITY Left 08/22/2020   Procedure: DEBRIDEMENT LEFT BELOW KNEE AMPUTATION AND APPLY KERECIS SKIN GRAFT;  Surgeon: [name], MD;  Location: [facility];  Service: Orthopedics;  Laterality: Left;   LITHOTRIPSY     TENDON LENGTHENING Bilateral    calf   TONSILLECTOMY     Social History   Occupational History   Occupation: DISABLED  Tobacco Use   Smoking status: Every Day    Packs/day: 0.75    Years: 40.00    Pack years: 30.00    Types: Cigarettes   Smokeless tobacco: Never  Vaping Use   Vaping Use: Never used  Substance and Sexual Activity   Alcohol use: No   Drug use: No   Sexual activity: Not on file

## 2021-03-04 ENCOUNTER — Other Ambulatory Visit: Payer: Self-pay

## 2021-03-04 ENCOUNTER — Ambulatory Visit (INDEPENDENT_AMBULATORY_CARE_PROVIDER_SITE_OTHER): Payer: Medicare HMO | Admitting: Physician Assistant

## 2021-03-04 ENCOUNTER — Encounter: Payer: Self-pay | Admitting: Physician Assistant

## 2021-03-04 DIAGNOSIS — Z89612 Acquired absence of left leg above knee: Secondary | ICD-10-CM

## 2021-03-04 NOTE — Progress Notes (Signed)
Office Visit Note   Patient: Troy [name]           Date of Birth: 30-Apr-1961           MRN: [id number] Visit Date: 03/04/2021              Requested by: No referring provider defined for this encounter. PCP: Pcp, No  Chief Complaint  Patient presents with   6 weeks post left AKA      HPI: Patient is status post left above-knee amputation 6 weeks.  He is doing very well.  He has been meeting with [employer] and expects to be molded for his prosthetic in a week or so.  His wife also is asked that I debrided callus on the undersurface of his first metatarsal  Assessment & Plan: Visit Diagnoses:  1. S/P AKA (above knee amputation), left ([place])     Plan: Patient will follow-up in 4 weeks sooner if any concerns I have placed a referral to physical therapy  Follow-Up Instructions: No follow-ups on file.   Ortho Exam  Patient is alert, oriented, no adenopathy, well-dressed, normal affect, normal respiratory effort. Examination demonstrates on the left well-healed amputation incision.  No drainage no dehiscence no cellulitis incision is completely healed.  On the right foot he is status post great toe amputation he has a thickened callus beneath the fifth first metatarsal head there is no drainage no surrounding erythema no swelling no ascending cellulitis.  After verbal consent the callus was trimmed to a soft surface.  He does have orthotics with pressure relief in this area No results found. No images are attached to the encounter.  Labs: Lab Results  Component Value Date   HGBA1C 12.3 (H) 01/21/2021   HGBA1C 11.0 (H) 12/13/2017   REPTSTATUS 02/13/2021 FINAL 02/11/2021   GRAMSTAIN  08/22/2020    FEW WBC PRESENT,BOTH PMN AND MONONUCLEAR ABUNDANT GRAM POSITIVE COCCI IN PAIRS    CULT  02/11/2021    NO GROWTH Performed at [facility], [place] [address], [place], [place] [address]    [place] OXYTOCA 08/22/2020     Lab Results  Component Value Date   ALBUMIN  3.3 (L) 02/11/2021   ALBUMIN 3.5 08/22/2020   ALBUMIN 3.5 07/25/2020    Lab Results  Component Value Date   MG 1.4 (L) 10/22/2018   MG 1.9 08/14/2008   MG 2.2 08/11/2008   No results found for: VD25OH  No results found for: PREALBUMIN CBC EXTENDED Latest Ref Rng & Units 02/13/2021 02/12/2021 02/11/2021  WBC 4.0 - 10.5 K/uL 9.7 14.2(H) 17.6(H)  RBC 4.22 - 5.81 MIL/uL 4.48 4.25 4.79  HGB 13.0 - 17.0 g/dL 12.6(L) 12.1(L) 14.1  HCT 39.0 - 52.0 % 39.0 37.0(L) 42.5  PLT 150 - 400 K/uL 166 183 267  NEUTROABS 1.7 - 7.7 K/uL - - 13.1(H)  LYMPHSABS 0.7 - 4.0 K/uL - - 2.9     There is no height or weight on file to calculate BMI.  Orders:  Orders Placed This Encounter  Procedures   Ambulatory referral to Physical Therapy   No orders of the defined types were placed in this encounter.    Procedures: No procedures performed  Clinical Data: No additional findings.  ROS:  All other systems negative, except as noted in the HPI. Review of Systems  Objective: Vital Signs: There were no vitals taken for this visit.  Specialty Comments:  No specialty comments available.  PMFS History: Patient Active Problem List  Diagnosis Date Noted   S/P AKA (above knee amputation), left ([place]) 02/12/2021   Amputation of right great toe and 2nd Toe 02/12/2021   Sepsis due to left-sided neck cellulitis/MRSA 02/12/2021   Hypotensive episode    Elevated troponin    SIRS (systemic inflammatory response syndrome) ([place]) 02/11/2021   Abscess of bursa of left ankle 01/21/2021   Dehiscence of amputation stump (HCC)    Ischemia of left BKA site ([place])    Abscess of leg without foot, left    Osteomyelitis of second toe of right foot ([place]) 07/12/2019   Below knee amputation status, left 01/25/2018   Wound dehiscence, surgical    Status post left foot surgery 12/15/2017   GERD (gastroesophageal reflux disease) 09/26/2017   Hyperlipidemia associated with type 2 diabetes mellitus ([place]) 07/08/2017    Snoring 06/07/2016   Chest pain 01/22/2016   Abnormal nuclear stress test 01/22/2016   Pain, chronic due to trauma 07/04/2012   Complex regional pain syndrome I of lower limb 07/04/2012   COPD (chronic obstructive pulmonary disease) ([place]) 01/27/2011   Pre-operative cardiovascular examination 01/27/2011   Nonspecific abnormal electrocardiogram (ECG) (EKG) 01/27/2011   Murmur 01/27/2011   DM2 (diabetes mellitus, type 2) ([place])    HTN (hypertension)    HLD (hyperlipidemia)    Tobacco abuse    Past Medical History:  Diagnosis Date   Acute renal failure ([place])    in setting of NSAID use and orthopedic surgery 2010   Anxiety and depression    Chronic diastolic CHF (congestive heart failure) ([place])    a. Echo 6/17: severe conc LVH, vigorous EF, EF 65-70%, no dynamic obstruction, no RWMA, Gr 1 DD, mild TR  //  b. LHC 8/17: no sig CAD, LVEDP 28   COPD (chronic obstructive pulmonary disease) (HCC)    Diabetic ulcer of left foot (HCC)    DM2 (diabetes mellitus, type 2) ([place])    Dysrhythmia    Family history of early CAD    Fatty liver    GERD (gastroesophageal reflux disease)    History of amputation of foot (HCC)    L trans-met // R toe   History of cardiac catheterization    a. [place] 2002: irregs  //  b. LHC in 8/17: no sig CAD, apical DK, hyperdynamic LV, LVEDP 28   History of kidney stones    History of nuclear stress test    a. Nuc 7/17: Overall, intermediate risk nuclear stress test secondary to small size of apical lateral defect and reduced ejection fraction.  EF 43%   HLD (hyperlipidemia)    HTN (hypertension)    Hx of BKA, left ([place]) 01/03/2018   Injuries     crushing injury to both his feet in February 2010.    Kidney calculi    Palpitations    PTSD (post-traumatic stress disorder)    Tobacco abuse     Family History  Problem Relation Age of Onset   Leukemia Mother 26       died   Lung cancer Father 39       died   Heart attack Brother 26   Heart attack Brother 77    Hypertension Brother        X3   Hypertension Sister        X2   Diabetes Sister    Stroke Sister    Diabetes Sister    Other Brother        Musician accident    Past Surgical History:  Procedure Laterality Date   AMPUTATION Left 01/03/2018   Procedure: LEFT MIDFOOT AMPUTATION/REVISION MIDAMPUTAION;  Surgeon: Mcarthur Rossetti, MD;  Location: [facility];  Service: Orthopedics;  Laterality: Left;   AMPUTATION Left 01/25/2018   Procedure: LEFT BELOW KNEE AMPUTATION;  Surgeon: [name], MD;  Location: [facility];  Service: Orthopedics;  Laterality: Left;   AMPUTATION Left 01/21/2021   Procedure: LEFT ABOVE KNEE AMPUTATION;  Surgeon: [name], MD;  Location: [facility];  Service: Orthopedics;  Laterality: Left;   AMPUTATION TOE Right 07/17/2019   Procedure: AMPUTATION RIGHT FOOT 2ND TOE;  Surgeon: Mcarthur Rossetti, MD;  Location: [facility];  Service: Orthopedics;  Laterality: Right;   APPLICATION OF WOUND VAC Left 01/21/2021   Procedure: APPLICATION OF WOUND VAC;  Surgeon: [name], MD;  Location: [facility];  Service: Orthopedics;  Laterality: Left;   BELOW KNEE LEG AMPUTATION Left 01/25/2018   CARDIAC CATHETERIZATION N/A 01/22/2016   Procedure: Left Heart Cath and Coronary Angiography;  Surgeon: Peter M Martinique, MD;  Location: [facility];  Service: Cardiovascular;  Laterality: N/A;   FOOT AMPUTATION Bilateral    I & D EXTREMITY Left 12/15/2017   Procedure: IRRIGATION AND DEBRIDEMENT LEFT FOOT ULCER;  Surgeon: Mcarthur Rossetti, MD;  [facility];  Service: Orthopedics;  Laterality: Left;   I & D EXTREMITY Left 07/25/2020   Procedure: LEFT BELOW KNEE AMPUTATION ABSCESS EXCISION AND SKIN GRAFT;  Surgeon: [name], MD;  Location: [facility];  Service: Orthopedics;  Laterality: Left;   I & D EXTREMITY Left 08/22/2020   Procedure: DEBRIDEMENT LEFT BELOW KNEE AMPUTATION AND APPLY KERECIS SKIN GRAFT;  Surgeon: [name], MD;  Location: [facility];  Service: Orthopedics;   Laterality: Left;   LITHOTRIPSY     TENDON LENGTHENING Bilateral    calf   TONSILLECTOMY     Social History   Occupational History   Occupation: DISABLED  Tobacco Use   Smoking status: Every Day    Packs/day: 0.75    Years: 40.00    Pack years: 30.00    Types: Cigarettes   Smokeless tobacco: Never  Vaping Use   Vaping Use: Never used  Substance and Sexual Activity   Alcohol use: No   Drug use: No   Sexual activity: Not on file

## 2021-03-30 ENCOUNTER — Encounter: Payer: Medicare HMO | Admitting: Physical Therapy

## 2021-04-01 ENCOUNTER — Encounter: Payer: Self-pay | Admitting: Family

## 2021-04-01 ENCOUNTER — Ambulatory Visit (INDEPENDENT_AMBULATORY_CARE_PROVIDER_SITE_OTHER): Payer: Medicare HMO | Admitting: Family

## 2021-04-01 DIAGNOSIS — L97511 Non-pressure chronic ulcer of other part of right foot limited to breakdown of skin: Secondary | ICD-10-CM

## 2021-04-01 NOTE — Progress Notes (Signed)
Office Visit Note   Patient: Troy [name]           Date of Birth: 10/03/1960           MRN: [id number] Visit Date: 04/01/2021              Requested by: No referring provider defined for this encounter. PCP: Pcp, No  Chief Complaint  Patient presents with   Left Leg - Routine Post Op      HPI: The patient is a 60 year old gentleman who presents today for right foot evaluation.  He is status post recent left above-knee amputation.  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  The right foot has Wagner grade 1 ulcer this does have callus buildup.  They are quite pleased with resolution of this  Assessment & Plan: Visit Diagnoses:  1. Right foot ulcer, limited to breakdown of skin ([place])     Plan: Proceed with prosthesis set up.  Protected weightbearing on the right.  Follow-up in the office in 4 more weeks for reevaluation of the right foot  Follow-Up Instructions: Return in about 4 weeks (around 04/29/2021).   Ortho Exam  Patient is alert, oriented, no adenopathy, well-dressed, normal affect, normal respiratory effort. On examination of the right foot there is Wagner grade 1 ulcer beneath the first metatarsal head on the right this is nickel sized there is some central dried blood this does not probe to bone or tendon there is no active drainage after informed consent this was debrided with a 10 blade knife back to viable tissue.  There is no surrounding erythema no swelling no sign of infection  Imaging: No results found. No images are attached to the encounter.  Labs: Lab Results  Component Value Date   HGBA1C 12.3 (H) 01/21/2021   HGBA1C 11.0 (H) 12/13/2017   REPTSTATUS 02/13/2021 FINAL 02/11/2021   GRAMSTAIN  08/22/2020    FEW WBC PRESENT,BOTH PMN AND MONONUCLEAR ABUNDANT GRAM POSITIVE COCCI IN PAIRS    CULT  02/11/2021    NO GROWTH Performed at [facility], [place] [address], [place], [place] [address]     [place]abin OXYTOCA 08/22/2020     Lab Results  Component Value Date   ALBUMIN 3.3 (L) 02/11/2021   ALBUMIN 3.5 08/22/2020   ALBUMIN 3.5 07/25/2020    Lab Results  Component Value Date   MG 1.4 (L) 10/22/2018   MG 1.9 08/14/2008   MG 2.2 08/11/2008   No results found for: VD25OH  No results found for: PREALBUMIN CBC EXTENDED Latest Ref Rng & Units 02/13/2021 02/12/2021 02/11/2021  WBC 4.0 - 10.5 K/uL 9.7 14.2(H) 17.6(H)  RBC 4.22 - 5.81 MIL/uL 4.48 4.25 4.79  HGB 13.0 - 17.0 g/dL 12.6(L) 12.1(L) 14.1  HCT 39.0 - 52.0 % 39.0 37.0(L) 42.5  PLT 150 - 400 K/uL 166 183 267  NEUTROABS 1.7 - 7.7 K/uL - - 13.1(H)  LYMPHSABS 0.7 - 4.0 K/uL - - 2.9     There is no height or weight on file to calculate BMI.  Orders:  No orders of the defined types were placed in this encounter.  No orders of the defined types were placed in this encounter.    Procedures: No procedures performed  Clinical Data: No additional findings.  ROS:  All other systems negative, except as noted in the HPI. Review of Systems  Constitutional:  Negative for chills and fever.  Cardiovascular:  Negative for  leg swelling.  Skin:  Positive for wound. Negative for color change.   Objective: Vital Signs: There were no vitals taken for this visit.  Specialty Comments:  No specialty comments available.  PMFS History: Patient Active Problem List   Diagnosis Date Noted   S/P AKA (above knee amputation), left ([place]) 02/12/2021   Amputation of right great toe and 2nd Toe 02/12/2021   Sepsis due to left-sided neck cellulitis/MRSA 02/12/2021   Hypotensive episode    Elevated troponin    SIRS (systemic inflammatory response syndrome) ([place]) 02/11/2021   Abscess of bursa of left ankle 01/21/2021   Dehiscence of amputation stump (HCC)    Ischemia of left BKA site ([place])    Abscess of leg without foot, left    Osteomyelitis of second toe of right foot ([place]) 07/12/2019   Below knee amputation status,  left 01/25/2018   Wound dehiscence, surgical    Status post left foot surgery 12/15/2017   GERD (gastroesophageal reflux disease) 09/26/2017   Hyperlipidemia associated with type 2 diabetes mellitus ([place]) 07/08/2017   Snoring 06/07/2016   Chest pain 01/22/2016   Abnormal nuclear stress test 01/22/2016   Pain, chronic due to trauma 07/04/2012   Complex regional pain syndrome I of lower limb 07/04/2012   COPD (chronic obstructive pulmonary disease) ([place]) 01/27/2011   Pre-operative cardiovascular examination 01/27/2011   Nonspecific abnormal electrocardiogram (ECG) (EKG) 01/27/2011   Murmur 01/27/2011   DM2 (diabetes mellitus, type 2) ([place])    HTN (hypertension)    HLD (hyperlipidemia)    Tobacco abuse    Past Medical History:  Diagnosis Date   Acute renal failure ([place])    in setting of NSAID use and orthopedic surgery 2010   Anxiety and depression    Chronic diastolic CHF (congestive heart failure) ([place])    a. Echo 6/17: severe conc LVH, vigorous EF, EF 65-70%, no dynamic obstruction, no RWMA, Gr 1 DD, mild TR  //  b. LHC 8/17: no sig CAD, LVEDP 28   COPD (chronic obstructive pulmonary disease) (HCC)    Diabetic ulcer of left foot (HCC)    DM2 (diabetes mellitus, type 2) ([place])    Dysrhythmia    Family history of early CAD    Fatty liver    GERD (gastroesophageal reflux disease)    History of amputation of foot (HCC)    L trans-met // R toe   History of cardiac catheterization    a. [place] 2002: irregs  //  b. LHC in 8/17: no sig CAD, apical DK, hyperdynamic LV, LVEDP 28   History of kidney stones    History of nuclear stress test    a. Nuc 7/17: Overall, intermediate risk nuclear stress test secondary to small size of apical lateral defect and reduced ejection fraction.  EF 43%   HLD (hyperlipidemia)    HTN (hypertension)    Hx of BKA, left ([place]) 01/03/2018   Injuries     crushing injury to both his feet in February 2010.    Kidney calculi    Palpitations    PTSD  (post-traumatic stress disorder)    Tobacco abuse     Family History  Problem Relation Age of Onset   Leukemia Mother 62       died   Lung cancer Father 17       died   Heart attack Brother 22   Heart attack Brother 42   Hypertension Brother        X3   Hypertension Sister  X2   Diabetes Sister    Stroke Sister    Diabetes Sister    Other Brother        Musician accident    Past Surgical History:  Procedure Laterality Date   AMPUTATION Left 01/03/2018   Procedure: LEFT MIDFOOT AMPUTATION/REVISION MIDAMPUTAION;  Surgeon: Mcarthur Rossetti, MD;  Location: [facility];  Service: Orthopedics;  Laterality: Left;   AMPUTATION Left 01/25/2018   Procedure: LEFT BELOW KNEE AMPUTATION;  Surgeon: [name], MD;  Location: [facility];  Service: Orthopedics;  Laterality: Left;   AMPUTATION Left 01/21/2021   Procedure: LEFT ABOVE KNEE AMPUTATION;  Surgeon: [name], MD;  Location: [facility];  Service: Orthopedics;  Laterality: Left;   AMPUTATION TOE Right 07/17/2019   Procedure: AMPUTATION RIGHT FOOT 2ND TOE;  Surgeon: Mcarthur Rossetti, MD;  Location: [facility];  Service: Orthopedics;  Laterality: Right;   APPLICATION OF WOUND VAC Left 01/21/2021   Procedure: APPLICATION OF WOUND VAC;  Surgeon: [name], MD;  Location: [facility];  Service: Orthopedics;  Laterality: Left;   BELOW KNEE LEG AMPUTATION Left 01/25/2018   CARDIAC CATHETERIZATION N/A 01/22/2016   Procedure: Left Heart Cath and Coronary Angiography;  Surgeon: Peter M Martinique, MD;  Location: [facility];  Service: Cardiovascular;  Laterality: N/A;   FOOT AMPUTATION Bilateral    I & D EXTREMITY Left 12/15/2017   Procedure: IRRIGATION AND DEBRIDEMENT LEFT FOOT ULCER;  Surgeon: Mcarthur Rossetti, MD;  Location: [facility];  Service: Orthopedics;  Laterality: Left;   I & D EXTREMITY Left 07/25/2020   Procedure: LEFT BELOW KNEE AMPUTATION ABSCESS EXCISION AND SKIN GRAFT;  Surgeon: [name], MD;  Location: [facility];  Service: Orthopedics;  Laterality: Left;   I & D EXTREMITY Left 08/22/2020   Procedure: DEBRIDEMENT LEFT BELOW KNEE AMPUTATION AND APPLY KERECIS SKIN GRAFT;  Surgeon: [name], MD;  Location: [facility];  Service: Orthopedics;  Laterality: Left;   LITHOTRIPSY     TENDON LENGTHENING Bilateral    calf   TONSILLECTOMY     Social History   Occupational History   Occupation: DISABLED  Tobacco Use   Smoking status: Every Day    Packs/day: 0.75    Years: 40.00    Pack years: 30.00    Types: Cigarettes   Smokeless tobacco: Never  Vaping Use   Vaping Use: Never used  Substance and Sexual Activity   Alcohol use: No   Drug use: No   Sexual activity: Not on file

## 2021-04-10 ENCOUNTER — Encounter (HOSPITAL_COMMUNITY): Payer: Self-pay | Admitting: Orthopedic Surgery

## 2021-04-27 ENCOUNTER — Encounter: Payer: Self-pay | Admitting: Physical Therapy

## 2021-04-27 ENCOUNTER — Ambulatory Visit: Payer: Medicare HMO | Admitting: Physical Therapy

## 2021-04-27 ENCOUNTER — Other Ambulatory Visit: Payer: Self-pay

## 2021-04-27 DIAGNOSIS — M25652 Stiffness of left hip, not elsewhere classified: Secondary | ICD-10-CM

## 2021-04-27 DIAGNOSIS — M6281 Muscle weakness (generalized): Secondary | ICD-10-CM | POA: Diagnosis not present

## 2021-04-27 DIAGNOSIS — R2681 Unsteadiness on feet: Secondary | ICD-10-CM

## 2021-04-27 DIAGNOSIS — R2689 Other abnormalities of gait and mobility: Secondary | ICD-10-CM

## 2021-04-27 DIAGNOSIS — R293 Abnormal posture: Secondary | ICD-10-CM

## 2021-04-27 NOTE — Therapy (Signed)
[facility] [address] [place], [place], [address] Phone: [phone]   Fax:  [phone]  Physical Therapy Evaluation  Patient Details  Name: Troy [name] MRN: [id number] Date of Birth: 07/07/1960 Referring Provider (PT): Troy Prader, NP   Encounter Date: 04/27/2021   PT End of Session - 04/27/21 1631     Visit Number 1    Number of Visits 26    Date for PT Re-Evaluation 07/23/21    Authorization Type [employer],    Authorization Time Period $35 copay, no visit limit    PT Start Time 1004    PT Stop Time 2947    PT Time Calculation (min) 43 min    Equipment Utilized During Treatment Gait belt    Activity Tolerance Patient tolerated treatment well    Behavior During Therapy [facility] for tasks assessed/performed;Flat affect             Past Medical History:  Diagnosis Date   Acute renal failure ([place])    in setting of NSAID use and orthopedic surgery 2010   Anxiety and depression    Chronic diastolic CHF (congestive heart failure) ([place])    a. Echo 6/17: severe conc LVH, vigorous EF, EF 65-70%, no dynamic obstruction, no RWMA, Gr 1 DD, mild TR  //  b. LHC 8/17: no sig CAD, LVEDP 28   COPD (chronic obstructive pulmonary disease) (HCC)    Diabetic ulcer of left foot (HCC)    DM2 (diabetes mellitus, type 2) ([place])    Dysrhythmia    Family history of early CAD    Fatty liver    GERD (gastroesophageal reflux disease)    History of amputation of foot (HCC)    L trans-met // R toe   History of cardiac catheterization    a. [place] 2002: irregs  //  b. LHC in 8/17: no sig CAD, apical DK, hyperdynamic LV, LVEDP 28   History of kidney stones    History of nuclear stress test    a. Nuc 7/17: Overall, intermediate risk nuclear stress test secondary to small size of apical lateral defect and reduced ejection fraction.  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    Kidney calculi    Palpitations    PTSD (post-traumatic stress disorder)    Tobacco abuse     Past Surgical History:  Procedure Laterality Date   AMPUTATION Left 01/03/2018   Procedure: LEFT MIDFOOT AMPUTATION/REVISION MIDAMPUTAION;  Surgeon: Troy Rossetti, MD;  Location: [place];  Service: Orthopedics;  Laterality: Left;   AMPUTATION Left 01/25/2018   Procedure: LEFT BELOW KNEE AMPUTATION;  Surgeon: Troy Minion, MD;  Location: [place];  Service: Orthopedics;  Laterality: Left;   AMPUTATION Left 01/21/2021   Procedure: LEFT ABOVE KNEE AMPUTATION;  Surgeon: Troy Minion, MD;  Location: [place];  Service: Orthopedics;  Laterality: Left;   AMPUTATION TOE Right 07/17/2019   Procedure: AMPUTATION RIGHT FOOT 2ND TOE;  Surgeon: Troy Rossetti, MD;  Location: [place];  Service: Orthopedics;  Laterality: Right;   APPLICATION OF WOUND VAC Left 01/21/2021   Procedure: APPLICATION OF WOUND VAC;  Surgeon: Troy Minion, MD;  Location: [place];  Service: Orthopedics;  Laterality: Left;   BELOW KNEE LEG AMPUTATION Left 01/25/2018   CARDIAC CATHETERIZATION N/A 01/22/2016  Procedure: Left Heart Cath and Coronary Angiography;  Surgeon: Troy M Martinique, MD;  Location: [place] [facility];  Service: Cardiovascular;  Laterality: N/A;   FOOT AMPUTATION Bilateral    I & D EXTREMITY Left 12/15/2017   Procedure: IRRIGATION AND DEBRIDEMENT LEFT FOOT ULCER;  Surgeon: Troy Rossetti, MD;  Location: [facility];  Service: Orthopedics;  Laterality: Left;   I & D EXTREMITY Left 07/25/2020   Procedure: LEFT BELOW KNEE AMPUTATION ABSCESS EXCISION AND SKIN GRAFT;  Surgeon: Troy Minion, MD;  Location: [place];  Service: Orthopedics;  Laterality: Left;   I & D EXTREMITY Left 08/22/2020   Procedure: DEBRIDEMENT LEFT BELOW KNEE AMPUTATION AND APPLY KERECIS SKIN GRAFT;  Surgeon: Troy Minion, MD;  Location: [place];  Service: Orthopedics;  Laterality: Left;    LITHOTRIPSY     TENDON LENGTHENING Bilateral    calf   TONSILLECTOMY      There were no vitals filed for this visit.    Subjective Assessment - 04/27/21 1006     Subjective This 60yo male was referred to PT by [place], PA with [id number] (ICD-10-CM) - S/P AKA (above knee amputation), left.  He has history of left Transtibial Amputation 01/25/2018 which became abscessed in Febraury 2022 which did respond to care so he underwent a Left Transfemoral Amputation on 01/21/2021.  He has right foot ulcer with plan for protected weight bearing.  His prosthesis was delivered on 04/27/2021 just prior to PT evaluation.    Patient is accompained by: Family member   wife, Troy [name]   Pertinent History CHF, COPD, DM, HTN, restless leg syndrome, RLE 1st & 2nd toe amputations    Limitations Lifting;Standing;House hold activities;Walking    Patient Stated Goals To use prosthesis in home & community. Fishing, go to races.    Currently in Pain? No/denies                [facility] PT Assessment - 04/27/21 1005       Assessment   Medical Diagnosis Z89.612 (ICD-10-CM) - S/P AKA (above knee amputation), left    Referring Provider (PT) Troy Prader, NP    Onset Date/Surgical Date 04/27/21   prosthesis delivery   Hand Dominance Right    Prior Therapy HHPT 1x      Precautions   Precautions Fall      Restrictions   Weight Bearing Restrictions No    Other Position/Activity Restrictions pt reports wound on right foot has healed.      Balance Screen   Has the patient fallen in the past 6 months No    Has the patient had a decrease in activity level because of a fear of falling?  Yes    Is the patient reluctant to leave their home because of a fear of falling?  No   uses w/c     Home Environment   Living Environment Private residence    Living Arrangements Spouse/significant other   ~50# dog   Type of [place] One level   single ~4" step into guest  bathroom   [place] - 2 wheels;Cane - single point;Bedside commode;Tub bench;Grab bars - tub/shower;Grab bars - toilet;Hand held shower head;Wheelchair - Press photographer      Prior Function   Level of Independence Independent;Independent with household mobility without device;Independent with community mobility without device   only required BKA prosthesis prior to infection Jan 2022  Vocation On disability    Leisure fishing, go to races      Posture/Postural Control   Posture/Postural Control Postural limitations    Postural Limitations Rounded Shoulders;Forward head;Flexed trunk;Weight shift right      ROM / Strength   AROM / PROM / Strength PROM;Strength      PROM   Overall PROM Comments Left hip extension standing -20*,  functionally right Iliopsoas, hamstrings & gastroc appear tight      Strength   Overall Strength Deficits    Overall Strength Comments RLE functionally appears ~4/5 in hip, knee & ankle with impaired circulation limiting muscle endurance.    Left Hip Flexion 4/5    Left Hip Extension 3/5   tested functionally standing & grossly seated   Left Hip ABduction 3+/5   tested functionally standing & grossly seated     Transfers   Transfers Sit to Stand;Stand to Sit;Stand Pivot Transfers    Sit to Stand 5: Supervision;With upper extremity assist;With armrests;From chair/3-in-1;From elevated surface;Other (comment)   to RW for stabilization   Sit to Stand Details (indicate cue type and reason) delayed prosthetic knee extension & only once upright using RW    Stand to Sit 5: Supervision;With upper extremity assist;With armrests;To elevated surface;To chair/3-in-1;Other (comment)   from RW for stability   Stand to Sit Details delayed prosthetic knee flexion & only using RW    Stand Pivot Transfers 5: Supervision   with RW & TFA prosthesis   Stand Pivot Transfer Details (indicate cue type and reason) limited weight shift onto prosthesis in stance and  impaired prosthetic knee control      Ambulation/Gait   Ambulation/Gait Yes    Ambulation/Gait Assistance 5: Supervision    Ambulation/Gait Assistance Details excessive BUE support on RW, poor prosthetic knee control 7 times but able to maintain balance using RW    Ambulation Distance (Feet) 100 Feet    Assistive device Rolling walker;Prosthesis    Gait Pattern Step-to pattern;Decreased step length - right;Decreased stance time - left;Decreased hip/knee flexion - left;Decreased weight shift to left;Left circumduction;Left hip hike;Left flexed knee in stance;Antalgic;Lateral hip instability;Trunk flexed   inconsistent step width from adduction causing Trendelenburg to abduction causing lateral trunk lean.   Ambulation Surface Level;Indoor    Gait velocity 0.66 ft/sec      Standardized Balance Assessment   Standardized Balance Assessment Berg Balance Test      Berg Balance Test   Sit to Stand Needs minimal aid to stand or to stabilize   Berg task with RW support = 2   Standing Unsupported Needs several tries to stand 30 seconds unsupported   Berg task with RW support = 3   Sitting with Back Unsupported but Feet Supported on Floor or Stool Able to sit safely and securely 2 minutes    Stand to Sit Needs assistance to sit   Berg task with RW support = 3   Transfers Able to transfer safely, definite need of hands    Standing Unsupported with Eyes Closed Needs help to keep from falling   Berg task with RW support = 1   Standing Unsupported with Feet Together Needs help to attain position and unable to hold for 15 seconds   Berg task with RW support = 2   From Standing, Reach Forward with Outstretched Arm Loses balance while trying/requires external support   Berg task with RW support = 1   From Standing Position, Pick up Object from Floor Unable to try/needs assist  to keep balance   Berg task with RW support = 1   From Standing Position, Turn to Look Behind Over each Shoulder Needs assist to keep  from losing balance and falling   Berg task with RW support = 1   Turn 360 Degrees Needs assistance while turning   Berg task with RW support = 1   Standing Unsupported, Alternately Place Feet on Step/Stool Needs assistance to keep from falling or unable to try   Berg task with RW support = 0   Standing Unsupported, One Foot in [employer] balance while stepping or standing   Berg task with RW support = 1   Standing on One Leg Unable to try or needs assist to prevent fall   Berg task with RW support = 3   Total Score 9    Berg comment: Berg task with RW support = 23/56             Prosthetics Assessment - 04/27/21 [place] with Skin check;Residual limb care;Care of non-amputated limb;Prosthetic cleaning;Ply sock cleaning;Correct ply sock adjustment;Proper wear schedule/adjustment;Proper weight-bearing schedule/adjustment    Donning prosthesis  Min assist    Doffing prosthesis  Supervision    Current prosthetic wear tolerance (days/week)  prosthesis delivered just prior to PT eval    Current prosthetic wear tolerance (#hours/day)  prosthesis delivered just prior to PT eval    Current prosthetic weight-bearing tolerance (hours/day)  Patient tolerated standing with partial weight on prosthesis for 5 min without c/o residual limb pain    Residual limb condition  no open areas, scar invaginated posteriorly in 3 places with lint & moisture present, cylinderical shape, minimal hair growth, dry skin    Prosthesis Description silicon liner with shuttle pin lock suspension, ischial containment socket, multi-axial knee with manual lock option,                       Objective measurements completed on examination: See above findings.       [place] Adult PT Treatment/Exercise - 04/27/21 1005       Prosthetics   Prosthetic Care Comments  PT recommended daily wear 2hrs 3x/day with plan to increase every 5-7 days.    Education Provided Skin  check;Residual limb care;Prosthetic cleaning;Correct ply sock adjustment;Proper Donning;Proper wear schedule/adjustment;Other (comment)   see prosthetic care comments   Person(s) Educated Patient;Spouse    Education Method Explanation;Verbal cues;Demonstration;Tactile cues    Education Method Verbalized understanding;Tactile cues required;Verbal cues required;Needs further instruction                       PT Short Term Goals - 04/27/21 1644       PT SHORT TERM GOAL #1   Title Patient donnes prosthesis modified independent & verbalizes proper cleaning.    Time 1    Period Months    Status New    Target Date 05/28/21      PT SHORT TERM GOAL #2   Title Patient tolerates prosthesis >9 hrs total /day without skin issues or limb pain    Time 1    Period Months    Status New    Target Date 05/28/21      PT SHORT TERM GOAL #3   Title Patient able to pick up items from floor & reach 10" with RW support safely.    Time 1    Period Months  Status New    Target Date 05/28/21      PT SHORT TERM GOAL #4   Title Patient ambulates 200' with RW & prosthesis with supervision.    Time 1    Period Months    Status New    Target Date 05/28/21      PT SHORT TERM GOAL #5   Title .Patient negotiates ramps & curbs with RW & prosthesis with minA.    Time 1    Period Months    Status New    Target Date 05/28/21               PT Long Term Goals - 04/27/21 1641       PT LONG TERM GOAL #1   Title Patient demonstrates & verbalized understanding of prosthetic care to enable safe utilization of prosthesis.    Time 3    Period Months    Status New    Target Date 07/23/20      PT LONG TERM GOAL #2   Title Patient tolerates prosthesis wear >90% of awake hours without skin or limb pain issues.    Time 3    Period Months    Status New    Target Date 07/23/20      PT LONG TERM GOAL #3   Title Berg Balance >/= 36/56 to indicate lower fall risk    Time 3    Period  Months    Status New    Target Date 07/23/20      PT LONG TERM GOAL #4   Title Patient ambulates 300' with LRAD & prosthesis modified independent    Time 3    Period Months    Status New    Target Date 07/23/20      PT LONG TERM GOAL #5   Title Patient negotiates ramps, curbs & stairs with LRAD & prosthesis modified independent.    Time 3    Period Months    Status New    Target Date 07/23/20                    Plan - 04/27/21 1634     Clinical Impression Statement This 60yo male underwent a Left Transfemoral Amputation on 8/l3/2022 and recieved his prosthesisl 04/27/2021.  He has weakness & significant deconditioning with prolonged limited mobility. He is dependent in prosthetic care which increases the risk of skin issues.  He has limited wear which impairs function throughout his day.  Berg Balance of 9/56 and tasks of Berg with walker support 23/56 both indicate high fall risk.  His prosthetic gait has significant deviations including excessive UE weight bearing on walker and gait velocity 0.66 ft/sec with high fall risk.  Patient would benefit from skilled PT intervention to improve function & safety with his prosthesis.    Personal Factors and Comorbidities Comorbidity 3+;Time since onset of injury/illness/exacerbation;Fitness    Comorbidities CHF, COPD, DM, HTN, restless leg syndrome, RLE 1st & 2nd toe amputations    Examination-Activity Limitations Lift;Locomotion Level;Squat;Stairs;Stand;Transfers    Examination-Participation Restrictions Community Activity    Stability/Clinical Decision Making Evolving/Moderate complexity    Clinical Decision Making Moderate    Rehab Potential Good    PT Frequency 2x / week    PT Duration Other (comment)   90 days (13 weeks)   PT Treatment/Interventions ADLs/Self Care Home Management;DME Instruction;Gait training;Stair training;Functional mobility training;Therapeutic activities;Therapeutic exercise;Balance training;Neuromuscular  re-education;Patient/family education;Prosthetic Training;Passive range of motion;Vestibular    PT Next Visit Plan  review prosthetic care, HEP at sink, prosthetic gait with RW    Consulted and Agree with Plan of Care Patient;Family member/caregiver    Family Member Consulted wife, Byrd Rushlow             Patient will benefit from skilled therapeutic intervention in order to improve the following deficits and impairments:  Abnormal gait, Decreased activity tolerance, Decreased balance, Decreased endurance, Decreased knowledge of use of DME, Decreased mobility, Decreased range of motion, Decreased strength, Postural dysfunction, Prosthetic Dependency, Impaired flexibility  Visit Diagnosis: Unsteadiness on feet  Other abnormalities of gait and mobility  Muscle weakness (generalized)  Stiffness of left hip, not elsewhere classified  Abnormal posture     Problem List Patient Active Problem List   Diagnosis Date Noted   S/P AKA (above knee amputation), left ([place]) 02/12/2021   Amputation of right great toe and 2nd Toe 02/12/2021   Sepsis due to left-sided neck cellulitis/MRSA 02/12/2021   Hypotensive episode    Elevated troponin    SIRS (systemic inflammatory response syndrome) ([place]) 02/11/2021   Abscess of bursa of left ankle 01/21/2021   Dehiscence of amputation stump (HCC)    Ischemia of left BKA site ([place])    Abscess of leg without foot, left    Osteomyelitis of second toe of right foot ([place]) 07/12/2019   Below knee amputation status, left 01/25/2018   Wound dehiscence, surgical    Status post left foot surgery 12/15/2017   GERD (gastroesophageal reflux disease) 09/26/2017   Hyperlipidemia associated with type 2 diabetes mellitus ([place]) 07/08/2017   Snoring 06/07/2016   Chest pain 01/22/2016   Abnormal nuclear stress test 01/22/2016   Pain, chronic due to trauma 07/04/2012   Complex regional pain syndrome I of lower limb 07/04/2012   COPD (chronic obstructive pulmonary  disease) ([place]) 01/27/2011   Pre-operative cardiovascular examination 01/27/2011   Nonspecific abnormal electrocardiogram (ECG) (EKG) 01/27/2011   Murmur 01/27/2011   DM2 (diabetes mellitus, type 2) ([place])    HTN (hypertension)    HLD (hyperlipidemia)    Tobacco abuse     Jamey Reas, PT, DPT 04/27/2021, 4:47 PM  [facility] [address] [place], [place], [address] Phone: [phone]   Fax:  [phone]  Name: Troy [name] MRN: [id number] Date of Birth: 08-08-1960

## 2021-04-30 ENCOUNTER — Other Ambulatory Visit: Payer: Self-pay

## 2021-04-30 ENCOUNTER — Ambulatory Visit: Payer: Medicare HMO | Admitting: Orthopedic Surgery

## 2021-04-30 ENCOUNTER — Ambulatory Visit (INDEPENDENT_AMBULATORY_CARE_PROVIDER_SITE_OTHER): Payer: Medicare HMO | Admitting: Physical Therapy

## 2021-04-30 ENCOUNTER — Encounter: Payer: Self-pay | Admitting: Physical Therapy

## 2021-04-30 DIAGNOSIS — M6281 Muscle weakness (generalized): Secondary | ICD-10-CM

## 2021-04-30 DIAGNOSIS — Z89612 Acquired absence of left leg above knee: Secondary | ICD-10-CM | POA: Diagnosis not present

## 2021-04-30 DIAGNOSIS — R2689 Other abnormalities of gait and mobility: Secondary | ICD-10-CM

## 2021-04-30 DIAGNOSIS — R293 Abnormal posture: Secondary | ICD-10-CM

## 2021-04-30 DIAGNOSIS — L97511 Non-pressure chronic ulcer of other part of right foot limited to breakdown of skin: Secondary | ICD-10-CM

## 2021-04-30 DIAGNOSIS — R2681 Unsteadiness on feet: Secondary | ICD-10-CM | POA: Diagnosis not present

## 2021-04-30 NOTE — Therapy (Addendum)
[facility] Physical Therapy [address] [place], [place], [address] Phone: [phone]   Fax:  [phone]  PHYSICAL THERAPY DISCHARGE SUMMARY  Visits from Start of Care: 2  Current functional level related to goals / functional outcomes: Patient did not return so unable to reassess goals.    Remaining deficits: See below for details of last PT visit. [name] called to report that he refused to come to PT.    Education / Equipment: Initial prosthetic care.    Patient agrees to discharge. Patient goals were not met. Patient is being discharged due to the patient's request.  07/28/2021 Jamey Reas, PT, DPT Physical Therapist Specializing in Prosthetic Rehab [facility]. [place], [place] [address] Phone [phone] FAX [phone]    Physical Therapy Treatment  Patient Details  Name: Troy [name] MRN: [id number] Date of Birth: 12/06/1960 Referring Provider (PT): Dondra Prader, NP   Encounter Date: 04/30/2021   PT End of Session - 04/30/21 0843     Visit Number 2    Number of Visits 26    Date for PT Re-Evaluation 07/23/21    Authorization Type [employer],    Authorization Time Period $35 copay, no visit limit    PT Start Time [id number]    PT Stop Time 0921    PT Time Calculation (min) 38 min    Equipment Utilized During Treatment Gait belt    Activity Tolerance Patient tolerated treatment well    Behavior During Therapy [facility] for tasks assessed/performed;Flat affect             Past Medical History:  Diagnosis Date   Acute renal failure ([place])    in setting of NSAID use and orthopedic surgery 2010   Anxiety and depression    Chronic diastolic CHF (congestive heart failure) ([place])    a. Echo 6/17: severe conc LVH, vigorous EF, EF 65-70%, no dynamic obstruction, no RWMA, Gr 1 DD, mild TR  //  b. LHC 8/17: no sig CAD, LVEDP 28   COPD (chronic obstructive pulmonary disease) (HCC)    Diabetic  ulcer of left foot (HCC)    DM2 (diabetes mellitus, type 2) ([place])    Dysrhythmia    Family history of early CAD    Fatty liver    GERD (gastroesophageal reflux disease)    History of amputation of foot (HCC)    L trans-met // R toe   History of cardiac catheterization    a. [place] 2002: irregs  //  b. LHC in 8/17: no sig CAD, apical DK, hyperdynamic LV, LVEDP 28   History of kidney stones    History of nuclear stress test    a. Nuc 7/17: Overall, intermediate risk nuclear stress test secondary to small size of apical lateral defect and reduced ejection fraction.  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    Kidney calculi    Palpitations    PTSD (post-traumatic stress disorder)    Tobacco abuse     Past Surgical History:  Procedure Laterality Date   AMPUTATION Left 01/03/2018   Procedure: LEFT MIDFOOT AMPUTATION/REVISION MIDAMPUTAION;  Surgeon: Mcarthur Rossetti, MD;  Location: [place];  Service: Orthopedics;  Laterality: Left;   AMPUTATION Left 01/25/2018   Procedure: LEFT BELOW KNEE AMPUTATION;  Surgeon: [name],  MD;  Location: [place];  Service: Orthopedics;  Laterality: Left;   AMPUTATION Left 01/21/2021   Procedure: LEFT ABOVE KNEE AMPUTATION;  Surgeon: [name], MD;  Location: [place];  Service: Orthopedics;  Laterality: Left;   AMPUTATION TOE Right 07/17/2019   Procedure: AMPUTATION RIGHT FOOT 2ND TOE;  Surgeon: Mcarthur Rossetti, MD;  Location: [place];  Service: Orthopedics;  Laterality: Right;   APPLICATION OF WOUND VAC Left 01/21/2021   Procedure: APPLICATION OF WOUND VAC;  Surgeon: [name], MD;  Location: [place];  Service: Orthopedics;  Laterality: Left;   BELOW KNEE LEG AMPUTATION Left 01/25/2018   CARDIAC CATHETERIZATION N/A 01/22/2016   Procedure: Left Heart Cath and Coronary Angiography;  Surgeon: Peter M Martinique, MD;   Location: [place] [facility];  Service: Cardiovascular;  Laterality: N/A;   FOOT AMPUTATION Bilateral    I & D EXTREMITY Left 12/15/2017   Procedure: IRRIGATION AND DEBRIDEMENT LEFT FOOT ULCER;  Surgeon: Mcarthur Rossetti, MD;  Location: [facility];  Service: Orthopedics;  Laterality: Left;   I & D EXTREMITY Left 07/25/2020   Procedure: LEFT BELOW KNEE AMPUTATION ABSCESS EXCISION AND SKIN GRAFT;  Surgeon: [name], MD;  Location: [place];  Service: Orthopedics;  Laterality: Left;   I & D EXTREMITY Left 08/22/2020   Procedure: DEBRIDEMENT LEFT BELOW KNEE AMPUTATION AND APPLY KERECIS SKIN GRAFT;  Surgeon: [name], MD;  Location: [place];  Service: Orthopedics;  Laterality: Left;   LITHOTRIPSY     TENDON LENGTHENING Bilateral    calf   TONSILLECTOMY      There were no vitals filed for this visit.   Subjective Assessment - 04/30/21 0844     Subjective He has not worn prosthesis since PT eval as it is uncomfortable.    Patient is accompained by: Family member   wife, Domonic Kimball   Pertinent History CHF, COPD, DM, HTN, restless leg syndrome, RLE 1st & 2nd toe amputations    Limitations Lifting;Standing;House hold activities;Walking    Patient Stated Goals To use prosthesis in home & community. Fishing, go to races.                               [place] Adult PT Treatment/Exercise - 04/30/21 0845       Transfers   Transfers Sit to Stand;Stand to Sit;Stand Pivot Transfers    Sit to Stand 5: Supervision;With upper extremity assist;With armrests;From chair/3-in-1;From elevated surface;Other (comment)   to RW for stabilization   Sit to Stand Details Visual cues/gestures for sequencing;Verbal cues for sequencing;Verbal cues for technique;Verbal cues for safe use of DME/AE    Stand to Sit 5: Supervision;With upper extremity assist;With armrests;To elevated surface;To chair/3-in-1;Other (comment)   from RW for stability   Stand to Sit Details (indicate cue type and reason)  Verbal cues for sequencing;Visual cues/gestures for sequencing;Verbal cues for technique;Verbal cues for safe use of DME/AE    Stand Pivot Transfers --      Ambulation/Gait   Ambulation/Gait Yes    Ambulation/Gait Assistance 5: Supervision    Ambulation/Gait Assistance Details demo & verbal cues on proper step width & wt shift over prosthesis in stance.    Ambulation Distance (Feet) 100 Feet    Assistive device Rolling walker;Prosthesis    Gait Pattern Step-to pattern;Decreased step length - right;Decreased stance time - left;Decreased hip/knee flexion - left;Decreased weight shift to left;Left circumduction;Left hip hike;Left flexed  knee in stance;Antalgic;Lateral hip instability;Trunk flexed   inconsistent step width from adduction causing Trendelenburg to abduction causing lateral trunk lean.   Gait velocity --      Posture/Postural Control   Posture/Postural Control --    Postural Limitations --      Neuro Re-ed    Neuro Re-ed Details  HEP see pt instructions working on standing balance & tolerance and proprioception using residual limb / socket.  Only performed with UE support but discussed how to progress over time to less or no UE support. Pt & wife verbalized understanding.      Exercises   Exercises Knee/Hip;Other Exercises      Knee/Hip Exercises: Stretches   Hip Flexor Stretch Left;2 reps;30 seconds    Hip Flexor Stretch Limitations supine Modified Thomas with knee to chest with prosthesis over edge of bed      Prosthetics   Prosthetic Care Comments  PT recommended daily wear 2hrs 2-3x/day with plan to increase every 5-7 days.    Current prosthetic wear tolerance (days/week)  none since PT eval    Current prosthetic wear tolerance (#hours/day)  40 min during PT session    Current prosthetic weight-bearing tolerance (hours/day)  Patient tolerated standing with partial weight on prosthesis for 5 min without increase in residual limb pain    Residual limb condition  no open  areas, scar invaginated posteriorly in 3 places with lint & moisture present, cylinderical shape, minimal hair growth, dry skin    Education Provided Proper Donning;Proper wear schedule/adjustment;Other (comment)   see prosthetic care comments   Person(s) Educated Patient;Spouse    Education Method Explanation;Verbal cues    Education Method Verbalized understanding;Verbal cues required;Needs further [place] Prosthesis Supervision                     PT Education - 04/30/21 [place]     Education Details Access Code: [email or web address]  & HEP at sink - see pt instructions    Person(s) Educated Patient;Spouse    Methods Explanation;Demonstration;Tactile cues;Verbal cues;Handout    Comprehension Verbalized understanding;Returned demonstration;Verbal cues required;Tactile cues required;Need further instruction              PT Short Term Goals - 04/27/21 1644       PT SHORT TERM GOAL #1   Title Patient donnes prosthesis modified independent & verbalizes proper cleaning.    Time 1    Period Months    Status New    Target Date 05/28/21      PT SHORT TERM GOAL #2   Title Patient tolerates prosthesis >9 hrs total /day without skin issues or limb pain    Time 1    Period Months    Status New    Target Date 05/28/21      PT SHORT TERM GOAL #3   Title Patient able to pick up items from floor & reach 10" with RW support safely.    Time 1    Period Months    Status New    Target Date 05/28/21      PT SHORT TERM GOAL #4   Title Patient ambulates 200' with RW & prosthesis with supervision.    Time 1    Period Months    Status New    Target Date 05/28/21      PT SHORT TERM GOAL #5   Title .Patient negotiates ramps & curbs with RW & prosthesis with minA.    Time 1  Period Months    Status New    Target Date 05/28/21               PT Long Term Goals - 04/27/21 1641       PT LONG TERM GOAL #1   Title Patient demonstrates & verbalized understanding of  prosthetic care to enable safe utilization of prosthesis.    Time 3    Period Months    Status New    Target Date 07/23/20      PT LONG TERM GOAL #2   Title Patient tolerates prosthesis wear >90% of awake hours without skin or limb pain issues.    Time 3    Period Months    Status New    Target Date 07/23/20      PT LONG TERM GOAL #3   Title Berg Balance >/= 36/56 to indicate lower fall risk    Time 3    Period Months    Status New    Target Date 07/23/20      PT LONG TERM GOAL #4   Title Patient ambulates 300' with LRAD & prosthesis modified independent    Time 3    Period Months    Status New    Target Date 07/23/20      PT LONG TERM GOAL #5   Title Patient negotiates ramps, curbs & stairs with LRAD & prosthesis modified independent.    Time 3    Period Months    Status New    Target Date 07/23/20                   Plan - 04/30/21 0843     Clinical Impression Statement PT instructed in hip flexor stretch & standing HEP at sink.  He appears to understand the exercises.  He improved sit to/from stand with less UE use on RW required.    Personal Factors and Comorbidities Comorbidity 3+;Time since onset of injury/illness/exacerbation;Fitness    Comorbidities CHF, COPD, DM, HTN, restless leg syndrome, RLE 1st & 2nd toe amputations    Examination-Activity Limitations Lift;Locomotion Level;Squat;Stairs;Stand;Transfers    Examination-Participation Restrictions Community Activity    Stability/Clinical Decision Making Evolving/Moderate complexity    Rehab Potential Good    PT Frequency 2x / week    PT Duration Other (comment)   90 days (13 weeks)   PT Treatment/Interventions ADLs/Self Care Home Management;DME Instruction;Gait training;Stair training;Functional mobility training;Therapeutic activities;Therapeutic exercise;Balance training;Neuromuscular re-education;Patient/family education;Prosthetic Training;Passive range of motion;Vestibular    PT Next Visit Plan  review prosthetic care, prosthetic gait with RW including ramps & curbs    Consulted and Agree with Plan of Care Patient;Family member/caregiver    Family Member Consulted wife, Stevin Bielinski             Patient will benefit from skilled therapeutic intervention in order to improve the following deficits and impairments:  Abnormal gait, Decreased activity tolerance, Decreased balance, Decreased endurance, Decreased knowledge of use of DME, Decreased mobility, Decreased range of motion, Decreased strength, Postural dysfunction, Prosthetic Dependency, Impaired flexibility  Visit Diagnosis: Unsteadiness on feet  Other abnormalities of gait and mobility  Muscle weakness (generalized)  Abnormal posture     Problem List Patient Active Problem List   Diagnosis Date Noted   S/P AKA (above knee amputation), left ([place]) 02/12/2021   Amputation of right great toe and 2nd Toe 02/12/2021   Sepsis due to left-sided neck cellulitis/MRSA 02/12/2021   Hypotensive episode    Elevated troponin  SIRS (systemic inflammatory response syndrome) ([place]) 02/11/2021   Abscess of bursa of left ankle 01/21/2021   Dehiscence of amputation stump (HCC)    Ischemia of left BKA site ([place])    Abscess of leg without foot, left    Osteomyelitis of second toe of right foot ([place]) 07/12/2019   Below knee amputation status, left 01/25/2018   Wound dehiscence, surgical    Status post left foot surgery 12/15/2017   GERD (gastroesophageal reflux disease) 09/26/2017   Hyperlipidemia associated with type 2 diabetes mellitus ([place]) 07/08/2017   Snoring 06/07/2016   Chest pain 01/22/2016   Abnormal nuclear stress test 01/22/2016   Pain, chronic due to trauma 07/04/2012   Complex regional pain syndrome I of lower limb 07/04/2012   COPD (chronic obstructive pulmonary disease) ([place]) 01/27/2011   Pre-operative cardiovascular examination 01/27/2011   Nonspecific abnormal electrocardiogram (ECG) (EKG) 01/27/2011   Murmur  01/27/2011   DM2 (diabetes mellitus, type 2) ([place])    HTN (hypertension)    HLD (hyperlipidemia)    Tobacco abuse     Jamey Reas, PT, DPT 04/30/2021, 10:11 AM  [facility] Physical Therapy [address] [place], [place], [address] Phone: [phone]   Fax:  [phone]  Name: Troy [name] MRN: [id number] Date of Birth: 01-15-1961

## 2021-04-30 NOTE — Patient Instructions (Signed)
Access Code: [email or web address] URL: [email or web address] Date: 04/30/2021 Prepared by: Jamey Reas  Exercises Supine Dynamic Modified Karl Pock and Hip Flexor Dynamic Stretch - 2 x daily - 7 x weekly - 1 sets - 2-3 reps - 30 seconds hold   Do each exercise 1-2  times per day Do each exercise 5-10 repetitions Hold each exercise for 2 seconds to feel your location  AT [place].  Try to find this position when standing still for activities.   USE TAPE ON FLOOR TO MARK THE MIDLINE POSITION which is even with middle of sink.  You also should try to feel with your limb pressure in socket.  You are trying to feel with limb what you used to feel with the bottom of your foot.  Side to Side Shift: Moving your hips only (not shoulders): move weight onto your left leg, HOLD/FEEL pressure in socket.  Move back to equal weight on each leg, HOLD/FEEL pressure in socket. Move weight onto your right leg, HOLD/FEEL pressure in socket. Move back to equal weight on each leg, HOLD/FEEL pressure in socket. Repeat.  Start with both hands on sink, progress to hand on prosthetic side only, then no hands.  Front to Back Shift: Moving your hips only (not shoulders): move your weight forward onto your toes, HOLD/FEEL pressure in socket. Move your weight back to equal Flat Foot on both legs, HOLD/FEEL  pressure in socket. Move your weight back onto your heels, HOLD/FEEL  pressure in socket. Move your weight back to equal on both legs, HOLD/FEEL  pressure in socket. Repeat.  Start with both hands on sink, progress to hand on prosthetic side only, then no hands.  Moving Cones / Cups: With equal weight on each leg: Hold on with one hand the first time, then progress to no hand supports. Move cups from one side of sink to the other. Place cups ~2" out of your reach, progress to 10" beyond reach.  Place one hand in middle of sink and reach with other  hand. Do both arms.  Then hover one hand and move cups with other hand.  Overhead/Upward Reaching: alternated reaching up to top cabinets or ceiling if no cabinets present. Keep equal weight on each leg. Start with one hand support on counter while other hand reaches and progress to no hand support with reaching.  ace one hand in middle of sink and reach with other hand. Do both arms.  Then hover one hand and move cups with other hand.  5.   Looking Over Shoulders: With equal weight on each leg: alternate turning to look over your shoulders with one hand support on counter as needed.  Start with head motions only to look in front of shoulder, then even with shoulder and progress to looking behind you. To look to side, move head /eyes, then shoulder on side looking pulls back, shift more weight to side looking and pull hip back. Place one hand in middle of sink and let go with other hand so your shoulder can pull back. Switch hands to look other way.   Then hover one hand and look over shoulder. If looking right, use left hand at sink. If looking left, use right hand at sink. 6.  Stepping with leg that is not amputated:  Move items under cabinet out of your way. Shift your hips/pelvis so weight on prosthesis. Tighten muscles in hip on prosthetic side.  SLOWLY step  other leg so front of foot is in cabinet. Then step back to floor.

## 2021-05-04 ENCOUNTER — Encounter: Payer: Medicare HMO | Admitting: Physical Therapy

## 2021-05-07 ENCOUNTER — Encounter: Payer: Medicare HMO | Admitting: Physical Therapy

## 2021-05-28 ENCOUNTER — Ambulatory Visit: Payer: Medicare HMO | Admitting: Orthopedic Surgery

## 2021-06-10 ENCOUNTER — Encounter: Payer: Self-pay | Admitting: Orthopedic Surgery

## 2021-06-10 NOTE — Progress Notes (Signed)
Office Visit Note   Patient: Troy [name]           Date of Birth: [birth year]           MRN: [id number] Visit Date: 04/30/2021              Requested by: No referring provider defined for this encounter. PCP: Pcp, No  Chief Complaint  Patient presents with   Right Foot - Wound Check      HPI: Patient is a 60 year old gentleman who presents for evaluation for 2 separate issues.  #1 he is just over 3 months out from the left above-the-knee amputation.  Patient also presents with a chronic ulcer beneath the first metatarsal head right foot.  Assessment & Plan: Visit Diagnoses:  1. S/P AKA (above knee amputation), left ([place])   2. Right foot ulcer, limited to breakdown of skin ([place])     Plan: Ulcer was debrided on the right foot.  Patient will continue working with Shirlean Mylar for physical therapy.  Follow-Up Instructions: Return in about 4 weeks (around 05/28/2021).   Ortho Exam  Patient is alert, oriented, no adenopathy, well-dressed, normal affect, normal respiratory effort. Examination he has a well-healed left above-the-knee amputation no ulcers no drainage.  He does have an ulcer beneath the first metatarsal head of the right foot.  After informed consent a 10 blade knife was used to debride the skin and soft tissue back to healthy viable granulation tissue this was touched with silver nitrate.  There is no exposed bone or tendon the ulcer is 25 mm in diameter and 2 mm deep after debridement.  Prior to debridement the ulcer was 10 mm in diameter.  Imaging: No results found. No images are attached to the encounter.  Labs: Lab Results  Component Value Date   HGBA1C 12.3 (H) 01/21/2021   HGBA1C 11.0 (H) 12/13/2017   REPTSTATUS 02/13/2021 FINAL 02/11/2021   GRAMSTAIN  08/22/2020    FEW WBC PRESENT,BOTH PMN AND MONONUCLEAR ABUNDANT GRAM POSITIVE COCCI IN PAIRS    CULT  02/11/2021    NO GROWTH Performed at [facility], [place] [address], [place], [place] [address]     [place] OXYTOCA 08/22/2020     Lab Results  Component Value Date   ALBUMIN 3.3 (L) 02/11/2021   ALBUMIN 3.5 08/22/2020   ALBUMIN 3.5 07/25/2020    Lab Results  Component Value Date   MG 1.4 (L) 10/22/2018   MG 1.9 08/14/2008   MG 2.2 08/11/2008   No results found for: VD25OH  No results found for: PREALBUMIN CBC EXTENDED Latest Ref Rng & Units 02/13/2021 02/12/2021 02/11/2021  WBC 4.0 - 10.5 K/uL 9.7 14.2(H) 17.6(H)  RBC 4.22 - 5.81 MIL/uL 4.48 4.25 4.79  HGB 13.0 - 17.0 g/dL 12.6(L) 12.1(L) 14.1  HCT 39.0 - 52.0 % 39.0 37.0(L) 42.5  PLT 150 - 400 K/uL 166 183 267  NEUTROABS 1.7 - 7.7 K/uL - - 13.1(H)  LYMPHSABS 0.7 - 4.0 K/uL - - 2.9     There is no height or weight on file to calculate BMI.  Orders:  No orders of the defined types were placed in this encounter.  No orders of the defined types were placed in this encounter.    Procedures: No procedures performed  Clinical Data: No additional findings.  ROS:  All other systems negative, except as noted in the HPI. Review of Systems  Objective: Vital Signs: There were no vitals taken for this visit.  Specialty Comments:  No specialty comments available.  PMFS History: Patient Active Problem List   Diagnosis Date Noted   S/P AKA (above knee amputation), left ([place]) 02/12/2021   Amputation of right great toe and 2nd Toe 02/12/2021   Sepsis due to left-sided neck cellulitis/MRSA 02/12/2021   Hypotensive episode    Elevated troponin    SIRS (systemic inflammatory response syndrome) ([place]) 02/11/2021   Abscess of bursa of left ankle 01/21/2021   Dehiscence of amputation stump (HCC)    Ischemia of left BKA site ([place])    Abscess of leg without foot, left    Osteomyelitis of second toe of right foot ([place]) 07/12/2019   Below knee amputation status, left 01/25/2018   Wound dehiscence, surgical    Status post left foot surgery 12/15/2017   GERD (gastroesophageal reflux disease) 09/26/2017    Hyperlipidemia associated with type 2 diabetes mellitus ([place]) 07/08/2017   Snoring 06/07/2016   Chest pain 01/22/2016   Abnormal nuclear stress test 01/22/2016   Pain, chronic due to trauma 07/04/2012   Complex regional pain syndrome I of lower limb 07/04/2012   COPD (chronic obstructive pulmonary disease) ([place]) 01/27/2011   Pre-operative cardiovascular examination 01/27/2011   Nonspecific abnormal electrocardiogram (ECG) (EKG) 01/27/2011   Murmur 01/27/2011   DM2 (diabetes mellitus, type 2) ([place])    HTN (hypertension)    HLD (hyperlipidemia)    Tobacco abuse    Past Medical History:  Diagnosis Date   Acute renal failure ([place])    in setting of NSAID use and orthopedic surgery 2010   Anxiety and depression    Chronic diastolic CHF (congestive heart failure) ([place])    a. Echo 6/17: severe conc LVH, vigorous EF, EF 65-70%, no dynamic obstruction, no RWMA, Gr 1 DD, mild TR  //  b. LHC 8/17: no sig CAD, LVEDP 28   COPD (chronic obstructive pulmonary disease) (HCC)    Diabetic ulcer of left foot (HCC)    DM2 (diabetes mellitus, type 2) ([place])    Dysrhythmia    Family history of early CAD    Fatty liver    GERD (gastroesophageal reflux disease)    History of amputation of foot (HCC)    L trans-met // R toe   History of cardiac catheterization    a. Reading 2002: irregs  //  b. LHC in 8/17: no sig CAD, apical DK, hyperdynamic LV, LVEDP 28   History of kidney stones    History of nuclear stress test    a. Nuc 7/17: Overall, intermediate risk nuclear stress test secondary to small size of apical lateral defect and reduced ejection fraction.  EF 43%   HLD (hyperlipidemia)    HTN (hypertension)    Hx of BKA, left ([place]) 01/03/2018   Injuries     crushing injury to both his feet in February 2010.    Kidney calculi    Palpitations    PTSD (post-traumatic stress disorder)    Tobacco abuse     Family History  Problem Relation Age of Onset   Leukemia Mother 56       died   Lung cancer Father  27       died   Heart attack Brother 23   Heart attack Brother 76   Hypertension Brother        X3   Hypertension Sister        X2   Diabetes Sister    Stroke Sister    Diabetes Sister    Other  Brother        Car accident    Past Surgical History:  Procedure Laterality Date   AMPUTATION Left 01/03/2018   Procedure: LEFT MIDFOOT AMPUTATION/REVISION MIDAMPUTAION;  Surgeon: Mcarthur Rossetti, MD;  Location: [facility];  Service: Orthopedics;  Laterality: Left;   AMPUTATION Left 01/25/2018   Procedure: LEFT BELOW KNEE AMPUTATION;  Surgeon: [name], MD;  Location: [facility];  Service: Orthopedics;  Laterality: Left;   AMPUTATION Left 01/21/2021   Procedure: LEFT ABOVE KNEE AMPUTATION;  Surgeon: [name], MD;  Location: [facility];  Service: Orthopedics;  Laterality: Left;   AMPUTATION TOE Right 07/17/2019   Procedure: AMPUTATION RIGHT FOOT 2ND TOE;  Surgeon: Mcarthur Rossetti, MD;  Location: [facility];  Service: Orthopedics;  Laterality: Right;   APPLICATION OF WOUND VAC Left 01/21/2021   Procedure: APPLICATION OF WOUND VAC;  Surgeon: [name], MD;  Location: [facility];  Service: Orthopedics;  Laterality: Left;   BELOW KNEE LEG AMPUTATION Left 01/25/2018   CARDIAC CATHETERIZATION N/A 01/22/2016   Procedure: Left Heart Cath and Coronary Angiography;  Surgeon: Peter M Martinique, MD;  Location: [facility];  Service: Cardiovascular;  Laterality: N/A;   FOOT AMPUTATION Bilateral    I & D EXTREMITY Left 12/15/2017   Procedure: IRRIGATION AND DEBRIDEMENT LEFT FOOT ULCER;  Surgeon: Mcarthur Rossetti, MD;  Location: [facility];  Service: Orthopedics;  Laterality: Left;   I & D EXTREMITY Left 07/25/2020   Procedure: LEFT BELOW KNEE AMPUTATION ABSCESS EXCISION AND SKIN GRAFT;  Surgeon: [name], MD;  Location: [facility];  Service: Orthopedics;  Laterality: Left;   I & D EXTREMITY Left 08/22/2020   Procedure: DEBRIDEMENT LEFT BELOW KNEE AMPUTATION AND APPLY KERECIS SKIN  GRAFT;  Surgeon: [name], MD;  Location: [facility];  Service: Orthopedics;  Laterality: Left;   LITHOTRIPSY     TENDON LENGTHENING Bilateral    calf   TONSILLECTOMY     Social History   Occupational History   Occupation: DISABLED  Tobacco Use   Smoking status: Every Day    Packs/day: 0.75    Years: 40.00    Pack years: 30.00    Types: Cigarettes   Smokeless tobacco: Never  Vaping Use   Vaping Use: Never used  Substance and Sexual Activity   Alcohol use: No   Drug use: No   Sexual activity: Not on file

## 2021-08-13 ENCOUNTER — Emergency Department (HOSPITAL_COMMUNITY)
Admission: EM | Admit: 2021-08-13 | Discharge: 2021-08-13 | Disposition: A | Discharge status: Home / Self Care | Payer: Medicare HMO | Source: Home / Self Care | Attending: Emergency Medicine | Admitting: Emergency Medicine

## 2021-08-13 ENCOUNTER — Other Ambulatory Visit: Payer: Self-pay

## 2021-08-13 ENCOUNTER — Encounter (HOSPITAL_COMMUNITY): Payer: Self-pay | Admitting: Emergency Medicine

## 2021-08-13 ENCOUNTER — Emergency Department (HOSPITAL_COMMUNITY): Payer: Medicare HMO

## 2021-08-13 DIAGNOSIS — I110 Hypertensive heart disease with heart failure: Secondary | ICD-10-CM | POA: Insufficient documentation

## 2021-08-13 DIAGNOSIS — M549 Dorsalgia, unspecified: Secondary | ICD-10-CM | POA: Insufficient documentation

## 2021-08-13 DIAGNOSIS — E1111 Type 2 diabetes mellitus with ketoacidosis with coma: Secondary | ICD-10-CM | POA: Diagnosis not present

## 2021-08-13 DIAGNOSIS — E876 Hypokalemia: Secondary | ICD-10-CM | POA: Insufficient documentation

## 2021-08-13 DIAGNOSIS — E1165 Type 2 diabetes mellitus with hyperglycemia: Secondary | ICD-10-CM | POA: Insufficient documentation

## 2021-08-13 DIAGNOSIS — R935 Abnormal findings on diagnostic imaging of other abdominal regions, including retroperitoneum: Secondary | ICD-10-CM | POA: Insufficient documentation

## 2021-08-13 DIAGNOSIS — E860 Dehydration: Secondary | ICD-10-CM | POA: Insufficient documentation

## 2021-08-13 DIAGNOSIS — J449 Chronic obstructive pulmonary disease, unspecified: Secondary | ICD-10-CM | POA: Insufficient documentation

## 2021-08-13 DIAGNOSIS — Z7982 Long term (current) use of aspirin: Secondary | ICD-10-CM | POA: Insufficient documentation

## 2021-08-13 DIAGNOSIS — U071 COVID-19: Secondary | ICD-10-CM | POA: Insufficient documentation

## 2021-08-13 DIAGNOSIS — E889 Metabolic disorder, unspecified: Secondary | ICD-10-CM | POA: Insufficient documentation

## 2021-08-13 DIAGNOSIS — E871 Hypo-osmolality and hyponatremia: Secondary | ICD-10-CM | POA: Insufficient documentation

## 2021-08-13 DIAGNOSIS — Z79899 Other long term (current) drug therapy: Secondary | ICD-10-CM | POA: Insufficient documentation

## 2021-08-13 DIAGNOSIS — Z7984 Long term (current) use of oral hypoglycemic drugs: Secondary | ICD-10-CM | POA: Insufficient documentation

## 2021-08-13 DIAGNOSIS — Z794 Long term (current) use of insulin: Secondary | ICD-10-CM | POA: Insufficient documentation

## 2021-08-13 DIAGNOSIS — I5032 Chronic diastolic (congestive) heart failure: Secondary | ICD-10-CM | POA: Insufficient documentation

## 2021-08-13 DIAGNOSIS — R197 Diarrhea, unspecified: Secondary | ICD-10-CM

## 2021-08-13 DIAGNOSIS — R112 Nausea with vomiting, unspecified: Secondary | ICD-10-CM

## 2021-08-13 LAB — CBC WITH DIFFERENTIAL/PLATELET
Abs Immature Granulocytes: 0.06 10*3/uL (ref 0.00–0.07)
Basophils Absolute: 0 10*3/uL (ref 0.0–0.1)
Basophils Relative: 0 %
Eosinophils Absolute: 0 10*3/uL (ref 0.0–0.5)
Eosinophils Relative: 0 %
HCT: 45.5 % (ref 39.0–52.0)
Hemoglobin: 16.4 g/dL (ref 13.0–17.0)
Immature Granulocytes: 1 %
Lymphocytes Relative: 13 %
Lymphs Abs: 1.5 10*3/uL (ref 0.7–4.0)
MCH: 29.2 pg (ref 26.0–34.0)
MCHC: 36 g/dL (ref 30.0–36.0)
MCV: 81.1 fL (ref 80.0–100.0)
Monocytes Absolute: 0.8 10*3/uL (ref 0.1–1.0)
Monocytes Relative: 7 %
Neutro Abs: 8.6 10*3/uL — ABNORMAL HIGH (ref 1.7–7.7)
Neutrophils Relative %: 79 %
Platelets: 128 10*3/uL — ABNORMAL LOW (ref 150–400)
RBC: 5.61 MIL/uL (ref 4.22–5.81)
RDW: 12.3 % (ref 11.5–15.5)
WBC: 10.9 10*3/uL — ABNORMAL HIGH (ref 4.0–10.5)
nRBC: 0 % (ref 0.0–0.2)

## 2021-08-13 LAB — COMPREHENSIVE METABOLIC PANEL
ALT: 25 U/L (ref 0–44)
AST: 24 U/L (ref 15–41)
Albumin: 3.6 g/dL (ref 3.5–5.0)
Alkaline Phosphatase: 88 U/L (ref 38–126)
Anion gap: 14 (ref 5–15)
BUN: 5 mg/dL — ABNORMAL LOW (ref 8–23)
CO2: 23 mmol/L (ref 22–32)
Calcium: 8.3 mg/dL — ABNORMAL LOW (ref 8.9–10.3)
Chloride: 93 mmol/L — ABNORMAL LOW (ref 98–111)
Creatinine, Ser: 0.6 mg/dL — ABNORMAL LOW (ref 0.61–1.24)
GFR, Estimated: >60 mL/min (ref >60)
Glucose, Bld: 376 mg/dL — ABNORMAL HIGH (ref 70–99)
Potassium: 2.8 mmol/L — ABNORMAL LOW (ref 3.5–5.1)
Sodium: 130 mmol/L — ABNORMAL LOW (ref 135–145)
Total Bilirubin: 0.9 mg/dL (ref 0.3–1.2)
Total Protein: 6.9 g/dL (ref 6.5–8.1)

## 2021-08-13 LAB — URINALYSIS, ROUTINE W REFLEX MICROSCOPIC
Bacteria, UA: NONE SEEN
Bilirubin Urine: NEGATIVE
Glucose, UA: >=500 mg/dL — AB
Hgb urine dipstick: NEGATIVE
Ketones, ur: 20 mg/dL — AB
Leukocytes,Ua: NEGATIVE
Nitrite: NEGATIVE
Protein, ur: 100 mg/dL — AB
Specific Gravity, Urine: 1.039 — ABNORMAL HIGH (ref 1.005–1.030)
pH: 7 (ref 5.0–8.0)

## 2021-08-13 LAB — LIPASE, BLOOD: Lipase: 33 U/L (ref 11–51)

## 2021-08-13 LAB — RESP PANEL BY RT-PCR (FLU A&B, COVID) ARPGX2
Influenza A by PCR: NEGATIVE
Influenza B by PCR: NEGATIVE
SARS Coronavirus 2 by RT PCR: POSITIVE — AB

## 2021-08-13 MED ORDER — OXYCODONE-ACETAMINOPHEN 5-325 MG PO TABS
1.0000 | ORAL_TABLET | Freq: Once | ORAL | Status: AC
  Administered 2021-08-13: 1 via ORAL
  Filled 2021-08-13: qty 1

## 2021-08-13 MED ORDER — SODIUM CHLORIDE 0.9 % IV BOLUS
1000.0000 mL | Freq: Once | INTRAVENOUS | Status: AC
  Administered 2021-08-13: 1000 mL via INTRAVENOUS

## 2021-08-13 MED ORDER — POTASSIUM CHLORIDE CRYS ER 20 MEQ PO TBCR: 40.0000 meq | EXTENDED_RELEASE_TABLET | Freq: Every day | ORAL | 0 refills | Status: DC

## 2021-08-13 MED ORDER — IOHEXOL 300 MG/ML  SOLN
100.0000 mL | Freq: Once | INTRAMUSCULAR | Status: AC | PRN
  Administered 2021-08-13: 100 mL via INTRAVENOUS

## 2021-08-13 MED ORDER — ONDANSETRON 4 MG PO TBDP: 4.0000 mg | ORAL_TABLET | Freq: Three times a day (TID) | ORAL | 0 refills | Status: DC | PRN

## 2021-08-13 MED ORDER — POTASSIUM CHLORIDE CRYS ER 20 MEQ PO TBCR
40.0000 meq | EXTENDED_RELEASE_TABLET | Freq: Once | ORAL | Status: AC
  Administered 2021-08-13: 40 meq via ORAL
  Filled 2021-08-13: qty 2

## 2021-08-13 MED ORDER — ONDANSETRON HCL 4 MG/2ML IJ SOLN
4.0000 mg | Freq: Once | INTRAMUSCULAR | Status: AC
  Administered 2021-08-13: 4 mg via INTRAVENOUS
  Filled 2021-08-13: qty 2

## 2021-08-13 MED ORDER — POTASSIUM CHLORIDE 10 MEQ/100ML IV SOLN
10.0000 meq | INTRAVENOUS | Status: AC
  Administered 2021-08-13: 10 meq via INTRAVENOUS
  Filled 2021-08-13: qty 100

## 2021-08-13 NOTE — ED Notes (Signed)
Pt ate and drank PO and tolerated well.

## 2021-08-13 NOTE — ED Provider Notes (Signed)
[facility] EMERGENCY DEPARTMENT Provider Note   CSN: [id number] Arrival date & time: 08/13/21  0148     History  Chief Complaint  Patient presents with   Emesis/Diarrhea w/Back pain     Troy [name] is a 61 y.o. male.  HPI     This is a 61 year old male with a history of diabetes, hypertension, hyperlipidemia, left AKA who presents with nausea, vomiting, diarrhea.  Patient states that he has been sick for the last 24 to 48 hours.  He states he has had minimal oral intake over the last 24 hours.  He has had multiple episodes of nonbilious, nonbloody emesis and nonbloody diarrhea.  No fevers.  No known sick contacts.  Reports bilateral back pain and discomfort.  Denies any abdominal pain, chest pain, shortness of breath.  He has not taken anything for his symptoms.  Home Medications Prior to Admission medications   Medication Sig Start Date End Date Taking? Authorizing Provider  acetaminophen (TYLENOL) 325 MG tablet Take 2 tablets (650 mg total) by mouth every 6 (six) hours as needed for mild pain (or Fever >/= 101). 02/13/21   Emokpae, Courage, MD  albuterol (VENTOLIN HFA) 108 (90 Base) MCG/ACT inhaler Inhale 2 puffs into the lungs every 4 (four) hours as needed for wheezing or shortness of breath. 02/13/21   Denton Brick, Courage, MD  amitriptyline (ELAVIL) 150 MG tablet Take 150 mg by mouth at bedtime.    [provider]  amLODipine (NORVASC) 10 MG tablet Take 1 tablet (10 mg total) by mouth daily. 02/13/21 02/13/22  Roxan Hockey, MD  ascorbic acid (VITAMIN C) 1000 MG tablet Take 1 tablet (1,000 mg total) by mouth daily. 01/26/21   Persons, Bevely Palmer, [place]  aspirin EC 81 MG EC tablet Take 1 tablet (81 mg total) by mouth daily with breakfast. Swallow whole. 02/13/21   Roxan Hockey, MD  Dapagliflozin-metFORMIN HCl ER 10-998 MG TB24 Take 1 tablet by mouth daily.    [provider]  Dulaglutide 1.5 MG/0.5ML SOPN Inject 1.5 mg into the skin every Sunday.  08/23/18   [provider]  gabapentin (NEURONTIN) 300 MG capsule Take 300 mg by mouth 4 (four) times daily. 12/04/20   [provider]  glucose blood (ONETOUCH VERIO) test strip USE TO CHECK BLOOD SUGAR 1-2 TIME(S) DAILY. 06/07/19   [provider]  Insulin Degludec (TRESIBA) 100 UNIT/ML SOLN Inject 24 Units into the skin in the morning and at bedtime.    [provider]  Insulin Pen Needle (PEN NEEDLES) 32G X 4 MM MISC by Does not apply route. 12/18/19   [provider]  ipratropium (ATROVENT) 0.02 % nebulizer solution Take 2.5 mLs (0.5 mg total) by nebulization 4 (four) times daily as needed for wheezing or shortness of breath. 02/13/21   Roxan Hockey, MD  mupirocin ointment (BACTROBAN) 2 % Place into the nose 2 (two) times daily. 02/13/21   Roxan Hockey, MD  naloxone [facility]) nasal spray 4 mg/0.1 mL Place 1 spray into the nose once. 05/30/20   [provider]  nicotine (NICODERM CQ - DOSED IN MG/24 HOURS) 14 mg/24hr patch Place 1 patch (14 mg total) onto the skin daily. 02/13/21 02/13/22  Roxan Hockey, MD  omeprazole (PRILOSEC) 40 MG capsule Take 40 mg by mouth daily.    [provider]  ondansetron (ZOFRAN-ODT) 4 MG disintegrating tablet Take 1 tablet (4 mg total) by mouth every 8 (eight) hours as needed for nausea or vomiting. 08/13/21  Yes  Paraskevi Funez, Barbette Hair, MD  oxyCODONE-acetaminophen (PERCOCET) 10-325 MG tablet Take 1 tablet by mouth every 4 (four) hours as needed for pain. 01/26/21   Persons, Bevely Palmer, PA  polyethylene glycol (MIRALAX / GLYCOLAX) 17 g packet Take 17 g by mouth daily as needed for mild constipation. 02/13/21   Roxan Hockey, MD  potassium chloride SA (KLOR-CON M) 20 MEQ tablet Take 2 tablets (40 mEq total) by mouth daily. 08/13/21  Yes Terel Bann, Barbette Hair, MD  tiZANidine (ZANAFLEX) 4 MG tablet Take 1 tablet (4 mg total) by mouth at bedtime. 02/13/21   Roxan Hockey, MD  traZODone (DESYREL) 50 MG tablet Take  50-100 mg by mouth at bedtime. 07/31/20   [provider]      Allergies    Hydromorphone hcl er, Tapentadol, and Exalamide    Review of Systems   Review of Systems  Constitutional:  Negative for fever.  Respiratory:  Negative for shortness of breath.   Cardiovascular:  Negative for chest pain.  Gastrointestinal:  Positive for diarrhea, nausea and vomiting.  All other systems reviewed and are negative.  Physical Exam Updated Vital Signs BP (!) 149/75    Pulse 90    Temp 98.5 F (36.9 C) (Oral)    Resp 18    SpO2 96%  Physical Exam Vitals and nursing note reviewed.  Constitutional:      Appearance: He is well-developed.     Comments: Chronically ill-appearing but nontoxic  HENT:     Head: Normocephalic and atraumatic.     Nose: Nose normal.     Mouth/Throat:     Mouth: Mucous membranes are dry.  Eyes:     Pupils: Pupils are equal, round, and reactive to light.  Cardiovascular:     Rate and Rhythm: Normal rate and regular rhythm.     Heart sounds: Normal heart sounds. No murmur heard. Pulmonary:     Effort: Pulmonary effort is normal. No respiratory distress.     Breath sounds: Normal breath sounds. No wheezing.  Abdominal:     General: Bowel sounds are normal.     Palpations: Abdomen is soft.     Tenderness: There is no abdominal tenderness. There is no right CVA tenderness, left CVA tenderness, guarding or rebound.  Musculoskeletal:     Cervical back: Neck supple.     Comments: Left AKA  Lymphadenopathy:     Cervical: No cervical adenopathy.  Skin:    General: Skin is warm and dry.  Neurological:     Mental Status: He is alert and oriented to person, place, and time.  Psychiatric:        Mood and Affect: Mood normal.    ED Results / Procedures / Treatments   Labs (all labs ordered are listed, but only abnormal results are displayed) Labs Reviewed  CBC WITH DIFFERENTIAL/PLATELET - Abnormal; Notable for the following components:      Result Value   WBC  10.9 (*)    Platelets 128 (*)    Neutro Abs 8.6 (*)    All other components within normal limits  COMPREHENSIVE METABOLIC PANEL - Abnormal; Notable for the following components:   Sodium 130 (*)    Potassium 2.8 (*)    Chloride 93 (*)    Glucose, Bld 376 (*)    BUN 5 (*)    Creatinine, Ser 0.60 (*)    Calcium 8.3 (*)    All other components within normal limits  URINALYSIS, ROUTINE W REFLEX MICROSCOPIC - Abnormal; Notable for  the following components:   Specific Gravity, Urine 1.039 (*)    Glucose, UA >=500 (*)    Ketones, ur 20 (*)    Protein, ur 100 (*)    All other components within normal limits  LIPASE, BLOOD    EKG EKG Interpretation  Date/Time:  Thursday August 13 2021 04:39:32 EST Ventricular Rate:  96 PR Interval:  156 QRS Duration: 83 QT Interval:  400 QTC Calculation: 506 R Axis:   79 Text Interpretation: Sinus rhythm Atrial premature complex Abnormal R-wave progression, early transition Abnormal T, consider ischemia, diffuse leads Prolonged QT interval T wave inversions more pronounced Confirmed by Thayer Jew [phone]) on 08/13/2021 4:44:56 AM  Radiology CT ABDOMEN PELVIS W CONTRAST  Result Date: 08/13/2021 CLINICAL DATA:  61 year old male with history of nausea and vomiting. EXAM: CT ABDOMEN AND PELVIS WITH CONTRAST TECHNIQUE: Multidetector CT imaging of the abdomen and pelvis was performed using the standard protocol following bolus administration of intravenous contrast. RADIATION DOSE REDUCTION: This exam was performed according to the departmental dose-optimization program which includes automated exposure control, adjustment of the mA and/or kV according to patient size and/or use of iterative reconstruction technique. CONTRAST:  170m OMNIPAQUE IOHEXOL 300 MG/ML  SOLN COMPARISON:  CT of the abdomen and pelvis 11/18/2018. FINDINGS: Lower chest: Patchy peribronchovascular airspace consolidation noted in the anterior aspect of the left lower lobe. Concentric  left ventricular hypertrophy. Hepatobiliary: Calcified granuloma in the liver. No other suspicious hepatic lesions. Liver has a shrunken appearance and nodular contour, indicative of underlying cirrhosis. No intra or extrahepatic biliary ductal dilatation. Gallbladder is normal in appearance. Pancreas: No pancreatic mass. No pancreatic ductal dilatation. No pancreatic or peripancreatic fluid collections or inflammatory changes. Spleen: Unremarkable. Adrenals/Urinary Tract: Subcentimeter low-attenuation lesion in the interpolar region of the left kidney, too small to characterize, but statistically likely to represent a cyst. Right kidney and bilateral adrenal glands are normal in appearance. No hydroureteronephrosis. Urinary bladder is normal in appearance. Stomach/Bowel: Normal appearance of the stomach. No pathologic dilatation of small bowel or colon. Normal appendix. Vascular/Lymphatic: No significant atherosclerotic disease, aneurysm or dissection noted in the abdominal or pelvic vasculature. No lymphadenopathy noted in the abdomen or pelvis. Reproductive: Prostate gland and seminal vesicles are unremarkable in appearance. Other: No significant volume of ascites.  No pneumoperitoneum. Musculoskeletal: There are no aggressive appearing lytic or blastic lesions noted in the visualized portions of the skeleton. IMPRESSION: 1. No acute findings are noted in the abdomen or pelvis to account for the patient's symptoms. 2. Some peribronchovascular airspace consolidation is noted in the anterior aspect of the left lower lobe, concerning for developing bronchopneumonia. 3. Morphologic changes in the liver indicative of underlying cirrhosis. 4. Aortic atherosclerosis. 5. Concentric left ventricular hypertrophy. Electronically Signed   By: DVinnie LangtonM.D.   On: 08/13/2021 05:42    Procedures .Critical Care Performed by: HMerryl Hacker MD Authorized by: HMerryl Hacker MD   Critical care provider  statement:    Critical care time (minutes):  35   Critical care was necessary to treat or prevent imminent or life-threatening deterioration of the following conditions:  Dehydration and metabolic crisis   Critical care was time spent personally by me on the following activities:  Development of treatment plan with patient or surrogate, discussions with consultants, evaluation of patient's response to treatment, examination of patient, ordering and review of laboratory studies, ordering and review of radiographic studies, ordering and performing treatments and interventions, pulse oximetry, re-evaluation of patient's condition and review  of old charts    Medications Ordered in ED Medications  potassium chloride 10 mEq in 100 mL IVPB (0 mEq Intravenous Stopped 08/13/21 0612)  oxyCODONE-acetaminophen (PERCOCET/ROXICET) 5-325 MG per tablet 1 tablet (has no administration in time range)  sodium chloride 0.9 % bolus 1,000 mL (0 mLs Intravenous Stopped 08/13/21 0611)  ondansetron (ZOFRAN) injection 4 mg (4 mg Intravenous Given 08/13/21 0328)  potassium chloride SA (KLOR-CON M) CR tablet 40 mEq (40 mEq Oral Given 08/13/21 0419)  iohexol (OMNIPAQUE) 300 MG/ML solution 100 mL (100 mLs Intravenous Contrast Given 08/13/21 8675)    ED Course/ Medical Decision Making/ A&P                           Medical Decision Making Amount and/or Complexity of Data Reviewed Labs: ordered. Radiology: ordered.  Risk Prescription drug management.   This patient presents to the ED for concern of nausea, vomiting, diarrhea, this involves an extensive number of treatment options, and is a complaint that carries with it a high risk of complications and morbidity.  The differential diagnosis includes gastroenteritis, gastritis, obstruction, intra-abdominal pathology such as appendicitis cholecystitis, urinary tract infection  MDM:    This is a 61 year old male with history of diabetes who presents with nausea, vomiting,  diarrhea.  He is dry but nontoxic on exam.  Vital signs are largely reassuring he is afebrile.  His abdomen is nontender which makes intra-abdominal pathology less likely.  Given history, suspect viral gastroenteritis.  Will obtain some labs.  Patient was given fluids and nausea medication.  He has evidence of hypokalemia with a potassium of 2.8.  EKG shows deeper T wave inversions than prior.  No ischemic changes.  He is slightly hyponatremic and hyper glycemic.  No evidence of DKA.  No evidence of UTI.  CT scan obtained and shows no evidence of intra-abdominal pathology.  Questionable pneumonia.  Patient denies any fevers or cough.  Clinically low suspicion for pneumonia.  Will swab for COVID.  They will check MyChart for results.  Will discharge with potassium and Zofran.  Patient is able to orally hydrate at discharge. (Labs, imaging)  Labs: I Ordered, and personally interpreted labs.  The pertinent results include: Hypokalemia to 2.8, hyperglycemia without evidence of DKA  Imaging Studies ordered: I ordered imaging studies including CT scan I independently visualized and interpreted imaging. I agree with the radiologist interpretation  Additional history obtained from chart review and wife.  External records from outside source obtained and reviewed including prior evaluations  Critical Interventions: IV potassium  Consultations: I requested consultation with the none,  and discussed lab and imaging findings as well as pertinent plan - they recommend: None  Cardiac Monitoring: The patient was maintained on a cardiac monitor.  I personally viewed and interpreted the cardiac monitored which showed an underlying rhythm of: Normal sinus rhythm  Reevaluation: After the interventions noted above, I reevaluated the patient and found that they have : Improved   Considered admission for: Dehydration, metabolic derangement  Social Determinants of Health: Lives independently with  wife  Disposition: Discharge  Co morbidities that complicate the patient evaluation  Past Medical History:  Diagnosis Date   Acute renal failure ([place])    in setting of NSAID use and orthopedic surgery 2010   Anxiety and depression    Chronic diastolic CHF (congestive heart failure) ([place])    a. Echo 6/17: severe conc LVH, vigorous EF, EF 65-70%, no dynamic obstruction, no RWMA,  Gr 1 DD, mild TR  //  b. 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 [place] 2002: irregs  //  b. LHC in 8/17: no sig CAD, apical DK, hyperdynamic LV, LVEDP 28   History of kidney stones    History of nuclear stress test    a. Nuc 7/17: Overall, intermediate risk nuclear stress test secondary to small size of apical lateral defect and reduced ejection fraction.  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    Kidney calculi    Palpitations    PTSD (post-traumatic stress disorder)    Tobacco abuse      Medicines Meds ordered this encounter  Medications   sodium chloride 0.9 % bolus 1,000 mL   ondansetron (ZOFRAN) injection 4 mg   potassium chloride 10 mEq in 100 mL IVPB   potassium chloride SA (KLOR-CON M) CR tablet 40 mEq   iohexol (OMNIPAQUE) 300 MG/ML solution 100 mL   oxyCODONE-acetaminophen (PERCOCET/ROXICET) 5-325 MG per tablet 1 tablet   potassium chloride SA (KLOR-CON M) 20 MEQ tablet    Sig: Take 2 tablets (40 mEq total) by mouth daily.    Dispense:  5 tablet    Refill:  0   ondansetron (ZOFRAN-ODT) 4 MG disintegrating tablet    Sig: Take 1 tablet (4 mg total)  by mouth every 8 (eight) hours as needed for nausea or vomiting.    Dispense:  20 tablet    Refill:  0    I have reviewed the patients home medicines and have made adjustments as needed  Problem List / ED Course: Problem List Items Addressed This Visit   None Visit Diagnoses     Nausea vomiting and diarrhea    -  Primary   Hypokalemia                       Final Clinical Impression(s) / ED Diagnoses Final diagnoses:  Nausea vomiting and diarrhea  Hypokalemia    Rx / DC Orders ED Discharge Orders          Ordered    potassium chloride SA (KLOR-CON M) 20 MEQ tablet  Daily        08/13/21 0617    ondansetron (ZOFRAN-ODT) 4 MG disintegrating tablet  Every 8 hours PRN        08/13/21 0617              Merryl Hacker, MD 08/13/21 [phone]

## 2021-08-13 NOTE — ED Triage Notes (Signed)
Patient reports emesis with diarrhea and low back pain onset yesterday , no fever or chills .

## 2021-08-13 NOTE — Discharge Instructions (Signed)
You were seen today for nausea, vomiting, and diarrhea.  Your CT scan is largely reassuring.  You did appear dehydrated and had some low potassium.  Take potassium pills as directed.  Follow-up with your primary doctor for recheck of your potassium.

## 2021-08-15 ENCOUNTER — Encounter (HOSPITAL_COMMUNITY): Payer: Self-pay

## 2021-08-15 ENCOUNTER — Inpatient Hospital Stay (HOSPITAL_COMMUNITY)
Admission: EM | Admit: 2021-08-15 | Discharge: 2021-08-27 | DRG: 637 | Disposition: A | Discharge status: Skilled Nursing Facility | Payer: Medicare HMO | Attending: Internal Medicine | Admitting: Internal Medicine

## 2021-08-15 ENCOUNTER — Other Ambulatory Visit: Payer: Self-pay

## 2021-08-15 ENCOUNTER — Emergency Department (HOSPITAL_COMMUNITY): Payer: Medicare HMO

## 2021-08-15 DIAGNOSIS — J869 Pyothorax without fistula: Secondary | ICD-10-CM | POA: Diagnosis not present

## 2021-08-15 DIAGNOSIS — Z9689 Presence of other specified functional implants: Secondary | ICD-10-CM

## 2021-08-15 DIAGNOSIS — E871 Hypo-osmolality and hyponatremia: Secondary | ICD-10-CM | POA: Diagnosis not present

## 2021-08-15 DIAGNOSIS — I739 Peripheral vascular disease, unspecified: Secondary | ICD-10-CM | POA: Diagnosis not present

## 2021-08-15 DIAGNOSIS — E1129 Type 2 diabetes mellitus with other diabetic kidney complication: Secondary | ICD-10-CM | POA: Diagnosis not present

## 2021-08-15 DIAGNOSIS — R7881 Bacteremia: Secondary | ICD-10-CM | POA: Diagnosis not present

## 2021-08-15 DIAGNOSIS — E1110 Type 2 diabetes mellitus with ketoacidosis without coma: Secondary | ICD-10-CM | POA: Diagnosis not present

## 2021-08-15 DIAGNOSIS — J159 Unspecified bacterial pneumonia: Secondary | ICD-10-CM | POA: Diagnosis present

## 2021-08-15 DIAGNOSIS — J90 Pleural effusion, not elsewhere classified: Secondary | ICD-10-CM | POA: Diagnosis not present

## 2021-08-15 DIAGNOSIS — K219 Gastro-esophageal reflux disease without esophagitis: Secondary | ICD-10-CM | POA: Diagnosis present

## 2021-08-15 DIAGNOSIS — Z6827 Body mass index (BMI) 27.0-27.9, adult: Secondary | ICD-10-CM

## 2021-08-15 DIAGNOSIS — K7460 Unspecified cirrhosis of liver: Secondary | ICD-10-CM | POA: Diagnosis present

## 2021-08-15 DIAGNOSIS — R230 Cyanosis: Secondary | ICD-10-CM | POA: Diagnosis not present

## 2021-08-15 DIAGNOSIS — Z28310 Unvaccinated for covid-19: Secondary | ICD-10-CM

## 2021-08-15 DIAGNOSIS — Z8249 Family history of ischemic heart disease and other diseases of the circulatory system: Secondary | ICD-10-CM

## 2021-08-15 DIAGNOSIS — Z87442 Personal history of urinary calculi: Secondary | ICD-10-CM

## 2021-08-15 DIAGNOSIS — J1282 Pneumonia due to coronavirus disease 2019: Secondary | ICD-10-CM | POA: Diagnosis present

## 2021-08-15 DIAGNOSIS — E1111 Type 2 diabetes mellitus with ketoacidosis with coma: Principal | ICD-10-CM | POA: Diagnosis present

## 2021-08-15 DIAGNOSIS — J918 Pleural effusion in other conditions classified elsewhere: Secondary | ICD-10-CM | POA: Diagnosis not present

## 2021-08-15 DIAGNOSIS — F419 Anxiety disorder, unspecified: Secondary | ICD-10-CM | POA: Diagnosis present

## 2021-08-15 DIAGNOSIS — Z7984 Long term (current) use of oral hypoglycemic drugs: Secondary | ICD-10-CM

## 2021-08-15 DIAGNOSIS — Z888 Allergy status to other drugs, medicaments and biological substances status: Secondary | ICD-10-CM

## 2021-08-15 DIAGNOSIS — F32A Depression, unspecified: Secondary | ICD-10-CM | POA: Diagnosis present

## 2021-08-15 DIAGNOSIS — R069 Unspecified abnormalities of breathing: Secondary | ICD-10-CM

## 2021-08-15 DIAGNOSIS — Z89512 Acquired absence of left leg below knee: Secondary | ICD-10-CM

## 2021-08-15 DIAGNOSIS — Z89421 Acquired absence of other right toe(s): Secondary | ICD-10-CM

## 2021-08-15 DIAGNOSIS — I998 Other disorder of circulatory system: Secondary | ICD-10-CM | POA: Diagnosis not present

## 2021-08-15 DIAGNOSIS — M549 Dorsalgia, unspecified: Secondary | ICD-10-CM | POA: Diagnosis present

## 2021-08-15 DIAGNOSIS — E860 Dehydration: Secondary | ICD-10-CM | POA: Diagnosis present

## 2021-08-15 DIAGNOSIS — J189 Pneumonia, unspecified organism: Secondary | ICD-10-CM

## 2021-08-15 DIAGNOSIS — I509 Heart failure, unspecified: Secondary | ICD-10-CM | POA: Diagnosis not present

## 2021-08-15 DIAGNOSIS — I10 Essential (primary) hypertension: Secondary | ICD-10-CM | POA: Diagnosis not present

## 2021-08-15 DIAGNOSIS — Z7982 Long term (current) use of aspirin: Secondary | ICD-10-CM

## 2021-08-15 DIAGNOSIS — E669 Obesity, unspecified: Secondary | ICD-10-CM | POA: Diagnosis present

## 2021-08-15 DIAGNOSIS — Z801 Family history of malignant neoplasm of trachea, bronchus and lung: Secondary | ICD-10-CM

## 2021-08-15 DIAGNOSIS — E1122 Type 2 diabetes mellitus with diabetic chronic kidney disease: Secondary | ICD-10-CM | POA: Diagnosis present

## 2021-08-15 DIAGNOSIS — K567 Ileus, unspecified: Secondary | ICD-10-CM | POA: Diagnosis not present

## 2021-08-15 DIAGNOSIS — Z9114 Patient's other noncompliance with medication regimen: Secondary | ICD-10-CM

## 2021-08-15 DIAGNOSIS — R627 Adult failure to thrive: Secondary | ICD-10-CM | POA: Diagnosis present

## 2021-08-15 DIAGNOSIS — J188 Other pneumonia, unspecified organism: Secondary | ICD-10-CM

## 2021-08-15 DIAGNOSIS — Z885 Allergy status to narcotic agent status: Secondary | ICD-10-CM

## 2021-08-15 DIAGNOSIS — Z89612 Acquired absence of left leg above knee: Secondary | ICD-10-CM | POA: Diagnosis not present

## 2021-08-15 DIAGNOSIS — J449 Chronic obstructive pulmonary disease, unspecified: Secondary | ICD-10-CM | POA: Diagnosis not present

## 2021-08-15 DIAGNOSIS — Z89411 Acquired absence of right great toe: Secondary | ICD-10-CM

## 2021-08-15 DIAGNOSIS — E785 Hyperlipidemia, unspecified: Secondary | ICD-10-CM | POA: Diagnosis present

## 2021-08-15 DIAGNOSIS — I253 Aneurysm of heart: Secondary | ICD-10-CM | POA: Diagnosis present

## 2021-08-15 DIAGNOSIS — R239 Unspecified skin changes: Secondary | ICD-10-CM | POA: Diagnosis not present

## 2021-08-15 DIAGNOSIS — J9601 Acute respiratory failure with hypoxia: Secondary | ICD-10-CM | POA: Diagnosis present

## 2021-08-15 DIAGNOSIS — Z806 Family history of leukemia: Secondary | ICD-10-CM

## 2021-08-15 DIAGNOSIS — E876 Hypokalemia: Secondary | ICD-10-CM

## 2021-08-15 DIAGNOSIS — G546 Phantom limb syndrome with pain: Secondary | ICD-10-CM | POA: Diagnosis present

## 2021-08-15 DIAGNOSIS — J440 Chronic obstructive pulmonary disease with acute lower respiratory infection: Secondary | ICD-10-CM | POA: Diagnosis present

## 2021-08-15 DIAGNOSIS — E1310 Other specified diabetes mellitus with ketoacidosis without coma: Secondary | ICD-10-CM | POA: Diagnosis not present

## 2021-08-15 DIAGNOSIS — I110 Hypertensive heart disease with heart failure: Secondary | ICD-10-CM | POA: Diagnosis present

## 2021-08-15 DIAGNOSIS — E1140 Type 2 diabetes mellitus with diabetic neuropathy, unspecified: Secondary | ICD-10-CM | POA: Diagnosis present

## 2021-08-15 DIAGNOSIS — F4310 Post-traumatic stress disorder, unspecified: Secondary | ICD-10-CM | POA: Diagnosis present

## 2021-08-15 DIAGNOSIS — N185 Chronic kidney disease, stage 5: Secondary | ICD-10-CM | POA: Diagnosis not present

## 2021-08-15 DIAGNOSIS — Z4682 Encounter for fitting and adjustment of non-vascular catheter: Secondary | ICD-10-CM | POA: Diagnosis not present

## 2021-08-15 DIAGNOSIS — E1151 Type 2 diabetes mellitus with diabetic peripheral angiopathy without gangrene: Secondary | ICD-10-CM | POA: Diagnosis present

## 2021-08-15 DIAGNOSIS — G4700 Insomnia, unspecified: Secondary | ICD-10-CM | POA: Diagnosis present

## 2021-08-15 DIAGNOSIS — I5032 Chronic diastolic (congestive) heart failure: Secondary | ICD-10-CM | POA: Diagnosis present

## 2021-08-15 DIAGNOSIS — Z794 Long term (current) use of insulin: Secondary | ICD-10-CM

## 2021-08-15 DIAGNOSIS — Z833 Family history of diabetes mellitus: Secondary | ICD-10-CM

## 2021-08-15 DIAGNOSIS — R079 Chest pain, unspecified: Secondary | ICD-10-CM | POA: Diagnosis present

## 2021-08-15 DIAGNOSIS — J850 Gangrene and necrosis of lung: Secondary | ICD-10-CM | POA: Diagnosis present

## 2021-08-15 DIAGNOSIS — E119 Type 2 diabetes mellitus without complications: Secondary | ICD-10-CM | POA: Diagnosis not present

## 2021-08-15 DIAGNOSIS — Z148 Genetic carrier of other disease: Secondary | ICD-10-CM | POA: Diagnosis not present

## 2021-08-15 DIAGNOSIS — Z823 Family history of stroke: Secondary | ICD-10-CM

## 2021-08-15 DIAGNOSIS — F17210 Nicotine dependence, cigarettes, uncomplicated: Secondary | ICD-10-CM | POA: Diagnosis present

## 2021-08-15 DIAGNOSIS — U071 COVID-19: Secondary | ICD-10-CM | POA: Diagnosis not present

## 2021-08-15 DIAGNOSIS — Z79899 Other long term (current) drug therapy: Secondary | ICD-10-CM

## 2021-08-15 DIAGNOSIS — L7682 Other postprocedural complications of skin and subcutaneous tissue: Secondary | ICD-10-CM

## 2021-08-15 DIAGNOSIS — K7581 Nonalcoholic steatohepatitis (NASH): Secondary | ICD-10-CM | POA: Diagnosis present

## 2021-08-15 LAB — CBC WITH DIFFERENTIAL/PLATELET
Abs Immature Granulocytes: 0.52 10*3/uL — ABNORMAL HIGH (ref 0.00–0.07)
Basophils Absolute: 0.1 10*3/uL (ref 0.0–0.1)
Basophils Relative: 0 %
Eosinophils Absolute: 0.1 10*3/uL (ref 0.0–0.5)
Eosinophils Relative: 1 %
HCT: 45.2 % (ref 39.0–52.0)
Hemoglobin: 15.7 g/dL (ref 13.0–17.0)
Immature Granulocytes: 2 %
Lymphocytes Relative: 6 %
Lymphs Abs: 1.3 10*3/uL (ref 0.7–4.0)
MCH: 28.5 pg (ref 26.0–34.0)
MCHC: 34.7 g/dL (ref 30.0–36.0)
MCV: 82 fL (ref 80.0–100.0)
Monocytes Absolute: 2 10*3/uL — ABNORMAL HIGH (ref 0.1–1.0)
Monocytes Relative: 9 %
Neutro Abs: 18.4 10*3/uL — ABNORMAL HIGH (ref 1.7–7.7)
Neutrophils Relative %: 82 %
Platelets: 233 10*3/uL (ref 150–400)
RBC: 5.51 MIL/uL (ref 4.22–5.81)
RDW: 12.4 % (ref 11.5–15.5)
WBC: 22.9 10*3/uL — ABNORMAL HIGH (ref 4.0–10.5)
nRBC: 0 % (ref 0.0–0.2)

## 2021-08-15 LAB — COMPREHENSIVE METABOLIC PANEL
ALT: 14 U/L (ref 0–44)
AST: 15 U/L (ref 15–41)
Albumin: 3.1 g/dL — ABNORMAL LOW (ref 3.5–5.0)
Alkaline Phosphatase: 78 U/L (ref 38–126)
Anion gap: 20 — ABNORMAL HIGH (ref 5–15)
BUN: 11 mg/dL (ref 8–23)
CO2: 17 mmol/L — ABNORMAL LOW (ref 22–32)
Calcium: 8.1 mg/dL — ABNORMAL LOW (ref 8.9–10.3)
Chloride: 89 mmol/L — ABNORMAL LOW (ref 98–111)
Creatinine, Ser: 0.64 mg/dL (ref 0.61–1.24)
GFR, Estimated: >60 mL/min (ref >60)
Glucose, Bld: 299 mg/dL — ABNORMAL HIGH (ref 70–99)
Potassium: 2.8 mmol/L — ABNORMAL LOW (ref 3.5–5.1)
Sodium: 126 mmol/L — ABNORMAL LOW (ref 135–145)
Total Bilirubin: 1.6 mg/dL — ABNORMAL HIGH (ref 0.3–1.2)
Total Protein: 7 g/dL (ref 6.5–8.1)

## 2021-08-15 LAB — URINALYSIS, ROUTINE W REFLEX MICROSCOPIC
Bacteria, UA: NONE SEEN
Bilirubin Urine: NEGATIVE
Glucose, UA: >=500 mg/dL — AB
Ketones, ur: 80 mg/dL — AB
Leukocytes,Ua: NEGATIVE
Nitrite: NEGATIVE
Protein, ur: >=300 mg/dL — AB
Specific Gravity, Urine: 1.026 (ref 1.005–1.030)
pH: 6 (ref 5.0–8.0)

## 2021-08-15 LAB — TROPONIN I (HIGH SENSITIVITY)
Troponin I (High Sensitivity): 23 ng/L — ABNORMAL HIGH (ref <18)
Troponin I (High Sensitivity): 26 ng/L — ABNORMAL HIGH (ref <18)

## 2021-08-15 LAB — BASIC METABOLIC PANEL
Anion gap: 18 — ABNORMAL HIGH (ref 5–15)
BUN: 10 mg/dL (ref 8–23)
CO2: 17 mmol/L — ABNORMAL LOW (ref 22–32)
Calcium: 7.8 mg/dL — ABNORMAL LOW (ref 8.9–10.3)
Chloride: 94 mmol/L — ABNORMAL LOW (ref 98–111)
Creatinine, Ser: 0.66 mg/dL (ref 0.61–1.24)
GFR, Estimated: >60 mL/min (ref >60)
Glucose, Bld: 258 mg/dL — ABNORMAL HIGH (ref 70–99)
Potassium: 2.9 mmol/L — ABNORMAL LOW (ref 3.5–5.1)
Sodium: 129 mmol/L — ABNORMAL LOW (ref 135–145)

## 2021-08-15 LAB — LACTIC ACID, PLASMA
Lactic Acid, Venous: 2.2 mmol/L (ref 0.5–1.9)
Lactic Acid, Venous: 2 mmol/L (ref 0.5–1.9)

## 2021-08-15 LAB — PROCALCITONIN: Procalcitonin: <0.1 ng/mL

## 2021-08-15 LAB — BETA-HYDROXYBUTYRIC ACID: Beta-Hydroxybutyric Acid: 6.68 mmol/L — ABNORMAL HIGH (ref 0.05–0.27)

## 2021-08-15 LAB — PROTIME-INR
INR: 1.1 (ref 0.8–1.2)
Prothrombin Time: 14.4 seconds (ref 11.4–15.2)

## 2021-08-15 LAB — MAGNESIUM: Magnesium: 1.4 mg/dL — ABNORMAL LOW (ref 1.7–2.4)

## 2021-08-15 LAB — LIPASE, BLOOD: Lipase: 26 U/L (ref 11–51)

## 2021-08-15 MED ORDER — ENOXAPARIN SODIUM 40 MG/0.4ML IJ SOSY
40.0000 mg | PREFILLED_SYRINGE | INTRAMUSCULAR | Status: DC
  Administered 2021-08-16 – 2021-08-22 (×7): 40 mg via SUBCUTANEOUS
  Filled 2021-08-15 (×7): qty 0.4

## 2021-08-15 MED ORDER — OXYCODONE-ACETAMINOPHEN 10-325 MG PO TABS
1.0000 | ORAL_TABLET | ORAL | Status: DC | PRN
Start: 2021-08-15 — End: 2021-08-15

## 2021-08-15 MED ORDER — DEXTROSE 50 % IV SOLN: 0.0000 mL | INTRAVENOUS | Status: DC | PRN

## 2021-08-15 MED ORDER — ONDANSETRON HCL 4 MG PO TABS
4.0000 mg | ORAL_TABLET | Freq: Four times a day (QID) | ORAL | Status: DC | PRN
  Administered 2021-08-16: 4 mg via ORAL
  Filled 2021-08-15: qty 1

## 2021-08-15 MED ORDER — ONDANSETRON HCL 4 MG/2ML IJ SOLN
4.0000 mg | Freq: Once | INTRAMUSCULAR | Status: AC
  Administered 2021-08-15: 4 mg via INTRAVENOUS
  Filled 2021-08-15: qty 2

## 2021-08-15 MED ORDER — TIZANIDINE HCL 2 MG PO TABS
4.0000 mg | ORAL_TABLET | Freq: Every day | ORAL | Status: DC
  Administered 2021-08-16: 4 mg via ORAL
  Filled 2021-08-15: qty 1

## 2021-08-15 MED ORDER — LACTATED RINGERS IV BOLUS
1000.0000 mL | Freq: Once | INTRAVENOUS | Status: AC
Start: 2021-08-15 — End: 2021-08-15
  Administered 2021-08-15: 1000 mL via INTRAVENOUS

## 2021-08-15 MED ORDER — MAGNESIUM SULFATE 2 GM/50ML IV SOLN
2.0000 g | Freq: Once | INTRAVENOUS | Status: AC
  Administered 2021-08-15: 2 g via INTRAVENOUS
  Filled 2021-08-15: qty 50

## 2021-08-15 MED ORDER — SODIUM CHLORIDE 0.9 % IV SOLN
100.0000 mg | INTRAVENOUS | Status: AC
  Administered 2021-08-15 (×2): 100 mg via INTRAVENOUS
  Filled 2021-08-15 (×2): qty 20

## 2021-08-15 MED ORDER — LOPERAMIDE HCL 2 MG PO CAPS: 4.0000 mg | ORAL_CAPSULE | ORAL | Status: DC | PRN

## 2021-08-15 MED ORDER — LACTATED RINGERS IV SOLN: INTRAVENOUS | Status: DC

## 2021-08-15 MED ORDER — INSULIN REGULAR(HUMAN) IN NACL 100-0.9 UT/100ML-% IV SOLN
INTRAVENOUS | Status: DC
  Administered 2021-08-16: 17 [IU]/h via INTRAVENOUS
  Administered 2021-08-16: 4.6 [IU]/h via INTRAVENOUS
  Filled 2021-08-15 (×2): qty 100

## 2021-08-15 MED ORDER — ONDANSETRON HCL 4 MG/2ML IJ SOLN
4.0000 mg | Freq: Four times a day (QID) | INTRAMUSCULAR | Status: DC | PRN
  Administered 2021-08-16: 4 mg via INTRAVENOUS
  Filled 2021-08-15: qty 2

## 2021-08-15 MED ORDER — SODIUM CHLORIDE 0.9 % IV SOLN
100.0000 mg | INTRAVENOUS | Status: DC
  Filled 2021-08-15: qty 20

## 2021-08-15 MED ORDER — POTASSIUM CHLORIDE CRYS ER 20 MEQ PO TBCR
40.0000 meq | EXTENDED_RELEASE_TABLET | Freq: Two times a day (BID) | ORAL | Status: DC
  Administered 2021-08-15 – 2021-08-20 (×11): 40 meq via ORAL
  Filled 2021-08-15 (×10): qty 2

## 2021-08-15 MED ORDER — FENTANYL CITRATE PF 50 MCG/ML IJ SOSY
50.0000 ug | PREFILLED_SYRINGE | Freq: Once | INTRAMUSCULAR | Status: AC
  Administered 2021-08-15: 50 ug via INTRAVENOUS
  Filled 2021-08-15: qty 1

## 2021-08-15 MED ORDER — OXYCODONE HCL 5 MG PO TABS
5.0000 mg | ORAL_TABLET | ORAL | Status: DC | PRN
  Administered 2021-08-16 – 2021-08-27 (×43): 5 mg via ORAL
  Filled 2021-08-15 (×43): qty 1

## 2021-08-15 MED ORDER — AMITRIPTYLINE HCL 25 MG PO TABS
150.0000 mg | ORAL_TABLET | Freq: Every day | ORAL | Status: DC
  Administered 2021-08-16: 150 mg via ORAL
  Filled 2021-08-15: qty 6

## 2021-08-15 MED ORDER — PANTOPRAZOLE SODIUM 40 MG PO TBEC
40.0000 mg | DELAYED_RELEASE_TABLET | Freq: Every day | ORAL | Status: DC
  Administered 2021-08-17 – 2021-08-27 (×11): 40 mg via ORAL
  Filled 2021-08-15 (×13): qty 1

## 2021-08-15 MED ORDER — POTASSIUM CHLORIDE 10 MEQ/100ML IV SOLN
10.0000 meq | INTRAVENOUS | Status: AC
  Administered 2021-08-15 – 2021-08-16 (×4): 10 meq via INTRAVENOUS
  Filled 2021-08-15 (×4): qty 100

## 2021-08-15 MED ORDER — DEXTROSE IN LACTATED RINGERS 5 % IV SOLN: INTRAVENOUS | Status: DC

## 2021-08-15 MED ORDER — OXYCODONE-ACETAMINOPHEN 5-325 MG PO TABS
1.0000 | ORAL_TABLET | ORAL | Status: DC | PRN
  Administered 2021-08-16 – 2021-08-27 (×40): 1 via ORAL
  Filled 2021-08-15 (×40): qty 1

## 2021-08-15 MED ORDER — ASPIRIN EC 81 MG PO TBEC
81.0000 mg | DELAYED_RELEASE_TABLET | Freq: Every day | ORAL | Status: DC
  Administered 2021-08-16 – 2021-08-27 (×12): 81 mg via ORAL
  Filled 2021-08-15 (×13): qty 1

## 2021-08-15 MED ORDER — GABAPENTIN 300 MG PO CAPS
300.0000 mg | ORAL_CAPSULE | Freq: Four times a day (QID) | ORAL | Status: DC
  Administered 2021-08-16 – 2021-08-27 (×46): 300 mg via ORAL
  Filled 2021-08-15 (×46): qty 1

## 2021-08-15 MED ORDER — SODIUM CHLORIDE 0.9 % IV BOLUS
1000.0000 mL | Freq: Once | INTRAVENOUS | Status: AC
  Administered 2021-08-15: 1000 mL via INTRAVENOUS

## 2021-08-15 NOTE — ED Provider Notes (Signed)
[facility] Provider Note   CSN: [id number] Arrival date & time: 08/15/21  1823     History  No chief complaint on file.   Troy [name] is a 61 y.o. male.  He has a history of diabetes hypertension left AKA.  He was seen here 2 days ago for nausea vomiting diarrhea.  It is been going on for a day or 2.  He was found to have low potassium.  He was ultimately found to be positive for COVID.  At home he has been doing poorly with poor p.o. intake continuing nausea vomiting and diarrhea.  Generally weak.  He denies any fevers or cough or shortness of breath.  He is not COVID vaccinated and smokes.  The history is provided by the patient and the spouse.  Influenza Presenting symptoms: diarrhea, fatigue, myalgias, nausea and vomiting   Presenting symptoms: no fever, no headaches, no shortness of breath and no sore throat   Severity:  Severe Onset quality:  Gradual Duration:  5 days Progression:  Unchanged Chronicity:  New Relieved by:  Nothing Worsened by:  Nothing Ineffective treatments:  None tried Risk factors: diabetes       Home Medications Prior to Admission medications   Medication Sig Start Date End Date Taking? Authorizing Provider  acetaminophen (TYLENOL) 325 MG tablet Take 2 tablets (650 mg total) by mouth every 6 (six) hours as needed for mild pain (or Fever >/= 101). 02/13/21   Emokpae, Courage, MD  albuterol (VENTOLIN HFA) 108 (90 Base) MCG/ACT inhaler Inhale 2 puffs into the lungs every 4 (four) hours as needed for wheezing or shortness of breath. 02/13/21   Denton Brick, Courage, MD  amitriptyline (ELAVIL) 150 MG tablet Take 150 mg by mouth at bedtime.    [provider]  amLODipine (NORVASC) 10 MG tablet Take 1 tablet (10 mg total) by mouth daily. 02/13/21 02/13/22  Roxan Hockey, MD  ascorbic acid (VITAMIN C) 1000 MG tablet Take 1 tablet (1,000 mg total) by mouth daily. 01/26/21   Persons, Bevely Palmer, [place]  aspirin EC 81 MG EC tablet Take 1  tablet (81 mg total) by mouth daily with breakfast. Swallow whole. 02/13/21   Roxan Hockey, MD  Dapagliflozin-metFORMIN HCl ER 10-998 MG TB24 Take 1 tablet by mouth daily.    [provider]  Dulaglutide 1.5 MG/0.5ML SOPN Inject 1.5 mg into the skin every Sunday. 08/23/18   [provider]  gabapentin (NEURONTIN) 300 MG capsule Take 300 mg by mouth 4 (four) times daily. 12/04/20   [provider]  glucose blood (ONETOUCH VERIO) test strip USE TO CHECK BLOOD SUGAR 1-2 TIME(S) DAILY. 06/07/19   [provider]  Insulin Degludec (TRESIBA) 100 UNIT/ML SOLN Inject 24 Units into the skin in the morning and at bedtime.    [provider]  Insulin Pen Needle (PEN NEEDLES) 32G X 4 MM MISC by Does not apply route. 12/18/19   [provider]  ipratropium (ATROVENT) 0.02 % nebulizer solution Take 2.5 mLs (0.5 mg total) by nebulization 4 (four) times daily as needed for wheezing or shortness of breath. 02/13/21   Roxan Hockey, MD  mupirocin ointment (BACTROBAN) 2 % Place into the nose 2 (two) times daily. 02/13/21   Roxan Hockey, MD  naloxone [facility]) nasal spray 4 mg/0.1 mL Place 1 spray into the nose once. 05/30/20   [provider]  nicotine (NICODERM CQ - DOSED IN MG/24 HOURS) 14 mg/24hr patch Place 1 patch (14 mg total) onto  the skin daily. 02/13/21 02/13/22  Roxan Hockey, MD  omeprazole (PRILOSEC) 40 MG capsule Take 40 mg by mouth daily.    [provider]  ondansetron (ZOFRAN-ODT) 4 MG disintegrating tablet Take 1 tablet (4 mg total) by mouth every 8 (eight) hours as needed for nausea or vomiting. 08/13/21   Horton, Barbette Hair, MD  oxyCODONE-acetaminophen (PERCOCET) 10-325 MG tablet Take 1 tablet by mouth every 4 (four) hours as needed for pain. 01/26/21   Persons, Bevely Palmer, PA  polyethylene glycol (MIRALAX / GLYCOLAX) 17 g packet Take 17 g by mouth daily as needed for mild constipation. 02/13/21   Roxan Hockey, MD  potassium  chloride SA (KLOR-CON M) 20 MEQ tablet Take 2 tablets (40 mEq total) by mouth daily. 08/13/21   Horton, Barbette Hair, MD  tiZANidine (ZANAFLEX) 4 MG tablet Take 1 tablet (4 mg total) by mouth at bedtime. 02/13/21   Roxan Hockey, MD  traZODone (DESYREL) 50 MG tablet Take 50-100 mg by mouth at bedtime. 07/31/20   [provider]      Allergies    Hydromorphone hcl er, Tapentadol, and Exalamide    Review of Systems   Review of Systems  Constitutional:  Positive for fatigue. Negative for fever.  HENT:  Negative for sore throat.   Eyes:  Negative for visual disturbance.  Respiratory:  Negative for shortness of breath.   Cardiovascular:  Negative for chest pain.  Gastrointestinal:  Positive for abdominal pain, diarrhea, nausea and vomiting.  Genitourinary:  Negative for dysuria.  Musculoskeletal:  Positive for back pain and myalgias.  Skin:  Negative for rash.  Neurological:  Negative for headaches.   Physical Exam Updated Vital Signs BP 121/76 (BP Location: Right Arm)    Pulse (!) 122    Temp 98.5 F (36.9 C) (Oral)    Resp 18    SpO2 97%  Physical Exam Vitals and nursing note reviewed.  Constitutional:      General: He is not in acute distress.    Appearance: He is well-developed. He is ill-appearing.  HENT:     Head: Normocephalic and atraumatic.  Eyes:     Conjunctiva/sclera: Conjunctivae normal.  Cardiovascular:     Rate and Rhythm: Tachycardia present. Rhythm irregular.     Heart sounds: No murmur heard. Pulmonary:     Effort: Pulmonary effort is normal. No respiratory distress.     Breath sounds: Normal breath sounds.  Abdominal:     Palpations: Abdomen is soft.     Tenderness: There is no abdominal tenderness. There is no guarding or rebound.  Musculoskeletal:        General: No swelling.     Cervical back: Neck supple.     Comments: Left AKA  Skin:    General: Skin is warm and dry.     Capillary Refill: Capillary refill takes less than 2 seconds.   Neurological:     General: No focal deficit present.     Mental Status: He is alert.    ED Results / Procedures / Treatments   Labs (all labs ordered are listed, but only abnormal results are displayed) Labs Reviewed  LACTIC ACID, PLASMA - Abnormal; Notable for the following components:      Result Value   Lactic Acid, Venous 2.2 (*)    All other components within normal limits  LACTIC ACID, PLASMA - Abnormal; Notable for the following components:   Lactic Acid, Venous 2.0 (*)    All other components within normal limits  COMPREHENSIVE METABOLIC PANEL - Abnormal; Notable for the following components:   Sodium 126 (*)    Potassium 2.8 (*)    Chloride 89 (*)    CO2 17 (*)    Glucose, Bld 299 (*)    Calcium 8.1 (*)    Albumin 3.1 (*)    Total Bilirubin 1.6 (*)    Anion gap 20 (*)    All other components within normal limits  CBC WITH DIFFERENTIAL/PLATELET - Abnormal; Notable for the following components:   WBC 22.9 (*)    Neutro Abs 18.4 (*)    Monocytes Absolute 2.0 (*)    Abs Immature Granulocytes 0.52 (*)    All other components within normal limits  URINALYSIS, ROUTINE W REFLEX MICROSCOPIC - Abnormal; Notable for the following components:   Glucose, UA >=500 (*)    Hgb urine dipstick SMALL (*)    Ketones, ur 80 (*)    Protein, ur >=300 (*)    All other components within normal limits  MAGNESIUM - Abnormal; Notable for the following components:   Magnesium 1.4 (*)    All other components within normal limits  BETA-HYDROXYBUTYRIC ACID - Abnormal; Notable for the following components:   Beta-Hydroxybutyric Acid 6.68 (*)    All other components within normal limits  CBC WITH DIFFERENTIAL/PLATELET - Abnormal; Notable for the following components:   WBC 17.7 (*)    Neutro Abs 14.3 (*)    Monocytes Absolute 1.5 (*)    Abs Immature Granulocytes 0.13 (*)    All other components within normal limits  D-DIMER, QUANTITATIVE - Abnormal; Notable for the following components:    D-Dimer, Quant 1.88 (*)    All other components within normal limits  BASIC METABOLIC PANEL - Abnormal; Notable for the following components:   Sodium 129 (*)    Potassium 2.9 (*)    Chloride 94 (*)    CO2 17 (*)    Glucose, Bld 258 (*)    Calcium 7.8 (*)    Anion gap 18 (*)    All other components within normal limits  BASIC METABOLIC PANEL - Abnormal; Notable for the following components:   Sodium 128 (*)    Potassium 2.9 (*)    Chloride 97 (*)    CO2 19 (*)    Glucose, Bld 200 (*)    Calcium 7.8 (*)    All other components within normal limits  BASIC METABOLIC PANEL - Abnormal; Notable for the following components:   Sodium 129 (*)    Potassium 3.3 (*)    Chloride 97 (*)    Glucose, Bld 148 (*)    Creatinine, Ser 0.57 (*)    Calcium 8.1 (*)    All other components within normal limits  BETA-HYDROXYBUTYRIC ACID - Abnormal; Notable for the following components:   Beta-Hydroxybutyric Acid 6.72 (*)    All other components within normal limits  BETA-HYDROXYBUTYRIC ACID - Abnormal; Notable for the following components:   Beta-Hydroxybutyric Acid 0.76 (*)    All other components within normal limits  GLUCOSE, CAPILLARY - Abnormal; Notable for the following components:   Glucose-Capillary 224 (*)    All other components within normal limits  GLUCOSE, CAPILLARY - Abnormal; Notable for the following components:   Glucose-Capillary 203 (*)    All other components within normal limits  GLUCOSE, CAPILLARY - Abnormal; Notable for the following components:   Glucose-Capillary 186 (*)    All other components within normal limits  GLUCOSE, CAPILLARY - Abnormal; Notable for the following  components:   Glucose-Capillary 158 (*)    All other components within normal limits  GLUCOSE, CAPILLARY - Abnormal; Notable for the following components:   Glucose-Capillary 168 (*)    All other components within normal limits  GLUCOSE, CAPILLARY - Abnormal; Notable for the following components:    Glucose-Capillary 138 (*)    All other components within normal limits  CBG MONITORING, ED - Abnormal; Notable for the following components:   Glucose-Capillary 334 (*)    All other components within normal limits  TROPONIN I (HIGH SENSITIVITY) - Abnormal; Notable for the following components:   Troponin I (High Sensitivity) 23 (*)    All other components within normal limits  TROPONIN I (HIGH SENSITIVITY) - Abnormal; Notable for the following components:   Troponin I (High Sensitivity) 26 (*)    All other components within normal limits  CULTURE, BLOOD (ROUTINE X 2)  CULTURE, BLOOD (ROUTINE X 2)  MRSA NEXT GEN BY PCR, NASAL  LIPASE, BLOOD  PROTIME-INR  PROCALCITONIN  C-REACTIVE PROTEIN  C-REACTIVE PROTEIN  BASIC METABOLIC PANEL  HEMOGLOBIN S0Y  BASIC METABOLIC PANEL  BETA-HYDROXYBUTYRIC ACID    EKG EKG Interpretation  Date/Time:  Saturday August 15 2021 20:27:18 EST Ventricular Rate:  107 PR Interval:  161 QRS Duration: 87 QT Interval:  364 QTC Calculation: 486 R Axis:   87 Text Interpretation: Sinus tachycardia Multiple premature complexes, vent & supraven Borderline right axis deviation Abnormal T, consider ischemia, diffuse leads No significant change since prior 2/23 Confirmed by Aletta Edouard [phone]) on 08/15/2021 8:40:42 PM  Radiology DG Chest Port 1 View  Result Date: 08/15/2021 CLINICAL DATA:  COVID pneumonia, vomiting EXAM: PORTABLE CHEST 1 VIEW COMPARISON:  02/11/2021 FINDINGS: The lungs are symmetrically expanded. There is focal consolidation within the retrocardiac region, possibly infectious in the acute setting. No pneumothorax or pleural effusion. Cardiac size within normal limits. Pulmonary vascularity is normal. IMPRESSION: Retrocardiac consolidation, possibly infectious in the acute setting. Electronically Signed   By: Fidela Salisbury M.D.   On: 08/15/2021 19:57    Procedures .Critical Care Performed by: Hayden Rasmussen, MD Authorized by: Hayden Rasmussen, MD   Critical care provider statement:    Critical care time (minutes):  45   Critical care time was exclusive of:  Separately billable procedures and treating other patients   Critical care was necessary to treat or prevent imminent or life-threatening deterioration of the following conditions:  Metabolic crisis   Critical care was time spent personally by me on the following activities:  Development of treatment plan with patient or surrogate, discussions with consultants, evaluation of patient's response to treatment, examination of patient, obtaining history from patient or surrogate, ordering and performing treatments and interventions, ordering and review of laboratory studies, ordering and review of radiographic studies, pulse oximetry, re-evaluation of patient's condition and review of old charts   I assumed direction of critical care for this patient from another provider in my specialty: no      Medications Ordered in ED Medications  loperamide (IMODIUM) capsule 4 mg (has no administration in time range)  potassium chloride SA (KLOR-CON M) CR tablet 40 mEq (40 mEq Oral Given 08/15/21 2207)  aspirin EC tablet 81 mg (has no administration in time range)  pantoprazole (PROTONIX) EC tablet 40 mg (has no administration in time range)  gabapentin (NEURONTIN) capsule 300 mg (300 mg Oral Given 08/16/21 0135)  enoxaparin (LOVENOX) injection 40 mg (has no administration in time range)  ondansetron (ZOFRAN) tablet 4 mg (  4 mg Oral Given 08/16/21 0125)    Or  ondansetron [facility]) injection 4 mg ( Intravenous See Alternative 08/16/21 0125)  insulin regular, human (MYXREDLIN) 100 units/ 100 mL infusion (4.8 Units/hr Intravenous Rate/Dose Change 08/16/21 0624)  lactated ringers infusion ( Intravenous Not Given 08/16/21 0225)  dextrose 5 % in lactated ringers infusion ( Intravenous Infusion Verify 08/16/21 0323)  dextrose 50 % solution 0-50 mL (has no administration in time range)   oxyCODONE-acetaminophen (PERCOCET/ROXICET) 5-325 MG per tablet 1 tablet (1 tablet Oral Given 08/16/21 0027)    And  oxyCODONE (Oxy IR/ROXICODONE) immediate release tablet 5 mg (has no administration in time range)  potassium chloride SA (KLOR-CON M) CR tablet 40 mEq (has no administration in time range)  nirmatrelvir/ritonavir EUA (PAXLOVID) 3 tablet (has no administration in time range)  Chlorhexidine Gluconate Cloth 2 % PADS 6 each (has no administration in time range)  potassium chloride 10 mEq in 100 mL IVPB (has no administration in time range)  amitriptyline (ELAVIL) tablet 100 mg (has no administration in time range)  living well with diabetes book MISC (has no administration in time range)  sodium chloride 0.9 % bolus 1,000 mL (0 mLs Intravenous Stopped 08/15/21 2201)  ondansetron (ZOFRAN) injection 4 mg (4 mg Intravenous Given 08/15/21 2038)  fentaNYL (SUBLIMAZE) injection 50 mcg (50 mcg Intravenous Given 08/15/21 2036)  magnesium sulfate IVPB 2 g 50 mL (0 g Intravenous Stopped 08/15/21 2345)  potassium chloride 10 mEq in 100 mL IVPB (0 mEq Intravenous Stopped 08/16/21 0318)  lactated ringers bolus 1,000 mL (0 mLs Intravenous Stopped 08/15/21 2345)  remdesivir 100 mg in sodium chloride 0.9 % 100 mL IVPB (0 mg Intravenous Stopped 08/16/21 0121)  potassium chloride 10 mEq in 100 mL IVPB (10 mEq Intravenous New Bag/Given 08/16/21 6389)    ED Course/ Medical Decision Making/ A&P Clinical Course as of 08/16/21 0928  Sat Aug 15, 2021  1959 Chest x-ray interpreted by me as no acute infiltrate.  Awaiting radiology reading. [MB]  2105 Patient's lactate is elevated he does have an elevated white count of 22.  Radiology called a possible retrocardiac infiltrate on x-ray.  In the setting of COVID will hold on antibiotics and aggressive IV fluids until further labs are back as he is not having any respiratory symptoms. [MB]    Clinical Course User Index [MB] Hayden Rasmussen, MD                            Medical Decision Making Amount and/or Complexity of Data Reviewed Labs: ordered. Radiology: ordered.  Risk Prescription drug management. Decision regarding hospitalization.  Troy [name] was evaluated in Emergency Department on 08/15/2021 for the symptoms described in the history of present illness. He was evaluated in the context of the global COVID-19 pandemic, which necessitated consideration that the patient might be at risk for infection with the SARS-CoV-2 virus that causes COVID-19. Institutional protocols and algorithms that pertain to the evaluation of patients at risk for COVID-19 are in a state of rapid change based on information released by regulatory bodies including the CDC and federal and state organizations. These policies and algorithms were followed during the patient's care in the ED.  This patient complains of COVID infection, metabolic derangement generalized weakness, diarrhea; this involves an extensive number of treatment Options and is a complaint that carries with it a high risk of complications and morbidity. The differential includes dehydration, AKI, DKA, metabolic derangement, COVID  infection, sepsis  I ordered, reviewed and interpreted labs, which included CBC with elevated white count stable hemoglobin, chemistries with low sodium low potassium beta hydroxybutyrate elevated, lactate elevated, urinalysis with ketones, procalcitonin negative I ordered medication IV fluids, magnesium and potassium and reviewed PMP when indicated. I ordered imaging studies which included chest x-ray and I independently    visualized and interpreted imaging which showed retrocardiac density possible pneumonia Additional history obtained from patient's wife Previous records obtained and reviewed recent ED visit 2 days ago I consulted Dr. Nehemiah Settle and discussed lab and imaging findings and discussed disposition.  Cardiac monitoring reviewed, sinus tachycardia Social determinants  considered, active tobacco use Critical Interventions: Initiation of IV fluids and repletion of patient's electrolyte maladies  After the interventions stated above, I reevaluated the patient and found patient still to be profoundly weak. Admission and further testing considered, patient will need to be admitted to the hospital for further management.  Lactate trending in the right direction but remains sinus tachycardia and tachypnea.  Patient in agreement with plan for admission.          Final Clinical Impression(s) / ED Diagnoses Final diagnoses:  Diabetic ketoacidosis without coma associated with other specified diabetes mellitus ([place])  COVID-19 virus infection  Hyponatremia  Hypomagnesemia  Hypokalemia    Rx / DC Orders ED Discharge Orders     None         Hayden Rasmussen, MD 08/16/21 [phone]

## 2021-08-15 NOTE — ED Triage Notes (Signed)
Wife reports pt with covid and not getting any better. Vomited around 5am.   Tested positive on wed

## 2021-08-15 NOTE — H&P (Addendum)
History and Physical    Patient: Troy [name]:[id number] DOB: 30-Dec-1960 DOA: 08/15/2021 DOS: the patient was seen and examined on 08/15/2021 PCP: Pcp, No  Patient coming from: Home  Chief Complaint: No chief complaint on file.   HPI: Troy [name] is a 61 y.o. male with medical history significant of diabetes with left AKA, heart failure with preserved EF, GERD, hypertension, hyperlipidemia, COPD with continued tobacco abuse.  Patient was seen in the emergency department 2 days ago due to nausea, vomiting, diarrhea.  He was found to have COVID and was hypokalemic.  Patient was given potassium supplements, antiemetics, and sent home.  He returns today still nauseated, vomiting, diarrhea, with increasing fatigue and weakness.  He has not been able to eat any solid food.  He has been trying to drink Gatorade Zero, but has not been keeping that down well either.  He has not been taking his diabetic medications.  He does feel mildly short of breath, but has no significant swelling.  Denies chest pain, cough, difficulty breathing  Review of Systems: As mentioned in the history of present illness. All other systems reviewed and are negative. Past Medical History:  Diagnosis Date   Acute renal failure ([place])    in setting of NSAID use and orthopedic surgery 2010   Anxiety and depression    Chronic diastolic CHF (congestive heart failure) ([place])    a. Echo 6/17: severe conc LVH, vigorous EF, EF 65-70%, no dynamic obstruction, no RWMA, Gr 1 DD, mild TR  //  b. LHC 8/17: no sig CAD, LVEDP 28   COPD (chronic obstructive pulmonary disease) (HCC)    Diabetic ulcer of left foot (HCC)    DM2 (diabetes mellitus, type 2) ([place])    Dysrhythmia    Family history of early CAD    Fatty liver    GERD (gastroesophageal reflux disease)    History of amputation of foot (HCC)    L trans-met // R toe   History of cardiac catheterization    a. [place] 2002: irregs  //  b. LHC in 8/17: no sig CAD, apical DK,  hyperdynamic LV, LVEDP 28   History of kidney stones    History of nuclear stress test    a. Nuc 7/17: Overall, intermediate risk nuclear stress test secondary to small size of apical lateral defect and reduced ejection fraction.  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    Kidney calculi    Palpitations    PTSD (post-traumatic stress disorder)    Tobacco abuse    Past Surgical History:  Procedure Laterality Date   AMPUTATION Left 01/03/2018   Procedure: LEFT MIDFOOT AMPUTATION/REVISION MIDAMPUTAION;  Surgeon: Mcarthur Rossetti, MD;  Location: [place];  Service: Orthopedics;  Laterality: Left;   AMPUTATION Left 01/25/2018   Procedure: LEFT BELOW KNEE AMPUTATION;  Surgeon: [name], MD;  Location: [facility];  Service: Orthopedics;  Laterality: Left;   AMPUTATION Left 01/21/2021   Procedure: LEFT ABOVE KNEE AMPUTATION;  Surgeon: [name], MD;  Location: [place];  Service: Orthopedics;  Laterality: Left;   AMPUTATION TOE Right 07/17/2019   Procedure: AMPUTATION RIGHT FOOT 2ND TOE;  Surgeon: Mcarthur Rossetti, MD;  Location: [facility];  Service: Orthopedics;  Laterality: Right;   APPLICATION OF WOUND VAC Left 01/21/2021  Procedure: APPLICATION OF WOUND VAC;  Surgeon: [name], MD;  Location: [facility];  Service: Orthopedics;  Laterality: Left;   BELOW KNEE LEG AMPUTATION Left 01/25/2018   CARDIAC CATHETERIZATION N/A 01/22/2016   Procedure: Left Heart Cath and Coronary Angiography;  Surgeon: Peter M Martinique, MD;  Location: [facility];  Service: Cardiovascular;  Laterality: N/A;   FOOT AMPUTATION Bilateral    I & D EXTREMITY Left 12/15/2017   Procedure: IRRIGATION AND DEBRIDEMENT LEFT FOOT ULCER;  Surgeon: Mcarthur Rossetti, MD;  Location: [facility];  Service: Orthopedics;  Laterality: Left;   I & D EXTREMITY Left 07/25/2020   Procedure: LEFT BELOW  KNEE AMPUTATION ABSCESS EXCISION AND SKIN GRAFT;  Surgeon: [name], MD;  Location: [facility] [facility];  Service: Orthopedics;  Laterality: Left;   I & D EXTREMITY Left 08/22/2020   Procedure: DEBRIDEMENT LEFT BELOW KNEE AMPUTATION AND APPLY KERECIS SKIN GRAFT;  Surgeon: [name], MD;  Location: [facility];  Service: Orthopedics;  Laterality: Left;   LITHOTRIPSY     TENDON LENGTHENING Bilateral    calf   TONSILLECTOMY     Social History:  reports that he has been smoking cigarettes. He has a 30.00 pack-year smoking history. He has never used smokeless tobacco. He reports that he does not drink alcohol and does not use drugs.  Allergies  Allergen Reactions   Hydromorphone Hcl Er Anaphylaxis and Other (See Comments)    Allergic to DYE in extended-release tablet, can tolerate other forms of hydromorphone   Tapentadol Anaphylaxis, Swelling and Other (See Comments)    THROAT ANGIOEDEMA Nucynta [Tapentadol Hydrochloride]   Exalamide Other (See Comments)    UNSPECIFIED REACTION     Family History  Problem Relation Age of Onset   Leukemia Mother 25       died   Lung cancer Father 71       died   Heart attack Brother 56   Heart attack Brother 89   Hypertension Brother        X3   Hypertension Sister        X2   Diabetes Sister    Stroke Sister    Diabetes Sister    Other Brother        Musician accident    Prior to Admission medications   Medication Sig Start Date End Date Taking? Authorizing Provider  acetaminophen (TYLENOL) 325 MG tablet Take 2 tablets (650 mg total) by mouth every 6 (six) hours as needed for mild pain (or Fever >/= 101). 02/13/21   Emokpae, Courage, MD  albuterol (VENTOLIN HFA) 108 (90 Base) MCG/ACT inhaler Inhale 2 puffs into the lungs every 4 (four) hours as needed for wheezing or shortness of breath. 02/13/21   Denton Brick, Courage, MD  amitriptyline (ELAVIL) 150 MG tablet Take 150 mg by mouth at bedtime.    [provider]  amLODipine (NORVASC) 10 MG tablet Take 1  tablet (10 mg total) by mouth daily. 02/13/21 02/13/22  Roxan Hockey, MD  ascorbic acid (VITAMIN C) 1000 MG tablet Take 1 tablet (1,000 mg total) by mouth daily. 01/26/21   Persons, Bevely Palmer, [place]  aspirin EC 81 MG EC tablet Take 1 tablet (81 mg total) by mouth daily with breakfast. Swallow whole. 02/13/21   Roxan Hockey, MD  Dapagliflozin-metFORMIN HCl ER 10-998 MG TB24 Take 1 tablet by mouth daily.    [provider]  Dulaglutide 1.5 MG/0.5ML SOPN Inject 1.5 mg into the skin every Sunday. 08/23/18  [provider]  gabapentin (NEURONTIN) 300 MG capsule Take 300 mg by mouth 4 (four) times daily. 12/04/20   [provider]  glucose blood (ONETOUCH VERIO) test strip USE TO CHECK BLOOD SUGAR 1-2 TIME(S) DAILY. 06/07/19   [provider]  Insulin Degludec (TRESIBA) 100 UNIT/ML SOLN Inject 24 Units into the skin in the morning and at bedtime.    [provider]  Insulin Pen Needle (PEN NEEDLES) 32G X 4 MM MISC by Does not apply route. 12/18/19   [provider]  ipratropium (ATROVENT) 0.02 % nebulizer solution Take 2.5 mLs (0.5 mg total) by nebulization 4 (four) times daily as needed for wheezing or shortness of breath. 02/13/21   Roxan Hockey, MD  mupirocin ointment (BACTROBAN) 2 % Place into the nose 2 (two) times daily. 02/13/21   Roxan Hockey, MD  naloxone [facility]) nasal spray 4 mg/0.1 mL Place 1 spray into the nose once. 05/30/20   [provider]  nicotine (NICODERM CQ - DOSED IN MG/24 HOURS) 14 mg/24hr patch Place 1 patch (14 mg total) onto the skin daily. 02/13/21 02/13/22  Roxan Hockey, MD  omeprazole (PRILOSEC) 40 MG capsule Take 40 mg by mouth daily.    [provider]  ondansetron (ZOFRAN-ODT) 4 MG disintegrating tablet Take 1 tablet (4 mg total) by mouth every 8 (eight) hours as needed for nausea or vomiting. 08/13/21   Horton, Barbette Hair, MD  oxyCODONE-acetaminophen (PERCOCET) 10-325 MG tablet Take 1 tablet by  mouth every 4 (four) hours as needed for pain. 01/26/21   Persons, Bevely Palmer, PA  polyethylene glycol (MIRALAX / GLYCOLAX) 17 g packet Take 17 g by mouth daily as needed for mild constipation. 02/13/21   Roxan Hockey, MD  potassium chloride SA (KLOR-CON M) 20 MEQ tablet Take 2 tablets (40 mEq total) by mouth daily. 08/13/21   Horton, Barbette Hair, MD  tiZANidine (ZANAFLEX) 4 MG tablet Take 1 tablet (4 mg total) by mouth at bedtime. 02/13/21   Roxan Hockey, MD  traZODone (DESYREL) 50 MG tablet Take 50-100 mg by mouth at bedtime. 07/31/20   [provider]    Physical Exam: Vitals:   08/15/21 1907 08/15/21 1930  BP: 121/76 (!) 109/49  Pulse: (!) 122 98  Resp: 18 (!) 28  Temp: 98.5 F (36.9 C)   TempSrc: Oral   SpO2: 97% 97%   General: Older male. Awake and alert and oriented x3. No acute cardiopulmonary distress.  HEENT: Normocephalic atraumatic.  Right and left ears normal in appearance.  Pupils equal, round, reactive to light. Extraocular muscles are intact. Sclerae anicteric and noninjected.  Moist mucosal membranes. No mucosal lesions.  Neck: Neck supple without lymphadenopathy. No carotid bruits. No masses palpated.  Cardiovascular: Regular rate with normal S1-S2 sounds. No murmurs, rubs, gallops auscultated. No JVD.  Respiratory: Tachypneic but good respiratory effort.  No wheezes, rales, rhonchi..  No accessory muscle use. Abdomen: Soft, diffusely tender throughout, nondistended. Active bowel sounds. No masses or hepatosplenomegaly  Skin: No rashes, lesions, or ulcerations.  Dry, warm to touch. 2+ dorsalis pedis and radial pulses. Musculoskeletal: No calf or leg pain. All major joints not erythematous nontender.  No upper or lower joint deformation.  Good ROM.  No contractures  Psychiatric: Intact judgment and insight. Pleasant and cooperative. Neurologic: No focal neurological deficits. Strength is 5/5 and symmetric in upper and lower extremities.  Cranial nerves II through  XII are grossly intact.   Data Reviewed: Results for orders placed or performed during the  hospital encounter of 08/15/21 (from the past 24 hour(s))  Lactic acid, plasma     Status: Abnormal   Collection Time: 08/15/21  8:14 PM  Result Value Ref Range   Lactic Acid, Venous 2.2 (HH) 0.5 - 1.9 mmol/L  Comprehensive metabolic panel     Status: Abnormal   Collection Time: 08/15/21  8:14 PM  Result Value Ref Range   Sodium 126 (L) 135 - 145 mmol/L   Potassium 2.8 (L) 3.5 - 5.1 mmol/L   Chloride 89 (L) 98 - 111 mmol/L   CO2 17 (L) 22 - 32 mmol/L   Glucose, Bld 299 (H) 70 - 99 mg/dL   BUN 11 8 - 23 mg/dL   Creatinine, Ser 0.64 0.61 - 1.24 mg/dL   Calcium 8.1 (L) 8.9 - 10.3 mg/dL   Total Protein 7.0 6.5 - 8.1 g/dL   Albumin 3.1 (L) 3.5 - 5.0 g/dL   AST 15 15 - 41 U/L   ALT 14 0 - 44 U/L   Alkaline Phosphatase 78 38 - 126 U/L   Total Bilirubin 1.6 (H) 0.3 - 1.2 mg/dL   GFR, Estimated >60 >60 mL/min   Anion gap 20 (H) 5 - 15  Lipase, blood     Status: None   Collection Time: 08/15/21  8:14 PM  Result Value Ref Range   Lipase 26 11 - 51 U/L  CBC with Differential     Status: Abnormal   Collection Time: 08/15/21  8:14 PM  Result Value Ref Range   WBC 22.9 (H) 4.0 - 10.5 K/uL   RBC 5.51 4.22 - 5.81 MIL/uL   Hemoglobin 15.7 13.0 - 17.0 g/dL   HCT 45.2 39.0 - 52.0 %   MCV 82.0 80.0 - 100.0 fL   MCH 28.5 26.0 - 34.0 pg   MCHC 34.7 30.0 - 36.0 g/dL   RDW 12.4 11.5 - 15.5 %   Platelets 233 150 - 400 K/uL   nRBC 0.0 0.0 - 0.2 %   Neutrophils Relative % 82 %   Neutro Abs 18.4 (H) 1.7 - 7.7 K/uL   Lymphocytes Relative 6 %   Lymphs Abs 1.3 0.7 - 4.0 K/uL   Monocytes Relative 9 %   Monocytes Absolute 2.0 (H) 0.1 - 1.0 K/uL   Eosinophils Relative 1 %   Eosinophils Absolute 0.1 0.0 - 0.5 K/uL   Basophils Relative 0 %   Basophils Absolute 0.1 0.0 - 0.1 K/uL   Immature Granulocytes 2 %   Abs Immature Granulocytes 0.52 (H) 0.00 - 0.07 K/uL  Protime-INR     Status: None   Collection Time:  08/15/21  8:14 PM  Result Value Ref Range   Prothrombin Time 14.4 11.4 - 15.2 seconds   INR 1.1 0.8 - 1.2  Magnesium     Status: Abnormal   Collection Time: 08/15/21  8:14 PM  Result Value Ref Range   Magnesium 1.4 (L) 1.7 - 2.4 mg/dL  Troponin I (High Sensitivity)     Status: Abnormal   Collection Time: 08/15/21  8:14 PM  Result Value Ref Range   Troponin I (High Sensitivity) 23 (H) <18 ng/L  Procalcitonin - Baseline     Status: None   Collection Time: 08/15/21  8:14 PM  Result Value Ref Range   Procalcitonin <0.10 ng/mL   DG Chest Port 1 View  Result Date: 08/15/2021 CLINICAL DATA:  COVID pneumonia, vomiting EXAM: PORTABLE CHEST 1 VIEW COMPARISON:  02/11/2021 FINDINGS: The lungs are symmetrically expanded. There is focal  consolidation within the retrocardiac region, possibly infectious in the acute setting. No pneumothorax or pleural effusion. Cardiac size within normal limits. Pulmonary vascularity is normal. IMPRESSION: Retrocardiac consolidation, possibly infectious in the acute setting. Electronically Signed   By: Fidela Salisbury M.D.   On: 08/15/2021 19:57     Assessment and Plan: No notes have been filed under this hospital service. Service: Hospitalist  Principal Problem:   Ketoacidosis due to type 2 diabetes mellitus ([place]) Active Problems:   HTN (hypertension)   S/P AKA (above knee amputation), left ([place])   COVID-19 virus infection  Ketoacidosis secondary to starvation and diabetes Admit to stepdown B[facility] pending - repeat every 8 hours admit to stepdown Telemetry monitoring Continue aggressive IV hydration CBGs every hour Basic metabolic panel every 4 hours Potassium replacement: 10 mEq IV runs 4 K-Dur 40 mill equivalents twice daily Noncaloric liquids Transition to D5 normal saline at 150 mL's per hour with CBG at 250 Transition to home insulin regimen when gap is closed and acidosis resolved. COVID-19 virus infection Check CRP White count elevated secondary to  ketoacidosis Start remdesivir as patient within 3 days Check CMP tomorrow As patient not having respiratory symptoms, will hold Decadron Hypertension Hold antihypertensives for the present Status post AKA     Advance Care Planning:   Code Status: Prior full code  Consults: None  Family Communication: Wife present, who participates in giving history  Severity of Illness: The appropriate patient status for this patient is INPATIENT. Inpatient status is judged to be reasonable and necessary in order to provide the required intensity of service to ensure the patient's safety. The patient's presenting symptoms, physical exam findings, and initial radiographic and laboratory data in the context of their chronic comorbidities is felt to place them at high risk for further clinical deterioration. Furthermore, it is not anticipated that the patient will be medically stable for discharge from the hospital within 2 midnights of admission.   * I certify that at the point of admission it is my clinical judgment that the patient will require inpatient hospital care spanning beyond 2 midnights from the point of admission due to high intensity of service, high risk for further deterioration and high frequency of surveillance required.*  Author: Truett Mainland, DO 08/15/2021 9:57 PM  For on call review [email or web address].

## 2021-08-15 NOTE — Progress Notes (Signed)
Remdesivir - Pharmacy Brief Note   A/P:  Remdesivir 200 mg IVPB once followed by 100 mg IVPB daily x 4 days.   Hart Robinsons, PharmD Clinical Pharmacist  08/15/2021 9:39 PM

## 2021-08-16 DIAGNOSIS — U071 COVID-19: Secondary | ICD-10-CM | POA: Diagnosis not present

## 2021-08-16 DIAGNOSIS — I10 Essential (primary) hypertension: Secondary | ICD-10-CM

## 2021-08-16 DIAGNOSIS — Z89612 Acquired absence of left leg above knee: Secondary | ICD-10-CM

## 2021-08-16 DIAGNOSIS — E1110 Type 2 diabetes mellitus with ketoacidosis without coma: Secondary | ICD-10-CM | POA: Diagnosis not present

## 2021-08-16 LAB — BASIC METABOLIC PANEL
Anion gap: 12 (ref 5–15)
Anion gap: 10 (ref 5–15)
Anion gap: 8 (ref 5–15)
Anion gap: 7 (ref 5–15)
BUN: 9 mg/dL (ref 8–23)
BUN: 9 mg/dL (ref 8–23)
BUN: 10 mg/dL (ref 8–23)
BUN: 9 mg/dL (ref 8–23)
CO2: 19 mmol/L — ABNORMAL LOW (ref 22–32)
CO2: 22 mmol/L (ref 22–32)
CO2: 22 mmol/L (ref 22–32)
CO2: 20 mmol/L — ABNORMAL LOW (ref 22–32)
Calcium: 7.8 mg/dL — ABNORMAL LOW (ref 8.9–10.3)
Calcium: 8.1 mg/dL — ABNORMAL LOW (ref 8.9–10.3)
Calcium: 7.9 mg/dL — ABNORMAL LOW (ref 8.9–10.3)
Calcium: 7.7 mg/dL — ABNORMAL LOW (ref 8.9–10.3)
Chloride: 97 mmol/L — ABNORMAL LOW (ref 98–111)
Chloride: 97 mmol/L — ABNORMAL LOW (ref 98–111)
Chloride: 98 mmol/L (ref 98–111)
Chloride: 97 mmol/L — ABNORMAL LOW (ref 98–111)
Creatinine, Ser: 0.66 mg/dL (ref 0.61–1.24)
Creatinine, Ser: 0.57 mg/dL — ABNORMAL LOW (ref 0.61–1.24)
Creatinine, Ser: 0.56 mg/dL — ABNORMAL LOW (ref 0.61–1.24)
Creatinine, Ser: 0.49 mg/dL — ABNORMAL LOW (ref 0.61–1.24)
GFR, Estimated: >60 mL/min (ref >60)
GFR, Estimated: >60 mL/min (ref >60)
GFR, Estimated: >60 mL/min (ref >60)
GFR, Estimated: >60 mL/min (ref >60)
Glucose, Bld: 200 mg/dL — ABNORMAL HIGH (ref 70–99)
Glucose, Bld: 148 mg/dL — ABNORMAL HIGH (ref 70–99)
Glucose, Bld: 150 mg/dL — ABNORMAL HIGH (ref 70–99)
Glucose, Bld: 157 mg/dL — ABNORMAL HIGH (ref 70–99)
Potassium: 2.9 mmol/L — ABNORMAL LOW (ref 3.5–5.1)
Potassium: 3.3 mmol/L — ABNORMAL LOW (ref 3.5–5.1)
Potassium: 3.3 mmol/L — ABNORMAL LOW (ref 3.5–5.1)
Potassium: 3.4 mmol/L — ABNORMAL LOW (ref 3.5–5.1)
Sodium: 128 mmol/L — ABNORMAL LOW (ref 135–145)
Sodium: 129 mmol/L — ABNORMAL LOW (ref 135–145)
Sodium: 128 mmol/L — ABNORMAL LOW (ref 135–145)
Sodium: 124 mmol/L — ABNORMAL LOW (ref 135–145)

## 2021-08-16 LAB — GLUCOSE, CAPILLARY
Glucose-Capillary: 224 mg/dL — ABNORMAL HIGH (ref 70–99)
Glucose-Capillary: 203 mg/dL — ABNORMAL HIGH (ref 70–99)
Glucose-Capillary: 158 mg/dL — ABNORMAL HIGH (ref 70–99)
Glucose-Capillary: 186 mg/dL — ABNORMAL HIGH (ref 70–99)
Glucose-Capillary: 168 mg/dL — ABNORMAL HIGH (ref 70–99)
Glucose-Capillary: 138 mg/dL — ABNORMAL HIGH (ref 70–99)
Glucose-Capillary: 187 mg/dL — ABNORMAL HIGH (ref 70–99)
Glucose-Capillary: 157 mg/dL — ABNORMAL HIGH (ref 70–99)
Glucose-Capillary: 151 mg/dL — ABNORMAL HIGH (ref 70–99)
Glucose-Capillary: 169 mg/dL — ABNORMAL HIGH (ref 70–99)
Glucose-Capillary: 137 mg/dL — ABNORMAL HIGH (ref 70–99)
Glucose-Capillary: 134 mg/dL — ABNORMAL HIGH (ref 70–99)
Glucose-Capillary: 159 mg/dL — ABNORMAL HIGH (ref 70–99)
Glucose-Capillary: 133 mg/dL — ABNORMAL HIGH (ref 70–99)
Glucose-Capillary: 210 mg/dL — ABNORMAL HIGH (ref 70–99)
Glucose-Capillary: 219 mg/dL — ABNORMAL HIGH (ref 70–99)
Glucose-Capillary: 176 mg/dL — ABNORMAL HIGH (ref 70–99)
Glucose-Capillary: 144 mg/dL — ABNORMAL HIGH (ref 70–99)

## 2021-08-16 LAB — TROPONIN I (HIGH SENSITIVITY)
Troponin I (High Sensitivity): 23 ng/L — ABNORMAL HIGH (ref <18)
Troponin I (High Sensitivity): 28 ng/L — ABNORMAL HIGH (ref <18)
Troponin I (High Sensitivity): 33 ng/L — ABNORMAL HIGH (ref <18)

## 2021-08-16 LAB — CBC WITH DIFFERENTIAL/PLATELET
Abs Immature Granulocytes: 0.13 10*3/uL — ABNORMAL HIGH (ref 0.00–0.07)
Basophils Absolute: 0 10*3/uL (ref 0.0–0.1)
Basophils Relative: 0 %
Eosinophils Absolute: 0.3 10*3/uL (ref 0.0–0.5)
Eosinophils Relative: 2 %
HCT: 39.6 % (ref 39.0–52.0)
Hemoglobin: 13.7 g/dL (ref 13.0–17.0)
Immature Granulocytes: 1 %
Lymphocytes Relative: 8 %
Lymphs Abs: 1.4 10*3/uL (ref 0.7–4.0)
MCH: 28.4 pg (ref 26.0–34.0)
MCHC: 34.6 g/dL (ref 30.0–36.0)
MCV: 82.2 fL (ref 80.0–100.0)
Monocytes Absolute: 1.5 10*3/uL — ABNORMAL HIGH (ref 0.1–1.0)
Monocytes Relative: 8 %
Neutro Abs: 14.3 10*3/uL — ABNORMAL HIGH (ref 1.7–7.7)
Neutrophils Relative %: 81 %
Platelets: 189 10*3/uL (ref 150–400)
RBC: 4.82 MIL/uL (ref 4.22–5.81)
RDW: 12.4 % (ref 11.5–15.5)
WBC: 17.7 10*3/uL — ABNORMAL HIGH (ref 4.0–10.5)
nRBC: 0 % (ref 0.0–0.2)

## 2021-08-16 LAB — BETA-HYDROXYBUTYRIC ACID
Beta-Hydroxybutyric Acid: 6.72 mmol/L — ABNORMAL HIGH (ref 0.05–0.27)
Beta-Hydroxybutyric Acid: 0.76 mmol/L — ABNORMAL HIGH (ref 0.05–0.27)
Beta-Hydroxybutyric Acid: 0.72 mmol/L — ABNORMAL HIGH (ref 0.05–0.27)

## 2021-08-16 LAB — D-DIMER, QUANTITATIVE: D-Dimer, Quant: 1.88 ug/mL-FEU — ABNORMAL HIGH (ref 0.00–0.50)

## 2021-08-16 LAB — MRSA NEXT GEN BY PCR, NASAL: MRSA by PCR Next Gen: DETECTED — AB

## 2021-08-16 LAB — CBG MONITORING, ED: Glucose-Capillary: 334 mg/dL — ABNORMAL HIGH (ref 70–99)

## 2021-08-16 LAB — C-REACTIVE PROTEIN: CRP: 24.1 mg/dL — ABNORMAL HIGH (ref <1.0)

## 2021-08-16 MED ORDER — MUPIROCIN 2 % EX OINT
1.0000 "application " | TOPICAL_OINTMENT | Freq: Two times a day (BID) | CUTANEOUS | Status: AC
  Administered 2021-08-16 – 2021-08-21 (×5): 1 via NASAL
  Filled 2021-08-16 (×2): qty 22

## 2021-08-16 MED ORDER — GUAIFENESIN-DM 100-10 MG/5ML PO SYRP
5.0000 mL | ORAL_SOLUTION | ORAL | Status: DC | PRN
  Administered 2021-08-16 – 2021-08-23 (×15): 5 mL via ORAL
  Filled 2021-08-16 (×17): qty 5

## 2021-08-16 MED ORDER — LIVING WELL WITH DIABETES BOOK: Freq: Once | Status: DC

## 2021-08-16 MED ORDER — POTASSIUM CHLORIDE 10 MEQ/100ML IV SOLN
10.0000 meq | INTRAVENOUS | Status: AC
  Administered 2021-08-16 (×2): 10 meq via INTRAVENOUS
  Filled 2021-08-16 (×2): qty 100

## 2021-08-16 MED ORDER — MELATONIN 3 MG PO TABS
6.0000 mg | ORAL_TABLET | Freq: Once | ORAL | Status: AC
  Administered 2021-08-16: 6 mg via ORAL
  Filled 2021-08-16: qty 2

## 2021-08-16 MED ORDER — INSULIN ASPART 100 UNIT/ML IJ SOLN
0.0000 [IU] | Freq: Three times a day (TID) | INTRAMUSCULAR | Status: DC
  Administered 2021-08-17 – 2021-08-18 (×5): 7 [IU] via SUBCUTANEOUS
  Administered 2021-08-18: 4 [IU] via SUBCUTANEOUS
  Administered 2021-08-19 (×2): 3 [IU] via SUBCUTANEOUS
  Administered 2021-08-20: 4 [IU] via SUBCUTANEOUS
  Administered 2021-08-20: 3 [IU] via SUBCUTANEOUS
  Administered 2021-08-20 – 2021-08-21 (×2): 4 [IU] via SUBCUTANEOUS
  Administered 2021-08-21: 7 [IU] via SUBCUTANEOUS
  Administered 2021-08-21: 4 [IU] via SUBCUTANEOUS
  Administered 2021-08-22 (×2): 7 [IU] via SUBCUTANEOUS
  Administered 2021-08-22: 11 [IU] via SUBCUTANEOUS
  Administered 2021-08-23: 4 [IU] via SUBCUTANEOUS
  Administered 2021-08-23: 7 [IU] via SUBCUTANEOUS
  Administered 2021-08-23: 4 [IU] via SUBCUTANEOUS
  Administered 2021-08-24: 11 [IU] via SUBCUTANEOUS
  Administered 2021-08-24 – 2021-08-25 (×3): 7 [IU] via SUBCUTANEOUS
  Administered 2021-08-25: 4 [IU] via SUBCUTANEOUS
  Administered 2021-08-25: 3 [IU] via SUBCUTANEOUS
  Administered 2021-08-26: 7 [IU] via SUBCUTANEOUS
  Administered 2021-08-26 (×2): 3 [IU] via SUBCUTANEOUS
  Administered 2021-08-27: 12:00:00 4 [IU] via SUBCUTANEOUS
  Administered 2021-08-27: 08:00:00 7 [IU] via SUBCUTANEOUS

## 2021-08-16 MED ORDER — INSULIN ASPART 100 UNIT/ML IJ SOLN
3.0000 [IU] | Freq: Three times a day (TID) | INTRAMUSCULAR | Status: DC
  Administered 2021-08-17 – 2021-08-21 (×11): 3 [IU] via SUBCUTANEOUS

## 2021-08-16 MED ORDER — NIRMATRELVIR/RITONAVIR (PAXLOVID)TABLET
3.0000 | ORAL_TABLET | Freq: Two times a day (BID) | ORAL | Status: AC
  Administered 2021-08-16 – 2021-08-20 (×10): 3 via ORAL
  Filled 2021-08-16 (×2): qty 30

## 2021-08-16 MED ORDER — AMITRIPTYLINE HCL 25 MG PO TABS
100.0000 mg | ORAL_TABLET | Freq: Every day | ORAL | Status: DC
  Administered 2021-08-16 – 2021-08-17 (×2): 100 mg via ORAL
  Filled 2021-08-16 (×2): qty 4

## 2021-08-16 MED ORDER — INSULIN GLARGINE-YFGN 100 UNIT/ML ~~LOC~~ SOLN
10.0000 [IU] | Freq: Every day | SUBCUTANEOUS | Status: DC
  Administered 2021-08-16 – 2021-08-17 (×2): 10 [IU] via SUBCUTANEOUS
  Filled 2021-08-16 (×3): qty 0.1

## 2021-08-16 MED ORDER — CHLORHEXIDINE GLUCONATE CLOTH 2 % EX PADS
6.0000 | MEDICATED_PAD | Freq: Every day | CUTANEOUS | Status: AC
  Administered 2021-08-17 – 2021-08-21 (×5): 6 via TOPICAL

## 2021-08-16 MED ORDER — POTASSIUM CHLORIDE 10 MEQ/100ML IV SOLN
10.0000 meq | INTRAVENOUS | Status: AC
  Administered 2021-08-16 (×4): 10 meq via INTRAVENOUS
  Filled 2021-08-16 (×2): qty 100

## 2021-08-16 MED ORDER — CHLORHEXIDINE GLUCONATE CLOTH 2 % EX PADS
6.0000 | MEDICATED_PAD | Freq: Every day | CUTANEOUS | Status: DC
  Administered 2021-08-16 – 2021-08-27 (×11): 6 via TOPICAL

## 2021-08-16 MED ORDER — NITROGLYCERIN 0.4 MG SL SUBL
0.4000 mg | SUBLINGUAL_TABLET | SUBLINGUAL | Status: DC | PRN
  Administered 2021-08-16: 0.4 mg via SUBLINGUAL
  Filled 2021-08-16: qty 1

## 2021-08-16 MED ORDER — INSULIN ASPART 100 UNIT/ML IJ SOLN
0.0000 [IU] | Freq: Every day | INTRAMUSCULAR | Status: DC
  Administered 2021-08-17 – 2021-08-21 (×2): 3 [IU] via SUBCUTANEOUS
  Administered 2021-08-25: 2 [IU] via SUBCUTANEOUS

## 2021-08-16 MED ORDER — MUPIROCIN 2 % EX OINT
1.0000 "application " | TOPICAL_OINTMENT | Freq: Two times a day (BID) | CUTANEOUS | Status: AC
  Administered 2021-08-16 – 2021-08-20 (×9): 1 via NASAL

## 2021-08-16 MED ORDER — INSULIN ASPART 100 UNIT/ML IJ SOLN
10.0000 [IU] | Freq: Once | INTRAMUSCULAR | Status: AC
  Administered 2021-08-16: 10 [IU] via SUBCUTANEOUS

## 2021-08-16 MED ORDER — SODIUM CHLORIDE 0.9 % IV SOLN: INTRAVENOUS | Status: DC

## 2021-08-16 MED ORDER — POTASSIUM CHLORIDE CRYS ER 20 MEQ PO TBCR
40.0000 meq | EXTENDED_RELEASE_TABLET | ORAL | Status: AC
  Administered 2021-08-16: 40 meq via ORAL
  Filled 2021-08-16 (×2): qty 2

## 2021-08-16 NOTE — Progress Notes (Signed)
Inpatient Diabetes Program Recommendations  AACE/ADA: New Consensus Statement on Inpatient Glycemic Control (2015)  Target Ranges:  Prepandial:   less than 140 mg/dL      Peak postprandial:   less than 180 mg/dL (1-2 hours)      Critically ill patients:  140 - 180 mg/dL   Lab Results  Component Value Date   GLUCAP 138 (H) 08/16/2021   HGBA1C 12.3 (H) 01/21/2021    Review of Glycemic Control  Diabetes history: DM2 Outpatient Diabetes medications: Not taking Tresiba 24 units bid, Trulicity 1.5 q week, Farxiga/Metformin 5-1gm qd Current orders for Inpatient glycemic control: IV insulin  Inpatient Diabetes Program Recommendations:   Spoke with patient's wife by phone. Patient has not taken any of his medications for several months. Patient uses his scooter and only uses a walker when he goes out to his doctor appointments. Has only been out of the house 1 time to his doctor appointment in the past 2 months. Wife states she has tried to get patient to take medication, go to therapy, and discuss depression with his doctors but patient doesn't.  When transitioning to subcutaneous insulin, please consider: -Semglee 24 units 2 hrs. Prior to IV insulin drip discontinued -Novolog 0-15 units q 4 hrs while NPO, then tid + hs 0-5 units Give Novolog correction @ time of IV insulin discontinued. Will follow during hospitalization and plan to speak with patient when appropriate.  Thank you, Nani Gasser. Hy Swiatek, RN, MSN, CDE  Diabetes Coordinator Inpatient Glycemic Control Team Team Pager [phone] (8am-5pm) 08/16/2021 9:29 AM

## 2021-08-16 NOTE — TOC Progression Note (Signed)
Transition of Care [facility]) - Progression Note    Patient Details  Name: MOUHAMED GLASSCO MRN: [id number] Date of Birth: [birth year]  Transition of Care [facility]) CM/SW Contact  Salome Arnt, [place] Phone Number: 08/16/2021, 11:17 AM  Clinical Narrative:   Transition of Care [facility]) Screening Note   Patient Details  Name: WILLAIM MODE Date of Birth: [birth year]   Transition of Care [facility]) CM/SW Contact:    Salome Arnt, [place] Phone Number: 08/16/2021, 11:17 AM    Transition of Care Department [facility]) has reviewed patient and no TOC needs have been identified at this time. We will continue to monitor patient advancement through interdisciplinary progression rounds. If new patient transition needs arise, please place a TOC consult.         Barriers to Discharge: Continued Medical Work up  Expected Discharge Plan and Services                                                 Social Determinants of Health (SDOH) Interventions    Readmission Risk Interventions No flowsheet data found.

## 2021-08-16 NOTE — Progress Notes (Signed)
Patient's potassium 2.9, MD aware.

## 2021-08-16 NOTE — Progress Notes (Addendum)
PROGRESS NOTE     Troy [name], is a 61 y.o. male, DOB - [birth year], UPJ:[id number]  Admit date - 08/15/2021   Admitting Physician Truett Mainland, DO  Outpatient Primary MD for the patient is Pcp, No  LOS - 1  No chief complaint on file.       Brief Narrative:   61 y.o. male with medical history significant for COPD, DM, HTN, diastolic CHF history of noncompliance and prior amputations and ongoing tobacco abuse admitted on 08/16/2023 with hypokalemia in the setting of acute COVID-19 infection and DKA    -Assessment and Plan:  1)DKA--patient met DKA criteria on admission with a bicarb of 17, anion gap of 20, serum glucose of 299 and beta hydroxybutyric acid of 6.68 -Continue IV fluids, IV insulin, glucose check, frequent BMP and beta hydroxybutyric acid checks per Endo tool protocol -Troy switch from LR/NS to D5 solution when glucose drops below 250, per Endo tool protocol -Metabolic acidosis has improved, anion gap is closed, oral intake is poor -Recent A1c is 12.3 reflecting uncontrolled DM with hyperglycemia PTA -As per patient's wife patient was noncompliant with medications PTA  2)Covid 19 infection--tested positive on 08/13/2021 -No hypoxia or significant acute respiratory symptoms however patient is high risk for decompensation due to comorbid conditions so Treat empirically with Paxlovid  3)Episodes of atypical chest pain--- EKG reviewed, no acute findings -Serial troponins are flat and reassuring -D-dimer is 1.88 in the setting of COVID-19 infection  4)PAD-status post left AKA  and 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 -on 01/21/2021 -Continue aspirin  5)COPD and tobacco abuse -No acute exacerbation, -Bronchodilators as ordered -Smoking cessation strongly advised  6) chronic hyponatremia--- avoid dehydration, continue IV fluids  Disposition/Need for in-Hospital Stay- patient unable to be discharged at this time due to -DKA requiring  IV fluids and IV insulin, lethargy with poor oral intake requiring IV fluids  Status is: Inpatient   Disposition: The patient is from: Home              Anticipated d/c is to: Home              Anticipated d/c date is: 2 days              Patient currently is not medically stable to d/c. Barriers: Not Clinically Stable-   Code Status :  -  Code Status: Full Code   Family Communication:   [name] Troy [name] is primary contact--[phone]  DVT Prophylaxis  :   - SCDs  enoxaparin (LOVENOX) injection 40 mg Start: 08/16/21 1000   Lab Results  Component Value Date   PLT 189 08/16/2021    Inpatient Medications  Scheduled Meds:  amitriptyline  100 mg Oral QHS   aspirin EC  81 mg Oral Q breakfast   Chlorhexidine Gluconate Cloth  6 each Topical Daily   enoxaparin (LOVENOX) injection  40 mg Subcutaneous Q24H   gabapentin  300 mg Oral QID   living well with diabetes book   Does not apply Once   nirmatrelvir/ritonavir EUA  3 tablet Oral BID   pantoprazole  40 mg Oral Daily   potassium chloride  40 mEq Oral BID   Continuous Infusions:  dextrose 5% lactated ringers 125 mL/hr at 08/16/21 1521   insulin 2.8 Units/hr (08/16/21 1521)   lactated ringers     PRN Meds:.dextrose, loperamide, nitroGLYCERIN, ondansetron **OR** ondansetron (ZOFRAN) IV, oxyCODONE-acetaminophen **AND** oxyCODONE   Anti-infectives (From admission, onward)  Start     Dose/Rate Route Frequency Ordered Stop   08/16/21 1800  remdesivir 100 mg in sodium chloride 0.9 % 100 mL IVPB  Status:  Discontinued        100 mg 200 mL/hr over 30 Minutes Intravenous Every 24 hours 08/15/21 2138 08/16/21 0812   08/16/21 1000  nirmatrelvir/ritonavir EUA (PAXLOVID) 3 tablet        3 tablet Oral 2 times daily 08/16/21 0811 08/21/21 0959   08/15/21 2145  remdesivir 100 mg in sodium chloride 0.9 % 100 mL IVPB        100 mg 200 mL/hr over 30 Minutes Intravenous Every 1 hr x 2 08/15/21 2138 08/16/21 0121          Subjective: Troy [name] today has no fevers, no emesis,   -Had episode of chest pain, troponin and EKG reassuring -Oral intake is not great  Objective: Vitals:   08/16/21 1300 08/16/21 1400 08/16/21 1522 08/16/21 1619  BP: 114/64     Pulse: (!) 101 (!) 105  93  Resp: (!) 33   (!) 35  Temp:   97.9 F (36.6 C) 99.8 F (37.7 C)  TempSrc:    Oral  SpO2: 96% 97%  96%  Weight:      Height:        Intake/Output Summary (Last 24 hours) at 08/16/2021 1718 Last data filed at 08/16/2021 1521 Gross per 24 hour  Intake 4772.02 ml  Output --  Net 4772.02 ml   Filed Weights   08/16/21 0239  Weight: 79.7 kg   Physical Exam  Gen:-Somewhat sleepy, in no apparent distress  HEENT:- [place].AT, No sclera icterus Neck-Supple Neck,No JVD,.  Lungs-  CTAB , fair symmetrical air movement CV- S1, S2 normal, regular  Abd-  +ve B.Sounds, Abd Soft, No tenderness,    Extremity/Skin:-Left AKA, right foot with status post amputation of the big toe and second toe psych-affect is flat,, oriented x3 Neuro-generalized weakness, no new focal deficits, no tremors  Data Reviewed: I have personally reviewed following labs and imaging studies  CBC: Recent Labs  Lab 08/13/21 0158 08/15/21 2014 08/16/21 0405  WBC 10.9* 22.9* 17.7*  NEUTROABS 8.6* 18.4* 14.3*  HGB 16.4 15.7 13.7  HCT 45.5 45.2 39.6  MCV 81.1 82.0 82.2  PLT 128* 233 518   Basic Metabolic Panel: Recent Labs  Lab 08/15/21 2014 08/15/21 2229 08/16/21 0404 08/16/21 0611 08/16/21 1025 08/16/21 1458  NA 126* 129* 128* 129* 128* 124*  K 2.8* 2.9* 2.9* 3.3* 3.3* 3.4*  CL 89* 94* 97* 97* 98 97*  CO2 17* 17* 19* 22 22 20*  GLUCOSE 299* 258* 200* 148* 150* 157*  BUN _0 CREATININE 0.64 0.66 0.66 0.57* 0.56* 0.49*  CALCIUM 8.1* 7.8* 7.8* 8.1* 7.9* 7.7*  MG 1.4*  --   --   --   --   --    GFR: Estimated Creatinine Clearance: 100.1 mL/min (A) (by C-G formula based on SCr of 0.49 mg/dL (L)). Liver Function Tests: Recent  Labs  Lab 08/13/21 0158 08/15/21 2014  AST 24 15  ALT 25 14  ALKPHOS 88 78  BILITOT 0.9 1.6*  PROT 6.9 7.0  ALBUMIN 3.6 3.1*   Cardiac Enzymes: No results for input(s): CKTOTAL, CKMB, CKMBINDEX, TROPONINI in the last 168 hours. BNP (last 3 results) No results for input(s): PROBNP in the last 8760 hours. HbA1C: No results for input(s): HGBA1C in the last 72 hours. Sepsis Labs: _1 (procalcitonin:4,lacticidven:4) )  Recent Results (from the past 240 hour(s))  Resp Panel by RT-PCR (Flu A&B, Covid) Nasopharyngeal Swab     Status: Abnormal   Collection Time: 08/13/21  6:28 AM   Specimen: Nasopharyngeal Swab; Nasopharyngeal(NP) swabs in vial transport medium  Result Value Ref Range Status   SARS Coronavirus 2 by RT PCR POSITIVE (A) NEGATIVE Final    Comment: (NOTE) SARS-CoV-2 target nucleic acids are DETECTED.  The SARS-CoV-2 RNA is generally detectable in upper respiratory specimens during the acute phase of infection. Positive results are indicative of the presence of the identified virus, but do not rule out bacterial infection or co-infection with other pathogens not detected by the test. Clinical correlation with patient history and other diagnostic information is necessary to determine patient infection status. The expected result is Negative.  Fact Sheet for Patients: EntrepreneurPulse.com.au  Fact Sheet for Healthcare Providers: IncredibleEmployment.be  This test is not yet approved or cleared by the [place] FDA and  has been authorized for detection and/or diagnosis of SARS-CoV-2 by FDA under an Emergency Use Authorization (EUA).  This EUA will remain in effect (meaning this test can be used) for the duration of  the COVID-19 declaration under Section 564(b)(1) of the A ct, 21 U.S.C. section 360bbb-3(b)(1), unless the authorization is terminated or revoked sooner.     Influenza A by PCR NEGATIVE NEGATIVE Final    Influenza B by PCR NEGATIVE NEGATIVE Final    Comment: (NOTE) The Xpert Xpress SARS-CoV-2/FLU/RSV plus assay is intended as an aid in the diagnosis of influenza from Nasopharyngeal swab specimens and should not be used as a sole basis for treatment. Nasal washings and aspirates are unacceptable for Xpert Xpress SARS-CoV-2/FLU/RSV testing.  Fact Sheet for Patients: EntrepreneurPulse.com.au  Fact Sheet for Healthcare Providers: IncredibleEmployment.be  This test is not yet approved or cleared by the [place] FDA and has been authorized for detection and/or diagnosis of SARS-CoV-2 by FDA under an Emergency Use Authorization (EUA). This EUA will remain in effect (meaning this test can be used) for the duration of the COVID-19 declaration under Section 564(b)(1) of the Act, 21 U.S.C. section 360bbb-3(b)(1), unless the authorization is terminated or revoked.  Performed at [facility], [place] [address] [place], [place] [address]   Culture, blood (routine x 2)     Status: None (Preliminary result)   Collection Time: 08/15/21  8:14 PM   Specimen: BLOOD LEFT FOREARM  Result Value Ref Range Status   Specimen Description BLOOD LEFT FOREARM BOTTLES DRAWN AEROBIC ONLY  Final   Special Requests   Final    Blood Culture adequate volume Performed at [facility], [address], [place], [place] [address]    Culture PENDING  Incomplete   Report Status PENDING  Incomplete      Radiology Studies: DG Chest Port 1 View  Result Date: 08/15/2021 CLINICAL DATA:  COVID pneumonia, vomiting EXAM: PORTABLE CHEST 1 VIEW COMPARISON:  02/11/2021 FINDINGS: The lungs are symmetrically expanded. There is focal consolidation within the retrocardiac region, possibly infectious in the acute setting. No pneumothorax or pleural effusion. Cardiac size within normal limits. Pulmonary vascularity is normal. IMPRESSION: Retrocardiac consolidation, possibly infectious in  the acute setting. Electronically Signed   By: Fidela Salisbury M.D.   On: 08/15/2021 19:57     Scheduled Meds:  amitriptyline  100 mg Oral QHS   aspirin EC  81 mg Oral Q breakfast   Chlorhexidine Gluconate Cloth  6 each Topical Daily   enoxaparin (LOVENOX) injection  40 mg Subcutaneous Q24H   gabapentin  300 mg Oral QID   living well with diabetes book   Does not apply Once   nirmatrelvir/ritonavir EUA  3 tablet Oral BID   pantoprazole  40 mg Oral Daily   potassium chloride  40 mEq Oral BID   Continuous Infusions:  dextrose 5% lactated ringers 125 mL/hr at 08/16/21 1521   insulin 2.8 Units/hr (08/16/21 1521)   lactated ringers       LOS: 1 day    Roxan Hockey M.D on 08/16/2021 at 5:18 PM  Go to [email or web address] - for contact info  [facility] - Office  [phone]  If 7PM-7AM, please contact night-coverage [email or web address] Password [facility] 08/16/2021, 5:18 PM

## 2021-08-17 DIAGNOSIS — U071 COVID-19: Secondary | ICD-10-CM | POA: Diagnosis not present

## 2021-08-17 DIAGNOSIS — I10 Essential (primary) hypertension: Secondary | ICD-10-CM | POA: Diagnosis not present

## 2021-08-17 DIAGNOSIS — E1110 Type 2 diabetes mellitus with ketoacidosis without coma: Secondary | ICD-10-CM | POA: Diagnosis not present

## 2021-08-17 LAB — BLOOD CULTURE ID PANEL (REFLEXED) - BCID2

## 2021-08-17 LAB — BASIC METABOLIC PANEL
Anion gap: 6 (ref 5–15)
BUN: 9 mg/dL (ref 8–23)
CO2: 19 mmol/L — ABNORMAL LOW (ref 22–32)
Calcium: 7.5 mg/dL — ABNORMAL LOW (ref 8.9–10.3)
Chloride: 100 mmol/L (ref 98–111)
Creatinine, Ser: 0.56 mg/dL — ABNORMAL LOW (ref 0.61–1.24)
GFR, Estimated: >60 mL/min (ref >60)
Glucose, Bld: 257 mg/dL — ABNORMAL HIGH (ref 70–99)
Potassium: 3.9 mmol/L (ref 3.5–5.1)
Sodium: 125 mmol/L — ABNORMAL LOW (ref 135–145)

## 2021-08-17 LAB — CBC WITH DIFFERENTIAL/PLATELET
Abs Immature Granulocytes: 0.17 10*3/uL — ABNORMAL HIGH (ref 0.00–0.07)
Basophils Absolute: 0.1 10*3/uL (ref 0.0–0.1)
Basophils Relative: 0 %
Eosinophils Absolute: 0.1 10*3/uL (ref 0.0–0.5)
Eosinophils Relative: 1 %
HCT: 37.8 % — ABNORMAL LOW (ref 39.0–52.0)
Hemoglobin: 13.4 g/dL (ref 13.0–17.0)
Immature Granulocytes: 1 %
Lymphocytes Relative: 5 %
Lymphs Abs: 0.9 10*3/uL (ref 0.7–4.0)
MCH: 29.5 pg (ref 26.0–34.0)
MCHC: 35.4 g/dL (ref 30.0–36.0)
MCV: 83.3 fL (ref 80.0–100.0)
Monocytes Absolute: 1.8 10*3/uL — ABNORMAL HIGH (ref 0.1–1.0)
Monocytes Relative: 11 %
Neutro Abs: 13.4 10*3/uL — ABNORMAL HIGH (ref 1.7–7.7)
Neutrophils Relative %: 82 %
Platelets: 164 10*3/uL (ref 150–400)
RBC: 4.54 MIL/uL (ref 4.22–5.81)
RDW: 12.4 % (ref 11.5–15.5)
WBC: 16.4 10*3/uL — ABNORMAL HIGH (ref 4.0–10.5)
nRBC: 0 % (ref 0.0–0.2)

## 2021-08-17 LAB — RAPID URINE DRUG SCREEN, HOSP PERFORMED
Amphetamines: NOT DETECTED
Barbiturates: NOT DETECTED
Benzodiazepines: NOT DETECTED
Cocaine: NOT DETECTED
Opiates: POSITIVE — AB
Tetrahydrocannabinol: NOT DETECTED

## 2021-08-17 LAB — GLUCOSE, CAPILLARY
Glucose-Capillary: 243 mg/dL — ABNORMAL HIGH (ref 70–99)
Glucose-Capillary: 227 mg/dL — ABNORMAL HIGH (ref 70–99)
Glucose-Capillary: 236 mg/dL — ABNORMAL HIGH (ref 70–99)
Glucose-Capillary: 270 mg/dL — ABNORMAL HIGH (ref 70–99)

## 2021-08-17 LAB — C-REACTIVE PROTEIN: CRP: 24.5 mg/dL — ABNORMAL HIGH (ref <1.0)

## 2021-08-17 LAB — D-DIMER, QUANTITATIVE: D-Dimer, Quant: 1.87 ug/mL-FEU — ABNORMAL HIGH (ref 0.00–0.50)

## 2021-08-17 LAB — TROPONIN I (HIGH SENSITIVITY): Troponin I (High Sensitivity): 25 ng/L — ABNORMAL HIGH (ref <18)

## 2021-08-17 MED ORDER — INSULIN GLARGINE-YFGN 100 UNIT/ML ~~LOC~~ SOLN
8.0000 [IU] | Freq: Two times a day (BID) | SUBCUTANEOUS | Status: DC
  Administered 2021-08-17 – 2021-08-21 (×8): 8 [IU] via SUBCUTANEOUS
  Filled 2021-08-17 (×12): qty 0.08

## 2021-08-17 NOTE — Progress Notes (Signed)
Date and time results received: 08/17/21 1000 (use smartphrase ".now" to insert current time)  Test: Blood Cultures (1 bottle) Critical Value: Gram (+) Cocci  Name of Provider Notified: Courage, MD.  Orders Received? Or Actions Taken?: Orders Received - See Orders for details

## 2021-08-17 NOTE — Progress Notes (Signed)
PROGRESS NOTE     Troy [name], is a 61 y.o. male, DOB - 14-Jul-1960, YPP:[id number]  Admit date - 08/15/2021   Admitting Physician Truett Mainland, DO  Outpatient Primary MD for the patient is Pcp, No  LOS - 2  CC---cough   Brief Narrative:  61 y.o. male with medical history significant for COPD, DM, HTN, diastolic CHF history of noncompliance and prior amputations and ongoing tobacco abuse admitted on 08/16/2023 with hypokalemia in the setting of acute COVID-19 infection and DKA   -Assessment and Plan:  1)DKA--POA - bicarb 17 >>19,  anion gap of 20 >>6, serum glucose of 299  -beta hydroxybutyric acid of 4.58 >>0.99 --Metabolic acidosis has improved, anion gap is closed DKA pathophysiology resolved, -Patient transition from IV insulin to subcu insulin -Recent A1c is 12.3 reflecting uncontrolled DM with hyperglycemia PTA -As per patient's wife patient was noncompliant with medications PTA  2)Covid 19 infection--tested positive on 08/13/2021 -No hypoxia or significant acute respiratory symptoms however patient is high risk for decompensation due to comorbid conditions -c/n Paxlovid -Repeat x-rays in the a.m.  3)Episodes of atypical chest pain--- EKG reviewed, no acute findings -Serial troponins from 08/16/2021 and 08/17/2021 are flat and reassuring -D-dimer is 1.88>>1.87 in the setting of COVID-19 infection  4)PAD-status post left AKA  and 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 -on 01/21/2021 -Initially had Lt BKA in August of 2019, haf revision to AKA in August 2022 -Continue aspirin  5)COPD and tobacco abuse -No acute exacerbation, -Bronchodilators as ordered -Smoking cessation strongly advised  6)Chronic hyponatremia--- Na is 125, -Suspect due to GI losses -Patient with dehydration in the setting of COVID-19 infection nausea vomiting and diarrhea  continue IV fluids  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 Barriers: Not Clinically Stable-   Code Status :  -  Code Status: Full Code   Family Communication:   [name] Troy [name] is primary contact--[phone]  DVT Prophylaxis  :   - SCDs  enoxaparin (LOVENOX) injection 40 mg Start: 08/16/21 1000  Lab Results  Component Value Date   PLT 164 08/17/2021   Inpatient Medications  Scheduled Meds:  amitriptyline  100 mg Oral QHS   aspirin EC  81 mg Oral Q breakfast   Chlorhexidine Gluconate Cloth  6 each Topical Daily   Chlorhexidine Gluconate Cloth  6 each Topical Q0600   enoxaparin (LOVENOX) injection  40 mg Subcutaneous Q24H   gabapentin  300 mg Oral QID   insulin aspart  0-20 Units Subcutaneous TID WC   insulin aspart  0-5 Units Subcutaneous QHS   insulin aspart  3 Units Subcutaneous TID WC   insulin glargine-yfgn  8 Units Subcutaneous BID   living well with diabetes book   Does not apply  Once   mupirocin ointment  1 application Nasal BID   mupirocin ointment  1 application Nasal BID   nirmatrelvir/ritonavir EUA  3 tablet Oral BID   pantoprazole  40 mg Oral Daily   potassium chloride  40 mEq Oral BID   Continuous Infusions:  sodium chloride 100 mL/hr at 08/17/21 1519   PRN Meds:.dextrose, guaiFENesin-dextromethorphan, loperamide, nitroGLYCERIN, ondansetron **OR** ondansetron (ZOFRAN) IV, oxyCODONE-acetaminophen **AND** oxyCODONE   Anti-infectives (From admission, onward)    Start     Dose/Rate Route  Frequency Ordered Stop   08/16/21 1800  remdesivir 100 mg in sodium chloride 0.9 % 100 mL IVPB  Status:  Discontinued        100 mg 200 mL/hr over 30 Minutes Intravenous Every 24 hours 08/15/21 2138 08/16/21 0812   08/16/21 1000  nirmatrelvir/ritonavir EUA (PAXLOVID) 3 tablet        3 tablet Oral 2 times daily 08/16/21 0811 08/21/21 0959   08/15/21 2145  remdesivir 100 mg in sodium chloride 0.9 % 100 mL IVPB        100 mg 200 mL/hr over 30 Minutes Intravenous Every 1 hr x 2 08/15/21 2138 08/16/21 0121      Subjective: Troy [name] today has no fevers, no emesis,   -[name] visited -Had episode of chest discomfort with cough and diaphoresis, -EKG and serial troponins reassuring  Objective: Vitals:   08/17/21 1400 08/17/21 1500 08/17/21 1600 08/17/21 1613  BP: 129/86 118/73 (!) 177/87   Pulse: 93 93 (!) 104   Resp: (!) 30 20 (!) 28   Temp:    98.1 F (36.7 C)  TempSrc:    Axillary  SpO2: 94% 95% 95%   Weight:      Height:        Intake/Output Summary (Last 24 hours) at 08/17/2021 1730 Last data filed at 08/17/2021 1519 Gross per 24 hour  Intake 2561.8 ml  Output 800 ml  Net 1761.8 ml   Filed Weights   08/16/21 0239 08/17/21 0500  Weight: 79.7 kg 81.1 kg   Physical Exam  Gen:-More awake, in no apparent distress  HEENT:- [place].AT, No sclera icterus Neck-Supple Neck,No JVD,.  Lungs-air movement is fair, few scattered rhonchi  CV- S1, S2 normal, regular  Abd-  +ve B.Sounds, Abd Soft, No tenderness,    Extremity/Skin:-Left AKA, right foot with status post amputation of the big toe and second toe psych-affect is flat,, oriented x3 Neuro-generalized weakness, no new focal deficits, no tremors  Data Reviewed: I have personally reviewed following labs and imaging studies  CBC: Recent Labs  Lab 08/13/21 0158 08/15/21 2014 08/16/21 0405 08/17/21 0457  WBC 10.9* 22.9* 17.7* 16.4*  NEUTROABS 8.6* 18.4* 14.3* 13.4*  HGB 16.4 15.7 13.7 13.4  HCT 45.5 45.2 39.6 37.8*   MCV 81.1 82.0 82.2 83.3  PLT 128* 233 189 122   Basic Metabolic Panel: Recent Labs  Lab 08/15/21 2014 08/15/21 2229 08/16/21 0404 08/16/21 0611 08/16/21 1025 08/16/21 1458 08/17/21 0457  NA 126*   < > 128* 129* 128* 124* 125*  K 2.8*   < > 2.9* 3.3* 3.3* 3.4* 3.9  CL 89*   < > 97* 97* 98 97* 100  CO2 17*   < > 19* 22 22 20* 19*  GLUCOSE 299*   < > 200* 148* 150* 157* 257*  BUN 11   < > 9 9 10 9 9   CREATININE 0.64   < > 0.66 0.57* 0.56* 0.49* 0.56*  CALCIUM 8.1*   < >  7.8* 8.1* 7.9* 7.7* 7.5*  MG 1.4*  --   --   --   --   --   --    < > = values in this interval not displayed.   GFR: Estimated Creatinine Clearance: 100.1 mL/min (A) (by C-G formula based on SCr of 0.56 mg/dL (L)). Liver Function Tests: Recent Labs  Lab 08/13/21 0158 08/15/21 2014  AST 24 15  ALT 25 14  ALKPHOS 88 78  BILITOT 0.9 1.6*  PROT 6.9 7.0  ALBUMIN 3.6 3.1*   Cardiac Enzymes: No results for input(s): CKTOTAL, CKMB, CKMBINDEX, TROPONINI in the last 168 hours. BNP (last 3 results) No results for input(s): PROBNP in the last 8760 hours. HbA1C: No results for input(s): HGBA1C in the last 72 hours. Sepsis Labs: @LABRCNTIP (procalcitonin:4,lacticidven:4) ) Recent Results (from the past 240 hour(s))  Resp Panel by RT-PCR (Flu A&B, Covid) Nasopharyngeal Swab     Status: Abnormal   Collection Time: 08/13/21  6:28 AM   Specimen: Nasopharyngeal Swab; Nasopharyngeal(NP) swabs in vial transport medium  Result Value Ref Range Status   SARS Coronavirus 2 by RT PCR POSITIVE (A) NEGATIVE Final    Comment: (NOTE) SARS-CoV-2 target nucleic acids are DETECTED.  The SARS-CoV-2 RNA is generally detectable in upper respiratory specimens during the acute phase of infection. Positive results are indicative of the presence of the identified virus, but do not rule out bacterial infection or co-infection with other pathogens not detected by the test. Clinical correlation with patient history and other  diagnostic information is necessary to determine patient infection status. The expected result is Negative.  Fact Sheet for Patients: EntrepreneurPulse.com.au  Fact Sheet for Healthcare Providers: IncredibleEmployment.be  This test is not yet approved or cleared by the [place] FDA and  has been authorized for detection and/or diagnosis of SARS-CoV-2 by FDA under an Emergency Use Authorization (EUA).  This EUA will remain in effect (meaning this test can be used) for the duration of  the COVID-19 declaration under Section 564(b)(1) of the A ct, 21 U.S.C. section 360bbb-3(b)(1), unless the authorization is terminated or revoked sooner.     Influenza A by PCR NEGATIVE NEGATIVE Final   Influenza B by PCR NEGATIVE NEGATIVE Final    Comment: (NOTE) The Xpert Xpress SARS-CoV-2/FLU/RSV plus assay is intended as an aid in the diagnosis of influenza from Nasopharyngeal swab specimens and should not be used as a sole basis for treatment. Nasal washings and aspirates are unacceptable for Xpert Xpress SARS-CoV-2/FLU/RSV testing.  Fact Sheet for Patients: EntrepreneurPulse.com.au  Fact Sheet for Healthcare Providers: IncredibleEmployment.be  This test is not yet approved or cleared by the [place] FDA and has been authorized for detection and/or diagnosis of SARS-CoV-2 by FDA under an Emergency Use Authorization (EUA). This EUA will remain in effect (meaning this test can be used) for the duration of the COVID-19 declaration under Section 564(b)(1) of the Act, 21 U.S.C. section 360bbb-3(b)(1), unless the authorization is terminated or revoked.  Performed at [facility], [place] [address], [place], [place] [address]   Culture, blood (routine x 2)     Status: None (Preliminary result)   Collection Time: 08/15/21  8:14 PM   Specimen: BLOOD LEFT FOREARM  Result Value Ref Range Status   Specimen  Description   Final    BLOOD LEFT FOREARM BOTTLES DRAWN AEROBIC ONLY Performed at [facility], [address] [place], [place] [address]    Special Requests   Final    Blood Culture  adequate volume Performed at [facility], [address] [place], [place] [address]    Culture  Setup Time   Final    GRAM POSITIVE COCCI AEROBIC BOTTLE ONLY Gram Stain Report Called to,Read Back By and Verified With: B. FOLEY @ [phone] 08/17/21 BY STEPHTR CRITICAL RESULT CALLED TO, READ BACK BY AND VERIFIED WITH: Vena Austria S.HURTH ON [id number] AT 9 BY E.PARRISH Performed at [facility], [place] [address] [place], [place] [address]    Culture GRAM POSITIVE COCCI  Final   Report Status PENDING  Incomplete  Blood Culture ID Panel (Reflexed)     Status: Abnormal   Collection Time: 08/15/21  8:14 PM  Result Value Ref Range Status   Enterococcus faecalis NOT DETECTED NOT DETECTED Final   Enterococcus Faecium NOT DETECTED NOT DETECTED Final   Listeria monocytogenes NOT DETECTED NOT DETECTED Final   Staphylococcus species DETECTED (A) NOT DETECTED Final    Comment: CRITICAL RESULT CALLED TO, READ BACK BY AND VERIFIED WITH: PHARM D S.HURTH ON [phone] AT 1436 BY E.PARRISH    Staphylococcus aureus (BCID) NOT DETECTED NOT DETECTED Final   Staphylococcus epidermidis NOT DETECTED NOT DETECTED Final   Staphylococcus lugdunensis NOT DETECTED NOT DETECTED Final   Streptococcus species NOT DETECTED NOT DETECTED Final   Streptococcus agalactiae NOT DETECTED NOT DETECTED Final   Streptococcus pneumoniae NOT DETECTED NOT DETECTED Final   Streptococcus pyogenes NOT DETECTED NOT DETECTED Final   A.calcoaceticus-baumannii NOT DETECTED NOT DETECTED Final   Bacteroides fragilis NOT DETECTED NOT DETECTED Final   Enterobacterales NOT DETECTED NOT DETECTED Final   Enterobacter cloacae complex NOT DETECTED NOT DETECTED Final   Escherichia coli NOT DETECTED NOT DETECTED Final   Klebsiella aerogenes NOT DETECTED NOT DETECTED  Final   Klebsiella oxytoca NOT DETECTED NOT DETECTED Final   Klebsiella pneumoniae NOT DETECTED NOT DETECTED Final   Proteus species NOT DETECTED NOT DETECTED Final   Salmonella species NOT DETECTED NOT DETECTED Final   Serratia marcescens NOT DETECTED NOT DETECTED Final   Haemophilus influenzae NOT DETECTED NOT DETECTED Final   Neisseria meningitidis NOT DETECTED NOT DETECTED Final   Pseudomonas aeruginosa NOT DETECTED NOT DETECTED Final   Stenotrophomonas maltophilia NOT DETECTED NOT DETECTED Final   Candida albicans NOT DETECTED NOT DETECTED Final   Candida auris NOT DETECTED NOT DETECTED Final   Candida glabrata NOT DETECTED NOT DETECTED Final   Candida krusei NOT DETECTED NOT DETECTED Final   Candida parapsilosis NOT DETECTED NOT DETECTED Final   Candida tropicalis NOT DETECTED NOT DETECTED Final   Cryptococcus neoformans/gattii NOT DETECTED NOT DETECTED Final    Comment: Performed at [facility], [address], [place], [place] [address]  Culture, blood (routine x 2)     Status: None (Preliminary result)   Collection Time: 08/15/21  8:39 PM   Specimen: BLOOD RIGHT HAND  Result Value Ref Range Status   Specimen Description BLOOD RIGHT HAND  Final   Special Requests   Final    BOTTLES DRAWN AEROBIC ONLY Blood Culture adequate volume   Culture   Final    NO GROWTH 2 DAYS Performed at [facility], [address], [place],  [address]    Report Status PENDING  Incomplete  MRSA Next Gen by PCR, Nasal     Status: Abnormal   Collection Time: 08/16/21  2:15 AM   Specimen: Nasal Mucosa; Nasal Swab  Result Value Ref Range Status   MRSA by PCR Next Gen DETECTED (A) NOT DETECTED  Final    Comment: RESULT CALLED TO, READ BACK BY AND VERIFIED WITH: KINDLEY,C @ 4580 ON 08/16/21 BY JUW (NOTE) The GeneXpert MRSA Assay (FDA approved for NASAL specimens only), is one component of a comprehensive MRSA colonization surveillance program. It is not intended to diagnose MRSA  infection nor to guide or monitor treatment for MRSA infections. Test performance is not FDA approved in patients less than 55 years old. Performed at [facility], [address] [place], [place] [address]       Radiology Studies: [facility] 1 View  Result Date: 08/15/2021 CLINICAL DATA:  COVID pneumonia, vomiting EXAM: PORTABLE CHEST 1 VIEW COMPARISON:  02/11/2021 FINDINGS: The lungs are symmetrically expanded. There is focal consolidation within the retrocardiac region, possibly infectious in the acute setting. No pneumothorax or pleural effusion. Cardiac size within normal limits. Pulmonary vascularity is normal. IMPRESSION: Retrocardiac consolidation, possibly infectious in the acute setting. Electronically Signed   By: Fidela Salisbury M.D.   On: 08/15/2021 19:57     Scheduled Meds:  amitriptyline  100 mg Oral QHS   aspirin EC  81 mg Oral Q breakfast   Chlorhexidine Gluconate Cloth  6 each Topical Daily   Chlorhexidine Gluconate Cloth  6 each Topical Q0600   enoxaparin (LOVENOX) injection  40 mg Subcutaneous Q24H   gabapentin  300 mg Oral QID   insulin aspart  0-20 Units Subcutaneous TID WC   insulin aspart  0-5 Units Subcutaneous QHS   insulin aspart  3 Units Subcutaneous TID WC   insulin glargine-yfgn  8 Units Subcutaneous BID   living well with diabetes book   Does not apply Once   mupirocin ointment  1 application Nasal BID   mupirocin ointment  1 application Nasal BID   nirmatrelvir/ritonavir EUA  3 tablet Oral BID   pantoprazole  40 mg Oral Daily   potassium chloride  40 mEq Oral BID   Continuous Infusions:  sodium chloride 100 mL/hr at 08/17/21 1519     LOS: 2 days   Roxan Hockey M.D on 08/17/2021 at 5:30 PM  Go to [email or web address] - for contact info  [facility] - Office  [phone]  If 7PM-7AM, please contact night-coverage [email or web address] Password [facility] 08/17/2021, 5:30 PM

## 2021-08-17 NOTE — Progress Notes (Signed)
Inpatient Diabetes Program Recommendations  AACE/ADA: New Consensus Statement on Inpatient Glycemic Control (2015)  Target Ranges:  Prepandial:   less than 140 mg/dL      Peak postprandial:   less than 180 mg/dL (1-2 hours)      Critically ill patients:  140 - 180 mg/dL   Lab Results  Component Value Date   GLUCAP 243 (H) 08/17/2021   HGBA1C 12.3 (H) 01/21/2021    Review of Glycemic Control  Diabetes history: DM 2 Outpatient Diabetes medications: prescribed dapagliflozin-metformin [id number] mg Daily, Trulicity  mg QSunday, tresiba 24 units bid Current orders for Inpatient glycemic control:  Novolog 0-20 units tid + hs Novolog 3 units tid meal coverage Semglee 10 units Daily  Inpatient Diabetes Program Recommendations:    Attempted to call pt room to discuss DM education with pt. Per RN report, pt still confused. Will follow pt for appropriate time to discuss.  Per chart in the past pt has reported wife helped with insulin... DM Coordinator has already spoken with wife this admission.  Thanks,  Tama Headings RN, MSN, BC-ADM Inpatient Diabetes Coordinator Team Pager [phone] (8a-5p)

## 2021-08-17 NOTE — Progress Notes (Signed)
PHARMACY - PHYSICIAN COMMUNICATION CRITICAL VALUE ALERT - BLOOD CULTURE IDENTIFICATION (BCID)  Troy [name] is an 61 y.o. male who presented to [place] on 08/15/2021   Assessment:  staph species 1/2 bottles  Name of physician (or Provider) Contacted: Dr. Denton Brick  Current antibiotics: None  Changes to prescribed antibiotics recommended:  Patient is on recommended antibiotics - No changes needed  Results for orders placed or performed during the hospital encounter of 08/15/21  Blood Culture ID Panel (Reflexed) (Collected: 08/15/2021  8:14 PM)  Result Value Ref Range   Enterococcus faecalis NOT DETECTED NOT DETECTED   Enterococcus Faecium NOT DETECTED NOT DETECTED   Listeria monocytogenes NOT DETECTED NOT DETECTED   Staphylococcus species DETECTED (A) NOT DETECTED   Staphylococcus aureus (BCID) NOT DETECTED NOT DETECTED   Staphylococcus epidermidis NOT DETECTED NOT DETECTED   Staphylococcus lugdunensis NOT DETECTED NOT DETECTED   Streptococcus species NOT DETECTED NOT DETECTED   Streptococcus agalactiae NOT DETECTED NOT DETECTED   Streptococcus pneumoniae NOT DETECTED NOT DETECTED   Streptococcus pyogenes NOT DETECTED NOT DETECTED   A.calcoaceticus-baumannii NOT DETECTED NOT DETECTED   Bacteroides fragilis NOT DETECTED NOT DETECTED   Enterobacterales NOT DETECTED NOT DETECTED   Enterobacter cloacae complex NOT DETECTED NOT DETECTED   Escherichia coli NOT DETECTED NOT DETECTED   Klebsiella aerogenes NOT DETECTED NOT DETECTED   Klebsiella oxytoca NOT DETECTED NOT DETECTED   Klebsiella pneumoniae NOT DETECTED NOT DETECTED   Proteus species NOT DETECTED NOT DETECTED   Salmonella species NOT DETECTED NOT DETECTED   Serratia marcescens NOT DETECTED NOT DETECTED   Haemophilus influenzae NOT DETECTED NOT DETECTED   Neisseria meningitidis NOT DETECTED NOT DETECTED   Pseudomonas aeruginosa NOT DETECTED NOT DETECTED   Stenotrophomonas maltophilia NOT DETECTED NOT DETECTED   Candida  albicans NOT DETECTED NOT DETECTED   Candida auris NOT DETECTED NOT DETECTED   Candida glabrata NOT DETECTED NOT DETECTED   Candida krusei NOT DETECTED NOT DETECTED   Candida parapsilosis NOT DETECTED NOT DETECTED   Candida tropicalis NOT DETECTED NOT DETECTED   Cryptococcus neoformans/gattii NOT DETECTED NOT DETECTED    JONMICHAEL BEADNELL 08/17/2021  2:41 PM

## 2021-08-18 ENCOUNTER — Inpatient Hospital Stay (HOSPITAL_COMMUNITY): Payer: Medicare HMO

## 2021-08-18 ENCOUNTER — Encounter (HOSPITAL_COMMUNITY): Payer: Self-pay | Admitting: Emergency Medicine

## 2021-08-18 DIAGNOSIS — J189 Pneumonia, unspecified organism: Secondary | ICD-10-CM | POA: Diagnosis present

## 2021-08-18 DIAGNOSIS — I10 Essential (primary) hypertension: Secondary | ICD-10-CM | POA: Diagnosis not present

## 2021-08-18 DIAGNOSIS — E1110 Type 2 diabetes mellitus with ketoacidosis without coma: Secondary | ICD-10-CM | POA: Diagnosis not present

## 2021-08-18 DIAGNOSIS — J918 Pleural effusion in other conditions classified elsewhere: Secondary | ICD-10-CM

## 2021-08-18 DIAGNOSIS — K7460 Unspecified cirrhosis of liver: Secondary | ICD-10-CM | POA: Diagnosis present

## 2021-08-18 DIAGNOSIS — K567 Ileus, unspecified: Secondary | ICD-10-CM | POA: Diagnosis present

## 2021-08-18 DIAGNOSIS — U071 COVID-19: Secondary | ICD-10-CM | POA: Diagnosis not present

## 2021-08-18 LAB — CBC WITH DIFFERENTIAL/PLATELET
Abs Immature Granulocytes: 0.71 10*3/uL — ABNORMAL HIGH (ref 0.00–0.07)
Basophils Absolute: 0.1 10*3/uL (ref 0.0–0.1)
Basophils Relative: 0 %
Eosinophils Absolute: 0 10*3/uL (ref 0.0–0.5)
Eosinophils Relative: 0 %
HCT: 40.9 % (ref 39.0–52.0)
Hemoglobin: 14 g/dL (ref 13.0–17.0)
Immature Granulocytes: 3 %
Lymphocytes Relative: 7 %
Lymphs Abs: 1.5 10*3/uL (ref 0.7–4.0)
MCH: 28.9 pg (ref 26.0–34.0)
MCHC: 34.2 g/dL (ref 30.0–36.0)
MCV: 84.5 fL (ref 80.0–100.0)
Monocytes Absolute: 2.1 10*3/uL — ABNORMAL HIGH (ref 0.1–1.0)
Monocytes Relative: 9 %
Neutro Abs: 18.2 10*3/uL — ABNORMAL HIGH (ref 1.7–7.7)
Neutrophils Relative %: 81 %
Platelets: 252 10*3/uL (ref 150–400)
RBC: 4.84 MIL/uL (ref 4.22–5.81)
RDW: 13.1 % (ref 11.5–15.5)
WBC: 22.6 10*3/uL — ABNORMAL HIGH (ref 4.0–10.5)
nRBC: 0 % (ref 0.0–0.2)

## 2021-08-18 LAB — RENAL FUNCTION PANEL
Albumin: 2.4 g/dL — ABNORMAL LOW (ref 3.5–5.0)
Anion gap: 11 (ref 5–15)
BUN: 8 mg/dL (ref 8–23)
CO2: 17 mmol/L — ABNORMAL LOW (ref 22–32)
Calcium: 7.8 mg/dL — ABNORMAL LOW (ref 8.9–10.3)
Chloride: 101 mmol/L (ref 98–111)
Creatinine, Ser: 0.57 mg/dL — ABNORMAL LOW (ref 0.61–1.24)
GFR, Estimated: >60 mL/min (ref >60)
Glucose, Bld: 217 mg/dL — ABNORMAL HIGH (ref 70–99)
Phosphorus: 1.5 mg/dL — ABNORMAL LOW (ref 2.5–4.6)
Potassium: 3.4 mmol/L — ABNORMAL LOW (ref 3.5–5.1)
Sodium: 129 mmol/L — ABNORMAL LOW (ref 135–145)

## 2021-08-18 LAB — D-DIMER, QUANTITATIVE: D-Dimer, Quant: 2.68 ug/mL-FEU — ABNORMAL HIGH (ref 0.00–0.50)

## 2021-08-18 LAB — GLUCOSE, CAPILLARY
Glucose-Capillary: 202 mg/dL — ABNORMAL HIGH (ref 70–99)
Glucose-Capillary: 225 mg/dL — ABNORMAL HIGH (ref 70–99)
Glucose-Capillary: 170 mg/dL — ABNORMAL HIGH (ref 70–99)
Glucose-Capillary: 146 mg/dL — ABNORMAL HIGH (ref 70–99)

## 2021-08-18 LAB — HEMOGLOBIN A1C
Hgb A1c MFr Bld: 10.8 % — ABNORMAL HIGH (ref 4.8–5.6)
Mean Plasma Glucose: 263.26 mg/dL

## 2021-08-18 LAB — C-REACTIVE PROTEIN: CRP: 25 mg/dL — ABNORMAL HIGH (ref <1.0)

## 2021-08-18 MED ORDER — BISACODYL 10 MG RE SUPP
10.0000 mg | Freq: Once | RECTAL | Status: AC
  Administered 2021-08-18: 10 mg via RECTAL
  Filled 2021-08-18: qty 1

## 2021-08-18 MED ORDER — IOHEXOL 9 MG/ML PO SOLN
ORAL | Status: AC
  Filled 2021-08-18: qty 1000

## 2021-08-18 MED ORDER — IPRATROPIUM-ALBUTEROL 20-100 MCG/ACT IN AERS
1.0000 | INHALATION_SPRAY | Freq: Four times a day (QID) | RESPIRATORY_TRACT | Status: DC
  Administered 2021-08-18 – 2021-08-23 (×19): 1 via RESPIRATORY_TRACT
  Filled 2021-08-18: qty 4

## 2021-08-18 MED ORDER — SODIUM CHLORIDE 0.9 % IV SOLN
2.0000 g | INTRAVENOUS | Status: DC
  Administered 2021-08-18: 2 g via INTRAVENOUS
  Filled 2021-08-18: qty 20

## 2021-08-18 MED ORDER — LIDOCAINE HCL (PF) 2 % IJ SOLN
INTRAMUSCULAR | Status: AC
  Filled 2021-08-18: qty 10

## 2021-08-18 MED ORDER — VANCOMYCIN HCL 1250 MG/250ML IV SOLN
1250.0000 mg | Freq: Two times a day (BID) | INTRAVENOUS | Status: DC
  Administered 2021-08-19 – 2021-08-23 (×10): 1250 mg via INTRAVENOUS
  Filled 2021-08-18 (×11): qty 250

## 2021-08-18 MED ORDER — IOHEXOL 350 MG/ML SOLN
100.0000 mL | Freq: Once | INTRAVENOUS | Status: AC | PRN
  Administered 2021-08-18: 100 mL via INTRAVENOUS

## 2021-08-18 MED ORDER — AMITRIPTYLINE HCL 25 MG PO TABS
50.0000 mg | ORAL_TABLET | Freq: Every day | ORAL | Status: DC
  Administered 2021-08-18 – 2021-08-26 (×9): 50 mg via ORAL
  Filled 2021-08-18 (×9): qty 2

## 2021-08-18 MED ORDER — VANCOMYCIN HCL 1500 MG/300ML IV SOLN
1500.0000 mg | Freq: Once | INTRAVENOUS | Status: AC
  Administered 2021-08-18: 1500 mg via INTRAVENOUS
  Filled 2021-08-18: qty 300

## 2021-08-18 MED ORDER — SODIUM CHLORIDE 0.9 % IV SOLN
500.0000 mg | INTRAVENOUS | Status: DC
  Administered 2021-08-18: 500 mg via INTRAVENOUS
  Filled 2021-08-18: qty 5

## 2021-08-18 NOTE — Procedures (Signed)
PreOperative Dx: LEFT pleural effusion Postoperative Dx: LEFT pleural effusion Procedure:   US guided Unsuccessful LEFT thoracentesis Radiologist:  Thornton Papas Anesthesia:  10 ml of 1% lidocaine Specimen:  None EBL:   < 1 ml Complications: None.  No PTX. No perisplenic fluid

## 2021-08-18 NOTE — Progress Notes (Addendum)
Pharmacy Antibiotic Note  Troy [name] is a 61 y.o. male admitted on 08/15/2021 Covid +ve on Paxlovid. Pharmacy has been consulted for Vancomycin dosing for ongoing leukocytosis, questionable pneumonia. MD dosing Azith/Rocephin.   Plan: Vancomycin 1500mg  IV x 1, Then 1250mg  IV q 12h Monitor renal fxn, cultures, and clinical status  Height: 5\' 10"  (177.8 cm) Weight: 83.2 kg (183 lb 6.8 oz) IBW/kg (Calculated) : 73 Based on Scr 0.8/CrCl 100; Vd 0.72, Ke 0.088; calc AUC 476  Temp (24hrs), Avg:98.1 F (36.7 C), Min:97.6 F (36.4 C), Max:98.5 F (36.9 C)  Recent Labs  Lab 08/13/21 0158 08/15/21 2014 08/15/21 2224 08/15/21 2229 08/16/21 0405 08/16/21 0611 08/16/21 1025 08/16/21 1458 08/17/21 0457 08/18/21 0434 08/18/21 0842  WBC 10.9* 22.9*  --   --  17.7*  --   --   --  16.4* 22.6*  --   CREATININE 0.60* 0.64  --    < >  --  0.57* 0.56* 0.49* 0.56*  --  0.57*  LATICACIDVEN  --  2.2* 2.0*  --   --   --   --   --   --   --   --    < > = values in this interval not displayed.    Estimated Creatinine Clearance: 100.1 mL/min (A) (by C-G formula based on SCr of 0.57 mg/dL (L)).    Allergies  Allergen Reactions   Hydromorphone Hcl Er Anaphylaxis and Other (See Comments)    Allergic to DYE in extended-release tablet, can tolerate other forms of hydromorphone   Tapentadol Anaphylaxis, Swelling and Other (See Comments)    THROAT ANGIOEDEMA Nucynta [Tapentadol Hydrochloride]   Exalamide Other (See Comments)    UNSPECIFIED REACTION     Antimicrobials this admission: 2/28 Vanc >> 2/28 CTX >> 2/28 Azith >>  Dose adjustments this admission:  Microbiology results: 2/28 BCx >> 2/26 MRSA PCR: Positive 2/25 BCx >> Staph Capatis   Thank you for allowing pharmacy to be a part of this patients care.  Lorenso Courier [facility] 08/18/2021 3:27 PM

## 2021-08-18 NOTE — Progress Notes (Addendum)
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 There is no pneumothorax. Abdomen: There is significant gaseous distention of the small and large bowel throughout the abdomen with a loop measuring up to 6.9 cm in the right lower quadrant. This is significantly increased compared to the CT from 08/13/2021. No free intraperitoneal air is seen. There is no abnormal soft tissue calcification. IMPRESSION: 1. Significant gaseous distention of small and large bowel throughout the abdomen has increased since 08/13/2021. Finding may reflect ileus or obstruction. Consider repeat CT for further evaluation. 2.  Left pleural effusion with adjacent airspace disease is new/significantly worsened since 08/15/2021. Findings could reflect pneumonia and parapneumonic effusion in the correct clinical setting. Electronically Signed   By: Valetta Mole M.D.   On: 08/18/2021 08:11   US  THORACENTESIS ASP PLEURAL SPACE W/IMG GUIDE  Result Date: 08/18/2021 Lavonia Dana, MD     08/18/2021  3:22 PM PreOperative Dx: LEFT pleural effusion Postoperative Dx: LEFT pleural effusion Procedure:   US guided Unsuccessful LEFT thoracentesis Radiologist:  Thornton Papas Anesthesia:  10 ml of 1% lidocaine Specimen:  None EBL:   < 1 ml Complications: None.  No PTX. No perisplenic fluid      Scheduled Meds:  amitriptyline  50 mg Oral QHS   aspirin EC  81 mg Oral Q breakfast   Chlorhexidine Gluconate Cloth  6 each Topical Daily   Chlorhexidine Gluconate Cloth  6 each Topical Q0600   enoxaparin (LOVENOX) injection  40 mg Subcutaneous Q24H   gabapentin  300 mg Oral QID   insulin aspart  0-20 Units Subcutaneous TID WC   insulin aspart  0-5 Units Subcutaneous QHS   insulin aspart  3 Units Subcutaneous TID WC   insulin glargine-yfgn  8 Units Subcutaneous BID   Ipratropium-Albuterol  1 puff Inhalation QID   lidocaine HCl (PF)       lidocaine HCl (PF)       living well with diabetes book   Does not apply Once   mupirocin ointment  1 application Nasal BID   mupirocin ointment  1 application Nasal BID   nirmatrelvir/ritonavir EUA  3 tablet Oral BID   pantoprazole  40 mg Oral Daily   potassium chloride  40 mEq Oral BID   Continuous Infusions:  sodium chloride 100 mL/hr at 08/17/21 1810   azithromycin 500 mg (08/18/21 1400)   cefTRIAXone (ROCEPHIN)  IV 2 g (08/18/21 1355)   [START ON 08/19/2021] vancomycin     vancomycin      LOS: 3 days   Roxan Hockey M.D on 08/18/2021 at 3:49 PM  Go to [email or web address] - for contact info  [facility] - Office  [phone]  If 7PM-7AM, please contact night-coverage [email or web address] Password [facility] 08/18/2021, 3:49 PM

## 2021-08-19 ENCOUNTER — Inpatient Hospital Stay (HOSPITAL_COMMUNITY): Payer: Medicare HMO

## 2021-08-19 DIAGNOSIS — J90 Pleural effusion, not elsewhere classified: Secondary | ICD-10-CM | POA: Diagnosis not present

## 2021-08-19 DIAGNOSIS — E1310 Other specified diabetes mellitus with ketoacidosis without coma: Secondary | ICD-10-CM | POA: Diagnosis not present

## 2021-08-19 DIAGNOSIS — J189 Pneumonia, unspecified organism: Secondary | ICD-10-CM

## 2021-08-19 DIAGNOSIS — J449 Chronic obstructive pulmonary disease, unspecified: Secondary | ICD-10-CM | POA: Diagnosis not present

## 2021-08-19 DIAGNOSIS — Z148 Genetic carrier of other disease: Secondary | ICD-10-CM

## 2021-08-19 DIAGNOSIS — U071 COVID-19: Secondary | ICD-10-CM | POA: Diagnosis not present

## 2021-08-19 DIAGNOSIS — K7460 Unspecified cirrhosis of liver: Secondary | ICD-10-CM | POA: Diagnosis not present

## 2021-08-19 LAB — CULTURE, BLOOD (ROUTINE X 2): Special Requests: ADEQUATE

## 2021-08-19 LAB — CBC WITH DIFFERENTIAL/PLATELET
Abs Immature Granulocytes: 0.34 10*3/uL — ABNORMAL HIGH (ref 0.00–0.07)
Basophils Absolute: 0.1 10*3/uL (ref 0.0–0.1)
Basophils Relative: 1 %
Eosinophils Absolute: 0 10*3/uL (ref 0.0–0.5)
Eosinophils Relative: 0 %
HCT: 36.2 % — ABNORMAL LOW (ref 39.0–52.0)
Hemoglobin: 12.7 g/dL — ABNORMAL LOW (ref 13.0–17.0)
Immature Granulocytes: 2 %
Lymphocytes Relative: 9 %
Lymphs Abs: 1.7 10*3/uL (ref 0.7–4.0)
MCH: 29.2 pg (ref 26.0–34.0)
MCHC: 35.1 g/dL (ref 30.0–36.0)
MCV: 83.2 fL (ref 80.0–100.0)
Monocytes Absolute: 1.9 10*3/uL — ABNORMAL HIGH (ref 0.1–1.0)
Monocytes Relative: 10 %
Neutro Abs: 15.2 10*3/uL — ABNORMAL HIGH (ref 1.7–7.7)
Neutrophils Relative %: 78 %
Platelets: 222 10*3/uL (ref 150–400)
RBC: 4.35 MIL/uL (ref 4.22–5.81)
RDW: 12.9 % (ref 11.5–15.5)
WBC: 19.2 10*3/uL — ABNORMAL HIGH (ref 4.0–10.5)
nRBC: 0 % (ref 0.0–0.2)

## 2021-08-19 LAB — COMPREHENSIVE METABOLIC PANEL
ALT: 14 U/L (ref 0–44)
AST: 20 U/L (ref 15–41)
Albumin: 2 g/dL — ABNORMAL LOW (ref 3.5–5.0)
Alkaline Phosphatase: 74 U/L (ref 38–126)
Anion gap: 8 (ref 5–15)
BUN: <5 mg/dL — ABNORMAL LOW (ref 8–23)
CO2: 21 mmol/L — ABNORMAL LOW (ref 22–32)
Calcium: 7.4 mg/dL — ABNORMAL LOW (ref 8.9–10.3)
Chloride: 100 mmol/L (ref 98–111)
Creatinine, Ser: 0.52 mg/dL — ABNORMAL LOW (ref 0.61–1.24)
GFR, Estimated: >60 mL/min (ref >60)
Glucose, Bld: 144 mg/dL — ABNORMAL HIGH (ref 70–99)
Potassium: 3.4 mmol/L — ABNORMAL LOW (ref 3.5–5.1)
Sodium: 129 mmol/L — ABNORMAL LOW (ref 135–145)
Total Bilirubin: 0.6 mg/dL (ref 0.3–1.2)
Total Protein: 5.3 g/dL — ABNORMAL LOW (ref 6.5–8.1)

## 2021-08-19 LAB — GLUCOSE, CAPILLARY
Glucose-Capillary: 137 mg/dL — ABNORMAL HIGH (ref 70–99)
Glucose-Capillary: 136 mg/dL — ABNORMAL HIGH (ref 70–99)
Glucose-Capillary: 116 mg/dL — ABNORMAL HIGH (ref 70–99)
Glucose-Capillary: 111 mg/dL — ABNORMAL HIGH (ref 70–99)
Glucose-Capillary: 112 mg/dL — ABNORMAL HIGH (ref 70–99)

## 2021-08-19 LAB — C-REACTIVE PROTEIN: CRP: 22.1 mg/dL — ABNORMAL HIGH (ref <1.0)

## 2021-08-19 LAB — D-DIMER, QUANTITATIVE: D-Dimer, Quant: 3.99 ug/mL-FEU — ABNORMAL HIGH (ref 0.00–0.50)

## 2021-08-19 MED ORDER — SORBITOL 70 % SOLN
960.0000 mL | TOPICAL_OIL | Freq: Once | ORAL | Status: DC
  Filled 2021-08-19: qty 473

## 2021-08-19 MED ORDER — SODIUM CHLORIDE 0.9 % IV SOLN
3.0000 g | Freq: Four times a day (QID) | INTRAVENOUS | Status: DC
  Administered 2021-08-19 – 2021-08-23 (×17): 3 g via INTRAVENOUS
  Filled 2021-08-19 (×22): qty 8

## 2021-08-19 NOTE — Progress Notes (Addendum)
Per report from Nurse tech, patient had had a large, brown, mushy stool earlier. Enema was ordered by Dr Roderic Palau. Writer updated Dr Roderic Palau and made aware of stool. Enema not given at this time per Dr Roderic Palau. ?

## 2021-08-19 NOTE — Consult Note (Signed)
[facility]   Patient name: Troy [name] Admit date: 08/15/2021  DOB: [birth year] LOS: 4  MRN: [id number] Consult date: 08/19/2021  Referring provider: Dr. Kerry Hough, [facility]: Pleural effusion    History:  61 yo male smoker presented to [facility] ER with weakness, fatigue, nausea, vomiting and diarrhea.  Found to be positive for COVID 19 infection.  Also found to have DKA and pneumonia.  Chest xray showed loculated left pleural effusion with left base consolidation/atelectasis.  IR consulted to assess for left thoracentesis but was unsuccessful.  PCCM consulted to assist with assessment of pleural effusion.  Past medical history:  DM type 2, PAD s/p Lt AKA, Diastolic CHF, GERD, HTN, HLD, Anxiety, Depression, Fatty liver, Nephrolithiasis, PTSD, Insomnia, Neuropathy  Significant events:  2/25 admit 2/28 IR attempted Lt thoracentesis but unsuccessful  Studies:  PFT 06/07/16 >> FEV1 2.52 (63%), FEV1% 84, TLC 5.26 (71%), DLCO 73% A1AT 06/07/16 >> 154, MS Echo 02/12/21 >> EF 70 to 75%, mod LVH, grade 1 DD, mild LA dilation CT angio chest 08/18/21 >> stable ventricular aneurysm, multiloculated moderate Lt pleural effusion with LLL ASD, changes of cirrhosis  Micro:  COVID 2/23 >> Positive Blood 2/25 >> Staph capitis (likely contaminate) Blood 2/28 >>   Lines:     Antibiotics:  Remdesivir 2/25 Paxlovid 2/26 >> Vancomycin 2/28 >>  Rocephin 2/28 Zithromax 2/28  Unasyn 3/01 >>   Consults:      Interim history:  He has cough.  Feels congested, but having trouble bringing up sputum.  Has soreness in chest from coughing.  Not needing supplemental oxygen.  Feels bloated.  Passing flatus, but no recent bowel movements.  Vital signs:  BP 134/90   Pulse (!) 126   Temp 99.9 F (37.7 C) (Oral)   Resp (!) 25   Ht 5\' 10"  (1.778 m)   Wt 87.2 kg   SpO2 95%   BMI 27.58 kg/m   Intake/output:  I/O last 3 completed shifts: In: 632.5  [P.O.:480; IV Piggyback:152.5] Out: 600 [Urine:600]   Physical exam:   General - alert Eyes - pupils reactive ENT - no sinus tenderness, no stridor Cardiac - regular rate/rhythm, no murmur Chest - decreased BS Lt base with faint rales Abdomen - soft, mild distention, decreased bowel sounds Extremities - Lt AKA Skin - no rashes Neuro - normal strength, moves extremities, follows commands Psych - normal mood and behavior  Best practice:   DVT - lovenox SUP - protonix Nutrition - full liquid   Assessment/plan:   Loculated Lt pleural effusion with pneumonia and COVID 19 positive. - pneumonia and effusion more likely bacterial superinfection rather than from COVID - continue ABx, paxolovid - he will need pig tail catheter placement and pleurolytic therapy; will need to go to [facility] for further management of this - if these therapies are unsuccessful, then he would evaluation by thoracic surgery  COPD with MS alpha 1 genotype. - previously seen by Dr. 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 - per primary team  D/w Dr. Kerry Hough  Resolved hospital problems:  DKA  Goals of care/Family discussions:  Code status: full   Labs:   CMP Latest Ref Rng & Units 08/19/2021 08/18/2021 08/17/2021  Glucose 70 - 99 mg/dL 147(W) 295(A) 213(Y)  BUN 8 - 23 mg/dL <8(M) 8 9  Creatinine 5.78 - 1.24 mg/dL 4.69(G) 2.95(M) 8.41(L)  Sodium 135 -  145 mmol/L 129(L) 129(L) 125(L)  Potassium 3.5 - 5.1 mmol/L 3.4(L) 3.4(L) 3.9  Chloride 98 - 111 mmol/L 100 101 100  CO2 22 - 32 mmol/L 21(L) 17(L) 19(L)  Calcium 8.9 - 10.3 mg/dL 7.4(L) 7.8(L) 7.5(L)  Total Protein 6.5 - 8.1 g/dL 5.3(L) - -  Total Bilirubin 0.3 - 1.2 mg/dL 0.6 - -  Alkaline Phos 38 - 126 U/L 74 - -  AST 15 - 41 U/L 20 - -  ALT 0 - 44 U/L 14 - -    CBC Latest Ref Rng & Units 08/19/2021 08/18/2021 08/17/2021  WBC 4.0 - 10.5  K/uL 19.2(H) 22.6(H) 16.4(H)  Hemoglobin 13.0 - 17.0 g/dL 12.7(L) 14.0 13.4  Hematocrit 39.0 - 52.0 % 36.2(L) 40.9 37.8(L)  Platelets 150 - 400 K/uL 222 252 164    ABG    Component Value Date/Time   TCO2 22 02/22/2009 0224    CBG (last 3)  Recent Labs    08/18/21 2128 08/19/21 0736 08/19/21 1157  GLUCAP 146* 137* 136*     Past surgical history:  He  has a past surgical history that includes Foot Amputation (Bilateral); Tonsillectomy; Lithotripsy; Cardiac catheterization (N/A, 01/22/2016); Tendon lengthening (Bilateral); I & D extremity (Left, 12/15/2017); Amputation (Left, 01/03/2018); Below knee leg amputation (Left, 01/25/2018); Amputation (Left, 01/25/2018); Amputation toe (Right, 07/17/2019); I & D extremity (Left, 07/25/2020); I & D extremity (Left, 08/22/2020); Amputation (Left, 01/21/2021); and Application if wound vac (Left, 01/21/2021).  Social history:  He  reports that he has been smoking cigarettes. He has a 30.00 pack-year smoking history. He has never used smokeless tobacco. He reports that he does not drink alcohol and does not use drugs.   Review of systems:  Reviewed and negative  Family history:  His family history includes Diabetes in his sister and sister; Heart attack (age of onset: 54) in his brother; Heart attack (age of onset: 33) in his brother; Hypertension in his brother and sister; Leukemia (age of onset: 47) in his mother; Lung cancer (age of onset: 42) in his father; Other in his brother; Stroke in his sister.    Medications:   No current facility-administered medications on file prior to encounter.   Current Outpatient Medications on File Prior to Encounter  Medication Sig   acetaminophen (TYLENOL) 325 MG tablet Take 2 tablets (650 mg total) by mouth every 6 (six) hours as needed for mild pain (or Fever >/= 101).   albuterol (VENTOLIN HFA) 108 (90 Base) MCG/ACT inhaler Inhale 2 puffs into the lungs every 4 (four) hours as needed for wheezing or shortness of  breath.   ipratropium (ATROVENT) 0.02 % nebulizer solution Take 2.5 mLs (0.5 mg total) by nebulization 4 (four) times daily as needed for wheezing or shortness of breath.   naloxone (NARCAN) nasal spray 4 mg/0.1 mL Place 1 spray into the nose once.   oxyCODONE-acetaminophen (PERCOCET) 10-325 MG tablet Take 1 tablet by mouth every 4 (four) hours as needed for pain.   amitriptyline (ELAVIL) 150 MG tablet Take 150 mg by mouth at bedtime. (Patient not taking: Reported on 08/16/2021)   amLODipine (NORVASC) 10 MG tablet Take 1 tablet (10 mg total) by mouth daily. (Patient not taking: Reported on 08/16/2021)   ascorbic acid (VITAMIN C) 1000 MG tablet Take 1 tablet (1,000 mg total) by mouth daily. (Patient not taking: Reported on 08/16/2021)   aspirin EC 81 MG EC tablet Take 1 tablet (81 mg total) by mouth daily with breakfast. Swallow whole. (Patient not taking:  Reported on 08/16/2021)   Dapagliflozin-metFORMIN HCl ER 10-998 MG TB24 Take 1 tablet by mouth daily. (Patient not taking: Reported on 08/16/2021)   Dulaglutide 1.5 MG/0.5ML SOPN Inject 1.5 mg into the skin every Sunday. (Patient not taking: Reported on 08/16/2021)   gabapentin (NEURONTIN) 300 MG capsule Take 300 mg by mouth 4 (four) times daily. (Patient not taking: Reported on 08/16/2021)   glucose blood (ONETOUCH VERIO) test strip USE TO CHECK BLOOD SUGAR 1-2 TIME(S) DAILY.   Insulin Degludec (TRESIBA) 100 UNIT/ML SOLN Inject 24 Units into the skin in the morning and at bedtime. (Patient not taking: Reported on 08/16/2021)   Insulin Pen Needle (PEN NEEDLES) 32G X 4 MM MISC by Does not apply route.   mupirocin ointment (BACTROBAN) 2 % Place into the nose 2 (two) times daily. (Patient not taking: Reported on 08/16/2021)   nicotine (NICODERM CQ - DOSED IN MG/24 HOURS) 14 mg/24hr patch Place 1 patch (14 mg total) onto the skin daily. (Patient not taking: Reported on 08/16/2021)   omeprazole (PRILOSEC) 40 MG capsule Take 40 mg by mouth daily. (Patient not  taking: Reported on 08/16/2021)   ondansetron (ZOFRAN-ODT) 4 MG disintegrating tablet Take 1 tablet (4 mg total) by mouth every 8 (eight) hours as needed for nausea or vomiting. (Patient not taking: Reported on 08/16/2021)   polyethylene glycol (MIRALAX / GLYCOLAX) 17 g packet Take 17 g by mouth daily as needed for mild constipation. (Patient not taking: Reported on 08/16/2021)   potassium chloride SA (KLOR-CON M) 20 MEQ tablet Take 2 tablets (40 mEq total) by mouth daily. (Patient not taking: Reported on 08/16/2021)   tiZANidine (ZANAFLEX) 4 MG tablet Take 1 tablet (4 mg total) by mouth at bedtime. (Patient not taking: Reported on 08/16/2021)   traZODone (DESYREL) 50 MG tablet Take 50-100 mg by mouth at bedtime. (Patient not taking: Reported on 08/16/2021)     Signature:  Coralyn Helling, MD [facility] Pager - [phone] 08/19/2021, 3:00 PM

## 2021-08-19 NOTE — Progress Notes (Signed)
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   DG ABD ACUTE 2+V W 1V CHEST  Result Date: 08/19/2021 CLINICAL DATA:  Pneumonia and CHF, COVID positive EXAM: DG ABDOMEN ACUTE WITH 1 VIEW CHEST COMPARISON:  08/18/2021 FINDINGS: Similar left effusion extending laterally and dense left lower lobe collapse/consolidation. No free air. Diffuse similar gaseous distension of the small and large bowel, little interval change. Ileus is favored. Degenerative changes noted of the spine. Aortoiliac atherosclerosis noted. IMPRESSION: Similar diffuse gaseous distention of the small and  large bowel compatible with ileus. Persistent left effusion and left lower lobe dense collapse/consolidation. Aortic Atherosclerosis (ICD10-I70.0). Electronically Signed   By: Jerilynn Mages.  Shick M.D.   On: 08/19/2021 08:11   DG ABD ACUTE 2+V W 1V CHEST  Result Date:  08/18/2021 CLINICAL DATA:  COVID-19, diabetic ketoacidosis EXAM: DG ABDOMEN ACUTE WITH 1 VIEW CHEST COMPARISON:  Chest radiograph 08/15/2021, CT abdomen/pelvis 08/13/2021 FINDINGS: Chest: The cardiomediastinal silhouette is stable. A left pleural effusion has significantly increased in size, layering along the left chest wall. There is worsened aeration of the left base. The right lung is clear. There is no right effusion. There is no pneumothorax. Abdomen: There is significant gaseous distention of the small and large bowel throughout the abdomen with a loop measuring up to 6.9 cm in the right lower quadrant. This is significantly increased compared to the CT from 08/13/2021. No free intraperitoneal air is seen. There is no abnormal soft tissue calcification. IMPRESSION: 1. Significant gaseous distention of small and large bowel throughout the abdomen has increased since 08/13/2021. Finding may reflect ileus or obstruction. Consider repeat CT for further evaluation. 2. Left pleural effusion with adjacent airspace disease is new/significantly worsened since 08/15/2021. Findings could reflect pneumonia and parapneumonic effusion in the correct clinical setting. Electronically Signed   By: Valetta Mole M.D.   On: 08/18/2021 08:11   US THORACENTESIS ASP PLEURAL SPACE W/IMG GUIDE  Result Date: 08/18/2021 Lavonia Dana, MD     08/18/2021  3:22 PM PreOperative Dx: LEFT pleural effusion Postoperative Dx: LEFT pleural effusion Procedure:   US guided Unsuccessful LEFT thoracentesis Radiologist:  Thornton Papas Anesthesia:  10 ml of 1% lidocaine Specimen:  None EBL:   < 1 ml Complications: None.  No PTX. No perisplenic fluid      Scheduled Meds:  amitriptyline  50 mg Oral QHS   aspirin EC  81 mg Oral Q breakfast   Chlorhexidine Gluconate Cloth  6 each Topical Daily   Chlorhexidine Gluconate Cloth  6 each Topical Q0600   enoxaparin (LOVENOX) injection  40 mg Subcutaneous Q24H   gabapentin  300 mg Oral QID   insulin aspart   0-20 Units Subcutaneous TID WC   insulin aspart  0-5 Units Subcutaneous QHS   insulin aspart  3 Units Subcutaneous TID WC   insulin glargine-yfgn  8 Units Subcutaneous BID   Ipratropium-Albuterol  1 puff Inhalation QID   living well with diabetes book   Does not apply Once   mupirocin ointment  1 application Nasal BID   mupirocin ointment  1 application Nasal BID   nirmatrelvir/ritonavir EUA  3 tablet Oral BID   pantoprazole  40 mg Oral Daily   potassium chloride  40 mEq Oral BID   Continuous Infusions:  sodium chloride 100 mL/hr at 08/19/21 0748   ampicillin-sulbactam (UNASYN) IV Stopped (08/19/21 1320)   vancomycin 1,250 mg (08/19/21 1603)    LOS: 4 days   Kathie Dike M.D on 08/19/2021 at 7:11 PM  Go to [email or web address] - for contact info  [facility] - Office  [phone]  If 7PM-7AM, please contact night-coverage [email or web address]  08/19/2021, 7:11 PM

## 2021-08-20 ENCOUNTER — Inpatient Hospital Stay (HOSPITAL_COMMUNITY): Payer: Medicare HMO

## 2021-08-20 ENCOUNTER — Encounter (HOSPITAL_COMMUNITY): Payer: Self-pay

## 2021-08-20 DIAGNOSIS — K567 Ileus, unspecified: Secondary | ICD-10-CM

## 2021-08-20 DIAGNOSIS — E1110 Type 2 diabetes mellitus with ketoacidosis without coma: Secondary | ICD-10-CM

## 2021-08-20 DIAGNOSIS — U071 COVID-19: Secondary | ICD-10-CM | POA: Diagnosis not present

## 2021-08-20 DIAGNOSIS — K7460 Unspecified cirrhosis of liver: Secondary | ICD-10-CM | POA: Diagnosis not present

## 2021-08-20 DIAGNOSIS — E1310 Other specified diabetes mellitus with ketoacidosis without coma: Secondary | ICD-10-CM | POA: Diagnosis not present

## 2021-08-20 DIAGNOSIS — E876 Hypokalemia: Secondary | ICD-10-CM

## 2021-08-20 DIAGNOSIS — J189 Pneumonia, unspecified organism: Secondary | ICD-10-CM | POA: Diagnosis not present

## 2021-08-20 DIAGNOSIS — J918 Pleural effusion in other conditions classified elsewhere: Secondary | ICD-10-CM

## 2021-08-20 DIAGNOSIS — E871 Hypo-osmolality and hyponatremia: Secondary | ICD-10-CM

## 2021-08-20 LAB — CULTURE, BLOOD (ROUTINE X 2)
Culture: NO GROWTH
Special Requests: ADEQUATE

## 2021-08-20 LAB — CBC WITH DIFFERENTIAL/PLATELET
Abs Immature Granulocytes: 0.31 10*3/uL — ABNORMAL HIGH (ref 0.00–0.07)
Basophils Absolute: 0.1 10*3/uL (ref 0.0–0.1)
Basophils Relative: 0 %
Eosinophils Absolute: 0.1 10*3/uL (ref 0.0–0.5)
Eosinophils Relative: 0 %
HCT: 36.3 % — ABNORMAL LOW (ref 39.0–52.0)
Hemoglobin: 12.7 g/dL — ABNORMAL LOW (ref 13.0–17.0)
Immature Granulocytes: 2 %
Lymphocytes Relative: 8 %
Lymphs Abs: 1.5 10*3/uL (ref 0.7–4.0)
MCH: 28.9 pg (ref 26.0–34.0)
MCHC: 35 g/dL (ref 30.0–36.0)
MCV: 82.5 fL (ref 80.0–100.0)
Monocytes Absolute: 1.6 10*3/uL — ABNORMAL HIGH (ref 0.1–1.0)
Monocytes Relative: 8 %
Neutro Abs: 15.6 10*3/uL — ABNORMAL HIGH (ref 1.7–7.7)
Neutrophils Relative %: 82 %
Platelets: 225 10*3/uL (ref 150–400)
RBC: 4.4 MIL/uL (ref 4.22–5.81)
RDW: 13 % (ref 11.5–15.5)
WBC: 19.1 10*3/uL — ABNORMAL HIGH (ref 4.0–10.5)
nRBC: 0 % (ref 0.0–0.2)

## 2021-08-20 LAB — PHOSPHORUS: Phosphorus: 2.4 mg/dL — ABNORMAL LOW (ref 2.5–4.6)

## 2021-08-20 LAB — COMPREHENSIVE METABOLIC PANEL
ALT: 13 U/L (ref 0–44)
AST: 18 U/L (ref 15–41)
Albumin: 1.7 g/dL — ABNORMAL LOW (ref 3.5–5.0)
Alkaline Phosphatase: 83 U/L (ref 38–126)
Anion gap: 12 (ref 5–15)
BUN: <5 mg/dL — ABNORMAL LOW (ref 8–23)
CO2: 20 mmol/L — ABNORMAL LOW (ref 22–32)
Calcium: 7.3 mg/dL — ABNORMAL LOW (ref 8.9–10.3)
Chloride: 99 mmol/L (ref 98–111)
Creatinine, Ser: 0.53 mg/dL — ABNORMAL LOW (ref 0.61–1.24)
GFR, Estimated: >60 mL/min (ref >60)
Glucose, Bld: 171 mg/dL — ABNORMAL HIGH (ref 70–99)
Potassium: 3.7 mmol/L (ref 3.5–5.1)
Sodium: 131 mmol/L — ABNORMAL LOW (ref 135–145)
Total Bilirubin: 0.9 mg/dL (ref 0.3–1.2)
Total Protein: 4.9 g/dL — ABNORMAL LOW (ref 6.5–8.1)

## 2021-08-20 LAB — GLUCOSE, CAPILLARY
Glucose-Capillary: 199 mg/dL — ABNORMAL HIGH (ref 70–99)
Glucose-Capillary: 156 mg/dL — ABNORMAL HIGH (ref 70–99)
Glucose-Capillary: 123 mg/dL — ABNORMAL HIGH (ref 70–99)
Glucose-Capillary: 137 mg/dL — ABNORMAL HIGH (ref 70–99)

## 2021-08-20 LAB — MAGNESIUM: Magnesium: 1.2 mg/dL — ABNORMAL LOW (ref 1.7–2.4)

## 2021-08-20 LAB — D-DIMER, QUANTITATIVE
D-Dimer, Quant: 4.58 ug/mL-FEU — ABNORMAL HIGH (ref 0.00–0.50)
D-Dimer, Quant: 4.32 ug/mL-FEU — ABNORMAL HIGH (ref 0.00–0.50)

## 2021-08-20 LAB — C-REACTIVE PROTEIN
CRP: 23.2 mg/dL — ABNORMAL HIGH (ref <1.0)
CRP: 22.7 mg/dL — ABNORMAL HIGH (ref <1.0)

## 2021-08-20 LAB — FERRITIN: Ferritin: 437 ng/mL — ABNORMAL HIGH (ref 24–336)

## 2021-08-20 MED ORDER — POLYETHYLENE GLYCOL 3350 17 G PO PACK
17.0000 g | PACK | Freq: Every day | ORAL | Status: DC
  Administered 2021-08-20: 17 g via ORAL
  Filled 2021-08-20: qty 1

## 2021-08-20 MED ORDER — MAGNESIUM SULFATE 4 GM/100ML IV SOLN
4.0000 g | Freq: Once | INTRAVENOUS | Status: AC
  Administered 2021-08-20: 4 g via INTRAVENOUS
  Filled 2021-08-20: qty 100

## 2021-08-20 MED ORDER — SODIUM CHLORIDE 0.9 % IV SOLN: INTRAVENOUS | Status: DC | PRN

## 2021-08-20 MED ORDER — FENTANYL CITRATE (PF) 100 MCG/2ML IJ SOLN
INTRAMUSCULAR | Status: AC
  Filled 2021-08-20: qty 4

## 2021-08-20 MED ORDER — POTASSIUM PHOSPHATES 15 MMOLE/5ML IV SOLN
30.0000 mmol | Freq: Once | INTRAVENOUS | Status: AC
  Administered 2021-08-20: 30 mmol via INTRAVENOUS
  Filled 2021-08-20: qty 10

## 2021-08-20 MED ORDER — GUAIFENESIN ER 600 MG PO TB12
1200.0000 mg | ORAL_TABLET | Freq: Two times a day (BID) | ORAL | Status: AC
  Administered 2021-08-20 – 2021-08-26 (×13): 1200 mg via ORAL
  Filled 2021-08-20 (×14): qty 2

## 2021-08-20 MED ORDER — FENTANYL CITRATE (PF) 100 MCG/2ML IJ SOLN
INTRAMUSCULAR | Status: AC | PRN
  Administered 2021-08-20 (×2): 25 ug via INTRAVENOUS

## 2021-08-20 MED ORDER — MIDAZOLAM HCL 2 MG/2ML IJ SOLN
INTRAMUSCULAR | Status: AC | PRN
Start: 2021-08-20 — End: 2021-08-20
  Administered 2021-08-20: .5 mg via INTRAVENOUS

## 2021-08-20 MED ORDER — MIDAZOLAM HCL 2 MG/2ML IJ SOLN
INTRAMUSCULAR | Status: AC
  Filled 2021-08-20: qty 4

## 2021-08-20 MED ORDER — MOMETASONE FURO-FORMOTEROL FUM 200-5 MCG/ACT IN AERO
2.0000 | INHALATION_SPRAY | Freq: Two times a day (BID) | RESPIRATORY_TRACT | Status: DC
  Administered 2021-08-20 – 2021-08-27 (×15): 2 via RESPIRATORY_TRACT
  Filled 2021-08-20: qty 8.8

## 2021-08-20 MED ORDER — IPRATROPIUM-ALBUTEROL 0.5-2.5 (3) MG/3ML IN SOLN: 3.0000 mL | RESPIRATORY_TRACT | Status: DC | PRN

## 2021-08-20 MED ORDER — LIDOCAINE HCL 1 % IJ SOLN
INTRAMUSCULAR | Status: AC
  Filled 2021-08-20: qty 10

## 2021-08-20 NOTE — Progress Notes (Signed)
Inpatient Diabetes Program Recommendations ? ?AACE/ADA: New Consensus Statement on Inpatient Glycemic Control (2015) ? ?Target Ranges:  Prepandial:   less than 140 mg/dL ?     Peak postprandial:   less than 180 mg/dL (1-2 hours) ?     Critically ill patients:  140 - 180 mg/dL  ? ?Lab Results  ?Component Value Date  ? GLUCAP 199 (H) 08/20/2021  ? HGBA1C 10.8 (H) 08/18/2021  ? ? ?Review of Glycemic Control ? ?Diabetes history: DM 2 ?Outpatient Diabetes medications: dapagliflozin-metformin 01-2504 mg Daily, Trulicity 1.5 mg Weekly, Tresiba 24 units bid (pt reportedly not taking any of his DM medications) ?Current orders for Inpatient glycemic control:  ?Semglee 8 units bid ?Novolog 0-20 units tid + hs ?Novolog 3 units tid meal coverage ? ?Inpatient Diabetes Program Recommendations:   ? ?Spoke with pt and daughter at bedside. Per pt he does not check glucose often does not take insulin all the time and does not have a current doctor. Pt denies depression, however, daughter in the room disagrees. Pt had his eyes closed for part on the conversation.  Daughter mentioned "you know he is ignoring you right?" ? ?I made eye contact with pt and encouraged glucose control and follow up. I expressed understanding of pt being tired of taking insulin and CBGs at home. Encouraged self care. Daughter reports wife does help pt with insulin, however, when pt does not want to take the insulin or CBGs there is nothing they can do to persuade him. ? ?Pt will ultimately want to participate in Diabetes care to have better control of glucose trends and A1c at home. Encouraged pt. ? ?Thanks, ? ?Tama Headings RN, MSN, BC-ADM ?Inpatient Diabetes Coordinator ?Team Pager [phone] (8a-5p) ? ? ? ?

## 2021-08-20 NOTE — Procedures (Signed)
Interventional Radiology Procedure Note ? ?Procedure: 14 fr left chest tube placed with CT guidance ? ?Indication: Left empyema ? ?Findings: Please refer to procedural dictation for full description. ? ?Complications: None ? ?EBL: < 10 mL ? ?Miachel Roux, MD ?[phone] ? ? ?

## 2021-08-20 NOTE — Progress Notes (Signed)
[facility] ? ? ?Patient name: Troy [name] Admit date: 08/15/2021  ?DOB: 11-28-1960 LOS: 5  ?MRN: [id number] Consult date: 08/19/2021  ?Referring provider: Dr. Roderic Palau, [facility]: Pleural effusion  ? ? ?History:  ?61 yo male smoker presented to [facility] ER with weakness, fatigue, nausea, vomiting and diarrhea.  Found to be positive for COVID 19 infection.  Also found to have DKA and pneumonia.  Chest xray showed loculated left pleural effusion with left base consolidation/atelectasis.  IR consulted to assess for left thoracentesis but was unsuccessful.  PCCM consulted to assist with assessment of pleural effusion. ? ?Past medical history:  ?DM type 2, PAD s/p Lt AKA, Diastolic CHF, GERD, HTN, HLD, Anxiety, Depression, Fatty liver, Nephrolithiasis, PTSD, Insomnia, Neuropathy ? ?Significant events:  ?2/25 admit ?2/28 IR attempted Lt thoracentesis but unsuccessful ? ?Studies:  ?PFT 06/07/16 >> FEV1 2.52 (63%), FEV1% 84, TLC 5.26 (71%), DLCO 73% ?A1AT 06/07/16 >> 154, MS ?Echo 02/12/21 >> EF 70 to 75%, mod LVH, grade 1 DD, mild LA dilation ?CT angio chest 08/18/21 >> stable ventricular aneurysm, multiloculated moderate Lt pleural effusion with LLL ASD, changes of cirrhosis ? ?Micro:  ?COVID 2/23 >> Positive ?Blood 2/25 >> Staph capitis (likely contaminate) ?Blood 2/28 >>  ? Lines:  ? ?  ?Antibiotics:  ?Remdesivir 2/25 off ?Paxlovid 2/26 >> ?Vancomycin 2/28 >>  ?Rocephin 2/28 ?Zithromax 2/28 on ?Unasyn 3/01 >>  ? Consults:  ? ?  ? ?Interim history:  ?He has cough.  Feels congested, but having trouble bringing up sputum.  Has soreness in chest from coughing.  Not needing supplemental oxygen.  Feels bloated.  Passing flatus, but no recent bowel movements. ? ?General: Somewhat confused male who appears older than his stated age ?HEENT: MM pink/moist continuous cough is noted ?Neuro: What days difficult with orientation and mentation ?CV: Heart sounds are regular ?PULM:  Decreased breath sounds in the bases.  Currently on room air was adequate saturations ? ? ?GI: soft, bsx4 active  ?GU: Voids ?Extremities: Left AKA is noted without obvious ulceration on the stump ?Skin: no rashes or lesions ? ?Assessment/plan:  ? ?Loculated Lt pleural effusion with pneumonia and COVID 19 positive. ?Presumed to be bacterial in nature. ?Interventional radiology attempted thoracentesis at [facility] without success ?Questionable placement of pigtail catheter either by pulmonary critical care at the bedside or interventional radiology questionable by CT-guided. ?He may need fibrinolytic therapy ?Continue antimicrobial therapy ?He may need surgical intervention in the future ? ? ?COPD with MS alpha 1 genotype.  Dr. 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 ?Per primary team ? ? ?Resolved hospital problems:  ?DKA ? ?Goals of care/Family discussions:  ?Code status: full  ? ?Labs:  ? ?CMP Latest Ref Rng & Units 08/20/2021 08/19/2021 08/18/2021  ?Glucose 70 - 99 mg/dL 171(H) 144(H) 217(H)  ?BUN 8 - 23 mg/dL <5(L) <5(L) 8  ?Creatinine 0.61 - 1.24 mg/dL 0.53(L) 0.52(L) 0.57(L)  ?Sodium 135 - 145 mmol/L 131(L) 129(L) 129(L)  ?Potassium 3.5 - 5.1 mmol/L 3.7 3.4(L) 3.4(L)  ?Chloride 98 - 111 mmol/L 99 100 101  ?CO2 22 - 32 mmol/L 20(L) 21(L) 17(L)  ?Calcium 8.9 - 10.3 mg/dL 7.3(L) 7.4(L) 7.8(L)  ?Total Protein 6.5 - 8.1 g/dL 4.9(L) 5.3(L) -  ?Total Bilirubin 0.3 - 1.2 mg/dL 0.9 0.6 -  ?Alkaline Phos 38 - 126 U/L 83 74 -  ?AST 15 - 41 U/L  18 20 -  ?ALT 0 - 44 U/L 13 14 -  ? ? ?CBC Latest Ref Rng & Units 08/20/2021 08/19/2021 08/18/2021  ?WBC 4.0 - 10.5 K/uL 19.1(H) 19.2(H) 22.6(H)  ?Hemoglobin 13.0 - 17.0 g/dL 12.7(L) 12.7(L) 14.0  ?Hematocrit 39.0 - 52.0 % 36.3(L) 36.2(L) 40.9  ?Platelets 150 - 400 K/uL 225 222 252  ? ? ?ABG ?   ?Component Value Date/Time  ? TCO2 22 02/22/2009 0224  ? ? ?CBG (last  3)  ?Recent Labs  ?  08/19/21 ?1823 08/19/21 ?2112 08/20/21 ?0834  ?GLUCAP 111* 112* 199*  ? ? ? ?Signature:  ?Gaylyn Lambert ACNP ?Acute Care Nurse Practitioner ?[place] ?Please consult Amion ?08/20/2021, 10:23 AM ? ? ? ? ? ? ? ?

## 2021-08-20 NOTE — Progress Notes (Signed)
PROGRESS NOTE    Troy [name]  FIE:[id number] DOB: 02-05-1961 DOA: 08/15/2021 PCP: Pcp, No    No chief complaint on file.   Brief Narrative:  61 y.o. male with medical history significant for COPD, DM, HTN, diastolic CHF history of noncompliance and prior amputations and ongoing tobacco abuse admitted on 08/16/2023 with hypokalemia in the setting of acute COVID-19 infection and DKA.  He was found to have loculated left-sided pleural effusion which was felt to be superimposed bacterial infection on underlying COVID.  He has been receiving intravenous antibiotics.  Seen by pulmonology who felt that he would benefit from pigtail catheter with pleural lytics.  He has been transferred to [facility] for further management.   No problems updated.   Assessment & Plan:   Principal Problem:   Parapneumonic effusion Active Problems:   S/P AKA (above knee amputation), left (HCC)   Bowel Ileus ([place])   ?? NASH Liver Cirrhosis ([place])   HTN (hypertension)   Chest pain   Ketoacidosis due to type 2 diabetes mellitus ([place])   COVID-19 virus infection   Diabetic acidosis without coma (HCC)   Hypokalemia   Hypomagnesemia   Hyponatremia   Pneumonia due to infectious organism  #1 DKA, POA -Likely secondary to noncompliance (per wife patient noncompliant with medications PTA )in the setting of left-sided multifocal pneumonia with effusion.. -Hemoglobin A1c 10.8 (08/18/2021) -Anion gap on admission noted at 20>>>> 2 6, serum glucose of 299. -Beta hydroxybutyrate 6.68>>> 0.72. -Acidosis improved and resolved.  Anion gap closed. -DKA pathophysiology resolved. -CBG 199 this morning. -Patient has been transition from insulin drip to subcutaneous insulin. -Continue Semglee 8 units twice daily, meal coverage NovoLog 3 units 3 times daily with meals, SSI. -Diabetes coordinator following.  2.  Loculated left pleural effusion/left-sided multifocal pneumonia -07/2821-unsuccessful ultrasound-guided  left thoracentesis -08/18/2021-patient empirically started on IV Vanco, Rocephin, azithromycin started in the setting of postviral pneumonia with positive MRSA screen. -Antibiotics changed to Vanco and Unasyn on 08/19/2021. -Placed on Mucinex twice daily. -PCCM consulted recommended pigtail catheter placement with pleural lytics and subsequently transferred to [facility]. -Patient seen at [facility] and reassessed by [facility] who are recommending IR evaluation for CT-guided chest tube placement and likely need for intrapleural lytic therapy due to appearance of effusion on imaging. -IR consulted and patient for chest tube placement under CT guidance today 08/20/2021. -PCCM following and appreciate input and recommendations.  3.  COVID-19 infection -Tested positive on 08/13/2021. -Status post 2 doses of IV remdesivir. -Continue Paxlovid.  4.  Episodes of atypical chest pain -EKG reviewed with no acute ischemic changes noted. -Troponin from 08/16/2021, 08/17/2021 flat reassuring. -D-dimer noted to be elevated in the setting of COVID-19 infection. -CT angiogram chest done negative for PE. -Atypical change likely pleuritic in nature secondary to loculated effusion.  5.  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 -Continue aspirin.  6.  COPD/ongoing tobacco abuse -Stable. -Tobacco cessation stressed to patient. -Continue current bronchodilators. -PCCM following.  7.  Chronic hyponatremia -Felt likely secondary to hypovolemic hyponatremia secondary to GI losses and??  SIADH secondary to pneumonia. -Patient noted to be dehydrated in setting of COVID-19 infection with nausea vomiting and diarrhea -Hyponatremia improving with hydration. -Saline lock IV fluids -Follow.  8.  Possible NASH/cirrhosis -Abdominal ultrasound from 03/2017 showed fatty  liver, CT abdomen and pelvis 08/13/2021,  08/15/2021 with concerns for liver cirrhosis. -Outpatient follow-up with GI and repeat FLP as outpatient advised to patient and family.  9.  Bowel ileus -Improved. -Patient noted to have had bowel movement 08/19/2021. -Placed on MiraLAX daily.  10.  Hypomagnesemia/hypokalemia/hypophosphatemia -Patient oral potassium supplementation potassium at 3.7 today. -Magnesium at 1.2. -Phosphorus at 2.4. -K-Phos 30 mmol IV x1. -Magnesium sulfate 4 g IV x1. -Repeat labs in the AM.   DVT prophylaxis: Lovenox Code Status: Full Family Communication: Updated patient.  No family at bedside. Disposition:   Status is: Inpatient Remains inpatient appropriate because: Severity of illness           Consultants:  PCCM: Dr. Halford Chessman 08/19/2021 IR: Dr.Mir 08/20/2021  Procedures:  CT angiogram chest 08/18/2021 CT abdomen and pelvis 08/18/2021 Acute abdominal series 08/18/2021, 08/19/2021 Chest x-ray 08/15/2021, 08/18/2021  Antimicrobials:  IV Unasyn 08/19/2021>>> IV azithromycin 08/18/2021>>>> 08/19/2021 IV Rocephin 08/18/2021>>>> 08/19/2021 IV remdesivir 08/15/2021>>>> 08/16/2021 IV vancomycin 08/18/2021>>>> Paxlovid 08/16/2021>>>> 08/21/2021    Subjective: Laying in bed.  States overall feeling a little bit better.  Denies any significant shortness of breath.  No significant chest pain.  Coughing.  Asking when he will be able to go home after pigtail catheter has been placed.  Objective: Vitals:   08/20/21 1510 08/20/21 1515 08/20/21 1520 08/20/21 1525  BP: (!) 141/84 (!) 152/89 (!) 142/88   Pulse: (!) 106 (!) 106 (!) 106 (!) 112  Resp: (!) 21 (!) 21 20 (!) 24  Temp:      TempSrc:      SpO2: 100% 99% 100% 94%  Weight:      Height:        Intake/Output Summary (Last 24 hours) at 08/20/2021 1605 Last data filed at 08/20/2021 1500 Gross per 24 hour  Intake 3760.77 ml  Output --  Net 3760.77 ml   Filed Weights   08/17/21 0500 08/18/21 0545 08/19/21 0516  Weight:  81.1 kg 83.2 kg 87.2 kg    Examination:  General exam: Appears calm and comfortable  Respiratory system: Decreased breath sounds in the left base.  Some coarse breath sounds in the right base.  No wheezing.  Fair air movement.   Cardiovascular system: S1 & S2 heard, RRR. No JVD, murmurs, rubs, gallops or clicks. No pedal edema. Gastrointestinal system: Abdomen is nondistended, soft and nontender. No organomegaly or masses felt. Normal bowel sounds heard. Central nervous system: Alert and oriented. No focal neurological deficits. Extremities: Status post left AKA.  Status post amputation of right great and second toe. Skin: No rashes, lesions or ulcers Psychiatry: Judgement and insight appear fair. Mood & affect appropriate.     Data Reviewed: I have personally reviewed following labs and imaging studies  CBC: Recent Labs  Lab 08/16/21 0405 08/17/21 0457 08/18/21 0434 08/19/21 0404 08/20/21 0319  WBC 17.7* 16.4* 22.6* 19.2* 19.1*  NEUTROABS 14.3* 13.4* 18.2* 15.2* 15.6*  HGB 13.7 13.4 14.0 12.7* 12.7*  HCT 39.6 37.8* 40.9 36.2* 36.3*  MCV 82.2 83.3 84.5 83.2 82.5  PLT 189 164 252 222 027    Basic Metabolic Panel: Recent Labs  Lab 08/15/21 2014 08/15/21 2229 08/16/21 1458 08/17/21 0457 08/18/21 0842 08/19/21 0404 08/20/21 0805  NA 126*   < > 124* 125* 129* 129* 131*  K 2.8*   < > 3.4* 3.9 3.4* 3.4* 3.7  CL 89*   < > 97* 100 101 100 99  CO2 17*   < > 20* 19* 17* 21* 20*  GLUCOSE 299*   < >  157* 257* 217* 144* 171*  BUN 11   < > 9 9 8  <5* <5*  CREATININE 0.64   < > 0.49* 0.56* 0.57* 0.52* 0.53*  CALCIUM 8.1*   < > 7.7* 7.5* 7.8* 7.4* 7.3*  MG 1.4*  --   --   --   --   --  1.2*  PHOS  --   --   --   --  1.5*  --  2.4*   < > = values in this interval not displayed.    GFR: Estimated Creatinine Clearance: 100.1 mL/min (A) (by C-G formula based on SCr of 0.53 mg/dL (L)).  Liver Function Tests: Recent Labs  Lab 08/15/21 2014 08/18/21 0842 08/19/21 0404  08/20/21 0805  AST 15  --  20 18  ALT 14  --  14 13  ALKPHOS 78  --  74 83  BILITOT 1.6*  --  0.6 0.9  PROT 7.0  --  5.3* 4.9*  ALBUMIN 3.1* 2.4* 2.0* 1.7*    CBG: Recent Labs  Lab 08/19/21 1621 08/19/21 1823 08/19/21 2112 08/20/21 0834 08/20/21 1241  GLUCAP 116* 111* 112* 199* 156*     Recent Results (from the past 240 hour(s))  Resp Panel by RT-PCR (Flu A&B, Covid) Nasopharyngeal Swab     Status: Abnormal   Collection Time: 08/13/21  6:28 AM   Specimen: Nasopharyngeal Swab; Nasopharyngeal(NP) swabs in vial transport medium  Result Value Ref Range Status   SARS Coronavirus 2 by RT PCR POSITIVE (A) NEGATIVE Final    Comment: (NOTE) SARS-CoV-2 target nucleic acids are DETECTED.  The SARS-CoV-2 RNA is generally detectable in upper respiratory specimens during the acute phase of infection. Positive results are indicative of the presence of the identified virus, but do not rule out bacterial infection or co-infection with other pathogens not detected by the test. Clinical correlation with patient history and other diagnostic information is necessary to determine patient infection status. The expected result is Negative.  Fact Sheet for Patients: EntrepreneurPulse.com.au  Fact Sheet for Healthcare Providers: IncredibleEmployment.be  This test is not yet approved or cleared by the [place] FDA and  has been authorized for detection and/or diagnosis of SARS-CoV-2 by FDA under an Emergency Use Authorization (EUA).  This EUA will remain in effect (meaning this test can be used) for the duration of  the COVID-19 declaration under Section 564(b)(1) of the A ct, 21 U.S.C. section 360bbb-3(b)(1), unless the authorization is terminated or revoked sooner.     Influenza A by PCR NEGATIVE NEGATIVE Final   Influenza B by PCR NEGATIVE NEGATIVE Final    Comment: (NOTE) The Xpert Xpress SARS-CoV-2/FLU/RSV plus assay is intended as an aid in  the diagnosis of influenza from Nasopharyngeal swab specimens and should not be used as a sole basis for treatment. Nasal washings and aspirates are unacceptable for Xpert Xpress SARS-CoV-2/FLU/RSV testing.  Fact Sheet for Patients: EntrepreneurPulse.com.au  Fact Sheet for Healthcare Providers: IncredibleEmployment.be  This test is not yet approved or cleared by the [place] FDA and has been authorized for detection and/or diagnosis of SARS-CoV-2 by FDA under an Emergency Use Authorization (EUA). This EUA will remain in effect (meaning this test can be used) for the duration of the COVID-19 declaration under Section 564(b)(1) of the Act, 21 U.S.C. section 360bbb-3(b)(1), unless the authorization is terminated or revoked.  Performed at [facility], [place] [address], [place], [place] [address]   Culture, blood (routine x 2)     Status:  Abnormal   Collection Time: 08/15/21  8:14 PM   Specimen: BLOOD LEFT FOREARM  Result Value Ref Range Status   Specimen Description BLOOD LEFT FOREARM  Final   Special Requests   Final    BOTTLES DRAWN AEROBIC ONLY Blood Culture adequate volume   Culture  Setup Time   Final    GRAM POSITIVE COCCI AEROBIC BOTTLE ONLY Gram Stain Report Called to,Read Back By and Verified With: B. FOLEY @ [phone] 08/17/21 BY STEPHTR CRITICAL RESULT CALLED TO, READ BACK BY AND VERIFIED WITH: PHARM D Rumson ON [id number] AT 58 BY E.PARRISH    Culture (A)  Final    STAPHYLOCOCCUS CAPITIS THE SIGNIFICANCE OF ISOLATING THIS ORGANISM FROM A SINGLE SET OF BLOOD CULTURES WHEN MULTIPLE SETS ARE DRAWN IS UNCERTAIN. PLEASE NOTIFY THE MICROBIOLOGY DEPARTMENT WITHIN ONE WEEK IF SPECIATION AND SENSITIVITIES ARE REQUIRED. Performed at [facility], [place] [address] [place], [place] [address]    Report Status 08/19/2021 FINAL  Final  Blood Culture ID Panel (Reflexed)     Status: Abnormal   Collection Time: 08/15/21  8:14 PM  Result  Value Ref Range Status   Enterococcus faecalis NOT DETECTED NOT DETECTED Final   Enterococcus Faecium NOT DETECTED NOT DETECTED Final   Listeria monocytogenes NOT DETECTED NOT DETECTED Final   Staphylococcus species DETECTED (A) NOT DETECTED Final    Comment: CRITICAL RESULT CALLED TO, READ BACK BY AND VERIFIED WITH: PHARM D S.HURTH ON [id number] AT 1436 BY E.PARRISH    Staphylococcus aureus (BCID) NOT DETECTED NOT DETECTED Final   Staphylococcus epidermidis NOT DETECTED NOT DETECTED Final   Staphylococcus lugdunensis NOT DETECTED NOT DETECTED Final   Streptococcus species NOT DETECTED NOT DETECTED Final   Streptococcus agalactiae NOT DETECTED NOT DETECTED Final   Streptococcus pneumoniae NOT DETECTED NOT DETECTED Final   Streptococcus pyogenes NOT DETECTED NOT DETECTED Final   A.calcoaceticus-baumannii NOT DETECTED NOT DETECTED Final   Bacteroides fragilis NOT DETECTED NOT DETECTED Final   Enterobacterales NOT DETECTED NOT DETECTED Final   Enterobacter cloacae complex NOT DETECTED NOT DETECTED Final   Escherichia coli NOT DETECTED NOT DETECTED Final   Klebsiella aerogenes NOT DETECTED NOT DETECTED Final   Klebsiella oxytoca NOT DETECTED NOT DETECTED Final   Klebsiella pneumoniae NOT DETECTED NOT DETECTED Final   Proteus species NOT DETECTED NOT DETECTED Final   Salmonella species NOT DETECTED NOT DETECTED Final   Serratia marcescens NOT DETECTED NOT DETECTED Final   Haemophilus influenzae NOT DETECTED NOT DETECTED Final   Neisseria meningitidis NOT DETECTED NOT DETECTED Final   Pseudomonas aeruginosa NOT DETECTED NOT DETECTED Final   Stenotrophomonas maltophilia NOT DETECTED NOT DETECTED Final   Candida albicans NOT DETECTED NOT DETECTED Final   Candida auris NOT DETECTED NOT DETECTED Final   Candida glabrata NOT DETECTED NOT DETECTED Final   Candida krusei NOT DETECTED NOT DETECTED Final   Candida parapsilosis NOT DETECTED NOT DETECTED Final   Candida tropicalis NOT DETECTED NOT  DETECTED Final   Cryptococcus neoformans/gattii NOT DETECTED NOT DETECTED Final    Comment: Performed at [facility], [address], [place], [place] [address]  Culture, blood (routine x 2)     Status: None   Collection Time: 08/15/21  8:39 PM   Specimen: BLOOD RIGHT HAND  Result Value Ref Range Status   Specimen Description BLOOD RIGHT HAND  Final   Special Requests   Final    BOTTLES DRAWN AEROBIC ONLY Blood Culture adequate volume   Culture  Final    NO GROWTH 5 DAYS Performed at [facility], [address] [place], [place] [address]    Report Status 08/20/2021 FINAL  Final  MRSA Next Gen by PCR, Nasal     Status: Abnormal   Collection Time: 08/16/21  2:15 AM   Specimen: Nasal Mucosa; Nasal Swab  Result Value Ref Range Status   MRSA by PCR Next Gen DETECTED (A) NOT DETECTED Final    Comment: RESULT CALLED TO, READ BACK BY AND VERIFIED WITH: KINDLEY,C @ [phone] ON 08/16/21 BY JUW (NOTE) The GeneXpert MRSA Assay (FDA approved for NASAL specimens only), is one component of a comprehensive MRSA colonization surveillance program. It is not intended to diagnose MRSA infection nor to guide or monitor treatment for MRSA infections. Test performance is not FDA approved in patients less than 72 years old. Performed at [facility], [address], [place], [place] [address]   Culture, blood (Routine X 2) w Reflex to ID Panel     Status: None (Preliminary result)   Collection Time: 08/18/21  8:41 AM   Specimen: BLOOD RIGHT HAND  Result Value Ref Range Status   Specimen Description BLOOD RIGHT HAND  Final   Special Requests   Final    BOTTLES DRAWN AEROBIC AND ANAEROBIC Blood Culture adequate volume   Culture   Final    NO GROWTH 2 DAYS Performed at [facility], [address], [place], [place] [address]    Report Status PENDING  Incomplete  Culture, blood (Routine X 2) w Reflex to ID Panel     Status: None (Preliminary result)   Collection Time: 08/18/21  8:42 AM   Specimen:  Left Antecubital; Blood  Result Value Ref Range Status   Specimen Description LEFT ANTECUBITAL  Final   Special Requests   Final    BOTTLES DRAWN AEROBIC AND ANAEROBIC Blood Culture adequate volume   Culture   Final    NO GROWTH 2 DAYS Performed at [facility], [address] [place], [place] [address]    Report Status PENDING  Incomplete         Radiology Studies: DG ABD ACUTE 2+V W 1V CHEST  Result Date: 08/19/2021 CLINICAL DATA:  Pneumonia and CHF, COVID positive EXAM: DG ABDOMEN ACUTE WITH 1 VIEW CHEST COMPARISON:  08/18/2021 FINDINGS: Similar left effusion extending laterally and dense left lower lobe collapse/consolidation. No free air. Diffuse similar gaseous distension of the small and large bowel, little interval change. Ileus is favored. Degenerative changes noted of the spine. Aortoiliac atherosclerosis noted. IMPRESSION: Similar diffuse gaseous distention of the small and large bowel compatible with ileus. Persistent left effusion and left lower lobe dense collapse/consolidation. Aortic Atherosclerosis (ICD10-I70.0). Electronically Signed   By: Jerilynn Mages.  Shick M.D.   On: 08/19/2021 08:11        Scheduled Meds:  amitriptyline  50 mg Oral QHS   aspirin EC  81 mg Oral Q breakfast   Chlorhexidine Gluconate Cloth  6 each Topical Daily   Chlorhexidine Gluconate Cloth  6 each Topical Q0600   enoxaparin (LOVENOX) injection  40 mg Subcutaneous Q24H   gabapentin  300 mg Oral QID   guaiFENesin  1,200 mg Oral BID   insulin aspart  0-20 Units Subcutaneous TID WC   insulin aspart  0-5 Units Subcutaneous QHS   insulin aspart  3 Units Subcutaneous TID WC   insulin glargine-yfgn  8 Units Subcutaneous BID   Ipratropium-Albuterol  1 puff Inhalation QID   lidocaine  living well with diabetes book   Does not apply Once   mometasone-formoterol  2 puff Inhalation BID   mupirocin ointment  1 application Nasal BID   mupirocin ointment  1 application Nasal BID   nirmatrelvir/ritonavir EUA   3 tablet Oral BID   pantoprazole  40 mg Oral Daily   polyethylene glycol  17 g Oral Daily   potassium chloride  40 mEq Oral BID   Continuous Infusions:  sodium chloride 100 mL/hr at 08/20/21 5093   sodium chloride Stopped (08/20/21 0309)   ampicillin-sulbactam (UNASYN) IV 3 g (08/20/21 1557)   magnesium sulfate bolus IVPB     potassium PHOSPHATE IVPB (in mmol)     vancomycin 1,250 mg (08/20/21 0309)     LOS: 5 days    Time spent: 40 minutes    Irine Seal, MD [facility]   To contact the attending provider between 7A-7P or the covering provider during after hours 7P-7A, please log into the web site [email or web address] and access using universal [place] password for that web site. If you do not have the password, please call the hospital operator.  08/20/2021, 4:05 PM

## 2021-08-20 NOTE — Care Management Important Message (Signed)
Important Message ? ?Patient Details  ?Name: Troy [name] ?MRN: [id number] ?Date of Birth: [birth year] ? ? ?Medicare Important Message Given:  Yes ? ? ? ? ?Shelda Altes ?08/20/2021, 9:59 AM ?

## 2021-08-20 NOTE — Progress Notes (Signed)
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 144* 171*  BUN 9 8 <5* <5*  CALCIUM 7.5* 7.8* 7.4* 7.3*  CREATININE 0.56* 0.57* 0.52* 0.53*  GFRNONAA >60 >60 >60 >60    LIVER FUNCTION TESTS: Recent Labs    08/13/21 0158 08/15/21 2014 08/18/21 0842 08/19/21 0404 08/20/21 0805  BILITOT 0.9 1.6*  --  0.6 0.9  AST 24 15  --  20 18  ALT 25 14  --  14 13  ALKPHOS 88 78  --  74 83  PROT 6.9 7.0  --  5.3* 4.9*  ALBUMIN 3.6 3.1* 2.4* 2.0* 1.7*    TUMOR MARKERS: No results for input(s): AFPTM, CEA, CA199, CHROMGRNA in the last 8760 hours.  Assessment and Plan: Empyema Patient with history of COVID, now with presumed bacterial pneumonia an loculated left pleural effusion.  Thoracentesis attempted 08/18/21 however unsuccessful likely due to loculated fluid.  IR now consulted for left chest tube.  Tmax 99.6 WBC 19.1 Patient received prophylactic lovenox at 10am, reviewed with MD.   Risks and benefits of chest tube placement were discussed with the patient including bleeding, infection, damage to adjacent structures, malfunction  of the tube requiring additional procedures and sepsis.  All of the patient's questions were answered, patient is agreeable to proceed. Consent signed and in chart.     Thank you for this interesting consult.  I greatly enjoyed meeting Troy [name] and look forward to participating in their care.  A copy of this report was sent to the requesting provider on this date.  Electronically Signed: Docia Barrier, PA 08/20/2021, 2:18 PM   I spent a total of 40 Minutes    in face to face in clinical consultation, greater than 50% of which was counseling/coordinating care for empyema.

## 2021-08-21 ENCOUNTER — Inpatient Hospital Stay (HOSPITAL_COMMUNITY): Payer: Medicare HMO

## 2021-08-21 DIAGNOSIS — U071 COVID-19: Secondary | ICD-10-CM | POA: Diagnosis not present

## 2021-08-21 DIAGNOSIS — I10 Essential (primary) hypertension: Secondary | ICD-10-CM | POA: Diagnosis not present

## 2021-08-21 DIAGNOSIS — Z4682 Encounter for fitting and adjustment of non-vascular catheter: Secondary | ICD-10-CM

## 2021-08-21 DIAGNOSIS — J189 Pneumonia, unspecified organism: Secondary | ICD-10-CM | POA: Diagnosis not present

## 2021-08-21 DIAGNOSIS — J918 Pleural effusion in other conditions classified elsewhere: Secondary | ICD-10-CM | POA: Diagnosis not present

## 2021-08-21 DIAGNOSIS — E1310 Other specified diabetes mellitus with ketoacidosis without coma: Secondary | ICD-10-CM | POA: Diagnosis not present

## 2021-08-21 LAB — CBC WITH DIFFERENTIAL/PLATELET
Abs Immature Granulocytes: 0.38 10*3/uL — ABNORMAL HIGH (ref 0.00–0.07)
Basophils Absolute: 0.1 10*3/uL (ref 0.0–0.1)
Basophils Relative: 0 %
Eosinophils Absolute: 0.1 10*3/uL (ref 0.0–0.5)
Eosinophils Relative: 0 %
HCT: 33.9 % — ABNORMAL LOW (ref 39.0–52.0)
Hemoglobin: 12.2 g/dL — ABNORMAL LOW (ref 13.0–17.0)
Immature Granulocytes: 2 %
Lymphocytes Relative: 7 %
Lymphs Abs: 1.3 10*3/uL (ref 0.7–4.0)
MCH: 31.3 pg (ref 26.0–34.0)
MCHC: 36 g/dL (ref 30.0–36.0)
MCV: 86.9 fL (ref 80.0–100.0)
Monocytes Absolute: 1.5 10*3/uL — ABNORMAL HIGH (ref 0.1–1.0)
Monocytes Relative: 8 %
Neutro Abs: 16 10*3/uL — ABNORMAL HIGH (ref 1.7–7.7)
Neutrophils Relative %: 83 %
Platelets: 261 10*3/uL (ref 150–400)
RBC: 3.9 MIL/uL — ABNORMAL LOW (ref 4.22–5.81)
RDW: 13.8 % (ref 11.5–15.5)
WBC: 19.4 10*3/uL — ABNORMAL HIGH (ref 4.0–10.5)
nRBC: 0 % (ref 0.0–0.2)

## 2021-08-21 LAB — RENAL FUNCTION PANEL
Albumin: 1.6 g/dL — ABNORMAL LOW (ref 3.5–5.0)
Anion gap: 11 (ref 5–15)
BUN: <5 mg/dL — ABNORMAL LOW (ref 8–23)
CO2: 22 mmol/L (ref 22–32)
Calcium: 7.3 mg/dL — ABNORMAL LOW (ref 8.9–10.3)
Chloride: 98 mmol/L (ref 98–111)
Creatinine, Ser: 0.46 mg/dL — ABNORMAL LOW (ref 0.61–1.24)
GFR, Estimated: >60 mL/min (ref >60)
Glucose, Bld: 155 mg/dL — ABNORMAL HIGH (ref 70–99)
Phosphorus: 4 mg/dL (ref 2.5–4.6)
Potassium: 4.5 mmol/L (ref 3.5–5.1)
Sodium: 131 mmol/L — ABNORMAL LOW (ref 135–145)

## 2021-08-21 LAB — VANCOMYCIN, PEAK: Vancomycin Pk: 49 ug/mL — ABNORMAL HIGH (ref 30–40)

## 2021-08-21 LAB — BLOOD CULTURE ID PANEL (REFLEXED) - BCID2

## 2021-08-21 LAB — GLUCOSE, CAPILLARY
Glucose-Capillary: 196 mg/dL — ABNORMAL HIGH (ref 70–99)
Glucose-Capillary: 177 mg/dL — ABNORMAL HIGH (ref 70–99)
Glucose-Capillary: 207 mg/dL — ABNORMAL HIGH (ref 70–99)
Glucose-Capillary: 209 mg/dL — ABNORMAL HIGH (ref 70–99)

## 2021-08-21 LAB — MAGNESIUM: Magnesium: 1.8 mg/dL (ref 1.7–2.4)

## 2021-08-21 MED ORDER — ALTEPLASE 2 MG IJ SOLR
10.0000 mg | Freq: Once | INTRAMUSCULAR | Status: AC
  Administered 2021-08-21: 10 mg via INTRAPLEURAL
  Filled 2021-08-21 (×2): qty 10

## 2021-08-21 MED ORDER — ENSURE ENLIVE PO LIQD
237.0000 mL | Freq: Two times a day (BID) | ORAL | Status: DC
  Administered 2021-08-21 – 2021-08-25 (×7): 237 mL via ORAL

## 2021-08-21 MED ORDER — MAGNESIUM SULFATE 4 GM/100ML IV SOLN
4.0000 g | Freq: Once | INTRAVENOUS | Status: AC
  Administered 2021-08-21: 4 g via INTRAVENOUS
  Filled 2021-08-21: qty 100

## 2021-08-21 MED ORDER — POLYETHYLENE GLYCOL 3350 17 G PO PACK: 17.0000 g | PACK | Freq: Every day | ORAL | Status: DC | PRN

## 2021-08-21 MED ORDER — INSULIN GLARGINE-YFGN 100 UNIT/ML ~~LOC~~ SOLN
10.0000 [IU] | Freq: Two times a day (BID) | SUBCUTANEOUS | Status: DC
  Administered 2021-08-21: 10 [IU] via SUBCUTANEOUS
  Filled 2021-08-21 (×3): qty 0.1

## 2021-08-21 MED ORDER — SENNOSIDES-DOCUSATE SODIUM 8.6-50 MG PO TABS
1.0000 | ORAL_TABLET | Freq: Two times a day (BID) | ORAL | Status: DC
  Administered 2021-08-21 – 2021-08-27 (×10): 1 via ORAL
  Filled 2021-08-21 (×11): qty 1

## 2021-08-21 MED ORDER — STERILE WATER FOR INJECTION IJ SOLN
5.0000 mg | Freq: Once | RESPIRATORY_TRACT | Status: AC
  Administered 2021-08-21: 5 mg via INTRAPLEURAL
  Filled 2021-08-21 (×2): qty 5

## 2021-08-21 NOTE — Progress Notes (Signed)
[facility] ? ? ?Patient name: FRANCISZEK PLATTEN Admit date: 08/15/2021  ?DOB: 04-09-1961 LOS: 6  ?MRN: [id number] Consult date: 08/19/2021  ?Referring provider: Dr. Roderic Palau, [facility]: Pleural effusion  ? ? ?History:  ?61 yo male smoker presented to [facility] ER with weakness, fatigue, nausea, vomiting and diarrhea.  Found to be positive for COVID 19 infection.  Also found to have DKA and pneumonia.  Chest xray showed loculated left pleural effusion with left base consolidation/atelectasis.  IR consulted to assess for left thoracentesis but was unsuccessful.  PCCM consulted to assist with assessment of pleural effusion. ? ?Past medical history:  ?DM type 2, PAD s/p Lt AKA, Diastolic CHF, GERD, HTN, HLD, Anxiety, Depression, Fatty liver, Nephrolithiasis, PTSD, Insomnia, Neuropathy ? ?Significant events:  ?2/25 admit ?2/28 IR attempted Lt thoracentesis but unsuccessful ?3/2 chest tube placement ?3/3 first dose of intrapleural lytics; 360cc pleural fluid in atrium pre-lytics ? ?Studies:  ?PFT 06/07/16 >> FEV1 2.52 (63%), FEV1% 84, TLC 5.26 (71%), DLCO 73% ?A1AT 06/07/16 >> 154, MS ?Echo 02/12/21 >> EF 70 to 75%, mod LVH, grade 1 DD, mild LA dilation ?CT angio chest 08/18/21 >> stable ventricular aneurysm, multiloculated moderate Lt pleural effusion with LLL ASD, changes of cirrhosis ? ? ?Interim history:  ?Some pain from the chest tube site, but overall feeling well. ? ?General: Elderly man lying in bed no acute distress ?HEENT: [place]/AT, eyes anicteric ?Neuro: Awake, alert, moving all extremities.  Normal speech. ?CV: S1-S2, regular rate and rhythm ?PULM: CTAB anteriorly.  Purulent drainage from chest tube.  No airleak. 360cc in atrium pre-lytics ?GI: Soft, nontender ?Extremities: No clubbing or cyanosis ?Skin: Warm, dry, no rashes ? ?Note pleural fluid studies sent ?Pleural culture-pending ?WBC 19.4 ?H/H 12.2/33.9 ? ?Assessment/plan:  ?Acute respiratory failure with hypoxia,  on RA this morning ?Loculated Lt pleural effusion with pneumonia and COVID 19 positive. ?-Continue chest tube to suction. ?-First use of intrapleural lytics administered today.  Chest tube clamped for an hour, then placed back to suction. ?-Continue broad antibiotics, can tailor based on cultures.  If he is able to produce a sputum sample, collect this as well. ?- AM CXR ? ?COPD with MS alpha 1 genotype.  Dr. Agustina Caroli clinic patient ?-Continue bronchodilators ? ?Rest of treatment per primary team.  We will follow-up this weekend. ? ?Resolved hospital problems:  ?DKA ? ?Goals of care/Family discussions:  ?Code status: full  ? ?Labs:  ? ?CMP Latest Ref Rng & Units 08/20/2021 08/19/2021 08/18/2021  ?Glucose 70 - 99 mg/dL 171(H) 144(H) 217(H)  ?BUN 8 - 23 mg/dL <5(L) <5(L) 8  ?Creatinine 0.61 - 1.24 mg/dL 0.53(L) 0.52(L) 0.57(L)  ?Sodium 135 - 145 mmol/L 131(L) 129(L) 129(L)  ?Potassium 3.5 - 5.1 mmol/L 3.7 3.4(L) 3.4(L)  ?Chloride 98 - 111 mmol/L 99 100 101  ?CO2 22 - 32 mmol/L 20(L) 21(L) 17(L)  ?Calcium 8.9 - 10.3 mg/dL 7.3(L) 7.4(L) 7.8(L)  ?Total Protein 6.5 - 8.1 g/dL 4.9(L) 5.3(L) -  ?Total Bilirubin 0.3 - 1.2 mg/dL 0.9 0.6 -  ?Alkaline Phos 38 - 126 U/L 83 74 -  ?AST 15 - 41 U/L 18 20 -  ?ALT 0 - 44 U/L 13 14 -  ? ? ?CBC Latest Ref Rng & Units 08/21/2021 08/20/2021 08/19/2021  ?WBC 4.0 - 10.5 K/uL 19.4(H) 19.1(H) 19.2(H)  ?Hemoglobin 13.0 - 17.0 g/dL 12.2(L) 12.7(L) 12.7(L)  ?Hematocrit 39.0 - 52.0 % 33.9(L) 36.3(L) 36.2(L)  ?Platelets 150 - 400 K/uL 261 225 222  ? ?Mickel Baas  Naomie Dean, DO 08/21/21 10:17 AM ?[facility] ? ? ?

## 2021-08-21 NOTE — Procedures (Signed)
Pleural Fibrinolytic Administration Procedure Note ? ?BRIEN LOWE  ?[id number]  ?1960/11/26 ? ?Date:08/21/21  ?Time:10:13 AM  ? ?Provider Performing:Melody Cirrincione Naomie Dean  ? ?Procedure: Pleural Fibrinolysis Initial day (78938) ? ?Indication(s) ?Fibrinolysis of complicated pleural effusion ? ?Consent ?Risks of the procedure as well as the alternatives and risks of each were explained to the patient and/or caregiver.  Consent for the procedure was obtained. ? ? ?Anesthesia ?None ? ? ?Time Out ?Verified patient identification, verified procedure, site/side was marked, verified correct patient position, special equipment/implants available, medications/allergies/relevant history reviewed, required imaging and test results available. ? ? ?Sterile Technique ?Hand hygiene, gloves ? ? ?Procedure Description ?Existing pleural catheter was cleaned and accessed in sterile manner.  10mg  of tPA in 30cc of saline and 5mg  of dornase in 30cc of sterile water were injected into pleural space using existing pleural catheter.  Catheter will be clamped for 1 hour and then placed back to suction. ? ? ?Complications/Tolerance ?None; patient tolerated the procedure well. ? ? ?EBL ?None ? ? ?Specimen(s) ?None ? ?Julian Hy, DO 08/21/21 10:13 AM ?[facility] ? ?

## 2021-08-21 NOTE — Progress Notes (Signed)
PROGRESS NOTE    Troy [name]  CHE:[id number] DOB: 07/01/1960 DOA: 08/15/2021 PCP: Pcp, No    No chief complaint on file.   Brief Narrative:  61 y.o. male with medical history significant for COPD, DM, HTN, diastolic CHF history of noncompliance and prior amputations and ongoing tobacco abuse admitted on 08/16/2023 with hypokalemia in the setting of acute COVID-19 infection and DKA.  He was found to have loculated left-sided pleural effusion which was felt to be superimposed bacterial infection on underlying COVID.  He has been receiving intravenous antibiotics.  Seen by pulmonology who felt that he would benefit from pigtail catheter with pleural lytics.  He has been transferred to [facility] for further management.   No problems updated.   Assessment & Plan:   Principal Problem:   Parapneumonic effusion Active Problems:   S/P AKA (above knee amputation), left (HCC)   Bowel Ileus ([place])   ?? NASH Liver Cirrhosis ([place])   HTN (hypertension)   Chest pain   Ketoacidosis due to type 2 diabetes mellitus ([place])   COVID-19 virus infection   Diabetic acidosis without coma (HCC)   Hypokalemia   Hypomagnesemia   Hyponatremia   Pneumonia due to infectious organism  #1 DKA, POA -Likely secondary to noncompliance (per wife patient noncompliant with medications PTA )in the setting of left-sided multifocal pneumonia with effusion.. -Hemoglobin A1c 10.8 (08/18/2021) -Anion gap on admission noted at 20>>>> 2 6, serum glucose of 299. -Beta hydroxybutyrate 6.68>>> 0.72. -Acidosis improved and resolved.  Anion gap closed. -DKA pathophysiology resolved. -CBG 196 this morning. -Patient has been transition from insulin drip to subcutaneous insulin. -Increase  Semglee 10 units twice daily, continue meal coverage NovoLog 3 units 3 times daily with meals, SSI. -Diabetes coordinator following.  2.  Loculated left pleural effusion/left-sided multifocal pneumonia -07/2821-unsuccessful  ultrasound-guided left thoracentesis -08/18/2021-patient empirically started on IV Vanco, Rocephin, azithromycin started in the setting of postviral pneumonia with positive MRSA screen. -Antibiotics changed to Vanco and Unasyn on 08/19/2021. -Continue Mucinex twice daily. -PCCM consulted recommended pigtail catheter placement with pleural lytics and subsequently transferred to [facility]. -Patient seen at [facility] and reassessed by [facility] who are recommending IR evaluation for CT-guided chest tube placement and likely need for intrapleural lytic therapy due to appearance of effusion on imaging. -IR consulted and patient s/p chest tube placement under CT guidance 08/20/2021. -PCCM following and patient underwent pleural fibrinolysis today 08/21/2021.  Appreciate PCCM input and recommendations.  3.  COVID-19 infection -Tested positive on 08/13/2021. -Status post 2 doses of IV remdesivir. -Continue Paxlovid.  4.  Episodes of atypical chest pain -EKG reviewed with no acute ischemic changes noted. -Troponin from 08/16/2021, 08/17/2021 flat reassuring. -D-dimer noted to be elevated in the setting of COVID-19 infection. -CT angiogram chest done negative for PE. -Atypical change likely pleuritic in nature secondary to loculated effusion.  5.  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 -Continue aspirin.  6.  COPD/ongoing tobacco abuse -Stable. -Tobacco cessation stressed to patient. -Continue current bronchodilators. -PCCM following.  7.  Chronic hyponatremia -Felt likely secondary to hypovolemic hyponatremia secondary to GI losses and??  SIADH secondary to pneumonia. -Patient noted to be dehydrated in setting of COVID-19 infection with nausea vomiting and diarrhea -Hyponatremia improved with hydration.   -Sodium level at 131.   -IV fluids have  been saline locked.  -  Follow.  8.  Possible NASH/cirrhosis -Abdominal ultrasound from 03/2017 showed fatty liver, CT abdomen and pelvis 08/13/2021, 08/15/2021 with concerns for liver cirrhosis. -Outpatient follow-up with GI and repeat FLP as outpatient advised to patient and family.  9.  Bowel ileus -Improved. -Patient noted to have had bowel movement 08/19/2021. -Patient noted to have some loose stools and as such we will discontinue MiraLAX.    10.  Hypomagnesemia/hypokalemia/hypophosphatemia -Patient oral potassium supplementation potassium at 4.5 -Magnesium currently at 1.8 from 1.2. -Phosphorus at 4. -Magnesium sulfate 4 g IV x1. -Discontinue potassium supplementation. -Repeat labs in the a.m.   DVT prophylaxis: Lovenox Code Status: Full Family Communication: Updated patient.  No family at bedside. Disposition:   Status is: Inpatient Remains inpatient appropriate because: Severity of illness           Consultants:  PCCM: Dr. Halford Chessman 08/19/2021 IR: Dr.Mir 08/20/2021  Procedures:  CT angiogram chest 08/18/2021 CT abdomen and pelvis 08/18/2021 Acute abdominal series 08/18/2021, 08/19/2021 Chest x-ray 08/15/2021, 08/18/2021  Antimicrobials:  IV Unasyn 08/19/2021>>> IV azithromycin 08/18/2021>>>> 08/19/2021 IV Rocephin 08/18/2021>>>> 08/19/2021 IV remdesivir 08/15/2021>>>> 08/16/2021 IV vancomycin 08/18/2021>>>> Paxlovid 08/16/2021>>>> 08/21/2021    Subjective: Laying in bed.  States cough is improved.  Overall feeling better.  Feels shortness of breath is improving.  Still complains of discomfort around chest tube site.   Objective: Vitals:   08/20/21 2020 08/21/21 0050 08/21/21 0453 08/21/21 0911  BP: 139/87 (!) 148/92 139/89 (!) 147/86  Pulse: (!) 117 (!) 118 (!) 104 (!) 102  Resp: 18 18 19    Temp: 98.6 F (37 C) 98.9 F (37.2 C) 98.3 F (36.8 C)   TempSrc: Oral Oral Oral   SpO2:   96% 93%  Weight:      Height:        Intake/Output Summary (Last 24 hours) at 08/21/2021  1253 Last data filed at 08/21/2021 0924 Gross per 24 hour  Intake 4234.03 ml  Output 2511 ml  Net 1723.03 ml    Filed Weights   08/17/21 0500 08/18/21 0545 08/19/21 0516  Weight: 81.1 kg 83.2 kg 87.2 kg    Examination:  General exam: NAD Respiratory system: Decreased breath sounds in the left base.  Some scattered coarse breath sounds.  No wheezing.  Fair air movement.  Speaking in full sentences. Cardiovascular system: RRR.  No murmurs rubs or gallops.  No JVD.  No lower extremity edema. Gastrointestinal system: Abdomen is nondistended, soft and nontender. No organomegaly or masses felt. Normal bowel sounds heard. Central nervous system: Alert and oriented. No focal neurological deficits. Extremities: Status post left AKA.  Status post amputation of right great and second toe. Skin: No rashes, lesions or ulcers Psychiatry: Judgement and insight appear fair. Mood & affect appropriate.     Data Reviewed: I have personally reviewed following labs and imaging studies  CBC: Recent Labs  Lab 08/17/21 0457 08/18/21 0434 08/19/21 0404 08/20/21 0319 08/21/21 0543  WBC 16.4* 22.6* 19.2* 19.1* 19.4*  NEUTROABS 13.4* 18.2* 15.2* 15.6* 16.0*  HGB 13.4 14.0 12.7* 12.7* 12.2*  HCT 37.8* 40.9 36.2* 36.3* 33.9*  MCV 83.3 84.5 83.2 82.5 86.9  PLT 164 252 222 225 261     Basic Metabolic Panel: Recent Labs  Lab 08/15/21 2014 08/15/21 2229 08/17/21 0457 08/18/21 0842 08/19/21 0404 08/20/21 0805 08/21/21 0543  NA 126*   < > 125* 129* 129* 131* 131*  K 2.8*   < > 3.9 3.4* 3.4* 3.7 4.5  CL 89*   < > 100  101 100 99 98  CO2 17*   < > 19* 17* 21* 20* 22  GLUCOSE 299*   < > 257* 217* 144* 171* 155*  BUN 11   < > 9 8 <5* <5* <5*  CREATININE 0.64   < > 0.56* 0.57* 0.52* 0.53* 0.46*  CALCIUM 8.1*   < > 7.5* 7.8* 7.4* 7.3* 7.3*  MG 1.4*  --   --   --   --  1.2* 1.8  PHOS  --   --   --  1.5*  --  2.4* 4.0   < > = values in this interval not displayed.     GFR: Estimated Creatinine  Clearance: 100.1 mL/min (A) (by C-G formula based on SCr of 0.46 mg/dL (L)).  Liver Function Tests: Recent Labs  Lab 08/15/21 2014 08/18/21 0842 08/19/21 0404 08/20/21 0805 08/21/21 0543  AST 15  --  20 18  --   ALT 14  --  14 13  --   ALKPHOS 78  --  74 83  --   BILITOT 1.6*  --  0.6 0.9  --   PROT 7.0  --  5.3* 4.9*  --   ALBUMIN 3.1* 2.4* 2.0* 1.7* 1.6*     CBG: Recent Labs  Lab 08/20/21 1241 08/20/21 1657 08/20/21 2027 08/21/21 0907 08/21/21 1205  GLUCAP 156* 123* 137* 196* 177*      Recent Results (from the past 240 hour(s))  Resp Panel by RT-PCR (Flu A&B, Covid) Nasopharyngeal Swab     Status: Abnormal   Collection Time: 08/13/21  6:28 AM   Specimen: Nasopharyngeal Swab; Nasopharyngeal(NP) swabs in vial transport medium  Result Value Ref Range Status   SARS Coronavirus 2 by RT PCR POSITIVE (A) NEGATIVE Final    Comment: (NOTE) SARS-CoV-2 target nucleic acids are DETECTED.  The SARS-CoV-2 RNA is generally detectable in upper respiratory specimens during the acute phase of infection. Positive results are indicative of the presence of the identified virus, but do not rule out bacterial infection or co-infection with other pathogens not detected by the test. Clinical correlation with patient history and other diagnostic information is necessary to determine patient infection status. The expected result is Negative.  Fact Sheet for Patients: EntrepreneurPulse.com.au  Fact Sheet for Healthcare Providers: IncredibleEmployment.be  This test is not yet approved or cleared by the [place] FDA and  has been authorized for detection and/or diagnosis of SARS-CoV-2 by FDA under an Emergency Use Authorization (EUA).  This EUA will remain in effect (meaning this test can be used) for the duration of  the COVID-19 declaration under Section 564(b)(1) of the A ct, 21 U.S.C. section 360bbb-3(b)(1), unless the authorization  is terminated or revoked sooner.     Influenza A by PCR NEGATIVE NEGATIVE Final   Influenza B by PCR NEGATIVE NEGATIVE Final    Comment: (NOTE) The Xpert Xpress SARS-CoV-2/FLU/RSV plus assay is intended as an aid in the diagnosis of influenza from Nasopharyngeal swab specimens and should not be used as a sole basis for treatment. Nasal washings and aspirates are unacceptable for Xpert Xpress SARS-CoV-2/FLU/RSV testing.  Fact Sheet for Patients: EntrepreneurPulse.com.au  Fact Sheet for Healthcare Providers: IncredibleEmployment.be  This test is not yet approved or cleared by the [place] FDA and has been authorized for detection and/or diagnosis of SARS-CoV-2 by FDA under an Emergency Use Authorization (EUA). This EUA will remain in effect (meaning this test can be used) for the duration of the COVID-19 declaration under Section 564(b)(1)  of the Act, 21 U.S.C. section 360bbb-3(b)(1), unless the authorization is terminated or revoked.  Performed at [facility], [place] [address], [place], [place] [address]   Culture, blood (routine x 2)     Status: Abnormal   Collection Time: 08/15/21  8:14 PM   Specimen: BLOOD LEFT FOREARM  Result Value Ref Range Status   Specimen Description BLOOD LEFT FOREARM  Final   Special Requests   Final    BOTTLES DRAWN AEROBIC ONLY Blood Culture adequate volume   Culture  Setup Time   Final    GRAM POSITIVE COCCI AEROBIC BOTTLE ONLY Gram Stain Report Called to,Read Back By and Verified With: B. FOLEY @ [phone] 08/17/21 BY STEPHTR CRITICAL RESULT CALLED TO, READ BACK BY AND VERIFIED WITH: PHARM D [place] ON [id number] AT 7 BY E.PARRISH    Culture (A)  Final    STAPHYLOCOCCUS CAPITIS THE SIGNIFICANCE OF ISOLATING THIS ORGANISM FROM A SINGLE SET OF BLOOD CULTURES WHEN MULTIPLE SETS ARE DRAWN IS UNCERTAIN. PLEASE NOTIFY THE MICROBIOLOGY DEPARTMENT WITHIN ONE WEEK IF SPECIATION AND SENSITIVITIES ARE  REQUIRED. Performed at [facility], [place] [address], [place], [place] [address]    Report Status 08/19/2021 FINAL  Final  Blood Culture ID Panel (Reflexed)     Status: Abnormal   Collection Time: 08/15/21  8:14 PM  Result Value Ref Range Status   Enterococcus faecalis NOT DETECTED NOT DETECTED Final   Enterococcus Faecium NOT DETECTED NOT DETECTED Final   Listeria monocytogenes NOT DETECTED NOT DETECTED Final   Staphylococcus species DETECTED (A) NOT DETECTED Final    Comment: CRITICAL RESULT CALLED TO, READ BACK BY AND VERIFIED WITH: PHARM D S.HURTH ON [id number] AT 1436 BY E.PARRISH    Staphylococcus aureus (BCID) NOT DETECTED NOT DETECTED Final   Staphylococcus epidermidis NOT DETECTED NOT DETECTED Final   Staphylococcus lugdunensis NOT DETECTED NOT DETECTED Final   Streptococcus species NOT DETECTED NOT DETECTED Final   Streptococcus agalactiae NOT DETECTED NOT DETECTED Final   Streptococcus pneumoniae NOT DETECTED NOT DETECTED Final   Streptococcus pyogenes NOT DETECTED NOT DETECTED Final   A.calcoaceticus-baumannii NOT DETECTED NOT DETECTED Final   Bacteroides fragilis NOT DETECTED NOT DETECTED Final   Enterobacterales NOT DETECTED NOT DETECTED Final   Enterobacter cloacae complex NOT DETECTED NOT DETECTED Final   Escherichia coli NOT DETECTED NOT DETECTED Final   Klebsiella aerogenes NOT DETECTED NOT DETECTED Final   Klebsiella oxytoca NOT DETECTED NOT DETECTED Final   Klebsiella pneumoniae NOT DETECTED NOT DETECTED Final   Proteus species NOT DETECTED NOT DETECTED Final   Salmonella species NOT DETECTED NOT DETECTED Final   Serratia marcescens NOT DETECTED NOT DETECTED Final   Haemophilus influenzae NOT DETECTED NOT DETECTED Final   Neisseria meningitidis NOT DETECTED NOT DETECTED Final   Pseudomonas aeruginosa NOT DETECTED NOT DETECTED Final   Stenotrophomonas maltophilia NOT DETECTED NOT DETECTED Final   Candida albicans NOT DETECTED NOT DETECTED Final   Candida  auris NOT DETECTED NOT DETECTED Final   Candida glabrata NOT DETECTED NOT DETECTED Final   Candida krusei NOT DETECTED NOT DETECTED Final   Candida parapsilosis NOT DETECTED NOT DETECTED Final   Candida tropicalis NOT DETECTED NOT DETECTED Final   Cryptococcus neoformans/gattii NOT DETECTED NOT DETECTED Final    Comment: Performed at [facility], [address], [place], [place] [address]  Culture, blood (routine x 2)     Status: None   Collection Time: 08/15/21  8:39 PM   Specimen: BLOOD  RIGHT HAND  Result Value Ref Range Status   Specimen Description BLOOD RIGHT HAND  Final   Special Requests   Final    BOTTLES DRAWN AEROBIC ONLY Blood Culture adequate volume   Culture   Final    NO GROWTH 5 DAYS Performed at [facility], [address] [place], [place] [address]    Report Status 08/20/2021 FINAL  Final  MRSA Next Gen by PCR, Nasal     Status: Abnormal   Collection Time: 08/16/21  2:15 AM   Specimen: Nasal Mucosa; Nasal Swab  Result Value Ref Range Status   MRSA by PCR Next Gen DETECTED (A) NOT DETECTED Final    Comment: RESULT CALLED TO, READ BACK BY AND VERIFIED WITH: KINDLEY,C @ 1540 ON 08/16/21 BY JUW (NOTE) The GeneXpert MRSA Assay (FDA approved for NASAL specimens only), is one component of a comprehensive MRSA colonization surveillance program. It is not intended to diagnose MRSA infection nor to guide or monitor treatment for MRSA infections. Test performance is not FDA approved in patients less than 54 years old. Performed at [facility], [address], [place], [place] [address]   Culture, blood (Routine X 2) w Reflex to ID Panel     Status: None (Preliminary result)   Collection Time: 08/18/21  8:41 AM   Specimen: BLOOD RIGHT HAND  Result Value Ref Range Status   Specimen Description   Final    BLOOD RIGHT HAND Performed at [facility], [address] [place], [place] [address]    Special Requests   Final    BOTTLES DRAWN AEROBIC AND ANAEROBIC Blood  Culture adequate volume Performed at [facility], [address] [place], [place] [address]    Culture  Setup Time   Final    GRAM VARIABLE COCCOBACILLI AEROBIC BOTTLE Gram Stain Report Called to,Read Back By and Verified WithBobetta Lime RN  [place] [id number] [facility] Organism ID to follow Performed at [facility], [place] [address], [place], [place] [address]    Culture   Final    NO GROWTH 3 DAYS Performed at [facility], [address], [place], [place] [address]    Report Status PENDING  Incomplete  Blood Culture ID Panel (Reflexed)     Status: None   Collection Time: 08/18/21  8:41 AM  Result Value Ref Range Status   Enterococcus faecalis NOT DETECTED NOT DETECTED Final   Enterococcus Faecium NOT DETECTED NOT DETECTED Final   Listeria monocytogenes NOT DETECTED NOT DETECTED Final   Staphylococcus species NOT DETECTED NOT DETECTED Final   Staphylococcus aureus (BCID) NOT DETECTED NOT DETECTED Final   Staphylococcus epidermidis NOT DETECTED NOT DETECTED Final   Staphylococcus lugdunensis NOT DETECTED NOT DETECTED Final   Streptococcus species NOT DETECTED NOT DETECTED Final   Streptococcus agalactiae NOT DETECTED NOT DETECTED Final   Streptococcus pneumoniae NOT DETECTED NOT DETECTED Final   Streptococcus pyogenes NOT DETECTED NOT DETECTED Final   A.calcoaceticus-baumannii NOT DETECTED NOT DETECTED Final   Bacteroides fragilis NOT DETECTED NOT DETECTED Final   Enterobacterales NOT DETECTED NOT DETECTED Final   Enterobacter cloacae complex NOT DETECTED NOT DETECTED Final   Escherichia coli NOT DETECTED NOT DETECTED Final   Klebsiella aerogenes NOT DETECTED NOT DETECTED Final   Klebsiella oxytoca NOT DETECTED NOT DETECTED Final   Klebsiella pneumoniae NOT DETECTED NOT DETECTED Final   Proteus species NOT DETECTED NOT DETECTED Final   Salmonella species NOT DETECTED NOT DETECTED Final   Serratia marcescens NOT DETECTED NOT  DETECTED Final   Haemophilus influenzae NOT DETECTED NOT  DETECTED Final   Neisseria meningitidis NOT DETECTED NOT DETECTED Final   Pseudomonas aeruginosa NOT DETECTED NOT DETECTED Final   Stenotrophomonas maltophilia NOT DETECTED NOT DETECTED Final   Candida albicans NOT DETECTED NOT DETECTED Final   Candida auris NOT DETECTED NOT DETECTED Final   Candida glabrata NOT DETECTED NOT DETECTED Final   Candida krusei NOT DETECTED NOT DETECTED Final   Candida parapsilosis NOT DETECTED NOT DETECTED Final   Candida tropicalis NOT DETECTED NOT DETECTED Final   Cryptococcus neoformans/gattii NOT DETECTED NOT DETECTED Final    Comment: Performed at [facility], [place] [address], [place], [place] [address]  Culture, blood (Routine X 2) w Reflex to ID Panel     Status: None (Preliminary result)   Collection Time: 08/18/21  8:42 AM   Specimen: Left Antecubital; Blood  Result Value Ref Range Status   Specimen Description LEFT ANTECUBITAL  Final   Special Requests   Final    BOTTLES DRAWN AEROBIC AND ANAEROBIC Blood Culture adequate volume   Culture   Final    NO GROWTH 3 DAYS Performed at [facility], [address], [place], [place] [address]    Report Status PENDING  Incomplete  Aerobic/Anaerobic Culture w Gram Stain (surgical/deep wound)     Status: None (Preliminary result)   Collection Time: 08/20/21  3:35 PM   Specimen: Fluid  Result Value Ref Range Status   Specimen Description FLUID  Final   Special Requests PLEURAL,LEFT  Final   Gram Stain NO WBC SEEN NO ORGANISMS SEEN   Final   Culture   Final    NO GROWTH < 24 HOURS Performed at [facility], [place] [address] [place], [place] [address]    Report Status PENDING  Incomplete          Radiology Studies: DG CHEST PORT 1 VIEW  Result Date: 08/21/2021 CLINICAL DATA:  Status post chest tube placement EXAM: PORTABLE CHEST 1 VIEW COMPARISON:  08/18/2021 FINDINGS: Unchanged borderline cardiomegaly. Mild pulmonary vascular congestion. Interval placement of left chest tube. Left  apical foreign body again seen. No pneumothorax. Left pleural effusion does not appear significantly changed. IMPRESSION: Interval placement of left chest tube. Loculated left pleural effusion does not appear significantly changed compared to prior radiograph. Electronically Signed   By: Miachel Roux M.D.   On: 08/21/2021 08:16   CT PERC PLEURAL DRAIN W/INDWELL CATH W/IMG GUIDE  Result Date: 08/21/2021 INDICATION: 61 year old gentleman with loculated left pleural effusion presents to IR for chest tube placement EXAM: CT-guided left chest tube placement MEDICATIONS: The patient is currently admitted to the hospital and receiving intravenous antibiotics. The antibiotics were administered within an appropriate time frame prior to the initiation of the procedure. ANESTHESIA/SEDATION: Moderate (conscious) sedation was employed during this procedure. A total of Versed 0.5 mg and Fentanyl 50 mcg was administered intravenously by the radiology nurse. Total intra-service moderate Sedation Time: 12 minutes. The patient's level of consciousness and vital signs were monitored continuously by radiology nursing throughout the procedure under my direct supervision. COMPLICATIONS: None immediate. PROCEDURE: Informed written consent was obtained from the patient after a thorough discussion of the procedural risks, benefits and alternatives. All questions were addressed. Maximal Sterile Barrier Technique was utilized including caps, mask, sterile gowns, sterile gloves, sterile drape, hand hygiene and skin antiseptic. A timeout was performed prior to the initiation of the procedure. Patient positioned supine on the procedure table. Left anterolateral chest  wall skin prepped and draped usual fashion. Following local lidocaine administration, the left pleural space was accessed with an 18 gauge needle utilizing CT guidance. 18 gauge needle removed over 0.035 inch guidewire. Serial dilation was performed and 14 [place] multipurpose  pigtail drain was inserted into the left pleural space. 20 mL sample was aspirated and sent for Gram stain and culture. Drain secured to skin with suture and connected to Pleur-Evac set at -20 cm H2O. IMPRESSION: Left chest tube (14 [place]) placed utilized CT guidance. Electronically Signed   By: Miachel Roux M.D.   On: 08/21/2021 08:14        Scheduled Meds:  amitriptyline  50 mg Oral QHS   aspirin EC  81 mg Oral Q breakfast   Chlorhexidine Gluconate Cloth  6 each Topical Daily   enoxaparin (LOVENOX) injection  40 mg Subcutaneous Q24H   feeding supplement  237 mL Oral BID BM   gabapentin  300 mg Oral QID   guaiFENesin  1,200 mg Oral BID   insulin aspart  0-20 Units Subcutaneous TID WC   insulin aspart  0-5 Units Subcutaneous QHS   insulin aspart  3 Units Subcutaneous TID WC   insulin glargine-yfgn  8 Units Subcutaneous BID   Ipratropium-Albuterol  1 puff Inhalation QID   living well with diabetes book   Does not apply Once   mometasone-formoterol  2 puff Inhalation BID   mupirocin ointment  1 application Nasal BID   pantoprazole  40 mg Oral Daily   Continuous Infusions:  sodium chloride 10 mL/hr at 08/21/21 0102   ampicillin-sulbactam (UNASYN) IV 3 g (08/21/21 7014)   magnesium sulfate bolus IVPB 4 g (08/21/21 1055)   vancomycin 166.7 mL/hr at 08/21/21 0529     LOS: 6 days    Time spent: 35 minutes    Irine Seal, MD [facility]   To contact the attending provider between 7A-7P or the covering provider during after hours 7P-7A, please log into the web site [email or web address] and access using universal [place] password for that web site. If you do not have the password, please call the hospital operator.  08/21/2021, 12:53 PM

## 2021-08-21 NOTE — Progress Notes (Signed)
Pharmacy Antibiotic Note ? ?Troy [name] is a 61 y.o. male admitted on 08/15/2021. Pharmacy has been consulted for Vancomycin for loculated Lt pleural effusion with pneumonia (he is also on Unasyn) . He failed thoracentesis and now s/p chest tube placement. ?-2/28 blood cultures show gram variable coccobacillli ?-2/25 cultures show staph capitis 1/2 (possible contaminant) ? ?Plan: ?Continue Vancomycin 1250mg  IV q 12h ?Monitor renal fxn, cultures, and clinical status ?Check vancomycin levels around the next dose ? ? ?Height: 5\' 10"  (177.8 cm) ?Weight: 87.2 kg (192 lb 3.9 oz) ?IBW/kg (Calculated) : 73 ?Based on Scr 0.8/CrCl 100; Vd 0.72, Ke 0.088; calc AUC 476 ? ?Temp (24hrs), Avg:98.5 ?F (36.9 ?C), Min:98.3 ?F (36.8 ?C), Max:98.9 ?F (37.2 ?C) ? ?Recent Labs  ?Lab 08/15/21 ?2014 08/15/21 ?2224 08/15/21 ?2229 08/17/21 ?0814 08/18/21 ?4818 08/18/21 ?5631 08/19/21 ?0404 08/20/21 ?4970 08/20/21 ?2637 08/21/21 ?0543  ?WBC 22.9*  --    < > 16.4* 22.6*  --  19.2* 19.1*  --  19.4*  ?CREATININE 0.64  --    < > 0.56*  --  0.57* 0.52*  --  0.53* 0.46*  ?LATICACIDVEN 2.2* 2.0*  --   --   --   --   --   --   --   --   ? < > = values in this interval not displayed.  ? ?  ?Estimated Creatinine Clearance: 100.1 mL/min (A) (by C-G formula based on SCr of 0.46 mg/dL (L)).   ? ?Allergies  ?Allergen Reactions  ? Hydromorphone Hcl Er Anaphylaxis and Other (See Comments)  ?  Allergic to DYE in extended-release tablet, can tolerate other forms of hydromorphone  ? Tapentadol Anaphylaxis, Swelling and Other (See Comments)  ?  THROAT ?ANGIOEDEMA ?Nucynta [Tapentadol Hydrochloride]  ? Exalamide Other (See Comments)  ?  UNSPECIFIED REACTION   ? ? ?Antimicrobials this admission: ?2/28 Vanc >> ?2/28 CTX >> 3/1 ?2/28 Azith >> 3/1  ?3/1 unasyn>> ? ?Dose adjustments this admission: ? ?Microbiology results: ?2/28 BCx >> ?2/26 MRSA PCR: Positive ?2/25 BCx >> Staph Capatis ? ? ?Thank you for allowing pharmacy to be a part of this patient?s  care. ? ?Hildred Laser, PharmD ?Clinical Pharmacist ?**Pharmacist phone directory can now be found on [email or web address] ([email or web address]).  Listed under [place]. ? ? ? ?

## 2021-08-22 ENCOUNTER — Inpatient Hospital Stay (HOSPITAL_COMMUNITY): Payer: Medicare HMO

## 2021-08-22 DIAGNOSIS — I509 Heart failure, unspecified: Secondary | ICD-10-CM

## 2021-08-22 DIAGNOSIS — J189 Pneumonia, unspecified organism: Secondary | ICD-10-CM | POA: Diagnosis not present

## 2021-08-22 DIAGNOSIS — N185 Chronic kidney disease, stage 5: Secondary | ICD-10-CM

## 2021-08-22 DIAGNOSIS — J918 Pleural effusion in other conditions classified elsewhere: Secondary | ICD-10-CM | POA: Diagnosis not present

## 2021-08-22 DIAGNOSIS — I10 Essential (primary) hypertension: Secondary | ICD-10-CM | POA: Diagnosis not present

## 2021-08-22 DIAGNOSIS — I998 Other disorder of circulatory system: Secondary | ICD-10-CM | POA: Clinically undetermined

## 2021-08-22 DIAGNOSIS — R239 Unspecified skin changes: Secondary | ICD-10-CM

## 2021-08-22 DIAGNOSIS — E119 Type 2 diabetes mellitus without complications: Secondary | ICD-10-CM

## 2021-08-22 DIAGNOSIS — J449 Chronic obstructive pulmonary disease, unspecified: Secondary | ICD-10-CM

## 2021-08-22 DIAGNOSIS — U071 COVID-19: Secondary | ICD-10-CM

## 2021-08-22 DIAGNOSIS — E1310 Other specified diabetes mellitus with ketoacidosis without coma: Secondary | ICD-10-CM | POA: Diagnosis not present

## 2021-08-22 HISTORY — DX: Other disorder of circulatory system: I99.8

## 2021-08-22 LAB — RENAL FUNCTION PANEL
Albumin: 1.6 g/dL — ABNORMAL LOW (ref 3.5–5.0)
Anion gap: 8 (ref 5–15)
BUN: <5 mg/dL — ABNORMAL LOW (ref 8–23)
CO2: 25 mmol/L (ref 22–32)
Calcium: 7.7 mg/dL — ABNORMAL LOW (ref 8.9–10.3)
Chloride: 99 mmol/L (ref 98–111)
Creatinine, Ser: 0.41 mg/dL — ABNORMAL LOW (ref 0.61–1.24)
GFR, Estimated: >60 mL/min (ref >60)
Glucose, Bld: 235 mg/dL — ABNORMAL HIGH (ref 70–99)
Phosphorus: 3.5 mg/dL (ref 2.5–4.6)
Potassium: 4.1 mmol/L (ref 3.5–5.1)
Sodium: 132 mmol/L — ABNORMAL LOW (ref 135–145)

## 2021-08-22 LAB — CBC
HCT: 37 % — ABNORMAL LOW (ref 39.0–52.0)
Hemoglobin: 13.2 g/dL (ref 13.0–17.0)
MCH: 29.6 pg (ref 26.0–34.0)
MCHC: 35.7 g/dL (ref 30.0–36.0)
MCV: 83 fL (ref 80.0–100.0)
Platelets: 279 10*3/uL (ref 150–400)
RBC: 4.46 MIL/uL (ref 4.22–5.81)
RDW: 13.1 % (ref 11.5–15.5)
WBC: 17.1 10*3/uL — ABNORMAL HIGH (ref 4.0–10.5)
nRBC: 0 % (ref 0.0–0.2)

## 2021-08-22 LAB — GLUCOSE, CAPILLARY
Glucose-Capillary: 212 mg/dL — ABNORMAL HIGH (ref 70–99)
Glucose-Capillary: 208 mg/dL — ABNORMAL HIGH (ref 70–99)
Glucose-Capillary: 252 mg/dL — ABNORMAL HIGH (ref 70–99)
Glucose-Capillary: 199 mg/dL — ABNORMAL HIGH (ref 70–99)

## 2021-08-22 LAB — MAGNESIUM: Magnesium: 1.9 mg/dL (ref 1.7–2.4)

## 2021-08-22 LAB — VANCOMYCIN, TROUGH: Vancomycin Tr: 6 ug/mL — ABNORMAL LOW (ref 15–20)

## 2021-08-22 MED ORDER — INSULIN ASPART 100 UNIT/ML IJ SOLN
6.0000 [IU] | Freq: Three times a day (TID) | INTRAMUSCULAR | Status: DC
  Administered 2021-08-22 – 2021-08-23 (×4): 6 [IU] via SUBCUTANEOUS

## 2021-08-22 MED ORDER — INSULIN GLARGINE-YFGN 100 UNIT/ML ~~LOC~~ SOLN
12.0000 [IU] | Freq: Two times a day (BID) | SUBCUTANEOUS | Status: DC
  Administered 2021-08-22 (×2): 12 [IU] via SUBCUTANEOUS
  Filled 2021-08-22 (×4): qty 0.12

## 2021-08-22 MED ORDER — HEPARIN (PORCINE) 25000 UT/250ML-% IV SOLN
2300.0000 [IU]/h | INTRAVENOUS | Status: DC
  Administered 2021-08-22: 1200 [IU]/h via INTRAVENOUS
  Administered 2021-08-23: 1500 [IU]/h via INTRAVENOUS
  Administered 2021-08-23: 2100 [IU]/h via INTRAVENOUS
  Administered 2021-08-24 – 2021-08-26 (×5): 2300 [IU]/h via INTRAVENOUS
  Filled 2021-08-22 (×8): qty 250

## 2021-08-22 MED ORDER — SODIUM CHLORIDE (PF) 0.9 % IJ SOLN
10.0000 mg | Freq: Once | INTRAMUSCULAR | Status: AC
  Administered 2021-08-22: 10 mg via INTRAPLEURAL
  Filled 2021-08-22: qty 10

## 2021-08-22 MED ORDER — STERILE WATER FOR INJECTION IJ SOLN
5.0000 mg | Freq: Once | RESPIRATORY_TRACT | Status: AC
  Administered 2021-08-22: 5 mg via INTRAPLEURAL
  Filled 2021-08-22: qty 5

## 2021-08-22 NOTE — Progress Notes (Signed)
Left chest tube opened to drain to LWS s/p medication dwell time x 1 hour. Blood tinged CT drainage noted to canister. Tolerated well.  ?
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
## 2021-08-22 NOTE — Progress Notes (Signed)
Hospital Consult    Reason for Consult: Mottling of the right forefoot Requesting Physician: Dr. Grandville Silos MRN #:  [id number]  History of Present Illness: This is a 61 y.o. male with longstanding diabetes mellitus, COPD, heart failure, renal failure, prior left above-knee amputation, right toe amputations, who presented with failure to thrive and found to be positive for COVID-19, DKA, pneumonia.  He had a loculated pleural effusion requiring left thoracentesis.  Currently undergoing tPA treatment to the loculated pleural effusion.  On exam this morning, there was concern of mottling at the patient's right forefoot.  Vascular surgery was called for further evaluation.  On exam, Troy [name] was lying comfortably in bed.  He denied motor deficits in the foot, other than baseline neuropathy from longstanding diabetes and end-stage renal disease.  He has noticed the mottling for roughly a week. He denies rest pain, wounds on the forefoot  Past Medical History:  Diagnosis Date   Acute renal failure ([place])    in setting of NSAID use and orthopedic surgery 2010   Anxiety and depression    Chronic diastolic CHF (congestive heart failure) ([place])    a. Echo 6/17: severe conc LVH, vigorous EF, EF 65-70%, no dynamic obstruction, no RWMA, Gr 1 DD, mild TR  //  b. LHC 8/17: no sig CAD, LVEDP 28   COPD (chronic obstructive pulmonary disease) (HCC)    Diabetic ulcer of left foot (HCC)    DM2 (diabetes mellitus, type 2) ([place])    Dysrhythmia    Family history of early CAD    Fatty liver    GERD (gastroesophageal reflux disease)    History of amputation of foot (HCC)    L trans-met // R toe   History of cardiac catheterization    a. [place] 2002: irregs  //  b. LHC in 8/17: no sig CAD, apical DK, hyperdynamic LV, LVEDP 28   History of kidney stones    History of nuclear stress test    a. Nuc 7/17: Overall, intermediate risk nuclear stress test secondary to small size of apical lateral defect and reduced ejection  fraction.  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    Kidney calculi    Palpitations    PTSD (post-traumatic stress disorder)    Tobacco abuse     Past Surgical History:  Procedure Laterality Date   AMPUTATION Left 01/03/2018   Procedure: LEFT MIDFOOT AMPUTATION/REVISION MIDAMPUTAION;  Surgeon: Mcarthur Rossetti, MD;  Location: [facility];  Service: Orthopedics;  Laterality: Left;   AMPUTATION Left 01/25/2018   Procedure: LEFT BELOW KNEE AMPUTATION;  Surgeon: [name], MD;  Location: [facility];  Service: Orthopedics;  Laterality: Left;   AMPUTATION Left 01/21/2021   Procedure: LEFT ABOVE KNEE AMPUTATION;  Surgeon: [name], MD;  Location: [facility];  Service: Orthopedics;  Laterality: Left;   AMPUTATION TOE Right 07/17/2019   Procedure: AMPUTATION RIGHT FOOT 2ND TOE;  Surgeon: Mcarthur Rossetti, MD;  Location: [facility];  Service: Orthopedics;  Laterality: Right;   APPLICATION OF WOUND VAC Left 01/21/2021   Procedure: APPLICATION OF WOUND VAC;  Surgeon: [name], MD;  Location: [facility];  Service: Orthopedics;  Laterality: Left;   BELOW KNEE LEG AMPUTATION Left 01/25/2018   CARDIAC CATHETERIZATION N/A 01/22/2016   Procedure: Left Heart Cath and Coronary Angiography;  Surgeon: Peter M Martinique, MD;  Location: [facility];  Service: Cardiovascular;  Laterality: N/A;   FOOT AMPUTATION Bilateral    I & D EXTREMITY Left 12/15/2017   Procedure: IRRIGATION AND DEBRIDEMENT LEFT FOOT ULCER;  Surgeon: Mcarthur Rossetti, MD;  Location: [facility];  Service: Orthopedics;  Laterality: Left;   I & D EXTREMITY Left 07/25/2020   Procedure: LEFT BELOW KNEE AMPUTATION ABSCESS EXCISION AND SKIN GRAFT;  Surgeon: [name], MD;  Location: [facility];  Service: Orthopedics;  Laterality: Left;   I & D EXTREMITY Left 08/22/2020   Procedure: DEBRIDEMENT LEFT BELOW KNEE  AMPUTATION AND APPLY KERECIS SKIN GRAFT;  Surgeon: [name], MD;  Location: [facility];  Service: Orthopedics;  Laterality: Left;   LITHOTRIPSY     TENDON LENGTHENING Bilateral    calf   TONSILLECTOMY      Allergies  Allergen Reactions   Hydromorphone Hcl Er Anaphylaxis and Other (See Comments)    Allergic to DYE in extended-release tablet, can tolerate other forms of hydromorphone   Tapentadol Anaphylaxis, Swelling and Other (See Comments)    THROAT ANGIOEDEMA Nucynta [Tapentadol Hydrochloride]   Exalamide Other (See Comments)    UNSPECIFIED REACTION     Prior to Admission medications   Medication Sig Start Date End Date Taking? Authorizing Provider  acetaminophen (TYLENOL) 325 MG tablet Take 2 tablets (650 mg total) by mouth every 6 (six) hours as needed for mild pain (or Fever >/= 101). 02/13/21  Yes Emokpae, Courage, MD  albuterol (VENTOLIN HFA) 108 (90 Base) MCG/ACT inhaler Inhale 2 puffs into the lungs every 4 (four) hours as needed for wheezing or shortness of breath. 02/13/21  Yes Emokpae, Courage, MD  ipratropium (ATROVENT) 0.02 % nebulizer solution Take 2.5 mLs (0.5 mg total) by nebulization 4 (four) times daily as needed for wheezing or shortness of breath. 02/13/21  Yes Emokpae, Courage, MD  naloxone (NARCAN) nasal spray 4 mg/0.1 mL Place 1 spray into the nose once. 05/30/20  Yes [provider]  oxyCODONE-acetaminophen (PERCOCET) 10-325 MG tablet Take 1 tablet by mouth every 4 (four) hours as needed for pain. 01/26/21  Yes Persons, Bevely Palmer, PA  amitriptyline (ELAVIL) 150 MG tablet Take 150 mg by mouth at bedtime. Patient not taking: Reported on 08/16/2021    [provider]  amLODipine (NORVASC) 10 MG tablet Take 1 tablet (10 mg total) by mouth daily. Patient not taking: Reported on 08/16/2021 02/13/21 02/13/22  Roxan Hockey, MD  ascorbic acid (VITAMIN C) 1000 MG tablet Take 1 tablet (1,000 mg total) by mouth daily. Patient not taking: Reported on 08/16/2021  01/26/21   Persons, Bevely Palmer, [place]  aspirin EC 81 MG EC tablet Take 1 tablet (81 mg total) by mouth daily with breakfast. Swallow whole. Patient not taking: Reported on 08/16/2021 02/13/21   Roxan Hockey, MD  Dapagliflozin-metFORMIN HCl ER 10-998 MG TB24 Take 1 tablet by mouth daily. Patient not taking: Reported on 08/16/2021    [provider]  Dulaglutide 1.5 MG/0.5ML SOPN Inject 1.5 mg into the skin every Sunday. Patient not taking: Reported on 08/16/2021 08/23/18   [provider]  gabapentin (NEURONTIN) 300 MG capsule Take 300 mg by mouth 4 (four) times daily. Patient not taking: Reported on 08/16/2021 12/04/20   [provider]  glucose blood (ONETOUCH VERIO) test strip USE TO CHECK BLOOD SUGAR 1-2 TIME(S) DAILY. 06/07/19   [provider]  Insulin Degludec (TRESIBA) 100 UNIT/ML SOLN Inject 24 Units into the skin  in the morning and at bedtime. Patient not taking: Reported on 08/16/2021    [provider]  Insulin Pen Needle (PEN NEEDLES) 32G X 4 MM MISC by Does not apply route. 12/18/19   [provider]  mupirocin ointment (BACTROBAN) 2 % Place into the nose 2 (two) times daily. Patient not taking: Reported on 08/16/2021 02/13/21   Roxan Hockey, MD  nicotine (NICODERM CQ - DOSED IN MG/24 HOURS) 14 mg/24hr patch Place 1 patch (14 mg total) onto the skin daily. Patient not taking: Reported on 08/16/2021 02/13/21 02/13/22  Roxan Hockey, MD  omeprazole (PRILOSEC) 40 MG capsule Take 40 mg by mouth daily. Patient not taking: Reported on 08/16/2021    [provider]  ondansetron (ZOFRAN-ODT) 4 MG disintegrating tablet Take 1 tablet (4 mg total) by mouth every 8 (eight) hours as needed for nausea or vomiting. Patient not taking: Reported on 08/16/2021 08/13/21   Horton, Barbette Hair, MD  polyethylene glycol (MIRALAX / GLYCOLAX) 17 g packet Take 17 g by mouth daily as needed for mild constipation. Patient not taking: Reported on 08/16/2021  02/13/21   Roxan Hockey, MD  potassium chloride SA (KLOR-CON M) 20 MEQ tablet Take 2 tablets (40 mEq total) by mouth daily. Patient not taking: Reported on 08/16/2021 08/13/21   Horton, Barbette Hair, MD  tiZANidine (ZANAFLEX) 4 MG tablet Take 1 tablet (4 mg total) by mouth at bedtime. Patient not taking: Reported on 08/16/2021 02/13/21   Roxan Hockey, MD  traZODone (DESYREL) 50 MG tablet Take 50-100 mg by mouth at bedtime. Patient not taking: Reported on 08/16/2021 07/31/20   [provider]    Social History   Socioeconomic History   Marital status: Married    Spouse name: Not on file   Number of children: 1   Years of education: Not on file   Highest education level: Not on file  Occupational History   Occupation: DISABLED  Tobacco Use   Smoking status: Every Day    Packs/day: 0.75    Years: 40.00    Pack years: 30.00    Types: Cigarettes   Smokeless tobacco: Never  Vaping Use   Vaping Use: Never used  Substance and Sexual Activity   Alcohol use: No   Drug use: No   Sexual activity: Not on file  Other Topics Concern   Not on file  Social History Narrative   Not on file   Social Determinants of Health   Financial Resource Strain: Not on file  Food Insecurity: Not on file  Transportation Needs: Not on file  Physical Activity: Not on file  Stress: Not on file  Social Connections: Not on file  Intimate Partner Violence: Not on file   Family History  Problem Relation Age of Onset   Leukemia Mother 99       died   Lung cancer Father 73       died   Heart attack Brother 68   Heart attack Brother 21   Hypertension Brother        X3   Hypertension Sister        X2   Diabetes Sister    Stroke Sister    Diabetes Sister    Other Brother        Car accident    ROS: Otherwise negative unless mentioned in HPI  Physical Examination  Vitals:   08/22/21 1203 08/22/21 1345  BP:    Pulse:  (!) 122  Resp: 18   Temp: 97.8  F (36.6 C)   SpO2:  94%    Body mass index is 27.58 kg/m.  General:  WDWN in NAD Gait: Not observed HENT: WNL, normocephalic Pulmonary: normal non-labored breathing, without Rales, rhonchi,  wheezing Cardiac: regular Abdomen: soft, NT/ND, no masses Skin: without rashes Vascular Exam/Pulses: 1+ palpable pulses Pt and DP, triphasic signals with doppler  Extremities: Mottling of the forefoot, cap refill appreciated Musculoskeletal: no muscle wasting or atrophy  Neurologic: A&O X 3;  No focal weakness or paresthesias are detected; speech is fluent/normal Psychiatric:  The pt has Normal affect. Lymph:  Unremarkable  CBC    Component Value Date/Time   WBC 17.1 (H) 08/22/2021 0332   RBC 4.46 08/22/2021 0332   HGB 13.2 08/22/2021 0332   HGB 15.5 03/24/2017 1123   HCT 37.0 (L) 08/22/2021 0332   HCT 46.1 03/24/2017 1123   PLT 279 08/22/2021 0332   PLT 166 03/24/2017 1123   MCV 83.0 08/22/2021 0332   MCV 86 03/24/2017 1123   MCH 29.6 08/22/2021 0332   MCHC 35.7 08/22/2021 0332   RDW 13.1 08/22/2021 0332   RDW 14.4 03/24/2017 1123   LYMPHSABS 1.3 08/21/2021 0543   LYMPHSABS WILL FOLLOW 07/02/2016 0936   MONOABS 1.5 (H) 08/21/2021 0543   EOSABS 0.1 08/21/2021 0543   EOSABS WILL FOLLOW 07/02/2016 0936   BASOSABS 0.1 08/21/2021 0543   BASOSABS WILL FOLLOW 07/02/2016 0936    BMET    Component Value Date/Time   NA 132 (L) 08/22/2021 0332   NA 137 03/24/2017 1123   K 4.1 08/22/2021 0332   CL 99 08/22/2021 0332   CO2 25 08/22/2021 0332   GLUCOSE 235 (H) 08/22/2021 0332   BUN <5 (L) 08/22/2021 0332   BUN 12 03/24/2017 1123   CREATININE 0.41 (L) 08/22/2021 0332   CREATININE 0.85 01/15/2016 0908   CALCIUM 7.7 (L) 08/22/2021 0332   GFRNONAA >60 08/22/2021 0332   GFRAA >60 07/13/2019 1454    COAGS: Lab Results  Component Value Date   INR 1.1 08/15/2021   INR 1.1 10/26/2018   INR 1.13 01/22/2016     Non-Invasive Vascular Imaging:   Pending     ASSESSMENT/PLAN: This is a 61 y.o. male with  asymptomatic mottling of the right forefoot.  Excellent signals in the foot in both dorsalis pedis and posterior tibial arteries.  I do not have an etiology for the mottling, that has been present since Monday.  Dorrien remains asymptomatic. COVID-19 has been known to cause arterial thrombosis.  But the current distribution, and chronicity of the lesion are not consistent with acute arterial thrombosis.  Recommend heparin drip when able (realizing the patient is on alteplase for loculated pleural effusion) Continue aspirin Warm the foot, no vascular surgery intervention at this time.  We will follow-up imaging, but would be surprised to see any arterial problems in the macrovasculature.  Please call if questions or concerns arise   Cassandria Santee MD MS [facility] [phone] 08/22/2021  2:43 PM

## 2021-08-22 NOTE — Progress Notes (Signed)
ANTICOAGULATION CONSULT NOTE - Initial Consult ? ?Pharmacy Consult for heparin ?Indication:  empiric arterial thrombus ? ?Allergies  ?Allergen Reactions  ? Hydromorphone Hcl Er Anaphylaxis and Other (See Comments)  ?  Allergic to DYE in extended-release tablet, can tolerate other forms of hydromorphone  ? Tapentadol Anaphylaxis, Swelling and Other (See Comments)  ?  THROAT ?ANGIOEDEMA ?Nucynta [Tapentadol Hydrochloride]  ? Exalamide Other (See Comments)  ?  UNSPECIFIED REACTION   ? ? ?Patient Measurements: ?Height: 5' 10"  (177.8 cm) ?Weight: 87.2 kg (192 lb 3.9 oz) ?IBW/kg (Calculated) : 73 ?Heparin Dosing Weight: 80kg ? ?Vital Signs: ?Temp: 97.8 ?F (36.6 ?C) (03/04 1203) ?Temp Source: Oral (03/04 1203) ?BP: 132/87 (03/04 9450) ?Pulse Rate: 122 (03/04 1345) ? ?Labs: ?Recent Labs  ?  08/20/21 ?0319 08/20/21 ?0805 08/21/21 ?3888 08/22/21 ?2800  ?HGB 12.7*  --  12.2* 13.2  ?HCT 36.3*  --  33.9* 37.0*  ?PLT 225  --  261 279  ?CREATININE  --  0.53* 0.46* 0.41*  ? ? ?Estimated Creatinine Clearance: 100.1 mL/min (A) (by C-G formula based on SCr of 0.41 mg/dL (L)). ? ? ?Medical History: ?Past Medical History:  ?Diagnosis Date  ? Acute renal failure ([place])   ? in setting of NSAID use and orthopedic surgery 2010  ? Anxiety and depression   ? Chronic diastolic CHF (congestive heart failure) ([place])   ? a. Echo 6/17: severe conc LVH, vigorous EF, EF 65-70%, no dynamic obstruction, no RWMA, Gr 1 DD, mild TR  //  b. LHC 8/17: no sig CAD, LVEDP 28  ? COPD (chronic obstructive pulmonary disease) ([place])   ? Diabetic ulcer of left foot ([place])   ? DM2 (diabetes mellitus, type 2) ([place])   ? Dysrhythmia   ? Family history of early CAD   ? Fatty liver   ? GERD (gastroesophageal reflux disease)   ? History of amputation of foot ([place])   ? L trans-met // R toe  ? History of cardiac catheterization   ? a. [place] 2002: irregs  //  b. LHC in 8/17: no sig CAD, apical DK, hyperdynamic LV, LVEDP 28  ? History of kidney stones   ? History of nuclear stress  test   ? a. Nuc 7/17: Overall, intermediate risk nuclear stress test secondary to small size of apical lateral defect and reduced ejection fraction.  EF 43%  ? HLD (hyperlipidemia)   ? HTN (hypertension)   ? 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 Injuries   ?  crushing injury to both his feet in February 2010.   ? Kidney calculi   ? Palpitations   ? PTSD (post-traumatic stress disorder)   ? Tobacco abuse   ? ? ?Medications:  ?Medications Prior to Admission  ?Medication Sig Dispense Refill Last Dose  ? acetaminophen (TYLENOL) 325 MG tablet Take 2 tablets (650 mg total) by mouth every 6 (six) hours as needed for mild pain (or Fever >/= 101). 12 tablet 0 unknown  ? albuterol (VENTOLIN HFA) 108 (90 Base) MCG/ACT inhaler Inhale 2 puffs into the lungs every 4 (four) hours as needed for wheezing or shortness of breath. 1 each 6 unknown  ? ipratropium (ATROVENT) 0.02 % nebulizer solution Take 2.5 mLs (0.5 mg total) by nebulization 4 (four) times daily as needed for wheezing or shortness of breath. 2.5 mL 1 unknown  ? naloxone (NARCAN) nasal spray 4 mg/0.1 mL Place 1 spray into the nose once.   unknown  ? oxyCODONE-acetaminophen (PERCOCET) 10-325 MG tablet Take  1 tablet by mouth every 4 (four) hours as needed for pain. 30 tablet 0 08/15/2021  ? amitriptyline (ELAVIL) 150 MG tablet Take 150 mg by mouth at bedtime. (Patient not taking: Reported on 08/16/2021)   Not Taking  ? amLODipine (NORVASC) 10 MG tablet Take 1 tablet (10 mg total) by mouth daily. (Patient not taking: Reported on 08/16/2021) 30 tablet 11 Not Taking  ? ascorbic acid (VITAMIN C) 1000 MG tablet Take 1 tablet (1,000 mg total) by mouth daily. (Patient not taking: Reported on 08/16/2021)   Not Taking  ? aspirin EC 81 MG EC tablet Take 1 tablet (81 mg total) by mouth daily with breakfast. Swallow whole. (Patient not taking: Reported on 08/16/2021) 30 tablet 11 Not Taking  ? Dapagliflozin-metFORMIN HCl ER 10-998 MG TB24 Take 1 tablet by mouth daily. (Patient not taking:  Reported on 08/16/2021)   Not Taking  ? Dulaglutide 1.5 MG/0.5ML SOPN Inject 1.5 mg into the skin every Sunday. (Patient not taking: Reported on 08/16/2021)   Not Taking  ? gabapentin (NEURONTIN) 300 MG capsule Take 300 mg by mouth 4 (four) times daily. (Patient not taking: Reported on 08/16/2021)   Not Taking  ? glucose blood (ONETOUCH VERIO) test strip USE TO CHECK BLOOD SUGAR 1-2 TIME(S) DAILY.     ? Insulin Degludec (TRESIBA) 100 UNIT/ML SOLN Inject 24 Units into the skin in the morning and at bedtime. (Patient not taking: Reported on 08/16/2021)   Not Taking  ? Insulin Pen Needle (PEN NEEDLES) 32G X 4 MM MISC by Does not apply route.     ? mupirocin ointment (BACTROBAN) 2 % Place into the nose 2 (two) times daily. (Patient not taking: Reported on 08/16/2021) 22 g 0 Not Taking  ? nicotine (NICODERM CQ - DOSED IN MG/24 HOURS) 14 mg/24hr patch Place 1 patch (14 mg total) onto the skin daily. (Patient not taking: Reported on 08/16/2021) 30 patch 0 Not Taking  ? omeprazole (PRILOSEC) 40 MG capsule Take 40 mg by mouth daily. (Patient not taking: Reported on 08/16/2021)   Not Taking  ? ondansetron (ZOFRAN-ODT) 4 MG disintegrating tablet Take 1 tablet (4 mg total) by mouth every 8 (eight) hours as needed for nausea or vomiting. (Patient not taking: Reported on 08/16/2021) 20 tablet 0 Not Taking  ? polyethylene glycol (MIRALAX / GLYCOLAX) 17 g packet Take 17 g by mouth daily as needed for mild constipation. (Patient not taking: Reported on 08/16/2021) 14 each 0 Not Taking  ? potassium chloride SA (KLOR-CON M) 20 MEQ tablet Take 2 tablets (40 mEq total) by mouth daily. (Patient not taking: Reported on 08/16/2021) 5 tablet 0 Not Taking  ? tiZANidine (ZANAFLEX) 4 MG tablet Take 1 tablet (4 mg total) by mouth at bedtime. (Patient not taking: Reported on 08/16/2021) 30 tablet 0 Not Taking  ? traZODone (DESYREL) 50 MG tablet Take 50-100 mg by mouth at bedtime. (Patient not taking: Reported on 08/16/2021)   Not Taking  ? ?Scheduled:  ?  amitriptyline  50 mg Oral QHS  ? aspirin EC  81 mg Oral Q breakfast  ? Chlorhexidine Gluconate Cloth  6 each Topical Daily  ? feeding supplement  237 mL Oral BID BM  ? gabapentin  300 mg Oral QID  ? guaiFENesin  1,200 mg Oral BID  ? insulin aspart  0-20 Units Subcutaneous TID WC  ? insulin aspart  0-5 Units Subcutaneous QHS  ? insulin aspart  6 Units Subcutaneous TID WC  ? insulin glargine-yfgn  12 Units Subcutaneous  BID  ? Ipratropium-Albuterol  1 puff Inhalation QID  ? living well with diabetes book   Does not apply Once  ? mometasone-formoterol  2 puff Inhalation BID  ? pantoprazole  40 mg Oral Daily  ? senna-docusate  1 tablet Oral BID  ? ? ?Assessment: ?Pt with COVID+ and risk for arterial thrombus per [facility]. Heparin has been ordered empirically. He got a dose of lovenox this AM. We will avoid any initial bolus.  ? ?Goal of Therapy:  ?Heparin level 0.3-0.7 units/ml ?Monitor platelets by anticoagulation protocol: Yes ?  ?Plan:  ?Dc lovenox ?Heparin 1200 units/hr ?Heparin level in AM then daily ? ?Onnie Boer, PharmD, BCIDP, AAHIVP, CPP ?Infectious Disease Pharmacist ?08/22/2021 4:42 PM ? ? ? ? ?

## 2021-08-22 NOTE — Progress Notes (Signed)
?  Left chest tube placement in IR 08/20/21 ?PCCM following Chest tube ?Using lytics in tubing ? ?OP 930 cc yesterday as per chart ? ?CXR this am:  IMPRESSION: ?1. Left-sided chest tube in unchanged position. Loculated left ?pleural effusion similar in appearance to the prior exam ? ? ?Will follow with P CCM ?

## 2021-08-22 NOTE — Progress Notes (Addendum)
Pharmacy Antibiotic Note ? ?Troy [name] is a 61 y.o. male admitted on 08/15/2021 with  Loculated left pleural effusion/left-sided multifocal pneumonia . Pharmacy has been consulted for Vancomycin dosing. ? ?Assessment: ?WBC count 17.1 downtrend from 19.4 on 3/3 ?Temp (24hrs), Avg:98.3 ?F (36.8 ?C), Min:98.1 ?F (36.7 ?C), Max:98.4 ?F (36.9 ?C) ?Peak 49, Trough 6, calculated AUC 488.5 (Goal AUC 400-550)  ? ?Plan: ?Continue Vancomycin 1250 mg IV every 12 hours. ?Monitor renal fxn, cultures, and clinical status. ?Recheck vancomycin levels as needed. ? ?Height: '5\' 10"'$  (177.8 cm) ?Weight: 87.2 kg (192 lb 3.9 oz) ?IBW/kg (Calculated) : 73 ? ?Recent Labs  ?Lab 08/15/21 ?2014 08/15/21 ?2224 08/15/21 ?2229 08/18/21 ?3086 08/18/21 ?5784 08/19/21 ?0404 08/20/21 ?6962 08/20/21 ?9528 08/21/21 ?4132 08/21/21 ?1725 08/22/21 ?4401  ?WBC 22.9*  --    < > 22.6*  --  19.2* 19.1*  --  19.4*  --  17.1*  ?CREATININE 0.64  --    < >  --  0.57* 0.52*  --  0.53* 0.46*  --  0.41*  ?LATICACIDVEN 2.2* 2.0*  --   --   --   --   --   --   --   --   --   ?[place]  --   --   --   --   --   --   --   --   --   --  6*  ?VANCOPEAK  --   --   --   --   --   --   --   --   --  49*  --   ? < > = values in this interval not displayed.  ?  ?Estimated Creatinine Clearance: 100.1 mL/min (A) (by C-G formula based on SCr of 0.41 mg/dL (L)).   ? ?Allergies  ?Allergen Reactions  ? Hydromorphone Hcl Er Anaphylaxis and Other (See Comments)  ?  Allergic to DYE in extended-release tablet, can tolerate other forms of hydromorphone  ? Tapentadol Anaphylaxis, Swelling and Other (See Comments)  ?  THROAT ?ANGIOEDEMA ?Nucynta [Tapentadol Hydrochloride]  ? Exalamide Other (See Comments)  ?  UNSPECIFIED REACTION   ? ? ?Antimicrobials this admission: ?Vanc 2/28>> ?CTX 2/28>> 3/1 ?Azith 2/28>> 3/1  ?unasyn 3/1>> ? ?Microbiology results: ?3/2 pleural fluid: ngtd ?2/28 BCx >> ngtd ?2/26 MRSA PCR: Positive ?2/25 BCx >> Staph Capatis ? ?Thank you for allowing pharmacy to be  a part of this patient?s care. ? ?Alene Mires Tamelia Michalowski ?08/22/2021 5:46 AM ? ?

## 2021-08-22 NOTE — Procedures (Signed)
Pleural Fibrinolytic Administration Procedure Note ? ?[name]  ?[id number]  ?[birth year] ? ?Date:08/22/21  ?Time:11:04 AM  ? ?Provider Performing:Troy [name]  ? ?Procedure: Pleural Fibrinolysis Subsequent day (65035) ? ?Indication(s) ?Fibrinolysis of complicated pleural effusion ? ?Consent ?Risks of the procedure as well as the alternatives and risks of each were explained to the patient and/or caregiver.  Consent for the procedure was obtained. ? ?Anesthesia ?None ? ? ?Time Out ?Verified patient identification, verified procedure, site/side was marked, verified correct patient position, special equipment/implants available, medications/allergies/relevant history reviewed, required imaging and test results available. ? ?Sterile Technique ?Hand hygiene, gloves ? ?Procedure Description ?Existing pleural catheter was cleaned and accessed in sterile manner.  '10mg'$  of tPA in 30cc of saline and '5mg'$  of dornase in 30cc of sterile water were injected into pleural space using existing pleural catheter at 10:57.  Catheter clamped and will remain clamped for 1 hour,  then placed back to suction at 12:00. ? ?Discussed with RN ? ? ?Complications/Tolerance ?None; patient tolerated the procedure well. ? ?EBL ?None ? ? ?Specimen(s) ?None  ? ? ?Troy Gum MSN, AGACNP-BC ?[facility] ?Amion for pager  ?08/22/2021, 11:10 AM ? ?

## 2021-08-22 NOTE — Progress Notes (Signed)
[facility] ? ? ?Patient name: Troy [name] Admit date: 08/15/2021  ?DOB: [birth year] LOS: 7  ?MRN: [id number] Consult date: 08/19/2021  ?Referring provider: Dr. Roderic Palau, [facility]: Pleural effusion  ? ? ?History:  ?61 yo male smoker presented to [facility] ER with weakness, fatigue, nausea, vomiting and diarrhea.  Found to be positive for COVID 19 infection.  Also found to have DKA and pneumonia.  Chest xray showed loculated left pleural effusion with left base consolidation/atelectasis.  IR consulted to assess for left thoracentesis but was unsuccessful.  PCCM consulted to assist with assessment of pleural effusion. ? ?Past medical history:  ?DM type 2, PAD s/p Lt AKA, Diastolic CHF, GERD, HTN, HLD, Anxiety, Depression, Fatty liver, Nephrolithiasis, PTSD, Insomnia, Neuropathy ? ?Significant events:  ?2/25 admit ?2/28 IR attempted Lt thoracentesis but unsuccessful ?3/2 chest tube placement ?3/3 first dose of intrapleural lytics; 360cc pleural fluid in atrium pre-lytics ?3/4 second dose tpa/dornase  ? ?Studies:  ?PFT 06/07/16 >> FEV1 2.52 (63%), FEV1% 84, TLC 5.26 (71%), DLCO 73% ?A1AT 06/07/16 >> 154, MS ?Echo 02/12/21 >> EF 70 to 75%, mod LVH, grade 1 DD, mild LA dilation ?CT angio chest 08/18/21 >> stable ventricular aneurysm, multiloculated moderate Lt pleural effusion with LLL ASD, changes of cirrhosis ? ? ?Interim history:  ?Pain from chest tube well controlled  ? ?>900 ml out from chest tube  ?  ? ?WBC 17. Cont on vanc, unasyn  ? ?Physical Exam:  ? ? ?General: Adult M reclined in bed NAD  ?HEENT: NCAT anicteric sclera  ?Neuro:AAOx4 following commands  ?CV: tachycardic, regular. R foot cap refill is >3 seconds.  ?PULM: even unlabored on RA. L sided chest tube to suction no air leak  ?GI: soft ndnt  ?Extremities: L AKA. Chronic toe amputations R foot. No edema,  ?Skin: R foot is cold and mottled. Cyanotic toes  ? ? ?Assessment/plan:  ? ?Loculated left sided pleural  effusion, with PNA and COVID-19  ?P ?-cont chest tube ?-repeat lytics 3/4 -- chest tube clamped x 1 hour, then back to suction  ?-AM CXR   ?-cont abx, narrow pending cx data  ?-so far no wbc or orgs on pleural cx  ? ?COPD with MS alpha 1 genotype ?P ?-cont Bds ? ? ?R foot cyanosis ?PAD ?-per wife and pt this is new as of a few days ago. Has hx amputations r/t PAD  ?P ?-ordering ABI, doppler  ? ? ?Will follow  ? ?Resolved hospital problems:  ?DKA ?Acute respiratory failure with hypoxia  ? ?Goals of care/Family discussions:  ?Code status: full  ? ?Labs:  ? ?CMP Latest Ref Rng & Units 08/22/2021 08/21/2021 08/20/2021  ?Glucose 70 - 99 mg/dL 235(H) 155(H) 171(H)  ?BUN 8 - 23 mg/dL <5(L) <5(L) <5(L)  ?Creatinine 0.61 - 1.24 mg/dL 0.41(L) 0.46(L) 0.53(L)  ?Sodium 135 - 145 mmol/L 132(L) 131(L) 131(L)  ?Potassium 3.5 - 5.1 mmol/L 4.1 4.5 3.7  ?Chloride 98 - 111 mmol/L 99 98 99  ?CO2 22 - 32 mmol/L 25 22 20(L)  ?Calcium 8.9 - 10.3 mg/dL 7.7(L) 7.3(L) 7.3(L)  ?Total Protein 6.5 - 8.1 g/dL - - 4.9(L)  ?Total Bilirubin 0.3 - 1.2 mg/dL - - 0.9  ?Alkaline Phos 38 - 126 U/L - - 83  ?AST 15 - 41 U/L - - 18  ?ALT 0 - 44 U/L - - 13  ? ? ?CBC Latest Ref Rng & Units 08/22/2021 08/21/2021 08/20/2021  ?WBC 4.0 - 10.5 K/uL 17.1(H) 19.4(H)  19.1(H)  ?Hemoglobin 13.0 - 17.0 g/dL 13.2 12.2(L) 12.7(L)  ?Hematocrit 39.0 - 52.0 % 37.0(L) 33.9(L) 36.3(L)  ?Platelets 150 - 400 K/uL 279 261 225  ? ? ? ?Eliseo Gum MSN, AGACNP-BC ?[facility] ?Amion for pager  ?08/22/2021, 10:44 AM ? ? ? ?

## 2021-08-23 ENCOUNTER — Inpatient Hospital Stay (HOSPITAL_COMMUNITY): Payer: Medicare HMO

## 2021-08-23 DIAGNOSIS — J189 Pneumonia, unspecified organism: Secondary | ICD-10-CM | POA: Diagnosis not present

## 2021-08-23 DIAGNOSIS — E1310 Other specified diabetes mellitus with ketoacidosis without coma: Secondary | ICD-10-CM | POA: Diagnosis not present

## 2021-08-23 DIAGNOSIS — J918 Pleural effusion in other conditions classified elsewhere: Secondary | ICD-10-CM | POA: Diagnosis not present

## 2021-08-23 DIAGNOSIS — I10 Essential (primary) hypertension: Secondary | ICD-10-CM | POA: Diagnosis not present

## 2021-08-23 DIAGNOSIS — U071 COVID-19: Secondary | ICD-10-CM | POA: Diagnosis not present

## 2021-08-23 LAB — CBC WITH DIFFERENTIAL/PLATELET
Abs Immature Granulocytes: 0.26 10*3/uL — ABNORMAL HIGH (ref 0.00–0.07)
Basophils Absolute: 0.1 10*3/uL (ref 0.0–0.1)
Basophils Relative: 1 %
Eosinophils Absolute: 0 10*3/uL (ref 0.0–0.5)
Eosinophils Relative: 0 %
HCT: 35.9 % — ABNORMAL LOW (ref 39.0–52.0)
Hemoglobin: 12.2 g/dL — ABNORMAL LOW (ref 13.0–17.0)
Immature Granulocytes: 2 %
Lymphocytes Relative: 6 %
Lymphs Abs: 0.9 10*3/uL (ref 0.7–4.0)
MCH: 29.4 pg (ref 26.0–34.0)
MCHC: 34 g/dL (ref 30.0–36.0)
MCV: 86.5 fL (ref 80.0–100.0)
Monocytes Absolute: 0.9 10*3/uL (ref 0.1–1.0)
Monocytes Relative: 6 %
Neutro Abs: 13.1 10*3/uL — ABNORMAL HIGH (ref 1.7–7.7)
Neutrophils Relative %: 85 %
Platelets: 301 10*3/uL (ref 150–400)
RBC: 4.15 MIL/uL — ABNORMAL LOW (ref 4.22–5.81)
RDW: 13.2 % (ref 11.5–15.5)
WBC: 15.3 10*3/uL — ABNORMAL HIGH (ref 4.0–10.5)
nRBC: 0 % (ref 0.0–0.2)

## 2021-08-23 LAB — GLUCOSE, CAPILLARY
Glucose-Capillary: 235 mg/dL — ABNORMAL HIGH (ref 70–99)
Glucose-Capillary: 197 mg/dL — ABNORMAL HIGH (ref 70–99)
Glucose-Capillary: 169 mg/dL — ABNORMAL HIGH (ref 70–99)
Glucose-Capillary: 138 mg/dL — ABNORMAL HIGH (ref 70–99)

## 2021-08-23 LAB — BASIC METABOLIC PANEL
Anion gap: 6 (ref 5–15)
BUN: 6 mg/dL — ABNORMAL LOW (ref 8–23)
CO2: 25 mmol/L (ref 22–32)
Calcium: 7.7 mg/dL — ABNORMAL LOW (ref 8.9–10.3)
Chloride: 100 mmol/L (ref 98–111)
Creatinine, Ser: 0.43 mg/dL — ABNORMAL LOW (ref 0.61–1.24)
GFR, Estimated: >60 mL/min (ref >60)
Glucose, Bld: 242 mg/dL — ABNORMAL HIGH (ref 70–99)
Potassium: 4.3 mmol/L (ref 3.5–5.1)
Sodium: 131 mmol/L — ABNORMAL LOW (ref 135–145)

## 2021-08-23 LAB — HEPARIN LEVEL (UNFRACTIONATED)
Heparin Unfractionated: <0.1 IU/mL — ABNORMAL LOW (ref 0.30–0.70)
Heparin Unfractionated: <0.1 IU/mL — ABNORMAL LOW (ref 0.30–0.70)
Heparin Unfractionated: <0.1 IU/mL — ABNORMAL LOW (ref 0.30–0.70)
Heparin Unfractionated: <0.1 IU/mL — ABNORMAL LOW (ref 0.30–0.70)

## 2021-08-23 LAB — MAGNESIUM: Magnesium: 1.7 mg/dL (ref 1.7–2.4)

## 2021-08-23 MED ORDER — SODIUM CHLORIDE 0.9 % IV SOLN
1.0000 g | Freq: Three times a day (TID) | INTRAVENOUS | Status: DC
  Administered 2021-08-23 – 2021-08-25 (×5): 1 g via INTRAVENOUS
  Filled 2021-08-23 (×6): qty 20

## 2021-08-23 MED ORDER — STERILE WATER FOR INJECTION IJ SOLN
5.0000 mg | Freq: Once | RESPIRATORY_TRACT | Status: AC
  Administered 2021-08-23: 5 mg via INTRAPLEURAL
  Filled 2021-08-23: qty 5

## 2021-08-23 MED ORDER — SODIUM CHLORIDE (PF) 0.9 % IJ SOLN
10.0000 mg | Freq: Once | INTRAMUSCULAR | Status: AC
  Administered 2021-08-23: 10 mg via INTRAPLEURAL
  Filled 2021-08-23: qty 10

## 2021-08-23 MED ORDER — HEPARIN BOLUS VIA INFUSION
2500.0000 [IU] | Freq: Once | INTRAVENOUS | Status: AC
  Administered 2021-08-23: 2500 [IU] via INTRAVENOUS
  Filled 2021-08-23: qty 2500

## 2021-08-23 MED ORDER — MAGNESIUM SULFATE 2 GM/50ML IV SOLN
2.0000 g | Freq: Once | INTRAVENOUS | Status: AC
  Administered 2021-08-23: 2 g via INTRAVENOUS
  Filled 2021-08-23: qty 50

## 2021-08-23 MED ORDER — LEVALBUTEROL HCL 0.63 MG/3ML IN NEBU: 0.6300 mg | INHALATION_SOLUTION | RESPIRATORY_TRACT | Status: DC | PRN

## 2021-08-23 MED ORDER — HEPARIN BOLUS VIA INFUSION
3000.0000 [IU] | Freq: Once | INTRAVENOUS | Status: AC
  Administered 2021-08-23: 3000 [IU] via INTRAVENOUS
  Filled 2021-08-23: qty 3000

## 2021-08-23 MED ORDER — INSULIN GLARGINE-YFGN 100 UNIT/ML ~~LOC~~ SOLN
16.0000 [IU] | Freq: Two times a day (BID) | SUBCUTANEOUS | Status: DC
  Administered 2021-08-23 – 2021-08-24 (×3): 16 [IU] via SUBCUTANEOUS
  Filled 2021-08-23 (×4): qty 0.16

## 2021-08-23 MED ORDER — LEVALBUTEROL HCL 0.63 MG/3ML IN NEBU
0.6300 mg | INHALATION_SOLUTION | Freq: Three times a day (TID) | RESPIRATORY_TRACT | Status: DC
  Administered 2021-08-24: 0.63 mg via RESPIRATORY_TRACT
  Filled 2021-08-23 (×2): qty 3

## 2021-08-23 MED ORDER — INSULIN ASPART 100 UNIT/ML IJ SOLN
8.0000 [IU] | Freq: Three times a day (TID) | INTRAMUSCULAR | Status: DC
  Administered 2021-08-23 – 2021-08-27 (×14): 8 [IU] via SUBCUTANEOUS

## 2021-08-23 MED ORDER — IPRATROPIUM BROMIDE 0.02 % IN SOLN
0.5000 mg | Freq: Three times a day (TID) | RESPIRATORY_TRACT | Status: DC
  Administered 2021-08-24: 0.5 mg via RESPIRATORY_TRACT
  Filled 2021-08-23 (×2): qty 2.5

## 2021-08-23 MED ORDER — AMOXICILLIN-POT CLAVULANATE 875-125 MG PO TABS
1.0000 | ORAL_TABLET | Freq: Two times a day (BID) | ORAL | Status: DC
  Administered 2021-08-23: 1 via ORAL
  Filled 2021-08-23: qty 1

## 2021-08-23 MED ORDER — IPRATROPIUM BROMIDE 0.02 % IN SOLN: 0.5000 mg | RESPIRATORY_TRACT | Status: DC | PRN

## 2021-08-23 MED ORDER — LEVALBUTEROL HCL 0.63 MG/3ML IN NEBU
0.6300 mg | INHALATION_SOLUTION | Freq: Four times a day (QID) | RESPIRATORY_TRACT | Status: DC
  Administered 2021-08-23 (×3): 0.63 mg via RESPIRATORY_TRACT
  Filled 2021-08-23 (×3): qty 3

## 2021-08-23 MED ORDER — AMOXICILLIN-POT CLAVULANATE 875-125 MG PO TABS: 1.0000 | ORAL_TABLET | Freq: Two times a day (BID) | ORAL | Status: DC

## 2021-08-23 MED ORDER — IPRATROPIUM BROMIDE 0.02 % IN SOLN
0.5000 mg | Freq: Four times a day (QID) | RESPIRATORY_TRACT | Status: DC
  Administered 2021-08-23 (×3): 0.5 mg via RESPIRATORY_TRACT
  Filled 2021-08-23 (×3): qty 2.5

## 2021-08-23 MED ORDER — BENZONATATE 100 MG PO CAPS
200.0000 mg | ORAL_CAPSULE | Freq: Three times a day (TID) | ORAL | Status: DC | PRN
  Administered 2021-08-23 – 2021-08-24 (×3): 200 mg via ORAL
  Filled 2021-08-23 (×3): qty 2

## 2021-08-23 NOTE — Progress Notes (Addendum)
ANTICOAGULATION CONSULT NOTE - Initial Consult ? ?Pharmacy Consult for IV Heparin ?Indication: empiric arterial thrombus ? ?Allergies  ?Allergen Reactions  ? Hydromorphone Hcl Er Anaphylaxis and Other (See Comments)  ?  Allergic to DYE in extended-release tablet, can tolerate other forms of hydromorphone  ? Tapentadol Anaphylaxis, Swelling and Other (See Comments)  ?  THROAT ?ANGIOEDEMA ?Nucynta [Tapentadol Hydrochloride]  ? Exalamide Other (See Comments)  ?  UNSPECIFIED REACTION   ? ? ?Patient Measurements: ?Height: 5' 10"  (177.8 cm) ?Weight: 87.2 kg (192 lb 3.9 oz) ?IBW/kg (Calculated) : 73 ?Heparin Dosing Weight: 80 kg ? ?Vital Signs: ?Temp: 98.3 ?F (36.8 ?C) (03/04 2322) ?Temp Source: Oral (03/04 2322) ?BP: 151/91 (03/04 2322) ?Pulse Rate: 110 (03/04 2322) ? ?Labs: ?Recent Labs  ?  08/21/21 ?7829 08/22/21 ?5621 08/23/21 ?0134  ?HGB 12.2* 13.2 12.2*  ?HCT 33.9* 37.0* 35.9*  ?PLT 261 279 301  ?HEPARINUNFRC  --   --  <0.10*  ?CREATININE 0.46* 0.41* 0.43*  ? ? ?Estimated Creatinine Clearance: 100.1 mL/min (A) (by C-G formula based on SCr of 0.43 mg/dL (L)). ? ? ?Medical History: ?Past Medical History:  ?Diagnosis Date  ? Acute renal failure ([place])   ? in setting of NSAID use and orthopedic surgery 2010  ? Anxiety and depression   ? Chronic diastolic CHF (congestive heart failure) ([place])   ? a. Echo 6/17: severe conc LVH, vigorous EF, EF 65-70%, no dynamic obstruction, no RWMA, Gr 1 DD, mild TR  //  b. LHC 8/17: no sig CAD, LVEDP 28  ? COPD (chronic obstructive pulmonary disease) ([place])   ? Diabetic ulcer of left foot ([place])   ? DM2 (diabetes mellitus, type 2) ([place])   ? Dysrhythmia   ? Family history of early CAD   ? Fatty liver   ? GERD (gastroesophageal reflux disease)   ? History of amputation of foot ([place])   ? L trans-met // R toe  ? History of cardiac catheterization   ? a. [place] 2002: irregs  //  b. LHC in 8/17: no sig CAD, apical DK, hyperdynamic LV, LVEDP 28  ? History of kidney stones   ? History of nuclear stress  test   ? a. Nuc 7/17: Overall, intermediate risk nuclear stress test secondary to small size of apical lateral defect and reduced ejection fraction.  EF 43%  ? HLD (hyperlipidemia)   ? HTN (hypertension)   ? 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 Injuries   ?  crushing injury to both his feet in February 2010.   ? Kidney calculi   ? Palpitations   ? PTSD (post-traumatic stress disorder)   ? Tobacco abuse   ? ? ?Assessment: ?Pt with COVID+ and risk for arterial thrombus per [facility]. Patient on 1200 units/hr heparin drip. Heparin level returned subtherapeutic at <0.10. ? ?Goal of Therapy:  ?Heparin level 0.3-0.7 units/ml ?Monitor platelets by anticoagulation protocol: Yes ?  ?Plan:  ?Give 2500 units bolus x 1 ?Increase heparin infusion to 1500 units/hr ?Check anti-Xa level in 8 hours and daily while on heparin ?Continue to monitor H&H and platelets ? ?Carma Lair, PharmD Candidate [phone] ?08/23/2021,3:12 AM ? ? ?

## 2021-08-23 NOTE — Plan of Care (Signed)
  Problem: Clinical Measurements: Goal: Respiratory complications will improve Outcome: Progressing Goal: Cardiovascular complication will be avoided Outcome: Progressing   
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
## 2021-08-23 NOTE — Procedures (Signed)
Pleural Fibrinolytic Administration Procedure Note ? ?Troy [name]  ?[id number]  ?[birth year] ? ?Date:08/23/21  ?Time:2:40 PM  ? ?Provider Performing:Karryn Kosinski C Tamala Julian  ? ?Procedure: Pleural Fibrinolysis Subsequent day (89211) ? ?Indication(s) ?Fibrinolysis of complicated pleural effusion ? ?Consent ?Risks of the procedure as well as the alternatives and risks of each were explained to the patient and/or caregiver.  Consent for the procedure was obtained. ? ? ?Anesthesia ?None ? ? ?Time Out ?Verified patient identification, verified procedure, site/side was marked, verified correct patient position, special equipment/implants available, medications/allergies/relevant history reviewed, required imaging and test results available. ? ? ?Sterile Technique ?Hand hygiene, gloves ? ? ?Procedure Description ?Existing pleural catheter was cleaned and accessed in sterile manner.  '10mg'$  of tPA in 30cc of saline and '5mg'$  of dornase in 30cc of sterile water were injected into pleural space using existing pleural catheter.  Catheter will be clamped for 1 hour and then placed back to suction. ? ? ?Complications/Tolerance ?None; patient tolerated the procedure well. ? ?EBL ?None ? ? ?Specimen(s) ?None ? ?

## 2021-08-23 NOTE — Progress Notes (Addendum)
Blood culture came back with 1/4 ROSEOMONAS SPECIES. It's a rare gram neg organism. It's usually sensitive to carbapenems, quinolones, and tetracyclines class. Since it's empiric therapy now until sensitivities are back, we will change his Augmentin to Merrem 1g IV q8 after discussing with Dr. Myna Hidalgo. Abx can be optimized when sensitivities resulted. Will continue vanc due to MRSA PCR+. ? ?Onnie Boer, PharmD, BCIDP, AAHIVP, CPP ?Infectious Disease Pharmacist ?08/23/2021 9:17 PM ? ? ?

## 2021-08-23 NOTE — Progress Notes (Signed)
[facility] ? ? ?Patient name: Troy [name] Admit date: 08/15/2021  ?DOB: 12/16/1960 LOS: 8  ?MRN: [id number] Consult date: 08/19/2021  ?Referring provider: Dr. Roderic Palau, [facility]: Pleural effusion  ? ? ?History:  ?61 yo male smoker presented to [facility] ER with weakness, fatigue, nausea, vomiting and diarrhea.  Found to be positive for COVID 19 infection.  Also found to have DKA and pneumonia.  Chest xray showed loculated left pleural effusion with left base consolidation/atelectasis.  IR consulted to assess for left thoracentesis but was unsuccessful.  PCCM consulted to assist with assessment of pleural effusion. ? ?Past medical history:  ?DM type 2, PAD s/p Lt AKA, Diastolic CHF, GERD, HTN, HLD, Anxiety, Depression, Fatty liver, Nephrolithiasis, PTSD, Insomnia, Neuropathy ? ?Significant events:  ?2/25 admit ?2/28 IR attempted Lt thoracentesis but unsuccessful ?3/2 chest tube placement ?3/3 first dose of intrapleural lytics; 360cc pleural fluid in [facility] pre-lytics ?3/4 second dose tpa/dornase  ? ?Studies:  ?PFT 06/07/16 >> FEV1 2.52 (63%), FEV1% 84, TLC 5.26 (71%), DLCO 73% ?A1AT 06/07/16 >> 154, MS ?Echo 02/12/21 >> EF 70 to 75%, mod LVH, grade 1 DD, mild LA dilation ?CT angio chest 08/18/21 >> stable ventricular aneurysm, multiloculated moderate Lt pleural effusion with LLL ASD, changes of cirrhosis ? ? ?Interim history:  ?Pain from chest tube well controlled  ? ?>900 ml out from chest tube  ?  ? ?WBC 17. Cont on vanc, unasyn  ? ?Physical Exam:  ? ?No distress ?Chest tube with serosanguinous output, no air leak or tidaling ?L AKA ?RLE ischemic changes stable ?Aox3, stoic ? ?Assessment/plan:  ? ?Loculated left sided pleural effusion, with PNA and COVID-19  ?P ?-Culture neg ?-Lytics x 3 (last one today).  Looking at his CXR I think we have posterior pocket drained, there may be a small anterior pocket that we just leave alone and treat with prolonged abx.  Will  check AM CT.  -Discussed w/ patient and wife ?-Switch unasyn to augmentin, duration TBD ? ?COPD with MS alpha 1 genotype ?P ?-cont Bds ? ?R foot cyanosis ?PAD ?No ischemic symptoms, stable ?ABI pending ?Monitor clinically, looks stable today ? ?Will follow  ? ? ?Erskine Emery MD PCCM ? ? ?

## 2021-08-23 NOTE — Progress Notes (Signed)
ANTICOAGULATION CONSULT NOTE - Follow Up Consult ? ?Pharmacy Consult for Heparin ?Indication:  empiric arterial thrombus ? ?Allergies  ?Allergen Reactions  ? Hydromorphone Hcl Er Anaphylaxis and Other (See Comments)  ?  Allergic to DYE in extended-release tablet, can tolerate other forms of hydromorphone  ? Tapentadol Anaphylaxis, Swelling and Other (See Comments)  ?  THROAT ?ANGIOEDEMA ?Nucynta [Tapentadol Hydrochloride]  ? Exalamide Other (See Comments)  ?  UNSPECIFIED REACTION   ? ? ?Patient Measurements: ?Height: '5\' 10"'$  (177.8 cm) ?Weight: 87.2 kg (192 lb 3.9 oz) ?IBW/kg (Calculated) : 73 ?Heparin Dosing Weight: 79.7 kg ? ?Vital Signs: ?Temp: 98.1 ?F (36.7 ?C) (03/05 2011) ?Temp Source: Oral (03/05 2011) ?BP: 137/97 (03/05 2011) ?Pulse Rate: 100 (03/05 2011) ? ?Labs: ?Recent Labs  ?  08/21/21 ?0543 08/22/21 ?6546 08/23/21 ?0134 08/23/21 ?0134 08/23/21 ?1034 08/23/21 ?1208 08/23/21 ?2106  ?HGB 12.2* 13.2 12.2*  --   --   --   --   ?HCT 33.9* 37.0* 35.9*  --   --   --   --   ?PLT 261 279 301  --   --   --   --   ?HEPARINUNFRC  --   --  <0.10*   < > <0.10* <0.10* <0.10*  ?CREATININE 0.46* 0.41* 0.43*  --   --   --   --   ? < > = values in this interval not displayed.  ? ? ? ?Estimated Creatinine Clearance: 100.1 mL/min (A) (by C-G formula based on SCr of 0.43 mg/dL (L)). ? ? ?Medications:  ?Scheduled:  ? amitriptyline  50 mg Oral QHS  ? aspirin EC  81 mg Oral Q breakfast  ? Chlorhexidine Gluconate Cloth  6 each Topical Daily  ? feeding supplement  237 mL Oral BID BM  ? gabapentin  300 mg Oral QID  ? guaiFENesin  1,200 mg Oral BID  ? insulin aspart  0-20 Units Subcutaneous TID WC  ? insulin aspart  0-5 Units Subcutaneous QHS  ? insulin aspart  8 Units Subcutaneous TID WC  ? insulin glargine-yfgn  16 Units Subcutaneous BID  ? ipratropium  0.5 mg Nebulization Q6H  ? levalbuterol  0.63 mg Nebulization Q6H  ? living well with diabetes book   Does not apply Once  ? mometasone-formoterol  2 puff Inhalation BID  ?  pantoprazole  40 mg Oral Daily  ? senna-docusate  1 tablet Oral BID  ? ?Infusions:  ? sodium chloride Stopped (08/22/21 2318)  ? heparin 1,800 Units/hr (08/23/21 1348)  ? magnesium sulfate bolus IVPB 2 g (08/23/21 2139)  ? meropenem (MERREM) IV    ? ? ?Assessment: ?61 yo M with COVID+ and risk for arterial thrombus per [facility].  Pharmacy consulted for heparin dosing.  ? ?Heparin level this morning undetectable at < 0.10, on 1500 units/hr.  Hgb 12.2, plt 301.  No line issues or signs/symptoms of bleeding noted per RN.  Unclear if AM heparin level was drawn appropriately, ordered redraw STAT.  ? ?Heparin came back undetectable again. We will rebolus and increase rate. Check level in AM ? ?Goal of Therapy:  ?Heparin level 0.3-0.7 units/ml ?Monitor platelets by anticoagulation protocol: Yes ?  ?Plan:  ?Heparin bolus 3000 units x 1 ?Increase heparin gtt to 2100 units/hr. ?Check AM heparin level.  ?Daily CBC, heparin level. ?Monitor for signs/symptoms of bleeding. ? ? ?Onnie Boer, PharmD, BCIDP, AAHIVP, CPP ?Infectious Disease Pharmacist ?08/23/2021 9:47 PM ? ? ? ? ? ?

## 2021-08-23 NOTE — Progress Notes (Signed)
ANTICOAGULATION CONSULT NOTE - Follow Up Consult ? ?Pharmacy Consult for Heparin ?Indication:  empiric arterial thrombus ? ?Allergies  ?Allergen Reactions  ? Hydromorphone Hcl Er Anaphylaxis and Other (See Comments)  ?  Allergic to DYE in extended-release tablet, can tolerate other forms of hydromorphone  ? Tapentadol Anaphylaxis, Swelling and Other (See Comments)  ?  THROAT ?ANGIOEDEMA ?Nucynta [Tapentadol Hydrochloride]  ? Exalamide Other (See Comments)  ?  UNSPECIFIED REACTION   ? ? ?Patient Measurements: ?Height: '5\' 10"'$  (177.8 cm) ?Weight: 87.2 kg (192 lb 3.9 oz) ?IBW/kg (Calculated) : 73 ?Heparin Dosing Weight: 79.7 kg ? ?Vital Signs: ?Temp: 98 ?F (36.7 ?C) (03/05 0341) ?Temp Source: Oral (03/05 0341) ?BP: 142/87 (03/05 0341) ?Pulse Rate: 135 (03/05 0832) ? ?Labs: ?Recent Labs  ?  08/21/21 ?0543 08/22/21 ?7673 08/23/21 ?0134 08/23/21 ?1034 08/23/21 ?1208  ?HGB 12.2* 13.2 12.2*  --   --   ?HCT 33.9* 37.0* 35.9*  --   --   ?PLT 261 279 301  --   --   ?HEPARINUNFRC  --   --  <0.10* <0.10* <0.10*  ?CREATININE 0.46* 0.41* 0.43*  --   --   ? ? ?Estimated Creatinine Clearance: 100.1 mL/min (A) (by C-G formula based on SCr of 0.43 mg/dL (L)). ? ? ?Medications:  ?Scheduled:  ? alteplase (TPA) for intrapleural administration  10 mg Intrapleural Once  ? And  ? pulmozyme (DORNASE) for intrapleural administration  5 mg Intrapleural Once  ? amitriptyline  50 mg Oral QHS  ? aspirin EC  81 mg Oral Q breakfast  ? Chlorhexidine Gluconate Cloth  6 each Topical Daily  ? feeding supplement  237 mL Oral BID BM  ? gabapentin  300 mg Oral QID  ? guaiFENesin  1,200 mg Oral BID  ? insulin aspart  0-20 Units Subcutaneous TID WC  ? insulin aspart  0-5 Units Subcutaneous QHS  ? insulin aspart  8 Units Subcutaneous TID WC  ? insulin glargine-yfgn  16 Units Subcutaneous BID  ? ipratropium  0.5 mg Nebulization Q6H  ? levalbuterol  0.63 mg Nebulization Q6H  ? living well with diabetes book   Does not apply Once  ? mometasone-formoterol  2 puff  Inhalation BID  ? pantoprazole  40 mg Oral Daily  ? senna-docusate  1 tablet Oral BID  ? ?Infusions:  ? sodium chloride Stopped (08/22/21 2318)  ? ampicillin-sulbactam (UNASYN) IV 3 g (08/23/21 1315)  ? heparin 1,500 Units/hr (08/23/21 4193)  ? vancomycin 166.7 mL/hr at 08/23/21 0356  ? ? ?Assessment: ?61 yo M with COVID+ and risk for arterial thrombus per [facility].  Pharmacy consulted for heparin dosing.  ? ?Heparin level this morning undetectable at < 0.10, on 1500 units/hr.  Hgb 12.2, plt 301.  No line issues or signs/symptoms of bleeding noted per RN.  Unclear if AM heparin level was drawn appropriately, ordered redraw STAT.  ? ?Repeat heparin level still undetectable at < 0.10 - confirmed level was drawn appropriately from right arm (heparin infusing in left PIV).  Will re-bolus and increase infusion rate.  ? ?Goal of Therapy:  ?Heparin level 0.3-0.7 units/ml ?Monitor platelets by anticoagulation protocol: Yes ?  ?Plan:  ?Heparin bolus 2500 units x 1.  ?Increase heparin gtt to 1800 units/hr. ?Check ~ 6 hr heparin level.  ?Daily CBC, heparin level. ?Monitor for signs/symptoms of bleeding. ? ? ?Vance Peper, PharmD ?PGY1 Pharmacy Resident ?Phone [phone] ?08/23/2021 1:32 PM  ? ?Please check AMION for all [place] phone numbers ?After 10:00 PM, call  [place] [phone] ? ? ? ?

## 2021-08-24 ENCOUNTER — Inpatient Hospital Stay (HOSPITAL_COMMUNITY): Payer: Medicare HMO

## 2021-08-24 DIAGNOSIS — J449 Chronic obstructive pulmonary disease, unspecified: Secondary | ICD-10-CM

## 2021-08-24 DIAGNOSIS — E1310 Other specified diabetes mellitus with ketoacidosis without coma: Secondary | ICD-10-CM | POA: Diagnosis not present

## 2021-08-24 DIAGNOSIS — J869 Pyothorax without fistula: Secondary | ICD-10-CM

## 2021-08-24 DIAGNOSIS — R7881 Bacteremia: Secondary | ICD-10-CM

## 2021-08-24 DIAGNOSIS — R230 Cyanosis: Secondary | ICD-10-CM

## 2021-08-24 DIAGNOSIS — E1129 Type 2 diabetes mellitus with other diabetic kidney complication: Secondary | ICD-10-CM

## 2021-08-24 DIAGNOSIS — J189 Pneumonia, unspecified organism: Secondary | ICD-10-CM | POA: Diagnosis not present

## 2021-08-24 DIAGNOSIS — I10 Essential (primary) hypertension: Secondary | ICD-10-CM | POA: Diagnosis not present

## 2021-08-24 DIAGNOSIS — I739 Peripheral vascular disease, unspecified: Secondary | ICD-10-CM

## 2021-08-24 DIAGNOSIS — J918 Pleural effusion in other conditions classified elsewhere: Secondary | ICD-10-CM | POA: Diagnosis not present

## 2021-08-24 DIAGNOSIS — U071 COVID-19: Secondary | ICD-10-CM | POA: Diagnosis not present

## 2021-08-24 LAB — BASIC METABOLIC PANEL
Anion gap: 8 (ref 5–15)
BUN: 6 mg/dL — ABNORMAL LOW (ref 8–23)
CO2: 24 mmol/L (ref 22–32)
Calcium: 7.6 mg/dL — ABNORMAL LOW (ref 8.9–10.3)
Chloride: 101 mmol/L (ref 98–111)
Creatinine, Ser: 0.49 mg/dL — ABNORMAL LOW (ref 0.61–1.24)
GFR, Estimated: >60 mL/min (ref >60)
Glucose, Bld: 244 mg/dL — ABNORMAL HIGH (ref 70–99)
Potassium: 4.2 mmol/L (ref 3.5–5.1)
Sodium: 133 mmol/L — ABNORMAL LOW (ref 135–145)

## 2021-08-24 LAB — GLUCOSE, CAPILLARY
Glucose-Capillary: 269 mg/dL — ABNORMAL HIGH (ref 70–99)
Glucose-Capillary: 228 mg/dL — ABNORMAL HIGH (ref 70–99)
Glucose-Capillary: 204 mg/dL — ABNORMAL HIGH (ref 70–99)
Glucose-Capillary: 165 mg/dL — ABNORMAL HIGH (ref 70–99)

## 2021-08-24 LAB — CBC
HCT: 31.1 % — ABNORMAL LOW (ref 39.0–52.0)
Hemoglobin: 10.5 g/dL — ABNORMAL LOW (ref 13.0–17.0)
MCH: 29.7 pg (ref 26.0–34.0)
MCHC: 33.8 g/dL (ref 30.0–36.0)
MCV: 88.1 fL (ref 80.0–100.0)
Platelets: 302 10*3/uL (ref 150–400)
RBC: 3.53 MIL/uL — ABNORMAL LOW (ref 4.22–5.81)
RDW: 13.3 % (ref 11.5–15.5)
WBC: 15.9 10*3/uL — ABNORMAL HIGH (ref 4.0–10.5)
nRBC: 0 % (ref 0.0–0.2)

## 2021-08-24 LAB — CULTURE, BLOOD (ROUTINE X 2)
Culture: NO GROWTH
Special Requests: ADEQUATE
Special Requests: ADEQUATE

## 2021-08-24 LAB — HEPARIN LEVEL (UNFRACTIONATED)
Heparin Unfractionated: 0.23 IU/mL — ABNORMAL LOW (ref 0.30–0.70)
Heparin Unfractionated: 0.48 IU/mL (ref 0.30–0.70)

## 2021-08-24 LAB — MAGNESIUM: Magnesium: 1.9 mg/dL (ref 1.7–2.4)

## 2021-08-24 MED ORDER — VANCOMYCIN HCL 1250 MG/250ML IV SOLN
1250.0000 mg | Freq: Two times a day (BID) | INTRAVENOUS | Status: DC
  Administered 2021-08-24 – 2021-08-25 (×3): 1250 mg via INTRAVENOUS
  Filled 2021-08-24 (×3): qty 250

## 2021-08-24 MED ORDER — IPRATROPIUM BROMIDE 0.02 % IN SOLN
0.5000 mg | Freq: Two times a day (BID) | RESPIRATORY_TRACT | Status: DC
  Administered 2021-08-24 – 2021-08-27 (×6): 0.5 mg via RESPIRATORY_TRACT
  Filled 2021-08-24 (×6): qty 2.5

## 2021-08-24 MED ORDER — LEVALBUTEROL HCL 0.63 MG/3ML IN NEBU
0.6300 mg | INHALATION_SOLUTION | Freq: Two times a day (BID) | RESPIRATORY_TRACT | Status: DC
  Administered 2021-08-24 – 2021-08-27 (×6): 0.63 mg via RESPIRATORY_TRACT
  Filled 2021-08-24 (×6): qty 3

## 2021-08-24 MED ORDER — INSULIN GLARGINE-YFGN 100 UNIT/ML ~~LOC~~ SOLN
4.0000 [IU] | Freq: Once | SUBCUTANEOUS | Status: AC
  Administered 2021-08-24: 4 [IU] via SUBCUTANEOUS
  Filled 2021-08-24: qty 0.04

## 2021-08-24 MED ORDER — INSULIN GLARGINE-YFGN 100 UNIT/ML ~~LOC~~ SOLN
20.0000 [IU] | Freq: Two times a day (BID) | SUBCUTANEOUS | Status: DC
  Administered 2021-08-24 – 2021-08-27 (×6): 20 [IU] via SUBCUTANEOUS
  Filled 2021-08-24 (×7): qty 0.2

## 2021-08-24 MED ORDER — HEPARIN BOLUS VIA INFUSION
2000.0000 [IU] | Freq: Once | INTRAVENOUS | Status: AC
  Administered 2021-08-24: 2000 [IU] via INTRAVENOUS
  Filled 2021-08-24: qty 2000
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
## 2021-08-24 NOTE — Progress Notes (Addendum)
ANTICOAGULATION CONSULT NOTE - Initial Consult ? ?Pharmacy Consult for heparin ?Indication:  empiric arterial thrombus ? ?Allergies  ?Allergen Reactions  ? Hydromorphone Hcl Er Anaphylaxis and Other (See Comments)  ?  Allergic to DYE in extended-release tablet, can tolerate other forms of hydromorphone  ? Tapentadol Anaphylaxis, Swelling and Other (See Comments)  ?  THROAT ?ANGIOEDEMA ?Nucynta [Tapentadol Hydrochloride]  ? Exalamide Other (See Comments)  ?  UNSPECIFIED REACTION   ? ? ?Patient Measurements: ?Height: 5' 10" (177.8 cm) ?Weight: 87.2 kg (192 lb 3.9 oz) ?IBW/kg (Calculated) : 73 ?Heparin Dosing Weight: 79.7 kg ? ?Vital Signs: ?Temp: 98.1 ?F (36.7 ?C) (03/05 2011) ?Temp Source: Oral (03/05 2011) ?BP: 137/97 (03/05 2011) ?Pulse Rate: 100 (03/05 2011) ? ?Labs: ?Recent Labs  ?  08/21/21 ?0543 08/22/21 ?7517 08/23/21 ?0134 08/23/21 ?1034 08/23/21 ?1208 08/23/21 ?2106 08/24/21 ?0158  ?HGB 12.2* 13.2 12.2*  --   --   --  10.5*  ?HCT 33.9* 37.0* 35.9*  --   --   --  31.1*  ?PLT 261 279 301  --   --   --  302  ?HEPARINUNFRC  --   --  <0.10*   < > <0.10* <0.10* 0.23*  ?CREATININE 0.46* 0.41* 0.43*  --   --   --   --   ? < > = values in this interval not displayed.  ? ? ?Estimated Creatinine Clearance: 100.1 mL/min (A) (by C-G formula based on SCr of 0.43 mg/dL (L)). ? ? ?Medical History: ?Past Medical History:  ?Diagnosis Date  ? Acute renal failure ([place])   ? in setting of NSAID use and orthopedic surgery 2010  ? Anxiety and depression   ? Chronic diastolic CHF (congestive heart failure) ([place])   ? a. Echo 6/17: severe conc LVH, vigorous EF, EF 65-70%, no dynamic obstruction, no RWMA, Gr 1 DD, mild TR  //  b. LHC 8/17: no sig CAD, LVEDP 28  ? COPD (chronic obstructive pulmonary disease) ([place])   ? Diabetic ulcer of left foot ([place])   ? DM2 (diabetes mellitus, type 2) ([place])   ? Dysrhythmia   ? Family history of early CAD   ? Fatty liver   ? GERD (gastroesophageal reflux disease)   ? History of amputation of foot ([place])    ? L trans-met // R toe  ? History of cardiac catheterization   ? a. [place] 2002: irregs  //  b. LHC in 8/17: no sig CAD, apical DK, hyperdynamic LV, LVEDP 28  ? History of kidney stones   ? History of nuclear stress test   ? a. Nuc 7/17: Overall, intermediate risk nuclear stress test secondary to small size of apical lateral defect and reduced ejection fraction.  EF 43%  ? HLD (hyperlipidemia)   ? HTN (hypertension)   ? 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 Injuries   ?  crushing injury to both his feet in February 2010.   ? Kidney calculi   ? Palpitations   ? PTSD (post-traumatic stress disorder)   ? Tobacco abuse   ? ? ?Assessment: ?61 yo M with COVID+ and risk for arterial thrombus per [facility]. Pharmacy consulted for heparin dosing. 61 yo M with COVID+ and risk for arterial thrombus per [facility]. Pharmacy consulted for heparin dosing. ? ?Heparin level returned subtherapeutic at 0.23, on 2100 units/hr.  Hgb 10.5, plt 302. ? ?Goal of Therapy:  ?Heparin level 0.3-0.7 units/ml ?Monitor platelets by anticoagulation protocol: Yes ?  ?Plan:  ?Heparin bolus 2000  units x1  ?Increase heparin infusion to 2300 units/hr ?Check anti-Xa level in 8 hours and daily while on heparin ?Continue to monitor H&H and platelets ? ?Carma Lair, PharmD Candidate [phone] ?08/24/2021,3:23 AM ? ? ?

## 2021-08-24 NOTE — Progress Notes (Signed)
ANTICOAGULATION CONSULT NOTE - Follow Up Consult ? ?Pharmacy Consult for Heparin ?Indication:  empiric arterial thrombus ? ?Allergies  ?Allergen Reactions  ? Hydromorphone Hcl Er Anaphylaxis and Other (See Comments)  ?  Allergic to DYE in extended-release tablet, can tolerate other forms of hydromorphone  ? Tapentadol Anaphylaxis, Swelling and Other (See Comments)  ?  THROAT ?ANGIOEDEMA ?Nucynta [Tapentadol Hydrochloride]  ? Exalamide Other (See Comments)  ?  UNSPECIFIED REACTION   ? ? ?Patient Measurements: ?Height: '5\' 10"'$  (177.8 cm) ?Weight: 87.2 kg (192 lb 3.9 oz) ?IBW/kg (Calculated) : 73 ?Heparin Dosing Weight: 79.7 kg ? ?Vital Signs: ?Temp: 98 ?F (36.7 ?C) (03/06 1201) ?Temp Source: Oral (03/06 0801) ?BP: 123/112 (03/06 1201) ?Pulse Rate: 116 (03/06 1201) ? ?Labs: ?Recent Labs  ?  08/22/21 ?0332 08/23/21 ?0134 08/23/21 ?1034 08/23/21 ?2106 08/24/21 ?0158 08/24/21 ?1010  ?HGB 13.2 12.2*  --   --  10.5*  --   ?HCT 37.0* 35.9*  --   --  31.1*  --   ?PLT 279 301  --   --  302  --   ?HEPARINUNFRC  --  <0.10*   < > <0.10* 0.23* 0.48  ?CREATININE 0.41* 0.43*  --   --  0.49*  --   ? < > = values in this interval not displayed.  ? ? ? ?Estimated Creatinine Clearance: 100.1 mL/min (A) (by C-G formula based on SCr of 0.49 mg/dL (L)). ? ? ?Medications:  ?Scheduled:  ? amitriptyline  50 mg Oral QHS  ? aspirin EC  81 mg Oral Q breakfast  ? Chlorhexidine Gluconate Cloth  6 each Topical Daily  ? feeding supplement  237 mL Oral BID BM  ? gabapentin  300 mg Oral QID  ? guaiFENesin  1,200 mg Oral BID  ? insulin aspart  0-20 Units Subcutaneous TID WC  ? insulin aspart  0-5 Units Subcutaneous QHS  ? insulin aspart  8 Units Subcutaneous TID WC  ? insulin glargine-yfgn  20 Units Subcutaneous BID  ? ipratropium  0.5 mg Nebulization BID  ? levalbuterol  0.63 mg Nebulization BID  ? living well with diabetes book   Does not apply Once  ? mometasone-formoterol  2 puff Inhalation BID  ? pantoprazole  40 mg Oral Daily  ? senna-docusate  1  tablet Oral BID  ? ?Infusions:  ? sodium chloride Stopped (08/22/21 2318)  ? heparin 2,300 Units/hr (08/24/21 0835)  ? meropenem (MERREM) IV 1 g (08/24/21 1433)  ? vancomycin 1,250 mg (08/24/21 0603)  ? ? ?Assessment: ?61 yo M with COVID+ and risk for arterial thrombus per [facility].  Pharmacy consulted for heparin dosing.  ? ?Heparin level 0.58 at goal on heparin drip 2300 uts/hr   Hgb drop slightly 13>10  plt stable 300s No line issues, Chest CT persistent pleural effusion ? hemorrhage ? ? ?Goal of Therapy:  ?Heparin level 0.3-0.7 units/ml ?Monitor platelets by anticoagulation protocol: Yes ?  ?Plan:  ?Continue  heparin gtt at 2300 units/hr. ?Daily CBC, heparin level. ?Monitor for signs/symptoms of bleeding. ? ? ?Bonnita Nasuti Pharm.D. CPP, BCPS ?Clinical Pharmacist ?[phone] ?08/24/2021 2:39 PM  ? ? ? ? ?
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
## 2021-08-24 NOTE — Progress Notes (Signed)
?   08/24/21 0801  ?Assess: MEWS Score  ?Temp 98 ?F (36.7 ?C)  ?BP 113/71  ?Pulse Rate 100  ?ECG Heart Rate (!) 123  ?Resp 20  ?SpO2 90 %  ?O2 Device Room Air  ?Assess: MEWS Score  ?MEWS Temp 0  ?MEWS Systolic 0  ?MEWS Pulse 2  ?MEWS RR 0  ?MEWS LOC 0  ?MEWS Score 2  ?MEWS Score Color Yellow  ?Assess: if the MEWS score is Yellow or Red  ?Were vital signs taken at a resting state? Yes  ?Focused Assessment No change from prior assessment  ?Early Detection of Sepsis Score *See Row Information* Low  ?MEWS guidelines implemented *See Row Information* No, vital signs rechecked  ?Treat  ?MEWS Interventions Administered scheduled meds/treatments  ?Pain Scale 0-10  ?Pain Score 7  ?Pain Type Acute pain  ?Pain Location Chest  ?Pain Orientation Medial  ?Pain Radiating Towards  ?(denies)  ?Pain Descriptors / Indicators Aching;Discomfort  ?Pain Frequency Intermittent  ?Pain Onset Other (Comment) ?(when pt coughs)  ?Pain Intervention(s) Medication (See eMAR)  ?Multiple Pain Sites No  ?Take Vital Signs  ?Increase Vital Sign Frequency  Yellow: Q 2hr X 2 then Q 4hr X 2, if remains yellow, continue Q 4hrs  ?Notify: Charge Nurse/RN  ?Name of Charge Nurse/RN Notified Gerald Stabs, RN  ?Date Charge Nurse/RN Notified 08/24/21  ?Time Charge Nurse/RN Notified 0745  ? ? ?

## 2021-08-24 NOTE — Progress Notes (Signed)
No output from chest tube for this RN on this shift ?

## 2021-08-24 NOTE — Progress Notes (Signed)
[facility] ? ? ?Patient name: Troy [name] Admit date: 08/15/2021  ?DOB: 18-Sep-1960 LOS: 9  ?MRN: [id number] Consult date: 08/19/2021  ?Referring provider: Dr. Roderic Palau, [facility]: Pleural effusion  ? ? ?History:  ?61 yo male smoker presented to [facility] ER with weakness, fatigue, nausea, vomiting and diarrhea.  Found to be positive for COVID 19 infection.  Also found to have DKA and pneumonia.  Chest xray showed loculated left pleural effusion with left base consolidation/atelectasis.  IR consulted to assess for left thoracentesis but was unsuccessful.  PCCM consulted to assist with assessment of pleural effusion. ? ?Past medical history:  ?DM type 2, PAD s/p Lt AKA, Diastolic CHF, GERD, HTN, HLD, Anxiety, Depression, Fatty liver, Nephrolithiasis, PTSD, Insomnia, Neuropathy ? ?Significant events:  ?2/25 admit ?2/28 IR attempted Lt thoracentesis but unsuccessful ?3/2 chest tube placement ?3/3 first dose of intrapleural lytics; 360cc pleural fluid in atrium pre-lytics ?3/4 second dose tpa/dornase  ?3/6 CT chest shows persistent L pleural effusion with likely interval hemorrhage ? ?Studies:  ?PFT 06/07/16 >> FEV1 2.52 (63%), FEV1% 84, TLC 5.26 (71%), DLCO 73% ?A1AT 06/07/16 >> 154, MS ?Echo 02/12/21 >> EF 70 to 75%, mod LVH, grade 1 DD, mild LA dilation ?CT angio chest 08/18/21 >> stable ventricular aneurysm, multiloculated moderate Lt pleural effusion with LLL ASD, changes of cirrhosis ?CT chest 3/6>Loculated left pleural effusion overall appears slightly smaller in size after chest tube placement, but now demonstrates some mixed intermediate density, consistent with interval hemorrhage. 2. Increasing multiple irregular foci of air within the medial left lower lobe, concerning for necrotizing pneumonia.  New peripheral predominant asymmetric ground-glass densities in both upper lobes and the superior segment of the right lower lobe, consistent with COVID  pneumonia. ? ? ?Interim history:  ? ?Remains stable on RA, ~500cc output from CT ? ?Physical Exam:  ? ?General:  well nourished M, laying in bed in no distress ?HEENT: MM pink/moist, pupils equal, sclera anicteric ?Neuro: awake, oriented, conversational  ?CV: s1s2 rrr, no m/r/g ?PULM:  decreased air entry bilateral bases on RA without wheezing or rhonchi, CT with serosanguinous  drainage, no tidaling or air leak ?GI: soft, non-tender ?Extremities: warm/dry, L BKA, no edema  ?Skin: no rashes or lesions ? ? ?Assessment/plan:  ? ?Loculated left sided pleural effusion, with PNA and COVID-19  ?CT chest with concern for necrotizing PNA and persistent effusion despite chest tube and lytics x3 ?-BC growing Roseomonas, seed by ID, continue Vanc/Meropenem  ?-repeat cultures sent ?-Discuss with CT surgery ? ?COPD with MS alpha 1 genotype ?P ?-continue bronchodilators ? ?R foot cyanosis ?PAD ?-stable clinically without ischemic symptoms ? ?PCCM will continue to follow with you ? ? ? ? ?Otilio Carpen Spiro Ausborn, PA-C ?[facility] ?See Amion for pager ?If no response to pager , please call [phone] until 7pm ?After 7:00 pm call Elink  [phone] ? ? ? ?

## 2021-08-24 NOTE — Progress Notes (Signed)
Lower extremity venous right and ABI study completed.  ? ?Please see CV Proc for preliminary results.  ? ?Darlin Coco, RDMS, RVT ? ?

## 2021-08-25 ENCOUNTER — Inpatient Hospital Stay (HOSPITAL_COMMUNITY): Payer: Medicare HMO

## 2021-08-25 DIAGNOSIS — I10 Essential (primary) hypertension: Secondary | ICD-10-CM | POA: Diagnosis not present

## 2021-08-25 DIAGNOSIS — J918 Pleural effusion in other conditions classified elsewhere: Secondary | ICD-10-CM | POA: Diagnosis not present

## 2021-08-25 DIAGNOSIS — E1310 Other specified diabetes mellitus with ketoacidosis without coma: Secondary | ICD-10-CM | POA: Diagnosis not present

## 2021-08-25 DIAGNOSIS — J189 Pneumonia, unspecified organism: Secondary | ICD-10-CM | POA: Diagnosis not present

## 2021-08-25 DIAGNOSIS — U071 COVID-19: Secondary | ICD-10-CM | POA: Diagnosis not present

## 2021-08-25 LAB — BASIC METABOLIC PANEL
Anion gap: 8 (ref 5–15)
BUN: 9 mg/dL (ref 8–23)
CO2: 26 mmol/L (ref 22–32)
Calcium: 7.9 mg/dL — ABNORMAL LOW (ref 8.9–10.3)
Chloride: 99 mmol/L (ref 98–111)
Creatinine, Ser: 0.56 mg/dL — ABNORMAL LOW (ref 0.61–1.24)
GFR, Estimated: >60 mL/min (ref >60)
Glucose, Bld: 173 mg/dL — ABNORMAL HIGH (ref 70–99)
Potassium: 4.1 mmol/L (ref 3.5–5.1)
Sodium: 133 mmol/L — ABNORMAL LOW (ref 135–145)

## 2021-08-25 LAB — CBC
HCT: 31.6 % — ABNORMAL LOW (ref 39.0–52.0)
Hemoglobin: 10.6 g/dL — ABNORMAL LOW (ref 13.0–17.0)
MCH: 29.4 pg (ref 26.0–34.0)
MCHC: 33.5 g/dL (ref 30.0–36.0)
MCV: 87.8 fL (ref 80.0–100.0)
Platelets: 344 10*3/uL (ref 150–400)
RBC: 3.6 MIL/uL — ABNORMAL LOW (ref 4.22–5.81)
RDW: 13.3 % (ref 11.5–15.5)
WBC: 19.5 10*3/uL — ABNORMAL HIGH (ref 4.0–10.5)
nRBC: 0 % (ref 0.0–0.2)

## 2021-08-25 LAB — HEPATITIS C ANTIBODY: HCV Ab: NONREACTIVE

## 2021-08-25 LAB — AEROBIC/ANAEROBIC CULTURE W GRAM STAIN (SURGICAL/DEEP WOUND)
Culture: NO GROWTH
Gram Stain: NONE SEEN

## 2021-08-25 LAB — HEPATITIS B SURFACE ANTIGEN: Hepatitis B Surface Ag: NONREACTIVE

## 2021-08-25 LAB — GLUCOSE, CAPILLARY
Glucose-Capillary: 128 mg/dL — ABNORMAL HIGH (ref 70–99)
Glucose-Capillary: 214 mg/dL — ABNORMAL HIGH (ref 70–99)
Glucose-Capillary: 179 mg/dL — ABNORMAL HIGH (ref 70–99)
Glucose-Capillary: 244 mg/dL — ABNORMAL HIGH (ref 70–99)

## 2021-08-25 LAB — HEPARIN LEVEL (UNFRACTIONATED): Heparin Unfractionated: 0.31 IU/mL (ref 0.30–0.70)

## 2021-08-25 LAB — MAGNESIUM: Magnesium: 1.5 mg/dL — ABNORMAL LOW (ref 1.7–2.4)

## 2021-08-25 LAB — HEPATITIS B CORE ANTIBODY, TOTAL: Hep B Core Total Ab: NONREACTIVE

## 2021-08-25 MED ORDER — DOXYCYCLINE HYCLATE 100 MG PO TABS
100.0000 mg | ORAL_TABLET | Freq: Two times a day (BID) | ORAL | Status: DC
  Administered 2021-08-25 – 2021-08-27 (×5): 100 mg via ORAL
  Filled 2021-08-25 (×5): qty 1

## 2021-08-25 MED ORDER — MAGNESIUM SULFATE 4 GM/100ML IV SOLN
4.0000 g | Freq: Once | INTRAVENOUS | Status: AC
  Administered 2021-08-25: 4 g via INTRAVENOUS
  Filled 2021-08-25: qty 100

## 2021-08-25 MED ORDER — SODIUM CHLORIDE 0.9 % IV SOLN
3.0000 g | Freq: Four times a day (QID) | INTRAVENOUS | Status: DC
  Administered 2021-08-25 – 2021-08-27 (×8): 3 g via INTRAVENOUS
  Filled 2021-08-25 (×12): qty 8

## 2021-08-25 MED ORDER — ADULT MULTIVITAMIN W/MINERALS CH
1.0000 | ORAL_TABLET | Freq: Every day | ORAL | Status: DC
  Administered 2021-08-25 – 2021-08-27 (×3): 1 via ORAL
  Filled 2021-08-25 (×3): qty 1

## 2021-08-25 MED ORDER — ENSURE MAX PROTEIN PO LIQD
11.0000 [oz_av] | Freq: Two times a day (BID) | ORAL | Status: DC
  Filled 2021-08-25 (×5): qty 330

## 2021-08-25 NOTE — TOC Initial Note (Signed)
Transition of Care (TOC) - Initial/Assessment Note  ? ? ?Patient Details  ?Name: Troy [name] ?MRN: [id number] ?Date of Birth: Nov 07, 1960 ? ?Transition of Care (TOC) CM/SW Contact:    ?Graves-Bigelow, Ocie Cornfield, RN ?Phone Number: ?08/25/2021, 12:53 PM ? ?Clinical Narrative:  Risk for readmission assessment completed. PTA patient was from home with spouse. Case Manager spoke with spouse and she works part time at a [employer]-no set hours. Spouse thinks patient may benefit from a rehab stay prior to transition home. Per spouse, patient has been to [facility] did not like the facility. Case Manager asked provider to place a PT/OT consult for recommendations. Patient has DME: rolling walker, wheelchair, scooter, and prosthesis for AKA-spouse states will not wear the prosthesis. Case Manager will continue to follow for disposition needs.  ? ? ?Expected Discharge Plan: [place] ?Barriers to Discharge: Continued Medical Work up ? ? ? ?Expected Discharge Plan and Services ?Expected Discharge Plan: [place] ?In-house Referral: NA ?Discharge Planning Services: CM Consult ?Post Acute Care Choice: Home Health ?Living arrangements for the past 2 months: [place] ?                ?  ?DME Agency: NA ?  ?  ?Prior Living Arrangements/Services ?Living arrangements for the past 2 months: [place] ?Lives with:: Spouse ?Patient language and need for interpreter reviewed:: Yes ?Do you feel safe going back to the place where you live?: Yes      ?Need for Family Participation in Patient Care: No (Comment) ?Care giver support system in place?: No (comment) ?  ?Criminal Activity/Legal Involvement Pertinent to Current Situation/Hospitalization: No - Comment as needed ? ?Activities of Daily Living ?Home Assistive Devices/Equipment: Prosthesis, Wheelchair, Eyeglasses ?ADL Screening (condition at time of admission) ?Patient's cognitive ability adequate to safely complete daily  activities?: Yes ?Is the patient deaf or have difficulty hearing?: No ?Does the patient have difficulty seeing, even when wearing glasses/contacts?: No ?Does the patient have difficulty concentrating, remembering, or making decisions?: No ?Patient able to express need for assistance with ADLs?: Yes ?Does the patient have difficulty dressing or bathing?: No ?Independently performs ADLs?: Yes (appropriate for developmental age) ?Does the patient have difficulty walking or climbing stairs?: No ?Weakness of Legs: None ?Weakness of Arms/Hands: None ? ?Permission Sought/Granted ?Permission sought to share information with : Family Supports, Customer service manager, Case Manager ?  ?   ?   ?   ?   ? ?Emotional Assessment ?Appearance:: Appears stated age ?Attitude/Demeanor/Rapport: Engaged ?Affect (typically observed): Appropriate ?Orientation: : Oriented to Self, Oriented to Place, Oriented to  Time, Oriented to Situation ?Alcohol / Substance Use: Not Applicable ?Psych Involvement: No (comment) ? ?Admission diagnosis:  Ketoacidosis due to type 2 diabetes mellitus ([place]) [E11.10] ?Patient Active Problem List  ? Diagnosis Date Noted  ? Lower limb ischemia: ??? 08/22/2021  ? Diabetic acidosis without coma ([place])   ? Hypokalemia   ? Hypomagnesemia   ? Hyponatremia   ? Pneumonia due to infectious organism   ? Parapneumonic effusion 08/18/2021  ? Bowel Ileus ([place]) 08/18/2021  ? ?? NASH Liver Cirrhosis ([place]) 08/18/2021  ? Ketoacidosis due to type 2 diabetes mellitus ([place]) 08/15/2021  ? COVID-19 virus infection 08/15/2021  ? S/P AKA (above knee amputation), left ([place]) 02/12/2021  ? Amputation of right great toe and 2nd Toe 02/12/2021  ? Sepsis due to left-sided neck cellulitis/MRSA 02/12/2021  ? Hypotensive episode   ? Elevated troponin   ?  SIRS (systemic inflammatory response syndrome) ([place]) 02/11/2021  ? Abscess of bursa of left ankle 01/21/2021  ? Dehiscence of amputation stump ([facility])   ? Ischemia of left BKA site [facility])    ? Abscess of leg without foot, left   ? Osteomyelitis of second toe of right foot ([place]) 07/12/2019  ? Below knee amputation status, left 01/25/2018  ? Wound dehiscence, surgical   ? Status post left foot surgery 12/15/2017  ? GERD (gastroesophageal reflux disease) 09/26/2017  ? Hyperlipidemia associated with type 2 diabetes mellitus ([place]) 07/08/2017  ? Snoring 06/07/2016  ? Chest tube in place 01/22/2016  ? Abnormal nuclear stress test 01/22/2016  ? 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 Complex regional pain syndrome I of lower limb 07/04/2012  ? COPD (chronic obstructive pulmonary disease) ([place]) 01/27/2011  ? Pre-operative cardiovascular examination 01/27/2011  ? Nonspecific abnormal electrocardiogram (ECG) (EKG) 01/27/2011  ? Murmur 01/27/2011  ? DM2 (diabetes mellitus, type 2) ([place])   ? HTN (hypertension)   ? HLD (hyperlipidemia)   ? Tobacco abuse   ? ?PCP:  Pcp, No ?Pharmacy:   ?[id number] [place], [place] - [id number]?Phone: [phone]: [phone]? ? ? ?Readmission Risk Interventions ?Readmission Risk Prevention Plan 08/25/2021  ?Transportation Screening Complete  ?Medication Review (Press photographer Complete  ?PCP or Specialist appointment within 3-5 days of discharge Complete  ?[facility] Consult Complete  ?SW Recovery Care/Counseling Consult Complete  ?Palliative Care Screening Not Applicable  ?S[place]agaNot Applicable  ?Some recent data might be hidden  ? ? ? ?

## 2021-08-25 NOTE — Progress Notes (Signed)
ANTICOAGULATION CONSULT NOTE - Follow Up Consult ? ?Pharmacy Consult for Heparin ?Indication:  empiric arterial thrombus ? ?Allergies  ?Allergen Reactions  ? Hydromorphone Hcl Er Anaphylaxis and Other (See Comments)  ?  Allergic to DYE in extended-release tablet, can tolerate other forms of hydromorphone  ? Tapentadol Anaphylaxis, Swelling and Other (See Comments)  ?  THROAT ?ANGIOEDEMA ?Nucynta [Tapentadol Hydrochloride]  ? Exalamide Other (See Comments)  ?  UNSPECIFIED REACTION   ? ? ?Patient Measurements: ?Height: '5\' 10"'$  (177.8 cm) ?Weight: 87.2 kg (192 lb 3.9 oz) ?IBW/kg (Calculated) : 73 ?Heparin Dosing Weight: 79.7 kg ? ?Vital Signs: ?Temp: 97.9 ?F (36.6 ?C) (03/07 0324) ?Temp Source: Oral (03/07 0324) ?BP: 157/82 (03/07 0324) ?Pulse Rate: 100 (03/07 0324) ? ?Labs: ?Recent Labs  ?  08/23/21 ?0134 08/23/21 ?1034 08/24/21 ?0158 08/24/21 ?1010 08/25/21 ?0232  ?HGB 12.2*  --  10.5*  --  10.6*  ?HCT 35.9*  --  31.1*  --  31.6*  ?PLT 301  --  302  --  344  ?HEPARINUNFRC <0.10*   < > 0.23* 0.48 0.31  ?CREATININE 0.43*  --  0.49*  --  0.56*  ? < > = values in this interval not displayed.  ? ? ? ?Estimated Creatinine Clearance: 100.1 mL/min (A) (by C-G formula based on SCr of 0.56 mg/dL (L)). ? ? ?Medications:  ?Scheduled:  ? amitriptyline  50 mg Oral QHS  ? aspirin EC  81 mg Oral Q breakfast  ? Chlorhexidine Gluconate Cloth  6 each Topical Daily  ? feeding supplement  237 mL Oral BID BM  ? gabapentin  300 mg Oral QID  ? guaiFENesin  1,200 mg Oral BID  ? insulin aspart  0-20 Units Subcutaneous TID WC  ? insulin aspart  0-5 Units Subcutaneous QHS  ? insulin aspart  8 Units Subcutaneous TID WC  ? insulin glargine-yfgn  20 Units Subcutaneous BID  ? ipratropium  0.5 mg Nebulization BID  ? levalbuterol  0.63 mg Nebulization BID  ? living well with diabetes book   Does not apply Once  ? mometasone-formoterol  2 puff Inhalation BID  ? pantoprazole  40 mg Oral Daily  ? senna-docusate  1 tablet Oral BID  ? ?Infusions:  ? sodium  chloride Stopped (08/22/21 2318)  ? heparin 2,300 Units/hr (08/25/21 1572)  ? meropenem (MERREM) IV 1 g (08/25/21 6203)  ? vancomycin 1,250 mg (08/25/21 0326)  ? ? ?Assessment: ?61 yo M with COVID+ and risk for arterial thrombus per [facility].  Pharmacy consulted for heparin dosing.  ? ?Heparin level 0.58 at goal on heparin drip 2300 uts/hr   Hgb drop slightly 13>10  plt stable 300s No line issues, Chest CT persistent pleural effusion ? Hemorrhage.  Serosanguinous drainage from chest tube per MD notes.  Hgb slightly down.  Heparin level at goal this AM. ? ?Goal of Therapy:  ?Heparin level 0.3-0.7 units/ml ?Monitor platelets by anticoagulation protocol: Yes ?  ?Plan:  ?Continue  heparin gtt at 2300 units/hr. ?Daily CBC, heparin level. ?Monitor for signs/symptoms of bleeding. ?F/u plans for heparin length of therapy? ? ?[place] Crane, Vena Austria, BCPS, BCCP ?Clinical Pharmacist ? 08/25/2021 7:51 AM  ? ?[facility] pharmacy phone numbers are listed on [email or web address] ? ? ? ? ? ?

## 2021-08-25 NOTE — Care Management Important Message (Signed)
Important Message ? ?Patient Details  ?Name: Troy [name] ?MRN: [id number] ?Date of Birth: [birth year] ? ? ?Medicare Important Message Given:  Yes ? ? ? ? ?Shelda Altes ?08/25/2021, 8:22 AM ?
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
## 2021-08-25 NOTE — Progress Notes (Signed)
Initial Nutrition Assessment ? ?DOCUMENTATION CODES:  ? ?Not applicable ? ?INTERVENTION:  ?Request new weight  ?Discontinue Ensure Enlive po BID, each supplement provides 350 kcal and 20 grams of protein. ?Ensure Max po BID, each supplement provides 150 kcal and 30 grams of protein  ?MVI with minerals daily ?Encourage adequate PO intake  ? ? ?NUTRITION DIAGNOSIS:  ? ?Increased nutrient needs related to acute illness (Covid-19) as evidenced by estimated needs. ? ?GOAL:  ? ?Patient will meet greater than or equal to 90% of their needs ? ?MONITOR:  ? ?PO intake, Supplement acceptance, Labs, Weight trends ? ?REASON FOR ASSESSMENT:  ? ?LOS ?  ? ?ASSESSMENT:  ? 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 ? ?2/28 - unsuccessful IR attempt for thoracentesis  ?3/2 - pigtail catheter placement  ?3/3, 3/4, 3/5 - thrombolytics therapy  ? ?Per chart review, thoracic surgery has been consulted for possible VATS procedure.  ? ?Pt is currently on a carb-modified diet with meal completions of 50-100%.  ? ?Met with pt at bedside. Per pt, pt endorses a good appetite today and denies any nausea, vomiting or constipation at this time. Pt reports that for breakfast today he ate two eggs, two pancakes, and one sausage patty. Pt states that he ate all of his dinner last night but unable to recall what he ate. Per pt, pt lives at home with his wife and uses an electric mobility scooter to get around. Pt reports that PTA, pt had a good appetite and that he typically eats 2-3 meals a day. For breakfast, pt reports that he eats an egg sandwich. For lunch or dinner, pt reports that his wife cooks at home and they typically have baked chicken, grilled corn, and some type of starch. Pt reports no recent significant weight  changes and unable to provide usual body weight when asked. Pt reports that he has been drinking the Ensure being provided to him in the hospital and tolerating well. Pt currently receiving Ensure Enlive (chocolate-flavored).  ? ?Discussed the importance of good nutrition and adequate PO intake for healing and to meet nutritional needs. Encouraged pt to continue eating consistent meals throughout the day since pt endorses a good appetite at this time. Discussed oral nutrition supplementation and pt agreeable to continuing to drink Ensure supplements. Dietetic intern to recommend switching from Ensure Enlive to Ensure Max due to pt having good PO intake at this time. Dietetic intern to recommend MVI supplementation to aid in intake of vitamins and minerals.  ? ?Admit wt: 79.7 kg  ?Current wt: 87.2 kg on 08/19/21 ? ?Labs reviewed and include: CBGs: 128-204 x 24 hours, A1C: 10.8, Na: 133, Creatinine: 0.56, Magnesium: 1.5, Corrected calcium: 9.8 ? ?Medications reviewed and include: ?Scheduled Meds: ? doxycycline  100 mg Oral Q12H  ? feeding supplement  237 mL Oral BID BM  ? insulin aspart  0-20 Units Subcutaneous TID WC  ? insulin aspart  0-5 Units Subcutaneous QHS  ? insulin aspart  8 Units Subcutaneous TID WC  ? insulin glargine-yfgn  20 Units Subcutaneous BID  ? pantoprazole  40 mg Oral Daily  ? senna-docusate  1 tablet Oral BID  ? ?Continuous Infusions: ? ampicillin-sulbactam (UNASYN) IV    ? magnesium sulfate bolus IVPB    ? ? ? ?  NUTRITION - FOCUSED PHYSICAL EXAM: ? ?Flowsheet Row Most Recent Value  ?Orbital Region No depletion  ?Upper Arm Region Moderate depletion  ?Thoracic and Lumbar Region No depletion  ?Buccal Region No depletion  ?Temple Region No depletion  ?Clavicle Bone Region No depletion  ?Clavicle and Acromion Bone Region No depletion  ?Scapular Bone Region No depletion  ?Dorsal Hand No depletion  ?Patellar Region Moderate depletion  ?Anterior Thigh Region Moderate depletion  ?Posterior Calf Region  Severe depletion  ?Edema (RD Assessment) None  ?Hair Reviewed  ?Eyes Reviewed  ?Mouth Reviewed  ?Skin Reviewed  ?Nails Reviewed  ? ?  ? ? ?Diet Order:   ?Diet Order   ? ?       ?  Diet Carb Modified Fluid consistency: Thin; Room service appropriate? Yes  Diet effective now       ?  ? ?  ?  ? ?  ? ? ?EDUCATION NEEDS:  ? ?Education needs have been addressed ? ?Skin:  Skin Assessment: Reviewed RN Assessment ? ?Last BM:  3/5 ? ?Height:  ? ?Ht Readings from Last 1 Encounters:  ?08/16/21 5' 10"  (1.778 m)  ? ? ?Weight:  ? ?Wt Readings from Last 1 Encounters:  ?08/19/21 87.2 kg  ? ? ?Ideal Body Weight:   Adjusted IBW: 69 kg  ? ?BMI:  Adjusted BMI: 30 kg/m2 ? ?Estimated Nutritional Needs:  ? ?Kcal:  2000 - 2200 ? ?Protein:  100 - 115 grams ? ?Fluid:  2 - 2.2 L ? ? ? ?Maryruth Hancock, Dietetic Intern ?08/25/2021 4:41 PM ?

## 2021-08-25 NOTE — Progress Notes (Addendum)
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 Pressure (mmHg) Index Waveform  Comment   +--------+------------------+-----+---------+--------+  Brachial 150                      triphasic           +--------+------------------+-----+---------+--------+  PTA      155                1.03  triphasic           +--------+------------------+-----+---------+--------+  DP  156                1.04  triphasic           +--------+------------------+-----+---------+--------+ +----+------------------+-----+--------+-------+  Left Lt Pressure (mmHg) Index Waveform Comment  +----+------------------+-----+--------+-------+  PTA                                    BKA      +----+------------------+-----+--------+-------+  DP                                     BKA      +----+------------------+-----+--------+-------+  Summary: Right: Resting right ankle-brachial index is within normal range. No evidence of significant right lower extremity arterial disease.   *See table(s) above for measurements and observations.  Electronically signed by Servando Snare MD on 08/24/2021 at 5:14:41 PM.    Final    VAS [name] LOWER EXTREMITY VENOUS (DVT)  Result Date: 08/24/2021  Lower Venous DVT Study Patient Name:  LORCAN SHELP [facility]  Date of Exam:   08/24/2021 Medical Rec #: [id number]      Accession #:    [id number] Date of Birth: Oct 01, 1960      Patient Gender: M Patient Age:   25 years Exam Location:  [facility] Procedure:      VAS [name] LOWER EXTREMITY VENOUS (DVT) Referring Phys: GRACE BOWSER --------------------------------------------------------------------------------  Indications: Cyanosis, mottling right leg.  Comparison Study: No prior studies. Performing Technologist: Darlin Coco RDMS, RVT  Examination Guidelines: A complete evaluation includes B-mode imaging, spectral Doppler, color Doppler, and power Doppler as needed of all accessible portions of each vessel. Bilateral testing is considered an integral part of a complete examination. Limited examinations for reoccurring indications may be performed as noted. The reflux portion of the exam is performed with the patient in reverse Trendelenburg.  +---------+---------------+---------+-----------+----------+--------------+  RIGHT     Compressibility Phasicity Spontaneity Properties Thrombus Aging  +---------+---------------+---------+-----------+----------+--------------+  CFV       Full            Yes       Yes                                    +---------+---------------+---------+-----------+----------+--------------+  SFJ       Full                                                             +---------+---------------+---------+-----------+----------+--------------+  FV Prox   Full                                                             +---------+---------------+---------+-----------+----------+--------------+  FV Mid    Full                                                              +---------+---------------+---------+-----------+----------+--------------+  FV Distal Full                                                             +---------+---------------+---------+-----------+----------+--------------+  PFV       Full                                                             +---------+---------------+---------+-----------+----------+--------------+  POP       Full            Yes       Yes                                    +---------+---------------+---------+-----------+----------+--------------+  PTV       Full                                                             +---------+---------------+---------+-----------+----------+--------------+  PERO      Full                                                             +---------+---------------+---------+-----------+----------+--------------+  Gastroc   Full                                                             +---------+---------------+---------+-----------+----------+--------------+   +----+---------------+---------+-----------+----------+--------------+  LEFT Compressibility Phasicity Spontaneity Properties Thrombus Aging  +----+---------------+---------+-----------+----------+--------------+  CFV  Full            Yes       Yes                                    +----+---------------+---------+-----------+----------+--------------+     Summary: RIGHT: - There is no evidence of deep vein thrombosis in the lower extremity.  - No cystic structure found in the popliteal fossa.  LEFT: - No evidence of common femoral vein obstruction.  *See table(s) above for measurements and observations. Electronically signed by Servando Snare MD on 08/24/2021 at 5:14:50 PM.    Final         Scheduled Meds:  amitriptyline  50 mg Oral QHS   aspirin EC  81 mg Oral Q breakfast   Chlorhexidine Gluconate Cloth  6 each Topical Daily   doxycycline  100 mg Oral Q12H   feeding supplement  237 mL Oral BID BM   gabapentin  300 mg Oral QID  guaiFENesin  1,200 mg Oral BID   insulin aspart  0-20 Units Subcutaneous TID WC   insulin aspart  0-5 Units Subcutaneous QHS   insulin aspart  8 Units Subcutaneous TID WC   insulin glargine-yfgn  20 Units Subcutaneous BID   ipratropium  0.5 mg Nebulization BID   levalbuterol  0.63 mg Nebulization BID   living well with diabetes book   Does not apply Once   mometasone-formoterol  2 puff Inhalation BID   pantoprazole  40 mg Oral Daily   senna-docusate  1 tablet Oral BID   Continuous Infusions:  sodium chloride Stopped (08/22/21 2318)   ampicillin-sulbactam (UNASYN) IV     heparin 2,300 Units/hr (08/25/21 0718)     LOS: 10 days    Time spent: 35 minutes    Irine Seal, MD [facility]   To contact the attending provider between 7A-7P or the covering provider during after hours 7P-7A, please log into the web site [email or web address] and access using universal [place] password for that web site. If you do not have the password, please call the hospital operator.  08/25/2021, 1:04 PM

## 2021-08-25 NOTE — Progress Notes (Signed)
[facility] ? ? ?Patient name: Troy [name] Admit date: 08/15/2021  ?DOB: 1960/10/17 LOS: 10  ?MRN: [id number] Consult date: 08/19/2021  ?Referring provider: Dr. Roderic Palau, [facility]: Pleural effusion  ? ? ?History:  ?61 yo male smoker presented to [facility] ER with weakness, fatigue, nausea, vomiting and diarrhea.  Found to be positive for COVID 19 infection.  Also found to have DKA and pneumonia.  Chest xray showed loculated left pleural effusion with left base consolidation/atelectasis.  IR consulted to assess for left thoracentesis but was unsuccessful.  PCCM consulted to assist with assessment of pleural effusion. ? ?Past medical history:  ?DM type 2, PAD s/p Lt AKA, Diastolic CHF, GERD, HTN, HLD, Anxiety, Depression, Fatty liver, Nephrolithiasis, PTSD, Insomnia, Neuropathy ? ?Significant events:  ?2/25 admit ?2/28 IR attempted Lt thoracentesis but unsuccessful ?3/2 chest tube placement ?3/3 first dose of intrapleural lytics; 360cc pleural fluid in [facility] pre-lytics ?3/4 second dose tpa/dornase  ?3/6 CT chest shows persistent L pleural effusion with likely interval hemorrhage ? ?Studies:  ?PFT 06/07/16 >> FEV1 2.52 (63%), FEV1% 84, TLC 5.26 (71%), DLCO 73% ?A1AT 06/07/16 >> 154, MS ?Echo 02/12/21 >> EF 70 to 75%, mod LVH, grade 1 DD, mild LA dilation ?CT angio chest 08/18/21 >> stable ventricular aneurysm, multiloculated moderate Lt pleural effusion with LLL ASD, changes of cirrhosis ?CT chest 3/6>Loculated left pleural effusion overall appears slightly smaller in size after chest tube placement, but now demonstrates some mixed intermediate density, consistent with interval hemorrhage. 2. Increasing multiple irregular foci of air within the medial left lower lobe, concerning for necrotizing pneumonia.  New peripheral predominant asymmetric ground-glass densities in both upper lobes and the superior segment of the right lower lobe, consistent with COVID  pneumonia. ? ? ?Interim history:  ? ?Stable on room air. No acute issues overnight. CXR with more confluent LUL opacity but overall similar to that noted on scout film from CT Chest yesterday. ? ?Physical Exam:  ? ?General:  well nourished M, laying in bed in no distress ?HEENT: MM pink/moist, pupils equal, sclera anicteric ?Neuro: awake, oriented, conversational  ?CV: s1s2 rrr, no m/r/g ?PULM:  decreased air entry bilateral bases on RA without wheezing or rhonchi, CT with serosanguinous  drainage, no tidaling or air leak after flushing ?GI: soft, non-tender ?Extremities: warm/dry, L BKA, no edema  ?Skin: no rashes or lesions ? ? ?Assessment/plan:  ? ?Loculated left sided pleural effusion, with PNA and COVID-19  ?CT chest with concern for necrotizing PNA and persistent effusion despite chest tube and lytics x3. Cleared tube of clot this morning with saline flush but still not tidaling on exam, will try to flush intermittently today to try to clear it.   ?-keep chest tube to suction -20 ?-start flushing with saline q8h ?-BC growing Roseomonas, seed by ID, continue Vanc/Meropenem  ?-repeat cultures sent ?-Discuss with CT surgery - trying to manage conservatively for now ?-repeat CXR tomorrow morning (ordered) ? ?COPD with MS alpha 1 genotype ?P ?-continue bronchodilators ? ?R foot cyanosis ?PAD ?-stable clinically without ischemic symptoms ? ?PCCM will continue to follow with you ? ? ? ?[place]  ?[place] ? ? ? ? ?

## 2021-08-26 ENCOUNTER — Inpatient Hospital Stay (HOSPITAL_COMMUNITY): Payer: Medicare HMO

## 2021-08-26 ENCOUNTER — Telehealth: Payer: Self-pay | Admitting: Acute Care

## 2021-08-26 DIAGNOSIS — I998 Other disorder of circulatory system: Secondary | ICD-10-CM

## 2021-08-26 LAB — CBC WITH DIFFERENTIAL/PLATELET
Abs Immature Granulocytes: 0.28 10*3/uL — ABNORMAL HIGH (ref 0.00–0.07)
Basophils Absolute: 0.1 10*3/uL (ref 0.0–0.1)
Basophils Relative: 0 %
Eosinophils Absolute: 0.1 10*3/uL (ref 0.0–0.5)
Eosinophils Relative: 1 %
HCT: 28.8 % — ABNORMAL LOW (ref 39.0–52.0)
Hemoglobin: 9.8 g/dL — ABNORMAL LOW (ref 13.0–17.0)
Immature Granulocytes: 2 %
Lymphocytes Relative: 11 %
Lymphs Abs: 1.9 10*3/uL (ref 0.7–4.0)
MCH: 31.3 pg (ref 26.0–34.0)
MCHC: 34 g/dL (ref 30.0–36.0)
MCV: 92 fL (ref 80.0–100.0)
Monocytes Absolute: 1 10*3/uL (ref 0.1–1.0)
Monocytes Relative: 6 %
Neutro Abs: 14.6 10*3/uL — ABNORMAL HIGH (ref 1.7–7.7)
Neutrophils Relative %: 80 %
Platelets: 323 10*3/uL (ref 150–400)
RBC: 3.13 MIL/uL — ABNORMAL LOW (ref 4.22–5.81)
RDW: 14.5 % (ref 11.5–15.5)
WBC: 18 10*3/uL — ABNORMAL HIGH (ref 4.0–10.5)
nRBC: 0 % (ref 0.0–0.2)

## 2021-08-26 LAB — RENAL FUNCTION PANEL
Albumin: 1.8 g/dL — ABNORMAL LOW (ref 3.5–5.0)
Anion gap: 8 (ref 5–15)
BUN: 10 mg/dL (ref 8–23)
CO2: 24 mmol/L (ref 22–32)
Calcium: 7.7 mg/dL — ABNORMAL LOW (ref 8.9–10.3)
Chloride: 98 mmol/L (ref 98–111)
Creatinine, Ser: 0.65 mg/dL (ref 0.61–1.24)
GFR, Estimated: >60 mL/min (ref >60)
Glucose, Bld: 223 mg/dL — ABNORMAL HIGH (ref 70–99)
Phosphorus: 3.5 mg/dL (ref 2.5–4.6)
Potassium: 4.2 mmol/L (ref 3.5–5.1)
Sodium: 130 mmol/L — ABNORMAL LOW (ref 135–145)

## 2021-08-26 LAB — HEPARIN LEVEL (UNFRACTIONATED): Heparin Unfractionated: 0.38 IU/mL (ref 0.30–0.70)

## 2021-08-26 LAB — GLUCOSE, CAPILLARY
Glucose-Capillary: 239 mg/dL — ABNORMAL HIGH (ref 70–99)
Glucose-Capillary: 144 mg/dL — ABNORMAL HIGH (ref 70–99)
Glucose-Capillary: 139 mg/dL — ABNORMAL HIGH (ref 70–99)
Glucose-Capillary: 161 mg/dL — ABNORMAL HIGH (ref 70–99)

## 2021-08-26 LAB — MAGNESIUM: Magnesium: 1.7 mg/dL (ref 1.7–2.4)

## 2021-08-26 LAB — HEPATITIS B SURFACE ANTIBODY, QUANTITATIVE: Hep B S AB Quant (Post): <3.1 m[IU]/mL — ABNORMAL LOW (ref >9.9)

## 2021-08-26 NOTE — Evaluation (Addendum)
Physical Therapy Evaluation Patient Details Name: Troy [name] MRN: [id number] DOB: 1960/08/11 Today's Date: 08/26/2021  History of Present Illness  Pt is a 61 y.o. male who presented 08/13/21 with hypokalemia in the setting of acute COVID-19 infection and DKA. S/p unsuccessful L thoracentesis 2/28. S/p L chest tube placed 3/2. S/p pleural fibrinolysis 3/3, 3/4, and 3/5. 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 PTA, he was mod I using a w/c or electric scooter for majority of mobility and hopping with a RW short household distances without his prosthesis (prefers not to use it). Pt lives with his wife, who cannot be present 24/7, in a 1-level house with ramped entrances. Currently, pt is impulsive and not very receptive to therapist education or cues with poor safety awareness, which the wife reports is his baseline cognition and mood. Pt also with generalized weakness, balance deficits, and decreased activity tolerance, placing him at risk for falls. Pt was able to hop 2x with a RW with minA to transfer bed > recliner today. Secondary to his increased weakness, falls risks, and impaired cognition with inability for 24/7 care at home, would recommend short-term rehab stay at SNF to improve his safety and independence prior to returning home. If pt can get 24/7 care at home, then he could go home with HHPT. Will continue to follow acutely.     Recommendations for follow up therapy are one component of a multi-disciplinary discharge planning process, led by the attending physician.  Recommendations may be updated based on patient status, additional functional criteria and insurance authorization.  Follow Up Recommendations Skilled nursing-short term rehab (<3 hours/day) (pending progress)    Assistance Recommended at Discharge Frequent or constant  Supervision/Assistance  Patient can return home with the following  A little help with walking and/or transfers;A little help with bathing/dressing/bathroom;Assistance with cooking/housework;Direct supervision/assist for medications management;Direct supervision/assist for financial management;Assist for transportation;Help with stairs or ramp for entrance    Equipment Recommendations None recommended by PT  Recommendations for Other Services       Functional Status Assessment Patient has had a recent decline in their functional status and demonstrates the ability to make significant improvements in function in a reasonable and predictable amount of time.     Precautions / Restrictions Precautions Precautions: Fall;Other (comment) Precaution Comments: prior L AKA and R toe amputations; monitor HR; L chest tube; COVID+ Restrictions Weight Bearing Restrictions: No      Mobility  Bed Mobility Overal bed mobility: Needs Assistance Bed Mobility: Supine to Sit     Supine to sit: Min guard, HOB elevated     General bed mobility comments: Min guard for safety, HOB elevated    Transfers Overall transfer level: Needs assistance Equipment used: Rolling walker (2 wheels) Transfers: Sit to/from Stand, Bed to chair/wheelchair/BSC Sit to Stand: Min assist, +2 safety/equipment   Step pivot transfers: Min assist, +2 safety/equipment       General transfer comment: Pt initially resistive to attempts to use RW to assess ability to hop between surfaces, but evenutally did come to stand and hop a couple times with RW to transfer to R bed > recliner, minA for stability, +2 for safety. Keeps R knee and hip flexed.    Ambulation/Gait Ambulation/Gait assistance: Min assist, +2 safety/equipment Gait Distance (Feet): 1 Feet Assistive device: Rolling walker (2 wheels) Gait  Pattern/deviations: Knee flexed in stance - right, Trunk flexed (hop-to) Gait velocity: reduced Gait velocity  interpretation: <1.31 ft/sec, indicative of household ambulator   General Gait Details: Pt hopping 2x to transfer to R bed > recliner with RW, keeping R knee and hip flexed. MinA for stability, +2 for safety.  Stairs            Wheelchair Mobility    Modified Rankin (Stroke Patients Only)       Balance Overall balance assessment: Needs assistance Sitting-balance support: No upper extremity supported, Feet supported Sitting balance-Leahy Scale: Good     Standing balance support: Bilateral upper extremity supported, During functional activity, Reliant on assistive device for balance Standing balance-Leahy Scale: Poor Standing balance comment: Reliant on RW and up to minA                             Pertinent Vitals/Pain Pain Assessment Pain Assessment: Faces Faces Pain Scale: No hurt Pain Intervention(s): Monitored during session    Home Living Family/patient expects to be discharged to:: Private residence Living Arrangements: Spouse/significant other Available Help at Discharge: Family;Available PRN/intermittently Type of Home: House Home Access: Ramped entrance       Home Layout: One level Home Equipment: Rolling Walker (2 wheels);BSC/3in1;Shower seat;Grab bars - tub/shower;Wheelchair - Press photographer;Other (comment) (bedside commode over top toilet to allow arm rests to push up on)      Prior Function Prior Level of Function : Independent/Modified Independent             Mobility Comments: Only hops short household distances with RW, does not use his prosthesis as it tends to make him sore superiorly on thigh/groin region. Otherwise uses w/c or scooter. ADLs Comments: Dresses and bathes himself.     Hand Dominance        Extremity/Trunk Assessment   Upper Extremity Assessment Upper Extremity Assessment: Defer to OT evaluation    Lower Extremity Assessment Lower Extremity Assessment: Generalized weakness (Able to detect touch  with accuracy throughout R leg, denies numbness/tingling; functional weakness noted)    Cervical / Trunk Assessment Cervical / Trunk Assessment: Normal  Communication   Communication: No difficulties  Cognition Arousal/Alertness: Awake/alert Behavior During Therapy: Flat affect, Impulsive, Agitated Overall Cognitive Status: History of cognitive impairments - at baseline                                 General Comments: Wife present, reporting pt is at his baseline cognitively and in regards to mood. Pt is impulsive and gets slightly agitated when asked to try alternative methods to transfer. Poor safety awareness, impulsively getting readyt o transfer before lines/leads were in place despite cues to wait. Not receptive to cues/education.        General Comments General comments (skin integrity, edema, etc.): HR 120s at rest, up to 130 with activity    Exercises     Assessment/Plan    PT Assessment Patient needs continued PT services  PT Problem List Decreased strength;Decreased activity tolerance;Decreased balance;Decreased mobility;Decreased cognition;Decreased safety awareness;Cardiopulmonary status limiting activity       PT Treatment Interventions DME instruction;Gait training;Functional mobility training;Therapeutic activities;Therapeutic exercise;Balance training;Neuromuscular re-education;Cognitive remediation;Patient/family education;Wheelchair mobility training    PT Goals (Current goals can be found in the Care Plan section)  Acute Rehab PT Goals Patient Stated Goal: to go home PT Goal Formulation: With patient/family Time For Goal  Achievement: 09/09/21 Potential to Achieve Goals: Good    Frequency Min 3X/week     Co-evaluation PT/OT/SLP Co-Evaluation/Treatment: Yes Reason for Co-Treatment: Necessary to address cognition/behavior during functional activity;For patient/therapist safety;To address functional/ADL transfers PT goals addressed during  session: Mobility/safety with mobility;Balance         AM-PAC PT "6 Clicks" Mobility  Outcome Measure Help needed turning from your back to your side while in a flat bed without using bedrails?: A Little Help needed moving from lying on your back to sitting on the side of a flat bed without using bedrails?: A Little Help needed moving to and from a bed to a chair (including a wheelchair)?: A Little Help needed standing up from a chair using your arms (e.g., wheelchair or bedside chair)?: A Little Help needed to walk in hospital room?: Total Help needed climbing 3-5 steps with a railing? : Total 6 Click Score: 14    End of Session   Activity Tolerance: Patient tolerated treatment well Patient left: in chair;with call bell/phone within reach;with chair alarm set;with family/visitor present Nurse Communication: Mobility status PT Visit Diagnosis: Unsteadiness on feet (R26.81);Other abnormalities of gait and mobility (R26.89);Muscle weakness (generalized) (M62.81);Difficulty in walking, not elsewhere classified (R26.2)    Time: 7846-9629 PT Time Calculation (min) (ACUTE ONLY): 19 min   Charges:   PT Evaluation $PT Eval Moderate Complexity: 1 Mod          Moishe Spice, PT, DPT Acute Rehabilitation Services  Pager: [phone] Office: [phone]   Orvan Falconer 08/26/2021, 9:59 AM

## 2021-08-26 NOTE — Progress Notes (Signed)
Site just cleaned ?Not unexpected oozing form old chest tube site ?Heparin stopped ?Will cont to re-enforce dressing ? ?Erick Colace ACNP-BC ?[place] ?Pager # [phone] OR # [phone] if no answer ? ?

## 2021-08-26 NOTE — Evaluation (Signed)
Occupational Therapy Evaluation Patient Details Name: Troy [name] MRN: [id number] DOB: Jun 06, 1961 Today's Date: 08/26/2021   History of Present Illness Pt is a 61 y.o. male who presented 08/13/21 with hypokalemia in the setting of acute COVID-19 infection and DKA. S/p unsuccessful L thoracentesis 2/28. S/p L chest tube placed 3/2. S/p pleural fibrinolysis 3/3, 3/4, and 3/5. 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 Patient does not drive or work and wife endorses minimal cognitive deficits at baseline. Currently, patient is requiring min-mod A for ADLs, and min guard for transfers to the recliner as patient is impulsive and minimally aggressive when attempting to participate in ambulation. Patient with noted cognitive deficits in session, as patient instructed to at least sit up in the chair for an hour, and with patient looking at the clock (time 8:52) and the patient stating, "So at 9 I can get back in bed." OT recommending SNF rehab due to need for increased assistance and increased supervision in comparison to his baseline. Wife is in agreement. OT will continue to follow acutely to address problem list outlined below.      Recommendations for follow up therapy are one component of a multi-disciplinary discharge planning process, led by the attending physician.  Recommendations may be updated based on patient status, additional functional criteria and insurance authorization.   Follow Up Recommendations  Skilled nursing-short term rehab (<3 hours/day)    Assistance Recommended at Discharge Frequent or constant Supervision/Assistance  Patient can return home with the following A little help with walking and/or transfers;A lot of help with bathing/dressing/bathroom;Assistance with cooking/housework;Direct  supervision/assist for medications management;Direct supervision/assist for financial management;Assist for transportation;Help with stairs or ramp for entrance    Functional Status Assessment  Patient has had a recent decline in their functional status and demonstrates the ability to make significant improvements in function in a reasonable and predictable amount of time.  Equipment Recommendations  None recommended by OT (Patient has all DME needed)    Recommendations for Other Services       Precautions / Restrictions Precautions Precautions: Fall;Other (comment) Precaution Comments: prior L AKA and R toe amputations; monitor HR; L chest tube; COVID+ Restrictions Weight Bearing Restrictions: No      Mobility Bed Mobility Overal bed mobility: Needs Assistance Bed Mobility: Supine to Sit     Supine to sit: Min guard, HOB elevated     General bed mobility comments: Min guard for safety, HOB elevated    Transfers Overall transfer level: Needs assistance Equipment used: Rolling walker (2 wheels) Transfers: Sit to/from Stand, Bed to chair/wheelchair/BSC Sit to Stand: Min assist, +2 safety/equipment     Step pivot transfers: Min assist, +2 safety/equipment     General transfer comment: Pt initially resistive to attempts to use RW to assess ability to hop between surfaces, but evenutally did come to stand and hop a couple times with RW to transfer to R bed > recliner, minA for stability, +2 for safety. Keeps R knee and hip flexed.      Balance Overall balance assessment: Needs assistance Sitting-balance support: No upper extremity supported, Feet supported Sitting balance-Leahy Scale: Good     Standing balance support: Bilateral upper extremity supported, During functional activity, Reliant on assistive device for balance Standing balance-Leahy Scale: Poor Standing balance comment: Reliant on RW and  up to minA                           ADL either performed  or assessed with clinical judgement   ADL Overall ADL's : Needs assistance/impaired Eating/Feeding: Set up   Grooming: Set up;Sitting   Upper Body Bathing: Sitting;Set up   Lower Body Bathing: Moderate assistance;Sitting/lateral leans   Upper Body Dressing : Set up;Sitting   Lower Body Dressing: Moderate assistance;Sitting/lateral leans   Toilet Transfer: Minimal assistance;Stand-pivot;Rolling walker (2 wheels) Toilet Transfer Details (indicate cue type and reason): Very impulsive when completing Toileting- Clothing Manipulation and Hygiene: Moderate assistance;Sitting/lateral lean;Sit to/from stand       Functional mobility during ADLs: Moderate assistance;Cueing for sequencing;Cueing for safety;Rolling walker (2 wheels) General ADL Comments: Patient presenting with decreased activity tolerance and impulsivity     Vision Baseline Vision/History: 1 Wears glasses Ability to See in Adequate Light: 0 Adequate Patient Visual Report: No change from baseline       Perception     Praxis      Pertinent Vitals/Pain Pain Assessment Pain Assessment: Faces Pain Score: 0-No pain Faces Pain Scale: No hurt Pain Intervention(s): Monitored during session, Repositioned     Hand Dominance Right   Extremity/Trunk Assessment Upper Extremity Assessment Upper Extremity Assessment: Overall WFL for tasks assessed   Lower Extremity Assessment Lower Extremity Assessment: Defer to PT evaluation   Cervical / Trunk Assessment Cervical / Trunk Assessment: Normal   Communication Communication Communication: No difficulties   Cognition Arousal/Alertness: Awake/alert Behavior During Therapy: Flat affect, Impulsive, Agitated Overall Cognitive Status: History of cognitive impairments - at baseline                                 General Comments: Wife present, reporting pt is at his baseline cognitively and in regards to mood. Pt is impulsive and gets slightly agitated when  asked to try alternative methods to transfer. Poor safety awareness, impulsively getting readyt o transfer before lines/leads were in place despite cues to wait. Not receptive to cues/education.     General Comments  HR 120s at rest, up to 130 with activity    Exercises     Shoulder Instructions      Home Living Family/patient expects to be discharged to:: Private residence Living Arrangements: Spouse/significant other Available Help at Discharge: Family;Available PRN/intermittently Type of Home: House Home Access: Ramped entrance     Home Layout: One level     Bathroom Shower/Tub: Occupational psychologist: Handicapped height (with bedside commode on top) Bathroom Accessibility: Yes How Accessible: Accessible via walker Home Equipment: Grill (2 wheels);BSC/3in1;Shower seat;Grab bars - tub/shower;Wheelchair - Press photographer;Other (comment)   Additional Comments: bedside commode over top toilet to allow arm rests to push up on      Prior Functioning/Environment Prior Level of Function : Independent/Modified Independent             Mobility Comments: Only hops short household distances with RW, does not use his prosthesis as it tends to make him sore superiorly on thigh/groin region. Otherwise uses w/c or scooter. ADLs Comments: Dresses and bathes himself.        OT Problem List: Decreased strength;Decreased activity tolerance;Impaired balance (sitting and/or standing);Decreased coordination;Decreased cognition;Decreased safety awareness;Decreased knowledge of use of DME or AE;Decreased knowledge of precautions;Cardiopulmonary status limiting activity      OT Treatment/Interventions: Self-care/ADL training;Therapeutic  exercise;Energy conservation;DME and/or AE instruction;Therapeutic activities;Cognitive remediation/compensation;Patient/family education;Balance training    OT Goals(Current goals can be found in the care plan section) Acute  Rehab OT Goals Patient Stated Goal: to get out of here OT Goal Formulation: Patient unable to participate in goal setting Time For Goal Achievement: 09/09/21 Potential to Achieve Goals: Fair  OT Frequency: Min 2X/week    Co-evaluation PT/OT/SLP Co-Evaluation/Treatment: Yes Reason for Co-Treatment: Necessary to address cognition/behavior during functional activity;For patient/therapist safety;To address functional/ADL transfers PT goals addressed during session: Mobility/safety with mobility;Balance OT goals addressed during session: Proper use of Adaptive equipment and DME;ADL's and self-care;Strengthening/ROM      AM-PAC OT "6 Clicks" Daily Activity     Outcome Measure Help from another person eating meals?: A Little Help from another person taking care of personal grooming?: A Little Help from another person toileting, which includes using toliet, bedpan, or urinal?: A Lot Help from another person bathing (including washing, rinsing, drying)?: A Lot Help from another person to put on and taking off regular upper body clothing?: A Little Help from another person to put on and taking off regular lower body clothing?: A Lot 6 Click Score: 15   End of Session Equipment Utilized During Treatment: Rolling walker (2 wheels) Nurse Communication: Mobility status;Other (comment) (Impulsive and agitated)  Activity Tolerance: Patient tolerated treatment well;Other (comment) (Minimally agitated with participation) Patient left: in chair;with call bell/phone within reach;with chair alarm set  OT Visit Diagnosis: Unsteadiness on feet (R26.81);Other abnormalities of gait and mobility (R26.89);Muscle weakness (generalized) (M62.81);Other symptoms and signs involving cognitive function                Time: 2979-8921 OT Time Calculation (min): 19 min Charges:  OT General Charges $OT Visit: 1 Visit OT Evaluation $OT Eval Moderate Complexity: 1 Mod  Corinne Ports E. Khaleah Duer, OTR/L Acute Rehabilitation  Services [phone] [place] 08/26/2021, 12:37 PM
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
## 2021-08-26 NOTE — NC FL2 (Signed)
[place] LEVEL OF CARE SCREENING TOOL     IDENTIFICATION  Patient Name: Troy [name] Birthdate: 1960/09/01 Sex: male Admission Date (Current Location): 08/15/2021  [facility] and [place] Number:  Herbalist and Address:  [facility] [place]. [facility], [place] [address] [place], [place] [address]      Provider Number: [id number]  Attending Physician Name and Address:  Little Ishikawa, MD  Relative Name and Phone Number:  Marcie Bal [phone]    Current Level of Care: Hospital Recommended Level of Care: [place] Prior Approval Number:    Date Approved/Denied:   PASRR Number: [id number]  Discharge Plan: SNF    Current Diagnoses: Patient Active Problem List   Diagnosis Date Noted   Lower limb ischemia: ??? 08/22/2021   Diabetic acidosis without coma (HCC)    Hypokalemia    Hypomagnesemia    Hyponatremia    Pneumonia due to infectious organism    Parapneumonic effusion 08/18/2021   Bowel Ileus ([place]) 08/18/2021   ?? NASH Liver Cirrhosis ([place]) 08/18/2021   Ketoacidosis due to type 2 diabetes mellitus ([place]) 08/15/2021   COVID-19 virus infection 08/15/2021   S/P AKA (above knee amputation), left ([place]) 02/12/2021   Amputation of right great toe and 2nd Toe 02/12/2021   Sepsis due to left-sided neck cellulitis/MRSA 02/12/2021   Hypotensive episode    Elevated troponin    SIRS (systemic inflammatory response syndrome) ([place]) 02/11/2021   Abscess of bursa of left ankle 01/21/2021   Dehiscence of amputation stump ([place])    Ischemia of left BKA site ([place])    Abscess of leg without foot, left    Osteomyelitis of second toe of right foot ([place]) 07/12/2019   Below knee amputation status, left 01/25/2018   Wound dehiscence, surgical    Status post left foot surgery 12/15/2017   GERD (gastroesophageal reflux disease) 09/26/2017   Hyperlipidemia associated with type 2 diabetes mellitus ([place]) 07/08/2017   Snoring 06/07/2016   Chest  tube in place 01/22/2016   Abnormal nuclear stress test 01/22/2016   Pain, chronic due to trauma 07/04/2012   Complex regional pain syndrome I of lower limb 07/04/2012   COPD (chronic obstructive pulmonary disease) ([place]) 01/27/2011   Pre-operative cardiovascular examination 01/27/2011   Nonspecific abnormal electrocardiogram (ECG) (EKG) 01/27/2011   Murmur 01/27/2011   DM2 (diabetes mellitus, type 2) (HCC)    HTN (hypertension)    HLD (hyperlipidemia)    Tobacco abuse     Orientation RESPIRATION BLADDER Height & Weight     Self, Time, Situation, Place (WDL)  Normal Continent, External catheter (External Urinary Catheter) Weight: 181 lb 14.1 oz (82.5 kg) Height:  '5\' 10"'$  (177.8 cm)  BEHAVIORAL SYMPTOMS/MOOD NEUROLOGICAL BOWEL NUTRITION STATUS      Incontinent Diet (Please see discharge summary)  AMBULATORY STATUS COMMUNICATION OF NEEDS Skin   Limited Assist Verbally Other (Comment) (appropriate for ethnicity,dry,abrasion,back,left lower)                       Personal Care Assistance Level of Assistance  Bathing, Feeding, Dressing Bathing Assistance: Limited assistance Feeding assistance: Independent (able to feed self) Dressing Assistance: Limited assistance     Functional Limitations Info  Sight, Hearing, Speech Sight Info: Impaired Hearing Info: Adequate Speech Info: Adequate    SPECIAL CARE FACTORS FREQUENCY  PT (By licensed PT), OT (By licensed OT)     PT Frequency: 5x min weekly OT Frequency: 5x min weekly  Contractures Contractures Info: Not present    Additional Factors Info  Code Status, Allergies, Psychotropic, Insulin Sliding Scale, Isolation Precautions Code Status Info: FULL Allergies Info: Hydromorphone Hcl Er,Tapentadol,Exalamide Psychotropic Info: amitriptyline (ELAVIL) tablet 50 mg daily at bedtime Insulin Sliding Scale Info: insulin aspart (novoLOG) injection 0-20 Units 3 times daily with meals,insulin aspart (novoLOG) injection  0-5 Units daily at bedtime,insulin aspart (novoLOG) injection 8 Units 3 times daily with meals,insulin glargine-yfgn (SEMGLEE) injection 20 Units 2 times daily Isolation Precautions Info: Covid + onset date 2/26     Current Medications (08/26/2021):  This is the current hospital active medication list Current Facility-Administered Medications  Medication Dose Route Frequency Provider Last Rate Last Admin   0.9 %  sodium chloride infusion   Intravenous PRN Kathie Dike, MD   Paused at 08/22/21 2318   amitriptyline (ELAVIL) tablet 50 mg  50 mg Oral QHS Emokpae, Courage, MD   50 mg at 08/25/21 2123   Ampicillin-Sulbactam (UNASYN) 3 g in sodium chloride 0.9 % 100 mL IVPB  3 g Intravenous Q6H Rosiland Oz, MD 200 mL/hr at 08/26/21 0827 3 g at 08/26/21 0827   aspirin EC tablet 81 mg  81 mg Oral Q breakfast Truett Mainland, DO   81 mg at 08/26/21 0938   benzonatate (TESSALON) capsule 200 mg  200 mg Oral TID PRN Eugenie Filler, MD   200 mg at 08/24/21 1829   Chlorhexidine Gluconate Cloth 2 % PADS 6 each  6 each Topical Daily Emokpae, Courage, MD   6 each at 08/26/21 1141   dextrose 50 % solution 0-50 mL  0-50 mL Intravenous PRN Truett Mainland, DO       doxycycline (VIBRA-TABS) tablet 100 mg  100 mg Oral Q12H Rosiland Oz, MD   100 mg at 08/26/21 0815   gabapentin (NEURONTIN) capsule 300 mg  300 mg Oral QID Truett Mainland, DO   300 mg at 08/26/21 0815   guaiFENesin (MUCINEX) 12 hr tablet 1,200 mg  1,200 mg Oral BID Eugenie Filler, MD   1,200 mg at 08/26/21 0815   guaiFENesin-dextromethorphan (ROBITUSSIN DM) 100-10 MG/5ML syrup 5 mL  5 mL Oral Q4H PRN Adefeso, Oladapo, DO   5 mL at 08/23/21 0550   heparin ADULT infusion 100 units/mL (25000 units/2110m)  2,300 Units/hr Intravenous Continuous BLaren Everts RPH 23 mL/hr at 08/26/21 0715 2,300 Units/hr at 08/26/21 0715   insulin aspart (novoLOG) injection 0-20 Units  0-20 Units Subcutaneous TID WC Emokpae, Courage, MD   3 Units at  08/26/21 1244   insulin aspart (novoLOG) injection 0-5 Units  0-5 Units Subcutaneous QHS ERoxan Hockey MD   2 Units at 08/25/21 2124   insulin aspart (novoLOG) injection 8 Units  8 Units Subcutaneous TID WC TEugenie Filler MD   8 Units at 08/26/21 1244   insulin glargine-yfgn (SEMGLEE) injection 20 Units  20 Units Subcutaneous BID TEugenie Filler MD   20 Units at 08/26/21 0820   ipratropium (ATROVENT) nebulizer solution 0.5 mg  0.5 mg Nebulization Q2H PRN TEugenie Filler MD       ipratropium (ATROVENT) nebulizer solution 0.5 mg  0.5 mg Nebulization BID TEugenie Filler MD   0.5 mg at 08/26/21 0859   levalbuterol (XOPENEX) nebulizer solution 0.63 mg  0.63 mg Nebulization Q2H PRN TEugenie Filler MD       levalbuterol ([place]ash nebulizer solution 0.63 mg  0.63 mg Nebulization BID TEugenie Filler MD   0.63 mg at 08/26/21  0859   living well with diabetes book MISC   Does not apply Once Emokpae, Courage, MD       loperamide (IMODIUM) capsule 4 mg  4 mg Oral PRN Truett Mainland, DO       mometasone-formoterol (DULERA) 200-5 MCG/ACT inhaler 2 puff  2 puff Inhalation BID Eugenie Filler, MD   2 puff at 08/26/21 0859   multivitamin with minerals tablet 1 tablet  1 tablet Oral Daily Eugenie Filler, MD   1 tablet at 08/26/21 1140   nitroGLYCERIN (NITROSTAT) SL tablet 0.4 mg  0.4 mg Sublingual Q5 min PRN Roxan Hockey, MD   0.4 mg at 08/16/21 1630   ondansetron (ZOFRAN) tablet 4 mg  4 mg Oral Q6H PRN Truett Mainland, DO   4 mg at 08/16/21 0125   Or   ondansetron (ZOFRAN) injection 4 mg  4 mg Intravenous Q6H PRN Truett Mainland, DO   4 mg at 08/16/21 1154   oxyCODONE-acetaminophen (PERCOCET/ROXICET) 5-325 MG per tablet 1 tablet  1 tablet Oral Q4H PRN Truett Mainland, DO   1 tablet at 08/26/21 1140   And   oxyCODONE (Oxy IR/ROXICODONE) immediate release tablet 5 mg  5 mg Oral Q4H PRN Truett Mainland, DO   5 mg at 08/26/21 1140   pantoprazole (PROTONIX) EC tablet 40 mg   40 mg Oral Daily Truett Mainland, DO   40 mg at 08/26/21 0815   polyethylene glycol (MIRALAX / GLYCOLAX) packet 17 g  17 g Oral Daily PRN Eugenie Filler, MD       protein supplement (ENSURE MAX) liquid  11 oz Oral BID Eugenie Filler, MD       senna-docusate (Senokot-S) tablet 1 tablet  1 tablet Oral BID Eugenie Filler, MD   1 tablet at 08/26/21 8676     Discharge Medications: Please see discharge summary for a list of discharge medications.  Relevant Imaging Results:  Relevant Lab Results:   Additional Information [id number], Not Covid Vaccinated  [place] Gain, LCSWA

## 2021-08-26 NOTE — Progress Notes (Signed)
?  Daily Progress Note ? ?Subjective: ?Doing well, no complaints, denies sensorimotor changes in the foot ? ?Objective: ?Vitals:  ? 08/26/21 0411 08/26/21 0859  ?BP: 124/65   ?Pulse: (!) 101   ?Resp: (!) 22   ?Temp: 98.1 ?F (36.7 ?C)   ?SpO2: 95% 96%  ?  ?Physical Examination ?Palpable pulse in the foot ?Nonlabored breathing ?Regular rate ? ?ASSESSMENT/PLAN:  ?This is a 61 y.o. male with asymptomatic mottling of the right forefoot.  Excellent signals in the foot in both dorsalis pedis and posterior tibial arteries.  ABIs were reviewed demonstrating normal waveforms bilaterally, normal ABI.  Mildly confusion present for some time.  Likely small vessel disease due to the patient's comorbidities.  Asymptomatic.  Did not need to continue anticoagulation at this time. ?Continue aspirin. ? ?Troy [name] was asked to call our office should any ulcerations arise on the foot or wrist pain occur. ? ? ?J. Melene Muller MD MS ?[facility] ?[phone] ?08/26/2021  ?4:13 PM ? ?

## 2021-08-26 NOTE — Progress Notes (Signed)
[facility] ? ? ?Patient name: Troy [name] Admit date: 08/15/2021  ?DOB: [birth year] LOS: 11  ?MRN: [id number] Consult date: 08/19/2021  ?Referring provider: Dr. Roderic Palau, [facility] CC: Pleural effusion  ? ? ?History:  ?61 yo male smoker presented to [facility] ER with weakness, fatigue, nausea, vomiting and diarrhea.  Found to be positive for COVID 19 infection.  Also found to have DKA and pneumonia.  Chest xray showed loculated left pleural effusion with left base consolidation/atelectasis.  IR consulted to assess for left thoracentesis but was unsuccessful.  PCCM consulted to assist with assessment of pleural effusion. ? ?Past medical history:  ?DM type 2, PAD s/p Lt AKA, Diastolic CHF, GERD, HTN, HLD, Anxiety, Depression, Fatty liver, Nephrolithiasis, PTSD, Insomnia, Neuropathy ? ?Significant events:  ?2/25 admit ?2/28 IR attempted Lt thoracentesis but unsuccessful ?3/2 chest tube placement ?3/3 first dose of intrapleural lytics; 360cc pleural fluid in atrium pre-lytics ?3/4 second dose tpa/dornase  ?3/6 CT chest shows persistent L pleural effusion with likely interval hemorrhage ?3/8 chest tube removed  ? ?Studies:  ?PFT 06/07/16 >> FEV1 2.52 (63%), FEV1% 84, TLC 5.26 (71%), DLCO 73% ?A1AT 06/07/16 >> 154, MS ?Echo 02/12/21 >> EF 70 to 75%, mod LVH, grade 1 DD, mild LA dilation ?CT angio chest 08/18/21 >> stable ventricular aneurysm, multiloculated moderate Lt pleural effusion with LLL ASD, changes of cirrhosis ?CT chest 3/6>Loculated left pleural effusion overall appears slightly smaller in size after chest tube placement, but now demonstrates some mixed intermediate density, consistent with interval hemorrhage. 2. Increasing multiple irregular foci of air within the medial left lower lobe, concerning for necrotizing pneumonia.  New peripheral predominant asymmetric ground-glass densities in both upper lobes and the superior segment of the right lower lobe, consistent  with COVID pneumonia. ? ? ?Interim history:  ? ?No sig output from chest tube  ? ?Physical Exam:  ? ?General this is a 61 year old male he is resting in bed Not in distress ?HENT NCAT no JVD  ?Pulm more diminished on left. No accessory use  ?Card rrr ?Abd soft  ?Ext warm  ?Neuro intact  ? ? ?Assessment/plan:  ? ?Necrotizing PNA w/ Loculated left sided pleural effusion, with PNA and COVID-19  ?CXR a little better ?Really minimal output from Chest tube complicated by pleural hemorrhage after last TPA. He's had minimal output.Marland Kitchenlooks like mostly clot now. Seen by thoracic not candidate for VATS.  ?-I removed the chest tube.  ?Plan ?Cont IV abx as directed by ID ?Repeat CXR in am  ?Will follow up office  ? ? ?COPD with MS alpha 1 genotype ?Plan ?Cont BDs ? ?Roseomonas bacteremia  ?Plan ?Per ID  ? ?R foot cyanosis ?PAD ?-stable clinically without ischemic symptoms ?Plan ?Monitor  ? ?Will arrage f/u  ? ?Erick Colace ACNP-BC ?[place] ?Pager # [phone] OR # [phone] if no answer ? ? ? ? ? ? ?

## 2021-08-26 NOTE — Progress Notes (Signed)
Paged Dr. Kary Kos and paged Dr. Avon Gully. Chest tube dressing saturated with serosanguinous fluid and blood. Chest xray order by Dr. Avon Gully, he personally stopped heparin gtt at bedside. Dressing of chest tube site changed. Pt placed in upright position. Advised to continue to monitor.  ?

## 2021-08-26 NOTE — Progress Notes (Signed)
Inpatient Diabetes Program Recommendations ? ?AACE/ADA: New Consensus Statement on Inpatient Glycemic Control  ? ?Target Ranges:  Prepandial:   less than 140 mg/dL ?     Peak postprandial:   less than 180 mg/dL (1-2 hours) ?     Critically ill patients:  140 - 180 mg/dL  ? ? Latest Reference Range & Units 08/26/21 07:11  ?Glucose-Capillary 70 - 99 mg/dL 239 (H)  ? ? Latest Reference Range & Units 08/25/21 07:15 08/25/21 12:26 08/25/21 16:09 08/25/21 21:22  ?Glucose-Capillary 70 - 99 mg/dL 128 (H) 214 (H) 179 (H) 244 (H)  ? ?Review of Glycemic Control ? ? ?Current orders for Inpatient glycemic control: Semglee 20 units BID, Novolog 8 units TID with meals, Novolog 0-20 units TID with meals, Novolog 0-5 units QHS ? ?Inpatient Diabetes Program Recommendations:   ? ?Insulin: Please consider increasing meal coverage to Novolog 11 units TID with meals. ? ?Thanks, ?Barnie Alderman, RN, MSN, CDE ?Diabetes Coordinator ?Inpatient Diabetes Program ?[phone] (Team Pager from 8am to 5pm) ? ? ? ?

## 2021-08-26 NOTE — Progress Notes (Signed)
ANTICOAGULATION CONSULT NOTE - Follow Up Consult ? ?Pharmacy Consult for Heparin ?Indication:  empiric arterial thrombus ? ?Allergies  ?Allergen Reactions  ? Hydromorphone Hcl Er Anaphylaxis and Other (See Comments)  ?  Allergic to DYE in extended-release tablet, can tolerate other forms of hydromorphone  ? Tapentadol Anaphylaxis, Swelling and Other (See Comments)  ?  THROAT ?ANGIOEDEMA ?Nucynta [Tapentadol Hydrochloride]  ? Exalamide Other (See Comments)  ?  UNSPECIFIED REACTION   ? ? ?Patient Measurements: ?Height: '5\' 10"'$  (177.8 cm) ?Weight: 82.5 kg (181 lb 14.1 oz) ?IBW/kg (Calculated) : 73 ?Heparin Dosing Weight: 79.7 kg ? ?Vital Signs: ?Temp: 98.1 ?F (36.7 ?C) (03/08 0411) ?Temp Source: Oral (03/08 0411) ?BP: 124/65 (03/08 0411) ?Pulse Rate: 101 (03/08 0411) ? ?Labs: ?Recent Labs  ?  08/24/21 ?0158 08/24/21 ?1010 08/25/21 ?0232 08/26/21 ?0209  ?HGB 10.5*  --  10.6* 9.8*  ?HCT 31.1*  --  31.6* 28.8*  ?PLT 302  --  344 323  ?HEPARINUNFRC 0.23* 0.48 0.31 0.38  ?CREATININE 0.49*  --  0.56* 0.65  ? ? ? ?Estimated Creatinine Clearance: 100.1 mL/min (by C-G formula based on SCr of 0.65 mg/dL). ? ? ?Medications:  ?Scheduled:  ? amitriptyline  50 mg Oral QHS  ? aspirin EC  81 mg Oral Q breakfast  ? Chlorhexidine Gluconate Cloth  6 each Topical Daily  ? doxycycline  100 mg Oral Q12H  ? gabapentin  300 mg Oral QID  ? guaiFENesin  1,200 mg Oral BID  ? insulin aspart  0-20 Units Subcutaneous TID WC  ? insulin aspart  0-5 Units Subcutaneous QHS  ? insulin aspart  8 Units Subcutaneous TID WC  ? insulin glargine-yfgn  20 Units Subcutaneous BID  ? ipratropium  0.5 mg Nebulization BID  ? levalbuterol  0.63 mg Nebulization BID  ? living well with diabetes book   Does not apply Once  ? mometasone-formoterol  2 puff Inhalation BID  ? multivitamin with minerals  1 tablet Oral Daily  ? pantoprazole  40 mg Oral Daily  ? Ensure Max Protein  11 oz Oral BID  ? senna-docusate  1 tablet Oral BID  ? ?Infusions:  ? sodium chloride Stopped  (08/22/21 2318)  ? ampicillin-sulbactam (UNASYN) IV 3 g (08/26/21 0827)  ? heparin 2,300 Units/hr (08/26/21 0715)  ? ? ?Assessment: ?61 yo M with COVID+ and risk for arterial thrombus per [facility].  Pharmacy consulted for heparin dosing.  ? ?Heparin level 0.38 at goal on heparin drip 2300 uts/hr  Serosanguinous drainage from chest tube per MD notes.   ? ?Goal of Therapy:  ?Heparin level 0.3-0.7 units/ml ?Monitor platelets by anticoagulation protocol: Yes ?  ?Plan:  ?Continue  heparin gtt at 2300 units/hr. ?Daily CBC, heparin level. ?Monitor for signs/symptoms of bleeding. ? ?Hildred Laser, PharmD ?Clinical Pharmacist ?**Pharmacist phone directory can now be found on [email or web address] ([email or web address]).  Listed under [place]. ? ? ? ? ? ?

## 2021-08-26 NOTE — Progress Notes (Signed)
Chest tube removed this AM. Dressing is clear, dry and intact.  ?
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
## 2021-08-26 NOTE — TOC Initial Note (Addendum)
Transition of Care (TOC) - Initial/Assessment Note  ? ? ?Patient Details  ?Name: Troy [name] ?MRN: [id number] ?Date of Birth: Sep 06, 1960 ? ?Transition of Care (TOC) CM/SW Contact:    ?Milas Gain, LCSWA ?Phone Number: ?08/26/2021, 12:03 PM ? ?Clinical Narrative:                 ? ?CSW received consult for possible SNF placement at time of discharge. CSW spoke with patients spouse regarding PT recommendation of SNF placement at time of discharge. PTA patient was from home with spouse.Patients spouse expressed understanding of PT recommendation and is agreeable to SNF placement for patient at time of discharge. Patients spouse reports preference for [facility] . Patient spouse also gave CSW permission to fax out initial referral near [place]. CSW discussed insurance authorization process with patients spouse. Patient has not received the COVID vaccines. No further questions reported at this time. CSW to continue to follow and assist with discharge planning needs.  ? ?Update- CSW called Horris Latino with [facility] and Rehabilitation center. Bonnie request for CSW to fax over initial referral for review. CSW faxed over referral for review. Current pending SNF bed offers. CSW will start insurance authorization close to patient being medically ready for dc. ? ?Update-3:19pm CSW received call from [facility] with [facility] who confirmed they can offer SNF bed for patient. CSW informed patients spouse who accepted SNF bed offer. CSW following to start insurance authorization close to patient being medically ready for dc. ? ?Expected Discharge Plan: [place] ?Barriers to Discharge: Continued Medical Work up ? ? ?Patient Goals and CMS Choice ?  ?CMS Medicare.gov Compare Post Acute Care list provided to:: Patient Represenative (must comment) (patients spouse Marcie Bal) ?Choice offered to / list presented to : Spouse (Patients spouse  Marcie Bal) ? ?Expected Discharge Plan and Services ?Expected Discharge Plan: [place] ?In-house Referral: Clinical Social Work ?Discharge Planning Services: CM Consult ?Post Acute Care Choice: Home Health ?Living arrangements for the past 2 months: [place] ?                ?  ?DME Agency: NA ?  ?  ?  ?  ?  ?  ?  ?  ? ?Prior Living Arrangements/Services ?Living arrangements for the past 2 months: [place] ?Lives with:: Self, Spouse ?Patient language and need for interpreter reviewed:: Yes ?Do you feel safe going back to the place where you live?: No   SNF  ?Need for Family Participation in Patient Care: Yes (Comment) ?Care giver support system in place?: Yes (comment) ?  ?Criminal Activity/Legal Involvement Pertinent to Current Situation/Hospitalization: No - Comment as needed ? ?Activities of Daily Living ?Home Assistive Devices/Equipment: Prosthesis, Wheelchair, Eyeglasses ?ADL Screening (condition at time of admission) ?Patient's cognitive ability adequate to safely complete daily activities?: Yes ?Is the patient deaf or have difficulty hearing?: No ?Does the patient have difficulty seeing, even when wearing glasses/contacts?: No ?Does the patient have difficulty concentrating, remembering, or making decisions?: No ?Patient able to express need for assistance with ADLs?: Yes ?Does the patient have difficulty dressing or bathing?: No ?Independently performs ADLs?: Yes (appropriate for developmental age) ?Does the patient have difficulty walking or climbing stairs?: No ?Weakness of Legs: None ?Weakness of Arms/Hands: None ? ?Permission Sought/Granted ?Permission sought to share information with : Case Manager, Family Supports, Customer service manager ?  ? Share Information with NAME: Due to orientation spoke with  patients spouse Marcie Bal ? Permission granted to share info w AGENCY: Due to orientation spoke with patients spouse Janet/SNF ? Permission granted to share info w  Relationship: Due to orientation spoke with patients spouse Marcie Bal ? Permission granted to share info w Contact Information: Due to orientation spoke with patients spouse Marcie Bal [id number] ? ?Emotional Assessment ?Appearance:: Appears stated age ?Attitude/Demeanor/Rapport: Engaged ?Affect (typically observed): Appropriate ?Orientation: : Oriented to Self, Oriented to Place, Oriented to  Time, Oriented to Situation (WDL) ?Alcohol / Substance Use: Not Applicable ?Psych Involvement: No (comment) ? ?Admission diagnosis:  Ketoacidosis due to type 2 diabetes mellitus ([place]) [E11.10] ?Patient Active Problem List  ? Diagnosis Date Noted  ? Lower limb ischemia: ??? 08/22/2021  ? Diabetic acidosis without coma ([place])   ? Hypokalemia   ? Hypomagnesemia   ? Hyponatremia   ? Pneumonia due to infectious organism   ? Parapneumonic effusion 08/18/2021  ? Bowel Ileus ([place]) 08/18/2021  ? ?? NASH Liver Cirrhosis ([place]) 08/18/2021  ? Ketoacidosis due to type 2 diabetes mellitus ([place]) 08/15/2021  ? COVID-19 virus infection 08/15/2021  ? S/P AKA (above knee amputation), left ([place]) 02/12/2021  ? Amputation of right great toe and 2nd Toe 02/12/2021  ? Sepsis due to left-sided neck cellulitis/MRSA 02/12/2021  ? Hypotensive episode   ? Elevated troponin   ? SIRS (systemic inflammatory response syndrome) ([place]) 02/11/2021  ? Abscess of bursa of left ankle 01/21/2021  ? Dehiscence of amputation stump (HCC)   ? Ischemia of left BKA site [facility])   ? Abscess of leg without foot, left   ? Osteomyelitis of second toe of right foot ([place]) 07/12/2019  ? Below knee amputation status, left 01/25/2018  ? Wound dehiscence, surgical   ? Status post left foot surgery 12/15/2017  ? GERD (gastroesophageal reflux disease) 09/26/2017  ? Hyperlipidemia associated with type 2 diabetes mellitus ([place]) 07/08/2017  ? Snoring 06/07/2016  ? Chest tube in place 01/22/2016  ? Abnormal nuclear stress test 01/22/2016  ? 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 Complex regional  pain syndrome I of lower limb 07/04/2012  ? COPD (chronic obstructive pulmonary disease) ([place]) 01/27/2011  ? Pre-operative cardiovascular examination 01/27/2011  ? Nonspecific abnormal electrocardiogram (ECG) (EKG) 01/27/2011  ? Murmur 01/27/2011  ? DM2 (diabetes mellitus, type 2) ([place])   ? HTN (hypertension)   ? HLD (hyperlipidemia)   ? Tobacco abuse   ? ?PCP:  Pcp, No ?Pharmacy:   ?[id number] [place], [place] - [id number]?Phone: [phone]: [phone]? ? ? ? ?Social Determinants of Health (SDOH) Interventions ?  ? ?Readmission Risk Interventions ?Readmission Risk Prevention Plan 08/25/2021  ?Transportation Screening Complete  ?Medication Review (Press photographer Complete  ?PCP or Specialist appointment within 3-5 days of discharge Complete  ?[facility] Consult Complete  ?SW Recovery Care/Counseling Consult Complete  ?Palliative Care Screening Not Applicable  ?[place] Applic[place]  ?Some recent data might be hidden  ? ? ? ?

## 2021-08-26 NOTE — Telephone Encounter (Signed)
Spoke with pt's wife to schedule appt.  She will call back on 3/9 to get him scheduled for 5-6 weeks for [place].  ta ?

## 2021-08-26 NOTE — Progress Notes (Signed)
Verbally advised that it is okay for pt to come off covid precautions per Dr. Avon Gully.  ?

## 2021-08-27 LAB — GLUCOSE, CAPILLARY
Glucose-Capillary: 248 mg/dL — ABNORMAL HIGH (ref 70–99)
Glucose-Capillary: 180 mg/dL — ABNORMAL HIGH (ref 70–99)
Glucose-Capillary: 109 mg/dL — ABNORMAL HIGH (ref 70–99)

## 2021-08-27 LAB — CBC
HCT: 25.7 % — ABNORMAL LOW (ref 39.0–52.0)
Hemoglobin: 8.8 g/dL — ABNORMAL LOW (ref 13.0–17.0)
MCH: 31.2 pg (ref 26.0–34.0)
MCHC: 34.2 g/dL (ref 30.0–36.0)
MCV: 91.1 fL (ref 80.0–100.0)
Platelets: 330 10*3/uL (ref 150–400)
RBC: 2.82 MIL/uL — ABNORMAL LOW (ref 4.22–5.81)
RDW: 14 % (ref 11.5–15.5)
WBC: 16.8 10*3/uL — ABNORMAL HIGH (ref 4.0–10.5)
nRBC: 0 % (ref 0.0–0.2)

## 2021-08-27 LAB — BASIC METABOLIC PANEL
Anion gap: 9 (ref 5–15)
BUN: 8 mg/dL (ref 8–23)
CO2: 26 mmol/L (ref 22–32)
Calcium: 8.1 mg/dL — ABNORMAL LOW (ref 8.9–10.3)
Chloride: 98 mmol/L (ref 98–111)
Creatinine, Ser: 0.56 mg/dL — ABNORMAL LOW (ref 0.61–1.24)
GFR, Estimated: >60 mL/min (ref >60)
Glucose, Bld: 208 mg/dL — ABNORMAL HIGH (ref 70–99)
Potassium: 4.1 mmol/L (ref 3.5–5.1)
Sodium: 133 mmol/L — ABNORMAL LOW (ref 135–145)

## 2021-08-27 MED ORDER — ONDANSETRON HCL 4 MG PO TABS: 4.0000 mg | ORAL_TABLET | Freq: Four times a day (QID) | ORAL | 0 refills | Status: DC | PRN

## 2021-08-27 MED ORDER — AMOXICILLIN-POT CLAVULANATE 875-125 MG PO TABS: 1.0000 | ORAL_TABLET | Freq: Two times a day (BID) | ORAL | 0 refills | Status: AC

## 2021-08-27 MED ORDER — PANTOPRAZOLE SODIUM 40 MG PO TBEC: 40.0000 mg | DELAYED_RELEASE_TABLET | Freq: Every day | ORAL | 0 refills | Status: DC

## 2021-08-27 MED ORDER — LEVALBUTEROL HCL 0.63 MG/3ML IN NEBU: 0.6300 mg | INHALATION_SOLUTION | Freq: Two times a day (BID) | RESPIRATORY_TRACT | 12 refills | Status: DC

## 2021-08-27 MED ORDER — POLYETHYLENE GLYCOL 3350 17 G PO PACK: 17.0000 g | PACK | Freq: Every day | ORAL | 0 refills | Status: DC | PRN

## 2021-08-27 MED ORDER — ASPIRIN 81 MG PO TBEC: 81.0000 mg | DELAYED_RELEASE_TABLET | Freq: Every day | ORAL | 11 refills | Status: DC

## 2021-08-27 MED ORDER — AMITRIPTYLINE HCL 50 MG PO TABS: 50.0000 mg | ORAL_TABLET | Freq: Every day | ORAL | 0 refills | Status: DC

## 2021-08-27 MED ORDER — LEVALBUTEROL HCL 0.63 MG/3ML IN NEBU: 0.6300 mg | INHALATION_SOLUTION | RESPIRATORY_TRACT | 12 refills | Status: DC | PRN

## 2021-08-27 MED ORDER — DOXYCYCLINE HYCLATE 100 MG PO TABS: 100.0000 mg | ORAL_TABLET | Freq: Two times a day (BID) | ORAL | 0 refills | Status: AC

## 2021-08-27 MED ORDER — MOMETASONE FURO-FORMOTEROL FUM 200-5 MCG/ACT IN AERO
2.0000 | INHALATION_SPRAY | Freq: Two times a day (BID) | RESPIRATORY_TRACT | 0 refills | Status: DC
Start: 2021-08-27 — End: 2023-01-26

## 2021-08-27 NOTE — Discharge Summary (Signed)
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 [place] [address], [place], [place] [address]    Report Status 08/25/2021 FINAL  Final  Culture, blood (Routine X  2) w Reflex to ID Panel     Status: None (Preliminary result)   Collection Time: 08/24/21 10:10 AM   Specimen: BLOOD  Result Value Ref Range Status   Specimen Description BLOOD LEFT ANTECUBITAL  Final   Special Requests   Final    BOTTLES DRAWN AEROBIC AND ANAEROBIC Blood Culture adequate volume   Culture   Final    NO GROWTH 3 DAYS Performed at [facility], [place] [address], [place], [place] [address]    Report Status PENDING  Incomplete  Culture, blood (Routine X 2) w Reflex to ID Panel     Status: None (Preliminary result)   Collection Time: 08/24/21 10:15 AM   Specimen: BLOOD  Result Value Ref Range Status   Specimen Description BLOOD THUMB  Final   Special Requests   Final    BOTTLES DRAWN AEROBIC AND ANAEROBIC Blood Culture adequate volume   Culture   Final    NO GROWTH 3 DAYS Performed at [facility], [address] [place], [place] [address]    Report Status PENDING  Incomplete     Time coordinating discharge: Over 30 minutes  SIGNED:   Little Ishikawa, DO [facility] 08/27/2021, 1:15 PM Pager   If 7PM-7AM, please contact night-coverage [email or web address]

## 2021-08-27 NOTE — TOC Progression Note (Addendum)
Transition of Care (TOC) - Progression Note  ? ? ?Patient Details  ?Name: Troy [name] ?MRN: [id number] ?Date of Birth: [birth year] ? ?Transition of Care (TOC) CM/SW Contact  ?Milas Gain, LCSWA ?Phone Number: ?08/27/2021, 11:05 AM ? ?Clinical Narrative:    ? ?Update-CSW received callback from [facility] with [facility] and [place] who confirmed patient can dc over today if medically ready. CSW informed MD. ? ?CSW started insurnace authorization for patient and received approval.Plan auth ID # [id number] [place] ID # [id number]. Insurance authorization approval from 3/9-3/13. Patient has SNF bed at [facility]. CSW awaiting callback from Lorenzo with [facility] to confirm is patient is okay to dc over today if medically cleared from md. ? ? ?Expected Discharge Plan: [place] ?Barriers to Discharge: Continued Medical Work up ? ?Expected Discharge Plan and Services ?Expected Discharge Plan: [place] ?In-house Referral: Clinical Social Work ?Discharge Planning Services: CM Consult ?Post Acute Care Choice: Home Health ?Living arrangements for the past 2 months: [place] ?                ?  ?DME Agency: NA ?  ?  ?  ?  ?  ?  ?  ?  ? ? ?Social Determinants of Health (SDOH) Interventions ?  ? ?Readmission Risk Interventions ?Readmission Risk Prevention Plan 08/25/2021  ?Transportation Screening Complete  ?Medication Review Press photographer) Complete  ?PCP or Specialist appointment within 3-5 days of discharge Complete  ?[place] or Home Care Consult Complete  ?SW Recovery Care/Counseling Consult Complete  ?Palliative Care Screening Not Applicable  ?Skilled Nursing Facility Complete  ?Some recent data might be hidden  ? ? ?

## 2021-08-27 NOTE — Plan of Care (Signed)

## 2021-08-27 NOTE — TOC Transition Note (Signed)
Transition of Care (TOC) - CM/SW Discharge Note ? ? ?Patient Details  ?Name: Troy [name] ?MRN: [id number] ?Date of Birth: 1961/01/11 ? ?Transition of Care (TOC) CM/SW Contact:  ?Milas Gain, LCSWA ?Phone Number: ?08/27/2021, 1:53 PM ? ? ?Clinical Narrative:    ? ?Patient will DC to: [place]  ? ?Anticipated DC date: 08/27/2021 ? ?Family notified: Marcie Bal  ? ?Transport by: Corey Harold ? ?? ? ?Per MD patient ready for DC to [facility] and [place] . RN, patient, patient's family, and facility notified of DC. Discharge Summary sent to facility. RN given number for report tele# [phone] ask for short hall nurse RM# [phone]. DC packet on chart. Ambulance transport requested for patient. ? ?CSW signing off.  ? ?Final next level of care: [place] ?Barriers to Discharge: No Barriers Identified ? ? ?Patient Goals and CMS Choice ?  ?CMS Medicare.gov Compare Post Acute Care list provided to:: Patient Represenative (must comment) (patients spouse Marcie Bal) ?Choice offered to / list presented to : Spouse (Patients spouse Marcie Bal) ? ?Discharge Placement ?  ?           ?Patient chooses bed at: [facility] ?Patient to be transferred to facility by: [facility] ?Name of family member notified: Marcie Bal ?Patient and family notified of of transfer: 08/27/21 ? ?Discharge Plan and Services ?In-house Referral: Clinical Social Work ?Discharge Planning Services: CM Consult ?Post Acute Care Choice: Home Health          ?  ?DME Agency: NA ?  ?  ?  ?  ?  ?  ?  ?  ? ?Social Determinants of Health (SDOH) Interventions ?  ? ? ?Readmission Risk Interventions ?Readmission Risk Prevention Plan 08/25/2021  ?Transportation Screening Complete  ?Medication Review Press photographer) Complete  ?PCP or Specialist appointment within 3-5 days of discharge Complete  ?[place] or Home Care Consult Complete  ?SW Recovery Care/Counseling Consult Complete  ?Palliative Care Screening Not  Applicable  ?Skilled Nursing Facility Complete  ?Some recent data might be hidden  ? ? ? ? ? ?

## 2021-08-27 NOTE — Progress Notes (Addendum)
Inpatient Diabetes Program Recommendations ? ?AACE/ADA: New Consensus Statement on Inpatient Glycemic Control  ? ?Target Ranges:  Prepandial:   less than 140 mg/dL ?     Peak postprandial:   less than 180 mg/dL (1-2 hours) ?     Critically ill patients:  140 - 180 mg/dL  ? ? Latest Reference Range & Units 08/26/21 07:11 08/26/21 11:37 08/26/21 16:33 08/26/21 21:12 08/27/21 07:36  ?Glucose-Capillary 70 - 99 mg/dL 239 (H) ? ?Novolog 15 units ? ?Semglee 20 units 144 (H) ? ?Novolog 11 units 139 (H) ? ?Novolog 11 units 161 (H) ? ? ? ? ?Semglee 20 units 248 (H)  ? ?Review of Glycemic Control ? ?Current orders for Inpatient glycemic control: Semglee 20 units BID, Novolog 8 units TID with meals, Novolog 0-20 units TID with meals, Novolog 0-5 units QHS ?  ? ?Inpatient Diabetes Program Recommendations:   ? ?Insulin: May want to consider increasing Semglee to 22 units BID. ? ?Thanks, ?Barnie Alderman, RN, MSN, CDE ?Diabetes Coordinator ?Inpatient Diabetes Program ?[phone] (Team Pager from 8am to 5pm) ? ?

## 2021-08-28 ENCOUNTER — Telehealth: Payer: Self-pay | Admitting: Internal Medicine

## 2021-08-28 DIAGNOSIS — R0609 Other forms of dyspnea: Secondary | ICD-10-CM

## 2021-08-28 NOTE — Telephone Encounter (Signed)
Encounter was put in per Marni Griffon NP, new patient consult and Marni Griffon is recommending a CXR when pt is seen by pulmonary.  ? ?Dr. Melvyn Novas would you like to wait and see patient or do you want [name] to go ahead and order CXR for patient to have done before OV?  ? ?Please advise. Thanks!  ?

## 2021-08-28 NOTE — Telephone Encounter (Signed)
Ok to do cxr prior to ov unless he gets another one in the meantime  ?

## 2021-08-28 NOTE — Telephone Encounter (Signed)
Pt scheduled 10/08/21 with Dr. Melvyn Novas in [place]. ta ?

## 2021-08-28 NOTE — Telephone Encounter (Signed)
See other encounter.  ta ?

## 2021-08-28 NOTE — Telephone Encounter (Signed)
Pt scheduled  

## 2021-08-29 LAB — CULTURE, BLOOD (ROUTINE X 2)
Culture: NO GROWTH
Culture: NO GROWTH
Special Requests: ADEQUATE
Special Requests: ADEQUATE

## 2021-08-31 ENCOUNTER — Other Ambulatory Visit: Payer: Self-pay

## 2021-08-31 NOTE — Addendum Note (Signed)
Addended by: Fritzi Mandes D on: 08/31/2021 09:49 AM ? ? Modules accepted: Orders ? ?

## 2021-08-31 NOTE — Telephone Encounter (Signed)
Called and notified patients wife (ok per dpr) advised her to get CXR day before appt or morning of unless he gets one in the meantime. Order placed and nothing further needed.  ?

## 2021-10-08 ENCOUNTER — Inpatient Hospital Stay: Payer: Medicare HMO | Admitting: Internal Medicine

## 2021-10-08 NOTE — Progress Notes (Deleted)
Troy [name], male    DOB: 03/14/1961, 61 y.o.   MRN: [id number]   Brief patient profile:  ***  yo*** *** referred to pulmonary clinic in [place]  10/08/2021 for post hosp f/u:    Admit date: 08/15/2021 Discharge date: 08/27/2021   Admitted From: Home Disposition: SNF   Brief/Interim Summary: 61 y.o. male with medical history significant for COPD, DM, HTN, diastolic CHF history of noncompliance and prior amputations and ongoing tobacco abuse admitted on 08/16/2023 with hypokalemia in the setting of acute COVID-19 infection and DKA.  He was found to have loculated left-sided pleural effusion which was felt to be superimposed bacterial infection on underlying COVID.  He has been receiving intravenous antibiotics.  Seen by pulmonology who felt that he would benefit from pigtail catheter with pleural lytics.  He has been transferred to [facility] for further management   Loculated left pleural effusion/left-sided multifocal pneumonia/empyema - Antibiotics changed to doxycycline and Augmentin through 09/17/2021 -Pigtail catheter removed 08/26/2021, minimal bleeding, now well controlled  COVID-19 infection, resolved -Tested positive on 08/13/2021. -Status post 2 doses of IV remdesivir. -Completed full course of Paxlovid.   -Completed precautions  Atypical chest pain -EKG reviewed with no acute ischemic changes noted. -Troponin flat at intake -D-dimer noted to be elevated in the setting of COVID-19 infection. -CT angiogram chest done negative for PE.  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 -Continue aspirin. -Continue pain management.   DKA, POA, resolved; chronic diabetes type 2 insulin-dependent uncontrolled with hyperglycemia -Secondary to noncompliance A1c 10.8 -Continue Semglee 20 units twice daily, continue meal coverage NovoLog 8  units 3 times daily with meals, SSI. -Diabetes coordinator following.   COPD/ongoing tobacco abuse -Stable, not in acute exacerbation -Tobacco cessation stressed to patient. -Continue current bronchodilators. -PCCM following.  Chronic hyponatremia -Stabilizing with increased p.o. intake  Possible NASH/cirrhosis -Abdominal ultrasound from 03/2017 showed fatty liver, CT abdomen and pelvis 08/13/2021, 08/15/2021 with concerns for liver cirrhosis. -Outpatient follow-up with GI and repeat FLP as outpatient advised to patient and family per Dr. Roderic Palau.   Bowel ileus -Improved.  Hypomagnesemia/hypokalemia/hypophosphatemia -Follow repeat labs in the a.m.  Questionable limb ischemia -Patient denies overt pain -Remains cool to touch with diminished pulses, vascular following no current indication for procedure -Discontinue heparin drip   Blood cultures positive for Roseomonas -Unclear etiology -Repeat blood cultures preliminary negative -ID following, continue Augmentin and doxycycline through 09/17/2021   Discharge Diagnoses:  Principal Problem:   Parapneumonic effusion Active Problems:   S/P AKA (above knee amputation), left (HCC)   Bowel Ileus ([place])   ?? NASH Liver Cirrhosis ([place])   HTN (hypertension)   Chest tube in place   Ketoacidosis due to type 2 diabetes mellitus ([place])   COVID-19 virus infection   Diabetic acidosis without coma (HCC)   Hypokalemia   Hypomagnesemia   Hyponatremia   Pneumonia due to infectious organism   Lower limb ischemia: ???       History of Present Illness  10/08/2021  Pulmonary/ 1st office eval/ Marykathryn Carboni / [facility] / post hosp f/u for empyema  No chief complaint on file.    Dyspnea:  *** Cough: *** Sleep: *** SABA use:   Past Medical History:  Diagnosis Date   Acute renal failure ([place])    in setting of NSAID use and orthopedic surgery 2010   Anxiety  and depression    Chronic diastolic CHF (congestive heart failure) ([place])    a.  Echo 6/17: severe conc LVH, vigorous EF, EF 65-70%, no dynamic obstruction, no RWMA, Gr 1 DD, mild TR  //  b. LHC 8/17: no sig CAD, LVEDP 28   COPD (chronic obstructive pulmonary disease) (HCC)    Diabetic ulcer of left foot (HCC)    DM2 (diabetes mellitus, type 2) ([place])    Dysrhythmia    Family history of early CAD    Fatty liver    GERD (gastroesophageal reflux disease)    History of amputation of foot (HCC)    L trans-met // R toe   History of cardiac catheterization    a. [place] 2002: irregs  //  b. LHC in 8/17: no sig CAD, apical DK, hyperdynamic LV, LVEDP 28   History of kidney stones    History of nuclear stress test    a. Nuc 7/17: Overall, intermediate risk nuclear stress test secondary to small size of apical lateral defect and reduced ejection fraction.  EF 43%   HLD (hyperlipidemia)    HTN (hypertension)    Hx of BKA, left ([place]) 01/03/2018   Injuries     crushing injury to both his feet in February 2010.    Kidney calculi    Palpitations    PTSD (post-traumatic stress disorder)    Tobacco abuse     Outpatient Medications Prior to Visit  Medication Sig Dispense Refill   amitriptyline (ELAVIL) 50 MG tablet Take 1 tablet (50 mg total) by mouth at bedtime. 30 tablet 0   aspirin EC 81 MG EC tablet Take 1 tablet (81 mg total) by mouth daily with breakfast. Swallow whole. 30 tablet 11   glucose blood (ONETOUCH VERIO) test strip USE TO CHECK BLOOD SUGAR 1-2 TIME(S) DAILY.     Insulin Degludec (TRESIBA) 100 UNIT/ML SOLN Inject 24 Units into the skin in the morning and at bedtime. (Patient not taking: Reported on 08/16/2021)     Insulin Pen Needle (PEN NEEDLES) 32G X 4 MM MISC by Does not apply route.     ipratropium (ATROVENT) 0.02 % nebulizer solution Take 2.5 mLs (0.5 mg total) by nebulization 4 (four) times daily as needed for wheezing or shortness of breath. 2.5 mL 1   levalbuterol (XOPENEX) 0.63 MG/3ML nebulizer solution Take 3 mLs (0.63 mg total) by nebulization 2 (two) times  daily. 3 mL 12   levalbuterol (XOPENEX) 0.63 MG/3ML nebulizer solution Take 3 mLs (0.63 mg total) by nebulization every 2 (two) hours as needed for wheezing or shortness of breath. 3 mL 12   mometasone-formoterol (DULERA) 200-5 MCG/ACT AERO Inhale 2 puffs into the lungs 2 (two) times daily. 1 each 0   ondansetron (ZOFRAN) 4 MG tablet Take 1 tablet (4 mg total) by mouth every 6 (six) hours as needed for nausea. 20 tablet 0   pantoprazole (PROTONIX) 40 MG tablet Take 1 tablet (40 mg total) by mouth daily. 30 tablet 0   polyethylene glycol (MIRALAX / GLYCOLAX) 17 g packet Take 17 g by mouth daily as needed for moderate constipation. 14 each 0   No facility-administered medications prior to visit.     Objective:     There were no vitals taken for this visit.         Assessment   No problem-specific Assessment & Plan notes found for this encounter.     Christinia Gully, MD 10/08/2021

## 2021-12-12 ENCOUNTER — Emergency Department (HOSPITAL_COMMUNITY): Payer: Medicare HMO

## 2021-12-12 ENCOUNTER — Emergency Department (HOSPITAL_COMMUNITY)
Admission: EM | Admit: 2021-12-12 | Discharge: 2021-12-12 | Disposition: A | Discharge status: Home / Self Care | Payer: Medicare HMO | Attending: Emergency Medicine | Admitting: Emergency Medicine

## 2021-12-12 ENCOUNTER — Other Ambulatory Visit: Payer: Self-pay

## 2021-12-12 ENCOUNTER — Encounter (HOSPITAL_COMMUNITY): Payer: Self-pay

## 2021-12-12 DIAGNOSIS — I110 Hypertensive heart disease with heart failure: Secondary | ICD-10-CM | POA: Insufficient documentation

## 2021-12-12 DIAGNOSIS — R319 Hematuria, unspecified: Secondary | ICD-10-CM | POA: Diagnosis present

## 2021-12-12 DIAGNOSIS — J449 Chronic obstructive pulmonary disease, unspecified: Secondary | ICD-10-CM | POA: Insufficient documentation

## 2021-12-12 DIAGNOSIS — E1165 Type 2 diabetes mellitus with hyperglycemia: Secondary | ICD-10-CM | POA: Insufficient documentation

## 2021-12-12 DIAGNOSIS — I509 Heart failure, unspecified: Secondary | ICD-10-CM | POA: Diagnosis not present

## 2021-12-12 DIAGNOSIS — N3091 Cystitis, unspecified with hematuria: Secondary | ICD-10-CM | POA: Diagnosis not present

## 2021-12-12 DIAGNOSIS — Z7982 Long term (current) use of aspirin: Secondary | ICD-10-CM | POA: Insufficient documentation

## 2021-12-12 DIAGNOSIS — Z794 Long term (current) use of insulin: Secondary | ICD-10-CM | POA: Diagnosis not present

## 2021-12-12 DIAGNOSIS — F17200 Nicotine dependence, unspecified, uncomplicated: Secondary | ICD-10-CM | POA: Insufficient documentation

## 2021-12-12 DIAGNOSIS — D72829 Elevated white blood cell count, unspecified: Secondary | ICD-10-CM | POA: Insufficient documentation

## 2021-12-12 DIAGNOSIS — Z7951 Long term (current) use of inhaled steroids: Secondary | ICD-10-CM | POA: Diagnosis not present

## 2021-12-12 LAB — URINALYSIS, ROUTINE W REFLEX MICROSCOPIC
Bilirubin Urine: NEGATIVE
Glucose, UA: >=500 mg/dL — AB
Ketones, ur: NEGATIVE mg/dL
Nitrite: NEGATIVE
Protein, ur: 30 mg/dL — AB
RBC / HPF: >50 RBC/hpf — ABNORMAL HIGH (ref 0–5)
Specific Gravity, Urine: 1.024 (ref 1.005–1.030)
WBC, UA: >50 WBC/hpf — ABNORMAL HIGH (ref 0–5)
pH: 7 (ref 5.0–8.0)

## 2021-12-12 LAB — I-STAT VENOUS BLOOD GAS, ED
Acid-Base Excess: 7 mmol/L — ABNORMAL HIGH (ref 0.0–2.0)
Bicarbonate: 33.3 mmol/L — ABNORMAL HIGH (ref 20.0–28.0)
Calcium, Ion: 1.09 mmol/L — ABNORMAL LOW (ref 1.15–1.40)
HCT: 40 % (ref 39.0–52.0)
Hemoglobin: 13.6 g/dL (ref 13.0–17.0)
O2 Saturation: 45 %
Potassium: 3.3 mmol/L — ABNORMAL LOW (ref 3.5–5.1)
Sodium: 140 mmol/L (ref 135–145)
TCO2: 35 mmol/L — ABNORMAL HIGH (ref 22–32)
pCO2, Ven: 51.9 mmHg (ref 44–60)
pH, Ven: 7.416 (ref 7.25–7.43)
pO2, Ven: 25 mmHg — CL (ref 32–45)

## 2021-12-12 LAB — CBG MONITORING, ED
Glucose-Capillary: 467 mg/dL — ABNORMAL HIGH (ref 70–99)
Glucose-Capillary: 365 mg/dL — ABNORMAL HIGH (ref 70–99)

## 2021-12-12 LAB — BASIC METABOLIC PANEL
Anion gap: 17 — ABNORMAL HIGH (ref 5–15)
BUN: 5 mg/dL — ABNORMAL LOW (ref 8–23)
CO2: 29 mmol/L (ref 22–32)
Calcium: 9.2 mg/dL (ref 8.9–10.3)
Chloride: 89 mmol/L — ABNORMAL LOW (ref 98–111)
Creatinine, Ser: 0.81 mg/dL (ref 0.61–1.24)
GFR, Estimated: >60 mL/min (ref >60)
Glucose, Bld: 474 mg/dL — ABNORMAL HIGH (ref 70–99)
Potassium: 3.4 mmol/L — ABNORMAL LOW (ref 3.5–5.1)
Sodium: 135 mmol/L (ref 135–145)

## 2021-12-12 LAB — HEPATIC FUNCTION PANEL
ALT: 16 U/L (ref 0–44)
AST: 28 U/L (ref 15–41)
Albumin: 3.1 g/dL — ABNORMAL LOW (ref 3.5–5.0)
Alkaline Phosphatase: 85 U/L (ref 38–126)
Bilirubin, Direct: 0.3 mg/dL — ABNORMAL HIGH (ref 0.0–0.2)
Indirect Bilirubin: 0.8 mg/dL (ref 0.3–0.9)
Total Bilirubin: 1.1 mg/dL (ref 0.3–1.2)
Total Protein: 6.5 g/dL (ref 6.5–8.1)

## 2021-12-12 LAB — CBC
HCT: 45.6 % (ref 39.0–52.0)
Hemoglobin: 15.6 g/dL (ref 13.0–17.0)
MCH: 27 pg (ref 26.0–34.0)
MCHC: 34.2 g/dL (ref 30.0–36.0)
MCV: 79 fL — ABNORMAL LOW (ref 80.0–100.0)
Platelets: 262 10*3/uL (ref 150–400)
RBC: 5.77 MIL/uL (ref 4.22–5.81)
RDW: 14.3 % (ref 11.5–15.5)
WBC: 14.4 10*3/uL — ABNORMAL HIGH (ref 4.0–10.5)
nRBC: 0 % (ref 0.0–0.2)

## 2021-12-12 LAB — TROPONIN I (HIGH SENSITIVITY)
Troponin I (High Sensitivity): 7 ng/L (ref <18)
Troponin I (High Sensitivity): 10 ng/L (ref <18)

## 2021-12-12 LAB — TSH: TSH: 0.854 u[IU]/mL (ref 0.350–4.500)

## 2021-12-12 LAB — PROTIME-INR
INR: 1.1 (ref 0.8–1.2)
Prothrombin Time: 13.7 seconds (ref 11.4–15.2)

## 2021-12-12 MED ORDER — SODIUM CHLORIDE 0.9 % IV BOLUS
1000.0000 mL | Freq: Once | INTRAVENOUS | Status: AC
  Administered 2021-12-12: 1000 mL via INTRAVENOUS

## 2021-12-12 MED ORDER — IOHEXOL 300 MG/ML  SOLN
80.0000 mL | Freq: Once | INTRAMUSCULAR | Status: AC | PRN
Start: 2021-12-12 — End: 2021-12-12
  Administered 2021-12-12: 80 mL via INTRAVENOUS

## 2021-12-12 MED ORDER — CEPHALEXIN 500 MG PO CAPS: 500.0000 mg | ORAL_CAPSULE | Freq: Four times a day (QID) | ORAL | 0 refills | Status: AC

## 2021-12-12 MED ORDER — INSULIN ASPART 100 UNIT/ML IJ SOLN
10.0000 [IU] | Freq: Once | INTRAMUSCULAR | Status: AC
  Administered 2021-12-12: 10 [IU] via SUBCUTANEOUS

## 2021-12-12 MED ORDER — POTASSIUM CHLORIDE CRYS ER 20 MEQ PO TBCR
60.0000 meq | EXTENDED_RELEASE_TABLET | Freq: Once | ORAL | Status: AC
  Administered 2021-12-12: 60 meq via ORAL
  Filled 2021-12-12: qty 3

## 2021-12-12 MED ORDER — SODIUM CHLORIDE 0.9 % IV SOLN
1.0000 g | Freq: Once | INTRAVENOUS | Status: AC
  Administered 2021-12-12: 1 g via INTRAVENOUS
  Filled 2021-12-12: qty 10

## 2021-12-12 NOTE — ED Provider Notes (Signed)
[place] EMERGENCY DEPARTMENT Provider Note   CSN: [id number] Arrival date & time: 12/12/21  1409     History  Chief Complaint  Patient presents with   Hematuria    Troy [name] is a 61 y.o. male with a past medical history of diabetes mellitus, heart failure with preserved EF (echo 01/2021 shows EF 70 to 75%), hypertension, COPD, GERD.  Presents to the emergency department for complaint of hematuria.  Patient reports that he started having hematuria today.  Has had gross hematuria with every urination.    Patient reports that he is noncompliant with his insulin medication.  Patient reports previous history of atrial fibrillation in March 2023.  Patient was not started on any medications I did not have any cardiology follow-up.  Patient denies any associated chest pain, shortness of breath, palpitations, leg swelling or tenderness, hemoptysis, lightheadedness, dizziness, or syncope.  Denies any fever, chills, abdominal pain, nausea, vomiting, constipation, diarrhea, blood in stool, melena, dysuria, urinary frequency, urinary urgency, swelling or tenderness to genitals, genital sores or lesions, penile discharge.  Patient endorses tobacco use.  Smokes 1 pack a day for 35 to 40 years.  Denies any illicit drug or alcohol use.   Hematuria Pertinent negatives include no chest pain, no abdominal pain, no headaches and no shortness of breath.       Home Medications Prior to Admission medications   Medication Sig Start Date End Date Taking? Authorizing Provider  amitriptyline (ELAVIL) 50 MG tablet Take 1 tablet (50 mg total) by mouth at bedtime. 08/27/21   Little Ishikawa, MD  aspirin EC 81 MG EC tablet Take 1 tablet (81 mg total) by mouth daily with breakfast. Swallow whole. 08/28/21   Little Ishikawa, MD  glucose blood (ONETOUCH VERIO) test strip USE TO CHECK BLOOD SUGAR 1-2 TIME(S) DAILY. 06/07/19   [provider]  Insulin Degludec (TRESIBA) 100  UNIT/ML SOLN Inject 24 Units into the skin in the morning and at bedtime. Patient not taking: Reported on 08/16/2021    [provider]  Insulin Pen Needle (PEN NEEDLES) 32G X 4 MM MISC by Does not apply route. 12/18/19   [provider]  ipratropium (ATROVENT) 0.02 % nebulizer solution Take 2.5 mLs (0.5 mg total) by nebulization 4 (four) times daily as needed for wheezing or shortness of breath. 02/13/21   Denton Brick, Courage, MD  levalbuterol (XOPENEX) 0.63 MG/3ML nebulizer solution Take 3 mLs (0.63 mg total) by nebulization 2 (two) times daily. 08/27/21   Little Ishikawa, MD  levalbuterol Penne Lash) 0.63 MG/3ML nebulizer solution Take 3 mLs (0.63 mg total) by nebulization every 2 (two) hours as needed for wheezing or shortness of breath. 08/27/21   Little Ishikawa, MD  mometasone-formoterol [facility]) 200-5 MCG/ACT AERO Inhale 2 puffs into the lungs 2 (two) times daily. 08/27/21   Little Ishikawa, MD  ondansetron (ZOFRAN) 4 MG tablet Take 1 tablet (4 mg total) by mouth every 6 (six) hours as needed for nausea. 08/27/21   Little Ishikawa, MD  pantoprazole (PROTONIX) 40 MG tablet Take 1 tablet (40 mg total) by mouth daily. 08/28/21   Little Ishikawa, MD  polyethylene glycol (MIRALAX / GLYCOLAX) 17 g packet Take 17 g by mouth daily as needed for moderate constipation. 08/27/21   Little Ishikawa, MD      Allergies    Hydromorphone hcl er, Tapentadol, and Exalamide    Review of Systems   Review of Systems  Constitutional:  Negative  for chills and fever.  Respiratory:  Negative for shortness of breath.   Cardiovascular:  Negative for chest pain, palpitations and leg swelling.  Gastrointestinal:  Negative for abdominal pain, nausea and vomiting.  Genitourinary:  Positive for hematuria. Negative for difficulty urinating, dysuria, flank pain, frequency, genital sores, penile discharge, penile pain, penile swelling, scrotal swelling, testicular pain and urgency.   Musculoskeletal:  Negative for back pain and neck pain.  Skin:  Negative for color change and rash.  Neurological:  Negative for dizziness, syncope, light-headedness and headaches.  Psychiatric/Behavioral:  Negative for confusion.     Physical Exam Updated Vital Signs BP (!) 159/85   Pulse (!) 115   Temp 97.7 F (36.5 C)   Resp 18   SpO2 98%  Physical Exam Vitals and nursing note reviewed.  Constitutional:      General: He is not in acute distress.    Appearance: He is not ill-appearing, toxic-appearing or diaphoretic.  HENT:     Head: Normocephalic.  Eyes:     General: No scleral icterus.       Right eye: No discharge.        Left eye: No discharge.  Cardiovascular:     Rate and Rhythm: Tachycardia present. Rhythm irregularly irregular.     Pulses:          Radial pulses are 2+ on the right side and 2+ on the left side.     Heart sounds: Normal heart sounds, S1 normal and S2 normal. Heart sounds not distant. No murmur heard. Pulmonary:     Effort: Pulmonary effort is normal. No tachypnea, bradypnea or respiratory distress.     Breath sounds: Normal breath sounds. No stridor.  Abdominal:     General: Abdomen is protuberant. Bowel sounds are normal. There is no distension. There are no signs of injury.     Palpations: Abdomen is soft. There is no mass or pulsatile mass.     Tenderness: There is no abdominal tenderness. There is no right CVA tenderness, left CVA tenderness, guarding or rebound.     Hernia: There is no hernia in the umbilical area or ventral area.  Skin:    General: Skin is warm and dry.  Neurological:     General: No focal deficit present.     Mental Status: He is alert.  Psychiatric:        Behavior: Behavior is cooperative.     ED Results / Procedures / Treatments   Labs (all labs ordered are listed, but only abnormal results are displayed) Labs Reviewed  BASIC METABOLIC PANEL - Abnormal; Notable for the following components:      Result Value    Potassium 3.4 (*)    Chloride 89 (*)    Glucose, Bld 474 (*)    BUN 5 (*)    Anion gap 17 (*)    All other components within normal limits  CBC - Abnormal; Notable for the following components:   WBC 14.4 (*)    MCV 79.0 (*)    All other components within normal limits  URINALYSIS, ROUTINE W REFLEX MICROSCOPIC - Abnormal; Notable for the following components:   Color, Urine STRAW (*)    Glucose, UA >=500 (*)    Hgb urine dipstick LARGE (*)    Protein, ur 30 (*)    Leukocytes,Ua MODERATE (*)    RBC / HPF >50 (*)    WBC, UA >50 (*)    Bacteria, UA RARE (*)    All  other components within normal limits  HEPATIC FUNCTION PANEL - Abnormal; Notable for the following components:   Albumin 3.1 (*)    Bilirubin, Direct 0.3 (*)    All other components within normal limits  CBG MONITORING, ED - Abnormal; Notable for the following components:   Glucose-Capillary 467 (*)    All other components within normal limits  CBG MONITORING, ED - Abnormal; Notable for the following components:   Glucose-Capillary 365 (*)    All other components within normal limits  I-STAT VENOUS BLOOD GAS, ED - Abnormal; Notable for the following components:   pO2, Ven 25 (*)    Bicarbonate 33.3 (*)    TCO2 35 (*)    Acid-Base Excess 7.0 (*)    Potassium 3.3 (*)    Calcium, Ion 1.09 (*)    All other components within normal limits  URINE CULTURE  TSH  PROTIME-INR  BLOOD GAS, VENOUS  TROPONIN I (HIGH SENSITIVITY)  TROPONIN I (HIGH SENSITIVITY)    EKG EKG Interpretation  Date/Time:  Saturday December 12 2021 14:27:46 EDT Ventricular Rate:  123 PR Interval:  140 QRS Duration: 82 QT Interval:  368 QTC Calculation: 526 R Axis:   74 Text Interpretation: Sinus tachycardia with Premature atrial complexes with Abberant conduction Non-specific ST-t changes Confirmed by Lajean Saver [phone]) on 12/12/2021 4:22:42 PM  Radiology CT ABDOMEN PELVIS W CONTRAST  Result Date: 12/12/2021 CLINICAL DATA:  Gross  hematuria. EXAM: CT ABDOMEN AND PELVIS WITH CONTRAST TECHNIQUE: Multidetector CT imaging of the abdomen and pelvis was performed using the standard protocol following bolus administration of intravenous contrast. RADIATION DOSE REDUCTION: This exam was performed according to the departmental dose-optimization program which includes automated exposure control, adjustment of the mA and/or kV according to patient size and/or use of iterative reconstruction technique. CONTRAST:  73m OMNIPAQUE IOHEXOL 300 MG/ML  SOLN COMPARISON:  CT abdomen and pelvis 08/18/2021 FINDINGS: Lower chest: There is atelectasis or scarring in the left lung base. Hepatobiliary: There is diffuse fatty infiltration of the liver. No focal liver lesion identified. The liver is mildly enlarged. Gallbladder and bile ducts are within normal limits. There is a capsular calcification in the liver, nonspecific. Pancreas: Unremarkable. No pancreatic ductal dilatation or surrounding inflammatory changes. Spleen: Normal in size without focal abnormality. Adrenals/Urinary Tract: There is a rounded hypodensity in the left kidney which is too small to characterize, likely a cyst. Otherwise, the kidneys and adrenal glands are within normal limits. There is diffuse bladder wall thickening with mild surrounding inflammation. Stomach/Bowel: Stomach is within normal limits. Appendix appears normal. No evidence of bowel wall thickening, distention, or inflammatory changes. Vascular/Lymphatic: Aortic atherosclerosis. No enlarged abdominal or pelvic lymph nodes. Reproductive: Prostate is unremarkable. Other: No abdominal wall hernia or abnormality. No abdominopelvic ascites. Musculoskeletal: No acute or significant osseous findings. IMPRESSION: 1. Diffuse bladder wall thickening with surrounding inflammation concerning for cystitis. Recommend clinical correlation and follow-up. 2. Hepatomegaly and hepatic steatosis. 3.  Aortic Atherosclerosis (ICD10-I70.0).  Electronically Signed   By: ARonney AstersM.D.   On: 12/12/2021 17:28    Procedures Procedures    Medications Ordered in ED Medications  sodium chloride 0.9 % bolus 1,000 mL (0 mLs Intravenous Stopped 12/12/21 1822)  insulin aspart (novoLOG) injection 10 Units (10 Units Subcutaneous Given 12/12/21 1649)  iohexol (OMNIPAQUE) 300 MG/ML solution 80 mL (80 mLs Intravenous Contrast Given 12/12/21 1712)  potassium chloride SA (KLOR-CON M) CR tablet 60 mEq (60 mEq Oral Given 12/12/21 1818)  sodium chloride 0.9 % bolus 1,000  mL (0 mLs Intravenous Stopped 12/12/21 2112)  cefTRIAXone (ROCEPHIN) 1 g in sodium chloride 0.9 % 100 mL IVPB (0 g Intravenous Stopped 12/12/21 2217)    ED Course/ Medical Decision Making/ A&P                            Medical Decision Making Amount and/or Complexity of Data Reviewed Labs: ordered. Decision-making details documented in ED Course. Radiology: ordered.  Risk Prescription drug management.   Alert 61 year old male in no acute stress, nontoxic-appearing.  Presents to the emergency department with a chief complaint of gross hematuria.  Information obtained from patient and patient's wife at bedside.  I reviewed patient's past medical records including previous provider notes including from admission/discharge, labs, and imaging.  Patient has medical history as outlined in HPI which complicates his care.  Due to patient's hematuria and smoking history concern for possible bladder malignancy versus renal/ureteral calculus.  Will obtain CT abdomen pelvis with contrast for further evaluation.  EKG shows sinus tachycardia with PAC and PVC.Marland Kitchen  Patient's rate is in the 100s.  Patient does have history of diabetes and is noncompliant with insulin regimen.  Noted to have glucose of 474 with anion gap elevated at 17, bicarb within normal limits.  Will check VBG to evaluate for possible DKA.  Patient given 1 L fluid bolus at this time.  I personally viewed interpret patient's  lab results.  Pertinent findings include: -No signs of anemia -Glucose 474, anion gap 17; bicarb within normal limits -VBG pH within normal limits -Leukocytosis 14.4 -Potassium 3.4 -Urinalysis shows bacteria rare, WBC greater than 50, RBC greater than 50, leuks moderate, nitrite negative, glucose greater than 500  I personally viewed interpret patient's CT imaging.  Agree with radiology interpretation of diffuse bladder wall thickening with surrounding inflammation concerning for cystitis.  Hepatomegaly and hepatic steatosis.  With VBG pH and bicarb within normal limits low suspicion for DKA at this time.  Patient had improvement in CBG after receiving fluid bolus and 10 units of NovoLog subcutaneously.  Patient advised to restart his home medications and follow-up closely with his PCP for further management.  Suspect that patient's hematuria is secondary to cystitis.  We will start patient on ceftriaxone in the emergency department and sent home on course of Keflex.  Patient will need to follow-up with urology for repeat evaluation.  Patient has improvement in heart rate after receiving fluid bolus.  Patient resolution of his tachycardia.  Will discharge patient at this time.  Patient care discussed with attending physician Dr. Ashok Cordia.  Based on patient's chief complaint, I considered admission might be necessary, however after reassuring ED workup feel patient is reasonable for discharge.  Discussed results, findings, treatment and follow up. Patient and patient's wife advised of return precautions. Patient and patient's wife verbalized understanding and agreed with plan.  Portions of this note were generated with Lobbyist. Dictation errors may occur despite best attempts at proofreading.         Final Clinical Impression(s) / ED Diagnoses Final diagnoses:  Hemorrhagic cystitis  Hyperglycemia due to diabetes mellitus ([place])    Rx / DC Orders ED Discharge Orders           Ordered    cephALEXin (KEFLEX) 500 MG capsule  4 times daily        12/12/21 2110              Loni Beckwith, [place] 12/13/21  1587    Lajean Saver, MD 12/16/21 1726

## 2021-12-14 LAB — URINE CULTURE: Culture: 50000 — AB

## 2021-12-15 ENCOUNTER — Telehealth: Payer: Self-pay | Admitting: Emergency Medicine

## 2021-12-15 NOTE — Progress Notes (Signed)
ED Antimicrobial Stewardship Positive Culture Follow Up   PURNELL HIJAZI is an 61 y.o. male who presented to [facility] on 12/12/2021 with a chief complaint of  Chief Complaint  Patient presents with   Hematuria    Recent Results (from the past 720 hour(s))  Urine Culture     Status: Abnormal   Collection Time: 12/12/21  2:32 PM   Specimen: Urine, Clean Catch  Result Value Ref Range Status   Specimen Description URINE, CLEAN CATCH  Final   Special Requests   Final    NONE Performed at [facility], [address] [place], [place] [address]    Culture 50,000 COLONIES/mL ENTEROCOCCUS FAECALIS (A)  Final   Report Status 12/14/2021 FINAL  Final   Organism ID, Bacteria ENTEROCOCCUS FAECALIS (A)  Final      Susceptibility   Enterococcus faecalis - MIC*    AMPICILLIN <=2 SENSITIVE Sensitive     NITROFURANTOIN <=16 SENSITIVE Sensitive     VANCOMYCIN 1 SENSITIVE Sensitive     * 50,000 COLONIES/mL ENTEROCOCCUS FAECALIS    [x]  Treated with cephalexin, organism resistant to prescribed antimicrobial  New antibiotic prescription: amoxicillin 500mg  by mouth every 12 hours for 5 days  ED Provider: Dr. Laurena Spies 12/15/2021, 10:39 AM Clinical Pharmacist Monday - Friday phone -  [phone] Saturday - Sunday phone - [phone]

## 2022-04-19 ENCOUNTER — Emergency Department (HOSPITAL_COMMUNITY)
Admission: EM | Admit: 2022-04-19 | Discharge: 2022-04-19 | Disposition: A | Discharge status: Home / Self Care | Payer: Medicare HMO | Attending: Emergency Medicine | Admitting: Emergency Medicine

## 2022-04-19 ENCOUNTER — Encounter (HOSPITAL_COMMUNITY): Payer: Self-pay | Admitting: *Deleted

## 2022-04-19 ENCOUNTER — Other Ambulatory Visit: Payer: Self-pay

## 2022-04-19 DIAGNOSIS — L0201 Cutaneous abscess of face: Secondary | ICD-10-CM | POA: Diagnosis present

## 2022-04-19 DIAGNOSIS — Z7984 Long term (current) use of oral hypoglycemic drugs: Secondary | ICD-10-CM | POA: Insufficient documentation

## 2022-04-19 DIAGNOSIS — E11628 Type 2 diabetes mellitus with other skin complications: Secondary | ICD-10-CM | POA: Diagnosis not present

## 2022-04-19 DIAGNOSIS — Z794 Long term (current) use of insulin: Secondary | ICD-10-CM | POA: Diagnosis not present

## 2022-04-19 DIAGNOSIS — I509 Heart failure, unspecified: Secondary | ICD-10-CM | POA: Insufficient documentation

## 2022-04-19 DIAGNOSIS — J449 Chronic obstructive pulmonary disease, unspecified: Secondary | ICD-10-CM | POA: Insufficient documentation

## 2022-04-19 DIAGNOSIS — Z7982 Long term (current) use of aspirin: Secondary | ICD-10-CM | POA: Insufficient documentation

## 2022-04-19 HISTORY — DX: Unspecified cirrhosis of liver: K74.60

## 2022-04-19 LAB — CBG MONITORING, ED: Glucose-Capillary: 450 mg/dL — ABNORMAL HIGH (ref 70–99)

## 2022-04-19 MED ORDER — DOXYCYCLINE HYCLATE 100 MG PO CAPS: 100.0000 mg | ORAL_CAPSULE | Freq: Two times a day (BID) | ORAL | 0 refills | Status: DC

## 2022-04-19 MED ORDER — METFORMIN HCL 500 MG PO TABS: 500.0000 mg | ORAL_TABLET | Freq: Two times a day (BID) | ORAL | 0 refills | Status: DC

## 2022-04-19 NOTE — ED Triage Notes (Signed)
Pt c/o redness, swelling, soreness to nose x few days. 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

## 2022-04-19 NOTE — ED Notes (Signed)
Pt and wife requesting CBG prior to pt leaving. CBG result was 450. Pt wheeled out via wheelchair by wife.

## 2022-04-19 NOTE — Discharge Instructions (Addendum)
Please do warm compresses to the bridge of your nose 4 times daily.  Please take the antibiotics for the entire course as prescribed.  Please follow-up with your primary care doctor.  I recommend taking metformin once daily for 5 to 7 days before beginning a twice daily dose.  Ultimately may need to restart the insulin please follow-up with your primary care doctor regarding this.

## 2022-04-19 NOTE — ED Provider Notes (Signed)
[facility] EMERGENCY DEPARTMENT Provider Note   CSN: [id number] Arrival date & time: 04/19/22  2703     History  No chief complaint on file.   Troy [name] is a 61 y.o. male.  HPI  Patient is a 61 year old gentleman with a past medical history significant for DM 2, HLD, CHF, left BKA, liver cirrhosis, COPD  Patient presents emergency room today with complaints of swelling to the bridge of his nose has been ongoing for 2 days.  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  He states he does not control his blood sugar at home.  He states he does not like the injections that are associated with his insulin administration.  He denies any polyuria polydipsia he denies any nausea vomiting abdominal pain chest pain or difficulty breathing lightheaded dizziness or fatigue.  He states he is here primarily for the pain in the bridge of his nose.  He states is been associated some swelling redness and discomfort in this area.  He denies any fevers at home.    Home Medications Prior to Admission medications   Medication Sig Start Date End Date Taking? Authorizing Provider  doxycycline (VIBRAMYCIN) 100 MG capsule Take 1 capsule (100 mg total) by mouth 2 (two) times daily. 04/19/22  Yes Pati Gallo S, PA  metFORMIN (GLUCOPHAGE) 500 MG tablet Take 1 tablet (500 mg total) by mouth 2 (two) times daily with a meal. 04/19/22 05/19/22 Yes Nicki Gracy S, PA  amitriptyline (ELAVIL) 50 MG tablet Take 1 tablet (50 mg total) by mouth at bedtime. 08/27/21   Little Ishikawa, MD  aspirin EC 81 MG EC tablet Take 1 tablet (81 mg total) by mouth daily with breakfast. Swallow whole. 08/28/21   Little Ishikawa, MD  glucose blood (ONETOUCH VERIO) test strip USE TO CHECK BLOOD SUGAR 1-2 TIME(S) DAILY. 06/07/19   [provider]  Insulin Degludec (TRESIBA) 100 UNIT/ML SOLN Inject 24 Units into the skin in the morning and at  bedtime. Patient not taking: Reported on 08/16/2021    [provider]  Insulin Pen Needle (PEN NEEDLES) 32G X 4 MM MISC by Does not apply route. 12/18/19   [provider]  ipratropium (ATROVENT) 0.02 % nebulizer solution Take 2.5 mLs (0.5 mg total) by nebulization 4 (four) times daily as needed for wheezing or shortness of breath. 02/13/21   Denton Brick, Courage, MD  levalbuterol (XOPENEX) 0.63 MG/3ML nebulizer solution Take 3 mLs (0.63 mg total) by nebulization 2 (two) times daily. 08/27/21   Little Ishikawa, MD  levalbuterol Penne Lash) 0.63 MG/3ML nebulizer solution Take 3 mLs (0.63 mg total) by nebulization every 2 (two) hours as needed for wheezing or shortness of breath. 08/27/21   Little Ishikawa, MD  mometasone-formoterol [facility]) 200-5 MCG/ACT AERO Inhale 2 puffs into the lungs 2 (two) times daily. 08/27/21   Little Ishikawa, MD  ondansetron (ZOFRAN) 4 MG tablet Take 1 tablet (4 mg total) by mouth every 6 (six) hours as needed for nausea. 08/27/21   Little Ishikawa, MD  pantoprazole (PROTONIX) 40 MG tablet Take 1 tablet (40 mg total) by mouth daily. 08/28/21   Little Ishikawa, MD  polyethylene glycol (MIRALAX / GLYCOLAX) 17 g packet Take 17 g by mouth daily as needed for moderate constipation. 08/27/21   Little Ishikawa, MD      Allergies    Hydromorphone hcl er, Tapentadol, and Exalamide  Review of Systems   Review of Systems  Physical Exam Updated Vital Signs BP (!) 155/89 (BP Location: Left Arm)   Pulse 96   Temp 98.6 F (37 C) (Oral)   Resp 16   Ht 6' (1.829 m)   Wt 78.9 kg   SpO2 98%   BMI 23.60 kg/m  Physical Exam Vitals and nursing note reviewed.  Constitutional:      General: He is not in acute distress.    Appearance: Normal appearance. He is not ill-appearing.  HENT:     Head: Normocephalic and atraumatic.     Nose:     Comments: Bridge of nose with cellulitic wound with purulence just below the skin.  Nose otherwise normal.  No  septal hematomas or epistaxis.  Bilateral nares are patent Eyes:     General: No scleral icterus.       Right eye: No discharge.        Left eye: No discharge.     Conjunctiva/sclera: Conjunctivae normal.  Pulmonary:     Effort: Pulmonary effort is normal.     Breath sounds: No stridor.  Neurological:     Mental Status: He is alert and oriented to person, place, and time. Mental status is at baseline.     ED Results / Procedures / Treatments   Labs (all labs ordered are listed, but only abnormal results are displayed) Labs Reviewed - No data to display  EKG None  Radiology No results found.  Procedures Procedures    Medications Ordered in ED Medications - No data to display  ED Course/ Medical Decision Making/ A&P                           Medical Decision Making  Patient is a 61 year old gentleman with a past medical history significant for DM 2, HLD, CHF, left BKA, liver cirrhosis, COPD  Patient presents emergency room today with complaints of swelling to the bridge of his nose has been ongoing for 2 days.  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  He states he does not control his blood sugar at home.  He states he does not like the injections that are associated with his insulin administration.  He denies any polyuria polydipsia he denies any nausea vomiting abdominal pain chest pain or difficulty breathing lightheaded dizziness or fatigue.  He states he is here primarily for the pain in the bridge of his nose.  He states is been associated some swelling redness and discomfort in this area.  He denies any fevers at home.  Physical exam notable for purulent wound to the bridge of nose.  This was deroofed with an 18-gauge needle.  Scant purulence was expressed.  Had a lengthy discussion with patient about his uncontrolled diabetes.  He is amenable to starting metformin and following up with a PCP.   He currently does not have a primary care doctor.  Reviewed labs from 4 months ago.  Seems that his blood sugars stable he remained in the 400 range.  He has no polyuria, polydipsia and denies any fatigue lightheadedness dizziness or any other associated symptoms today.  He is here primarily for his nose discomfort.  We will treat this with doxycycline and initiate metformin.  He understands importance of close PCP follow-up.  Final Clinical Impression(s) / ED Diagnoses Final diagnoses:  Cutaneous abscess of face  Type 2 diabetes  mellitus with other skin complication, unspecified whether long term insulin use ([place])    Rx / DC Orders ED Discharge Orders          Ordered    doxycycline (VIBRAMYCIN) 100 MG capsule  2 times daily        04/19/22 0929    metFORMIN (GLUCOPHAGE) 500 MG tablet  2 times daily with meals        04/19/22 0929              Tedd Sias, PA 04/19/22 0930    Fransico Meadow, MD 04/21/22 1610

## 2022-05-01 ENCOUNTER — Emergency Department (HOSPITAL_COMMUNITY)
Admission: EM | Admit: 2022-05-01 | Discharge: 2022-05-01 | Disposition: A | Discharge status: Home / Self Care | Payer: Medicare HMO | Attending: Emergency Medicine | Admitting: Emergency Medicine

## 2022-05-01 ENCOUNTER — Encounter (HOSPITAL_COMMUNITY): Payer: Self-pay | Admitting: Emergency Medicine

## 2022-05-01 ENCOUNTER — Other Ambulatory Visit: Payer: Self-pay

## 2022-05-01 DIAGNOSIS — E876 Hypokalemia: Secondary | ICD-10-CM | POA: Diagnosis not present

## 2022-05-01 DIAGNOSIS — Z794 Long term (current) use of insulin: Secondary | ICD-10-CM | POA: Diagnosis not present

## 2022-05-01 DIAGNOSIS — R319 Hematuria, unspecified: Secondary | ICD-10-CM

## 2022-05-01 DIAGNOSIS — E1165 Type 2 diabetes mellitus with hyperglycemia: Secondary | ICD-10-CM | POA: Insufficient documentation

## 2022-05-01 DIAGNOSIS — Z7984 Long term (current) use of oral hypoglycemic drugs: Secondary | ICD-10-CM | POA: Diagnosis not present

## 2022-05-01 DIAGNOSIS — D72829 Elevated white blood cell count, unspecified: Secondary | ICD-10-CM | POA: Insufficient documentation

## 2022-05-01 DIAGNOSIS — R739 Hyperglycemia, unspecified: Secondary | ICD-10-CM

## 2022-05-01 LAB — PROTIME-INR
INR: 1 (ref 0.8–1.2)
Prothrombin Time: 13 seconds (ref 11.4–15.2)

## 2022-05-01 LAB — CBC WITH DIFFERENTIAL/PLATELET
Abs Immature Granulocytes: 0.07 10*3/uL (ref 0.00–0.07)
Basophils Absolute: 0.1 10*3/uL (ref 0.0–0.1)
Basophils Relative: 0 %
Eosinophils Absolute: 0.1 10*3/uL (ref 0.0–0.5)
Eosinophils Relative: 1 %
HCT: 44.9 % (ref 39.0–52.0)
Hemoglobin: 15.5 g/dL (ref 13.0–17.0)
Immature Granulocytes: 0 %
Lymphocytes Relative: 16 %
Lymphs Abs: 3 10*3/uL (ref 0.7–4.0)
MCH: 28.3 pg (ref 26.0–34.0)
MCHC: 34.5 g/dL (ref 30.0–36.0)
MCV: 81.9 fL (ref 80.0–100.0)
Monocytes Absolute: 0.7 10*3/uL (ref 0.1–1.0)
Monocytes Relative: 4 %
Neutro Abs: 14.2 10*3/uL — ABNORMAL HIGH (ref 1.7–7.7)
Neutrophils Relative %: 79 %
Platelets: 201 10*3/uL (ref 150–400)
RBC: 5.48 MIL/uL (ref 4.22–5.81)
RDW: 12.8 % (ref 11.5–15.5)
WBC: 18.1 10*3/uL — ABNORMAL HIGH (ref 4.0–10.5)
nRBC: 0 % (ref 0.0–0.2)

## 2022-05-01 LAB — BASIC METABOLIC PANEL
Anion gap: 11 (ref 5–15)
BUN: 12 mg/dL (ref 8–23)
CO2: 26 mmol/L (ref 22–32)
Calcium: 9.1 mg/dL (ref 8.9–10.3)
Chloride: 99 mmol/L (ref 98–111)
Creatinine, Ser: 0.77 mg/dL (ref 0.61–1.24)
GFR, Estimated: >60 mL/min (ref >60)
Glucose, Bld: 410 mg/dL — ABNORMAL HIGH (ref 70–99)
Potassium: 3.2 mmol/L — ABNORMAL LOW (ref 3.5–5.1)
Sodium: 136 mmol/L (ref 135–145)

## 2022-05-01 LAB — URINALYSIS, ROUTINE W REFLEX MICROSCOPIC
Bilirubin Urine: NEGATIVE
Glucose, UA: >=500 mg/dL — AB
Ketones, ur: NEGATIVE mg/dL
Nitrite: NEGATIVE
Protein, ur: 30 mg/dL — AB
Specific Gravity, Urine: 1.038 — ABNORMAL HIGH (ref 1.005–1.030)
WBC, UA: >50 WBC/hpf — ABNORMAL HIGH (ref 0–5)
pH: 7 (ref 5.0–8.0)

## 2022-05-01 LAB — CBG MONITORING, ED: Glucose-Capillary: 399 mg/dL — ABNORMAL HIGH (ref 70–99)

## 2022-05-01 MED ORDER — INSULIN ASPART 100 UNIT/ML IJ SOLN
10.0000 [IU] | Freq: Once | INTRAMUSCULAR | Status: AC
  Administered 2022-05-01: 10 [IU] via SUBCUTANEOUS
  Filled 2022-05-01: qty 1

## 2022-05-01 MED ORDER — POTASSIUM CHLORIDE ER 10 MEQ PO TBCR: 10.0000 meq | EXTENDED_RELEASE_TABLET | Freq: Every day | ORAL | 0 refills | Status: DC

## 2022-05-01 MED ORDER — SODIUM CHLORIDE 0.9 % IV SOLN
1.0000 g | Freq: Once | INTRAVENOUS | Status: AC
  Administered 2022-05-01: 1 g via INTRAVENOUS
  Filled 2022-05-01: qty 10

## 2022-05-01 MED ORDER — SODIUM CHLORIDE 0.9 % IV BOLUS
1000.0000 mL | Freq: Once | INTRAVENOUS | Status: AC
  Administered 2022-05-01: 1000 mL via INTRAVENOUS

## 2022-05-01 MED ORDER — CEPHALEXIN 500 MG PO CAPS: 500.0000 mg | ORAL_CAPSULE | Freq: Two times a day (BID) | ORAL | 0 refills | Status: AC

## 2022-05-01 NOTE — ED Provider Notes (Signed)
[facility] EMERGENCY DEPARTMENT Provider Note   CSN: [id number] Arrival date & time: 05/01/22  1139     History  Chief Complaint  Patient presents with   Hematuria    Troy [name] is a 61 y.o. male with history of poorly controlled diabetes, medicine noncompliance, presenting to the ED with complaint of hematuria.  Reports he noticed this about 2 days ago.  Frank blood in his urine.  Denies painful urination or abdominal pain.  He went to urgent care and referred to the ED because his "blood pressure and blood sugars are very high".  His wife at bedside reports that the patient has refused to take any of his diabetes medicine, including insulin.  He has lost his left lower leg during amputation due to diabetic complications in the past.  The patient tells me he just does not like taking his medicines.  HPI     Home Medications Prior to Admission medications   Medication Sig Start Date End Date Taking? Authorizing Provider  cephALEXin (KEFLEX) 500 MG capsule Take 1 capsule (500 mg total) by mouth 2 (two) times daily for 6 days. 05/02/22 05/08/22 Yes Analiz Tvedt, Carola Rhine, MD  oxyCODONE-acetaminophen (PERCOCET) 10-325 MG tablet Take 1 tablet by mouth every 4 (four) hours as needed for pain. 04/28/22  Yes [provider]  potassium chloride (KLOR-CON) 10 MEQ tablet Take 1 tablet (10 mEq total) by mouth daily for 15 doses. 05/01/22 05/16/22 Yes Dreamer Carillo, Carola Rhine, MD  amitriptyline (ELAVIL) 50 MG tablet Take 1 tablet (50 mg total) by mouth at bedtime. 08/27/21   Little Ishikawa, MD  amLODipine (NORVASC) 10 MG tablet Take 10 mg by mouth daily. 11/25/21   [provider]  aspirin EC 81 MG EC tablet Take 1 tablet (81 mg total) by mouth daily with breakfast. Swallow whole. 08/28/21   Little Ishikawa, MD  doxycycline (VIBRAMYCIN) 100 MG capsule Take 1 capsule (100 mg total) by mouth 2 (two) times daily. 04/19/22   Tedd Sias, PA  Dulaglutide 1.5 MG/0.5ML SOPN Inject  0.5 mLs into the skin once a week. 05/08/20   [provider]  gabapentin (NEURONTIN) 300 MG capsule Take 300 mg by mouth 4 (four) times daily. 12/30/21   [provider]  glucose blood (ONETOUCH VERIO) test strip USE TO CHECK BLOOD SUGAR 1-2 TIME(S) DAILY. 06/07/19   [provider]  Insulin Pen Needle (PEN NEEDLES) 32G X 4 MM MISC by Does not apply route. 12/18/19   [provider]  ipratropium (ATROVENT) 0.02 % nebulizer solution Take 2.5 mLs (0.5 mg total) by nebulization 4 (four) times daily as needed for wheezing or shortness of breath. 02/13/21   Roxan Hockey, MD  levalbuterol (XOPENEX) 0.63 MG/3ML nebulizer solution Take 3 mLs (0.63 mg total) by nebulization every 2 (two) hours as needed for wheezing or shortness of breath. 08/27/21   Little Ishikawa, MD  metFORMIN (GLUCOPHAGE) 500 MG tablet Take 1 tablet (500 mg total) by mouth 2 (two) times daily with a meal. 04/19/22 05/19/22  Fondaw, Kathleene Hazel, PA  mometasone-formoterol (DULERA) 200-5 MCG/ACT AERO Inhale 2 puffs into the lungs 2 (two) times daily. 08/27/21   Little Ishikawa, MD  ondansetron (ZOFRAN) 4 MG tablet Take 1 tablet (4 mg total) by mouth every 6 (six) hours as needed for nausea. 08/27/21   Little Ishikawa, MD  pantoprazole (PROTONIX) 40 MG tablet Take 1 tablet (40 mg total) by mouth daily. 08/28/21   Little Ishikawa,  MD  polyethylene glycol (MIRALAX / GLYCOLAX) 17 g packet Take 17 g by mouth daily as needed for moderate constipation. 08/27/21   Little Ishikawa, MD  tiZANidine (ZANAFLEX) 4 MG tablet Take 4-12 mg by mouth at bedtime as needed for muscle spasms. 12/30/21   [provider]  traZODone (DESYREL) 50 MG tablet Take 50-100 mg by mouth at bedtime. 03/21/22   [provider]      Allergies    Hydromorphone hcl er, Tapentadol, and Exalamide    Review of Systems   Review of Systems  Physical Exam Updated Vital Signs BP (!) 158/91   Pulse 93   Temp 98  F (36.7 C)   Resp 16   SpO2 97%  Physical Exam Constitutional:      General: He is not in acute distress. HENT:     Head: Normocephalic and atraumatic.  Eyes:     Conjunctiva/sclera: Conjunctivae normal.     Pupils: Pupils are equal, round, and reactive to light.  Cardiovascular:     Rate and Rhythm: Normal rate and regular rhythm.  Pulmonary:     Effort: Pulmonary effort is normal. No respiratory distress.  Abdominal:     General: There is no distension.     Tenderness: There is no abdominal tenderness.  Musculoskeletal:     Comments: Left lower AKA  Skin:    General: Skin is warm and dry.  Neurological:     General: No focal deficit present.     Mental Status: He is alert. Mental status is at baseline.  Psychiatric:        Mood and Affect: Mood normal.        Behavior: Behavior normal.     ED Results / Procedures / Treatments   Labs (all labs ordered are listed, but only abnormal results are displayed) Labs Reviewed  BASIC METABOLIC PANEL - Abnormal; Notable for the following components:      Result Value   Potassium 3.2 (*)    Glucose, Bld 410 (*)    All other components within normal limits  CBC WITH DIFFERENTIAL/PLATELET - Abnormal; Notable for the following components:   WBC 18.1 (*)    Neutro Abs 14.2 (*)    All other components within normal limits  URINALYSIS, ROUTINE W REFLEX MICROSCOPIC - Abnormal; Notable for the following components:   Color, Urine STRAW (*)    Specific Gravity, Urine 1.038 (*)    Glucose, UA >=500 (*)    Hgb urine dipstick MODERATE (*)    Protein, ur 30 (*)    Leukocytes,Ua SMALL (*)    WBC, UA >50 (*)    Bacteria, UA FEW (*)    All other components within normal limits  CBG MONITORING, ED - Abnormal; Notable for the following components:   Glucose-Capillary 399 (*)    All other components within normal limits  URINE CULTURE  PROTIME-INR  CBG MONITORING, ED    EKG None  Radiology No results  found.  Procedures Procedures    Medications Ordered in ED Medications  cefTRIAXone (ROCEPHIN) 1 g in sodium chloride 0.9 % 100 mL IVPB (1 g Intravenous New Bag/Given 05/01/22 1353)  sodium chloride 0.9 % bolus 1,000 mL (0 mLs Intravenous Stopped 05/01/22 1355)  insulin aspart (novoLOG) injection 10 Units (10 Units Subcutaneous Given 05/01/22 1354)    ED Course/ Medical Decision Making/ A&P  Medical Decision Making Amount and/or Complexity of Data Reviewed Labs: ordered.  Risk Prescription drug management.   This patient presents to the ED with concern for painless hematuria. This involves an extensive number of treatment options, and is a complaint that carries with it a high risk of complications and morbidity.  The differential diagnosis includes UTI versus other cause hemorrhagic cystitis versus neoplasm versus other  Co-morbidities that complicate the patient evaluation: diabetes at higher risk of infection  Additional history obtained from patient's wife  External records from outside source obtained and reviewed including hospital visit in June last year for similar hemorrhagic cystitis, CT imaging showing bladder thickening, and subsequent urine culture showed UTI, which successfully treated.  Patient reports that his hematuria resolved after antibiotics at that time.  I ordered and personally interpreted labs.  The pertinent results include: Mild hypokalemia potassium 3.2.  Leukocytosis white blood cell count 18.1.  Hyperglycemia, without anion gap, or evidence of DKA.  UA is bloody with many white blood cell count, bacteria, and also leukocytes.  Urine culture will be sent  The patient was maintained on a cardiac monitor.  I personally viewed and interpreted the cardiac monitored which showed an underlying rhythm of: Normal sinus rhythm  I ordered medication including Rocephin for suspected UTI as cause of cystitis.  IV insulin and IV fluids for  hyperglycemia.  I have reviewed the patients home medicines and have made adjustments as needed  Test Considered: I do not see an emergent indication for CT imaging of the abdomen.  After the interventions noted above, I reevaluated the patient and found that they have: stayed the same  Social Determinants of Health: counseled patient about importance of diabetic medication compliance and the dangers of untreated diabetes.  His wife at the bedside encouraged him also to be taking his medications.  Dispostion:  After consideration of the diagnostic results and the patients response to treatment, I feel that the patent would benefit from outpatient follow-up         Final Clinical Impression(s) / ED Diagnoses Final diagnoses:  Hematuria, unspecified type  Hyperglycemia  Hypokalemia    Rx / DC Orders ED Discharge Orders          Ordered    cephALEXin (KEFLEX) 500 MG capsule  2 times daily        05/01/22 1412    potassium chloride (KLOR-CON) 10 MEQ tablet  Daily        05/01/22 1412              Wyvonnia Dusky, MD 05/01/22 1415

## 2022-05-01 NOTE — Discharge Instructions (Addendum)
I strongly recommend you set up an appointment with your primary care provider for better control of your diabetes.  I strongly encourage you to continue taking your insulin and your regular medications at home.  Untreated diabetes can lead to very serious complications, including heart disease, stroke, kidney failure and death.  Your potassium level is also mildly low today at 3.2.  I prescribed you 15 days of potassium pills which she can take in the morning.  Your doctor's office should recheck your potassium level.

## 2022-05-01 NOTE — ED Triage Notes (Signed)
Pt reports blood in urine and high glucose. Pt reports not taking his insulin. Denies n/v.

## 2022-05-03 LAB — URINE CULTURE: Culture: NO GROWTH

## 2022-05-17 ENCOUNTER — Ambulatory Visit: Payer: Medicare HMO | Admitting: Orthopaedic Surgery

## 2022-05-17 ENCOUNTER — Ambulatory Visit (INDEPENDENT_AMBULATORY_CARE_PROVIDER_SITE_OTHER): Payer: Medicare HMO

## 2022-05-17 ENCOUNTER — Encounter: Payer: Self-pay | Admitting: Orthopaedic Surgery

## 2022-05-17 DIAGNOSIS — L97511 Non-pressure chronic ulcer of other part of right foot limited to breakdown of skin: Secondary | ICD-10-CM | POA: Diagnosis not present

## 2022-05-17 DIAGNOSIS — L97512 Non-pressure chronic ulcer of other part of right foot with fat layer exposed: Secondary | ICD-10-CM | POA: Diagnosis not present

## 2022-05-17 MED ORDER — DOXYCYCLINE HYCLATE 100 MG PO TABS: 100.0000 mg | ORAL_TABLET | Freq: Two times a day (BID) | ORAL | 1 refills | Status: DC

## 2022-05-17 NOTE — Progress Notes (Signed)
The patient is well-known to [name].  He has a history of a left below-knee amputation.  He also has a history of a right foot crush injury.  He is a poorly controlled diabetic and has been very noncompliant with his medications.  He has had a previous first ray resection.  He has developed a blister over this area.  On exam I was able to unroofed the callus using a 10 blade.  I unroofed the blister as well.  He was pain-free from this but certainly there was evidence of some purulence.  This did not track to the bone.  X-rays of the foot show no new cortical irregularities of the previous first ray resection down to the metatarsal head on the right foot.  He is going to soak this area daily for 15 minutes and either Epsom salt or antibacterial water.  He will then dried off and place Bactroban ointment over the wound.  I will send in some doxycycline and would like to reevaluate him in 2 weeks.  I counseled him about his diabetic complaints.  Recently his blood glucose was over 400 and this is likely leading to chronic changes in the foot.

## 2022-05-31 ENCOUNTER — Encounter: Payer: Self-pay | Admitting: Orthopaedic Surgery

## 2022-05-31 ENCOUNTER — Ambulatory Visit: Payer: Medicare HMO | Admitting: Orthopaedic Surgery

## 2022-05-31 DIAGNOSIS — L97511 Non-pressure chronic ulcer of other part of right foot limited to breakdown of skin: Secondary | ICD-10-CM | POA: Diagnosis not present

## 2022-05-31 NOTE — Progress Notes (Signed)
The patient comes in today for [name] to reassess and for a diabetic ulcer that is reversed first ray of his right foot.  He has had a previous amputation to the MTP joint.  He is diabetic and still has poor diabetic control and poor compliance.  At his last visit I was able to unroofed the blister with a scalpel in the office.  Has been treating this with ointment.  Has not been performing soaks but has been on oral antibiotics.  He and his wife said it is much better overall.  On exam the wound is healed up completely.  There is still a buildup of scar tissue and callus but there is no drainage and no redness.  There is no foul odor.  I did counsel him again about diabetic compliance and control.  Since the wound is doing well and is healed over, follow-up can be as needed.  If it worsens she will let us know.  His x-rays at last visit did not show any worsening in terms of evidence of osteomyelitis or irregularities.  I agree that this does look better from his visit 2 weeks ago.

## 2022-09-13 ENCOUNTER — Telehealth: Payer: Self-pay | Admitting: Orthopedic Surgery

## 2022-09-13 NOTE — Telephone Encounter (Signed)
Pt's wife Marcie Bal called stating pt need to be seen right away. She states her husband right foot has an open wound next to amputated toes. Foot is purple colored. She states it doesn't look good. Toe nail is blk. Please call Marcie Bal at [phone].

## 2022-09-13 NOTE — Telephone Encounter (Signed)
He has not seen Dr. Sharol Given since 2022, he has recently seen Dr. Ninfa Linden twice for this same issue. Dr. Sharol Given is not going to be here the rest of this week. I do not have any availability. Okay to put on with Dr. Ninfa Linden or even [facility] schedule as he will be seen sooner.

## 2022-09-27 ENCOUNTER — Ambulatory Visit: Payer: Medicare HMO | Admitting: Physician Assistant

## 2022-09-28 ENCOUNTER — Ambulatory Visit (INDEPENDENT_AMBULATORY_CARE_PROVIDER_SITE_OTHER): Payer: Medicare Other | Admitting: Family

## 2022-09-28 ENCOUNTER — Encounter: Payer: Self-pay | Admitting: Family

## 2022-09-28 ENCOUNTER — Other Ambulatory Visit (INDEPENDENT_AMBULATORY_CARE_PROVIDER_SITE_OTHER): Payer: Medicare Other

## 2022-09-28 DIAGNOSIS — Z9889 Other specified postprocedural states: Secondary | ICD-10-CM

## 2022-09-28 DIAGNOSIS — L97511 Non-pressure chronic ulcer of other part of right foot limited to breakdown of skin: Secondary | ICD-10-CM

## 2022-09-28 MED ORDER — DOXYCYCLINE HYCLATE 100 MG PO TABS: 100.0000 mg | ORAL_TABLET | Freq: Two times a day (BID) | ORAL | 0 refills | Status: DC

## 2022-09-28 MED ORDER — MUPIROCIN 2 % EX KIT: 1.0000 | PACK | Freq: Every day | CUTANEOUS | 2 refills | Status: DC

## 2022-09-28 NOTE — Progress Notes (Signed)
Office Visit Note   Patient: Troy [name]           Date of Birth: 02/12/1961           MRN: [id number] Visit Date: 09/28/2022              Requested by: No referring provider defined for this encounter. PCP: Pcp, No  Chief Complaint  Patient presents with   Right Foot - Ulcer      HPI: The patient is a 62 year old gentleman who presents today for evaluation of ulcer to the right foot.  He is status post great toe amputation with Dr. Magnus [name].  His wife reports he has had an ulcer to the first metatarsal head for about 1 year possibly longer.  They currently are not doing any wound care endorse walking barefoot in the home most days.  He is also status post left above-knee amputation  Presents today for concern of increased redness and pain which is extending into the lower leg  Assessment & Plan: Visit Diagnoses:  1. Right foot ulcer, limited to breakdown of skin     Plan: Recommended packing the wound open with mupirocin and gauze.  Will place on a 2-week course of doxycycline.  Discussed concern for osteomyelitis lightest with the patient.  Right now he prefers to proceed with conservative measures he will follow-up with Dr. Magnus [name] for his request in 2 weeks  Follow-Up Instructions: No follow-ups on file.   Ortho Exam  Patient is alert, oriented, no adenopathy, well-dressed, normal affect, normal respiratory effort. Examination of the right foot Wagner grade 1 ulcer beneath the first metatarsal head this is 2 cm in diameter.  After debridement central ulceration 1 cm in diameter with 5 mm of depth.  This probes to fascia.  There is no active drainage mild surrounding maceration no odor  Imaging: XR Foot Complete Right  Result Date: 09/28/2022 Radiographs of the right foot without definitive sign of osteomyelitis.  The distal first metatarsal is relatively unchanged from last viewing about 1 year ago.  No acute finding.  No images are attached to the  encounter.  Labs: Lab Results  Component Value Date   HGBA1C 10.8 (H) 08/18/2021   HGBA1C 12.3 (H) 01/21/2021   HGBA1C 11.0 (H) 12/13/2017   CRP 22.7 (H) 08/20/2021   CRP 23.2 (H) 08/20/2021   CRP 22.1 (H) 08/19/2021   REPTSTATUS 05/03/2022 FINAL 05/01/2022   GRAMSTAIN NO WBC SEEN NO ORGANISMS SEEN  08/20/2021   CULT  05/01/2022    NO GROWTH Performed at [facility], [address] [place], [place] [address]    Imelda PillowLABORGA ENTEROCOCCUS FAECALIS (A) 12/12/2021     Lab Results  Component Value Date   ALBUMIN 3.1 (L) 12/12/2021   ALBUMIN 1.8 (L) 08/26/2021   ALBUMIN 1.6 (L) 08/22/2021    Lab Results  Component Value Date   MG 1.7 08/26/2021   MG 1.5 (L) 08/25/2021   MG 1.9 08/24/2021   No results found for: "VD25OH"  No results found for: "PREALBUMIN"    Latest Ref Rng & Units 05/01/2022   12:17 PM 12/12/2021    8:18 PM 12/12/2021    2:37 PM  CBC EXTENDED  WBC 4.0 - 10.5 K/uL 18.1   14.4   RBC 4.22 - 5.81 MIL/uL 5.48   5.77   Hemoglobin 13.0 - 17.0 g/dL 19.115.5  47.813.6  29.515.6   HCT 39.0 - 52.0 % 44.9  40.0  45.6   Platelets  150 - 400 K/uL 201   262   NEUT# 1.7 - 7.7 K/uL 14.2     Lymph# 0.7 - 4.0 K/uL 3.0        There is no height or weight on file to calculate BMI.  Orders:  Orders Placed This Encounter  Procedures   XR Foot Complete Right   Meds ordered this encounter  Medications   doxycycline (VIBRA-TABS) 100 MG tablet    Sig: Take 1 tablet (100 mg total) by mouth 2 (two) times daily.    Dispense:  60 tablet    Refill:  0     Procedures: No procedures performed  Clinical Data: No additional findings.  ROS:  All other systems negative, except as noted in the HPI. Review of Systems  Objective: Vital Signs: There were no vitals taken for this visit.  Specialty Comments:  No specialty comments available.  PMFS History: Patient Active Problem List   Diagnosis Date Noted   Lower limb ischemia: ??? 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   Ketoacidosis due to type 2 diabetes mellitus 08/15/2021   COVID-19 virus infection 08/15/2021   S/P AKA (above knee amputation), left 02/12/2021   Amputation of right great toe and 2nd Toe 02/12/2021   Sepsis due to left-sided neck cellulitis/MRSA 02/12/2021   Hypotensive episode    Elevated troponin    SIRS (systemic inflammatory response syndrome) 02/11/2021   Abscess of bursa of left ankle 01/21/2021   Dehiscence of amputation stump    Ischemia of left BKA site    Abscess of leg without foot, left    Osteomyelitis of second toe of right foot 07/12/2019   Below knee amputation status, left 01/25/2018   Wound dehiscence, surgical    Status post left foot surgery 12/15/2017   GERD (gastroesophageal reflux disease) 09/26/2017   Hyperlipidemia associated with type 2 diabetes mellitus 07/08/2017   Snoring 06/07/2016   Chest tube in place 01/22/2016   Abnormal nuclear stress test 01/22/2016   Pain, chronic due to trauma 07/04/2012   Complex regional pain syndrome I of lower limb 07/04/2012   COPD (chronic obstructive pulmonary disease) 01/27/2011   Pre-operative cardiovascular examination 01/27/2011   Nonspecific abnormal electrocardiogram (ECG) (EKG) 01/27/2011   Murmur 01/27/2011   DM2 (diabetes mellitus, type 2)    HTN (hypertension)    HLD (hyperlipidemia)    Tobacco abuse    Past Medical History:  Diagnosis Date   Acute renal failure    in setting of NSAID use and orthopedic surgery 2010   Anxiety and depression    Chronic diastolic CHF (congestive heart failure)    a. Echo 6/17: severe conc LVH, vigorous EF, EF 65-70%, no dynamic obstruction, no RWMA, Gr 1 DD, mild TR  //  b. LHC 8/17: no sig CAD, LVEDP 28   Cirrhosis of liver    COPD (chronic obstructive  pulmonary disease)    Diabetic ulcer of left foot    DM2 (diabetes mellitus, type 2)    Dysrhythmia    Family history of early CAD    GERD (gastroesophageal reflux disease)    History of amputation of foot    L trans-met // R toe   History of cardiac catheterization    a. LHC 2002:  irregs  //  b. LHC in 8/17: no sig CAD, apical DK, hyperdynamic LV, LVEDP 28   History of kidney stones    History of nuclear stress test    a. Nuc 7/17: Overall, intermediate risk nuclear stress test secondary to small size of apical lateral defect and reduced ejection fraction.  EF 43%   HLD (hyperlipidemia)    HTN (hypertension)    Hx of BKA, left 01/03/2018   Injuries     crushing injury to both his feet in February 2010.    Kidney calculi    Palpitations    PTSD (post-traumatic stress disorder)    Tobacco abuse     Family History  Problem Relation Age of Onset   Leukemia Mother 25       died   Lung cancer Father 76       died   Heart attack Brother 34   Heart attack Brother 78   Hypertension Brother        X3   Hypertension Sister        X2   Diabetes Sister    Stroke Sister    Diabetes Sister    Other Brother        Set designer accident    Past Surgical History:  Procedure Laterality Date   AMPUTATION Left 01/03/2018   Procedure: LEFT MIDFOOT AMPUTATION/REVISION MIDAMPUTAION;  Surgeon: Kathryne Hitch, MD;  Location: [facility] OR;  Service: Orthopedics;  Laterality: Left;   AMPUTATION Left 01/25/2018   Procedure: LEFT BELOW KNEE AMPUTATION;  Surgeon: Nadara Mustard, MD;  [facility];  Service: Orthopedics;  Laterality: Left;   AMPUTATION Left 01/21/2021   Procedure: LEFT ABOVE KNEE AMPUTATION;  Surgeon: Nadara Mustard, MD;  Location: [facility] OR;  Service: Orthopedics;  Laterality: Left;   AMPUTATION TOE Right 07/17/2019   Procedure: AMPUTATION RIGHT FOOT 2ND TOE;  Surgeon: Kathryne Hitch, MD;  Location: [facility];  Service: Orthopedics;  Laterality: Right;   APPLICATION  OF WOUND VAC Left 01/21/2021   Procedure: APPLICATION OF WOUND VAC;  Surgeon: Nadara Mustard, MD;  Location: [facility] OR;  Service: Orthopedics;  Laterality: Left;   BELOW KNEE LEG AMPUTATION Left 01/25/2018   CARDIAC CATHETERIZATION N/A 01/22/2016   Procedure: Left Heart Cath and Coronary Angiography;  Surgeon: Peter M Swaziland, MD;  [facility] INVASIVE CV LAB;  Service: Cardiovascular;  Laterality: N/A;   FOOT AMPUTATION Bilateral    I & D EXTREMITY Left 12/15/2017   Procedure: IRRIGATION AND DEBRIDEMENT LEFT FOOT ULCER;  Surgeon: Kathryne Hitch, MD;  Location: [facility] ORS;  Service: Orthopedics;  Laterality: Left;   I & D EXTREMITY Left 07/25/2020   Procedure: LEFT BELOW KNEE AMPUTATION ABSCESS EXCISION AND SKIN GRAFT;  Surgeon: Nadara Mustard, MD;  Location: [facility] OR;  Service: Orthopedics;  Laterality: Left;   I & D EXTREMITY Left 08/22/2020   Procedure: DEBRIDEMENT LEFT BELOW KNEE AMPUTATION AND APPLY KERECIS SKIN GRAFT;  Surgeon: Nadara Mustard, MD;  Location: [facility] OR;  Service: Orthopedics;  Laterality: Left;   LITHOTRIPSY     TENDON LENGTHENING Bilateral    calf   TONSILLECTOMY     Social History   Occupational History   Occupation: DISABLED  Tobacco Use   Smoking status: Every Day    Packs/day: 1.00    Years: 40.00    Additional pack years: 0.00    Total pack years: 40.00    Types: Cigarettes   Smokeless tobacco: Never  Vaping  Use   Vaping Use: Never used  Substance and Sexual Activity   Alcohol use: No   Drug use: No   Sexual activity: Not on file

## 2022-10-18 ENCOUNTER — Encounter: Payer: Self-pay | Admitting: Orthopaedic Surgery

## 2022-10-18 ENCOUNTER — Ambulatory Visit: Payer: Medicare Other | Admitting: Orthopaedic Surgery

## 2022-10-18 DIAGNOSIS — L97511 Non-pressure chronic ulcer of other part of right foot limited to breakdown of skin: Secondary | ICD-10-CM

## 2022-10-18 NOTE — Progress Notes (Signed)
The patient is well-known to Troy [name].  He has a chronic right foot ulcer.  He is a poorly controlled diabetic.  His wife with him today as well.  We have pared this ulcer down on occasion and or doing so as well today.  He still is a poorly controlled diabetic.  I did use a #10 blade and unroofed the ulcers around his first ray where he has had a previous MTP first ray amputation.  There was no gross purulence fortunately and no exposed bone.  I did get bleeding tissue.  I did place Bactroban ointment and dry dressings on this wound.  His wife knows to continue to do this on a daily basis.  Will see him back in 4 weeks to see how he is doing overall.  Office if there is issues they know to let Troy [name] know sooner.

## 2022-10-25 ENCOUNTER — Other Ambulatory Visit: Payer: Self-pay | Admitting: Family

## 2022-11-04 ENCOUNTER — Telehealth: Payer: Self-pay

## 2022-11-04 NOTE — Telephone Encounter (Signed)
[name] called stating that patient's foot is getting worse and would like to know if patient can be seen before 11/16/2022.  CB# [phone].  Please advise.  Thank you.

## 2022-11-05 NOTE — Telephone Encounter (Signed)
Patient scheduled with Troy [name] Tuesday but aware that if his foot looks too bad she needs to go to the ER

## 2022-11-09 ENCOUNTER — Encounter: Payer: Self-pay | Admitting: Family

## 2022-11-09 ENCOUNTER — Ambulatory Visit: Payer: Medicare Other | Admitting: Family

## 2022-11-09 DIAGNOSIS — L98491 Non-pressure chronic ulcer of skin of other sites limited to breakdown of skin: Secondary | ICD-10-CM | POA: Diagnosis not present

## 2022-11-09 DIAGNOSIS — L97511 Non-pressure chronic ulcer of other part of right foot limited to breakdown of skin: Secondary | ICD-10-CM | POA: Diagnosis not present

## 2022-11-09 NOTE — Progress Notes (Signed)
Office Visit Note   Patient: Troy [name]           Date of Birth: Jul 09, 1960           MRN: [id number] Visit Date: 11/09/2022              Requested by: No referring provider defined for this encounter. PCP: Pcp, No  No chief complaint on file.     HPI: The patient is a 62 year old gentleman who is seen today for worsening of ulcers to his right foot.  He was last seen by Dr. Magnus Ivan April 29 for Wagner grade 1 ulcer to the right foot beneath the first metatarsal head they are concerned about some darkened discoloration and worsening to the medial column and over the dorsum of the same area.  Also a new blister to the lateral column.  Denies fevers chills or drainage.  Not currently taking baby aspirin  Assessment & Plan: Visit Diagnoses: No diagnosis found.  Plan: Will place on a course of Bactrim.  Have also called in nitroglycerin patches for the right foot as well as trying Tylenol.  Discussed the ischemic changes.    Follow-Up Instructions: No follow-ups on file.   Ortho Exam  Patient is alert, oriented, no adenopathy, well-dressed, normal affect, normal respiratory effort. On examination of the right foot he has Wagner grade 1 ulcer beneath the first metatarsal head on the right this is stable there is no depth or drainage today no surrounding erythema unfortunately there are 2 new satellite lesions to the medial column over the distal first metatarsal as well as over the dorsum of the same area there is a ischemic ulcer without drainage.  There is no erythema.  Along the lateral column has a fluid-filled blister this was lanced there is serosanguineous drainage this is 2 cm in diameter there is no purulence no erythema  Strong triphasic pulse with doppler  Imaging: No results found. No images are attached to the encounter.  Labs: Lab Results  Component Value Date   HGBA1C 10.8 (H) 08/18/2021   HGBA1C 12.3 (H) 01/21/2021   HGBA1C 11.0 (H) 12/13/2017   CRP 22.7  (H) 08/20/2021   CRP 23.2 (H) 08/20/2021   CRP 22.1 (H) 08/19/2021   REPTSTATUS 05/03/2022 FINAL 05/01/2022   GRAMSTAIN NO WBC SEEN NO ORGANISMS SEEN  08/20/2021   CULT  05/01/2022    NO GROWTH Performed at [facility], [address], [place], [place] [address]    Imelda Pillow ENTEROCOCCUS FAECALIS (A) 12/12/2021     Lab Results  Component Value Date   ALBUMIN 3.1 (L) 12/12/2021   ALBUMIN 1.8 (L) 08/26/2021   ALBUMIN 1.6 (L) 08/22/2021    Lab Results  Component Value Date   MG 1.7 08/26/2021   MG 1.5 (L) 08/25/2021   MG 1.9 08/24/2021   No results found for: "VD25OH"  No results found for: "PREALBUMIN"    Latest Ref Rng & Units 05/01/2022   12:17 PM 12/12/2021    8:18 PM 12/12/2021    2:37 PM  CBC EXTENDED  WBC 4.0 - 10.5 K/uL 18.1   14.4   RBC 4.22 - 5.81 MIL/uL 5.48   5.77   Hemoglobin 13.0 - 17.0 g/dL 21.3  08.6  57.8   HCT 39.0 - 52.0 % 44.9  40.0  45.6   Platelets 150 - 400 K/uL 201   262   NEUT# 1.7 - 7.7 K/uL 14.2     Lymph# 0.7 - 4.0  K/uL 3.0        There is no height or weight on file to calculate BMI.  Orders:  No orders of the defined types were placed in this encounter.  No orders of the defined types were placed in this encounter.    Procedures: No procedures performed  Clinical Data: No additional findings.  ROS:  All other systems negative, except as noted in the HPI. Review of Systems  Objective: Vital Signs: There were no vitals taken for this visit.  Specialty Comments:  No specialty comments available.  PMFS History: Patient Active Problem List   Diagnosis Date Noted   Lower limb ischemia: ??? 08/22/2021   Diabetic acidosis without coma (HCC)    Hypokalemia    Hypomagnesemia    Hyponatremia    Pneumonia due to infectious organism    Parapneumonic effusion 08/18/2021   Bowel Ileus (HCC) 08/18/2021   ?? 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   Ketoacidosis due to type 2 diabetes mellitus (HCC) 08/15/2021   COVID-19  virus infection 08/15/2021   S/P AKA (above knee amputation), left (HCC) 02/12/2021   Amputation of right great toe and 2nd Toe 02/12/2021   Sepsis due to left-sided neck cellulitis/MRSA 02/12/2021   Hypotensive episode    Elevated troponin    SIRS (systemic inflammatory response syndrome) (HCC) 02/11/2021   Abscess of bursa of left ankle 01/21/2021   Dehiscence of amputation stump (HCC)    Ischemia of left BKA site (HCC)    Abscess of leg without foot, left    Osteomyelitis of second toe of right foot (HCC) 07/12/2019   Below knee amputation status, left 01/25/2018   Wound dehiscence, surgical    Status post left foot surgery 12/15/2017   GERD (gastroesophageal reflux disease) 09/26/2017   Hyperlipidemia associated with type 2 diabetes mellitus (HCC) 07/08/2017   Snoring 06/07/2016   Chest tube in place 01/22/2016   Abnormal nuclear stress test 01/22/2016   Pain, chronic due to trauma 07/04/2012   Complex regional pain syndrome I of lower limb 07/04/2012   COPD (chronic obstructive pulmonary disease) (HCC) 01/27/2011   Pre-operative cardiovascular examination 01/27/2011   Nonspecific abnormal electrocardiogram (ECG) (EKG) 01/27/2011   Murmur 01/27/2011   DM2 (diabetes mellitus, type 2) (HCC)    HTN (hypertension)    HLD (hyperlipidemia)    Tobacco abuse    Past Medical History:  Diagnosis Date   Acute renal failure (HCC)    in setting of NSAID use and orthopedic surgery 2010   Anxiety and depression    Chronic diastolic CHF (congestive heart failure) (HCC)    a. Echo 6/17: severe conc LVH, vigorous EF, EF 65-70%, no dynamic obstruction, no RWMA, Gr 1 DD, mild TR  //  b. LHC 8/17: no sig CAD, LVEDP 28   Cirrhosis of liver (HCC)    COPD (chronic obstructive pulmonary disease) (HCC)    Diabetic ulcer of left foot (HCC)    DM2 (diabetes mellitus, type 2) (HCC)    Dysrhythmia    Family history of early CAD    GERD (gastroesophageal reflux disease)    History of amputation of  foot (HCC)    L trans-met // R toe   History of cardiac catheterization    a. LHC 2002: irregs  //  b. LHC in 8/17: no sig CAD, apical DK, hyperdynamic LV, LVEDP 28   History of kidney stones    History of nuclear stress test    a. Nuc 7/17: Overall, intermediate risk nuclear  stress test secondary to small size of apical lateral defect and reduced ejection fraction.  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    Kidney calculi    Palpitations    PTSD (post-traumatic stress disorder)    Tobacco abuse     Family History  Problem Relation Age of Onset   Leukemia Mother 52       died   Lung cancer Father 69       died   Heart attack Brother 75   Heart attack Brother 37   Hypertension Brother        X3   Hypertension Sister        X2   Diabetes Sister    Stroke Sister    Diabetes Sister    Other Brother        Set designer accident    Past Surgical History:  Procedure Laterality Date   AMPUTATION Left 01/03/2018   Procedure: LEFT MIDFOOT AMPUTATION/REVISION MIDAMPUTAION;  Surgeon: Kathryne Hitch, MD;  Location: [facility] OR;  Service: Orthopedics;  Laterality: Left;   AMPUTATION Left 01/25/2018   Procedure: LEFT BELOW KNEE AMPUTATION;  Surgeon: Nadara Mustard, MD;  Location: [facility] OR;  Service: Orthopedics;  Laterality: Left;   AMPUTATION Left 01/21/2021   Procedure: LEFT ABOVE KNEE AMPUTATION;  Surgeon: Nadara Mustard, MD;  Location: [facility] OR;  Service: Orthopedics;  Laterality: Left;   AMPUTATION TOE Right 07/17/2019   Procedure: AMPUTATION RIGHT FOOT 2ND TOE;  Surgeon: Kathryne Hitch, MD;  Location: [facility];  Service: Orthopedics;  Laterality: Right;   APPLICATION OF WOUND VAC Left 01/21/2021   Procedure: APPLICATION OF WOUND VAC;  Surgeon: Nadara Mustard, MD;  Location: [facility] OR;  Service: Orthopedics;  Laterality: Left;   BELOW KNEE LEG AMPUTATION Left  01/25/2018   CARDIAC CATHETERIZATION N/A 01/22/2016   Procedure: Left Heart Cath and Coronary Angiography;  Surgeon: Peter M Swaziland, MD;  Location: [facility] INVASIVE CV LAB;  Service: Cardiovascular;  Laterality: N/A;   FOOT AMPUTATION Bilateral    I & D EXTREMITY Left 12/15/2017   Procedure: IRRIGATION AND DEBRIDEMENT LEFT FOOT ULCER;  Surgeon: Kathryne Hitch, MD;  Location: [facility];  Service: Orthopedics;  Laterality: Left;   I & D EXTREMITY Left 07/25/2020   Procedure: LEFT BELOW KNEE AMPUTATION ABSCESS EXCISION AND SKIN GRAFT;  Surgeon: Nadara Mustard, MD;  Location: [facility] OR;  Service: Orthopedics;  Laterality: Left;   I & D EXTREMITY Left 08/22/2020   Procedure: DEBRIDEMENT LEFT BELOW KNEE AMPUTATION AND APPLY KERECIS SKIN GRAFT;  Surgeon: Nadara Mustard, MD;  Location: [facility] OR;  Service: Orthopedics;  Laterality: Left;   LITHOTRIPSY     TENDON LENGTHENING Bilateral    calf   TONSILLECTOMY     Social History   Occupational History   Occupation: DISABLED  Tobacco Use   Smoking status: Every Day    Packs/day: 1.00    Years: 40.00    Additional pack years: 0.00    Total pack years: 40.00    Types: Cigarettes   Smokeless tobacco: Never  Vaping Use   Vaping Use: Never used  Substance and Sexual Activity   Alcohol use: No   Drug use: No   Sexual activity: Not on file

## 2022-11-16 ENCOUNTER — Ambulatory Visit: Payer: Medicare Other | Admitting: Orthopaedic Surgery

## 2022-11-17 ENCOUNTER — Telehealth: Payer: Self-pay | Admitting: Orthopedic Surgery

## 2022-11-17 MED ORDER — NITROGLYCERIN 0.2 MG/HR TD PT24: 0.2000 mg | MEDICATED_PATCH | Freq: Every day | TRANSDERMAL | 12 refills | Status: DC

## 2022-11-17 MED ORDER — SULFAMETHOXAZOLE-TRIMETHOPRIM 800-160 MG PO TABS: 1.0000 | ORAL_TABLET | Freq: Two times a day (BID) | ORAL | 0 refills | Status: DC

## 2022-11-17 NOTE — Telephone Encounter (Signed)
Bactrim and nitro patches. Please advise.

## 2022-11-17 NOTE — Telephone Encounter (Signed)
Pt's wife [name] states pt seen PA Erin and antibiotics and patches for foot was suppose to be sent to [facility] in [place]. Please send scripts. Pt phone number is [phone]

## 2022-11-29 ENCOUNTER — Ambulatory Visit: Payer: Medicare Other | Admitting: Orthopedic Surgery

## 2022-11-29 ENCOUNTER — Ambulatory Visit: Payer: Medicare Other | Admitting: Physician Assistant

## 2022-11-29 DIAGNOSIS — L97511 Non-pressure chronic ulcer of other part of right foot limited to breakdown of skin: Secondary | ICD-10-CM | POA: Diagnosis not present

## 2022-12-06 ENCOUNTER — Encounter: Payer: Self-pay | Admitting: Orthopedic Surgery

## 2022-12-06 NOTE — Progress Notes (Signed)
Office Visit Note   Patient: Troy [name]           Date of Birth: 1960-06-30           MRN: [id number] Visit Date: 11/29/2022              Requested by: No referring provider defined for this encounter. PCP: Pcp, No  Chief Complaint  Patient presents with   Right Foot - Follow-up      HPI: Patient is a 62 year old gentleman who is seen in follow-up for ulceration right foot.  Patient is not using the antibiotic Bactrim DS.  He did not get the nitroglycerin patch he did not feel this would work.  No recent hemoglobin A1c.  Assessment & Plan: Visit Diagnoses:  1. Right foot ulcer, limited to breakdown of skin (HCC)     Plan: Recommended continue to use the Bactroban cream dressing changes daily reevaluate in 4 weeks.  Follow-Up Instructions: Return in about 4 weeks (around 12/27/2022).   Ortho Exam  Patient is alert, oriented, no adenopathy, well-dressed, normal affect, normal respiratory effort. Examination Doppler was used he has a strong monophasic dorsalis pedis and posterior tibial pulse.  Patient has poor microcirculation.  He has ischemic ulcer over the fifth metatarsal head laterally that is 3 cm in diameter.  He has ischemic ulcer over the first metatarsal head medially that is 2 cm in diameter.  Imaging: No results found. No images are attached to the encounter.  Labs: Lab Results  Component Value Date   HGBA1C 10.8 (H) 08/18/2021   HGBA1C 12.3 (H) 01/21/2021   HGBA1C 11.0 (H) 12/13/2017   CRP 22.7 (H) 08/20/2021   CRP 23.2 (H) 08/20/2021   CRP 22.1 (H) 08/19/2021   REPTSTATUS 05/03/2022 FINAL 05/01/2022   GRAMSTAIN NO WBC SEEN NO ORGANISMS SEEN  08/20/2021   CULT  05/01/2022    NO GROWTH Performed at [facility], [address] [place], [place] [address]    Imelda Pillow ENTEROCOCCUS FAECALIS (A) 12/12/2021     Lab Results  Component Value Date   ALBUMIN 3.1 (L) 12/12/2021   ALBUMIN 1.8 (L) 08/26/2021   ALBUMIN 1.6 (L) 08/22/2021    Lab  Results  Component Value Date   MG 1.7 08/26/2021   MG 1.5 (L) 08/25/2021   MG 1.9 08/24/2021   No results found for: "VD25OH"  No results found for: "PREALBUMIN"    Latest Ref Rng & Units 05/01/2022   12:17 PM 12/12/2021    8:18 PM 12/12/2021    2:37 PM  CBC EXTENDED  WBC 4.0 - 10.5 K/uL 18.1   14.4   RBC 4.22 - 5.81 MIL/uL 5.48   5.77   Hemoglobin 13.0 - 17.0 g/dL 46.9  62.9  52.8   HCT 39.0 - 52.0 % 44.9  40.0  45.6   Platelets 150 - 400 K/uL 201   262   NEUT# 1.7 - 7.7 K/uL 14.2     Lymph# 0.7 - 4.0 K/uL 3.0        There is no height or weight on file to calculate BMI.  Orders:  No orders of the defined types were placed in this encounter.  No orders of the defined types were placed in this encounter.    Procedures: No procedures performed  Clinical Data: No additional findings.  ROS:  All other systems negative, except as noted in the HPI. Review of Systems  Objective: Vital Signs: There were no vitals taken for this  visit.  Specialty Comments:  No specialty comments available.  PMFS History: Patient Active Problem List   Diagnosis Date Noted   Lower limb ischemia: ??? 08/22/2021   Diabetic acidosis without coma (HCC)    Hypokalemia    Hypomagnesemia    Hyponatremia    Pneumonia due to infectious organism    Parapneumonic effusion 08/18/2021   Bowel Ileus (HCC) 08/18/2021   ?? 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   Ketoacidosis due to type 2 diabetes mellitus (HCC) 08/15/2021   COVID-19 virus infection 08/15/2021   S/P AKA (above knee amputation), left (HCC) 02/12/2021   Amputation of right great toe and 2nd Toe 02/12/2021   Sepsis due to left-sided neck cellulitis/MRSA 02/12/2021   Hypotensive episode    Elevated troponin    SIRS (systemic inflammatory response syndrome) (HCC) 02/11/2021   Abscess of bursa of left ankle 01/21/2021   Dehiscence of amputation stump (HCC)    Ischemia of left BKA site (HCC)    Abscess of leg without foot, left     Osteomyelitis of second toe of right foot (HCC) 07/12/2019   Below knee amputation status, left 01/25/2018   Wound dehiscence, surgical    Status post left foot surgery 12/15/2017   GERD (gastroesophageal reflux disease) 09/26/2017   Hyperlipidemia associated with type 2 diabetes mellitus (HCC) 07/08/2017   Snoring 06/07/2016   Chest tube in place 01/22/2016   Abnormal nuclear stress test 01/22/2016   Pain, chronic due to trauma 07/04/2012   Complex regional pain syndrome I of lower limb 07/04/2012   COPD (chronic obstructive pulmonary disease) (HCC) 01/27/2011   Pre-operative cardiovascular examination 01/27/2011   Nonspecific abnormal electrocardiogram (ECG) (EKG) 01/27/2011   Murmur 01/27/2011   DM2 (diabetes mellitus, type 2) (HCC)    HTN (hypertension)    HLD (hyperlipidemia)    Tobacco abuse    Past Medical History:  Diagnosis Date   Acute renal failure (HCC)    in setting of NSAID use and orthopedic surgery 2010   Anxiety and depression    Chronic diastolic CHF (congestive heart failure) (HCC)    a. Echo 6/17: severe conc LVH, vigorous EF, EF 65-70%, no dynamic obstruction, no RWMA, Gr 1 DD, mild TR  //  b. 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 LHC 2002: irregs  //  b. LHC in 8/17: no sig CAD, apical DK, hyperdynamic LV, LVEDP 28   History of kidney stones    History of nuclear stress test    a. Nuc 7/17: Overall, intermediate risk nuclear stress test secondary to small size of apical lateral defect and reduced ejection fraction.  EF 43%   HLD (hyperlipidemia)    HTN (hypertension)    Hx of BKA, left (HCC) 01/03/2018    Injuries     crushing injury to both his feet in February 2010.    Kidney calculi    Palpitations    PTSD (post-traumatic stress disorder)    Tobacco abuse     Family History  Problem Relation Age of Onset  Leukemia Mother 65       died   Lung cancer Father 75       died   Heart attack Brother 73   Heart attack Brother 19   Hypertension Brother        X3   Hypertension Sister        X2   Diabetes Sister    Stroke Sister    Diabetes Sister    Other Brother        Set designer accident    Past Surgical History:  Procedure Laterality Date   AMPUTATION Left 01/03/2018   Procedure: LEFT MIDFOOT AMPUTATION/REVISION MIDAMPUTAION;  Surgeon: Kathryne Hitch, MD;  Location: [facility] OR;  Service: Orthopedics;  Laterality: Left;   AMPUTATION Left 01/25/2018   Procedure: LEFT BELOW KNEE AMPUTATION;  Surgeon: Nadara Mustard, MD;  Location[facility] OR;  Service: Orthopedics;  Laterality: Left;   AMPUTATION Left 01/21/2021   Procedure: LEFT ABOVE KNEE AMPUTATION;  Surgeon: Nadara Mustard, MD;  Location: [facility] OR;  Service: Orthopedics;  Laterality: Left;   AMPUTATION TOE Right 07/17/2019   Procedure: AMPUTATION RIGHT FOOT 2ND TOE;  Surgeon: Kathryne Hitch, MD;  Location: [facility];  Service: Orthopedics;  Laterality: Right;   APPLICATION OF WOUND VAC Left 01/21/2021   Procedure: APPLICATION OF WOUND VAC;  Surgeon: Nadara Mustard, MD;  Location: [facility] OR;  Service: Orthopedics;  Laterality: Left;   BELOW KNEE LEG AMPUTATION Left 01/25/2018   CARDIAC CATHETERIZATION N/A 01/22/2016   Procedure: Left Heart Cath and Coronary Angiography;  Surgeon: Peter M Swaziland, MD;  Location: [facility] INVASIVE CV LAB;  Service: Cardiovascular;  Laterality: N/A;   FOOT AMPUTATION Bilateral    I & D EXTREMITY Left 12/15/2017   Procedure: IRRIGATION AND DEBRIDEMENT LEFT FOOT ULCER;  Surgeon: Kathryne Hitch, MD;  Location: [facility];  Service: Orthopedics;  Laterality: Left;   I & D EXTREMITY Left 07/25/2020    Procedure: LEFT BELOW KNEE AMPUTATION ABSCESS EXCISION AND SKIN GRAFT;  Surgeon: Nadara Mustard, MD;  Location: [facility] OR;  Service: Orthopedics;  Laterality: Left;   I & D EXTREMITY Left 08/22/2020   Procedure: DEBRIDEMENT LEFT BELOW KNEE AMPUTATION AND APPLY KERECIS SKIN GRAFT;  Surgeon: Nadara Mustard, MD;  Location: [facility] OR;  Service: Orthopedics;  Laterality: Left;   LITHOTRIPSY     TENDON LENGTHENING Bilateral    calf   TONSILLECTOMY     Social History   Occupational History   Occupation: DISABLED  Tobacco Use   Smoking status: Every Day    Packs/day: 1.00    Years: 40.00    Additional pack years: 0.00    Total pack years: 40.00    Types: Cigarettes   Smokeless tobacco: Never  Vaping Use   Vaping Use: Never used  Substance and Sexual Activity   Alcohol use: No   Drug use: No   Sexual activity: Not on file

## 2022-12-13 ENCOUNTER — Encounter: Payer: Self-pay | Admitting: Orthopedic Surgery

## 2022-12-13 ENCOUNTER — Telehealth: Payer: Self-pay | Admitting: Orthopedic Surgery

## 2022-12-13 ENCOUNTER — Ambulatory Visit: Payer: Medicare Other | Admitting: Orthopedic Surgery

## 2022-12-13 DIAGNOSIS — Z89612 Acquired absence of left leg above knee: Secondary | ICD-10-CM

## 2022-12-13 DIAGNOSIS — L97511 Non-pressure chronic ulcer of other part of right foot limited to breakdown of skin: Secondary | ICD-10-CM | POA: Diagnosis not present

## 2022-12-13 DIAGNOSIS — L98491 Non-pressure chronic ulcer of skin of other sites limited to breakdown of skin: Secondary | ICD-10-CM

## 2022-12-13 NOTE — Telephone Encounter (Signed)
Can you please call and make an appt for this afternoon. There is an open spot

## 2022-12-13 NOTE — Progress Notes (Signed)
Office Visit Note   Patient: Troy [name]           Date of Birth: [birth year]           MRN: [id number] Visit Date: 12/13/2022              Requested by: No referring provider defined for this encounter. PCP: Pcp, No  Chief Complaint  Patient presents with   Right Foot - Pain      HPI: Patient is a 62 year old gentleman who presents with acute ischemic ulcers first metatarsal head and fifth metatarsal head right foot.  Patient is status post left above-the-knee amputation in August 2022 patient is status post ankle-brachial indices in March 2023.  Assessment & Plan: Visit Diagnoses:  1. Ischemic ulcer, limited to breakdown of skin (HCC)   2. Right foot ulcer, limited to breakdown of skin (HCC)   3. S/P AKA (above knee amputation), left (HCC)     Plan: Patient with acute ischemic ulcers right foot with previous triphasic flow now monophasic.  Referral made to vascular vein surgery for evaluation.  discussed the importance of smoking cessation.  Follow-Up Instructions: No follow-ups on file.   Ortho Exam  Patient is alert, oriented, no adenopathy, well-dressed, normal affect, normal respiratory effort. Examination patient has an acute ulcer over the first metatarsal head.  This probes down to fascia.  There is no ascending cellulitis.  Patient has a black gangrenous ulcer over the lateral border of the fifth metatarsal head which is 1 x 3 cm.  The Doppler was used and patient has a dampened monophasic dorsalis pedis and posterior tibial pulse.  Hemoglobin A1c has consistently been greater than 10 no recent values in the past year.  Imaging: No results found. No images are attached to the encounter.  Labs: Lab Results  Component Value Date   HGBA1C 10.8 (H) 08/18/2021   HGBA1C 12.3 (H) 01/21/2021   HGBA1C 11.0 (H) 12/13/2017   CRP 22.7 (H) 08/20/2021   CRP 23.2 (H) 08/20/2021   CRP 22.1 (H) 08/19/2021   REPTSTATUS 05/03/2022 FINAL 05/01/2022   GRAMSTAIN NO WBC  SEEN NO ORGANISMS SEEN  08/20/2021   CULT  05/01/2022    NO GROWTH Performed at [facility], [address], [place], [place] [address]    Imelda Pillow ENTEROCOCCUS FAECALIS (A) 12/12/2021     Lab Results  Component Value Date   ALBUMIN 3.1 (L) 12/12/2021   ALBUMIN 1.8 (L) 08/26/2021   ALBUMIN 1.6 (L) 08/22/2021    Lab Results  Component Value Date   MG 1.7 08/26/2021   MG 1.5 (L) 08/25/2021   MG 1.9 08/24/2021   No results found for: "VD25OH"  No results found for: "PREALBUMIN"    Latest Ref Rng & Units 05/01/2022   12:17 PM 12/12/2021    8:18 PM 12/12/2021    2:37 PM  CBC EXTENDED  WBC 4.0 - 10.5 K/uL 18.1   14.4   RBC 4.22 - 5.81 MIL/uL 5.48   5.77   Hemoglobin 13.0 - 17.0 g/dL 66.4  40.3  47.4   HCT 39.0 - 52.0 % 44.9  40.0  45.6   Platelets 150 - 400 K/uL 201   262   NEUT# 1.7 - 7.7 K/uL 14.2     Lymph# 0.7 - 4.0 K/uL 3.0        There is no height or weight on file to calculate BMI.  Orders:  Orders Placed This Encounter  Procedures   Ambulatory  referral to Vascular Surgery   No orders of the defined types were placed in this encounter.    Procedures: No procedures performed  Clinical Data: No additional findings.  ROS:  All other systems negative, except as noted in the HPI. Review of Systems  Objective: Vital Signs: There were no vitals taken for this visit.  Specialty Comments:  No specialty comments available.  PMFS History: Patient Active Problem List   Diagnosis Date Noted   Lower limb ischemia: ??? 08/22/2021   Diabetic acidosis without coma (HCC)    Hypokalemia    Hypomagnesemia    Hyponatremia    Pneumonia due to infectious organism    Parapneumonic effusion 08/18/2021   Bowel Ileus (HCC) 08/18/2021   ?? 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   Ketoacidosis due to type 2 diabetes mellitus (HCC) 08/15/2021   COVID-19 virus infection 08/15/2021   S/P AKA (above knee amputation), left (HCC) 02/12/2021   Amputation of right  great toe and 2nd Toe 02/12/2021   Sepsis due to left-sided neck cellulitis/MRSA 02/12/2021   Hypotensive episode    Elevated troponin    SIRS (systemic inflammatory response syndrome) (HCC) 02/11/2021   Abscess of bursa of left ankle 01/21/2021   Dehiscence of amputation stump (HCC)    Ischemia of left BKA site (HCC)    Abscess of leg without foot, left    Osteomyelitis of second toe of right foot (HCC) 07/12/2019   Below knee amputation status, left 01/25/2018   Wound dehiscence, surgical    Status post left foot surgery 12/15/2017   GERD (gastroesophageal reflux disease) 09/26/2017   Hyperlipidemia associated with type 2 diabetes mellitus (HCC) 07/08/2017   Snoring 06/07/2016   Chest tube in place 01/22/2016   Abnormal nuclear stress test 01/22/2016   Pain, chronic due to trauma 07/04/2012   Complex regional pain syndrome I of lower limb 07/04/2012   COPD (chronic obstructive pulmonary disease) (HCC) 01/27/2011   Pre-operative cardiovascular examination 01/27/2011   Nonspecific abnormal electrocardiogram (ECG) (EKG) 01/27/2011   Murmur 01/27/2011   DM2 (diabetes mellitus, type 2) (HCC)    HTN (hypertension)    HLD (hyperlipidemia)    Tobacco abuse    Past Medical History:  Diagnosis Date   Acute renal failure (HCC)    in setting of NSAID use and orthopedic surgery 2010   Anxiety and depression    Chronic diastolic CHF (congestive heart failure) (HCC)    a. Echo 6/17: severe conc LVH, vigorous EF, EF 65-70%, no dynamic obstruction, no RWMA, Gr 1 DD, mild TR  //  b. 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 LHC 2002: irregs  //  b.  LHC in 8/17: no sig CAD, apical DK, hyperdynamic LV, LVEDP 28   History of kidney stones    History of nuclear stress test    a. Nuc 7/17: Overall, intermediate risk nuclear stress test secondary to small size of apical lateral defect and reduced ejection fraction.  EF 43%   HLD (hyperlipidemia)    HTN (hypertension)  Hx of BKA, left (HCC) 01/03/2018   Injuries     crushing injury to both his feet in February 2010.    Kidney calculi    Palpitations    PTSD (post-traumatic stress disorder)    Tobacco abuse     Family History  Problem Relation Age of Onset   Leukemia Mother 90       died   Lung cancer Father 64       died   Heart attack Brother 96   Heart attack Brother 58   Hypertension Brother        X3   Hypertension Sister        X2   Diabetes Sister    Stroke Sister    Diabetes Sister    Other Brother        Set designer accident    Past Surgical History:  Procedure Laterality Date   AMPUTATION Left 01/03/2018   Procedure: LEFT MIDFOOT AMPUTATION/REVISION MIDAMPUTAION;  Surgeon: Kathryne Hitch, MD;  Location: [facility] OR;  Service: Orthopedics;  Laterality: Left;   AMPUTATION Left 01/25/2018   Procedure: LEFT BELOW KNEE AMPUTATION;  Surgeon: Nadara Mustard, MD;  Location: [facility] OR;  Service: Orthopedics;  Laterality: Left;   AMPUTATION Left 01/21/2021   Procedure: LEFT ABOVE KNEE AMPUTATION;  Surgeon: Nadara Mustard, MD;  Location: [facility] OR;  Service: Orthopedics;  Laterality: Left;   AMPUTATION TOE Right 07/17/2019   Procedure: AMPUTATION RIGHT FOOT 2ND TOE;  Surgeon: Kathryne Hitch, MD;  Location: [facility];  Service: Orthopedics;  Laterality: Right;   APPLICATION OF WOUND VAC Left 01/21/2021   Procedure: APPLICATION OF WOUND VAC;  Surgeon: Nadara Mustard, MD;  Location: [facility] OR;  Service: Orthopedics;  Laterality: Left;   BELOW KNEE LEG AMPUTATION Left 01/25/2018   CARDIAC CATHETERIZATION N/A 01/22/2016   Procedure: Left Heart Cath and Coronary Angiography;   Surgeon: Peter M Swaziland, MD;  Location: [facility] INVASIVE CV LAB;  Service: Cardiovascular;  Laterality: N/A;   FOOT AMPUTATION Bilateral    I & D EXTREMITY Left 12/15/2017   Procedure: IRRIGATION AND DEBRIDEMENT LEFT FOOT ULCER;  Surgeon: Kathryne Hitch, MD;  Location: [facility] ORS;  Service: Orthopedics;  Laterality: Left;   I & D EXTREMITY Left 07/25/2020   Procedure: LEFT BELOW KNEE AMPUTATION ABSCESS EXCISION AND SKIN GRAFT;  Surgeon: Nadara Mustard, MD;  Location: [facility] OR;  Service: Orthopedics;  Laterality: Left;   I & D EXTREMITY Left 08/22/2020   Procedure: DEBRIDEMENT LEFT BELOW KNEE AMPUTATION AND APPLY KERECIS SKIN GRAFT;  Surgeon: Nadara Mustard, MD;  Location: [facility] OR;  Service: Orthopedics;  Laterality: Left;   LITHOTRIPSY     TENDON LENGTHENING Bilateral    calf   TONSILLECTOMY     Social History   Occupational History   Occupation: DISABLED  Tobacco Use   Smoking status: Every Day    Packs/day: 1.00    Years: 40.00    Additional pack years: 0.00    Total pack years: 40.00    Types: Cigarettes   Smokeless tobacco: Never  Vaping Use   Vaping Use: Never used  Substance and Sexual Activity   Alcohol use: No   Drug use: No   Sexual activity: Not on file

## 2022-12-13 NOTE — Telephone Encounter (Signed)
Pt's wife [name] called stating the pt right foot has gotten worse. Please call pt at [phone]

## 2022-12-27 ENCOUNTER — Ambulatory Visit: Payer: Medicare Other | Admitting: Orthopedic Surgery

## 2022-12-27 ENCOUNTER — Other Ambulatory Visit: Payer: Self-pay | Admitting: *Deleted

## 2022-12-27 DIAGNOSIS — I998 Other disorder of circulatory system: Secondary | ICD-10-CM

## 2023-01-12 ENCOUNTER — Encounter: Payer: Self-pay | Admitting: Vascular Surgery

## 2023-01-12 ENCOUNTER — Ambulatory Visit: Payer: Medicare Other | Admitting: Vascular Surgery

## 2023-01-12 ENCOUNTER — Ambulatory Visit (HOSPITAL_COMMUNITY)
Admission: RE | Admit: 2023-01-12 | Discharge: 2023-01-12 | Disposition: A | Discharge status: Home / Self Care | Payer: Medicare Other | Source: Ambulatory Visit | Attending: Vascular Surgery | Admitting: Vascular Surgery

## 2023-01-12 VITALS — BP 94/71 | HR 94 | Temp 98.6°F | Resp 20 | Ht 72.0 in | Wt 172.0 lb

## 2023-01-12 DIAGNOSIS — I998 Other disorder of circulatory system: Secondary | ICD-10-CM | POA: Insufficient documentation

## 2023-01-12 DIAGNOSIS — I7025 Atherosclerosis of native arteries of other extremities with ulceration: Secondary | ICD-10-CM

## 2023-01-12 LAB — VAS US ABI WITH/WO TBI
Left ABI: 1.04
Right ABI: 0.57

## 2023-01-12 NOTE — H&P (View-Only) (Signed)
 Patient ID: Troy [name], male   DOB: 02-07-1961, 62 y.o.   MRN: [id number]  Reason for Consult: New Patient (Initial Visit)   Referred by Nadara Mustard, MD  Subjective:     HPI:  Troy [name] is a 61 y.o. 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  Has ulceration on the right foot with dressing in place today and he is followed by Dr. Lajoyce Corners.  He is sent here for vascular evaluation.  Patient does not wear prosthesis on the left lower extremity as such is wheelchair-bound but does use his right lower extremity for pivoting.  He is a current everyday smoker has been prescribed aspirin but does not take.  Past Medical History:  Diagnosis Date   Acute renal failure (HCC)    in setting of NSAID use and orthopedic surgery 2010   Anxiety and depression    Chronic diastolic CHF (congestive heart failure) (HCC)    a. Echo 6/17: severe conc LVH, vigorous EF, EF 65-70%, no dynamic obstruction, no RWMA, Gr 1 DD, mild TR  //  b. 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 LHC 2002: irregs  //  b. LHC in 8/17: no sig CAD, apical DK, hyperdynamic LV, LVEDP 28   History of kidney stones    History of nuclear stress test    a. Nuc 7/17: Overall, intermediate risk nuclear stress test secondary to small size of apical lateral defect and reduced ejection fraction.  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    Kidney calculi    Palpitations    PTSD (post-traumatic stress disorder)    Tobacco abuse    Family  History  Problem Relation Age of Onset   Leukemia Mother 70       died   Lung cancer Father 60       died   Heart attack Brother 65   Heart attack Brother 46   Hypertension Brother        X3   Hypertension Sister        X2   Diabetes Sister    Stroke Sister    Diabetes Sister    Other Brother        Set designer accident   Past Surgical History:  Procedure Laterality Date   AMPUTATION Left 01/03/2018   Procedure: LEFT MIDFOOT AMPUTATION/REVISION MIDAMPUTAION;  Surgeon: Kathryne Hitch, MD;  Location: [facility] OR;  Service: Orthopedics;  Laterality: Left;   AMPUTATION Left 01/25/2018   Procedure: LEFT BELOW KNEE AMPUTATION;  Surgeon: Nadara Mustard, MD;  Location: [facility] OR;  Service: Orthopedics;  Laterality: Left;   AMPUTATION Left 01/21/2021   Procedure: LEFT ABOVE KNEE AMPUTATION;  Surgeon: Nadara Mustard, MD;  Location: [facility] OR;  Service: Orthopedics;  Laterality: Left;   AMPUTATION TOE Right 07/17/2019   Procedure: AMPUTATION RIGHT FOOT  2ND TOE;  Surgeon: Kathryne Hitch, MD;  Location: [facility];  Service: Orthopedics;  Laterality: Right;   APPLICATION OF WOUND VAC Left 01/21/2021   Procedure: APPLICATION OF WOUND VAC;  Surgeon: Nadara Mustard, MD;  Location: [facility] OR;  Service: Orthopedics;  Laterality: Left;   BELOW KNEE LEG AMPUTATION Left 01/25/2018   CARDIAC CATHETERIZATION N/A 01/22/2016   Procedure: Left Heart Cath and Coronary Angiography;  Surgeon: Peter M Swaziland, MD;  Location: [facility] INVASIVE CV LAB;  Service: Cardiovascular;  Laterality: N/A;   FOOT AMPUTATION Bilateral    I & D EXTREMITY Left 12/15/2017   Procedure: IRRIGATION AND DEBRIDEMENT LEFT FOOT ULCER;  Surgeon: Kathryne Hitch, MD;  Location: [facility];  Service: Orthopedics;  Laterality: Left;   I & D EXTREMITY Left 07/25/2020   Procedure: LEFT BELOW KNEE AMPUTATION ABSCESS EXCISION AND SKIN GRAFT;  Surgeon: Nadara Mustard, MD;  Location: [facility] OR;  Service: Orthopedics;  Laterality: Left;   I & D EXTREMITY  Left 08/22/2020   Procedure: DEBRIDEMENT LEFT BELOW KNEE AMPUTATION AND APPLY KERECIS SKIN GRAFT;  Surgeon: Nadara Mustard, MD;  Location: [facility] OR;  Service: Orthopedics;  Laterality: Left;   LITHOTRIPSY     TENDON LENGTHENING Bilateral    calf   TONSILLECTOMY      Short Social History:  Social History   Tobacco Use   Smoking status: Every Day    Current packs/day: 1.00    Average packs/day: 1 pack/day for 40.0 years (40.0 ttl pk-yrs)    Types: Cigarettes   Smokeless tobacco: Never  Substance Use Topics   Alcohol use: No    Allergies  Allergen Reactions   Hydromorphone Hcl Er Anaphylaxis and Other (See Comments)    Allergic to DYE in extended-release tablet, can tolerate other forms of hydromorphone   Tapentadol Anaphylaxis, Swelling and Other (See Comments)    THROAT ANGIOEDEMA Nucynta [Tapentadol Hydrochloride]   Exalamide Other (See Comments)    UNSPECIFIED REACTION     Current Outpatient Medications  Medication Sig Dispense Refill   oxyCODONE-acetaminophen (PERCOCET) 10-325 MG tablet Take 1 tablet by mouth every 4 (four) hours as needed for pain.     amitriptyline (ELAVIL) 50 MG tablet Take 1 tablet (50 mg total) by mouth at bedtime. (Patient not taking: Reported on 01/12/2023) 30 tablet 0   amLODipine (NORVASC) 10 MG tablet Take 10 mg by mouth daily. (Patient not taking: Reported on 01/12/2023)     aspirin EC 81 MG EC tablet Take 1 tablet (81 mg total) by mouth daily with breakfast. Swallow whole. (Patient not taking: Reported on 01/12/2023) 30 tablet 11   Dulaglutide 1.5 MG/0.5ML SOPN Inject 0.5 mLs into the skin once a week. (Patient not taking: Reported on 01/12/2023)     gabapentin (NEURONTIN) 300 MG capsule Take 300 mg by mouth 4 (four) times daily. (Patient not taking: Reported on 01/12/2023)     glucose blood (ONETOUCH VERIO) test strip USE TO CHECK BLOOD SUGAR 1-2 TIME(S) DAILY. (Patient not taking: Reported on 01/12/2023)     Insulin Pen Needle (PEN NEEDLES) 32G X 4 MM  MISC by Does not apply route. (Patient not taking: Reported on 01/12/2023)     ipratropium (ATROVENT) 0.02 % nebulizer solution Take 2.5 mLs (0.5 mg total) by nebulization 4 (four) times daily as needed for wheezing or shortness of breath. (Patient not taking: Reported on 01/12/2023) 2.5 mL 1   levalbuterol (XOPENEX) 0.63 MG/3ML nebulizer solution Take 3 mLs (0.63 mg total)  by nebulization every 2 (two) hours as needed for wheezing or shortness of breath. (Patient not taking: Reported on 01/12/2023) 3 mL 12   metFORMIN (GLUCOPHAGE) 500 MG tablet Take 1 tablet (500 mg total) by mouth 2 (two) times daily with a meal. 60 tablet 0   mometasone-formoterol (DULERA) 200-5 MCG/ACT AERO Inhale 2 puffs into the lungs 2 (two) times daily. (Patient not taking: Reported on 01/12/2023) 1 each 0   Mupirocin 2 % KIT Apply 1 Application topically daily. (Patient not taking: Reported on 01/12/2023) 1 kit 2   nitroGLYCERIN (NITRODUR - DOSED IN MG/24 HR) 0.2 mg/hr patch Place 1 patch (0.2 mg total) onto the skin daily. (Patient not taking: Reported on 01/12/2023) 30 patch 12   ondansetron (ZOFRAN) 4 MG tablet Take 1 tablet (4 mg total) by mouth every 6 (six) hours as needed for nausea. (Patient not taking: Reported on 01/12/2023) 20 tablet 0   pantoprazole (PROTONIX) 40 MG tablet Take 1 tablet (40 mg total) by mouth daily. (Patient not taking: Reported on 01/12/2023) 30 tablet 0   polyethylene glycol (MIRALAX / GLYCOLAX) 17 g packet Take 17 g by mouth daily as needed for moderate constipation. (Patient not taking: Reported on 01/12/2023) 14 each 0   potassium chloride (KLOR-CON) 10 MEQ tablet Take 1 tablet (10 mEq total) by mouth daily for 15 doses. 15 tablet 0   sulfamethoxazole-trimethoprim (BACTRIM DS) 800-160 MG tablet Take 1 tablet by mouth 2 (two) times daily. (Patient not taking: Reported on 01/12/2023) 20 tablet 0   tiZANidine (ZANAFLEX) 4 MG tablet Take 4-12 mg by mouth at bedtime as needed for muscle spasms. (Patient not  taking: Reported on 01/12/2023)     traZODone (DESYREL) 50 MG tablet Take 50-100 mg by mouth at bedtime. (Patient not taking: Reported on 01/12/2023)     No current facility-administered medications for this visit.    Review of Systems  Constitutional:  Constitutional negative. HENT: HENT negative.  Eyes: Eyes negative.  Respiratory: Respiratory negative.  Cardiovascular: Cardiovascular negative.  GI: Gastrointestinal negative.  Skin: Positive for wound.  Neurological: Neurological negative. Hematologic: Hematologic/lymphatic negative.  Psychiatric: Psychiatric negative.        Objective:  Objective   Vitals:   01/12/23 0954  BP: 94/71  Pulse: 94  Resp: 20  Temp: 98.6 F (37 C)  SpO2: 97%  Weight: 172 lb (78 kg)  Height: 6' (1.829 m)   Body mass index is 23.33 kg/m.  Physical Exam HENT:     Head: Normocephalic.     Nose: Nose normal.  Eyes:     Pupils: Pupils are equal, round, and reactive to light.  Cardiovascular:     Pulses:          Popliteal pulses are 0 on the right side.       Dorsalis pedis pulses are 0 on the right side.       Posterior tibial pulses are 0 on the right side.     Comments: Pulse exam difficult due to patient being seated in chair Pulmonary:     Effort: Pulmonary effort is normal.  Abdominal:     General: Abdomen is flat.     Palpations: Abdomen is soft.  Musculoskeletal:     Right lower leg: No edema.     Comments: Left above-knee amputation with shrinker in place Dressing on right foot  Neurological:     General: No focal deficit present.     Mental Status: He is alert.  Psychiatric:  Mood and Affect: Mood normal.     Data: ABI Findings:  +--------+------------------+-----+----------+--------+  Right  Rt Pressure (mmHg)IndexWaveform  Comment   +--------+------------------+-----+----------+--------+  [id number]                                       +--------+------------------+-----+----------+--------+   PTA    84                0.57 monophasic          +--------+------------------+-----+----------+--------+  DP     74                0.50 monophasic          +--------+------------------+-----+----------+--------+   +--------+------------------+-----+--------+-------+  Left   Lt Pressure (mmHg)IndexWaveformComment  +--------+------------------+-----+--------+-------+  [id number]                                    +--------+------------------+-----+--------+-------+   +-------+-----------+-----------+------------+------------+  ABI/TBIToday's ABIToday's TBIPrevious ABIPrevious TBI  +-------+-----------+-----------+------------+------------+  Right 0.57       amp        AKA                       +-------+-----------+-----------+------------+------------+  Left  1.04       amp        BKA                       +-------+-----------+-----------+------------+------------+        Previous ABI on 08/25/22.    Summary:  Right: Resting right ankle-brachial index indicates moderate right lower  extremity arterial disease.      Assessment/Plan:    63 year old male with history above left above-knee amputation with monophasic decreased ABIs in the right in the moderate range with wounds on the right foot.  We discussed that he is higher risk for right lower extremity amputation and the need for smoking cessation urgently.  We also discussed the need for aspirin 81 mg daily and he demonstrates good understanding. Number for angiography from the left common femoral possibly left brachial approach in the near future.  All risk benefits alternatives possible outcomes including stenting of the SFA possible need for surgery to include common femoral endarterectomy and bypass were also discussed and he demonstrates good understanding.  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 The patient is on  best medical therapy for peripheral arterial disease. The patient has been counseled about the risks of tobacco use in atherosclerotic disease. The patient has been counseled to abstain from any tobacco use. An aortogram with bilateral lower extremity runoff angiography and Right lower extremity intervention and is indicated to better evaluate the patient's lower extremity circulation because of the chronic limb threatening nature of the patient's diagnosis. Based on the patient's clinical exam and non-invasive data, we anticipate an endovascular intervention in the terminal aortic, iliac, and femoropopliteal vessels. Stenting and/or athrectomy would be favored because of the improved primary patency of these interventions as compared to plain balloon angioplasty.      Maeola Harman MD [facility]

## 2023-01-12 NOTE — Progress Notes (Signed)
Patient ID: Troy [name], male   DOB: 02-07-1961, 62 y.o.   MRN: [id number]  Reason for Consult: New Patient (Initial Visit)   Referred by Nadara Mustard, MD  Subjective:     HPI:  Troy [name] is a 61 y.o. 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  Has ulceration on the right foot with dressing in place today and he is followed by Dr. Lajoyce Corners.  He is sent here for vascular evaluation.  Patient does not wear prosthesis on the left lower extremity as such is wheelchair-bound but does use his right lower extremity for pivoting.  He is a current everyday smoker has been prescribed aspirin but does not take.  Past Medical History:  Diagnosis Date   Acute renal failure (HCC)    in setting of NSAID use and orthopedic surgery 2010   Anxiety and depression    Chronic diastolic CHF (congestive heart failure) (HCC)    a. Echo 6/17: severe conc LVH, vigorous EF, EF 65-70%, no dynamic obstruction, no RWMA, Gr 1 DD, mild TR  //  b. 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 LHC 2002: irregs  //  b. LHC in 8/17: no sig CAD, apical DK, hyperdynamic LV, LVEDP 28   History of kidney stones    History of nuclear stress test    a. Nuc 7/17: Overall, intermediate risk nuclear stress test secondary to small size of apical lateral defect and reduced ejection fraction.  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    Kidney calculi    Palpitations    PTSD (post-traumatic stress disorder)    Tobacco abuse    Family  History  Problem Relation Age of Onset   Leukemia Mother 70       died   Lung cancer Father 60       died   Heart attack Brother 65   Heart attack Brother 46   Hypertension Brother        X3   Hypertension Sister        X2   Diabetes Sister    Stroke Sister    Diabetes Sister    Other Brother        Set designer accident   Past Surgical History:  Procedure Laterality Date   AMPUTATION Left 01/03/2018   Procedure: LEFT MIDFOOT AMPUTATION/REVISION MIDAMPUTAION;  Surgeon: Kathryne Hitch, MD;  Location: [facility] OR;  Service: Orthopedics;  Laterality: Left;   AMPUTATION Left 01/25/2018   Procedure: LEFT BELOW KNEE AMPUTATION;  Surgeon: Nadara Mustard, MD;  Location: [facility] OR;  Service: Orthopedics;  Laterality: Left;   AMPUTATION Left 01/21/2021   Procedure: LEFT ABOVE KNEE AMPUTATION;  Surgeon: Nadara Mustard, MD;  Location: [facility] OR;  Service: Orthopedics;  Laterality: Left;   AMPUTATION TOE Right 07/17/2019   Procedure: AMPUTATION RIGHT FOOT  2ND TOE;  Surgeon: Kathryne Hitch, MD;  Location: [facility];  Service: Orthopedics;  Laterality: Right;   APPLICATION OF WOUND VAC Left 01/21/2021   Procedure: APPLICATION OF WOUND VAC;  Surgeon: Nadara Mustard, MD;  Location: [facility] OR;  Service: Orthopedics;  Laterality: Left;   BELOW KNEE LEG AMPUTATION Left 01/25/2018   CARDIAC CATHETERIZATION N/A 01/22/2016   Procedure: Left Heart Cath and Coronary Angiography;  Surgeon: Peter M Swaziland, MD;  Location: [facility] INVASIVE CV LAB;  Service: Cardiovascular;  Laterality: N/A;   FOOT AMPUTATION Bilateral    I & D EXTREMITY Left 12/15/2017   Procedure: IRRIGATION AND DEBRIDEMENT LEFT FOOT ULCER;  Surgeon: Kathryne Hitch, MD;  Location: [facility] ORS;  Service: Orthopedics;  Laterality: Left;   I & D EXTREMITY Left 07/25/2020   Procedure: LEFT BELOW KNEE AMPUTATION ABSCESS EXCISION AND SKIN GRAFT;  Surgeon: Nadara Mustard, MD;  Location: [facility] OR;  Service: Orthopedics;  Laterality: Left;   I & D EXTREMITY  Left 08/22/2020   Procedure: DEBRIDEMENT LEFT BELOW KNEE AMPUTATION AND APPLY KERECIS SKIN GRAFT;  Surgeon: Nadara Mustard, MD;  Location: [facility] OR;  Service: Orthopedics;  Laterality: Left;   LITHOTRIPSY     TENDON LENGTHENING Bilateral    calf   TONSILLECTOMY      Short Social History:  Social History   Tobacco Use   Smoking status: Every Day    Current packs/day: 1.00    Average packs/day: 1 pack/day for 40.0 years (40.0 ttl pk-yrs)    Types: Cigarettes   Smokeless tobacco: Never  Substance Use Topics   Alcohol use: No    Allergies  Allergen Reactions   Hydromorphone Hcl Er Anaphylaxis and Other (See Comments)    Allergic to DYE in extended-release tablet, can tolerate other forms of hydromorphone   Tapentadol Anaphylaxis, Swelling and Other (See Comments)    THROAT ANGIOEDEMA Nucynta [Tapentadol Hydrochloride]   Exalamide Other (See Comments)    UNSPECIFIED REACTION     Current Outpatient Medications  Medication Sig Dispense Refill   oxyCODONE-acetaminophen (PERCOCET) 10-325 MG tablet Take 1 tablet by mouth every 4 (four) hours as needed for pain.     amitriptyline (ELAVIL) 50 MG tablet Take 1 tablet (50 mg total) by mouth at bedtime. (Patient not taking: Reported on 01/12/2023) 30 tablet 0   amLODipine (NORVASC) 10 MG tablet Take 10 mg by mouth daily. (Patient not taking: Reported on 01/12/2023)     aspirin EC 81 MG EC tablet Take 1 tablet (81 mg total) by mouth daily with breakfast. Swallow whole. (Patient not taking: Reported on 01/12/2023) 30 tablet 11   Dulaglutide 1.5 MG/0.5ML SOPN Inject 0.5 mLs into the skin once a week. (Patient not taking: Reported on 01/12/2023)     gabapentin (NEURONTIN) 300 MG capsule Take 300 mg by mouth 4 (four) times daily. (Patient not taking: Reported on 01/12/2023)     glucose blood (ONETOUCH VERIO) test strip USE TO CHECK BLOOD SUGAR 1-2 TIME(S) DAILY. (Patient not taking: Reported on 01/12/2023)     Insulin Pen Needle (PEN NEEDLES) 32G X 4 MM  MISC by Does not apply route. (Patient not taking: Reported on 01/12/2023)     ipratropium (ATROVENT) 0.02 % nebulizer solution Take 2.5 mLs (0.5 mg total) by nebulization 4 (four) times daily as needed for wheezing or shortness of breath. (Patient not taking: Reported on 01/12/2023) 2.5 mL 1   levalbuterol (XOPENEX) 0.63 MG/3ML nebulizer solution Take 3 mLs (0.63 mg total)  by nebulization every 2 (two) hours as needed for wheezing or shortness of breath. (Patient not taking: Reported on 01/12/2023) 3 mL 12   metFORMIN (GLUCOPHAGE) 500 MG tablet Take 1 tablet (500 mg total) by mouth 2 (two) times daily with a meal. 60 tablet 0   mometasone-formoterol (DULERA) 200-5 MCG/ACT AERO Inhale 2 puffs into the lungs 2 (two) times daily. (Patient not taking: Reported on 01/12/2023) 1 each 0   Mupirocin 2 % KIT Apply 1 Application topically daily. (Patient not taking: Reported on 01/12/2023) 1 kit 2   nitroGLYCERIN (NITRODUR - DOSED IN MG/24 HR) 0.2 mg/hr patch Place 1 patch (0.2 mg total) onto the skin daily. (Patient not taking: Reported on 01/12/2023) 30 patch 12   ondansetron (ZOFRAN) 4 MG tablet Take 1 tablet (4 mg total) by mouth every 6 (six) hours as needed for nausea. (Patient not taking: Reported on 01/12/2023) 20 tablet 0   pantoprazole (PROTONIX) 40 MG tablet Take 1 tablet (40 mg total) by mouth daily. (Patient not taking: Reported on 01/12/2023) 30 tablet 0   polyethylene glycol (MIRALAX / GLYCOLAX) 17 g packet Take 17 g by mouth daily as needed for moderate constipation. (Patient not taking: Reported on 01/12/2023) 14 each 0   potassium chloride (KLOR-CON) 10 MEQ tablet Take 1 tablet (10 mEq total) by mouth daily for 15 doses. 15 tablet 0   sulfamethoxazole-trimethoprim (BACTRIM DS) 800-160 MG tablet Take 1 tablet by mouth 2 (two) times daily. (Patient not taking: Reported on 01/12/2023) 20 tablet 0   tiZANidine (ZANAFLEX) 4 MG tablet Take 4-12 mg by mouth at bedtime as needed for muscle spasms. (Patient not  taking: Reported on 01/12/2023)     traZODone (DESYREL) 50 MG tablet Take 50-100 mg by mouth at bedtime. (Patient not taking: Reported on 01/12/2023)     No current facility-administered medications for this visit.    Review of Systems  Constitutional:  Constitutional negative. HENT: HENT negative.  Eyes: Eyes negative.  Respiratory: Respiratory negative.  Cardiovascular: Cardiovascular negative.  GI: Gastrointestinal negative.  Skin: Positive for wound.  Neurological: Neurological negative. Hematologic: Hematologic/lymphatic negative.  Psychiatric: Psychiatric negative.        Objective:  Objective   Vitals:   01/12/23 0954  BP: 94/71  Pulse: 94  Resp: 20  Temp: 98.6 F (37 C)  SpO2: 97%  Weight: 172 lb (78 kg)  Height: 6' (1.829 m)   Body mass index is 23.33 kg/m.  Physical Exam HENT:     Head: Normocephalic.     Nose: Nose normal.  Eyes:     Pupils: Pupils are equal, round, and reactive to light.  Cardiovascular:     Pulses:          Popliteal pulses are 0 on the right side.       Dorsalis pedis pulses are 0 on the right side.       Posterior tibial pulses are 0 on the right side.     Comments: Pulse exam difficult due to patient being seated in chair Pulmonary:     Effort: Pulmonary effort is normal.  Abdominal:     General: Abdomen is flat.     Palpations: Abdomen is soft.  Musculoskeletal:     Right lower leg: No edema.     Comments: Left above-knee amputation with shrinker in place Dressing on right foot  Neurological:     General: No focal deficit present.     Mental Status: He is alert.  Psychiatric:  Mood and Affect: Mood normal.     Data: ABI Findings:  +--------+------------------+-----+----------+--------+  Right  Rt Pressure (mmHg)IndexWaveform  Comment   +--------+------------------+-----+----------+--------+  [id number]                                       +--------+------------------+-----+----------+--------+   PTA    84                0.57 monophasic          +--------+------------------+-----+----------+--------+  DP     74                0.50 monophasic          +--------+------------------+-----+----------+--------+   +--------+------------------+-----+--------+-------+  Left   Lt Pressure (mmHg)IndexWaveformComment  +--------+------------------+-----+--------+-------+  [id number]                                    +--------+------------------+-----+--------+-------+   +-------+-----------+-----------+------------+------------+  ABI/TBIToday's ABIToday's TBIPrevious ABIPrevious TBI  +-------+-----------+-----------+------------+------------+  Right 0.57       amp        AKA                       +-------+-----------+-----------+------------+------------+  Left  1.04       amp        BKA                       +-------+-----------+-----------+------------+------------+        Previous ABI on 08/25/22.    Summary:  Right: Resting right ankle-brachial index indicates moderate right lower  extremity arterial disease.      Assessment/Plan:    63 year old male with history above left above-knee amputation with monophasic decreased ABIs in the right in the moderate range with wounds on the right foot.  We discussed that he is higher risk for right lower extremity amputation and the need for smoking cessation urgently.  We also discussed the need for aspirin 81 mg daily and he demonstrates good understanding. Number for angiography from the left common femoral possibly left brachial approach in the near future.  All risk benefits alternatives possible outcomes including stenting of the SFA possible need for surgery to include common femoral endarterectomy and bypass were also discussed and he demonstrates good understanding.  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 The patient is on  best medical therapy for peripheral arterial disease. The patient has been counseled about the risks of tobacco use in atherosclerotic disease. The patient has been counseled to abstain from any tobacco use. An aortogram with bilateral lower extremity runoff angiography and Right lower extremity intervention and is indicated to better evaluate the patient's lower extremity circulation because of the chronic limb threatening nature of the patient's diagnosis. Based on the patient's clinical exam and non-invasive data, we anticipate an endovascular intervention in the terminal aortic, iliac, and femoropopliteal vessels. Stenting and/or athrectomy would be favored because of the improved primary patency of these interventions as compared to plain balloon angioplasty.      Maeola Harman MD [facility]

## 2023-01-17 ENCOUNTER — Telehealth: Payer: Self-pay | Admitting: *Deleted

## 2023-01-19 ENCOUNTER — Other Ambulatory Visit: Payer: Self-pay | Admitting: *Deleted

## 2023-01-19 ENCOUNTER — Encounter: Payer: Self-pay | Admitting: *Deleted

## 2023-01-19 DIAGNOSIS — I7025 Atherosclerosis of native arteries of other extremities with ulceration: Secondary | ICD-10-CM

## 2023-01-31 ENCOUNTER — Other Ambulatory Visit: Payer: Self-pay

## 2023-01-31 ENCOUNTER — Encounter (HOSPITAL_COMMUNITY)
Admission: RE | Disposition: A | Discharge status: Home / Self Care | Payer: Self-pay | Source: Home / Self Care | Attending: Vascular Surgery

## 2023-01-31 ENCOUNTER — Ambulatory Visit (HOSPITAL_COMMUNITY): Admission: RE | Admit: 2023-01-31 | Payer: Medicare Other | Source: Home / Self Care | Admitting: Vascular Surgery

## 2023-01-31 DIAGNOSIS — E11621 Type 2 diabetes mellitus with foot ulcer: Secondary | ICD-10-CM | POA: Insufficient documentation

## 2023-01-31 DIAGNOSIS — Z7984 Long term (current) use of oral hypoglycemic drugs: Secondary | ICD-10-CM | POA: Insufficient documentation

## 2023-01-31 DIAGNOSIS — E1151 Type 2 diabetes mellitus with diabetic peripheral angiopathy without gangrene: Secondary | ICD-10-CM | POA: Insufficient documentation

## 2023-01-31 DIAGNOSIS — I70244 Atherosclerosis of native arteries of left leg with ulceration of heel and midfoot: Secondary | ICD-10-CM | POA: Diagnosis not present

## 2023-01-31 DIAGNOSIS — I7025 Atherosclerosis of native arteries of other extremities with ulceration: Secondary | ICD-10-CM

## 2023-01-31 DIAGNOSIS — I70235 Atherosclerosis of native arteries of right leg with ulceration of other part of foot: Secondary | ICD-10-CM | POA: Diagnosis not present

## 2023-01-31 DIAGNOSIS — Z7985 Long-term (current) use of injectable non-insulin antidiabetic drugs: Secondary | ICD-10-CM | POA: Insufficient documentation

## 2023-01-31 DIAGNOSIS — L97519 Non-pressure chronic ulcer of other part of right foot with unspecified severity: Secondary | ICD-10-CM | POA: Diagnosis not present

## 2023-01-31 DIAGNOSIS — Z89612 Acquired absence of left leg above knee: Secondary | ICD-10-CM | POA: Diagnosis not present

## 2023-01-31 DIAGNOSIS — F17210 Nicotine dependence, cigarettes, uncomplicated: Secondary | ICD-10-CM | POA: Diagnosis not present

## 2023-01-31 HISTORY — PX: PERIPHERAL VASCULAR INTERVENTION: CATH118257

## 2023-01-31 HISTORY — PX: ABDOMINAL AORTOGRAM W/LOWER EXTREMITY: CATH118223

## 2023-01-31 LAB — GLUCOSE, CAPILLARY: Glucose-Capillary: 316 mg/dL — ABNORMAL HIGH (ref 70–99)

## 2023-01-31 LAB — POCT I-STAT, CHEM 8
BUN: 12 mg/dL (ref 8–23)
Calcium, Ion: 1.23 mmol/L (ref 1.15–1.40)
Chloride: 96 mmol/L — ABNORMAL LOW (ref 98–111)
Creatinine, Ser: 0.5 mg/dL — ABNORMAL LOW (ref 0.61–1.24)
Glucose, Bld: 323 mg/dL — ABNORMAL HIGH (ref 70–99)
HCT: 50 % (ref 39.0–52.0)
Hemoglobin: 17 g/dL (ref 13.0–17.0)
Potassium: 3.1 mmol/L — ABNORMAL LOW (ref 3.5–5.1)
Sodium: 137 mmol/L (ref 135–145)
TCO2: 29 mmol/L (ref 22–32)

## 2023-01-31 SURGERY — ABDOMINAL AORTOGRAM W/LOWER EXTREMITY
Anesthesia: LOCAL

## 2023-01-31 MED ORDER — SODIUM CHLORIDE 0.9 % WEIGHT BASED INFUSION: 1.0000 mL/kg/h | INTRAVENOUS | Status: DC

## 2023-01-31 MED ORDER — HEPARIN (PORCINE) IN NACL 1000-0.9 UT/500ML-% IV SOLN
INTRAVENOUS | Status: DC | PRN
  Administered 2023-01-31 (×2): 500 mL

## 2023-01-31 MED ORDER — HEPARIN SODIUM (PORCINE) 1000 UNIT/ML IJ SOLN
INTRAMUSCULAR | Status: AC
  Filled 2023-01-31: qty 10

## 2023-01-31 MED ORDER — MIDAZOLAM HCL 2 MG/2ML IJ SOLN
INTRAMUSCULAR | Status: DC | PRN
  Administered 2023-01-31 (×2): 1 mg via INTRAVENOUS

## 2023-01-31 MED ORDER — SODIUM CHLORIDE 0.9% FLUSH: 3.0000 mL | INTRAVENOUS | Status: DC | PRN

## 2023-01-31 MED ORDER — MORPHINE SULFATE (PF) 2 MG/ML IV SOLN: 2.0000 mg | INTRAVENOUS | Status: DC | PRN

## 2023-01-31 MED ORDER — SODIUM CHLORIDE 0.9 % IV SOLN: 250.0000 mL | INTRAVENOUS | Status: DC | PRN

## 2023-01-31 MED ORDER — CLOPIDOGREL BISULFATE 75 MG PO TABS: 75.0000 mg | ORAL_TABLET | Freq: Every day | ORAL | Status: DC

## 2023-01-31 MED ORDER — LABETALOL HCL 5 MG/ML IV SOLN
10.0000 mg | INTRAVENOUS | Status: DC | PRN
  Filled 2023-01-31: qty 4

## 2023-01-31 MED ORDER — CLOPIDOGREL BISULFATE 300 MG PO TABS
ORAL_TABLET | ORAL | Status: AC
  Filled 2023-01-31: qty 1

## 2023-01-31 MED ORDER — CLOPIDOGREL BISULFATE 75 MG PO TABS: 300.0000 mg | ORAL_TABLET | Freq: Once | ORAL | Status: DC

## 2023-01-31 MED ORDER — SODIUM CHLORIDE 0.9 % IV SOLN: INTRAVENOUS | Status: DC

## 2023-01-31 MED ORDER — ROSUVASTATIN CALCIUM 10 MG PO TABS
10.0000 mg | ORAL_TABLET | Freq: Every day | ORAL | 11 refills | Status: DC
Start: 2023-01-31 — End: 2023-02-16

## 2023-01-31 MED ORDER — CLOPIDOGREL BISULFATE 300 MG PO TABS
ORAL_TABLET | ORAL | Status: DC | PRN
  Administered 2023-01-31: 300 mg via ORAL

## 2023-01-31 MED ORDER — CLOPIDOGREL BISULFATE 75 MG PO TABS: 75.0000 mg | ORAL_TABLET | Freq: Every day | ORAL | 11 refills | Status: DC

## 2023-01-31 MED ORDER — FENTANYL CITRATE (PF) 100 MCG/2ML IJ SOLN
INTRAMUSCULAR | Status: DC | PRN
  Administered 2023-01-31 (×2): 50 ug via INTRAVENOUS

## 2023-01-31 MED ORDER — HYDRALAZINE HCL 20 MG/ML IJ SOLN: 5.0000 mg | INTRAMUSCULAR | Status: DC | PRN

## 2023-01-31 MED ORDER — ACETAMINOPHEN 325 MG PO TABS: 650.0000 mg | ORAL_TABLET | ORAL | Status: DC | PRN

## 2023-01-31 MED ORDER — FENTANYL CITRATE (PF) 100 MCG/2ML IJ SOLN
INTRAMUSCULAR | Status: AC
  Filled 2023-01-31: qty 2

## 2023-01-31 MED ORDER — MIDAZOLAM HCL 2 MG/2ML IJ SOLN
INTRAMUSCULAR | Status: AC
  Filled 2023-01-31: qty 2

## 2023-01-31 MED ORDER — LIDOCAINE HCL (PF) 1 % IJ SOLN
INTRAMUSCULAR | Status: DC | PRN
  Administered 2023-01-31: 15 mL

## 2023-01-31 MED ORDER — ROSUVASTATIN CALCIUM 10 MG PO TABS: 10.0000 mg | ORAL_TABLET | Freq: Every day | ORAL | Status: DC

## 2023-01-31 MED ORDER — IODIXANOL 320 MG/ML IV SOLN
INTRAVENOUS | Status: DC | PRN
  Administered 2023-01-31: 50 mL

## 2023-01-31 MED ORDER — SODIUM CHLORIDE 0.9% FLUSH: 3.0000 mL | Freq: Two times a day (BID) | INTRAVENOUS | Status: DC

## 2023-01-31 MED ORDER — HEPARIN SODIUM (PORCINE) 1000 UNIT/ML IJ SOLN
INTRAMUSCULAR | Status: DC | PRN
  Administered 2023-01-31: 5000 [IU] via INTRAVENOUS

## 2023-01-31 MED ORDER — ONDANSETRON HCL 4 MG/2ML IJ SOLN: 4.0000 mg | Freq: Four times a day (QID) | INTRAMUSCULAR | Status: DC | PRN

## 2023-01-31 MED ORDER — OXYCODONE HCL 5 MG PO TABS
5.0000 mg | ORAL_TABLET | ORAL | Status: DC | PRN
  Administered 2023-01-31: 10 mg via ORAL
  Filled 2023-01-31: qty 2

## 2023-01-31 MED ORDER — LIDOCAINE HCL (PF) 1 % IJ SOLN
INTRAMUSCULAR | Status: AC
  Filled 2023-01-31: qty 30

## 2023-01-31 SURGICAL SUPPLY — 22 items
BALLN MUSTANG 5.0X40 135 (BALLOONS) ×2
BALLOON MUSTANG 5.0X40 135 (BALLOONS) IMPLANT
CATH OMNI FLUSH 5F 65CM (CATHETERS) IMPLANT
CATH QUICKCROSS SUPP .035X90CM (MICROCATHETER) IMPLANT
CLOSURE MYNX CONTROL 6F/7F (Vascular Products) IMPLANT
COVER DOME SNAP 22 D (MISCELLANEOUS) IMPLANT
DEVICE TORQUE .025-.038 (MISCELLANEOUS) IMPLANT
GLIDEWIRE ANGLED SS 035X260CM (WIRE) IMPLANT
KIT ENCORE 26 ADVANTAGE (KITS) IMPLANT
KIT MICROPUNCTURE NIT STIFF (SHEATH) IMPLANT
PROTECTION STATION PRESSURIZED (MISCELLANEOUS) ×2
SET ATX-X65L (MISCELLANEOUS) IMPLANT
SHEATH CATAPULT 5F 45 MP (SHEATH) IMPLANT
SHEATH CATAPULT 6F 45 MP (SHEATH) IMPLANT
SHEATH PINNACLE 5F 10CM (SHEATH) IMPLANT
SHEATH PINNACLE 6F 10CM (SHEATH) IMPLANT
SHEATH PROBE COVER 6X72 (BAG) IMPLANT
STATION PROTECTION PRESSURIZED (MISCELLANEOUS) IMPLANT
STENT ELUVIA 6X60X130 (Permanent Stent) IMPLANT
TRAY PV CATH (CUSTOM PROCEDURE TRAY) ×2 IMPLANT
WIRE BENTSON .035X145CM (WIRE) IMPLANT
WIRE ROSEN-J .035X180CM (WIRE) IMPLANT

## 2023-01-31 NOTE — Interval H&P Note (Signed)
History and Physical Interval Note:  01/31/2023 11:32 AM  Troy [name]  has presented today for surgery, with the diagnosis of atherosclerosis of the native arteries of extremities with ulceration.  The various methods of treatment have been discussed with the patient and family. After consideration of risks, benefits and other options for treatment, the patient has consented to  Procedure(s): ABDOMINAL AORTOGRAM W/LOWER EXTREMITY (N/A) as a surgical intervention.  The patient's history has been reviewed, patient examined, no change in status, stable for surgery.  I have reviewed the patient's chart and labs.  Questions were answered to the patient's satisfaction.     Lemar Livings

## 2023-01-31 NOTE — Op Note (Signed)
    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 Randie Heinz, MD Procedure Performed: Ultrasound guided cannulation and percutaneous manage device closure left common femoral artery 2.  Aortogram with bilateral lower extremity runoff 3.  Selection of right popliteal artery 4.  Stent of right SFA with 6 x 60 mm Eluvia x 2 5.  Moderate sedation with fentanyl and Versed for 45 minutes  Indications: 62 year old male with a history of a left lower extremity amputation now with a wound on the right medial and lateral foot followed by Dr. Lajoyce Corners.  He is indicated for angiography with possible invention.  Findings: The aorta and iliac segments are free of flow-limiting stenosis of bilateral common femoral arteries are patent.  Left SFA appears occluded.  Right SFA initially has approximately 30% stenosis and is patent to the adductor where chronically occluded for approximately 5 cm and reconstitutes above-the-knee popliteal artery with three-vessel runoff to the foot.  Initially after stenting I had proximal disease and a stent was extended and a completion there was no residual stenosis where previously he was occluded and he has three-vessel runoff to the foot with a palpable dorsalis pedis pulse.   Procedure:  The patient was identified in the holding area and taken to room 8.  The patient was then placed supine on the table and prepped and draped in the usual sterile fashion.  A time out was called.  Ultrasound was used to evaluate the left common femoral artery which was noted to be patent and compressible.  The area was anesthetized with 1% lidocaine and cannulated with a micropuncture needle followed by wire and sheath.  Ultrasound images saved to the permanent record.  5 French sheath was placed and moderate sedation was  administered with fentanyl and Versed and his vital signs were monitored throughout the case.  Bentson wire and Omni catheter were placed to the level of L1 and aortogram was performed followed by bilateral extremity runoff to include the common femoral arteries.  We requested bifurcation rather extremity angiography.  With the above findings we placed a long 6 French sheath and the patient was fully heparinized.  Glidewire quick cross catheter were used to cross the occluded SFA and intraluminal access was confirmed at the popliteal artery above the knee.  We then placed a Rosen wire and initially stented with a distal 6 x 60 mm Eluvia but this had to be extended after balloon dilatation 5 mm balloon.  We stented placement with a 6 x 60 Eluvia and again dilated with 5 mm balloon.  Completion demonstrated brisk flow with no stenosis in the stents there is a palpable dorsalis pedis pulse with strong signals at the dorsalis pedis and posterior to the ankle.  We changed her story 6 French sheath remains device.  He tolerated the procedure without complication.  Contrast:  50cc   C. Randie Heinz, MD [facility] Office: [phone] Pager: [phone]

## 2023-01-31 NOTE — Interval H&P Note (Signed)
History and Physical Interval Note:  01/31/2023 12:51 PM  Troy [name]  has presented today for surgery, with the diagnosis of atherosclerosis of the native arteries of extremities with ulceration.  The various methods of treatment have been discussed with the patient and family. After consideration of risks, benefits and other options for treatment, the patient has consented to  Procedure(s): ABDOMINAL AORTOGRAM W/LOWER EXTREMITY (N/A) as a surgical intervention.  The patient's history has been reviewed, patient examined, no change in status, stable for surgery.  I have reviewed the patient's chart and labs.  Questions were answered to the patient's satisfaction.     Lemar Livings

## 2023-01-31 NOTE — Progress Notes (Signed)
This RN informed provider Cain,MD that the patient had a CBG of 323 and this RN was informed that that would be fine for not in order to avoid the patient dropping during procedure. Cain,MD also made aware of potassium of 3.1. Labs are result of I-stat pre-procedure. Charge Page,RN also informed.

## 2023-02-01 ENCOUNTER — Encounter (HOSPITAL_COMMUNITY): Payer: Self-pay | Admitting: Vascular Surgery

## 2023-02-04 ENCOUNTER — Other Ambulatory Visit: Payer: Self-pay

## 2023-02-04 ENCOUNTER — Encounter (HOSPITAL_COMMUNITY): Payer: Self-pay | Admitting: Emergency Medicine

## 2023-02-04 ENCOUNTER — Inpatient Hospital Stay (HOSPITAL_COMMUNITY)
Admission: EM | Admit: 2023-02-04 | Discharge: 2023-02-16 | DRG: 239 | Disposition: A | Discharge status: Home Health | Payer: Medicare Other | Attending: Internal Medicine | Admitting: Internal Medicine

## 2023-02-04 ENCOUNTER — Emergency Department (HOSPITAL_COMMUNITY): Payer: Medicare Other

## 2023-02-04 DIAGNOSIS — E1169 Type 2 diabetes mellitus with other specified complication: Secondary | ICD-10-CM | POA: Diagnosis present

## 2023-02-04 DIAGNOSIS — T81718A Complication of other artery following a procedure, not elsewhere classified, initial encounter: Secondary | ICD-10-CM | POA: Diagnosis present

## 2023-02-04 DIAGNOSIS — I959 Hypotension, unspecified: Secondary | ICD-10-CM | POA: Diagnosis not present

## 2023-02-04 DIAGNOSIS — I4892 Unspecified atrial flutter: Principal | ICD-10-CM

## 2023-02-04 DIAGNOSIS — K7581 Nonalcoholic steatohepatitis (NASH): Secondary | ICD-10-CM | POA: Diagnosis present

## 2023-02-04 DIAGNOSIS — Z801 Family history of malignant neoplasm of trachea, bronchus and lung: Secondary | ICD-10-CM

## 2023-02-04 DIAGNOSIS — J9621 Acute and chronic respiratory failure with hypoxia: Secondary | ICD-10-CM | POA: Diagnosis not present

## 2023-02-04 DIAGNOSIS — Y831 Surgical operation with implant of artificial internal device as the cause of abnormal reaction of the patient, or of later complication, without mention of misadventure at the time of the procedure: Secondary | ICD-10-CM | POA: Diagnosis present

## 2023-02-04 DIAGNOSIS — I471 Supraventricular tachycardia, unspecified: Secondary | ICD-10-CM | POA: Diagnosis not present

## 2023-02-04 DIAGNOSIS — Z79899 Other long term (current) drug therapy: Secondary | ICD-10-CM

## 2023-02-04 DIAGNOSIS — Z8616 Personal history of COVID-19: Secondary | ICD-10-CM | POA: Diagnosis not present

## 2023-02-04 DIAGNOSIS — Z89411 Acquired absence of right great toe: Secondary | ICD-10-CM

## 2023-02-04 DIAGNOSIS — Z95820 Peripheral vascular angioplasty status with implants and grafts: Secondary | ICD-10-CM | POA: Diagnosis not present

## 2023-02-04 DIAGNOSIS — Z7901 Long term (current) use of anticoagulants: Secondary | ICD-10-CM

## 2023-02-04 DIAGNOSIS — L089 Local infection of the skin and subcutaneous tissue, unspecified: Secondary | ICD-10-CM | POA: Diagnosis present

## 2023-02-04 DIAGNOSIS — Z7984 Long term (current) use of oral hypoglycemic drugs: Secondary | ICD-10-CM

## 2023-02-04 DIAGNOSIS — K7460 Unspecified cirrhosis of liver: Secondary | ICD-10-CM | POA: Diagnosis present

## 2023-02-04 DIAGNOSIS — I4891 Unspecified atrial fibrillation: Secondary | ICD-10-CM | POA: Diagnosis present

## 2023-02-04 DIAGNOSIS — D72829 Elevated white blood cell count, unspecified: Secondary | ICD-10-CM | POA: Diagnosis not present

## 2023-02-04 DIAGNOSIS — E871 Hypo-osmolality and hyponatremia: Secondary | ICD-10-CM | POA: Diagnosis present

## 2023-02-04 DIAGNOSIS — Z8249 Family history of ischemic heart disease and other diseases of the circulatory system: Secondary | ICD-10-CM

## 2023-02-04 DIAGNOSIS — I5032 Chronic diastolic (congestive) heart failure: Secondary | ICD-10-CM | POA: Diagnosis not present

## 2023-02-04 DIAGNOSIS — Z89421 Acquired absence of other right toe(s): Secondary | ICD-10-CM

## 2023-02-04 DIAGNOSIS — K219 Gastro-esophageal reflux disease without esophagitis: Secondary | ICD-10-CM | POA: Diagnosis present

## 2023-02-04 DIAGNOSIS — I4430 Unspecified atrioventricular block: Secondary | ICD-10-CM | POA: Diagnosis present

## 2023-02-04 DIAGNOSIS — T380X5A Adverse effect of glucocorticoids and synthetic analogues, initial encounter: Secondary | ICD-10-CM | POA: Diagnosis present

## 2023-02-04 DIAGNOSIS — R918 Other nonspecific abnormal finding of lung field: Secondary | ICD-10-CM

## 2023-02-04 DIAGNOSIS — E1140 Type 2 diabetes mellitus with diabetic neuropathy, unspecified: Secondary | ICD-10-CM | POA: Diagnosis present

## 2023-02-04 DIAGNOSIS — J9611 Chronic respiratory failure with hypoxia: Secondary | ICD-10-CM

## 2023-02-04 DIAGNOSIS — J449 Chronic obstructive pulmonary disease, unspecified: Secondary | ICD-10-CM | POA: Diagnosis present

## 2023-02-04 DIAGNOSIS — Z794 Long term (current) use of insulin: Secondary | ICD-10-CM

## 2023-02-04 DIAGNOSIS — E1165 Type 2 diabetes mellitus with hyperglycemia: Secondary | ICD-10-CM | POA: Diagnosis present

## 2023-02-04 DIAGNOSIS — E785 Hyperlipidemia, unspecified: Secondary | ICD-10-CM | POA: Diagnosis present

## 2023-02-04 DIAGNOSIS — E11621 Type 2 diabetes mellitus with foot ulcer: Secondary | ICD-10-CM | POA: Diagnosis present

## 2023-02-04 DIAGNOSIS — F17210 Nicotine dependence, cigarettes, uncomplicated: Secondary | ICD-10-CM | POA: Diagnosis not present

## 2023-02-04 DIAGNOSIS — I9589 Other hypotension: Secondary | ICD-10-CM | POA: Diagnosis present

## 2023-02-04 DIAGNOSIS — J810 Acute pulmonary edema: Secondary | ICD-10-CM | POA: Diagnosis not present

## 2023-02-04 DIAGNOSIS — F4310 Post-traumatic stress disorder, unspecified: Secondary | ICD-10-CM | POA: Diagnosis present

## 2023-02-04 DIAGNOSIS — M86471 Chronic osteomyelitis with draining sinus, right ankle and foot: Secondary | ICD-10-CM | POA: Diagnosis not present

## 2023-02-04 DIAGNOSIS — I724 Aneurysm of artery of lower extremity: Secondary | ICD-10-CM | POA: Diagnosis not present

## 2023-02-04 DIAGNOSIS — J704 Drug-induced interstitial lung disorders, unspecified: Secondary | ICD-10-CM | POA: Diagnosis present

## 2023-02-04 DIAGNOSIS — Z823 Family history of stroke: Secondary | ICD-10-CM

## 2023-02-04 DIAGNOSIS — Z806 Family history of leukemia: Secondary | ICD-10-CM

## 2023-02-04 DIAGNOSIS — E872 Acidosis, unspecified: Secondary | ICD-10-CM | POA: Diagnosis present

## 2023-02-04 DIAGNOSIS — I4819 Other persistent atrial fibrillation: Principal | ICD-10-CM | POA: Diagnosis present

## 2023-02-04 DIAGNOSIS — Z7982 Long term (current) use of aspirin: Secondary | ICD-10-CM

## 2023-02-04 DIAGNOSIS — Z89612 Acquired absence of left leg above knee: Secondary | ICD-10-CM

## 2023-02-04 DIAGNOSIS — L97519 Non-pressure chronic ulcer of other part of right foot with unspecified severity: Secondary | ICD-10-CM | POA: Diagnosis present

## 2023-02-04 DIAGNOSIS — I110 Hypertensive heart disease with heart failure: Secondary | ICD-10-CM | POA: Diagnosis present

## 2023-02-04 DIAGNOSIS — I10 Essential (primary) hypertension: Secondary | ICD-10-CM | POA: Diagnosis not present

## 2023-02-04 DIAGNOSIS — Z833 Family history of diabetes mellitus: Secondary | ICD-10-CM

## 2023-02-04 DIAGNOSIS — E876 Hypokalemia: Secondary | ICD-10-CM | POA: Diagnosis present

## 2023-02-04 DIAGNOSIS — I739 Peripheral vascular disease, unspecified: Secondary | ICD-10-CM | POA: Diagnosis not present

## 2023-02-04 DIAGNOSIS — I5033 Acute on chronic diastolic (congestive) heart failure: Secondary | ICD-10-CM

## 2023-02-04 DIAGNOSIS — Z888 Allergy status to other drugs, medicaments and biological substances status: Secondary | ICD-10-CM

## 2023-02-04 DIAGNOSIS — E1152 Type 2 diabetes mellitus with diabetic peripheral angiopathy with gangrene: Secondary | ICD-10-CM | POA: Diagnosis present

## 2023-02-04 DIAGNOSIS — I480 Paroxysmal atrial fibrillation: Secondary | ICD-10-CM | POA: Diagnosis not present

## 2023-02-04 DIAGNOSIS — D6869 Other thrombophilia: Secondary | ICD-10-CM | POA: Diagnosis not present

## 2023-02-04 DIAGNOSIS — T462X5A Adverse effect of other antidysrhythmic drugs, initial encounter: Secondary | ICD-10-CM | POA: Diagnosis present

## 2023-02-04 DIAGNOSIS — Z7902 Long term (current) use of antithrombotics/antiplatelets: Secondary | ICD-10-CM

## 2023-02-04 DIAGNOSIS — Z8701 Personal history of pneumonia (recurrent): Secondary | ICD-10-CM

## 2023-02-04 DIAGNOSIS — Z885 Allergy status to narcotic agent status: Secondary | ICD-10-CM

## 2023-02-04 LAB — COMPREHENSIVE METABOLIC PANEL
ALT: 13 U/L (ref 0–44)
AST: 15 U/L (ref 15–41)
Albumin: 3.7 g/dL (ref 3.5–5.0)
Alkaline Phosphatase: 93 U/L (ref 38–126)
Anion gap: 16 — ABNORMAL HIGH (ref 5–15)
BUN: 8 mg/dL (ref 8–23)
CO2: 25 mmol/L (ref 22–32)
Calcium: 8.7 mg/dL — ABNORMAL LOW (ref 8.9–10.3)
Chloride: 87 mmol/L — ABNORMAL LOW (ref 98–111)
Creatinine, Ser: 0.72 mg/dL (ref 0.61–1.24)
GFR, Estimated: >60 mL/min (ref >60)
Glucose, Bld: 428 mg/dL — ABNORMAL HIGH (ref 70–99)
Potassium: 2.5 mmol/L — CL (ref 3.5–5.1)
Sodium: 128 mmol/L — ABNORMAL LOW (ref 135–145)
Total Bilirubin: 1.3 mg/dL — ABNORMAL HIGH (ref 0.3–1.2)
Total Protein: 7.6 g/dL (ref 6.5–8.1)

## 2023-02-04 LAB — CBC WITH DIFFERENTIAL/PLATELET
Abs Immature Granulocytes: 0.13 10*3/uL — ABNORMAL HIGH (ref 0.00–0.07)
Basophils Absolute: 0.1 10*3/uL (ref 0.0–0.1)
Basophils Relative: 0 %
Eosinophils Absolute: 0 10*3/uL (ref 0.0–0.5)
Eosinophils Relative: 0 %
HCT: 43.7 % (ref 39.0–52.0)
Hemoglobin: 15.5 g/dL (ref 13.0–17.0)
Immature Granulocytes: 1 %
Lymphocytes Relative: 12 %
Lymphs Abs: 2.4 10*3/uL (ref 0.7–4.0)
MCH: 28.6 pg (ref 26.0–34.0)
MCHC: 35.5 g/dL (ref 30.0–36.0)
MCV: 80.6 fL (ref 80.0–100.0)
Monocytes Absolute: 1.4 10*3/uL — ABNORMAL HIGH (ref 0.1–1.0)
Monocytes Relative: 7 %
Neutro Abs: 16.6 10*3/uL — ABNORMAL HIGH (ref 1.7–7.7)
Neutrophils Relative %: 80 %
Platelets: 320 10*3/uL (ref 150–400)
RBC: 5.42 MIL/uL (ref 4.22–5.81)
RDW: 12.8 % (ref 11.5–15.5)
WBC: 20.7 10*3/uL — ABNORMAL HIGH (ref 4.0–10.5)
nRBC: 0 % (ref 0.0–0.2)

## 2023-02-04 LAB — TROPONIN I (HIGH SENSITIVITY)
Troponin I (High Sensitivity): 12 ng/L (ref <18)
Troponin I (High Sensitivity): 15 ng/L (ref <18)

## 2023-02-04 LAB — CBG MONITORING, ED: Glucose-Capillary: 432 mg/dL — ABNORMAL HIGH (ref 70–99)

## 2023-02-04 MED ORDER — METOPROLOL TARTRATE 5 MG/5ML IV SOLN
5.0000 mg | Freq: Once | INTRAVENOUS | Status: AC
  Administered 2023-02-04: 5 mg via INTRAVENOUS
  Filled 2023-02-04: qty 5

## 2023-02-04 MED ORDER — ADENOSINE 6 MG/2ML IV SOLN
INTRAVENOUS | Status: AC
  Filled 2023-02-04: qty 2

## 2023-02-04 MED ORDER — AMIODARONE HCL IN DEXTROSE 360-4.14 MG/200ML-% IV SOLN
30.0000 mg/h | INTRAVENOUS | Status: DC
  Administered 2023-02-05 – 2023-02-06 (×3): 30 mg/h via INTRAVENOUS
  Filled 2023-02-04 (×4): qty 200

## 2023-02-04 MED ORDER — POTASSIUM CHLORIDE 10 MEQ/100ML IV SOLN
10.0000 meq | INTRAVENOUS | Status: AC
  Administered 2023-02-04 (×2): 10 meq via INTRAVENOUS
  Filled 2023-02-04 (×2): qty 100

## 2023-02-04 MED ORDER — POTASSIUM CHLORIDE CRYS ER 20 MEQ PO TBCR
40.0000 meq | EXTENDED_RELEASE_TABLET | Freq: Once | ORAL | Status: AC
  Administered 2023-02-04: 40 meq via ORAL
  Filled 2023-02-04: qty 2

## 2023-02-04 MED ORDER — ADENOSINE 6 MG/2ML IV SOLN
INTRAVENOUS | Status: AC
  Filled 2023-02-04: qty 4

## 2023-02-04 MED ORDER — POTASSIUM CHLORIDE 10 MEQ/100ML IV SOLN: 10.0000 meq | Freq: Once | INTRAVENOUS | Status: DC

## 2023-02-04 MED ORDER — AMIODARONE IV BOLUS ONLY 150 MG/100ML
150.0000 mg | Freq: Once | INTRAVENOUS | Status: AC
  Administered 2023-02-04: 150 mg via INTRAVENOUS
  Filled 2023-02-04: qty 100

## 2023-02-04 MED ORDER — ADENOSINE 6 MG/2ML IV SOLN
6.0000 mg | Freq: Once | INTRAVENOUS | Status: AC
  Administered 2023-02-04: 6 mg via INTRAVENOUS

## 2023-02-04 MED ORDER — AMIODARONE HCL IN DEXTROSE 360-4.14 MG/200ML-% IV SOLN
60.0000 mg/h | INTRAVENOUS | Status: AC
  Administered 2023-02-04: 60 mg/h via INTRAVENOUS

## 2023-02-04 MED ORDER — DIGOXIN 0.25 MG/ML IJ SOLN
0.5000 mg | Freq: Once | INTRAMUSCULAR | Status: AC
  Administered 2023-02-04: 0.5 mg via INTRAVENOUS
  Filled 2023-02-04: qty 2

## 2023-02-04 MED ORDER — DILTIAZEM HCL 25 MG/5ML IV SOLN
10.0000 mg | Freq: Once | INTRAVENOUS | Status: AC
  Administered 2023-02-04: 10 mg via INTRAVENOUS
  Filled 2023-02-04: qty 5

## 2023-02-04 MED ORDER — SODIUM CHLORIDE 0.9 % IV BOLUS
500.0000 mL | Freq: Once | INTRAVENOUS | Status: AC
  Administered 2023-02-04: 500 mL via INTRAVENOUS

## 2023-02-04 MED ORDER — DILTIAZEM HCL-DEXTROSE 125-5 MG/125ML-% IV SOLN (PREMIX)
5.0000 mg/h | INTRAVENOUS | Status: DC
  Administered 2023-02-04: 10 mg/h via INTRAVENOUS
  Administered 2023-02-04: 5 mg/h via INTRAVENOUS
  Filled 2023-02-04: qty 125

## 2023-02-04 MED ORDER — HEPARIN BOLUS VIA INFUSION
3000.0000 [IU] | Freq: Once | INTRAVENOUS | Status: AC
  Administered 2023-02-04: 3000 [IU] via INTRAVENOUS

## 2023-02-04 MED ORDER — ADENOSINE 6 MG/2ML IV SOLN
12.0000 mg | Freq: Once | INTRAVENOUS | Status: AC
  Administered 2023-02-04: 12 mg via INTRAVENOUS

## 2023-02-04 MED ORDER — HEPARIN (PORCINE) 25000 UT/250ML-% IV SOLN
1800.0000 [IU]/h | INTRAVENOUS | Status: DC
  Administered 2023-02-04: 1150 [IU]/h via INTRAVENOUS
  Administered 2023-02-05: 1450 [IU]/h via INTRAVENOUS
  Administered 2023-02-06: 1500 [IU]/h via INTRAVENOUS
  Administered 2023-02-07 (×2): 1650 [IU]/h via INTRAVENOUS
  Administered 2023-02-08: 1800 [IU]/h via INTRAVENOUS
  Filled 2023-02-04 (×6): qty 250

## 2023-02-04 MED ORDER — SODIUM CHLORIDE 0.9 % IV BOLUS
2000.0000 mL | Freq: Once | INTRAVENOUS | Status: AC
  Administered 2023-02-04: 2000 mL via INTRAVENOUS

## 2023-02-04 NOTE — ED Notes (Signed)
Cardizem drip stopped per EDP d/t BP

## 2023-02-04 NOTE — ED Triage Notes (Signed)
Pt to ed from home c/o of A-fib fast HR, high BP, and bleeding foot. Pt had angioplasty done this past Monday and while that was being done, the dr removed dead tissue from pt's lateral foot. Pt's HR in triage 170.

## 2023-02-04 NOTE — ED Notes (Signed)
Contacted cardiology again.

## 2023-02-04 NOTE — ED Notes (Signed)
Called [facility] for cardiologist again for Dr. Estell Harpin

## 2023-02-04 NOTE — ED Notes (Signed)
10 mg of diltiazem given as a bolus per EDP verbal order

## 2023-02-04 NOTE — Progress Notes (Signed)
ANTICOAGULATION CONSULT NOTE - Initial Consult  Pharmacy Consult for heparin  Indication: atrial fibrillation  Allergies  Allergen Reactions   Hydromorphone Hcl Er Anaphylaxis and Other (See Comments)    Allergic to DYE in extended-release tablet, can tolerate other forms of hydromorphone   Tapentadol Anaphylaxis, Swelling and Other (See Comments)    THROAT ANGIOEDEMA Nucynta [Tapentadol Hydrochloride]   Exalamide Other (See Comments)    UNSPECIFIED REACTION     Patient Measurements: Height: 5\' 11"  (180.3 cm) Weight: 79.4 kg (175 lb) IBW/kg (Calculated) : 75.3   Vital Signs: Temp: 97.6 F (36.4 C) (08/16 2002) Temp Source: Oral (08/16 2002) BP: 92/62 (08/16 2130) Pulse Rate: 88 (08/16 2130)  Labs: Recent Labs    02/04/23 2000  HGB 15.5  HCT 43.7  PLT 320  CREATININE 0.72  TROPONINIHS 12    Estimated Creatinine Clearance: 102 mL/min (by C-G formula based on SCr of 0.72 mg/dL).   Medical History: Past Medical History:  Diagnosis Date   Acute renal failure (HCC)    in setting of NSAID use and orthopedic surgery 2010   Anxiety and depression    Chronic diastolic CHF (congestive heart failure) (HCC)    a. Echo 6/17: severe conc LVH, vigorous EF, EF 65-70%, no dynamic obstruction, no RWMA, Gr 1 DD, mild TR  //  b. 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 LHC 2002: irregs  //  b. LHC in 8/17: no sig CAD, apical DK, hyperdynamic LV, LVEDP 28   History of kidney stones    History of nuclear stress test    a. Nuc 7/17: Overall, intermediate risk nuclear stress test secondary to small size of apical lateral defect and reduced  ejection fraction.  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    Kidney calculi    Palpitations    PTSD (post-traumatic stress disorder)    Tobacco abuse       Assessment: 61yom admitted with new Afib RVR attempted to rate control with diltiazem.  Hx preserved EF70% 2022, recent PAD s/p amputation L AKA and VVS intervention R LE.  No anticoagulants PTA  Wbc elevated 20, h/h stable  Goal of Therapy:  Heparin level 0.3-0.7 units/ml Monitor platelets by anticoagulation protocol: Yes   Plan:  Heparin bolus 3000 uts IV x1 Heparin drip 1150 uts/hr  Check heparin level 6hr after start  Daily heparin level and CBC Monitor s/s bleeding  Leota Sauers Pharm.D. CPP, BCPS Clinical Pharmacist [phone] 02/04/2023 9:44 PM
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
## 2023-02-04 NOTE — ED Notes (Signed)
Transport called.

## 2023-02-04 NOTE — ED Notes (Signed)
Called [facility] for intensivist Critical care @2210 

## 2023-02-05 ENCOUNTER — Inpatient Hospital Stay (HOSPITAL_COMMUNITY): Payer: Medicare Other

## 2023-02-05 DIAGNOSIS — I959 Hypotension, unspecified: Secondary | ICD-10-CM

## 2023-02-05 DIAGNOSIS — I4891 Unspecified atrial fibrillation: Secondary | ICD-10-CM | POA: Diagnosis not present

## 2023-02-05 DIAGNOSIS — I4892 Unspecified atrial flutter: Secondary | ICD-10-CM | POA: Diagnosis present

## 2023-02-05 DIAGNOSIS — E876 Hypokalemia: Secondary | ICD-10-CM

## 2023-02-05 LAB — URINALYSIS, ROUTINE W REFLEX MICROSCOPIC
Bacteria, UA: NONE SEEN
Bilirubin Urine: NEGATIVE
Glucose, UA: >=500 mg/dL — AB
Hgb urine dipstick: NEGATIVE
Ketones, ur: NEGATIVE mg/dL
Leukocytes,Ua: NEGATIVE
Nitrite: NEGATIVE
Protein, ur: 100 mg/dL — AB
Specific Gravity, Urine: 1.035 — ABNORMAL HIGH (ref 1.005–1.030)
pH: 6 (ref 5.0–8.0)

## 2023-02-05 LAB — HEPARIN LEVEL (UNFRACTIONATED)
Heparin Unfractionated: 0.2 [IU]/mL — ABNORMAL LOW (ref 0.30–0.70)
Heparin Unfractionated: 0.15 [IU]/mL — ABNORMAL LOW (ref 0.30–0.70)
Heparin Unfractionated: 0.34 [IU]/mL (ref 0.30–0.70)

## 2023-02-05 LAB — HEMOGLOBIN A1C
Hgb A1c MFr Bld: 11.7 % — ABNORMAL HIGH (ref 4.8–5.6)
Mean Plasma Glucose: 289.09 mg/dL

## 2023-02-05 LAB — BASIC METABOLIC PANEL
Anion gap: 13 (ref 5–15)
BUN: 7 mg/dL — ABNORMAL LOW (ref 8–23)
CO2: 25 mmol/L (ref 22–32)
Calcium: 8 mg/dL — ABNORMAL LOW (ref 8.9–10.3)
Chloride: 93 mmol/L — ABNORMAL LOW (ref 98–111)
Creatinine, Ser: 0.55 mg/dL — ABNORMAL LOW (ref 0.61–1.24)
GFR, Estimated: >60 mL/min (ref >60)
Glucose, Bld: 298 mg/dL — ABNORMAL HIGH (ref 70–99)
Potassium: 3.5 mmol/L (ref 3.5–5.1)
Sodium: 131 mmol/L — ABNORMAL LOW (ref 135–145)

## 2023-02-05 LAB — TROPONIN I (HIGH SENSITIVITY): Troponin I (High Sensitivity): 12 ng/L (ref <18)

## 2023-02-05 LAB — LACTIC ACID, PLASMA
Lactic Acid, Venous: 2.5 mmol/L (ref 0.5–1.9)
Lactic Acid, Venous: 1.7 mmol/L (ref 0.5–1.9)

## 2023-02-05 LAB — GLUCOSE, CAPILLARY
Glucose-Capillary: 140 mg/dL — ABNORMAL HIGH (ref 70–99)
Glucose-Capillary: 171 mg/dL — ABNORMAL HIGH (ref 70–99)
Glucose-Capillary: 183 mg/dL — ABNORMAL HIGH (ref 70–99)
Glucose-Capillary: 188 mg/dL — ABNORMAL HIGH (ref 70–99)
Glucose-Capillary: 215 mg/dL — ABNORMAL HIGH (ref 70–99)
Glucose-Capillary: 273 mg/dL — ABNORMAL HIGH (ref 70–99)
Glucose-Capillary: 366 mg/dL — ABNORMAL HIGH (ref 70–99)

## 2023-02-05 LAB — MRSA NEXT GEN BY PCR, NASAL: MRSA by PCR Next Gen: NOT DETECTED

## 2023-02-05 LAB — CBC
HCT: 38.7 % — ABNORMAL LOW (ref 39.0–52.0)
Hemoglobin: 13.6 g/dL (ref 13.0–17.0)
MCH: 28.7 pg (ref 26.0–34.0)
MCHC: 35.1 g/dL (ref 30.0–36.0)
MCV: 81.6 fL (ref 80.0–100.0)
Platelets: 228 10*3/uL (ref 150–400)
RBC: 4.74 MIL/uL (ref 4.22–5.81)
RDW: 12.8 % (ref 11.5–15.5)
WBC: 14.7 10*3/uL — ABNORMAL HIGH (ref 4.0–10.5)
nRBC: 0 % (ref 0.0–0.2)

## 2023-02-05 LAB — BRAIN NATRIURETIC PEPTIDE: B Natriuretic Peptide: 298.2 pg/mL — ABNORMAL HIGH (ref 0.0–100.0)

## 2023-02-05 LAB — BETA-HYDROXYBUTYRIC ACID: Beta-Hydroxybutyric Acid: 0.08 mmol/L (ref 0.05–0.27)

## 2023-02-05 LAB — HIV ANTIBODY (ROUTINE TESTING W REFLEX): HIV Screen 4th Generation wRfx: NONREACTIVE

## 2023-02-05 LAB — MAGNESIUM
Magnesium: 1.2 mg/dL — ABNORMAL LOW (ref 1.7–2.4)
Magnesium: 1.7 mg/dL (ref 1.7–2.4)

## 2023-02-05 LAB — PHOSPHORUS: Phosphorus: 2 mg/dL — ABNORMAL LOW (ref 2.5–4.6)

## 2023-02-05 MED ORDER — MAGNESIUM SULFATE 4 GM/100ML IV SOLN
4.0000 g | Freq: Once | INTRAVENOUS | Status: AC
  Administered 2023-02-05: 4 g via INTRAVENOUS
  Filled 2023-02-05: qty 100

## 2023-02-05 MED ORDER — POTASSIUM CHLORIDE 10 MEQ/50ML IV SOLN
10.0000 meq | INTRAVENOUS | Status: DC
Start: 2023-02-05 — End: 2023-02-05

## 2023-02-05 MED ORDER — ORAL CARE MOUTH RINSE: 15.0000 mL | OROMUCOSAL | Status: DC | PRN

## 2023-02-05 MED ORDER — POTASSIUM CHLORIDE 10 MEQ/100ML IV SOLN
10.0000 meq | INTRAVENOUS | Status: AC
  Administered 2023-02-05 (×4): 10 meq via INTRAVENOUS
  Filled 2023-02-05 (×4): qty 100

## 2023-02-05 MED ORDER — ASPIRIN 81 MG PO TBEC
81.0000 mg | DELAYED_RELEASE_TABLET | Freq: Every day | ORAL | Status: DC
  Administered 2023-02-05 – 2023-02-16 (×12): 81 mg via ORAL
  Filled 2023-02-05 (×12): qty 1

## 2023-02-05 MED ORDER — ONDANSETRON HCL 4 MG/2ML IJ SOLN
4.0000 mg | Freq: Once | INTRAMUSCULAR | Status: AC
  Administered 2023-02-05: 4 mg via INTRAVENOUS

## 2023-02-05 MED ORDER — LACTATED RINGERS IV SOLN: INTRAVENOUS | Status: AC

## 2023-02-05 MED ORDER — VANCOMYCIN HCL 1250 MG/250ML IV SOLN
1250.0000 mg | Freq: Two times a day (BID) | INTRAVENOUS | Status: AC
  Administered 2023-02-05 – 2023-02-10 (×11): 1250 mg via INTRAVENOUS
  Filled 2023-02-05 (×11): qty 250

## 2023-02-05 MED ORDER — INSULIN GLARGINE-YFGN 100 UNIT/ML ~~LOC~~ SOLN
10.0000 [IU] | Freq: Every day | SUBCUTANEOUS | Status: DC
  Administered 2023-02-06 – 2023-02-09 (×4): 10 [IU] via SUBCUTANEOUS
  Filled 2023-02-05 (×7): qty 0.1

## 2023-02-05 MED ORDER — INSULIN ASPART 100 UNIT/ML IJ SOLN
0.0000 [IU] | INTRAMUSCULAR | Status: DC
  Administered 2023-02-05: 3 [IU] via SUBCUTANEOUS
  Administered 2023-02-05: 15 [IU] via SUBCUTANEOUS
  Administered 2023-02-05: 3 [IU] via SUBCUTANEOUS
  Administered 2023-02-05: 2 [IU] via SUBCUTANEOUS
  Administered 2023-02-05: 5 [IU] via SUBCUTANEOUS
  Administered 2023-02-05: 8 [IU] via SUBCUTANEOUS
  Administered 2023-02-05: 3 [IU] via SUBCUTANEOUS
  Administered 2023-02-06: 5 [IU] via SUBCUTANEOUS
  Administered 2023-02-06 (×2): 3 [IU] via SUBCUTANEOUS
  Administered 2023-02-06: 5 [IU] via SUBCUTANEOUS
  Administered 2023-02-06 (×2): 3 [IU] via SUBCUTANEOUS
  Administered 2023-02-07: 2 [IU] via SUBCUTANEOUS
  Administered 2023-02-07: 3 [IU] via SUBCUTANEOUS
  Administered 2023-02-07: 5 [IU] via SUBCUTANEOUS
  Administered 2023-02-07 – 2023-02-08 (×6): 3 [IU] via SUBCUTANEOUS
  Administered 2023-02-08: 2 [IU] via SUBCUTANEOUS
  Administered 2023-02-08: 5 [IU] via SUBCUTANEOUS

## 2023-02-05 MED ORDER — MAGNESIUM SULFATE 2 GM/50ML IV SOLN
2.0000 g | Freq: Once | INTRAVENOUS | Status: AC
  Administered 2023-02-05: 2 g via INTRAVENOUS
  Filled 2023-02-05: qty 50

## 2023-02-05 MED ORDER — ROSUVASTATIN CALCIUM 5 MG PO TABS
10.0000 mg | ORAL_TABLET | Freq: Every day | ORAL | Status: DC
  Administered 2023-02-05 – 2023-02-11 (×7): 10 mg via ORAL
  Filled 2023-02-05 (×2): qty 2
  Filled 2023-02-05 (×4): qty 1
  Filled 2023-02-05: qty 2

## 2023-02-05 MED ORDER — OXYCODONE HCL 5 MG PO TABS
10.0000 mg | ORAL_TABLET | ORAL | Status: DC | PRN
  Administered 2023-02-05 – 2023-02-09 (×18): 10 mg via ORAL
  Filled 2023-02-05 (×20): qty 2

## 2023-02-05 MED ORDER — ONDANSETRON HCL 4 MG/2ML IJ SOLN
INTRAMUSCULAR | Status: AC
  Filled 2023-02-05: qty 2

## 2023-02-05 MED ORDER — OXYCODONE HCL 5 MG PO TABS
5.0000 mg | ORAL_TABLET | ORAL | Status: DC | PRN
  Administered 2023-02-05 (×2): 5 mg via ORAL
  Filled 2023-02-05 (×2): qty 1

## 2023-02-05 MED ORDER — HEPARIN BOLUS VIA INFUSION
2000.0000 [IU] | Freq: Once | INTRAVENOUS | Status: AC
  Administered 2023-02-05: 2000 [IU] via INTRAVENOUS
  Filled 2023-02-05: qty 2000

## 2023-02-05 MED ORDER — POTASSIUM PHOSPHATES 15 MMOLE/5ML IV SOLN
15.0000 mmol | Freq: Once | INTRAVENOUS | Status: AC
  Administered 2023-02-05: 15 mmol via INTRAVENOUS
  Filled 2023-02-05: qty 5

## 2023-02-05 MED ORDER — DOCUSATE SODIUM 100 MG PO CAPS
100.0000 mg | ORAL_CAPSULE | Freq: Two times a day (BID) | ORAL | Status: DC | PRN
  Filled 2023-02-05: qty 1

## 2023-02-05 MED ORDER — METRONIDAZOLE 500 MG/100ML IV SOLN
500.0000 mg | Freq: Two times a day (BID) | INTRAVENOUS | Status: DC
  Administered 2023-02-05 – 2023-02-07 (×6): 500 mg via INTRAVENOUS
  Filled 2023-02-05 (×6): qty 100

## 2023-02-05 MED ORDER — CLOPIDOGREL BISULFATE 75 MG PO TABS
75.0000 mg | ORAL_TABLET | Freq: Every day | ORAL | Status: DC
  Administered 2023-02-05 – 2023-02-16 (×12): 75 mg via ORAL
  Filled 2023-02-05 (×12): qty 1

## 2023-02-05 MED ORDER — OXYCODONE-ACETAMINOPHEN 10-325 MG PO TABS: 1.0000 | ORAL_TABLET | ORAL | Status: DC | PRN

## 2023-02-05 MED ORDER — METOPROLOL TARTRATE 5 MG/5ML IV SOLN
5.0000 mg | Freq: Once | INTRAVENOUS | Status: AC
  Administered 2023-02-05: 2.5 mg via INTRAVENOUS
  Filled 2023-02-05: qty 5

## 2023-02-05 MED ORDER — POLYETHYLENE GLYCOL 3350 17 G PO PACK: 17.0000 g | PACK | Freq: Every day | ORAL | Status: DC | PRN

## 2023-02-05 MED ORDER — ACETAMINOPHEN 325 MG PO TABS
650.0000 mg | ORAL_TABLET | Freq: Four times a day (QID) | ORAL | Status: DC | PRN
  Administered 2023-02-05 – 2023-02-07 (×4): 650 mg via ORAL
  Filled 2023-02-05 (×4): qty 2

## 2023-02-05 MED ORDER — VANCOMYCIN HCL 1500 MG/300ML IV SOLN
1500.0000 mg | Freq: Once | INTRAVENOUS | Status: AC
  Administered 2023-02-05: 1500 mg via INTRAVENOUS
  Filled 2023-02-05: qty 300

## 2023-02-05 MED ORDER — CHLORHEXIDINE GLUCONATE CLOTH 2 % EX PADS
6.0000 | MEDICATED_PAD | Freq: Every day | CUTANEOUS | Status: DC
  Administered 2023-02-05 – 2023-02-10 (×6): 6 via TOPICAL

## 2023-02-05 MED ORDER — SODIUM CHLORIDE 0.9 % IV SOLN
2.0000 g | Freq: Three times a day (TID) | INTRAVENOUS | Status: DC
  Administered 2023-02-05 – 2023-02-08 (×9): 2 g via INTRAVENOUS
  Filled 2023-02-05 (×9): qty 12.5

## 2023-02-05 MED ORDER — OXYCODONE-ACETAMINOPHEN 5-325 MG PO TABS
1.0000 | ORAL_TABLET | ORAL | Status: DC | PRN
  Administered 2023-02-05 (×2): 1 via ORAL
  Filled 2023-02-05: qty 1

## 2023-02-05 NOTE — Progress Notes (Signed)
[facility] Physician-Brief Progress Note Patient Name: Troy [name] DOB: 07-03-1960 MRN: [id number]   Date of Service  02/05/2023  HPI/Events of Note  Increasing oxygen needs this evening.  Patient not in distress.  No increased work of breathing.  Nurse feels he is breathing more shallow after pain medications.  He is awake and able to answer questions.  eICU Interventions  Cxr stat     Intervention Category Major Interventions: Hypoxemia - evaluation and management  Henry Russel, P 02/05/2023, 11:49 PM
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
## 2023-02-05 NOTE — Consult Note (Addendum)
WOC Nurse Consult Note: Reason for Consult:Ischemic ulcerations to the right foot, medial and lateral aspects. Followed by Dr. Lajoyce Corners in the community (last seen 6/24) and referred to Dr. Randie Heinz (7/24). Recently admitted for inpatient stay and discharged 01/31/23. Consult performed remotely following review of the EMR including ortho and Vascular notes and photographs. Wound type: ischemic Pressure Injury POA: N/A Measurement:Per nursing flow sheet Wound bed:see photodocumentation provided to EMR. Medial ulcer is clean, red, lateral wound with dark discoloration and nonviable tissue. Drainage (amount, consistency, odor) small serous Periwound: erythematous  Dressing procedure/placement/frequency: I have provided conservative topical care guidance for the care of these lesions using a daily soap and water cleanse, rinse and dry followed by painting the lesions with povidone-iodine solution and allowing it to air dry. When dry, the lesions are to be covered with dry gauze and secured with Kerlix roll gauze/paper tape. The foot is to be placed into a pressure redistribution heel boot for PI prevention.  WOC nursing team will not follow, but will remain available to this patient, the nursing and medical teams.  Please re-consult if needed.  Thank you for inviting [name] to participate in this patient's Plan of Care.  Ladona Mow, MSN, RN, CNS, GNP, Leda Min, [employer], [employer] phone:  [phone]
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
## 2023-02-05 NOTE — Progress Notes (Signed)
ANTICOAGULATION CONSULT NOTE  Pharmacy Consult for heparin  Indication: atrial fibrillation  Allergies  Allergen Reactions   Hydromorphone Hcl Er Anaphylaxis and Other (See Comments)    Allergic to DYE in extended-release tablet, can tolerate other forms of hydromorphone   Tapentadol Anaphylaxis, Swelling and Other (See Comments)    THROAT ANGIOEDEMA Nucynta [Tapentadol Hydrochloride]   Exalamide Other (See Comments)    UNSPECIFIED REACTION     Patient Measurements: Height: 5\' 11"  (180.3 cm) Weight: 76.3 kg (168 lb 3.4 oz) IBW/kg (Calculated) : 75.3   Vital Signs: Temp: 98 F (36.7 C) (08/17 1911) Temp Source: Oral (08/17 1911) BP: 124/85 (08/17 1800) Pulse Rate: 94 (08/17 1800)  Labs: Recent Labs    02/04/23 2000 02/04/23 2202 02/05/23 0328 02/05/23 1209 02/05/23 2037  HGB 15.5  --   --   --   --   HCT 43.7  --   --   --   --   PLT 320  --   --   --   --   HEPARINUNFRC  --   --   --  0.15* 0.34  CREATININE 0.72  --  0.55*  --   --   TROPONINIHS 12 15 12   --   --     Estimated Creatinine Clearance: 102 mL/min (A) (by C-G formula based on SCr of 0.55 mg/dL (L)).   Assessment: 61yom admitted with new Afib RVR attempted to rate control with diltiazem.  Hx preserved EF 70% 2022, recent PAD s/p amputation L AKA and VVS intervention R LE.  Pharmacy consulted to dose IV heparin.  Heparin level therapeutic at 0.34 units/mL on 1450 units/hr.  No issue nor bleeding reported.  Noted history of hematuria in June and November of 2023.  Goal of Therapy:  Heparin level 0.3-0.7 units/ml Monitor platelets by anticoagulation protocol: Yes   Plan:  Adjust heparin drip to 1500 units/hr to remain therapeutic Confirmatory heparin level with AM labs Daily heparin level and CBC Monitor closely for s/sx of bleeding  Thank you for involving pharmacy in this patient's care.  Enos Fling, PharmD, BCPS  Clinical Pharmacist 02/05/2023 9:18 PM

## 2023-02-05 NOTE — Progress Notes (Signed)
[facility] ADULT ICU REPLACEMENT PROTOCOL   The patient does apply for the [facility] Adult ICU Electrolyte Replacment Protocol based on the criteria listed below:   1.Exclusion criteria: TCTS, ECMO, Dialysis, and Myasthenia Gravis patients 2. Is GFR >/= 30 ml/min? Yes.    Patient's GFR today is >60 3. Is SCr </= 2? Yes.   Patient's SCr is 0.55 mg/dL 4. Did SCr increase >/= 0.5 in 24 hours? No. 5.Pt's weight >40kg  Yes.   6. Abnormal electrolyte(s): Mg = 1.2, Phos = 2.0  7. Electrolytes replaced per protocol 8.  Call MD STAT for K+ </= 2.5, Phos </= 1, or Mag </= 1 Physician:  Warrick Parisian, eMD   Faye Ramsay 02/05/2023 4:43 AM

## 2023-02-05 NOTE — Progress Notes (Addendum)
eLink Physician-Brief Progress Note Patient Name: Troy [name] DOB: 06-Mar-1961 MRN: [id number]   Date of Service  02/05/2023  HPI/Events of Note  Patient admitted with narrow complex tachycardia with RVR in the context of  profound hypokalemia and a history of HFpEF, he has a recent history of vascular stent placement and foot ulcer.  eICU Interventions  New Patient Evaluation. Aggressive K+ replacement, check magnesium level.        Thomasene Lot Bunnie Rehberg 02/05/2023, 12:31 AM

## 2023-02-05 NOTE — Progress Notes (Signed)
ANTICOAGULATION CONSULT NOTE  Pharmacy Consult for heparin  Indication: atrial fibrillation  Allergies  Allergen Reactions   Hydromorphone Hcl Er Anaphylaxis and Other (See Comments)    Allergic to DYE in extended-release tablet, can tolerate other forms of hydromorphone   Tapentadol Anaphylaxis, Swelling and Other (See Comments)    THROAT ANGIOEDEMA Nucynta [Tapentadol Hydrochloride]   Exalamide Other (See Comments)    UNSPECIFIED REACTION     Patient Measurements: Height: 5\' 11"  (180.3 cm) Weight: 76.3 kg (168 lb 3.4 oz) IBW/kg (Calculated) : 75.3   Vital Signs: Temp: 98.6 F (37 C) (08/17 1148) Temp Source: Oral (08/17 1148) BP: 102/75 (08/17 1100) Pulse Rate: 82 (08/17 1100)  Labs: Recent Labs    02/04/23 2000 02/04/23 2202 02/05/23 0328 02/05/23 1209  HGB 15.5  --   --   --   HCT 43.7  --   --   --   PLT 320  --   --   --   HEPARINUNFRC  --   --   --  0.15*  CREATININE 0.72  --  0.55*  --   TROPONINIHS 12 15 12   --     Estimated Creatinine Clearance: 102 mL/min (A) (by C-G formula based on SCr of 0.55 mg/dL (L)).   Assessment: 61yom admitted with new Afib RVR attempted to rate control with diltiazem.  Hx preserved EF 70% 2022, recent PAD s/p amputation L AKA and VVS intervention R LE.  Pharmacy consulted to dose IV heparin.  Heparin level sub-therapeutic at 0.15 units/mL on 1150 units/hr.  No issue nor bleeding reported.  Noted history of hematuria in June and November of 2023.  Goal of Therapy:  Heparin level 0.3-0.7 units/ml Monitor platelets by anticoagulation protocol: Yes   Plan:  Heparin 2000 units IV bolus, then Increase heparin drip to 1450 units/hr   Check 6 hr heparin level Daily heparin level and CBC Monitor closely for s/sx of bleeding  Brigit Doke D. Laney Potash, PharmD, BCPS, BCCCP 02/05/2023, 1:01 PM
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
## 2023-02-05 NOTE — Progress Notes (Signed)
Phlebotomy called and made aware of stat lab orders and that pt is lab draw. Waiting on lab to come draw stat labs.

## 2023-02-05 NOTE — Progress Notes (Signed)
Pharmacy Antibiotic Note  Troy [name] is a 62 y.o. male admitted on 02/04/2023 with new Afib and hypotension.  Pharmacy has been consulted for vancomycin and cefepime dosing for foot wound infection and rule out osteomyelitis.  Patient is also started on Flagyl.  Renal function stable, afebrile, WBC elevated at 20.7, lactate normalized.  Plan: Vanc 1500mg  IV x 1, then 1250mg  IV Q12H for AUC 511 using SCr 0.8 Cefepime 2gm IV Q8H Flagyl 500mg  IV Q12H Monitor renal fxn, clinical progress, vanc levels as indicated F/u imaging  Height: 5\' 11"  (180.3 cm) Weight: 76.3 kg (168 lb 3.4 oz) IBW/kg (Calculated) : 75.3  Temp (24hrs), Avg:98 F (36.7 C), Min:97.6 F (36.4 C), Max:98.3 F (36.8 C)  Recent Labs  Lab 01/31/23 1307 02/04/23 2000 02/05/23 0328 02/05/23 0721  WBC  --  20.7*  --   --   CREATININE 0.50* 0.72 0.55*  --   LATICACIDVEN  --   --  2.5* 1.7    Estimated Creatinine Clearance: 102 mL/min (A) (by C-G formula based on SCr of 0.55 mg/dL (L)).    Allergies  Allergen Reactions   Hydromorphone Hcl Er Anaphylaxis and Other (See Comments)    Allergic to DYE in extended-release tablet, can tolerate other forms of hydromorphone   Tapentadol Anaphylaxis, Swelling and Other (See Comments)    THROAT ANGIOEDEMA Nucynta [Tapentadol Hydrochloride]   Exalamide Other (See Comments)    UNSPECIFIED REACTION     Vanc 8/17 >> Cefepime 8/17 >> Flagyl 8/17 >>  8/17 MRSA PCR - negative  Troy [name] Potash, PharmD, BCPS, BCCCP 02/05/2023, 10:03 AM

## 2023-02-05 NOTE — H&P (Signed)
NAME:  Troy [name], MRN:  [id number], DOB:  21-May-1961, LOS: 1 ADMISSION DATE:  02/04/2023, CONSULTATION DATE: 8/17 REFERRING MD: Dr. Estell Harpin EDP, CHIEF COMPLAINT: Atrial tachycardia, hypotension  History of Present Illness:  62 year old male with past medical history as below, which is significant for uncontrolled diabetes, peripheral vascular disease status post stenting of the right femoral artery on 8/12, cirrhosis, COPD, and HFpEF.  The patient is not very forthcoming with medical history, however, his wife is able to provide some recent history.  He has not been feeling well for the last couple of days and not acting normally.  She noticed he had not been smoking cigarettes very frequently and not eating very well.  She was checking his blood pressure when she noted that his heart rate was quite high and for this reason presented to [facility] emergency department in the evening hours of 8/16.  Upon arrival to the emergency department he was noted to be in a narrow complex tachycardia.  He was given adenosine which did sufficiently slow his rate down long enough to demonstrate underlying atrial flutter.  He was treated with metoprolol without improvement.  Diltiazem bolus and infusion were initiated, however, he then became hypotensive and diltiazem was stopped.  Cardiology was consulted by the EDP who recommended digoxin IV bolus which also was not effective.  The patient was transferred to [facility] for cardiology evaluation and ICU admission in the setting of hypotension.  PCCM was asked to admit.  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   Significant Hospital Events: Including procedures, antibiotic start and stop dates in addition to other pertinent events   8/17 admit for refractory atrial flutter  Interim History / Subjective:    Objective   Blood pressure 109/69, pulse (!) 126, temperature 98.3 F (36.8 C), temperature source Oral, resp. rate 18, height 5\' 11"  (1.803 m), weight 76.3 kg, SpO2 96%.        Intake/Output Summary (Last 24 hours) at 02/05/2023 0024 Last data filed at 02/04/2023 2246 Gross per 24 hour  Intake 2589.62 ml  Output --  Net 2589.62 ml   Filed Weights   02/04/23 2000 02/05/23 0004  Weight: 79.4 kg 76.3 kg    Examination: General: Thin elderly appearing male in NAD HENT: [place]/AT, PERRL, no JVD Lungs: Clear bilateral breath sounds Cardiovascular: Tachy, irregularly irregular. No MRG Abdomen: Soft, non-tender, non-distended  Extremities: L AKA, R toe amputations and wounds on both lateral and medial aspects of the R foot.  Neuro: Alert, oriented, non-focal   Resolved Hospital Problem list     Assessment & Plan:   Atrial flutter with RVR - Admit to ICU - Telemetry monitoring - Failed adenosine, BB, diltiazem, digoxin bolus, and amiodarone bolus/infusion - IV fluid - PRN metoprolol - Amiodarone infusion ongoing - Cardiology consulted - Correcting electrolytes  Hypotension: improved once diltiazem discontinued - LR infusion - holding diltiazem  DM: poorly controlled - CBG monitoring and SSI -  Check hemoglobin A1c  Hypokalemia  - replacing potassium - Check magnesium  Severe LVH PAD: s/p SFA stent placement 8/12 - continue aspirin and plavix - Continue  crestor  Cirrhosis: LFT ok, tBili 1.3 - Supportive care  Anion gap acidosis - check lactic - check beta hydroxybutyrate, UA   Chronic lower extremity pain - takes percocet at home, will continue   Best Practice (right click and "Reselect all SmartList Selections" daily)   Diet/type: NPO w/ oral meds DVT prophylaxis: systemic heparin GI prophylaxis: N/A Lines: N/A Foley:  N/A Code Status:  full code Last date of multidisciplinary goals of care discussion [  ]  Labs   CBC: Recent Labs  Lab 01/31/23 1307 02/04/23 2000  WBC  --  20.7*  NEUTROABS  --  16.6*  HGB 17.0 15.5  HCT 50.0 43.7  MCV  --  80.6  PLT  --  320    Basic Metabolic Panel: Recent Labs  Lab 01/31/23 1307 02/04/23 2000  NA 137 128*  K 3.1* 2.5*  CL 96* 87*  CO2  --  25  GLUCOSE 323* 428*  BUN 12 8  CREATININE 0.50* 0.72  CALCIUM  --  8.7*   GFR: Estimated Creatinine Clearance: 102 mL/min (by C-G formula based on SCr of 0.72 mg/dL). Recent Labs  Lab 02/04/23 2000  WBC 20.7*    Liver Function Tests: Recent Labs  Lab 02/04/23 2000  AST 15  ALT 13  ALKPHOS 93  BILITOT 1.3*  PROT 7.6  ALBUMIN 3.7   No results for input(s): "LIPASE", "AMYLASE" in the last 168 hours. No results for input(s): "AMMONIA" in the last 168 hours.  ABG    Component Value Date/Time   HCO3 33.3 (H) 12/12/2021 2018   TCO2 29 01/31/2023 1307   O2SAT 45 12/12/2021 2018     Coagulation Profile: No results for input(s): "INR", "PROTIME" in the last 168 hours.  Cardiac Enzymes: No results for input(s): "CKTOTAL", "CKMB", "CKMBINDEX", "TROPONINI" in the last 168 hours.  HbA1C: Hgb A1c MFr Bld  Date/Time Value Ref Range Status  08/18/2021 04:34 AM 10.8 (H) 4.8 - 5.6 % Final    Comment:    (NOTE) Pre diabetes:          5.7%-6.4%  Diabetes:              >6.4%  Glycemic control for   <7.0% adults with diabetes   01/21/2021 08:33 AM 12.3 (H) 4.8 - 5.6 % Final    Comment:    (NOTE) Pre diabetes:           5.7%-6.4%  Diabetes:              >6.4%  Glycemic control for   <7.0% adults with diabetes     CBG: Recent Labs  Lab 01/31/23 1547 02/04/23 1959 02/05/23 0001  GLUCAP 316* 432* 366*    Review of Systems:   Bolds are positive  Constitutional: weight loss, gain, night sweats, Fevers, chills, fatigue .  HEENT: headaches, Sore throat, sneezing, nasal congestion, post nasal drip, Difficulty swallowing, Tooth/dental problems, visual complaints visual changes, ear ache CV:  chest pain, radiates:,Orthopnea, PND, swelling in lower extremities, dizziness, palpitations, syncope.  GI  heartburn, indigestion, abdominal pain, nausea, vomiting, diarrhea, change in bowel habits, loss of appetite, bloody stools.  Resp: cough, productive: , hemoptysis, dyspnea, chest pain, pleuritic.  Skin: rash or itching or icterus GU: dysuria, change in color of urine, urgency or frequency. flank pain, hematuria  MS:  R foot pain or swelling. decreased range of motion  Psych: change in mood or affect. depression or anxiety.  Neuro: difficulty with speech, weakness, numbness, ataxia     Past Medical History:  He,  has a past medical history of Acute renal failure (HCC), Anxiety and depression, Chronic diastolic CHF (congestive heart failure) (HCC), Cirrhosis of liver (HCC), COPD (chronic obstructive pulmonary disease) (HCC), Diabetic ulcer of left foot (HCC), DM2 (diabetes mellitus, type 2) (HCC), Dysrhythmia, Family history of early CAD, GERD (gastroesophageal reflux disease), History of amputation of foot (HCC), History of cardiac catheterization, History of kidney stones, History of nuclear stress test, HLD (hyperlipidemia), HTN (hypertension), BKA, 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   Surgical History:   Past Surgical History:  Procedure Laterality Date   ABDOMINAL AORTOGRAM W/LOWER EXTREMITY N/A 01/31/2023    Procedure: ABDOMINAL AORTOGRAM W/LOWER EXTREMITY;  Surgeon: Maeola Harman, MD;  Location: [facility] INVASIVE CV LAB;  Service: Cardiovascular;  Laterality: N/A;   AMPUTATION Left 01/03/2018   Procedure: LEFT MIDFOOT AMPUTATION/REVISION MIDAMPUTAION;  Surgeon: Kathryne Hitch, MD;  Location: [facility] OR;  Service: Orthopedics;  Laterality: Left;   AMPUTATION Left 01/25/2018   Procedure: LEFT BELOW KNEE AMPUTATION;  Surgeon: Nadara Mustard, MD;  Location: [facility] OR;  Service: Orthopedics;  Laterality: Left;   AMPUTATION Left 01/21/2021   Procedure: LEFT ABOVE KNEE AMPUTATION;  Surgeon: Nadara Mustard, MD;  Location: [facility] OR;  Service: Orthopedics;  Laterality: Left;   AMPUTATION TOE Right 07/17/2019   Procedure: AMPUTATION RIGHT FOOT 2ND TOE;  Surgeon: Kathryne Hitch, MD;  Location: [facility];  Service: Orthopedics;  Laterality: Right;   APPLICATION OF WOUND VAC Left 01/21/2021   Procedure: APPLICATION OF WOUND VAC;  Surgeon: Nadara Mustard, MD;  Location: [facility] OR;  Service: Orthopedics;  Laterality: Left;   BELOW KNEE LEG AMPUTATION Left 01/25/2018   CARDIAC CATHETERIZATION N/A 01/22/2016   Procedure: Left Heart Cath and Coronary Angiography;  Surgeon: Peter M Swaziland, MD;  Location: [facility] INVASIVE CV LAB;  Service: Cardiovascular;  Laterality: N/A;   FOOT AMPUTATION Bilateral    I & D EXTREMITY Left 12/15/2017   Procedure: IRRIGATION AND DEBRIDEMENT LEFT FOOT ULCER;  Surgeon: Kathryne Hitch, MD;  Location: [facility] ORS;  Service: Orthopedics;  Laterality: Left;   I & D EXTREMITY Left 07/25/2020   Procedure: LEFT BELOW KNEE AMPUTATION ABSCESS EXCISION AND SKIN GRAFT;  Surgeon: Nadara Mustard, MD;  Location: [facility] OR;  Service: Orthopedics;  Laterality: Left;   I & D EXTREMITY Left 08/22/2020   Procedure: DEBRIDEMENT LEFT BELOW KNEE AMPUTATION AND APPLY KERECIS SKIN GRAFT;  Surgeon: Nadara Mustard, MD;  Location: [facility] OR;  Service: Orthopedics;  Laterality: Left;   LITHOTRIPSY     PERIPHERAL  VASCULAR INTERVENTION  01/31/2023   Procedure: PERIPHERAL VASCULAR INTERVENTION;  Surgeon: Maeola Harman, MD;  Location: [facility] INVASIVE CV LAB;  Service: Cardiovascular;;   TENDON LENGTHENING Bilateral    calf   TONSILLECTOMY       Social History:   reports that he has been smoking cigarettes. He has a 40 pack-year smoking history. He has never used smokeless tobacco. He reports that he does not drink alcohol and does not use drugs.   Family History:  His family history includes Diabetes in his sister and sister; Heart attack (age of onset: 69) in his brother; Heart attack (age of onset: 71) in his brother; Hypertension in his brother  and sister; Leukemia (age of onset: 68) in his mother; Lung cancer (age of onset: 14) in his father; Other in his brother; Stroke in his sister.   Allergies Allergies  Allergen Reactions   Hydromorphone Hcl Er Anaphylaxis and Other (See Comments)    Allergic to DYE in extended-release tablet, can tolerate other forms of hydromorphone   Tapentadol Anaphylaxis, Swelling and Other (See Comments)    THROAT ANGIOEDEMA Nucynta [Tapentadol Hydrochloride]   Exalamide Other (See Comments)    UNSPECIFIED REACTION      Home Medications  Prior to Admission medications   Medication Sig Start Date End Date Taking? Authorizing Provider  aspirin EC 81 MG EC tablet Take 1 tablet (81 mg total) by mouth daily with breakfast. Swallow whole. Patient taking differently: Take 81 mg by mouth daily. Swallow whole. 08/28/21  Yes Azucena Fallen, MD  clopidogrel (PLAVIX) 75 MG tablet Take 1 tablet (75 mg total) by mouth daily. 01/31/23 01/31/24 Yes Maeola Harman, MD  mupirocin ointment (BACTROBAN) 2 % Apply 1 Application topically daily. 01/13/23  Yes [provider]  oxyCODONE-acetaminophen (PERCOCET) 10-325 MG tablet Take 1 tablet by mouth every 4 (four) hours as needed for pain. 04/28/22  Yes [provider]  rosuvastatin (CRESTOR) 10 MG  tablet Take 1 tablet (10 mg total) by mouth daily. 01/31/23 01/31/24 Yes Maeola Harman, MD     Critical care time: 42 mins     Joneen Roach, AGACNP-BC [facility]  See Amion for personal pager PCCM on call pager [phone] until 7pm. Please call Elink 7p-7a. [phone]  02/05/2023 12:54 AM

## 2023-02-06 ENCOUNTER — Other Ambulatory Visit (HOSPITAL_COMMUNITY): Payer: Medicare Other

## 2023-02-06 ENCOUNTER — Inpatient Hospital Stay (HOSPITAL_COMMUNITY): Payer: Medicare Other

## 2023-02-06 DIAGNOSIS — I4891 Unspecified atrial fibrillation: Secondary | ICD-10-CM | POA: Diagnosis not present

## 2023-02-06 LAB — GLUCOSE, CAPILLARY
Glucose-Capillary: 183 mg/dL — ABNORMAL HIGH (ref 70–99)
Glucose-Capillary: 159 mg/dL — ABNORMAL HIGH (ref 70–99)
Glucose-Capillary: 188 mg/dL — ABNORMAL HIGH (ref 70–99)
Glucose-Capillary: 241 mg/dL — ABNORMAL HIGH (ref 70–99)
Glucose-Capillary: 240 mg/dL — ABNORMAL HIGH (ref 70–99)
Glucose-Capillary: 167 mg/dL — ABNORMAL HIGH (ref 70–99)

## 2023-02-06 LAB — BASIC METABOLIC PANEL
Anion gap: 12 (ref 5–15)
Anion gap: 12 (ref 5–15)
BUN: 11 mg/dL (ref 8–23)
BUN: 10 mg/dL (ref 8–23)
CO2: 26 mmol/L (ref 22–32)
CO2: 23 mmol/L (ref 22–32)
Calcium: 8.1 mg/dL — ABNORMAL LOW (ref 8.9–10.3)
Calcium: 7.6 mg/dL — ABNORMAL LOW (ref 8.9–10.3)
Chloride: 93 mmol/L — ABNORMAL LOW (ref 98–111)
Chloride: 95 mmol/L — ABNORMAL LOW (ref 98–111)
Creatinine, Ser: 0.77 mg/dL (ref 0.61–1.24)
Creatinine, Ser: 0.64 mg/dL (ref 0.61–1.24)
GFR, Estimated: >60 mL/min (ref >60)
GFR, Estimated: >60 mL/min (ref >60)
Glucose, Bld: 185 mg/dL — ABNORMAL HIGH (ref 70–99)
Glucose, Bld: 186 mg/dL — ABNORMAL HIGH (ref 70–99)
Potassium: 3.1 mmol/L — ABNORMAL LOW (ref 3.5–5.1)
Potassium: 3.2 mmol/L — ABNORMAL LOW (ref 3.5–5.1)
Sodium: 131 mmol/L — ABNORMAL LOW (ref 135–145)
Sodium: 130 mmol/L — ABNORMAL LOW (ref 135–145)

## 2023-02-06 LAB — PROCALCITONIN: Procalcitonin: <0.1 ng/mL

## 2023-02-06 LAB — ECHOCARDIOGRAM COMPLETE
Area-P 1/2: 3.74 cm2
Height: 71 in
S' Lateral: 4.1 cm
Weight: 2719.59 oz

## 2023-02-06 LAB — HEPARIN LEVEL (UNFRACTIONATED): Heparin Unfractionated: 0.34 [IU]/mL (ref 0.30–0.70)

## 2023-02-06 LAB — CBC
HCT: 41.6 % (ref 39.0–52.0)
Hemoglobin: 14.1 g/dL (ref 13.0–17.0)
MCH: 27.8 pg (ref 26.0–34.0)
MCHC: 33.9 g/dL (ref 30.0–36.0)
MCV: 81.9 fL (ref 80.0–100.0)
Platelets: 225 10*3/uL (ref 150–400)
RBC: 5.08 MIL/uL (ref 4.22–5.81)
RDW: 12.9 % (ref 11.5–15.5)
WBC: 13.4 10*3/uL — ABNORMAL HIGH (ref 4.0–10.5)
nRBC: 0 % (ref 0.0–0.2)

## 2023-02-06 LAB — PHOSPHORUS: Phosphorus: 2.9 mg/dL (ref 2.5–4.6)

## 2023-02-06 LAB — MAGNESIUM
Magnesium: 1.5 mg/dL — ABNORMAL LOW (ref 1.7–2.4)
Magnesium: 2.4 mg/dL (ref 1.7–2.4)

## 2023-02-06 LAB — TSH: TSH: 3.259 u[IU]/mL (ref 0.350–4.500)

## 2023-02-06 MED ORDER — POTASSIUM CHLORIDE 10 MEQ/100ML IV SOLN
10.0000 meq | INTRAVENOUS | Status: AC
  Administered 2023-02-06 (×4): 10 meq via INTRAVENOUS
  Filled 2023-02-06 (×4): qty 100

## 2023-02-06 MED ORDER — FUROSEMIDE 10 MG/ML IJ SOLN
40.0000 mg | Freq: Once | INTRAMUSCULAR | Status: AC
  Administered 2023-02-06: 40 mg via INTRAVENOUS
  Filled 2023-02-06: qty 4

## 2023-02-06 MED ORDER — MAGNESIUM SULFATE 4 GM/100ML IV SOLN
4.0000 g | Freq: Once | INTRAVENOUS | Status: AC
  Administered 2023-02-06: 4 g via INTRAVENOUS
  Filled 2023-02-06: qty 100

## 2023-02-06 MED ORDER — POTASSIUM CHLORIDE CRYS ER 20 MEQ PO TBCR
20.0000 meq | EXTENDED_RELEASE_TABLET | ORAL | Status: AC
  Administered 2023-02-06 (×2): 20 meq via ORAL
  Filled 2023-02-06 (×2): qty 1

## 2023-02-06 MED ORDER — AMIODARONE HCL 200 MG PO TABS
400.0000 mg | ORAL_TABLET | Freq: Two times a day (BID) | ORAL | Status: DC
  Administered 2023-02-06 – 2023-02-08 (×6): 400 mg via ORAL
  Filled 2023-02-06 (×6): qty 2

## 2023-02-06 NOTE — Progress Notes (Signed)
ANTICOAGULATION CONSULT NOTE  Pharmacy Consult for heparin  Indication: atrial fibrillation  Allergies  Allergen Reactions   Hydromorphone Hcl Er Anaphylaxis and Other (See Comments)    Allergic to DYE in extended-release tablet, can tolerate other forms of hydromorphone   Tapentadol Anaphylaxis, Swelling and Other (See Comments)    THROAT ANGIOEDEMA Nucynta [Tapentadol Hydrochloride]   Exalamide Other (See Comments)    UNSPECIFIED REACTION     Patient Measurements: Height: 5\' 11"  (180.3 cm) Weight: 77.1 kg (169 lb 15.6 oz) IBW/kg (Calculated) : 75.3   Vital Signs: Temp: 98.7 F (37.1 C) (08/18 0722) Temp Source: Oral (08/18 0722) BP: 151/95 (08/18 0630) Pulse Rate: 91 (08/18 0630)  Labs: Recent Labs    02/04/23 2000 02/04/23 2202 02/05/23 0328 02/05/23 1209 02/05/23 2037 02/06/23 0235 02/06/23 0241 02/06/23 0739  HGB 15.5  --   --   --   --   --  14.1  --   HCT 43.7  --   --   --   --   --  41.6  --   PLT 320  --   --   --   --   --  225  --   HEPARINUNFRC  --   --   --  0.15* 0.34 0.34  --   --   CREATININE 0.72  --  0.55*  --   --   --  0.77 0.64  TROPONINIHS 12 15 12   --   --   --   --   --     Estimated Creatinine Clearance: 102 mL/min (by C-G formula based on SCr of 0.64 mg/dL).   Assessment: 61yom admitted with new Afib RVR attempted to rate control with diltiazem.  Hx preserved EF 70% 2022, recent PAD s/p amputation L AKA and VVS intervention R LE.  Pharmacy consulted to dose IV heparin.  Heparin level therapeutic and stable.  CBC stable; no bleeding reported.  Noted history of hematuria in June and November of 2023.  Goal of Therapy:  Heparin level 0.3-0.7 units/ml Monitor platelets by anticoagulation protocol: Yes   Plan:  Continue heparin drip at 1500 units/hr   Daily heparin level and CBC Monitor closely for s/sx of bleeding  Alenah Sarria D. Laney Potash, PharmD, BCPS, BCCCP 02/06/2023, 10:27 AM

## 2023-02-06 NOTE — Progress Notes (Signed)
eLink Physician-Brief Progress Note Patient Name: ZACKARIE MASSARI DOB: [birth year] MRN: [id number]   Date of Service  02/06/2023  HPI/Events of Note  Mild edema noted on cxr with increasing O2 requirement  eICU Interventions  Dose of lasix IV x 1     Intervention Category Major Interventions: Hypoxemia - evaluation and management  Henry Russel, P 02/06/2023, 12:57 AM

## 2023-02-06 NOTE — Progress Notes (Signed)
Progress Note   Patient: Troy [name] ZDG:[id number] DOB: 09-19-1960 DOA: 02/04/2023     2 DOS: the patient was seen and examined on 02/06/2023   Brief hospital course: 62 year old man with a history of uncontrolled diabetes, peripheral vascular disease (right femoral artery stent 8/12) and ischemic right foot wounds, cirrhosis, COPD, HFpEF. Presented to [facility] ED 02/04/2023 with hyperglycemia, was found to be in a narrow complex tachycardia that was atrial flutter. Received adenosine, metoprolol, diltiazem and digoxin bolus. He remained tachycardic and developed hypotension. He was transferred to [facility] for further evaluation.   8/18: transferred to floor. Overnight on HFNC at 12 L, given additional lasix x 2, on 3 L in the afternoon   Assessment and Plan:  Atrial flutter with RVR, resolved Presented with narrow-complex tachycardia failed adenosine, BB, diltiazem, digoxin bolus, and amiodarone bolus/infusionhypotensive with Cardizem. Converted to SR. Trigger is likely due to infection as below.  -Continuous telemetry -cardiology following, appreciate assistance -Continue amiodarone IV to 400 mg BID x 7 days and then 200 mg maintenance  -heparin IV for A/C in case any procedures planned  -Optimize electrolytes  Concern for osteomyelitis with right foot wounds,  POA  Presented with wounds to right foot with purulent drainage and odor, xray with c/f OM.  -Start empiric cefepime, Flagyl and vancomycin -consulted ortho, will see (primary ortho is Dr. Lajoyce Corners, has been following) -will d/w ortho before involving ID, continue abx as above  AHRF Hx of COPD Presented on RA, overnight 8/17-8/18 while in ICU was on HFNC up to 12 L. Given lasix with improvement on 3 L O2 due to suspected pulm edema.  -s/p lasix IV -continue lasix prn    HFpEF Echo August 2022 EF 70 to 75% with moderate LVH and grade 1 diastolic dysfunction, repeat this hospitalization with LVEF >70%, grade II DD.  -consider MRA, SGLT2i    Severe PAD with history of left AKA recent S FA stenting S/p SFA stent placement 8/12 -Continue aspirin and Plavix -Continue statin   DM Poorly controlled, hemoglobin A1c 11.7 02/05/2023.  -SSI -CBG checks ACHS -Start long acting insulin   Hyponatremia Suspected hypervolemic s/p diuresis.  -bmp daily  Hypokalemia-replete   Cirrhosis -LFT ok, tBili 1.3 P: Avoid hepatotoxins Supportive care  Diet/type: regular DVT prophylaxis: systemic heparin GI prophylaxis: N/A Lines: N/A Foley:  N/A Code Status:  full code      Subjective: Pt reports no chest pain, chest pressure, palpitations or other notable symptoms.   Physical Exam: Vitals:   02/06/23 1100 02/06/23 1109 02/06/23 1200 02/06/23 1350  BP: (!) 154/100  (!) 145/83 123/81  Pulse: 97  91 86  Resp: (!) 28  (!) 27 (!) 22  Temp:  98.3 F (36.8 C)    TempSrc:  Oral    SpO2: 98%  99% 98%  Weight:      Height:       Physical Exam Vitals and nursing note reviewed.  Constitutional:      General: He is not in acute distress.    Appearance: He is not diaphoretic.  HENT:     Head: Atraumatic.  Eyes:     Pupils: Pupils are equal, round, and reactive to light.  Cardiovascular:     Rate and Rhythm: Regular rhythm. Tachycardia present.     Pulses: Normal pulses.     Heart sounds: No murmur heard. Pulmonary:     Effort: Pulmonary effort is normal. No respiratory distress.     Breath sounds: Normal breath sounds.  No rales.  Abdominal:     General: Abdomen is flat. There is no distension.     Palpations: Abdomen is soft.     Tenderness: There is no abdominal tenderness. There is no rebound.  Musculoskeletal:     Right lower leg: No edema.     Left lower leg: No edema.     Comments: Right lower extremity wrapped  Skin:    General: Skin is warm and dry.     Capillary Refill: Capillary refill takes less than 2 seconds.  Neurological:     Mental Status: He is alert and oriented to person, place, and time. Mental  status is at baseline.  Psychiatric:        Mood and Affect: Mood normal.     Data Reviewed:      Latest Ref Rng & Units 02/06/2023    2:41 AM 02/04/2023    8:00 PM 01/31/2023    1:07 PM  CBC  WBC 4.0 - 10.5 K/uL 13.4  20.7    Hemoglobin 13.0 - 17.0 g/dL 16.1  09.6  04.5   Hematocrit 39.0 - 52.0 % 41.6  43.7  50.0   Platelets 150 - 400 K/uL 225  320        Latest Ref Rng & Units 02/06/2023    7:39 AM 02/06/2023    2:41 AM 02/05/2023    3:28 AM  CMP  Glucose 70 - 99 mg/dL 409  811  914   BUN 8 - 23 mg/dL 10  11  7    Creatinine 0.61 - 1.24 mg/dL 7.82  9.56  2.13   Sodium 135 - 145 mmol/L 130  131  131   Potassium 3.5 - 5.1 mmol/L 3.2  3.1  3.5   Chloride 98 - 111 mmol/L 95  93  93   CO2 22 - 32 mmol/L 23  26  25    Calcium 8.9 - 10.3 mg/dL 7.6  8.1  8.0      Family Communication: none at bedide  Disposition: Status is: Inpatient Remains inpatient appropriate because: on going iv medications  Planned Discharge Destination: Home and Skilled nursing facility    Time spent: 50 minutes  Author: Charolotte Eke, MD 02/06/2023 2:33 PM  For on call review [email or web address].

## 2023-02-06 NOTE — Progress Notes (Signed)
[facility] ADULT ICU REPLACEMENT PROTOCOL   The patient does apply for the [facility] Adult ICU Electrolyte Replacment Protocol based on the criteria listed below:   1.Exclusion criteria: TCTS, ECMO, Dialysis, and Myasthenia Gravis patients 2. Is GFR >/= 30 ml/min? Yes.    Patient's GFR today is >60 3. Is SCr </= 2? Yes.   Patient's SCr is 0.77 mg/dL 4. Did SCr increase >/= 0.5 in 24 hours? No. 5.Pt's weight >40kg  Yes.   6. Abnormal electrolyte(s): K+ 3.1  Mag 1.5  7. Electrolytes replaced per protocol 8.  Call MD STAT for K+ </= 2.5, Phos </= 1, or Mag </= 1 Physician:  Dr Henry Russel  Lolita Lenz 02/06/2023 4:16 AM
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
## 2023-02-07 ENCOUNTER — Other Ambulatory Visit (HOSPITAL_COMMUNITY): Payer: Self-pay

## 2023-02-07 DIAGNOSIS — I4891 Unspecified atrial fibrillation: Secondary | ICD-10-CM | POA: Diagnosis not present

## 2023-02-07 DIAGNOSIS — M86471 Chronic osteomyelitis with draining sinus, right ankle and foot: Secondary | ICD-10-CM

## 2023-02-07 DIAGNOSIS — D6869 Other thrombophilia: Secondary | ICD-10-CM

## 2023-02-07 DIAGNOSIS — I739 Peripheral vascular disease, unspecified: Secondary | ICD-10-CM | POA: Diagnosis not present

## 2023-02-07 DIAGNOSIS — I5032 Chronic diastolic (congestive) heart failure: Secondary | ICD-10-CM

## 2023-02-07 DIAGNOSIS — I480 Paroxysmal atrial fibrillation: Secondary | ICD-10-CM | POA: Diagnosis not present

## 2023-02-07 DIAGNOSIS — Z89612 Acquired absence of left leg above knee: Secondary | ICD-10-CM

## 2023-02-07 HISTORY — DX: Chronic osteomyelitis with draining sinus, right ankle and foot: M86.471

## 2023-02-07 LAB — GLUCOSE, CAPILLARY
Glucose-Capillary: 143 mg/dL — ABNORMAL HIGH (ref 70–99)
Glucose-Capillary: 156 mg/dL — ABNORMAL HIGH (ref 70–99)
Glucose-Capillary: 154 mg/dL — ABNORMAL HIGH (ref 70–99)
Glucose-Capillary: 227 mg/dL — ABNORMAL HIGH (ref 70–99)
Glucose-Capillary: 192 mg/dL — ABNORMAL HIGH (ref 70–99)
Glucose-Capillary: 193 mg/dL — ABNORMAL HIGH (ref 70–99)

## 2023-02-07 LAB — CBC
HCT: 35.8 % — ABNORMAL LOW (ref 39.0–52.0)
Hemoglobin: 12.3 g/dL — ABNORMAL LOW (ref 13.0–17.0)
MCH: 28.3 pg (ref 26.0–34.0)
MCHC: 34.4 g/dL (ref 30.0–36.0)
MCV: 82.3 fL (ref 80.0–100.0)
Platelets: 230 10*3/uL (ref 150–400)
RBC: 4.35 MIL/uL (ref 4.22–5.81)
RDW: 12.8 % (ref 11.5–15.5)
WBC: 10.9 10*3/uL — ABNORMAL HIGH (ref 4.0–10.5)
nRBC: 0 % (ref 0.0–0.2)

## 2023-02-07 LAB — BASIC METABOLIC PANEL
Anion gap: 11 (ref 5–15)
Anion gap: 13 (ref 5–15)
BUN: 10 mg/dL (ref 8–23)
BUN: 10 mg/dL (ref 8–23)
CO2: 27 mmol/L (ref 22–32)
CO2: 26 mmol/L (ref 22–32)
Calcium: 7.9 mg/dL — ABNORMAL LOW (ref 8.9–10.3)
Calcium: 8.1 mg/dL — ABNORMAL LOW (ref 8.9–10.3)
Chloride: 94 mmol/L — ABNORMAL LOW (ref 98–111)
Chloride: 95 mmol/L — ABNORMAL LOW (ref 98–111)
Creatinine, Ser: 0.7 mg/dL (ref 0.61–1.24)
Creatinine, Ser: 0.63 mg/dL (ref 0.61–1.24)
GFR, Estimated: >60 mL/min (ref >60)
GFR, Estimated: >60 mL/min (ref >60)
Glucose, Bld: 156 mg/dL — ABNORMAL HIGH (ref 70–99)
Glucose, Bld: 185 mg/dL — ABNORMAL HIGH (ref 70–99)
Potassium: 3.1 mmol/L — ABNORMAL LOW (ref 3.5–5.1)
Potassium: 4 mmol/L (ref 3.5–5.1)
Sodium: 132 mmol/L — ABNORMAL LOW (ref 135–145)
Sodium: 134 mmol/L — ABNORMAL LOW (ref 135–145)

## 2023-02-07 LAB — HEMOGLOBIN AND HEMATOCRIT, BLOOD
HCT: 36.1 % — ABNORMAL LOW (ref 39.0–52.0)
Hemoglobin: 12.5 g/dL — ABNORMAL LOW (ref 13.0–17.0)

## 2023-02-07 LAB — MAGNESIUM
Magnesium: 1.5 mg/dL — ABNORMAL LOW (ref 1.7–2.4)
Magnesium: 1.8 mg/dL (ref 1.7–2.4)

## 2023-02-07 LAB — HEPARIN LEVEL (UNFRACTIONATED)
Heparin Unfractionated: 0.27 [IU]/mL — ABNORMAL LOW (ref 0.30–0.70)
Heparin Unfractionated: 0.35 [IU]/mL (ref 0.30–0.70)

## 2023-02-07 LAB — C-REACTIVE PROTEIN: CRP: 13 mg/dL — ABNORMAL HIGH (ref <1.0)

## 2023-02-07 LAB — PHOSPHORUS
Phosphorus: 2.6 mg/dL (ref 2.5–4.6)
Phosphorus: 2.1 mg/dL — ABNORMAL LOW (ref 2.5–4.6)

## 2023-02-07 LAB — SEDIMENTATION RATE: Sed Rate: 56 mm/h — ABNORMAL HIGH (ref 0–16)

## 2023-02-07 MED ORDER — MAGNESIUM SULFATE 2 GM/50ML IV SOLN: 2.0000 g | Freq: Once | INTRAVENOUS | Status: DC

## 2023-02-07 MED ORDER — POTASSIUM CHLORIDE 20 MEQ PO PACK: 80.0000 meq | PACK | Freq: Once | ORAL | Status: DC

## 2023-02-07 MED ORDER — MAGNESIUM SULFATE 2 GM/50ML IV SOLN
2.0000 g | INTRAVENOUS | Status: AC
  Administered 2023-02-07: 2 g via INTRAVENOUS
  Filled 2023-02-07: qty 50

## 2023-02-07 MED ORDER — POTASSIUM CHLORIDE 20 MEQ PO PACK
80.0000 meq | PACK | ORAL | Status: AC
  Administered 2023-02-07: 80 meq via ORAL
  Filled 2023-02-07: qty 4

## 2023-02-07 NOTE — Progress Notes (Signed)
Patient Name: Troy [name] Date of Encounter: 02/07/2023 [facility] Cardiologist: ([facility] [place])  Interval Summary  .    No cardiovascular complaints.  He was completely oblivious of the arrhythmia even when he had RVR.  Currently in sinus rhythm with frequent PACs.  Has had several bursts of atrial fibrillation overnight, all of them brief, still with RVR.  Vital Signs .    Vitals:   02/07/23 0300 02/07/23 0338 02/07/23 0400 02/07/23 0732  BP: 134/64  (!) 151/84   Pulse: 83  86   Resp: 18  16   Temp:  98.3 F (36.8 C)  97.8 F (36.6 C)  TempSrc:  Oral  Oral  SpO2: 93%  95%   Weight:      Height:        Intake/Output Summary (Last 24 hours) at 02/07/2023 0930 Last data filed at 02/07/2023 0400 Gross per 24 hour  Intake 937.14 ml  Output 2010 ml  Net -1072.86 ml      02/06/2023    1:28 AM 02/05/2023    5:00 AM 02/05/2023   12:04 AM  Last 3 Weights  Weight (lbs) 169 lb 15.6 oz 168 lb 3.4 oz 168 lb 3.4 oz  Weight (kg) 77.1 kg 76.3 kg 76.3 kg      Telemetry/ECG    Mostly normal sinus rhythm with frequent PACs and atrial couplets, occasional PVCs; a few brief bursts of paroxysmal atrial fibrillation with rapid ventricular response, generally lasting less than 15 minutes.- Personally Reviewed  ECG from 02/05/2023 shows atrial flutter with variable AV block  ECHOcardiogram 02/06/2023   1. Left ventricular ejection fraction, by estimation, is 70 to 75%. The  left ventricle has hyperdynamic function. The left ventricle has no  regional wall motion abnormalities. The left ventricular internal cavity  size was mildly dilated. There is mild  concentric left ventricular hypertrophy of the basal-septal segment. Left  ventricular diastolic parameters are consistent with Grade II diastolic  dysfunction (pseudonormalization). Elevated left ventricular end-diastolic  pressure.   2. Right ventricular systolic function is normal. The right ventricular  size is  normal.   3. Left atrial size was mildly dilated.   4. The mitral valve is normal in structure. Trivial mitral valve  regurgitation. No evidence of mitral stenosis.   5. The aortic valve is tricuspid. Aortic valve regurgitation is trivial.  Aortic valve sclerosis is present, with no evidence of aortic valve  stenosis.   6. The inferior vena cava is normal in size with greater than 50%  respiratory variability, suggesting right atrial pressure of 3 mmHg.    Physical Exam .   GEN: No acute distress.   Neck: No JVD Cardiac: RRR, no murmurs, rubs, or gallops.  Respiratory: Clear to auscultation bilaterally. GI: Soft, nontender, non-distended  MS: No edema  Assessment & Plan .     62 y.o. male with past medical history of chronic diastolic congestive heart failure, diabetes mellitus, hypertension, hyperlipidemia, peripheral vascular disease, prior left AKA, recent right SFA stent on January 31, 2023, tobacco abuse admitted with atrial fibrillation/atrial flutter.  Hypotension on diltiazem and is now on amiodarone.  Persistent atrial fibrillation: Now with brief episodes paroxysmal atrial fibrillation, marked reduction in the prevalence of arrhythmia on amiodarone.  Continue oral amiodarone.  Discussed the risk of drug interactions, liver problems, thyroid problems, photosensitivity, rare but serious risk of lung disorders, need for eye exam, etc.  Amiodarone remains the best choice for the time being since he  needed both rhythm and rate control and he did not tolerate conventional AV blocking agents for rate control due to hypotension. Anticoagulation: Plan for right foot surgery on Wednesday.  On intravenous heparin until then.  Once all surgical procedures have been completed and there is no bleeding risk we will switch to oral anticoagulant, probably Eliquis 5 mg twice daily.  History of diastolic CHF: currently appears euvolemic by physical exam.  History of cirrhosis: Watch LFTs  carefully on amiodarone.  Once beyond the acute illness, we can consider switching to an alternative antiarrhythmic, but choices are limited due to his comorbid conditions (probably only dofetilide is a reasonable alternative and it would not offer good rate control).  For questions or updates, please contact [facility] Please consult [email or web address] for contact info under        Signed, Thurmon Fair, MD
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
## 2023-02-07 NOTE — H&P (View-Only) (Signed)
ORTHOPAEDIC CONSULTATION  REQUESTING PHYSICIAN: Lanae Boast, MD  Chief Complaint: Worsening ulceration right foot fifth metatarsal head.  HPI: Troy [name] is a 62 y.o. male who presents with atrial flutter.  Patient most recently underwent femoral stent for peripheral vascular disease on the right lower extremity.  Stent placed 01/31/2023.  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  Past Medical History:  Diagnosis Date   Acute renal failure (HCC)    in setting of NSAID use and orthopedic surgery 2010   Anxiety and depression    Chronic diastolic CHF (congestive heart failure) (HCC)    a. Echo 6/17: severe conc LVH, vigorous EF, EF 65-70%, no dynamic obstruction, no RWMA, Gr 1 DD, mild TR  //  b. 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 LHC 2002: irregs  //  b. LHC in 8/17: no sig CAD, apical DK, hyperdynamic LV, LVEDP 28   History of kidney stones    History of nuclear stress test    a. Nuc 7/17: Overall, intermediate risk nuclear stress test secondary to small size of apical lateral defect and reduced ejection fraction.  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    Kidney calculi    Palpitations    PTSD (post-traumatic stress disorder)    Tobacco abuse    Past Surgical History:  Procedure Laterality Date   ABDOMINAL AORTOGRAM W/LOWER EXTREMITY N/A 01/31/2023   Procedure: ABDOMINAL AORTOGRAM W/LOWER EXTREMITY;  Surgeon: Maeola Harman, MD;  Location: [facility] INVASIVE CV LAB;  Service:  Cardiovascular;  Laterality: N/A;   AMPUTATION Left 01/03/2018   Procedure: LEFT MIDFOOT AMPUTATION/REVISION MIDAMPUTAION;  Surgeon: Kathryne Hitch, MD;  Location: [facility] OR;  Service: Orthopedics;  Laterality: Left;   AMPUTATION Left 01/25/2018   Procedure: LEFT BELOW KNEE AMPUTATION;  Surgeon: Nadara Mustard, MD;  Location: [facility] OR;  Service: Orthopedics;  Laterality: Left;   AMPUTATION Left 01/21/2021   Procedure: LEFT ABOVE KNEE AMPUTATION;  Surgeon: Nadara Mustard, MD;  Location: [facility] OR;  Service: Orthopedics;  Laterality: Left;   AMPUTATION TOE Right 07/17/2019   Procedure: AMPUTATION RIGHT FOOT 2ND TOE;  Surgeon: Kathryne Hitch, MD;  Location: [facility];  Service: Orthopedics;  Laterality: Right;   APPLICATION OF WOUND VAC Left 01/21/2021   Procedure: APPLICATION OF WOUND VAC;  Surgeon: Nadara Mustard, MD;  Location: [facility] OR;  Service: Orthopedics;  Laterality: Left;   BELOW KNEE LEG AMPUTATION Left 01/25/2018   CARDIAC CATHETERIZATION N/A 01/22/2016   Procedure: Left Heart Cath and Coronary Angiography;  Surgeon: Peter M Swaziland, MD;  Location: [facility] INVASIVE CV LAB;  Service: Cardiovascular;  Laterality: N/A;   FOOT AMPUTATION Bilateral    I & D EXTREMITY Left 12/15/2017   Procedure: IRRIGATION AND DEBRIDEMENT LEFT FOOT ULCER;  Surgeon: Kathryne Hitch, MD;  Location: [facility] ORS;  Service:  Orthopedics;  Laterality: Left;   I & D EXTREMITY Left 07/25/2020   Procedure: LEFT BELOW KNEE AMPUTATION ABSCESS EXCISION AND SKIN GRAFT;  Surgeon: Nadara Mustard, MD;  Location: [facility] OR;  Service: Orthopedics;  Laterality: Left;   I & D EXTREMITY Left 08/22/2020   Procedure: DEBRIDEMENT LEFT BELOW KNEE AMPUTATION AND APPLY KERECIS SKIN GRAFT;  Surgeon: Nadara Mustard, MD;  Location: [facility] OR;  Service: Orthopedics;  Laterality: Left;   LITHOTRIPSY     PERIPHERAL VASCULAR INTERVENTION  01/31/2023   Procedure: PERIPHERAL VASCULAR INTERVENTION;  Surgeon: Maeola Harman, MD;  Location:  [facility] INVASIVE CV LAB;  Service: Cardiovascular;;   TENDON LENGTHENING Bilateral    calf   TONSILLECTOMY     Social History   Socioeconomic History   Marital status: Married    Spouse name: Not on file   Number of children: 1   Years of education: Not on file   Highest education level: Not on file  Occupational History   Occupation: DISABLED  Tobacco Use   Smoking status: Every Day    Current packs/day: 1.00    Average packs/day: 1 pack/day for 40.0 years (40.0 ttl pk-yrs)    Types: Cigarettes   Smokeless tobacco: Never  Vaping Use   Vaping status: Never Used  Substance and Sexual Activity   Alcohol use: No   Drug use: No   Sexual activity: Not on file  Other Topics Concern   Not on file  Social History Narrative   Not on file   Social Determinants of Health   Financial Resource Strain: Low Risk  (04/09/2021)   Received from [facility], [facility]   Overall Financial Resource Strain (CARDIA)    Difficulty of Paying Living Expenses: Not hard at all  Food Insecurity: No Food Insecurity (04/09/2021)   Received from [facility], [facility]   Hunger Vital Sign    Worried About Running Out of Food in the Last Year: Never true    Ran Out of Food in the Last Year: Never true  Transportation Needs: No Transportation Needs (04/09/2021)   Received from [employer], [facility]   PRAPARE - Transportation    Lack of Transportation (Medical): No    Lack of Transportation (Non-Medical): No  Physical Activity: Insufficiently Active (04/09/2021)   Received from [facility], [facility]   Exercise Vital Sign    Days of Exercise per Week: 7 days    Minutes of Exercise per Session: 10 min  Stress: No Stress Concern Present (04/09/2021)   Received from [facility], [facility] - Occupational Stress Questionnaire    Feeling of Stress : Not at all  Social Connections: Unknown (10/22/2021)   Received from [facility],  [facility]   Social Network    Social Network: Not on file   Family History  Problem Relation Age of Onset   Leukemia Mother 20       died   Lung cancer Father 75       died   Heart attack Brother 23   Heart attack Brother 95   Hypertension Brother        X3   Hypertension Sister        X2   Diabetes Sister    Stroke Sister    Diabetes Sister    Other Brother        Set designer accident   - negative except otherwise stated in the family history section  Allergies  Allergen Reactions   Hydromorphone Hcl Er Anaphylaxis and Other (See Comments)    Allergic to DYE in extended-release tablet, can tolerate other forms of hydromorphone   Tapentadol Anaphylaxis, Swelling and Other (See Comments)    THROAT ANGIOEDEMA Nucynta [Tapentadol Hydrochloride]   Exalamide Other (See Comments)    UNSPECIFIED REACTION    Prior to Admission medications   Medication Sig Start Date End Date Taking? Authorizing Provider  aspirin EC 81 MG EC tablet Take 1 tablet (81 mg total) by mouth daily with breakfast. Swallow whole. Patient taking differently: Take 81 mg by mouth daily. Swallow whole. 08/28/21  Yes Azucena Fallen, MD  clopidogrel (PLAVIX) 75 MG tablet Take 1 tablet (75 mg total) by mouth daily. 01/31/23 01/31/24 Yes Maeola Harman, MD  mupirocin ointment (BACTROBAN) 2 % Apply 1 Application topically daily. 01/13/23  Yes [provider]  oxyCODONE-acetaminophen (PERCOCET) 10-325 MG tablet Take 1 tablet by mouth every 4 (four) hours as needed for pain. 04/28/22  Yes [provider]  rosuvastatin (CRESTOR) 10 MG tablet Take 1 tablet (10 mg total) by mouth daily. 01/31/23 01/31/24 Yes Maeola Harman, MD   ECHOCARDIOGRAM COMPLETE  Result Date: 02/06/2023    ECHOCARDIOGRAM REPORT   Patient Name:   ROSCOE MILETO [facility] Date of Exam: 02/06/2023 Medical Rec #:  [id number]     Height:       71.0 in Accession #:    [id number]    Weight:       170.0 lb Date of Birth:  06-19-1961      BSA:          1.968 m Patient Age:    61 years      BP:           154/110 mmHg Patient Gender: M             HR:           98 bpm. Exam Location:  Inpatient Procedure: 2D Echo, Cardiac Doppler and Color Doppler Indications:    Artial Fibrillation I48.91  History:        Patient has prior history of Echocardiogram examinations, most                 recent 02/12/2021. CHF, COPD; Risk Factors:Diabetes, Dyslipidemia                 and Hypertension.  Sonographer:    Harriette Bouillon RDCS Referring Phys: [id number] RAJIV C PATEL IMPRESSIONS  1. Left ventricular ejection fraction, by estimation, is 70 to 75%. The left ventricle has hyperdynamic function. The left ventricle has no regional wall motion abnormalities. The left ventricular internal cavity size was mildly dilated. There is mild concentric left ventricular hypertrophy of the basal-septal segment. Left ventricular diastolic parameters are consistent with Grade II diastolic dysfunction (pseudonormalization). Elevated left ventricular end-diastolic pressure.  2. Right ventricular systolic function is normal. The right ventricular size is normal.  3. Left atrial size was mildly dilated.  4. The mitral valve is normal in structure. Trivial mitral valve regurgitation. No evidence of mitral stenosis.  5. The aortic valve is tricuspid. Aortic valve regurgitation is trivial. Aortic valve sclerosis is present, with no evidence of aortic valve stenosis.  6. The inferior vena cava is normal in size with greater than 50% respiratory variability, suggesting right atrial pressure of 3 mmHg. FINDINGS  Left Ventricle: Left ventricular ejection fraction, by estimation, is 70 to 75%. The left ventricle has hyperdynamic function. The  left ventricle has no regional wall motion abnormalities. The left ventricular internal cavity size was mildly dilated. There is mild concentric left ventricular hypertrophy of the basal-septal segment. Left ventricular diastolic parameters are consistent  with Grade II diastolic dysfunction (pseudonormalization). Elevated left ventricular end-diastolic pressure. Right Ventricle: The right ventricular size is normal. Right ventricular systolic function is normal. Left Atrium: Left atrial size was mildly dilated. Right Atrium: Right atrial size was normal in size. Pericardium: There is no evidence of pericardial effusion. Mitral Valve: The mitral valve is normal in structure. Mild mitral annular calcification. Trivial mitral valve regurgitation. No evidence of mitral valve stenosis. Tricuspid Valve: The tricuspid valve is normal in structure. Tricuspid valve regurgitation is trivial. No evidence of tricuspid stenosis. Aortic Valve: The aortic valve is tricuspid. Aortic valve regurgitation is trivial. Aortic valve sclerosis is present, with no evidence of aortic valve stenosis. Pulmonic Valve: The pulmonic valve was normal in structure. Pulmonic valve regurgitation is not visualized. No evidence of pulmonic stenosis. Aorta: The aortic root is normal in size and structure. Venous: The inferior vena cava is normal in size with greater than 50% respiratory variability, suggesting right atrial pressure of 3 mmHg. IAS/Shunts: No atrial level shunt detected by color flow Doppler.  LEFT VENTRICLE PLAX 2D LVIDd:         5.50 cm   Diastology LVIDs:         4.10 cm   LV e' medial:    4.57 cm/s LV PW:         0.90 cm   LV E/e' medial:  26.5 LV IVS:        0.90 cm   LV e' lateral:   5.44 cm/s LVOT diam:     2.40 cm   LV E/e' lateral: 22.2 LV SV:         95 LV SV Index:   48 LVOT Area:     4.52 cm  RIGHT VENTRICLE         IVC TAPSE (M-mode): 2.0 cm  IVC diam: 1.00 cm LEFT ATRIUM             Index LA diam:        4.50 cm 2.29 cm/m LA Vol (A2C):   46.5 ml 23.63 ml/m LA Vol (A4C):   63.3 ml 32.17 ml/m LA Biplane Vol: 54.8 ml 27.85 ml/m  AORTIC VALVE LVOT Vmax:   113.00 cm/s LVOT Vmean:  70.100 cm/s LVOT VTI:    0.210 m  AORTA Ao Root diam: 3.40 cm Ao Asc diam:  3.70 cm MITRAL  VALVE MV Area (PHT): 3.74 cm     SHUNTS MV Decel Time: 203 msec     Systemic VTI:  0.21 m MV E velocity: 121.00 cm/s  Systemic Diam: 2.40 cm MV A velocity: 98.80 cm/s MV E/A ratio:  1.22 Olga Millers MD Electronically signed by Olga Millers MD Signature Date/Time: 02/06/2023/11:49:05 AM    Final    DG CHEST PORT 1 VIEW  Result Date: 02/06/2023 CLINICAL DATA:  Dyspnea EXAM: PORTABLE CHEST 1 VIEW COMPARISON:  02/04/2023 FINDINGS: Diffuse interstitial and airspace infiltrate has developed demonstrating a perihilar predominance most suggestive of noncardiogenic pulmonary edema no pneumothorax or pleural effusion. Cardiac size within normal limits. No acute bone abnormality. IMPRESSION: 1. Interval development of noncardiogenic pulmonary edema. Electronically Signed   By: Helyn Numbers M.D.   On: 02/06/2023 00:42   DG Foot 2 Views Right  Result Date: 02/05/2023 CLINICAL DATA:  Osteomyelitis. EXAM: RIGHT FOOT -  2 VIEW COMPARISON:  09/28/2022 FINDINGS: Amputation of the first and second phalanges. No acute fracture or dislocation. Progressive erosive changes of the first metatarsal head concerning for osteomyelitis. Soft tissue wound overlying the lateral aspect of the fifth metatarsal head with a subtle erosion along the lateral margin concerning for osteomyelitis. Soft tissue wound overlying the first metatarsal stump. IMPRESSION: 1. Progressive erosive changes of the first metatarsal head concerning for osteomyelitis. 2. Soft tissue wound overlying the lateral aspect of the fifth metatarsal head with a subtle erosion along the lateral margin concerning for osteomyelitis. 3. If there is further clinical concern, recommend an MRI of the right forefoot. Electronically Signed   By: Elige Ko M.D.   On: 02/05/2023 12:49   - pertinent xrays, CT, MRI studies were reviewed and independently interpreted  Positive ROS: All other systems have been reviewed and were otherwise negative with the exception of those  mentioned in the HPI and as above.  Physical Exam: General: Alert, no acute distress Psychiatric: Patient is competent for consent with normal mood and affect Lymphatic: No axillary or cervical lymphadenopathy Cardiovascular: No pedal edema Respiratory: No cyanosis, no use of accessory musculature GI: No organomegaly, abdomen is soft and non-tender    Images:  @ENCIMAGES @  Labs:  Lab Results  Component Value Date   HGBA1C 11.7 (H) 02/05/2023   HGBA1C 10.8 (H) 08/18/2021   HGBA1C 12.3 (H) 01/21/2021   ESRSEDRATE 56 (H) 02/07/2023   CRP 13.0 (H) 02/07/2023   CRP 22.7 (H) 08/20/2021   CRP 23.2 (H) 08/20/2021   REPTSTATUS 05/03/2022 FINAL 05/01/2022   GRAMSTAIN NO WBC SEEN NO ORGANISMS SEEN  08/20/2021   CULT  05/01/2022    NO GROWTH Performed at [facility], [address], [place], [place] [address]    Imelda Pillow ENTEROCOCCUS FAECALIS (A) 12/12/2021    Lab Results  Component Value Date   ALBUMIN 3.7 02/04/2023   ALBUMIN 3.1 (L) 12/12/2021   ALBUMIN 1.8 (L) 08/26/2021        Latest Ref Rng & Units 02/07/2023   12:41 AM 02/06/2023    2:41 AM 02/04/2023    8:00 PM  CBC EXTENDED  WBC 4.0 - 10.5 K/uL 10.9  13.4  20.7   RBC 4.22 - 5.81 MIL/uL 4.35  5.08  5.42   Hemoglobin 13.0 - 17.0 g/dL 19.1  47.8  29.5   HCT 39.0 - 52.0 % 35.8  41.6  43.7   Platelets 150 - 400 K/uL 230  225  320   NEUT# 1.7 - 7.7 K/uL   16.6   Lymph# 0.7 - 4.0 K/uL   2.4     Neurologic: Patient does not have protective sensation bilateral lower extremities.   MUSCULOSKELETAL:   Skin: Examination there is necrotic bone and soft tissue lateral border of the fifth metatarsal head.  There is also a ulcer over the first metatarsal head.  Patient has a strong palpable dorsalis pedis pulse.  There is no ascending cellulitis.  Hemoglobin 12.3 with a white cell count of 10.9.  Hemoglobin A1c 11.7.  Review of the radiographs shows destructive bony changes of the first and fifth metatarsal  head.  Assessment: Uncontrolled type 2 diabetes status post revascularization to the right lower extremity with a good pulse at the ankle with osteomyelitis and necrotic ulceration to the fifth and first metatarsal head.  Plan: Will plan for a transmetatarsal amputation on Wednesday.  Risks and benefits were discussed including nonhealing of the wound need for additional  surgery.  Patient states he understands wished to proceed at this time.  Thank you for the consult and the opportunity to see Mr. Toree Dillahunt, MD [facility] [phone] 7:43 AM

## 2023-02-07 NOTE — Progress Notes (Signed)
Bedside nurse reported that patient potassium is 3.1 and low mag 1.5.  Phos 2.6 WNL.  Per chart review patient has been admitted for new onset of atrial flutter with RVR which has been resolved.  Currently on amiodarone. Given patient has high risk of development of irregular rhythm in the setting of electrolyte disturbance replating potassium and mag.  -Ordered oral KCl 80 mEq once and IV mag sulfate 2 g once. -Goal to keep potassium> 4, mag>2 and Phos above >2.5 -Checking BMP, mag and Phos in the a.m. and will replete as needed.   Tereasa Coop, MD [facility] 02/07/2023, 3:31 AM
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
## 2023-02-07 NOTE — Progress Notes (Signed)
ANTICOAGULATION CONSULT NOTE- Follow Up  Pharmacy Consult for heparin  Indication: atrial fibrillation  Allergies  Allergen Reactions   Hydromorphone Hcl Er Anaphylaxis and Other (See Comments)    Allergic to DYE in extended-release tablet, can tolerate other forms of hydromorphone   Tapentadol Anaphylaxis, Swelling and Other (See Comments)    THROAT ANGIOEDEMA Nucynta [Tapentadol Hydrochloride]   Exalamide Other (See Comments)    UNSPECIFIED REACTION     Patient Measurements: Height: 5\' 11"  (180.3 cm) Weight: 77.1 kg (169 lb 15.6 oz) IBW/kg (Calculated) : 75.3   Vital Signs: Temp: 98.6 F (37 C) (08/18 2332) Temp Source: Oral (08/18 2332) BP: 109/80 (08/18 2005) Pulse Rate: 92 (08/18 2005)  Labs: Recent Labs    02/04/23 2000 02/04/23 2202 02/05/23 0328 02/05/23 1209 02/05/23 2037 02/06/23 0235 02/06/23 0241 02/06/23 0739 02/07/23 0041  HGB 15.5  --   --   --   --   --  14.1  --  12.3*  HCT 43.7  --   --   --   --   --  41.6  --  35.8*  PLT 320  --   --   --   --   --  225  --  230  HEPARINUNFRC  --   --   --    < > 0.34 0.34  --   --  0.27*  CREATININE 0.72  --  0.55*  --   --   --  0.77 0.64 0.70  TROPONINIHS 12 15 12   --   --   --   --   --   --    < > = values in this interval not displayed.    Estimated Creatinine Clearance: 102 mL/min (by C-G formula based on SCr of 0.7 mg/dL).   Assessment: 61yom admitted with new Afib RVR attempted to rate control with diltiazem.  Hx preserved EF 70% 2022, recent PAD s/p amputation L AKA and VVS intervention R LE.  Pharmacy consulted to dose IV heparin.  Heparin level therapeutic and stable.  CBC stable; no bleeding reported.  Noted history of hematuria in June and November of 2023.  8/19 AM: heparin level returned at 0.27 on 1500 units/hr (subtherapeutic). Per RN, no issues with heparin drip running continuously or signs/symptoms of bleeding. Hgb 14>12.3  Goal of Therapy:  Heparin level 0.3-0.7 units/ml Monitor  platelets by anticoagulation protocol: Yes   Plan:  Increase heparin drip to 1650 units/hr   Check heparin level in 6h Daily heparin level and CBC Monitor closely for s/sx of bleeding  Arabella Merles, PharmD. Clinical Pharmacist 02/07/2023 2:21 AM

## 2023-02-07 NOTE — Progress Notes (Signed)
ANTICOAGULATION CONSULT NOTE- Follow Up  Pharmacy Consult for heparin  Indication: atrial fibrillation  Allergies  Allergen Reactions   Hydromorphone Hcl Er Anaphylaxis and Other (See Comments)    Allergic to DYE in extended-release tablet, can tolerate other forms of hydromorphone   Tapentadol Anaphylaxis, Swelling and Other (See Comments)    THROAT ANGIOEDEMA Nucynta [Tapentadol Hydrochloride]   Exalamide Other (See Comments)    UNSPECIFIED REACTION     Patient Measurements: Height: 5\' 11"  (180.3 cm) Weight: 77.1 kg (169 lb 15.6 oz) IBW/kg (Calculated) : 75.3   Vital Signs: Temp: 97.8 F (36.6 C) (08/19 0732) Temp Source: Oral (08/19 0732) BP: 151/84 (08/19 0400) Pulse Rate: 86 (08/19 0400)  Labs: Recent Labs    02/04/23 2000 02/04/23 2202 02/05/23 0328 02/05/23 0725 02/06/23 0235 02/06/23 0241 02/06/23 0739 02/07/23 0041 02/07/23 0811  HGB 15.5  --  13.6  --   --  14.1  --  12.3*  --   HCT 43.7  --  38.7*  --   --  41.6  --  35.8*  --   PLT 320  --  228  --   --  225  --  230  --   HEPARINUNFRC  --   --   --    < > 0.34  --   --  0.27* 0.35  CREATININE 0.72  --  0.55*  --   --  0.77 0.64 0.70 0.63  TROPONINIHS 12 15 12   --   --   --   --   --   --    < > = values in this interval not displayed.    Estimated Creatinine Clearance: 102 mL/min (by C-G formula based on SCr of 0.63 mg/dL).   Assessment: Troy [name] admitted with new Afib RVR attempted to rate control with diltiazem.  Hx preserved EF 70% 2022, recent PAD s/p amputation L AKA and VVS intervention R LE.  Pharmacy consulted to dose IV heparin. Noted history of hematuria in June and November of 2023.  HL 0.35 - therapeutic Hgb 12.3, Plt 230   Goal of Therapy:  Heparin level 0.3-0.7 units/ml Monitor platelets by anticoagulation protocol: Yes   Plan:  Continue heparin drip at 1650 units/hr   Daily heparin level and CBC Monitor closely for s/sx of bleeding F/u transition to PO anticoagulation as  appropriate pending TMA with Ortho on 8/21 -- per cardiology, plans to transition to Eliquis (copay check complete = $47/mo)   Calton Dach, PharmD, BCCCP Clinical Pharmacist 02/07/2023 9:21 AM

## 2023-02-07 NOTE — TOC Benefit Eligibility Note (Signed)
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
## 2023-02-08 ENCOUNTER — Inpatient Hospital Stay (HOSPITAL_COMMUNITY): Payer: Medicare Other

## 2023-02-08 DIAGNOSIS — I724 Aneurysm of artery of lower extremity: Secondary | ICD-10-CM

## 2023-02-08 DIAGNOSIS — I4891 Unspecified atrial fibrillation: Secondary | ICD-10-CM | POA: Diagnosis not present

## 2023-02-08 LAB — CBC
HCT: 38.8 % — ABNORMAL LOW (ref 39.0–52.0)
Hemoglobin: 13 g/dL (ref 13.0–17.0)
MCH: 27.9 pg (ref 26.0–34.0)
MCHC: 33.5 g/dL (ref 30.0–36.0)
MCV: 83.3 fL (ref 80.0–100.0)
Platelets: 219 10*3/uL (ref 150–400)
RBC: 4.66 MIL/uL (ref 4.22–5.81)
RDW: 13 % (ref 11.5–15.5)
WBC: 12.1 10*3/uL — ABNORMAL HIGH (ref 4.0–10.5)
nRBC: 0 % (ref 0.0–0.2)

## 2023-02-08 LAB — HEMOGLOBIN AND HEMATOCRIT, BLOOD
HCT: 37.5 % — ABNORMAL LOW (ref 39.0–52.0)
HCT: 36.8 % — ABNORMAL LOW (ref 39.0–52.0)
Hemoglobin: 12.8 g/dL — ABNORMAL LOW (ref 13.0–17.0)
Hemoglobin: 12.5 g/dL — ABNORMAL LOW (ref 13.0–17.0)

## 2023-02-08 LAB — HEPARIN LEVEL (UNFRACTIONATED)
Heparin Unfractionated: 0.27 [IU]/mL — ABNORMAL LOW (ref 0.30–0.70)
Heparin Unfractionated: 0.34 [IU]/mL (ref 0.30–0.70)

## 2023-02-08 LAB — BASIC METABOLIC PANEL
Anion gap: 9 (ref 5–15)
BUN: 12 mg/dL (ref 8–23)
CO2: 25 mmol/L (ref 22–32)
Calcium: 7.9 mg/dL — ABNORMAL LOW (ref 8.9–10.3)
Chloride: 96 mmol/L — ABNORMAL LOW (ref 98–111)
Creatinine, Ser: 0.52 mg/dL — ABNORMAL LOW (ref 0.61–1.24)
GFR, Estimated: >60 mL/min (ref >60)
Glucose, Bld: 210 mg/dL — ABNORMAL HIGH (ref 70–99)
Potassium: 4.1 mmol/L (ref 3.5–5.1)
Sodium: 130 mmol/L — ABNORMAL LOW (ref 135–145)

## 2023-02-08 LAB — GLUCOSE, CAPILLARY
Glucose-Capillary: 190 mg/dL — ABNORMAL HIGH (ref 70–99)
Glucose-Capillary: 162 mg/dL — ABNORMAL HIGH (ref 70–99)
Glucose-Capillary: 148 mg/dL — ABNORMAL HIGH (ref 70–99)
Glucose-Capillary: 194 mg/dL — ABNORMAL HIGH (ref 70–99)
Glucose-Capillary: 206 mg/dL — ABNORMAL HIGH (ref 70–99)

## 2023-02-08 LAB — MAGNESIUM: Magnesium: 1.4 mg/dL — ABNORMAL LOW (ref 1.7–2.4)

## 2023-02-08 MED ORDER — METRONIDAZOLE 500 MG/100ML IV SOLN
500.0000 mg | Freq: Two times a day (BID) | INTRAVENOUS | Status: AC
  Administered 2023-02-08 – 2023-02-11 (×7): 500 mg via INTRAVENOUS
  Filled 2023-02-08 (×8): qty 100

## 2023-02-08 MED ORDER — AMIODARONE HCL 200 MG PO TABS
200.0000 mg | ORAL_TABLET | Freq: Two times a day (BID) | ORAL | Status: DC
  Administered 2023-02-09 – 2023-02-13 (×9): 200 mg via ORAL
  Filled 2023-02-08 (×9): qty 1

## 2023-02-08 MED ORDER — MORPHINE SULFATE (PF) 2 MG/ML IV SOLN
2.0000 mg | INTRAVENOUS | Status: DC | PRN
  Administered 2023-02-09 – 2023-02-15 (×13): 2 mg via INTRAVENOUS
  Filled 2023-02-08 (×14): qty 1

## 2023-02-08 MED ORDER — LIVING WELL WITH DIABETES BOOK
Freq: Once | Status: AC
  Filled 2023-02-08: qty 1

## 2023-02-08 MED ORDER — INSULIN ASPART 100 UNIT/ML IJ SOLN
0.0000 [IU] | Freq: Every day | INTRAMUSCULAR | Status: DC
  Administered 2023-02-08: 2 [IU] via SUBCUTANEOUS
  Administered 2023-02-09: 5 [IU] via SUBCUTANEOUS
  Administered 2023-02-11: 2 [IU] via SUBCUTANEOUS
  Administered 2023-02-12 – 2023-02-14 (×3): 3 [IU] via SUBCUTANEOUS
  Administered 2023-02-15: 4 [IU] via SUBCUTANEOUS

## 2023-02-08 MED ORDER — SODIUM CHLORIDE 0.9 % IV SOLN
2.0000 g | Freq: Three times a day (TID) | INTRAVENOUS | Status: AC
  Administered 2023-02-08 – 2023-02-10 (×8): 2 g via INTRAVENOUS
  Filled 2023-02-08 (×8): qty 12.5

## 2023-02-08 MED ORDER — SODIUM CHLORIDE 0.9 % IV SOLN: INTRAVENOUS | Status: DC | PRN

## 2023-02-08 MED ORDER — INSULIN ASPART 100 UNIT/ML IJ SOLN
0.0000 [IU] | Freq: Three times a day (TID) | INTRAMUSCULAR | Status: DC
  Administered 2023-02-09: 2 [IU] via SUBCUTANEOUS
  Administered 2023-02-09: 5 [IU] via SUBCUTANEOUS
  Administered 2023-02-09: 2 [IU] via SUBCUTANEOUS
  Administered 2023-02-10 (×3): 5 [IU] via SUBCUTANEOUS
  Administered 2023-02-11: 3 [IU] via SUBCUTANEOUS
  Administered 2023-02-11 (×2): 8 [IU] via SUBCUTANEOUS
  Administered 2023-02-12: 11 [IU] via SUBCUTANEOUS
  Administered 2023-02-12: 8 [IU] via SUBCUTANEOUS
  Administered 2023-02-12 – 2023-02-13 (×2): 3 [IU] via SUBCUTANEOUS
  Administered 2023-02-13 – 2023-02-14 (×4): 5 [IU] via SUBCUTANEOUS
  Administered 2023-02-15 (×2): 15 [IU] via SUBCUTANEOUS
  Administered 2023-02-15: 11 [IU] via SUBCUTANEOUS
  Administered 2023-02-16 (×2): 15 [IU] via SUBCUTANEOUS

## 2023-02-08 MED ORDER — ONDANSETRON HCL 4 MG/2ML IJ SOLN
4.0000 mg | Freq: Four times a day (QID) | INTRAMUSCULAR | Status: DC | PRN
  Administered 2023-02-08: 4 mg via INTRAVENOUS
  Filled 2023-02-08: qty 2

## 2023-02-08 MED ORDER — MORPHINE SULFATE (PF) 2 MG/ML IV SOLN
4.0000 mg | Freq: Once | INTRAVENOUS | Status: AC
  Administered 2023-02-08: 4 mg via INTRAVENOUS
  Filled 2023-02-08: qty 2

## 2023-02-08 MED ORDER — MAGNESIUM SULFATE 2 GM/50ML IV SOLN
2.0000 g | Freq: Once | INTRAVENOUS | Status: AC
  Administered 2023-02-08: 2 g via INTRAVENOUS
  Filled 2023-02-08: qty 50
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
## 2023-02-08 NOTE — Inpatient Diabetes Management (Signed)
Inpatient Diabetes Program Recommendations  AACE/ADA: New Consensus Statement on Inpatient Glycemic Control (2015)  Target Ranges:  Prepandial:   less than 140 mg/dL      Peak postprandial:   less than 180 mg/dL (1-2 hours)      Critically ill patients:  140 - 180 mg/dL   Lab Results  Component Value Date   GLUCAP 194 (H) 02/08/2023   HGBA1C 11.7 (H) 02/05/2023    Review of Glycemic Control  Diabetes history: DM2 Outpatient Diabetes medications: None Current orders for Inpatient glycemic control: Semglee 10 every day, Novolog 0-15 Q4H  HgbA1C - 11.7% (average blood sugar 289 mg/dL)  Blood sugars trending well.  Inpatient Diabetes Program Recommendations:    Agree with orders.  Spoke with pt at bedside regarding his diabetes control and HgbA1C of 11.7%. Pt states he's been on insulin in the past, and agrees to take insulin again. Discussed importance of controlling blood sugars to reduce risks of complications. Pt very sleepy. Agrees to insulin at discharge.  May look at a 70/30 bid insulin instead of basal + bolus (4 shots/day). Will place Diabetes Discharge recs closer to d/c. If 70/30, consider 10 units BID.  Will continue to follow.  Thank you. Ailene Ards, RD, LDN, CDCES Inpatient Diabetes Coordinator [phone]

## 2023-02-08 NOTE — Progress Notes (Signed)
ANTICOAGULATION CONSULT NOTE - Follow Up Consult  Pharmacy Consult for heparin Indication: atrial fibrillation  Labs: Recent Labs    02/06/23 0241 02/06/23 0739 02/07/23 0041 02/07/23 0811 02/07/23 2008 02/08/23 0121 02/08/23 0850  HGB 14.1  --  12.3*  --  12.5* 13.0 12.8*  HCT 41.6  --  35.8*  --  36.1* 38.8* 37.5*  PLT 225  --  230  --   --  219  --   HEPARINUNFRC  --   --  0.27* 0.35  --  0.27* 0.34  CREATININE 0.77   < > 0.70 0.63  --  0.52*  --    < > = values in this interval not displayed.    Assessment: 61yo male subtherapeutic on heparin after one level at goal; no infusion issues or signs of bleeding per RN though he does note a bruise to the groin, already reported to [facility].  HL 0.35 - therapeutic   Goal of Therapy:  Heparin level 0.3-0.7 units/ml   Plan:  Discontinue heparin infusion d/t pseudoaneursym per vascular surg, pending possible compression   Calton Dach, PharmD, BCCCP Clinical Pharmacist 02/08/2023 10:56 AM

## 2023-02-08 NOTE — Progress Notes (Signed)
Patient Name: WYNTON ELSTAD Date of Encounter: 02/08/2023 [facility] Cardiologist: None   Interval Summary  .    No cardiovascular complaints overnight.  Remains in sinus rhythm.  Frequent PACs on monitor.  Vital Signs .    Vitals:   02/07/23 2359 02/08/23 0332 02/08/23 0418 02/08/23 0731  BP:      Pulse:      Resp:      Temp: 98.1 F (36.7 C) 98.4 F (36.9 C)  98.3 F (36.8 C)  TempSrc: Oral Oral  Oral  SpO2:      Weight:   78.2 kg   Height:        Intake/Output Summary (Last 24 hours) at 02/08/2023 0855 Last data filed at 02/08/2023 0800 Gross per 24 hour  Intake 2212.74 ml  Output 1645 ml  Net 567.74 ml      02/08/2023    4:18 AM 02/06/2023    1:28 AM 02/05/2023    5:00 AM  Last 3 Weights  Weight (lbs) 172 lb 6.4 oz 169 lb 15.6 oz 168 lb 3.4 oz  Weight (kg) 78.2 kg 77.1 kg 76.3 kg      Telemetry/ECG    Sinus rhythm with frequent PACs, occasional atrial couplets, rare PVCs.- Personally Reviewed  ECG from 02/05/2023 shows atrial flutter with variable AV block   ECHOcardiogram 02/06/2023    1. Left ventricular ejection fraction, by estimation, is 70 to 75%. The  left ventricle has hyperdynamic function. The left ventricle has no  regional wall motion abnormalities. The left ventricular internal cavity  size was mildly dilated. There is mild  concentric left ventricular hypertrophy of the basal-septal segment. Left  ventricular diastolic parameters are consistent with Grade II diastolic  dysfunction (pseudonormalization). Elevated left ventricular end-diastolic  pressure.   2. Right ventricular systolic function is normal. The right ventricular  size is normal.   3. Left atrial size was mildly dilated.   4. The mitral valve is normal in structure. Trivial mitral valve  regurgitation. No evidence of mitral stenosis.   5. The aortic valve is tricuspid. Aortic valve regurgitation is trivial.  Aortic valve sclerosis is present, with no evidence of  aortic valve  stenosis.   6. The inferior vena cava is normal in size with greater than 50%  respiratory variability, suggesting right atrial pressure of 3 mmHg.   Physical Exam .   GEN: No acute distress.   Neck: No JVD Cardiac: RRR, no murmurs, rubs, or gallops.  Respiratory: Clear to auscultation bilaterally. GI: Soft, nontender, non-distended  MS: Left AKA.  Right foot bandaged.  Assessment & Plan .       Signed          Patient Name: TREYVAN PUGLIANO Date of Encounter: 02/07/2023 [facility] Cardiologist: ([place])   Interval Summary  .     No cardiovascular complaints.  He was completely oblivious of the arrhythmia even when he had RVR.  Currently in sinus rhythm with frequent PACs.  Has had several bursts of atrial fibrillation overnight, all of them brief, still with RVR.   Vital Signs .           Vitals:    02/07/23 0300 02/07/23 0338 02/07/23 0400 02/07/23 0732  BP: 134/64   (!) 151/84    Pulse: 83   86    Resp: 18   16    Temp:   98.3 F (36.8 C)   97.8 F (36.6 C)  TempSrc:   Oral   Oral  SpO2: 93%   95%    Weight:          Height:              Intake/Output Summary (Last 24 hours) at 02/07/2023 0930 Last data filed at 02/07/2023 0400    Gross per 24 hour  Intake 937.14 ml  Output 2010 ml  Net -1072.86 ml        02/06/2023    1:28 AM 02/05/2023    5:00 AM 02/05/2023   12:04 AM  Last 3 Weights  Weight (lbs) 169 lb 15.6 oz 168 lb 3.4 oz 168 lb 3.4 oz  Weight (kg) 77.1 kg 76.3 kg 76.3 kg       Telemetry/ECG    Mostly normal sinus rhythm with frequent PACs and atrial couplets, occasional PVCs; a few brief bursts of paroxysmal atrial fibrillation with rapid ventricular response, generally lasting less than 15 minutes.- Personally Reviewed   ECG from 02/05/2023 shows atrial flutter with variable AV block   ECHOcardiogram 02/06/2023    1. Left ventricular ejection fraction, by estimation, is 70 to 75%. The  left ventricle has  hyperdynamic function. The left ventricle has no  regional wall motion abnormalities. The left ventricular internal cavity  size was mildly dilated. There is mild  concentric left ventricular hypertrophy of the basal-septal segment. Left  ventricular diastolic parameters are consistent with Grade II diastolic  dysfunction (pseudonormalization). Elevated left ventricular end-diastolic  pressure.   2. Right ventricular systolic function is normal. The right ventricular  size is normal.   3. Left atrial size was mildly dilated.   4. The mitral valve is normal in structure. Trivial mitral valve  regurgitation. No evidence of mitral stenosis.   5. The aortic valve is tricuspid. Aortic valve regurgitation is trivial.  Aortic valve sclerosis is present, with no evidence of aortic valve  stenosis.   6. The inferior vena cava is normal in size with greater than 50%  respiratory variability, suggesting right atrial pressure of 3 mmHg.      Physical Exam .   GEN: No acute distress.   Neck: No JVD Cardiac: RRR, no murmurs, rubs, or gallops.  Respiratory: Clear to auscultation bilaterally. GI: Soft, nontender, non-distended  MS: Status post left AKA.  He has roughly 3 cm indurated area at the site of the previous common femoral artery access.  Lots of surrounding ecchymosis.  No bruit.      Assessment & Plan .     62 y.o. male with past medical history of chronic diastolic congestive heart failure, diabetes mellitus, hypertension, hyperlipidemia, peripheral vascular disease, prior left AKA, recent right SFA stent on January 31, 2023, tobacco abuse admitted with atrial fibrillation/atrial flutter.  Hypotension on diltiazem and is now on amiodarone.  Containing sinus rhythm after transition to oral amiodarone.  Plan for surgery tomorrow.  Would not be a huge surprise if he has recurrent atrial fibrillation in the perioperative period.  Resume Eliquis once postsurgical bleeding is controlled.  Avoid  excessive IV fluids due to history of diastolic heart failure.  Currently appears clinically euvolemic.  Long-term need to watch for amiodarone hepatotoxicity in the setting of chronic liver disease.  Suspect left groin access site hematoma, but he believes it may have enlarged.  Will check an ultrasound for pseudoaneurysm.         For questions or updates, please contact [facility] Please consult [email or web address] for contact info under  Signed, Thurmon Fair, MD

## 2023-02-08 NOTE — Progress Notes (Signed)
    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  Patient will have a 4-hour straight leg time followed by continued bedrest.  We will continue to hold his heparin overnight.  Repeat left groin pseudoaneurysm duplex in the morning.  Emilie Rutter, PA-C [facility] [phone] 02/08/2023  2:27 PM

## 2023-02-08 NOTE — Progress Notes (Signed)
ANTICOAGULATION CONSULT NOTE - Follow Up Consult  Pharmacy Consult for heparin Indication: atrial fibrillation  Labs: Recent Labs    02/06/23 0241 02/06/23 0739 02/07/23 0041 02/07/23 0811 02/07/23 2008 02/08/23 0121 02/08/23 0850  HGB 14.1  --  12.3*  --  12.5* 13.0 12.8*  HCT 41.6  --  35.8*  --  36.1* 38.8* 37.5*  PLT 225  --  230  --   --  219  --   HEPARINUNFRC  --   --  0.27* 0.35  --  0.27* 0.34  CREATININE 0.77   < > 0.70 0.63  --  0.52*  --    < > = values in this interval not displayed.    Assessment: 61yo male subtherapeutic on heparin after one level at goal; no infusion issues or signs of bleeding per RN though he does note a bruise to the groin, already reported to [facility].  HL 0.35 - therapeutic   Goal of Therapy:  Heparin level 0.3-0.7 units/ml   Plan:  Discontinue heparin infusion d/t pseudoaneursym per vascular surg  Calton Dach, PharmD, BCCCP Clinical Pharmacist 02/08/2023 9:51 AM

## 2023-02-08 NOTE — Progress Notes (Signed)
Left lower extremity pseudoaneurysm evaluation study completed.  Preliminary results relayed to [facility], MD.   See CV Proc for preliminary results report.   Jean Rosenthal, RDMS, RVT
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
## 2023-02-08 NOTE — Plan of Care (Signed)
For transmetatarsal amputation right foot tomorrow. Patient already with left AKA w/ prosthesis.

## 2023-02-08 NOTE — Progress Notes (Signed)
  VASCULAR SURGERY ASSESSMENT & PLAN:   PSEUDOANEURYSM LEFT COMMON FEMORAL ARTERY: This patient underwent angioplasty and stenting of the right superficial femoral artery via left femoral approach on 01/31/2023.  He was noted to have a pulsatile mass in the left groin and duplex scan today shows a small pseudoaneurysm.  This measures 2.3 x 1.4 cm and is thus fairly small.  The neck is very short and measured at 0.55 centimeters.  Given the short neck I do not think he is an ideal candidate for thrombin injection.  I think it would be worth holding his anticoagulation and attempting manual compression.  If this is not successful the options would be continued observation and a follow-up study versus operative intervention.  SUBJECTIVE:   Mild discomfort left groin  PHYSICAL EXAM:   Vitals:   02/08/23 0731 02/08/23 0800 02/08/23 0900 02/08/23 1000  BP:  (!) 167/86 (!) 169/72 (!) 159/83  Pulse:  86 94 93  Resp:  (!) 26 13 19   Temp: 98.3 F (36.8 C)     TempSrc: Oral     SpO2:  93% 92% 95%  Weight:      Height:       Pulsatile mass left groin Duplex shows pseudoaneurysm as described above.  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

## 2023-02-09 ENCOUNTER — Inpatient Hospital Stay (HOSPITAL_COMMUNITY): Payer: Medicare Other

## 2023-02-09 ENCOUNTER — Inpatient Hospital Stay (HOSPITAL_COMMUNITY): Payer: Medicare Other | Admitting: Anesthesiology

## 2023-02-09 ENCOUNTER — Encounter (HOSPITAL_COMMUNITY)
Admission: EM | Disposition: A | Discharge status: Home Health | Payer: Self-pay | Source: Home / Self Care | Attending: Internal Medicine

## 2023-02-09 DIAGNOSIS — I10 Essential (primary) hypertension: Secondary | ICD-10-CM | POA: Diagnosis not present

## 2023-02-09 DIAGNOSIS — I4891 Unspecified atrial fibrillation: Secondary | ICD-10-CM | POA: Diagnosis not present

## 2023-02-09 DIAGNOSIS — I724 Aneurysm of artery of lower extremity: Secondary | ICD-10-CM | POA: Diagnosis not present

## 2023-02-09 DIAGNOSIS — E1169 Type 2 diabetes mellitus with other specified complication: Secondary | ICD-10-CM | POA: Diagnosis not present

## 2023-02-09 DIAGNOSIS — M86471 Chronic osteomyelitis with draining sinus, right ankle and foot: Secondary | ICD-10-CM

## 2023-02-09 DIAGNOSIS — F17210 Nicotine dependence, cigarettes, uncomplicated: Secondary | ICD-10-CM | POA: Diagnosis not present

## 2023-02-09 HISTORY — PX: AMPUTATION: SHX166

## 2023-02-09 LAB — MAGNESIUM: Magnesium: 1.5 mg/dL — ABNORMAL LOW (ref 1.7–2.4)

## 2023-02-09 LAB — BASIC METABOLIC PANEL
Anion gap: 10 (ref 5–15)
BUN: 8 mg/dL (ref 8–23)
CO2: 21 mmol/L — ABNORMAL LOW (ref 22–32)
Calcium: 8 mg/dL — ABNORMAL LOW (ref 8.9–10.3)
Chloride: 99 mmol/L (ref 98–111)
Creatinine, Ser: 0.55 mg/dL — ABNORMAL LOW (ref 0.61–1.24)
GFR, Estimated: >60 mL/min (ref >60)
Glucose, Bld: 202 mg/dL — ABNORMAL HIGH (ref 70–99)
Potassium: 4.2 mmol/L (ref 3.5–5.1)
Sodium: 130 mmol/L — ABNORMAL LOW (ref 135–145)

## 2023-02-09 LAB — CBC
HCT: 36.6 % — ABNORMAL LOW (ref 39.0–52.0)
Hemoglobin: 12.5 g/dL — ABNORMAL LOW (ref 13.0–17.0)
MCH: 28.5 pg (ref 26.0–34.0)
MCHC: 34.2 g/dL (ref 30.0–36.0)
MCV: 83.4 fL (ref 80.0–100.0)
Platelets: 253 10*3/uL (ref 150–400)
RBC: 4.39 MIL/uL (ref 4.22–5.81)
RDW: 13 % (ref 11.5–15.5)
WBC: 14.4 10*3/uL — ABNORMAL HIGH (ref 4.0–10.5)
nRBC: 0 % (ref 0.0–0.2)

## 2023-02-09 LAB — GLUCOSE, CAPILLARY
Glucose-Capillary: 218 mg/dL — ABNORMAL HIGH (ref 70–99)
Glucose-Capillary: 205 mg/dL — ABNORMAL HIGH (ref 70–99)
Glucose-Capillary: 148 mg/dL — ABNORMAL HIGH (ref 70–99)
Glucose-Capillary: 138 mg/dL — ABNORMAL HIGH (ref 70–99)
Glucose-Capillary: 139 mg/dL — ABNORMAL HIGH (ref 70–99)
Glucose-Capillary: 135 mg/dL — ABNORMAL HIGH (ref 70–99)
Glucose-Capillary: 353 mg/dL — ABNORMAL HIGH (ref 70–99)

## 2023-02-09 SURGERY — AMPUTATION, FOOT, PARTIAL
Anesthesia: Monitor Anesthesia Care | Site: Foot | Laterality: Right

## 2023-02-09 MED ORDER — VITAMIN C 500 MG PO TABS
1000.0000 mg | ORAL_TABLET | Freq: Every day | ORAL | Status: DC
  Administered 2023-02-09 – 2023-02-16 (×8): 1000 mg via ORAL
  Filled 2023-02-09 (×8): qty 2

## 2023-02-09 MED ORDER — HYDRALAZINE HCL 20 MG/ML IJ SOLN: 5.0000 mg | INTRAMUSCULAR | Status: DC | PRN

## 2023-02-09 MED ORDER — MAGNESIUM CITRATE PO SOLN: 1.0000 | Freq: Once | ORAL | Status: DC | PRN

## 2023-02-09 MED ORDER — 0.9 % SODIUM CHLORIDE (POUR BTL) OPTIME
TOPICAL | Status: DC | PRN
  Administered 2023-02-09: 1000 mL

## 2023-02-09 MED ORDER — ACETAMINOPHEN 325 MG PO TABS: 325.0000 mg | ORAL_TABLET | Freq: Four times a day (QID) | ORAL | Status: DC | PRN

## 2023-02-09 MED ORDER — FENTANYL CITRATE (PF) 250 MCG/5ML IJ SOLN
INTRAMUSCULAR | Status: AC
  Filled 2023-02-09: qty 5

## 2023-02-09 MED ORDER — PANTOPRAZOLE SODIUM 40 MG PO TBEC
40.0000 mg | DELAYED_RELEASE_TABLET | Freq: Every day | ORAL | Status: DC
  Administered 2023-02-09 – 2023-02-16 (×8): 40 mg via ORAL
  Filled 2023-02-09 (×8): qty 1

## 2023-02-09 MED ORDER — ONDANSETRON HCL 4 MG/2ML IJ SOLN
4.0000 mg | Freq: Four times a day (QID) | INTRAMUSCULAR | Status: DC | PRN
  Administered 2023-02-10: 4 mg via INTRAVENOUS
  Filled 2023-02-09: qty 2

## 2023-02-09 MED ORDER — LABETALOL HCL 5 MG/ML IV SOLN: 10.0000 mg | INTRAVENOUS | Status: DC | PRN

## 2023-02-09 MED ORDER — MAGNESIUM SULFATE 2 GM/50ML IV SOLN
2.0000 g | Freq: Once | INTRAVENOUS | Status: AC
  Administered 2023-02-09: 2 g via INTRAVENOUS
  Filled 2023-02-09: qty 50

## 2023-02-09 MED ORDER — LACTATED RINGERS IV SOLN: INTRAVENOUS | Status: DC

## 2023-02-09 MED ORDER — PHENOL 1.4 % MT LIQD: 1.0000 | OROMUCOSAL | Status: DC | PRN

## 2023-02-09 MED ORDER — CEFAZOLIN SODIUM-DEXTROSE 2-4 GM/100ML-% IV SOLN
2.0000 g | INTRAVENOUS | Status: AC
  Administered 2023-02-09: 2 g via INTRAVENOUS
  Filled 2023-02-09: qty 100

## 2023-02-09 MED ORDER — SODIUM CHLORIDE 0.9 % IV SOLN: INTRAVENOUS | Status: DC

## 2023-02-09 MED ORDER — FENTANYL CITRATE (PF) 100 MCG/2ML IJ SOLN: 75.0000 ug | Freq: Once | INTRAMUSCULAR | Status: AC

## 2023-02-09 MED ORDER — ZINC SULFATE 220 (50 ZN) MG PO CAPS
220.0000 mg | ORAL_CAPSULE | Freq: Every day | ORAL | Status: DC
  Administered 2023-02-09 – 2023-02-16 (×8): 220 mg via ORAL
  Filled 2023-02-09 (×8): qty 1

## 2023-02-09 MED ORDER — POVIDONE-IODINE 10 % EX SWAB: 2.0000 | Freq: Once | CUTANEOUS | Status: DC

## 2023-02-09 MED ORDER — FENTANYL CITRATE (PF) 100 MCG/2ML IJ SOLN
INTRAMUSCULAR | Status: AC
  Administered 2023-02-09: 75 ug via INTRAVENOUS
  Filled 2023-02-09: qty 2

## 2023-02-09 MED ORDER — ORAL CARE MOUTH RINSE: 15.0000 mL | Freq: Once | OROMUCOSAL | Status: AC

## 2023-02-09 MED ORDER — POTASSIUM CHLORIDE CRYS ER 20 MEQ PO TBCR: 20.0000 meq | EXTENDED_RELEASE_TABLET | Freq: Every day | ORAL | Status: DC | PRN

## 2023-02-09 MED ORDER — VASHE WOUND IRRIGATION OPTIME
TOPICAL | Status: DC | PRN
  Administered 2023-02-09: 34 [oz_av]

## 2023-02-09 MED ORDER — POLYETHYLENE GLYCOL 3350 17 G PO PACK
17.0000 g | PACK | Freq: Every day | ORAL | Status: DC | PRN
  Administered 2023-02-13 – 2023-02-15 (×3): 17 g via ORAL
  Filled 2023-02-09 (×3): qty 1

## 2023-02-09 MED ORDER — OXYCODONE HCL 5 MG PO TABS
5.0000 mg | ORAL_TABLET | ORAL | Status: DC | PRN
  Administered 2023-02-11: 10 mg via ORAL
  Filled 2023-02-09: qty 2

## 2023-02-09 MED ORDER — LOSARTAN POTASSIUM 25 MG PO TABS
25.0000 mg | ORAL_TABLET | Freq: Every day | ORAL | Status: DC
  Administered 2023-02-09 – 2023-02-16 (×8): 25 mg via ORAL
  Filled 2023-02-09 (×8): qty 1

## 2023-02-09 MED ORDER — LIDOCAINE 2% (20 MG/ML) 5 ML SYRINGE
INTRAMUSCULAR | Status: DC | PRN
  Administered 2023-02-09: 20 mg via INTRAVENOUS
  Administered 2023-02-09: 60 mg via INTRAVENOUS

## 2023-02-09 MED ORDER — ONDANSETRON HCL 4 MG/2ML IJ SOLN
INTRAMUSCULAR | Status: DC | PRN
  Administered 2023-02-09: 4 mg via INTRAVENOUS

## 2023-02-09 MED ORDER — MAGNESIUM SULFATE 2 GM/50ML IV SOLN: 2.0000 g | Freq: Every day | INTRAVENOUS | Status: DC | PRN

## 2023-02-09 MED ORDER — CHLORHEXIDINE GLUCONATE 0.12 % MT SOLN
15.0000 mL | Freq: Once | OROMUCOSAL | Status: AC
  Administered 2023-02-09: 15 mL via OROMUCOSAL
  Filled 2023-02-09: qty 15

## 2023-02-09 MED ORDER — OXYCODONE HCL 5 MG PO TABS
10.0000 mg | ORAL_TABLET | ORAL | Status: DC | PRN
  Administered 2023-02-09 – 2023-02-11 (×7): 15 mg via ORAL
  Administered 2023-02-11: 10 mg via ORAL
  Administered 2023-02-12 – 2023-02-15 (×12): 15 mg via ORAL
  Filled 2023-02-09 (×4): qty 3
  Filled 2023-02-09: qty 2
  Filled 2023-02-09 (×15): qty 3

## 2023-02-09 MED ORDER — DOCUSATE SODIUM 100 MG PO CAPS
100.0000 mg | ORAL_CAPSULE | Freq: Every day | ORAL | Status: DC
  Administered 2023-02-11 – 2023-02-16 (×5): 100 mg via ORAL
  Filled 2023-02-09 (×6): qty 1

## 2023-02-09 MED ORDER — BISACODYL 5 MG PO TBEC: 5.0000 mg | DELAYED_RELEASE_TABLET | Freq: Every day | ORAL | Status: DC | PRN

## 2023-02-09 MED ORDER — GUAIFENESIN-DM 100-10 MG/5ML PO SYRP
15.0000 mL | ORAL_SOLUTION | ORAL | Status: DC | PRN
  Administered 2023-02-10: 15 mL via ORAL
  Filled 2023-02-09 (×2): qty 15

## 2023-02-09 MED ORDER — CHLORHEXIDINE GLUCONATE 4 % EX SOLN
60.0000 mL | Freq: Once | CUTANEOUS | Status: AC
  Administered 2023-02-09: 4 via TOPICAL
  Filled 2023-02-09: qty 60

## 2023-02-09 MED ORDER — ALUM & MAG HYDROXIDE-SIMETH 200-200-20 MG/5ML PO SUSP: 15.0000 mL | ORAL | Status: DC | PRN

## 2023-02-09 MED ORDER — PROPOFOL 500 MG/50ML IV EMUL
INTRAVENOUS | Status: DC | PRN
  Administered 2023-02-09: 100 ug/kg/min via INTRAVENOUS

## 2023-02-09 MED ORDER — JUVEN PO PACK
1.0000 | PACK | Freq: Two times a day (BID) | ORAL | Status: DC
  Administered 2023-02-10 – 2023-02-16 (×9): 1 via ORAL
  Filled 2023-02-09 (×12): qty 1

## 2023-02-09 MED ORDER — METOPROLOL TARTRATE 5 MG/5ML IV SOLN: 2.0000 mg | INTRAVENOUS | Status: DC | PRN

## 2023-02-09 MED ORDER — CEFAZOLIN SODIUM-DEXTROSE 2-4 GM/100ML-% IV SOLN
2.0000 g | Freq: Three times a day (TID) | INTRAVENOUS | Status: AC
  Administered 2023-02-09 – 2023-02-10 (×2): 2 g via INTRAVENOUS
  Filled 2023-02-09 (×2): qty 100

## 2023-02-09 MED ORDER — PROPOFOL 10 MG/ML IV BOLUS
INTRAVENOUS | Status: DC | PRN
  Administered 2023-02-09: 20 mg via INTRAVENOUS
  Administered 2023-02-09: 30 mg via INTRAVENOUS

## 2023-02-09 SURGICAL SUPPLY — 43 items
APL SKNCLS STERI-STRIP NONHPOA (GAUZE/BANDAGES/DRESSINGS)
BAG COUNTER SPONGE SURGICOUNT (BAG) ×1 IMPLANT
BAG SPNG CNTER NS LX DISP (BAG) ×1
BENZOIN TINCTURE PRP APPL 2/3 (GAUZE/BANDAGES/DRESSINGS) ×1 IMPLANT
BLADE SAW SGTL HD 18.5X60.5X1. (BLADE) ×1 IMPLANT
BLADE SURG 21 STRL SS (BLADE) ×1 IMPLANT
BNDG CMPR 5X4 CHSV STRCH STRL (GAUZE/BANDAGES/DRESSINGS) ×1
BNDG COHESIVE 4X5 TAN STRL (GAUZE/BANDAGES/DRESSINGS) IMPLANT
BNDG COHESIVE 4X5 TAN STRL LF (GAUZE/BANDAGES/DRESSINGS) IMPLANT
BNDG GAUZE DERMACEA FLUFF 4 (GAUZE/BANDAGES/DRESSINGS) IMPLANT
BNDG GZE DERMACEA 4 6PLY (GAUZE/BANDAGES/DRESSINGS)
CANISTER WOUND CARE 500ML ATS (WOUND CARE) IMPLANT
CLEANSER WND VASHE INSTL 34OZ (WOUND CARE) IMPLANT
COVER SURGICAL LIGHT HANDLE (MISCELLANEOUS) ×1 IMPLANT
DRAPE DERMATAC (DRAPES) IMPLANT
DRAPE INCISE IOBAN 66X45 STRL (DRAPES) ×1 IMPLANT
DRAPE U-SHAPE 47X51 STRL (DRAPES) ×1 IMPLANT
DRESSING PEEL AND PLC PRVNA 13 (GAUZE/BANDAGES/DRESSINGS) IMPLANT
DRSG ADAPTIC 3X8 NADH LF (GAUZE/BANDAGES/DRESSINGS) IMPLANT
DRSG PEEL AND PLACE PREVENA 13 (GAUZE/BANDAGES/DRESSINGS) ×1
DURAPREP 26ML APPLICATOR (WOUND CARE) ×1 IMPLANT
ELECT REM PT RETURN 9FT ADLT (ELECTROSURGICAL) ×1
ELECTRODE REM PT RTRN 9FT ADLT (ELECTROSURGICAL) ×1 IMPLANT
GAUZE PAD ABD 8X10 STRL (GAUZE/BANDAGES/DRESSINGS) IMPLANT
GAUZE SPONGE 4X4 12PLY STRL (GAUZE/BANDAGES/DRESSINGS) IMPLANT
GLOVE BIOGEL PI IND STRL 9 (GLOVE) ×1 IMPLANT
GLOVE SURG ORTHO 9.0 STRL STRW (GLOVE) ×1 IMPLANT
GOWN STRL REUS W/ TWL XL LVL3 (GOWN DISPOSABLE) ×3 IMPLANT
GOWN STRL REUS W/TWL XL LVL3 (GOWN DISPOSABLE) ×3
GRAFT SKIN WND MICRO 38 (Tissue) IMPLANT
KIT BASIN OR (CUSTOM PROCEDURE TRAY) ×1 IMPLANT
KIT PREVENA INCISION MGT20CM45 (CANNISTER) IMPLANT
KIT TURNOVER KIT B (KITS) ×1 IMPLANT
NS IRRIG 1000ML POUR BTL (IV SOLUTION) ×1 IMPLANT
PACK ORTHO EXTREMITY (CUSTOM PROCEDURE TRAY) ×1 IMPLANT
PAD ARMBOARD 7.5X6 YLW CONV (MISCELLANEOUS) ×2 IMPLANT
SPONGE T-LAP 18X18 ~~LOC~~+RFID (SPONGE) IMPLANT
SUT ETHILON 2 0 PSLX (SUTURE) ×2 IMPLANT
TOWEL GREEN STERILE (TOWEL DISPOSABLE) ×1 IMPLANT
TOWEL GREEN STERILE FF (TOWEL DISPOSABLE) ×1 IMPLANT
TUBE CONNECTING 12X1/4 (SUCTIONS) ×1 IMPLANT
WATER STERILE IRR 1000ML POUR (IV SOLUTION) ×1 IMPLANT
YANKAUER SUCT BULB TIP NO VENT (SUCTIONS) ×1 IMPLANT

## 2023-02-09 NOTE — Progress Notes (Addendum)
  VASCULAR SURGERY ASSESSMENT & PLAN:   LEFT COMMON FEMORAL ARTERY PSEUDOANEURYSM: The patient underwent successful manual compression of his pseudoaneurysm yesterday.  This morning this feels like this is thrombosed.  He is for a follow-up duplex today.  ADDENDUM: His follow-up duplex scan today shows resolution of his pseudoaneurysm..  Vascular surgery will be available as needed.  SUBJECTIVE:   No complaints.  PHYSICAL EXAM:   Vitals:   02/08/23 2000 02/08/23 2100 02/08/23 2200 02/09/23 0335  BP: (!) 162/83 (!) 169/82 (!) 169/93 (!) 145/84  Pulse: 89 88 84 70  Resp: (!) 0 (!) 6 16 17   Temp:    98.9 F (37.2 C)  TempSrc:    Oral  SpO2: 92% 92% 95% 96%  Weight:    83.5 kg  Height:       The mass in his left groin is nonpulsatile.  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

## 2023-02-09 NOTE — Progress Notes (Signed)
Orthopedic Tech Progress Note Patient Details:  Troy [name] April 30, 1961 [id number]  Ortho Devices Type of Ortho Device: Postop shoe/boot Ortho Device/Splint Location: RLE Ortho Device/Splint Interventions: Application   Post Interventions Patient Tolerated: Well  Genelle Bal Kenika Sahm 02/09/2023, 6:11 PM

## 2023-02-09 NOTE — Anesthesia Preprocedure Evaluation (Signed)
Anesthesia Evaluation  Patient identified by MRN, date of birth, ID band Patient awake    Reviewed: Allergy & Precautions, NPO status , Patient's Chart, lab work & pertinent test results  History of Anesthesia Complications Negative for: history of anesthetic complications  Airway Mallampati: III  TM Distance: >3 FB Neck ROM: Full    Dental  (+) Dental Advisory Given   Pulmonary COPD, Current Smoker   breath sounds clear to auscultation       Cardiovascular hypertension, (-) angina + Peripheral Vascular Disease  (-) Past MI  Rhythm:Regular   1. Left ventricular ejection fraction, by estimation, is 70 to 75%. The  left ventricle has hyperdynamic function. The left ventricle has no  regional wall motion abnormalities. The left ventricular internal cavity  size was mildly dilated. There is mild  concentric left ventricular hypertrophy of the basal-septal segment. Left  ventricular diastolic parameters are consistent with Grade II diastolic  dysfunction (pseudonormalization). Elevated left ventricular end-diastolic  pressure.   2. Right ventricular systolic function is normal. The right ventricular  size is normal.   3. Left atrial size was mildly dilated.   4. The mitral valve is normal in structure. Trivial mitral valve  regurgitation. No evidence of mitral stenosis.   5. The aortic valve is tricuspid. Aortic valve regurgitation is trivial.  Aortic valve sclerosis is present, with no evidence of aortic valve  stenosis.   6. The inferior vena cava is normal in size with greater than 50%  respiratory variability, suggesting right atrial pressure of 3 mmHg.     Neuro/Psych  PSYCHIATRIC DISORDERS Anxiety Depression     Neuromuscular disease    GI/Hepatic   Endo/Other  diabetes, Poorly Controlled  Lab Results      Component                Value               Date                      HGBA1C                   11.7 (H)             02/05/2023             Renal/GU Lab Results      Component                Value               Date                      NA                       130 (L)             02/09/2023                K                        4.2                 02/09/2023                CO2                      21 (L)  02/09/2023                GLUCOSE                  202 (H)             02/09/2023                BUN                      8                   02/09/2023                CREATININE               0.55 (L)            02/09/2023                CALCIUM                  8.0 (L)             02/09/2023                GFRNONAA                 >60                 02/09/2023                Musculoskeletal   Abdominal   Peds  Hematology  (+) Blood dyscrasia, anemia Lab Results      Component                Value               Date                      WBC                      14.4 (H)            02/09/2023                HGB                      12.5 (L)            02/09/2023                HCT                      36.6 (L)            02/09/2023                MCV                      83.4                02/09/2023                PLT                      253                 02/09/2023              Anesthesia Other Findings  Reproductive/Obstetrics                              Anesthesia Physical Anesthesia Plan  ASA: 3  Anesthesia Plan: MAC and Regional   Post-op Pain Management: Regional block*   Induction: Intravenous  PONV Risk Score and Plan: 0 and Propofol infusion and Ondansetron  Airway Management Planned: Nasal Cannula, Natural Airway and Simple Face Mask  Additional Equipment: None  Intra-op Plan:   Post-operative Plan:   Informed Consent: I have reviewed the patients History and Physical, chart, labs and discussed the procedure including the risks, benefits and alternatives for the proposed anesthesia with the patient or authorized  representative who has indicated his/her understanding and acceptance.     Dental advisory given  Plan Discussed with: CRNA  Anesthesia Plan Comments:          Anesthesia Quick Evaluation

## 2023-02-09 NOTE — Progress Notes (Addendum)
   Patient Name: Troy [name] Date of Encounter: 02/09/2023 [facility] Cardiologist: Dr Tenny Craw   Interval Summary  .    62 yr old male with PMH of HFpEF, HTN, HLD, type 2 DM, LLE AKA, PVD s/p R SFA stent, cirrhosis, prior tobacco use, who is admitted for osteomyelitis, cardiology is following for A fib/flutter RVR.   Patient feels well, no heart palpitation, chest pain, SOB, dizziness.   Vital Signs .    Vitals:   02/08/23 2200 02/09/23 0335 02/09/23 0733 02/09/23 1015  BP: (!) 169/93 (!) 145/84 (!) 145/84 138/89  Pulse: 84 70 80 67  Resp: 16 17 20 20   Temp:  98.9 F (37.2 C) 99 F (37.2 C)   TempSrc:  Oral Oral   SpO2: 95% 96% 96% 98%  Weight:  83.5 kg    Height:        Intake/Output Summary (Last 24 hours) at 02/09/2023 1029 Last data filed at 02/08/2023 2346 Gross per 24 hour  Intake 1075.07 ml  Output 1250 ml  Net -174.93 ml      02/09/2023    3:35 AM 02/08/2023    4:18 AM 02/06/2023    1:28 AM  Last 3 Weights  Weight (lbs) 184 lb 1.4 oz 172 lb 6.4 oz 169 lb 15.6 oz  Weight (kg) 83.5 kg 78.2 kg 77.1 kg      Telemetry/ECG    Sinus rhythm with PACs and occasional PVCs - Personally Reviewed  Physical Exam .   GEN: No acute distress.   Neck: No JVD Cardiac: RRR, no murmurs, rubs, or gallops.  Respiratory: Clear to auscultation bilaterally. On [place] oxygen  GI: Soft, nontender, non-distended  MS: No leg edema  Assessment & Plan .     Paroxysmal A fib/flutter with RVR - TSH WNL from 02/06/23; Echo with LVEF 70-75%, no RWMA, mild LVH, grade II DD, normal RV, mild LAE, trivial MR and AI; likely mediated by infection  - hypotensive with diltiazem, converted to SR with amiodarone gtt, now transitioned to PO amiodarone, s/p 400mg  BID for 3 days starting 8/18, now on 200mg  BID for 4 more days from 8/21, then 200mg  daily after for a total 1 month, to avoid long term use given potential toxicity with cirrhosis  - recommend Eliquis 5mg  BID when cleared by surgery  given CHADS2VASc of 4  Chronic diastolic heart failure HTN - no volume overloaded today - not on loop diuretic PTA - monitor fluid status, PRN lasix if needed - BP elevated now, will add losartan 25mg  for HTN, not on anti-hypertensive PTA; not candidate for SGLT2I given poorly controlled DM with A1c 11.7%     For questions or updates, please contact [facility] Please consult [email or web address] for contact info under        Signed, Cyndi Bender, NP    I have seen and examined the patient along with Cyndi Bender, NP .  I have reviewed the chart, notes and new data.  I agree with PA/NP's note.  Key new complaints: no angina or dyspnea Key examination changes: no recurrence of AFib on monitor Key new findings / data: low Mg, replacing  PLAN: Monitor for periop arrhythmia recurrence. ARB added for HBP and renal protection in DM.  Thurmon Fair, MD, [facility] [phone] 02/09/2023, 11:10 AM

## 2023-02-09 NOTE — TOC Initial Note (Signed)
Transition of Care [facility]) - Initial/Assessment Note    Patient Details  Name: Troy [name] MRN: [id number] Date of Birth: May 03, 1961  [facility] CM/SW Contact:    Lawerance Sabal, RN Phone Number: 02/09/2023, 10:38 AM  Clinical Narrative:                  Chart reviewed.  Patient admitted with Worsening ulceration right foot fifth metatarsal head with plan for transmetatarsal amputation.  Patient is from home w wife.  Patient has insurance coverage and no PCP listed.  TOC will follow for acute DC needs.    Expected Discharge Plan: Home/Self Care Barriers to Discharge: Continued Medical Work up   Patient Goals and CMS Choice Patient states their goals for this hospitalization and ongoing recovery are:: anticipate return home          Expected Discharge Plan and Services   Discharge Planning Services: CM Consult   Living arrangements for the past 2 months: Single Family Home                                      Prior Living Arrangements/Services Living arrangements for the past 2 months: Single Family Home Lives with:: Spouse                   Activities of Daily Living      Permission Sought/Granted                  Emotional Assessment              Admission diagnosis:  Atrial flutter (HCC) [I48.92] Atrial fibrillation with RVR (HCC) [I48.91] Atrial flutter, unspecified type (HCC) [I48.92] Atrial flutter with rapid ventricular response (HCC) [I48.92] Patient Active Problem List   Diagnosis Date Noted   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 08/22/2021   Diabetic acidosis without coma (HCC)    Hypokalemia    Hypomagnesemia    Hyponatremia    Pneumonia due to infectious organism     Parapneumonic effusion 08/18/2021   Bowel Ileus (HCC) 08/18/2021   ?? 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   Ketoacidosis due to type 2 diabetes mellitus (HCC) 08/15/2021   COVID-19 virus infection 08/15/2021   S/P AKA (above knee amputation), left (HCC) 02/12/2021   Amputation of right great toe and 2nd Toe 02/12/2021   Sepsis due to left-sided neck cellulitis/MRSA 02/12/2021   Hypotensive episode    Elevated troponin    SIRS (systemic inflammatory response syndrome) (HCC) 02/11/2021   Abscess of bursa of left ankle 01/21/2021   Dehiscence of amputation stump (HCC)    Ischemia of left BKA site (HCC)    Abscess of leg without foot, left    Osteomyelitis of second toe of right foot (HCC) 07/12/2019   Below knee amputation status, left 01/25/2018   Wound dehiscence, surgical    Status post left foot surgery 12/15/2017   GERD (gastroesophageal reflux disease) 09/26/2017   Hyperlipidemia associated with type 2 diabetes mellitus (HCC) 07/08/2017   Snoring 06/07/2016   Chest tube in place 01/22/2016   Abnormal nuclear stress test 01/22/2016   Pain, chronic due to trauma 07/04/2012   Complex regional pain  syndrome I of lower limb 07/04/2012   COPD (chronic obstructive pulmonary disease) (HCC) 01/27/2011   Pre-operative cardiovascular examination 01/27/2011   Nonspecific abnormal electrocardiogram (ECG) (EKG) 01/27/2011   Murmur 01/27/2011   DM2 (diabetes mellitus, type 2) (HCC)    HTN (hypertension)    HLD (hyperlipidemia)    Tobacco abuse    PCP:  Pcp, No Pharmacy:   [id number] - [place], [place] - [address] [place] [address] Phone: [phone] Fax: [phone]     Social Determinants of Health (SDOH) Social History: SDOH Screenings   Food Insecurity: No Food Insecurity (04/09/2021)   Received from [facility]  Transportation Needs: No Transportation Needs (04/09/2021)   Received from [facility]  Financial Resource Strain: Low Risk  (04/09/2021)   Received from [facility]  Physical Activity: Insufficiently Active (04/09/2021)   Received from [facility], [facility]  Social Connections: Unknown (10/22/2021)   Received from [facility], [facility]  Stress: No Stress Concern Present (04/09/2021)   Received from [facility], [facility]  Tobacco Use: High Risk (02/04/2023)   SDOH Interventions:     Readmission Risk Interventions    08/25/2021   12:46 PM  Readmission Risk Prevention Plan  Transportation Screening Complete  Medication Review (RN Care Manager) Complete  PCP or Specialist appointment within 3-5 days of discharge Complete  HRI or Home Care Consult Complete  SW Recovery Care/Counseling Consult Complete  Palliative Care Screening Not Applicable  Skilled Nursing Facility Complete

## 2023-02-09 NOTE — Progress Notes (Signed)
Left lower extremity pseudoaneurysm post-compression evaluation study completed.   Please see CV Proc for preliminary results.   Jean Rosenthal, RDMS, RVT

## 2023-02-09 NOTE — Plan of Care (Signed)
  Problem: Education: Goal: Understanding of CV disease, CV risk reduction, and recovery process will improve Outcome: Progressing   Problem: Activity: Goal: Ability to return to baseline activity level will improve Outcome: Progressing   Problem: Cardiovascular: Goal: Ability to achieve and maintain adequate cardiovascular perfusion will improve Outcome: Progressing Goal: Vascular access site(s) Level 0-1 will be maintained Outcome: Progressing   Problem: Health Behavior/Discharge Planning: Goal: Ability to safely manage health-related needs after discharge will improve Outcome: Progressing   Problem: Education: Goal: Knowledge of General Education information will improve Description: Including pain rating scale, medication(s)/side effects and non-pharmacologic comfort measures Outcome: Progressing   Problem: Health Behavior/Discharge Planning: Goal: Ability to manage health-related needs will improve Outcome: Progressing   Problem: Clinical Measurements: Goal: Ability to maintain clinical measurements within normal limits will improve Outcome: Progressing Goal: Will remain free from infection Outcome: Progressing Goal: Diagnostic test results will improve Outcome: Progressing Goal: Respiratory complications will improve Outcome: Progressing Goal: Cardiovascular complication will be avoided Outcome: Progressing   Problem: Nutrition: Goal: Adequate nutrition will be maintained Outcome: Progressing   Problem: Coping: Goal: Level of anxiety will decrease Outcome: Progressing   Problem: Elimination: Goal: Will not experience complications related to bowel motility Outcome: Progressing Goal: Will not experience complications related to urinary retention Outcome: Progressing   Problem: Pain Managment: Goal: General experience of comfort will improve Outcome: Progressing   Problem: Safety: Goal: Ability to remain free from injury will improve Outcome:  Progressing   Problem: Skin Integrity: Goal: Risk for impaired skin integrity will decrease Outcome: Progressing   Problem: Activity: Goal: Risk for activity intolerance will decrease Outcome: Not Progressing

## 2023-02-09 NOTE — Interval H&P Note (Signed)
History and Physical Interval Note:  02/09/2023 6:33 AM  Troy [name]  has presented today for surgery, with the diagnosis of osteomyelitis right foot.  The various methods of treatment have been discussed with the patient and family. After consideration of risks, benefits and other options for treatment, the patient has consented to  Procedure(s): RIGHT TRANSMETATARSAL AMPUTATION (Right) as a surgical intervention.  The patient's history has been reviewed, patient examined, no change in status, stable for surgery.  I have reviewed the patient's chart and labs.  Questions were answered to the patient's satisfaction.     Nadara Mustard
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
## 2023-02-09 NOTE — Transfer of Care (Signed)
Immediate Anesthesia Transfer of Care Note  Patient: Troy [name]  Procedure(s) Performed: RIGHT TRANSMETATARSAL AMPUTATION (Right: Foot)  Patient Location: PACU  Anesthesia Type:MAC  Level of Consciousness: awake and alert   Airway & Oxygen Therapy: Patient Spontanous Breathing  Post-op Assessment: Report given to RN and Post -op Vital signs reviewed and stable  Post vital signs: Reviewed and stable  Last Vitals:  Vitals Value Taken Time  BP 95/60 02/09/23 1515  Temp 36.5 C 02/09/23 1510  Pulse 69 02/09/23 1521  Resp 13 02/09/23 1521  SpO2 92 % 02/09/23 1521  Vitals shown include unfiled device data.  Last Pain:  Vitals:   02/09/23 1510  TempSrc:   PainSc: Asleep      Patients Stated Pain Goal: 0 (02/08/23 1630)  Complications: No notable events documented.

## 2023-02-09 NOTE — Care Management Important Message (Signed)
Important Message  Patient Details  Name: Troy [name] MRN: [id number] Date of Birth: 04/04/1961   Medicare Important Message Given:  Yes     Renie Ora 02/09/2023, 8:10 AM

## 2023-02-09 NOTE — Op Note (Signed)
02/04/2023 - 02/09/2023  2:57 PM  PATIENT:  Troy [name]    PRE-OPERATIVE DIAGNOSIS:  osteomyelitis right foot  POST-OPERATIVE DIAGNOSIS:  Same  PROCEDURE:  RIGHT TRANSMETATARSAL AMPUTATION Application Kerecis micro graft. Application Prevena 13 cm wound VAC.  SURGEON:  Nadara Mustard, MD  PHYSICIAN ASSISTANT:None ANESTHESIA:   General  PREOPERATIVE INDICATIONS:  Troy [name] is a  62 y.o. male with a diagnosis of osteomyelitis right foot who failed conservative measures and elected for surgical management.    The risks benefits and alternatives were discussed with the patient preoperatively including but not limited to the risks of infection, bleeding, nerve injury, cardiopulmonary complications, the need for revision surgery, among others, and the patient was willing to proceed.  OPERATIVE IMPLANTS:   Implant Name Type Inv. Item Serial No. Manufacturer Lot No. LRB No. Used Action  GRAFT SKIN WND MICRO 38 - [id number] Tissue GRAFT SKIN WND MICRO 38  [employer] [id number] Right 1 Implanted    @ENCIMAGES @  OPERATIVE FINDINGS: Tissue margins clear with good petechial bleeding.  Complete occlusion of the dorsalis pedis artery.  OPERATIVE PROCEDURE: Patient was brought the operating room underwent a general anesthetic.  After adequate levels anesthesia were obtained patient's right lower extremity was prepped using DuraPrep draped into a sterile field a timeout was called.  A fishmouth incision was made just proximal to the ulcer in the first metatarsal and fifth metatarsal head.  This was carried sharply down to bone and oscillating saw was used to perform a transmetatarsal amputation.  Electrocautery was used for hemostasis.  The muscle had good color contractility and consistency.  After hemostasis was obtained the wound was irrigated with Vashe irrigation.  The wound bed was then filled with Kerecis micro graft 38 cm to cover wound surface area greater than 100 cm.  The incision  was closed using 2-0 nylon a 13 cm Prevena wound VAC was applied this had a good suction fit patient was taken the PACU in stable condition.   DISCHARGE PLANNING:  Antibiotic duration: Antibiotics for 24 hours  Weightbearing: Touchdown weightbearing on the right  Pain medication: Opioid pathway  Dressing care/ Wound VAC: Continue wound VAC for 1 week  Ambulatory devices: Walker or crutches  Discharge to: Discharge to home possible discharge to inpatient rehab.  Follow-up: In the office 1 week post operative.

## 2023-02-09 NOTE — Inpatient Diabetes Management (Addendum)
Inpatient Diabetes Program Recommendations  AACE/ADA: New Consensus Statement on Inpatient Glycemic Control (2015)  Target Ranges:  Prepandial:   less than 140 mg/dL      Peak postprandial:   less than 180 mg/dL (1-2 hours)      Critically ill patients:  140 - 180 mg/dL    Latest Reference Range & Units 02/05/23 03:28  Hemoglobin A1C 4.8 - 5.6 % 11.7 (H)  289 mg/dl  (H): Data is abnormally high  Latest Reference Range & Units 02/07/23 23:25 02/08/23 03:21 02/08/23 07:30 02/08/23 11:09 02/08/23 15:29 02/08/23 19:07  Glucose-Capillary 70 - 99 mg/dL 865 (H)  3 units Novolog  190 (H)  3 units Novolog  162 (H)  3 units Novolog  148 (H)  2 units Novolog  10 units Semglee  194 (H)  3 units Novolog  206 (H)  5 units Novolog   (H): Data is abnormally high  Latest Reference Range & Units 02/08/23 23:45 02/09/23 07:31  Glucose-Capillary 70 - 99 mg/dL 784 (H)  2 units Novolog  205 (H)  5 units Novolog   (H): Data is abnormally high   Admit with:  Persistent atrial fibrillation  Concern for osteomyelitis with right foot wounds, POA  Wounds to right foot with purulent drainage  History: DM  Home DM Meds: None Listed  Current Orders: Semglee 10 units Daily      Novolog Moderate Correction Scale/ SSI (0-15 units) TID AC + HS    MD- Please consider increasing the Semglee slightly to 12 units Daily (CBG 205 this AM)  Pt willing to restart insulin at home--Has used Tresiba insulin in the past.  May consider simple Once daily Basal insulin (Lantus or [place]) +  Oral medication like [place] or [name].    Addendum 11:15am--Met w/ pt at bedside this AM--Wife present as well.  Pt A&O and able to answer all questions, however, wife answered most questions for pt.  Pt has CBG meter at home but NOT checking CBGs consistently per wife report.  Pt stopped Tresiba insulin and oral meds over 2-3 years ago.  Wife told me pt has taken Glipizide, Farxiga, Metformin in the  past--Has also taken [place] Insulin about 2-3 years ago.  Wife stated to me that pt doesn't care about his diabetes.  Spoke with patient and wife about his current A1c of 11.7%.  Explained what an A1c is and what it measures.  Reminded patient that his goal A1c is 7% or less per ADA standards to prevent both acute and long-term complications.  Also reviewed goal CBGs for home.  Explained to patient the extreme importance of good glucose control at home especially to prevent further wounds and further loss of limbs and other vascular complications.  Explained to pt the importance of tight CBG control for healing post-op.  Encouraged patient to check his CBGs at least bid at home (fasting and another check within the day) and to record all CBGs in a logbook for his PCP to review.  Pt needs to get established with PCP after d/c.  I asked pt if he would be willing to restart insulin at home after d/c--Pt stated he would be open to it.  Wife told me she gives herself insulin pen injections and pt has also used insulin pens in the past.  Bother are familiar with insulin pens and told me they did not need any re-education on using insulin pens.  Wife told me pt still has some Tresiba insulin at home in  the fridge.  Asked wife to check the expiration date on the Tresiba and if expired to please throw that insulin away.     --Will follow patient during hospitalization--  Ambrose Finland RN, MSN, CDCES Diabetes Coordinator Inpatient Glycemic Control Team Team Pager: [phone] (8a-5p)

## 2023-02-10 ENCOUNTER — Encounter (HOSPITAL_COMMUNITY): Payer: Self-pay | Admitting: Orthopedic Surgery

## 2023-02-10 DIAGNOSIS — I4891 Unspecified atrial fibrillation: Secondary | ICD-10-CM | POA: Diagnosis not present

## 2023-02-10 LAB — CBC
HCT: 37.6 % — ABNORMAL LOW (ref 39.0–52.0)
Hemoglobin: 12.4 g/dL — ABNORMAL LOW (ref 13.0–17.0)
MCH: 27.9 pg (ref 26.0–34.0)
MCHC: 33 g/dL (ref 30.0–36.0)
MCV: 84.7 fL (ref 80.0–100.0)
Platelets: 306 10*3/uL (ref 150–400)
RBC: 4.44 MIL/uL (ref 4.22–5.81)
RDW: 13.5 % (ref 11.5–15.5)
WBC: 16.7 10*3/uL — ABNORMAL HIGH (ref 4.0–10.5)
nRBC: 0 % (ref 0.0–0.2)

## 2023-02-10 LAB — BASIC METABOLIC PANEL
Anion gap: 11 (ref 5–15)
BUN: 10 mg/dL (ref 8–23)
CO2: 26 mmol/L (ref 22–32)
Calcium: 8 mg/dL — ABNORMAL LOW (ref 8.9–10.3)
Chloride: 97 mmol/L — ABNORMAL LOW (ref 98–111)
Creatinine, Ser: 0.73 mg/dL (ref 0.61–1.24)
GFR, Estimated: >60 mL/min (ref >60)
Glucose, Bld: 252 mg/dL — ABNORMAL HIGH (ref 70–99)
Potassium: 3.9 mmol/L (ref 3.5–5.1)
Sodium: 134 mmol/L — ABNORMAL LOW (ref 135–145)

## 2023-02-10 LAB — GLUCOSE, CAPILLARY
Glucose-Capillary: 214 mg/dL — ABNORMAL HIGH (ref 70–99)
Glucose-Capillary: 204 mg/dL — ABNORMAL HIGH (ref 70–99)
Glucose-Capillary: 218 mg/dL — ABNORMAL HIGH (ref 70–99)
Glucose-Capillary: 188 mg/dL — ABNORMAL HIGH (ref 70–99)

## 2023-02-10 LAB — MAGNESIUM: Magnesium: 1.7 mg/dL (ref 1.7–2.4)

## 2023-02-10 MED ORDER — INSULIN ASPART 100 UNIT/ML IJ SOLN
3.0000 [IU] | Freq: Three times a day (TID) | INTRAMUSCULAR | Status: DC
  Administered 2023-02-10 – 2023-02-13 (×9): 3 [IU] via SUBCUTANEOUS

## 2023-02-10 MED ORDER — APIXABAN 5 MG PO TABS
5.0000 mg | ORAL_TABLET | Freq: Two times a day (BID) | ORAL | Status: DC
  Administered 2023-02-10 – 2023-02-16 (×13): 5 mg via ORAL
  Filled 2023-02-10 (×13): qty 1

## 2023-02-10 MED ORDER — INSULIN GLARGINE-YFGN 100 UNIT/ML ~~LOC~~ SOLN
14.0000 [IU] | Freq: Every day | SUBCUTANEOUS | Status: DC
  Administered 2023-02-10 – 2023-02-12 (×3): 14 [IU] via SUBCUTANEOUS
  Filled 2023-02-10 (×4): qty 0.14

## 2023-02-10 NOTE — Discharge Instructions (Addendum)
Information on my medicine - ELIQUIS (apixaban)  Why was Eliquis prescribed for you? Eliquis was prescribed for you to reduce the risk of a blood clot forming that can cause a stroke if you have a medical condition called atrial fibrillation (a type of irregular heartbeat).  What do You need to know about Eliquis ? Take your Eliquis TWICE DAILY - one tablet in the morning and one tablet in the evening with or without food. If you have difficulty swallowing the tablet whole please discuss with your pharmacist how to take the medication safely.  Take Eliquis exactly as prescribed by your doctor and DO NOT stop taking Eliquis without talking to the doctor who prescribed the medication.  Stopping may increase your risk of developing a stroke.  Refill your prescription before you run out.  After discharge, you should have regular check-up appointments with your healthcare provider that is prescribing your Eliquis.  In the future your dose may need to be changed if your kidney function or weight changes by a significant amount or as you get older.  What do you do if you miss a dose? If you miss a dose, take it as soon as you remember on the same day and resume taking twice daily.  Do not take more than one dose of ELIQUIS at the same time to make up a missed dose.  Important Safety Information A possible side effect of Eliquis is bleeding. You should call your healthcare provider right away if you experience any of the following: Bleeding from an injury or your nose that does not stop. Unusual colored urine (red or dark brown) or unusual colored stools (red or black). Unusual bruising for unknown reasons. A serious fall or if you hit your head (even if there is no bleeding).  Some medicines may interact with Eliquis and might increase your risk of bleeding or clotting while on Eliquis. To help avoid this, consult your healthcare provider or pharmacist prior to using any new prescription or  non-prescription medications, including herbals, vitamins, non-steroidal anti-inflammatory drugs (NSAIDs) and supplements.  This website has more information on Eliquis (apixaban): http://[email or web address].com/eliquis/home  FURTHER DISCHARGE INSTRUCTIONS:   Get Medicines reviewed and adjusted: Please take all your medications with you for your next visit with your Primary MD   Laboratory/radiological data: Please request your Primary MD to go over all hospital tests and procedure/radiological results at the follow up, please ask your Primary MD to get all Hospital records sent to his/her office.   In some cases, they will be blood work, cultures and biopsy results pending at the time of your discharge. Please request that your primary care M.D. goes through all the records of your hospital data and follows up on these results.   Also Note the following: If you experience worsening of your admission symptoms, develop shortness of breath, life threatening emergency, suicidal or homicidal thoughts you must seek medical attention immediately by calling 911 or calling your MD immediately  if symptoms less severe.   You must read complete instructions/literature along with all the possible adverse reactions/side effects for all the Medicines you take and that have been prescribed to you. Take any new Medicines after you have completely understood and accpet all the possible adverse reactions/side effects.    Do not drive when taking Pain medications or sleeping medications (Benzodaizepines)   Do not take more than prescribed Pain, Sleep and Anxiety Medications. It is not advisable to combine anxiety,sleep and pain medications without talking with your  primary care practitioner   Special Instructions: If you have smoked or chewed Tobacco  in the last 2 yrs please stop smoking, stop any regular Alcohol  and or any Recreational drug use.   Wear Seat belts while driving.   Please note: You were cared  for by a hospitalist during your hospital stay. Once you are discharged, your primary care physician will handle any further medical issues. Please note that NO REFILLS for any discharge medications will be authorized once you are discharged, as it is imperative that you return to your primary care physician (or establish a relationship with a primary care physician if you do not have one) for your post hospital discharge needs so that they can reassess your need for medications and monitor your lab values.

## 2023-02-10 NOTE — Plan of Care (Signed)
  Problem: Education: Goal: Knowledge of General Education information will improve Description: Including pain rating scale, medication(s)/side effects and non-pharmacologic comfort measures Outcome: Progressing   Problem: Health Behavior/Discharge Planning: Goal: Ability to manage health-related needs will improve Outcome: Progressing   Problem: Clinical Measurements: Goal: Ability to maintain clinical measurements within normal limits will improve Outcome: Progressing Goal: Will remain free from infection Outcome: Progressing Goal: Diagnostic test results will improve Outcome: Progressing Goal: Respiratory complications will improve Outcome: Progressing Goal: Cardiovascular complication will be avoided Outcome: Progressing   Problem: Nutrition: Goal: Adequate nutrition will be maintained Outcome: Progressing   Problem: Coping: Goal: Level of anxiety will decrease Outcome: Progressing   Problem: Elimination: Goal: Will not experience complications related to urinary retention Outcome: Progressing   Problem: Pain Managment: Goal: General experience of comfort will improve Outcome: Progressing   Problem: Safety: Goal: Ability to remain free from injury will improve Outcome: Progressing   Problem: Skin Integrity: Goal: Risk for impaired skin integrity will decrease Outcome: Progressing   Problem: Education: Goal: Knowledge of General Education information will improve Description: Including pain rating scale, medication(s)/side effects and non-pharmacologic comfort measures Outcome: Progressing   Problem: Health Behavior/Discharge Planning: Goal: Ability to manage health-related needs will improve Outcome: Progressing   Problem: Activity: Goal: Risk for activity intolerance will decrease Outcome: Not Progressing   Problem: Elimination: Goal: Will not experience complications related to bowel motility Outcome: Not Progressing
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
## 2023-02-10 NOTE — Evaluation (Signed)
Occupational Therapy Evaluation Patient Details Name: Troy [name] MRN: [id number] DOB: 09-18-1960 Today's Date: 02/10/2023   History of Present Illness 62 year old male with past medical history as below, which is significant for uncontrolled diabetes, peripheral vascular disease status post stenting of the right femoral artery on 8/12, cirrhosis, COPD, and HFpEF, L AKA.  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  WBAT for transers with post op shoe.   Clinical Impression   Patient admitted for the procedure above.  PTA he lives at home with his spouse, relies on wheelchair and scooter for mobility, and continued to participate with his own ADL.  Pain is the primary deficits, and there is a hx of cognitive deficits at baseline.  Patient is needing up to Mod A for basic transfers, and Min A for lower body ADL.  Patient wishes to return home post acute, and will depend if the spouse can provide the needed assist for transfers.  Patient does demonstrate the ability to scoot, but the recliner in his room is note a drop arm.  OT will continue efforts in the acute setting to address deficits.        If plan is discharge home, recommend the following: A lot of help with walking and/or transfers;A little help with bathing/dressing/bathroom;Assist for transportation;Assistance with cooking/housework;Direct supervision/assist for medications management    Functional Status Assessment  Patient has not had a recent decline in their functional status  Equipment Recommendations  None recommended by OT    Recommendations for Other Services       Precautions / Restrictions Precautions Precautions: Fall Precaution Comments: watch O2 Required Braces or Orthoses: Other Brace Other Brace: post op shoe Restrictions Weight Bearing Restrictions: Yes RLE Weight Bearing: Weight bearing as tolerated Other Position/Activity Restrictions: Transfers      Mobility Bed Mobility Overal bed mobility: Needs  Assistance Bed Mobility: Supine to Sit     Supine to sit: Supervision       Patient Response: Cooperative  Transfers Overall transfer level: Needs assistance   Transfers: Bed to chair/wheelchair/BSC     Squat pivot transfers: Mod assist              Balance Overall balance assessment: Needs assistance Sitting-balance support: Feet supported, Bilateral upper extremity supported Sitting balance-Leahy Scale: Fair                                     ADL either performed or assessed with clinical judgement   ADL               Lower Body Bathing: Minimal assistance;Sitting/lateral leans       Lower Body Dressing: Minimal assistance;Sitting/lateral leans   Toilet Transfer: Moderate assistance;Squat-pivot                   Vision Patient Visual Report: No change from baseline       Perception Perception: Within Functional Limits       Praxis Praxis: WFL       Pertinent Vitals/Pain Pain Assessment Pain Assessment: 0-10 Pain Score: 7  Pain Location: R foot Pain Descriptors / Indicators: Sharp Pain Intervention(s): Monitored during session     Extremity/Trunk Assessment Upper Extremity Assessment Upper Extremity Assessment: Generalized weakness   Lower Extremity Assessment Lower Extremity Assessment: Defer to PT evaluation   Cervical / Trunk Assessment Cervical / Trunk Assessment: Normal   Communication Communication Communication: No apparent difficulties   Cognition  Arousal: Alert Behavior During Therapy: Flat affect Overall Cognitive Status: Within Functional Limits for tasks assessed                                       General Comments   VSS on O2    Exercises     Shoulder Instructions      Home Living Family/patient expects to be discharged to:: Private residence Living Arrangements: Spouse/significant other Available Help at Discharge: Family Type of Home: House Home Access: Ramped  entrance     Home Layout: One level     Bathroom Shower/Tub: Producer, television/film/video: Handicapped height Bathroom Accessibility: Yes How Accessible: Accessible via walker Home Equipment: Rolling Walker (2 wheels);BSC/3in1;Shower seat;Grab bars - tub/shower;Wheelchair - IT trainer          Prior Functioning/Environment Prior Level of Function : Independent/Modified Independent             Mobility Comments: Using wheelchair and scooter, stating he performed stand pivot to w/c ADLs Comments: Dresses and bathes himself        OT Problem List: Decreased strength;Impaired balance (sitting and/or standing);Pain      OT Treatment/Interventions: Self-care/ADL training;Patient/family education;Balance training;DME and/or AE instruction    OT Goals(Current goals can be found in the care plan section) Acute Rehab OT Goals Patient Stated Goal: Return home OT Goal Formulation: With patient Time For Goal Achievement: 03/03/23 Potential to Achieve Goals: Fair ADL Goals Pt Will Perform Lower Body Bathing: with set-up;sitting/lateral leans Pt Will Perform Lower Body Dressing: with set-up;sitting/lateral leans Pt Will Transfer to Toilet: with supervision;squat pivot transfer  OT Frequency: Min 1X/week    Co-evaluation              AM-PAC OT "6 Clicks" Daily Activity     Outcome Measure Help from another person eating meals?: None Help from another person taking care of personal grooming?: None Help from another person toileting, which includes using toliet, bedpan, or urinal?: A Lot Help from another person bathing (including washing, rinsing, drying)?: A Little Help from another person to put on and taking off regular upper body clothing?: None Help from another person to put on and taking off regular lower body clothing?: A Little 6 Click Score: 20   End of Session Equipment Utilized During Treatment: Gait belt Nurse Communication: Mobility  status  Activity Tolerance: Patient tolerated treatment well Patient left: in chair;with call bell/phone within reach  OT Visit Diagnosis: Muscle weakness (generalized) (M62.81);Pain Pain - Right/Left: Right Pain - part of body: Ankle and joints of foot                Time: 2956-2130 OT Time Calculation (min): 24 min Charges:  OT General Charges $OT Visit: 1 Visit OT Evaluation $OT Eval Moderate Complexity: 1 Mod OT Treatments $Self Care/Home Management : 8-22 mins  02/10/2023  RP, OTR/L  Acute Rehabilitation Services  Office:  [phone]   [name] 02/10/2023, 8:55 AM

## 2023-02-10 NOTE — Progress Notes (Signed)
Inpatient Rehabilitation Admissions Coordinator   Rehab consult received in postop orders.Therapy recommending HH. We will sign off.  Ottie Glazier, RN, MSN Rehab Admissions Coordinator [phone] 02/10/2023 3:32 PM

## 2023-02-10 NOTE — Progress Notes (Signed)
Pharmacy Antibiotic Note  MELBERN CEFALO is a 62 y.o. male admitted on 02/04/2023 with new Afib and hypotension.  Pharmacy has been consulted for vancomycin and cefepime dosing for foot wound infection and rule out osteomyelitis.  Patient is also started on Flagyl.  He is now s/p T TMA 8/21 and plans are to continue antibiotics until 8/23 per notes -WBC= 16.7, afebrile, CrCl ~ 100   Plan: -Continue cefepime 2gm IV q8h -Continue vancomycin 1250mg  IV q12h -Anticipate d/c antibiotics 8/23   Height: 5\' 11"  (180.3 cm) Weight: 83.6 kg (184 lb 4.8 oz) IBW/kg (Calculated) : 75.3  Temp (24hrs), Avg:98.4 F (36.9 C), Min:97.7 F (36.5 C), Max:98.9 F (37.2 C)  Recent Labs  Lab 02/05/23 0328 02/05/23 0721 02/06/23 0241 02/06/23 0739 02/07/23 0041 02/07/23 0811 02/08/23 0121 02/09/23 0731 02/09/23 2349  WBC 14.7*  --  13.4*  --  10.9*  --  12.1* 14.4* 16.7*  CREATININE 0.55*  --  0.77   < > 0.70 0.63 0.52* 0.55* 0.73  LATICACIDVEN 2.5* 1.7  --   --   --   --   --   --   --    < > = values in this interval not displayed.    Estimated Creatinine Clearance: 102 mL/min (by C-G formula based on SCr of 0.73 mg/dL).    Allergies  Allergen Reactions   Hydromorphone Hcl Er Anaphylaxis and Other (See Comments)    Allergic to DYE in extended-release tablet, can tolerate other forms of hydromorphone   Tapentadol Anaphylaxis, Swelling and Other (See Comments)    THROAT ANGIOEDEMA Nucynta [Tapentadol Hydrochloride]   Exalamide Other (See Comments)    UNSPECIFIED REACTION     Vanc 8/17 >> Cefepime 8/17 >> Flagyl 8/17 >>  8/17 MRSA PCR - negative  Harland German, PharmD Clinical Pharmacist **Pharmacist phone directory can now be found on [email or web address] ([email or web address]).  Listed under [facility].

## 2023-02-10 NOTE — Evaluation (Signed)
Physical Therapy Evaluation Patient Details Name: Troy [name] MRN: [id number] DOB: 09/04/1960 Today's Date: 02/10/2023  History of Present Illness  62 year old male admitted 8/16 with ischemic foot on right.  Tachycardia and hypotension.  S/p right transmet amputation 8/21.  WBAT for transers with post op shoe. PMH: uncontrolled diabetes, peripheral vascular disease status post stenting of the right femoral artery on 8/12, cirrhosis, COPD, and HFpEF, L AKA.  Clinical Impression  Pt admitted with above diagnosis. Pt needing mod assist to squat pivot to bed from chair.  Pt was sitting in chair that did not have armrest that dropped therefore couldn't simulate home equipment. Obtained recliner that armrests move however pt declined getting back up.  Pt will need 24 hour care initially upon d/c.  He states wife will be with him at all times and that he has equipment. Recommend HHPT f/u. Will follow acutely.  Pt currently with functional limitations due to the deficits listed below (see PT Problem List). Pt will benefit from acute skilled PT to increase their independence and safety with mobility to allow discharge.           If plan is discharge home, recommend the following: A little help with walking and/or transfers;A little help with bathing/dressing/bathroom;Assistance with cooking/housework;Help with stairs or ramp for entrance;Assist for transportation   Can travel by private vehicle        Equipment Recommendations None recommended by PT  Recommendations for Other Services       Functional Status Assessment Patient has had a recent decline in their functional status and demonstrates the ability to make significant improvements in function in a reasonable and predictable amount of time.     Precautions / Restrictions Precautions Precautions: Fall Precaution Comments: watch O2, VAC in place Required Braces or Orthoses: Other Brace Other Brace: post op shoe Restrictions Weight  Bearing Restrictions: Yes RLE Weight Bearing: Weight bearing as tolerated Other Position/Activity Restrictions: Transfers only      Mobility  Bed Mobility                    Transfers Overall transfer level: Needs assistance   Transfers: Bed to chair/wheelchair/BSC       Squat pivot transfers: Mod assist     General transfer comment: Pt was in a recliner on arrival and arms did not drop or move on chair. Pt had to perform squat pivot and pt cannot lift buttocks enough to clear armrest without incr assist, use of gait belt and use of pad. Pt prefers scooting and this PT searched for a recliner that armrest moves and placed in room for the next time pt gets OOB.    Ambulation/Gait                  Stairs            Wheelchair Mobility     Tilt Bed    Modified Rankin (Stroke Patients Only)       Balance Overall balance assessment: Needs assistance Sitting-balance support: Feet supported, Bilateral upper extremity supported Sitting balance-Leahy Scale: Fair     Standing balance support: Bilateral upper extremity supported, During functional activity Standing balance-Leahy Scale: Zero Standing balance comment: unable to stand fully upright                             Pertinent Vitals/Pain Pain Assessment Pain Assessment: Faces Faces Pain Scale: Hurts even more Pain Location: R  foot Pain Descriptors / Indicators: Sharp Pain Intervention(s): Limited activity within patient's tolerance, Monitored during session, Premedicated before session, Repositioned    Home Living Family/patient expects to be discharged to:: Private residence Living Arrangements: Spouse/significant other Available Help at Discharge: Family Type of Home: House Home Access: Ramped entrance       Home Layout: One level Home Equipment: Agricultural consultant (2 wheels);BSC/3in1;Shower seat;Grab bars - tub/shower;Wheelchair - IT trainer      Prior  Function Prior Level of Function : Independent/Modified Independent             Mobility Comments: Using wheelchair and scooter, stating he performed stand pivot to w/c ADLs Comments: Dresses and bathes himself     Extremity/Trunk Assessment   Upper Extremity Assessment Upper Extremity Assessment: Defer to OT evaluation    Lower Extremity Assessment Lower Extremity Assessment: RLE deficits/detail RLE: Unable to fully assess due to pain    Cervical / Trunk Assessment Cervical / Trunk Assessment: Normal  Communication   Communication Communication: No apparent difficulties  Cognition Arousal: Alert Behavior During Therapy: Flat affect Overall Cognitive Status: Within Functional Limits for tasks assessed                                          General Comments General comments (skin integrity, edema, etc.): VSS    Exercises     Assessment/Plan    PT Assessment Patient needs continued PT services  PT Problem List Decreased activity tolerance;Decreased balance;Decreased mobility;Decreased knowledge of use of DME;Decreased safety awareness;Decreased knowledge of precautions;Pain       PT Treatment Interventions DME instruction;Functional mobility training;Therapeutic activities;Therapeutic exercise;Balance training;Patient/family education    PT Goals (Current goals can be found in the Care Plan section)  Acute Rehab PT Goals Patient Stated Goal: to go home PT Goal Formulation: With patient Time For Goal Achievement: 02/24/23 Potential to Achieve Goals: Good    Frequency Min 1X/week     Co-evaluation               AM-PAC PT "6 Clicks" Mobility  Outcome Measure Help needed turning from your back to your side while in a flat bed without using bedrails?: None Help needed moving from lying on your back to sitting on the side of a flat bed without using bedrails?: None Help needed moving to and from a bed to a chair (including a  wheelchair)?: A Lot Help needed standing up from a chair using your arms (e.g., wheelchair or bedside chair)?: A Lot Help needed to walk in hospital room?: Total Help needed climbing 3-5 steps with a railing? : Total 6 Click Score: 14    End of Session Equipment Utilized During Treatment: Gait belt Activity Tolerance: Patient limited by fatigue Patient left: in bed;with call bell/phone within reach;with bed alarm set Nurse Communication: Mobility status PT Visit Diagnosis: Muscle weakness (generalized) (M62.81)    Time: 1308-6578 PT Time Calculation (min) (ACUTE ONLY): 30 min   Charges:   PT Evaluation $PT Eval Moderate Complexity: 1 Mod PT Treatments $Therapeutic Activity: 8-22 mins PT General Charges $$ ACUTE PT VISIT: 1 Visit         [facility] M,PT Acute Rehab Services [phone]   [name] 02/10/2023, 1:34 PM

## 2023-02-10 NOTE — Progress Notes (Addendum)
Patient Name: Troy [name] Date of Encounter: 02/10/2023 [facility] Cardiologist: Dr Tenny Craw   Interval Summary  .    62 yr old male with PMH of HFpEF, HTN, HLD, type 2 DM, LLE AKA, PVD s/p R SFA stent, cirrhosis, prior tobacco use, who is admitted for osteomyelitis, cardiology is following for A fib/flutter RVR.   Patient states he feels fine, no heart palpitation. He wants to go home. He denied any pain from his surgical site.   Vital Signs .    Vitals:   02/09/23 2100 02/09/23 2200 02/09/23 2333 02/10/23 0305  BP: 130/66 131/73 (!) 149/74 (!) 155/73  Pulse: 82 76 85 95  Resp:  18 18 (!) 24  Temp:   98.5 F (36.9 C) 98.6 F (37 C)  TempSrc:   Oral Oral  SpO2: 94% 93% 93% 95%  Weight:    83.6 kg  Height:        Intake/Output Summary (Last 24 hours) at 02/10/2023 0824 Last data filed at 02/10/2023 9604 Gross per 24 hour  Intake 1061.32 ml  Output 710 ml  Net 351.32 ml      02/10/2023    3:05 AM 02/09/2023    1:16 PM 02/09/2023    3:35 AM  Last 3 Weights  Weight (lbs) 184 lb 4.8 oz 185 lb 3 oz 184 lb 1.4 oz  Weight (kg) 83.598 kg 84 kg 83.5 kg      Telemetry/ECG    Sinus rhythm 90s with PACs and occasional PVCs - Personally Reviewed  Physical Exam .   GEN: No acute distress.   Neck: No JVD Cardiac: RRR, no murmurs, rubs, or gallops.  Respiratory: Clear to auscultation bilaterally. On [place] oxygen  GI: Soft, nontender, non-distended  MS: L AKA; right foot in dressing and boot, not removed for exam   Assessment & Plan .     Paroxysmal A fib/flutter with RVR - TSH WNL from 02/06/23; Echo with LVEF 70-75%, no RWMA, mild LVH, grade II DD, normal RV, mild LAE, trivial MR and AI; likely mediated by infection  - hypotensive with diltiazem, converted to SR with amiodarone gtt, now transitioned to PO amiodarone, s/p 400mg  BID for 3 days starting 8/18, now on 200mg  BID for 4 more days from 8/21, then 200mg  daily after for a total 1 month, to avoid long term use  given potential toxicity with cirrhosis  - recommend  start PO Eliquis 5mg  BID when cleared by surgery given CHADS2VASc of 4, heparin gtt currently held due to pseudoaneurysm  - he wants to follow up with [facility] office but there is no close openings, arranged appt with [facility] office on 02/23/23, after that may arrange follow up with Dr Antoine Poche in [place] office    Chronic diastolic heart failure HTN - no volume overloaded today - not on loop diuretic PTA - monitor fluid status, PRN lasix if needed - BP elevated , added losartan 25mg  for HTN, not on anti-hypertensive PTA; not candidate for SGLT2I given poorly controlled DM with A1c 11.7%   Osteomyelitis of right foot COPD PAD Left common femoral artery pseudoaneurysm  Type 2 DM  Cirrhosis  - per primary/surgery team      For questions or updates, please contact [facility] Please consult [email or web address] for contact info under        Signed, Cyndi Bender, NP    I have seen and examined the patient along with Cyndi Bender, NP  .  I have reviewed the  chart, notes and new data.  I agree with PA/NP's note.  Key new complaints: feels well, asks about going home Key examination changes: NSR. L groin pseudoaneurysm successfully compressed Key new findings / data: NSR  on telemetry  PLAN: Transition from heparin to Eliquis. He is also on ASA and clopidogrel for recent R SFA stent 01/31/2023. Has reported history of cirrhosis, but normal platelet count and has never had coagulopathy. Recommend stopping the ASA after 30 days and long term treat with clopidogrel and Eliquis. [facility] will sign off.   Medication Recommendations:   Eliquis 5 mg twice daily Clopidogrel 75 mg once daily ASA 81 mg once daily stop 03/03/2023 Amiodarone 400 mg daily through 08/25, then 200 mg daily Other recommendations (labs, testing, etc):  CMET and TSH for amio monitoring in 3 months, then every 6 months Follow up as an outpatient:   02/23/2023 Azalee Course, PA   Thurmon Fair, MD, [facility] [phone] 02/10/2023, 12:36 PM

## 2023-02-10 NOTE — Progress Notes (Signed)
Patient ID: Troy [name], male   DOB: 12/23/1960, 62 y.o.   MRN: [id number] Patient is postoperative day 1 transmetatarsal amputation.  The [name] is working well.  Patient may discharge with the Praveena plus pump when safe from a medical standpoint.  He will follow-up in my office in 1 week to change the dressing.  Patient may be weightbearing as tolerated for transfers.

## 2023-02-10 NOTE — Inpatient Diabetes Management (Signed)
Inpatient Diabetes Program Recommendations  AACE/ADA: New Consensus Statement on Inpatient Glycemic Control   Target Ranges:  Prepandial:   less than 140 mg/dL      Peak postprandial:   less than 180 mg/dL (1-2 hours)      Critically ill patients:  140 - 180 mg/dL    Latest Reference Range & Units 02/09/23 07:31 02/09/23 11:52 02/09/23 13:19 02/09/23 15:11 02/09/23 15:45 02/09/23 20:54 02/10/23 07:43  Glucose-Capillary 70 - 99 mg/dL 638 (H) 756 (H) 433 (H) 139 (H) 135 (H) 353 (H) 214 (H)   Review of Glycemic Control  Current orders for Inpatient glycemic control: Semglee 10 units daily, Novolog 0-15 units TID with meals, Novolog 0-5 units QHS  Inpatient Diabetes Program Recommendations:    Insulin: Please consider increasing Semglee to 13 units daily and adding Novolog 3 units TID with meals for meal coverage if patient eats at least 50% of meals.  Outpatient: Patient has Tresiba insulin at home in the refrigerator. At time of discharge, consider ordering Tresiba 13 units daily plus [place] or [place] daily.  Thanks, Orlando Penner, RN, MSN, CDCES Diabetes Coordinator Inpatient Diabetes Program [phone] (Team Pager from 8am to 5pm)

## 2023-02-10 NOTE — Progress Notes (Addendum)
ANTICOAGULATION CONSULT NOTE - Follow Up Consult  Pharmacy Consult for heparin> apixaban Indication: atrial fibrillation  Labs: Recent Labs    02/08/23 0121 02/08/23 0850 02/08/23 1751 02/09/23 0731 02/09/23 2349  HGB 13.0 12.8* 12.5* 12.5* 12.4*  HCT 38.8* 37.5* 36.8* 36.6* 37.6*  PLT 219  --   --  253 306  HEPARINUNFRC 0.27* 0.34  --   --   --   CREATININE 0.52*  --   --  0.55* 0.73    Assessment: 62yo male with afib and with R foot osteo s/p R TMA 8/21. He was on heparin pre-op and plans are to change to apixaban. He is noted w/ PVD and had a stent placed 01/31/23 and is on aspirin and plavix (also noted on protonix)  -hg= 12.4, SCr ~ 0.7 -cost of apixaban $47 per month  Goal of Therapy:  Heparin level 0.3-0.7 units/ml   Plan:  -Start apixaban 5mg   po bid  Troy [name], PharmD Clinical Pharmacist **Pharmacist phone directory can now be found on [email or web address] ([email or web address]).  Listed under [facility].

## 2023-02-11 ENCOUNTER — Inpatient Hospital Stay (HOSPITAL_COMMUNITY): Payer: Medicare Other

## 2023-02-11 DIAGNOSIS — I4891 Unspecified atrial fibrillation: Secondary | ICD-10-CM | POA: Diagnosis not present

## 2023-02-11 LAB — BASIC METABOLIC PANEL
Anion gap: 9 (ref 5–15)
BUN: 9 mg/dL (ref 8–23)
CO2: 23 mmol/L (ref 22–32)
Calcium: 8.1 mg/dL — ABNORMAL LOW (ref 8.9–10.3)
Chloride: 98 mmol/L (ref 98–111)
Creatinine, Ser: 0.5 mg/dL — ABNORMAL LOW (ref 0.61–1.24)
GFR, Estimated: >60 mL/min (ref >60)
Glucose, Bld: 166 mg/dL — ABNORMAL HIGH (ref 70–99)
Potassium: 4 mmol/L (ref 3.5–5.1)
Sodium: 130 mmol/L — ABNORMAL LOW (ref 135–145)

## 2023-02-11 LAB — GLUCOSE, CAPILLARY
Glucose-Capillary: 177 mg/dL — ABNORMAL HIGH (ref 70–99)
Glucose-Capillary: 297 mg/dL — ABNORMAL HIGH (ref 70–99)
Glucose-Capillary: 291 mg/dL — ABNORMAL HIGH (ref 70–99)
Glucose-Capillary: 239 mg/dL — ABNORMAL HIGH (ref 70–99)

## 2023-02-11 LAB — CBC
HCT: 35.6 % — ABNORMAL LOW (ref 39.0–52.0)
Hemoglobin: 12 g/dL — ABNORMAL LOW (ref 13.0–17.0)
MCH: 28 pg (ref 26.0–34.0)
MCHC: 33.7 g/dL (ref 30.0–36.0)
MCV: 83 fL (ref 80.0–100.0)
Platelets: 295 10*3/uL (ref 150–400)
RBC: 4.29 MIL/uL (ref 4.22–5.81)
RDW: 13.4 % (ref 11.5–15.5)
WBC: 21.4 10*3/uL — ABNORMAL HIGH (ref 4.0–10.5)
nRBC: 0 % (ref 0.0–0.2)

## 2023-02-11 LAB — PROCALCITONIN: Procalcitonin: <0.1 ng/mL

## 2023-02-11 LAB — STREP PNEUMONIAE URINARY ANTIGEN: Strep Pneumo Urinary Antigen: NEGATIVE

## 2023-02-11 LAB — MAGNESIUM: Magnesium: 1.4 mg/dL — ABNORMAL LOW (ref 1.7–2.4)

## 2023-02-11 LAB — BRAIN NATRIURETIC PEPTIDE: B Natriuretic Peptide: 165.8 pg/mL — ABNORMAL HIGH (ref 0.0–100.0)

## 2023-02-11 MED ORDER — MAGNESIUM SULFATE 2 GM/50ML IV SOLN
2.0000 g | Freq: Once | INTRAVENOUS | Status: AC
  Administered 2023-02-11: 2 g via INTRAVENOUS
  Filled 2023-02-11: qty 50

## 2023-02-11 MED ORDER — SODIUM CHLORIDE 0.9 % IV SOLN
1.0000 g | INTRAVENOUS | Status: DC
  Administered 2023-02-11 – 2023-02-12 (×2): 1 g via INTRAVENOUS
  Filled 2023-02-11 (×2): qty 10

## 2023-02-11 MED ORDER — ROSUVASTATIN CALCIUM 20 MG PO TABS
20.0000 mg | ORAL_TABLET | Freq: Every day | ORAL | Status: DC
  Administered 2023-02-12 – 2023-02-16 (×5): 20 mg via ORAL
  Filled 2023-02-11 (×5): qty 1

## 2023-02-11 MED ORDER — SODIUM CHLORIDE 0.9 % IV SOLN
500.0000 mg | INTRAVENOUS | Status: DC
  Administered 2023-02-11 – 2023-02-12 (×2): 500 mg via INTRAVENOUS
  Filled 2023-02-11 (×2): qty 5

## 2023-02-11 MED ORDER — MAGNESIUM OXIDE -MG SUPPLEMENT 400 (240 MG) MG PO TABS
400.0000 mg | ORAL_TABLET | Freq: Two times a day (BID) | ORAL | Status: AC
  Administered 2023-02-11 – 2023-02-13 (×6): 400 mg via ORAL
  Filled 2023-02-11 (×6): qty 1

## 2023-02-11 MED ORDER — FUROSEMIDE 10 MG/ML IJ SOLN
20.0000 mg | Freq: Once | INTRAMUSCULAR | Status: AC
  Administered 2023-02-11: 20 mg via INTRAVENOUS
  Filled 2023-02-11: qty 2

## 2023-02-11 NOTE — TOC Progression Note (Signed)
Transition of Care [facility]) - Progression Note    Patient Details  Name: Troy [name] MRN: [id number] Date of Birth: Nov 12, 1960  Transition of Care [facility]) CM/SW Contact  Tom-Johnson, Hershal Coria, RN Phone Number: 02/11/2023, 2:40 PM  Clinical Narrative:     CM spoke with patient at bedside about  Home health recommendation. Patient has no preference. CM called in referral to [facility] and Tresa Endo voiced acceptance, info on AVS.    CM will continue to follow.        Expected Discharge Plan: Home/Self Care Barriers to Discharge: Continued Medical Work up  Expected Discharge Plan and Services   Discharge Planning Services: CM Consult   Living arrangements for the past 2 months: Single Family Home                                       Social Determinants of Health (SDOH) Interventions SDOH Screenings   Food Insecurity: No Food Insecurity (02/09/2023)  Housing: Low Risk  (02/09/2023)  Transportation Needs: No Transportation Needs (02/09/2023)  Utilities: Not At Risk (02/09/2023)  Financial Resource Strain: Low Risk  (04/09/2021)   Received from [facility], [facility]  Physical Activity: Insufficiently Active (04/09/2021)   Received from [facility], [facility]  Social Connections: Unknown (10/22/2021)   Received from [facility], [facility]  Stress: No Stress Concern Present (04/09/2021)   Received from [facility], [facility]  Tobacco Use: High Risk (02/04/2023)    Readmission Risk Interventions    08/25/2021   12:46 PM  Readmission Risk Prevention Plan  Transportation Screening Complete  Medication Review (RN Care Manager) Complete  PCP or Specialist appointment within 3-5 days of discharge Complete  HRI or Home Care Consult Complete  SW Recovery Care/Counseling Consult Complete  Palliative Care Screening Not Applicable  Skilled Nursing Facility Complete
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
## 2023-02-11 NOTE — Progress Notes (Signed)
PT Cancellation Note  Patient Details Name: Troy [name] MRN: [id number] DOB: 1960-08-07   Cancelled Treatment:    Reason Eval/Treat Not Completed: (P) Patient declined, no reason specified (Pt reporting fatigue and pain and declining mobility. Will continue to follow per PT POC.)   Johny Shock 02/11/2023, 4:15 PM

## 2023-02-12 ENCOUNTER — Inpatient Hospital Stay (HOSPITAL_COMMUNITY): Payer: Medicare Other

## 2023-02-12 DIAGNOSIS — I5033 Acute on chronic diastolic (congestive) heart failure: Secondary | ICD-10-CM

## 2023-02-12 DIAGNOSIS — J9611 Chronic respiratory failure with hypoxia: Secondary | ICD-10-CM | POA: Diagnosis not present

## 2023-02-12 DIAGNOSIS — R918 Other nonspecific abnormal finding of lung field: Secondary | ICD-10-CM | POA: Diagnosis not present

## 2023-02-12 DIAGNOSIS — D72829 Elevated white blood cell count, unspecified: Secondary | ICD-10-CM | POA: Diagnosis not present

## 2023-02-12 DIAGNOSIS — J810 Acute pulmonary edema: Secondary | ICD-10-CM

## 2023-02-12 DIAGNOSIS — I959 Hypotension, unspecified: Secondary | ICD-10-CM | POA: Diagnosis not present

## 2023-02-12 DIAGNOSIS — I4891 Unspecified atrial fibrillation: Secondary | ICD-10-CM | POA: Diagnosis not present

## 2023-02-12 DIAGNOSIS — M86471 Chronic osteomyelitis with draining sinus, right ankle and foot: Secondary | ICD-10-CM | POA: Diagnosis not present

## 2023-02-12 LAB — CBC
HCT: 37.7 % — ABNORMAL LOW (ref 39.0–52.0)
Hemoglobin: 12.4 g/dL — ABNORMAL LOW (ref 13.0–17.0)
MCH: 28.2 pg (ref 26.0–34.0)
MCHC: 32.9 g/dL (ref 30.0–36.0)
MCV: 85.7 fL (ref 80.0–100.0)
Platelets: 329 10*3/uL (ref 150–400)
RBC: 4.4 MIL/uL (ref 4.22–5.81)
RDW: 13.5 % (ref 11.5–15.5)
WBC: 23.2 10*3/uL — ABNORMAL HIGH (ref 4.0–10.5)
nRBC: 0 % (ref 0.0–0.2)

## 2023-02-12 LAB — MAGNESIUM: Magnesium: 1.8 mg/dL (ref 1.7–2.4)

## 2023-02-12 LAB — GLUCOSE, CAPILLARY
Glucose-Capillary: 254 mg/dL — ABNORMAL HIGH (ref 70–99)
Glucose-Capillary: 200 mg/dL — ABNORMAL HIGH (ref 70–99)
Glucose-Capillary: 276 mg/dL — ABNORMAL HIGH (ref 70–99)
Glucose-Capillary: 314 mg/dL — ABNORMAL HIGH (ref 70–99)
Glucose-Capillary: 232 mg/dL — ABNORMAL HIGH (ref 70–99)
Glucose-Capillary: 267 mg/dL — ABNORMAL HIGH (ref 70–99)

## 2023-02-12 LAB — BASIC METABOLIC PANEL
Anion gap: 12 (ref 5–15)
BUN: 10 mg/dL (ref 8–23)
CO2: 28 mmol/L (ref 22–32)
Calcium: 8.3 mg/dL — ABNORMAL LOW (ref 8.9–10.3)
Chloride: 94 mmol/L — ABNORMAL LOW (ref 98–111)
Creatinine, Ser: 0.56 mg/dL — ABNORMAL LOW (ref 0.61–1.24)
GFR, Estimated: >60 mL/min (ref >60)
Glucose, Bld: 173 mg/dL — ABNORMAL HIGH (ref 70–99)
Potassium: 4.2 mmol/L (ref 3.5–5.1)
Sodium: 134 mmol/L — ABNORMAL LOW (ref 135–145)

## 2023-02-12 LAB — PROCALCITONIN: Procalcitonin: <0.1 ng/mL

## 2023-02-12 MED ORDER — FUROSEMIDE 10 MG/ML IJ SOLN
40.0000 mg | Freq: Once | INTRAMUSCULAR | Status: AC
  Administered 2023-02-12: 40 mg via INTRAVENOUS
  Filled 2023-02-12: qty 4
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
## 2023-02-12 NOTE — Progress Notes (Addendum)
Occupational Therapy Treatment Patient Details Name: Troy [name] MRN: [id number] DOB: [birth year] Today's Date: 02/12/2023   History of present illness 62 year old male admitted 8/16 with ischemic foot on right.  Tachycardia and hypotension.  S/p right transmet amputation 8/21.  WBAT for transers with post op shoe. PMH: uncontrolled diabetes, peripheral vascular disease status post stenting of the right femoral artery on 8/12, cirrhosis, COPD, and HFpEF, L AKA.   OT comments  Focused session on progressing safety with functional transfers. Pt completed lateral scoot to recliner with CGA, reportedly much smoother than squat pivot. Based on his current functional status, OT recommending a drop arm BSC due to balance impairments with standing and pivoting. OT to continue to progress pt as able, DC plans remain appropriate for [facility].       If plan is discharge home, recommend the following:  A lot of help with walking and/or transfers;A little help with bathing/dressing/bathroom;Assist for transportation;Assistance with cooking/housework;Direct supervision/assist for medications management   Equipment Recommendations  BSC/3in1 (drop arm BSC only)    Recommendations for Other Services      Precautions / Restrictions Precautions Precautions: Fall Precaution Comments: watch O2, VAC in place Required Braces or Orthoses: Other Brace Other Brace: post op shoe Restrictions Weight Bearing Restrictions: Yes RLE Weight Bearing: Weight bearing as tolerated Other Position/Activity Restrictions: Transfers only       Mobility Bed Mobility Overal bed mobility: Needs Assistance Bed Mobility: Supine to Sit     Supine to sit: Supervision, Used rails     General bed mobility comments: increased time needed    Transfers Overall transfer level: Needs assistance Equipment used: None Transfers: Bed to chair/wheelchair/BSC            Lateral/Scoot Transfers: Contact guard assist General  transfer comment: Cues needed for foot positioning to allow for better body mechanics to complete transfer     Balance Overall balance assessment: Needs assistance Sitting-balance support: Feet supported, Bilateral upper extremity supported Sitting balance-Leahy Scale: Fair                                     ADL either performed or assessed with clinical judgement   ADL                                         General ADL Comments: Focused session on progressing transfer training to engage in ADLs    Extremity/Trunk Assessment              Vision       Perception     Praxis      Cognition Arousal: Alert Behavior During Therapy: Flat affect Overall Cognitive Status: Within Functional Limits for tasks assessed                                          Exercises      Shoulder Instructions       General Comments NT notified that pt needs linens changed    Pertinent Vitals/ Pain       Pain Assessment Pain Assessment: Faces Faces Pain Scale: Hurts little more Pain Location: R foot Pain Descriptors / Indicators: Sharp Pain Intervention(s): Limited activity within patient's tolerance, Monitored during  session, Repositioned  Home Living                                          Prior Functioning/Environment              Frequency  Min 1X/week        Progress Toward Goals  OT Goals(current goals can now be found in the care plan section)  Progress towards OT goals: Progressing toward goals  Acute Rehab OT Goals Patient Stated Goal: Return home OT Goal Formulation: With patient Time For Goal Achievement: 03/03/23 Potential to Achieve Goals: Fair  Plan      Co-evaluation                 AM-PAC OT "6 Clicks" Daily Activity     Outcome Measure   Help from another person eating meals?: None Help from another person taking care of personal grooming?: None Help from  another person toileting, which includes using toliet, bedpan, or urinal?: A Lot Help from another person bathing (including washing, rinsing, drying)?: A Little Help from another person to put on and taking off regular upper body clothing?: None Help from another person to put on and taking off regular lower body clothing?: A Little 6 Click Score: 20    End of Session Equipment Utilized During Treatment: Gait belt  OT Visit Diagnosis: Muscle weakness (generalized) (M62.81);Pain   Activity Tolerance Patient tolerated treatment well   Patient Left in chair;with call bell/phone within reach   Nurse Communication Mobility status (Linens need changing)        Time: 9528-4132 OT Time Calculation (min): 16 min  Charges: OT General Charges $OT Visit: 1 Visit OT Treatments $Therapeutic Activity: 8-22 mins  02/12/2023  AB, OTR/L  Acute Rehabilitation Services  Office: [phone]   Tristan Schroeder 02/12/2023, 10:25 AM

## 2023-02-12 NOTE — Progress Notes (Addendum)
      INFECTIOUS DISEASE ATTENDING ADDENDUM:   Date: 02/12/2023  Patient name: Troy [name]  Medical record number: [id number]  Date of birth: 01-07-1961   I think the findings on CT are likely pulmonary edema or some other process.  He is on AMIODARONE that can cause pulmonary toxicity  I really cannot convince myself that this man developed bilateral pneumonia ON BROAD SPECTRUM abx  His PCT fwiw is normal  I will stop antibiotics and observe and repeat CBC in am  I would also recommend asking pulmonary to review scan  Paulette Blanch Dam 02/12/2023, 2:12 PM

## 2023-02-12 NOTE — Progress Notes (Signed)
NAME:  Troy [name], MRN:  [id number], DOB:  May 15, 1961, LOS: 8 ADMISSION DATE:  02/04/2023, RE-CONSULTATION DATE:  02/12/2023 REFERRING MD:  Dr. Jonathon Bellows, [facility], CHIEF COMPLAINT:  Hypoxia   History of Present Illness:  62 yo male former smoker presented to ER with fast heart rate from atrial fibrillation/flutter.  Treated with adenosine in ER, started on cardizem, and seen by cardiology.  Then started on amiodarone and heparin infusions.  Seen by orthopedics for Rt foot osteomyelitis and had Rt TMA.  Developed hypoxia with pulmonary infiltrates on 8/24 and PCCM consulted.  Pertinent  Medical History  HFpEF, DM type 2, HTN, HLD, s/p Lt AKA, Rt lower leg ucler s/p Rt SFA stent, NASH with Cirrhosis, COVID PNA March 2023 with necrotizing pneumonia and loculated Lt effusion, Anxiety with depression, PTSD, Insomnia, Neuropathy  Significant Hospital Events: Including procedures, antibiotic start and stop dates in addition to other pertinent events   8/16 Admit, cardiology consulted 8/19 ortho consulted for Rt foot osteomyelitis 8/20 vascular surgery consulted for Lt common femoral pseudoaneurysm 8/21 Rt TMA 8/22 cardiology sign off 8/24 started on oxygen, ID consulted  Studies:  PFT 06/07/16 >> FEV1 2.65 (66%), FEV1% 85, TLC 5.26 (71%), DLCO 73% A1AT 06/07/16 >> 154, MS Echo 02/06/23 >> EF 70 to 75%, grade 2 DD, mild LVH, mild LA dilation CT chest 02/12/23 >> 1.2 cm metal foreign body in LUL, small Rt effusion, bilateral hazy GGO with central distribution  Interim History / Subjective:  Denies chest pain.  Has dry cough.  Objective   Blood pressure 113/64, pulse 86, temperature 98.5 F (36.9 C), temperature source Oral, resp. rate (!) 27, height 5\' 11"  (1.803 m), weight 79.6 kg, SpO2 90%.        Intake/Output Summary (Last 24 hours) at 02/12/2023 1433 Last data filed at 02/12/2023 6967 Gross per 24 hour  Intake 350 ml  Output 600 ml  Net -250 ml   Filed Weights   02/10/23 0305 02/11/23  0309 02/12/23 0335  Weight: 83.6 kg 80.7 kg 79.6 kg    Examination:  General - alert Eyes - pupils reactive ENT - no sinus tenderness, no stridor Cardiac - regular rate/rhythm, no murmur Chest - bilateral crackles Abdomen - soft, non tender, + bowel sounds Extremities - Lt AKA, Rt TMA Skin - no rashes Neuro - normal strength, moves extremities, follows commands Psych - normal mood and behavior   Discussion:  He has acute onset of hypoxia and pulmonary infiltrates. Radiographic findings are more suggestive of pulmonary edema.  This would be fitting with acute HFpEF in setting of A fib/flutter with RVR, osteomyelitis with recent surgery, and relative volume excess.  Normal procalcitonin speaks against bacterial infection, and he doesn't have other symptoms that would be suggestive of a viral infectious process.  Other possibility is that he could have pulmonary toxicity from recently being started on amiodarone.  Assessment & Plan:   Acute hypoxic respiratory failure. - oxygen to keep SpO2 > 92% - f/u CXR, BNP, ESR  Atrial fibrillation/flutter. Acute on chronic HFpEF. - will give an additional 40 mg lasix IV x one in PM of 8/24 - might need to transition off amiodarone if pulmonary infiltrates don't improve with additional diuresis  Rt foot osteomyelitis and 1st to 5th metatarsal head necrotic ulceration s/p Rt TMA with peripheral artery disease and diabetes. - per primary team, ortho, and ID  Best Practice (right click and "Reselect all SmartList Selections" daily)   Diet/type: Regular consistency (see orders) DVT  prophylaxis: DOAC GI prophylaxis: PPI Lines: N/A Foley:  N/A Code Status:  full code Last date of multidisciplinary goals of care discussion [updated his family at bedside]  Labs       Latest Ref Rng & Units 02/12/2023    3:02 AM 02/11/2023    3:37 AM 02/09/2023   11:49 PM  CMP  Glucose 70 - 99 mg/dL 161  096  045   BUN 8 - 23 mg/dL 10  9  10    Creatinine  0.61 - 1.24 mg/dL 4.09  8.11  9.14   Sodium 135 - 145 mmol/L 134  130  134   Potassium 3.5 - 5.1 mmol/L 4.2  4.0  3.9   Chloride 98 - 111 mmol/L 94  98  97   CO2 22 - 32 mmol/L 28  23  26    Calcium 8.9 - 10.3 mg/dL 8.3  8.1  8.0        Latest Ref Rng & Units 02/12/2023    3:02 AM 02/11/2023    3:37 AM 02/09/2023   11:49 PM  CBC  WBC 4.0 - 10.5 K/uL 23.2  21.4  16.7   Hemoglobin 13.0 - 17.0 g/dL 78.2  95.6  21.3   Hematocrit 39.0 - 52.0 % 37.7  35.6  37.6   Platelets 150 - 400 K/uL 329  295  306     ABG    Component Value Date/Time   HCO3 33.3 (H) 12/12/2021 2018   TCO2 29 01/31/2023 1307   O2SAT 45 12/12/2021 2018    BNP (last 3 results) Recent Labs    02/05/23 0328 02/11/23 0336  BNP 298.2* 165.8*   Signature:  Coralyn Helling, MD [facility] Pager - [phone] or [phone] 02/12/2023, 4:49 PM

## 2023-02-13 ENCOUNTER — Inpatient Hospital Stay (HOSPITAL_COMMUNITY): Payer: Medicare Other

## 2023-02-13 DIAGNOSIS — R918 Other nonspecific abnormal finding of lung field: Secondary | ICD-10-CM | POA: Diagnosis not present

## 2023-02-13 DIAGNOSIS — I4891 Unspecified atrial fibrillation: Secondary | ICD-10-CM | POA: Diagnosis not present

## 2023-02-13 LAB — BASIC METABOLIC PANEL
Anion gap: 10 (ref 5–15)
BUN: 13 mg/dL (ref 8–23)
CO2: 25 mmol/L (ref 22–32)
Calcium: 8 mg/dL — ABNORMAL LOW (ref 8.9–10.3)
Chloride: 94 mmol/L — ABNORMAL LOW (ref 98–111)
Creatinine, Ser: 0.66 mg/dL (ref 0.61–1.24)
GFR, Estimated: >60 mL/min (ref >60)
Glucose, Bld: 268 mg/dL — ABNORMAL HIGH (ref 70–99)
Potassium: 3.8 mmol/L (ref 3.5–5.1)
Sodium: 129 mmol/L — ABNORMAL LOW (ref 135–145)

## 2023-02-13 LAB — CBC WITH DIFFERENTIAL/PLATELET
Abs Immature Granulocytes: 0.17 10*3/uL — ABNORMAL HIGH (ref 0.00–0.07)
Basophils Absolute: 0.1 10*3/uL (ref 0.0–0.1)
Basophils Relative: 0 %
Eosinophils Absolute: 0.4 10*3/uL (ref 0.0–0.5)
Eosinophils Relative: 2 %
HCT: 35.1 % — ABNORMAL LOW (ref 39.0–52.0)
Hemoglobin: 11.5 g/dL — ABNORMAL LOW (ref 13.0–17.0)
Immature Granulocytes: 1 %
Lymphocytes Relative: 16 %
Lymphs Abs: 2.9 10*3/uL (ref 0.7–4.0)
MCH: 27.3 pg (ref 26.0–34.0)
MCHC: 32.8 g/dL (ref 30.0–36.0)
MCV: 83.4 fL (ref 80.0–100.0)
Monocytes Absolute: 2 10*3/uL — ABNORMAL HIGH (ref 0.1–1.0)
Monocytes Relative: 11 %
Neutro Abs: 12.8 10*3/uL — ABNORMAL HIGH (ref 1.7–7.7)
Neutrophils Relative %: 70 %
Platelets: 359 10*3/uL (ref 150–400)
RBC: 4.21 MIL/uL — ABNORMAL LOW (ref 4.22–5.81)
RDW: 13.5 % (ref 11.5–15.5)
WBC: 18.4 10*3/uL — ABNORMAL HIGH (ref 4.0–10.5)
nRBC: 0 % (ref 0.0–0.2)

## 2023-02-13 LAB — MAGNESIUM: Magnesium: 1.6 mg/dL — ABNORMAL LOW (ref 1.7–2.4)

## 2023-02-13 LAB — GLUCOSE, CAPILLARY
Glucose-Capillary: 238 mg/dL — ABNORMAL HIGH (ref 70–99)
Glucose-Capillary: 166 mg/dL — ABNORMAL HIGH (ref 70–99)
Glucose-Capillary: 172 mg/dL — ABNORMAL HIGH (ref 70–99)
Glucose-Capillary: 267 mg/dL — ABNORMAL HIGH (ref 70–99)

## 2023-02-13 LAB — BRAIN NATRIURETIC PEPTIDE: B Natriuretic Peptide: 54.6 pg/mL (ref 0.0–100.0)

## 2023-02-13 LAB — SEDIMENTATION RATE: Sed Rate: 110 mm/h — ABNORMAL HIGH (ref 0–16)

## 2023-02-13 MED ORDER — FUROSEMIDE 10 MG/ML IJ SOLN
40.0000 mg | Freq: Three times a day (TID) | INTRAMUSCULAR | Status: AC
  Administered 2023-02-13 (×2): 40 mg via INTRAVENOUS
  Filled 2023-02-13 (×2): qty 4

## 2023-02-13 MED ORDER — INSULIN ASPART 100 UNIT/ML IJ SOLN
5.0000 [IU] | Freq: Three times a day (TID) | INTRAMUSCULAR | Status: DC
  Administered 2023-02-13 – 2023-02-14 (×2): 5 [IU] via SUBCUTANEOUS

## 2023-02-13 MED ORDER — INSULIN GLARGINE-YFGN 100 UNIT/ML ~~LOC~~ SOLN
20.0000 [IU] | Freq: Every day | SUBCUTANEOUS | Status: DC
  Administered 2023-02-13: 20 [IU] via SUBCUTANEOUS
  Filled 2023-02-13 (×2): qty 0.2

## 2023-02-13 MED ORDER — AMIODARONE HCL 200 MG PO TABS
200.0000 mg | ORAL_TABLET | Freq: Every day | ORAL | Status: DC
  Administered 2023-02-14: 200 mg via ORAL
  Filled 2023-02-13: qty 1

## 2023-02-13 NOTE — Progress Notes (Addendum)
Mobility Specialist Progress Note    02/13/23 1241  Mobility  Activity Transferred from bed to chair  Level of Assistance Contact guard assist, steadying assist  Assistive Device Other (Comment) (HHA)  Activity Response Tolerated well  Mobility Referral Yes  $Mobility charge 1 Mobility  Mobility Specialist Start Time (ACUTE ONLY) 1216  Mobility Specialist Stop Time (ACUTE ONLY) 1240  Mobility Specialist Time Calculation (min) (ACUTE ONLY) 24 min   Pre-Mobility: 92 HR Post-Mobility: 102 HR  Pt received in bed and agreeable. C/o 7/10 pain. Completed lateral scoot. Left with call bell in reach and chair alarm on. RN present.   [place] Mobility Specialist  Please Neurosurgeon or Rehab Office at [phone]

## 2023-02-13 NOTE — Progress Notes (Signed)
Patient worked with Network engineer and RN and sitting up in the chair. RN gave patient a bath.

## 2023-02-13 NOTE — Progress Notes (Signed)
NAME:  Troy [name], MRN:  [id number], DOB:  December 16, 1960, LOS: 9 ADMISSION DATE:  02/04/2023, RE-CONSULTATION DATE:  02/12/2023 REFERRING MD:  Dr. Jonathon Bellows, [facility], CHIEF COMPLAINT:  Hypoxia   History of Present Illness:  62 yo male former smoker presented to ER with fast heart rate from atrial fibrillation/flutter.  Treated with adenosine in ER, started on cardizem, and seen by cardiology.  Then started on amiodarone and heparin infusions.  Seen by orthopedics for Rt foot osteomyelitis and had Rt TMA.  Developed hypoxia with pulmonary infiltrates on 8/24 and PCCM consulted.  Pertinent  Medical History  HFpEF, DM type 2, HTN, HLD, s/p Lt AKA, Rt lower leg ucler s/p Rt SFA stent, NASH with Cirrhosis, COVID PNA March 2023 with necrotizing pneumonia and loculated Lt effusion, Anxiety with depression, PTSD, Insomnia, Neuropathy  Significant Hospital Events: Including procedures, antibiotic start and stop dates in addition to other pertinent events   8/16 Admit, cardiology consulted 8/19 ortho consulted for Rt foot osteomyelitis 8/20 vascular surgery consulted for Lt common femoral pseudoaneurysm 8/21 Rt TMA 8/22 cardiology sign off 8/24 started on oxygen, ID consulted  Studies:  PFT 06/07/16 >> FEV1 2.65 (66%), FEV1% 85, TLC 5.26 (71%), DLCO 73% A1AT 06/07/16 >> 154, MS Echo 02/06/23 >> EF 70 to 75%, grade 2 DD, mild LVH, mild LA dilation CT chest 02/12/23 >> 1.2 cm metal foreign body in LUL, small Rt effusion, bilateral hazy GGO with central distribution  Interim History / Subjective:  Breathing improved some.  No chest pain.  Anxious to go home.  CXR today shows improvement in infiltrates.  Objective   Blood pressure (!) 152/81, pulse (!) 101, temperature 98.9 F (37.2 C), temperature source Oral, resp. rate 20, height 5\' 11"  (1.803 m), weight 81.6 kg, SpO2 92%.        Intake/Output Summary (Last 24 hours) at 02/13/2023 1123 Last data filed at 02/13/2023 0544 Gross per 24 hour  Intake 860 ml   Output 1875 ml  Net -1015 ml   Filed Weights   02/11/23 0309 02/12/23 0335 02/13/23 0518  Weight: 80.7 kg 79.6 kg 81.6 kg    Examination:  General - alert Eyes - pupils reactive ENT - no sinus tenderness, no stridor Cardiac - regular rate/rhythm, no murmur Chest - better air movement, now only faint basilar crackles Abdomen - soft, non tender, + bowel sounds Extremities - Lt AKA, Rt TMA Skin - no rashes Neuro - normal strength, moves extremities, follows commands Psych - normal mood and behavior   Discussion:  He has acute onset of hypoxia and pulmonary infiltrates. Radiographic findings are more suggestive of pulmonary edema.  This would be fitting with acute HFpEF in setting of A fib/flutter with RVR, osteomyelitis with recent surgery, and relative volume excess.  Normal procalcitonin speaks against bacterial infection, and he doesn't have other symptoms that would be suggestive of a viral infectious process.  Other possibility is that he could have pulmonary toxicity from recently being started on amiodarone.  He has some clinical and radiographic improvement with diuresis.  ESR is elevated, but this is non-specific and could be related to recent infection and surgery.  Assessment & Plan:   Acute hypoxic respiratory failure with pulmonary infiltrates. - oxygen to keep SpO2 > 92% - f/u CXR 8/26  Atrial fibrillation/flutter. Acute on chronic HFpEF. - will give additional 40 mg lasix IV q8h x two doses on 8/25 - might need to transition off amiodarone if pulmonary infiltrates don't improve with additional diuresis  Hyponatremia. - f/u BMET  Rt foot osteomyelitis and 1st to 5th metatarsal head necrotic ulceration s/p Rt TMA with peripheral artery disease and diabetes. - per primary team, ortho, and ID  Best Practice (right click and "Reselect all SmartList Selections" daily)   Diet/type: Regular consistency (see orders) DVT prophylaxis: DOAC GI prophylaxis: PPI Lines:  N/A Foley:  N/A Code Status:  full code Last date of multidisciplinary goals of care discussion [updated his family at bedside]  Labs       Latest Ref Rng & Units 02/13/2023    3:08 AM 02/12/2023    3:02 AM 02/11/2023    3:37 AM  CMP  Glucose 70 - 99 mg/dL 782  956  213   BUN 8 - 23 mg/dL 13  10  9    Creatinine 0.61 - 1.24 mg/dL 0.86  5.78  4.69   Sodium 135 - 145 mmol/L 129  134  130   Potassium 3.5 - 5.1 mmol/L 3.8  4.2  4.0   Chloride 98 - 111 mmol/L 94  94  98   CO2 22 - 32 mmol/L 25  28  23    Calcium 8.9 - 10.3 mg/dL 8.0  8.3  8.1        Latest Ref Rng & Units 02/13/2023    3:08 AM 02/12/2023    3:02 AM 02/11/2023    3:37 AM  CBC  WBC 4.0 - 10.5 K/uL 18.4  23.2  21.4   Hemoglobin 13.0 - 17.0 g/dL 62.9  52.8  41.3   Hematocrit 39.0 - 52.0 % 35.1  37.7  35.6   Platelets 150 - 400 K/uL 359  329  295     ABG    Component Value Date/Time   HCO3 33.3 (H) 12/12/2021 2018   TCO2 29 01/31/2023 1307   O2SAT 45 12/12/2021 2018    BNP (last 3 results) Recent Labs    02/05/23 0328 02/11/23 0336 02/13/23 0308  BNP 298.2* 165.8* 54.6   Signature:  Coralyn Helling, MD [facility] Pager - [phone] or [phone] 02/13/2023, 11:23 AM
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
## 2023-02-13 NOTE — Progress Notes (Signed)
       Date: 02/13/2023  Patient name: Troy [name]  Medical record number: [id number]  Date of birth: [birth year]   Patient's white blood cell count is downtrending and I have stopped antibacterial antibiotics I do not think he has an active bacterial infection at this point in time.  See note from yesterday.  I will sign off for now please call with further questions.   Acey Lav 02/13/2023, 1:37 PM

## 2023-02-14 ENCOUNTER — Telehealth (HOSPITAL_COMMUNITY): Payer: Self-pay | Admitting: Pharmacy Technician

## 2023-02-14 ENCOUNTER — Other Ambulatory Visit (HOSPITAL_COMMUNITY): Payer: Self-pay

## 2023-02-14 ENCOUNTER — Inpatient Hospital Stay (HOSPITAL_COMMUNITY): Payer: Medicare Other

## 2023-02-14 DIAGNOSIS — R918 Other nonspecific abnormal finding of lung field: Secondary | ICD-10-CM | POA: Diagnosis not present

## 2023-02-14 DIAGNOSIS — I4891 Unspecified atrial fibrillation: Secondary | ICD-10-CM | POA: Diagnosis not present

## 2023-02-14 DIAGNOSIS — J9611 Chronic respiratory failure with hypoxia: Secondary | ICD-10-CM | POA: Diagnosis not present

## 2023-02-14 LAB — CBC
HCT: 37.1 % — ABNORMAL LOW (ref 39.0–52.0)
Hemoglobin: 12.9 g/dL — ABNORMAL LOW (ref 13.0–17.0)
MCH: 28 pg (ref 26.0–34.0)
MCHC: 34.8 g/dL (ref 30.0–36.0)
MCV: 80.5 fL (ref 80.0–100.0)
Platelets: 447 10*3/uL — ABNORMAL HIGH (ref 150–400)
RBC: 4.61 MIL/uL (ref 4.22–5.81)
RDW: 13.2 % (ref 11.5–15.5)
WBC: 17.7 10*3/uL — ABNORMAL HIGH (ref 4.0–10.5)
nRBC: 0 % (ref 0.0–0.2)

## 2023-02-14 LAB — BASIC METABOLIC PANEL
Anion gap: 10 (ref 5–15)
BUN: 17 mg/dL (ref 8–23)
CO2: 28 mmol/L (ref 22–32)
Calcium: 8.8 mg/dL — ABNORMAL LOW (ref 8.9–10.3)
Chloride: 96 mmol/L — ABNORMAL LOW (ref 98–111)
Creatinine, Ser: 0.56 mg/dL — ABNORMAL LOW (ref 0.61–1.24)
GFR, Estimated: >60 mL/min (ref >60)
Glucose, Bld: 243 mg/dL — ABNORMAL HIGH (ref 70–99)
Potassium: 4.9 mmol/L (ref 3.5–5.1)
Sodium: 134 mmol/L — ABNORMAL LOW (ref 135–145)

## 2023-02-14 LAB — GLUCOSE, CAPILLARY
Glucose-Capillary: 243 mg/dL — ABNORMAL HIGH (ref 70–99)
Glucose-Capillary: 224 mg/dL — ABNORMAL HIGH (ref 70–99)
Glucose-Capillary: 207 mg/dL — ABNORMAL HIGH (ref 70–99)
Glucose-Capillary: 280 mg/dL — ABNORMAL HIGH (ref 70–99)

## 2023-02-14 MED ORDER — INSULIN GLARGINE-YFGN 100 UNIT/ML ~~LOC~~ SOLN
24.0000 [IU] | Freq: Every day | SUBCUTANEOUS | Status: DC
  Administered 2023-02-14: 24 [IU] via SUBCUTANEOUS
  Filled 2023-02-14 (×2): qty 0.24

## 2023-02-14 MED ORDER — METHYLPREDNISOLONE SODIUM SUCC 125 MG IJ SOLR
80.0000 mg | Freq: Two times a day (BID) | INTRAMUSCULAR | Status: DC
  Administered 2023-02-14 – 2023-02-16 (×4): 80 mg via INTRAVENOUS
  Filled 2023-02-14 (×4): qty 2

## 2023-02-14 MED ORDER — INSULIN ASPART 100 UNIT/ML IJ SOLN
8.0000 [IU] | Freq: Three times a day (TID) | INTRAMUSCULAR | Status: DC
  Administered 2023-02-14 – 2023-02-15 (×3): 8 [IU] via SUBCUTANEOUS
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
## 2023-02-14 NOTE — Telephone Encounter (Signed)
Pharmacy Patient Advocate Encounter   Received notification  that prior authorization for FreeStyle Libre 3 Sensoris required/requested.   Insurance verification completed.   The patient is insured through [facility] .   Per test claim: PA required; PA submitted to [facility] via CoverMyMeds Key/confirmation #/EOC [facility] Status is pending

## 2023-02-14 NOTE — Progress Notes (Signed)
Physical Therapy Treatment Patient Details Name: Troy [name] MRN: [id number] DOB: 1960-12-28 Today's Date: 02/14/2023   History of Present Illness 62 year old male admitted 8/16 with ischemic foot on right.  Tachycardia and hypotension.  S/p right transmet amputation 8/21.  WBAT for transers with post op shoe. PMH: uncontrolled diabetes, peripheral vascular disease status post stenting of the right femoral artery on 8/12, cirrhosis, COPD, and HFpEF, L AKA.    PT Comments  Pt admitted with above diagnosis. Pt was able to perform some exercises with PT. Declined to get OOB as he states he got up earlier with nursing and his wife.  Will continue to follow acutely.  Pt currently with functional limitations due to the deficits listed below (see PT Problem List). Pt will benefit from acute skilled PT to increase their independence and safety with mobility to allow discharge.       If plan is discharge home, recommend the following: A little help with walking and/or transfers;A little help with bathing/dressing/bathroom;Assistance with cooking/housework;Help with stairs or ramp for entrance;Assist for transportation   Can travel by private vehicle        Equipment Recommendations  None recommended by PT    Recommendations for Other Services       Precautions / Restrictions Precautions Precautions: Fall Precaution Comments: watch O2, VAC in place Required Braces or Orthoses: Other Brace Other Brace: post op shoe Restrictions Weight Bearing Restrictions: Yes RLE Weight Bearing: Weight bearing as tolerated Other Position/Activity Restrictions: Transfers only     Mobility  Bed Mobility Overal bed mobility: Needs Assistance Bed Mobility: Supine to Sit     Supine to sit: Supervision, Used rails     General bed mobility comments: increased time needed    Transfers                   General transfer comment: declined to get OOB today stating he got up earlier with nursing  and his wife.    Ambulation/Gait                   Stairs             Wheelchair Mobility     Tilt Bed    Modified Rankin (Stroke Patients Only)       Balance Overall balance assessment: Needs assistance Sitting-balance support: Feet supported, Bilateral upper extremity supported Sitting balance-Leahy Scale: Fair                                      Cognition Arousal: Alert Behavior During Therapy: Flat affect Overall Cognitive Status: Within Functional Limits for tasks assessed                                          Exercises General Exercises - Lower Extremity Ankle Circles/Pumps: AROM, Both, 10 reps, Supine Quad Sets: AROM, Both, 10 reps, Supine Long Arc Quad: AROM, Both, 10 reps, Seated Heel Slides: AROM, Both, 10 reps, Supine Straight Leg Raises: AROM, Both, 10 reps, Supine    General Comments General comments (skin integrity, edema, etc.): VSS with 3LO2 in place      Pertinent Vitals/Pain Pain Assessment Pain Assessment: Faces Faces Pain Scale: Hurts little more Pain Location: R foot Pain Descriptors / Indicators: Sharp Pain Intervention(s): Limited activity within patient's tolerance,  Monitored during session, Repositioned    Home Living                          Prior Function            PT Goals (current goals can now be found in the care plan section) Acute Rehab PT Goals Patient Stated Goal: to go home Progress towards PT goals: Progressing toward goals    Frequency    Min 1X/week      PT Plan      Co-evaluation              AM-PAC PT "6 Clicks" Mobility   Outcome Measure  Help needed turning from your back to your side while in a flat bed without using bedrails?: None Help needed moving from lying on your back to sitting on the side of a flat bed without using bedrails?: None Help needed moving to and from a bed to a chair (including a wheelchair)?: A  Little Help needed standing up from a chair using your arms (e.g., wheelchair or bedside chair)?: A Lot Help needed to walk in hospital room?: Total Help needed climbing 3-5 steps with a railing? : Total 6 Click Score: 15    End of Session Equipment Utilized During Treatment: Gait belt;Oxygen Activity Tolerance: Patient limited by fatigue Patient left: in bed;with call bell/phone within reach;with bed alarm set Nurse Communication: Mobility status PT Visit Diagnosis: Muscle weakness (generalized) (M62.81)     Time: 4540-9811 PT Time Calculation (min) (ACUTE ONLY): 8 min  Charges:    $Therapeutic Exercise: 8-22 mins PT General Charges $$ ACUTE PT VISIT: 1 Visit                     [name] M,PT Acute Rehab Services [phone]    [name] 02/14/2023, 3:46 PM

## 2023-02-14 NOTE — Telephone Encounter (Signed)
Pharmacy Patient Advocate Encounter  Received notification from [facility] that Prior Authorization for FreeStyle Libre 3 Sensor  has been APPROVED from 02/14/2023 to 06/21/2023. Ran test claim, Copay is $0.00. This test claim was processed through [facility]- copay amounts may vary at other pharmacies due to pharmacy/plan contracts, or as the patient moves through the different stages of their insurance plan.   PA #/Case ID/Reference #: [id number]

## 2023-02-14 NOTE — Inpatient Diabetes Management (Addendum)
Inpatient Diabetes Program Recommendations  AACE/ADA: New Consensus Statement on Inpatient Glycemic Control   Target Ranges:  Prepandial:   less than 140 mg/dL      Peak postprandial:   less than 180 mg/dL (1-2 hours)      Critically ill patients:  140 - 180 mg/dL    Latest Reference Range & Units 02/13/23 07:56 02/13/23 11:55 02/13/23 16:06 02/13/23 21:25 02/14/23 08:19  Glucose-Capillary 70 - 99 mg/dL 161 (H) 096 (H) 045 (H) 267 (H) 243 (H)   Review of Glycemic Control  Diabetes history: DM2 Outpatient Diabetes medications: None Current orders for Inpatient glycemic control: Semglee 24 units daily, Novolog 0-15 units TID with meals, Novolog 0-5 units at bedtime, Novolog 8 units TID with meals  Inpatient Diabetes Program Recommendations:    Outpatient: Patient has [place] insulin at home in the refrigerator. At time of discharge, consider ordering Evaristo Bury for basal insulin plus [place] or [place] daily.   NOTE: Patient admitted on 02/05/23 with atrial flutter, hypotension, poorly controlled DM, hypokalemia, severe LVH, cirrhosis, acidosis, and chronic lower extremity pain. Inpatient diabetes coordinator spoke with patient on 02/08/23 (has taken insulin in the past and agrees to take insulin again) and on 02/09/23 diabetes coordinator spoke with patient and his wife (wife takes insulin (via pens) herself) and patient had taken [place] insulin and still has [place] insulin in the refrigerator at home. Will plan to follow up with patient again today.  Addendum 02/14/23@14 :20-Spoke with patient at bedside. Patient is agreeable to use insulin at home again as he has used it in the past.  Patient states he has [place] insulin at home in fridge. Spoke with patient about using FreeStyle Libre3 CGM sensor for glucose monitoring. Explained how FreeStyle Libre3 sensor works and would allow more data for his providers to use to continue to make adjustments with DM medications.  Patient states he will talk  to his wife about the FreeStyle Libre3 CGM; states he is not sure he wants to use it. His cell phone is not compatible with the app. Informed patient that they make a FreeStyle Libre3 reader device that could be prescribed to read the sensor since his phone is not compatible. Will plan to see patient  again in the morning to follow up about the FreeStyle Libre3 CGM.  If patient is agreeable to use FreeStyle Libre3 as an outpatient, inpatient diabetes team will teach patient how to use it but he would not be able to start it until he is discharged and gets the reader device.  Sent chat message to Dr. Jonathon Bellows to make aware.  Thanks, Orlando Penner, RN, MSN, CDCES Diabetes Coordinator Inpatient Diabetes Program [phone] (Team Pager from 8am to 5pm)

## 2023-02-14 NOTE — TOC Benefit Eligibility Note (Signed)
Patient Product/process development scientist completed.    The patient is insured through [facility]. Patient has Medicare and is not eligible for a copay card, but may be able to apply for patient assistance, if available.    Ran test claim for [employer] and Requires Prior Authorization  Ran test claim for [employer] and Requires Prior Authorization  This test claim was processed through [employer]- copay amounts may vary at other pharmacies due to [employer], or as the patient moves through the different stages of their insurance plan.     Roland Earl, CPHT Pharmacy Technician III Certified Patient Advocate [facility] Patient Advocate Team Direct Number: [phone]  Fax: [phone]
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
## 2023-02-14 NOTE — Progress Notes (Addendum)
NAME:  Troy [name], MRN:  [id number], DOB:  02-19-1961, LOS: 10 ADMISSION DATE:  02/04/2023, RE-CONSULTATION DATE:  02/12/2023 REFERRING MD:  Dr. Jonathon Bellows, [facility], CHIEF COMPLAINT:  Hypoxia   History of Present Illness:  62 yo male former smoker presented to ER with fast heart rate from atrial fibrillation/flutter.  Treated with adenosine in ER, started on cardizem, and seen by cardiology.  Then started on amiodarone and heparin infusions.  Seen by orthopedics for Rt foot osteomyelitis and had Rt TMA.  Developed hypoxia with pulmonary infiltrates on 8/24 and PCCM consulted.  Pertinent  Medical History  HFpEF, DM type 2, HTN, HLD, s/p Lt AKA, Rt lower leg ucler s/p Rt SFA stent, NASH with Cirrhosis, COVID PNA March 2023 with necrotizing pneumonia and loculated Lt effusion, Anxiety with depression, PTSD, Insomnia, Neuropathy  Significant Hospital Events: Including procedures, antibiotic start and stop dates in addition to other pertinent events   8/16 Admit, cardiology consulted 8/19 ortho consulted for Rt foot osteomyelitis 8/20 vascular surgery consulted for Lt common femoral pseudoaneurysm 8/21 Rt transmetatarsal amputation  8/22 cardiology sign off 8/24 started on oxygen, ID consulted 8/26 Increased supplemental oxygen demand, no in 4L [place]. CXR with multifocal opacities   Interim History / Subjective:  Flat affect, response "yeah' with all orientation questions   Objective   Blood pressure 113/67, pulse 92, temperature 98.3 F (36.8 C), temperature source Oral, resp. rate 15, height 5\' 11"  (1.803 m), weight 77.6 kg, SpO2 96%.        Intake/Output Summary (Last 24 hours) at 02/14/2023 0919 Last data filed at 02/14/2023 0300 Gross per 24 hour  Intake 595 ml  Output 1880 ml  Net -1285 ml   Filed Weights   02/12/23 0335 02/13/23 0518 02/14/23 0514  Weight: 79.6 kg 81.6 kg 77.6 kg    Examination: General: Acute on chronically ill appearing middle aged male lying in bed, in NAD HEENT:  [place]/AT, MM pink/moist, PERRL,  Neuro: Flat affect, non-focal CV: s1s2 regular rate and rhythm, no murmur, rubs, or gallops,  PULM:  Diminished bilaterally, no increased work of breathing, on 4L [place]  GI: soft, bowel sounds active in all 4 quadrants, non-tender, non-distended, tolerating oral diet  Extremities: warm/dry, no edema  Skin: no rashes or lesions  Assessment & Plan:   Acute hypoxic respiratory failure with pulmonary infiltrates. He has acute onset of hypoxia and pulmonary infiltrates. Radiographic findings are more suggestive of pulmonary edema however on physical exam patient appears euvolemic.  Normal procalcitonin speaks against bacterial infection, and he doesn't have other symptoms that would be suggestive of a viral infectious process.  Other possibility is that he could have pulmonary toxicity from recently being started on amiodarone.  He has some clinical and radiographic improvement with diuresis.  ESR is elevated at 110 8/25, but this is non-specific and could be related to recent infection and surgery. P: Start IV solumedrol 80mg  q12 for possible pulmonary toxicity in the setting of amio Cariology re-consulted as below  Wean oxygen as able  Mobilize  Pulmonary hygiene   Atrial fibrillation/flutter. Acute on chronic HFpEF. P: Cariology re-consulted to see if amion could be stopped in favor of beta blocker  Daily assessment for need to diureses  Strict intake and output   Rt foot osteomyelitis and 1st to 5th metatarsal head necrotic ulceration s/p Rt TMA with peripheral artery disease and diabetes. P: Management per primary team   Best Practice (right click and "Reselect all SmartList Selections" daily)  Diet/type: Regular consistency (see orders) DVT prophylaxis: DOAC GI prophylaxis: PPI Lines: N/A Foley:  N/A Code Status:  full code Last date of multidisciplinary goals of care discussion [updated his family at bedside]  Signature:  Whitney D. Harris,  NP-C [facility] Personal contact information can be found on Amion  If no contact or response made please call [phone] 02/14/2023, 9:40 AM  Attending portion:  Remains on supplemental oxygen.  CXR still shows infiltrates.  BNP down to 54.6.  ESR 110.  BP 130/71 (BP Location: Right Arm)   Pulse 70   Temp 98.1 F (36.7 C) (Oral)   Resp 20   Ht 5\' 11"  (1.803 m)   Wt 77.6 kg   SpO2 99%   BMI 23.85 kg/m   General - alert Eyes - pupils reactive ENT - no sinus tenderness, no stridor Cardiac - regular rate/rhythm, no murmur Chest - bilateral crackles Abdomen - soft, non tender, + bowel sounds Extremities - Lt AKA, Rt TMA Skin - no rashes Neuro - moves extremities, follows commands Psych - normal mood and behavior  Assessment/plan:  Acute pneumonitis. - has good diuresis, but still needing supplemental oxygen and has persistent pulmonary infiltrates - while he likely had pulmonary edema from acute HFpEF, I am concerned he could have inflammatory process possible from recent addition of amiodarone - will ask cardiology to assess whether he can be switched to alternative agent for a fib/flutter - will start solumedrol 80 mg q12h  I spent > 50% of time assessing pt and formulating treatment plan.  Coralyn Helling, MD [facility] Pager - [phone] or [phone] 02/14/2023, 1:18 PM

## 2023-02-15 ENCOUNTER — Encounter (HOSPITAL_COMMUNITY): Payer: Self-pay | Admitting: Orthopedic Surgery

## 2023-02-15 DIAGNOSIS — R918 Other nonspecific abnormal finding of lung field: Secondary | ICD-10-CM | POA: Diagnosis not present

## 2023-02-15 DIAGNOSIS — I4891 Unspecified atrial fibrillation: Secondary | ICD-10-CM | POA: Diagnosis not present

## 2023-02-15 LAB — LEGIONELLA PNEUMOPHILA SEROGP 1 UR AG: L. pneumophila Serogp 1 Ur Ag: NEGATIVE

## 2023-02-15 LAB — GLUCOSE, CAPILLARY
Glucose-Capillary: 336 mg/dL — ABNORMAL HIGH (ref 70–99)
Glucose-Capillary: 399 mg/dL — ABNORMAL HIGH (ref 70–99)
Glucose-Capillary: 474 mg/dL — ABNORMAL HIGH (ref 70–99)
Glucose-Capillary: 337 mg/dL — ABNORMAL HIGH (ref 70–99)

## 2023-02-15 MED ORDER — BUPIVACAINE-EPINEPHRINE (PF) 0.5% -1:200000 IJ SOLN
INTRAMUSCULAR | Status: DC | PRN
Start: 2023-02-09 — End: 2023-02-15
  Administered 2023-02-09: 25 mL via PERINEURAL

## 2023-02-15 MED ORDER — INSULIN ASPART 100 UNIT/ML IJ SOLN
12.0000 [IU] | Freq: Three times a day (TID) | INTRAMUSCULAR | Status: DC
  Administered 2023-02-15 (×2): 12 [IU] via SUBCUTANEOUS

## 2023-02-15 MED ORDER — INSULIN GLARGINE-YFGN 100 UNIT/ML ~~LOC~~ SOLN
30.0000 [IU] | Freq: Every day | SUBCUTANEOUS | Status: DC
  Administered 2023-02-15: 30 [IU] via SUBCUTANEOUS
  Filled 2023-02-15 (×2): qty 0.3

## 2023-02-15 NOTE — Plan of Care (Signed)
  Problem: Activity: Goal: Ability to return to baseline activity level will improve Outcome: Progressing   Problem: Cardiovascular: Goal: Ability to achieve and maintain adequate cardiovascular perfusion will improve Outcome: Progressing Goal: Vascular access site(s) Level 0-1 will be maintained Outcome: Progressing   Problem: Education: Goal: Knowledge of General Education information will improve Description: Including pain rating scale, medication(s)/side effects and non-pharmacologic comfort measures Outcome: Progressing   Problem: Clinical Measurements: Goal: Ability to maintain clinical measurements within normal limits will improve Outcome: Progressing Goal: Will remain free from infection Outcome: Progressing Goal: Diagnostic test results will improve Outcome: Progressing Goal: Respiratory complications will improve Outcome: Progressing Goal: Cardiovascular complication will be avoided Outcome: Progressing

## 2023-02-15 NOTE — Progress Notes (Signed)
   NAME:  Troy [name], MRN:  [id number], DOB:  [birth year], LOS: 11 ADMISSION DATE:  02/04/2023, RE-CONSULTATION DATE:  02/12/2023 REFERRING MD:  Dr. Jonathon Bellows, [facility], CHIEF COMPLAINT:  Hypoxia   History of Present Illness:  62 yo male former smoker presented to ER with fast heart rate from atrial fibrillation/flutter.  Treated with adenosine in ER, started on cardizem, and seen by cardiology.  Then started on amiodarone and heparin infusions.  Seen by orthopedics for Rt foot osteomyelitis and had Rt TMA.  Developed hypoxia with pulmonary infiltrates on 8/24 and PCCM consulted.  Pertinent  Medical History  HFpEF, DM type 2, HTN, HLD, s/p Lt AKA, Rt lower leg ucler s/p Rt SFA stent, NASH with Cirrhosis, COVID PNA March 2023 with necrotizing pneumonia and loculated Lt effusion, Anxiety with depression, PTSD, Insomnia, Neuropathy  Significant Hospital Events: Including procedures, antibiotic start and stop dates in addition to other pertinent events   8/16 Admit, cardiology consulted 8/19 ortho consulted for Rt foot osteomyelitis 8/20 vascular surgery consulted for Lt common femoral pseudoaneurysm 8/21 Rt transmetatarsal amputation  8/22 cardiology sign off 8/24 started on oxygen, ID consulted 8/26 start steroids, stop amiodarone  Interim History / Subjective:  Breathing better.  Now on room air.  Objective   Blood pressure (!) 144/78, pulse 99, temperature 97.8 F (36.6 C), temperature source Oral, resp. rate 15, height 5\' 11"  (1.803 m), weight 75.5 kg, SpO2 97%.        Intake/Output Summary (Last 24 hours) at 02/15/2023 1332 Last data filed at 02/15/2023 0004 Gross per 24 hour  Intake --  Output 600 ml  Net -600 ml   Filed Weights   02/13/23 0518 02/14/23 0514 02/15/23 0516  Weight: 81.6 kg 77.6 kg 75.5 kg    Examination:  General - alert Eyes - pupils reactive ENT - no sinus tenderness, no stridor Cardiac - regular rate/rhythm, no murmur Chest - no more crackles Abdomen - soft,  non tender, + bowel sounds Extremities - Lt AKA, Rt TMA Skin - no rashes Neuro - normal strength, moves extremities, follows commands Psych - normal mood and behavior  Discussion:   He has acute onset of hypoxia and pulmonary infiltrates. Radiographic findings are more suggestive of pulmonary edema.  This would be fitting with acute HFpEF in setting of A fib/flutter with RVR, osteomyelitis with recent surgery, and relative volume excess.  Normal procalcitonin speaks against bacterial infection, and he doesn't have other symptoms that would be suggestive of a viral infectious process.  Other possibility is that he could have pulmonary toxicity from recently being started on amiodarone.  He has some clinical and radiographic improvement with diuresis, but had persistent hypoxia and crackles on exam.  ESR is elevated.  Much improved after started steroids and stopping amiodarone on 8/26.   Assessment/Plan:   Acute hypoxic respiratory failure with pulmonary infiltrates. - oxygen to keep SpO2 > 92% - f/u CXR 8/28 - continue IV solumedrol - if stable, then transition to prednisone on 8/28 and can continue taper as an outpt   Atrial fibrillation/flutter. Acute on chronic HFpEF. - monitor off amiodarone   Steroid induced hyperglycemia. - per primary team   Rt foot osteomyelitis and 1st to 5th metatarsal head necrotic ulceration s/p Rt TMA with peripheral artery disease and diabetes. - per primary team, ortho, and ID  Signature:  Coralyn Helling, MD [facility] Pulmonary/Critical Care Pager - [phone] or [phone] 02/15/2023, 1:32 PM

## 2023-02-15 NOTE — TOC Progression Note (Signed)
Transition of Care [facility]) - Progression Note    Patient Details  Name: Troy [name] MRN: [id number] Date of Birth: 02-Sep-1960  [facility] CM/SW Contact  Graves-Bigelow, Lamar Laundry, RN Phone Number: 02/15/2023, 10:26 AM  Clinical Narrative: Case Manager spoke with patient and spouse at the bedside regarding disposition needs. Previous Case Manager arranged Home Health PT with [facility]. Patient declined bsc today. Patient is without PCP and spouse states she will arrange PCP visit with NP Gwendlyn Deutscher. HH orders can be signed by vascular moving forward and [facility] is aware. Praveena vac to be placed on the patient. No further home needs identified at this time.   Expected Discharge Plan: Home w Home Health Services Barriers to Discharge: Continued Medical Work up  Expected Discharge Plan and Services   Discharge Planning Services: CM Consult   Living arrangements for the past 2 months: Single Family Home     HH Arranged: RN [facility] Agency: [facility] Date [facility] Agency Contacted: 02/15/23 Time HH Agency Contacted: 1023 Representative spoke with at [facility] Agency: Tresa Endo  Social Determinants of Health (SDOH) Interventions SDOH Screenings   Food Insecurity: No Food Insecurity (02/09/2023)  Housing: Low Risk  (02/09/2023)  Transportation Needs: No Transportation Needs (02/09/2023)  Utilities: Not At Risk (02/09/2023)  Financial Resource Strain: Low Risk  (04/09/2021)   Received from [facility], [facility]  Physical Activity: Insufficiently Active (04/09/2021)   Received from [facility], [facility]  Social Connections: Unknown (10/22/2021)   Received from [facility], [facility]  Stress: No Stress Concern Present (04/09/2021)   Received from [facility], [facility]  Tobacco Use: High Risk (02/04/2023)   Readmission Risk Interventions    08/25/2021   12:46 PM  Readmission Risk Prevention Plan  Transportation Screening Complete   Medication Review (RN Care Manager) Complete  PCP or Specialist appointment within 3-5 days of discharge Complete  HRI or Home Care Consult Complete  SW Recovery Care/Counseling Consult Complete  Palliative Care Screening Not Applicable  Skilled Nursing Facility Complete

## 2023-02-15 NOTE — Progress Notes (Signed)
Occupational Therapy Treatment Patient Details Name: Troy [name] MRN: [id number] DOB: Jan 29, 1961 Today's Date: 02/15/2023   History of present illness 62 year old male admitted 8/16 with ischemic foot on right.  Tachycardia and hypotension.  S/p right transmet amputation 8/21.  WBAT for transers with post op shoe. PMH: uncontrolled diabetes, peripheral vascular disease status post stenting of the right femoral artery on 8/12, cirrhosis, COPD, and HFpEF, L AKA.   OT comments  Patient sitting in bedside chair  up on arrival and agreeable to participate in OT intervention this date.  Patient completed sit to stand with min A for overall safety and to push from seated surface and patient completed standing oral hygiene with CGA.Patient returned to siting in bedside chair with CGA and verbal cues to reach back for seated surface before attempting to sit.  Patient would benefit from additional OT intervention to address functional deficits in order for patient to return to PLOF.      If plan is discharge home, recommend the following:  A lot of help with walking and/or transfers;A little help with bathing/dressing/bathroom;Assist for transportation;Assistance with cooking/housework;Direct supervision/assist for medications management   Equipment Recommendations  BSC/3in1    Recommendations for Other Services      Precautions / Restrictions Precautions Precautions: Fall Precaution Comments: watch O2, VAC in place Required Braces or Orthoses: Other Brace Other Brace: R post op shoe Restrictions Weight Bearing Restrictions: Yes RLE Weight Bearing: Weight bearing as tolerated Other Position/Activity Restrictions: transfers only       Mobility Bed Mobility               General bed mobility comments: patient sitting in bedside chair upon arrival into room    Transfers Overall transfer level: Needs assistance Equipment used: Rolling walker (2 wheels) Transfers: Sit to/from Stand,  Bed to chair/wheelchair/BSC Sit to Stand: Min assist (with verbal cues for overall safety and to push from seated surface) Stand pivot transfers: Contact guard assist Squat pivot transfers: Min assist             Balance Overall balance assessment: Needs assistance Sitting-balance support: Feet supported, Bilateral upper extremity supported Sitting balance-Leahy Scale: Good                                     ADL either performed or assessed with clinical judgement   ADL Overall ADL's : Needs assistance/impaired Eating/Feeding: Set up   Grooming: Oral care;Standing                               Functional mobility during ADLs: Minimal assistance;Rolling walker (2 wheels)      Extremity/Trunk Assessment Upper Extremity Assessment Upper Extremity Assessment: Overall WFL for tasks assessed            Vision       Perception     Praxis      Cognition Arousal: Alert Behavior During Therapy: Flat affect Overall Cognitive Status: Within Functional Limits for tasks assessed                                          Exercises      Shoulder Instructions       General Comments VSS on room air    Pertinent  Vitals/ Pain       Pain Assessment Pain Location: R foot Pain Intervention(s): Monitored during session  Home Living                                          Prior Functioning/Environment              Frequency  Min 1X/week        Progress Toward Goals  OT Goals(current goals can now be found in the care plan section)  Progress towards OT goals: Progressing toward goals  Acute Rehab OT Goals OT Goal Formulation: With patient Time For Goal Achievement: 03/03/23 Potential to Achieve Goals: Fair ADL Goals Pt Will Perform Lower Body Bathing: with set-up;sitting/lateral leans Pt Will Perform Lower Body Dressing: with set-up;sitting/lateral leans Pt Will Transfer to Toilet: with  supervision;squat pivot transfer  Plan      Co-evaluation                 AM-PAC OT "6 Clicks" Daily Activity     Outcome Measure   Help from another person eating meals?: None Help from another person taking care of personal grooming?: None Help from another person toileting, which includes using toliet, bedpan, or urinal?: A Lot Help from another person bathing (including washing, rinsing, drying)?: A Little Help from another person to put on and taking off regular upper body clothing?: None Help from another person to put on and taking off regular lower body clothing?: A Little 6 Click Score: 20    End of Session Equipment Utilized During Treatment: Gait belt  OT Visit Diagnosis: Muscle weakness (generalized) (M62.81);Pain   Activity Tolerance Patient tolerated treatment well   Patient Left in chair;with call bell/phone within reach   Nurse Communication Mobility status        Time: 0981-1914 OT Time Calculation (min): 21 min  Charges: OT General Charges $OT Visit: 1 Visit OT Treatments $Self Care/Home Management : 8-22 mins  Governor Specking OT/L Denice Paradise 02/15/2023, 2:27 PM
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
## 2023-02-15 NOTE — Anesthesia Procedure Notes (Signed)
Anesthesia Regional Block: Popliteal block   Pre-Anesthetic Checklist: , timeout performed,  Correct Patient, Correct Site, Correct Laterality,  Correct Procedure, Correct Position, site marked,  Risks and benefits discussed,  Surgical consent,  Pre-op evaluation,  At surgeon's request and post-op pain management  Laterality: Right and Lower  Prep: chloraprep       Needles:  Injection technique: Single-shot      Needle Length: 9cm  Needle Gauge: 22     Additional Needles: Arrow StimuQuik ECHO Echogenic Stimulating PNB Needle  Procedures:,,,, ultrasound used (permanent image in chart),,    Narrative:  Start time: 02/09/2023 1:49 PM End time: 02/09/2023 1:56 PM Injection made incrementally with aspirations every 5 mL.  Performed by: Personally  Anesthesiologist: Val Eagle, MD

## 2023-02-15 NOTE — Progress Notes (Addendum)
Physical Therapy Treatment Patient Details Name: Troy [name] MRN: [id number] DOB: [birth year] Today's Date: 02/15/2023   History of Present Illness 62 year old male admitted 8/16 with ischemic foot on right.  Tachycardia and hypotension.  S/p right transmet amputation 8/21.  WBAT for transers with post op shoe. PMH: uncontrolled diabetes, peripheral vascular disease status post stenting of the right femoral artery on 8/12, cirrhosis, COPD, and HFpEF, L AKA.    PT Comments  Pt progressed well in today's session with needing supervision for lateral scoot transfer and CGA with RW for stand pivot transfer. Pt was able to perform 5 STS with RW and CGA with cues for upright posture. Pt showing R LE fatigue after 3 STS. Pt reports feeling good about discharging home. Pt would continue to benefit from skilled PT. Acute PT to follow.     If plan is discharge home, recommend the following: A little help with walking and/or transfers;A little help with bathing/dressing/bathroom;Assistance with cooking/housework;Help with stairs or ramp for entrance;Assist for transportation   Can travel by private vehicle        Equipment Recommendations  None recommended by PT    Recommendations for Other Services       Precautions / Restrictions Precautions Precautions: Fall Precaution Comments: watch O2, VAC in place Required Braces or Orthoses: Other Brace Other Brace: R post op shoe Restrictions Weight Bearing Restrictions: Yes RLE Weight Bearing: Weight bearing as tolerated Other Position/Activity Restrictions: transfers only     Mobility  Bed Mobility Overal bed mobility: Needs Assistance Bed Mobility: Supine to Sit, Sit to Supine     Supine to sit: HOB elevated, Supervision Sit to supine: Supervision   General bed mobility comments: increased time needed    Transfers Overall transfer level: Needs assistance Equipment used: Rolling walker (2 wheels) Transfers: Sit to/from Stand, Bed to  chair/wheelchair/BSC Sit to Stand: Contact guard assist Stand pivot transfers: Contact guard assist        Lateral/Scoot Transfers: Supervision General transfer comment: needed cues to only pivot to adhere to WB precautions, verbal cues for standing fully upright          Balance Overall balance assessment: Needs assistance Sitting-balance support: Feet supported, Bilateral upper extremity supported Sitting balance-Leahy Scale: Good     Standing balance support: Bilateral upper extremity supported, During functional activity Standing balance-Leahy Scale: Fair Standing balance comment: able to stand fully upright with verbal cues, ~15 sec                            Cognition Arousal: Alert Behavior During Therapy: Flat affect Overall Cognitive Status: Within Functional Limits for tasks assessed                                          Exercises General Exercises - Lower Extremity Long Arc Quad: AROM, Right, 15 reps Hip ABduction/ADduction: AROM, Both, Seated, 15 reps Hip Flexion/Marching: AROM, Both, Seated, 15 reps Other Exercises Other Exercises: sit to stands from EOB, 5 reps with RW    General Comments General comments (skin integrity, edema, etc.): VSS on room air      Pertinent Vitals/Pain Pain Assessment Pain Assessment: Faces Faces Pain Scale: Hurts little more Pain Location: R foot Pain Descriptors / Indicators: Sharp Pain Intervention(s): Limited activity within patient's tolerance, Monitored during session    Home Living  Prior Function            PT Goals (current goals can now be found in the care plan section) Progress towards PT goals: Progressing toward goals    Frequency    Min 1X/week      PT Plan      Co-evaluation              AM-PAC PT "6 Clicks" Mobility   Outcome Measure  Help needed turning from your back to your side while in a flat bed without using  bedrails?: None Help needed moving from lying on your back to sitting on the side of a flat bed without using bedrails?: None Help needed moving to and from a bed to a chair (including a wheelchair)?: A Little Help needed standing up from a chair using your arms (e.g., wheelchair or bedside chair)?: A Little Help needed to walk in hospital room?: Total Help needed climbing 3-5 steps with a railing? : Total 6 Click Score: 16    End of Session   Activity Tolerance: Patient tolerated treatment well Patient left: in bed;with call bell/phone within reach;with bed alarm set;with family/visitor present Nurse Communication: Mobility status PT Visit Diagnosis: Muscle weakness (generalized) (M62.81)     Time: 1610-9604 PT Time Calculation (min) (ACUTE ONLY): 23 min  Charges:    $Therapeutic Exercise: 8-22 mins $Therapeutic Activity: 8-22 mins PT General Charges $$ ACUTE PT VISIT: 1 Visit                     Hilton Cork, PT, DPT Secure Chat Preferred  Rehab Office [phone]    Arturo Morton Brion Aliment 02/15/2023, 1:46 PM

## 2023-02-15 NOTE — Inpatient Diabetes Management (Signed)
Inpatient Diabetes Program Recommendations  AACE/ADA: New Consensus Statement on Inpatient Glycemic Control   Target Ranges:  Prepandial:   less than 140 mg/dL      Peak postprandial:   less than 180 mg/dL (1-2 hours)      Critically ill patients:  140 - 180 mg/dL    Latest Reference Range & Units 02/14/23 08:19 02/14/23 12:18 02/14/23 16:04 02/14/23 20:28 02/15/23 07:18  Glucose-Capillary 70 - 99 mg/dL 161 (H) 096 (H) 045 (H) 280 (H) 336 (H)   Review of Glycemic Control  Diabetes history: DM2 Outpatient Diabetes medications: None Current orders for Inpatient glycemic control: Semglee 30 units daily, Novolog 0-15 units TID with meals, Novolog 0-5 units at bedtime, Novolog 12 units TID with meals; Solumedrol 80 mg Q12H   Inpatient Diabetes Program Recommendations:     Insulin: Hyperglycemia Noted insulin changes made today.  Outpatient: Patient has Tresiba insulin at home in the refrigerator. At time of discharge, consider ordering Evaristo Bury for basal insulin plus [place] or [place] daily.  At discharge, please provide Rx for FreeStyle Libre3 sensors [phone]) and reader [phone]).   NOTE: Patient admitted on 02/05/23 with atrial flutter, hypotension, poorly controlled DM, hypokalemia, severe LVH, cirrhosis, acidosis, and chronic lower extremity pain. Inpatient diabetes coordinator spoke with patient on 02/08/23 (has taken insulin in the past and agrees to take insulin again) and on 02/09/23 diabetes coordinator spoke with patient and his wife (wife takes insulin (via pens) herself) and patient had taken [place] insulin and still has [place] insulin in the refrigerator at home.  Spoke with patient and wife at bedside. [name] confirms that they have [place] insulin at home in the refrigerator that is still in date. [name] states she would like patient to use the FreeStyle Libre3 sensor for glucose monitoring. Explained that patient's phone is not compatible with using the FreeStyle  Libre3 app so patient will need the FreeStyle Libre3 reader device to read the sensor. Educated patient and wife on FreeStyle Libre3 CGM regarding application and changing CGM sensor (alternate every 14 days on back of arms), 1 hour warm-up, how to scan sensor to start a new sensor, and how to use app to check glucose.  Had patient's wife watch YouTube video on how to apply FreeStyle Libre3 sensor.  Patient has been given Freestyle Libre3 sensor sample.  Provided educational packet regarding FreeStyle Libre3 CGM.  Patient will need Rx for FreeStyle [place] 3 reader. Informed patient that it would be requested that attending provider provide Rx for first month of FreeStyle Libre3 sensors and that she have PCP continue to provide Rx for FreeStyle Libre3 sensors going forward. Asked patient to be sure to let PCP know about Radene Journey and allow provider to review reports from [place] app so the provider can use the information to continue to make adjustments with DM medications if needed. Patient and wife verbalized understanding of information and have no questions at this time.  Thanks, Orlando Penner, RN, MSN, CDCES Diabetes Coordinator Inpatient Diabetes Program [phone] (Team Pager from 8am to 5pm)

## 2023-02-15 NOTE — Anesthesia Postprocedure Evaluation (Signed)
Anesthesia Post Note  Patient: Troy [name]  Procedure(s) Performed: RIGHT TRANSMETATARSAL AMPUTATION (Right: Foot)     Patient location during evaluation: PACU Anesthesia Type: Regional and MAC Level of consciousness: awake and alert Pain management: pain level controlled Vital Signs Assessment: post-procedure vital signs reviewed and stable Respiratory status: spontaneous breathing, nonlabored ventilation, respiratory function stable and patient connected to nasal cannula oxygen Cardiovascular status: stable and blood pressure returned to baseline Postop Assessment: no apparent nausea or vomiting Anesthetic complications: no   No notable events documented.  Last Vitals:  Vitals:   02/15/23 0700 02/15/23 0800  BP:    Pulse:  99  Resp: 17 15  Temp:    SpO2:  97%    Last Pain:  Vitals:   02/15/23 1105  TempSrc:   PainSc: 0-No pain                 [name]

## 2023-02-16 ENCOUNTER — Telehealth: Payer: Self-pay | Admitting: Pulmonary Disease

## 2023-02-16 ENCOUNTER — Inpatient Hospital Stay (HOSPITAL_COMMUNITY): Payer: Medicare Other

## 2023-02-16 DIAGNOSIS — R918 Other nonspecific abnormal finding of lung field: Secondary | ICD-10-CM | POA: Diagnosis not present

## 2023-02-16 DIAGNOSIS — I4891 Unspecified atrial fibrillation: Secondary | ICD-10-CM | POA: Diagnosis not present

## 2023-02-16 LAB — GLUCOSE, CAPILLARY
Glucose-Capillary: 461 mg/dL — ABNORMAL HIGH (ref 70–99)
Glucose-Capillary: 473 mg/dL — ABNORMAL HIGH (ref 70–99)
Glucose-Capillary: 374 mg/dL — ABNORMAL HIGH (ref 70–99)

## 2023-02-16 MED ORDER — APIXABAN 5 MG PO TABS: 5.0000 mg | ORAL_TABLET | Freq: Two times a day (BID) | ORAL | 0 refills | Status: DC

## 2023-02-16 MED ORDER — "PEN NEEDLES 3/16"" 31G X 5 MM MISC": 3 refills | Status: AC

## 2023-02-16 MED ORDER — INSULIN DEGLUDEC 100 UNIT/ML ~~LOC~~ SOPN
40.0000 [IU] | PEN_INJECTOR | Freq: Every day | SUBCUTANEOUS | 2 refills | Status: AC
Start: 2023-02-16 — End: 2023-05-17

## 2023-02-16 MED ORDER — ROSUVASTATIN CALCIUM 20 MG PO TABS: 20.0000 mg | ORAL_TABLET | Freq: Every day | ORAL | 0 refills | Status: DC

## 2023-02-16 MED ORDER — PREDNISONE 20 MG PO TABS
30.0000 mg | ORAL_TABLET | Freq: Every day | ORAL | Status: DC
  Administered 2023-02-16: 30 mg via ORAL
  Filled 2023-02-16: qty 1

## 2023-02-16 MED ORDER — PANTOPRAZOLE SODIUM 40 MG PO TBEC: 40.0000 mg | DELAYED_RELEASE_TABLET | Freq: Every day | ORAL | 0 refills | Status: AC

## 2023-02-16 MED ORDER — INSULIN LISPRO (1 UNIT DIAL) 100 UNIT/ML (KWIKPEN)
18.0000 [IU] | PEN_INJECTOR | Freq: Three times a day (TID) | SUBCUTANEOUS | 2 refills | Status: AC
Start: 2023-02-16 — End: 2023-05-17

## 2023-02-16 MED ORDER — EMPAGLIFLOZIN 10 MG PO TABS: 10.0000 mg | ORAL_TABLET | Freq: Every day | ORAL | 0 refills | Status: DC

## 2023-02-16 MED ORDER — LOSARTAN POTASSIUM 25 MG PO TABS: 25.0000 mg | ORAL_TABLET | Freq: Every day | ORAL | 0 refills | Status: DC

## 2023-02-16 MED ORDER — FREESTYLE LIBRE 3 SENSOR MISC: 3 refills | Status: AC

## 2023-02-16 MED ORDER — INSULIN ASPART 100 UNIT/ML IJ SOLN
18.0000 [IU] | Freq: Three times a day (TID) | INTRAMUSCULAR | Status: DC
  Administered 2023-02-16 (×2): 18 [IU] via SUBCUTANEOUS

## 2023-02-16 MED ORDER — PREDNISONE 10 MG PO TABS: ORAL_TABLET | ORAL | 0 refills | Status: AC

## 2023-02-16 MED ORDER — FREESTYLE LIBRE 3 READER DEVI: 0 refills | Status: AC

## 2023-02-16 MED ORDER — INSULIN GLARGINE-YFGN 100 UNIT/ML ~~LOC~~ SOLN
40.0000 [IU] | Freq: Every day | SUBCUTANEOUS | Status: DC
  Administered 2023-02-16: 40 [IU] via SUBCUTANEOUS
  Filled 2023-02-16 (×2): qty 0.4

## 2023-02-16 MED ORDER — ASPIRIN 81 MG PO TBEC: 81.0000 mg | DELAYED_RELEASE_TABLET | Freq: Every day | ORAL | 0 refills | Status: DC
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
## 2023-02-16 NOTE — Telephone Encounter (Signed)
Patient is scheduled for 03/21/2023 with Dr. Wynona Neat. Reminder mailed to address on file.

## 2023-02-16 NOTE — Progress Notes (Signed)
NAME:  Troy [name], MRN:  [id number], DOB:  [birth year], LOS: 12 ADMISSION DATE:  02/04/2023, RE-CONSULTATION DATE:  02/12/2023 REFERRING MD:  Dr. Jonathon Bellows, [facility], CHIEF COMPLAINT:  Hypoxia   History of Present Illness:  62 yo male former smoker presented to ER with fast heart rate from atrial fibrillation/flutter.  Treated with adenosine in ER, started on cardizem, and seen by cardiology.  Then started on amiodarone and heparin infusions.  Seen by orthopedics for Rt foot osteomyelitis and had Rt TMA.  Developed hypoxia with pulmonary infiltrates on 8/24 and PCCM consulted.  Pertinent  Medical History  HFpEF, DM type 2, HTN, HLD, s/p Lt AKA, Rt lower leg ucler s/p Rt SFA stent, NASH with Cirrhosis, COVID PNA March 2023 with necrotizing pneumonia and loculated Lt effusion, Anxiety with depression, PTSD, Insomnia, Neuropathy  Significant Hospital Events: Including procedures, antibiotic start and stop dates in addition to other pertinent events   8/16 Admit, cardiology consulted 8/19 ortho consulted for Rt foot osteomyelitis 8/20 vascular surgery consulted for Lt common femoral pseudoaneurysm 8/21 Rt transmetatarsal amputation  8/22 cardiology sign off 8/24 started on oxygen, ID consulted 8/26 start steroids, stop amiodarone 8/27 no longer needing supplemental oxygen  Interim History / Subjective:  Has remained on room air.  No cough, wheeze, sputum, or chest pain.  CXR looks much better.  Objective   Blood pressure 129/72, pulse 63, temperature 99.6 F (37.6 C), temperature source Oral, resp. rate 20, height 5\' 11"  (1.803 m), weight 78.6 kg, SpO2 95%.        Intake/Output Summary (Last 24 hours) at 02/16/2023 0754 Last data filed at 02/15/2023 2101 Gross per 24 hour  Intake --  Output 650 ml  Net -650 ml   Filed Weights   02/14/23 0514 02/15/23 0516 02/16/23 0405  Weight: 77.6 kg 75.5 kg 78.6 kg    Examination:  General - alert Eyes - pupils reactive ENT - no sinus tenderness,  no stridor Cardiac - regular rate/rhythm, no murmur Chest - no more crackles Abdomen - soft, non tender, + bowel sounds Extremities - Lt AKA, Rt TMA Skin - no rashes Neuro - normal strength, moves extremities, follows commands Psych - normal mood and behavior  Discussion:  He has acute onset of hypoxia and pulmonary infiltrates. Radiographic findings are more suggestive of pulmonary edema.  This would be fitting with acute HFpEF in setting of A fib/flutter with RVR, osteomyelitis with recent surgery, and relative volume excess.  Normal procalcitonin speaks against bacterial infection, and he doesn't have other symptoms that would be suggestive of a viral infectious process.  Other possibility is that he could have pulmonary toxicity from recently being started on amiodarone.  He has some clinical and radiographic improvement with diuresis, but had persistent hypoxia and crackles on exam with persistent infiltrates.  ESR is elevated.  Much improved after started steroids and stopping amiodarone on 8/26, and able to transition off supplemental oxygen after this.   Assessment/Plan:   Steroid responsive pulmonary infiltrates, possibly related to amiodarone. - change to prednisone 30 mg daily on 8/28 - continue prednisone 30 mg daily for 7 days, then change to 20 mg daily for 7 days, then 10 mg daily for 7 days - will arrange for outpt pulmonary follow up in next 3 to 4 weeks  Atrial fibrillation/flutter. Acute on chronic HFpEF. - monitor off amiodarone   Steroid induced hyperglycemia. - per primary team   Rt foot osteomyelitis and 1st to 5th metatarsal head necrotic ulceration s/p  Rt TMA with peripheral artery disease and diabetes. - per primary team, ortho, and ID  Updated his family at bedside  Okay for d/c home from pulmonary standpoint.  Signature:  Coralyn Helling, MD [facility] Pulmonary/Critical Care Pager - [phone] or [phone] 02/16/2023, 7:54 AM

## 2023-02-16 NOTE — Progress Notes (Addendum)
Patient is on room air. Supplemental oxygen last given/needed on 02/14/2023 in the late morning. Oxygen saturation ranges from 96-98% on room air. Breathing regular and unlabored. Patient denies any dyspnea.

## 2023-02-16 NOTE — Care Management Important Message (Signed)
Important Message  Patient Details  Name: Troy [name] MRN: [id number] Date of Birth: 1960-09-21   Medicare Important Message Given:  Yes     Renie Ora 02/16/2023, 11:43 AM

## 2023-02-16 NOTE — Inpatient Diabetes Management (Signed)
Inpatient Diabetes Program Recommendations  AACE/ADA: New Consensus Statement on Inpatient Glycemic Control   Target Ranges:  Prepandial:   less than 140 mg/dL      Peak postprandial:   less than 180 mg/dL (1-2 hours)      Critically ill patients:  140 - 180 mg/dL    Latest Reference Range & Units 02/15/23 07:18 02/15/23 10:20 02/15/23 12:10 02/15/23 16:13 02/15/23 21:55 02/16/23 02:07 02/16/23 07:55 02/16/23 08:04  Glucose-Capillary 70 - 99 mg/dL 284 (H)  Novolog 19 units      Semglee 30 units 399 (H)  Novolog 27 units  Solumedrol 80 mg @13 :48 474 (H)  Novolog 27 units 337 (H)  Novolog 4 units      Solumedrol 80 mg 461 (H) 473 (H)  Novolog 33 units  Prednisone 30 mg   Review of Glycemic Control  Diabetes history: DM2 Outpatient Diabetes medications: None Current orders for Inpatient glycemic control: Semglee 40 units daily, Novolog 0-15 units TID with meals, Novolog 0-5 units at bedtime, Novolog 18 units TID with meals; Prednisone 30 mg daily    Inpatient Diabetes Program Recommendations:     Insulin: Hyperglycemia due to steroids. Noted Solumedrol discontinued and now ordered Prednisone 30 mg daily that was started today. Noted Semglee and meal coverage insulin increased today. Since steroids tapered, will likely need to decrease insulin as Solumedrol wears off.  Outpatient: Patient has Tresiba insulin at home in the refrigerator. At time of discharge, consider ordering Evaristo Bury for basal insulin plus [place] or [place] daily.  At discharge, please provide Rx for FreeStyle Libre3 sensors [phone]) and reader [phone]).  Thanks, Orlando Penner, RN, MSN, CDCES Diabetes Coordinator Inpatient Diabetes Program [phone] (Team Pager from 8am to 5pm)

## 2023-02-16 NOTE — Telephone Encounter (Signed)
62 yo male with steroid responsive pulmonary infiltrates.    Needs hospital follow up visit with Dr. Wynona Neat, Dr. Delton Coombes, or one of the NPs in 3 to 4 weeks.

## 2023-02-17 ENCOUNTER — Telehealth: Payer: Self-pay | Admitting: Orthopedic Surgery

## 2023-02-17 LAB — CULTURE, BLOOD (ROUTINE X 2)
Culture: NO GROWTH
Culture: NO GROWTH
Special Requests: ADEQUATE
Special Requests: ADEQUATE

## 2023-02-17 NOTE — Telephone Encounter (Signed)
Received call from Stacy (PT) with [facility] advised she called  tried to schedule PT eval today. Stacy asked if she can get a verbal order to start HHPT Tuesday 02/22/2023?  The number to contact Kennyth Arnold is [phone]

## 2023-02-18 ENCOUNTER — Encounter: Payer: Self-pay | Admitting: Family

## 2023-02-18 ENCOUNTER — Ambulatory Visit: Payer: Medicare Other | Admitting: Family

## 2023-02-18 DIAGNOSIS — Z89431 Acquired absence of right foot: Secondary | ICD-10-CM

## 2023-02-18 NOTE — Telephone Encounter (Signed)
I called and sw Troy [name] and advised verbal ok to start therapy on Tuesday.

## 2023-02-18 NOTE — Progress Notes (Signed)
Post-Op Visit Note   Patient: Troy [name]           Date of Birth: 08-Apr-1961           MRN: [id number] Visit Date: 02/18/2023 PCP: Pcp, No  Chief Complaint:  Chief Complaint  Patient presents with   Right Foot - Routine Post Op    02/09/2023 right TMA     HPI:  HPI The patient is a 62 year old gentleman seen status post right transmetatarsal amputation August 21.  His wound VAC apparently malfunctioned this was removed today in the office. Ortho Exam Examination right transmetatarsal amputation the incision is approximated with sutures there is gaping the length of it with granulation there is mild surrounding erythema without warmth no cellulitis  Visit Diagnoses: No diagnosis found.  Plan: Begin daily dose of cleansing.  Dry dressings.  Discussed the importance of compression with Ace as well as elevation.  Nonweightbearing on the right.  Follow-Up Instructions: No follow-ups on file.   Imaging: No results found.  Orders:  No orders of the defined types were placed in this encounter.  No orders of the defined types were placed in this encounter.    PMFS History: Patient Active Problem List   Diagnosis Date Noted   Chronic respiratory failure with hypoxia (HCC) 02/12/2023   Pulmonary infiltrates 02/12/2023   Acute on chronic heart failure with preserved ejection fraction (HFpEF) (HCC) 02/12/2023   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 08/22/2021   Diabetic acidosis without coma (HCC)    Hypokalemia    Hypomagnesemia    Hyponatremia    Pneumonia due to infectious organism    Parapneumonic effusion 08/18/2021   Bowel Ileus (HCC) 08/18/2021   ?? 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   Ketoacidosis due to type 2 diabetes  mellitus (HCC) 08/15/2021   COVID-19 virus infection 08/15/2021   S/P AKA (above knee amputation), left (HCC) 02/12/2021   Amputation of right great toe and 2nd Toe 02/12/2021   Sepsis due to left-sided neck cellulitis/MRSA 02/12/2021   Hypotensive episode    Elevated troponin    SIRS (systemic inflammatory response syndrome) (HCC) 02/11/2021   Abscess of bursa of left ankle 01/21/2021   Dehiscence of amputation stump (HCC)    Ischemia of left BKA site (HCC)    Abscess of leg without foot, left    Osteomyelitis of second toe of right foot (HCC) 07/12/2019   Below knee amputation status, left 01/25/2018   Wound dehiscence, surgical    Status post left foot surgery 12/15/2017   GERD (gastroesophageal reflux disease) 09/26/2017   Hyperlipidemia associated with type 2 diabetes mellitus (HCC) 07/08/2017   Snoring 06/07/2016   Chest tube in place 01/22/2016   Abnormal nuclear stress test 01/22/2016   Pain, chronic due to trauma 07/04/2012   Complex regional pain syndrome I of lower limb 07/04/2012   COPD (chronic obstructive pulmonary disease) (HCC) 01/27/2011   Pre-operative cardiovascular examination 01/27/2011   Nonspecific abnormal electrocardiogram (ECG) (EKG) 01/27/2011   Murmur 01/27/2011   DM2 (diabetes mellitus, type 2) (HCC)    HTN (hypertension)    HLD (hyperlipidemia)    Tobacco abuse    Past Medical History:  Diagnosis Date   Acute renal failure (HCC)    in setting of NSAID  use and orthopedic surgery 2010   Anxiety and depression    Chronic diastolic CHF (congestive heart failure) (HCC)    a. Echo 6/17: severe conc LVH, vigorous EF, EF 65-70%, no dynamic obstruction, no RWMA, Gr 1 DD, mild TR  //  b. LHC 8/17: no sig CAD, LVEDP 28   Cirrhosis of liver (HCC)    COPD (chronic obstructive pulmonary disease) (HCC)    Diabetic ulcer of left foot (HCC)    DM2 (diabetes mellitus, type 2) (HCC)    Dysrhythmia    Family history of early CAD    GERD (gastroesophageal reflux  disease)    History of amputation of foot (HCC)    L trans-met // R toe   History of cardiac catheterization    a. LHC 2002: irregs  //  b. LHC in 8/17: no sig CAD, apical DK, hyperdynamic LV, LVEDP 28   History of kidney stones    History of nuclear stress test    a. Nuc 7/17: Overall, intermediate risk nuclear stress test secondary to small size of apical lateral defect and reduced ejection fraction.  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    Kidney calculi    Palpitations    PTSD (post-traumatic stress disorder)    Tobacco abuse     Family History  Problem Relation Age of Onset   Leukemia Mother 58       died   Lung cancer Father 1       died   Heart attack Brother 49   Heart attack Brother 25   Hypertension Brother        X3   Hypertension Sister        X2   Diabetes Sister    Stroke Sister    Diabetes Sister    Other Brother        Set designer accident    Past Surgical History:  Procedure Laterality Date   ABDOMINAL AORTOGRAM W/LOWER EXTREMITY N/A 01/31/2023   Procedure: ABDOMINAL AORTOGRAM W/LOWER EXTREMITY;  Surgeon: Maeola Harman, MD;  Location: [facility] INVASIVE CV LAB;  Service: Cardiovascular;  Laterality: N/A;   AMPUTATION Left 01/03/2018   Procedure: LEFT MIDFOOT AMPUTATION/REVISION MIDAMPUTAION;  Surgeon: Kathryne Hitch, MD;  Location: [facility] OR;  Service: Orthopedics;  Laterality: Left;   AMPUTATION Left 01/25/2018   Procedure: LEFT BELOW KNEE AMPUTATION;  Surgeon: Nadara Mustard, MD;  Location: [facility] OR;  Service: Orthopedics;  Laterality: Left;   AMPUTATION Left 01/21/2021   Procedure: LEFT ABOVE KNEE AMPUTATION;  Surgeon: Nadara Mustard, MD;  Location: [facility] OR;  Service: Orthopedics;  Laterality: Left;   AMPUTATION Right 02/09/2023   Procedure: RIGHT TRANSMETATARSAL AMPUTATION;  Surgeon: Nadara Mustard, MD;  Location: [facility] OR;  Service: Orthopedics;   Laterality: Right;   AMPUTATION TOE Right 07/17/2019   Procedure: AMPUTATION RIGHT FOOT 2ND TOE;  Surgeon: Kathryne Hitch, MD;  Location:  SURGERY CENTER;  Service: Orthopedics;  Laterality: Right;   APPLICATION OF WOUND VAC Left 01/21/2021   Procedure: APPLICATION OF WOUND VAC;  Surgeon: Nadara Mustard, MD;  Location: [facility] OR;  Service: Orthopedics;  Laterality: Left;   BELOW KNEE LEG AMPUTATION Left 01/25/2018   CARDIAC CATHETERIZATION N/A 01/22/2016   Procedure: Left Heart Cath and Coronary Angiography;  Surgeon: Peter M Swaziland, MD;  Location: [facility]  INVASIVE CV LAB;  Service: Cardiovascular;  Laterality: N/A;   FOOT AMPUTATION Bilateral    I & D EXTREMITY Left 12/15/2017   Procedure: IRRIGATION AND DEBRIDEMENT LEFT FOOT ULCER;  Surgeon: Kathryne Hitch, MD;  Location: [facility] ORS;  Service: Orthopedics;  Laterality: Left;   I & D EXTREMITY Left 07/25/2020   Procedure: LEFT BELOW KNEE AMPUTATION ABSCESS EXCISION AND SKIN GRAFT;  Surgeon: Nadara Mustard, MD;  Location: [facility] OR;  Service: Orthopedics;  Laterality: Left;   I & D EXTREMITY Left 08/22/2020   Procedure: DEBRIDEMENT LEFT BELOW KNEE AMPUTATION AND APPLY KERECIS SKIN GRAFT;  Surgeon: Nadara Mustard, MD;  Location: [facility] OR;  Service: Orthopedics;  Laterality: Left;   LITHOTRIPSY     PERIPHERAL VASCULAR INTERVENTION  01/31/2023   Procedure: PERIPHERAL VASCULAR INTERVENTION;  Surgeon: Maeola Harman, MD;  Location: [facility] INVASIVE CV LAB;  Service: Cardiovascular;;   TENDON LENGTHENING Bilateral    calf   TONSILLECTOMY     Social History   Occupational History   Occupation: DISABLED  Tobacco Use   Smoking status: Every Day    Current packs/day: 1.00    Average packs/day: 1 pack/day for 40.0 years (40.0 ttl pk-yrs)    Types: Cigarettes   Smokeless tobacco: Never  Vaping Use   Vaping status: Never Used  Substance and Sexual Activity   Alcohol use: No   Drug use: No   Sexual activity: Not on file

## 2023-02-23 ENCOUNTER — Encounter: Payer: Self-pay | Admitting: Physician Assistant

## 2023-02-23 ENCOUNTER — Ambulatory Visit: Payer: Medicare Other | Attending: Physician Assistant | Admitting: Physician Assistant

## 2023-02-23 VITALS — BP 98/62 | HR 94 | Ht 72.0 in | Wt 176.0 lb

## 2023-02-23 DIAGNOSIS — I739 Peripheral vascular disease, unspecified: Secondary | ICD-10-CM | POA: Diagnosis not present

## 2023-02-23 DIAGNOSIS — E785 Hyperlipidemia, unspecified: Secondary | ICD-10-CM

## 2023-02-23 DIAGNOSIS — E119 Type 2 diabetes mellitus without complications: Secondary | ICD-10-CM | POA: Diagnosis not present

## 2023-02-23 DIAGNOSIS — I10 Essential (primary) hypertension: Secondary | ICD-10-CM | POA: Diagnosis not present

## 2023-02-23 DIAGNOSIS — I4892 Unspecified atrial flutter: Secondary | ICD-10-CM

## 2023-02-23 DIAGNOSIS — I5032 Chronic diastolic (congestive) heart failure: Secondary | ICD-10-CM

## 2023-02-23 MED ORDER — EMPAGLIFLOZIN 10 MG PO TABS: 10.0000 mg | ORAL_TABLET | Freq: Every day | ORAL | 1 refills | Status: DC

## 2023-02-23 MED ORDER — EMPAGLIFLOZIN 10 MG PO TABS: 10.0000 mg | ORAL_TABLET | Freq: Every day | ORAL | 0 refills | Status: AC

## 2023-02-23 NOTE — Progress Notes (Signed)
Cardiology Office Note:  .   Date:  02/25/2023  ID:  Troy [name], DOB Nov 19, 1960, MRN [id number] PCP: Pcp, No  [facility] Providers Cardiologist:  None  Previously Dr. Tenny Craw (last seen 2018) - patient requested to switch to Dr. Clifton James   History of Present Illness: .   Troy [name] is a 62 y.o. male with past medical history of HTN, HLD, DM II, LLE AKA, PAD s/p R SFA stent 01/31/2023 by Dr. Randie Heinz, prior tobacco use and chronic HFpEF.  Patient was recently admitted to the hospital on 02/04/2019 for for evaluation of elevated heart rate at regular nursing checkup.  On arrival, he was tachycardic with heart rate of 160-170s, underlying rhythm was 2-1 atrial flutter.  This was further confirmed after the heart rate was slowed down using adenosine x 2 in the ED.  He was given Cardizem bolus to help slow down the heart rate, however systolic blood pressure dropped down to the 70s.  He self converted on amiodarone therapy.  During the hospitalization, he was noted to have a small left pseudoaneurysm that was likely the result of the recent lower extremity angiography.  He underwent successful manual compression.  Repeat duplex obtained on 8/21 showed resolution of the pseudoaneurysm.  He also underwent right metatarsal amputation by Dr. Lajoyce Corners on 02/09/2023 for osteomyelitis of the right foot.  He was discharged on triple therapy including aspirin, Plavix and Eliquis.  The plan was to discontinue aspirin after 30 days and treated long-term with Plavix and Eliquis combination.  Amiodarone was later stopped due to development of acute respiratory failure.  He was treated with antibiotic for multifocal pneumonia, however cannot completely rule out the possibility of amiodarone induced pneumonitis.  Patient presents today for posthospital follow-up.  He denies any significant shortness of breath.  He has been doing well since he left the hospital.  He denies any palpitation.  His heart rate is quite regular on  physical exam consistent with sinus rhythm.  He is blood pressure however was low.  Initial blood pressure was 98/62, repeat blood pressure by myself was 94/50.  I decided to discontinue his losartan.  He still has not been able to obtain his [name] prescription for some reason.  I will refill his [name].  He is lower extremity angiography was performed on 8/12, ideally he should be able to discontinue aspirin after 9/12, per wife, they are scheduled to see Dr. Randie Heinz on 9/18.  I will send message to Dr. Randie Heinz, I asked him to continue on the aspirin until he sees Dr. Randie Heinz to make sure Dr. Randie Heinz is also okay with discontinuation of aspirin.  He was still be on Plavix and Eliquis afterward.  I plan to see the patient back in 6 weeks and he can see Dr. Tenny Craw in 3 to 16-month.  ROS:   Patient denies any chest pain, significant shortness of breath or lower extremity edema.  Studies Reviewed: .        Cardiac Studies & Procedures   CARDIAC CATHETERIZATION  CARDIAC CATHETERIZATION 01/22/2016  Narrative  There is hyperdynamic overall left ventricular systolic function with apical dyskinesis.  LV end diastolic pressure is severely elevated.  The left ventricular ejection fraction is greater than 65% by visual estimate.  No significant CAD  1. No significant CAD 2. Apical dyskinesis with otherwise hyperdynamic LV contractility. Etiology of apical wall motion abnormality is unclear. 3. Elevated LV EDP of 28 mm Hg.  Plan: will initiate diuretic therapy  for elevated EDP. DC home today with close cardiology follow up.  Findings Coronary Findings Diagnostic  Dominance: Left  Left Main Vessel was injected. Vessel is normal in caliber. Vessel is angiographically normal.  Left Anterior Descending Vessel was injected. Vessel is normal in caliber. The vessel exhibits minimal luminal irregularities. The LAD tapers distally but no significant plaque noted.  Ramus Intermedius Vessel was injected.  Vessel is normal in caliber. Vessel is angiographically normal.  Left Circumflex Vessel was injected. Vessel is normal in caliber. Vessel is angiographically normal.  Right Coronary Artery Vessel was injected. Vessel is small. Vessel is angiographically normal.  Intervention  No interventions have been documented.   STRESS TESTS  MYOCARDIAL PERFUSION IMAGING 01/15/2016  Narrative  Nuclear stress EF: 43%. There is mid inferior wall hypokinesis.  There was no ST segment deviation noted during stress.  Defect 1: There is a small defect of mild severity present in the apical lateral and apex location. This defect is reversible and a small degree of ischemia cannot be excluded.  Overall, intermediate risk nuclear stress test secondary to small size of apical lateral defect and reduced ejection fraction.  Troy Schultz, MD   ECHOCARDIOGRAM  ECHOCARDIOGRAM COMPLETE 02/06/2023  Narrative ECHOCARDIOGRAM REPORT    Patient Name:   Troy [name] [facility] Date of Exam: 02/06/2023 Medical Rec #:  [id number]     Height:       71.0 in Accession #:    [id number]    Weight:       170.0 lb Date of Birth:  [birth year]     BSA:          1.968 m Patient Age:    61 years      BP:           154/110 mmHg Patient Gender: M             HR:           98 bpm. Exam Location:  Inpatient  Procedure: 2D Echo, Cardiac Doppler and Color Doppler  Indications:    Artial Fibrillation I48.91  History:        Patient has prior history of Echocardiogram examinations, most recent 02/12/2021. CHF, COPD; Risk Factors:Diabetes, Dyslipidemia and Hypertension.  Sonographer:    Troy [name] RDCS Referring Phys: [id number] Troy [name]  IMPRESSIONS   1. Left ventricular ejection fraction, by estimation, is 70 to 75%. The left ventricle has hyperdynamic function. The left ventricle has no regional wall motion abnormalities. The left ventricular internal cavity size was mildly dilated. There is mild concentric left  ventricular hypertrophy of the basal-septal segment. Left ventricular diastolic parameters are consistent with Grade II diastolic dysfunction (pseudonormalization). Elevated left ventricular end-diastolic pressure. 2. Right ventricular systolic function is normal. The right ventricular size is normal. 3. Left atrial size was mildly dilated. 4. The mitral valve is normal in structure. Trivial mitral valve regurgitation. No evidence of mitral stenosis. 5. The aortic valve is tricuspid. Aortic valve regurgitation is trivial. Aortic valve sclerosis is present, with no evidence of aortic valve stenosis. 6. The inferior vena cava is normal in size with greater than 50% respiratory variability, suggesting right atrial pressure of 3 mmHg.  FINDINGS Left Ventricle: Left ventricular ejection fraction, by estimation, is 70 to 75%. The left ventricle has hyperdynamic function. The left ventricle has no regional wall motion abnormalities. The left ventricular internal cavity size was mildly dilated. There is mild concentric left ventricular hypertrophy of the basal-septal segment. Left ventricular diastolic parameters  are consistent with Grade II diastolic dysfunction (pseudonormalization). Elevated left ventricular end-diastolic pressure.  Right Ventricle: The right ventricular size is normal. Right ventricular systolic function is normal.  Left Atrium: Left atrial size was mildly dilated.  Right Atrium: Right atrial size was normal in size.  Pericardium: There is no evidence of pericardial effusion.  Mitral Valve: The mitral valve is normal in structure. Mild mitral annular calcification. Trivial mitral valve regurgitation. No evidence of mitral valve stenosis.  Tricuspid Valve: The tricuspid valve is normal in structure. Tricuspid valve regurgitation is trivial. No evidence of tricuspid stenosis.  Aortic Valve: The aortic valve is tricuspid. Aortic valve regurgitation is trivial. Aortic valve sclerosis  is present, with no evidence of aortic valve stenosis.  Pulmonic Valve: The pulmonic valve was normal in structure. Pulmonic valve regurgitation is not visualized. No evidence of pulmonic stenosis.  Aorta: The aortic root is normal in size and structure.  Venous: The inferior vena cava is normal in size with greater than 50% respiratory variability, suggesting right atrial pressure of 3 mmHg.  IAS/Shunts: No atrial level shunt detected by color flow Doppler.   LEFT VENTRICLE PLAX 2D LVIDd:         5.50 cm   Diastology LVIDs:         4.10 cm   LV e' medial:    4.57 cm/s LV PW:         0.90 cm   LV E/e' medial:  26.5 LV IVS:        0.90 cm   LV e' lateral:   5.44 cm/s LVOT diam:     2.40 cm   LV E/e' lateral: 22.2 LV SV:         95 LV SV Index:   48 LVOT Area:     4.52 cm   RIGHT VENTRICLE         IVC TAPSE (M-mode): 2.0 cm  IVC diam: 1.00 cm  LEFT ATRIUM             Index LA diam:        4.50 cm 2.29 cm/m LA Vol (A2C):   46.5 ml 23.63 ml/m LA Vol (A4C):   63.3 ml 32.17 ml/m LA Biplane Vol: 54.8 ml 27.85 ml/m AORTIC VALVE LVOT Vmax:   113.00 cm/s LVOT Vmean:  70.100 cm/s LVOT VTI:    0.210 m  AORTA Ao Root diam: 3.40 cm Ao Asc diam:  3.70 cm  MITRAL VALVE MV Area (PHT): 3.74 cm     SHUNTS MV Decel Time: 203 msec     Systemic VTI:  0.21 m MV E velocity: 121.00 cm/s  Systemic Diam: 2.40 cm MV A velocity: 98.80 cm/s MV E/A ratio:  1.22  Olga Millers MD Electronically signed by Olga Millers MD Signature Date/Time: 02/06/2023/11:49:05 AM    Final             Risk Assessment/Calculations:    CHA2DS2-VASc Score = 4   This indicates a 4.8% annual risk of stroke. The patient's score is based upon: CHF History: 1 HTN History: 1 Diabetes History: 1 Stroke History: 0 Vascular Disease History: 1 Age Score: 0 Gender Score: 0           Physical Exam:   VS:  BP 98/62 (BP Location: Left Arm, Patient Position: Sitting, Cuff Size: Normal)   Pulse 94    Ht 6' (1.829 m)   Wt 176 lb (79.8 kg)   SpO2 98%   BMI 23.87 kg/m  Wt Readings from Last 3 Encounters:  02/23/23 176 lb (79.8 kg)  02/16/23 173 lb 4.8 oz (78.6 kg)  01/31/23 175 lb (79.4 kg)    GEN: Well nourished, well developed in no acute distress NECK: No JVD; No carotid bruits CARDIAC: RRR, no murmurs, rubs, gallops RESPIRATORY:  Clear to auscultation without rales, wheezing or rhonchi  ABDOMEN: Soft, non-tender, non-distended EXTREMITIES:  No edema; No deformity   ASSESSMENT AND PLAN: .    Paroxysmal atrial flutter: Maintaining sinus rhythm based on physical exam.  Continue Eliquis.  Given the need for Eliquis, the plan is to discontinue aspirin after 1 month.  He was briefly on amiodarone during the recent hospitalization, however amiodarone has been discontinued due to concern for amiodarone induced pneumonitis.  PAD: Recently underwent right SFA stent on 01/31/2023 by Dr. Randie Heinz.  He was discharged on aspirin and Plavix.  Given the need for Eliquis, the plan is to continue triple therapy including aspirin, Plavix and Eliquis for 1 month before stopping aspirin.  He has follow-up with Dr. Randie Heinz in the middle of this month, I asked him to continue on the aspirin until he see Dr. Randie Heinz and potentially stop aspirin after that.  Will message Dr. Randie Heinz to make sure this plan is reasonable  Hypertension: Blood pressure borderline low today, will discontinue losartan  DM 2: He is pharmacy has not been able to fill the prescription for [name].  I have written a paper prescription for the patient.  I have also given him some samples  Hyperlipidemia: On Crestor  Chronic diastolic heart failure: Euvolemic on exam.  Will give prescription for [name].       Dispo: Follow-up with me in 6 weeks.  Patient requested to switch cardiologist from Dr. Tenny Craw to Dr. Clifton James, we will send a message to both to make sure they okay with the switch.  Follow-up with MD in 3 to 29-month.  Signed, Azalee Course, PA

## 2023-02-23 NOTE — Progress Notes (Signed)
During visit today with Azalee Course, PA patient requested provider change from Dr. Tenny Craw to Dr. Clifton James for general cardiology follow up.Routed to providers for review.

## 2023-02-23 NOTE — Patient Instructions (Addendum)
Medication Instructions:  STOP LOSARTAN  *If you need a refill on your cardiac medications before your next appointment, please call your pharmacy*   Lab Work: NO LABS If you have labs (blood work) drawn today and your tests are completely normal, you will receive your results only by: MyChart Message (if you have MyChart) OR A paper copy in the mail If you have any lab test that is abnormal or we need to change your treatment, we will call you to review the results.   Testing/Procedures: NO TESTING   Follow-Up: At [facility], you and your health needs are our priority.  As part of our continuing mission to provide you with exceptional heart care, we have created designated Provider Care Teams.  These Care Teams include your primary Cardiologist (physician) and Advanced Practice Providers (APPs -  Physician Assistants and Nurse Practitioners) who all work together to provide you with the care you need, when you need it.  Your next appointment:   6 week(s)  Provider:   Azalee Course, PA-C    Then, Verne Carrow, MD  will plan to see you again in 3-4 month(s).   Other Instructions DISCUSS COMING OFF ASPIRIN WITH DR. [employer]

## 2023-02-24 ENCOUNTER — Ambulatory Visit: Payer: Medicare Other | Admitting: Orthopedic Surgery

## 2023-03-01 ENCOUNTER — Other Ambulatory Visit: Payer: Self-pay | Admitting: *Deleted

## 2023-03-01 DIAGNOSIS — I7025 Atherosclerosis of native arteries of other extremities with ulceration: Secondary | ICD-10-CM

## 2023-03-04 ENCOUNTER — Ambulatory Visit: Payer: Medicare Other | Admitting: Family

## 2023-03-04 ENCOUNTER — Ambulatory Visit (INDEPENDENT_AMBULATORY_CARE_PROVIDER_SITE_OTHER): Payer: Medicare Other | Admitting: Family

## 2023-03-04 ENCOUNTER — Encounter: Payer: Self-pay | Admitting: Family

## 2023-03-04 DIAGNOSIS — L03115 Cellulitis of right lower limb: Secondary | ICD-10-CM

## 2023-03-04 DIAGNOSIS — Z89431 Acquired absence of right foot: Secondary | ICD-10-CM

## 2023-03-04 MED ORDER — AMOXICILLIN-POT CLAVULANATE 500-125 MG PO TABS: 1.0000 | ORAL_TABLET | Freq: Three times a day (TID) | ORAL | 0 refills | Status: DC

## 2023-03-04 NOTE — Progress Notes (Signed)
Post-Op Visit Note   Patient: Troy [name]           Date of Birth: [birth year]           MRN: [id number] Visit Date: 03/04/2023 PCP: Pcp, No  Chief Complaint:  Chief Complaint  Patient presents with   Right Foot - Routine Post Op    HPI:  HPI The patient is a 62 year old gentleman who is seen status post transmetatarsal amputation on the right August 21.  He is about 3 weeks out has been doing daily dose of cleansing as well as dry dressing changes using Ace wrap and elevating.  Unfortunately he continues to have pain this has been worse he complains of worsening redness of his foot with some swelling.  Just this week has been seen by primary care his diabetes management has changed unfortunately due to steroids his blood sugars have been running in the 500s per patient report  No fever no chills  Ortho Exam On examination transmetatarsal amputation this is approximated sutures there is gaping however there is maceration mild erythema and warmth up to the ankle  Visit Diagnoses: No diagnosis found.  Plan: Will place on a course of Augmentin.  Discussed return precautions.  Continue daily dose of cleansing and dry dressings elevating for swelling  Follow-Up Instructions: No follow-ups on file.   Imaging: No results found.  Orders:  No orders of the defined types were placed in this encounter.  Meds ordered this encounter  Medications   amoxicillin-clavulanate (AUGMENTIN) 500-125 MG tablet    Sig: Take 1 tablet by mouth 3 (three) times daily.    Dispense:  30 tablet    Refill:  0     PMFS History: Patient Active Problem List   Diagnosis Date Noted   Chronic respiratory failure with hypoxia (HCC) 02/12/2023   Pulmonary infiltrates 02/12/2023   Acute on chronic heart failure with preserved ejection fraction (HFpEF) (HCC) 02/12/2023   Chronic osteomyelitis of right foot with draining sinus (HCC) 02/07/2023   Atrial flutter with rapid ventricular response (HCC)  02/05/2023   Hypotension 02/05/2023   Atrial fibrillation with RVR (HCC) 02/04/2023   Atrial flutter (HCC) 02/04/2023   Lower limb ischemia: ??? 08/22/2021   Diabetic acidosis without coma (HCC)    Hypokalemia    Hypomagnesemia    Hyponatremia    Pneumonia due to infectious organism    Parapneumonic effusion 08/18/2021   Bowel Ileus (HCC) 08/18/2021   ?? 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   Ketoacidosis due to type 2 diabetes mellitus (HCC) 08/15/2021   COVID-19 virus infection 08/15/2021   S/P AKA (above knee amputation), left (HCC) 02/12/2021   Amputation of right great toe and 2nd Toe 02/12/2021   Sepsis due to left-sided neck cellulitis/MRSA 02/12/2021   Hypotensive episode    Elevated troponin    SIRS (systemic inflammatory response syndrome) (HCC) 02/11/2021   Abscess of bursa of left ankle 01/21/2021   Dehiscence of amputation stump (HCC)    Ischemia of left BKA site (HCC)    Abscess of leg without foot, left    Osteomyelitis of second toe of right foot (HCC) 07/12/2019   Below knee amputation status, left 01/25/2018   Wound dehiscence, surgical    Status post left foot surgery 12/15/2017   GERD (gastroesophageal reflux disease) 09/26/2017   Hyperlipidemia associated with type 2 diabetes mellitus (HCC) 07/08/2017   Snoring 06/07/2016   Chest tube in place 01/22/2016   Abnormal nuclear stress test 01/22/2016  Pain, chronic due to trauma 07/04/2012   Complex regional pain syndrome I of lower limb 07/04/2012   COPD (chronic obstructive pulmonary disease) (HCC) 01/27/2011   Pre-operative cardiovascular examination 01/27/2011   Nonspecific abnormal electrocardiogram (ECG) (EKG) 01/27/2011   Murmur 01/27/2011   DM2 (diabetes mellitus, type 2) (HCC)    HTN (hypertension)    HLD (hyperlipidemia)    Tobacco abuse    Past Medical History:  Diagnosis Date   Acute renal failure (HCC)    in setting of NSAID use and orthopedic surgery 2010   Anxiety and depression     Chronic diastolic CHF (congestive heart failure) (HCC)    a. Echo 6/17: severe conc LVH, vigorous EF, EF 65-70%, no dynamic obstruction, no RWMA, Gr 1 DD, mild TR  //  b. 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 LHC 2002: irregs  //  b. LHC in 8/17: no sig CAD, apical DK, hyperdynamic LV, LVEDP 28   History of kidney stones    History of nuclear stress test    a. Nuc 7/17: Overall, intermediate risk nuclear stress test secondary to small size of apical lateral defect and reduced ejection fraction.  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    Kidney calculi    Palpitations    PTSD (post-traumatic stress disorder)    Tobacco abuse     Family History  Problem Relation Age of Onset   Leukemia Mother 13       died   Lung cancer Father 57       died   Heart attack Brother 19   Heart attack Brother 103   Hypertension Brother        X3   Hypertension Sister        X2   Diabetes Sister    Stroke Sister    Diabetes Sister    Other Brother        Set designer accident    Past Surgical History:  Procedure Laterality Date   ABDOMINAL AORTOGRAM W/LOWER EXTREMITY N/A 01/31/2023   Procedure: ABDOMINAL AORTOGRAM W/LOWER EXTREMITY;  Surgeon: Maeola Harman, MD;  Location: [facility] INVASIVE CV LAB;  Service: Cardiovascular;  Laterality: N/A;   AMPUTATION Left 01/03/2018   Procedure: LEFT MIDFOOT AMPUTATION/REVISION MIDAMPUTAION;  Surgeon: Kathryne Hitch, MD;  Location: [facility] OR;  Service: Orthopedics;  Laterality: Left;   AMPUTATION Left  01/25/2018   Procedure: LEFT BELOW KNEE AMPUTATION;  Surgeon: Nadara Mustard, MD;  Location: [facility] OR;  Service: Orthopedics;  Laterality: Left;   AMPUTATION Left 01/21/2021   Procedure: LEFT ABOVE KNEE AMPUTATION;  Surgeon: Nadara Mustard, MD;  Location: [facility] OR;  Service: Orthopedics;  Laterality: Left;   AMPUTATION Right 02/09/2023   Procedure: RIGHT TRANSMETATARSAL AMPUTATION;  Surgeon: Nadara Mustard, MD;  Location: [facility] OR;  Service: Orthopedics;  Laterality: Right;   AMPUTATION TOE Right 07/17/2019  Procedure: AMPUTATION RIGHT FOOT 2ND TOE;  Surgeon: Kathryne Hitch, MD;  Location: [facility];  Service: Orthopedics;  Laterality: Right;   APPLICATION OF WOUND VAC Left 01/21/2021   Procedure: APPLICATION OF WOUND VAC;  Surgeon: Nadara Mustard, MD;  Location: [facility] OR;  Service: Orthopedics;  Laterality: Left;   BELOW KNEE LEG AMPUTATION Left 01/25/2018   CARDIAC CATHETERIZATION N/A 01/22/2016   Procedure: Left Heart Cath and Coronary Angiography;  Surgeon: Peter M Swaziland, MD;  Location: [facility] INVASIVE CV LAB;  Service: Cardiovascular;  Laterality: N/A;   FOOT AMPUTATION Bilateral    I & D EXTREMITY Left 12/15/2017   Procedure: IRRIGATION AND DEBRIDEMENT LEFT FOOT ULCER;  Surgeon: Kathryne Hitch, MD;  Location: [facility] ORS;  Service: Orthopedics;  Laterality: Left;   I & D EXTREMITY Left 07/25/2020   Procedure: LEFT BELOW KNEE AMPUTATION ABSCESS EXCISION AND SKIN GRAFT;  Surgeon: Nadara Mustard, MD;  Location: [facility] OR;  Service: Orthopedics;  Laterality: Left;   I & D EXTREMITY Left 08/22/2020   Procedure: DEBRIDEMENT LEFT BELOW KNEE AMPUTATION AND APPLY KERECIS SKIN GRAFT;  Surgeon: Nadara Mustard, MD;  Location: [facility] OR;  Service: Orthopedics;  Laterality: Left;   LITHOTRIPSY     PERIPHERAL VASCULAR INTERVENTION  01/31/2023   Procedure: PERIPHERAL VASCULAR INTERVENTION;  Surgeon: Maeola Harman, MD;  Location: [facility] INVASIVE CV LAB;  Service: Cardiovascular;;   TENDON LENGTHENING  Bilateral    calf   TONSILLECTOMY     Social History   Occupational History   Occupation: DISABLED  Tobacco Use   Smoking status: Every Day    Current packs/day: 1.00    Average packs/day: 1 pack/day for 40.0 years (40.0 ttl pk-yrs)    Types: Cigarettes   Smokeless tobacco: Never  Vaping Use   Vaping status: Never Used  Substance and Sexual Activity   Alcohol use: No   Drug use: No   Sexual activity: Not on file

## 2023-03-08 ENCOUNTER — Encounter: Payer: Self-pay | Admitting: Family

## 2023-03-08 ENCOUNTER — Ambulatory Visit (INDEPENDENT_AMBULATORY_CARE_PROVIDER_SITE_OTHER): Payer: Medicare Other | Admitting: Family

## 2023-03-08 DIAGNOSIS — T8781 Dehiscence of amputation stump: Secondary | ICD-10-CM

## 2023-03-08 NOTE — Progress Notes (Signed)
Post-Op Visit Note   Patient: Troy [name]           Date of Birth: August 24, 1960           MRN: [id number] Visit Date: 03/08/2023 PCP: Pcp, No  Chief Complaint:  Chief Complaint  Patient presents with   Right Foot - Routine Post Op    02/09/23 right TMA    HPI:  HPI The patient is a 62 year old gentleman who is seen status post right transmetatarsal amputation he was seen Friday of last week for concern of infection with dehiscence.  He has not improved over the weekend continues to feel malaise poorly overall denies fevers Ortho Exam On examination of the right transmetatarsal amputation sutures are in place this has opened 1 cm in width able to probe to bone with silver nitrate there is no purulence however he does have erythema and warmth over the dorsum of his foot with blistering of the skin there is no ascending cellulitis no foul odor  Visit Diagnoses: No diagnosis found.  Plan: Recommended below-knee amputation.  Patient agreed with the plan.  Will proceed with this.  Continue oral antibiotics until surgery  Follow-Up Instructions: No follow-ups on file.   Imaging: No results found.  Orders:  No orders of the defined types were placed in this encounter.  No orders of the defined types were placed in this encounter.    PMFS History: Patient Active Problem List   Diagnosis Date Noted   Chronic respiratory failure with hypoxia (HCC) 02/12/2023   Pulmonary infiltrates 02/12/2023   Acute on chronic heart failure with preserved ejection fraction (HFpEF) (HCC) 02/12/2023   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 08/22/2021   Diabetic acidosis without coma (HCC)    Hypokalemia    Hypomagnesemia    Hyponatremia    Pneumonia due to  infectious organism    Parapneumonic effusion 08/18/2021   Bowel Ileus (HCC) 08/18/2021   ?? 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   Ketoacidosis due to type 2 diabetes mellitus (HCC) 08/15/2021   COVID-19 virus infection 08/15/2021   S/P AKA (above knee amputation), left (HCC) 02/12/2021   Amputation of right great toe and 2nd Toe 02/12/2021   Sepsis due to left-sided neck cellulitis/MRSA 02/12/2021   Hypotensive episode    Elevated troponin    SIRS (systemic inflammatory response syndrome) (HCC) 02/11/2021   Abscess of bursa of left ankle 01/21/2021   Dehiscence of amputation stump (HCC)    Ischemia of left BKA site (HCC)    Abscess of leg without foot, left    Osteomyelitis of second toe of right foot (HCC) 07/12/2019   Below knee amputation status, left 01/25/2018   Wound dehiscence, surgical    Status post left foot surgery 12/15/2017   GERD (gastroesophageal reflux disease) 09/26/2017   Hyperlipidemia associated with type 2 diabetes mellitus (HCC) 07/08/2017   Snoring 06/07/2016   Chest tube in place 01/22/2016   Abnormal nuclear stress test 01/22/2016   Pain, chronic due to trauma 07/04/2012   Complex regional pain syndrome I of lower limb 07/04/2012   COPD (chronic obstructive pulmonary disease) (HCC) 01/27/2011   Pre-operative cardiovascular examination 01/27/2011   Nonspecific abnormal electrocardiogram (ECG) (EKG) 01/27/2011   Murmur 01/27/2011   DM2 (diabetes mellitus, type 2) (  HCC)    HTN (hypertension)    HLD (hyperlipidemia)    Tobacco abuse    Past Medical History:  Diagnosis Date   Acute renal failure (HCC)    in setting of NSAID use and orthopedic surgery 2010   Anxiety and depression    Chronic diastolic CHF (congestive heart failure) (HCC)    a. Echo 6/17: severe conc LVH, vigorous EF, EF 65-70%, no dynamic obstruction, no RWMA, Gr 1 DD, mild TR  //  b. LHC 8/17: no sig CAD, LVEDP 28   Cirrhosis of liver (HCC)    COPD (chronic obstructive pulmonary  disease) (HCC)    Diabetic ulcer of left foot (HCC)    DM2 (diabetes mellitus, type 2) (HCC)    Dysrhythmia    Family history of early CAD    GERD (gastroesophageal reflux disease)    History of amputation of foot (HCC)    L trans-met // R toe   History of cardiac catheterization    a. LHC 2002: irregs  //  b. LHC in 8/17: no sig CAD, apical DK, hyperdynamic LV, LVEDP 28   History of kidney stones    History of nuclear stress test    a. Nuc 7/17: Overall, intermediate risk nuclear stress test secondary to small size of apical lateral defect and reduced ejection fraction.  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    Kidney calculi    Palpitations    PTSD (post-traumatic stress disorder)    Tobacco abuse     Family History  Problem Relation Age of Onset   Leukemia Mother 25       died   Lung cancer Father 17       died   Heart attack Brother 20   Heart attack Brother 44   Hypertension Brother        X3   Hypertension Sister        X2   Diabetes Sister    Stroke Sister    Diabetes Sister    Other Brother        Set designer accident    Past Surgical History:  Procedure Laterality Date   ABDOMINAL AORTOGRAM W/LOWER EXTREMITY N/A 01/31/2023   Procedure: ABDOMINAL AORTOGRAM W/LOWER EXTREMITY;  Surgeon: Maeola Harman, MD;  Location: [facility] INVASIVE CV LAB;  Service: Cardiovascular;  Laterality: N/A;   AMPUTATION Left 01/03/2018   Procedure: LEFT MIDFOOT AMPUTATION/REVISION MIDAMPUTAION;  Surgeon: Kathryne Hitch, MD;  Location: [facility] OR;  Service: Orthopedics;  Laterality: Left;   AMPUTATION Left 01/25/2018   Procedure: LEFT BELOW KNEE AMPUTATION;  Surgeon: Nadara Mustard, MD;  Location: [facility] OR;  Service: Orthopedics;  Laterality: Left;   AMPUTATION Left 01/21/2021   Procedure: LEFT ABOVE KNEE AMPUTATION;  Surgeon: Nadara Mustard, MD;  Location: [facility] OR;  Service:  Orthopedics;  Laterality: Left;   AMPUTATION Right 02/09/2023   Procedure: RIGHT TRANSMETATARSAL AMPUTATION;  Surgeon: Nadara Mustard, MD;  Location: [facility] OR;  Service: Orthopedics;  Laterality: Right;   AMPUTATION TOE Right 07/17/2019   Procedure: AMPUTATION RIGHT FOOT 2ND TOE;  Surgeon: Kathryne Hitch, MD;  Location: [facility];  Service: Orthopedics;  Laterality: Right;   APPLICATION OF WOUND VAC Left 01/21/2021   Procedure: APPLICATION OF WOUND VAC;  Surgeon: Nadara Mustard, MD;  Location: [facility] OR;  Service: Orthopedics;  Laterality: Left;   BELOW KNEE LEG AMPUTATION Left 01/25/2018   CARDIAC CATHETERIZATION N/A 01/22/2016   Procedure: Left Heart Cath and Coronary Angiography;  Surgeon: Peter M Swaziland, MD;  Location: [facility] INVASIVE CV LAB;  Service: Cardiovascular;  Laterality: N/A;   FOOT AMPUTATION Bilateral    I & D EXTREMITY Left 12/15/2017   Procedure: IRRIGATION AND DEBRIDEMENT LEFT FOOT ULCER;  Surgeon: Kathryne Hitch, MD;  Location: [facility] ORS;  Service: Orthopedics;  Laterality: Left;   I & D EXTREMITY Left 07/25/2020   Procedure: LEFT BELOW KNEE AMPUTATION ABSCESS EXCISION AND SKIN GRAFT;  Surgeon: Nadara Mustard, MD;  Location: [facility] OR;  Service: Orthopedics;  Laterality: Left;   I & D EXTREMITY Left 08/22/2020   Procedure: DEBRIDEMENT LEFT BELOW KNEE AMPUTATION AND APPLY KERECIS SKIN GRAFT;  Surgeon: Nadara Mustard, MD;  Location: [facility] OR;  Service: Orthopedics;  Laterality: Left;   LITHOTRIPSY     PERIPHERAL VASCULAR INTERVENTION  01/31/2023   Procedure: PERIPHERAL VASCULAR INTERVENTION;  Surgeon: Maeola Harman, MD;  Location: [facility] INVASIVE CV LAB;  Service: Cardiovascular;;   TENDON LENGTHENING Bilateral    calf   TONSILLECTOMY     Social History   Occupational History   Occupation: DISABLED  Tobacco Use   Smoking status: Every Day    Current packs/day: 1.00    Average packs/day: 1 pack/day for 40.0 years (40.0 ttl pk-yrs)    Types: Cigarettes    Smokeless tobacco: Never  Vaping Use   Vaping status: Never Used  Substance and Sexual Activity   Alcohol use: No   Drug use: No   Sexual activity: Not on file

## 2023-03-09 ENCOUNTER — Ambulatory Visit (HOSPITAL_COMMUNITY): Admission: RE | Admit: 2023-03-09 | Payer: Medicare Other | Source: Ambulatory Visit

## 2023-03-09 ENCOUNTER — Ambulatory Visit (HOSPITAL_COMMUNITY): Payer: Medicare Other

## 2023-03-10 ENCOUNTER — Encounter (HOSPITAL_COMMUNITY): Payer: Self-pay | Admitting: Orthopedic Surgery

## 2023-03-10 ENCOUNTER — Other Ambulatory Visit: Payer: Self-pay

## 2023-03-10 MED ORDER — TRANEXAMIC ACID 1000 MG/10ML IV SOLN
2000.0000 mg | INTRAVENOUS | Status: DC
  Filled 2023-03-10 (×2): qty 20

## 2023-03-10 NOTE — Anesthesia Preprocedure Evaluation (Addendum)
Anesthesia Evaluation  Patient identified by MRN, date of birth, ID band Patient awake    Reviewed: Allergy & Precautions, NPO status , Patient's Chart, lab work & pertinent test results  History of Anesthesia Complications Negative for: history of anesthetic complications  Airway Mallampati: II  TM Distance: >3 FB Neck ROM: Full    Dental  (+) Dental Advisory Given, Poor Dentition, Missing, Edentulous Upper,    Pulmonary pneumonia, COPD, Current Smoker   breath sounds clear to auscultation       Cardiovascular hypertension, (-) angina + Peripheral Vascular Disease and +CHF  (-) Past MI + dysrhythmias + Valvular Problems/Murmurs  Rhythm:Regular   1. Left ventricular ejection fraction, by estimation, is 70 to 75%. The  left ventricle has hyperdynamic function. The left ventricle has no  regional wall motion abnormalities. The left ventricular internal cavity  size was mildly dilated. There is mild  concentric left ventricular hypertrophy of the basal-septal segment. Left  ventricular diastolic parameters are consistent with Grade II diastolic  dysfunction (pseudonormalization). Elevated left ventricular end-diastolic  pressure.   2. Right ventricular systolic function is normal. The right ventricular  size is normal.   3. Left atrial size was mildly dilated.   4. The mitral valve is normal in structure. Trivial mitral valve  regurgitation. No evidence of mitral stenosis.   5. The aortic valve is tricuspid. Aortic valve regurgitation is trivial.  Aortic valve sclerosis is present, with no evidence of aortic valve  stenosis.   6. The inferior vena cava is normal in size with greater than 50%  respiratory variability, suggesting right atrial pressure of 3 mmHg.     Neuro/Psych  PSYCHIATRIC DISORDERS Anxiety Depression     Neuromuscular disease    GI/Hepatic ,GERD  ,,  Endo/Other  diabetes, Poorly Controlled    Renal/GU Renal  disease     Musculoskeletal   Abdominal   Peds  Hematology  (+) Blood dyscrasia, anemia   Anesthesia Other Findings   Reproductive/Obstetrics                             Anesthesia Physical Anesthesia Plan  ASA: 3  Anesthesia Plan: Regional and General   Post-op Pain Management: Regional block*   Induction: Intravenous  PONV Risk Score and Plan: 0 and Ondansetron, Dexamethasone, Treatment may vary due to age or medical condition and Midazolam  Airway Management Planned: LMA and Oral ETT  Additional Equipment: None  Intra-op Plan:   Post-operative Plan: Extubation in OR  Informed Consent: I have reviewed the patients History and Physical, chart, labs and discussed the procedure including the risks, benefits and alternatives for the proposed anesthesia with the patient or authorized representative who has indicated his/her understanding and acceptance.     Dental advisory given  Plan Discussed with: CRNA and Anesthesiologist  Anesthesia Plan Comments: ( )        Anesthesia Quick Evaluation

## 2023-03-10 NOTE — Progress Notes (Signed)
I received a call from Glenda Chroman, Lorette Ang wife and designated party release.  Mrs Pendelton reports that patient has not complained of shortness of breath or chest pain.  Mrs Knicely Patient denies having any s/s of Covid in her household, also denies any known exposure to Covid and that Sanket Hashagen has not had  any s/s of upper or lower respiratory infection in the past 8 weeks.   Mr. Sheldon reports that  Mr. Southwood will not let her check CBG too often.  I did read in PCP's progress notes that CBG's had been in the 500' when he was on Prednisone and hospitalization in August 2024.  PCP is Sharon Seller, NP with [facility] [address], in [place]. Ms Ihor Austin had patient stop Prednisone, "patient was not having shortness of breath."   Point of care A1C was drawn at appointment it was 9.9. Ms Orebaugh reported that Mr. Wasil had taken [name] this am, I instructed her to not give Mr. Breydan Cova or any oral DM medications in AM, if CBG < 70 patient should take 1/2 dose of Trulicity, if CBG > 220 Mr. Lebovits should take 1/2 dose of Humulog.   I instructed Mrs. Cameron  to check CBG after awaking and every 2 hours until arrival  to the hospital. I Instructed patient if CBG is less than 70 to take 4 Glucose Tablets or 1 tube of Glucose Gel or 1/2 cup of a clear juice. Recheck CBG in 15 minutes if CBG is not over 70 call, pre- op desk at [phone] for further instructions. If scheduled to receive Insulin, do not take Insulin

## 2023-03-10 NOTE — Progress Notes (Signed)
Troy [name] made aware of the pt's surgery time change 0915-1000, arrival 0645, and not to eat or drink after midnight except for sips with meds and the diabetes instructions. [name] confirmed instructions.

## 2023-03-11 ENCOUNTER — Encounter (HOSPITAL_COMMUNITY)
Admission: RE | Disposition: A | Discharge status: Skilled Nursing Facility | Payer: Self-pay | Source: Home / Self Care | Attending: Orthopedic Surgery

## 2023-03-11 ENCOUNTER — Inpatient Hospital Stay (HOSPITAL_COMMUNITY): Payer: Medicare Other | Admitting: Certified Registered Nurse Anesthetist

## 2023-03-11 ENCOUNTER — Other Ambulatory Visit: Payer: Self-pay

## 2023-03-11 ENCOUNTER — Inpatient Hospital Stay (HOSPITAL_COMMUNITY)
Admission: RE | Admit: 2023-03-11 | Discharge: 2023-03-24 | DRG: 463 | Disposition: A | Discharge status: Skilled Nursing Facility | Payer: Medicare Other | Attending: Internal Medicine | Admitting: Internal Medicine

## 2023-03-11 ENCOUNTER — Encounter (HOSPITAL_COMMUNITY): Payer: Self-pay | Admitting: Orthopedic Surgery

## 2023-03-11 DIAGNOSIS — S88111A Complete traumatic amputation at level between knee and ankle, right lower leg, initial encounter: Secondary | ICD-10-CM | POA: Diagnosis present

## 2023-03-11 DIAGNOSIS — E1169 Type 2 diabetes mellitus with other specified complication: Secondary | ICD-10-CM | POA: Diagnosis present

## 2023-03-11 DIAGNOSIS — I9789 Other postprocedural complications and disorders of the circulatory system, not elsewhere classified: Secondary | ICD-10-CM | POA: Diagnosis not present

## 2023-03-11 DIAGNOSIS — M86171 Other acute osteomyelitis, right ankle and foot: Secondary | ICD-10-CM | POA: Diagnosis present

## 2023-03-11 DIAGNOSIS — E1151 Type 2 diabetes mellitus with diabetic peripheral angiopathy without gangrene: Secondary | ICD-10-CM | POA: Diagnosis present

## 2023-03-11 DIAGNOSIS — I110 Hypertensive heart disease with heart failure: Secondary | ICD-10-CM | POA: Diagnosis present

## 2023-03-11 DIAGNOSIS — Z7901 Long term (current) use of anticoagulants: Secondary | ICD-10-CM

## 2023-03-11 DIAGNOSIS — L02415 Cutaneous abscess of right lower limb: Secondary | ICD-10-CM | POA: Diagnosis present

## 2023-03-11 DIAGNOSIS — E785 Hyperlipidemia, unspecified: Secondary | ICD-10-CM | POA: Diagnosis present

## 2023-03-11 DIAGNOSIS — I480 Paroxysmal atrial fibrillation: Secondary | ICD-10-CM | POA: Diagnosis present

## 2023-03-11 DIAGNOSIS — K7460 Unspecified cirrhosis of liver: Secondary | ICD-10-CM | POA: Diagnosis present

## 2023-03-11 DIAGNOSIS — I509 Heart failure, unspecified: Secondary | ICD-10-CM | POA: Diagnosis not present

## 2023-03-11 DIAGNOSIS — Z7984 Long term (current) use of oral hypoglycemic drugs: Secondary | ICD-10-CM

## 2023-03-11 DIAGNOSIS — Y838 Other surgical procedures as the cause of abnormal reaction of the patient, or of later complication, without mention of misadventure at the time of the procedure: Secondary | ICD-10-CM | POA: Diagnosis present

## 2023-03-11 DIAGNOSIS — F4310 Post-traumatic stress disorder, unspecified: Secondary | ICD-10-CM | POA: Diagnosis present

## 2023-03-11 DIAGNOSIS — Z801 Family history of malignant neoplasm of trachea, bronchus and lung: Secondary | ICD-10-CM

## 2023-03-11 DIAGNOSIS — I483 Typical atrial flutter: Secondary | ICD-10-CM | POA: Diagnosis not present

## 2023-03-11 DIAGNOSIS — B9562 Methicillin resistant Staphylococcus aureus infection as the cause of diseases classified elsewhere: Secondary | ICD-10-CM | POA: Diagnosis present

## 2023-03-11 DIAGNOSIS — Z7902 Long term (current) use of antithrombotics/antiplatelets: Secondary | ICD-10-CM

## 2023-03-11 DIAGNOSIS — Z794 Long term (current) use of insulin: Secondary | ICD-10-CM | POA: Diagnosis not present

## 2023-03-11 DIAGNOSIS — Z79899 Other long term (current) drug therapy: Secondary | ICD-10-CM

## 2023-03-11 DIAGNOSIS — Z01818 Encounter for other preprocedural examination: Principal | ICD-10-CM

## 2023-03-11 DIAGNOSIS — J189 Pneumonia, unspecified organism: Secondary | ICD-10-CM | POA: Insufficient documentation

## 2023-03-11 DIAGNOSIS — Z1152 Encounter for screening for COVID-19: Secondary | ICD-10-CM

## 2023-03-11 DIAGNOSIS — T8781 Dehiscence of amputation stump: Secondary | ICD-10-CM

## 2023-03-11 DIAGNOSIS — Z8701 Personal history of pneumonia (recurrent): Secondary | ICD-10-CM

## 2023-03-11 DIAGNOSIS — J9601 Acute respiratory failure with hypoxia: Secondary | ICD-10-CM | POA: Diagnosis not present

## 2023-03-11 DIAGNOSIS — I5032 Chronic diastolic (congestive) heart failure: Secondary | ICD-10-CM | POA: Diagnosis present

## 2023-03-11 DIAGNOSIS — Z885 Allergy status to narcotic agent status: Secondary | ICD-10-CM

## 2023-03-11 DIAGNOSIS — Z7982 Long term (current) use of aspirin: Secondary | ICD-10-CM

## 2023-03-11 DIAGNOSIS — I4892 Unspecified atrial flutter: Secondary | ICD-10-CM | POA: Diagnosis not present

## 2023-03-11 DIAGNOSIS — I739 Peripheral vascular disease, unspecified: Secondary | ICD-10-CM | POA: Diagnosis not present

## 2023-03-11 DIAGNOSIS — Z823 Family history of stroke: Secondary | ICD-10-CM

## 2023-03-11 DIAGNOSIS — Z95820 Peripheral vascular angioplasty status with implants and grafts: Secondary | ICD-10-CM | POA: Diagnosis not present

## 2023-03-11 DIAGNOSIS — K219 Gastro-esophageal reflux disease without esophagitis: Secondary | ICD-10-CM | POA: Diagnosis present

## 2023-03-11 DIAGNOSIS — E1165 Type 2 diabetes mellitus with hyperglycemia: Secondary | ICD-10-CM | POA: Diagnosis present

## 2023-03-11 DIAGNOSIS — F17210 Nicotine dependence, cigarettes, uncomplicated: Secondary | ICD-10-CM

## 2023-03-11 DIAGNOSIS — Z89612 Acquired absence of left leg above knee: Secondary | ICD-10-CM

## 2023-03-11 DIAGNOSIS — Z87892 Personal history of anaphylaxis: Secondary | ICD-10-CM

## 2023-03-11 DIAGNOSIS — R0603 Acute respiratory distress: Secondary | ICD-10-CM | POA: Diagnosis not present

## 2023-03-11 DIAGNOSIS — Z888 Allergy status to other drugs, medicaments and biological substances status: Secondary | ICD-10-CM

## 2023-03-11 DIAGNOSIS — J440 Chronic obstructive pulmonary disease with acute lower respiratory infection: Secondary | ICD-10-CM | POA: Diagnosis not present

## 2023-03-11 DIAGNOSIS — L89151 Pressure ulcer of sacral region, stage 1: Secondary | ICD-10-CM | POA: Diagnosis present

## 2023-03-11 DIAGNOSIS — Z87442 Personal history of urinary calculi: Secondary | ICD-10-CM

## 2023-03-11 DIAGNOSIS — Z833 Family history of diabetes mellitus: Secondary | ICD-10-CM

## 2023-03-11 DIAGNOSIS — Z806 Family history of leukemia: Secondary | ICD-10-CM

## 2023-03-11 DIAGNOSIS — Z8249 Family history of ischemic heart disease and other diseases of the circulatory system: Secondary | ICD-10-CM

## 2023-03-11 DIAGNOSIS — I959 Hypotension, unspecified: Secondary | ICD-10-CM | POA: Diagnosis not present

## 2023-03-11 DIAGNOSIS — E7800 Pure hypercholesterolemia, unspecified: Secondary | ICD-10-CM | POA: Diagnosis not present

## 2023-03-11 HISTORY — DX: Cutaneous abscess of right lower limb: L02.415

## 2023-03-11 HISTORY — PX: AMPUTATION: SHX166

## 2023-03-11 HISTORY — DX: Pneumonia, unspecified organism: J18.9

## 2023-03-11 HISTORY — DX: Other acute osteomyelitis, right ankle and foot: M86.171

## 2023-03-11 LAB — CBC WITH DIFFERENTIAL/PLATELET
Abs Immature Granulocytes: 0.58 10*3/uL — ABNORMAL HIGH (ref 0.00–0.07)
Basophils Absolute: 0.1 10*3/uL (ref 0.0–0.1)
Basophils Relative: 0 %
Eosinophils Absolute: 0.2 10*3/uL (ref 0.0–0.5)
Eosinophils Relative: 1 %
HCT: 30.7 % — ABNORMAL LOW (ref 39.0–52.0)
Hemoglobin: 9.5 g/dL — ABNORMAL LOW (ref 13.0–17.0)
Immature Granulocytes: 2 %
Lymphocytes Relative: 11 %
Lymphs Abs: 3.2 10*3/uL (ref 0.7–4.0)
MCH: 26 pg (ref 26.0–34.0)
MCHC: 30.9 g/dL (ref 30.0–36.0)
MCV: 84.1 fL (ref 80.0–100.0)
Monocytes Absolute: 1.8 10*3/uL — ABNORMAL HIGH (ref 0.1–1.0)
Monocytes Relative: 6 %
Neutro Abs: 22.5 10*3/uL — ABNORMAL HIGH (ref 1.7–7.7)
Neutrophils Relative %: 80 %
Platelets: 527 10*3/uL — ABNORMAL HIGH (ref 150–400)
RBC: 3.65 MIL/uL — ABNORMAL LOW (ref 4.22–5.81)
RDW: 16.7 % — ABNORMAL HIGH (ref 11.5–15.5)
Smear Review: INCREASED
WBC: 28.4 10*3/uL — ABNORMAL HIGH (ref 4.0–10.5)
nRBC: 0 % (ref 0.0–0.2)

## 2023-03-11 LAB — COMPREHENSIVE METABOLIC PANEL
ALT: 22 U/L (ref 0–44)
AST: 26 U/L (ref 15–41)
Albumin: 2.2 g/dL — ABNORMAL LOW (ref 3.5–5.0)
Alkaline Phosphatase: 115 U/L (ref 38–126)
Anion gap: 9 (ref 5–15)
BUN: 9 mg/dL (ref 8–23)
CO2: 23 mmol/L (ref 22–32)
Calcium: 8.2 mg/dL — ABNORMAL LOW (ref 8.9–10.3)
Chloride: 103 mmol/L (ref 98–111)
Creatinine, Ser: 0.64 mg/dL (ref 0.61–1.24)
GFR, Estimated: >60 mL/min (ref >60)
Glucose, Bld: 65 mg/dL — ABNORMAL LOW (ref 70–99)
Potassium: 3.3 mmol/L — ABNORMAL LOW (ref 3.5–5.1)
Sodium: 135 mmol/L (ref 135–145)
Total Bilirubin: 0.3 mg/dL (ref 0.3–1.2)
Total Protein: 7 g/dL (ref 6.5–8.1)

## 2023-03-11 LAB — GLUCOSE, CAPILLARY
Glucose-Capillary: 56 mg/dL — ABNORMAL LOW (ref 70–99)
Glucose-Capillary: 78 mg/dL (ref 70–99)
Glucose-Capillary: 86 mg/dL (ref 70–99)
Glucose-Capillary: 247 mg/dL — ABNORMAL HIGH (ref 70–99)
Glucose-Capillary: 204 mg/dL — ABNORMAL HIGH (ref 70–99)

## 2023-03-11 LAB — PREALBUMIN: Prealbumin: 5 mg/dL — ABNORMAL LOW (ref 18–38)

## 2023-03-11 LAB — SURGICAL PCR SCREEN
MRSA, PCR: POSITIVE — AB
Staphylococcus aureus: POSITIVE — AB

## 2023-03-11 SURGERY — AMPUTATION BELOW KNEE
Anesthesia: Regional | Site: Knee | Laterality: Right

## 2023-03-11 MED ORDER — ONDANSETRON HCL 4 MG/2ML IJ SOLN: 4.0000 mg | Freq: Four times a day (QID) | INTRAMUSCULAR | Status: DC | PRN

## 2023-03-11 MED ORDER — OXYCODONE HCL 5 MG PO TABS: 5.0000 mg | ORAL_TABLET | Freq: Once | ORAL | Status: DC | PRN

## 2023-03-11 MED ORDER — CHLORHEXIDINE GLUCONATE 0.12 % MT SOLN
OROMUCOSAL | Status: AC
  Administered 2023-03-11: 15 mL via OROMUCOSAL
  Filled 2023-03-11: qty 15

## 2023-03-11 MED ORDER — INSULIN DEGLUDEC 100 UNIT/ML ~~LOC~~ SOPN: 40.0000 [IU] | PEN_INJECTOR | Freq: Every day | SUBCUTANEOUS | Status: DC

## 2023-03-11 MED ORDER — OXYCODONE HCL 5 MG PO TABS
10.0000 mg | ORAL_TABLET | ORAL | Status: DC | PRN
  Administered 2023-03-11 – 2023-03-14 (×12): 15 mg via ORAL
  Administered 2023-03-15: 10 mg via ORAL
  Administered 2023-03-16 – 2023-03-23 (×15): 15 mg via ORAL
  Administered 2023-03-23: 10 mg via ORAL
  Administered 2023-03-23 – 2023-03-24 (×3): 15 mg via ORAL
  Filled 2023-03-11 (×20): qty 3
  Filled 2023-03-11: qty 2
  Filled 2023-03-11 (×13): qty 3

## 2023-03-11 MED ORDER — ZINC SULFATE 220 (50 ZN) MG PO CAPS
220.0000 mg | ORAL_CAPSULE | Freq: Every day | ORAL | Status: AC
  Administered 2023-03-11 – 2023-03-24 (×14): 220 mg via ORAL
  Filled 2023-03-11 (×14): qty 1

## 2023-03-11 MED ORDER — VANCOMYCIN HCL 1000 MG IV SOLR
INTRAVENOUS | Status: AC
  Filled 2023-03-11: qty 20

## 2023-03-11 MED ORDER — FENTANYL CITRATE (PF) 250 MCG/5ML IJ SOLN
INTRAMUSCULAR | Status: AC
  Filled 2023-03-11: qty 5

## 2023-03-11 MED ORDER — PHENYLEPHRINE 80 MCG/ML (10ML) SYRINGE FOR IV PUSH (FOR BLOOD PRESSURE SUPPORT)
PREFILLED_SYRINGE | INTRAVENOUS | Status: DC | PRN
  Administered 2023-03-11 (×2): 160 ug via INTRAVENOUS

## 2023-03-11 MED ORDER — ASPIRIN 81 MG PO TBEC
81.0000 mg | DELAYED_RELEASE_TABLET | Freq: Every day | ORAL | Status: DC
  Administered 2023-03-12 – 2023-03-17 (×6): 81 mg via ORAL
  Filled 2023-03-11 (×6): qty 1

## 2023-03-11 MED ORDER — POTASSIUM CHLORIDE CRYS ER 20 MEQ PO TBCR
20.0000 meq | EXTENDED_RELEASE_TABLET | Freq: Every day | ORAL | Status: AC | PRN
  Administered 2023-03-23: 20 meq via ORAL
  Filled 2023-03-11: qty 1

## 2023-03-11 MED ORDER — GUAIFENESIN-DM 100-10 MG/5ML PO SYRP: 15.0000 mL | ORAL_SOLUTION | ORAL | Status: DC | PRN

## 2023-03-11 MED ORDER — ALUM & MAG HYDROXIDE-SIMETH 200-200-20 MG/5ML PO SUSP: 15.0000 mL | ORAL | Status: DC | PRN

## 2023-03-11 MED ORDER — FENTANYL CITRATE (PF) 100 MCG/2ML IJ SOLN: 25.0000 ug | INTRAMUSCULAR | Status: DC | PRN

## 2023-03-11 MED ORDER — PHENYLEPHRINE HCL-NACL 20-0.9 MG/250ML-% IV SOLN
INTRAVENOUS | Status: DC | PRN
  Administered 2023-03-11: 50 ug/min via INTRAVENOUS

## 2023-03-11 MED ORDER — EMPAGLIFLOZIN 10 MG PO TABS
10.0000 mg | ORAL_TABLET | Freq: Every day | ORAL | Status: DC
  Administered 2023-03-12 – 2023-03-24 (×12): 10 mg via ORAL
  Filled 2023-03-11 (×14): qty 1

## 2023-03-11 MED ORDER — LABETALOL HCL 5 MG/ML IV SOLN: 10.0000 mg | INTRAVENOUS | Status: DC | PRN

## 2023-03-11 MED ORDER — ACETAMINOPHEN 325 MG PO TABS
325.0000 mg | ORAL_TABLET | Freq: Four times a day (QID) | ORAL | Status: DC | PRN
  Administered 2023-03-20: 650 mg via ORAL
  Filled 2023-03-11: qty 2

## 2023-03-11 MED ORDER — VANCOMYCIN HCL 1750 MG/350ML IV SOLN
1750.0000 mg | Freq: Once | INTRAVENOUS | Status: AC
  Administered 2023-03-11: 1750 mg via INTRAVENOUS
  Filled 2023-03-11: qty 350

## 2023-03-11 MED ORDER — METOPROLOL TARTRATE 5 MG/5ML IV SOLN
2.0000 mg | INTRAVENOUS | Status: DC | PRN
  Administered 2023-03-16: 2 mg via INTRAVENOUS
  Filled 2023-03-11: qty 5

## 2023-03-11 MED ORDER — JUVEN PO PACK
1.0000 | PACK | Freq: Two times a day (BID) | ORAL | Status: DC
  Administered 2023-03-11 – 2023-03-24 (×8): 1 via ORAL
  Filled 2023-03-11 (×15): qty 1

## 2023-03-11 MED ORDER — VANCOMYCIN HCL 1250 MG/250ML IV SOLN
1250.0000 mg | Freq: Two times a day (BID) | INTRAVENOUS | Status: DC
  Administered 2023-03-12 – 2023-03-16 (×10): 1250 mg via INTRAVENOUS
  Filled 2023-03-11 (×10): qty 250

## 2023-03-11 MED ORDER — BISACODYL 5 MG PO TBEC: 5.0000 mg | DELAYED_RELEASE_TABLET | Freq: Every day | ORAL | Status: DC | PRN

## 2023-03-11 MED ORDER — LACTATED RINGERS IV SOLN: INTRAVENOUS | Status: DC

## 2023-03-11 MED ORDER — SODIUM CHLORIDE 0.9 % IV SOLN
2.0000 g | INTRAVENOUS | Status: DC
  Administered 2023-03-11 – 2023-03-13 (×3): 2 g via INTRAVENOUS
  Filled 2023-03-11 (×3): qty 20

## 2023-03-11 MED ORDER — PROPOFOL 10 MG/ML IV BOLUS
INTRAVENOUS | Status: AC
  Filled 2023-03-11: qty 20

## 2023-03-11 MED ORDER — VITAMIN C 500 MG PO TABS
1000.0000 mg | ORAL_TABLET | Freq: Every day | ORAL | Status: DC
  Administered 2023-03-11 – 2023-03-24 (×14): 1000 mg via ORAL
  Filled 2023-03-11 (×14): qty 2

## 2023-03-11 MED ORDER — HYDROMORPHONE HCL 1 MG/ML IJ SOLN
0.5000 mg | INTRAMUSCULAR | Status: DC | PRN
  Administered 2023-03-11 – 2023-03-23 (×26): 1 mg via INTRAVENOUS
  Filled 2023-03-11 (×25): qty 1

## 2023-03-11 MED ORDER — SODIUM CHLORIDE 0.9 % IV SOLN: INTRAVENOUS | Status: DC

## 2023-03-11 MED ORDER — MIDAZOLAM HCL 2 MG/2ML IJ SOLN
INTRAMUSCULAR | Status: AC
  Filled 2023-03-11: qty 2

## 2023-03-11 MED ORDER — FENTANYL CITRATE (PF) 100 MCG/2ML IJ SOLN: 100.0000 ug | Freq: Once | INTRAMUSCULAR | Status: AC

## 2023-03-11 MED ORDER — TRANEXAMIC ACID-NACL 1000-0.7 MG/100ML-% IV SOLN
1000.0000 mg | INTRAVENOUS | Status: AC
  Administered 2023-03-11: 1000 mg via INTRAVENOUS
  Filled 2023-03-11: qty 100

## 2023-03-11 MED ORDER — ONDANSETRON HCL 4 MG/2ML IJ SOLN: 4.0000 mg | Freq: Once | INTRAMUSCULAR | Status: DC | PRN

## 2023-03-11 MED ORDER — OXYCODONE HCL 5 MG PO TABS
5.0000 mg | ORAL_TABLET | ORAL | Status: DC | PRN
  Administered 2023-03-11 – 2023-03-15 (×2): 10 mg via ORAL
  Administered 2023-03-16: 5 mg via ORAL
  Administered 2023-03-17 – 2023-03-20 (×5): 10 mg via ORAL
  Administered 2023-03-20 – 2023-03-21 (×2): 5 mg via ORAL
  Filled 2023-03-11: qty 1
  Filled 2023-03-11 (×3): qty 2
  Filled 2023-03-11: qty 1
  Filled 2023-03-11: qty 2
  Filled 2023-03-11: qty 1
  Filled 2023-03-11 (×2): qty 2

## 2023-03-11 MED ORDER — GLIPIZIDE 10 MG PO TABS
10.0000 mg | ORAL_TABLET | Freq: Two times a day (BID) | ORAL | Status: DC
  Administered 2023-03-11 – 2023-03-21 (×19): 10 mg via ORAL
  Filled 2023-03-11 (×4): qty 1
  Filled 2023-03-11: qty 2
  Filled 2023-03-11: qty 1
  Filled 2023-03-11: qty 2
  Filled 2023-03-11 (×2): qty 1
  Filled 2023-03-11: qty 2
  Filled 2023-03-11: qty 1
  Filled 2023-03-11 (×8): qty 2

## 2023-03-11 MED ORDER — MAGNESIUM CITRATE PO SOLN: 1.0000 | Freq: Once | ORAL | Status: DC | PRN

## 2023-03-11 MED ORDER — BUPIVACAINE LIPOSOME 1.3 % IJ SUSP
INTRAMUSCULAR | Status: DC | PRN
Start: 2023-03-11 — End: 2023-03-11
  Administered 2023-03-11: 10 mL via PERINEURAL

## 2023-03-11 MED ORDER — MEPERIDINE HCL 25 MG/ML IJ SOLN: 6.2500 mg | INTRAMUSCULAR | Status: DC | PRN

## 2023-03-11 MED ORDER — ACETAMINOPHEN 325 MG PO TABS: 325.0000 mg | ORAL_TABLET | ORAL | Status: DC | PRN

## 2023-03-11 MED ORDER — VASHE WOUND IRRIGATION OPTIME
TOPICAL | Status: DC | PRN
Start: 2023-03-11 — End: 2023-03-11
  Administered 2023-03-11: 34 [oz_av]

## 2023-03-11 MED ORDER — POLYETHYLENE GLYCOL 3350 17 G PO PACK: 17.0000 g | PACK | Freq: Every day | ORAL | Status: DC | PRN

## 2023-03-11 MED ORDER — APIXABAN 5 MG PO TABS
5.0000 mg | ORAL_TABLET | Freq: Two times a day (BID) | ORAL | Status: DC
  Administered 2023-03-12 – 2023-03-24 (×25): 5 mg via ORAL
  Filled 2023-03-11 (×25): qty 1

## 2023-03-11 MED ORDER — DOCUSATE SODIUM 100 MG PO CAPS
100.0000 mg | ORAL_CAPSULE | Freq: Every day | ORAL | Status: DC
  Administered 2023-03-12 – 2023-03-24 (×10): 100 mg via ORAL
  Filled 2023-03-11 (×12): qty 1

## 2023-03-11 MED ORDER — 0.9 % SODIUM CHLORIDE (POUR BTL) OPTIME
TOPICAL | Status: DC | PRN
Start: 2023-03-11 — End: 2023-03-11
  Administered 2023-03-11: 1000 mL

## 2023-03-11 MED ORDER — MAGNESIUM SULFATE 2 GM/50ML IV SOLN: 2.0000 g | Freq: Every day | INTRAVENOUS | Status: DC | PRN

## 2023-03-11 MED ORDER — BUPIVACAINE HCL (PF) 0.5 % IJ SOLN
INTRAMUSCULAR | Status: DC | PRN
  Administered 2023-03-11: 10 mL via PERINEURAL

## 2023-03-11 MED ORDER — OXYCODONE HCL 5 MG PO TABS
ORAL_TABLET | ORAL | Status: AC
  Filled 2023-03-11: qty 2

## 2023-03-11 MED ORDER — VANCOMYCIN HCL 1 G IV SOLR
INTRAVENOUS | Status: DC | PRN
Start: 2023-03-11 — End: 2023-03-11
  Administered 2023-03-11: 1000 mg

## 2023-03-11 MED ORDER — ACETAMINOPHEN 160 MG/5ML PO SOLN: 325.0000 mg | ORAL | Status: DC | PRN

## 2023-03-11 MED ORDER — CLOPIDOGREL BISULFATE 75 MG PO TABS
75.0000 mg | ORAL_TABLET | Freq: Every day | ORAL | Status: AC
  Administered 2023-03-11 – 2023-03-13 (×3): 75 mg via ORAL
  Filled 2023-03-11 (×3): qty 1

## 2023-03-11 MED ORDER — INSULIN GLARGINE-YFGN 100 UNIT/ML ~~LOC~~ SOLN
40.0000 [IU] | Freq: Every day | SUBCUTANEOUS | Status: DC
  Administered 2023-03-12 – 2023-03-16 (×5): 40 [IU] via SUBCUTANEOUS
  Filled 2023-03-11 (×6): qty 0.4

## 2023-03-11 MED ORDER — MIDAZOLAM HCL 2 MG/2ML IJ SOLN: 2.0000 mg | Freq: Once | INTRAMUSCULAR | Status: AC

## 2023-03-11 MED ORDER — PHENOL 1.4 % MT LIQD: 1.0000 | OROMUCOSAL | Status: DC | PRN

## 2023-03-11 MED ORDER — METRONIDAZOLE 500 MG PO TABS
500.0000 mg | ORAL_TABLET | Freq: Two times a day (BID) | ORAL | Status: DC
  Administered 2023-03-11 – 2023-03-15 (×9): 500 mg via ORAL
  Filled 2023-03-11 (×9): qty 1

## 2023-03-11 MED ORDER — PHENYLEPHRINE 80 MCG/ML (10ML) SYRINGE FOR IV PUSH (FOR BLOOD PRESSURE SUPPORT)
PREFILLED_SYRINGE | INTRAVENOUS | Status: AC
  Filled 2023-03-11: qty 10

## 2023-03-11 MED ORDER — OXYCODONE HCL 5 MG/5ML PO SOLN: 5.0000 mg | Freq: Once | ORAL | Status: DC | PRN

## 2023-03-11 MED ORDER — PANTOPRAZOLE SODIUM 40 MG PO TBEC
40.0000 mg | DELAYED_RELEASE_TABLET | Freq: Every day | ORAL | Status: DC
  Administered 2023-03-11 – 2023-03-24 (×14): 40 mg via ORAL
  Filled 2023-03-11 (×14): qty 1

## 2023-03-11 MED ORDER — CEFAZOLIN SODIUM-DEXTROSE 2-4 GM/100ML-% IV SOLN
2.0000 g | INTRAVENOUS | Status: AC
  Administered 2023-03-11: 2 g via INTRAVENOUS
  Filled 2023-03-11: qty 100

## 2023-03-11 MED ORDER — PROPOFOL 10 MG/ML IV BOLUS
INTRAVENOUS | Status: DC | PRN
  Administered 2023-03-11: 150 mg via INTRAVENOUS

## 2023-03-11 MED ORDER — HYDRALAZINE HCL 20 MG/ML IJ SOLN: 5.0000 mg | INTRAMUSCULAR | Status: DC | PRN

## 2023-03-11 MED ORDER — CHLORHEXIDINE GLUCONATE 0.12 % MT SOLN: 15.0000 mL | Freq: Once | OROMUCOSAL | Status: AC

## 2023-03-11 MED ORDER — ROSUVASTATIN CALCIUM 20 MG PO TABS
20.0000 mg | ORAL_TABLET | Freq: Every day | ORAL | Status: DC
  Administered 2023-03-11 – 2023-03-21 (×11): 20 mg via ORAL
  Filled 2023-03-11 (×11): qty 1

## 2023-03-11 MED ORDER — ORAL CARE MOUTH RINSE: 15.0000 mL | Freq: Once | OROMUCOSAL | Status: AC

## 2023-03-11 MED ORDER — INSULIN ASPART 100 UNIT/ML IJ SOLN
4.0000 [IU] | Freq: Three times a day (TID) | INTRAMUSCULAR | Status: DC
  Administered 2023-03-11 – 2023-03-24 (×29): 4 [IU] via SUBCUTANEOUS

## 2023-03-11 MED ORDER — FENTANYL CITRATE (PF) 100 MCG/2ML IJ SOLN
INTRAMUSCULAR | Status: AC
  Administered 2023-03-11: 100 ug via INTRAVENOUS
  Filled 2023-03-11: qty 2

## 2023-03-11 MED ORDER — PANTOPRAZOLE SODIUM 40 MG PO TBEC: 40.0000 mg | DELAYED_RELEASE_TABLET | Freq: Every day | ORAL | Status: DC

## 2023-03-11 MED ORDER — ONDANSETRON HCL 4 MG/2ML IJ SOLN
INTRAMUSCULAR | Status: DC | PRN
  Administered 2023-03-11: 4 mg via INTRAVENOUS

## 2023-03-11 MED ORDER — INSULIN ASPART 100 UNIT/ML IJ SOLN
0.0000 [IU] | Freq: Three times a day (TID) | INTRAMUSCULAR | Status: DC
  Administered 2023-03-11: 5 [IU] via SUBCUTANEOUS
  Administered 2023-03-12: 2 [IU] via SUBCUTANEOUS
  Administered 2023-03-12: 3 [IU] via SUBCUTANEOUS
  Administered 2023-03-12 – 2023-03-13 (×2): 2 [IU] via SUBCUTANEOUS
  Administered 2023-03-14: 3 [IU] via SUBCUTANEOUS
  Administered 2023-03-14: 2 [IU] via SUBCUTANEOUS
  Administered 2023-03-15: 3 [IU] via SUBCUTANEOUS
  Administered 2023-03-15 – 2023-03-16 (×3): 2 [IU] via SUBCUTANEOUS
  Administered 2023-03-16 – 2023-03-17 (×2): 3 [IU] via SUBCUTANEOUS
  Administered 2023-03-18: 2 [IU] via SUBCUTANEOUS
  Administered 2023-03-18: 3 [IU] via SUBCUTANEOUS
  Administered 2023-03-19: 2 [IU] via SUBCUTANEOUS
  Administered 2023-03-19: 3 [IU] via SUBCUTANEOUS
  Administered 2023-03-20 – 2023-03-22 (×4): 2 [IU] via SUBCUTANEOUS
  Administered 2023-03-23: 3 [IU] via SUBCUTANEOUS
  Administered 2023-03-24: 2 [IU] via SUBCUTANEOUS

## 2023-03-11 MED ORDER — MIDAZOLAM HCL 2 MG/2ML IJ SOLN
INTRAMUSCULAR | Status: AC
  Administered 2023-03-11: 2 mg via INTRAVENOUS
  Filled 2023-03-11: qty 2

## 2023-03-11 SURGICAL SUPPLY — 46 items
BAG COUNTER SPONGE SURGICOUNT (BAG) IMPLANT
BAG SPNG CNTER NS LX DISP (BAG) ×1
BLADE SAW RECIP 87.9 MT (BLADE) ×1 IMPLANT
BLADE SURG 21 STRL SS (BLADE) ×1 IMPLANT
BNDG CMPR 5X6 CHSV STRCH STRL (GAUZE/BANDAGES/DRESSINGS)
BNDG COHESIVE 6X5 TAN ST LF (GAUZE/BANDAGES/DRESSINGS) IMPLANT
CANISTER WOUND CARE 500ML ATS (WOUND CARE) ×1 IMPLANT
CLEANSER WND VASHE INSTL 34OZ (WOUND CARE) IMPLANT
CNTNR URN SCR LID CUP LEK RST (MISCELLANEOUS) IMPLANT
CONT SPEC 4OZ STRL OR WHT (MISCELLANEOUS) ×1
COVER SURGICAL LIGHT HANDLE (MISCELLANEOUS) ×1 IMPLANT
CUFF TOURN SGL QUICK 34 (TOURNIQUET CUFF) ×1
CUFF TRNQT CYL 34X4.125X (TOURNIQUET CUFF) ×1 IMPLANT
DRAPE DERMATAC (DRAPES) IMPLANT
DRAPE INCISE IOBAN 66X45 STRL (DRAPES) ×1 IMPLANT
DRAPE U-SHAPE 47X51 STRL (DRAPES) ×1 IMPLANT
DRESSING PREVENA PLUS CUSTOM (GAUZE/BANDAGES/DRESSINGS) ×1 IMPLANT
DRSG PREVENA PLUS CUSTOM (GAUZE/BANDAGES/DRESSINGS) ×1 IMPLANT
DURAPREP 26ML APPLICATOR (WOUND CARE) ×1 IMPLANT
ELECT REM PT RETURN 9FT ADLT (ELECTROSURGICAL) ×1 IMPLANT
ELECTRODE REM PT RTRN 9FT ADLT (ELECTROSURGICAL) ×1 IMPLANT
FACESHIELD WRAPAROUND (MASK) ×1 IMPLANT
FACESHIELD WRAPAROUND OR TEAM (MASK) IMPLANT
GLOVE BIOGEL PI IND STRL 9 (GLOVE) ×1 IMPLANT
GLOVE SURG ORTHO 9.0 STRL STRW (GLOVE) ×1 IMPLANT
GOWN STRL REUS W/ TWL XL LVL3 (GOWN DISPOSABLE) ×2 IMPLANT
GOWN STRL REUS W/TWL XL LVL3 (GOWN DISPOSABLE) ×2
GRAFT SKIN WND MICRO 38 (Tissue) IMPLANT
KIT BASIN OR (CUSTOM PROCEDURE TRAY) ×1 IMPLANT
KIT TURNOVER KIT B (KITS) ×1 IMPLANT
MANIFOLD NEPTUNE II (INSTRUMENTS) ×1 IMPLANT
NS IRRIG 1000ML POUR BTL (IV SOLUTION) ×1 IMPLANT
PACK ORTHO EXTREMITY (CUSTOM PROCEDURE TRAY) ×1 IMPLANT
PAD ARMBOARD 7.5X6 YLW CONV (MISCELLANEOUS) ×1 IMPLANT
PREVENA RESTOR ARTHOFORM 46X30 (CANNISTER) ×1 IMPLANT
PREVENA RESTOR AXIOFORM 29X28 (GAUZE/BANDAGES/DRESSINGS) IMPLANT
SPONGE T-LAP 18X18 ~~LOC~~+RFID (SPONGE) IMPLANT
STAPLER VISISTAT 35W (STAPLE) IMPLANT
STOCKINETTE IMPERVIOUS LG (DRAPES) ×1 IMPLANT
SUT ETHILON 2 0 PSLX (SUTURE) IMPLANT
SUT SILK 2 0 (SUTURE) ×1
SUT SILK 2-0 18XBRD TIE 12 (SUTURE) ×1 IMPLANT
SUT VIC AB 1 CTX 27 (SUTURE) ×2 IMPLANT
TOWEL GREEN STERILE (TOWEL DISPOSABLE) ×1 IMPLANT
TUBE CONNECTING 12X1/4 (SUCTIONS) ×1 IMPLANT
YANKAUER SUCT BULB TIP NO VENT (SUCTIONS) ×1 IMPLANT

## 2023-03-11 NOTE — Op Note (Signed)
03/11/2023  10:11 AM  PATIENT:  Troy [name]    PRE-OPERATIVE DIAGNOSIS:  Dehiscence Right Transmetatarsal Amputation  POST-OPERATIVE DIAGNOSIS:  Same  PROCEDURE:  RIGHT BELOW KNEE AMPUTATION Application of Kerecis micro graft 38 cm  Application vancomycin powder 1 g. Application of Prevena customizable and Prevena arthroform wound VAC dressings Application of Vive Wear stump shrinker and the Hanger limb protector  Abscess and tissue sent for cultures.  SURGEON:  Nadara Mustard, MD  ANESTHESIA:   General  PREOPERATIVE INDICATIONS:  Troy [name] is a  62 y.o. male with a diagnosis of Dehiscence Right Transmetatarsal Amputation who failed conservative measures and elected for surgical management.    The risks benefits and alternatives were discussed with the patient preoperatively including but not limited to the risks of infection, bleeding, nerve injury, cardiopulmonary complications, the need for revision surgery, among others, and the patient was willing to proceed.  OPERATIVE IMPLANTS:   Implant Name Type Inv. Item Serial No. Manufacturer Lot No. LRB No. Used Action  GRAFT SKIN WND MICRO 38 - [id number] Tissue GRAFT SKIN WND MICRO 38  [employer] [id number] Right 1 Implanted     OPERATIVE FINDINGS: Patient had an abscess that extended up through the anterior compartment.  Patient underwent extensive debridement of the anterior compartment and placement of vancomycin powder.  Tissue and abscess was sent for cultures.  OPERATIVE PROCEDURE: Patient was brought to the operating room after undergoing a regional anesthetic.  After adequate levels anesthesia were obtained a thigh tourniquet was placed and the lower extremity was prepped using DuraPrep draped into a sterile field. The foot was draped out of the sterile field with impervious stockinette.  A timeout was called and the tourniquet inflated.  A transverse skin incision was made 12 cm distal to the tibial tubercle, the  incision curved proximally, and a large posterior flap was created.  The tibia was transected just proximal to the skin incision and beveled anteriorly.  The fibula was transected just proximal to the tibial incision.  The sciatic nerve was pulled cut and allowed to retract.  The vascular bundles were suture ligated with 2-0 silk.  The tourniquet was deflated and hemostasis obtained.   .    The Kerecis micro powder 38 cm was applied to the open wound that has a 200 cm surface area.  This was mixed with 1 g vancomycin powder. The deep and superficial fascial layers were closed using #1 Vicryl.  The skin was closed using staples.    The Prevena customizable dressing was applied this was overwrapped with the arthroform sponge.  Troy [name] was used to secure the sponges and the circumferential compression was secured to the skin with Dermatac.  This was connected to the wound VAC pump and had a good suction fit this was covered with a stump shrinker and a limb protector.  Patient was taken to the PACU in stable condition.   DISCHARGE PLANNING:  Antibiotic duration: 24-hour antibiotics  Weightbearing: Nonweightbearing on the operative extremity  Pain medication: Opioid pathway  Dressing care/ Wound VAC: Continue wound VAC with the Prevena plus pump at discharge for 1 week  Ambulatory devices: Walker or kneeling scooter  Discharge to: Discharge planning based on recommendations per physical therapy  Follow-up: In the office 1 week after discharge.

## 2023-03-11 NOTE — Anesthesia Procedure Notes (Signed)
Anesthesia Regional Block: Popliteal block   Pre-Anesthetic Checklist: , timeout performed,  Correct Patient, Correct Site, Correct Laterality,  Correct Procedure, Correct Position, site marked,  Risks and benefits discussed,  Surgical consent,  Pre-op evaluation,  At surgeon's request and post-op pain management  Laterality: Right  Prep: chloraprep       Needles:  Injection technique: Single-shot  Needle Type: Echogenic Stimulator Needle     Needle Length: 5cm  Needle Gauge: 22     Additional Needles:   Procedures:, nerve stimulator,,, ultrasound used (permanent image in chart),,     Nerve Stimulator or Paresthesia:  Response: foot, 0.45 mA  Additional Responses:   Narrative:  Start time: 03/11/2023 7:55 AM End time: 03/11/2023 8:00 AM Injection made incrementally with aspirations every 5 mL.  Performed by: Personally  Anesthesiologist: Bethena Midget, MD  Additional Notes: Functioning IV was confirmed and monitors were applied.  A 50mm 22ga Arrow echogenic stimulator needle was used. Sterile prep and drape,hand hygiene and sterile gloves were used. Ultrasound guidance: relevant anatomy identified, needle position confirmed, local anesthetic spread visualized around nerve(s)., vascular puncture avoided.  Image printed for medical record. Negative aspiration and negative test dose prior to incremental administration of local anesthetic. The patient tolerated the procedure well.

## 2023-03-11 NOTE — Progress Notes (Signed)
Nasal PCR positive for MRSA and MSSA. Dr. Lajoyce Corners notified. No new orders at this time. Pt has been treated with nasal betadine in pre-op and completed 6 CHG cloths to his body upon arrival to pre-op.

## 2023-03-11 NOTE — Anesthesia Procedure Notes (Signed)
Procedure Name: LMA Insertion Date/Time: 03/11/2023 9:19 AM  Performed by: Cy Blamer, CRNAPre-anesthesia Checklist: Patient identified, Emergency Drugs available, Suction available, Patient being monitored and Timeout performed Patient Re-evaluated:Patient Re-evaluated prior to induction Oxygen Delivery Method: Circle system utilized Preoxygenation: Pre-oxygenation with 100% oxygen Induction Type: IV induction LMA: LMA inserted LMA Size: 5.0 Number of attempts: 1 Placement Confirmation: positive ETCO2 and breath sounds checked- equal and bilateral Tube secured with: Tape Dental Injury: Teeth and Oropharynx as per pre-operative assessment

## 2023-03-11 NOTE — Transfer of Care (Signed)
Immediate Anesthesia Transfer of Care Note  Patient: Troy [name]  Procedure(s) Performed: RIGHT BELOW KNEE AMPUTATION (Right: Knee)  Patient Location: PACU  Anesthesia Type:General  Level of Consciousness: awake, alert , and confused  Airway & Oxygen Therapy: Patient Spontanous Breathing and Patient connected to nasal cannula oxygen  Post-op Assessment: Report given to RN, Post -op Vital signs reviewed and stable, Patient moving all extremities X 4, and Patient able to stick tongue midline  Post vital signs: Reviewed and stable  Last Vitals:  Vitals Value Taken Time  BP 105/64 03/11/23 1015  Temp 98.7   Pulse 79 03/11/23 1016  Resp 16 03/11/23 1016  SpO2 100 % 03/11/23 1016  Vitals shown include unfiled device data.  Last Pain:  Vitals:   03/11/23 0800  TempSrc:   PainSc: 0-No pain         Complications: No notable events documented.

## 2023-03-11 NOTE — Anesthesia Procedure Notes (Signed)
Anesthesia Regional Block: Adductor canal block   Pre-Anesthetic Checklist: , timeout performed,  Correct Patient, Correct Site, Correct Laterality,  Correct Procedure, Correct Position, site marked,  Risks and benefits discussed,  Surgical consent,  Pre-op evaluation,  At surgeon's request and post-op pain management  Laterality: Right  Prep: chloraprep       Needles:  Injection technique: Single-shot  Needle Type: Echogenic Stimulator Needle     Needle Length: 5cm  Needle Gauge: 22     Additional Needles:   Procedures:,,,, ultrasound used (permanent image in chart),,    Narrative:  Start time: 03/11/2023 8:00 AM End time: 03/11/2023 8:05 AM Injection made incrementally with aspirations every 5 mL.  Performed by: Personally  Anesthesiologist: Bethena Midget, MD  Additional Notes: Functioning IV was confirmed and monitors were applied.  A 50mm 22ga Arrow echogenic stimulator needle was used. Sterile prep and drape,hand hygiene and sterile gloves were used. Ultrasound guidance: relevant anatomy identified, needle position confirmed, local anesthetic spread visualized around nerve(s)., vascular puncture avoided.  Image printed for medical record. Negative aspiration and negative test dose prior to incremental administration of local anesthetic. The patient tolerated the procedure well.

## 2023-03-11 NOTE — Progress Notes (Signed)
PHARMACY ANTIBIOTIC CONSULT NOTE   Troy [name] a 62 y.o. 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 MRSA PCR detected.  Pharmacy has been consulted for Vancomycin dosing. Patient received vancomycin powder in OR, but pre-op abx used was cefazolin.   9/20: Scr 0.64, WBC 28.4  Vital Signs stable   Estimated Creatinine Clearance: 100.7 mL/min (by C-G formula based on SCr of 0.64 mg/dL).  Plan: CONTINUE ceftriaxone 2 g IV Q24H  Continue Flagyl 500 mg IV Q12H  GIVE Vancomycin 1,750 mg IV x1 (Wt used: 79.4 kg)  THEN Vancomycin 1,250 mg IV Q12H (Scr used: 0.8, Vd used: 0.72, eAUC: 497) Monitor renal function, clinical status, de-escalation, C/S, levels as indicated   Allergies:  Allergies  Allergen Reactions   Hydromorphone Hcl Er Anaphylaxis and Other (See Comments)    Allergic to DYE in extended-release tablet, can tolerate other forms of hydromorphone   Tapentadol Anaphylaxis, Swelling and Other (See Comments)    THROAT ANGIOEDEMA Nucynta [Tapentadol Hydrochloride]   Exalamide Other (See Comments)    UNSPECIFIED REACTION     Filed Weights   03/11/23 0709  Weight: 79.4 kg (175 lb)       Latest Ref Rng & Units 03/11/2023    7:15 AM 02/14/2023   11:26 AM 02/13/2023    3:08 AM  CBC  WBC 4.0 - 10.5 K/uL 28.4  17.7  18.4   Hemoglobin 13.0 - 17.0 g/dL 9.5  16.1  09.6   Hematocrit 39.0 - 52.0 % 30.7  37.1  35.1   Platelets 150 - 400 K/uL 527  447  359     Antibiotics Given (last 72 hours)     Date/Time Action Medication Dose   03/11/23 0921 Given   ceFAZolin (ANCEF) IVPB 2g/100 mL premix 2 g   03/11/23 0954 Given  [Exp 2026]   vancomycin (VANCOCIN) powder 1,000 mg       Vanc 9/20>>c  CRO 9/20>>c Flagyl 9/20>>c   MRSA PCR detected  9/20 OR cx: sent   Thank you for allowing pharmacy to be a part of this patient's care.  Jani Gravel, PharmD Clinical Pharmacist  03/11/2023 11:53 AM

## 2023-03-11 NOTE — Progress Notes (Signed)
Glucose 65 on CMP drawn at 7:15. Pt asymptomatic. Dr. Andres Ege notified. Per MD, his CBG will be re-checked in OR. Pt and wife educated to notify RN if he starts experiencing any symptoms of a low blood sugar. Pt and wife verbalized understanding.

## 2023-03-11 NOTE — H&P (Signed)
Troy [name] is an 62 y.o. male.   Chief Complaint: Dehiscence transmetatarsal amputation. HPI: The patient is a 62 year old gentleman who is seen status post right transmetatarsal amputation he was seen Friday of last week for concern of infection with dehiscence. He has not improved over the weekend continues to feel malaise poorly overall denies fevers   Past Medical History:  Diagnosis Date   Acute renal failure (HCC)    in setting of NSAID use and orthopedic surgery 2010   Anxiety and depression    Chronic diastolic CHF (congestive heart failure) (HCC)    a. Echo 6/17: severe conc LVH, vigorous EF, EF 65-70%, no dynamic obstruction, no RWMA, Gr 1 DD, mild TR  //  b. 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 LHC 2002: irregs  //  b. LHC in 8/17: no sig CAD, apical DK, hyperdynamic LV, LVEDP 28   History of kidney stones    History of nuclear stress test    a. Nuc 7/17: Overall, intermediate risk nuclear stress test secondary to small size of apical lateral defect and reduced ejection fraction.  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    Kidney calculi    Palpitations    Pneumonia    PTSD (post-traumatic stress disorder)    Tobacco abuse     Past Surgical History:  Procedure Laterality Date   ABDOMINAL AORTOGRAM W/LOWER EXTREMITY N/A 01/31/2023   Procedure: ABDOMINAL AORTOGRAM W/LOWER EXTREMITY;  Surgeon: Maeola Harman, MD;  Location: [facility] INVASIVE CV LAB;  Service: Cardiovascular;  Laterality: N/A;   AMPUTATION  Left 01/03/2018   Procedure: LEFT MIDFOOT AMPUTATION/REVISION MIDAMPUTAION;  Surgeon: Kathryne Hitch, MD;  Location: [facility] OR;  Service: Orthopedics;  Laterality: Left;   AMPUTATION Left 01/25/2018   Procedure: LEFT BELOW KNEE AMPUTATION;  Surgeon: Nadara Mustard, MD;  Location: [facility] OR;  Service: Orthopedics;  Laterality: Left;   AMPUTATION Left 01/21/2021   Procedure: LEFT ABOVE KNEE AMPUTATION;  Surgeon: Nadara Mustard, MD;  Location: [facility] OR;  Service: Orthopedics;  Laterality: Left;   AMPUTATION Right 02/09/2023   Procedure: RIGHT TRANSMETATARSAL AMPUTATION;  Surgeon: Nadara Mustard, MD;  Location: [facility] OR;  Service: Orthopedics;  Laterality: Right;   AMPUTATION TOE Right 07/17/2019   Procedure: AMPUTATION RIGHT FOOT 2ND TOE;  Surgeon: Kathryne Hitch, MD;  Location: [facility];  Service: Orthopedics;  Laterality: Right;   APPLICATION OF WOUND VAC Left 01/21/2021   Procedure: APPLICATION OF WOUND VAC;  Surgeon: Nadara Mustard, MD;  Location: [facility] OR;  Service: Orthopedics;  Laterality: Left;   BELOW KNEE LEG AMPUTATION Left 01/25/2018   CARDIAC CATHETERIZATION N/A 01/22/2016   Procedure: Left Heart Cath and Coronary Angiography;  Surgeon: Peter M Swaziland, MD;  Location: [facility] INVASIVE CV LAB;  Service: Cardiovascular;  Laterality: N/A;   FOOT AMPUTATION Bilateral    I &  D EXTREMITY Left 12/15/2017   Procedure: IRRIGATION AND DEBRIDEMENT LEFT FOOT ULCER;  Surgeon: Kathryne Hitch, MD;  Location: [facility] ORS;  Service: Orthopedics;  Laterality: Left;   I & D EXTREMITY Left 07/25/2020   Procedure: LEFT BELOW KNEE AMPUTATION ABSCESS EXCISION AND SKIN GRAFT;  Surgeon: Nadara Mustard, MD;  Location: [facility] OR;  Service: Orthopedics;  Laterality: Left;   I & D EXTREMITY Left 08/22/2020   Procedure: DEBRIDEMENT LEFT BELOW KNEE AMPUTATION AND APPLY KERECIS SKIN GRAFT;  Surgeon: Nadara Mustard, MD;  Location: [facility] OR;  Service: Orthopedics;  Laterality: Left;   LITHOTRIPSY     PERIPHERAL VASCULAR  INTERVENTION  01/31/2023   Procedure: PERIPHERAL VASCULAR INTERVENTION;  Surgeon: Maeola Harman, MD;  Location[facility] INVASIVE CV LAB;  Service: Cardiovascular;;   TENDON LENGTHENING Bilateral    calf   TONSILLECTOMY      Family History  Problem Relation Age of Onset   Leukemia Mother 20       died   Lung cancer Father 32       died   Heart attack Brother 39   Heart attack Brother 33   Hypertension Brother        X3   Hypertension Sister        X2   Diabetes Sister    Stroke Sister    Diabetes Sister    Other Brother        Set designer accident   Social History:  reports that he has been smoking cigarettes. He has a 40 pack-year smoking history. He has never used smokeless tobacco. He reports that he does not drink alcohol and does not use drugs.  Allergies:  Allergies  Allergen Reactions   Hydromorphone Hcl Er Anaphylaxis and Other (See Comments)    Allergic to DYE in extended-release tablet, can tolerate other forms of hydromorphone   Tapentadol Anaphylaxis, Swelling and Other (See Comments)    THROAT ANGIOEDEMA Nucynta [Tapentadol Hydrochloride]   Exalamide Other (See Comments)    UNSPECIFIED REACTION     Medications Prior to Admission  Medication Sig Dispense Refill   amoxicillin-clavulanate (AUGMENTIN) 500-125 MG tablet Take 1 tablet by mouth 3 (three) times daily. 30 tablet 0   apixaban (ELIQUIS) 5 MG TABS tablet Take 1 tablet (5 mg total) by mouth 2 (two) times daily. 180 tablet 0   aspirin EC 81 MG tablet Take 1 tablet (81 mg total) by mouth daily with breakfast. Swallow whole. 30 tablet 0   clopidogrel (PLAVIX) 75 MG tablet Take 1 tablet (75 mg total) by mouth daily. 30 tablet 11   empagliflozin ([name]) 10 MG TABS tablet Take 1 tablet (10 mg total) by mouth daily before breakfast. 14 tablet 0   glipiZIDE (GLUCOTROL) 10 MG tablet Take 10 mg by mouth 2 (two) times daily.     insulin degludec (TRESIBA) 100 UNIT/ML FlexTouch Pen Inject 40 Units into the skin  daily. (Patient taking differently: Inject 40-42 Units into the skin daily.) 12 mL 2   insulin lispro (HUMALOG) 100 UNIT/ML KwikPen Inject 18 Units into the skin 3 (three) times daily. (Patient taking differently: Inject 18-22 Units into the skin 3 (three) times daily.) 16.2 mL 2   oxyCODONE-acetaminophen (PERCOCET) 10-325 MG tablet Take 1 tablet by mouth every 4 (four) hours as needed for pain.     pantoprazole (PROTONIX) 40 MG tablet Take 1 tablet (40 mg total) by mouth daily. 90 tablet 0   rosuvastatin (CRESTOR) 20 MG tablet Take 1 tablet (  20 mg total) by mouth daily. 90 tablet 0   Continuous Glucose Receiver (FREESTYLE LIBRE 3 READER) DEVI Use as directed with lifestyle libre sensors 1 each 0   Continuous Glucose Sensor (FREESTYLE LIBRE 3 SENSOR) MISC Place 1 sensor on the skin every 14 days. Use to check glucose continuously 2 each 3   Insulin Pen Needle (PEN NEEDLES 3/16") 31G X 5 MM MISC Use as directed with insulin pen 100 each 3    No results found for this or any previous visit (from the past 48 hour(s)). No results found.  Review of Systems  All other systems reviewed and are negative.   There were no vitals taken for this visit. Physical Exam  On examination of the right transmetatarsal amputation sutures are in place this has opened 1 cm in width able to probe to bone with silver nitrate there is no purulence however he does have erythema and warmth over the dorsum of his foot with blistering of the skin there is no ascending cellulitis no foul odor  Assessment/Plan Assessment: Dehiscence transmetatarsal amputation.  Plan: With the exposed bone and progressive dehiscence I have recommended proceeding with a transtibial amputation.  Risk and benefits were discussed including nonhealing of the wound and need for additional surgery.  Patient states he understands wished to proceed at this time.  Nadara Mustard, MD 03/11/2023, 6:53 AM

## 2023-03-11 NOTE — Anesthesia Postprocedure Evaluation (Signed)
     Anesthesia Post Note  Patient: Troy [name]  Procedure(s) Performed: RIGHT BELOW KNEE AMPUTATION (Right: Knee)     Anesthesia Type: Regional Anesthetic complications: no  No notable events documented.  Last Vitals:  Vitals:   03/11/23 1100 03/11/23 1130  BP: 113/60 118/65  Pulse: 80 81  Resp: 20 18  Temp: 37.1 C 37 C  SpO2: 98% 98%    Last Pain:  Vitals:   03/11/23 1130  TempSrc: Oral  PainSc:                  Thandiwe Siragusa

## 2023-03-11 NOTE — Progress Notes (Signed)
CBG 56 upon arrival to short stay. Dr Andres Ege notified. Pt states he does not have any symptoms, and his blood sugar is normally much higher than this. No new orders at this time. CMP sent per order.   Per wife, pt CBG was 306 at home this morning. She gave him 18 units Humalog at 4:35 and 40 units Tresiba at 4:00AM. Dr Andres Ege notified.

## 2023-03-12 ENCOUNTER — Encounter (HOSPITAL_COMMUNITY): Payer: Self-pay | Admitting: Orthopedic Surgery

## 2023-03-12 LAB — CBC
HCT: 27 % — ABNORMAL LOW (ref 39.0–52.0)
Hemoglobin: 8.6 g/dL — ABNORMAL LOW (ref 13.0–17.0)
MCH: 27 pg (ref 26.0–34.0)
MCHC: 31.9 g/dL (ref 30.0–36.0)
MCV: 84.9 fL (ref 80.0–100.0)
Platelets: 398 10*3/uL (ref 150–400)
RBC: 3.18 MIL/uL — ABNORMAL LOW (ref 4.22–5.81)
RDW: 17.2 % — ABNORMAL HIGH (ref 11.5–15.5)
WBC: 20.3 10*3/uL — ABNORMAL HIGH (ref 4.0–10.5)
nRBC: 0 % (ref 0.0–0.2)

## 2023-03-12 LAB — GLUCOSE, CAPILLARY
Glucose-Capillary: 140 mg/dL — ABNORMAL HIGH (ref 70–99)
Glucose-Capillary: 146 mg/dL — ABNORMAL HIGH (ref 70–99)
Glucose-Capillary: 177 mg/dL — ABNORMAL HIGH (ref 70–99)
Glucose-Capillary: 237 mg/dL — ABNORMAL HIGH (ref 70–99)

## 2023-03-12 MED ORDER — MUPIROCIN 2 % EX OINT
1.0000 | TOPICAL_OINTMENT | Freq: Two times a day (BID) | CUTANEOUS | Status: AC
  Administered 2023-03-12 – 2023-03-16 (×10): 1 via NASAL
  Filled 2023-03-12 (×2): qty 22

## 2023-03-12 MED ORDER — CHLORHEXIDINE GLUCONATE CLOTH 2 % EX PADS
6.0000 | MEDICATED_PAD | Freq: Every day | CUTANEOUS | Status: AC
  Administered 2023-03-12 – 2023-03-16 (×4): 6 via TOPICAL

## 2023-03-12 NOTE — Evaluation (Signed)
Physical Therapy Evaluation Patient Details Name: Troy [name] MRN: [id number] DOB: November 24, 1960 Today's Date: 03/12/2023  History of Present Illness  Pt is a 62 yo male presenting with dehiscence of R transmet amputation site. Pt is currently R BKA. PMH: CHF, COPD, DM2, HTN, restless leg syndrome, PTSD, RLE trans met amputation, L AKA  Clinical Impression  Pt is presenting below baseline level of functioning. Pt was supervision for bed mobility and deferred OOB mobility today. Pt demonstrates good UE strength to perform lateral/scoot transfers with very minimal physical assistance. Pt states spouse can assist at home. Due to pt current functional status, home set up and available assistance at home recommending skilled physical therapy services 3x/weekly on discharge from acute care hospital setting. Pt was limited by pain at evaluation.         If plan is discharge home, recommend the following: A little help with walking and/or transfers;Assistance with cooking/housework     Equipment Recommendations None recommended by PT     Functional Status Assessment Patient has had a recent decline in their functional status and demonstrates the ability to make significant improvements in function in a reasonable and predictable amount of time.     Precautions / Restrictions Precautions Precautions: Fall Restrictions Weight Bearing Restrictions: No      Mobility  Bed Mobility Overal bed mobility: Needs Assistance Bed Mobility: Supine to Sit, Sit to Supine     Supine to sit: Supervision Sit to supine: Supervision        Transfers     General transfer comment: deferred due to patient refusal         Balance Overall balance assessment: Mild deficits observed, not formally tested       Pertinent Vitals/Pain Pain Assessment Pain Assessment: Faces Faces Pain Scale: Hurts even more Pain Location: R leg Pain Descriptors / Indicators: Guarding, Grimacing, Discomfort Pain  Intervention(s): Limited activity within patient's tolerance, Monitored during session, Repositioned, Patient requesting pain meds-RN notified    Home Living Family/patient expects to be discharged to:: Private residence Living Arrangements: Spouse/significant other (spouse works pt states he is at home sometimes for short periods alone) Available Help at Discharge: Family;Available PRN/intermittently Type of Home: House Home Access: Ramped entrance       Home Layout: One level Home Equipment: Rolling Walker (2 wheels);BSC/3in1;Shower seat;Grab bars - tub/shower;Wheelchair - Pharmacist, hospital Comments: bedside commode over top toilet to allow arm rests to push up on    Prior Function Prior Level of Function : Independent/Modified Independent             Mobility Comments: Using wheelchair and scooter, pt states he was performing lateral transfers at mod I. ADLs Comments: Dresses and bathes himself     Extremity/Trunk Assessment   Upper Extremity Assessment Upper Extremity Assessment: Defer to OT evaluation    Lower Extremity Assessment Lower Extremity Assessment: LLE deficits/detail;RLE deficits/detail RLE Deficits / Details: R BKA recent LLE Deficits / Details: L AKA    Cervical / Trunk Assessment Cervical / Trunk Assessment: Normal  Communication   Communication Communication: No apparent difficulties  Cognition Arousal: Alert Behavior During Therapy: Flat affect Overall Cognitive Status: Within Functional Limits for tasks assessed     General Comments: WFL, patient flat and minimally communicative        General Comments General comments (skin integrity, edema, etc.): VSS on RA        Assessment/Plan    PT Assessment Patient needs continued PT services  PT Problem List  Decreased mobility;Pain       PT Treatment Interventions Therapeutic exercise;Balance training;Functional mobility training;Therapeutic activities;Patient/family  education;Wheelchair mobility training    PT Goals (Current goals can be found in the Care Plan section)  Acute Rehab PT Goals Patient Stated Goal: Return home with spouse PT Goal Formulation: With patient Time For Goal Achievement: 03/26/23 Potential to Achieve Goals: Good    Frequency Min 1X/week        AM-PAC PT "6 Clicks" Mobility  Outcome Measure Help needed turning from your back to your side while in a flat bed without using bedrails?: A Little Help needed moving from lying on your back to sitting on the side of a flat bed without using bedrails?: A Little Help needed moving to and from a bed to a chair (including a wheelchair)?: A Little Help needed standing up from a chair using your arms (e.g., wheelchair or bedside chair)?: Total Help needed to walk in hospital room?: Total Help needed climbing 3-5 steps with a railing? : Total 6 Click Score: 12    End of Session Equipment Utilized During Treatment: Gait belt Activity Tolerance: Patient limited by pain Patient left: in bed;with call bell/phone within reach;with bed alarm set Nurse Communication: Mobility status PT Visit Diagnosis: Other abnormalities of gait and mobility (R26.89)    Time: 1610-9604 PT Time Calculation (min) (ACUTE ONLY): 10 min   Charges:   PT Evaluation $PT Eval Low Complexity: 1 Low   PT General Charges $$ ACUTE PT VISIT: 1 Visit        Harrel Carina, DPT, CLT  Acute Rehabilitation Services Office: [phone] (Secure chat preferred)   Claudia Desanctis 03/12/2023, 2:36 PM

## 2023-03-12 NOTE — Progress Notes (Signed)
Patient ID: Troy [name], male   DOB: 05-Dec-1960, 62 y.o.   MRN: [id number] Patient is postoperative day 1 transtibial amputation on the right.  Operative findings showed purulent abscess that extended up to the amputation site.  The wound was cleansed with Vashe and vancomycin powder 1 g and Kerecis micro graft were applied to the wound bed after cleansing.  Cultures are showing gram-positive cocci.  Will continue the broad-spectrum antibiotics and adjust according to sensitivities.  Discussed the potential for higher level amputation.  Repeat CBC.

## 2023-03-12 NOTE — Plan of Care (Signed)
  Problem: Clinical Measurements: Goal: Postoperative complications will be avoided or minimized Outcome: Progressing   Problem: Self-Care: Goal: Ability to meet self-care needs will improve Outcome: Progressing   Problem: Pain Management: Goal: Pain level will decrease with appropriate interventions Outcome: Progressing   Problem: Activity: Goal: Ability to return to baseline activity level will improve Outcome: Progressing

## 2023-03-12 NOTE — Evaluation (Signed)
Occupational Therapy Evaluation Patient Details Name: Troy [name] MRN: [id number] DOB: 1961-02-17 Today's Date: 03/12/2023   History of Present Illness Pt is a 62 yo male presenting with dehiscence of R transmet amputation site. Pt is currently R BKA. PMH: CHF, COPD, DM2, HTN, restless leg syndrome, PTSD, RLE trans met amputation, L AKA   Clinical Impression   Prior to this admission, patient completing scoot transfers OOB and onto toilet, into shower, and using his electric scooter for mobility purposes. Patient reports independence in his ADL management. Currently, patient presenting with pain in RLE, and minimally participatory with evaluation. Patient demonstrating excellent upper body stregth to complete push ups with BUEs with bed rails, and willing to sit EOB, however refusing further movement. Patient stating that his wife can help as needed, and is motivated to return home. Patient also states he has all DME needed. OT recommending HHOT to promote continued independence at home; will continue to follow acutely.       If plan is discharge home, recommend the following: A little help with walking and/or transfers;A little help with bathing/dressing/bathroom;Assist for transportation;Help with stairs or ramp for entrance;Assistance with cooking/housework    Functional Status Assessment  Patient has had a recent decline in their functional status and demonstrates the ability to make significant improvements in function in a reasonable and predictable amount of time.  Equipment Recommendations  None recommended by OT (patient has DME needed)    Recommendations for Other Services       Precautions / Restrictions Restrictions Weight Bearing Restrictions: No      Mobility Bed Mobility Overal bed mobility: Needs Assistance Bed Mobility: Supine to Sit, Sit to Supine     Supine to sit: Supervision Sit to supine: Supervision        Transfers                   General  transfer comment: deferred due to patient refusal      Balance Overall balance assessment: Mild deficits observed, not formally tested                                         ADL either performed or assessed with clinical judgement   ADL Overall ADL's : Needs assistance/impaired Eating/Feeding: Set up;Sitting   Grooming: Set up;Sitting   Upper Body Bathing: Set up;Sitting   Lower Body Bathing: Minimal assistance;Sitting/lateral leans   Upper Body Dressing : Set up;Sitting   Lower Body Dressing: Minimal assistance;Sit to/from stand;Sitting/lateral leans     Toilet Transfer Details (indicate cue type and reason): deferred, patient did not want to attempt OOB Toileting- Clothing Manipulation and Hygiene: Moderate assistance;Bed level       Functional mobility during ADLs: Minimal assistance General ADL Comments: Patient presenting with pain in RLE, and minimally participatory with evaluation. Patient demonstrating excellent upper body stregth to complete push ups with BUEs with bed rails, and willing to sit EOB, however refusing further movement. Patient stating that his wife can help as needed, and is motivated to return home. Patient also states he has all DME needed. OT recommending HHOT to promote continued independence at home; will continue to follow acutely.     Vision Baseline Vision/History: 1 Wears glasses Ability to See in Adequate Light: 0 Adequate Patient Visual Report: No change from baseline Vision Assessment?: No apparent visual deficits     Perception  Praxis         Pertinent Vitals/Pain Pain Assessment Pain Assessment: Faces Faces Pain Scale: Hurts even more Pain Location: R leg Pain Descriptors / Indicators: Guarding, Grimacing, Discomfort Pain Intervention(s): Limited activity within patient's tolerance, Monitored during session, Repositioned, Patient requesting pain meds-RN notified     Extremity/Trunk Assessment Upper  Extremity Assessment Upper Extremity Assessment: Overall WFL for tasks assessed   Lower Extremity Assessment Lower Extremity Assessment: Defer to PT evaluation   Cervical / Trunk Assessment Cervical / Trunk Assessment: Normal   Communication Communication Communication: No apparent difficulties   Cognition Arousal: Alert Behavior During Therapy: Flat affect Overall Cognitive Status: Within Functional Limits for tasks assessed                                 General Comments: WFL, patient flat and minimally communicative     General Comments  VSS on RA    Exercises     Shoulder Instructions      Home Living Family/patient expects to be discharged to:: Private residence Living Arrangements: Spouse/significant other (spouse works pt states he is at home sometimes for short periods alone) Available Help at Discharge: Family;Available PRN/intermittently Type of Home: House Home Access: Ramped entrance     Home Layout: One level     Bathroom Shower/Tub: Arts development officer Toilet: Handicapped height     Home Equipment: Agricultural consultant (2 wheels);BSC/3in1;Shower seat;Grab bars - tub/shower;Wheelchair - Consulting civil engineer Comments: bedside commode over top toilet to allow arm rests to push up on      Prior Functioning/Environment Prior Level of Function : Independent/Modified Independent             Mobility Comments: Using wheelchair and scooter, pt states he was performing lateral transfers at mod I. ADLs Comments: Dresses and bathes himself        OT Problem List: Decreased strength;Decreased coordination;Decreased knowledge of use of DME or AE;Pain      OT Treatment/Interventions: Self-care/ADL training;Therapeutic exercise;Energy conservation;DME and/or AE instruction;Therapeutic activities;Patient/family education    OT Goals(Current goals can be found in the care plan section) Acute Rehab OT Goals Patient Stated  Goal: to go home OT Goal Formulation: With patient Time For Goal Achievement: 03/26/23 Potential to Achieve Goals: Good  OT Frequency: Min 1X/week    Co-evaluation              AM-PAC OT "6 Clicks" Daily Activity     Outcome Measure Help from another person eating meals?: A Little Help from another person taking care of personal grooming?: A Little Help from another person toileting, which includes using toliet, bedpan, or urinal?: A Lot Help from another person bathing (including washing, rinsing, drying)?: A Little Help from another person to put on and taking off regular upper body clothing?: A Little Help from another person to put on and taking off regular lower body clothing?: A Little 6 Click Score: 17   End of Session Nurse Communication: Mobility status  Activity Tolerance: Patient tolerated treatment well Patient left: in bed;with call bell/phone within reach;with bed alarm set  OT Visit Diagnosis: Unsteadiness on feet (R26.81);Other abnormalities of gait and mobility (R26.89);Pain Pain - Right/Left: Right Pain - part of body: Leg                Time: 1057-1107 OT Time Calculation (min): 10 min Charges:  OT General Charges $OT Visit: 1 Visit  OT Evaluation $OT Eval Low Complexity: 1 Low  Pollyann Glen E. Kellie Murrill, OTR/L Acute Rehabilitation Services [phone]   Cherlyn Cushing 03/12/2023, 11:46 AM

## 2023-03-12 NOTE — Plan of Care (Signed)
Problem: Self-Care: Goal: Ability to meet self-care needs will improve Outcome: Progressing   Problem: Pain Management: Goal: Pain level will decrease with appropriate interventions Outcome: Progressing   Problem: Skin Integrity: Goal: Demonstration of wound healing without infection will improve Outcome: Progressing

## 2023-03-13 LAB — CBC
HCT: 27.1 % — ABNORMAL LOW (ref 39.0–52.0)
HCT: 27.3 % — ABNORMAL LOW (ref 39.0–52.0)
Hemoglobin: 8.3 g/dL — ABNORMAL LOW (ref 13.0–17.0)
Hemoglobin: 8.5 g/dL — ABNORMAL LOW (ref 13.0–17.0)
MCH: 25.8 pg — ABNORMAL LOW (ref 26.0–34.0)
MCH: 25.7 pg — ABNORMAL LOW (ref 26.0–34.0)
MCHC: 30.6 g/dL (ref 30.0–36.0)
MCHC: 31.1 g/dL (ref 30.0–36.0)
MCV: 84.2 fL (ref 80.0–100.0)
MCV: 82.5 fL (ref 80.0–100.0)
Platelets: 359 10*3/uL (ref 150–400)
Platelets: 369 10*3/uL (ref 150–400)
RBC: 3.22 MIL/uL — ABNORMAL LOW (ref 4.22–5.81)
RBC: 3.31 MIL/uL — ABNORMAL LOW (ref 4.22–5.81)
RDW: 17.3 % — ABNORMAL HIGH (ref 11.5–15.5)
RDW: 17.2 % — ABNORMAL HIGH (ref 11.5–15.5)
WBC: 18.3 10*3/uL — ABNORMAL HIGH (ref 4.0–10.5)
WBC: 17.5 10*3/uL — ABNORMAL HIGH (ref 4.0–10.5)
nRBC: 0 % (ref 0.0–0.2)
nRBC: 0 % (ref 0.0–0.2)

## 2023-03-13 LAB — GLUCOSE, CAPILLARY
Glucose-Capillary: 119 mg/dL — ABNORMAL HIGH (ref 70–99)
Glucose-Capillary: 125 mg/dL — ABNORMAL HIGH (ref 70–99)
Glucose-Capillary: 105 mg/dL — ABNORMAL HIGH (ref 70–99)
Glucose-Capillary: 194 mg/dL — ABNORMAL HIGH (ref 70–99)

## 2023-03-13 LAB — BASIC METABOLIC PANEL
Anion gap: 7 (ref 5–15)
BUN: 12 mg/dL (ref 8–23)
CO2: 23 mmol/L (ref 22–32)
Calcium: 7.9 mg/dL — ABNORMAL LOW (ref 8.9–10.3)
Chloride: 103 mmol/L (ref 98–111)
Creatinine, Ser: 0.56 mg/dL — ABNORMAL LOW (ref 0.61–1.24)
GFR, Estimated: >60 mL/min (ref >60)
Glucose, Bld: 146 mg/dL — ABNORMAL HIGH (ref 70–99)
Potassium: 3.7 mmol/L (ref 3.5–5.1)
Sodium: 133 mmol/L — ABNORMAL LOW (ref 135–145)

## 2023-03-13 NOTE — Plan of Care (Signed)
  Problem: Self-Care: Goal: Ability to meet self-care needs will improve Outcome: Progressing   Problem: Pain Management: Goal: Pain level will decrease with appropriate interventions Outcome: Progressing   Problem: Metabolic: Goal: Ability to maintain appropriate glucose levels will improve Outcome: Progressing

## 2023-03-13 NOTE — Progress Notes (Signed)
Patient ID: Troy [name], male   DOB: 1960-12-16, 62 y.o.   MRN: [id number] Patient is postoperative day 2  transtibial amputation on the right.  Operative findings showed purulent abscess that extended up to the amputation site.  The wound was cleansed with Vashe and vancomycin powder 1 g and Kerecis micro graft were applied to the wound bed after cleansing.    Cultures today show Staph aureus.  Pending speciation.  Will plan for repeat CBC this morning.  Will mobilize with physical therapy.  Dispo pending further speciation

## 2023-03-14 LAB — GLUCOSE, CAPILLARY
Glucose-Capillary: 163 mg/dL — ABNORMAL HIGH (ref 70–99)
Glucose-Capillary: 109 mg/dL — ABNORMAL HIGH (ref 70–99)
Glucose-Capillary: 137 mg/dL — ABNORMAL HIGH (ref 70–99)
Glucose-Capillary: 135 mg/dL — ABNORMAL HIGH (ref 70–99)

## 2023-03-14 LAB — SURGICAL PATHOLOGY

## 2023-03-14 NOTE — Progress Notes (Signed)
Physical Therapy Treatment Patient Details Name: Troy [name] MRN: [id number] DOB: [birth year] Today's Date: 03/14/2023   History of Present Illness Pt is a 62 yo male presenting with dehiscence of R transmet amputation site. Pt is currently R BKA. PMH: CHF, COPD, DM2, HTN, restless leg syndrome, PTSD, RLE trans met amputation, L AKA    PT Comments  The pt was agreeable to session, reports he has no concerns regarding mobility or transfers to return home. Pt able to describe car transfers and assist from family to complete mobility at home, able to demo lateral scoot with good clearance of hips. Reviewed exercises for RLE following amputation to maintain muscle activation and ROM. Pt reports no concerns about planned d/c home tomorrow.     If plan is discharge home, recommend the following: A little help with walking and/or transfers;Assistance with cooking/housework   Can travel by private vehicle        Equipment Recommendations  None recommended by PT    Recommendations for Other Services       Precautions / Restrictions Precautions Precautions: Fall Restrictions Weight Bearing Restrictions: No     Mobility  Bed Mobility Overal bed mobility: Needs Assistance Bed Mobility: Supine to Sit, Sit to Supine     Supine to sit: Supervision Sit to supine: Supervision   General bed mobility comments: supervision to sit EOB    Transfers Overall transfer level: Needs assistance   Transfers: Bed to chair/wheelchair/BSC            Lateral/Scoot Transfers: Supervision General transfer comment: declined OOB, able to demo good clearance with hips for lateral scooting along EOB without assistance    Ambulation/Gait                   Stairs             Wheelchair Mobility     Tilt Bed    Modified Rankin (Stroke Patients Only)       Balance Overall balance assessment: Mild deficits observed, not formally tested                                           Cognition Arousal: Alert Behavior During Therapy: Flat affect Overall Cognitive Status: Within Functional Limits for tasks assessed                                 General Comments: WFL, patient flat and minimally communicative. able to describe transfers and mobility at home        Exercises Amputee Exercises Quad Sets: AROM, Right, 5 reps Hip ABduction/ADduction: AROM, Right, 5 reps    General Comments General comments (skin integrity, edema, etc.): discussed amputee exercises for RLE, car transfers, and pt verbalized understanding      Pertinent Vitals/Pain Pain Assessment Pain Assessment: Faces Faces Pain Scale: No hurt Pain Intervention(s): Monitored during session    Home Living                          Prior Function            PT Goals (current goals can now be found in the care plan section) Acute Rehab PT Goals Patient Stated Goal: Return home with spouse PT Goal Formulation: With patient Time For Goal Achievement: 03/26/23  Potential to Achieve Goals: Good Progress towards PT goals: Progressing toward goals    Frequency    Min 1X/week       AM-PAC PT "6 Clicks" Mobility   Outcome Measure  Help needed turning from your back to your side while in a flat bed without using bedrails?: A Little Help needed moving from lying on your back to sitting on the side of a flat bed without using bedrails?: A Little Help needed moving to and from a bed to a chair (including a wheelchair)?: A Little Help needed standing up from a chair using your arms (e.g., wheelchair or bedside chair)?: Total Help needed to walk in hospital room?: Total Help needed climbing 3-5 steps with a railing? : Total 6 Click Score: 12    End of Session   Activity Tolerance: Patient tolerated treatment well Patient left: in bed;with call bell/phone within reach;with bed alarm set Nurse Communication: Mobility status PT Visit Diagnosis:  Other abnormalities of gait and mobility (R26.89)     Time: 1431-1445 PT Time Calculation (min) (ACUTE ONLY): 14 min  Charges:    $Self Care/Home Management: 8-22 PT General Charges $$ ACUTE PT VISIT: 1 Visit                     Vickki Muff, PT, DPT   Acute Rehabilitation Department Office [phone] Secure Chat Communication Preferred   Ronnie Derby 03/14/2023, 4:02 PM

## 2023-03-14 NOTE — Progress Notes (Signed)
Patient ID: Troy [name], male   DOB: 10/15/1960, 62 y.o.   MRN: [id number] Patient is a 62 year old gentleman status post right transtibial amputation.  His white cell count has dropped from 28,002 17,000.  Hemoglobin stable at 8.5.  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  He is currently on IV vancomycin and has vancomycin powder within the wound.  Anticipate discharge to home on Tuesday with doxycycline.

## 2023-03-14 NOTE — Care Management Important Message (Signed)
Important Message  Patient Details  Name: Troy [name] MRN: [id number] Date of Birth: 08-11-1960   Medicare Important Message Given:  Yes     Sherilyn Banker 03/14/2023, 1:41 PM

## 2023-03-14 NOTE — Progress Notes (Signed)
Occupational Therapy Treatment Patient Details Name: Troy [name] MRN: [id number] DOB: 1960-07-24 Today's Date: 03/14/2023   History of present illness Pt is a 62 yo male presenting with dehiscence of R transmet amputation site. Pt is currently R BKA. PMH: CHF, COPD, DM2, HTN, restless leg syndrome, PTSD, RLE trans met amputation, L AKA   OT comments  Patient making progress towards goals, with continued limited participation noted. Patient continuing to refuse OOB activities, however willing to complete bed level bathing and change his gown as he was drenched in sweat. Patient set up for bathing activities. OT providing opportunities to practice transfers or OOB activities, with patient politely declining. OT recommendation remains appropriate, OT will continue to follow.      If plan is discharge home, recommend the following:  A little help with walking and/or transfers;A little help with bathing/dressing/bathroom;Assist for transportation;Help with stairs or ramp for entrance;Assistance with cooking/housework   Equipment Recommendations  None recommended by OT (patient has DME needed)    Recommendations for Other Services      Precautions / Restrictions Precautions Precautions: Fall Restrictions Weight Bearing Restrictions: No       Mobility Bed Mobility Overal bed mobility: Needs Assistance             General bed mobility comments: coming into long sitting for bathing ADL    Transfers                   General transfer comment: politely declining     Balance                                           ADL either performed or assessed with clinical judgement   ADL Overall ADL's : Needs assistance/impaired Eating/Feeding: Set up;Sitting   Grooming: Set up;Sitting   Upper Body Bathing: Set up;Sitting       Upper Body Dressing : Set up;Sitting                   Functional mobility during ADLs: Set up General ADL Comments:  Patient continuing to refuse OOB activities, however willing to complete bed level bathing and change his gown as he was drenched in sweat. Patient set up for bathing activities. OT providing opportunities to practice transfers or OOB activities, with patient politely declining. OT recommendation remains appropriate, OT will continue to follow.    Extremity/Trunk Assessment              Vision       Perception     Praxis      Cognition Arousal: Alert Behavior During Therapy: Flat affect Overall Cognitive Status: Within Functional Limits for tasks assessed                                 General Comments: WFL, patient flat and minimally communicative        Exercises      Shoulder Instructions       General Comments      Pertinent Vitals/ Pain       Pain Assessment Pain Assessment: Faces Faces Pain Scale: No hurt  Home Living  Prior Functioning/Environment              Frequency  Min 1X/week        Progress Toward Goals  OT Goals(current goals can now be found in the care plan section)  Progress towards OT goals: Progressing toward goals  Acute Rehab OT Goals Patient Stated Goal: to go home OT Goal Formulation: With patient Time For Goal Achievement: 03/26/23 Potential to Achieve Goals: Good  Plan      Co-evaluation                 AM-PAC OT "6 Clicks" Daily Activity     Outcome Measure   Help from another person eating meals?: A Little Help from another person taking care of personal grooming?: A Little Help from another person toileting, which includes using toliet, bedpan, or urinal?: A Lot Help from another person bathing (including washing, rinsing, drying)?: A Little Help from another person to put on and taking off regular upper body clothing?: A Little Help from another person to put on and taking off regular lower body clothing?: A Little 6 Click  Score: 17    End of Session    OT Visit Diagnosis: Unsteadiness on feet (R26.81);Other abnormalities of gait and mobility (R26.89);Pain   Activity Tolerance Patient tolerated treatment well   Patient Left in bed;with call bell/phone within reach   Nurse Communication Mobility status        Time: 1051-1100 OT Time Calculation (min): 9 min  Charges: OT General Charges $OT Visit: 1 Visit OT Treatments $Self Care/Home Management : 8-22 mins  Pollyann Glen E. Lorra Freeman, OTR/L Acute Rehabilitation Services [phone]   Cherlyn Cushing 03/14/2023, 1:28 PM

## 2023-03-15 LAB — GLUCOSE, CAPILLARY
Glucose-Capillary: 113 mg/dL — ABNORMAL HIGH (ref 70–99)
Glucose-Capillary: 124 mg/dL — ABNORMAL HIGH (ref 70–99)
Glucose-Capillary: 168 mg/dL — ABNORMAL HIGH (ref 70–99)
Glucose-Capillary: 160 mg/dL — ABNORMAL HIGH (ref 70–99)

## 2023-03-15 MED ORDER — ENSURE ENLIVE PO LIQD
237.0000 mL | Freq: Two times a day (BID) | ORAL | Status: DC
  Administered 2023-03-16 – 2023-03-23 (×5): 237 mL via ORAL

## 2023-03-15 MED ORDER — OXYCODONE-ACETAMINOPHEN 10-325 MG PO TABS: 1.0000 | ORAL_TABLET | ORAL | 0 refills | Status: DC | PRN

## 2023-03-15 MED ORDER — DOXYCYCLINE HYCLATE 100 MG PO TABS: 100.0000 mg | ORAL_TABLET | Freq: Two times a day (BID) | ORAL | 0 refills | Status: DC

## 2023-03-15 NOTE — Progress Notes (Signed)
Patient ID: Troy [name], male   DOB: [birth year], 62 y.o.   MRN: [id number] Patient is status post transtibial amputation.  Patient states he has all of the DME at home that he needs he states he is ready to go home.  Will discharge with the Praveena plus portable wound VAC pump and doxycycline for the MRSA.
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
## 2023-03-15 NOTE — Progress Notes (Signed)
Patient ID: Troy [name], male   DOB: 01/05/1961, 62 y.o.   MRN: [id number]  After further discussion with the patient and his wife they both would like to pursue skilled nursing placement at this time.  Discussed with the patient the importance of participating with therapy to ensure he could receive the necessary therapy at skilled nursing.  Will plan for discharge to skilled nursing in a day or 2 once a bed is available.  Continue vancomycin until discharge.

## 2023-03-15 NOTE — TOC Progression Note (Addendum)
Transition of Care [facility]) - Progression Note    Patient Details  Name: Troy [name] MRN: [id number] Date of Birth: 1961-06-03  Transition of Care [facility]) CM/SW Contact  Epifanio Lesches, RN Phone Number: 03/15/2023, 10:47 AM  Clinical Narrative:         - S/P R BKA , 9/20 Hx L AKA NCM spoke with pt with wife @ bedside regarding d/c to home with home health services. Wife got very angry left the hospital stating he's not coming home. " I spoke with doctor prior to surgery about him going to rehab. I can't take care of him." Wife leaves with pt crying. Pt states wants to go home.  Pt has been declining PT sessions... MD made aware of above situation. TOC team will continue to monitor and assist with needs.  03/15/2023 11:30 am Pt has agreed to work with therapy and has agreed to transition to [facility] @  discharge if needed after conversation with MD on speaker phone @ bedside. PT to see pt today....  Expected Discharge Plan: Home w Home Health Services Barriers to Discharge: Other (must enter comment) (Pt not working with PT, pt wants to go home, wife refusing to receive pt)  Expected Discharge Plan and Services         Expected Discharge Date: 03/15/23                                     Social Determinants of Health (SDOH) Interventions SDOH Screenings   Food Insecurity: No Food Insecurity (03/11/2023)  Housing: Low Risk  (03/11/2023)  Transportation Needs: No Transportation Needs (03/11/2023)  Utilities: Not At Risk (03/11/2023)  Financial Resource Strain: Low Risk  (04/09/2021)   Received from [facility], [facility]  Physical Activity: Insufficiently Active (04/09/2021)   Received from [facility], [facility]  Social Connections: Unknown (10/22/2021)   Received from [facility], [facility]  Stress: No Stress Concern Present (04/09/2021)   Received from [facility], [facility]  Tobacco Use: High Risk (03/11/2023)    Readmission Risk  Interventions    08/25/2021   12:46 PM  Readmission Risk Prevention Plan  Transportation Screening Complete  Medication Review (RN Care Manager) Complete  PCP or Specialist appointment within 3-5 days of discharge Complete  HRI or Home Care Consult Complete  SW Recovery Care/Counseling Consult Complete  Palliative Care Screening Not Applicable  Skilled Nursing Facility Complete

## 2023-03-15 NOTE — Progress Notes (Signed)
PT Cancellation Note  Patient Details Name: MUSAAB GEDDIS MRN: [id number] DOB: [birth year]   Cancelled Treatment:    Reason Eval/Treat Not Completed: (P) Patient declined, no reason specified PT will follow back tomorrow for treatment session.  Yerick Eggebrecht B. Beverely Risen PT, DPT Acute Rehabilitation Services Please use secure chat or  Call Office [phone]    Elon Alas [facility] 03/15/2023, 3:15 PM

## 2023-03-16 LAB — COMPREHENSIVE METABOLIC PANEL
ALT: 14 U/L (ref 0–44)
AST: 21 U/L (ref 15–41)
Albumin: 1.8 g/dL — ABNORMAL LOW (ref 3.5–5.0)
Alkaline Phosphatase: 67 U/L (ref 38–126)
Anion gap: 8 (ref 5–15)
BUN: 15 mg/dL (ref 8–23)
CO2: 21 mmol/L — ABNORMAL LOW (ref 22–32)
Calcium: 7.8 mg/dL — ABNORMAL LOW (ref 8.9–10.3)
Chloride: 107 mmol/L (ref 98–111)
Creatinine, Ser: 0.55 mg/dL — ABNORMAL LOW (ref 0.61–1.24)
GFR, Estimated: >60 mL/min (ref >60)
Glucose, Bld: 153 mg/dL — ABNORMAL HIGH (ref 70–99)
Potassium: 3.6 mmol/L (ref 3.5–5.1)
Sodium: 136 mmol/L (ref 135–145)
Total Bilirubin: 0.5 mg/dL (ref 0.3–1.2)
Total Protein: 6 g/dL — ABNORMAL LOW (ref 6.5–8.1)

## 2023-03-16 LAB — CBC
HCT: 25.1 % — ABNORMAL LOW (ref 39.0–52.0)
Hemoglobin: 7.9 g/dL — ABNORMAL LOW (ref 13.0–17.0)
MCH: 25.8 pg — ABNORMAL LOW (ref 26.0–34.0)
MCHC: 31.5 g/dL (ref 30.0–36.0)
MCV: 82 fL (ref 80.0–100.0)
Platelets: 304 10*3/uL (ref 150–400)
RBC: 3.06 MIL/uL — ABNORMAL LOW (ref 4.22–5.81)
RDW: 17.2 % — ABNORMAL HIGH (ref 11.5–15.5)
WBC: 22.8 10*3/uL — ABNORMAL HIGH (ref 4.0–10.5)
nRBC: 0 % (ref 0.0–0.2)

## 2023-03-16 LAB — VANCOMYCIN, TROUGH: Vancomycin Tr: 14 ug/mL — ABNORMAL LOW (ref 15–20)

## 2023-03-16 LAB — GLUCOSE, CAPILLARY
Glucose-Capillary: 139 mg/dL — ABNORMAL HIGH (ref 70–99)
Glucose-Capillary: 157 mg/dL — ABNORMAL HIGH (ref 70–99)
Glucose-Capillary: 134 mg/dL — ABNORMAL HIGH (ref 70–99)
Glucose-Capillary: 158 mg/dL — ABNORMAL HIGH (ref 70–99)

## 2023-03-16 LAB — AEROBIC/ANAEROBIC CULTURE W GRAM STAIN (SURGICAL/DEEP WOUND)

## 2023-03-16 LAB — PREPARE RBC (CROSSMATCH)

## 2023-03-16 MED ORDER — SODIUM CHLORIDE 0.9% IV SOLUTION: Freq: Once | INTRAVENOUS | Status: AC

## 2023-03-16 MED ORDER — VANCOMYCIN HCL 1500 MG/300ML IV SOLN
1500.0000 mg | Freq: Two times a day (BID) | INTRAVENOUS | Status: DC
  Administered 2023-03-16 – 2023-03-18 (×4): 1500 mg via INTRAVENOUS
  Filled 2023-03-16 (×4): qty 300

## 2023-03-16 MED ORDER — SODIUM CHLORIDE 0.9 % IV BOLUS
500.0000 mL | Freq: Once | INTRAVENOUS | Status: AC | PRN
  Administered 2023-03-17: 500 mL via INTRAVENOUS

## 2023-03-16 NOTE — Progress Notes (Signed)
Patient ID: Troy [name], male   DOB: 1960-09-20, 62 y.o.   MRN: [id number] Patient is seen in follow-up right transtibial amputation.  Patient has been hypotensive today.  He received 1 unit of packed red blood cells and his blood pressure has improved.  Patient has been asymptomatic.  Patient's heart rate has been elevated and order placed for metoprolol to see if this would help the atrial flutter.  Anticipate physical therapy once patient's vital signs have stabilized.  Plan for discharge to skilled nursing.

## 2023-03-16 NOTE — Progress Notes (Signed)
PT Cancellation Note  Patient Details Name: Troy [name] MRN: [id number] DOB: [birth year]   Cancelled Treatment:    Reason Eval/Treat Not Completed: Medical issues which prohibited therapy per chart, rapid response called this morning due to hypotension and elevated HR into the 140s. Will monitor from a distance and attempt to return if time and schedule allow.   Nedra Hai, PT, DPT 03/16/23 11:22 AM

## 2023-03-16 NOTE — Plan of Care (Signed)
  Problem: Education: Goal: Knowledge of the prescribed therapeutic regimen will improve Outcome: Progressing Goal: Ability to verbalize activity precautions or restrictions will improve Outcome: Progressing Goal: Understanding of discharge needs will improve Outcome: Progressing   Problem: Activity: Goal: Ability to perform//tolerate increased activity and mobilize with assistive devices will improve Outcome: Progressing   Problem: Clinical Measurements: Goal: Postoperative complications will be avoided or minimized Outcome: Progressing   Problem: Skin Integrity: Goal: Demonstration of wound healing without infection will improve Outcome: Progressing   Problem: Cardiovascular: Goal: Ability to achieve and maintain adequate cardiovascular perfusion will improve Outcome: Progressing Goal: Vascular access site(s) Level 0-1 will be maintained Outcome: Progressing   Problem: Education: Goal: Knowledge of General Education information will improve Description: Including pain rating scale, medication(s)/side effects and non-pharmacologic comfort measures Outcome: Progressing   Problem: Health Behavior/Discharge Planning: Goal: Ability to manage health-related needs will improve Outcome: Progressing   Problem: Skin Integrity: Goal: Risk for impaired skin integrity will decrease Outcome: Progressing

## 2023-03-16 NOTE — Significant Event (Signed)
Rapid Response Event Note   Reason for Call :  Hypotension  Initial Focused Assessment:  On arrival, pt laying in bed A&O. Skin pale and warm. RN states BP has been soft and HR elevated. Pt treated for pain about an hour prior.   Vitals: HR 147, BP 100/63, RR 16, spO2 100% on RA.   Pt denies feeling lightheaded or dizzy, currently c/o pain to RLE and does not like HOB being flat stating "I want to go home".   Interventions:  EKG- A Flutter bolus CBC BMP  Plan of Care:  Continue to monitor pt. Plan to give bolus and once BP improved will give Lopressor for HR. Md and RN notified.  RN instructed to call with any changes or concerns.    Event Summary:   MD Notified:Dr Lajoyce Corners notified @ [phone] Call Time: 0918 Arrival Time:0920 End Time: 0946  Mordecai Rasmussen, RN

## 2023-03-16 NOTE — Progress Notes (Signed)
PHARMACY ANTIBIOTIC CONSULT NOTE   Troy [name] a 62 y.o. 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 MRSA PCR detected.  Pharmacy has been consulted for Vancomycin dosing. Patient received vancomycin powder in OR, but pre-op abx used was cefazolin.   9/25: Patient OR cx grew MRSA- awaiting discharge to SNF. Planning to continue vancomycin until discharge. Vancomycin trough drawn 9/25 at 10/39 (~1.5 hours early). Trough resulted at 14, can be extrapolated to ~12 based on early draw. To target trough of ~15 for contaminated margins, will plan to increase dose.    Estimated Creatinine Clearance: 100.7 mL/min (A) (by C-G formula based on SCr of 0.56 mg/dL (L)).  Plan: Increase vancomycin to 1,500 mg IV Q12H  Plan to transition to doxycycline at discharge  F/U DOT   Allergies:  Allergies  Allergen Reactions   Hydromorphone Hcl Er Anaphylaxis and Other (See Comments)    Allergic to DYE in extended-release tablet, can tolerate other forms of hydromorphone   Tapentadol Anaphylaxis, Swelling and Other (See Comments)    THROAT ANGIOEDEMA Nucynta [Tapentadol Hydrochloride]   Exalamide Other (See Comments)    UNSPECIFIED REACTION     Filed Weights   03/11/23 0709  Weight: 79.4 kg (175 lb)       Latest Ref Rng & Units 03/13/2023   10:40 AM 03/13/2023    4:51 AM 03/12/2023    4:12 AM  CBC  WBC 4.0 - 10.5 K/uL 17.5  18.3  20.3   Hemoglobin 13.0 - 17.0 g/dL 8.5  8.3  8.6   Hematocrit 39.0 - 52.0 % 27.3  27.1  27.0   Platelets 150 - 400 K/uL 369  359  398     Antibiotics Given (last 72 hours)     Date/Time Action Medication Dose Rate   03/13/23 1246 New Bag/Given   cefTRIAXone (ROCEPHIN) 2 g in sodium chloride 0.9 % 100 mL IVPB 2 g 200 mL/hr   03/13/23 1344 New Bag/Given   vancomycin (VANCOREADY) IVPB 1250 mg/250 mL 1,250 mg 166.7 mL/hr   03/13/23 2103 Given   metroNIDAZOLE (FLAGYL) tablet 500 mg 500 mg    03/14/23 0116 New Bag/Given    vancomycin (VANCOREADY) IVPB 1250 mg/250 mL 1,250 mg 166.7 mL/hr   03/14/23 0844 Given   metroNIDAZOLE (FLAGYL) tablet 500 mg 500 mg    03/14/23 1335 New Bag/Given   vancomycin (VANCOREADY) IVPB 1250 mg/250 mL 1,250 mg 166.7 mL/hr   03/14/23 2100 Given   metroNIDAZOLE (FLAGYL) tablet 500 mg 500 mg    03/15/23 0000 New Bag/Given   vancomycin (VANCOREADY) IVPB 1250 mg/250 mL 1,250 mg 166.7 mL/hr   03/15/23 0901 Given   metroNIDAZOLE (FLAGYL) tablet 500 mg 500 mg    03/15/23 1216 New Bag/Given   vancomycin (VANCOREADY) IVPB 1250 mg/250 mL 1,250 mg 166.7 mL/hr   03/16/23 0001 New Bag/Given   vancomycin (VANCOREADY) IVPB 1250 mg/250 mL 1,250 mg 166.7 mL/hr       Vanc 9/20>>c  CRO 9/20>>9/22  Flagyl 9/20>>9/24  MRSA PCR detected  9/20 OR cx: MRSA  Thank you for allowing pharmacy to be a part of this patient's care.  Jani Gravel, PharmD Clinical Pharmacist  03/16/2023 9:18 AM

## 2023-03-16 NOTE — Plan of Care (Signed)
  Problem: Pain Management: Goal: Pain level will decrease with appropriate interventions Outcome: Progressing   Problem: Nutritional: Goal: Maintenance of adequate nutrition will improve Outcome: Progressing   Problem: Activity: Goal: Ability to perform//tolerate increased activity and mobilize with assistive devices will improve Outcome: Not Progressing   Problem: Activity: Goal: Ability to return to baseline activity level will improve Outcome: Not Progressing   Problem: Coping: Goal: Ability to adjust to condition or change in health will improve Outcome: Not Progressing

## 2023-03-16 NOTE — Progress Notes (Signed)
OT Cancellation Note  Patient Details Name: Troy [name] MRN: [id number] DOB: 1961/06/19   Cancelled Treatment:    Reason Eval/Treat Not Completed: Medical issues which prohibited therapy Rapid Response called this AM due to HR, OT will follow back when medically appropriate.   Cherlyn Cushing 03/16/2023, 2:21 PM

## 2023-03-17 ENCOUNTER — Inpatient Hospital Stay (HOSPITAL_COMMUNITY): Payer: Medicare Other | Admitting: Anesthesiology

## 2023-03-17 ENCOUNTER — Inpatient Hospital Stay (HOSPITAL_COMMUNITY): Payer: Medicare Other

## 2023-03-17 ENCOUNTER — Encounter (HOSPITAL_COMMUNITY)
Admission: RE | Disposition: A | Discharge status: Skilled Nursing Facility | Payer: Self-pay | Source: Home / Self Care | Attending: Orthopedic Surgery

## 2023-03-17 DIAGNOSIS — I483 Typical atrial flutter: Secondary | ICD-10-CM

## 2023-03-17 DIAGNOSIS — I4892 Unspecified atrial flutter: Secondary | ICD-10-CM

## 2023-03-17 HISTORY — PX: CARDIOVERSION: SHX1299

## 2023-03-17 HISTORY — PX: TEE WITHOUT CARDIOVERSION: SHX5443

## 2023-03-17 LAB — CBC
HCT: 28.9 % — ABNORMAL LOW (ref 39.0–52.0)
Hemoglobin: 9.2 g/dL — ABNORMAL LOW (ref 13.0–17.0)
MCH: 27.1 pg (ref 26.0–34.0)
MCHC: 31.8 g/dL (ref 30.0–36.0)
MCV: 85 fL (ref 80.0–100.0)
Platelets: 354 10*3/uL (ref 150–400)
RBC: 3.4 MIL/uL — ABNORMAL LOW (ref 4.22–5.81)
RDW: 17.6 % — ABNORMAL HIGH (ref 11.5–15.5)
WBC: 18.4 10*3/uL — ABNORMAL HIGH (ref 4.0–10.5)
nRBC: 0 % (ref 0.0–0.2)

## 2023-03-17 LAB — TYPE AND SCREEN
ABO/RH(D): O POS
Antibody Screen: NEGATIVE
Unit division: 0

## 2023-03-17 LAB — BASIC METABOLIC PANEL
Anion gap: 5 (ref 5–15)
BUN: 22 mg/dL (ref 8–23)
CO2: 19 mmol/L — ABNORMAL LOW (ref 22–32)
Calcium: 7.6 mg/dL — ABNORMAL LOW (ref 8.9–10.3)
Chloride: 111 mmol/L (ref 98–111)
Creatinine, Ser: 0.66 mg/dL (ref 0.61–1.24)
GFR, Estimated: >60 mL/min (ref >60)
Glucose, Bld: 72 mg/dL (ref 70–99)
Potassium: 3.7 mmol/L (ref 3.5–5.1)
Sodium: 135 mmol/L (ref 135–145)

## 2023-03-17 LAB — BPAM RBC
Blood Product Expiration Date: 202410222359
ISSUE DATE / TIME: 202409251513
Unit Type and Rh: 5100

## 2023-03-17 LAB — GLUCOSE, CAPILLARY
Glucose-Capillary: 87 mg/dL (ref 70–99)
Glucose-Capillary: 90 mg/dL (ref 70–99)
Glucose-Capillary: 172 mg/dL — ABNORMAL HIGH (ref 70–99)
Glucose-Capillary: 195 mg/dL — ABNORMAL HIGH (ref 70–99)

## 2023-03-17 LAB — ECHO TEE

## 2023-03-17 SURGERY — ECHOCARDIOGRAM, TRANSESOPHAGEAL
Anesthesia: Monitor Anesthesia Care

## 2023-03-17 MED ORDER — PROPOFOL 500 MG/50ML IV EMUL
INTRAVENOUS | Status: DC | PRN
  Administered 2023-03-17: 100 ug/kg/min via INTRAVENOUS

## 2023-03-17 MED ORDER — LIDOCAINE 2% (20 MG/ML) 5 ML SYRINGE
INTRAMUSCULAR | Status: DC | PRN
  Administered 2023-03-17: 60 mg via INTRAVENOUS

## 2023-03-17 MED ORDER — METOPROLOL SUCCINATE ER 25 MG PO TB24
25.0000 mg | ORAL_TABLET | Freq: Every day | ORAL | Status: DC
  Administered 2023-03-17 – 2023-03-24 (×8): 25 mg via ORAL
  Filled 2023-03-17 (×8): qty 1

## 2023-03-17 MED ORDER — AMIODARONE HCL IN DEXTROSE 360-4.14 MG/200ML-% IV SOLN
30.0000 mg/h | INTRAVENOUS | Status: DC
  Administered 2023-03-18 – 2023-03-20 (×5): 30 mg/h via INTRAVENOUS
  Filled 2023-03-17 (×6): qty 200

## 2023-03-17 MED ORDER — CLOPIDOGREL BISULFATE 75 MG PO TABS
75.0000 mg | ORAL_TABLET | Freq: Every day | ORAL | Status: DC
  Administered 2023-03-18 – 2023-03-24 (×7): 75 mg via ORAL
  Filled 2023-03-17 (×7): qty 1

## 2023-03-17 MED ORDER — INSULIN GLARGINE-YFGN 100 UNIT/ML ~~LOC~~ SOLN
20.0000 [IU] | Freq: Every day | SUBCUTANEOUS | Status: DC
  Administered 2023-03-17 – 2023-03-23 (×7): 20 [IU] via SUBCUTANEOUS
  Filled 2023-03-17 (×8): qty 0.2

## 2023-03-17 MED ORDER — AMIODARONE LOAD VIA INFUSION
150.0000 mg | Freq: Once | INTRAVENOUS | Status: AC
  Administered 2023-03-17: 150 mg via INTRAVENOUS
  Filled 2023-03-17: qty 83.34

## 2023-03-17 MED ORDER — PROPOFOL 10 MG/ML IV BOLUS
INTRAVENOUS | Status: DC | PRN
Start: 2023-03-17 — End: 2023-03-17
  Administered 2023-03-17: 40 mg via INTRAVENOUS

## 2023-03-17 MED ORDER — SODIUM CHLORIDE 0.9 % IV SOLN: INTRAVENOUS | Status: DC

## 2023-03-17 MED ORDER — AMIODARONE HCL IN DEXTROSE 360-4.14 MG/200ML-% IV SOLN
60.0000 mg/h | INTRAVENOUS | Status: AC
  Administered 2023-03-17 (×2): 60 mg/h via INTRAVENOUS
  Filled 2023-03-17 (×2): qty 200

## 2023-03-17 MED ORDER — PHENYLEPHRINE 80 MCG/ML (10ML) SYRINGE FOR IV PUSH (FOR BLOOD PRESSURE SUPPORT)
PREFILLED_SYRINGE | INTRAVENOUS | Status: DC | PRN
  Administered 2023-03-17: 80 ug via INTRAVENOUS

## 2023-03-17 SURGICAL SUPPLY — 1 items: ELECT DEFIB PAD ADLT CADENCE (PAD) ×1 IMPLANT

## 2023-03-17 NOTE — Progress Notes (Signed)
PT Cancellation Note  Patient Details Name: Troy [name] MRN: [id number] DOB: Nov 24, 1960   Cancelled Treatment:    Reason Eval/Treat Not Completed: Patient at procedure or test/unavailable this morning as he is off unit for echocardiogram and cardioversion, RN states likely to transfer to progressive unit after procedure. Will continue to follow and re-evaluate as needed.   Vickki Muff, PT, DPT   Acute Rehabilitation Department Office [phone] Secure Chat Communication Preferred   Ronnie Derby 03/17/2023, 10:52 AM

## 2023-03-17 NOTE — Transfer of Care (Signed)
Immediate Anesthesia Transfer of Care Note  Patient: KHALEN BETTON  Procedure(s) Performed: TRANSESOPHAGEAL ECHOCARDIOGRAM CARDIOVERSION  Patient Location: PACU and Cath Lab  Anesthesia Type:MAC  Level of Consciousness: drowsy  Airway & Oxygen Therapy: Patient Spontanous Breathing and Patient connected to nasal cannula oxygen  Post-op Assessment: Report given to RN and Post -op Vital signs reviewed and stable  Post vital signs: Reviewed and stable  Last Vitals:  Vitals Value Taken Time  BP 109/66   Temp    Pulse 77   Resp 17   SpO2 98     Last Pain:  Vitals:   03/17/23 0953  TempSrc: Temporal  PainSc:       Patients Stated Pain Goal: 3 (03/15/23 0427)  Complications: No notable events documented.

## 2023-03-17 NOTE — Plan of Care (Signed)
  Problem: Education: Goal: Knowledge of the prescribed therapeutic regimen will improve Outcome: Progressing Goal: Ability to verbalize activity precautions or restrictions will improve Outcome: Progressing Goal: Understanding of discharge needs will improve Outcome: Progressing   Problem: Activity: Goal: Ability to perform//tolerate increased activity and mobilize with assistive devices will improve Outcome: Progressing   Problem: Skin Integrity: Goal: Demonstration of wound healing without infection will improve Outcome: Progressing   Problem: Education: Goal: Ability to describe self-care measures that may prevent or decrease complications (Diabetes Survival Skills Education) will improve Outcome: Progressing Goal: Individualized Educational Video(s) Outcome: Progressing

## 2023-03-17 NOTE — Progress Notes (Signed)
Patient ID: Troy [name], male   DOB: Sep 15, 1960, 62 y.o.   MRN: [id number] Patient is status post right transtibial amputation.  Patient's blood pressure has improved after transfusion.  Patient has persistent atrial flutter, asymptomatic.  Thank you for cardiology consult.  Patient to undergo cardioversion and TEE today.  Still planning on discharge to skilled nursing.  Again reinforced the importance of participating with physical therapy.

## 2023-03-17 NOTE — Progress Notes (Signed)
Cardiologist at bedside with patient. Per cardiologist patient is asymptomatic  Cardioversion will be performed later in the AM. Pt sustaining 150 and above HR

## 2023-03-17 NOTE — Interval H&P Note (Signed)
History and Physical Interval Note:  03/17/2023 9:12 AM  Troy [name]  has presented today for surgery, with the diagnosis of aflutter.  The various methods of treatment have been discussed with the patient and family. After consideration of risks, benefits and other options for treatment, the patient has consented to  Procedure(s): TRANSESOPHAGEAL ECHOCARDIOGRAM (N/A) CARDIOVERSION (N/A) as a surgical intervention.  The patient's history has been reviewed, patient examined, no change in status, stable for surgery.  I have reviewed the patient's chart and labs.  Questions were answered to the patient's satisfaction.     Chrystie Nose

## 2023-03-17 NOTE — Progress Notes (Signed)
Patient is s/p right transtibial amputation. His heart rate is currently 155 in a-flutter per telemetry. Assessed patient this morning, patient is asymptomatic. Per night shift RN and Dr. Lajoyce Corners patient is undergoing cardioversion and TEE this morning, but no orders for NPO or procedures (cardioversion and TEE) are ordered. Contacted provider for orders for NPO and procedures. Gaylyn Rong, RN notified the patient of NPO status, patient is agreeable and verbalized understanding of NPO status. Consulting civil engineer on [employer] notified of patients status, per Consulting civil engineer, night shift rapid response was contacted this morning as well.

## 2023-03-17 NOTE — Anesthesia Postprocedure Evaluation (Signed)
Anesthesia Post Note  Patient: Troy [name]  Procedure(s) Performed: TRANSESOPHAGEAL ECHOCARDIOGRAM CARDIOVERSION     Patient location during evaluation: PACU Anesthesia Type: General Level of consciousness: awake and alert Pain management: pain level controlled Vital Signs Assessment: post-procedure vital signs reviewed and stable Respiratory status: spontaneous breathing, nonlabored ventilation and respiratory function stable Cardiovascular status: stable and blood pressure returned to baseline Anesthetic complications: no   No notable events documented.  Last Vitals:  Vitals:   03/17/23 1104 03/17/23 1118  BP: 94/67 (!) 98/59  Pulse: 83 89  Resp:  18  Temp: 36.4 C   SpO2:  97%    Last Pain:  Vitals:   03/17/23 1153  TempSrc:   PainSc: 4                  Beryle Lathe

## 2023-03-17 NOTE — Progress Notes (Addendum)
Pt HR elevated and sustaining 150. Systolic BP ranging 80-90's PRN bolus administered. Cardiac monitoring placed. MD notified and Rapid Response Nurse Theodoro Grist consulted. No new orders received.  On call provider contacted again due to patient HR. Consult placed for cardiology per MD.

## 2023-03-17 NOTE — Consult Note (Addendum)
Cardiology Consultation   Patient ID: Troy [name] MRN: [id number]; DOB: [birth year]  Admit date: 03/11/2023 Date of Consult: 03/17/2023  PCP:  Oneita Hurt, No   [facility] Providers Cardiologist:  None        Patient Profile:   Troy [name] is a 62 y.o. male with a hx of PAD status post right SFA stent, type 2 diabetes, hypertension, hyperlipidemia, left lower extremity above-the-knee amputation who was admitted for right BKA who is being seen 03/17/2023 for the evaluation of atrial flutter at the request of Dr. Ophelia Charter.  History of Present Illness:   Troy [name] was admitted on September 20 with right transmetatarsal wound dehiscence and had a subsequent right BKA with Dr. Aldean Baker.  His course has been complicated by atrial flutter with rapid ventricular response up to heart rate of 150.  Today he received a unit of packed red blood cells and fluid resuscitation for soft blood pressures.  He was then treated with IV metoprolol with slight improvement in his heart rate but heart rate has increased again to 150 bpm.   Patient reports a long history of atrial flutter with poor rhythm awareness.  He reports no presyncope, chest pain, dyspnea, or palpitations at this time.  His only complaint is severe right leg pain at the site of his amputation.  Blood pressure throughout the day was as low as systolics in the 80s.  At the time of my arrival his blood pressure was systolic 100s over 70s with a MAP in the 80s and heart rate at 150.  Past Medical History:  Diagnosis Date   Acute renal failure (HCC)    in setting of NSAID use and orthopedic surgery 2010   Anxiety and depression    Chronic diastolic CHF (congestive heart failure) (HCC)    a. Echo 6/17: severe conc LVH, vigorous EF, EF 65-70%, no dynamic obstruction, no RWMA, Gr 1 DD, mild TR  //  b. LHC 8/17: no sig CAD, LVEDP 28   Cirrhosis of liver (HCC)    COPD (chronic obstructive pulmonary disease) (HCC)    Diabetic ulcer of  left foot (HCC)    DM2 (diabetes mellitus, type 2) (HCC)    Dysrhythmia    Family history of early CAD    GERD (gastroesophageal reflux disease)    History of amputation of foot (HCC)    L trans-met // R toe   History of cardiac catheterization    a. LHC 2002: irregs  //  b. LHC in 8/17: no sig CAD, apical DK, hyperdynamic LV, LVEDP 28   History of kidney stones    History of nuclear stress test    a. Nuc 7/17: Overall, intermediate risk nuclear stress test secondary to small size of apical lateral defect and reduced ejection fraction.  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    Kidney calculi    Palpitations    Pneumonia    PTSD (post-traumatic stress disorder)    Tobacco abuse     Past Surgical History:  Procedure Laterality Date   ABDOMINAL AORTOGRAM W/LOWER EXTREMITY N/A 01/31/2023   Procedure: ABDOMINAL AORTOGRAM W/LOWER EXTREMITY;  Surgeon: Maeola Harman, MD;  Location: [facility] INVASIVE CV LAB;  Service: Cardiovascular;  Laterality: N/A;   AMPUTATION Left 01/03/2018   Procedure: LEFT MIDFOOT AMPUTATION/REVISION MIDAMPUTAION;  Surgeon: Kathryne Hitch, MD;  Location: [facility] OR;  Service: Orthopedics;  Laterality: Left;   AMPUTATION Left 01/25/2018   Procedure: LEFT BELOW KNEE AMPUTATION;  Surgeon: Nadara Mustard, MD;  Location: [facility] OR;  Service: Orthopedics;  Laterality: Left;   AMPUTATION Left 01/21/2021   Procedure: LEFT ABOVE KNEE AMPUTATION;  Surgeon: Nadara Mustard, MD;  Location: [facility] OR;  Service: Orthopedics;  Laterality: Left;   AMPUTATION Right 02/09/2023   Procedure: RIGHT TRANSMETATARSAL AMPUTATION;  Surgeon: Nadara Mustard, MD;  Location: [facility] OR;  Service: Orthopedics;  Laterality: Right;   AMPUTATION Right 03/11/2023   Procedure: RIGHT BELOW KNEE AMPUTATION;  Surgeon: Nadara Mustard, MD;  Location: [facility] OR;  Service: Orthopedics;  Laterality: Right;    AMPUTATION TOE Right 07/17/2019   Procedure: AMPUTATION RIGHT FOOT 2ND TOE;  Surgeon: Kathryne Hitch, MD;  Location: [facility];  Service: Orthopedics;  Laterality: Right;   APPLICATION OF WOUND VAC Left 01/21/2021   Procedure: APPLICATION OF WOUND VAC;  Surgeon: Nadara Mustard, MD;  Location: [facility] OR;  Service: Orthopedics;  Laterality: Left;   BELOW KNEE LEG AMPUTATION Left 01/25/2018   CARDIAC CATHETERIZATION N/A 01/22/2016   Procedure: Left Heart Cath and Coronary Angiography;  Surgeon: Peter M Swaziland, MD;  Location: [facility] INVASIVE CV LAB;  Service: Cardiovascular;  Laterality: N/A;   FOOT AMPUTATION Bilateral    I & D EXTREMITY Left 12/15/2017   Procedure: IRRIGATION AND DEBRIDEMENT LEFT FOOT ULCER;  Surgeon: Kathryne Hitch, MD;  Location: [facility] ORS;  Service: Orthopedics;  Laterality: Left;   I & D EXTREMITY Left 07/25/2020   Procedure: LEFT BELOW KNEE AMPUTATION ABSCESS EXCISION AND SKIN GRAFT;  Surgeon: Nadara Mustard, MD;  Location: [facility] OR;  Service: Orthopedics;  Laterality: Left;   I & D EXTREMITY Left 08/22/2020   Procedure: DEBRIDEMENT LEFT BELOW KNEE AMPUTATION AND APPLY KERECIS SKIN GRAFT;  Surgeon: Nadara Mustard, MD;  Location: [facility] OR;  Service: Orthopedics;  Laterality: Left;   LITHOTRIPSY     PERIPHERAL VASCULAR INTERVENTION  01/31/2023   Procedure: PERIPHERAL VASCULAR INTERVENTION;  Surgeon: Maeola Harman, MD;  Location: [facility] INVASIVE CV LAB;  Service: Cardiovascular;;   TENDON LENGTHENING Bilateral    calf   TONSILLECTOMY       Home Medications:  Prior to Admission medications   Medication Sig Start Date End Date Taking? Authorizing Provider  amoxicillin-clavulanate (AUGMENTIN) 500-125 MG tablet Take 1 tablet by mouth 3 (three) times daily. 03/04/23  Yes Adonis Huguenin, NP  apixaban (ELIQUIS) 5 MG TABS tablet Take 1 tablet (5 mg total) by mouth 2 (two) times daily. 02/16/23 05/17/23 Yes [place], Alvira Philips, DO  aspirin EC 81 MG tablet Take 1 tablet (81  mg total) by mouth daily with breakfast. Swallow whole. 02/17/23 03/19/23 Yes [place], Alvira Philips, DO  clopidogrel (PLAVIX) 75 MG tablet Take 1 tablet (75 mg total) by mouth daily. 01/31/23 01/31/24 Yes Maeola Harman, MD  doxycycline (VIBRA-TABS) 100 MG tablet Take 1 tablet (100 mg total) by mouth 2 (two) times daily. 03/15/23  Yes Nadara Mustard, MD  empagliflozin ([name]) 10 MG TABS tablet Take 1 tablet (10 mg total) by mouth daily before breakfast. 02/23/23 08/22/23 Yes Meng, Wynema Birch, PA  glipiZIDE (GLUCOTROL) 10 MG tablet Take 10 mg by mouth 2 (two) times daily. 03/03/23  Yes [provider]  insulin degludec (TRESIBA) 100 UNIT/ML FlexTouch Pen Inject 40 Units into the skin daily. Patient taking differently: Inject 40-42 Units  into the skin daily. 02/16/23 05/17/23 Yes [place], Eric J, DO  insulin lispro (HUMALOG) 100 UNIT/ML KwikPen Inject 18 Units into the skin 3 (three) times daily. Patient taking differently: Inject 18-22 Units into the skin 3 (three) times daily. 02/16/23 05/17/23 Yes [place], Eric J, DO  pantoprazole (PROTONIX) 40 MG tablet Take 1 tablet (40 mg total) by mouth daily. 02/17/23 05/18/23 Yes [place], Eric J, DO  rosuvastatin (CRESTOR) 20 MG tablet Take 1 tablet (20 mg total) by mouth daily. 02/17/23 05/18/23 Yes [place], Alvira Philips, DO  Continuous Glucose Receiver (FREESTYLE LIBRE 3 READER) DEVI Use as directed with lifestyle libre sensors 02/16/23   [place], Alvira Philips, DO  Continuous Glucose Sensor (FREESTYLE LIBRE 3 SENSOR) MISC Place 1 sensor on the skin every 14 days. Use to check glucose continuously 02/16/23   [place], Alvira Philips, DO  Insulin Pen Needle (PEN NEEDLES 3/16") 31G X 5 MM MISC Use as directed with insulin pen 02/16/23   [place], Alvira Philips, DO  oxyCODONE-acetaminophen (PERCOCET) 10-325 MG tablet Take 1 tablet by mouth every 4 (four) hours as needed for pain. 03/15/23   Nadara Mustard, MD    Inpatient Medications: Scheduled Meds:  apixaban  5 mg Oral BID   vitamin C  1,000  mg Oral Daily   aspirin EC  81 mg Oral Q breakfast   Chlorhexidine Gluconate Cloth  6 each Topical Q0600   docusate sodium  100 mg Oral Daily   empagliflozin  10 mg Oral QAC breakfast   feeding supplement  237 mL Oral BID BM   glipiZIDE  10 mg Oral BID   insulin aspart  0-15 Units Subcutaneous TID WC   insulin aspart  4 Units Subcutaneous TID WC   insulin glargine-yfgn  40 Units Subcutaneous Daily   nutrition supplement (JUVEN)  1 packet Oral BID BM   pantoprazole  40 mg Oral Daily   rosuvastatin  20 mg Oral Daily   zinc sulfate  220 mg Oral Daily   Continuous Infusions:  sodium chloride 75 mL/hr at 03/11/23 1219   magnesium sulfate bolus IVPB     sodium chloride     vancomycin 1,500 mg (03/16/23 2356)   PRN Meds: acetaminophen, alum & mag hydroxide-simeth, bisacodyl, guaiFENesin-dextromethorphan, hydrALAZINE, HYDROmorphone (DILAUDID) injection, labetalol, magnesium citrate, magnesium sulfate bolus IVPB, metoprolol tartrate, ondansetron, oxyCODONE, oxyCODONE, phenol, polyethylene glycol, potassium chloride, sodium chloride  Allergies:    Allergies  Allergen Reactions   Hydromorphone Hcl Er Anaphylaxis and Other (See Comments)    Allergic to DYE in extended-release tablet, can tolerate other forms of hydromorphone   Tapentadol Anaphylaxis, Swelling and Other (See Comments)    THROAT ANGIOEDEMA Nucynta [Tapentadol Hydrochloride]   Exalamide Other (See Comments)    UNSPECIFIED REACTION     Social History:   Social History   Socioeconomic History   Marital status: Married    Spouse name: Not on file   Number of children: 1   Years of education: Not on file   Highest education level: Not on file  Occupational History   Occupation: DISABLED  Tobacco Use   Smoking status: Every Day    Current packs/day: 1.00    Average packs/day: 1 pack/day for 40.0 years (40.0 ttl pk-yrs)    Types: Cigarettes   Smokeless tobacco: Never  Vaping Use   Vaping status: Never Used   Substance and Sexual Activity   Alcohol use: No   Drug use: No   Sexual activity: Not on file  Other Topics Concern  Not on file  Social History Narrative   Not on file   Social Determinants of Health   Financial Resource Strain: Low Risk  (04/09/2021)   Received from [facility], [facility]   Overall Financial Resource Strain (CARDIA)    Difficulty of Paying Living Expenses: Not hard at all  Food Insecurity: No Food Insecurity (03/11/2023)   Hunger Vital Sign    Worried About Running Out of Food in the Last Year: Never true    Ran Out of Food in the Last Year: Never true  Transportation Needs: No Transportation Needs (03/11/2023)   PRAPARE - Administrator, Civil Service (Medical): No    Lack of Transportation (Non-Medical): No  Physical Activity: Insufficiently Active (04/09/2021)   Received from [facility], [facility]   Exercise Vital Sign    Days of Exercise per Week: 7 days    Minutes of Exercise per Session: 10 min  Stress: No Stress Concern Present (04/09/2021)   Received from [facility] of Occupational Health - Occupational Stress Questionnaire    Feeling of Stress : Not at all  Social Connections: Unknown (10/22/2021)   Received from [facility], [facility]   Social Network    Social Network: Not on file  Intimate Partner Violence: Not At Risk (03/11/2023)   Humiliation, Afraid, Rape, and Kick questionnaire    Fear of Current or Ex-Partner: No    Emotionally Abused: No    Physically Abused: No    Sexually Abused: No    Family History:    Family History  Problem Relation Age of Onset   Leukemia Mother 18       died   Lung cancer Father 16       died   Heart attack Brother 65   Heart attack Brother 52   Hypertension Brother        X3   Hypertension Sister        X2   Diabetes Sister    Stroke Sister    Diabetes Sister    Other Brother        Set designer accident     ROS:  Please see the  history of present illness.   All other ROS reviewed and negative.     Physical Exam/Data:   Vitals:   03/17/23 0115 03/17/23 0120 03/17/23 0125 03/17/23 0130  BP:   104/73   Pulse:      Resp: (!) 0 (!) 0 (!) 30 19  Temp:      TempSrc:      SpO2:      Weight:      Height:        Intake/Output Summary (Last 24 hours) at 03/17/2023 0136 Last data filed at 03/17/2023 0007 Gross per 24 hour  Intake 480 ml  Output 1450 ml  Net -970 ml      03/11/2023    7:09 AM 02/23/2023    8:40 AM 02/16/2023    4:05 AM  Last 3 Weights  Weight (lbs) 175 lb 176 lb 173 lb 4.8 oz  Weight (kg) 79.379 kg 79.833 kg 78.608 kg     Body mass index is 24.41 kg/m.  General: Appears older than stated age, in mild distress due to pain HEENT: normal Neck: no JVD Cardiac: Regular tachycardia, no murmurs Lungs:  clear to auscultation bilaterally, no wheezing, rhonchi or rales  Abd: soft, nontender, no hepatomegaly  Ext: Bilateral amputations Musculoskeletal: Bilateral amputations with  dressing over right amputation site Skin: warm and dry  Neuro:  CNs 2-12 intact, no focal abnormalities noted Psych:  Normal affect   EKG:  The EKG was personally reviewed and demonstrates: Atrial flutter with rapid ventricular response Telemetry:  Telemetry was personally reviewed and demonstrates: Heart rate 150 atrial flutter  Relevant CV Studies: Echo August 2024  1. Left ventricular ejection fraction, by estimation, is 70 to 75%. The  left ventricle has hyperdynamic function. The left ventricle has no  regional wall motion abnormalities. The left ventricular internal cavity  size was mildly dilated. There is mild  concentric left ventricular hypertrophy of the basal-septal segment. Left  ventricular diastolic parameters are consistent with Grade II diastolic  dysfunction (pseudonormalization). Elevated left ventricular end-diastolic  pressure.   2. Right ventricular systolic function is normal. The right  ventricular  size is normal.   3. Left atrial size was mildly dilated.   4. The mitral valve is normal in structure. Trivial mitral valve  regurgitation. No evidence of mitral stenosis.   5. The aortic valve is tricuspid. Aortic valve regurgitation is trivial.  Aortic valve sclerosis is present, with no evidence of aortic valve  stenosis.   6. The inferior vena cava is normal in size with greater than 50%  respiratory variability, suggesting right atrial pressure of 3 mmHg.   Laboratory Data:  High Sensitivity Troponin:  No results for input(s): "TROPONINIHS" in the last 720 hours.   Chemistry Recent Labs  Lab 03/11/23 0715 03/13/23 1040 03/16/23 1039  NA 135 133* 136  K 3.3* 3.7 3.6  CL 103 103 107  CO2 23 23 21*  GLUCOSE 65* 146* 153*  BUN 9 12 15   CREATININE 0.64 0.56* 0.55*  CALCIUM 8.2* 7.9* 7.8*  GFRNONAA >60 >60 >60  ANIONGAP 9 7 8     Recent Labs  Lab 03/11/23 0715 03/16/23 1039  PROT 7.0 6.0*  ALBUMIN 2.2* 1.8*  AST 26 21  ALT 22 14  ALKPHOS 115 67  BILITOT 0.3 0.5   Lipids No results for input(s): "CHOL", "TRIG", "HDL", "LABVLDL", "LDLCALC", "CHOLHDL" in the last 168 hours.  Hematology Recent Labs  Lab 03/13/23 0451 03/13/23 1040 03/16/23 1039  WBC 18.3* 17.5* 22.8*  RBC 3.22* 3.31* 3.06*  HGB 8.3* 8.5* 7.9*  HCT 27.1* 27.3* 25.1*  MCV 84.2 82.5 82.0  MCH 25.8* 25.7* 25.8*  MCHC 30.6 31.1 31.5  RDW 17.3* 17.2* 17.2*  PLT 359 369 304   Thyroid No results for input(s): "TSH", "FREET4" in the last 168 hours.  BNPNo results for input(s): "BNP", "PROBNP" in the last 168 hours.  DDimer No results for input(s): "DDIMER" in the last 168 hours.   Radiology/Studies:  No results found.   Assessment and Plan:   Atrial flutter with RVR-patient is currently asymptomatic with normotensive blood pressure.  Recommend TEE with cardioversion in the morning given interruption of anticoagulation for amputation surgery and softer blood pressures that will  preclude him from working with physical therapy and skilled nursing facility placement.   Risk Assessment/Risk Scores:          CHA2DS2-VASc Score = 4   This indicates a 4.8% annual risk of stroke. The patient's score is based upon: CHF History: 1 HTN History: 1 Diabetes History: 1 Stroke History: 0 Vascular Disease History: 1 Age Score: 0 Gender Score: 0         For questions or updates, please contact [facility] Please consult [email or web address] for contact info under  Signed, Roderic Palau, MD  03/17/2023 1:36 AM   Attending Attestation:  Patient seen and examined, note reviewed with the signed Resident Physician/Advanced Practice Provider. I personally reviewed laboratory data, imaging studies and relevant notes. I independently examined the patient and formulated the important aspects of the plan. I have personally discussed the plan with the patient and/or family. Comments or changes to the note/plan are indicated below.  My Exam:  General: Well nourished, well developed, in no acute distress Head: Atraumatic, normal size  Eyes: PEERLA, EOMI  Neck: Supple, no JVD Endocrine: No thryomegaly Cardiac: Normal S1, S2; tachycardia, no murmurs Lungs: Clear to auscultation bilaterally, no wheezing, rhonchi or rales  Abd: Soft, nontender, no hepatomegaly  Ext: L AKA, R BKA Skin: Warm and dry, no rashes   Neuro: Alert, awake, oriented to person place  Telemetry: Atrial flutter heart rate 154 EKG: Atrial flutter heart rate 146 Echo: LVEF 70 to 75% Cath: None  Assessment & Plan: 62 year old male with history of atrial flutter, PAD status post left AKA and recent right SFA stent, hypertension, diabetes, hyperlipidemia who was admitted on 03/11/2023 for right BKA.  Patient developed atrial flutter overnight on 03/16/2023.   # Recurrent atrial flutter -No symptoms.  Just admitted to the hospital in August for right foot osteomyelitis.  Course was complicated by  atrial flutter at that time.  He converted to normal sinus rhythm on a diltiazem drip.  There was concern for possible amiodarone pneumonitis.  This was stopped. -Now presents for right BKA.  Has developed atrial flutter again.  Blood pressure is soft which precludes AV nodal agents. -He has been on Eliquis for 3 to 4 days.  Given his low blood pressure and concern for amiodarone pneumonitis in the past we will proceed with transesophageal echo and cardioversion today.  He is NPO.  Risk and benefits explained.  I discussed this with the patient and wife by phone. -Continue Eliquis. -I would like to reevaluate his lungs with a chest x-ray.  Acute pulmonary pneumonitis is a bit uncommon.  We will see how his lungs look. -Plan for metoprolol succinate 25 mg daily after cardioversion.  # Amiodarone pneumonitis? -Repeat chest x-ray.  Would like him to be on some rhythm medication but limited options.  # HFpEF -Euvolemic on exam.  Continue home medications.  # PAD with recent R SFA stent -Completed 1 month of triple therapy.  Would recommend to continue Plavix and Eliquis.  I will reach out to surgery about this.  Currently on aspirin and Eliquis.  Given recent stent I believe vascular surgery want him on Plavix and Eliquis for 3 to 6 months.  Likely after 6 months can just go to aspirin and Eliquis.  # DM - per primary team   # R foot osteomyelitis  -Abx per ortho   Signed, Gerri Spore T. Flora Lipps, MD [facility]  [facility]  03/17/2023 8:23 AM

## 2023-03-17 NOTE — Anesthesia Preprocedure Evaluation (Addendum)
Anesthesia Evaluation  Patient identified by MRN, date of birth, ID band Patient awake    Reviewed: Allergy & Precautions, NPO status , Patient's Chart, lab work & pertinent test results  History of Anesthesia Complications Negative for: history of anesthetic complications  Airway Mallampati: II  TM Distance: >3 FB Neck ROM: Full    Dental  (+) Dental Advisory Given   Pulmonary COPD,  COPD inhaler, Current Smoker and Patient abstained from smoking.   Pulmonary exam normal        Cardiovascular hypertension, + Peripheral Vascular Disease  Normal cardiovascular exam+ dysrhythmias Atrial Fibrillation    '24 TTE - EF 70 to 75%. The left ventricular internal cavity size was mildly dilated. There is mild concentric left ventricular hypertrophy of the basal-septal segment. Grade II diastolic dysfunction (pseudonormalization). Left atrial size was mildly dilated. Trivial mitral valve regurgitation. Aortic valve regurgitation is trivial.     Neuro/Psych  PSYCHIATRIC DISORDERS Anxiety Depression    negative neurological ROS     GI/Hepatic ,GERD  Medicated and Controlled,,(+) Cirrhosis         Endo/Other  diabetes, Type 2, Oral Hypoglycemic Agents, Insulin Dependent    Renal/GU negative Renal ROS     Musculoskeletal negative musculoskeletal ROS (+)    Abdominal   Peds  Hematology  (+) Blood dyscrasia, anemia  On Plavix    Anesthesia Other Findings   Reproductive/Obstetrics                             Anesthesia Physical Anesthesia Plan  ASA: 3  Anesthesia Plan: MAC and General   Post-op Pain Management: Minimal or no pain anticipated   Induction:   PONV Risk Score and Plan: 1 and Propofol infusion and Treatment may vary due to age or medical condition  Airway Management Planned: Nasal Cannula and Natural Airway  Additional Equipment: None  Intra-op Plan:   Post-operative Plan:    Informed Consent: I have reviewed the patients History and Physical, chart, labs and discussed the procedure including the risks, benefits and alternatives for the proposed anesthesia with the patient or authorized representative who has indicated his/her understanding and acceptance.       Plan Discussed with: CRNA and Anesthesiologist  Anesthesia Plan Comments: (May begin procedure as MAC with conversion to GA as indicated by procedure )       Anesthesia Quick Evaluation

## 2023-03-17 NOTE — Progress Notes (Addendum)
Notified by nurse that HR back up to 140s-150s. Tele shows recurrent atrial flutter. EKG confirms this, similar morphology to prior tracings, no acute concerns otherwise. Patient is asymptomatic but BP remains soft the way it had been this admission. D/w Dr. Flora Lipps. CXR clear, no concern for interstitial changes or pulmonary disease - limited options given BP - he recommends IV amiodarone bolus and drip. D/c'd PRN IV metoprolol as we do not suspect this will impact HR if given, given that atrial flutter can be challenging to rate control. We will transfer him to a progressive floor that can handle drips. Dr. Flora Lipps also recommends d/c of ASA and starting Plavix in AM.  Will also order f/u lytes. Calcium was low on AM labs but suspect pseudohypocalcemia in setting of low albumin. Will s/o to on call fellow to review when labs back.

## 2023-03-17 NOTE — Progress Notes (Signed)
OT Cancellation Note  Patient Details Name: Troy [name] MRN: [id number] DOB: 23-Jan-1961   Cancelled Treatment:    Reason Eval/Treat Not Completed: (P) Pain limiting ability to participate,Pt states 8/10 pain, did not want to perform therapy today, will re-attempt tomorrow.   Alexis Goodell 03/17/2023, 4:39 PM

## 2023-03-17 NOTE — Progress Notes (Addendum)
Received signout from early APP who spoke with cardiology fellow overnight, possible TEE DCCV today. Note outlines long hx of atrial flutter but I can only find reports of diagnosis last month. Last CBC was yesterday showing rising WBC, declining Hgb. D/w Dr. Flora Lipps who will evaluate patient this AM for final plans. Will keep NPO. Cancel discharge order. Change basal insulin to half dose. Further recs pending eval this AM. Addendum: Dr. Flora Lipps discussed with Dr. Lajoyce Corners and recommends to proceed. See his addendum to consult note earlier this morning. Orders written. MD consented patient for procedure.

## 2023-03-17 NOTE — CV Procedure (Signed)
TEE/CARDIOVERSION NOTE  TRANSESOPHAGEAL ECHOCARDIOGRAM (TEE):  Indictation: Atrial Flutter  Consent:   Informed consent was obtained prior to the procedure. The risks, benefits and alternatives for the procedure were discussed and the patient comprehended these risks.  Risks include, but are not limited to, cough, sore throat, vomiting, nausea, somnolence, esophageal and stomach trauma or perforation, bleeding, low blood pressure, aspiration, pneumonia, infection, trauma to the teeth and death.    Time Out: Verified patient identification, verified procedure, site/side was marked, verified correct patient position, special equipment/implants available, medications/allergies/relevent history reviewed, required imaging and test results available. Performed  Procedure:  After a procedural time-out, the patient was given propofol per anesthesia for sedation. See their separate report for details. The patient's heart rate, blood pressure, and oxygen saturation are monitored continuously during the procedure. The oropharynx was anesthetized with topical cetacaine.  The transesophageal probe was inserted in the esophagus and stomach without difficulty and multiple views were obtained. Agitated microbubble saline contrast was not administered.  Complications:    Complications: None Patient did tolerate procedure well.  Findings:  LEFT VENTRICLE: The left ventricular wall thickness is moderately increased.  The left ventricular cavity is small in size. Wall motion is hyperdynamic.  LVEF is 70-75%.  RIGHT VENTRICLE:  The right ventricle is normal in structure and function without any thrombus or masses.    LEFT ATRIUM:  The left atrium is mildly dilated in size without any thrombus or masses.  There is not spontaneous echo contrast ("smoke") in the left atrium consistent with a low flow state.  LEFT ATRIAL APPENDAGE:  The left atrial appendage is free of any thrombus or masses. The  appendage has single lobes. Pulse doppler indicates moderate flow in the appendage.  ATRIAL SEPTUM:  The atrial septum appears intact and is free of thrombus and/or masses.  There is no evidence for interatrial shunting by color doppler.  RIGHT ATRIUM:  The right atrium is normal in size and function without any thrombus or masses.  MITRAL VALVE:  The mitral valve is normal in structure and function with  trivial  regurgitation.  There were no vegetations or stenosis.  AORTIC VALVE:  The aortic valve is trileaflet, normal in structure and function with  no  regurgitation.  There were no vegetations or stenosis  TRICUSPID VALVE:  The tricuspid valve is normal in structure and function with  trivial  regurgitation.  There were no vegetations or stenosis   PULMONIC VALVE:  The pulmonic valve is normal in structure and function with  no  regurgitation.  There were no vegetations or stenosis.   AORTIC ARCH, ASCENDING AND DESCENDING AORTA:  There was no Myrtis Ser et. Al, 1992) atherosclerosis of the ascending aorta, aortic arch, or proximal descending aorta.  12. PULMONARY VEINS: Anomalous pulmonary venous return was not noted.  13. PERICARDIUM: The pericardium appeared normal and non-thickened.  There is a trivial pericardial effusion.  CARDIOVERSION:     Second Time Out: Verified patient identification, verified procedure, site/side was marked, verified correct patient position, special equipment/implants available, medications/allergies/relevent history reviewed, required imaging and test results available.  Performed  Procedure:  Patient placed on cardiac monitor, pulse oximetry, supplemental oxygen as necessary.  Sedation administered per anesthesia Pacer pads placed anterior and posterior chest. Cardioverted 1 time(s).  Cardioverted at 120J biphasic. Successful DCCV to NSR with rare PAC's and PVC's.  Complications:  Complications: None Patient did tolerate procedure  well.  Impression:  No LAA thrombus Negative for PFO Moderate LVH  LVEF 70-75% Trivial pericardial effusion Successful DCCV with a single 120J biphasic shock to NSR  Recommendations:   Management recommendations per inpatient cardiology service.  Time Spent Directly with the Patient:  45 minutes   Chrystie Nose, MD, [facility], FACP  [place]  [facility]  Medical Director of the [facility] &  Cardiovascular Risk Reduction Clinic Diplomate of the American Board of Clinical Lipidology Attending Cardiologist  Direct Dial: [phone]  Fax: [phone]  Website:  [email or web address] Nardos Putnam 03/17/2023, 11:05 AM

## 2023-03-18 ENCOUNTER — Encounter (HOSPITAL_COMMUNITY): Payer: Self-pay | Admitting: Internal Medicine

## 2023-03-18 DIAGNOSIS — I483 Typical atrial flutter: Secondary | ICD-10-CM | POA: Diagnosis not present

## 2023-03-18 LAB — COMPREHENSIVE METABOLIC PANEL
ALT: 19 U/L (ref 0–44)
AST: 25 U/L (ref 15–41)
Albumin: 1.9 g/dL — ABNORMAL LOW (ref 3.5–5.0)
Alkaline Phosphatase: 71 U/L (ref 38–126)
Anion gap: 8 (ref 5–15)
BUN: 21 mg/dL (ref 8–23)
CO2: 20 mmol/L — ABNORMAL LOW (ref 22–32)
Calcium: 7.7 mg/dL — ABNORMAL LOW (ref 8.9–10.3)
Chloride: 107 mmol/L (ref 98–111)
Creatinine, Ser: 0.82 mg/dL (ref 0.61–1.24)
GFR, Estimated: >60 mL/min (ref >60)
Glucose, Bld: 114 mg/dL — ABNORMAL HIGH (ref 70–99)
Potassium: 4 mmol/L (ref 3.5–5.1)
Sodium: 135 mmol/L (ref 135–145)
Total Bilirubin: 0.6 mg/dL (ref 0.3–1.2)
Total Protein: 6 g/dL — ABNORMAL LOW (ref 6.5–8.1)

## 2023-03-18 LAB — CBC
HCT: 28.1 % — ABNORMAL LOW (ref 39.0–52.0)
Hemoglobin: 8.7 g/dL — ABNORMAL LOW (ref 13.0–17.0)
MCH: 26.3 pg (ref 26.0–34.0)
MCHC: 31 g/dL (ref 30.0–36.0)
MCV: 84.9 fL (ref 80.0–100.0)
Platelets: 327 10*3/uL (ref 150–400)
RBC: 3.31 MIL/uL — ABNORMAL LOW (ref 4.22–5.81)
RDW: 18.5 % — ABNORMAL HIGH (ref 11.5–15.5)
WBC: 20.2 10*3/uL — ABNORMAL HIGH (ref 4.0–10.5)
nRBC: 0 % (ref 0.0–0.2)

## 2023-03-18 LAB — GLUCOSE, CAPILLARY
Glucose-Capillary: 157 mg/dL — ABNORMAL HIGH (ref 70–99)
Glucose-Capillary: 70 mg/dL (ref 70–99)
Glucose-Capillary: 141 mg/dL — ABNORMAL HIGH (ref 70–99)
Glucose-Capillary: 80 mg/dL (ref 70–99)

## 2023-03-18 LAB — MAGNESIUM: Magnesium: 1.7 mg/dL (ref 1.7–2.4)

## 2023-03-18 MED ORDER — DOXYCYCLINE HYCLATE 100 MG PO TABS
100.0000 mg | ORAL_TABLET | Freq: Two times a day (BID) | ORAL | Status: DC
  Administered 2023-03-18 – 2023-03-24 (×12): 100 mg via ORAL
  Filled 2023-03-18 (×12): qty 1

## 2023-03-18 NOTE — Plan of Care (Signed)
  Problem: Education: Goal: Knowledge of the prescribed therapeutic regimen will improve Outcome: Progressing Goal: Ability to verbalize activity precautions or restrictions will improve Outcome: Progressing Goal: Understanding of discharge needs will improve Outcome: Progressing   

## 2023-03-18 NOTE — Progress Notes (Signed)
Patient ID: Troy [name], male   DOB: 12/08/1960, 62 y.o.   MRN: [id number] Patient is seen in follow-up status post right below-knee amputation.  There is no drainage in the wound VAC canister there is a good suction fit.  Patient's heart rate is 130.  He is starting develop sacral ulcers from immobilization.  Discussed the importance with the patient to turn side-to-side every 2 hours.  Patient may benefit from a Mepilex sacral dressing.  Discharge to skilled nursing underway pending resolution of his cardiac condition.

## 2023-03-18 NOTE — TOC Initial Note (Signed)
Transition of Care [facility]) - Initial/Assessment Note    Patient Details  Name: Troy [name] MRN: [id number] Date of Birth: 09/21/1960  [facility] CM/SW Contact:    Delilah Shan, LCSWA Phone Number: 03/18/2023, 1:32 PM  Clinical Narrative:                  CSW received consult for possible SNF placement at time of discharge. . Patient expressed understanding of PT recommendation and is agreeable to SNF placement at time of discharge. Patient reports preference for [facility] as first choice and [address] as second choice . CSW discussed insurance authorization process.No further questions reported at this time. CSW to continue to follow and assist with discharge planning needs.   Expected Discharge Plan: Skilled Nursing Facility Barriers to Discharge: Other (must enter comment) (Pt not working with PT, pt wants to go home, wife refusing to receive pt)   Patient Goals and CMS Choice Patient states their goals for this hospitalization and ongoing recovery are:: patient is agreeable to SNF CMS Medicare.gov Compare Post Acute Care list provided to:: Patient Choice offered to / list presented to : Patient, Spouse      Expected Discharge Plan and Services In-house Referral: Clinical Social Work Discharge Planning Services: CM Consult Post Acute Care Choice: Skilled Nursing Facility Living arrangements for the past 2 months: Single Family Home Expected Discharge Date: 03/15/23                                    Prior Living Arrangements/Services Living arrangements for the past 2 months: Single Family Home Lives with:: Spouse                   Activities of Daily Living Home Assistive Devices/Equipment: Eyeglasses, Wheelchair, Prosthesis, CBG Meter ADL Screening (condition at time of admission) Does the patient have a NEW difficulty with bathing/dressing/toileting/self-feeding that is expected to last >3 days?: Yes (Initiates electronic notice to  provider for possible OT consult) Does the patient have a NEW difficulty with getting in/out of bed, walking, or climbing stairs that is expected to last >3 days?: Yes (Initiates electronic notice to provider for possible PT consult) Does the patient have a NEW difficulty with communication that is expected to last >3 days?: No Is the patient deaf or have difficulty hearing?: No Does the patient have difficulty seeing, even when wearing glasses/contacts?: No Does the patient have difficulty concentrating, remembering, or making decisions?: No  Permission Sought/Granted                  Emotional Assessment              Admission diagnosis:  Below-knee amputation of right lower extremity (HCC) [A21.308M] Patient Active Problem List   Diagnosis Date Noted   Osteomyelitis of ankle or foot, acute, right (HCC) 03/11/2023   Abscess of right lower leg 03/11/2023   Below-knee amputation of right lower extremity (HCC) 03/11/2023   Chronic respiratory failure with hypoxia (HCC) 02/12/2023   Pulmonary infiltrates 02/12/2023   Acute on chronic heart failure with preserved ejection fraction (HFpEF) (HCC) 02/12/2023   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 08/22/2021   Diabetic acidosis  without coma (HCC)    Hypokalemia    Hypomagnesemia    Hyponatremia    Pneumonia due to infectious organism    Parapneumonic effusion 08/18/2021   Bowel Ileus (HCC) 08/18/2021   ?? 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   Ketoacidosis due to type 2 diabetes mellitus (HCC) 08/15/2021   COVID-19 virus infection 08/15/2021   S/P AKA (above knee amputation), left (HCC) 02/12/2021   Amputation of right great toe and 2nd Toe 02/12/2021   Sepsis due to left-sided neck  cellulitis/MRSA 02/12/2021   Hypotensive episode    Elevated troponin    SIRS (systemic inflammatory response syndrome) (HCC) 02/11/2021   Abscess of bursa of left ankle 01/21/2021   Dehiscence of amputation stump (HCC)    Ischemia of left BKA site (HCC)    Abscess of leg without foot, left    Osteomyelitis of second toe of right foot (HCC) 07/12/2019   Below knee amputation status, left 01/25/2018   Wound dehiscence, surgical    Status post left foot surgery 12/15/2017   GERD (gastroesophageal reflux disease) 09/26/2017   Hyperlipidemia associated with type 2 diabetes mellitus (HCC) 07/08/2017   Snoring 06/07/2016   Chest tube in place 01/22/2016   Abnormal nuclear stress test 01/22/2016   Pain, chronic due to trauma 07/04/2012   Complex regional pain syndrome I of lower limb 07/04/2012   COPD (chronic obstructive pulmonary disease) (HCC) 01/27/2011   Pre-operative cardiovascular examination 01/27/2011   Nonspecific abnormal electrocardiogram (ECG) (EKG) 01/27/2011   Murmur 01/27/2011   DM2 (diabetes mellitus, type 2) (HCC)    HTN (hypertension)    HLD (hyperlipidemia)    Tobacco abuse    PCP:  Pcp, No Pharmacy:   [id number] [place] [place] - [address] [place] [address] Phone: [phone] Fax: [phone]     Social Determinants of Health (SDOH) Social History: SDOH Screenings   Food Insecurity: No Food Insecurity (03/11/2023)  Housing: Low Risk  (03/11/2023)  Transportation Needs: No Transportation Needs (03/11/2023)  Utilities: Not At Risk (03/11/2023)  Financial Resource Strain: Low Risk  (04/09/2021)   Received from [facility], [facility]  Physical Activity: Insufficiently Active (04/09/2021)   Received from [facility], [facility]  Social Connections: Unknown (10/22/2021)   Received from [facility], [facility]  Stress: No Stress Concern Present (04/09/2021)   Received from [facility], [facility]   Tobacco Use: High Risk (03/11/2023)   SDOH Interventions:     Readmission Risk Interventions    08/25/2021   12:46 PM  Readmission Risk Prevention Plan  Transportation Screening Complete  Medication Review (RN Care Manager) Complete  PCP or Specialist appointment within 3-5 days of discharge Complete  HRI or Home Care Consult Complete  SW Recovery Care/Counseling Consult Complete  Palliative Care Screening Not Applicable  Skilled Nursing Facility Complete

## 2023-03-18 NOTE — [place] FL2 (Signed)
[place] MEDICAID FL2 LEVEL OF CARE FORM     IDENTIFICATION  Patient Name: Troy [name] Birthdate: 1960/09/22 Sex: male Admission Date (Current Location): 03/11/2023  [facility] and [place] Number:  Producer, television/film/video and Address:  [facility] [place]. [facility], [address] [place], [place] [address]      Provider Number: [id number]  Attending Physician Name and Address:  Nadara Mustard, MD  Relative Name and Phone Number:  [name] (Spouse) [phone]    Current Level of Care: Hospital Recommended Level of Care: Skilled Nursing Facility Prior Approval Number:    Date Approved/Denied:   PASRR Number: [id number]  Discharge Plan: SNF    Current Diagnoses: Patient Active Problem List   Diagnosis Date Noted   Osteomyelitis of ankle or foot, acute, right (HCC) 03/11/2023   Abscess of right lower leg 03/11/2023   Below-knee amputation of right lower extremity (HCC) 03/11/2023   Chronic respiratory failure with hypoxia (HCC) 02/12/2023   Pulmonary infiltrates 02/12/2023   Acute on chronic heart failure with preserved ejection fraction (HFpEF) (HCC) 02/12/2023   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 08/22/2021   Diabetic acidosis without coma (HCC)    Hypokalemia    Hypomagnesemia    Hyponatremia    Pneumonia due to infectious organism    Parapneumonic effusion 08/18/2021   Bowel Ileus (HCC) 08/18/2021   ?? 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   Ketoacidosis due to type 2 diabetes mellitus (HCC) 08/15/2021   COVID-19 virus infection 08/15/2021   S/P AKA (above knee amputation), left (HCC) 02/12/2021   Amputation of right great toe and 2nd Toe 02/12/2021   Sepsis due to left-sided neck cellulitis/MRSA 02/12/2021   Hypotensive  episode    Elevated troponin    SIRS (systemic inflammatory response syndrome) (HCC) 02/11/2021   Abscess of bursa of left ankle 01/21/2021   Dehiscence of amputation stump (HCC)    Ischemia of left BKA site (HCC)    Abscess of leg without foot, left    Osteomyelitis of second toe of right foot (HCC) 07/12/2019   Below knee amputation status, left 01/25/2018   Wound dehiscence, surgical    Status post left foot surgery 12/15/2017   GERD (gastroesophageal reflux disease) 09/26/2017   Hyperlipidemia associated with type 2 diabetes mellitus (HCC) 07/08/2017   Snoring 06/07/2016   Chest tube in place 01/22/2016   Abnormal nuclear stress test 01/22/2016   Pain, chronic due to trauma 07/04/2012   Complex regional pain syndrome I of lower limb 07/04/2012   COPD (chronic obstructive pulmonary disease) (HCC) 01/27/2011   Pre-operative cardiovascular examination 01/27/2011   Nonspecific abnormal electrocardiogram (ECG) (EKG) 01/27/2011   Murmur 01/27/2011   DM2 (diabetes mellitus, type 2) (HCC)    HTN (hypertension)    HLD (hyperlipidemia)    Tobacco abuse     Orientation RESPIRATION BLADDER Height & Weight     Self, Place, Situation  Normal Continent Weight: 175 lb (79.4 kg) Height:  5\' 11"  (180.3 cm)  BEHAVIORAL SYMPTOMS/MOOD NEUROLOGICAL BOWEL NUTRITION STATUS      Continent Diet (Please see discharge summary)  AMBULATORY STATUS COMMUNICATION OF NEEDS Skin   Limited Assist Verbally Other (Comment) (Abrasion,back,R,Ecchymosis,Groin,L,Erythema,Buttocks,Bil.,Rash,Buttocks,Bil.,Wound/Incision LDAs,Incision Closed,Leg,Other,Neg.pressure wound therapy,Knee,Anterior,R)  Personal Care Assistance Level of Assistance  Bathing, Feeding, Dressing Bathing Assistance: Limited assistance Feeding assistance: Limited assistance Dressing Assistance: Limited assistance     Functional Limitations Info  Sight, Hearing, Speech Sight Info: Impaired Hearing Info:  Adequate Speech Info: Adequate    SPECIAL CARE FACTORS FREQUENCY  PT (By licensed PT), OT (By licensed OT)     PT Frequency: 5x min weekly OT Frequency: 5x min weekly            Contractures Contractures Info: Not present    Additional Factors Info  Code Status, Allergies, Insulin Sliding Scale, Isolation Precautions Code Status Info: FULL Allergies Info: Hydromorphone Hcl Er,Tapentadol,Exalamide   Insulin Sliding Scale Info: insulin aspart (novoLOG) injection 0-15 Units 3 times daily with meals,insulin aspart (novoLOG) injection 4 Units 3 times daily with meals,insulin glargine-yfgn (SEMGLEE) injection 20 Units daily Isolation Precautions Info: MRSA onset date 03/11/23,Added 03/14/23     Current Medications (03/18/2023):  This is the current hospital active medication list Current Facility-Administered Medications  Medication Dose Route Frequency Provider Last Rate Last Admin   0.9 %  sodium chloride infusion   Intravenous Continuous Dunn, Dayna N, PA-C 75 mL/hr at 03/18/23 1134 New Bag at 03/18/23 1134   acetaminophen (TYLENOL) tablet 325-650 mg  325-650 mg Oral Q6H PRN Dunn, Dayna N, PA-C       alum & mag hydroxide-simeth (MAALOX/MYLANTA) 200-200-20 MG/5ML suspension 15-30 mL  15-30 mL Oral Q2H PRN Dunn, Dayna N, PA-C       amiodarone (NEXTERONE PREMIX) 360-4.14 MG/200ML-% (1.8 mg/mL) IV infusion  30 mg/hr Intravenous Continuous Dunn, Dayna N, PA-C 16.67 mL/hr at 03/18/23 0802 30 mg/hr at 03/18/23 0802   apixaban (ELIQUIS) tablet 5 mg  5 mg Oral BID Ronie Spies N, PA-C   5 mg at 03/18/23 1610   ascorbic acid (VITAMIN C) tablet 1,000 mg  1,000 mg Oral Daily Dunn, Dayna N, PA-C   1,000 mg at 03/18/23 9604   bisacodyl (DULCOLAX) EC tablet 5 mg  5 mg Oral Daily PRN Dunn, Dayna N, PA-C       clopidogrel (PLAVIX) tablet 75 mg  75 mg Oral Daily Dunn, Dayna N, PA-C   75 mg at 03/18/23 5409   docusate sodium (COLACE) capsule 100 mg  100 mg Oral Daily Dunn, Dayna N, PA-C   100 mg at  03/18/23 8119   doxycycline (VIBRA-TABS) tablet 100 mg  100 mg Oral Q12H Nadara Mustard, MD       empagliflozin ([name]) tablet 10 mg  10 mg Oral QAC breakfast Dunn, Dayna N, PA-C   10 mg at 03/18/23 1478   feeding supplement (ENSURE ENLIVE / ENSURE PLUS) liquid 237 mL  237 mL Oral BID BM Dunn, Dayna N, PA-C   237 mL at 03/16/23 0833   glipiZIDE (GLUCOTROL) tablet 10 mg  10 mg Oral BID Dunn, Dayna N, PA-C   10 mg at 03/18/23 0823   guaiFENesin-dextromethorphan (ROBITUSSIN DM) 100-10 MG/5ML syrup 15 mL  15 mL Oral Q4H PRN Dunn, Dayna N, PA-C       HYDROmorphone (DILAUDID) injection 0.5-1 mg  0.5-1 mg Intravenous Q4H PRN Dunn, Dayna N, PA-C   1 mg at 03/17/23 1945   insulin aspart (novoLOG) injection 0-15 Units  0-15 Units Subcutaneous TID WC Dunn, Dayna N, PA-C   3 Units at 03/18/23 0829   insulin aspart (novoLOG) injection 4 Units  4 Units Subcutaneous TID WC Dunn, Dayna N, PA-C   4 Units at 03/18/23 0830  insulin glargine-yfgn (SEMGLEE) injection 20 Units  20 Units Subcutaneous Daily Laurann Montana, PA-C   20 Units at 03/18/23 5366   magnesium citrate solution 1 Bottle  1 Bottle Oral Once PRN Dunn, Dayna N, PA-C       magnesium sulfate IVPB 2 g 50 mL  2 g Intravenous Daily PRN Dunn, Dayna N, PA-C       metoprolol succinate (TOPROL-XL) 24 hr tablet 25 mg  25 mg Oral Daily Dunn, Dayna N, PA-C   25 mg at 03/18/23 4403   nutrition supplement (JUVEN) (JUVEN) powder packet 1 packet  1 packet Oral BID BM Dunn, Tacey Ruiz, PA-C   1 packet at 03/16/23 0832   ondansetron (ZOFRAN) injection 4 mg  4 mg Intravenous Q6H PRN Dunn, Dayna N, PA-C       oxyCODONE (Oxy IR/ROXICODONE) immediate release tablet 10-15 mg  10-15 mg Oral Q4H PRN Ronie Spies N, PA-C   15 mg at 03/18/23 4742   oxyCODONE (Oxy IR/ROXICODONE) immediate release tablet 5-10 mg  5-10 mg Oral Q4H PRN Ronie Spies N, PA-C   10 mg at 03/17/23 0808   pantoprazole (PROTONIX) EC tablet 40 mg  40 mg Oral Daily Dunn, Dayna N, PA-C   40 mg at 03/18/23 5956    phenol (CHLORASEPTIC) mouth spray 1 spray  1 spray Mouth/Throat PRN Dunn, Dayna N, PA-C       polyethylene glycol (MIRALAX / GLYCOLAX) packet 17 g  17 g Oral Daily PRN Dunn, Dayna N, PA-C       potassium chloride SA (KLOR-CON M) CR tablet 20-40 mEq  20-40 mEq Oral Daily PRN Dunn, Dayna N, PA-C       rosuvastatin (CRESTOR) tablet 20 mg  20 mg Oral Daily Dunn, Dayna N, PA-C   20 mg at 03/18/23 3875   zinc sulfate capsule 220 mg  220 mg Oral Daily Dunn, Dayna N, PA-C   220 mg at 03/18/23 6433     Discharge Medications: Please see discharge summary for a list of discharge medications.  Relevant Imaging Results:  Relevant Lab Results:   Additional Information [id number]  Delilah Shan, LCSWA

## 2023-03-18 NOTE — Progress Notes (Signed)
Physical Therapy Treatment Patient Details Name: Troy [name] MRN: [id number] DOB: 1961/04/09 Today's Date: 03/18/2023   History of Present Illness Pt is a 62 yo male presenting with dehiscence of R transmet amputation site. 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 S/p TEE and cardioversion for aflutter on 9/26, back in aflutter as of 9/27 with plan for another cardioversion attempt on 9/30. PMH: CHF, COPD, DM2, HTN, restless leg syndrome, PTSD, RLE trans met amputation, L AKA    PT Comments  Pt reporting moderate pain of RLE and back/buttocks, breakdown noted around buttocks and RN aware. Pt requiring gentle encouragement to participate in PT given pain. Pt tolerated EOB sitting x5 minutes, cannot tolerate scooting this date given imbalance and fatigue. Pt tolerated limited BKA exercises, states he will continue to perform them on his own with his wife's assist. PT also encouraged frequent pressure relief of buttocks, pt left in R sidelying. Pt's activity tolerance has worsened, and increased weakness appreciated at this time. Patient will benefit from continued inpatient follow up therapy, <3 hours/day, once d/c from acute setting. PT to continue to follow and progress as able.    If plan is discharge home, recommend the following: A little help with walking and/or transfers;Assistance with cooking/housework   Can travel by private vehicle        Equipment Recommendations  None recommended by PT    Recommendations for Other Services       Precautions / Restrictions Precautions Precautions: Fall Precaution Comments: VAC R BKA Required Braces or Orthoses: Other Brace Other Brace: R residual limb protector     Mobility  Bed Mobility Overal bed mobility: Needs Assistance Bed Mobility: Supine to Sit, Sit to Supine     Supine to sit: Min assist Sit to supine: Min assist   General bed mobility comments: assist for completion of trunk elevation, repositioning and boost up upon return to supine     Transfers                   General transfer comment: pt declines OOB, EOB only    Ambulation/Gait                   Stairs             Wheelchair Mobility     Tilt Bed    Modified Rankin (Stroke Patients Only)       Balance Overall balance assessment: Needs assistance Sitting-balance support: Bilateral upper extremity supported Sitting balance-Leahy Scale: Fair Sitting balance - Comments: can sit EOB with bilat UE support, fatigues quickly and cannot tolerate scooting this date given imbalance and fatigue                                    Cognition Arousal: Alert Behavior During Therapy: Flat affect Overall Cognitive Status: Within Functional Limits for tasks assessed                                          Exercises General Exercises - Lower Extremity Quad Sets: AROM, Right, 5 reps, Supine Gluteal Sets: AROM, Both, 5 reps, Supine Straight Leg Raises: AROM, Right, 5 reps, Supine    General Comments        Pertinent Vitals/Pain Pain Assessment Pain Assessment: Faces Faces Pain Scale: Hurts little more Pain Location: R  leg, back, buttocks Pain Descriptors / Indicators: Guarding, Grimacing, Discomfort Pain Intervention(s): Monitored during session, Repositioned, Limited activity within patient's tolerance    Home Living                          Prior Function            PT Goals (current goals can now be found in the care plan section) Acute Rehab PT Goals Patient Stated Goal: Return home with spouse PT Goal Formulation: With patient Time For Goal Achievement: 03/26/23 Potential to Achieve Goals: Good Progress towards PT goals: Progressing toward goals    Frequency    Min 1X/week      PT Plan      Co-evaluation              AM-PAC PT "6 Clicks" Mobility   Outcome Measure  Help needed turning from your back to your side while in a flat bed without using bedrails?:  A Little Help needed moving from lying on your back to sitting on the side of a flat bed without using bedrails?: A Little Help needed moving to and from a bed to a chair (including a wheelchair)?: A Little Help needed standing up from a chair using your arms (e.g., wheelchair or bedside chair)?: A Little Help needed to walk in hospital room?: Total Help needed climbing 3-5 steps with a railing? : Total 6 Click Score: 14    End of Session   Activity Tolerance: Patient limited by pain;Patient limited by fatigue Patient left: in bed;with call bell/phone within reach;with bed alarm set;with nursing/sitter in room Nurse Communication: Mobility status;Other (comment) (L iv appears partially dislodged upon arrival to room) PT Visit Diagnosis: Other abnormalities of gait and mobility (R26.89)     Time: 1152-1208 PT Time Calculation (min) (ACUTE ONLY): 16 min  Charges:    $Therapeutic Activity: 8-22 mins PT General Charges $$ ACUTE PT VISIT: 1 Visit                     Marye Round, PT DPT Acute Rehabilitation Services Secure Chat Preferred  Office [phone]    Troy [name] 03/18/2023, 1:27 PM
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
## 2023-03-18 NOTE — Consult Note (Signed)
ELECTROPHYSIOLOGY CONSULT NOTE    Patient ID: Troy [name] MRN: [id number], DOB/AGE: 1961-04-28 62 y.o.  Admit date: 03/11/2023 Date of Consult: 03/18/2023  Primary Physician: Pcp, No Primary Cardiologist: None  Electrophysiologist: New   Referring Provider: Dr. Flora Lipps  Patient Profile: Troy [name] is a 62 y.o. male with a history of PAD status post right SFA stent, type 2 diabetes, hypertension, hyperlipidemia, left lower extremity above-the-knee amputation who was admitted for right BKA  who is being seen today for the evaluation of Atrial flutter at the request of Dr. Flora Lipps.  HPI:  Troy [name] was admitted on September 20 with right transmetatarsal wound dehiscence and had a subsequent right BKA with Dr. Aldean Baker.  His course has been complicated by atrial flutter with rapid ventricular response up to heart rate of 150.  Today he received a unit of packed red blood cells and fluid resuscitation for soft blood pressures.  He was then treated with IV metoprolol with slight improvement in his heart rate but heart rate has increased again to 150 bpm   Pt reported a prior history of atrial flutter with poor rhythm awareness.  Denies pre-syncope, schest pain, DOE, or palpitations currently.   Pt underwent TEE/DCC 9/26 to NSR.  Echo showed:  LVEF 70-75%, normal RV, Mild LAE, normal RA, Trivial MR, Trivial TR.   Unfortunately, reverted back to AFL just after 1715.   EP asked to see for further recommendations and consideration of ablation.   He is feeling OK at rest this am. Wants to go home, but understands he'll need to stay for med loading/adjustment and another cardioversion.   Labs Potassium3.7 (09/26 0500)   Creatinine, ser  0.66 (09/26 0500) PLT  354 (09/26 0812) HGB  9.2* (09/26 0812) WBC 18.4* (09/26 6295)  .    Allergies, Medical, Surgical, Social, and Family Histories have been reviewed and are referenced here-in when relevant for medical decision making.     Physical Exam: Vitals:   03/17/23 2335 03/18/23 0218 03/18/23 0420 03/18/23 0733  BP: 112/84 103/66 115/82 123/86  Pulse: (!) 129 (!) 129 (!) 127 (!) 128  Resp: 18 18 16 16   Temp:   98.2 F (36.8 C) 98.1 F (36.7 C)  TempSrc:   Oral Oral  SpO2: 96% 96% 94% 96%  Weight:      Height:        GEN- NAD, A&O x 3, normal affect HEENT: Normocephalic, atraumatic Lungs- CTAB, Normal effort.  Heart- Regular rate and rhythm, No M/G/R.  GI- Soft, NT, ND.  Extremities- No clubbing, cyanosis, or edema   Radiology/Studies:   TEE/DCC 9/26 to NSR.  Echo showed:  LVEF 70-75%, normal RV, Mild LAE, normal RA, Trivial MR, Trivial TR.   EKG: EKG yesterday evening showed Typical AFL at 142 bpm  (personally reviewed)  Sinus EKG showed NSR at 82 bpm with a PVC, PR interval 152 ms, QRS 76 ms  TELEMETRY: AFL in 120-130s overnight, reverted from NSR just after 1700 (personally reviewed)  Assessment/Plan:  Typical atrial flutter Recurrent after TEE/DCC yesterday am.  Ultimately, will likely benefit from ablation With recent amputation, would recommend continuing to load with IV amiodarone for full load and cardioverting again early next week, then would follow up as outpatient to discuss ablation once he has healed.  Continue Eliquis for CHA2DS2/VASc of at least 3. Continue IV amiodarone Can adjust rate control as needed.   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  ABx per primary.  HFpEF Volume status OK EF normal to hyperdynamic as above.   DM2 Per primary.   For questions or updates, please contact [facility] Please consult [email or web address] for contact info under Cardiology/STEMI.  Troy Flock, PA-C  03/18/2023 8:55 AM

## 2023-03-18 NOTE — Progress Notes (Signed)
Cardiology Progress Note  Patient ID: Troy [name] MRN: [id number] DOB: April 13, 1961 Date of Encounter: 03/18/2023  Primary Cardiologist: None  Subjective   Chief Complaint: None.   HPI: Back in AFL. EP wants him on amio over the weekend and DCCV Monday.   ROS:  All other ROS reviewed and negative. Pertinent positives noted in the HPI.     Inpatient Medications  Scheduled Meds:  apixaban  5 mg Oral BID   vitamin C  1,000 mg Oral Daily   clopidogrel  75 mg Oral Daily   docusate sodium  100 mg Oral Daily   empagliflozin  10 mg Oral QAC breakfast   feeding supplement  237 mL Oral BID BM   glipiZIDE  10 mg Oral BID   insulin aspart  0-15 Units Subcutaneous TID WC   insulin aspart  4 Units Subcutaneous TID WC   insulin glargine-yfgn  20 Units Subcutaneous Daily   metoprolol succinate  25 mg Oral Daily   nutrition supplement (JUVEN)  1 packet Oral BID BM   pantoprazole  40 mg Oral Daily   rosuvastatin  20 mg Oral Daily   zinc sulfate  220 mg Oral Daily   Continuous Infusions:  sodium chloride 200 mL/hr at 03/17/23 1851   amiodarone 30 mg/hr (03/18/23 0802)   magnesium sulfate bolus IVPB     vancomycin 1,500 mg (03/18/23 0014)   PRN Meds: acetaminophen, alum & mag hydroxide-simeth, bisacodyl, guaiFENesin-dextromethorphan, HYDROmorphone (DILAUDID) injection, magnesium citrate, magnesium sulfate bolus IVPB, ondansetron, oxyCODONE, oxyCODONE, phenol, polyethylene glycol, potassium chloride   Vital Signs   Vitals:   03/17/23 2335 03/18/23 0218 03/18/23 0420 03/18/23 0733  BP: 112/84 103/66 115/82 123/86  Pulse: (!) 129 (!) 129 (!) 127 (!) 128  Resp: 18 18 16 16   Temp:   98.2 F (36.8 C) 98.1 F (36.7 C)  TempSrc:   Oral Oral  SpO2: 96% 96% 94% 96%  Weight:      Height:        Intake/Output Summary (Last 24 hours) at 03/18/2023 0923 Last data filed at 03/18/2023 0420 Gross per 24 hour  Intake 1810.88 ml  Output 150 ml  Net 1660.88 ml      03/11/2023    7:09 AM  02/23/2023    8:40 AM 02/16/2023    4:05 AM  Last 3 Weights  Weight (lbs) 175 lb 176 lb 173 lb 4.8 oz  Weight (kg) 79.379 kg 79.833 kg 78.608 kg      Telemetry  Overnight telemetry shows AFL 128 bpm, which I personally reviewed.   ECG  The most recent ECG shows AFL, which I personally reviewed.   Physical Exam   Vitals:   03/17/23 2335 03/18/23 0218 03/18/23 0420 03/18/23 0733  BP: 112/84 103/66 115/82 123/86  Pulse: (!) 129 (!) 129 (!) 127 (!) 128  Resp: 18 18 16 16   Temp:   98.2 F (36.8 C) 98.1 F (36.7 C)  TempSrc:   Oral Oral  SpO2: 96% 96% 94% 96%  Weight:      Height:        Intake/Output Summary (Last 24 hours) at 03/18/2023 0923 Last data filed at 03/18/2023 0420 Gross per 24 hour  Intake 1810.88 ml  Output 150 ml  Net 1660.88 ml       03/11/2023    7:09 AM 02/23/2023    8:40 AM 02/16/2023    4:05 AM  Last 3 Weights  Weight (lbs) 175 lb 176 lb 173 lb 4.8 oz  Weight (kg) 79.379 kg 79.833 kg 78.608 kg    Body mass index is 24.41 kg/m.  General: Well nourished, well developed, in no acute distress Head: Atraumatic, normal size  Eyes: PEERLA, EOMI  Neck: Supple, no JVD Endocrine: No thryomegaly Cardiac: Normal S1, S2; tachycardia, no murmurs  Lungs: Clear to auscultation bilaterally, no wheezing, rhonchi or rales  Abd: Soft, nontender, no hepatomegaly  Ext: Bilateral leg amputations Musculoskeletal: No deformities, BUE and BLE strength normal and equal Skin: Warm and dry, no rashes   Neuro: Alert and oriented to person, place, time, and situation, CNII-XII grossly intact, no focal deficits  Psych: Normal mood and affect   Labs  High Sensitivity Troponin:  No results for input(s): "TROPONINIHS" in the last 720 hours.   Cardiac EnzymesNo results for input(s): "TROPONINI" in the last 168 hours. No results for input(s): "TROPIPOC" in the last 168 hours.  Chemistry Recent Labs  Lab 03/13/23 1040 03/16/23 1039 03/17/23 0500  NA 133* 136 135  K 3.7 3.6 3.7   CL 103 107 111  CO2 23 21* 19*  GLUCOSE 146* 153* 72  BUN 12 15 22   CREATININE 0.56* 0.55* 0.66  CALCIUM 7.9* 7.8* 7.6*  PROT  --  6.0*  --   ALBUMIN  --  1.8*  --   AST  --  21  --   ALT  --  14  --   ALKPHOS  --  67  --   BILITOT  --  0.5  --   GFRNONAA >60 >60 >60  ANIONGAP 7 8 5     Hematology Recent Labs  Lab 03/13/23 1040 03/16/23 1039 03/17/23 0812  WBC 17.5* 22.8* 18.4*  RBC 3.31* 3.06* 3.40*  HGB 8.5* 7.9* 9.2*  HCT 27.3* 25.1* 28.9*  MCV 82.5 82.0 85.0  MCH 25.7* 25.8* 27.1  MCHC 31.1 31.5 31.8  RDW 17.2* 17.2* 17.6*  PLT 369 304 354   BNPNo results for input(s): "BNP", "PROBNP" in the last 168 hours.  DDimer No results for input(s): "DDIMER" in the last 168 hours.   Radiology  ECHO TEE  Result Date: 03/17/2023    TRANSESOPHOGEAL ECHO REPORT   Patient Name:   Troy [name] [facility] Date of Exam: 03/17/2023 Medical Rec #:  [id number]     Height:       71.0 in Accession #:    [id number]    Weight:       175.0 lb Date of Birth:  [birth year]     BSA:          1.992 m Patient Age:    62 years      BP:           101/81 mmHg Patient Gender: M             HR:           142 bpm. Exam Location:  Inpatient Procedure: 2D Echo, Color Doppler and Cardiac Doppler Indications:     A flutter  History:         Patient has prior history of Echocardiogram examinations, most                  recent 02/06/2023.  Sonographer:     Harriette Bouillon RDCS Referring Phys:  [id number] Lisette Abu HILTY Diagnosing Phys: Zoila Shutter MD PROCEDURE: After discussion of the risks and benefits of a TEE, an informed consent was obtained from the patient. The transesophogeal probe was passed without difficulty through the esophogus of  Cardiology Progress Note  Patient ID: Troy [name] MRN: [id number] DOB: April 13, 1961 Date of Encounter: 03/18/2023  Primary Cardiologist: None  Subjective   Chief Complaint: None.   HPI: Back in AFL. EP wants him on amio over the weekend and DCCV Monday.   ROS:  All other ROS reviewed and negative. Pertinent positives noted in the HPI.     Inpatient Medications  Scheduled Meds:  apixaban  5 mg Oral BID   vitamin C  1,000 mg Oral Daily   clopidogrel  75 mg Oral Daily   docusate sodium  100 mg Oral Daily   empagliflozin  10 mg Oral QAC breakfast   feeding supplement  237 mL Oral BID BM   glipiZIDE  10 mg Oral BID   insulin aspart  0-15 Units Subcutaneous TID WC   insulin aspart  4 Units Subcutaneous TID WC   insulin glargine-yfgn  20 Units Subcutaneous Daily   metoprolol succinate  25 mg Oral Daily   nutrition supplement (JUVEN)  1 packet Oral BID BM   pantoprazole  40 mg Oral Daily   rosuvastatin  20 mg Oral Daily   zinc sulfate  220 mg Oral Daily   Continuous Infusions:  sodium chloride 200 mL/hr at 03/17/23 1851   amiodarone 30 mg/hr (03/18/23 0802)   magnesium sulfate bolus IVPB     vancomycin 1,500 mg (03/18/23 0014)   PRN Meds: acetaminophen, alum & mag hydroxide-simeth, bisacodyl, guaiFENesin-dextromethorphan, HYDROmorphone (DILAUDID) injection, magnesium citrate, magnesium sulfate bolus IVPB, ondansetron, oxyCODONE, oxyCODONE, phenol, polyethylene glycol, potassium chloride   Vital Signs   Vitals:   03/17/23 2335 03/18/23 0218 03/18/23 0420 03/18/23 0733  BP: 112/84 103/66 115/82 123/86  Pulse: (!) 129 (!) 129 (!) 127 (!) 128  Resp: 18 18 16 16   Temp:   98.2 F (36.8 C) 98.1 F (36.7 C)  TempSrc:   Oral Oral  SpO2: 96% 96% 94% 96%  Weight:      Height:        Intake/Output Summary (Last 24 hours) at 03/18/2023 0923 Last data filed at 03/18/2023 0420 Gross per 24 hour  Intake 1810.88 ml  Output 150 ml  Net 1660.88 ml      03/11/2023    7:09 AM  02/23/2023    8:40 AM 02/16/2023    4:05 AM  Last 3 Weights  Weight (lbs) 175 lb 176 lb 173 lb 4.8 oz  Weight (kg) 79.379 kg 79.833 kg 78.608 kg      Telemetry  Overnight telemetry shows AFL 128 bpm, which I personally reviewed.   ECG  The most recent ECG shows AFL, which I personally reviewed.   Physical Exam   Vitals:   03/17/23 2335 03/18/23 0218 03/18/23 0420 03/18/23 0733  BP: 112/84 103/66 115/82 123/86  Pulse: (!) 129 (!) 129 (!) 127 (!) 128  Resp: 18 18 16 16   Temp:   98.2 F (36.8 C) 98.1 F (36.7 C)  TempSrc:   Oral Oral  SpO2: 96% 96% 94% 96%  Weight:      Height:        Intake/Output Summary (Last 24 hours) at 03/18/2023 0923 Last data filed at 03/18/2023 0420 Gross per 24 hour  Intake 1810.88 ml  Output 150 ml  Net 1660.88 ml       03/11/2023    7:09 AM 02/23/2023    8:40 AM 02/16/2023    4:05 AM  Last 3 Weights  Weight (lbs) 175 lb 176 lb 173 lb 4.8 oz  Weight (kg) 79.379 kg 79.833 kg 78.608 kg    Body mass index is 24.41 kg/m.  General: Well nourished, well developed, in no acute distress Head: Atraumatic, normal size  Eyes: PEERLA, EOMI  Neck: Supple, no JVD Endocrine: No thryomegaly Cardiac: Normal S1, S2; tachycardia, no murmurs  Lungs: Clear to auscultation bilaterally, no wheezing, rhonchi or rales  Abd: Soft, nontender, no hepatomegaly  Ext: Bilateral leg amputations Musculoskeletal: No deformities, BUE and BLE strength normal and equal Skin: Warm and dry, no rashes   Neuro: Alert and oriented to person, place, time, and situation, CNII-XII grossly intact, no focal deficits  Psych: Normal mood and affect   Labs  High Sensitivity Troponin:  No results for input(s): "TROPONINIHS" in the last 720 hours.   Cardiac EnzymesNo results for input(s): "TROPONINI" in the last 168 hours. No results for input(s): "TROPIPOC" in the last 168 hours.  Chemistry Recent Labs  Lab 03/13/23 1040 03/16/23 1039 03/17/23 0500  NA 133* 136 135  K 3.7 3.6 3.7   CL 103 107 111  CO2 23 21* 19*  GLUCOSE 146* 153* 72  BUN 12 15 22   CREATININE 0.56* 0.55* 0.66  CALCIUM 7.9* 7.8* 7.6*  PROT  --  6.0*  --   ALBUMIN  --  1.8*  --   AST  --  21  --   ALT  --  14  --   ALKPHOS  --  67  --   BILITOT  --  0.5  --   GFRNONAA >60 >60 >60  ANIONGAP 7 8 5     Hematology Recent Labs  Lab 03/13/23 1040 03/16/23 1039 03/17/23 0812  WBC 17.5* 22.8* 18.4*  RBC 3.31* 3.06* 3.40*  HGB 8.5* 7.9* 9.2*  HCT 27.3* 25.1* 28.9*  MCV 82.5 82.0 85.0  MCH 25.7* 25.8* 27.1  MCHC 31.1 31.5 31.8  RDW 17.2* 17.2* 17.6*  PLT 369 304 354   BNPNo results for input(s): "BNP", "PROBNP" in the last 168 hours.  DDimer No results for input(s): "DDIMER" in the last 168 hours.   Radiology  ECHO TEE  Result Date: 03/17/2023    TRANSESOPHOGEAL ECHO REPORT   Patient Name:   Troy [name] [facility] Date of Exam: 03/17/2023 Medical Rec #:  [id number]     Height:       71.0 in Accession #:    [id number]    Weight:       175.0 lb Date of Birth:  [birth year]     BSA:          1.992 m Patient Age:    62 years      BP:           101/81 mmHg Patient Gender: M             HR:           142 bpm. Exam Location:  Inpatient Procedure: 2D Echo, Color Doppler and Cardiac Doppler Indications:     A flutter  History:         Patient has prior history of Echocardiogram examinations, most                  recent 02/06/2023.  Sonographer:     Harriette Bouillon RDCS Referring Phys:  [id number] Lisette Abu HILTY Diagnosing Phys: Zoila Shutter MD PROCEDURE: After discussion of the risks and benefits of a TEE, an informed consent was obtained from the patient. The transesophogeal probe was passed without difficulty through the esophogus of

## 2023-03-18 NOTE — Progress Notes (Signed)
Occupational Therapy Treatment Patient Details Name: Troy [name] MRN: [id number] DOB: [birth year] Today's Date: 03/18/2023   History of present illness Pt is a 62 yo male presenting with dehiscence of R transmet amputation site. 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 S/p TEE and cardioversion for aflutter on 9/26, back in aflutter as of 9/27 with plan for another cardioversion attempt on 9/30. PMH: CHF, COPD, DM2, HTN, restless leg syndrome, PTSD, RLE trans met amputation, L AKA   OT comments  Patient now s/p R BKA.  Pain in the limiting factor limiting OOB and mobility.  Patient needing closer to Max A for lower body ADL and setup for upper body, but at bedlevel.  Patient is rolling with increased time and supervision with side rails, but needing Min A and increased time for basic bed mobility.  OT downgraded goals and will continue efforts to address deficits, and Patient will benefit from continued inpatient follow up therapy, <3 hours/day       If plan is discharge home, recommend the following:  Assist for transportation;Assistance with cooking/housework;A lot of help with walking and/or transfers;A lot of help with bathing/dressing/bathroom   Equipment Recommendations  None recommended by OT    Recommendations for Other Services      Precautions / Restrictions Precautions Precautions: Fall Precaution Comments: VAC R BKA Required Braces or Orthoses: Other Brace Other Brace: R residual limb protector Restrictions Weight Bearing Restrictions: Yes RLE Weight Bearing: Non weight bearing       Mobility Bed Mobility   Bed Mobility: Rolling, Supine to Sit, Sit to Supine Rolling: Min assist   Supine to sit: Min assist Sit to supine: Min assist, Mod assist        Transfers                         Balance Overall balance assessment: Needs assistance Sitting-balance support: Bilateral upper extremity supported Sitting balance-Leahy Scale: Fair                                      ADL either performed or assessed with clinical judgement   ADL                   Upper Body Dressing : Minimal assistance;Sitting   Lower Body Dressing: Maximal assistance;Bed level                      Extremity/Trunk Assessment Upper Extremity Assessment Upper Extremity Assessment: Generalized weakness   Lower Extremity Assessment Lower Extremity Assessment: Defer to PT evaluation   Cervical / Trunk Assessment Cervical / Trunk Assessment: Normal    Vision Patient Visual Report: No change from baseline     Perception Perception Perception: Not tested   Praxis Praxis Praxis: Not tested    Cognition Arousal: Alert Behavior During Therapy: Flat affect Overall Cognitive Status: History of cognitive impairments - at baseline                                                             Pertinent Vitals/ Pain       Pain Assessment Pain Assessment: Faces Faces Pain Scale: Hurts even more Pain Location: R leg,  back, buttocks Pain Descriptors / Indicators: Guarding, Grimacing, Discomfort Pain Intervention(s): Monitored during session                                                          Frequency  Min 1X/week        Progress Toward Goals  OT Goals(current goals can now be found in the care plan section)  Progress towards OT goals: Goals drowngraded-see care plan  Acute Rehab OT Goals OT Goal Formulation: With patient Time For Goal Achievement: 04/01/23 Potential to Achieve Goals: Fair ADL Goals Pt Will Perform Upper Body Bathing: with supervision;sitting Pt Will Perform Lower Body Bathing: with min assist;sitting/lateral leans Pt Will Perform Upper Body Dressing: with supervision;sitting Pt Will Perform Lower Body Dressing: with min assist;sitting/lateral leans Pt Will Transfer to Toilet: with min assist;with transfer board  Plan      Co-evaluation                  AM-PAC OT "6 Clicks" Daily Activity     Outcome Measure   Help from another person eating meals?: None Help from another person taking care of personal grooming?: A Little Help from another person toileting, which includes using toliet, bedpan, or urinal?: A Lot Help from another person bathing (including washing, rinsing, drying)?: A Lot Help from another person to put on and taking off regular upper body clothing?: A Lot Help from another person to put on and taking off regular lower body clothing?: A Lot 6 Click Score: 15    End of Session    OT Visit Diagnosis: Pain Pain - Right/Left: Right Pain - part of body: Leg   Activity Tolerance Patient limited by pain   Patient Left in bed;with call bell/phone within reach   Nurse Communication Mobility status        Time: 4098-1191 OT Time Calculation (min): 17 min  Charges: OT General Charges $OT Visit: 1 Visit OT Treatments $Self Care/Home Management : 8-22 mins  03/18/2023  RP, OTR/L  Acute Rehabilitation Services  Office:  [phone]   [name] 03/18/2023, 3:24 PM

## 2023-03-18 NOTE — TOC Progression Note (Signed)
Transition of Care [facility]) - Progression Note    Patient Details  Name: Troy [name] MRN: [id number] Date of Birth: May 11, 1961  Transition of Care [facility]) CM/SW Contact  Graves-Bigelow, Lamar Laundry, RN Phone Number: 03/18/2023, 11:33 AM  Clinical Narrative: Case Manager received a consult for home wound vac. Per Ortho notes,patient will discharge with a Praveena Wound Vac. Case Manager spoke with patient regarding disposition needs; spouse at the bedside during the conversation. Patient is agreeable to SNF for short term rehab. Awaiting PT/OT recommendations. CSW is aware that the patients first choice is [facility] and 2nd choice may be the [facility]. Patient states he has DME: wheelchair in the home. Case Manager will continue to follow for additional transition of care needs as the patient progresses. CSW to follow.    Expected Discharge Plan: Skilled Nursing Facility Barriers to Discharge: Other (must enter comment) (Pt not working with PT, pt wants to go home, wife refusing to receive pt)  Expected Discharge Plan and Services In-house Referral: Clinical Social Work Discharge Planning Services: CM Consult Post Acute Care Choice: Skilled Nursing Facility Living arrangements for the past 2 months: Single Family Home Expected Discharge Date: 03/15/23                Social Determinants of Health (SDOH) Interventions SDOH Screenings   Food Insecurity: No Food Insecurity (03/11/2023)  Housing: Low Risk  (03/11/2023)  Transportation Needs: No Transportation Needs (03/11/2023)  Utilities: Not At Risk (03/11/2023)  Financial Resource Strain: Low Risk  (04/09/2021)   Received from [facility], [facility]  Physical Activity: Insufficiently Active (04/09/2021)   Received from [facility], [facility]  Social Connections: Unknown (10/22/2021)   Received from [facility], [facility]  Stress: No Stress Concern Present (04/09/2021)   Received from [facility], [facility]   Tobacco Use: High Risk (03/11/2023)    Readmission Risk Interventions    08/25/2021   12:46 PM  Readmission Risk Prevention Plan  Transportation Screening Complete  Medication Review (RN Care Manager) Complete  PCP or Specialist appointment within 3-5 days of discharge Complete  HRI or Home Care Consult Complete  SW Recovery Care/Counseling Consult Complete  Palliative Care Screening Not Applicable  Skilled Nursing Facility Complete

## 2023-03-18 NOTE — Plan of Care (Signed)
  Problem: Education: Goal: Knowledge of the prescribed therapeutic regimen will improve Outcome: Progressing Goal: Ability to verbalize activity precautions or restrictions will improve Outcome: Progressing Goal: Understanding of discharge needs will improve Outcome: Progressing   Problem: Activity: Goal: Ability to perform//tolerate increased activity and mobilize with assistive devices will improve Outcome: Progressing   Problem: Clinical Measurements: Goal: Postoperative complications will be avoided or minimized Outcome: Progressing   Problem: Self-Care: Goal: Ability to meet self-care needs will improve Outcome: Progressing   Problem: Self-Concept: Goal: Ability to maintain and perform role responsibilities to the fullest extent possible will improve Outcome: Progressing   Problem: Pain Management: Goal: Pain level will decrease with appropriate interventions Outcome: Progressing   Problem: Skin Integrity: Goal: Demonstration of wound healing without infection will improve Outcome: Progressing   Problem: Education: Goal: Understanding of CV disease, CV risk reduction, and recovery process will improve Outcome: Progressing Goal: Individualized Educational Video(s) Outcome: Progressing   Problem: Activity: Goal: Ability to return to baseline activity level will improve Outcome: Progressing   Problem: Cardiovascular: Goal: Ability to achieve and maintain adequate cardiovascular perfusion will improve Outcome: Progressing Goal: Vascular access site(s) Level 0-1 will be maintained Outcome: Progressing   Problem: Health Behavior/Discharge Planning: Goal: Ability to safely manage health-related needs after discharge will improve Outcome: Progressing   Problem: Education: Goal: Ability to describe self-care measures that may prevent or decrease complications (Diabetes Survival Skills Education) will improve Outcome: Progressing Goal: Individualized Educational  Video(s) Outcome: Progressing   Problem: Coping: Goal: Ability to adjust to condition or change in health will improve Outcome: Progressing   Problem: Fluid Volume: Goal: Ability to maintain a balanced intake and output will improve Outcome: Progressing   Problem: Health Behavior/Discharge Planning: Goal: Ability to identify and utilize available resources and services will improve Outcome: Progressing Goal: Ability to manage health-related needs will improve Outcome: Progressing   Problem: Metabolic: Goal: Ability to maintain appropriate glucose levels will improve Outcome: Progressing   Problem: Nutritional: Goal: Maintenance of adequate nutrition will improve Outcome: Progressing Goal: Progress toward achieving an optimal weight will improve Outcome: Progressing   Problem: Skin Integrity: Goal: Risk for impaired skin integrity will decrease Outcome: Progressing   Problem: Tissue Perfusion: Goal: Adequacy of tissue perfusion will improve Outcome: Progressing   Problem: Education: Goal: Knowledge of General Education information will improve Description: Including pain rating scale, medication(s)/side effects and non-pharmacologic comfort measures Outcome: Progressing   Problem: Health Behavior/Discharge Planning: Goal: Ability to manage health-related needs will improve Outcome: Progressing   Problem: Clinical Measurements: Goal: Ability to maintain clinical measurements within normal limits will improve Outcome: Progressing Goal: Will remain free from infection Outcome: Progressing Goal: Diagnostic test results will improve Outcome: Progressing Goal: Respiratory complications will improve Outcome: Progressing Goal: Cardiovascular complication will be avoided Outcome: Progressing   Problem: Activity: Goal: Risk for activity intolerance will decrease Outcome: Progressing   Problem: Nutrition: Goal: Adequate nutrition will be maintained Outcome:  Progressing   Problem: Coping: Goal: Level of anxiety will decrease Outcome: Progressing   Problem: Elimination: Goal: Will not experience complications related to bowel motility Outcome: Progressing Goal: Will not experience complications related to urinary retention Outcome: Progressing   Problem: Pain Managment: Goal: General experience of comfort will improve Outcome: Progressing   Problem: Safety: Goal: Ability to remain free from injury will improve Outcome: Progressing   Problem: Skin Integrity: Goal: Risk for impaired skin integrity will decrease Outcome: Progressing

## 2023-03-19 DIAGNOSIS — I483 Typical atrial flutter: Secondary | ICD-10-CM | POA: Diagnosis not present

## 2023-03-19 LAB — GLUCOSE, CAPILLARY
Glucose-Capillary: 116 mg/dL — ABNORMAL HIGH (ref 70–99)
Glucose-Capillary: 197 mg/dL — ABNORMAL HIGH (ref 70–99)
Glucose-Capillary: 143 mg/dL — ABNORMAL HIGH (ref 70–99)
Glucose-Capillary: 193 mg/dL — ABNORMAL HIGH (ref 70–99)

## 2023-03-19 NOTE — Progress Notes (Signed)
Patient ID: Troy [name], male   DOB: 20-May-1961, 62 y.o.   MRN: [id number] Patient is status post right below-knee amputation.  Anticipate cardiac intervention on Monday.  There is no drainage in the wound VAC canister.  Discussed the importance of mobilization getting out of bed to a chair and gait training with therapy.

## 2023-03-19 NOTE — Plan of Care (Signed)
  Problem: Education: Goal: Knowledge of the prescribed therapeutic regimen will improve Outcome: Progressing Goal: Understanding of discharge needs will improve Outcome: Progressing   Problem: Activity: Goal: Ability to perform//tolerate increased activity and mobilize with assistive devices will improve Outcome: Not Progressing   Problem: Clinical Measurements: Goal: Postoperative complications will be avoided or minimized Outcome: Not Progressing   Problem: Pain Management: Goal: Pain level will decrease with appropriate interventions Outcome: Not Progressing   Problem: Activity: Goal: Ability to return to baseline activity level will improve Outcome: Not Progressing   Problem: Cardiovascular: Goal: Ability to achieve and maintain adequate cardiovascular perfusion will improve Outcome: Not Progressing   Problem: Skin Integrity: Goal: Risk for impaired skin integrity will decrease Outcome: Not Progressing

## 2023-03-19 NOTE — TOC Progression Note (Signed)
Transition of Care [place]) - Progression Note    Patient Details  Name: Troy [name] MRN: [id number] Date of Birth: 1960/07/12  Transition of Care [facility]) CM/SW Contact  Patrice Paradise, LCSW Phone Number: 03/19/2023, 1:37 PM  Clinical Narrative:     Patient has no bed offers.  TOC team will continue to assist with discharge planning needs.   Expected Discharge Plan: Skilled Nursing Facility Barriers to Discharge: Other (must enter comment) (Pt not working with PT, pt wants to go home, wife refusing to receive pt)  Expected Discharge Plan and Services In-house Referral: Clinical Social Work Discharge Planning Services: CM Consult Post Acute Care Choice: Skilled Nursing Facility Living arrangements for the past 2 months: Single Family Home Expected Discharge Date: 03/15/23                                     Social Determinants of Health (SDOH) Interventions SDOH Screenings   Food Insecurity: No Food Insecurity (03/11/2023)  Housing: Low Risk  (03/11/2023)  Transportation Needs: No Transportation Needs (03/11/2023)  Utilities: Not At Risk (03/11/2023)  Financial Resource Strain: Low Risk  (04/09/2021)   Received from [facility], [facility]  Physical Activity: Insufficiently Active (04/09/2021)   Received from [facility], [facility]  Social Connections: Unknown (10/22/2021)   Received from [facility], [facility]  Stress: No Stress Concern Present (04/09/2021)   Received from [facility], [facility]  Tobacco Use: High Risk (03/11/2023)    Readmission Risk Interventions    08/25/2021   12:46 PM  Readmission Risk Prevention Plan  Transportation Screening Complete  Medication Review (RN Care Manager) Complete  PCP or Specialist appointment within 3-5 days of discharge Complete  HRI or Home Care Consult Complete  SW Recovery Care/Counseling Consult Complete  Palliative Care Screening Not Applicable  Skilled Nursing Facility Complete

## 2023-03-19 NOTE — Plan of Care (Signed)
  Problem: Education: Goal: Knowledge of the prescribed therapeutic regimen will improve Outcome: Progressing Goal: Ability to verbalize activity precautions or restrictions will improve Outcome: Progressing Goal: Understanding of discharge needs will improve Outcome: Progressing   Problem: Activity: Goal: Ability to perform//tolerate increased activity and mobilize with assistive devices will improve Outcome: Progressing   Problem: Clinical Measurements: Goal: Postoperative complications will be avoided or minimized Outcome: Progressing   Problem: Self-Care: Goal: Ability to meet self-care needs will improve Outcome: Progressing   Problem: Self-Concept: Goal: Ability to maintain and perform role responsibilities to the fullest extent possible will improve Outcome: Progressing   Problem: Pain Management: Goal: Pain level will decrease with appropriate interventions Outcome: Progressing   Problem: Skin Integrity: Goal: Demonstration of wound healing without infection will improve Outcome: Progressing   Problem: Education: Goal: Understanding of CV disease, CV risk reduction, and recovery process will improve Outcome: Progressing Goal: Individualized Educational Video(s) Outcome: Progressing   Problem: Activity: Goal: Ability to return to baseline activity level will improve Outcome: Progressing   Problem: Cardiovascular: Goal: Ability to achieve and maintain adequate cardiovascular perfusion will improve Outcome: Progressing Goal: Vascular access site(s) Level 0-1 will be maintained Outcome: Progressing   Problem: Health Behavior/Discharge Planning: Goal: Ability to safely manage health-related needs after discharge will improve Outcome: Progressing   Problem: Education: Goal: Ability to describe self-care measures that may prevent or decrease complications (Diabetes Survival Skills Education) will improve Outcome: Progressing Goal: Individualized Educational  Video(s) Outcome: Progressing   Problem: Coping: Goal: Ability to adjust to condition or change in health will improve Outcome: Progressing   Problem: Fluid Volume: Goal: Ability to maintain a balanced intake and output will improve Outcome: Progressing   Problem: Health Behavior/Discharge Planning: Goal: Ability to identify and utilize available resources and services will improve Outcome: Progressing Goal: Ability to manage health-related needs will improve Outcome: Progressing   Problem: Metabolic: Goal: Ability to maintain appropriate glucose levels will improve Outcome: Progressing   Problem: Nutritional: Goal: Maintenance of adequate nutrition will improve Outcome: Progressing Goal: Progress toward achieving an optimal weight will improve Outcome: Progressing   Problem: Skin Integrity: Goal: Risk for impaired skin integrity will decrease Outcome: Progressing   Problem: Tissue Perfusion: Goal: Adequacy of tissue perfusion will improve Outcome: Progressing   Problem: Education: Goal: Knowledge of General Education information will improve Description: Including pain rating scale, medication(s)/side effects and non-pharmacologic comfort measures Outcome: Progressing   Problem: Health Behavior/Discharge Planning: Goal: Ability to manage health-related needs will improve Outcome: Progressing   Problem: Clinical Measurements: Goal: Ability to maintain clinical measurements within normal limits will improve Outcome: Progressing Goal: Will remain free from infection Outcome: Progressing Goal: Diagnostic test results will improve Outcome: Progressing Goal: Respiratory complications will improve Outcome: Progressing Goal: Cardiovascular complication will be avoided Outcome: Progressing   Problem: Activity: Goal: Risk for activity intolerance will decrease Outcome: Progressing   Problem: Nutrition: Goal: Adequate nutrition will be maintained Outcome:  Progressing   Problem: Coping: Goal: Level of anxiety will decrease Outcome: Progressing   Problem: Elimination: Goal: Will not experience complications related to bowel motility Outcome: Progressing Goal: Will not experience complications related to urinary retention Outcome: Progressing   Problem: Pain Managment: Goal: General experience of comfort will improve Outcome: Progressing   Problem: Safety: Goal: Ability to remain free from injury will improve Outcome: Progressing   Problem: Skin Integrity: Goal: Risk for impaired skin integrity will decrease Outcome: Progressing

## 2023-03-19 NOTE — Progress Notes (Signed)
Cardiology Progress Note  Patient ID: Troy [name] MRN: [id number] DOB: 05/08/1961 Date of Encounter: 03/19/2023  Primary Cardiologist: None  Subjective   Chief Complaint: None.   HPI: Back in AFL. EP wants him on amio over the weekend and DCCV Monday.  Otherwise he feels ok  ROS:  All other ROS reviewed and negative. Pertinent positives noted in the HPI.     Inpatient Medications  Scheduled Meds:  apixaban  5 mg Oral BID   vitamin C  1,000 mg Oral Daily   clopidogrel  75 mg Oral Daily   docusate sodium  100 mg Oral Daily   doxycycline  100 mg Oral Q12H   empagliflozin  10 mg Oral QAC breakfast   feeding supplement  237 mL Oral BID BM   glipiZIDE  10 mg Oral BID   insulin aspart  0-15 Units Subcutaneous TID WC   insulin aspart  4 Units Subcutaneous TID WC   insulin glargine-yfgn  20 Units Subcutaneous Daily   metoprolol succinate  25 mg Oral Daily   nutrition supplement (JUVEN)  1 packet Oral BID BM   pantoprazole  40 mg Oral Daily   rosuvastatin  20 mg Oral Daily   zinc sulfate  220 mg Oral Daily   Continuous Infusions:  sodium chloride Stopped (03/19/23 0459)   amiodarone 30 mg/hr (03/19/23 0501)   magnesium sulfate bolus IVPB     PRN Meds: acetaminophen, alum & mag hydroxide-simeth, bisacodyl, guaiFENesin-dextromethorphan, HYDROmorphone (DILAUDID) injection, magnesium citrate, magnesium sulfate bolus IVPB, ondansetron, oxyCODONE, oxyCODONE, phenol, polyethylene glycol, potassium chloride   Vital Signs   Vitals:   03/19/23 0012 03/19/23 0136 03/19/23 0414 03/19/23 0808  BP: 95/62 115/73 104/63 109/76  Pulse:  (!) 131 (!) 42 (!) 135  Resp: 18   16  Temp: 98.5 F (36.9 C)   98.1 F (36.7 C)  TempSrc: Oral   Oral  SpO2:  97% (!) 81% 98%  Weight:      Height:        Intake/Output Summary (Last 24 hours) at 03/19/2023 0852 Last data filed at 03/19/2023 0600 Gross per 24 hour  Intake 3298.63 ml  Output 600 ml  Net 2698.63 ml      03/11/2023    7:09 AM  02/23/2023    8:40 AM 02/16/2023    4:05 AM  Last 3 Weights  Weight (lbs) 175 lb 176 lb 173 lb 4.8 oz  Weight (kg) 79.379 kg 79.833 kg 78.608 kg      Telemetry  Remains in flutter 130s which I personally reviewed.   ECG  No new which I personally reviewed.   Physical Exam   Vitals:   03/19/23 0012 03/19/23 0136 03/19/23 0414 03/19/23 0808  BP: 95/62 115/73 104/63 109/76  Pulse:  (!) 131 (!) 42 (!) 135  Resp: 18   16  Temp: 98.5 F (36.9 C)   98.1 F (36.7 C)  TempSrc: Oral   Oral  SpO2:  97% (!) 81% 98%  Weight:      Height:        Intake/Output Summary (Last 24 hours) at 03/19/2023 0852 Last data filed at 03/19/2023 0600 Gross per 24 hour  Intake 3298.63 ml  Output 600 ml  Net 2698.63 ml       03/11/2023    7:09 AM 02/23/2023    8:40 AM 02/16/2023    4:05 AM  Last 3 Weights  Weight (lbs) 175 lb 176 lb 173 lb 4.8 oz  Weight (kg)  79.379 kg 79.833 kg 78.608 kg    Body mass index is 24.41 kg/m.  General: Well nourished, well developed, in no acute distress. Lying flat comfortably Head: Atraumatic, normal size  Eyes: PEERLA, EOMI  Neck: Supple,  Endocrine: No thryomegaly Cardiac: Normal S1, S2; tachycardia, no murmurs  Lungs: Clear to auscultation bilaterally, no wheezing, rhonchi or rales  Abd: Soft, nontender, no hepatomegaly  Ext: Bilateral leg amputations Skin: Warm and dry, no rashes   Neuro: Alert and oriented to person, place, time, and situation, CNII-XII grossly intact, no focal deficits  Psych: Normal mood and affect   Labs  High Sensitivity Troponin:  No results for input(s): "TROPONINIHS" in the last 720 hours.   Cardiac EnzymesNo results for input(s): "TROPONINI" in the last 168 hours. No results for input(s): "TROPIPOC" in the last 168 hours.  Chemistry Recent Labs  Lab 03/16/23 1039 03/17/23 0500 03/18/23 1915  NA 136 135 135  K 3.6 3.7 4.0  CL 107 111 107  CO2 21* 19* 20*  GLUCOSE 153* 72 114*  BUN 15 22 21   CREATININE 0.55* 0.66 0.82   CALCIUM 7.8* 7.6* 7.7*  PROT 6.0*  --  6.0*  ALBUMIN 1.8*  --  1.9*  AST 21  --  25  ALT 14  --  19  ALKPHOS 67  --  71  BILITOT 0.5  --  0.6  GFRNONAA >60 >60 >60  ANIONGAP 8 5 8     Hematology Recent Labs  Lab 03/16/23 1039 03/17/23 0812 03/18/23 1915  WBC 22.8* 18.4* 20.2*  RBC 3.06* 3.40* 3.31*  HGB 7.9* 9.2* 8.7*  HCT 25.1* 28.9* 28.1*  MCV 82.0 85.0 84.9  MCH 25.8* 27.1 26.3  MCHC 31.5 31.8 31.0  RDW 17.2* 17.6* 18.5*  PLT 304 354 327   BNPNo results for input(s): "BNP", "PROBNP" in the last 168 hours.  DDimer No results for input(s): "DDIMER" in the last 168 hours.   Radiology  ECHO TEE  Result Date: 03/17/2023    TRANSESOPHOGEAL ECHO REPORT   Patient Name:   Troy [name] [facility] Date of Exam: 03/17/2023 Medical Rec #:  [id number]     Height:       71.0 in Accession #:    [id number]    Weight:       175.0 lb Date of Birth:  08/19/1960     BSA:          1.992 m Patient Age:    62 years      BP:           101/81 mmHg Patient Gender: M             HR:           142 bpm. Exam Location:  Inpatient Procedure: 2D Echo, Color Doppler and Cardiac Doppler Indications:     A flutter  History:         Patient has prior history of Echocardiogram examinations, most                  recent 02/06/2023.  Sonographer:     Harriette Bouillon RDCS Referring Phys:  [id number] Lisette Abu HILTY Diagnosing Phys: Zoila Shutter MD PROCEDURE: After discussion of the risks and benefits of a TEE, an informed consent was obtained from the patient. The transesophogeal probe was passed without difficulty through the esophogus of the patient. Sedation performed by different physician. The patient developed no complications during the procedure.  IMPRESSIONS  1. Left ventricular ejection  79.379 kg 79.833 kg 78.608 kg    Body mass index is 24.41 kg/m.  General: Well nourished, well developed, in no acute distress. Lying flat comfortably Head: Atraumatic, normal size  Eyes: PEERLA, EOMI  Neck: Supple,  Endocrine: No thryomegaly Cardiac: Normal S1, S2; tachycardia, no murmurs  Lungs: Clear to auscultation bilaterally, no wheezing, rhonchi or rales  Abd: Soft, nontender, no hepatomegaly  Ext: Bilateral leg amputations Skin: Warm and dry, no rashes   Neuro: Alert and oriented to person, place, time, and situation, CNII-XII grossly intact, no focal deficits  Psych: Normal mood and affect   Labs  High Sensitivity Troponin:  No results for input(s): "TROPONINIHS" in the last 720 hours.   Cardiac EnzymesNo results for input(s): "TROPONINI" in the last 168 hours. No results for input(s): "TROPIPOC" in the last 168 hours.  Chemistry Recent Labs  Lab 03/16/23 1039 03/17/23 0500 03/18/23 1915  NA 136 135 135  K 3.6 3.7 4.0  CL 107 111 107  CO2 21* 19* 20*  GLUCOSE 153* 72 114*  BUN 15 22 21   CREATININE 0.55* 0.66 0.82   CALCIUM 7.8* 7.6* 7.7*  PROT 6.0*  --  6.0*  ALBUMIN 1.8*  --  1.9*  AST 21  --  25  ALT 14  --  19  ALKPHOS 67  --  71  BILITOT 0.5  --  0.6  GFRNONAA >60 >60 >60  ANIONGAP 8 5 8     Hematology Recent Labs  Lab 03/16/23 1039 03/17/23 0812 03/18/23 1915  WBC 22.8* 18.4* 20.2*  RBC 3.06* 3.40* 3.31*  HGB 7.9* 9.2* 8.7*  HCT 25.1* 28.9* 28.1*  MCV 82.0 85.0 84.9  MCH 25.8* 27.1 26.3  MCHC 31.5 31.8 31.0  RDW 17.2* 17.6* 18.5*  PLT 304 354 327   BNPNo results for input(s): "BNP", "PROBNP" in the last 168 hours.  DDimer No results for input(s): "DDIMER" in the last 168 hours.   Radiology  ECHO TEE  Result Date: 03/17/2023    TRANSESOPHOGEAL ECHO REPORT   Patient Name:   Troy [name] [facility] Date of Exam: 03/17/2023 Medical Rec #:  [id number]     Height:       71.0 in Accession #:    [id number]    Weight:       175.0 lb Date of Birth:  08/19/1960     BSA:          1.992 m Patient Age:    62 years      BP:           101/81 mmHg Patient Gender: M             HR:           142 bpm. Exam Location:  Inpatient Procedure: 2D Echo, Color Doppler and Cardiac Doppler Indications:     A flutter  History:         Patient has prior history of Echocardiogram examinations, most                  recent 02/06/2023.  Sonographer:     Harriette Bouillon RDCS Referring Phys:  [id number] Lisette Abu HILTY Diagnosing Phys: Zoila Shutter MD PROCEDURE: After discussion of the risks and benefits of a TEE, an informed consent was obtained from the patient. The transesophogeal probe was passed without difficulty through the esophogus of the patient. Sedation performed by different physician. The patient developed no complications during the procedure.  IMPRESSIONS  1. Left ventricular ejection

## 2023-03-20 ENCOUNTER — Encounter (HOSPITAL_COMMUNITY): Payer: Self-pay | Admitting: Anesthesiology

## 2023-03-20 DIAGNOSIS — I483 Typical atrial flutter: Secondary | ICD-10-CM | POA: Diagnosis not present

## 2023-03-20 LAB — GLUCOSE, CAPILLARY
Glucose-Capillary: 125 mg/dL — ABNORMAL HIGH (ref 70–99)
Glucose-Capillary: 95 mg/dL (ref 70–99)
Glucose-Capillary: 66 mg/dL — ABNORMAL LOW (ref 70–99)
Glucose-Capillary: 66 mg/dL — ABNORMAL LOW (ref 70–99)
Glucose-Capillary: 134 mg/dL — ABNORMAL HIGH (ref 70–99)
Glucose-Capillary: 219 mg/dL — ABNORMAL HIGH (ref 70–99)

## 2023-03-20 MED ORDER — AMIODARONE HCL 200 MG PO TABS
400.0000 mg | ORAL_TABLET | Freq: Two times a day (BID) | ORAL | Status: DC
  Administered 2023-03-20 – 2023-03-24 (×9): 400 mg via ORAL
  Filled 2023-03-20 (×9): qty 2

## 2023-03-20 MED ORDER — AMIODARONE HCL 200 MG PO TABS: 200.0000 mg | ORAL_TABLET | Freq: Every day | ORAL | Status: DC

## 2023-03-20 NOTE — Progress Notes (Signed)
Cardiology Progress Note  Patient ID: LANDREY FELIZ MRN: [id number] DOB: 03/09/1961 Date of Encounter: 03/20/2023  Primary Cardiologist: None  Subjective   Chief Complaint: None.   HPI: Back in [facility] last week. Plan per EP was  IV  amio over the weekend and DCCV Monday. He converted to sinus in the early AM. He feels ok otherwise,would like to go home soon  ROS:  All other ROS reviewed and negative. Pertinent positives noted in the HPI.     Inpatient Medications  Scheduled Meds:  apixaban  5 mg Oral BID   vitamin C  1,000 mg Oral Daily   clopidogrel  75 mg Oral Daily   docusate sodium  100 mg Oral Daily   doxycycline  100 mg Oral Q12H   empagliflozin  10 mg Oral QAC breakfast   feeding supplement  237 mL Oral BID BM   glipiZIDE  10 mg Oral BID   insulin aspart  0-15 Units Subcutaneous TID WC   insulin aspart  4 Units Subcutaneous TID WC   insulin glargine-yfgn  20 Units Subcutaneous Daily   metoprolol succinate  25 mg Oral Daily   nutrition supplement (JUVEN)  1 packet Oral BID BM   pantoprazole  40 mg Oral Daily   rosuvastatin  20 mg Oral Daily   zinc sulfate  220 mg Oral Daily   Continuous Infusions:  sodium chloride Stopped (03/19/23 0459)   amiodarone 30 mg/hr (03/20/23 0639)   magnesium sulfate bolus IVPB     PRN Meds: acetaminophen, alum & mag hydroxide-simeth, bisacodyl, guaiFENesin-dextromethorphan, HYDROmorphone (DILAUDID) injection, magnesium citrate, magnesium sulfate bolus IVPB, ondansetron, oxyCODONE, oxyCODONE, phenol, polyethylene glycol, potassium chloride   Vital Signs   Vitals:   03/19/23 2003 03/20/23 0011 03/20/23 0345 03/20/23 0745  BP: 97/80 107/83 99/69 114/77  Pulse: (!) 130 (!) 129 71 71  Resp: 16 18 18 18   Temp: 98.5 F (36.9 C) 98.3 F (36.8 C) 98.1 F (36.7 C) 98.1 F (36.7 C)  TempSrc: Oral Oral Oral Oral  SpO2: 98% 97% 98% 97%  Weight:      Height:        Intake/Output Summary (Last 24 hours) at 03/20/2023 0943 Last data filed  at 03/20/2023 0014 Gross per 24 hour  Intake 132.56 ml  Output 550 ml  Net -417.44 ml      03/11/2023    7:09 AM 02/23/2023    8:40 AM 02/16/2023    4:05 AM  Last 3 Weights  Weight (lbs) 175 lb 176 lb 173 lb 4.8 oz  Weight (kg) 79.379 kg 79.833 kg 78.608 kg      Telemetry  Sinus rhythm around 2AM which I personally reviewed.   ECG  Sinus rhythm, right axis which I personally reviewed.   Physical Exam   Vitals:   03/19/23 2003 03/20/23 0011 03/20/23 0345 03/20/23 0745  BP: 97/80 107/83 99/69 114/77  Pulse: (!) 130 (!) 129 71 71  Resp: 16 18 18 18   Temp: 98.5 F (36.9 C) 98.3 F (36.8 C) 98.1 F (36.7 C) 98.1 F (36.7 C)  TempSrc: Oral Oral Oral Oral  SpO2: 98% 97% 98% 97%  Weight:      Height:        Intake/Output Summary (Last 24 hours) at 03/20/2023 0943 Last data filed at 03/20/2023 0014 Gross per 24 hour  Intake 132.56 ml  Output 550 ml  Net -417.44 ml       03/11/2023    7:09 AM 02/23/2023  8:40 AM 02/16/2023    4:05 AM  Last 3 Weights  Weight (lbs) 175 lb 176 lb 173 lb 4.8 oz  Weight (kg) 79.379 kg 79.833 kg 78.608 kg    Body mass index is 24.41 kg/m.  General: Well nourished, well developed, in no acute distress. Lying flat comfortably Head: Atraumatic, normal size  Eyes: PEERLA, EOMI  Neck: Supple,  Endocrine: No thryomegaly Cardiac: Normal S1, S2; tachycardia, no murmurs  Lungs: Clear to auscultation bilaterally, no wheezing, rhonchi or rales  Abd: Soft, nontender, no hepatomegaly  Ext: Bilateral leg amputations Skin: Warm and dry, no rashes   Neuro: Alert and oriented to person, place, time, and situation, CNII-XII grossly intact, no focal deficits  Psych: Normal mood and affect   Labs  High Sensitivity Troponin:  No results for input(s): "TROPONINIHS" in the last 720 hours.   Cardiac EnzymesNo results for input(s): "TROPONINI" in the last 168 hours. No results for input(s): "TROPIPOC" in the last 168 hours.  Chemistry Recent Labs  Lab  03/16/23 1039 03/17/23 0500 03/18/23 1915  NA 136 135 135  K 3.6 3.7 4.0  CL 107 111 107  CO2 21* 19* 20*  GLUCOSE 153* 72 114*  BUN 15 22 21   CREATININE 0.55* 0.66 0.82  CALCIUM 7.8* 7.6* 7.7*  PROT 6.0*  --  6.0*  ALBUMIN 1.8*  --  1.9*  AST 21  --  25  ALT 14  --  19  ALKPHOS 67  --  71  BILITOT 0.5  --  0.6  GFRNONAA >60 >60 >60  ANIONGAP 8 5 8     Hematology Recent Labs  Lab 03/16/23 1039 03/17/23 0812 03/18/23 1915  WBC 22.8* 18.4* 20.2*  RBC 3.06* 3.40* 3.31*  HGB 7.9* 9.2* 8.7*  HCT 25.1* 28.9* 28.1*  MCV 82.0 85.0 84.9  MCH 25.8* 27.1 26.3  MCHC 31.5 31.8 31.0  RDW 17.2* 17.6* 18.5*  PLT 304 354 327   BNPNo results for input(s): "BNP", "PROBNP" in the last 168 hours.  DDimer No results for input(s): "DDIMER" in the last 168 hours.   Radiology  No results found.  Cardiac Studies  TEE 03/17/2023  1. Left ventricular ejection fraction, by estimation, is 70 to 75%. The left ventricle has hyperdynamic function. There is moderate left ventricular hypertrophy.   2. Right ventricular systolic function is normal. The right ventricular size is normal.   3. Left atrial size was mildly dilated. No left atrial/left atrial  appendage thrombus was detected.   4. The mitral valve is grossly normal. Trivial mitral valve  regurgitation.   5. The aortic valve is tricuspid. Aortic valve regurgitation is not  visualized. No aortic stenosis is present.   Patient Profile  NAOTO COLINDRES is a 62 y.o. male with PAD status post right SFA stent 01/31/2023, diabetes, hypertension, hyperlipidemia who was admitted on 03/11/2023 for right BKA.  Course complicated by atrial flutter.  Assessment & Plan   # Paroxysmal atrial flutter -Admitted with right BKA due to right foot osteomyelitis.  Course complicated by atrial flutter.  Underwent TEE/cardioversion on 03/17/2023.  Unfortunately went back into atrial flutter last night.  Evaluated by EP.  Not wanting to do ablation this admission.  He converted to sinus rhythm early this AM. Will transition to oral amiodarone today with a taper. If he remains in sinus rhythm today; no plans for DCCV tomorrow. -Continue metoprolol succinate 25 mg daily.  Blood pressure precludes further titration.  Would allow for lenient rate control  over the weekend. -Continue Eliquis 5 mg twice daily.  # PAD # Right SFA stent 01/31/2023 -On Plavix and Eliquis.  No concerns for bleeding per orthopedic surgery.  # Right foot osteomyelitis R BKA amputation -Antibiotics per primary team    If he remains in sinus rhythm, no other plans for him from a cardiology perspective. He has an appoitnment with Azalee Course on 10/18  For questions or updates, please contact [facility] Please consult [email or web address] for contact info under   Maisie Fus

## 2023-03-20 NOTE — Progress Notes (Signed)
Pt converted to NSR, 12L EKG done.

## 2023-03-20 NOTE — Plan of Care (Signed)
  Problem: Pain Management: Goal: Pain level will decrease with appropriate interventions Outcome: Progressing   

## 2023-03-20 NOTE — Anesthesia Preprocedure Evaluation (Signed)
Anesthesia Evaluation    Reviewed: Allergy & Precautions, Patient's Chart, lab work & pertinent test results  History of Anesthesia Complications Negative for: history of anesthetic complications  Airway        Dental   Pulmonary COPD, Current Smoker          Cardiovascular hypertension, Pt. on medications + Peripheral Vascular Disease  + dysrhythmias Atrial Fibrillation    '24 TEE - EF 70 to 75%. Moderate left ventricular hypertrophy. Left atrial size was mildly dilated. Trivial MR    Neuro/Psych  PSYCHIATRIC DISORDERS Anxiety Depression    negative neurological ROS     GI/Hepatic ,GERD  Medicated,,(+) Cirrhosis         Endo/Other  diabetes, Type 2, Insulin Dependent, Oral Hypoglycemic Agents    Renal/GU negative Renal ROS     Musculoskeletal negative musculoskeletal ROS (+)    Abdominal   Peds  Hematology  (+) Blood dyscrasia, anemia  On eliquis and plavix    Anesthesia Other Findings   Reproductive/Obstetrics                             Anesthesia Physical Anesthesia Plan  ASA: 3  Anesthesia Plan: General   Post-op Pain Management: Minimal or no pain anticipated   Induction: Intravenous  PONV Risk Score and Plan: 1 and Treatment may vary due to age or medical condition and Propofol infusion  Airway Management Planned: Natural Airway and Mask  Additional Equipment: None  Intra-op Plan:   Post-operative Plan:   Informed Consent:   Plan Discussed with: CRNA and Anesthesiologist  Anesthesia Plan Comments:         Anesthesia Quick Evaluation

## 2023-03-20 NOTE — Progress Notes (Signed)
Hypoglycemic Event  CBG: 66  Treatment: 8 oz juice/soda  Symptoms: None  Follow-up CBG: Time:1628 CBG Result:66  Follow-up CBG: Time:1705  CBG Result:134  Possible Reasons for Event: Inadequate meal intake Insulin meal coverage  Comments/MD notified:D. Agarwala    Hinton Dyer

## 2023-03-20 NOTE — Progress Notes (Signed)
Patient ID: Troy [name], male   DOB: May 15, 1961, 62 y.o.   MRN: [id number] Patient is status post right below-knee amputation.  Anticipate cardiac intervention on Monday.  There continues to be no drainage in the wound VAC canister.   Samuella Cota, MD [facility], Hand Surgery 10:56 AM

## 2023-03-21 ENCOUNTER — Inpatient Hospital Stay: Payer: Medicare Other | Admitting: Pulmonary Disease

## 2023-03-21 ENCOUNTER — Encounter (HOSPITAL_COMMUNITY)
Admission: RE | Disposition: A | Discharge status: Skilled Nursing Facility | Payer: Self-pay | Source: Home / Self Care | Attending: Orthopedic Surgery

## 2023-03-21 ENCOUNTER — Inpatient Hospital Stay (HOSPITAL_COMMUNITY): Payer: Medicare Other

## 2023-03-21 DIAGNOSIS — I739 Peripheral vascular disease, unspecified: Secondary | ICD-10-CM | POA: Diagnosis not present

## 2023-03-21 DIAGNOSIS — I4892 Unspecified atrial flutter: Secondary | ICD-10-CM

## 2023-03-21 DIAGNOSIS — R0603 Acute respiratory distress: Secondary | ICD-10-CM

## 2023-03-21 DIAGNOSIS — J189 Pneumonia, unspecified organism: Secondary | ICD-10-CM

## 2023-03-21 DIAGNOSIS — E7800 Pure hypercholesterolemia, unspecified: Secondary | ICD-10-CM

## 2023-03-21 LAB — BASIC METABOLIC PANEL
Anion gap: 11 (ref 5–15)
BUN: 27 mg/dL — ABNORMAL HIGH (ref 8–23)
CO2: 21 mmol/L — ABNORMAL LOW (ref 22–32)
Calcium: 8.1 mg/dL — ABNORMAL LOW (ref 8.9–10.3)
Chloride: 106 mmol/L (ref 98–111)
Creatinine, Ser: 0.94 mg/dL (ref 0.61–1.24)
GFR, Estimated: >60 mL/min (ref >60)
Glucose, Bld: 122 mg/dL — ABNORMAL HIGH (ref 70–99)
Potassium: 3.8 mmol/L (ref 3.5–5.1)
Sodium: 138 mmol/L (ref 135–145)

## 2023-03-21 LAB — CBC WITH DIFFERENTIAL/PLATELET
Abs Immature Granulocytes: 0.13 10*3/uL — ABNORMAL HIGH (ref 0.00–0.07)
Basophils Absolute: 0.1 10*3/uL (ref 0.0–0.1)
Basophils Relative: 0 %
Eosinophils Absolute: 0 10*3/uL (ref 0.0–0.5)
Eosinophils Relative: 0 %
HCT: 28.7 % — ABNORMAL LOW (ref 39.0–52.0)
Hemoglobin: 8.8 g/dL — ABNORMAL LOW (ref 13.0–17.0)
Immature Granulocytes: 1 %
Lymphocytes Relative: 8 %
Lymphs Abs: 1.6 10*3/uL (ref 0.7–4.0)
MCH: 26.4 pg (ref 26.0–34.0)
MCHC: 30.7 g/dL (ref 30.0–36.0)
MCV: 86.2 fL (ref 80.0–100.0)
Monocytes Absolute: 0.6 10*3/uL (ref 0.1–1.0)
Monocytes Relative: 3 %
Neutro Abs: 17.5 10*3/uL — ABNORMAL HIGH (ref 1.7–7.7)
Neutrophils Relative %: 88 %
Platelets: 345 10*3/uL (ref 150–400)
RBC: 3.33 MIL/uL — ABNORMAL LOW (ref 4.22–5.81)
RDW: 18.6 % — ABNORMAL HIGH (ref 11.5–15.5)
WBC: 19.9 10*3/uL — ABNORMAL HIGH (ref 4.0–10.5)
nRBC: 0 % (ref 0.0–0.2)

## 2023-03-21 LAB — PROCALCITONIN: Procalcitonin: <0.1 ng/mL

## 2023-03-21 LAB — HIV ANTIBODY (ROUTINE TESTING W REFLEX): HIV Screen 4th Generation wRfx: NONREACTIVE

## 2023-03-21 LAB — BRAIN NATRIURETIC PEPTIDE: B Natriuretic Peptide: 621.5 pg/mL — ABNORMAL HIGH (ref 0.0–100.0)

## 2023-03-21 LAB — GLUCOSE, CAPILLARY
Glucose-Capillary: 134 mg/dL — ABNORMAL HIGH (ref 70–99)
Glucose-Capillary: 84 mg/dL (ref 70–99)
Glucose-Capillary: 101 mg/dL — ABNORMAL HIGH (ref 70–99)
Glucose-Capillary: 111 mg/dL — ABNORMAL HIGH (ref 70–99)

## 2023-03-21 LAB — RESP PANEL BY RT-PCR (RSV, FLU A&B, COVID)  RVPGX2
Influenza A by PCR: NEGATIVE
Influenza B by PCR: NEGATIVE
Resp Syncytial Virus by PCR: NEGATIVE
SARS Coronavirus 2 by RT PCR: NEGATIVE

## 2023-03-21 SURGERY — CARDIOVERSION
Anesthesia: General

## 2023-03-21 MED ORDER — FUROSEMIDE 10 MG/ML IJ SOLN
40.0000 mg | Freq: Once | INTRAMUSCULAR | Status: AC
  Administered 2023-03-21: 40 mg via INTRAVENOUS
  Filled 2023-03-21: qty 4

## 2023-03-21 MED ORDER — SODIUM CHLORIDE 0.9 % IV SOLN
500.0000 mg | INTRAVENOUS | Status: DC
  Administered 2023-03-21: 500 mg via INTRAVENOUS
  Filled 2023-03-21: qty 5

## 2023-03-21 MED ORDER — ROSUVASTATIN CALCIUM 20 MG PO TABS
40.0000 mg | ORAL_TABLET | Freq: Every day | ORAL | Status: DC
  Administered 2023-03-23 – 2023-03-24 (×2): 40 mg via ORAL
  Filled 2023-03-21 (×2): qty 2

## 2023-03-21 MED ORDER — ALBUTEROL SULFATE (2.5 MG/3ML) 0.083% IN NEBU
INHALATION_SOLUTION | RESPIRATORY_TRACT | Status: AC
  Administered 2023-03-21: 2.5 mg
  Filled 2023-03-21: qty 3

## 2023-03-21 MED ORDER — GERHARDT'S BUTT CREAM
TOPICAL_CREAM | Freq: Every day | CUTANEOUS | Status: DC | PRN
  Filled 2023-03-21: qty 1

## 2023-03-21 MED ORDER — SODIUM CHLORIDE 0.9 % IV SOLN
2.0000 g | INTRAVENOUS | Status: DC
  Administered 2023-03-21 – 2023-03-23 (×3): 2 g via INTRAVENOUS
  Filled 2023-03-21 (×4): qty 20

## 2023-03-21 NOTE — Progress Notes (Addendum)
Cardiology Progress Note  Patient ID: Troy [name] MRN: [id number] DOB: [birth year] Date of Encounter: 03/21/2023  Primary Cardiologist: None  Subjective   Chief Complaint: None.   HPI: Back in [facility] last week. Plan per EP was  IV  amio over the weekend and DCCV Monday. He converted to sinus in the early AM 09/29. He feels ok otherwise,would like to go home soon  Resp rate is elevated, but he denies SOB.  ROS:  All other ROS reviewed and negative. Pertinent positives noted in the HPI.     Inpatient Medications  Scheduled Meds:  amiodarone  400 mg Oral BID   Followed by   Melene Muller ON 03/30/2023] amiodarone  200 mg Oral Daily   apixaban  5 mg Oral BID   vitamin C  1,000 mg Oral Daily   clopidogrel  75 mg Oral Daily   docusate sodium  100 mg Oral Daily   doxycycline  100 mg Oral Q12H   empagliflozin  10 mg Oral QAC breakfast   feeding supplement  237 mL Oral BID BM   glipiZIDE  10 mg Oral BID   insulin aspart  0-15 Units Subcutaneous TID WC   insulin aspart  4 Units Subcutaneous TID WC   insulin glargine-yfgn  20 Units Subcutaneous Daily   metoprolol succinate  25 mg Oral Daily   nutrition supplement (JUVEN)  1 packet Oral BID BM   pantoprazole  40 mg Oral Daily   rosuvastatin  20 mg Oral Daily   zinc sulfate  220 mg Oral Daily   Continuous Infusions:  sodium chloride Stopped (03/19/23 0459)   magnesium sulfate bolus IVPB     PRN Meds: acetaminophen, alum & mag hydroxide-simeth, bisacodyl, guaiFENesin-dextromethorphan, HYDROmorphone (DILAUDID) injection, magnesium citrate, magnesium sulfate bolus IVPB, ondansetron, oxyCODONE, oxyCODONE, phenol, polyethylene glycol, potassium chloride   Vital Signs   Vitals:   03/20/23 1952 03/21/23 0005 03/21/23 0348 03/21/23 0759  BP: 108/71 (!) 110/56 102/61 (!) 164/66  Pulse: 67 65 62 79  Resp: 18 20 20 20   Temp: 98 F (36.7 C) 97.8 F (36.6 C) 98.2 F (36.8 C) 98.6 F (37 C)  TempSrc: Oral Oral Oral Oral  SpO2: 93% 93% 96% 95%   Weight:      Height:        Intake/Output Summary (Last 24 hours) at 03/21/2023 0810 Last data filed at 03/21/2023 0000 Gross per 24 hour  Intake 560.37 ml  Output --  Net 560.37 ml      03/11/2023    7:09 AM 02/23/2023    8:40 AM 02/16/2023    4:05 AM  Last 3 Weights  Weight (lbs) 175 lb 176 lb 173 lb 4.8 oz  Weight (kg) 79.379 kg 79.833 kg 78.608 kg      Telemetry  Sinus rhythm around 2AM 09/29 which I personally reviewed.   ECG  Sinus rhythm, right axis since his spontaneous cardioversion: personally reviewed.   Physical Exam   Vitals:   03/20/23 1952 03/21/23 0005 03/21/23 0348 03/21/23 0759  BP: 108/71 (!) 110/56 102/61 (!) 164/66  Pulse: 67 65 62 79  Resp: 18 20 20 20   Temp: 98 F (36.7 C) 97.8 F (36.6 C) 98.2 F (36.8 C) 98.6 F (37 C)  TempSrc: Oral Oral Oral Oral  SpO2: 93% 93% 96% 95%  Weight:      Height:        Intake/Output Summary (Last 24 hours) at 03/21/2023 0810 Last data filed at 03/21/2023 0000 Gross per 24  hour  Intake 560.37 ml  Output --  Net 560.37 ml       03/11/2023    7:09 AM 02/23/2023    8:40 AM 02/16/2023    4:05 AM  Last 3 Weights  Weight (lbs) 175 lb 176 lb 173 lb 4.8 oz  Weight (kg) 79.379 kg 79.833 kg 78.608 kg    Body mass index is 24.41 kg/m.  General: Well nourished, well developed, in no acute distress.  Head: Atraumatic, normal size  Eyes: PEERLA, EOMI  Neck: Supple, JVD 10 cm Endocrine: No thryomegaly Cardiac: Normal S1, S2; tachycardia, no murmurs  Lungs: decreased BS bases bilaterally, no wheezing, rhonchi or rales  Abd: Soft, nontender, no hepatomegaly  Ext: Bilateral leg amputations, R one recent Skin: Warm and dry, no rashes   Neuro: Alert and oriented to person, place, time, and situation, CNII-XII grossly intact, no focal deficits  Psych: Normal mood and affect   Labs  High Sensitivity Troponin:  No results for input(s): "TROPONINIHS" in the last 720 hours.   Cardiac EnzymesNo results for input(s):  "TROPONINI" in the last 168 hours. No results for input(s): "TROPIPOC" in the last 168 hours.  Chemistry Recent Labs  Lab 03/16/23 1039 03/17/23 0500 03/18/23 1915  NA 136 135 135  K 3.6 3.7 4.0  CL 107 111 107  CO2 21* 19* 20*  GLUCOSE 153* 72 114*  BUN 15 22 21   CREATININE 0.55* 0.66 0.82  CALCIUM 7.8* 7.6* 7.7*  PROT 6.0*  --  6.0*  ALBUMIN 1.8*  --  1.9*  AST 21  --  25  ALT 14  --  19  ALKPHOS 67  --  71  BILITOT 0.5  --  0.6  GFRNONAA >60 >60 >60  ANIONGAP 8 5 8     Hematology Recent Labs  Lab 03/16/23 1039 03/17/23 0812 03/18/23 1915  WBC 22.8* 18.4* 20.2*  RBC 3.06* 3.40* 3.31*  HGB 7.9* 9.2* 8.7*  HCT 25.1* 28.9* 28.1*  MCV 82.0 85.0 84.9  MCH 25.8* 27.1 26.3  MCHC 31.5 31.8 31.0  RDW 17.2* 17.6* 18.5*  PLT 304 354 327   BNPNo results for input(s): "BNP", "PROBNP" in the last 168 hours.  DDimer No results for input(s): "DDIMER" in the last 168 hours.   Radiology  No results found.  Cardiac Studies  TEE 03/17/2023  1. Left ventricular ejection fraction, by estimation, is 70 to 75%. The left ventricle has hyperdynamic function. There is moderate left ventricular hypertrophy.   2. Right ventricular systolic function is normal. The right ventricular size is normal.   3. Left atrial size was mildly dilated. No left atrial/left atrial  appendage thrombus was detected.   4. The mitral valve is grossly normal. Trivial mitral valve  regurgitation.   5. The aortic valve is tricuspid. Aortic valve regurgitation is not  visualized. No aortic stenosis is present.   Patient Profile  Troy [name] is a 62 y.o. male with PAD status post right SFA stent 01/31/2023, diabetes, hypertension, hyperlipidemia who was admitted on 03/11/2023 for right BKA.  Course complicated by atrial flutter.  Assessment & Plan   # Paroxysmal atrial flutter -Admitted with right BKA due to right foot osteomyelitis.  Course complicated by atrial flutter.  Underwent TEE/cardioversion on  03/17/2023.  Unfortunately went back into atrial flutter last night.  Evaluated by EP.  Not wanting to do ablation this admission. He converted to sinus rhythm early 09/29 AM. Now on oral amiodarone at 400 mg bid till  10/09, then 200 mg daily. -Continue metoprolol succinate 25 mg daily.  Blood pressure precludes further titration.  Would allow for lenient rate control over the weekend. -Continue Eliquis 5 mg twice daily.  # PAD # Right SFA stent 01/31/2023 -On Plavix and Eliquis.  No concerns for bleeding per orthopedic surgery.  # Right foot osteomyelitis R BKA amputation -Antibiotics per primary team    # SOB - Pt denies but resp rate elevated as is JVD, check CXR  If he remains in sinus rhythm and CXR ok, no other plans for him from a cardiology perspective. He has an appoitnment with [facility] Course on 10/18  For questions or updates, please contact [facility] Please consult [email or web address] for contact info under   Theodore Demark, PA-C 03/21/2023 8:14 AM

## 2023-03-21 NOTE — Consult Note (Signed)
Initial Consultation Note   Patient: Troy [name] DOB: [birth year] PCP: Pcp, No DOA: 03/11/2023 DOS: the patient was seen and examined on 03/21/2023 Primary service: Nadara Mustard, MD  Referring physician: Aldean Baker, MD Reason for consult: Acute hypoxic respiratory failure  Assessment and Plan: Acute hypoxic respiratory failure: Increased work of breathing today with SpO2 down to 89%, currently on BiPAP with improved respiratory status.  CXR this morning showed multifocal pneumonia and/or pulmonary edema. -S/p IV Lasix 40 mg once -Monitor strict I/Os, bladder scan and In-N-Out cath as needed -Start IV ceftriaxone and azithromycin -Continue BiPAP prn and wean to nasal cannula as able -Check COVID, flu, strep pneumonia urinary antigen -CBC, BMP, BNP, procalcitonin  Paroxysmal atrial flutter: Postop course complicated by atrial flutter s/p DCCV 9/26, temporarily in sinus rhythm before going back into atrial flutter but has since converted back to sinus. -Cardiology signed off today -Continue amiodarone and Toprol-XL -Continue Eliquis  Chronic HFpEF: Diuretics EF is 70-75%.  He does not require at baseline.  S/p IV Lasix 40 mg due to concern for pulmonary edema on CXR today. 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  Continue rosuvastatin, Plavix, Eliquis.  Insulin-dependent type 2 diabetes: Controlled on current regimen.  Continue [name] and insulin as ordered.  TRH will continue to follow the patient.  HPI:  Troy [name] is a 62 y.o. male with past medical history of HFpEF (EF 70-75%), paroxysmal A-fib/flutter on Eliquis, COPD, insulin-dependent T2DM, HTN, HLD, PVD s/p left BKA who was admitted on 9/20 with dehiscence of right transmetatarsal amputation s/p right BKA 9/20.  Hospitalization was complicated by atrial flutter.  He underwent TEE  cardioversion 9/26.  Converted to sinus rhythm but went back into atrial flutter then converted back to sinus rhythm on amiodarone last night.  Cardiology had been following, patient declined ablation.  States that last night he developed a rattling nonproductive cough.  This morning he had increased work of breathing.  Cardiology ordered a CXR this morning and have signed off.  Chest x-ray showed new left upper and right basilar opacities concerning for possible multifocal pneumonia versus pulmonary edema.  Minimal bilateral pleural effusions noted.  This evening patient developed significantly increased work of breathing.  Rapid response were called and he was placed on nonrebreather and subsequently BiPAP.  Cardiology was recontacted and ordered IV Lasix 40 mg.  The hospitalist service was consulted to assist with management of acute hypoxic respiratory failure.  Review of Systems: As mentioned in the history of present illness. All other systems reviewed and are negative. Past Medical History:  Diagnosis Date   Acute renal failure (HCC)    in setting of NSAID use and orthopedic surgery 2010   Anxiety and depression    Chronic diastolic CHF (congestive heart failure) (HCC)    a. Echo 6/17: severe conc LVH, vigorous EF, EF 65-70%, no dynamic obstruction, no RWMA, Gr 1 DD, mild TR  //  b. LHC 8/17: no sig CAD, LVEDP 28   Cirrhosis of liver (HCC)    COPD (chronic obstructive pulmonary disease) (HCC)    Diabetic ulcer of left foot (HCC)    DM2 (diabetes mellitus, type 2) (HCC)    Dysrhythmia    Family history of early CAD    GERD (gastroesophageal reflux disease)    History of amputation of foot (HCC)    L trans-met // R  toe   History of cardiac catheterization    a. LHC 2002: irregs  //  b. LHC in 8/17: no sig CAD, apical DK, hyperdynamic LV, LVEDP 28   History of kidney stones    History of nuclear stress test    a. Nuc 7/17: Overall, intermediate risk nuclear stress test secondary to  small size of apical lateral defect and reduced ejection fraction.  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    Kidney calculi    Palpitations    Pneumonia    PTSD (post-traumatic stress disorder)    Tobacco abuse    Past Surgical History:  Procedure Laterality Date   ABDOMINAL AORTOGRAM W/LOWER EXTREMITY N/A 01/31/2023   Procedure: ABDOMINAL AORTOGRAM W/LOWER EXTREMITY;  Surgeon: Maeola Harman, MD;  Location: [facility] INVASIVE CV LAB;  Service: Cardiovascular;  Laterality: N/A;   AMPUTATION Left 01/03/2018   Procedure: LEFT MIDFOOT AMPUTATION/REVISION MIDAMPUTAION;  Surgeon: Kathryne Hitch, MD;  Location: [facility] OR;  Service: Orthopedics;  Laterality: Left;   AMPUTATION Left 01/25/2018   Procedure: LEFT BELOW KNEE AMPUTATION;  Surgeon: Nadara Mustard, MD;  Location: [facility] OR;  Service: Orthopedics;  Laterality: Left;   AMPUTATION Left 01/21/2021   Procedure: LEFT ABOVE KNEE AMPUTATION;  Surgeon: Nadara Mustard, MD;  Location: [facility] OR;  Service: Orthopedics;  Laterality: Left;   AMPUTATION Right 02/09/2023   Procedure: RIGHT TRANSMETATARSAL AMPUTATION;  Surgeon: Nadara Mustard, MD;  Location: [facility] OR;  Service: Orthopedics;  Laterality: Right;   AMPUTATION Right 03/11/2023   Procedure: RIGHT BELOW KNEE AMPUTATION;  Surgeon: Nadara Mustard, MD;  Location: [facility] OR;  Service: Orthopedics;  Laterality: Right;   AMPUTATION TOE Right 07/17/2019   Procedure: AMPUTATION RIGHT FOOT 2ND TOE;  Surgeon: Kathryne Hitch, MD;  Location: [facility];  Service: Orthopedics;  Laterality: Right;   APPLICATION OF WOUND VAC Left 01/21/2021   Procedure: APPLICATION OF WOUND VAC;  Surgeon: Nadara Mustard, MD;  Location: [facility] OR;  Service: Orthopedics;  Laterality: Left;   BELOW KNEE LEG AMPUTATION Left 01/25/2018   CARDIAC CATHETERIZATION N/A 01/22/2016   Procedure: Left Heart Cath and  Coronary Angiography;  Surgeon: Peter M [place], MD;  Location: [facility] INVASIVE CV LAB;  Service: Cardiovascular;  Laterality: N/A;   CARDIOVERSION N/A 03/17/2023   Procedure: CARDIOVERSION;  Surgeon: Chrystie Nose, MD;  Location: [facility] INVASIVE CV LAB;  Service: Cardiovascular;  Laterality: N/A;   FOOT AMPUTATION Bilateral    I & D EXTREMITY Left 12/15/2017   Procedure: IRRIGATION AND DEBRIDEMENT LEFT FOOT ULCER;  Surgeon: Kathryne Hitch, MD;  Location: [facility] ORS;  Service: Orthopedics;  Laterality: Left;   I & D EXTREMITY Left 07/25/2020   Procedure: LEFT BELOW KNEE AMPUTATION ABSCESS EXCISION AND SKIN GRAFT;  Surgeon: Nadara Mustard, MD;  Location: [facility] OR;  Service: Orthopedics;  Laterality: Left;   I & D EXTREMITY Left 08/22/2020   Procedure: DEBRIDEMENT LEFT BELOW KNEE AMPUTATION AND APPLY KERECIS SKIN GRAFT;  Surgeon: Nadara Mustard, MD;  Location: [facility] OR;  Service: Orthopedics;  Laterality: Left;   LITHOTRIPSY     PERIPHERAL VASCULAR INTERVENTION  01/31/2023   Procedure: PERIPHERAL VASCULAR INTERVENTION;  Surgeon: Maeola Harman, MD;  Location: [facility] INVASIVE CV LAB;  Service: Cardiovascular;;   TEE WITHOUT CARDIOVERSION N/A 03/17/2023   Procedure:  TRANSESOPHAGEAL ECHOCARDIOGRAM;  Surgeon: Chrystie Nose, MD;  Location: [facility] INVASIVE CV LAB;  Service: Cardiovascular;  Laterality: N/A;   TENDON LENGTHENING Bilateral    calf   TONSILLECTOMY     Social History:  reports that he has been smoking cigarettes. He has a 40 pack-year smoking history. He has never used smokeless tobacco. He reports that he does not drink alcohol and does not use drugs.  Allergies  Allergen Reactions   Hydromorphone Hcl Er Anaphylaxis and Other (See Comments)    Allergic to DYE in extended-release tablet, can tolerate other forms of hydromorphone   Tapentadol Anaphylaxis, Swelling and Other (See Comments)    THROAT ANGIOEDEMA Nucynta [Tapentadol Hydrochloride]   Exalamide Other (See Comments)    UNSPECIFIED  REACTION     Family History  Problem Relation Age of Onset   Leukemia Mother 26       died   Lung cancer Father 2       died   Heart attack Brother 32   Heart attack Brother 35   Hypertension Brother        X3   Hypertension Sister        X2   Diabetes Sister    Stroke Sister    Diabetes Sister    Other Brother        Set designer accident    Prior to Admission medications   Medication Sig Start Date End Date Taking? Authorizing Provider  amoxicillin-clavulanate (AUGMENTIN) 500-125 MG tablet Take 1 tablet by mouth 3 (three) times daily. 03/04/23  Yes Adonis Huguenin, NP  apixaban (ELIQUIS) 5 MG TABS tablet Take 1 tablet (5 mg total) by mouth 2 (two) times daily. 02/16/23 05/17/23 Yes [place], Alvira Philips, DO  clopidogrel (PLAVIX) 75 MG tablet Take 1 tablet (75 mg total) by mouth daily. 01/31/23 01/31/24 Yes Maeola Harman, MD  doxycycline (VIBRA-TABS) 100 MG tablet Take 1 tablet (100 mg total) by mouth 2 (two) times daily. 03/15/23  Yes Nadara Mustard, MD  empagliflozin ([name]) 10 MG TABS tablet Take 1 tablet (10 mg total) by mouth daily before breakfast. 02/23/23 08/22/23 Yes Meng, Wynema Birch, PA  glipiZIDE (GLUCOTROL) 10 MG tablet Take 10 mg by mouth 2 (two) times daily. 03/03/23  Yes [provider]  insulin degludec (TRESIBA) 100 UNIT/ML FlexTouch Pen Inject 40 Units into the skin daily. Patient taking differently: Inject 40-42 Units into the skin daily. 02/16/23 05/17/23 Yes [place], Eric J, DO  insulin lispro (HUMALOG) 100 UNIT/ML KwikPen Inject 18 Units into the skin 3 (three) times daily. Patient taking differently: Inject 18-22 Units into the skin 3 (three) times daily. 02/16/23 05/17/23 Yes [place], Eric J, DO  pantoprazole (PROTONIX) 40 MG tablet Take 1 tablet (40 mg total) by mouth daily. 02/17/23 05/18/23 Yes [place], Eric J, DO  rosuvastatin (CRESTOR) 20 MG tablet Take 1 tablet (20 mg total) by mouth daily. 02/17/23 05/18/23 Yes [place], Alvira Philips, DO  Continuous Glucose Receiver  (FREESTYLE LIBRE 3 READER) DEVI Use as directed with lifestyle libre sensors 02/16/23   [place], Alvira Philips, DO  Continuous Glucose Sensor (FREESTYLE LIBRE 3 SENSOR) MISC Place 1 sensor on the skin every 14 days. Use to check glucose continuously 02/16/23   [place], Alvira Philips, DO  Insulin Pen Needle (PEN NEEDLES 3/16") 31G X 5 MM MISC Use as directed with insulin pen 02/16/23   [place], Alvira Philips, DO  oxyCODONE-acetaminophen (PERCOCET) 10-325 MG tablet Take 1 tablet by mouth every 4 (four) hours as needed for  pain. 03/15/23   Nadara Mustard, MD    Physical Exam: Vitals:   03/21/23 0348 03/21/23 0759 03/21/23 1231 03/21/23 1655  BP: 102/61 (!) 164/66 (!) 119/50 (!) 184/84  Pulse: 62 79 70 74  Resp: 20 20 (!) 22 (!) 24  Temp: 98.2 F (36.8 C) 98.6 F (37 C) 98.2 F (36.8 C) 97.7 F (36.5 C)  TempSrc: Oral Oral Oral Axillary  SpO2: 96% 95% 93% 99%  Weight:      Height:       General: resting in bed wearing BiPAP, appears comfortable and speaking in full sentences on BiPAP HEENT: EOMI, no scleral icterus Cardiac: RRR, no rubs, murmurs or gallops Pulm: clear to auscultation bilaterally while on BiPAP, no significant wheezing noted Abd: soft, nontender, nondistended Ext: S/p bilateral BKA, wound VAC in place RLE Neuro: alert and oriented X3 Data Reviewed:   There are no new results to review at this time.    Family Communication: Spouse at bedside Primary team communication: Discussed with Dr. Lajoyce Corners Thank you very much for involving [name] in the care of your patient.  Author: Darreld Mclean, MD 03/21/2023 7:09 PM  For on call review [email or web address].

## 2023-03-21 NOTE — Significant Event (Signed)
Rapid Response Event Note   Reason for Call :  Respiratory distress  Initial Focused Assessment:  Increased work of breathing.  He is Alert and oriented but unable to say more than one word at a time.  Lung sounds decreased through out.  No stridor. He is cold and clammy.  BP 180/117  HR 76  RR 35-40  O2 sat 98% on [place].  Temp 97.7 axillary CBG 101  Cardiology came to bedside Hospitalist consulted  Interventions:  Placed pt on Bipap 40 mg Lasix IV  Reassessment He looks much better after about an hour on bipap.  He states that he is breathing better but is not back to baseline. 143/77  HR 67  Plan of Care:  RN to call if patient becomes distressed again.  Event Summary:   MD Notified: Dr Lajoyce Corners, Dr Cristal Deer Call Time: 1708 Arrival Time: 1713 End Time: 1900  Marcellina Millin, RN

## 2023-03-21 NOTE — Progress Notes (Signed)
Patient ID: Troy [name], male   DOB: 04-20-1961, 62 y.o.   MRN: [id number] Patient's heart rate is converted to normal sinus rhythm.  There is no drainage in the wound VAC canister.  No cardiac intervention scheduled at this time.  Discharge to skilled nursing when bed is available.  Physical therapy to mobilize patient.

## 2023-03-21 NOTE — Plan of Care (Signed)
  Problem: Pain Management: Goal: Pain level will decrease with appropriate interventions Outcome: Progressing   Problem: Clinical Measurements: Goal: Ability to maintain clinical measurements within normal limits will improve Outcome: Progressing Goal: Will remain free from infection Outcome: Progressing Goal: Diagnostic test results will improve Outcome: Progressing Goal: Respiratory complications will improve Outcome: Progressing Goal: Cardiovascular complication will be avoided Outcome: Progressing

## 2023-03-21 NOTE — TOC Progression Note (Addendum)
Transition of Care [facility]) - Progression Note    Patient Details  Name: Troy [name] MRN: [id number] Date of Birth: 05-Sep-1960  Transition [facility]) CM/SW Contact  Delilah Shan, LCSWA Phone Number: 03/21/2023, 12:11 PM  Clinical Narrative:     CSW spoke with patients spouse regarding SNF bed offers for patient. Patients spouse went to view [facility]. Patients spouse request CSW to fax out further for possible SNF placement for patient. CSW faxed out further. CSW will continue to follow and assist with patients dc planning needs.  Update-4:35pm-CSW spoke with patients spouse and provided medicare compare ratings list with accepted SNF bed offers. Patients spouse request to review and will follow up with CSW with SNF choice. CSW awaiting current PT notes to start insurance authorization for patient. PT informed.  Expected Discharge Plan: Skilled Nursing Facility Barriers to Discharge: Other (must enter comment) (Pt not working with PT, pt wants to go home, wife refusing to receive pt)  Expected Discharge Plan and Services In-house Referral: Clinical Social Work Discharge Planning Services: CM Consult Post Acute Care Choice: Skilled Nursing Facility Living arrangements for the past 2 months: Single Family Home Expected Discharge Date: 03/15/23                                     Social Determinants of Health (SDOH) Interventions SDOH Screenings   Food Insecurity: No Food Insecurity (03/11/2023)  Housing: Low Risk  (03/11/2023)  Transportation Needs: No Transportation Needs (03/11/2023)  Utilities: Not At Risk (03/11/2023)  Financial Resource Strain: Low Risk  (04/09/2021)   Received from [facility], [facility]  Physical Activity: Insufficiently Active (04/09/2021)   Received from [facility], [facility]  Social Connections: Unknown (10/22/2021)   Received from [facility], [facility]  Stress: No Stress Concern Present (04/09/2021)   Received from  [facility], [facility]  Tobacco Use: High Risk (03/11/2023)    Readmission Risk Interventions    08/25/2021   12:46 PM  Readmission Risk Prevention Plan  Transportation Screening Complete  Medication Review (RN Care Manager) Complete  PCP or Specialist appointment within 3-5 days of discharge Complete  HRI or Home Care Consult Complete  SW Recovery Care/Counseling Consult Complete  Palliative Care Screening Not Applicable  Skilled Nursing Facility Complete

## 2023-03-21 NOTE — Care Management Important Message (Signed)
Important Message  Patient Details  Name: Troy [name] MRN: [id number] Date of Birth: [birth year]   Important Message Given:  Yes - Medicare IM     Sherilyn Banker 03/21/2023, 12:36 PM

## 2023-03-21 NOTE — Inpatient Diabetes Management (Signed)
Inpatient Diabetes Program Recommendations  AACE/ADA: New Consensus Statement on Inpatient Glycemic Control  Target Ranges:  Prepandial:   less than 140 mg/dL      Peak postprandial:   less than 180 mg/dL (1-2 hours)      Critically ill patients:  140 - 180 mg/dL    Latest Reference Range & Units 03/20/23 07:44 03/20/23 12:13 03/20/23 16:06 03/20/23 16:28 03/20/23 17:05 03/20/23 21:48 03/21/23 07:58  Glucose-Capillary 70 - 99 mg/dL 161 (H) 95 66 (L) 66 (L) 134 (H) 219 (H) 134 (H)    Latest Reference Range & Units 02/05/23 03:28  Hemoglobin A1C 4.8 - 5.6 % 11.7 (H)   Review of Glycemic Control  Diabetes history: DM2 Outpatient Diabetes medications: Tresiba 40 units daily, Humalog 18-22 units TID, [name] 10 mg daily, Glipizide 10 mg BID Current orders for Inpatient glycemic control: Semglee 20 units daily, Novolog 0-15 units TID, Novolog 4 units TID with, [name] 10 mg daily, Glipizide 10 mg BID  Inpatient Diabetes Program Recommendations:    Oral DM medications: Noted CBG down to 66 mg/dl at 09:60 on 4/54/09. While inpatient, please consider discontinuing Glipizide.  HbgA1C: A1C 11.7% on 02/05/23 indicating an average glucose of 289 mg/dl.  Inpatient diabetes coordinator spoke with patient and his wife several times during hospitalization during 02/05/23-02/16/23; pt was not taking any DM medication prior to that admission (had been on insulin in the past) and was discharged on Tresiba and Humalog insulin.   Thanks, Orlando Penner, RN, MSN, CDCES Diabetes Coordinator Inpatient Diabetes Program [phone] (Team Pager from 8am to 5pm)

## 2023-03-21 NOTE — Significant Event (Signed)
Called by bedside nurse for worsening respiratory status. Reviewed chart, cardiology team signed off this AM. Per nursing team, breathing has been worsening throughout the day. Chest x ray this AM read as possible multifocal pneumonia. Rapid response team had been called, patient was on bipap on our arrival and reported improvement in symptoms.  Exam notable for diffuse coarseness throughout lung fields. He is in sinus rhythm, rate controlled. BP elevated.  Will give 40 mg IV lasix now. Recommended contacting Dr. Sunny Schlein team as patient may require transfer to ICU and initiation of antibiotics.  Jodelle Red, MD, PhD, [facility] [place]  [facility]  [place]  [facility] & Vascular at [facility] at [facility] [address] [place], [place] [address] [phone]

## 2023-03-22 ENCOUNTER — Inpatient Hospital Stay (HOSPITAL_COMMUNITY): Payer: Medicare Other

## 2023-03-22 DIAGNOSIS — J9601 Acute respiratory failure with hypoxia: Secondary | ICD-10-CM | POA: Diagnosis not present

## 2023-03-22 LAB — CBC
HCT: 29.1 % — ABNORMAL LOW (ref 39.0–52.0)
Hemoglobin: 8.8 g/dL — ABNORMAL LOW (ref 13.0–17.0)
MCH: 25.4 pg — ABNORMAL LOW (ref 26.0–34.0)
MCHC: 30.2 g/dL (ref 30.0–36.0)
MCV: 83.9 fL (ref 80.0–100.0)
Platelets: 330 10*3/uL (ref 150–400)
RBC: 3.47 MIL/uL — ABNORMAL LOW (ref 4.22–5.81)
RDW: 18.6 % — ABNORMAL HIGH (ref 11.5–15.5)
WBC: 15.1 10*3/uL — ABNORMAL HIGH (ref 4.0–10.5)
nRBC: 0 % (ref 0.0–0.2)

## 2023-03-22 LAB — GLUCOSE, CAPILLARY
Glucose-Capillary: 107 mg/dL — ABNORMAL HIGH (ref 70–99)
Glucose-Capillary: 130 mg/dL — ABNORMAL HIGH (ref 70–99)
Glucose-Capillary: 144 mg/dL — ABNORMAL HIGH (ref 70–99)
Glucose-Capillary: 142 mg/dL — ABNORMAL HIGH (ref 70–99)

## 2023-03-22 LAB — BASIC METABOLIC PANEL
Anion gap: 10 (ref 5–15)
BUN: 24 mg/dL — ABNORMAL HIGH (ref 8–23)
CO2: 20 mmol/L — ABNORMAL LOW (ref 22–32)
Calcium: 8 mg/dL — ABNORMAL LOW (ref 8.9–10.3)
Chloride: 109 mmol/L (ref 98–111)
Creatinine, Ser: 0.86 mg/dL (ref 0.61–1.24)
GFR, Estimated: >60 mL/min (ref >60)
Glucose, Bld: 101 mg/dL — ABNORMAL HIGH (ref 70–99)
Potassium: 3.7 mmol/L (ref 3.5–5.1)
Sodium: 139 mmol/L (ref 135–145)

## 2023-03-22 LAB — PROCALCITONIN: Procalcitonin: <0.1 ng/mL

## 2023-03-22 MED ORDER — FUROSEMIDE 40 MG PO TABS
40.0000 mg | ORAL_TABLET | Freq: Every day | ORAL | Status: AC
  Administered 2023-03-22 – 2023-03-24 (×3): 40 mg via ORAL
  Filled 2023-03-22 (×3): qty 1

## 2023-03-22 MED ORDER — ACETAMINOPHEN 500 MG PO TABS: 500.0000 mg | ORAL_TABLET | Freq: Four times a day (QID) | ORAL | Status: DC | PRN

## 2023-03-22 MED ORDER — ACETAMINOPHEN 325 MG PO TABS: 650.0000 mg | ORAL_TABLET | Freq: Four times a day (QID) | ORAL | Status: DC | PRN

## 2023-03-22 NOTE — Progress Notes (Signed)
Troy [name]  JWJ:[id number] DOB: 12/01/1960 DOA: 03/11/2023 PCP: Pcp, No    Brief Narrative:  62 year old with a history of chronic diastolic CHF, paroxysmal atrial fibrillation/flutter on chronic Eliquis, COPD, DM2, HTN, HLD, and PVD status post L BKA who was admitted to the orthopedic service 9/20 with dehiscence of a right transmetatarsal amputation wound and ultimately required right BKA.  His hospital stay has been complicated by atrial flutter followed by cardiology.  The patient underwent TEE cardioversion 9/26 and converted to sinus rhythm.  He is on amiodarone.  The morning of 9/30 he began to experience increased work of breathing and a nonproductive cough.  CXR suggested new left upper and right basilar opacities worrisome for pneumonia versus edema.  The patient's respiratory status declined over the course of the day with him ultimately requiring BiPAP support.  Goals of Care:   Code Status: Full Code   DVT prophylaxis: SCD's Start: 03/11/23 1124 apixaban (ELIQUIS) tablet 5 mg   Interim Hx: The patient has improved nicely overnight with oxygen saturations presently 93% on room air.  Vital signs are stable and he is afebrile.   Assessment & Plan:  Acute hypoxic respiratory failure - pneumonia versus pulmonary edema v/s other  As defined by significant dyspnea and oxygen saturation of 89% on room air - responded promptly to BiPAP support -continue empiric antibiotic therapy for now -patient is COVID and influenza negative -of note the patient had a similar episode in August at the time of his TMA surgery which was responsive to steroid therapy and thought to possibly be related to amiodarone induced lung toxicity versus CHF and was treated with a very slow prednisone taper over 3 weeks -the patient has not been afebrile though he has had an elevated white count -procalcitonin of <0.10 is strongly suggestive of a noninfectious etiology such -we will continue current antibiotics another  24 hours and repeat CXR in a.m. -she I would have a low threshold to consider stopping antibiotics altogether 10/2 if the patient continues to improve clinically -of note amiodarone has also been resumed by cardiology so we must consider if this is playing a role - for now the patient is much improved clinically  Paroxysmal atrial flutter Care per cardiology -patient declined ablation -ultimately converted to NSR status post DCCV 9/26 -continue Toprol and Eliquis -of note amiodarone has also been resumed by cardiology  Chronic diastolic congestive heart failure EF noted to be 70-75% this admission -does not require diuresis at baseline -does not appear volume overloaded on clinical exam at this time  PAD -right BKA 03/11/2023 -left BKA 01/21/2021 Postop wound care per orthopedics -continue rosuvastatin Plavix and Eliquis  DM2 CBG presently well-controlled  Family Communication: No family present at time of exam Disposition: Anticipate stability for discharge to SNF 10/2 or 10/3 -given that medical issues other reason this patient's hospital stay has been extended [facility] has assumed care of the patient as of today with Ortho continuing to follow as a consult   Objective: Blood pressure 132/62, pulse 73, temperature 98 F (36.7 C), temperature source Oral, resp. rate 20, height 5\' 11"  (1.803 m), weight 79.4 kg, SpO2 95%.  Intake/Output Summary (Last 24 hours) at 03/22/2023 1001 Last data filed at 03/22/2023 0740 Gross per 24 hour  Intake 359.62 ml  Output 401 ml  Net -41.38 ml   Filed Weights   03/11/23 0709  Weight: 79.4 kg    Examination: General: No acute respiratory distress Lungs: Clear to auscultation bilaterally with  exception to mild bibasilar crackles Cardiovascular: Regular rate and rhythm without murmur gallop or rub normal S1 and S2 Abdomen: Nontender, nondistended, soft, bowel sounds positive, no rebound, no ascites, no appreciable mass Extremities: No significant peripheral  edema  CBC: Recent Labs  Lab 03/18/23 1915 03/21/23 2043 03/22/23 0458  WBC 20.2* 19.9* 15.1*  NEUTROABS  --  17.5*  --   HGB 8.7* 8.8* 8.8*  HCT 28.1* 28.7* 29.1*  MCV 84.9 86.2 83.9  PLT 327 345 330   Basic Metabolic Panel: Recent Labs  Lab 03/18/23 1915 03/21/23 2043 03/22/23 0458  NA 135 138 139  K 4.0 3.8 3.7  CL 107 106 109  CO2 20* 21* 20*  GLUCOSE 114* 122* 101*  BUN 21 27* 24*  CREATININE 0.82 0.94 0.86  CALCIUM 7.7* 8.1* 8.0*  MG 1.7  --   --    GFR: Estimated Creatinine Clearance: 93.6 mL/min (by C-G formula based on SCr of 0.86 mg/dL).   Scheduled Meds:  amiodarone  400 mg Oral BID   Followed by   Melene Muller ON 03/30/2023] amiodarone  200 mg Oral Daily   apixaban  5 mg Oral BID   vitamin C  1,000 mg Oral Daily   clopidogrel  75 mg Oral Daily   docusate sodium  100 mg Oral Daily   doxycycline  100 mg Oral Q12H   empagliflozin  10 mg Oral QAC breakfast   feeding supplement  237 mL Oral BID BM   insulin aspart  0-15 Units Subcutaneous TID WC   insulin aspart  4 Units Subcutaneous TID WC   insulin glargine-yfgn  20 Units Subcutaneous Daily   metoprolol succinate  25 mg Oral Daily   nutrition supplement (JUVEN)  1 packet Oral BID BM   pantoprazole  40 mg Oral Daily   rosuvastatin  40 mg Oral Daily   zinc sulfate  220 mg Oral Daily   Continuous Infusions:  sodium chloride Stopped (03/21/23 2351)   azithromycin Stopped (03/21/23 2312)   cefTRIAXone (ROCEPHIN)  IV Stopped (03/21/23 2154)   magnesium sulfate bolus IVPB       LOS: 11 days   Lonia Blood, MD [facility] Office  [phone] Pager - Text Page per Loretha Stapler  If 7PM-7AM, please contact night-coverage per Amion 03/22/2023, 10:01 AM

## 2023-03-22 NOTE — Progress Notes (Addendum)
Physical Therapy Treatment Patient Details Name: Troy [name] MRN: [id number] DOB: [birth year] Today's Date: 03/22/2023   History of Present Illness Pt is a 62 yo male presenting with dehiscence of R transmet amputation site. 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 S/p TEE and cardioversion for aflutter on 9/26, back in aflutter as of 9/27 with plan for another cardioversion attempt on 9/30. PMH: CHF, COPD, DM2, HTN, restless leg syndrome, PTSD, RLE trans met amputation, L AKA    PT Comments  Patient progressing able to transfer with minimal A with anterior-posterior technique with cues for safety and assist to prevent further skin damage on bottom.  Noted skin breakdown on posterior and anterior thigh from limb protector so contacted orthotist to help to limit further issues with wear.  Patient remains appropriate for inpatient rehab (< 3 hours/day) prior to d/c home.  PT to continue to follow.     If plan is discharge home, recommend the following: A little help with walking and/or transfers;Assistance with cooking/housework   Can travel by private vehicle        Equipment Recommendations  None recommended by PT    Recommendations for Other Services       Precautions / Restrictions Precautions Precautions: Fall Precaution Comments: VAC R BKA Required Braces or Orthoses: Other Brace Other Brace: R residual limb protector Restrictions Weight Bearing Restrictions: Yes RLE Weight Bearing: Non weight bearing     Mobility  Bed Mobility Overal bed mobility: Needs Assistance Bed Mobility: Rolling, Supine to Sit Rolling: Supervision   Supine to sit: Supervision, Used rails, HOB elevated     General bed mobility comments: using both rails with cues for up to long sitting, assist to scoot in bed for positioning to transfer    Transfers Overall transfer level: Needs assistance   Transfers: Bed to chair/wheelchair/BSC         Anterior-Posterior transfers: Min assist   General transfer comment:  used bed pads to help pt scoot to limit shear on excoriated bottom    Ambulation/Gait                   Stairs             Wheelchair Mobility     Tilt Bed    Modified Rankin (Stroke Patients Only)       Balance Overall balance assessment: Needs assistance Sitting-balance support: Bilateral upper extremity supported Sitting balance-Leahy Scale: Poor Sitting balance - Comments: UE support needed for sitting balance                                    Cognition Arousal: Alert Behavior During Therapy: Flat affect                                   General Comments: WFL, patient flat and minimally communicative.        Exercises Amputee Exercises Hip ABduction/ADduction: 10 reps, Strengthening, Both, Seated (adductor squeezes with 5 sec hold) Knee Flexion: Strengthening, Right, 10 reps, Seated (5 sec hold)    General Comments General comments (skin integrity, edema, etc.): applied cream to bottom and foam dressings on posterior R thigh and anterior thigh where limb protector had rubbed, called Hangar to figure plan to limit skin breakdown from limb protector; patient on RA with mobility and mild dyspnea SpO2 96%  Pertinent Vitals/Pain Pain Assessment Pain Score: 7  Pain Location: R LE Pain Descriptors / Indicators: Discomfort, Grimacing Pain Intervention(s): Monitored during session, Limited activity within patient's tolerance, Repositioned    Home Living                          Prior Function            PT Goals (current goals can now be found in the care plan section) Progress towards PT goals: Progressing toward goals    Frequency    Min 1X/week      PT Plan      Co-evaluation              AM-PAC PT "6 Clicks" Mobility   Outcome Measure  Help needed turning from your back to your side while in a flat bed without using bedrails?: A Little Help needed moving from lying on your back  to sitting on the side of a flat bed without using bedrails?: A Little Help needed moving to and from a bed to a chair (including a wheelchair)?: A Little Help needed standing up from a chair using your arms (e.g., wheelchair or bedside chair)?: A Little Help needed to walk in hospital room?: Total Help needed climbing 3-5 steps with a railing? : Total 6 Click Score: 14    End of Session Equipment Utilized During Treatment: Gait belt Activity Tolerance: Patient tolerated treatment well Patient left: in chair;with call bell/phone within reach   PT Visit Diagnosis: Other abnormalities of gait and mobility (R26.89);Muscle weakness (generalized) (M62.81)     Time: 1610-9604 PT Time Calculation (min) (ACUTE ONLY): 24 min  Charges:    $Therapeutic Exercise: 8-22 mins $Therapeutic Activity: 8-22 mins PT General Charges $$ ACUTE PT VISIT: 1 Visit                     Sheran Lawless, PT Acute Rehabilitation Services Office:[phone] 03/22/2023    Elray Mcgregor 03/22/2023, 1:40 PM

## 2023-03-22 NOTE — TOC Progression Note (Addendum)
Transition of Care [facility]) - Progression Note    Patient Details  Name: Troy [name] MRN: [id number] Date of Birth: 10-12-1960  [facility] CM/SW Contact  Delilah Shan, LCSWA Phone Number: 03/22/2023, 4:12 PM  Clinical Narrative:     CSW provided SNF bed offers to patients spouse [name]. [name] accepted SNF bed offer with [facility]. Star with [facility] confirmed SNF bed for patient.CSW started insurance authorization for patient. Auth ID# [id number]. Insurance authorization currently pending.  Expected Discharge Plan: Skilled Nursing Facility Barriers to Discharge: Other (must enter comment) (Pt not working with PT, pt wants to go home, wife refusing to receive pt)  Expected Discharge Plan and Services In-house Referral: Clinical Social Work Discharge Planning Services: CM Consult Post Acute Care Choice: Skilled Nursing Facility Living arrangements for the past 2 months: Single Family Home Expected Discharge Date: 03/15/23                                     Social Determinants of Health (SDOH) Interventions SDOH Screenings   Food Insecurity: No Food Insecurity (03/11/2023)  Housing: Low Risk  (03/11/2023)  Transportation Needs: No Transportation Needs (03/11/2023)  Utilities: Not At Risk (03/11/2023)  Financial Resource Strain: Low Risk  (04/09/2021)   Received from [facility], [facility]  Physical Activity: Insufficiently Active (04/09/2021)   Received from [facility], [facility]  Social Connections: Unknown (10/22/2021)   Received from [facility], [facility]  Stress: No Stress Concern Present (04/09/2021)   Received from [facility], [facility]  Tobacco Use: High Risk (03/11/2023)    Readmission Risk Interventions    08/25/2021   12:46 PM  Readmission Risk Prevention Plan  Transportation Screening Complete  Medication Review (RN Care Manager) Complete  PCP or Specialist appointment within 3-5 days of discharge Complete  HRI  or Home Care Consult Complete  SW Recovery Care/Counseling Consult Complete  Palliative Care Screening Not Applicable  Skilled Nursing Facility Complete

## 2023-03-22 NOTE — Progress Notes (Signed)
Occupational Therapy Treatment Patient Details Name: Troy [name] MRN: [id number] DOB: [birth year] Today's Date: 03/22/2023   History of present illness Pt is a 62 yo male presenting with dehiscence of R transmet amputation site. 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 S/p TEE and cardioversion for aflutter on 9/26, back in aflutter as of 9/27 with plan for another cardioversion attempt on 9/30. PMH: CHF, COPD, DM2, HTN, restless leg syndrome, PTSD, RLE trans met amputation, L AKA   OT comments  Patient with good progress toward patient focused goals.  Patient with no expressed pain, good dynamic sitting balance and improved A/P transfers.  Patient needing setup for UB ADL, Min A for LB ADL and Min A for transfers.  OT can continue efforts in the acute setting, and Patient will benefit from continued inpatient follow up therapy, <3 hours/day prior to returning home.  Patient demonstrates ability to participate in a more aggressive rehab, and should be able to achieve a Mod I level.         If plan is discharge home, recommend the following:  Assist for transportation;Assistance with cooking/housework;A lot of help with walking and/or transfers;A lot of help with bathing/dressing/bathroom   Equipment Recommendations  None recommended by OT    Recommendations for Other Services      Precautions / Restrictions Precautions Precautions: Fall Precaution Comments: VAC R BKA Required Braces or Orthoses: Other Brace Other Brace: R residual limb protector Restrictions Weight Bearing Restrictions: Yes RLE Weight Bearing: Non weight bearing       Mobility Bed Mobility Overal bed mobility: Needs Assistance   Rolling: Supervision           Patient Response: Cooperative  Transfers Overall transfer level: Needs assistance             Anterior-Posterior transfers: Min assist         Balance Overall balance assessment: Needs assistance Sitting-balance support: Bilateral upper extremity  supported Sitting balance-Leahy Scale: Fair                                     ADL either performed or assessed with clinical judgement   ADL   Eating/Feeding: Sitting;Independent   Grooming: Set up;Sitting   Upper Body Bathing: Set up;Sitting   Lower Body Bathing: Minimal assistance;Sitting/lateral leans   Upper Body Dressing : Supervision/safety;Sitting   Lower Body Dressing: Minimal assistance;Moderate assistance;Sit to/from stand   Toilet Transfer: Minimal assistance                  Extremity/Trunk Assessment Upper Extremity Assessment Upper Extremity Assessment: Overall WFL for tasks assessed   Lower Extremity Assessment Lower Extremity Assessment: Defer to PT evaluation   Cervical / Trunk Assessment Cervical / Trunk Assessment: Normal    Vision Patient Visual Report: No change from baseline     Perception Perception Perception: Not tested   Praxis Praxis Praxis: Not tested    Cognition Arousal: Alert Behavior During Therapy: Flat affect Overall Cognitive Status: History of cognitive impairments - at baseline                                 General Comments: WFL, patient flat and minimally communicative.        Exercises      Shoulder Instructions       General Comments applied cream to bottom and foam dressings  on posterior R thigh and anterior thigh where limb protector had rubbed, called Hangar to figure plan to limit skin breakdown from limb protector; patient on RA with mobility and mild dyspnea SpO2 96%    Pertinent Vitals/ Pain       Pain Assessment Pain Assessment: No/denies pain Pain Intervention(s): Monitored during session                                                          Frequency  Min 1X/week        Progress Toward Goals  OT Goals(current goals can now be found in the care plan section)  Progress towards OT goals: Progressing toward goals  Acute Rehab  OT Goals OT Goal Formulation: With patient Time For Goal Achievement: 04/01/23 Potential to Achieve Goals: Fair  Plan      Co-evaluation                 AM-PAC OT "6 Clicks" Daily Activity     Outcome Measure   Help from another person eating meals?: None Help from another person taking care of personal grooming?: None Help from another person toileting, which includes using toliet, bedpan, or urinal?: A Little Help from another person bathing (including washing, rinsing, drying)?: A Little Help from another person to put on and taking off regular upper body clothing?: None Help from another person to put on and taking off regular lower body clothing?: A Little 6 Click Score: 21    End of Session    OT Visit Diagnosis: Unsteadiness on feet (R26.81)   Activity Tolerance Patient tolerated treatment well   Patient Left in bed;with call bell/phone within reach;with family/visitor present   Nurse Communication Mobility status        Time: 1250-1310 OT Time Calculation (min): 20 min  Charges: OT General Charges $OT Visit: 1 Visit OT Treatments $Self Care/Home Management : 8-22 mins  03/22/2023  RP, OTR/L  Acute Rehabilitation Services  Office:  [phone]   [name] 03/22/2023, 1:42 PM

## 2023-03-22 NOTE — Plan of Care (Signed)

## 2023-03-23 ENCOUNTER — Inpatient Hospital Stay (HOSPITAL_COMMUNITY): Payer: Medicare Other

## 2023-03-23 DIAGNOSIS — I480 Paroxysmal atrial fibrillation: Secondary | ICD-10-CM

## 2023-03-23 DIAGNOSIS — S88111S Complete traumatic amputation at level between knee and ankle, right lower leg, sequela: Secondary | ICD-10-CM

## 2023-03-23 LAB — COMPREHENSIVE METABOLIC PANEL
ALT: 20 U/L (ref 0–44)
AST: 18 U/L (ref 15–41)
Albumin: 2.2 g/dL — ABNORMAL LOW (ref 3.5–5.0)
Alkaline Phosphatase: 79 U/L (ref 38–126)
Anion gap: 11 (ref 5–15)
BUN: 25 mg/dL — ABNORMAL HIGH (ref 8–23)
CO2: 22 mmol/L (ref 22–32)
Calcium: 8.4 mg/dL — ABNORMAL LOW (ref 8.9–10.3)
Chloride: 110 mmol/L (ref 98–111)
Creatinine, Ser: 0.98 mg/dL (ref 0.61–1.24)
GFR, Estimated: >60 mL/min (ref >60)
Glucose, Bld: 133 mg/dL — ABNORMAL HIGH (ref 70–99)
Potassium: 3.5 mmol/L (ref 3.5–5.1)
Sodium: 143 mmol/L (ref 135–145)
Total Bilirubin: 0.4 mg/dL (ref 0.3–1.2)
Total Protein: 6.3 g/dL — ABNORMAL LOW (ref 6.5–8.1)

## 2023-03-23 LAB — GLUCOSE, CAPILLARY
Glucose-Capillary: 114 mg/dL — ABNORMAL HIGH (ref 70–99)
Glucose-Capillary: 171 mg/dL — ABNORMAL HIGH (ref 70–99)
Glucose-Capillary: 62 mg/dL — ABNORMAL LOW (ref 70–99)
Glucose-Capillary: 80 mg/dL (ref 70–99)
Glucose-Capillary: 141 mg/dL — ABNORMAL HIGH (ref 70–99)

## 2023-03-23 LAB — CBC
HCT: 30.1 % — ABNORMAL LOW (ref 39.0–52.0)
Hemoglobin: 9.1 g/dL — ABNORMAL LOW (ref 13.0–17.0)
MCH: 26.1 pg (ref 26.0–34.0)
MCHC: 30.2 g/dL (ref 30.0–36.0)
MCV: 86.2 fL (ref 80.0–100.0)
Platelets: 364 10*3/uL (ref 150–400)
RBC: 3.49 MIL/uL — ABNORMAL LOW (ref 4.22–5.81)
RDW: 18.7 % — ABNORMAL HIGH (ref 11.5–15.5)
WBC: 13.8 10*3/uL — ABNORMAL HIGH (ref 4.0–10.5)
nRBC: 0 % (ref 0.0–0.2)

## 2023-03-23 MED ORDER — AMIODARONE HCL 200 MG PO TABS: ORAL_TABLET | ORAL | Status: DC

## 2023-03-23 MED ORDER — METOPROLOL SUCCINATE ER 25 MG PO TB24: 25.0000 mg | ORAL_TABLET | Freq: Every day | ORAL | Status: DC

## 2023-03-23 MED ORDER — ROSUVASTATIN CALCIUM 40 MG PO TABS: 40.0000 mg | ORAL_TABLET | Freq: Every day | ORAL | Status: AC

## 2023-03-23 MED ORDER — FUROSEMIDE 40 MG PO TABS: 40.0000 mg | ORAL_TABLET | Freq: Every day | ORAL | Status: DC

## 2023-03-23 MED ORDER — OXYCODONE-ACETAMINOPHEN 10-325 MG PO TABS: 1.0000 | ORAL_TABLET | ORAL | 0 refills | Status: DC | PRN
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
## 2023-03-23 NOTE — Progress Notes (Signed)
Hypoglycemic Event  CBG: 62  Treatment: 4 oz juice/soda  Symptoms: None  Follow-up CBG: Time:1748  CBG Result:80  Possible Reasons for Event: Inadequate meal intake     Troy [name] O Troy [name]

## 2023-03-23 NOTE — Progress Notes (Signed)
Pt has declined Bipap for the night

## 2023-03-23 NOTE — Discharge Summary (Addendum)
In addition to d/c from Dr. Lajoyce Corners-- please see this updated med rec list: -will also need LFTs/lipids in 6 weeks as well as follow dg chest -wean to RA as able -cardiology follow up -dry dressing with 4 x 4 gauze and an Ace wrap   Allergies as of 03/23/2023       Reactions   Hydromorphone Hcl Er Anaphylaxis, Other (See Comments)   Allergic to DYE in extended-release tablet, can tolerate other forms of hydromorphone   Tapentadol Anaphylaxis, Swelling, Other (See Comments)   THROAT ANGIOEDEMA Nucynta [Tapentadol Hydrochloride]   Exalamide Other (See Comments)   UNSPECIFIED REACTION         Medication List     STOP taking these medications    amoxicillin-clavulanate 500-125 MG tablet Commonly known as: Augmentin   aspirin EC 81 MG tablet   clopidogrel 75 MG tablet Commonly known as: Plavix       TAKE these medications    amiodarone 200 MG tablet Commonly known as: PACERONE Take 2 tablets (400 mg total) by mouth 2 (two) times daily for 8 days, THEN 1 tablet (200 mg total) daily. Start taking on: March 23, 2023   apixaban 5 MG Tabs tablet Commonly known as: ELIQUIS Take 1 tablet (5 mg total) by mouth 2 (two) times daily.   doxycycline 100 MG tablet Commonly known as: VIBRA-TABS Take 1 tablet (100 mg total) by mouth 2 (two) times daily.   empagliflozin 10 MG Tabs tablet Commonly known as: [name] Take 1 tablet (10 mg total) by mouth daily before breakfast.   FreeStyle Libre 3 Reader [employer] Use as directed with lifestyle libre sensors   FreeStyle Libre 3 Sensor Misc Place 1 sensor on the skin every 14 days. Use to check glucose continuously   furosemide 40 MG tablet Commonly known as: LASIX Take 1 tablet (40 mg total) by mouth daily for 2 days.   glipiZIDE 10 MG tablet Commonly known as: GLUCOTROL Take 10 mg by mouth 2 (two) times daily.   insulin degludec 100 UNIT/ML FlexTouch Pen Commonly known as: TRESIBA Inject 40 Units into the skin daily. What  changed: how much to take   insulin lispro 100 UNIT/ML KwikPen Commonly known as: HUMALOG Inject 18 Units into the skin 3 (three) times daily. What changed: how much to take   metoprolol succinate 25 MG 24 hr tablet Commonly known as: TOPROL-XL Take 1 tablet (25 mg total) by mouth daily.   oxyCODONE-acetaminophen 10-325 MG tablet Commonly known as: PERCOCET Take 1 tablet by mouth every 4 (four) hours as needed for pain.   pantoprazole 40 MG tablet Commonly known as: PROTONIX Take 1 tablet (40 mg total) by mouth daily.   Pen Needles 3/16" 31G X 5 MM Misc Use as directed with insulin pen   rosuvastatin 40 MG tablet Commonly known as: CRESTOR Take 1 tablet (40 mg total) by mouth daily. What changed:  medication strength how much to take               Durable Medical Equipment  (From admission, onward)           Start     Ordered   03/15/23 0908  For home use only DME Vac  Once       Comments: Attach the wound VAC dressing to the Prevena plus portable wound VAC pump for discharge.   03/15/23 1610

## 2023-03-23 NOTE — Progress Notes (Signed)
Patient ID: Troy [name], male   DOB: Apr 07, 1961, 61 y.o.   MRN: [id number] Patient possible discharge to [facility].  Patient sitting on the commode at bedside this morning.  I placed an order for Percocet on the chart for anticipated discharge today.

## 2023-03-23 NOTE — TOC Progression Note (Addendum)
Transition of Care [facility]) - Progression Note    Patient Details  Name: Troy [name] MRN: [id number] Date of Birth: 04-16-1961  Transition of [facility] CM/SW Contact  Delilah Shan, LCSWA Phone Number: 03/23/2023, 11:56 AM  Clinical Narrative:     Patients insurance authorization currently pending. Patients insurance requested current Progress note from MD and patients prior level of function. CSW submitted information provided to patients insurance. Facility request for patients spouse to call patients insurance regarding an open workers comp. Claim . CSW informed patients spouse who is going to call and request to have it closed out.MD informed CSW no bipap needed at facility. CSW will continue to follow.  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 Insurance authorization approved from 10/2-10/4.   Update-3:58pm-Facility informed CSW that everything is worked out in regards to workers comp. Claim. Facility confirmed they can accept patient tomorrow for dc. CSW informed MD.  Expected Discharge Plan: Skilled Nursing Facility Barriers to Discharge: Other (must enter comment) (Pt not working with PT, pt wants to go home, wife refusing to receive pt)  Expected Discharge Plan and Services In-house Referral: Clinical Social Work Discharge Planning Services: CM Consult Post Acute Care Choice: Skilled Nursing Facility Living arrangements for the past 2 months: Single Family Home Expected Discharge Date: 03/23/23                                     Social Determinants of Health (SDOH) Interventions SDOH Screenings   Food Insecurity: No Food Insecurity (03/11/2023)  Housing: Low Risk  (03/11/2023)  Transportation Needs: No Transportation Needs (03/11/2023)  Utilities: Not At Risk (03/11/2023)  Financial Resource Strain: Low Risk  (04/09/2021)   Received from [facility], [facility]  Physical Activity: Insufficiently Active (04/09/2021)    Received from [facility], [facility]  Social Connections: Unknown (10/22/2021)   Received from [facility], [facility]  Stress: No Stress Concern Present (04/09/2021)   Received from [facility], [facility]  Tobacco Use: High Risk (03/11/2023)    Readmission Risk Interventions    08/25/2021   12:46 PM  Readmission Risk Prevention Plan  Transportation Screening Complete  Medication Review (RN Care Manager) Complete  PCP or Specialist appointment within 3-5 days of discharge Complete  HRI or Home Care Consult Complete  SW Recovery Care/Counseling Consult Complete  Palliative Care Screening Not Applicable  Skilled Nursing Facility Complete

## 2023-03-23 NOTE — Progress Notes (Addendum)
RT at bedside this AM. RT found pt on room air with BiPAP off at bedside. Pt in no distress at this time.

## 2023-03-24 DIAGNOSIS — M86171 Other acute osteomyelitis, right ankle and foot: Secondary | ICD-10-CM | POA: Diagnosis not present

## 2023-03-24 LAB — GLUCOSE, CAPILLARY
Glucose-Capillary: 133 mg/dL — ABNORMAL HIGH (ref 70–99)
Glucose-Capillary: 106 mg/dL — ABNORMAL HIGH (ref 70–99)

## 2023-03-24 MED ORDER — INSULIN ASPART 100 UNIT/ML IJ SOLN: 4.0000 [IU] | Freq: Three times a day (TID) | INTRAMUSCULAR | Status: DC

## 2023-03-24 MED ORDER — INSULIN ASPART 100 UNIT/ML IJ SOLN: 0.0000 [IU] | Freq: Three times a day (TID) | INTRAMUSCULAR | Status: DC

## 2023-03-24 MED ORDER — INSULIN GLARGINE-YFGN 100 UNIT/ML ~~LOC~~ SOLN: 15.0000 [IU] | Freq: Every day | SUBCUTANEOUS | Status: DC

## 2023-03-24 MED ORDER — INSULIN GLARGINE-YFGN 100 UNIT/ML ~~LOC~~ SOLN
15.0000 [IU] | Freq: Every day | SUBCUTANEOUS | Status: DC
  Administered 2023-03-24: 15 [IU] via SUBCUTANEOUS
  Filled 2023-03-24: qty 0.15

## 2023-03-24 NOTE — Discharge Summary (Addendum)
Physician Discharge Summary  Troy [name] SWF:[id number] DOB: Nov 07, 1960 DOA: 03/11/2023  PCP: Oneita Hurt, No  Admit date: 03/11/2023 Discharge date: 03/24/2023  Admitted From: home Discharge disposition: SNF   Recommendations for Outpatient Follow-Up:   -will also need LFTs/lipids in 6 weeks as well as follow dg chest Monitor and adjust blood sugars meds -wean to RA as able -cardiology follow up -dry dressing with 4 x 4 gauze and an Ace wrap    Discharge Diagnosis:   Principal Problem:   Below-knee amputation of right lower extremity (HCC) Active Problems:   Multifocal pneumonia   Osteomyelitis of ankle or foot, acute, right (HCC)   Abscess of right lower leg   Respiratory distress    Discharge Condition: Improved.  Diet recommendation: Low sodium, heart healthy.  Carbohydrate-modified.  Wound care: None.  Code status: Full.   History of Present Illness:   62 year old male with past medical history as below, which is significant for uncontrolled diabetes, peripheral vascular disease status post stenting of the right femoral artery on 8/12, cirrhosis, COPD, and HFpEF. The patient is not very forthcoming with medical history, however, his wife is able to provide some recent history. He has not been feeling well for the last couple of days and not acting normally. She noticed he had not been smoking cigarettes very frequently and not eating very well. She was checking his blood pressure when she noted that his heart rate was quite high and for this reason presented to [facility] emergency department in the evening hours of 8/16. Upon arrival to the emergency department he was noted to be in a narrow complex tachycardia. He was given adenosine which did sufficiently slow his rate down long enough to demonstrate underlying atrial flutter. He was treated with metoprolol without improvement. Diltiazem bolus and infusion were initiated, however, he then became hypotensive and  diltiazem was stopped. Cardiology was consulted by the EDP who recommended digoxin IV bolus which also was not effective. The patient was transferred to [facility] for cardiology evaluation and ICU admission in the setting of hypotension. PCCM was asked to admit.    Hospital Course by Problem:   Acute hypoxic respiratory failure - pneumonia versus pulmonary edema v/s other  - empiric antibiotic therapy for now  -patient is COVID and influenza negative -of note the patient had a similar episode in August at the time of his TMA surgery which was responsive to steroid therapy and thought to possibly be related to amiodarone induced lung toxicity versus CHF and was treated with a very slow prednisone taper over 3 weeks -the patient has not been afebrile though he has had an elevated white count -procalcitonin of <0.10 is strongly suggestive of a noninfectious etiology such  -of note amiodarone has also been resumed by cardiology so we must consider if this is playing a role - for now the patient is much improved clinically -wean O2 as able -close cards follow up   Paroxysmal atrial flutter Care per cardiology -patient declined ablation -ultimately converted to NSR status post DCCV 9/26 -continue Toprol and Eliquis -of note amiodarone has also been resumed by cardiology and see taper below   Chronic diastolic congestive heart failure EF noted to be 70-75% this admission -lasix x 3 days per cards   PAD -right BKA 03/11/2023 -left BKA 01/21/2021 Postop wound care per orthopedics   DM2 -SSI and long acting    Medical Consultants:   PCCM Cards ortho   Discharge  Exam:   Vitals:   03/24/23 0312 03/24/23 0734  BP: 132/65 (!) 147/69  Pulse: 63   Resp: 16 16  Temp:  97.6 F (36.4 C)  SpO2: 95%    Vitals:   03/23/23 1729 03/23/23 2031 03/24/23 0312 03/24/23 0734  BP: 126/70 (!) 110/55 132/65 (!) 147/69  Pulse: 64 66 63   Resp: 20 18 16 16   Temp: 97.7 F (36.5 C) 98.2 F (36.8  C)  97.6 F (36.4 C)  TempSrc: Oral Oral  Oral  SpO2: 94% 90% 95%   Weight:      Height:        General exam: Appears calm and comfortable.    The results of significant diagnostics from this hospitalization (including imaging, microbiology, ancillary and laboratory) are listed below for reference.     Procedures and Diagnostic Studies:   No results found.   Labs:   Basic Metabolic Panel: Recent Labs  Lab 03/18/23 1915 03/21/23 2043 03/22/23 0458 03/23/23 0418  NA 135 138 139 143  K 4.0 3.8 3.7 3.5  CL 107 106 109 110  CO2 20* 21* 20* 22  GLUCOSE 114* 122* 101* 133*  BUN 21 27* 24* 25*  CREATININE 0.82 0.94 0.86 0.98  CALCIUM 7.7* 8.1* 8.0* 8.4*  MG 1.7  --   --   --    GFR Estimated Creatinine Clearance: 82.2 mL/min (by C-G formula based on SCr of 0.98 mg/dL). Liver Function Tests: Recent Labs  Lab 03/18/23 1915 03/23/23 0418  AST 25 18  ALT 19 20  ALKPHOS 71 79  BILITOT 0.6 0.4  PROT 6.0* 6.3*  ALBUMIN 1.9* 2.2*   No results for input(s): "LIPASE", "AMYLASE" in the last 168 hours. No results for input(s): "AMMONIA" in the last 168 hours. Coagulation profile No results for input(s): "INR", "PROTIME" in the last 168 hours.  CBC: Recent Labs  Lab 03/17/23 0812 03/18/23 1915 03/21/23 2043 03/22/23 0458 03/23/23 0418  WBC 18.4* 20.2* 19.9* 15.1* 13.8*  NEUTROABS  --   --  17.5*  --   --   HGB 9.2* 8.7* 8.8* 8.8* 9.1*  HCT 28.9* 28.1* 28.7* 29.1* 30.1*  MCV 85.0 84.9 86.2 83.9 86.2  PLT 354 327 345 330 364   Cardiac Enzymes: No results for input(s): "CKTOTAL", "CKMB", "CKMBINDEX", "TROPONINI" in the last 168 hours. BNP: Invalid input(s): "POCBNP" CBG: Recent Labs  Lab 03/23/23 1250 03/23/23 1727 03/23/23 1748 03/23/23 2044 03/24/23 0732  GLUCAP 171* 62* 80 141* 133*   D-Dimer No results for input(s): "DDIMER" in the last 72 hours. Hgb A1c No results for input(s): "HGBA1C" in the last 72 hours. Lipid Profile No results for  input(s): "CHOL", "HDL", "LDLCALC", "TRIG", "CHOLHDL", "LDLDIRECT" in the last 72 hours. Thyroid function studies No results for input(s): "TSH", "T4TOTAL", "T3FREE", "THYROIDAB" in the last 72 hours.  Invalid input(s): "FREET3" Anemia work up No results for input(s): "VITAMINB12", "FOLATE", "FERRITIN", "TIBC", "IRON", "RETICCTPCT" in the last 72 hours. Microbiology Recent Results (from the past 240 hour(s))  Resp panel by RT-PCR (RSV, Flu A&B, Covid) Anterior Nasal Swab     Status: None   Collection Time: 03/21/23  6:34 PM   Specimen: Anterior Nasal Swab  Result Value Ref Range Status   SARS Coronavirus 2 by RT PCR NEGATIVE NEGATIVE Final   Influenza A by PCR NEGATIVE NEGATIVE Final   Influenza B by PCR NEGATIVE NEGATIVE Final    Comment: (NOTE) The Xpert Xpress SARS-CoV-2/FLU/RSV plus assay is intended as an aid  in the diagnosis of influenza from Nasopharyngeal swab specimens and should not be used as a sole basis for treatment. Nasal washings and aspirates are unacceptable for Xpert Xpress SARS-CoV-2/FLU/RSV testing.  Fact Sheet for Patients: BloggerCourse.com  Fact Sheet for Healthcare Providers: SeriousBroker.it  This test is not yet approved or cleared by the [place] FDA and has been authorized for detection and/or diagnosis of SARS-CoV-2 by FDA under an Emergency Use Authorization (EUA). This EUA will remain in effect (meaning this test can be used) for the duration of the COVID-19 declaration under Section 564(b)(1) of the Act, 21 U.S.C. section 360bbb-3(b)(1), unless the authorization is terminated or revoked.     Resp Syncytial Virus by PCR NEGATIVE NEGATIVE Final    Comment: (NOTE) Fact Sheet for Patients: BloggerCourse.com  Fact Sheet for Healthcare Providers: SeriousBroker.it  This test is not yet approved or cleared by the [place] FDA and has been  authorized for detection and/or diagnosis of SARS-CoV-2 by FDA under an Emergency Use Authorization (EUA). This EUA will remain in effect (meaning this test can be used) for the duration of the COVID-19 declaration under Section 564(b)(1) of the Act, 21 U.S.C. section 360bbb-3(b)(1), unless the authorization is terminated or revoked.  Performed at [facility], [address], [place], [place] [address]      Discharge Instructions:   Discharge Instructions     Call MD / Call 911   Complete by: As directed    If you experience chest pain or shortness of breath, CALL 911 and be transported to the hospital emergency room.  If you develope a fever above 101 F, pus (white drainage) or increased drainage or redness at the wound, or calf pain, call your surgeon's office.   Call MD / Call 911   Complete by: As directed    If you experience chest pain or shortness of breath, CALL 911 and be transported to the hospital emergency room.  If you develope a fever above 101 F, pus (white drainage) or increased drainage or redness at the wound, or calf pain, call your surgeon's office.   Constipation Prevention   Complete by: As directed    Drink plenty of fluids.  Prune juice may be helpful.  You may use a stool softener, such as Colace (over the counter) 100 mg twice a day.  Use MiraLax (over the counter) for constipation as needed.   Constipation Prevention   Complete by: As directed    Drink plenty of fluids.  Prune juice may be helpful.  You may use a stool softener, such as Colace (over the counter) 100 mg twice a day.  Use MiraLax (over the counter) for constipation as needed.   Diet - low sodium heart healthy   Complete by: As directed    Diet - low sodium heart healthy   Complete by: As directed    Increase activity slowly as tolerated   Complete by: As directed    Increase activity slowly as tolerated   Complete by: As directed    Post-operative opioid taper instructions:    Complete by: As directed    POST-OPERATIVE OPIOID TAPER INSTRUCTIONS: It is important to wean off of your opioid medication as soon as possible. If you do not need pain medication after your surgery it is ok to stop day one. Opioids include: Codeine, Hydrocodone(Norco, Vicodin), Oxycodone(Percocet, oxycontin) and hydromorphone amongst others.  Long term and even short term use of opiods can cause: Increased pain response Dependence Constipation  Depression Respiratory depression And more.  Withdrawal symptoms can include Flu like symptoms Nausea, vomiting And more Techniques to manage these symptoms Hydrate well Eat regular healthy meals Stay active Use relaxation techniques(deep breathing, meditating, yoga) Do Not substitute Alcohol to help with tapering If you have been on opioids for less than two weeks and do not have pain than it is ok to stop all together.  Plan to wean off of opioids This plan should start within one week post op of your joint replacement. Maintain the same interval or time between taking each dose and first decrease the dose.  Cut the total daily intake of opioids by one tablet each day Next start to increase the time between doses. The last dose that should be eliminated is the evening dose.      Post-operative opioid taper instructions:   Complete by: As directed    POST-OPERATIVE OPIOID TAPER INSTRUCTIONS: It is important to wean off of your opioid medication as soon as possible. If you do not need pain medication after your surgery it is ok to stop day one. Opioids include: Codeine, Hydrocodone(Norco, Vicodin), Oxycodone(Percocet, oxycontin) and hydromorphone amongst others.  Long term and even short term use of opiods can cause: Increased pain response Dependence Constipation Depression Respiratory depression And more.  Withdrawal symptoms can include Flu like symptoms Nausea, vomiting And more Techniques to manage these symptoms Hydrate  well Eat regular healthy meals Stay active Use relaxation techniques(deep breathing, meditating, yoga) Do Not substitute Alcohol to help with tapering If you have been on opioids for less than two weeks and do not have pain than it is ok to stop all together.  Plan to wean off of opioids This plan should start within one week post op of your joint replacement. Maintain the same interval or time between taking each dose and first decrease the dose.  Cut the total daily intake of opioids by one tablet each day Next start to increase the time between doses. The last dose that should be eliminated is the evening dose.            Contact information for follow-up providers     Nadara Mustard, MD Follow up in 1 week(s).   Specialty: Orthopedic Surgery Contact information: [address] [place] [address] [phone]              Contact information for after-discharge care     Destination     [facility], [facility] Preferred SNF .   Service: Skilled Nursing Contact information: [address] [place] [address] [phone]                      Time coordinating discharge: 45 min  Signed:  Joseph Art DO  [facility] 03/24/2023, 7:37 AM

## 2023-03-24 NOTE — Progress Notes (Signed)
Report given to [employer] at [facility]

## 2023-03-24 NOTE — TOC Transition Note (Signed)
Transition of Care [facility]) - CM/SW Discharge Note   Patient Details  Name: Troy [name] MRN: [id number] Date of Birth: Sep 12, 1960  [facility] CM/SW Contact:  Delilah Shan, LCSWA Phone Number: 03/24/2023, 12:23 PM   Clinical Narrative:     Patient will DC to: [facility]  Anticipated DC date: 03/24/2023  Family notified: [name]   Transport by: Sharin Mons  ?  Per MD patient ready for DC to [facility] . RN, patient, patient's family, and facility notified of DC. Discharge Summary sent to facility. RN given number for report tele# [phone] RM# [id number]. DC packet on chart. Ambulance transport requested for patient.  CSW signing off.   Final next level of care: Skilled Nursing Facility Barriers to Discharge: No Barriers Identified   Patient Goals and CMS Choice CMS Medicare.gov Compare Post Acute Care list provided to:: Patient Represenative (must comment) Choice offered to / list presented to : Spouse  Discharge Placement                Patient chooses bed at: [facility] Patient to be transferred to facility by: [facility] Name of family member notified: [name] Patient and family notified of of transfer: 03/24/23  Discharge Plan and Services Additional resources added to the After Visit Summary for   In-house Referral: Clinical Social Work Discharge Planning Services: CM Consult Post Acute Care Choice: Skilled Nursing Facility                               Social Determinants of Health (SDOH) Interventions SDOH Screenings   Food Insecurity: No Food Insecurity (03/11/2023)  Housing: Low Risk  (03/11/2023)  Transportation Needs: No Transportation Needs (03/11/2023)  Utilities: Not At Risk (03/11/2023)  Financial Resource Strain: Low Risk  (04/09/2021)   Received from [facility], [facility]  Physical Activity: Insufficiently Active (04/09/2021)   Received from [facility], [facility]  Social Connections: Unknown (10/22/2021)   Received  from [facility], [facility]  Stress: No Stress Concern Present (04/09/2021)   [facility] from [facility], [facility]  Tobacco Use: High Risk (03/11/2023)     Readmission Risk Interventions    08/25/2021   12:46 PM  Readmission Risk Prevention Plan  Transportation Screening Complete  Medication Review (RN Care Manager) Complete  PCP or Specialist appointment within 3-5 days of discharge Complete  HRI or Home Care Consult Complete  SW Recovery Care/Counseling Consult Complete  Palliative Care Screening Not Applicable  Skilled Nursing Facility Complete

## 2023-03-30 ENCOUNTER — Ambulatory Visit: Payer: Medicare Other

## 2023-03-30 ENCOUNTER — Encounter: Payer: Medicare Other | Admitting: Family

## 2023-04-02 ENCOUNTER — Encounter (HOSPITAL_COMMUNITY): Payer: Self-pay | Admitting: *Deleted

## 2023-04-02 ENCOUNTER — Other Ambulatory Visit: Payer: Self-pay

## 2023-04-02 ENCOUNTER — Inpatient Hospital Stay (HOSPITAL_COMMUNITY)
Admission: EM | Admit: 2023-04-02 | Discharge: 2023-04-08 | DRG: 291 | Disposition: A | Discharge status: Skilled Nursing Facility | Payer: Medicare Other | Attending: Internal Medicine | Admitting: Internal Medicine

## 2023-04-02 ENCOUNTER — Emergency Department (HOSPITAL_COMMUNITY): Payer: Medicare Other

## 2023-04-02 DIAGNOSIS — E1151 Type 2 diabetes mellitus with diabetic peripheral angiopathy without gangrene: Secondary | ICD-10-CM | POA: Diagnosis present

## 2023-04-02 DIAGNOSIS — I10 Essential (primary) hypertension: Secondary | ICD-10-CM | POA: Diagnosis not present

## 2023-04-02 DIAGNOSIS — D509 Iron deficiency anemia, unspecified: Secondary | ICD-10-CM | POA: Diagnosis present

## 2023-04-02 DIAGNOSIS — E876 Hypokalemia: Secondary | ICD-10-CM | POA: Diagnosis not present

## 2023-04-02 DIAGNOSIS — E872 Acidosis, unspecified: Secondary | ICD-10-CM | POA: Diagnosis present

## 2023-04-02 DIAGNOSIS — F32A Depression, unspecified: Secondary | ICD-10-CM | POA: Diagnosis present

## 2023-04-02 DIAGNOSIS — Z1152 Encounter for screening for COVID-19: Secondary | ICD-10-CM

## 2023-04-02 DIAGNOSIS — R471 Dysarthria and anarthria: Secondary | ICD-10-CM | POA: Diagnosis not present

## 2023-04-02 DIAGNOSIS — I253 Aneurysm of heart: Secondary | ICD-10-CM | POA: Insufficient documentation

## 2023-04-02 DIAGNOSIS — Z79899 Other long term (current) drug therapy: Secondary | ICD-10-CM

## 2023-04-02 DIAGNOSIS — G930 Cerebral cysts: Secondary | ICD-10-CM | POA: Insufficient documentation

## 2023-04-02 DIAGNOSIS — R4781 Slurred speech: Secondary | ICD-10-CM | POA: Diagnosis not present

## 2023-04-02 DIAGNOSIS — Z89511 Acquired absence of right leg below knee: Secondary | ICD-10-CM | POA: Diagnosis not present

## 2023-04-02 DIAGNOSIS — I5031 Acute diastolic (congestive) heart failure: Secondary | ICD-10-CM | POA: Diagnosis present

## 2023-04-02 DIAGNOSIS — J9601 Acute respiratory failure with hypoxia: Secondary | ICD-10-CM | POA: Diagnosis present

## 2023-04-02 DIAGNOSIS — Z8249 Family history of ischemic heart disease and other diseases of the circulatory system: Secondary | ICD-10-CM

## 2023-04-02 DIAGNOSIS — Z823 Family history of stroke: Secondary | ICD-10-CM

## 2023-04-02 DIAGNOSIS — Z833 Family history of diabetes mellitus: Secondary | ICD-10-CM

## 2023-04-02 DIAGNOSIS — I110 Hypertensive heart disease with heart failure: Principal | ICD-10-CM | POA: Diagnosis present

## 2023-04-02 DIAGNOSIS — Z7984 Long term (current) use of oral hypoglycemic drugs: Secondary | ICD-10-CM

## 2023-04-02 DIAGNOSIS — D72829 Elevated white blood cell count, unspecified: Secondary | ICD-10-CM | POA: Diagnosis present

## 2023-04-02 DIAGNOSIS — E1165 Type 2 diabetes mellitus with hyperglycemia: Secondary | ICD-10-CM

## 2023-04-02 DIAGNOSIS — F17210 Nicotine dependence, cigarettes, uncomplicated: Secondary | ICD-10-CM | POA: Diagnosis present

## 2023-04-02 DIAGNOSIS — Z87442 Personal history of urinary calculi: Secondary | ICD-10-CM

## 2023-04-02 DIAGNOSIS — K7460 Unspecified cirrhosis of liver: Secondary | ICD-10-CM | POA: Diagnosis present

## 2023-04-02 DIAGNOSIS — J9602 Acute respiratory failure with hypercapnia: Secondary | ICD-10-CM | POA: Diagnosis present

## 2023-04-02 DIAGNOSIS — K219 Gastro-esophageal reflux disease without esophagitis: Secondary | ICD-10-CM | POA: Diagnosis present

## 2023-04-02 DIAGNOSIS — Z794 Long term (current) use of insulin: Secondary | ICD-10-CM | POA: Diagnosis not present

## 2023-04-02 DIAGNOSIS — Z89612 Acquired absence of left leg above knee: Secondary | ICD-10-CM

## 2023-04-02 DIAGNOSIS — Z720 Tobacco use: Secondary | ICD-10-CM | POA: Diagnosis present

## 2023-04-02 DIAGNOSIS — Z95820 Peripheral vascular angioplasty status with implants and grafts: Secondary | ICD-10-CM

## 2023-04-02 DIAGNOSIS — Z885 Allergy status to narcotic agent status: Secondary | ICD-10-CM

## 2023-04-02 DIAGNOSIS — R479 Unspecified speech disturbances: Secondary | ICD-10-CM | POA: Diagnosis not present

## 2023-04-02 DIAGNOSIS — G459 Transient cerebral ischemic attack, unspecified: Secondary | ICD-10-CM | POA: Diagnosis not present

## 2023-04-02 DIAGNOSIS — I5030 Unspecified diastolic (congestive) heart failure: Secondary | ICD-10-CM | POA: Diagnosis present

## 2023-04-02 DIAGNOSIS — J449 Chronic obstructive pulmonary disease, unspecified: Secondary | ICD-10-CM | POA: Diagnosis present

## 2023-04-02 DIAGNOSIS — I739 Peripheral vascular disease, unspecified: Secondary | ICD-10-CM | POA: Diagnosis not present

## 2023-04-02 DIAGNOSIS — R29810 Facial weakness: Secondary | ICD-10-CM | POA: Diagnosis not present

## 2023-04-02 DIAGNOSIS — D75839 Thrombocytosis, unspecified: Secondary | ICD-10-CM | POA: Diagnosis not present

## 2023-04-02 DIAGNOSIS — Z66 Do not resuscitate: Secondary | ICD-10-CM | POA: Diagnosis not present

## 2023-04-02 DIAGNOSIS — I959 Hypotension, unspecified: Secondary | ICD-10-CM | POA: Diagnosis present

## 2023-04-02 DIAGNOSIS — I480 Paroxysmal atrial fibrillation: Secondary | ICD-10-CM | POA: Diagnosis present

## 2023-04-02 DIAGNOSIS — Z888 Allergy status to other drugs, medicaments and biological substances status: Secondary | ICD-10-CM

## 2023-04-02 DIAGNOSIS — I4892 Unspecified atrial flutter: Secondary | ICD-10-CM | POA: Diagnosis present

## 2023-04-02 DIAGNOSIS — E785 Hyperlipidemia, unspecified: Secondary | ICD-10-CM | POA: Diagnosis present

## 2023-04-02 DIAGNOSIS — I509 Heart failure, unspecified: Principal | ICD-10-CM

## 2023-04-02 DIAGNOSIS — Z7901 Long term (current) use of anticoagulants: Secondary | ICD-10-CM

## 2023-04-02 DIAGNOSIS — I5033 Acute on chronic diastolic (congestive) heart failure: Secondary | ICD-10-CM | POA: Diagnosis not present

## 2023-04-02 DIAGNOSIS — R001 Bradycardia, unspecified: Secondary | ICD-10-CM | POA: Diagnosis not present

## 2023-04-02 DIAGNOSIS — T502X5A Adverse effect of carbonic-anhydrase inhibitors, benzothiadiazides and other diuretics, initial encounter: Secondary | ICD-10-CM | POA: Diagnosis not present

## 2023-04-02 DIAGNOSIS — I70201 Unspecified atherosclerosis of native arteries of extremities, right leg: Secondary | ICD-10-CM | POA: Diagnosis present

## 2023-04-02 DIAGNOSIS — I482 Chronic atrial fibrillation, unspecified: Secondary | ICD-10-CM | POA: Diagnosis not present

## 2023-04-02 DIAGNOSIS — F419 Anxiety disorder, unspecified: Secondary | ICD-10-CM | POA: Diagnosis present

## 2023-04-02 DIAGNOSIS — Z801 Family history of malignant neoplasm of trachea, bronchus and lung: Secondary | ICD-10-CM

## 2023-04-02 DIAGNOSIS — Z806 Family history of leukemia: Secondary | ICD-10-CM

## 2023-04-02 DIAGNOSIS — R0602 Shortness of breath: Secondary | ICD-10-CM | POA: Diagnosis present

## 2023-04-02 HISTORY — DX: Transient cerebral ischemic attack, unspecified: G45.9

## 2023-04-02 LAB — BASIC METABOLIC PANEL
Anion gap: 14 (ref 5–15)
BUN: 14 mg/dL (ref 8–23)
CO2: 27 mmol/L (ref 22–32)
Calcium: 8.5 mg/dL — ABNORMAL LOW (ref 8.9–10.3)
Chloride: 101 mmol/L (ref 98–111)
Creatinine, Ser: 0.64 mg/dL (ref 0.61–1.24)
GFR, Estimated: >60 mL/min (ref >60)
Glucose, Bld: 124 mg/dL — ABNORMAL HIGH (ref 70–99)
Potassium: 3.8 mmol/L (ref 3.5–5.1)
Sodium: 142 mmol/L (ref 135–145)

## 2023-04-02 LAB — CBC WITH DIFFERENTIAL/PLATELET
Abs Immature Granulocytes: 0.06 10*3/uL (ref 0.00–0.07)
Basophils Absolute: 0.1 10*3/uL (ref 0.0–0.1)
Basophils Relative: 0 %
Eosinophils Absolute: 0.1 10*3/uL (ref 0.0–0.5)
Eosinophils Relative: 1 %
HCT: 35.3 % — ABNORMAL LOW (ref 39.0–52.0)
Hemoglobin: 10.4 g/dL — ABNORMAL LOW (ref 13.0–17.0)
Immature Granulocytes: 0 %
Lymphocytes Relative: 14 %
Lymphs Abs: 2.1 10*3/uL (ref 0.7–4.0)
MCH: 23.7 pg — ABNORMAL LOW (ref 26.0–34.0)
MCHC: 29.5 g/dL — ABNORMAL LOW (ref 30.0–36.0)
MCV: 80.4 fL (ref 80.0–100.0)
Monocytes Absolute: 1.4 10*3/uL — ABNORMAL HIGH (ref 0.1–1.0)
Monocytes Relative: 9 %
Neutro Abs: 11.2 10*3/uL — ABNORMAL HIGH (ref 1.7–7.7)
Neutrophils Relative %: 76 %
Platelets: 450 10*3/uL — ABNORMAL HIGH (ref 150–400)
RBC: 4.39 MIL/uL (ref 4.22–5.81)
RDW: 17.7 % — ABNORMAL HIGH (ref 11.5–15.5)
WBC: 15 10*3/uL — ABNORMAL HIGH (ref 4.0–10.5)
nRBC: 0 % (ref 0.0–0.2)

## 2023-04-02 LAB — RESPIRATORY PANEL BY PCR

## 2023-04-02 LAB — TROPONIN I (HIGH SENSITIVITY)
Troponin I (High Sensitivity): 7 ng/L (ref <18)
Troponin I (High Sensitivity): 7 ng/L (ref <18)

## 2023-04-02 LAB — I-STAT CG4 LACTIC ACID, ED: Lactic Acid, Venous: 2.2 mmol/L (ref 0.5–1.9)

## 2023-04-02 LAB — MRSA NEXT GEN BY PCR, NASAL: MRSA by PCR Next Gen: NOT DETECTED

## 2023-04-02 LAB — PROCALCITONIN: Procalcitonin: <0.1 ng/mL

## 2023-04-02 LAB — STREP PNEUMONIAE URINARY ANTIGEN: Strep Pneumo Urinary Antigen: NEGATIVE

## 2023-04-02 LAB — SARS CORONAVIRUS 2 BY RT PCR: SARS Coronavirus 2 by RT PCR: NEGATIVE

## 2023-04-02 LAB — BRAIN NATRIURETIC PEPTIDE: B Natriuretic Peptide: 645.5 pg/mL — ABNORMAL HIGH (ref 0.0–100.0)

## 2023-04-02 MED ORDER — INSULIN DEGLUDEC 100 UNIT/ML ~~LOC~~ SOPN: 10.0000 [IU] | PEN_INJECTOR | Freq: Every day | SUBCUTANEOUS | Status: DC

## 2023-04-02 MED ORDER — OXYCODONE HCL 5 MG PO TABS
5.0000 mg | ORAL_TABLET | ORAL | Status: DC | PRN
  Administered 2023-04-02 – 2023-04-07 (×18): 5 mg via ORAL
  Filled 2023-04-02 (×17): qty 1

## 2023-04-02 MED ORDER — OXYCODONE-ACETAMINOPHEN 10-325 MG PO TABS: 1.0000 | ORAL_TABLET | ORAL | Status: DC | PRN

## 2023-04-02 MED ORDER — ACETAMINOPHEN 325 MG PO TABS
650.0000 mg | ORAL_TABLET | Freq: Four times a day (QID) | ORAL | Status: DC | PRN
  Administered 2023-04-05: 650 mg via ORAL
  Filled 2023-04-02 (×2): qty 2

## 2023-04-02 MED ORDER — FUROSEMIDE 10 MG/ML IJ SOLN
40.0000 mg | Freq: Two times a day (BID) | INTRAMUSCULAR | Status: DC
  Administered 2023-04-03 – 2023-04-05 (×5): 40 mg via INTRAVENOUS
  Filled 2023-04-02 (×5): qty 4

## 2023-04-02 MED ORDER — ACETAMINOPHEN 650 MG RE SUPP: 650.0000 mg | Freq: Four times a day (QID) | RECTAL | Status: DC | PRN

## 2023-04-02 MED ORDER — INSULIN GLARGINE-YFGN 100 UNIT/ML ~~LOC~~ SOLN
10.0000 [IU] | Freq: Every day | SUBCUTANEOUS | Status: DC
  Administered 2023-04-03 – 2023-04-08 (×6): 10 [IU] via SUBCUTANEOUS
  Filled 2023-04-02 (×6): qty 0.1

## 2023-04-02 MED ORDER — OXYCODONE-ACETAMINOPHEN 5-325 MG PO TABS
1.0000 | ORAL_TABLET | ORAL | Status: DC | PRN
  Administered 2023-04-02 – 2023-04-08 (×21): 1 via ORAL
  Filled 2023-04-02 (×21): qty 1

## 2023-04-02 MED ORDER — SERTRALINE HCL 50 MG PO TABS
50.0000 mg | ORAL_TABLET | Freq: Every day | ORAL | Status: DC
  Administered 2023-04-03 – 2023-04-08 (×6): 50 mg via ORAL
  Filled 2023-04-02 (×6): qty 1

## 2023-04-02 MED ORDER — EMPAGLIFLOZIN 10 MG PO TABS
10.0000 mg | ORAL_TABLET | Freq: Every day | ORAL | Status: DC
  Administered 2023-04-03 – 2023-04-08 (×6): 10 mg via ORAL
  Filled 2023-04-02 (×6): qty 1

## 2023-04-02 MED ORDER — APIXABAN 5 MG PO TABS
5.0000 mg | ORAL_TABLET | Freq: Two times a day (BID) | ORAL | Status: DC
  Administered 2023-04-02 – 2023-04-08 (×12): 5 mg via ORAL
  Filled 2023-04-02 (×12): qty 1

## 2023-04-02 MED ORDER — FUROSEMIDE 10 MG/ML IJ SOLN
40.0000 mg | Freq: Once | INTRAMUSCULAR | Status: AC
  Administered 2023-04-02: 40 mg via INTRAVENOUS
  Filled 2023-04-02: qty 4

## 2023-04-02 MED ORDER — AMIODARONE HCL 200 MG PO TABS
200.0000 mg | ORAL_TABLET | Freq: Every day | ORAL | Status: DC
  Administered 2023-04-03 – 2023-04-08 (×6): 200 mg via ORAL
  Filled 2023-04-02 (×6): qty 1

## 2023-04-02 MED ORDER — SODIUM CHLORIDE 0.9% FLUSH
3.0000 mL | Freq: Two times a day (BID) | INTRAVENOUS | Status: DC
  Administered 2023-04-02 – 2023-04-08 (×12): 3 mL via INTRAVENOUS

## 2023-04-02 MED ORDER — HYDRALAZINE HCL 20 MG/ML IJ SOLN: 10.0000 mg | INTRAMUSCULAR | Status: DC | PRN

## 2023-04-02 MED ORDER — ONDANSETRON HCL 4 MG/2ML IJ SOLN: 4.0000 mg | Freq: Four times a day (QID) | INTRAMUSCULAR | Status: DC | PRN

## 2023-04-02 MED ORDER — PANTOPRAZOLE SODIUM 40 MG PO TBEC
40.0000 mg | DELAYED_RELEASE_TABLET | Freq: Every day | ORAL | Status: DC
  Administered 2023-04-03 – 2023-04-08 (×6): 40 mg via ORAL
  Filled 2023-04-02 (×6): qty 1

## 2023-04-02 MED ORDER — ROSUVASTATIN CALCIUM 20 MG PO TABS
40.0000 mg | ORAL_TABLET | Freq: Every day | ORAL | Status: DC
  Administered 2023-04-03 – 2023-04-08 (×6): 40 mg via ORAL
  Filled 2023-04-02 (×6): qty 2

## 2023-04-02 MED ORDER — ALBUTEROL SULFATE (2.5 MG/3ML) 0.083% IN NEBU: 2.5000 mg | INHALATION_SOLUTION | RESPIRATORY_TRACT | Status: DC | PRN

## 2023-04-02 MED ORDER — DOXYCYCLINE HYCLATE 100 MG PO TABS
100.0000 mg | ORAL_TABLET | Freq: Two times a day (BID) | ORAL | Status: AC
  Administered 2023-04-02 – 2023-04-05 (×7): 100 mg via ORAL
  Filled 2023-04-02 (×7): qty 1

## 2023-04-02 MED ORDER — INSULIN ASPART 100 UNIT/ML IJ SOLN
0.0000 [IU] | Freq: Three times a day (TID) | INTRAMUSCULAR | Status: DC
  Administered 2023-04-03 – 2023-04-05 (×3): 3 [IU] via SUBCUTANEOUS
  Administered 2023-04-05: 2 [IU] via SUBCUTANEOUS
  Administered 2023-04-06 – 2023-04-07 (×3): 3 [IU] via SUBCUTANEOUS
  Administered 2023-04-07: 2 [IU] via SUBCUTANEOUS
  Administered 2023-04-08: 3 [IU] via SUBCUTANEOUS
  Administered 2023-04-08: 2 [IU] via SUBCUTANEOUS

## 2023-04-02 MED ORDER — METOPROLOL SUCCINATE ER 25 MG PO TB24
25.0000 mg | ORAL_TABLET | Freq: Every day | ORAL | Status: DC
  Administered 2023-04-04 – 2023-04-05 (×2): 25 mg via ORAL
  Filled 2023-04-02 (×3): qty 1

## 2023-04-02 MED ORDER — NICOTINE 14 MG/24HR TD PT24
14.0000 mg | MEDICATED_PATCH | Freq: Every day | TRANSDERMAL | Status: DC
  Administered 2023-04-05 – 2023-04-08 (×4): 14 mg via TRANSDERMAL
  Filled 2023-04-02 (×5): qty 1

## 2023-04-02 MED ORDER — ONDANSETRON HCL 4 MG PO TABS: 4.0000 mg | ORAL_TABLET | Freq: Four times a day (QID) | ORAL | Status: DC | PRN

## 2023-04-02 NOTE — ED Provider Triage Note (Signed)
Emergency Medicine Provider Triage Evaluation Note  Troy [name] , a 62 y.o. male  was evaluated in triage.  Pt complains of SOB. recently admitted to the hospital on 9/20 to 10/3 for acute respiratory failure thought to be secondary to PNA vs. Pulmonary edema. Denies cough and fever. History of CHF and COPD. Denies wheeze. Currently in rehab and placed on 4L [place] yesterday. Not on chronic oxygen. Denies chest pain.  Review of Systems  Positive: SOB Negative: fever  Physical Exam  Ht 5\' 11"  (1.803 m)   Wt 79.4 kg   BMI 24.41 kg/m  Gen:   Awake, no distress   Resp:  Normal effort  MSK:   Moves extremities without difficulty  Other:    Medical Decision Making  Medically screening exam initiated at 11:49 AM.  Appropriate orders placed.  Troy [name] was informed that the remainder of the evaluation will be completed by another provider, this initial triage assessment does not replace that evaluation, and the importance of remaining in the ED until their evaluation is complete.  Labs, CXR, EKG   Troy [name], [place] 04/02/23 1152

## 2023-04-02 NOTE — H&P (Signed)
History and Physical    Patient: Troy [name] WUJ:[id number] DOB: 01-21-1961 DOA: 04/02/2023 DOS: the patient was seen and examined on 04/02/2023 PCP: Rebecka Apley, NP  Patient coming from: Home  Chief Complaint:  Chief Complaint  Patient presents with   Shortness of Breath   HPI: Troy [name] is a 62 y.o. male with medical history significant of heart failure with preserved ejection fraction, paroxysmal atrial flutter, peripheral vascular disease s/p stenting of the right femoral artery on 8/12, COPD, cirrhosis who presents with complaints of shortness of breath.    Patient had just recently been hospitalized from 9/20-10/3.  He underwent plain right BKA by Dr. Lajoyce Corners after being found to have concern for infection with dehiscence of his prior right transmetatarsal amputation.  Following the procedure patient was found to be in atrial flutter and had been taken to the ICU temporarily due to hypotension after receiving Cardizem.  He was noted to have acute hypoxic respiratory failure thought possibly secondary to pneumonia versus pulmonary edema versus amiodarone induced lung toxicity.  He was treated with antibiotics initially, but procalcitonin was less than 0.1 for which it was thought to be a noninfectious cause.  He was given Lasix IV for 3 days and placed on a steroid taper with improvement.  He had been sent to [facility] rehab on oxygen 2 L of oxygen.  Patient denies having any recent fever or chills, but wife makes noticed that he was clammy and had been having increased labored breathing.  Patient makes note that he has had a mostly dry intermittent cough.  Denies having any redness or significant drainage coming from the wound of his below-knee amputation on the right.  At the facility they had increased his oxygen to 5 L.  He also reported having   In the emergency department patient was noted to be afebrile with pulse 52, blood pressure of 153/76, and O2 saturations currently  maintained on 4 L of oxygen.  Labs significant for WBC 15, hemoglobin 10.4, platelets 450, high-sensitivity troponin negative x 1, BNP 645.5, and lactic acid 2.2. Chest x-ray noted moderate to severe bilateral airspace opacities that appear to be increased from prior.  Patient had been given Lasix 40 mg IV.   Review of Systems: As mentioned in the history of present illness. All other systems reviewed and are negative. Past Medical History:  Diagnosis Date   Acute renal failure (HCC)    in setting of NSAID use and orthopedic surgery 2010   Anxiety and depression    Chronic diastolic CHF (congestive heart failure) (HCC)    a. Echo 6/17: severe conc LVH, vigorous EF, EF 65-70%, no dynamic obstruction, no RWMA, Gr 1 DD, mild TR  //  b. 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 LHC 2002: irregs  //  b. LHC in 8/17: no sig CAD, apical DK, hyperdynamic LV, LVEDP 28   History of kidney stones    History of nuclear stress test    a. Nuc 7/17: Overall, intermediate risk nuclear stress  test secondary to small size of apical lateral defect and reduced ejection fraction.  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    Kidney calculi    Palpitations    Pneumonia    PTSD (post-traumatic stress disorder)    Tobacco abuse    Past Surgical History:  Procedure Laterality Date   ABDOMINAL AORTOGRAM W/LOWER EXTREMITY N/A 01/31/2023   Procedure: ABDOMINAL AORTOGRAM W/LOWER EXTREMITY;  Surgeon: Maeola Harman, MD;  Location: [facility] INVASIVE CV LAB;   Service: Cardiovascular;  Laterality: N/A;   AMPUTATION Left 01/03/2018   Procedure: LEFT MIDFOOT AMPUTATION/REVISION MIDAMPUTAION;  Surgeon: Kathryne Hitch, MD;  Location: [facility] OR;  Service: Orthopedics;  Laterality: Left;   AMPUTATION Left 01/25/2018   Procedure: LEFT BELOW KNEE AMPUTATION;  Surgeon: Nadara Mustard, MD;  Location: [facility] OR;  Service: Orthopedics;  Laterality: Left;   AMPUTATION Left 01/21/2021   Procedure: LEFT ABOVE KNEE AMPUTATION;  Surgeon: Nadara Mustard, MD;  Location: [facility] OR;  Service: Orthopedics;  Laterality: Left;   AMPUTATION Right 02/09/2023   Procedure: RIGHT TRANSMETATARSAL AMPUTATION;  Surgeon: Nadara Mustard, MD;  Location: [facility] OR;  Service: Orthopedics;  Laterality: Right;   AMPUTATION Right 03/11/2023   Procedure: RIGHT BELOW KNEE AMPUTATION;  Surgeon: Nadara Mustard, MD;  Location: [facility] OR;  Service: Orthopedics;  Laterality: Right;   AMPUTATION TOE Right 07/17/2019   Procedure: AMPUTATION RIGHT FOOT 2ND TOE;  Surgeon: Kathryne Hitch, MD;  Location:  SURGERY CENTER;  Service: Orthopedics;  Laterality: Right;   APPLICATION OF WOUND VAC Left 01/21/2021   Procedure: APPLICATION OF WOUND VAC;  Surgeon: Nadara Mustard, MD;  Location: [facility] OR;  Service: Orthopedics;  Laterality: Left;   BELOW KNEE LEG AMPUTATION Left 01/25/2018   CARDIAC CATHETERIZATION N/A 01/22/2016   Procedure: Left Heart Cath and Coronary Angiography;  Surgeon: Peter M Swaz[place], MD;  Location: [facility] INVASIVE CV LAB;  Service: Cardiovascular;  Laterality: N/A;   CARDIOVERSION N/A 03/17/2023   Procedure: CARDIOVERSION;  Surgeon: Chrystie Nose, MD;  Location: [facility] INVASIVE CV LAB;  Service: Cardiovascular;  Laterality: N/A;   FOOT AMPUTATION Bilateral    I & D EXTREMITY Left 12/15/2017   Procedure: IRRIGATION AND DEBRIDEMENT LEFT FOOT ULCER;  Surgeon: Kathryne Hitch, MD;  Location: [facility] ORS;  Service: Orthopedics;  Laterality: Left;   I & D EXTREMITY Left 07/25/2020   Procedure: LEFT BELOW  KNEE AMPUTATION ABSCESS EXCISION AND SKIN GRAFT;  Surgeon: Nadara Mustard, MD;  Location: [facility] OR;  Service: Orthopedics;  Laterality: Left;   I & D EXTREMITY Left 08/22/2020   Procedure: DEBRIDEMENT LEFT BELOW KNEE AMPUTATION AND APPLY KERECIS SKIN GRAFT;  Surgeon: Nadara Mustard, MD;  Location: [facility] OR;  Service: Orthopedics;  Laterality: Left;   LITHOTRIPSY     PERIPHERAL VASCULAR INTERVENTION  01/31/2023   Procedure: PERIPHERAL VASCULAR INTERVENTION;  Surgeon: Maeola Harman, MD;  Location: [facility] INVASIVE CV LAB;  Service: Cardiovascular;;   TEE WITHOUT CARDIOVERSION N/A 03/17/2023   Procedure: TRANSESOPHAGEAL ECHOCARDIOGRAM;  Surgeon: Chrystie Nose, MD;  Location: [facility] INVASIVE CV LAB;  Service: Cardiovascular;  Laterality: N/A;   TENDON LENGTHENING Bilateral    calf   TONSILLECTOMY     Social History:  reports that he has been smoking cigarettes. He has a 40 pack-year smoking history. He has  never used smokeless tobacco. He reports that he does not drink alcohol and does not use drugs.  Allergies  Allergen Reactions   Hydromorphone Hcl Er Anaphylaxis and Other (See Comments)    Allergic to DYE in extended-release tablet, can tolerate other forms of hydromorphone   Tapentadol Anaphylaxis, Swelling and Other (See Comments)    THROAT ANGIOEDEMA Nucynta [Tapentadol Hydrochloride]   Exalamide Other (See Comments)    UNSPECIFIED REACTION     Family History  Problem Relation Age of Onset   Leukemia Mother 70       died   Lung cancer Father 33       died   Heart attack Brother 52   Heart attack Brother 77   Hypertension Brother        X3   Hypertension Sister        X2   Diabetes Sister    Stroke Sister    Diabetes Sister    Other Brother        Set designer accident    Prior to Admission medications   Medication Sig Start Date End Date Taking? Authorizing Provider  amiodarone (PACERONE) 200 MG tablet Take 2 tablets (400 mg total) by mouth 2 (two) times daily for 8 days, THEN 1  tablet (200 mg total) daily. 03/23/23 03/30/24  Joseph Art, DO  apixaban (ELIQUIS) 5 MG TABS tablet Take 1 tablet (5 mg total) by mouth 2 (two) times daily. 02/16/23 05/17/23  [place], Eric J, DO  Continuous Glucose Receiver (FREESTYLE LIBRE 3 READER) DEVI Use as directed with lifestyle libre sensors 02/16/23   [place], Alvira Philips, DO  Continuous Glucose Sensor (FREESTYLE LIBRE 3 SENSOR) MISC Place 1 sensor on the skin every 14 days. Use to check glucose continuously 02/16/23   [place], Alvira Philips, DO  doxycycline (VIBRA-TABS) 100 MG tablet Take 1 tablet (100 mg total) by mouth 2 (two) times daily. 03/15/23   Nadara Mustard, MD  empagliflozin ([name]) 10 MG TABS tablet Take 1 tablet (10 mg total) by mouth daily before breakfast. 02/23/23 08/22/23  Azalee Course, PA  furosemide (LASIX) 40 MG tablet Take 1 tablet (40 mg total) by mouth daily for 2 days. 03/23/23 03/25/23  Joseph Art, DO  glipiZIDE (GLUCOTROL) 10 MG tablet Take 10 mg by mouth 2 (two) times daily. 03/03/23   [provider]  insulin aspart (NOVOLOG) 100 UNIT/ML injection Inject 4 Units into the skin 3 (three) times daily with meals. 03/24/23   Joseph Art, DO  insulin aspart (NOVOLOG) 100 UNIT/ML injection Inject 0-15 Units into the skin 3 (three) times daily with meals. 03/24/23   Joseph Art, DO  insulin glargine-yfgn (SEMGLEE) 100 UNIT/ML injection Inject 0.15 mLs (15 Units total) into the skin daily. 03/24/23   Joseph Art, DO  Insulin Pen Needle (PEN NEEDLES 3/16") 31G X 5 MM MISC Use as directed with insulin pen 02/16/23   [place], Alvira Philips, DO  metoprolol succinate (TOPROL-XL) 25 MG 24 hr tablet Take 1 tablet (25 mg total) by mouth daily. 03/23/23   Joseph Art, DO  oxyCODONE-acetaminophen (PERCOCET) 10-325 MG tablet Take 1 tablet by mouth every 4 (four) hours as needed for pain. 03/23/23   Nadara Mustard, MD  pantoprazole (PROTONIX) 40 MG tablet Take 1 tablet (40 mg total) by mouth daily. 02/17/23 05/18/23  [place], Alvira Philips, DO   rosuvastatin (CRESTOR) 40 MG tablet Take 1 tablet (40 mg total) by mouth daily. 03/23/23   Joseph Art,  DO    Physical Exam: Vitals:   04/02/23 1136 04/02/23 1150  BP:  (!) 153/76  Pulse:  (!) 52  Resp:  18  Temp:  97.7 F (36.5 C)  TempSrc:  Oral  SpO2:  100%  Weight: 79.4 kg   Height: 5\' 11"  (1.803 m)      Constitutional: Older adult male who appears to be in no acute distress at this time Eyes: PERRL, lids and conjunctivae normal ENMT: Mucous membranes are moist.  Normal dentition.  Neck: normal, supple  Respiratory: Normal respiratory effort with crackles appreciated in both lung fields.  Patient currently on 4 L of oxygen with O2 saturations maintained. Cardiovascular: Regular rate and rhythm, no murmurs / rubs / gallops. No extremity edema. 2+ pedal pulses. No carotid bruits.  Abdomen: no tenderness, no masses palpated. No hepatosplenomegaly. Bowel sounds positive.  Musculoskeletal: no clubbing / cyanosis.  Status post right BKA and left AKA. Skin: Healing wound of the right lower extremity with no significant erythema and staples still present. Neurologic: CN 2-12 grossly intact.  Strength 5/5 in all 4.  Psychiatric: Normal judgment and insight. Alert and oriented x 3. Normal mood.   Data Reviewed:  EKG revealed normal sinus rhythm at 73 bpm.  Reviewed labs, imaging, and pertinent records as documented  Assessment and Plan:  Respiratory failure with hypoxia secondary to heart failure with preserved ejection fraction Acute on chronic.  Patient presented with complaints of progressively worsening shortness of breath with reports of mild intermittent dry cough.  Chest x-ray noted increased moderate to severe bilateral airspace opacities.  BNP elevated at 645.5 which was slightly higher than during previous hospitalization.  Last echocardiogram noted EF to be 70 to 75% with left ventricle noted to have hyperdynamic function and moderate left ventricular hypertrophy.   Patient had been given Lasix 40 mg IV.  Suspect symptoms secondary to pulmonary edema. -Admit to a cardiac telemetry bed -Heart failure order set utilized -Continuous pulse oximetry with nasal cannula oxygen to maintain O2 saturation greater than 92%. -Strict I&O's and daily weights -Lasix 40 mg IV twice daily   Lactic acidosis Leukocytosis Acute.  White blood cell count was elevated at 15 and lactic acid 2.2.  19 and complete respiratory virus panel were negative. Possibly reactive secondary to hypoperfusion in setting of CHF.   -Check blood cultures -Check procalcitonin(<0.1) -Will monitor off of antibiotics at this time  COPD Patient without significant wheezing appreciated on physical exam.  During last hospitalization patient had been placed on steroids with some improvement in his breathing reported.  There was note of concern for possibility of a amiodarone induced lung toxicity. -Albuterol nebs as needed for shortness of breath/wheezing  Paroxysmal atrial flutter on chronic anticoagulation Patient had been restarted on amiodarone during prior hospitalization although this was noted to be a possible concern for amiodarone induced lung toxicity.  Patient currently appears to be in sinus rhythm. -Continue amiodarone, metoprolol, Eliquis  Uncontrolled diabetes mellitus type 2, with long-term use of insulin Last hemoglobin A1c was 11.7 on 8/17. -Hypoglycemia protocols -Hold glipizide -Continue [name] -Continue pharmacy substitution for Tresiba 10 units. -CBGs before every meal with moderate SSI -Adjust insulin regimen as needed  Hypochromic anemia Hemoglobin 10.4 g/dL which appears slightly higher than most recent discharge from 9.1. -Recheck CBC tomorrow morning.  Essential hypertension -Continue current blood pressure regimen  Peripheral vascular disease S/p right BKA Patient  was s/p stenting of the right femoral artery on 8/12 and subsequently had to have  right BKA  performed on 9/20 by Dr. Lajoyce Corners.  Patient previously had left AKA performed back in 2022. -Continue Crestor  Thrombocytosis Acute.  Platelet count elevated at 450.  Possibly reactive to above. -Continue to monitor  GERD -Continue Protonix  Tobacco abuse -Continue nicotine patch  DVT prophylaxis: Eliquis Advance Care Planning:   Code Status: Full Code   Consults: None  Family Communication: Wife updated at bedside  Severity of Illness: The appropriate patient status for this patient is INPATIENT. Inpatient status is judged to be reasonable and necessary in order to provide the required intensity of service to ensure the patient's safety. The patient's presenting symptoms, physical exam findings, and initial radiographic and laboratory data in the context of their chronic comorbidities is felt to place them at high risk for further clinical deterioration. Furthermore, it is not anticipated that the patient will be medically stable for discharge from the hospital within 2 midnights of admission.   * I certify that at the point of admission it is my clinical judgment that the patient will require inpatient hospital care spanning beyond 2 midnights from the point of admission due to high intensity of service, high risk for further deterioration and high frequency of surveillance required.*  Author: Clydie Braun, MD 04/02/2023 2:12 PM  For on call review [email or web address].

## 2023-04-02 NOTE — ED Triage Notes (Signed)
Patient present to ed via [facility] states he has been having sob onset yest , states he was put on o2 yest 4 liters /NCdenies pain , hx. Left AKA, Right BKA, no cough
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
## 2023-04-03 DIAGNOSIS — J9601 Acute respiratory failure with hypoxia: Secondary | ICD-10-CM

## 2023-04-03 DIAGNOSIS — I4892 Unspecified atrial flutter: Secondary | ICD-10-CM | POA: Diagnosis not present

## 2023-04-03 DIAGNOSIS — I5033 Acute on chronic diastolic (congestive) heart failure: Secondary | ICD-10-CM

## 2023-04-03 DIAGNOSIS — D72829 Elevated white blood cell count, unspecified: Secondary | ICD-10-CM

## 2023-04-03 LAB — GLUCOSE, CAPILLARY
Glucose-Capillary: 89 mg/dL (ref 70–99)
Glucose-Capillary: 84 mg/dL (ref 70–99)
Glucose-Capillary: 173 mg/dL — ABNORMAL HIGH (ref 70–99)
Glucose-Capillary: 188 mg/dL — ABNORMAL HIGH (ref 70–99)

## 2023-04-03 LAB — BASIC METABOLIC PANEL
Anion gap: 11 (ref 5–15)
BUN: 14 mg/dL (ref 8–23)
CO2: 28 mmol/L (ref 22–32)
Calcium: 8.2 mg/dL — ABNORMAL LOW (ref 8.9–10.3)
Chloride: 102 mmol/L (ref 98–111)
Creatinine, Ser: 0.52 mg/dL — ABNORMAL LOW (ref 0.61–1.24)
GFR, Estimated: >60 mL/min (ref >60)
Glucose, Bld: 64 mg/dL — ABNORMAL LOW (ref 70–99)
Potassium: 3 mmol/L — ABNORMAL LOW (ref 3.5–5.1)
Sodium: 141 mmol/L (ref 135–145)

## 2023-04-03 LAB — CBC
HCT: 33.1 % — ABNORMAL LOW (ref 39.0–52.0)
Hemoglobin: 9.8 g/dL — ABNORMAL LOW (ref 13.0–17.0)
MCH: 23.7 pg — ABNORMAL LOW (ref 26.0–34.0)
MCHC: 29.6 g/dL — ABNORMAL LOW (ref 30.0–36.0)
MCV: 80 fL (ref 80.0–100.0)
Platelets: 413 10*3/uL — ABNORMAL HIGH (ref 150–400)
RBC: 4.14 MIL/uL — ABNORMAL LOW (ref 4.22–5.81)
RDW: 17.7 % — ABNORMAL HIGH (ref 11.5–15.5)
WBC: 15 10*3/uL — ABNORMAL HIGH (ref 4.0–10.5)
nRBC: 0 % (ref 0.0–0.2)

## 2023-04-03 MED ORDER — POTASSIUM CHLORIDE CRYS ER 20 MEQ PO TBCR
40.0000 meq | EXTENDED_RELEASE_TABLET | Freq: Two times a day (BID) | ORAL | Status: AC
  Administered 2023-04-03 (×2): 40 meq via ORAL
  Filled 2023-04-03 (×2): qty 2

## 2023-04-03 NOTE — Evaluation (Signed)
Physical Therapy Evaluation Patient Details Name: Troy [name] MRN: [id number] DOB: November 21, 1960 Today's Date: 04/03/2023  History of Present Illness  Pt is a 62 yo presenting from nursing facility with shortness of breathe. Pt diagnosed with Heart failure with preserved ejection fraction. 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 S/p TEE and cardioversion for aflutter on 9/26. PMH: CHF, COPD, DM2, HTN, restless leg syndrome, PTSD, RLE trans met amputation, L AKA  Clinical Impression  Pt is presenting below baseline level of functioning. Currently pt is supervision for bed mobility and scooting EOB. Pt deferred OOB this session. Pt HR and O2 sats remained WNL throughout sess with pt on 3L o2 via [place]. Initially spouse not present and pt is wanting to return home but spouse has major concerns about her ability to manage with pt at home. Spouse works and is not home all day; she is also unable to lift patient. Spouse states pt has not been performing transfers ind at rehab yet.   Due to pt current functional status, home set up and available assistance at home recommending skilled physical therapy services < 3 hours/day on discharge from acute care hospital setting.      If plan is discharge home, recommend the following: A little help with walking and/or transfers;Assistance with cooking/housework   Can travel by private vehicle   No    Equipment Recommendations None recommended by PT     Functional Status Assessment Patient has had a recent decline in their functional status and demonstrates the ability to make significant improvements in function in a reasonable and predictable amount of time.     Precautions / Restrictions Precautions Precautions: Fall Precaution Comments: R BKA, L AKA Other Brace: shrinker sock Restrictions Weight Bearing Restrictions: No      Mobility  Bed Mobility Overal bed mobility: Needs  Assistance Bed Mobility: Supine to Sit, Sit to Supine     Supine to sit: Supervision, Used rails, HOB elevated Sit to supine: Supervision        Transfers Overall transfer level: Needs assistance Equipment used: None        Lateral/Scoot Transfers: Supervision General transfer comment: pt deferred out of bed. Pt scoot R/L on EOB with increased time and difficulty clearing hips from EOB.    Ambulation/Gait   General Gait Details: unable at this time.      Balance Overall balance assessment: Needs assistance Sitting-balance support: Bilateral upper extremity supported Sitting balance-Leahy Scale: Fair Sitting balance - Comments: UE support needed for sitting balance         Pertinent Vitals/Pain Pain Assessment Pain Assessment: 0-10 Pain Score: 8  Pain Descriptors / Indicators: Aching, Burning, Sharp Pain Intervention(s): Limited activity within patient's tolerance, Monitored during session, Premedicated before session    Home Living Family/patient expects to be discharged to:: Skilled nursing facility Living Arrangements: Spouse/significant other Available Help at Discharge: Family;Available PRN/intermittently Type of Home: House Home Access: Ramped entrance       Home Layout: One level Home Equipment: Rolling Walker (2 wheels);BSC/3in1;Shower seat;Grab bars - tub/shower;Wheelchair - Pharmacist, hospital Comments: bedside commode over top toilet to allow arm rests to push up on    Prior Function Prior Level of Function : Independent/Modified Independent             Mobility Comments: Pt reports Mod I for transfers to W/C; spouse reports he has been using trapeze to get to w/c <> EOB ADLs Comments: Pt  reports Mod I with ADL's; spouse reports pt has been getting assistance     Extremity/Trunk Assessment   Upper Extremity Assessment Upper Extremity Assessment: Generalized weakness    Lower Extremity Assessment Lower Extremity  Assessment: Generalized weakness RLE Deficits / Details: R BKA LLE Deficits / Details: L AKA    Cervical / Trunk Assessment Cervical / Trunk Assessment: Normal  Communication   Communication Communication: No apparent difficulties  Cognition Arousal: Alert Behavior During Therapy: Flat affect Overall Cognitive Status: History of cognitive impairments - at baseline       General Comments: WFL, patient flat and minimally communicative.        General Comments General comments (skin integrity, edema, etc.): Initially saw pt briefly without spouse and pt was reporting mod I; spouse states pt has not been mod I with transfers and continues to need assist or use trapeze bar.        Assessment/Plan    PT Assessment Patient needs continued PT services  PT Problem List Decreased mobility;Pain       PT Treatment Interventions Therapeutic exercise;Balance training;Functional mobility training;Therapeutic activities;Patient/family education;Wheelchair mobility training    PT Goals (Current goals can be found in the Care Plan section)  Acute Rehab PT Goals Patient Stated Goal: Return home with spouse PT Goal Formulation: With patient Time For Goal Achievement: 04/17/23 Potential to Achieve Goals: Good Additional Goals Additional Goal #1: 50 ft of W/C mobility or greater with Mod I and supervision managing w/c parts.    Frequency Min 1X/week        AM-PAC PT "6 Clicks" Mobility  Outcome Measure Help needed turning from your back to your side while in a flat bed without using bedrails?: A Little Help needed moving from lying on your back to sitting on the side of a flat bed without using bedrails?: A Little Help needed moving to and from a bed to a chair (including a wheelchair)?: A Little Help needed standing up from a chair using your arms (e.g., wheelchair or bedside chair)?: A Little Help needed to walk in hospital room?: Total Help needed climbing 3-5 steps with a  railing? : Total 6 Click Score: 14    End of Session Equipment Utilized During Treatment: Oxygen Activity Tolerance: Patient tolerated treatment well;Patient limited by fatigue Patient left: with call bell/phone within reach;in bed Nurse Communication: Mobility status PT Visit Diagnosis: Other abnormalities of gait and mobility (R26.89);Muscle weakness (generalized) (M62.81)    Time: 0981-1914 PT Time Calculation (min) (ACUTE ONLY): 23 min   Charges:   PT Evaluation $PT Eval Low Complexity: 1 Low PT Treatments $Therapeutic Activity: 8-22 mins PT General Charges $$ ACUTE PT VISIT: 1 Visit       Harrel Carina, DPT, CLT  Acute Rehabilitation Services Office: [phone] (Secure chat preferred)   Claudia Desanctis 04/03/2023, 3:46 PM
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
## 2023-04-03 NOTE — Plan of Care (Signed)
Problem: Education: Goal: Knowledge of the prescribed therapeutic regimen will improve Outcome: Progressing Goal: Ability to verbalize activity precautions or restrictions will improve Outcome: Progressing Goal: Understanding of discharge needs will improve Outcome: Progressing   Problem: Activity: Goal: Ability to perform//tolerate increased activity and mobilize with assistive devices will improve Outcome: Progressing   Problem: Clinical Measurements: Goal: Postoperative complications will be avoided or minimized Outcome: Progressing   Problem: Self-Care: Goal: Ability to meet self-care needs will improve Outcome: Progressing   Problem: Self-Concept: Goal: Ability to maintain and perform role responsibilities to the fullest extent possible will improve Outcome: Progressing   Problem: Pain Management: Goal: Pain level will decrease with appropriate interventions Outcome: Progressing   Problem: Skin Integrity: Goal: Demonstration of wound healing without infection will improve Outcome: Progressing   Problem: Education: Goal: Understanding of CV disease, CV risk reduction, and recovery process will improve Outcome: Progressing Goal: Individualized Educational Video(s) Outcome: Progressing   Problem: Activity: Goal: Ability to return to baseline activity level will improve Outcome: Progressing   Problem: Cardiovascular: Goal: Ability to achieve and maintain adequate cardiovascular perfusion will improve Outcome: Progressing Goal: Vascular access site(s) Level 0-1 will be maintained Outcome: Progressing   Problem: Health Behavior/Discharge Planning: Goal: Ability to safely manage health-related needs after discharge will improve Outcome: Progressing   Problem: Education: Goal: Ability to demonstrate management of disease process will improve Outcome: Progressing Goal: Ability to verbalize understanding of medication therapies will improve Outcome:  Progressing Goal: Individualized Educational Video(s) Outcome: Progressing   Problem: Activity: Goal: Capacity to carry out activities will improve Outcome: Progressing   Problem: Cardiac: Goal: Ability to achieve and maintain adequate cardiopulmonary perfusion will improve Outcome: Progressing   Problem: Education: Goal: Ability to describe self-care measures that may prevent or decrease complications (Diabetes Survival Skills Education) will improve Outcome: Progressing Goal: Individualized Educational Video(s) Outcome: Progressing   Problem: Coping: Goal: Ability to adjust to condition or change in health will improve Outcome: Progressing   Problem: Fluid Volume: Goal: Ability to maintain a balanced intake and output will improve Outcome: Progressing   Problem: Health Behavior/Discharge Planning: Goal: Ability to identify and utilize available resources and services will improve Outcome: Progressing Goal: Ability to manage health-related needs will improve Outcome: Progressing   Problem: Metabolic: Goal: Ability to maintain appropriate glucose levels will improve Outcome: Progressing   Problem: Nutritional: Goal: Maintenance of adequate nutrition will improve Outcome: Progressing Goal: Progress toward achieving an optimal weight will improve Outcome: Progressing   Problem: Skin Integrity: Goal: Risk for impaired skin integrity will decrease Outcome: Progressing   Problem: Tissue Perfusion: Goal: Adequacy of tissue perfusion will improve Outcome: Progressing   Problem: Education: Goal: Knowledge of General Education information will improve Description: Including pain rating scale, medication(s)/side effects and non-pharmacologic comfort measures Outcome: Progressing   Problem: Health Behavior/Discharge Planning: Goal: Ability to manage health-related needs will improve Outcome: Progressing   Problem: Clinical Measurements: Goal: Ability to maintain  clinical measurements within normal limits will improve Outcome: Progressing Goal: Will remain free from infection Outcome: Progressing Goal: Diagnostic test results will improve Outcome: Progressing Goal: Respiratory complications will improve Outcome: Progressing Goal: Cardiovascular complication will be avoided Outcome: Progressing   Problem: Activity: Goal: Risk for activity intolerance will decrease Outcome: Progressing   Problem: Nutrition: Goal: Adequate nutrition will be maintained Outcome: Progressing   Problem: Coping: Goal: Level of anxiety will decrease Outcome: Progressing   Problem: Elimination: Goal: Will not experience complications related to bowel motility Outcome: Progressing Goal: Will not experience complications  related to urinary retention Outcome: Progressing   Problem: Pain Managment: Goal: General experience of comfort will improve Outcome: Progressing   Problem: Safety: Goal: Ability to remain free from injury will improve Outcome: Progressing

## 2023-04-04 ENCOUNTER — Ambulatory Visit: Payer: Medicare Other

## 2023-04-04 ENCOUNTER — Inpatient Hospital Stay (HOSPITAL_COMMUNITY): Payer: Medicare Other

## 2023-04-04 ENCOUNTER — Telehealth: Payer: Self-pay | Admitting: Orthopedic Surgery

## 2023-04-04 DIAGNOSIS — D72829 Elevated white blood cell count, unspecified: Secondary | ICD-10-CM | POA: Diagnosis not present

## 2023-04-04 DIAGNOSIS — I5033 Acute on chronic diastolic (congestive) heart failure: Secondary | ICD-10-CM | POA: Diagnosis not present

## 2023-04-04 DIAGNOSIS — I4892 Unspecified atrial flutter: Secondary | ICD-10-CM | POA: Diagnosis not present

## 2023-04-04 LAB — CBC
HCT: 34.6 % — ABNORMAL LOW (ref 39.0–52.0)
Hemoglobin: 10.1 g/dL — ABNORMAL LOW (ref 13.0–17.0)
MCH: 23.3 pg — ABNORMAL LOW (ref 26.0–34.0)
MCHC: 29.2 g/dL — ABNORMAL LOW (ref 30.0–36.0)
MCV: 79.7 fL — ABNORMAL LOW (ref 80.0–100.0)
Platelets: 404 10*3/uL — ABNORMAL HIGH (ref 150–400)
RBC: 4.34 MIL/uL (ref 4.22–5.81)
RDW: 17.6 % — ABNORMAL HIGH (ref 11.5–15.5)
WBC: 16.3 10*3/uL — ABNORMAL HIGH (ref 4.0–10.5)
nRBC: 0 % (ref 0.0–0.2)

## 2023-04-04 LAB — BASIC METABOLIC PANEL
Anion gap: 9 (ref 5–15)
BUN: 19 mg/dL (ref 8–23)
CO2: 32 mmol/L (ref 22–32)
Calcium: 8.4 mg/dL — ABNORMAL LOW (ref 8.9–10.3)
Chloride: 102 mmol/L (ref 98–111)
Creatinine, Ser: 0.72 mg/dL (ref 0.61–1.24)
GFR, Estimated: >60 mL/min (ref >60)
Glucose, Bld: 110 mg/dL — ABNORMAL HIGH (ref 70–99)
Potassium: 3.9 mmol/L (ref 3.5–5.1)
Sodium: 143 mmol/L (ref 135–145)

## 2023-04-04 LAB — GLUCOSE, CAPILLARY
Glucose-Capillary: 107 mg/dL — ABNORMAL HIGH (ref 70–99)
Glucose-Capillary: 120 mg/dL — ABNORMAL HIGH (ref 70–99)
Glucose-Capillary: 153 mg/dL — ABNORMAL HIGH (ref 70–99)
Glucose-Capillary: 115 mg/dL — ABNORMAL HIGH (ref 70–99)

## 2023-04-04 LAB — LEGIONELLA PNEUMOPHILA SEROGP 1 UR AG: L. pneumophila Serogp 1 Ur Ag: NEGATIVE

## 2023-04-04 MED ORDER — POTASSIUM CHLORIDE CRYS ER 20 MEQ PO TBCR
20.0000 meq | EXTENDED_RELEASE_TABLET | Freq: Once | ORAL | Status: AC
  Administered 2023-04-04: 20 meq via ORAL
  Filled 2023-04-04: qty 1

## 2023-04-04 NOTE — Telephone Encounter (Signed)
Please see message below
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
## 2023-04-04 NOTE — Progress Notes (Signed)
Patient ID: Troy [name], male   DOB: 1960/08/29, 62 y.o.   MRN: [id number] Patient is 3 weeks status post right transtibial amputation.  The wound edges are well-approximated there is no cellulitis odor or drainage no signs of infection.  Patient is healing slowly and we will leave the staples in for 1 more week.  He can continue with Dial soap cleansing and wear the stump shrinker.  I will follow-up in the office in 1 week to remove the staples.

## 2023-04-04 NOTE — Plan of Care (Signed)
  Problem: Education: Goal: Knowledge of the prescribed therapeutic regimen will improve Outcome: Progressing   Problem: Education: Goal: Ability to verbalize activity precautions or restrictions will improve Outcome: Progressing

## 2023-04-04 NOTE — TOC Initial Note (Signed)
Transition of Care [facility]) - Initial/Assessment Note    Patient Details  Name: Troy [name] MRN: [id number] Date of Birth: [birth year]  Transition of Care [facility]) CM/SW Contact:    Tannen Vandezande A [place], Theresia Majors Phone Number: 04/04/2023, 1:14 PM  Clinical Narrative:                  CSW met with pt and pt's spouse [name] at bedside. Pt arrived to hospital from [facility] for short term. She stated she spoke with the facility today and that pt can return. CSW would need to re-authorize return back to facility. Authorization will be started once closer to medical stability.   TOC will continue to follow.   Expected Discharge Plan: Skilled Nursing Facility Barriers to Discharge: Continued Medical Work up   Patient Goals and CMS Choice Patient states their goals for this hospitalization and ongoing recovery are:: Go back to Rehab          Expected Discharge Plan and Services       Living arrangements for the past 2 months: Skilled Nursing Facility, Single Family Home                                      Prior Living Arrangements/Services Living arrangements for the past 2 months: Skilled Nursing Facility, Single Family Home Lives with:: Spouse                   Activities of Daily Living   ADL Screening (condition at time of admission) Independently performs ADLs?: No Does the patient have a NEW difficulty with bathing/dressing/toileting/self-feeding that is expected to last >3 days?: No Does the patient have a NEW difficulty with getting in/out of bed, walking, or climbing stairs that is expected to last >3 days?: Yes (Initiates electronic notice to provider for possible PT consult) Does the patient have a NEW difficulty with communication that is expected to last >3 days?: No Is the patient deaf or have difficulty hearing?: No Does the patient have difficulty seeing, even when wearing glasses/contacts?: No Does the patient have difficulty  concentrating, remembering, or making decisions?: No  Permission Sought/Granted                  Emotional Assessment Appearance:: Appears stated age Attitude/Demeanor/Rapport: Lethargic Affect (typically observed): Quiet Orientation: : Oriented to Self, Oriented to Place, Oriented to Situation Alcohol / Substance Use: Tobacco Use Psych Involvement: No (comment)  Admission diagnosis:  (HFpEF) heart failure with preserved ejection fraction (HCC) [I50.30] Congestive heart failure, unspecified HF chronicity, unspecified heart failure type [facility]) [I50.9] Patient Active Problem List   Diagnosis Date Noted   (HFpEF) heart failure with preserved ejection fraction (HCC) 04/02/2023   Lactic acidosis 04/02/2023   Acute respiratory failure with hypoxia (HCC) 04/02/2023   Leukocytosis 04/02/2023   Uncontrolled type 2 diabetes mellitus with hyperglycemia, with long-term current use of insulin (HCC) 04/02/2023   Hypochromic anemia 04/02/2023   PVD (peripheral vascular disease) (HCC) 04/02/2023   Thrombocytosis 04/02/2023   Respiratory distress 03/21/2023   Osteomyelitis of ankle or foot, acute, right (HCC) 03/11/2023   Abscess of right lower leg 03/11/2023   Below-knee amputation of right lower extremity (HCC) 03/11/2023   Chronic respiratory failure with hypoxia (HCC) 02/12/2023   Pulmonary infiltrates 02/12/2023   Acute on chronic heart failure with preserved ejection fraction (HFpEF) (HCC) 02/12/2023   Chronic osteomyelitis of  right foot with draining sinus (HCC) 02/07/2023   Paroxysmal atrial flutter (HCC) 02/05/2023   Hypotension 02/05/2023   Atrial fibrillation with RVR (HCC) 02/04/2023   Atrial flutter (HCC) 02/04/2023   Lower limb ischemia: ??? 08/22/2021   Diabetic acidosis without coma (HCC)    Hypokalemia    Hypomagnesemia    Hyponatremia    Multifocal pneumonia    Parapneumonic effusion 08/18/2021   Bowel Ileus (HCC) 08/18/2021   ?? 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    Ketoacidosis due to type 2 diabetes mellitus (HCC) 08/15/2021   COVID-19 virus infection 08/15/2021   S/P AKA (above knee amputation), left (HCC) 02/12/2021   Amputation of right great toe and 2nd Toe 02/12/2021   Sepsis due to left-sided neck cellulitis/MRSA 02/12/2021   Hypotensive episode    Elevated troponin    SIRS (systemic inflammatory response syndrome) (HCC) 02/11/2021   Abscess of bursa of left ankle 01/21/2021   Dehiscence of amputation stump (HCC)    Ischemia of left BKA site (HCC)    Abscess of leg without foot, left    Osteomyelitis of second toe of right foot (HCC) 07/12/2019   Below knee amputation status, left 01/25/2018   Wound dehiscence, surgical    Status post left foot surgery 12/15/2017   GERD (gastroesophageal reflux disease) 09/26/2017   Hyperlipidemia associated with type 2 diabetes mellitus (HCC) 07/08/2017   Snoring 06/07/2016   Chest tube in place 01/22/2016   Abnormal nuclear stress test 01/22/2016   Pain, chronic due to trauma 07/04/2012   Complex regional pain syndrome I of lower limb 07/04/2012   COPD (chronic obstructive pulmonary disease) (HCC) 01/27/2011   Pre-operative cardiovascular examination 01/27/2011   Nonspecific abnormal electrocardiogram (ECG) (EKG) 01/27/2011   Murmur 01/27/2011   DM2 (diabetes mellitus, type 2) (HCC)    HTN (hypertension)    HLD (hyperlipidemia)    Tobacco abuse    PCP:  Rebecka Apley, NP Pharmacy:   [facility] [phone] - [place], [place] - [address]  [place] [address] Phone: [phone] Fax: [phone]     Social Determinants of Health (SDOH) Social History: SDOH Screenings   Food Insecurity: No Food Insecurity (04/02/2023)  Housing: Low Risk  (04/02/2023)  Transportation Needs: No Transportation Needs (04/02/2023)  Utilities: Not At Risk (04/02/2023)  Financial Resource Strain: Low Risk  (04/09/2021)   Received from [facility]  Physical  Activity: Insufficiently Active (04/09/2021)   Received from [facility], [facility]  Social Connections: Unknown (10/22/2021)   Received from [facility], [facility]  Stress: No Stress Concern Present (04/09/2021)   Received from [facility], [facility]  Tobacco Use: High Risk (04/02/2023)   SDOH Interventions:     Readmission Risk Interventions    08/25/2021   12:46 PM  Readmission Risk Prevention Plan  Transportation Screening Complete  Medication Review (RN Care Manager) Complete  PCP or Specialist appointment within 3-5 days of discharge Complete  HRI or Home Care Consult Complete  SW Recovery Care/Counseling Consult Complete  Palliative Care Screening Not Applicable  Skilled Nursing Facility Complete

## 2023-04-04 NOTE — Plan of Care (Signed)
Problem: Education: Goal: Knowledge of the prescribed therapeutic regimen will improve Outcome: Progressing Goal: Ability to verbalize activity precautions or restrictions will improve Outcome: Progressing Goal: Understanding of discharge needs will improve Outcome: Progressing   Problem: Activity: Goal: Ability to perform//tolerate increased activity and mobilize with assistive devices will improve Outcome: Progressing   Problem: Clinical Measurements: Goal: Postoperative complications will be avoided or minimized Outcome: Progressing   Problem: Self-Care: Goal: Ability to meet self-care needs will improve Outcome: Progressing   Problem: Self-Concept: Goal: Ability to maintain and perform role responsibilities to the fullest extent possible will improve Outcome: Progressing   Problem: Pain Management: Goal: Pain level will decrease with appropriate interventions Outcome: Progressing   Problem: Skin Integrity: Goal: Demonstration of wound healing without infection will improve Outcome: Progressing   Problem: Education: Goal: Understanding of CV disease, CV risk reduction, and recovery process will improve Outcome: Progressing Goal: Individualized Educational Video(s) Outcome: Progressing   Problem: Activity: Goal: Ability to return to baseline activity level will improve Outcome: Progressing   Problem: Cardiovascular: Goal: Ability to achieve and maintain adequate cardiovascular perfusion will improve Outcome: Progressing Goal: Vascular access site(s) Level 0-1 will be maintained Outcome: Progressing   Problem: Health Behavior/Discharge Planning: Goal: Ability to safely manage health-related needs after discharge will improve Outcome: Progressing   Problem: Education: Goal: Ability to demonstrate management of disease process will improve Outcome: Progressing Goal: Ability to verbalize understanding of medication therapies will improve Outcome:  Progressing Goal: Individualized Educational Video(s) Outcome: Progressing   Problem: Activity: Goal: Capacity to carry out activities will improve Outcome: Progressing   Problem: Cardiac: Goal: Ability to achieve and maintain adequate cardiopulmonary perfusion will improve Outcome: Progressing   Problem: Education: Goal: Ability to describe self-care measures that may prevent or decrease complications (Diabetes Survival Skills Education) will improve Outcome: Progressing Goal: Individualized Educational Video(s) Outcome: Progressing   Problem: Coping: Goal: Ability to adjust to condition or change in health will improve Outcome: Progressing   Problem: Fluid Volume: Goal: Ability to maintain a balanced intake and output will improve Outcome: Progressing   Problem: Health Behavior/Discharge Planning: Goal: Ability to identify and utilize available resources and services will improve Outcome: Progressing Goal: Ability to manage health-related needs will improve Outcome: Progressing   Problem: Metabolic: Goal: Ability to maintain appropriate glucose levels will improve Outcome: Progressing   Problem: Nutritional: Goal: Maintenance of adequate nutrition will improve Outcome: Progressing Goal: Progress toward achieving an optimal weight will improve Outcome: Progressing   Problem: Skin Integrity: Goal: Risk for impaired skin integrity will decrease Outcome: Progressing   Problem: Tissue Perfusion: Goal: Adequacy of tissue perfusion will improve Outcome: Progressing   Problem: Education: Goal: Knowledge of General Education information will improve Description: Including pain rating scale, medication(s)/side effects and non-pharmacologic comfort measures Outcome: Progressing   Problem: Health Behavior/Discharge Planning: Goal: Ability to manage health-related needs will improve Outcome: Progressing   Problem: Clinical Measurements: Goal: Ability to maintain  clinical measurements within normal limits will improve Outcome: Progressing Goal: Will remain free from infection Outcome: Progressing Goal: Diagnostic test results will improve Outcome: Progressing Goal: Respiratory complications will improve Outcome: Progressing Goal: Cardiovascular complication will be avoided Outcome: Progressing   Problem: Activity: Goal: Risk for activity intolerance will decrease Outcome: Progressing   Problem: Nutrition: Goal: Adequate nutrition will be maintained Outcome: Progressing   Problem: Coping: Goal: Level of anxiety will decrease Outcome: Progressing   Problem: Elimination: Goal: Will not experience complications related to bowel motility Outcome: Progressing Goal: Will not experience complications  related to urinary retention Outcome: Progressing   Problem: Pain Managment: Goal: General experience of comfort will improve Outcome: Progressing   Problem: Safety: Goal: Ability to remain free from injury will improve Outcome: Progressing

## 2023-04-04 NOTE — Consult Note (Addendum)
WOC Nurse Consult Note: Reason for Consult: Consult requested for right stump.  Pt had BKA surgery performed by Dr Lajoyce Corners of the ortho service on 9/20 and was due to be seen in the office today as an outpatient, but was admitted. Right stump with staples intact and well-approximated, no odor, drainage, fluctuance, or open wounds. Secure chat message sent to Dr Lajoyce Corners to inform him the patient is in the hospital.  Topical treatment orders provided for bedside nurses to perform as follows: Apply folded 4X4 and kerlex and ace wrap to right stump Q day, then cover with stump sock. Please re-consult if further assistance is needed.  Thank-you,  Cammie Mcgee MSN, RN, CWOCN, [place], CNS [phone]

## 2023-04-04 NOTE — Progress Notes (Signed)
Heart Failure Navigator Progress Note  Assessed for Heart & Vascular TOC clinic readiness.  Patient does not meet criteria due to [facility] appointment on 04/08/23.   Navigator will sign off at this time.  Roxy Horseman, RN, BSN [facility] Heart Failure Navigator Secure Chat Only

## 2023-04-04 NOTE — Telephone Encounter (Signed)
Pt's wife [name] called stating pt is in hospital. Asking if Dr Lajoyce Corners or PA Denny Peon can see pt at [facility]. [name] phone number is [phone]

## 2023-04-05 DIAGNOSIS — I5033 Acute on chronic diastolic (congestive) heart failure: Secondary | ICD-10-CM | POA: Diagnosis not present

## 2023-04-05 LAB — BASIC METABOLIC PANEL
Anion gap: 9 (ref 5–15)
BUN: 22 mg/dL (ref 8–23)
CO2: 36 mmol/L — ABNORMAL HIGH (ref 22–32)
Calcium: 8.9 mg/dL (ref 8.9–10.3)
Chloride: 97 mmol/L — ABNORMAL LOW (ref 98–111)
Creatinine, Ser: 0.8 mg/dL (ref 0.61–1.24)
GFR, Estimated: >60 mL/min (ref >60)
Glucose, Bld: 100 mg/dL — ABNORMAL HIGH (ref 70–99)
Potassium: 4.1 mmol/L (ref 3.5–5.1)
Sodium: 142 mmol/L (ref 135–145)

## 2023-04-05 LAB — GLUCOSE, CAPILLARY
Glucose-Capillary: 92 mg/dL (ref 70–99)
Glucose-Capillary: 155 mg/dL — ABNORMAL HIGH (ref 70–99)
Glucose-Capillary: 126 mg/dL — ABNORMAL HIGH (ref 70–99)
Glucose-Capillary: 164 mg/dL — ABNORMAL HIGH (ref 70–99)

## 2023-04-05 MED ORDER — AMIODARONE HCL 200 MG PO TABS: 200.0000 mg | ORAL_TABLET | Freq: Every day | ORAL | Status: DC

## 2023-04-05 MED ORDER — FUROSEMIDE 40 MG PO TABS
40.0000 mg | ORAL_TABLET | Freq: Once | ORAL | Status: AC
  Administered 2023-04-05: 40 mg via ORAL
  Filled 2023-04-05: qty 1

## 2023-04-05 MED ORDER — FUROSEMIDE 40 MG PO TABS: 40.0000 mg | ORAL_TABLET | Freq: Every day | ORAL | Status: AC

## 2023-04-05 MED ORDER — FUROSEMIDE 40 MG PO TABS
40.0000 mg | ORAL_TABLET | Freq: Every day | ORAL | Status: DC
  Administered 2023-04-06 – 2023-04-08 (×3): 40 mg via ORAL
  Filled 2023-04-05 (×3): qty 1

## 2023-04-05 MED ORDER — METOPROLOL SUCCINATE ER 25 MG PO TB24: 12.5000 mg | ORAL_TABLET | Freq: Every day | ORAL | Status: DC

## 2023-04-05 MED ORDER — OXYCODONE-ACETAMINOPHEN 10-325 MG PO TABS: 1.0000 | ORAL_TABLET | ORAL | 0 refills | Status: DC | PRN

## 2023-04-05 MED ORDER — METOPROLOL SUCCINATE ER 25 MG PO TB24
12.5000 mg | ORAL_TABLET | Freq: Every day | ORAL | Status: DC
  Administered 2023-04-07: 12.5 mg via ORAL
  Filled 2023-04-05 (×2): qty 1
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
## 2023-04-05 NOTE — Plan of Care (Signed)
Problem: Education: Goal: Knowledge of the prescribed therapeutic regimen will improve Outcome: Progressing Goal: Ability to verbalize activity precautions or restrictions will improve Outcome: Progressing Goal: Understanding of discharge needs will improve Outcome: Progressing

## 2023-04-05 NOTE — TOC Progression Note (Addendum)
Transition of Care [facility]) - Progression Note    Patient Details  Name: Troy [name] MRN: [id number] Date of Birth: 1960-12-01  Transition of Care [facility]) CM/SW Contact  Dellie Burns [place], [place] Phone Number: 04/05/2023, 11:28 AM  Clinical Narrative:   per MD, pt medically stable for dc back to [facility]. Home and Community/[employer] portal for auth request is currently down. CMA to start auth as soon as portal available. MD aware of barrier to dc. Confirmed with Lawerance Cruel at [facility] they are prepared to admit pt pending auth. Will provide updates as available.   UPDATE 1530: auth request for [facility] has been submitted, ref #[id number]. Will provide updates as available.     Dellie Burns, MSW, LCSW [phone] (coverage)     Expected Discharge Plan: Skilled Nursing Facility Barriers to Discharge: Continued Medical Work up  Expected Discharge Plan and Services       Living arrangements for the past 2 months: Skilled Nursing Facility, Single Family Home                                       Social Determinants of Health (SDOH) Interventions SDOH Screenings   Food Insecurity: No Food Insecurity (04/02/2023)  Housing: Low Risk  (04/02/2023)  Transportation Needs: No Transportation Needs (04/02/2023)  Utilities: Not At Risk (04/02/2023)  Financial Resource Strain: Low Risk  (04/09/2021)   Received from [facility], [facility]  Physical Activity: Insufficiently Active (04/09/2021)   Received from [facility], [facility]  Social Connections: Unknown (10/22/2021)   Received from [facility], [facility]  Stress: No Stress Concern Present (04/09/2021)   Received from [facility], [facility]  Tobacco Use: High Risk (04/02/2023)    Readmission Risk Interventions    08/25/2021   12:46 PM  Readmission Risk Prevention Plan  Transportation Screening Complete  Medication Review (RN Care Manager) Complete  PCP or Specialist appointment within  3-5 days of discharge Complete  HRI or Home Care Consult Complete  SW Recovery Care/Counseling Consult Complete  Palliative Care Screening Not Applicable  Skilled Nursing Facility Complete

## 2023-04-05 NOTE — Progress Notes (Signed)
Physical Therapy Treatment Patient Details Name: Troy [name] MRN: [id number] DOB: Oct 23, 1960 Today's Date: 04/05/2023   History of Present Illness Pt is a 62 yo presenting from nursing facility with shortness of breathe. Pt diagnosed with Heart failure with preserved ejection fraction. 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 S/p TEE and cardioversion for aflutter on 9/26. PMH: CHF, COPD, DM2, HTN, restless leg syndrome, PTSD, RLE trans met amputation, L AKA    PT Comments  Pt and wife in room upon PT arrival, pt agreeable to OOB mobility. Pt tolerated transfer to recliner well, benefits from practicing AP transfer to improve pt independence with mobilizing OOB. Pt also tolerated RLE BKA exercises well, does report some RLE pain during session. Plan remains appropriate, PT to continue to follow.      If plan is discharge home, recommend the following: A little help with walking and/or transfers;Assistance with cooking/housework   Can travel by private vehicle        Equipment Recommendations  None recommended by PT    Recommendations for Other Services       Precautions / Restrictions Precautions Precautions: Fall Precaution Comments: R BKA, L AKA Other Brace: shrinker sock Restrictions Weight Bearing Restrictions: No     Mobility  Bed Mobility Overal bed mobility: Needs Assistance Bed Mobility: Supine to Sit     Supine to sit: Supervision          Transfers Overall transfer level: Needs assistance   Transfers: Bed to chair/wheelchair/BSC         Anterior-Posterior transfers: Supervision   General transfer comment: for safety, cues for sequencing and PT holding recliner steady    Ambulation/Gait               General Gait Details: unable at this time.   Stairs             Wheelchair Mobility     Tilt Bed    Modified Rankin (Stroke Patients Only)       Balance  Overall balance assessment: Needs assistance Sitting-balance support: Bilateral upper extremity supported Sitting balance-Leahy Scale: Fair                                      Cognition Arousal: Alert Behavior During Therapy: Flat affect Overall Cognitive Status: History of cognitive impairments - at baseline                                 General Comments: WFL for session, limited verbalization        Exercises Amputee Exercises Hip Extension: AROM, Right, 10 reps, Seated Hip ABduction/ADduction: 10 reps, Strengthening, Seated, Right Knee Flexion: Strengthening, Right, 10 reps, Seated, AROM (5 sec hold)    General Comments        Pertinent Vitals/Pain Pain Assessment Pain Assessment: Faces Faces Pain Scale: Hurts little more Pain Location: R LE Pain Descriptors / Indicators: Sharp, Grimacing, Guarding Pain Intervention(s): Limited activity within patient's tolerance, Monitored during session, Repositioned    Home Living                          Prior Function            PT Goals (current goals can now be found in the  care plan section) Acute Rehab PT Goals Patient Stated Goal: Return home with spouse PT Goal Formulation: With patient Time For Goal Achievement: 04/17/23 Potential to Achieve Goals: Good Progress towards PT goals: Progressing toward goals    Frequency    Min 1X/week      PT Plan      Co-evaluation              AM-PAC PT "6 Clicks" Mobility   Outcome Measure  Help needed turning from your back to your side while in a flat bed without using bedrails?: A Little Help needed moving from lying on your back to sitting on the side of a flat bed without using bedrails?: A Little Help needed moving to and from a bed to a chair (including a wheelchair)?: A Little Help needed standing up from a chair using your arms (e.g., wheelchair or bedside chair)?: A Little Help needed to walk in hospital room?:  Total Help needed climbing 3-5 steps with a railing? : Total 6 Click Score: 14    End of Session Equipment Utilized During Treatment: Oxygen (2LO2) Activity Tolerance: Patient tolerated treatment well;Patient limited by fatigue Patient left: in chair;with call bell/phone within reach;with chair alarm set Nurse Communication: Mobility status (AP transfer for back to bed) PT Visit Diagnosis: Other abnormalities of gait and mobility (R26.89);Muscle weakness (generalized) (M62.81)     Time: 1215-1228 PT Time Calculation (min) (ACUTE ONLY): 13 min  Charges:    $Therapeutic Activity: 8-22 mins PT General Charges $$ ACUTE PT VISIT: 1 Visit                     Marye Round, PT DPT Acute Rehabilitation Services Secure Chat Preferred  Office [phone]    Silvia Hightower E Stroup 04/05/2023, 2:15 PM

## 2023-04-05 NOTE — Care Management Important Message (Signed)
Important Message  Patient Details  Name: Troy [name] MRN: [id number] Date of Birth: 04-30-1961   Important Message Given:  Yes - Medicare IM     Dorena Bodo 04/05/2023, 2:36 PM

## 2023-04-05 NOTE — Plan of Care (Signed)
Pt is alert and oriented x 4. Oxy/perocet given for pain x 2 so far this shift. Vitals stable. Shrinker and dsg intact to right stump. Vitals stable.  Problem: Education: Goal: Knowledge of the prescribed therapeutic regimen will improve Outcome: Progressing Goal: Ability to verbalize activity precautions or restrictions will improve Outcome: Progressing Goal: Understanding of discharge needs will improve Outcome: Progressing   Problem: Activity: Goal: Ability to perform//tolerate increased activity and mobilize with assistive devices will improve Outcome: Progressing   Problem: Clinical Measurements: Goal: Postoperative complications will be avoided or minimized Outcome: Progressing   Problem: Self-Care: Goal: Ability to meet self-care needs will improve Outcome: Progressing   Problem: Self-Concept: Goal: Ability to maintain and perform role responsibilities to the fullest extent possible will improve Outcome: Progressing   Problem: Pain Management: Goal: Pain level will decrease with appropriate interventions Outcome: Progressing   Problem: Skin Integrity: Goal: Demonstration of wound healing without infection will improve Outcome: Progressing   Problem: Education: Goal: Understanding of CV disease, CV risk reduction, and recovery process will improve Outcome: Progressing Goal: Individualized Educational Video(s) Outcome: Progressing   Problem: Activity: Goal: Ability to return to baseline activity level will improve Outcome: Progressing   Problem: Cardiovascular: Goal: Ability to achieve and maintain adequate cardiovascular perfusion will improve Outcome: Progressing Goal: Vascular access site(s) Level 0-1 will be maintained Outcome: Progressing   Problem: Health Behavior/Discharge Planning: Goal: Ability to safely manage health-related needs after discharge will improve Outcome: Progressing   Problem: Education: Goal: Ability to demonstrate management of  disease process will improve Outcome: Progressing Goal: Ability to verbalize understanding of medication therapies will improve Outcome: Progressing Goal: Individualized Educational Video(s) Outcome: Progressing   Problem: Activity: Goal: Capacity to carry out activities will improve Outcome: Progressing   Problem: Cardiac: Goal: Ability to achieve and maintain adequate cardiopulmonary perfusion will improve Outcome: Progressing   Problem: Education: Goal: Ability to describe self-care measures that may prevent or decrease complications (Diabetes Survival Skills Education) will improve Outcome: Progressing Goal: Individualized Educational Video(s) Outcome: Progressing   Problem: Coping: Goal: Ability to adjust to condition or change in health will improve Outcome: Progressing   Problem: Fluid Volume: Goal: Ability to maintain a balanced intake and output will improve Outcome: Progressing   Problem: Health Behavior/Discharge Planning: Goal: Ability to identify and utilize available resources and services will improve Outcome: Progressing Goal: Ability to manage health-related needs will improve Outcome: Progressing   Problem: Metabolic: Goal: Ability to maintain appropriate glucose levels will improve Outcome: Progressing   Problem: Nutritional: Goal: Maintenance of adequate nutrition will improve Outcome: Progressing Goal: Progress toward achieving an optimal weight will improve Outcome: Progressing   Problem: Skin Integrity: Goal: Risk for impaired skin integrity will decrease Outcome: Progressing   Problem: Tissue Perfusion: Goal: Adequacy of tissue perfusion will improve Outcome: Progressing   Problem: Education: Goal: Knowledge of General Education information will improve Description: Including pain rating scale, medication(s)/side effects and non-pharmacologic comfort measures Outcome: Progressing   Problem: Health Behavior/Discharge Planning: Goal:  Ability to manage health-related needs will improve Outcome: Progressing   Problem: Clinical Measurements: Goal: Ability to maintain clinical measurements within normal limits will improve Outcome: Progressing Goal: Will remain free from infection Outcome: Progressing Goal: Diagnostic test results will improve Outcome: Progressing Goal: Respiratory complications will improve Outcome: Progressing Goal: Cardiovascular complication will be avoided Outcome: Progressing   Problem: Activity: Goal: Risk for activity intolerance will decrease Outcome: Progressing   Problem: Nutrition: Goal: Adequate nutrition will be maintained Outcome: Progressing   Problem:  Coping: Goal: Level of anxiety will decrease Outcome: Progressing   Problem: Elimination: Goal: Will not experience complications related to bowel motility Outcome: Progressing Goal: Will not experience complications related to urinary retention Outcome: Progressing   Problem: Pain Managment: Goal: General experience of comfort will improve Outcome: Progressing   Problem: Safety: Goal: Ability to remain free from injury will improve Outcome: Progressing

## 2023-04-06 ENCOUNTER — Telehealth: Payer: Self-pay | Admitting: Family

## 2023-04-06 ENCOUNTER — Inpatient Hospital Stay (HOSPITAL_COMMUNITY): Payer: Medicare Other

## 2023-04-06 DIAGNOSIS — I5033 Acute on chronic diastolic (congestive) heart failure: Secondary | ICD-10-CM | POA: Diagnosis not present

## 2023-04-06 DIAGNOSIS — G459 Transient cerebral ischemic attack, unspecified: Secondary | ICD-10-CM

## 2023-04-06 LAB — GLUCOSE, CAPILLARY
Glucose-Capillary: 118 mg/dL — ABNORMAL HIGH (ref 70–99)
Glucose-Capillary: 190 mg/dL — ABNORMAL HIGH (ref 70–99)
Glucose-Capillary: 190 mg/dL — ABNORMAL HIGH (ref 70–99)
Glucose-Capillary: 151 mg/dL — ABNORMAL HIGH (ref 70–99)
Glucose-Capillary: 110 mg/dL — ABNORMAL HIGH (ref 70–99)

## 2023-04-06 LAB — BASIC METABOLIC PANEL
Anion gap: 10 (ref 5–15)
BUN: 27 mg/dL — ABNORMAL HIGH (ref 8–23)
CO2: 35 mmol/L — ABNORMAL HIGH (ref 22–32)
Calcium: 8.7 mg/dL — ABNORMAL LOW (ref 8.9–10.3)
Chloride: 96 mmol/L — ABNORMAL LOW (ref 98–111)
Creatinine, Ser: 0.8 mg/dL (ref 0.61–1.24)
GFR, Estimated: >60 mL/min (ref >60)
Glucose, Bld: 116 mg/dL — ABNORMAL HIGH (ref 70–99)
Potassium: 4 mmol/L (ref 3.5–5.1)
Sodium: 141 mmol/L (ref 135–145)

## 2023-04-06 MED ORDER — GLIPIZIDE 2.5 MG HALF TABLET
2.5000 mg | ORAL_TABLET | Freq: Two times a day (BID) | ORAL | Status: DC
  Filled 2023-04-06: qty 1

## 2023-04-06 MED ORDER — IOHEXOL 350 MG/ML SOLN
75.0000 mL | Freq: Once | INTRAVENOUS | Status: AC | PRN
  Administered 2023-04-06: 75 mL via INTRAVENOUS

## 2023-04-06 MED ORDER — GLIPIZIDE 5 MG PO TABS
2.5000 mg | ORAL_TABLET | Freq: Two times a day (BID) | ORAL | Status: DC
  Administered 2023-04-06 – 2023-04-08 (×4): 2.5 mg via ORAL
  Filled 2023-04-06 (×5): qty 0.5

## 2023-04-06 MED ORDER — STROKE: EARLY STAGES OF RECOVERY BOOK
Freq: Once | Status: AC
  Administered 2023-04-07: 1
  Filled 2023-04-06: qty 1

## 2023-04-06 MED ORDER — IOHEXOL 350 MG/ML SOLN: 75.0000 mL | Freq: Once | INTRAVENOUS | Status: DC | PRN

## 2023-04-06 MED ORDER — POLYETHYLENE GLYCOL 3350 17 G PO PACK
17.0000 g | PACK | Freq: Every day | ORAL | Status: DC | PRN
  Administered 2023-04-06: 17 g via ORAL
  Filled 2023-04-06 (×3): qty 1

## 2023-04-06 NOTE — Telephone Encounter (Signed)
Will hold until he is discharged.

## 2023-04-06 NOTE — Progress Notes (Signed)
PROGRESS NOTE  Troy [name]  DOB: [birth year]  PCP: Rebecka Apley, NP ZOX:[id number]  DOA: 04/02/2023  LOS: 4 days  Hospital Day: 5  Brief narrative: Troy [name] is a 62 y.o. male with PMH significant for DM2, HTN, HLD, chronic diastolic CHF, paroxysmal A-fib/flutter, PAD s/p stenting of the right femoral artery on 8/12, COPD, liver cirrhosis.  Hospitalized 8/16 - 8/28 for A-fib with RVR, improved with Cardizem drip.  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 Lajoyce Corners on 02/09/2023 9/20,  underwent surgery for dehiscence of the right transmetatarsal amputation and underwent a right below-knee amputation. He had previous left BKA from 2022. He was discharged to [facility] on 10/3 . 10/12, patient presented to the ED with complaints of shortness of breath.   Workup in the ED showed chest x-ray with pulm edema Admitted for CHF exacerbation.  His hospitalization was complicated by atrial flutter and hypotension.  He developed respiratory failure during that time thought to be secondary to pulmonary edema.  Patient was given furosemide in the ED and was hospitalized for further management.   See below for details  Subjective: Patient was seen and examined this morning. Looks older for his age.  Not on oxygen at rest but.  Per wife at bedside, he needs oxygen on ambulation. Later this morning, patient had an episode of right facial droop, right-sided flaccid, tongue protrusion out of the right side Code stroke was called.  Seen by neurology.  CT head did not show any acute intracranial hemorrhage or cortical infarct.  There were age indeterminate  infarcts in both arms.  CT angio head did not show large vessel occlusion or high-grade stenosis.  Assessment and plan.: Acute diastolic CHF Acute respiratory failure with hypoxia Patient initially required 3 L of oxygen by nasal cannula.   Recent  echocardiogram showed normal LVEF but was noted to have diastolic dysfunction. Chest x-ray on admission showed pulmonary edema.  Patient was started on IV furosemide. PTA on Lasix 40 mg daily. Respiratory status has improved.  He has diuresed well.   Subsequent chest x-ray shows improvement though still has edema and effusion.   Currently on oral Toprol 12.5 mg daily, Lasix 40 mg daily, [name] 10 mg daily  TIA Patient acute neurological symptoms this morning. He had an episode of right facial droop, right-sided flaccid, tongue protrusion out of the right side Code stroke was called.  Seen by neurology.  CT head did not show any acute intracranial hemorrhage or cortical infarct.  There were age indeterminate  infarcts in both arms.  CT angio head did not show large vessel occlusion or high-grade stenosis. TIA workup in progress. Transfer orders in to stroke unit PTA meds-Eliquis, Crestor.  Continue both   Paroxysmal atrial flutter Seen by cardiology during previous hospitalization.  Underwent cardioversion which was not successful.  Was evaluated by electrophysiology.  Started on amiodarone as well as metoprolol.  Dose reduced due to bradycardia.   Chronically anticoagulated with apixaban.    Leukocytosis Possibly reactive.  Continue to monitor Repeat WBC count and lactic acid level tomorrow Recent Labs  Lab 04/02/23 1204 04/02/23 1346 04/03/23 0325 04/04/23 0307  WBC 15.0*  --  15.0* 16.3*  LATICACIDVEN  --  2.2*  --   --   PROCALCITON <0.10  --   --   --    Type 2 diabetes mellitus uncontrolled with hyperglycemia A1c 11.7.  For PTA  meds-Tresiba 10 units daily, sliding scale statin, [name] 10 mg daily, glipizide 10 mg twice daily, Continue Semglee 10 units daily, SSI/Accu-Cheks.  Blood sugar elevated.  Resume glipizide at low-dose of 2.5 mg twice daily Recent Labs  Lab 04/05/23 1603 04/05/23 2110 04/06/23 0621 04/06/23 1045 04/06/23 1216  GLUCAP 126* 164* 118* 190*  190*   PAD s/p stent in August 2024 S/p right BKA Seen by Dr. Lajoyce Corners.  Wound thought to be clean.  Staples to be removed next week.  Finished a course of doxycycline.     Chronic normocytic anemia Hemoglobin low but stable. Continue PPI Continue monitor Recent Labs    03/22/23 0458 03/23/23 0418 04/02/23 1204 04/03/23 0325 04/04/23 0307  HGB 8.8* 9.1* 10.4* 9.8* 10.1*  MCV 83.9 86.2 80.4 80.0 79.7*   H/o COPD Continue bronchodilators  Anxiety/depression Continue sertraline    Mobility: Seen by PT.  SNF recommended  Goals of care   Code Status: Limited: Do not attempt resuscitation (DNR) -DNR-LIMITED -Do Not Intubate/DNI      DVT prophylaxis:   apixaban (ELIQUIS) tablet 5 mg   Antimicrobials: None Fluid: None Consultants: Neurology Family Communication: Wife at bedside  Status: Inpatient Level of care:  Telemetry Medical   Patient is from: [facility] Needs to continue in-hospital care: TIA workup in progress Anticipated d/c to: Back to SNF Likely in 1 to 2 Days    Diet:  Diet Order             Diet NPO time specified  Diet effective now           Diet - low sodium heart healthy                   Scheduled Meds:  [START ON 04/07/2023]  stroke: early stages of recovery book   Does not apply Once   amiodarone  200 mg Oral Daily   apixaban  5 mg Oral BID   empagliflozin  10 mg Oral QAC breakfast   furosemide  40 mg Oral Daily   glipiZIDE  2.5 mg Oral BID AC   insulin aspart  0-15 Units Subcutaneous TID WC   insulin glargine-yfgn  10 Units Subcutaneous Daily   metoprolol succinate  12.5 mg Oral Daily   nicotine  14 mg Transdermal Daily   pantoprazole  40 mg Oral Daily   rosuvastatin  40 mg Oral Daily   sertraline  50 mg Oral Daily   sodium chloride flush  3 mL Intravenous Q12H    PRN meds: acetaminophen **OR** acetaminophen, albuterol, hydrALAZINE, iohexol, ondansetron **OR** ondansetron (ZOFRAN) IV, oxyCODONE-acetaminophen **AND**  oxyCODONE   Infusions:    Antimicrobials: Anti-infectives (From admission, onward)    Start     Dose/Rate Route Frequency Ordered Stop   04/02/23 2200  doxycycline (VIBRA-TABS) tablet 100 mg        100 mg Oral 2 times daily 04/02/23 1804 04/05/23 2124       Objective: Vitals:   04/06/23 1400 04/06/23 1430  BP: (!) 129/50 (!) 135/45  Pulse: (!) 53 (!) 50  Resp: 16 16  Temp:    SpO2: 98% 95%    Intake/Output Summary (Last 24 hours) at 04/06/2023 1458 Last data filed at 04/06/2023 1330 Gross per 24 hour  Intake 1302 ml  Output 1125 ml  Net 177 ml   Filed Weights   04/04/23 0500 04/05/23 0148 04/06/23 0423  Weight: 75.2 kg 75.2 kg 79.4 kg   Weight change: 4.2 kg Body mass  index is 24.41 kg/m.   Physical Exam: Prior to code stroke General exam: Pleasant, elderly.  Not in distress Skin: No rashes, lesions or ulcers. HEENT: Atraumatic, normocephalic, no obvious bleeding Lungs: Clear to auscultation bilaterally CVS: Regular rate and rhythm, no murmur GI/Abd soft, nontender, nondistended, possible CNS: Slow to respond but alert, awake, oriented to place and person Psychiatry: Sad affect Extremities: Trace bilateral edema, no evidence  Data Review: I have personally reviewed the laboratory data and studies available.  F/u labs ordered Unresulted Labs (From admission, onward)     Start     Ordered   04/07/23 0500  Lipid panel  (Labs)  Tomorrow morning,   R       Comments: Fasting    04/06/23 1349   04/07/23 0500  CBC with Differential/Platelet  Daily,   R      04/06/23 1458   04/04/23 0500  Basic metabolic panel  Daily,   R      04/03/23 1102            Total time spent in review of labs and imaging, patient evaluation, formulation of plan, documentation and communication with family: 55 minutes  Signed, Lorin Glass, MD [facility] 04/06/2023

## 2023-04-06 NOTE — Telephone Encounter (Signed)
Called [facility] to schedule an appt for next week with Erin stable removal. [facility] advised patient is still in the hospital. Kieth Brightly [phone]
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
## 2023-04-06 NOTE — Code Documentation (Signed)
Stroke Response Nurse Documentation Code Documentation  Troy [name] is a 62 y.o. male admitted to [facility]  on 04/01/2024 for heart failure with past medical hx of COPD, HTN, HLD, ketoacidosis, lower limb ischemia, osteomyelitis, bilateral lower extremity amputation, PVD s/p R femoral stent, cirrhosis,  A Flutter on eliquis. On Eliquis (apixaban) daily. Code stroke was activated by Stroke Response RN.   Patient on 3E unit where he was LKW at 1150 and now complaining of R facial droop, R side flaccid, tongue protruding out to right side. Per RN, patients wife came out of the room to inform him patient was not communicating and right side was not moving. RN noticed patient had a right sided facial droop, tongue was protruding out to the right side, and right side of body was flaccid.   Stroke team at the bedside after patient activation. Patient to CT with team. NIHSS 1, see documentation for details and code stroke times. Patient with decreased LOC on exam. The following imaging was completed:  CT Head and CTA. Patient is not a candidate for IV Thrombolytic due to symptoms too mild to treat per MD. Patient is not a candidate for IR due to no LVO noted on imaging.   Care/Plan: VS/NIHSS q24min until out of TNK window; then q2hr with BP Goal <220/120.   Bedside handoff with RN Elliot Gurney.    Felecia Jan  Stroke Response RN

## 2023-04-07 ENCOUNTER — Inpatient Hospital Stay (HOSPITAL_COMMUNITY): Payer: Medicare Other

## 2023-04-07 ENCOUNTER — Encounter (HOSPITAL_COMMUNITY): Payer: Self-pay | Admitting: Internal Medicine

## 2023-04-07 DIAGNOSIS — I482 Chronic atrial fibrillation, unspecified: Secondary | ICD-10-CM | POA: Diagnosis not present

## 2023-04-07 DIAGNOSIS — G459 Transient cerebral ischemic attack, unspecified: Secondary | ICD-10-CM | POA: Diagnosis not present

## 2023-04-07 DIAGNOSIS — I5033 Acute on chronic diastolic (congestive) heart failure: Secondary | ICD-10-CM

## 2023-04-07 LAB — CBC WITH DIFFERENTIAL/PLATELET
Abs Immature Granulocytes: 0.04 10*3/uL (ref 0.00–0.07)
Basophils Absolute: 0.1 10*3/uL (ref 0.0–0.1)
Basophils Relative: 1 %
Eosinophils Absolute: 0.3 10*3/uL (ref 0.0–0.5)
Eosinophils Relative: 2 %
HCT: 35.3 % — ABNORMAL LOW (ref 39.0–52.0)
Hemoglobin: 10.6 g/dL — ABNORMAL LOW (ref 13.0–17.0)
Immature Granulocytes: 0 %
Lymphocytes Relative: 23 %
Lymphs Abs: 3 10*3/uL (ref 0.7–4.0)
MCH: 23.8 pg — ABNORMAL LOW (ref 26.0–34.0)
MCHC: 30 g/dL (ref 30.0–36.0)
MCV: 79.1 fL — ABNORMAL LOW (ref 80.0–100.0)
Monocytes Absolute: 1.1 10*3/uL — ABNORMAL HIGH (ref 0.1–1.0)
Monocytes Relative: 8 %
Neutro Abs: 8.5 10*3/uL — ABNORMAL HIGH (ref 1.7–7.7)
Neutrophils Relative %: 66 %
Platelets: 399 10*3/uL (ref 150–400)
RBC: 4.46 MIL/uL (ref 4.22–5.81)
RDW: 17.8 % — ABNORMAL HIGH (ref 11.5–15.5)
WBC: 13 10*3/uL — ABNORMAL HIGH (ref 4.0–10.5)
nRBC: 0 % (ref 0.0–0.2)

## 2023-04-07 LAB — ECHOCARDIOGRAM COMPLETE
AR max vel: 3.96 cm2
AV Area VTI: 3.66 cm2
AV Area mean vel: 3.94 cm2
AV Mean grad: 6 mm[Hg]
AV Peak grad: 12.5 mm[Hg]
Ao pk vel: 1.77 m/s
Area-P 1/2: 2.66 cm2
Calc EF: 80.5 %
Height: 71 in
MV VTI: 2.88 cm2
S' Lateral: 2 cm
Single Plane A2C EF: 78.9 %
Single Plane A4C EF: 80 %
Weight: 2800.72 [oz_av]

## 2023-04-07 LAB — CULTURE, BLOOD (SINGLE)
Culture: NO GROWTH
Special Requests: ADEQUATE

## 2023-04-07 LAB — LIPID PANEL
Cholesterol: 108 mg/dL (ref 0–200)
HDL: 40 mg/dL — ABNORMAL LOW (ref >40)
LDL Cholesterol: 47 mg/dL (ref 0–99)
Total CHOL/HDL Ratio: 2.7 {ratio}
Triglycerides: 104 mg/dL (ref <150)
VLDL: 21 mg/dL (ref 0–40)

## 2023-04-07 LAB — GLUCOSE, CAPILLARY
Glucose-Capillary: 129 mg/dL — ABNORMAL HIGH (ref 70–99)
Glucose-Capillary: 188 mg/dL — ABNORMAL HIGH (ref 70–99)
Glucose-Capillary: 82 mg/dL (ref 70–99)
Glucose-Capillary: 136 mg/dL — ABNORMAL HIGH (ref 70–99)

## 2023-04-07 LAB — BASIC METABOLIC PANEL
Anion gap: 12 (ref 5–15)
BUN: 23 mg/dL (ref 8–23)
CO2: 31 mmol/L (ref 22–32)
Calcium: 9.1 mg/dL (ref 8.9–10.3)
Chloride: 98 mmol/L (ref 98–111)
Creatinine, Ser: 0.74 mg/dL (ref 0.61–1.24)
GFR, Estimated: >60 mL/min (ref >60)
Glucose, Bld: 111 mg/dL — ABNORMAL HIGH (ref 70–99)
Potassium: 3.4 mmol/L — ABNORMAL LOW (ref 3.5–5.1)
Sodium: 141 mmol/L (ref 135–145)

## 2023-04-07 MED ORDER — PERFLUTREN LIPID MICROSPHERE
1.0000 mL | INTRAVENOUS | Status: AC | PRN
  Administered 2023-04-07: 2 mL via INTRAVENOUS

## 2023-04-07 NOTE — Evaluation (Signed)
Speech Language Pathology Evaluation Patient Details Name: Troy [name] MRN: [id number] DOB: Jul 13, 1960 Today's Date: 04/07/2023 Time: 6962-9528 SLP Time Calculation (min) (ACUTE ONLY): 19 min  Problem List:  Patient Active Problem List   Diagnosis Date Noted   (HFpEF) heart failure with preserved ejection fraction (HCC) 04/02/2023   Lactic acidosis 04/02/2023   Acute respiratory failure with hypoxia (HCC) 04/02/2023   Leukocytosis 04/02/2023   Uncontrolled type 2 diabetes mellitus with hyperglycemia, with long-term current use of insulin (HCC) 04/02/2023   Hypochromic anemia 04/02/2023   PVD (peripheral vascular disease) (HCC) 04/02/2023   Thrombocytosis 04/02/2023   Respiratory distress 03/21/2023   Osteomyelitis of ankle or foot, acute, right (HCC) 03/11/2023   Abscess of right lower leg 03/11/2023   Below-knee amputation of right lower extremity (HCC) 03/11/2023   Chronic respiratory failure with hypoxia (HCC) 02/12/2023   Pulmonary infiltrates 02/12/2023   Acute on chronic heart failure with preserved ejection fraction (HFpEF) (HCC) 02/12/2023   Chronic osteomyelitis of right foot with draining sinus (HCC) 02/07/2023   Paroxysmal atrial flutter (HCC) 02/05/2023   Hypotension 02/05/2023   Atrial fibrillation with RVR (HCC) 02/04/2023   Atrial flutter (HCC) 02/04/2023   Lower limb ischemia: ??? 08/22/2021   Diabetic acidosis without coma (HCC)    Hypokalemia    Hypomagnesemia    Hyponatremia    Multifocal pneumonia    Parapneumonic effusion 08/18/2021   Bowel Ileus (HCC) 08/18/2021   ?? 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   Ketoacidosis due to type 2 diabetes mellitus (HCC) 08/15/2021   COVID-19 virus infection 08/15/2021   S/P AKA (above knee amputation), left (HCC) 02/12/2021   Amputation of right great toe and 2nd Toe 02/12/2021   Sepsis due to left-sided neck cellulitis/MRSA 02/12/2021   Hypotensive episode    Elevated troponin    SIRS (systemic inflammatory  response syndrome) (HCC) 02/11/2021   Abscess of bursa of left ankle 01/21/2021   Dehiscence of amputation stump (HCC)    Ischemia of left BKA site (HCC)    Abscess of leg without foot, left    Osteomyelitis of second toe of right foot (HCC) 07/12/2019   Below knee amputation status, left 01/25/2018   Wound dehiscence, surgical    Status post left foot surgery 12/15/2017   GERD (gastroesophageal reflux disease) 09/26/2017   Hyperlipidemia associated with type 2 diabetes mellitus (HCC) 07/08/2017   Snoring 06/07/2016   Chest tube in place 01/22/2016   Abnormal nuclear stress test 01/22/2016   Pain, chronic due to trauma 07/04/2012   Complex regional pain syndrome I of lower limb 07/04/2012   COPD (chronic obstructive pulmonary disease) (HCC) 01/27/2011   Pre-operative cardiovascular examination 01/27/2011   Nonspecific abnormal electrocardiogram (ECG) (EKG) 01/27/2011   Murmur 01/27/2011   DM2 (diabetes mellitus, type 2) (HCC)    HTN (hypertension)    HLD (hyperlipidemia)    Tobacco abuse    Past Medical History:  Past Medical History:  Diagnosis Date   Acute renal failure (HCC)    in setting of NSAID use and orthopedic surgery 2010   Anxiety and depression    Chronic diastolic CHF (congestive heart failure) (HCC)    a. Echo 6/17: severe conc LVH, vigorous EF, EF 65-70%, no dynamic obstruction, no RWMA, Gr 1 DD, mild TR  //  b. LHC 8/17: no sig CAD, LVEDP 28   Cirrhosis of liver (HCC)    COPD (chronic obstructive pulmonary disease) (HCC)    Diabetic ulcer of left foot (HCC)  DM2 (diabetes mellitus, type 2) (HCC)    Dysrhythmia    Family history of early CAD    GERD (gastroesophageal reflux disease)    History of amputation of foot (HCC)    L trans-met // R toe   History of cardiac catheterization    a. LHC 2002: irregs  //  b. LHC in 8/17: no sig CAD, apical DK, hyperdynamic LV, LVEDP 28   History of kidney stones    History of nuclear stress test    a. Nuc 7/17:  Overall, intermediate risk nuclear stress test secondary to small size of apical lateral defect and reduced ejection fraction.  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    Kidney calculi    Palpitations    Pneumonia    PTSD (post-traumatic stress disorder)    Tobacco abuse    Past Surgical History:  Past Surgical History:  Procedure Laterality Date   ABDOMINAL AORTOGRAM W/LOWER EXTREMITY N/A 01/31/2023   Procedure: ABDOMINAL AORTOGRAM W/LOWER EXTREMITY;  Surgeon: Maeola Harman, MD;  Location: [facility] INVASIVE CV LAB;  Service: Cardiovascular;  Laterality: N/A;   AMPUTATION Left 01/03/2018   Procedure: LEFT MIDFOOT AMPUTATION/REVISION MIDAMPUTAION;  Surgeon: Kathryne Hitch, MD;  Location: [facility] OR;  Service: Orthopedics;  Laterality: Left;   AMPUTATION Left 01/25/2018   Procedure: LEFT BELOW KNEE AMPUTATION;  Surgeon: Nadara Mustard, MD;  Location: [facility] OR;  Service: Orthopedics;  Laterality: Left;   AMPUTATION Left 01/21/2021   Procedure: LEFT ABOVE KNEE AMPUTATION;  Surgeon: Nadara Mustard, MD;  Location: [facility] OR;  Service: Orthopedics;  Laterality: Left;   AMPUTATION Right 02/09/2023   Procedure: RIGHT TRANSMETATARSAL AMPUTATION;  Surgeon: Nadara Mustard, MD;  Location: [facility] OR;  Service: Orthopedics;  Laterality: Right;   AMPUTATION Right 03/11/2023   Procedure: RIGHT BELOW KNEE AMPUTATION;  Surgeon: Nadara Mustard, MD;  Location: [facility] OR;  Service: Orthopedics;  Laterality: Right;   AMPUTATION TOE Right 07/17/2019   Procedure: AMPUTATION RIGHT FOOT 2ND TOE;  Surgeon: Kathryne Hitch, MD;  Location: [facility];  Service: Orthopedics;  Laterality: Right;   APPLICATION OF WOUND VAC Left 01/21/2021   Procedure: APPLICATION OF WOUND VAC;  Surgeon: Nadara Mustard, MD;  Location: [facility] OR;  Service: Orthopedics;  Laterality: Left;   BELOW KNEE LEG AMPUTATION Left  01/25/2018   CARDIAC CATHETERIZATION N/A 01/22/2016   Procedure: Left Heart Cath and Coronary Angiography;  Surgeon: Peter M Swaziland, MD;  Location: [facility] INVASIVE CV LAB;  Service: Cardiovascular;  Laterality: N/A;   CARDIOVERSION N/A 03/17/2023   Procedure: CARDIOVERSION;  Surgeon: Chrystie Nose, MD;  Location: [facility] INVASIVE CV LAB;  Service: Cardiovascular;  Laterality: N/A;   FOOT AMPUTATION Bilateral    I & D EXTREMITY Left 12/15/2017   Procedure: IRRIGATION AND DEBRIDEMENT LEFT FOOT ULCER;  Surgeon: Kathryne Hitch, MD;  Location: [facility] ORS;  Service: Orthopedics;  Laterality: Left;   I & D EXTREMITY Left 07/25/2020   Procedure: LEFT BELOW KNEE AMPUTATION ABSCESS EXCISION AND SKIN GRAFT;  Surgeon: Nadara Mustard, MD;  Location: [facility] OR;  Service: Orthopedics;  Laterality: Left;   I & D EXTREMITY Left 08/22/2020   Procedure: DEBRIDEMENT LEFT BELOW KNEE AMPUTATION AND APPLY KERECIS SKIN GRAFT;  Surgeon: Nadara Mustard, MD;  Location: [facility] OR;  Service: Orthopedics;  Laterality: Left;   LITHOTRIPSY     PERIPHERAL VASCULAR INTERVENTION  01/31/2023   Procedure: PERIPHERAL VASCULAR INTERVENTION;  Surgeon: Maeola Harman, MD;  Location: [facility] INVASIVE CV LAB;  Service: Cardiovascular;;   TEE WITHOUT CARDIOVERSION N/A 03/17/2023   Procedure: TRANSESOPHAGEAL ECHOCARDIOGRAM;  Surgeon: Chrystie Nose, MD;  Location: [facility] INVASIVE CV LAB;  Service: Cardiovascular;  Laterality: N/A;   TENDON LENGTHENING Bilateral    calf   TONSILLECTOMY     HPI:  WESTON WHITTAKER is a 62 y.o. male with medical history significant of heart failure with preserved ejection fraction, paroxysmal atrial flutter, peripheral vascular disease s/p stenting of the right femoral artery on 8/12, COPD, cirrhosis who presented on 04/02/23 with complaints of shortness of breath.  Patient recently hospitalized from 9/20-10/3.  He underwent plain right BKA by Dr. Lajoyce Corners after being found to have concern for infection with dehiscence of his  prior right transmetatarsal amputation.  Following the procedure patient was found to be in atrial flutter and had been taken to the ICU temporarily due to hypotension after receiving Cardizem.  He was noted to have acute hypoxic respiratory failure thought possibly secondary to pneumonia versus pulmonary edema versus amiodarone induced lung toxicity.  He was treated with antibiotics initially, but procalcitonin was less than 0.1 for which it was thought to be a noninfectious cause.  He was given Lasix IV for 3 days and placed on a steroid taper with improvement.  He had been sent to [facility] rehab on oxygen 2 L of oxygen.  At the facility they had increased his oxygen to 5 L. ST consulted for speech/language cognitive assessment.   Assessment / Plan / Recommendation Clinical Impression  Pt administered the [employer] [employer] Mental Status Examination (SLUMS) with a score obtained of 17/30 with a typical score on this assessment being 27/30.  Deficits included decreased recall of new information/retrieval (40%) with time constraint implemented.  Pt named objects in a category, but perseveration noted during task and cueing provided to continue task as pt would stop naming category items without encouragement.  Decreased sustained attention impacted digit reversal recall, but pt was able to complete simple calculation independently.  Paragraph retention/recall yielded 50% accuracy, but wife stated pt "had declined with understanding/motivation over the last several years."  Pt has been on disability since 2010 per report.  Pt with noted flat affect/using sarcasm during answering questions and avoiding certain tasks, so overall score may be skewed.  May consider referral with counselor d/t decreased motivation/appearance of potential depression per spouse's report.  Spatial/thought organization decreased with clock formation as numbers were not in correct space, but clock hands accurate.  Pt with noted awareness  of this error stating "I didn't do that right."  ST will f/u briefly for cognitive reorganization during acute stay. .ST may f/u at next level of care to assess cognitive function further/address above mentioned deficits if pt/family are in agreement. Thank you for this consult.    SLP Assessment  SLP Recommendation/Assessment: Patient needs continued Speech Language Pathology Services SLP Visit Diagnosis: Cognitive communication deficit (R41.841);Attention and concentration deficit Attention and concentration deficit following: Other cerebrovascular disease    Recommendations for follow up therapy are one component of a multi-disciplinary discharge planning process, led by the attending physician.  Recommendations may be updated based on patient status, additional functional criteria and insurance authorization.    Follow Up Recommendations  Follow physician's recommendations for discharge plan and follow up therapies    Assistance Recommended  at Discharge  Frequent or constant Supervision/Assistance  Functional Status Assessment Patient has had a recent decline in their functional status and demonstrates the ability to make significant improvements in function in a reasonable and predictable amount of time.  Frequency and Duration min 1 x/week  1 week      SLP Evaluation Cognition  Overall Cognitive Status: History of cognitive impairments - at baseline Arousal/Alertness: Awake/alert Orientation Level: Oriented to person;Oriented to place;Disoriented to time;Disoriented to situation Day of Week: Incorrect Attention: Sustained Sustained Attention: Impaired Sustained Attention Impairment: Verbal basic;Functional basic Memory: Impaired Memory Impairment: Decreased recall of new information;Decreased short term memory;Retrieval deficit Decreased Short Term Memory: Verbal basic;Functional basic Awareness: Impaired Awareness Impairment: Intellectual impairment Behaviors:  Perseveration Comments: DTA as pt uses sarcasm intermittently to potentially cover deficits; pain may be a factor related to attention (7/10) as internal distraction       Comprehension  Auditory Comprehension Overall Auditory Comprehension: Impaired at baseline Yes/No Questions: Within Functional Limits Commands: Not tested Conversation: Simple Interfering Components: Pain;Working Radio broadcast assistant: Repetition Counsellor: Within [employer] Reading Comprehension Reading Status: Within funtional limits (for environmental signs only)    Expression Expression Primary Mode of Expression: Verbal Verbal Expression Overall Verbal Expression: Other (comment) (DTA d/t pt participation) Level of Generative/Spontaneous Verbalization: Conversation Naming: Not tested Interfering Components: Other (comment) (pain, motivation; flat affect vs potential depression) Non-Verbal Means of Communication: Not applicable Written Expression Dominant Hand: Right Written Expression: Exceptions to [facility] Interfering Components: Attention;Spatial organization;Thought organization   Oral / Motor  Oral Motor/Sensory Function Overall Oral Motor/Sensory Function: Within functional limits Motor Speech Overall Motor Speech: Appears within functional limits for tasks assessed Respiration: Within functional limits Phonation: Normal Resonance: Within functional limits Articulation: Within functional limitis Intelligibility: Intelligible Motor Planning: Witnin functional limits Motor Speech Errors: Not applicable            Pat Daneshia Tavano,M.S.,CCC-SLP 04/07/2023, 11:09 AM

## 2023-04-07 NOTE — Progress Notes (Addendum)
Physical Therapy Treatment Patient Details Name: Troy [name] MRN: [id number] DOB: Sep 17, 1960 Today's Date: 04/07/2023   History of Present Illness Pt is a 62 yo presenting from nursing facility with shortness of breathe, workup for HF. Code stroke called 10/16 for R facial droop, speech difficulty, R weakness. CTH and CTA negative for acute findings. 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 S/p TEE and cardioversion for aflutter on 9/26. PMH: CHF, COPD, DM2, HTN, restless leg syndrome, PTSD, RLE trans met amputation, L AKA    PT Comments  Pt reports no residual deficits from code stroke on 10/16, RLE and UE strength deficits resolved at time of session and no facial droop appreciated. Pt overall requiring light assist to perform multiple transfers during session, pt limited by fatigue and limited activity tolerance at this time. Pt propelled self in w/c in hallway, again limited by tolerance and requires cues for form/safety. PT feels pt would benefit from post-acute rehab to improve tolerance and strength. PT to continue to follow.      If plan is discharge home, recommend the following: A little help with walking and/or transfers;A little help with bathing/dressing/bathroom   Can travel by private vehicle        Equipment Recommendations  None recommended by PT    Recommendations for Other Services       Precautions / Restrictions Precautions Precautions: Fall Precaution Comments: R BKA, L AKA Other Brace: shrinker sock Restrictions Weight Bearing Restrictions: No     Mobility  Bed Mobility Overal bed mobility: Needs Assistance Bed Mobility: Supine to Sit, Sit to Supine     Supine to sit: Supervision Sit to supine: Min assist   General bed mobility comments: for safety, cues for sequencing. Light truncal assist when moving sit>supine for slow eccentric lowering.    Transfers Overall transfer level:  Needs assistance Equipment used: None Transfers: Bed to chair/wheelchair/BSC         Anterior-Posterior transfers: Min assist   General transfer comment: supervision for scoot forward into w/c, light steadying assist when scooting forward onto bed. sequencing cues    Ambulation/Gait                   Psychologist, counselling mobility: Yes Wheelchair propulsion: Both lower extermities Wheelchair parts: Supervision/cueing Distance: 100 Wheelchair Assistance Details (indicate cue type and reason): cues for hallway navigation   Tilt Bed    Modified Rankin (Stroke Patients Only)       Balance Overall balance assessment: Needs assistance Sitting-balance support: Bilateral upper extremity supported Sitting balance-Leahy Scale: Fair                                      Cognition Arousal: Alert Behavior During Therapy: Flat affect Overall Cognitive Status: History of cognitive impairments - at baseline                                 General Comments: WFL for session, limited verbalization        Exercises Amputee Exercises Hip Extension: AROM, Right, Seated, 15 reps (following hip flexion) Hip Flexion/Marching: AROM, Right, 15 reps, Seated Knee Extension: AROM, Right, 15 reps, Seated    General Comments  Pertinent Vitals/Pain Pain Assessment Pain Assessment: Faces Faces Pain Scale: Hurts little more Pain Location: RLE Pain Descriptors / Indicators: Discomfort, Constant, Aching, Dull Pain Intervention(s): Limited activity within patient's tolerance, Monitored during session, Repositioned    Home Living     Available Help at Discharge: Family;Available 24 hours/day Type of Home: House                  Prior Function            PT Goals (current goals can now be found in the care plan section) Acute Rehab PT Goals Patient Stated Goal: Return home  with spouse PT Goal Formulation: With patient Time For Goal Achievement: 04/17/23 Potential to Achieve Goals: Good Progress towards PT goals: Progressing toward goals    Frequency    Min 1X/week      PT Plan      Co-evaluation              AM-PAC PT "6 Clicks" Mobility   Outcome Measure  Help needed turning from your back to your side while in a flat bed without using bedrails?: A Little Help needed moving from lying on your back to sitting on the side of a flat bed without using bedrails?: A Little Help needed moving to and from a bed to a chair (including a wheelchair)?: A Little Help needed standing up from a chair using your arms (e.g., wheelchair or bedside chair)?: A Little Help needed to walk in hospital room?: Total Help needed climbing 3-5 steps with a railing? : Total 6 Click Score: 14    End of Session   Activity Tolerance: Patient tolerated treatment well;Patient limited by fatigue Patient left: in bed;with call bell/phone within reach;with bed alarm set Nurse Communication: Mobility status PT Visit Diagnosis: Other abnormalities of gait and mobility (R26.89);Muscle weakness (generalized) (M62.81)     Time: 1610-9604; 5409-8119  PT Time Calculation (min) (ACUTE ONLY): 22 min  Charges:    $Therapeutic Activity: 8-22 mins PT General Charges $$ ACUTE PT VISIT: 1 Visit                     Marye Round, PT DPT Acute Rehabilitation Services Secure Chat Preferred  Office [phone]    [name] 04/07/2023, 12:10 PM
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
## 2023-04-07 NOTE — Progress Notes (Signed)
PROGRESS NOTE  Troy [name]  DOB: 08/04/1960  PCP: Rebecka Apley, NP XBJ:[id number]  DOA: 04/02/2023  LOS: 5 days  Hospital Day: 6  Brief narrative: Troy [name] is a 62 y.o. male with PMH significant for DM2, HTN, HLD, chronic diastolic CHF, paroxysmal A-fib/flutter, PAD s/p stenting of the right femoral artery on 8/12, COPD, liver cirrhosis.  Hospitalized 8/16 - 8/28 for A-fib with RVR, improved with Cardizem drip.  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 Lajoyce Corners on 02/09/2023 9/20,  underwent surgery for dehiscence of the right transmetatarsal amputation and underwent a right below-knee amputation. He had previous left BKA from 2022. He was discharged to [facility] on 10/3 . 10/12, patient presented to the ED with complaints of shortness of breath.   Workup in the ED showed chest x-ray with pulm edema Admitted for CHF exacerbation.  His hospitalization was complicated by atrial flutter and hypotension.  He developed respiratory failure during that time thought to be secondary to pulmonary edema.  Patient was given furosemide in the ED and was hospitalized for further management.   See below for details  Subjective: Patient was seen and examined this morning. Propped up in bed.  Not in distress.  More awake today. Seen by PT/OT.  Assessment and plan.: Acute diastolic CHF LV apical aneurysm Patient initially required 3 L of oxygen by nasal cannula.   Chest x-ray on admission showed pulmonary edema.   Patient is aggressively diuresed with IV Lasix. Subsequent chest x-ray shows improvement though still has edema and effusion.   Currently on oral Toprol 12.5 mg daily, Lasix 40 mg daily, [name] 10 mg daily Echo 10/17 noted apical aneurysm that also been seen on multiple previous CT scans, EF 70 to 75%, grade 2 diastolic dysfunction, severely elevated pulm artery systolic pressure 63  Acute  respiratory failure with hypoxia Respiratory status has improved.  Currently breathing on room air  TIA Patient acute neurological symptoms this morning. He had an episode of right facial droop, right-sided flaccid, tongue protrusion out of the right side Code stroke was called.  Seen by neurology.  CT head did not show any acute intracranial hemorrhage or cortical infarct.  There were age indeterminate  infarcts in both arms.  CT angio head did not show large vessel occlusion or high-grade stenosis. TIA workup completed PTA meds-Eliquis, Crestor.  Continue both   Paroxysmal atrial flutter Seen by cardiology during previous hospitalization.  Underwent cardioversion which was not successful.  Was evaluated by electrophysiology.  Started on amiodarone as well as metoprolol.  Dose reduced due to bradycardia.   Chronically anticoagulated with apixaban.    Leukocytosis Possibly reactive.  Continue to monitor Repeat WBC count and lactic acid level tomorrow Recent Labs  Lab 04/02/23 1204 04/02/23 1346 04/03/23 0325 04/04/23 0307 04/07/23 0523  WBC 15.0*  --  15.0* 16.3* 13.0*  LATICACIDVEN  --  2.2*  --   --   --   PROCALCITON <0.10  --   --   --   --    Type 2 diabetes mellitus uncontrolled with hyperglycemia A1c 11.7.  For PTA meds-Tresiba 10 units daily, sliding scale statin, [name] 10 mg daily, glipizide 10 mg twice daily, Currently on Semglee 10 units daily, glipizide 2.5 p.o. twice daily and SSI/Accu-Cheks.  Recent Labs  Lab 04/06/23 1216 04/06/23 1650 04/06/23 2118 04/07/23 0639 04/07/23 1158  GLUCAP 190* 151* 110* 129* 188*  PAD s/p stent in August 2024 S/p right BKA Seen by Dr. Lajoyce Corners.  Wound thought to be clean.  Staples to be removed next week.  Finished a course of doxycycline.     Chronic normocytic anemia Hemoglobin low but stable. Continue PPI Continue monitor Recent Labs    03/23/23 0418 04/02/23 1204 04/03/23 0325 04/04/23 0307 04/07/23 0523  HGB  9.1* 10.4* 9.8* 10.1* 10.6*  MCV 86.2 80.4 80.0 79.7* 79.1*   H/o COPD Continue bronchodilators  Anxiety/depression Continue sertraline    Mobility: Seen by PT.  SNF recommended  Goals of care   Code Status: Limited: Do not attempt resuscitation (DNR) -DNR-LIMITED -Do Not Intubate/DNI      DVT prophylaxis:   apixaban (ELIQUIS) tablet 5 mg   Antimicrobials: None Fluid: None Consultants: Neurology Family Communication: Wife at bedside  Status: Inpatient Level of care:  Telemetry Medical   Patient is from: [facility] Anticipated d/c to: Likely discharge back to [facility] tomorrow    Diet:  Diet Order             Diet heart healthy/carb modified Room service appropriate? Yes; Fluid consistency: Thin  Diet effective now           Diet - low sodium heart healthy                   Scheduled Meds:  amiodarone  200 mg Oral Daily   apixaban  5 mg Oral BID   empagliflozin  10 mg Oral QAC breakfast   furosemide  40 mg Oral Daily   glipiZIDE  2.5 mg Oral BID AC   insulin aspart  0-15 Units Subcutaneous TID WC   insulin glargine-yfgn  10 Units Subcutaneous Daily   metoprolol succinate  12.5 mg Oral Daily   nicotine  14 mg Transdermal Daily   pantoprazole  40 mg Oral Daily   rosuvastatin  40 mg Oral Daily   sertraline  50 mg Oral Daily   sodium chloride flush  3 mL Intravenous Q12H    PRN meds: acetaminophen **OR** acetaminophen, albuterol, hydrALAZINE, iohexol, ondansetron **OR** ondansetron (ZOFRAN) IV, oxyCODONE-acetaminophen **AND** oxyCODONE, polyethylene glycol   Infusions:    Antimicrobials: Anti-infectives (From admission, onward)    Start     Dose/Rate Route Frequency Ordered Stop   04/02/23 2200  doxycycline (VIBRA-TABS) tablet 100 mg        100 mg Oral 2 times daily 04/02/23 1804 04/05/23 2124       Objective: Vitals:   04/07/23 1054 04/07/23 1104  BP:  (!) 146/52  Pulse: 60 (!) 59  Resp:  16  Temp:  98.2 F (36.8 C)  SpO2:  96%     Intake/Output Summary (Last 24 hours) at 04/07/2023 1634 Last data filed at 04/07/2023 1056 Gross per 24 hour  Intake 363 ml  Output 700 ml  Net -337 ml   Filed Weights   04/04/23 0500 04/05/23 0148 04/06/23 0423  Weight: 75.2 kg 75.2 kg 79.4 kg   Weight change:  Body mass index is 24.41 kg/m.   Physical Exam: Prior to code stroke General exam: Pleasant, elderly.  Not in distress Skin: No rashes, lesions or ulcers. HEENT: Atraumatic, normocephalic, no obvious bleeding Lungs: Clear to auscultation bilaterally CVS: Regular rate and rhythm, no murmur GI/Abd soft, nontender, nondistended, possible CNS: Slow to respond but alert, awake, oriented to place and person Psychiatry: Mood appropriate Extremities: Bilateral BKA status  Data Review: I have personally reviewed the laboratory data and  studies available.  F/u labs ordered Unresulted Labs (From admission, onward)     Start     Ordered   04/07/23 0500  CBC with Differential/Platelet  Daily,   R      04/06/23 1458   04/04/23 0500  Basic metabolic panel  Daily,   R      04/03/23 1102            Total time spent in review of labs and imaging, patient evaluation, formulation of plan, documentation and communication with family: 45 minutes  Signed, Lorin Glass, MD [facility] 04/07/2023

## 2023-04-07 NOTE — TOC Progression Note (Signed)
Transition of Care [facility]) - Progression Note    Patient Details  Name: Troy [name] MRN: [id number] Date of Birth: August 08, 1960  Transition of Care [facility]) CM/SW Contact  Dellie Burns [place], [place] Phone Number: 04/07/2023, 8:55 AM  Clinical Narrative:   [facility] is offering a P2P review for SNF auth. MD provided with detail to complete review which expires at 12pm today. SW will follow.   Dellie Burns, MSW, LCSW [phone] (coverage)      Expected Discharge Plan: Skilled Nursing Facility Barriers to Discharge: Continued Medical Work up  Expected Discharge Plan and Services       Living arrangements for the past 2 months: Skilled Nursing Facility, Single Family Home                                       Social Determinants of Health (SDOH) Interventions SDOH Screenings   Food Insecurity: No Food Insecurity (04/02/2023)  Housing: Low Risk  (04/02/2023)  Transportation Needs: No Transportation Needs (04/02/2023)  Utilities: Not At Risk (04/02/2023)  Financial Resource Strain: Low Risk  (04/09/2021)   Received from [facility], [facility]  Physical Activity: Insufficiently Active (04/09/2021)   Received from [facility], [facility]  Social Connections: Unknown (10/22/2021)   Received from [facility], [facility]  Stress: No Stress Concern Present (04/09/2021)   Received from [facility], [facility]  Tobacco Use: High Risk (04/02/2023)    Readmission Risk Interventions    08/25/2021   12:46 PM  Readmission Risk Prevention Plan  Transportation Screening Complete  Medication Review (RN Care Manager) Complete  PCP or Specialist appointment within 3-5 days of discharge Complete  HRI or Home Care Consult Complete  SW Recovery Care/Counseling Consult Complete  Palliative Care Screening Not Applicable  Skilled Nursing Facility Complete

## 2023-04-07 NOTE — Progress Notes (Signed)
  Echocardiogram 2D Echocardiogram has been performed.  Sheralyn Boatman R 04/07/2023, 10:09 AM

## 2023-04-07 NOTE — TOC Progression Note (Signed)
Transition of Care [facility]) - Progression Note    Patient Details  Name: Troy [name] MRN: [id number] Date of Birth: June 07, 1961  Transition of Care [facility]) CM/SW Contact  Dellie Burns [place], [place] Phone Number: 04/07/2023, 3:34 PM  Clinical Narrative:  P2P review was successful, auth received [id number], valid 10/17-10/21. Spoke to [place] at [facility] who reports they are able to accept pt beginning tomorrow. MD updated.   Dellie Burns, MSW, LCSW [phone] (coverage)       Expected Discharge Plan: Skilled Nursing Facility Barriers to Discharge: Continued Medical Work up  Expected Discharge Plan and Services       Living arrangements for the past 2 months: Skilled Nursing Facility, Single Family Home                                       Social Determinants of Health (SDOH) Interventions SDOH Screenings   Food Insecurity: No Food Insecurity (04/02/2023)  Housing: Low Risk  (04/02/2023)  Transportation Needs: No Transportation Needs (04/02/2023)  Utilities: Not At Risk (04/02/2023)  Financial Resource Strain: Low Risk  (04/09/2021)   Received from [facility], [facility]  Physical Activity: Insufficiently Active (04/09/2021)   Received from [facility], [facility]  Social Connections: Unknown (10/22/2021)   Received from [facility], [facility]  Stress: No Stress Concern Present (04/09/2021)   Received from [facility], [facility]  Tobacco Use: High Risk (04/02/2023)    Readmission Risk Interventions    08/25/2021   12:46 PM  Readmission Risk Prevention Plan  Transportation Screening Complete  Medication Review (RN Care Manager) Complete  PCP or Specialist appointment within 3-5 days of discharge Complete  HRI or Home Care Consult Complete  SW Recovery Care/Counseling Consult Complete  Palliative Care Screening Not Applicable  Skilled Nursing Facility Complete

## 2023-04-08 ENCOUNTER — Ambulatory Visit: Payer: Medicare Other | Admitting: Physician Assistant

## 2023-04-08 DIAGNOSIS — G930 Cerebral cysts: Secondary | ICD-10-CM | POA: Insufficient documentation

## 2023-04-08 DIAGNOSIS — I253 Aneurysm of heart: Secondary | ICD-10-CM | POA: Insufficient documentation

## 2023-04-08 DIAGNOSIS — I5033 Acute on chronic diastolic (congestive) heart failure: Secondary | ICD-10-CM | POA: Diagnosis not present

## 2023-04-08 LAB — CBC WITH DIFFERENTIAL/PLATELET
Abs Immature Granulocytes: 0.04 10*3/uL (ref 0.00–0.07)
Basophils Absolute: 0.1 10*3/uL (ref 0.0–0.1)
Basophils Relative: 1 %
Eosinophils Absolute: 0.3 10*3/uL (ref 0.0–0.5)
Eosinophils Relative: 3 %
HCT: 35.2 % — ABNORMAL LOW (ref 39.0–52.0)
Hemoglobin: 10.6 g/dL — ABNORMAL LOW (ref 13.0–17.0)
Immature Granulocytes: 0 %
Lymphocytes Relative: 26 %
Lymphs Abs: 3 10*3/uL (ref 0.7–4.0)
MCH: 23.1 pg — ABNORMAL LOW (ref 26.0–34.0)
MCHC: 30.1 g/dL (ref 30.0–36.0)
MCV: 76.9 fL — ABNORMAL LOW (ref 80.0–100.0)
Monocytes Absolute: 1 10*3/uL (ref 0.1–1.0)
Monocytes Relative: 9 %
Neutro Abs: 6.8 10*3/uL (ref 1.7–7.7)
Neutrophils Relative %: 61 %
Platelets: 385 10*3/uL (ref 150–400)
RBC: 4.58 MIL/uL (ref 4.22–5.81)
RDW: 17.9 % — ABNORMAL HIGH (ref 11.5–15.5)
WBC: 11.2 10*3/uL — ABNORMAL HIGH (ref 4.0–10.5)
nRBC: 0 % (ref 0.0–0.2)

## 2023-04-08 LAB — BASIC METABOLIC PANEL
Anion gap: 12 (ref 5–15)
BUN: 21 mg/dL (ref 8–23)
CO2: 29 mmol/L (ref 22–32)
Calcium: 9.4 mg/dL (ref 8.9–10.3)
Chloride: 100 mmol/L (ref 98–111)
Creatinine, Ser: 0.65 mg/dL (ref 0.61–1.24)
GFR, Estimated: >60 mL/min (ref >60)
Glucose, Bld: 110 mg/dL — ABNORMAL HIGH (ref 70–99)
Potassium: 3.7 mmol/L (ref 3.5–5.1)
Sodium: 141 mmol/L (ref 135–145)

## 2023-04-08 LAB — GLUCOSE, CAPILLARY
Glucose-Capillary: 121 mg/dL — ABNORMAL HIGH (ref 70–99)
Glucose-Capillary: 191 mg/dL — ABNORMAL HIGH (ref 70–99)

## 2023-04-08 MED ORDER — GLIPIZIDE 2.5 MG PO TABS: 2.5000 mg | ORAL_TABLET | Freq: Two times a day (BID) | ORAL | Status: AC

## 2023-04-08 NOTE — Plan of Care (Signed)
Problem: Education: Goal: Knowledge of the prescribed therapeutic regimen will improve Outcome: Adequate for Discharge Goal: Ability to verbalize activity precautions or restrictions will improve Outcome: Adequate for Discharge Goal: Understanding of discharge needs will improve Outcome: Adequate for Discharge   Problem: Activity: Goal: Ability to perform//tolerate increased activity and mobilize with assistive devices will improve Outcome: Adequate for Discharge   Problem: Clinical Measurements: Goal: Postoperative complications will be avoided or minimized Outcome: Adequate for Discharge   Problem: Self-Care: Goal: Ability to meet self-care needs will improve Outcome: Adequate for Discharge   Problem: Self-Concept: Goal: Ability to maintain and perform role responsibilities to the fullest extent possible will improve Outcome: Adequate for Discharge   Problem: Pain Management: Goal: Pain level will decrease with appropriate interventions Outcome: Adequate for Discharge   Problem: Skin Integrity: Goal: Demonstration of wound healing without infection will improve Outcome: Adequate for Discharge   Problem: Education: Goal: Understanding of CV disease, CV risk reduction, and recovery process will improve Outcome: Adequate for Discharge Goal: Individualized Educational Video(s) Outcome: Adequate for Discharge   Problem: Activity: Goal: Ability to return to baseline activity level will improve Outcome: Adequate for Discharge   Problem: Cardiovascular: Goal: Ability to achieve and maintain adequate cardiovascular perfusion will improve Outcome: Adequate for Discharge Goal: Vascular access site(s) Level 0-1 will be maintained Outcome: Adequate for Discharge   Problem: Health Behavior/Discharge Planning: Goal: Ability to safely manage health-related needs after discharge will improve Outcome: Adequate for Discharge   Problem: Education: Goal: Ability to demonstrate  management of disease process will improve Outcome: Adequate for Discharge Goal: Ability to verbalize understanding of medication therapies will improve Outcome: Adequate for Discharge Goal: Individualized Educational Video(s) Outcome: Adequate for Discharge   Problem: Activity: Goal: Capacity to carry out activities will improve Outcome: Adequate for Discharge   Problem: Cardiac: Goal: Ability to achieve and maintain adequate cardiopulmonary perfusion will improve Outcome: Adequate for Discharge   Problem: Education: Goal: Ability to describe self-care measures that may prevent or decrease complications (Diabetes Survival Skills Education) will improve Outcome: Adequate for Discharge Goal: Individualized Educational Video(s) Outcome: Adequate for Discharge   Problem: Coping: Goal: Ability to adjust to condition or change in health will improve Outcome: Adequate for Discharge   Problem: Fluid Volume: Goal: Ability to maintain a balanced intake and output will improve Outcome: Adequate for Discharge   Problem: Health Behavior/Discharge Planning: Goal: Ability to identify and utilize available resources and services will improve Outcome: Adequate for Discharge Goal: Ability to manage health-related needs will improve Outcome: Adequate for Discharge   Problem: Metabolic: Goal: Ability to maintain appropriate glucose levels will improve Outcome: Adequate for Discharge   Problem: Nutritional: Goal: Maintenance of adequate nutrition will improve Outcome: Adequate for Discharge Goal: Progress toward achieving an optimal weight will improve Outcome: Adequate for Discharge   Problem: Skin Integrity: Goal: Risk for impaired skin integrity will decrease Outcome: Adequate for Discharge   Problem: Tissue Perfusion: Goal: Adequacy of tissue perfusion will improve Outcome: Adequate for Discharge   Problem: Education: Goal: Knowledge of General Education information will  improve Description: Including pain rating scale, medication(s)/side effects and non-pharmacologic comfort measures Outcome: Adequate for Discharge   Problem: Health Behavior/Discharge Planning: Goal: Ability to manage health-related needs will improve Outcome: Adequate for Discharge   Problem: Clinical Measurements: Goal: Ability to maintain clinical measurements within normal limits will improve Outcome: Adequate for Discharge Goal: Will remain free from infection Outcome: Adequate for Discharge Goal: Diagnostic test results will improve Outcome: Adequate for Discharge  Goal: Respiratory complications will improve Outcome: Adequate for Discharge Goal: Cardiovascular complication will be avoided Outcome: Adequate for Discharge   Problem: Activity: Goal: Risk for activity intolerance will decrease Outcome: Adequate for Discharge   Problem: Nutrition: Goal: Adequate nutrition will be maintained Outcome: Adequate for Discharge   Problem: Coping: Goal: Level of anxiety will decrease Outcome: Adequate for Discharge   Problem: Elimination: Goal: Will not experience complications related to bowel motility Outcome: Adequate for Discharge Goal: Will not experience complications related to urinary retention Outcome: Adequate for Discharge   Problem: Pain Managment: Goal: General experience of comfort will improve Outcome: Adequate for Discharge   Problem: Safety: Goal: Ability to remain free from injury will improve Outcome: Adequate for Discharge
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
## 2023-04-08 NOTE — TOC Transition Note (Signed)
Transition of Care [facility]) - CM/SW Discharge Note   Patient Details  Name: Troy [name] MRN: [id number] Date of Birth: 1961-02-01  Transition of [facility]) CM/SW Contact:  Navin Dogan Felipa Emory, Student-Social Work Phone Number: 04/08/2023, 1:37 PM   Clinical Narrative:   MSW Student received insurance approval for patient to admit to [facility]. MSW Student confirmed with MD that patient is medically stable for discharge. MSW student notified patients wife [name] and they are in agreement with discharge.  MSW Student confirmed bed at [facility]. Transport arranged with [facility] for an hour.   Number to call report : [phone] RM: [phone] B    Final next level of care: Skilled Nursing Facility Barriers to Discharge: Barriers Resolved   Patient Goals and CMS Choice      Discharge Placement                Patient chooses bed at: [facility] Patient to be transferred to facility by: [facility] Name of family member notified: [name] Patient and family notified of of transfer: 04/08/23  Discharge Plan and Services Additional resources added to the After Visit Summary for                                       Social Determinants of Health (SDOH) Interventions SDOH Screenings   Food Insecurity: No Food Insecurity (04/02/2023)  Housing: Low Risk  (04/02/2023)  Transportation Needs: No Transportation Needs (04/02/2023)  Utilities: Not At Risk (04/02/2023)  Financial Resource Strain: Low Risk  (04/09/2021)   Received from [facility]  Physical Activity: Insufficiently Active (04/09/2021)   Received from [facility], [facility]  Social Connections: Unknown (10/22/2021)   Received from [facility], [facility]  Stress: No Stress Concern Present (04/09/2021)   Received from [facility], [facility]  Tobacco Use: High Risk (04/02/2023)     Readmission Risk Interventions    08/25/2021   12:46 PM  Readmission Risk Prevention Plan  Transportation  Screening Complete  Medication Review (RN Care Manager) Complete  PCP or Specialist appointment within 3-5 days of discharge Complete  HRI or Home Care Consult Complete  SW Recovery Care/Counseling Consult Complete  Palliative Care Screening Not Applicable  Skilled Nursing Facility Complete

## 2023-04-08 NOTE — [place] FL2 (Signed)
[place] MEDICAID FL2 LEVEL OF CARE FORM     IDENTIFICATION  Patient Name: Troy [name] Birthdate: [birth year] Sex: male Admission Date (Current Location): 04/02/2023  [facility] and [place] Number:  Producer, television/film/video and Address:  [facility], [address] [place], [place] [address]      Provider Number: [id number]  Attending Physician Name and Address:  Lorin Glass, MD  Relative Name and Phone Number:       Current Level of Care: Hospital Recommended Level of Care: Skilled Nursing Facility Prior Approval Number:    Date Approved/Denied:   PASRR Number:    Discharge Plan: SNF    Current Diagnoses: Patient Active Problem List   Diagnosis Date Noted   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   Ventricular aneurysm 04/08/2023   (HFpEF) heart failure with preserved ejection fraction (HCC) 04/02/2023   Lactic acidosis 04/02/2023   Acute respiratory failure with hypoxia (HCC) 04/02/2023   Leukocytosis 04/02/2023   Uncontrolled type 2 diabetes mellitus with hyperglycemia, with long-term current use of insulin (HCC) 04/02/2023   Hypochromic anemia 04/02/2023   PVD (peripheral vascular disease) (HCC) 04/02/2023   Thrombocytosis 04/02/2023   Respiratory distress 03/21/2023   Osteomyelitis of ankle or foot, acute, right (HCC) 03/11/2023   Abscess of right lower leg 03/11/2023   Below-knee amputation of right lower extremity (HCC) 03/11/2023   Chronic respiratory failure with hypoxia (HCC) 02/12/2023   Pulmonary infiltrates 02/12/2023   Acute on chronic heart failure with preserved ejection fraction (HFpEF) (HCC) 02/12/2023   Chronic osteomyelitis of right foot with draining sinus (HCC) 02/07/2023   Paroxysmal atrial flutter (HCC) 02/05/2023   Hypotension 02/05/2023   Atrial fibrillation with RVR (HCC) 02/04/2023   Atrial flutter (HCC) 02/04/2023   Lower limb ischemia: ??? 08/22/2021   Diabetic acidosis without coma (HCC)    Hypokalemia    Hypomagnesemia     Hyponatremia    Multifocal pneumonia    Parapneumonic effusion 08/18/2021   Bowel Ileus (HCC) 08/18/2021   ?? 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   Ketoacidosis due to type 2 diabetes mellitus (HCC) 08/15/2021   COVID-19 virus infection 08/15/2021   S/P AKA (above knee amputation), left (HCC) 02/12/2021   Amputation of right great toe and 2nd Toe 02/12/2021   Sepsis due to left-sided neck cellulitis/MRSA 02/12/2021   Hypotensive episode    Elevated troponin    SIRS (systemic inflammatory response syndrome) (HCC) 02/11/2021   Abscess of bursa of left ankle 01/21/2021   Dehiscence of amputation stump (HCC)    Ischemia of left BKA site (HCC)    Abscess of leg without foot, left    Osteomyelitis of second toe of right foot (HCC) 07/12/2019   Below knee amputation status, left 01/25/2018   Wound dehiscence, surgical    Status post left foot surgery 12/15/2017   GERD (gastroesophageal reflux disease) 09/26/2017   Hyperlipidemia associated with type 2 diabetes mellitus (HCC) 07/08/2017   Snoring 06/07/2016   Chest tube in place 01/22/2016   Abnormal nuclear stress test 01/22/2016   Pain, chronic due to trauma 07/04/2012   Complex regional pain syndrome I of lower limb 07/04/2012   COPD (chronic obstructive pulmonary disease) (HCC) 01/27/2011   Pre-operative cardiovascular examination 01/27/2011   Nonspecific abnormal electrocardiogram (ECG) (EKG) 01/27/2011   Murmur 01/27/2011   DM2 (diabetes mellitus, type 2) (HCC)    HTN (hypertension)    HLD (hyperlipidemia)    Tobacco abuse     Orientation RESPIRATION BLADDER Height &  Weight     Self, Place  Normal Continent Weight: 175 lb 0.7 oz (79.4 kg) Height:  5\' 11"  (180.3 cm)  BEHAVIORAL SYMPTOMS/MOOD NEUROLOGICAL BOWEL NUTRITION STATUS      Continent Diet (heart healthy/carb modified)  AMBULATORY STATUS COMMUNICATION OF NEEDS Skin   Limited Assist Verbally Surgical wounds (closed right leg, gauze dressing: change daily)                        Personal Care Assistance Level of Assistance  Bathing, Feeding, Dressing Bathing Assistance: Limited assistance Feeding assistance: Limited assistance Dressing Assistance: Limited assistance     Functional Limitations Info  Sight Sight Info: Impaired        SPECIAL CARE FACTORS FREQUENCY  PT (By licensed PT), OT (By licensed OT)     PT Frequency: 5x/wk OT Frequency: 5x/wk            Contractures Contractures Info: Not present    Additional Factors Info  Code Status, Allergies, Psychotropic, Insulin Sliding Scale Code Status Info: DNR Allergies Info: Hydromorphone Hcl Er, Nucynta (Tapentadol), Exalamide Psychotropic Info: Zoloft 50mg  daily Insulin Sliding Scale Info: see DC summary       Current Medications (04/08/2023):  This is the current hospital active medication list Current Facility-Administered Medications  Medication Dose Route Frequency Provider Last Rate Last Admin   acetaminophen (TYLENOL) tablet 650 mg  650 mg Oral Q6H PRN Clydie Braun, MD   650 mg at 04/05/23 2123   Or   acetaminophen (TYLENOL) suppository 650 mg  650 mg Rectal Q6H PRN Madelyn Flavors A, MD       albuterol (PROVENTIL) (2.5 MG/3ML) 0.083% nebulizer solution 2.5 mg  2.5 mg Nebulization Q4H PRN Madelyn Flavors A, MD       amiodarone (PACERONE) tablet 200 mg  200 mg Oral Daily Katrinka Blazing, Rondell A, MD   200 mg at 04/08/23 4696   apixaban (ELIQUIS) tablet 5 mg  5 mg Oral BID Madelyn Flavors A, MD   5 mg at 04/08/23 0924   empagliflozin ([name]) tablet 10 mg  10 mg Oral QAC breakfast Madelyn Flavors A, MD   10 mg at 04/08/23 0746   furosemide (LASIX) tablet 40 mg  40 mg Oral Daily Osvaldo Shipper, MD   40 mg at 04/08/23 0924   glipiZIDE (GLUCOTROL) tablet 2.5 mg  2.5 mg Oral BID AC Dahal, Melina Schools, MD   2.5 mg at 04/08/23 0644   hydrALAZINE (APRESOLINE) injection 10 mg  10 mg Intravenous Q4H PRN Madelyn Flavors A, MD       insulin aspart (novoLOG) injection 0-15 Units  0-15  Units Subcutaneous TID WC Madelyn Flavors A, MD   2 Units at 04/08/23 0645   insulin glargine-yfgn (SEMGLEE) injection 10 Units  10 Units Subcutaneous Daily Madelyn Flavors A, MD   10 Units at 04/08/23 0924   iohexol (OMNIPAQUE) 350 MG/ML injection 75 mL  75 mL Intravenous Once PRN Milon Dikes, MD       nicotine (NICODERM CQ - dosed in mg/24 hours) patch 14 mg  14 mg Transdermal Daily Smith, Rondell A, MD   14 mg at 04/08/23 0922   ondansetron (ZOFRAN) tablet 4 mg  4 mg Oral Q6H PRN Madelyn Flavors A, MD       Or   ondansetron (ZOFRAN) injection 4 mg  4 mg Intravenous Q6H PRN Smith, Rondell A, MD       oxyCODONE-acetaminophen (PERCOCET/ROXICET) 5-325 MG per tablet 1 tablet  1  tablet Oral Q4H PRN Clydie Braun, MD   1 tablet at 04/08/23 2706   And   oxyCODONE (Oxy IR/ROXICODONE) immediate release tablet 5 mg  5 mg Oral Q4H PRN Madelyn Flavors A, MD   5 mg at 04/07/23 2127   pantoprazole (PROTONIX) EC tablet 40 mg  40 mg Oral Daily Smith, Rondell A, MD   40 mg at 04/08/23 0924   polyethylene glycol (MIRALAX / GLYCOLAX) packet 17 g  17 g Oral Daily PRN Dahal, Melina Schools, MD   17 g at 04/06/23 2203   rosuvastatin (CRESTOR) tablet 40 mg  40 mg Oral Daily Smith, Rondell A, MD   40 mg at 04/08/23 0923   sertraline (ZOLOFT) tablet 50 mg  50 mg Oral Daily Smith, Rondell A, MD   50 mg at 04/08/23 2376   sodium chloride flush (NS) 0.9 % injection 3 mL  3 mL Intravenous Q12H Madelyn Flavors A, MD   3 mL at 04/08/23 2831     Discharge Medications: Please see discharge summary for a list of discharge medications.  Relevant Imaging Results:  Relevant Lab Results:   Additional Information SS#: [id number]  Baldemar Lenis, LCSW

## 2023-04-08 NOTE — Progress Notes (Signed)
Pt wheeled off unit by PTAR. Report Given to RN at Facility by Main RN Drucie Ip. Pt sent off with all his belongings.

## 2023-04-11 ENCOUNTER — Ambulatory Visit (INDEPENDENT_AMBULATORY_CARE_PROVIDER_SITE_OTHER): Payer: Medicare Other | Admitting: Orthopedic Surgery

## 2023-04-11 DIAGNOSIS — Z89511 Acquired absence of right leg below knee: Secondary | ICD-10-CM

## 2023-04-11 DIAGNOSIS — T8781 Dehiscence of amputation stump: Secondary | ICD-10-CM

## 2023-04-12 ENCOUNTER — Encounter: Payer: Self-pay | Admitting: Orthopedic Surgery

## 2023-04-12 NOTE — Progress Notes (Signed)
Office Visit Note   Patient: Troy [name]           Date of Birth: 09/18/1960           MRN: [id number] Visit Date: 04/11/2023              Requested by: Rebecka Apley, NP [address] [place],  [place] [address] PCP: Rebecka Apley, NP  Chief Complaint  Patient presents with   Right Leg - Routine Post Op    03/11/23 right BKA      HPI: Patient is a 62 year old gentleman who presents in follow-up 4 weeks status post right below-knee amputation.  Initial presentation to the emergency room was for shortness of breath with congestive heart failure.  Assessment & Plan: Visit Diagnoses:  1. Dehiscence of amputation stump (HCC)   2. Right below-knee amputee [facility])     Plan: The incision is well-healed staples are harvested.  Patient is provided a prescription for a new size large stump shrinker.  Follow-Up Instructions: Return in about 3 weeks (around 05/02/2023).   Ortho Exam  Patient is alert, oriented, no adenopathy, well-dressed, normal affect, normal respiratory effort. Examination the wound edges are well-approximated.  Staples harvested today.  The right knee has range of motion from 0 to 160 degrees.  Patient is a new right transtibial  amputee.  Patient's current comorbidities are not expected to impact the ability to function with the prescribed prosthesis. Patient verbally communicates a strong desire to use a prosthesis. Patient currently requires mobility aids to ambulate without a prosthesis.  Expects not to use mobility aids with a new prosthesis.  Patient is a K2 level ambulator that will use a prosthesis to walk around their home and the community over low level environmental barriers.      Imaging: No results found.    Labs: Lab Results  Component Value Date   HGBA1C 11.7 (H) 02/05/2023   HGBA1C 10.8 (H) 08/18/2021   HGBA1C 12.3 (H) 01/21/2021   ESRSEDRATE 110 (H) 02/13/2023   ESRSEDRATE 56 (H) 02/07/2023   CRP 13.0  (H) 02/07/2023   CRP 22.7 (H) 08/20/2021   CRP 23.2 (H) 08/20/2021   REPTSTATUS 04/07/2023 FINAL 04/02/2023   GRAMSTAIN  03/11/2023    ABUNDANT WBC PRESENT, PREDOMINANTLY PMN RARE GRAM POSITIVE COCCI IN PAIRS    CULT  04/02/2023    NO GROWTH 5 DAYS Performed at [facility], [address] [place], [place] [address]    LABORGA METHICILLIN RESISTANT STAPHYLOCOCCUS AUREUS 03/11/2023     Lab Results  Component Value Date   ALBUMIN 2.2 (L) 03/23/2023   ALBUMIN 1.9 (L) 03/18/2023   ALBUMIN 1.8 (L) 03/16/2023   PREALBUMIN 5 (L) 03/11/2023    Lab Results  Component Value Date   MG 1.7 03/18/2023   MG 1.6 (L) 02/13/2023   MG 1.8 02/12/2023   No results found for: "VD25OH"  Lab Results  Component Value Date   PREALBUMIN 5 (L) 03/11/2023      Latest Ref Rng & Units 04/08/2023    7:47 AM 04/07/2023    5:23 AM 04/04/2023    3:07 AM  CBC EXTENDED  WBC 4.0 - 10.5 K/uL 11.2  13.0  16.3   RBC 4.22 - 5.81 MIL/uL 4.58  4.46  4.34   Hemoglobin 13.0 - 17.0 g/dL 83.1  51.7  61.6   HCT 39.0 - 52.0 % 35.2  35.3  34.6   Platelets 150 - 400  K/uL 385  399  404   NEUT# 1.7 - 7.7 K/uL 6.8  8.5    Lymph# 0.7 - 4.0 K/uL 3.0  3.0       There is no height or weight on file to calculate BMI.  Orders:  No orders of the defined types were placed in this encounter.  No orders of the defined types were placed in this encounter.    Procedures: No procedures performed  Clinical Data: No additional findings.  ROS:  All other systems negative, except as noted in the HPI. Review of Systems  Objective: Vital Signs: There were no vitals taken for this visit.  Specialty Comments:  No specialty comments available.  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   (HFpEF) heart failure with preserved ejection fraction (HCC) 04/02/2023   Lactic acidosis 04/02/2023   Acute respiratory failure with  hypoxia (HCC) 04/02/2023   Leukocytosis 04/02/2023   Uncontrolled type 2 diabetes mellitus with hyperglycemia, with long-term current use of insulin (HCC) 04/02/2023   Hypochromic anemia 04/02/2023   PVD (peripheral vascular disease) (HCC) 04/02/2023   Thrombocytosis 04/02/2023   Respiratory distress 03/21/2023   Osteomyelitis of ankle or foot, acute, right (HCC) 03/11/2023   Abscess of right lower leg 03/11/2023   Below-knee amputation of right lower extremity (HCC) 03/11/2023   Chronic respiratory failure with hypoxia (HCC) 02/12/2023   Pulmonary infiltrates 02/12/2023   Acute on chronic heart failure with preserved ejection fraction (HFpEF) (HCC) 02/12/2023   Chronic osteomyelitis of right foot with draining sinus (HCC) 02/07/2023   Paroxysmal atrial flutter (HCC) 02/05/2023   Hypotension 02/05/2023   Atrial fibrillation with RVR (HCC) 02/04/2023   Atrial flutter (HCC) 02/04/2023   Lower limb ischemia: ??? 08/22/2021   Diabetic acidosis without coma (HCC)    Hypokalemia    Hypomagnesemia    Hyponatremia    Multifocal pneumonia    Parapneumonic effusion 08/18/2021   Bowel Ileus (HCC) 08/18/2021   ?? 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   Ketoacidosis due to type 2 diabetes mellitus (HCC) 08/15/2021   COVID-19 virus infection 08/15/2021   S/P AKA (above knee amputation), left (HCC) 02/12/2021   Amputation of right great toe and 2nd Toe 02/12/2021   Sepsis due to left-sided neck cellulitis/MRSA 02/12/2021   Hypotensive episode    Elevated troponin    SIRS (systemic inflammatory response syndrome) (HCC) 02/11/2021   Abscess of bursa of left ankle 01/21/2021   Dehiscence of amputation stump (HCC)    Ischemia of left BKA site (HCC)    Abscess of leg without foot, left    Osteomyelitis of second toe of right foot (HCC) 07/12/2019   Below knee amputation status, left 01/25/2018   Wound dehiscence, surgical    Status post left foot surgery 12/15/2017   GERD (gastroesophageal  reflux disease) 09/26/2017   Hyperlipidemia associated with type 2 diabetes mellitus (HCC) 07/08/2017   Snoring 06/07/2016   Chest tube in place 01/22/2016   Abnormal nuclear stress test 01/22/2016   Pain, chronic due to trauma 07/04/2012   Complex regional pain syndrome I of lower limb 07/04/2012   COPD (chronic obstructive pulmonary disease) (HCC) 01/27/2011   Pre-operative cardiovascular examination 01/27/2011   Nonspecific abnormal electrocardiogram (ECG) (EKG) 01/27/2011   Murmur 01/27/2011   DM2 (diabetes mellitus, type 2) (HCC)    HTN (hypertension)    HLD (hyperlipidemia)    Tobacco abuse    Past  Medical History:  Diagnosis Date   Acute renal failure (HCC)    in setting of NSAID use and orthopedic surgery 2010   Anxiety and depression    Chronic diastolic CHF (congestive heart failure) (HCC)    a. Echo 6/17: severe conc LVH, vigorous EF, EF 65-70%, no dynamic obstruction, no RWMA, Gr 1 DD, mild TR  //  b. 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 LHC 2002: irregs  //  b. LHC in 8/17: no sig CAD, apical DK, hyperdynamic LV, LVEDP 28   History of kidney stones    History of nuclear stress test    a. Nuc 7/17: Overall, intermediate risk nuclear stress test secondary to small size of apical lateral defect and reduced ejection fraction.  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    Kidney calculi    Palpitations    Pneumonia    PTSD (post-traumatic stress disorder)    Tobacco abuse    Transient ischemic attack      Family History  Problem Relation Age of Onset   Leukemia Mother 59       died   Lung cancer Father 22       died   Heart attack Brother 35   Heart attack Brother 4   Hypertension Brother        X3   Hypertension Sister        X2   Diabetes Sister    Stroke Sister    Diabetes Sister    Other Brother        Set designer accident    Past Surgical History:  Procedure Laterality Date   ABDOMINAL AORTOGRAM W/LOWER EXTREMITY N/A 01/31/2023   Procedure: ABDOMINAL AORTOGRAM W/LOWER EXTREMITY;  Surgeon: Maeola Harman, MD;  [facility] INVASIVE CV LAB;  Service: Cardiovascular;  Laterality: N/A;   AMPUTATION Left 01/03/2018   Procedure: LEFT MIDFOOT AMPUTATION/REVISION MIDAMPUTAION;  Surgeon: Kathryne Hitch, MD;  Location: [facility] OR;  Service: Orthopedics;  Laterality: Left;   AMPUTATION Left 01/25/2018   Procedure: LEFT BELOW KNEE AMPUTATION;  Surgeon: Nadara Mustard, MD;  Location: [facility] OR;  Service: Orthopedics;  Laterality: Left;   AMPUTATION Left 01/21/2021   Procedure: LEFT ABOVE KNEE AMPUTATION;  Surgeon: Nadara Mustard, MD;  Location[facility] OR;  Service: Orthopedics;  Laterality: Left;   AMPUTATION Right 02/09/2023   Procedure: RIGHT TRANSMETATARSAL AMPUTATION;  Surgeon: Nadara Mustard, MD;  Location: [facility] OR;  Service: Orthopedics;  Laterality: Right;   AMPUTATION Right 03/11/2023   Procedure: RIGHT BELOW KNEE AMPUTATION;  Surgeon: Nadara Mustard, MD;  Location: [facility] OR;  Service: Orthopedics;  Laterality: Right;   AMPUTATION TOE Right 07/17/2019   Procedure: AMPUTATION RIGHT FOOT 2ND TOE;  Surgeon: Kathryne Hitch, MD;  Location: [facility];  Service: Orthopedics;  Laterality: Right;   APPLICATION OF WOUND VAC Left 01/21/2021  Procedure: APPLICATION OF WOUND VAC;  Surgeon: Nadara Mustard, MD;  Location: [facility] OR;  Service: Orthopedics;  Laterality: Left;   BELOW KNEE LEG AMPUTATION Left 01/25/2018   CARDIAC CATHETERIZATION N/A 01/22/2016   Procedure: Left Heart Cath  and Coronary Angiography;  Surgeon: Peter M Swaziland, MD;  Location: [facility] INVASIVE CV LAB;  Service: Cardiovascular;  Laterality: N/A;   CARDIOVERSION N/A 03/17/2023   Procedure: CARDIOVERSION;  Surgeon: Chrystie Nose, MD;  Location: [facility] INVASIVE CV LAB;  Service: Cardiovascular;  Laterality: N/A;   FOOT AMPUTATION Bilateral    I & D EXTREMITY Left 12/15/2017   Procedure: IRRIGATION AND DEBRIDEMENT LEFT FOOT ULCER;  Surgeon: Kathryne Hitch, MD;  Location: [facility] ORS;  Service: Orthopedics;  Laterality: Left;   I & D EXTREMITY Left 07/25/2020   Procedure: LEFT BELOW KNEE AMPUTATION ABSCESS EXCISION AND SKIN GRAFT;  Surgeon: Nadara Mustard, MD;  Location: [facility] OR;  Service: Orthopedics;  Laterality: Left;   I & D EXTREMITY Left 08/22/2020   Procedure: DEBRIDEMENT LEFT BELOW KNEE AMPUTATION AND APPLY KERECIS SKIN GRAFT;  Surgeon: Nadara Mustard, MD;  Location: [facility] OR;  Service: Orthopedics;  Laterality: Left;   LITHOTRIPSY     PERIPHERAL VASCULAR INTERVENTION  01/31/2023   Procedure: PERIPHERAL VASCULAR INTERVENTION;  Surgeon: Maeola Harman, MD;  Location: [facility] INVASIVE CV LAB;  Service: Cardiovascular;;   TEE WITHOUT CARDIOVERSION N/A 03/17/2023   Procedure: TRANSESOPHAGEAL ECHOCARDIOGRAM;  Surgeon: Chrystie Nose, MD;  Location: [facility] INVASIVE CV LAB;  Service: Cardiovascular;  Laterality: N/A;   TENDON LENGTHENING Bilateral    calf   TONSILLECTOMY     Social History   Occupational History   Occupation: DISABLED  Tobacco Use   Smoking status: Every Day    Current packs/day: 1.00    Average packs/day: 1 pack/day for 40.0 years (40.0 ttl pk-yrs)    Types: Cigarettes   Smokeless tobacco: Never  Vaping Use   Vaping status: Never Used  Substance and Sexual Activity   Alcohol use: No   Drug use: No   Sexual activity: Not on file

## 2023-04-14 ENCOUNTER — Emergency Department (HOSPITAL_COMMUNITY)
Admission: EM | Admit: 2023-04-14 | Discharge: 2023-04-14 | Disposition: A | Discharge status: Home / Self Care | Payer: Medicare Other | Attending: Emergency Medicine | Admitting: Emergency Medicine

## 2023-04-14 ENCOUNTER — Other Ambulatory Visit: Payer: Self-pay

## 2023-04-14 ENCOUNTER — Encounter (HOSPITAL_COMMUNITY): Payer: Self-pay

## 2023-04-14 ENCOUNTER — Emergency Department (HOSPITAL_COMMUNITY): Payer: Medicare Other

## 2023-04-14 DIAGNOSIS — S0990XA Unspecified injury of head, initial encounter: Secondary | ICD-10-CM

## 2023-04-14 DIAGNOSIS — E119 Type 2 diabetes mellitus without complications: Secondary | ICD-10-CM | POA: Diagnosis not present

## 2023-04-14 DIAGNOSIS — W050XXA Fall from non-moving wheelchair, initial encounter: Secondary | ICD-10-CM | POA: Diagnosis not present

## 2023-04-14 DIAGNOSIS — Z7984 Long term (current) use of oral hypoglycemic drugs: Secondary | ICD-10-CM | POA: Diagnosis not present

## 2023-04-14 DIAGNOSIS — Z794 Long term (current) use of insulin: Secondary | ICD-10-CM | POA: Insufficient documentation

## 2023-04-14 DIAGNOSIS — Z7901 Long term (current) use of anticoagulants: Secondary | ICD-10-CM | POA: Diagnosis not present

## 2023-04-14 DIAGNOSIS — I10 Essential (primary) hypertension: Secondary | ICD-10-CM | POA: Insufficient documentation

## 2023-04-14 DIAGNOSIS — Z79899 Other long term (current) drug therapy: Secondary | ICD-10-CM | POA: Diagnosis not present

## 2023-04-14 DIAGNOSIS — S060X0A Concussion without loss of consciousness, initial encounter: Secondary | ICD-10-CM | POA: Diagnosis not present

## 2023-04-14 DIAGNOSIS — W19XXXA Unspecified fall, initial encounter: Secondary | ICD-10-CM

## 2023-04-14 NOTE — Progress Notes (Signed)
Orthopedic Tech Progress Note Patient Details:  Troy [name] [birth year] [id number]  Level II trauma, no ortho tech needs identified at this time.  Patient ID: Troy [name], male   DOB: [birth year], 62 y.o.   MRN: [id number]  Docia Furl 04/14/2023, 12:00 PM

## 2023-04-14 NOTE — Discharge Instructions (Signed)
You were seen for your head injury in the emergency department. It is likely that you had a concussion.  At home, please use tylenol and ibuprofen for any headaches that you may have.   Return gradually to work and school over the next week. Take breaks if you are having headaches or trouble thinking clearly.   Follow-up with your primary doctor or concussion clinic in 1 week regarding your visit.  DO NOT return to playing sports or activities where you could sustain additional head trauma until cleared to do so by a healthcare provider.   Return immediately to the emergency department if you experience any of the following: severe headache, persistent vomiting, or any other concerning symptoms.    Thank you for visiting our Emergency Department. It was a pleasure taking care of you today.

## 2023-04-14 NOTE — ED Triage Notes (Signed)
Pt from [facility] with reports of fall. PT fell backwards out of the chair and hit the back on his head. No LOC. Pt taking Eliquis.

## 2023-04-14 NOTE — ED Provider Notes (Signed)
[facility] [facility] AT [facility] Provider Note   CSN: [id number] Arrival date & time: 04/14/23  1035     History  Chief Complaint  Patient presents with   Marletta Lor    Troy [name] is a 62 y.o. male.  62 year old male with a history of atrial flutter on Eliquis, PAD, liver cirrhosis, hypertension, hyperlipidemia, and diabetes who presents emergency department after a fall.  Patient was eating breakfast when he pushed off with his wheel trailer and fell backwards out of the wheelchair hitting his head.  No loss of consciousness.  No preceding symptoms.  Lives at [facility] and they called 911 to have him come into the emergency department.  Took his Eliquis this morning but is unsure what time.  Denies any additional trauma as a result of the fall.  No vomiting afterwards.  No neck pain.       Home Medications Prior to Admission medications   Medication Sig Start Date End Date Taking? Authorizing Provider  amiodarone (PACERONE) 200 MG tablet Take 1 tablet (200 mg total) by mouth daily. 04/05/23  Yes Osvaldo Shipper, MD  apixaban (ELIQUIS) 5 MG TABS tablet Take 1 tablet (5 mg total) by mouth 2 (two) times daily. 02/16/23 05/17/23 Yes [place], Eric J, DO  empagliflozin ([name]) 10 MG TABS tablet Take 1 tablet (10 mg total) by mouth daily before breakfast. 02/23/23 08/22/23 Yes Azalee Course, PA  furosemide (LASIX) 40 MG tablet Take 1 tablet (40 mg total) by mouth daily. 04/05/23  Yes Osvaldo Shipper, MD  glipiZIDE 2.5 MG TABS Take 2.5 mg by mouth 2 (two) times daily before a meal. 04/08/23  Yes Dahal, Melina Schools, MD  insulin degludec (TRESIBA) 100 UNIT/ML FlexTouch Pen Inject 10 Units into the skin daily.   Yes [provider]  insulin lispro (HUMALOG) 100 UNIT/ML injection Inject 0-12 Units into the skin See admin instructions. Administer 0-12 units before meals and at bedtime per sliding scale:       < 70 : contact NP/PA   71-200 : 0 units 201-250 : 2  units 251-300 : 4 units 301-350 : 6 units 351-400 : 8 units 401-450 : 10 units 451-600 : 12 units     > 450 : 12 units, recheck in 2 hours. If > 350 or < 100 notify provider   Yes [provider]  nicotine (NICODERM CQ - DOSED IN MG/24 HOURS) 14 mg/24hr patch Place 14 mg onto the skin daily.   Yes [provider]  oxyCODONE-acetaminophen (PERCOCET) 10-325 MG tablet Take 1 tablet by mouth every 4 (four) hours as needed for pain. 04/05/23  Yes Osvaldo Shipper, MD  pantoprazole (PROTONIX) 40 MG tablet Take 1 tablet (40 mg total) by mouth daily. 02/17/23 05/18/23 Yes [place], Eric J, DO  rosuvastatin (CRESTOR) 40 MG tablet Take 1 tablet (40 mg total) by mouth daily. 03/23/23  Yes Vann, Jessica U, DO  sertraline (ZOLOFT) 50 MG tablet Take 50 mg by mouth daily.   Yes [provider]  Continuous Glucose Receiver (FREESTYLE LIBRE 3 READER) DEVI Use as directed with lifestyle libre sensors 02/16/23   [place], Eric J, DO  Continuous Glucose Sensor (FREESTYLE LIBRE 3 SENSOR) MISC Place 1 sensor on the skin every 14 days. Use to check glucose continuously 02/16/23   [place], Alvira Philips, DO  Insulin Pen Needle (PEN NEEDLES 3/16") 31G X 5 MM MISC Use as directed with insulin pen 02/16/23   [place], Alvira Philips, DO  Allergies    Hydromorphone hcl er, Nucynta [tapentadol], and Exalamide    Review of Systems   Review of Systems  Physical Exam Updated Vital Signs BP (!) 154/63   Pulse (!) 57   Temp 97.8 F (36.6 C) (Oral)   Resp 18   Ht 5\' 11"  (1.803 m)   Wt 72.6 kg   SpO2 100%   BMI 22.32 kg/m  Physical Exam Vitals and nursing note reviewed.  Constitutional:      General: He is not in acute distress.    Appearance: Normal appearance. He is well-developed. He is not ill-appearing.     Comments: Alert and oriented x 2.  Cannot recall the year.  HENT:     Head: Normocephalic and atraumatic.     Right Ear: External ear normal.     Left Ear: External ear normal.     Nose: Nose  normal.     Mouth/Throat:     Mouth: Mucous membranes are moist.     Pharynx: Oropharynx is clear.  Eyes:     Extraocular Movements: Extraocular movements intact.     Conjunctiva/sclera: Conjunctivae normal.     Pupils: Pupils are equal, round, and reactive to light.  Neck:     Comments: No C-spine midline tenderness to palpation Cardiovascular:     Rate and Rhythm: Normal rate and regular rhythm.     Pulses: Normal pulses.     Heart sounds: Normal heart sounds.  Pulmonary:     Effort: Pulmonary effort is normal. No respiratory distress.     Breath sounds: Normal breath sounds.  Abdominal:     General: Abdomen is flat. There is no distension.     Palpations: Abdomen is soft. There is no mass.     Tenderness: There is no abdominal tenderness. There is no guarding.  Musculoskeletal:        General: No deformity. Normal range of motion.     Cervical back: Normal range of motion and neck supple. No rigidity or tenderness.     Right lower leg: No edema.     Left lower leg: No edema.     Comments: No tenderness to palpation of midline thoracic or lumbar spine.  No step-offs palpated.  No tenderness to palpation of chest wall.  No bruising noted.  No tenderness to palpation of bilateral clavicles.  No tenderness to palpation, bruising, or deformities noted of bilateral shoulders, elbows, wrists, hips.  No tenderness palpation of right knee.  Left AKA and right BKA.  Skin:    General: Skin is warm and dry.  Neurological:     General: No focal deficit present.     Mental Status: He is alert.     Cranial Nerves: No cranial nerve deficit.     Sensory: No sensory deficit.     Motor: No weakness.  Psychiatric:        Mood and Affect: Mood normal.        Behavior: Behavior normal.     ED Results / Procedures / Treatments   Labs (all labs ordered are listed, but only abnormal results are displayed) Labs Reviewed - No data to display  EKG None  Radiology CT Head Wo  Contrast  Result Date: 04/14/2023 CLINICAL DATA:  fall on thinners EXAM: CT HEAD WITHOUT CONTRAST TECHNIQUE: Contiguous axial images were obtained from the base of the skull through the vertex without intravenous contrast. RADIATION DOSE REDUCTION: This exam was performed according to the departmental dose-optimization program which includes  automated exposure control, adjustment of the mA and/or kV according to patient size and/or use of iterative reconstruction technique. COMPARISON:  CT head 04/07/23 FINDINGS: Brain: No hemorrhage. No hydrocephalus. No extra-axial fluid collection. No mass effect. No mass lesion. 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 Unchanged cystic lesion in the posterior right frontal lobe. Vascular: No hyperdense vessel or unexpected calcification. Skull: Normal. Negative for fracture or focal lesion. Sinuses/Orbits: Middle ear or mastoid effusion. Paranasal sinuses are clear. Orbits are unremarkable. Other: None. IMPRESSION: 1. No acute intracranial abnormality. 2. 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 Electronically Signed   By: Lorenza Cambridge M.D.   On: 04/14/2023 11:47    Procedures Procedures    Medications Ordered in ED Medications - No data to display  ED Course/ Medical Decision Making/ A&P Clinical Course as of 04/14/23 2142  Thu Apr 14, 2023  1123 Spoke to staff. Had unwitnessed fall from his WC. Did hit head. Found on L side but no pain. Has been otherwise doing well no confusion or recent illnesses. After the fall couldn't get the year correct. Normally AAOx3.  [RP]    Clinical Course User Index [RP] Rondel Baton, MD                                 Medical Decision Making Amount and/or Complexity of Data Reviewed Radiology: ordered.   Troy [name] is a 62 y.o. male with comorbidities that complicate the patient evaluation including atrial flutter on Eliquis, PAD, liver cirrhosis, hypertension, hyperlipidemia,  and diabetes who presents emergency department after a fall.     Initial Ddx:  TBI, concussion, C-spine injury, encephalopathy  MDM/Course:  Patient presents emergency department after a fall.  Was somewhat confused for staff and cannot recall the year on evaluation to the emergency department but otherwise is neuro intact and responds appropriately to questions.  Is on blood thinners so was a trauma activation.  No C-spine tenderness to palpation.  No other injuries.  This appeared to be a mechanical fall.  Had a head CT that was WNL.  Discussed with his facility who reports no recent infusion set for immediately after the fall.  Feel he likely had a concussion.  Upon reevaluation is back to baseline.  Discharged to his facility.  This patient presents to the ED for concern of complaints listed in HPI, this involves an extensive number of treatment options, and is a complaint that carries with it a high risk of complications and morbidity. Disposition including potential need for admission considered.   Dispo: DC Home. Return precautions discussed including, but not limited to, those listed in the AVS. Allowed pt time to ask questions which were answered fully prior to dc.  Additional history obtained from Nursing Home/Care Facility Records reviewed Outpatient Clinic Notes I independently reviewed the following imaging with scope of interpretation limited to determining acute life threatening conditions related to emergency care: CT Head and agree with the radiologist interpretation with the following exceptions: none I have reviewed the patients home medications and made adjustments as needed Social Determinants of health:  SNF resident  Portions of this note were generated with Scientist, clinical (histocompatibility and immunogenetics). Dictation errors may occur despite best attempts at proofreading.           Final Clinical Impression(s) / ED Diagnoses Final diagnoses:  Fall, initial encounter  Minor head  injury, initial encounter  Concussion without loss of  consciousness, initial encounter    Rx / DC Orders ED Discharge Orders     None         Rondel Baton, MD 04/14/23 2142

## 2023-04-14 NOTE — ED Notes (Signed)
ct 

## 2023-04-18 ENCOUNTER — Ambulatory Visit: Payer: Medicare Other | Admitting: Orthopedic Surgery

## 2023-04-26 NOTE — Progress Notes (Unsigned)
  Electrophysiology Office Note:   Date:  04/27/2023  ID:  Troy [name], DOB 27-Oct-1960, MRN [id number]  Primary Cardiologist: None Electrophysiologist: None      History of Present Illness:   Troy [name] is a 62 y.o. 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    Admitted 10/12 - 10/18 for R BKA s/p   Since discharge from hospital the patient reports doing well from a cardiac perspective, has not noted any breakthrough fib/flutter.  he denies chest pain, palpitations, dyspnea, PND, orthopnea, nausea, vomiting, dizziness, syncope, edema, weight gain, or early satiety.   Review of systems complete and found to be negative unless listed in HPI.   EP Information / Studies Reviewed:    EKG is ordered today. Personal review as below.        TEE/DCC 9/26 to [facility].  Echo showed:  LVEF 70-75%, normal RV, Mild LAE, normal RA, Trivial MR, Trivial TR.   Echo 10/17 - LVEF 70-75%, Grade 2 DD, normal low, Mod LAE  Physical Exam:   VS:  BP (!) 114/58 (BP Location: Left Arm, Patient Position: Sitting, Cuff Size: Normal)   Pulse (!) 59   Ht 6' (1.829 m)   Wt 175 lb (79.4 kg)   SpO2 98%   BMI 23.73 kg/m    Wt Readings from Last 3 Encounters:  04/27/23 175 lb (79.4 kg)  04/14/23 160 lb (72.6 kg)  04/06/23 175 lb 0.7 oz (79.4 kg)     GEN: Well nourished, well developed in no acute distress NECK: No JVD; No carotid bruits CARDIAC: Regular rate and rhythm, no murmurs, rubs, gallops RESPIRATORY:  Clear to auscultation without rales, wheezing or rhonchi  ABDOMEN: Soft, non-tender, non-distended EXTREMITIES:  No edema; No deformity   ASSESSMENT AND PLAN:    Typical atrial flutter Possible Atrial fibrillation (?) Recurrent after TEE/DCC recent admission, started on IV amidarone.  Ultimately, will likely benefit from ablation With recent  amputation, will continue follow up as outpatient to discuss ablation once he has healed.  Continue Eliquis for CHA2DS2/VASc of at least 3. Continue amiodarone 200 mg daily for now Surveillance labs today.   Will need to fully review strips to make sure no fib, but by records had both.   Will need stump and wound to heal prior to consideration of advanced EP procedures.   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  ABx per primary.   HFpEF Volume status stable. EF normal to hyperdynamic as above.    DM2 Per primary.     Follow up with EP APP  in 6 weeks to further discuss and consider ablation.    Signed, Graciella Freer, PA-C

## 2023-04-27 ENCOUNTER — Ambulatory Visit: Payer: Medicare Other | Attending: Student | Admitting: Student

## 2023-04-27 ENCOUNTER — Encounter: Payer: Self-pay | Admitting: Student

## 2023-04-27 VITALS — BP 114/58 | HR 59 | Ht 72.0 in | Wt 175.0 lb

## 2023-04-27 DIAGNOSIS — I4892 Unspecified atrial flutter: Secondary | ICD-10-CM

## 2023-04-27 DIAGNOSIS — I10 Essential (primary) hypertension: Secondary | ICD-10-CM | POA: Diagnosis not present

## 2023-04-27 DIAGNOSIS — I5032 Chronic diastolic (congestive) heart failure: Secondary | ICD-10-CM | POA: Diagnosis not present

## 2023-04-27 DIAGNOSIS — I739 Peripheral vascular disease, unspecified: Secondary | ICD-10-CM

## 2023-04-27 NOTE — Patient Instructions (Signed)
Medication Instructions:  Your physician recommends that you continue on your current medications as directed. Please refer to the Current Medication list given to you today.  *If you need a refill on your cardiac medications before your next appointment, please call your pharmacy*  Lab Work: CMET, TSH, FreeT4-TODAY If you have labs (blood work) drawn today and your tests are completely normal, you will receive your results only by: MyChart Message (if you have MyChart) OR A paper copy in the mail If you have any lab test that is abnormal or we need to change your treatment, we will call you to review the results.   Follow-Up: At [facility], you and your health needs are our priority.  As part of our continuing mission to provide you with exceptional heart care, we have created designated Provider Care Teams.  These Care Teams include your primary Cardiologist (physician) and Advanced Practice Providers (APPs -  Physician Assistants and Nurse Practitioners) who all work together to provide you with the care you need, when you need it.   Your next appointment:   6 week(s)  Provider:   Casimiro Needle "Otilio Saber, PA-C

## 2023-04-28 LAB — COMPREHENSIVE METABOLIC PANEL
ALT: 23 [IU]/L (ref 0–44)
AST: 21 [IU]/L (ref 0–40)
Albumin: 3.9 g/dL (ref 3.9–4.9)
Alkaline Phosphatase: 113 [IU]/L (ref 44–121)
BUN/Creatinine Ratio: 34 — ABNORMAL HIGH (ref 10–24)
BUN: 24 mg/dL (ref 8–27)
Bilirubin Total: <0.2 mg/dL (ref 0.0–1.2)
CO2: 26 mmol/L (ref 20–29)
Calcium: 9.4 mg/dL (ref 8.6–10.2)
Chloride: 103 mmol/L (ref 96–106)
Creatinine, Ser: 0.7 mg/dL — ABNORMAL LOW (ref 0.76–1.27)
Globulin, Total: 3.1 g/dL (ref 1.5–4.5)
Glucose: 139 mg/dL — ABNORMAL HIGH (ref 70–99)
Potassium: 4.3 mmol/L (ref 3.5–5.2)
Sodium: 142 mmol/L (ref 134–144)
Total Protein: 7 g/dL (ref 6.0–8.5)
eGFR: 104 mL/min/{1.73_m2} (ref >59)

## 2023-04-28 LAB — TSH: TSH: 8.15 u[IU]/mL — ABNORMAL HIGH (ref 0.450–4.500)

## 2023-04-28 LAB — T4, FREE: Free T4: 0.97 ng/dL (ref 0.82–1.77)

## 2023-05-02 ENCOUNTER — Ambulatory Visit (INDEPENDENT_AMBULATORY_CARE_PROVIDER_SITE_OTHER): Payer: Medicare Other | Admitting: Orthopedic Surgery

## 2023-05-02 DIAGNOSIS — T8781 Dehiscence of amputation stump: Secondary | ICD-10-CM

## 2023-05-02 DIAGNOSIS — Z89511 Acquired absence of right leg below knee: Secondary | ICD-10-CM

## 2023-05-03 ENCOUNTER — Encounter: Payer: Self-pay | Admitting: Orthopedic Surgery

## 2023-05-03 NOTE — Progress Notes (Signed)
Office Visit Note   Patient: Troy [name]           Date of Birth: 25-Jul-1960           MRN: [id number] Visit Date: 05/02/2023              Requested by: Rebecka Apley, NP [address] [place],  [place] [address] PCP: Rebecka Apley, NP  Chief Complaint  Patient presents with   Right Leg - Routine Post Op    03/11/23 right BKA      HPI: Patient is a 62 year old gentleman status post right below-knee amputation September 20.  Assessment & Plan: Visit Diagnoses:  1. Dehiscence of amputation stump (HCC)   2. Right below-knee amputee (HCC)     Plan: 2 ulcers were debrided back to bleeding viable healthy tissue.  Patient will continue with compression and dressing changes.  Follow-Up Instructions: Return in about 4 weeks (around 05/30/2023).   Ortho Exam  Patient is alert, oriented, no adenopathy, well-dressed, normal affect, normal respiratory effort. Examination laterally there were 2 ulcerative areas these were debrided back to bleeding viable granulation tissue this was touched with silver nitrate.  The ulcers are 1 cm diameter there is no cellulitis no drainage.  Imaging: No results found. No images are attached to the encounter.  Labs: Lab Results  Component Value Date   HGBA1C 11.7 (H) 02/05/2023   HGBA1C 10.8 (H) 08/18/2021   HGBA1C 12.3 (H) 01/21/2021   ESRSEDRATE 110 (H) 02/13/2023   ESRSEDRATE 56 (H) 02/07/2023   CRP 13.0 (H) 02/07/2023   CRP 22.7 (H) 08/20/2021   CRP 23.2 (H) 08/20/2021   REPTSTATUS 04/07/2023 FINAL 04/02/2023   GRAMSTAIN  03/11/2023    ABUNDANT WBC PRESENT, PREDOMINANTLY PMN RARE GRAM POSITIVE COCCI IN PAIRS    CULT  04/02/2023    NO GROWTH 5 DAYS Performed at [facility], [address] [place], [place] [address]    LABORGA METHICILLIN RESISTANT STAPHYLOCOCCUS AUREUS 03/11/2023     Lab Results  Component Value Date   ALBUMIN 3.9 04/27/2023   ALBUMIN 2.2 (L) 03/23/2023   ALBUMIN 1.9 (L)  03/18/2023   PREALBUMIN 5 (L) 03/11/2023    Lab Results  Component Value Date   MG 1.7 03/18/2023   MG 1.6 (L) 02/13/2023   MG 1.8 02/12/2023   No results found for: "VD25OH"  Lab Results  Component Value Date   PREALBUMIN 5 (L) 03/11/2023      Latest Ref Rng & Units 04/08/2023    7:47 AM 04/07/2023    5:23 AM 04/04/2023    3:07 AM  CBC EXTENDED  WBC 4.0 - 10.5 K/uL 11.2  13.0  16.3   RBC 4.22 - 5.81 MIL/uL 4.58  4.46  4.34   Hemoglobin 13.0 - 17.0 g/dL 84.6  96.2  95.2   HCT 39.0 - 52.0 % 35.2  35.3  34.6   Platelets 150 - 400 K/uL 385  399  404   NEUT# 1.7 - 7.7 K/uL 6.8  8.5    Lymph# 0.7 - 4.0 K/uL 3.0  3.0       There is no height or weight on file to calculate BMI.  Orders:  No orders of the defined types were placed in this encounter.  No orders of the defined types were placed in this encounter.    Procedures: No procedures performed  Clinical Data: No additional findings.  ROS:  All other systems negative, except  as noted in the HPI. Review of Systems  Objective: Vital Signs: There were no vitals taken for this visit.  Specialty Comments:  No specialty comments available.  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   (HFpEF) heart failure with preserved ejection fraction (HCC) 04/02/2023   Lactic acidosis 04/02/2023   Acute respiratory failure with hypoxia (HCC) 04/02/2023   Leukocytosis 04/02/2023   Uncontrolled type 2 diabetes mellitus with hyperglycemia, with long-term current use of insulin (HCC) 04/02/2023   Hypochromic anemia 04/02/2023   PVD (peripheral vascular disease) (HCC) 04/02/2023   Thrombocytosis 04/02/2023   Respiratory distress 03/21/2023   Osteomyelitis of ankle or foot, acute, right (HCC) 03/11/2023   Abscess of right lower leg 03/11/2023   Below-knee amputation of right lower extremity (HCC) 03/11/2023   Chronic respiratory failure  with hypoxia (HCC) 02/12/2023   Pulmonary infiltrates 02/12/2023   Acute on chronic heart failure with preserved ejection fraction (HFpEF) (HCC) 02/12/2023   Chronic osteomyelitis of right foot with draining sinus (HCC) 02/07/2023   Paroxysmal atrial flutter (HCC) 02/05/2023   Hypotension 02/05/2023   Atrial fibrillation with RVR (HCC) 02/04/2023   Atrial flutter (HCC) 02/04/2023   Lower limb ischemia: ??? 08/22/2021   Diabetic acidosis without coma (HCC)    Hypokalemia    Hypomagnesemia    Hyponatremia    Multifocal pneumonia    Parapneumonic effusion 08/18/2021   Bowel Ileus (HCC) 08/18/2021   ?? 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   Ketoacidosis due to type 2 diabetes mellitus (HCC) 08/15/2021   COVID-19 virus infection 08/15/2021   S/P AKA (above knee amputation), left (HCC) 02/12/2021   Amputation of right great toe and 2nd Toe 02/12/2021   Sepsis due to left-sided neck cellulitis/MRSA 02/12/2021   Hypotensive episode    Elevated troponin    SIRS (systemic inflammatory response syndrome) (HCC) 02/11/2021   Abscess of bursa of left ankle 01/21/2021   Dehiscence of amputation stump (HCC)    Ischemia of left BKA site (HCC)    Abscess of leg without foot, left    Osteomyelitis of second toe of right foot (HCC) 07/12/2019   Below knee amputation status, left 01/25/2018   Wound dehiscence, surgical    Status post left foot surgery 12/15/2017   GERD (gastroesophageal reflux disease) 09/26/2017   Hyperlipidemia associated with type 2 diabetes mellitus (HCC) 07/08/2017   Snoring 06/07/2016   Chest tube in place 01/22/2016   Abnormal nuclear stress test 01/22/2016   Pain, chronic due to trauma 07/04/2012   Complex regional pain syndrome I of lower limb 07/04/2012   COPD (chronic obstructive pulmonary disease) (HCC) 01/27/2011   Pre-operative cardiovascular examination 01/27/2011   Nonspecific abnormal electrocardiogram (ECG) (EKG) 01/27/2011   Murmur 01/27/2011   DM2  (diabetes mellitus, type 2) (HCC)    HTN (hypertension)    HLD (hyperlipidemia)    Tobacco abuse    Past Medical History:  Diagnosis Date   Acute renal failure (HCC)    in setting of NSAID use and orthopedic surgery 2010   Anxiety and depression    Chronic diastolic CHF (congestive heart failure) (HCC)    a. Echo 6/17: severe conc LVH, vigorous EF, EF 65-70%, no dynamic obstruction, no RWMA, Gr 1 DD, mild TR  //  b. LHC 8/17: no sig CAD, LVEDP 28   Cirrhosis of liver (HCC)    COPD (chronic obstructive pulmonary disease) (HCC)    Diabetic  ulcer of left foot (HCC)    DM2 (diabetes mellitus, type 2) (HCC)    Dysrhythmia    Family history of early CAD    GERD (gastroesophageal reflux disease)    History of amputation of foot (HCC)    L trans-met // R toe   History of cardiac catheterization    a. LHC 2002: irregs  //  b. LHC in 8/17: no sig CAD, apical DK, hyperdynamic LV, LVEDP 28   History of kidney stones    History of nuclear stress test    a. Nuc 7/17: Overall, intermediate risk nuclear stress test secondary to small size of apical lateral defect and reduced ejection fraction.  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    Kidney calculi    Palpitations    Pneumonia    PTSD (post-traumatic stress disorder)    Tobacco abuse    Transient ischemic attack     Family History  Problem Relation Age of Onset   Leukemia Mother 69       died   Lung cancer Father 41       died   Heart attack Brother 29   Heart attack Brother 65   Hypertension Brother        X3   Hypertension Sister        X2   Diabetes Sister    Stroke Sister    Diabetes Sister    Other Brother        Set designer accident    Past Surgical History:  Procedure Laterality Date   ABDOMINAL AORTOGRAM W/LOWER EXTREMITY N/A 01/31/2023   Procedure: ABDOMINAL AORTOGRAM W/LOWER EXTREMITY;  Surgeon: Maeola Harman, MD;  Location: [facility] INVASIVE CV LAB;  Service: Cardiovascular;  Laterality: N/A;   AMPUTATION Left 01/03/2018   Procedure: LEFT MIDFOOT AMPUTATION/REVISION MIDAMPUTAION;  Surgeon: Kathryne Hitch, MD;  Location: [facility] OR;  Service: Orthopedics;  Laterality: Left;   AMPUTATION Left 01/25/2018   Procedure: LEFT BELOW KNEE AMPUTATION;  Surgeon: Nadara Mustard, MD;  Location: [facility] OR;  Service: Orthopedics;  Laterality: Left;   AMPUTATION Left 01/21/2021   Procedure: LEFT ABOVE KNEE AMPUTATION;  Surgeon: Nadara Mustard, MD;  Location: [facility] OR;  Service: Orthopedics;  Laterality: Left;   AMPUTATION Right 02/09/2023   Procedure: RIGHT TRANSMETATARSAL AMPUTATION;  Surgeon: Nadara Mustard, MD;  Location: [facility] OR;  Service: Orthopedics;  Laterality: Right;   AMPUTATION Right 03/11/2023   Procedure: RIGHT BELOW KNEE AMPUTATION;  Surgeon: Nadara Mustard, MD;  Location: [facility];  Service: Orthopedics;  Laterality: Right;   AMPUTATION TOE Right 07/17/2019   Procedure: AMPUTATION RIGHT FOOT 2ND TOE;  Surgeon: Kathryne Hitch, MD;  Location: [facility];  Service: Orthopedics;  Laterality: Right;   APPLICATION OF WOUND VAC Left 01/21/2021   Procedure: APPLICATION OF WOUND VAC;  Surgeon: Nadara Mustard, MD;  Location: [facility] OR;  Service: Orthopedics;  Laterality: Left;   BELOW KNEE LEG AMPUTATION Left 01/25/2018   CARDIAC CATHETERIZATION N/A 01/22/2016   Procedure: Left Heart Cath and Coronary Angiography;  Surgeon: Peter M Swaziland, MD;  Location: [facility] INVASIVE CV LAB;  Service: Cardiovascular;  Laterality: N/A;   CARDIOVERSION N/A 03/17/2023   Procedure: CARDIOVERSION;  Surgeon: Chrystie Nose, MD;  Location: [facility] INVASIVE CV LAB;  Service: Cardiovascular;  Laterality: N/A;  FOOT AMPUTATION Bilateral    I & D EXTREMITY Left 12/15/2017   Procedure: IRRIGATION AND DEBRIDEMENT LEFT FOOT ULCER;  Surgeon: Kathryne Hitch, MD;  Location: [facility] ORS;  Service: Orthopedics;  Laterality: Left;    I & D EXTREMITY Left 07/25/2020   Procedure: LEFT BELOW KNEE AMPUTATION ABSCESS EXCISION AND SKIN GRAFT;  Surgeon: Nadara Mustard, MD;  Location: [facility] OR;  Service: Orthopedics;  Laterality: Left;   I & D EXTREMITY Left 08/22/2020   Procedure: DEBRIDEMENT LEFT BELOW KNEE AMPUTATION AND APPLY KERECIS SKIN GRAFT;  Surgeon: Nadara Mustard, MD;  Location: [facility] OR;  Service: Orthopedics;  Laterality: Left;   LITHOTRIPSY     PERIPHERAL VASCULAR INTERVENTION  01/31/2023   Procedure: PERIPHERAL VASCULAR INTERVENTION;  Surgeon: Maeola Harman, MD;  Location: [facility] INVASIVE CV LAB;  Service: Cardiovascular;;   TEE WITHOUT CARDIOVERSION N/A 03/17/2023   Procedure: TRANSESOPHAGEAL ECHOCARDIOGRAM;  Surgeon: Chrystie Nose, MD;  Location: [facility] INVASIVE CV LAB;  Service: Cardiovascular;  Laterality: N/A;   TENDON LENGTHENING Bilateral    calf   TONSILLECTOMY     Social History   Occupational History   Occupation: DISABLED  Tobacco Use   Smoking status: Every Day    Current packs/day: 1.00    Average packs/day: 1 pack/day for 40.0 years (40.0 ttl pk-yrs)    Types: Cigarettes   Smokeless tobacco: Never  Vaping Use   Vaping status: Never Used  Substance and Sexual Activity   Alcohol use: No   Drug use: No   Sexual activity: Not on file
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
## 2023-05-10 ENCOUNTER — Ambulatory Visit: Payer: Medicare Other | Admitting: Adult Health

## 2023-05-10 ENCOUNTER — Encounter: Payer: Self-pay | Admitting: Adult Health

## 2023-05-10 VITALS — BP 98/68 | HR 71

## 2023-05-10 DIAGNOSIS — Z87891 Personal history of nicotine dependence: Secondary | ICD-10-CM | POA: Diagnosis not present

## 2023-05-10 DIAGNOSIS — I4892 Unspecified atrial flutter: Secondary | ICD-10-CM | POA: Diagnosis not present

## 2023-05-10 DIAGNOSIS — G459 Transient cerebral ischemic attack, unspecified: Secondary | ICD-10-CM | POA: Diagnosis not present

## 2023-05-10 NOTE — Patient Instructions (Addendum)
Continue working with therapies at [facility] Eliquis 5mg  twice daily and Crestor 40mg  daily  for secondary stroke prevention  Continue to follow with cardiology for atrial fibrillation and Eliquis management   Continue to follow up with PCP regarding blood pressure, cholesterol and diabetes management  Maintain strict control of hypertension with blood pressure goal below 130/90, diabetes with hemoglobin A1c goal below 7.0 % and cholesterol with LDL cholesterol (bad cholesterol) goal below 70 mg/dL.   Signs of a Stroke? Follow the BEFAST method:  Balance Watch for a sudden loss of balance, trouble with coordination or vertigo Eyes Is there a sudden loss of vision in one or both eyes? Or double vision?  Face: Ask the person to smile. Does one side of the face droop or is it numb?  Arms: Ask the person to raise both arms. Does one arm drift downward? Is there weakness or numbness of a leg? Speech: Ask the person to repeat a simple phrase. Does the speech sound slurred/strange? Is the person confused ? Time: If you observe any of these signs, call 911.        Thank you for coming to see [name] at [facility]. I hope we have been able to provide you high quality care today.  You may receive a patient satisfaction survey over the next few weeks. We would appreciate your feedback and comments so that we may continue to improve ourselves and the health of our patients.

## 2023-05-12 NOTE — Progress Notes (Signed)
I agree with the above plan 

## 2023-05-23 ENCOUNTER — Ambulatory Visit (INDEPENDENT_AMBULATORY_CARE_PROVIDER_SITE_OTHER): Payer: Medicare Other | Admitting: Orthopedic Surgery

## 2023-05-23 DIAGNOSIS — Z89511 Acquired absence of right leg below knee: Secondary | ICD-10-CM

## 2023-05-23 DIAGNOSIS — T8781 Dehiscence of amputation stump: Secondary | ICD-10-CM

## 2023-05-24 ENCOUNTER — Encounter: Payer: Self-pay | Admitting: Orthopedic Surgery

## 2023-05-24 NOTE — Progress Notes (Signed)
Office Visit Note   Patient: Troy [name]           Date of Birth: 1960/07/26           MRN: [id number] Visit Date: 05/23/2023              Requested by: Rebecka Apley, NP [address] [place],  [place] [address] PCP: Rebecka Apley, NP  Chief Complaint  Patient presents with   Right Leg - Routine Post Op    03/11/2023 right BKA       HPI: Patient is a 62 year old gentleman who is 2 months status post right below-knee amputation.  Patient has 2 small areas of wound breakdown laterally.  Patient states that he started having pain this Saturday.  He is currently treating this with Silvadene dressing changes.  Assessment & Plan: Visit Diagnoses:  1. Dehiscence of amputation stump (HCC)   2. 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  A prescription for doxycycline is sent and.  Patient will continue with routine daily dressing changes and follow-up in 2 weeks.  Follow-Up Instructions: Return in about 2 weeks (around 06/06/2023).   Ortho Exam  Patient is alert, oriented, no adenopathy, well-dressed, normal affect, normal respiratory effort. Examination patient has a ulcer laterally that is 1 cm in diameter there is some cellulitis over the lateral aspect there is no purulent drainage there is bleeding.  Most recent hemoglobin A1c is 11.7.  Imaging: No results found. No images are attached to the encounter.  Labs: Lab Results  Component Value Date   HGBA1C 11.7 (H) 02/05/2023   HGBA1C 10.8 (H) 08/18/2021   HGBA1C 12.3 (H) 01/21/2021   ESRSEDRATE 110 (H) 02/13/2023   ESRSEDRATE 56 (H) 02/07/2023   CRP 13.0 (H) 02/07/2023   CRP 22.7 (H) 08/20/2021   CRP 23.2 (H) 08/20/2021   REPTSTATUS 04/07/2023 FINAL 04/02/2023   GRAMSTAIN  03/11/2023    ABUNDANT WBC PRESENT, PREDOMINANTLY PMN RARE GRAM POSITIVE COCCI IN PAIRS    CULT  04/02/2023    NO GROWTH 5 DAYS Performed at [facility], [address], [place], [place] [address]    LABORGA METHICILLIN RESISTANT STAPHYLOCOCCUS AUREUS 03/11/2023     Lab Results  Component Value Date   ALBUMIN 3.9 04/27/2023   ALBUMIN 2.2 (L) 03/23/2023   ALBUMIN 1.9 (L) 03/18/2023   PREALBUMIN 5 (L) 03/11/2023    Lab Results  Component Value Date   MG 1.7 03/18/2023   MG 1.6 (L) 02/13/2023   MG 1.8 02/12/2023   No results found for: "VD25OH"  Lab Results  Component Value Date   PREALBUMIN 5 (L) 03/11/2023      Latest Ref Rng & Units 04/08/2023    7:47 AM 04/07/2023    5:23 AM 04/04/2023    3:07 AM  CBC EXTENDED  WBC 4.0 - 10.5 K/uL 11.2  13.0  16.3   RBC 4.22 - 5.81 MIL/uL 4.58  4.46  4.34   Hemoglobin 13.0 - 17.0 g/dL 46.9  62.9  52.8   HCT 39.0 - 52.0 % 35.2  35.3  34.6   Platelets 150 - 400 K/uL 385  399  404   NEUT# 1.7 - 7.7 K/uL 6.8  8.5    Lymph# 0.7 - 4.0 K/uL 3.0  3.0       There is no height or weight on file to calculate BMI.  Orders:  No orders of the defined types were placed in this encounter.  No orders of the defined types were placed in this encounter.    Procedures: No procedures performed  Clinical Data: No additional findings.  ROS:  All other systems negative, except as noted in the HPI. Review of Systems  Objective: Vital Signs: There were no vitals taken for this visit.  Specialty Comments:  No specialty comments available.  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   (HFpEF) heart failure with preserved ejection fraction (HCC) 04/02/2023   Lactic acidosis 04/02/2023   Acute respiratory failure with hypoxia (HCC) 04/02/2023   Leukocytosis 04/02/2023   Uncontrolled type 2 diabetes mellitus with hyperglycemia, with long-term current use of insulin (HCC) 04/02/2023   Hypochromic anemia 04/02/2023   PVD (peripheral vascular disease) (HCC) 04/02/2023   Thrombocytosis  04/02/2023   Respiratory distress 03/21/2023   Osteomyelitis of ankle or foot, acute, right (HCC) 03/11/2023   Abscess of right lower leg 03/11/2023   Below-knee amputation of right lower extremity (HCC) 03/11/2023   Chronic respiratory failure with hypoxia (HCC) 02/12/2023   Pulmonary infiltrates 02/12/2023   Acute on chronic heart failure with preserved ejection fraction (HFpEF) (HCC) 02/12/2023   Chronic osteomyelitis of right foot with draining sinus (HCC) 02/07/2023   Paroxysmal atrial flutter (HCC) 02/05/2023   Hypotension 02/05/2023   Atrial fibrillation with RVR (HCC) 02/04/2023   Atrial flutter (HCC) 02/04/2023   Lower limb ischemia: ??? 08/22/2021   Diabetic acidosis without coma (HCC)    Hypokalemia    Hypomagnesemia    Hyponatremia    Multifocal pneumonia    Parapneumonic effusion 08/18/2021   Bowel Ileus (HCC) 08/18/2021   ?? 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   Ketoacidosis due to type 2 diabetes mellitus (HCC) 08/15/2021   COVID-19 virus infection 08/15/2021   S/P AKA (above knee amputation), left (HCC) 02/12/2021   Amputation of right great toe and 2nd Toe 02/12/2021   Sepsis due to left-sided neck cellulitis/MRSA 02/12/2021   Hypotensive episode    Elevated troponin    SIRS (systemic inflammatory response syndrome) (HCC) 02/11/2021   Abscess of bursa of left ankle 01/21/2021   Dehiscence of amputation stump (HCC)    Ischemia of left BKA site (HCC)    Abscess of leg without foot, left    Osteomyelitis of second toe of right foot (HCC) 07/12/2019   Below knee amputation status, left 01/25/2018   Wound dehiscence, surgical    Status post left foot surgery 12/15/2017   GERD (gastroesophageal reflux disease) 09/26/2017   Hyperlipidemia associated with type 2 diabetes mellitus (HCC) 07/08/2017   Snoring 06/07/2016   Chest tube in place 01/22/2016   Abnormal nuclear stress test 01/22/2016   Pain, chronic due to trauma 07/04/2012   Complex regional pain  syndrome I of lower limb 07/04/2012   COPD (chronic obstructive pulmonary disease) (HCC) 01/27/2011   Pre-operative cardiovascular examination 01/27/2011   Nonspecific abnormal electrocardiogram (ECG) (EKG) 01/27/2011   Murmur 01/27/2011   DM2 (diabetes mellitus, type 2) (HCC)    HTN (hypertension)    HLD (hyperlipidemia)    Tobacco abuse    Past Medical History:  Diagnosis Date   Acute renal failure (HCC)    in setting of NSAID use and orthopedic surgery 2010   Anxiety and depression    Chronic diastolic CHF (congestive heart failure) (HCC)    a. Echo 6/17: severe  conc LVH, vigorous EF, EF 65-70%, no dynamic obstruction, no RWMA, Gr 1 DD, mild TR  //  b. 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 LHC 2002: irregs  //  b. LHC in 8/17: no sig CAD, apical DK, hyperdynamic LV, LVEDP 28   History of kidney stones    History of nuclear stress test    a. Nuc 7/17: Overall, intermediate risk nuclear stress test secondary to small size of apical lateral defect and reduced ejection fraction.  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    Kidney calculi    Palpitations    Pneumonia    PTSD (post-traumatic stress disorder)    Tobacco abuse    Transient ischemic attack     Family History  Problem Relation Age of Onset   Leukemia Mother 67       died   Lung cancer Father 79       died   Heart attack Brother 54   Heart attack Brother 51   Hypertension Brother        X3   Hypertension Sister        X2   Diabetes Sister    Stroke  Sister    Diabetes Sister    Other Brother        Set designer accident    Past Surgical History:  Procedure Laterality Date   ABDOMINAL AORTOGRAM W/LOWER EXTREMITY N/A 01/31/2023   Procedure: ABDOMINAL AORTOGRAM W/LOWER EXTREMITY;  Surgeon: Maeola Harman, MD;  Location: [facility] INVASIVE CV LAB;  Service: Cardiovascular;  Laterality: N/A;   AMPUTATION Left 01/03/2018   Procedure: LEFT MIDFOOT AMPUTATION/REVISION MIDAMPUTAION;  Surgeon: Kathryne Hitch, MD;  Location[facility] OR;  Service: Orthopedics;  Laterality: Left;   AMPUTATION Left 01/25/2018   Procedure: LEFT BELOW KNEE AMPUTATION;  Surgeon: Nadara Mustard, MD;  Location: [facility] OR;  Service: Orthopedics;  Laterality: Left;   AMPUTATION Left 01/21/2021   Procedure: LEFT ABOVE KNEE AMPUTATION;  Surgeon: Nadara Mustard, MD;  Location: [facility] OR;  Service: Orthopedics;  Laterality: Left;   AMPUTATION Right 02/09/2023   Procedure: RIGHT TRANSMETATARSAL AMPUTATION;  Surgeon: Nadara Mustard, MD;  Location: [facility] OR;  Service: Orthopedics;  Laterality: Right;   AMPUTATION Right 03/11/2023   Procedure: RIGHT BELOW KNEE AMPUTATION;  Surgeon: Nadara Mustard, MD;  Location: [facility] OR;  Service: Orthopedics;  Laterality: Right;   AMPUTATION TOE Right 07/17/2019   Procedure: AMPUTATION RIGHT FOOT 2ND TOE;  Surgeon: Kathryne Hitch, MD;  Location: [facility];  Service: Orthopedics;  Laterality: Right;   APPLICATION OF WOUND VAC Left 01/21/2021   Procedure: APPLICATION OF WOUND VAC;  Surgeon: Nadara Mustard, MD;  Location[facility] OR;  Service: Orthopedics;  Laterality: Left;   BELOW KNEE LEG AMPUTATION Left 01/25/2018   CARDIAC CATHETERIZATION N/A 01/22/2016   Procedure: Left Heart Cath and Coronary  Angiography;  Surgeon: Peter M Swaziland, MD;  Location: [facility] INVASIVE CV LAB;  Service: Cardiovascular;  Laterality: N/A;   CARDIOVERSION N/A 03/17/2023   Procedure: CARDIOVERSION;  Surgeon: Chrystie Nose, MD;  Location: [facility] INVASIVE CV LAB;  Service:  Cardiovascular;  Laterality: N/A;   FOOT AMPUTATION Bilateral    I & D EXTREMITY Left 12/15/2017   Procedure: IRRIGATION AND DEBRIDEMENT LEFT FOOT ULCER;  Surgeon: Kathryne Hitch, MD;  Location: [facility];  Service: Orthopedics;  Laterality: Left;   I & D EXTREMITY Left 07/25/2020   Procedure: LEFT BELOW KNEE AMPUTATION ABSCESS EXCISION AND SKIN GRAFT;  Surgeon: Nadara Mustard, MD;  Location: [facility];  Service: Orthopedics;  Laterality: Left;   I & D EXTREMITY Left 08/22/2020   Procedure: DEBRIDEMENT LEFT BELOW KNEE AMPUTATION AND APPLY KERECIS SKIN GRAFT;  Surgeon: Nadara Mustard, MD;  Location: [facility] OR;  Service: Orthopedics;  Laterality: Left;   LITHOTRIPSY     PERIPHERAL VASCULAR INTERVENTION  01/31/2023   Procedure: PERIPHERAL VASCULAR INTERVENTION;  Surgeon: Maeola Harman, MD;  Location: [facility] INVASIVE CV LAB;  Service: Cardiovascular;;   TEE WITHOUT CARDIOVERSION N/A 03/17/2023   Procedure: TRANSESOPHAGEAL ECHOCARDIOGRAM;  Surgeon: Chrystie Nose, MD;  Location: [facility] INVASIVE CV LAB;  Service: Cardiovascular;  Laterality: N/A;   TENDON LENGTHENING Bilateral    calf   TONSILLECTOMY     Social History   Occupational History   Occupation: DISABLED  Tobacco Use   Smoking status: Every Day    Current packs/day: 1.00    Average packs/day: 1 pack/day for 40.0 years (40.0 ttl pk-yrs)    Types: Cigarettes   Smokeless tobacco: Never  Vaping Use   Vaping status: Never Used  Substance and Sexual Activity   Alcohol use: No   Drug use: No   Sexual activity: Not on file

## 2023-05-30 ENCOUNTER — Ambulatory Visit: Payer: Medicare Other | Admitting: Orthopedic Surgery

## 2023-06-06 ENCOUNTER — Encounter: Payer: Self-pay | Admitting: Orthopedic Surgery

## 2023-06-06 ENCOUNTER — Ambulatory Visit (INDEPENDENT_AMBULATORY_CARE_PROVIDER_SITE_OTHER): Payer: Medicare Other | Admitting: Orthopedic Surgery

## 2023-06-06 DIAGNOSIS — T8781 Dehiscence of amputation stump: Secondary | ICD-10-CM

## 2023-06-06 NOTE — Progress Notes (Signed)
Office Visit Note   Patient: Troy [name]           Date of Birth: 1961/04/17           MRN: [id number] Visit Date: 06/06/2023              Requested by: Rebecka Apley, NP [address] [place],  [place] [address] PCP: Rebecka Apley, NP  Chief Complaint  Patient presents with   Right Leg - Routine Post Op    03/11/2023 right BKA       HPI: Patient is a 62 year old gentleman who is 3 months status post right below-knee amputation.  Patient will complete his doxycycline in 1 week.  Patient is at skilled nursing he is currently using a pain patch and Percocet.  Assessment & Plan: Visit Diagnoses:  1. Dehiscence of amputation stump (HCC)     Plan: A prescription was provided for a left above-knee prosthesis and a right below-knee prosthesis.  Follow-Up Instructions: Return if symptoms worsen or fail to improve.   Ortho Exam  Patient is alert, oriented, no adenopathy, well-dressed, normal affect, normal respiratory effort. Examination of the right residual limb is well-healed and consolidated.  There is no cellulitis no drainage no signs of infection.  Patient is an existing left transfemoral amputee.  Patient's current comorbidities are not expected to impact the ability to function with the prescribed prosthesis. Patient verbally communicates a strong desire to use a prosthesis. Patient currently requires mobility aids to ambulate without a prosthesis.  Expects not to use mobility aids with a new prosthesis.  Patient is a K2 level ambulator that will use a prosthesis to walk around their home and the community over low level environmental barriers.   Patient is a new right transtibial  amputee.  Patient's current comorbidities are not expected to impact the ability to function with the prescribed prosthesis. Patient verbally communicates a strong desire to use a prosthesis. Patient currently requires mobility aids to ambulate without a  prosthesis.  Expects not to use mobility aids with a new prosthesis.  Patient is a K2 level ambulator that will use a prosthesis to walk around their home and the community over low level environmental barriers.       Imaging: No results found.   Labs: Lab Results  Component Value Date   HGBA1C 11.7 (H) 02/05/2023   HGBA1C 10.8 (H) 08/18/2021   HGBA1C 12.3 (H) 01/21/2021   ESRSEDRATE 110 (H) 02/13/2023   ESRSEDRATE 56 (H) 02/07/2023   CRP 13.0 (H) 02/07/2023   CRP 22.7 (H) 08/20/2021   CRP 23.2 (H) 08/20/2021   REPTSTATUS 04/07/2023 FINAL 04/02/2023   GRAMSTAIN  03/11/2023    ABUNDANT WBC PRESENT, PREDOMINANTLY PMN RARE GRAM POSITIVE COCCI IN PAIRS    CULT  04/02/2023    NO GROWTH 5 DAYS Performed at [facility], [address] [place], [place] [address]    LABORGA METHICILLIN RESISTANT STAPHYLOCOCCUS AUREUS 03/11/2023     Lab Results  Component Value Date   ALBUMIN 3.9 04/27/2023   ALBUMIN 2.2 (L) 03/23/2023   ALBUMIN 1.9 (L) 03/18/2023   PREALBUMIN 5 (L) 03/11/2023    Lab Results  Component Value Date   MG 1.7 03/18/2023   MG 1.6 (L) 02/13/2023   MG 1.8 02/12/2023   No results found for: "VD25OH"  Lab Results  Component Value Date   PREALBUMIN 5 (L) 03/11/2023      Latest Ref Rng & Units  04/08/2023    7:47 AM 04/07/2023    5:23 AM 04/04/2023    3:07 AM  CBC EXTENDED  WBC 4.0 - 10.5 K/uL 11.2  13.0  16.3   RBC 4.22 - 5.81 MIL/uL 4.58  4.46  4.34   Hemoglobin 13.0 - 17.0 g/dL 60.4  54.0  98.1   HCT 39.0 - 52.0 % 35.2  35.3  34.6   Platelets 150 - 400 K/uL 385  399  404   NEUT# 1.7 - 7.7 K/uL 6.8  8.5    Lymph# 0.7 - 4.0 K/uL 3.0  3.0       There is no height or weight on file to calculate BMI.  Orders:  No orders of the defined types were placed in this encounter.  No orders of the defined types were placed in this encounter.    Procedures: No procedures performed  Clinical Data: No additional findings.  ROS:  All other  systems negative, except as noted in the HPI. Review of Systems  Objective: Vital Signs: There were no vitals taken for this visit.  Specialty Comments:  No specialty comments available.  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   (HFpEF) heart failure with preserved ejection fraction (HCC) 04/02/2023   Lactic acidosis 04/02/2023   Acute respiratory failure with hypoxia (HCC) 04/02/2023   Leukocytosis 04/02/2023   Uncontrolled type 2 diabetes mellitus with hyperglycemia, with long-term current use of insulin (HCC) 04/02/2023   Hypochromic anemia 04/02/2023   PVD (peripheral vascular disease) (HCC) 04/02/2023   Thrombocytosis 04/02/2023   Respiratory distress 03/21/2023   Osteomyelitis of ankle or foot, acute, right (HCC) 03/11/2023   Abscess of right lower leg 03/11/2023   Below-knee amputation of right lower extremity (HCC) 03/11/2023   Chronic respiratory failure with hypoxia (HCC) 02/12/2023   Pulmonary infiltrates 02/12/2023   Acute on chronic heart failure with preserved ejection fraction (HFpEF) (HCC) 02/12/2023   Chronic osteomyelitis of right foot with draining sinus (HCC) 02/07/2023   Paroxysmal atrial flutter (HCC) 02/05/2023   Hypotension 02/05/2023   Atrial fibrillation with RVR (HCC) 02/04/2023   Atrial flutter (HCC) 02/04/2023   Lower limb ischemia: ??? 08/22/2021   Diabetic acidosis without coma (HCC)    Hypokalemia    Hypomagnesemia    Hyponatremia    Multifocal pneumonia    Parapneumonic effusion 08/18/2021   Bowel Ileus (HCC) 08/18/2021   ?? 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   Ketoacidosis due to type 2 diabetes mellitus (HCC) 08/15/2021   COVID-19 virus infection 08/15/2021   S/P AKA (above knee amputation), left (HCC) 02/12/2021   Amputation of right great toe and 2nd Toe 02/12/2021   Sepsis due to left-sided neck cellulitis/MRSA 02/12/2021   Hypotensive episode     Elevated troponin    SIRS (systemic inflammatory response syndrome) (HCC) 02/11/2021   Abscess of bursa of left ankle 01/21/2021   Dehiscence of amputation stump (HCC)    Ischemia of left BKA site (HCC)    Abscess of leg without foot, left    Osteomyelitis of second toe of right foot (HCC) 07/12/2019   Below knee amputation status, left 01/25/2018   Wound dehiscence, surgical    Status post left foot surgery 12/15/2017   GERD (gastroesophageal reflux disease) 09/26/2017   Hyperlipidemia associated with type 2 diabetes mellitus (HCC) 07/08/2017   Snoring 06/07/2016   Chest tube in place 01/22/2016   Abnormal nuclear stress test  01/22/2016   Pain, chronic due to trauma 07/04/2012   Complex regional pain syndrome I of lower limb 07/04/2012   COPD (chronic obstructive pulmonary disease) (HCC) 01/27/2011   Pre-operative cardiovascular examination 01/27/2011   Nonspecific abnormal electrocardiogram (ECG) (EKG) 01/27/2011   Murmur 01/27/2011   DM2 (diabetes mellitus, type 2) (HCC)    HTN (hypertension)    HLD (hyperlipidemia)    Tobacco abuse    Past Medical History:  Diagnosis Date   Acute renal failure (HCC)    in setting of NSAID use and orthopedic surgery 2010   Anxiety and depression    Chronic diastolic CHF (congestive heart failure) (HCC)    a. Echo 6/17: severe conc LVH, vigorous EF, EF 65-70%, no dynamic obstruction, no RWMA, Gr 1 DD, mild TR  //  b. 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 LHC 2002: irregs  //  b. LHC in 8/17: no sig CAD, apical DK, hyperdynamic LV, LVEDP 28   History of kidney stones    History of nuclear  stress test    a. Nuc 7/17: Overall, intermediate risk nuclear stress test secondary to small size of apical lateral defect and reduced ejection fraction.  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    Kidney calculi    Palpitations    Pneumonia    PTSD (post-traumatic stress disorder)    Tobacco abuse    Transient ischemic attack     Family History  Problem Relation Age of Onset   Leukemia Mother 75       died   Lung cancer Father 72       died   Heart attack Brother 100   Heart attack Brother 56   Hypertension Brother        X3   Hypertension Sister        X2   Diabetes Sister    Stroke Sister    Diabetes Sister    Other Brother        Set designer accident    Past Surgical History:  Procedure Laterality Date   ABDOMINAL AORTOGRAM W/LOWER EXTREMITY N/A 01/31/2023   Procedure: ABDOMINAL AORTOGRAM W/LOWER EXTREMITY;  Surgeon: Maeola Harman, MD;  Location: [facility] INVASIVE CV LAB;  Service: Cardiovascular;  Laterality: N/A;   AMPUTATION Left 01/03/2018   Procedure: LEFT MIDFOOT AMPUTATION/REVISION MIDAMPUTAION;  Surgeon: Kathryne Hitch, MD;  Location: [facility] OR;  Service: Orthopedics;  Laterality: Left;   AMPUTATION Left 01/25/2018   Procedure: LEFT BELOW KNEE AMPUTATION;  Surgeon: Nadara Mustard, MD;  Location: [facility] OR;  Service: Orthopedics;  Laterality: Left;   AMPUTATION Left 01/21/2021   Procedure: LEFT ABOVE KNEE AMPUTATION;  Surgeon: Nadara Mustard, MD;  Location: [facility] OR;  Service: Orthopedics;  Laterality: Left;   AMPUTATION Right 02/09/2023   Procedure: RIGHT TRANSMETATARSAL AMPUTATION;  Surgeon: Nadara Mustard, MD;  Location: [facility] OR;  Service: Orthopedics;  Laterality: Right;   AMPUTATION Right 03/11/2023   Procedure: RIGHT BELOW KNEE AMPUTATION;  Surgeon: Nadara Mustard, MD;  Location: [facility] OR;  Service: Orthopedics;  Laterality: Right;   AMPUTATION TOE Right  07/17/2019   Procedure: AMPUTATION RIGHT FOOT 2ND TOE;  Surgeon: Kathryne Hitch, MD;  Location: [facility];  Service: Orthopedics;  Laterality: Right;   APPLICATION OF WOUND VAC Left 01/21/2021   Procedure: APPLICATION OF WOUND VAC;  Surgeon: Nadara Mustard, MD;  Location: [facility] OR;  Service: Orthopedics;  Laterality: Left;   BELOW KNEE LEG AMPUTATION Left 01/25/2018   CARDIAC CATHETERIZATION N/A 01/22/2016   Procedure: Left Heart Cath and Coronary Angiography;  Surgeon: Peter M Swaziland, MD;  Location: [facility] INVASIVE CV LAB;  Service: Cardiovascular;  Laterality: N/A;   CARDIOVERSION N/A 03/17/2023   Procedure: CARDIOVERSION;  Surgeon: Chrystie Nose, MD;  Location: [facility] INVASIVE CV LAB;  Service: Cardiovascular;  Laterality: N/A;   FOOT AMPUTATION Bilateral    I & D EXTREMITY Left 12/15/2017   Procedure: IRRIGATION AND DEBRIDEMENT LEFT FOOT ULCER;  Surgeon: Kathryne Hitch, MD;  Location: [facility];  Service: Orthopedics;  Laterality: Left;   I & D EXTREMITY Left 07/25/2020   Procedure: LEFT BELOW KNEE AMPUTATION ABSCESS EXCISION AND SKIN GRAFT;  Surgeon: Nadara Mustard, MD;  Location: [facility] OR;  Service: Orthopedics;  Laterality: Left;   I & D EXTREMITY Left 08/22/2020   Procedure: DEBRIDEMENT LEFT BELOW KNEE AMPUTATION AND APPLY KERECIS SKIN GRAFT;  Surgeon: Nadara Mustard, MD;  Location: [facility] OR;  Service: Orthopedics;  Laterality: Left;   LITHOTRIPSY     PERIPHERAL VASCULAR INTERVENTION  01/31/2023   Procedure: PERIPHERAL VASCULAR INTERVENTION;  Surgeon: Maeola Harman, MD;  Location: [facility] INVASIVE CV LAB;  Service: Cardiovascular;;   TEE WITHOUT CARDIOVERSION N/A 03/17/2023   Procedure: TRANSESOPHAGEAL ECHOCARDIOGRAM;  Surgeon: Chrystie Nose, MD;  Location: [facility] INVASIVE CV LAB;  Service: Cardiovascular;  Laterality: N/A;   TENDON LENGTHENING Bilateral    calf   TONSILLECTOMY     Social History   Occupational History   Occupation: DISABLED  Tobacco Use   Smoking  status: Every Day    Current packs/day: 1.00    Average packs/day: 1 pack/day for 40.0 years (40.0 ttl pk-yrs)    Types: Cigarettes   Smokeless tobacco: Never  Vaping Use   Vaping status: Never Used  Substance and Sexual Activity   Alcohol use: No   Drug use: No   Sexual activity: Not on file

## 2023-06-07 NOTE — Progress Notes (Unsigned)
  Electrophysiology Office Note:   Date:  06/08/2023  ID:  Troy [name], DOB 1960/12/18, MRN [id number]  Primary Cardiologist: None Electrophysiologist: Lanier Prude, MD      History of Present Illness:   Troy [name] is a 62 y.o. male with h/o atrial flutter, PAD, type 2 DM, HTN, HLD, L AKA and R BKA seen today for routine electrophysiology followup.   Since last being seen in our clinic the patient reports doing well overall. Denies any breakthrough fib/flutter by symptoms. Is back on ABx for cellulitis of his R stump. Improving per wife, but did have a red spot this morning. Otherwise, he denies chest pain, palpitations, dyspnea, PND, orthopnea, nausea, vomiting, dizziness, syncope, edema, weight gain, or early satiety.   Review of systems complete and found to be negative unless listed in HPI.   EP Information / Studies Reviewed:    EKG is not ordered today. EKG from 04/27/2023 reviewed which showed sinus bradycardia  TEE/DCC 9/26 to NSR.  Echo showed:  LVEF 70-75%, normal RV, Mild LAE, normal RA, Trivial MR, Trivial TR.    Echo 10/17 - LVEF 70-75%, Grade 2 DD, normal low, Mod LAE  Physical Exam:   VS:  BP 110/60   Pulse 70   SpO2 97%    Wt Readings from Last 3 Encounters:  04/27/23 175 lb (79.4 kg)  04/14/23 160 lb (72.6 kg)  04/06/23 175 lb 0.7 oz (79.4 kg)     GEN: Well nourished, well developed in no acute distress NECK: No JVD; No carotid bruits CARDIAC: Regular rate and rhythm, no murmurs, rubs, gallops RESPIRATORY:  Clear to auscultation without rales, wheezing or rhonchi  ABDOMEN: Soft, non-tender, non-distended EXTREMITIES:  No edema; No deformity   ASSESSMENT AND PLAN:    Typical atrial flutter Possible Atrial fibrillation (?) Recurrent after TEE/DCC recent admission, started on IV amidarone.  Ultimately, will likely benefit from ablation With recent amputation, will continue follow up as outpatient to discuss ablation once he has healed.  Continue  Eliquis for CHA2DS2/VASc of at least 3. Continue amiodarone 200 mg daily Surveillance labs updated today.     Will need to fully review strips to make sure no fib, but by records had both.   Will need stump and wound to heal prior to consideration of advanced EP procedures.    PAD R foot osteomyelitis s/p right transmetatarsal wound dehiscence -> subsequent right BKA  Not candidate for advanced EP procedures until he is well healed.  ABx per primary. Pts wife states he is back on ABx for mild cellulitis that is improving   HFpEF Volume status stable. EF normal to hyperdynamic as above.    DM2 Per primary.   Follow up with [name] in 6-8 weeks to discuss the possibility, and feasibility, of ablation.   Signed, Graciella Freer, PA-C

## 2023-06-08 ENCOUNTER — Encounter: Payer: Self-pay | Admitting: Student

## 2023-06-08 ENCOUNTER — Ambulatory Visit: Payer: Medicare Other | Attending: Student | Admitting: Student

## 2023-06-08 VITALS — BP 110/60 | HR 70

## 2023-06-08 DIAGNOSIS — I10 Essential (primary) hypertension: Secondary | ICD-10-CM

## 2023-06-08 DIAGNOSIS — I4892 Unspecified atrial flutter: Secondary | ICD-10-CM | POA: Diagnosis not present

## 2023-06-08 DIAGNOSIS — I5032 Chronic diastolic (congestive) heart failure: Secondary | ICD-10-CM | POA: Diagnosis not present

## 2023-06-08 DIAGNOSIS — I739 Peripheral vascular disease, unspecified: Secondary | ICD-10-CM | POA: Diagnosis not present

## 2023-06-08 DIAGNOSIS — I4891 Unspecified atrial fibrillation: Secondary | ICD-10-CM

## 2023-06-08 LAB — T4, FREE: Free T4: 0.84 ng/dL (ref 0.82–1.77)

## 2023-06-08 LAB — COMPREHENSIVE METABOLIC PANEL
ALT: 17 [IU]/L (ref 0–44)
AST: 23 [IU]/L (ref 0–40)
Albumin: 3.9 g/dL (ref 3.9–4.9)
Alkaline Phosphatase: 88 [IU]/L (ref 44–121)
BUN/Creatinine Ratio: 24 (ref 10–24)
BUN: 21 mg/dL (ref 8–27)
Bilirubin Total: 0.2 mg/dL (ref 0.0–1.2)
CO2: 27 mmol/L (ref 20–29)
Calcium: 9.4 mg/dL (ref 8.6–10.2)
Chloride: 104 mmol/L (ref 96–106)
Creatinine, Ser: 0.89 mg/dL (ref 0.76–1.27)
Globulin, Total: 2.6 g/dL (ref 1.5–4.5)
Glucose: 121 mg/dL — ABNORMAL HIGH (ref 70–99)
Potassium: 4 mmol/L (ref 3.5–5.2)
Sodium: 143 mmol/L (ref 134–144)
Total Protein: 6.5 g/dL (ref 6.0–8.5)
eGFR: 96 mL/min/{1.73_m2} (ref >59)

## 2023-06-08 LAB — TSH: TSH: 7.19 u[IU]/mL — ABNORMAL HIGH (ref 0.450–4.500)

## 2023-06-08 NOTE — Patient Instructions (Signed)
Medication Instructions:  Your physician recommends that you continue on your current medications as directed. Please refer to the Current Medication list given to you today.  *If you need a refill on your cardiac medications before your next appointment, please call your pharmacy*  Lab Work: CMET, TSH, FreeT4-TODAY If you have labs (blood work) drawn today and your tests are completely normal, you will receive your results only by: MyChart Message (if you have MyChart) OR A paper copy in the mail If you have any lab test that is abnormal or we need to change your treatment, we will call you to review the results.  Follow-Up: At [facility], you and your health needs are our priority.  As part of our continuing mission to provide you with exceptional heart care, we have created designated Provider Care Teams.  These Care Teams include your primary Cardiologist (physician) and Advanced Practice Providers (APPs -  Physician Assistants and Nurse Practitioners) who all work together to provide you with the care you need, when you need it.  Your next appointment:   As scheduled

## 2023-07-07 ENCOUNTER — Ambulatory Visit (INDEPENDENT_AMBULATORY_CARE_PROVIDER_SITE_OTHER): Payer: Medicare Other | Admitting: Orthopedic Surgery

## 2023-07-07 DIAGNOSIS — Z89511 Acquired absence of right leg below knee: Secondary | ICD-10-CM

## 2023-07-07 DIAGNOSIS — Z89612 Acquired absence of left leg above knee: Secondary | ICD-10-CM | POA: Diagnosis not present

## 2023-07-15 ENCOUNTER — Encounter: Payer: Self-pay | Admitting: Orthopedic Surgery

## 2023-07-15 NOTE — Progress Notes (Signed)
Office Visit Note   Patient: Troy [name]           Date of Birth: 05-05-1961           MRN: [id number] Visit Date: 07/07/2023              Requested by: Rebecka Apley, NP [address] [place],  [place] [address] PCP: Rebecka Apley, NP  Chief Complaint  Patient presents with   Right Leg - Wound Check    03/11/23 right BKA Patient reports a new wound      HPI: Patient is a 62 year old gentleman who presents status post right below-knee amputation in September and status post left above-knee amputation.  Patient states he has developed a new wound on the residual right limb.  Assessment & Plan: Visit Diagnoses:  1. S/P AKA (above knee amputation), left (HCC)   2. Right below-knee amputee [facility])     Plan: Proceed with routine wound care for the right he will hold on using the prosthesis until this heals.  Follow-Up Instructions: Return in about 4 weeks (around 08/04/2023).   Ortho Exam  Patient is alert, oriented, no adenopathy, well-dressed, normal affect, normal respiratory effort. Examination of the right below-knee amputation there is no cellulitis.  Patient has a flat granulating pressure ulcer over the residual limb that is 2 cm in diameter and 0.1 mm deep.  There is no exposed bone or tendon no drainage.  No cellulitis.  Imaging: No results found. No images are attached to the encounter.  Labs: Lab Results  Component Value Date   HGBA1C 11.7 (H) 02/05/2023   HGBA1C 10.8 (H) 08/18/2021   HGBA1C 12.3 (H) 01/21/2021   ESRSEDRATE 110 (H) 02/13/2023   ESRSEDRATE 56 (H) 02/07/2023   CRP 13.0 (H) 02/07/2023   CRP 22.7 (H) 08/20/2021   CRP 23.2 (H) 08/20/2021   REPTSTATUS 04/07/2023 FINAL 04/02/2023   GRAMSTAIN  03/11/2023    ABUNDANT WBC PRESENT, PREDOMINANTLY PMN RARE GRAM POSITIVE COCCI IN PAIRS    CULT  04/02/2023    NO GROWTH 5 DAYS Performed at [facility], [address], [place], [place] [address]    LABORGA  METHICILLIN RESISTANT STAPHYLOCOCCUS AUREUS 03/11/2023     Lab Results  Component Value Date   ALBUMIN 3.9 06/08/2023   ALBUMIN 3.9 04/27/2023   ALBUMIN 2.2 (L) 03/23/2023   PREALBUMIN 5 (L) 03/11/2023    Lab Results  Component Value Date   MG 1.7 03/18/2023   MG 1.6 (L) 02/13/2023   MG 1.8 02/12/2023   No results found for: "VD25OH"  Lab Results  Component Value Date   PREALBUMIN 5 (L) 03/11/2023      Latest Ref Rng & Units 04/08/2023    7:47 AM 04/07/2023    5:23 AM 04/04/2023    3:07 AM  CBC EXTENDED  WBC 4.0 - 10.5 K/uL 11.2  13.0  16.3   RBC 4.22 - 5.81 MIL/uL 4.58  4.46  4.34   Hemoglobin 13.0 - 17.0 g/dL 13.2  44.0  10.2   HCT 39.0 - 52.0 % 35.2  35.3  34.6   Platelets 150 - 400 K/uL 385  399  404   NEUT# 1.7 - 7.7 K/uL 6.8  8.5    Lymph# 0.7 - 4.0 K/uL 3.0  3.0       There is no height or weight on file to calculate BMI.  Orders:  No orders of the defined types were placed  in this encounter.  No orders of the defined types were placed in this encounter.    Procedures: No procedures performed  Clinical Data: No additional findings.  ROS:  All other systems negative, except as noted in the HPI. Review of Systems  Objective: Vital Signs: There were no vitals taken for this visit.  Specialty Comments:  No specialty comments available.  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   (HFpEF) heart failure with preserved ejection fraction (HCC) 04/02/2023   Lactic acidosis 04/02/2023   Acute respiratory failure with hypoxia (HCC) 04/02/2023   Leukocytosis 04/02/2023   Uncontrolled type 2 diabetes mellitus with hyperglycemia, with long-term current use of insulin (HCC) 04/02/2023   Hypochromic anemia 04/02/2023   PVD (peripheral vascular disease) (HCC) 04/02/2023   Thrombocytosis 04/02/2023   Respiratory distress 03/21/2023   Osteomyelitis of ankle or foot,  acute, right (HCC) 03/11/2023   Abscess of right lower leg 03/11/2023   Below-knee amputation of right lower extremity (HCC) 03/11/2023   Chronic respiratory failure with hypoxia (HCC) 02/12/2023   Pulmonary infiltrates 02/12/2023   Acute on chronic heart failure with preserved ejection fraction (HFpEF) (HCC) 02/12/2023   Chronic osteomyelitis of right foot with draining sinus (HCC) 02/07/2023   Paroxysmal atrial flutter (HCC) 02/05/2023   Hypotension 02/05/2023   Atrial fibrillation with RVR (HCC) 02/04/2023   Atrial flutter (HCC) 02/04/2023   Lower limb ischemia: ??? 08/22/2021   Diabetic acidosis without coma (HCC)    Hypokalemia    Hypomagnesemia    Hyponatremia    Multifocal pneumonia    Parapneumonic effusion 08/18/2021   Bowel Ileus (HCC) 08/18/2021   ?? 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   Ketoacidosis due to type 2 diabetes mellitus (HCC) 08/15/2021   COVID-19 virus infection 08/15/2021   S/P AKA (above knee amputation), left (HCC) 02/12/2021   Amputation of right great toe and 2nd Toe 02/12/2021   Sepsis due to left-sided neck cellulitis/MRSA 02/12/2021   Hypotensive episode    Elevated troponin    SIRS (systemic inflammatory response syndrome) (HCC) 02/11/2021   Abscess of bursa of left ankle 01/21/2021   Dehiscence of amputation stump (HCC)    Ischemia of left BKA site (HCC)    Abscess of leg without foot, left    Osteomyelitis of second toe of right foot (HCC) 07/12/2019   Below knee amputation status, left 01/25/2018   Wound dehiscence, surgical    Status post left foot surgery 12/15/2017   GERD (gastroesophageal reflux disease) 09/26/2017   Hyperlipidemia associated with type 2 diabetes mellitus (HCC) 07/08/2017   Snoring 06/07/2016   Chest tube in place 01/22/2016   Abnormal nuclear stress test 01/22/2016   Pain, chronic due to trauma 07/04/2012   Complex regional pain syndrome I of lower limb 07/04/2012   COPD (chronic obstructive pulmonary disease)  (HCC) 01/27/2011   Pre-operative cardiovascular examination 01/27/2011   Nonspecific abnormal electrocardiogram (ECG) (EKG) 01/27/2011   Murmur 01/27/2011   DM2 (diabetes mellitus, type 2) (HCC)    HTN (hypertension)    HLD (hyperlipidemia)    Tobacco abuse    Past Medical History:  Diagnosis Date   Acute renal failure (HCC)    in setting of NSAID use and orthopedic surgery 2010   Anxiety and depression    Chronic diastolic CHF (congestive heart failure) (HCC)    a. Echo 6/17: severe conc LVH, vigorous EF, EF 65-70%, no dynamic obstruction, no  RWMA, Gr 1 DD, mild TR  //  b. 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 LHC 2002: irregs  //  b. LHC in 8/17: no sig CAD, apical DK, hyperdynamic LV, LVEDP 28   History of kidney stones    History of nuclear stress test    a. Nuc 7/17: Overall, intermediate risk nuclear stress test secondary to small size of apical lateral defect and reduced ejection fraction.  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    Kidney calculi    Palpitations    Pneumonia    PTSD (post-traumatic stress disorder)    Tobacco abuse    Transient ischemic attack     Family History  Problem Relation Age of Onset   Leukemia Mother 80       died   Lung cancer Father 65       died   Heart attack Brother 74   Heart attack Brother 58   Hypertension Brother        X3   Hypertension Sister        X2   Diabetes Sister    Stroke Sister    Diabetes Sister    Other Brother        Set designer accident    Past Surgical  History:  Procedure Laterality Date   ABDOMINAL AORTOGRAM W/LOWER EXTREMITY N/A 01/31/2023   Procedure: ABDOMINAL AORTOGRAM W/LOWER EXTREMITY;  Surgeon: Maeola Harman, MD;  [facility] INVASIVE CV LAB;  Service: Cardiovascular;  Laterality: N/A;   AMPUTATION Left 01/03/2018   Procedure: LEFT MIDFOOT AMPUTATION/REVISION MIDAMPUTAION;  Surgeon: Kathryne Hitch, MD;  Location: [facility] OR;  Service: Orthopedics;  Laterality: Left;   AMPUTATION Left 01/25/2018   Procedure: LEFT BELOW KNEE AMPUTATION;  Surgeon: Nadara Mustard, MD;  Location: [facility] OR;  Service: Orthopedics;  Laterality: Left;   AMPUTATION Left 01/21/2021   Procedure: LEFT ABOVE KNEE AMPUTATION;  Surgeon: Nadara Mustard, MD;  Location: [facility] OR;  Service: Orthopedics;  Laterality: Left;   AMPUTATION Right 02/09/2023   Procedure: RIGHT TRANSMETATARSAL AMPUTATION;  Surgeon: Nadara Mustard, MD;  Location: [facility] OR;  Service: Orthopedics;  Laterality: Right;   AMPUTATION Right 03/11/2023   Procedure: RIGHT BELOW KNEE AMPUTATION;  Surgeon: Nadara Mustard, MD;  Location: [facility] OR;  Service: Orthopedics;  Laterality: Right;   AMPUTATION TOE Right 07/17/2019   Procedure: AMPUTATION RIGHT FOOT 2ND TOE;  Surgeon: Kathryne Hitch, MD;  Location: [facility];  Service: Orthopedics;  Laterality: Right;   APPLICATION OF WOUND VAC Left 01/21/2021   Procedure: APPLICATION OF WOUND VAC;  Surgeon: Nadara Mustard, MD;  Location: [facility] OR;  Service: Orthopedics;  Laterality: Left;   BELOW KNEE LEG AMPUTATION Left 01/25/2018   CARDIAC CATHETERIZATION N/A 01/22/2016   Procedure: Left Heart Cath and Coronary Angiography;  Surgeon: Peter M Swaziland, MD;  Location: [facility]  INVASIVE CV LAB;  Service: Cardiovascular;  Laterality: N/A;   CARDIOVERSION N/A 03/17/2023   Procedure: CARDIOVERSION;  Surgeon: Chrystie Nose, MD;  Location: [facility] INVASIVE CV LAB;  Service: Cardiovascular;  Laterality: N/A;   FOOT AMPUTATION Bilateral    I & D EXTREMITY Left  12/15/2017   Procedure: IRRIGATION AND DEBRIDEMENT LEFT FOOT ULCER;  Surgeon: Kathryne Hitch, MD;  Location: [facility] ORS;  Service: Orthopedics;  Laterality: Left;   I & D EXTREMITY Left 07/25/2020   Procedure: LEFT BELOW KNEE AMPUTATION ABSCESS EXCISION AND SKIN GRAFT;  Surgeon: Nadara Mustard, MD;  Location: [facility] OR;  Service: Orthopedics;  Laterality: Left;   I & D EXTREMITY Left 08/22/2020   Procedure: DEBRIDEMENT LEFT BELOW KNEE AMPUTATION AND APPLY KERECIS SKIN GRAFT;  Surgeon: Nadara Mustard, MD;  Location: [facility] OR;  Service: Orthopedics;  Laterality: Left;   LITHOTRIPSY     PERIPHERAL VASCULAR INTERVENTION  01/31/2023   Procedure: PERIPHERAL VASCULAR INTERVENTION;  Surgeon: Maeola Harman, MD;  Location: [facility] INVASIVE CV LAB;  Service: Cardiovascular;;   TEE WITHOUT CARDIOVERSION N/A 03/17/2023   Procedure: TRANSESOPHAGEAL ECHOCARDIOGRAM;  Surgeon: Chrystie Nose, MD;  Location: [facility] INVASIVE CV LAB;  Service: Cardiovascular;  Laterality: N/A;   TENDON LENGTHENING Bilateral    calf   TONSILLECTOMY     Social History   Occupational History   Occupation: DISABLED  Tobacco Use   Smoking status: Every Day    Current packs/day: 1.00    Average packs/day: 1 pack/day for 40.0 years (40.0 ttl pk-yrs)    Types: Cigarettes   Smokeless tobacco: Never  Vaping Use   Vaping status: Never Used  Substance and Sexual Activity   Alcohol use: No   Drug use: No   Sexual activity: Not on file

## 2023-07-22 ENCOUNTER — Ambulatory Visit: Payer: Medicare Other | Attending: Cardiology | Admitting: Cardiology

## 2023-07-22 ENCOUNTER — Encounter: Payer: Self-pay | Admitting: Cardiology

## 2023-07-22 VITALS — BP 118/68 | HR 86

## 2023-07-22 DIAGNOSIS — Z79899 Other long term (current) drug therapy: Secondary | ICD-10-CM

## 2023-07-22 DIAGNOSIS — I4891 Unspecified atrial fibrillation: Secondary | ICD-10-CM

## 2023-07-22 DIAGNOSIS — I4892 Unspecified atrial flutter: Secondary | ICD-10-CM

## 2023-07-22 NOTE — Patient Instructions (Signed)
Medication Instructions:  Your physician recommends that you continue on your current medications as directed. Please refer to the Current Medication list given to you today.  *If you need a refill on your cardiac medications before your next appointment, please call your pharmacy*  Testing/Procedures: You will have a loop recorder implanted at your next office visit. There are no restrictions or special instructions prior to this appointment. Please note that you will not be able to shower for 72 hours after the procedure.    Follow-Up: At [facility], you and your health needs are our priority.  As part of our continuing mission to provide you with exceptional heart care, we have created designated Provider Care Teams.  These Care Teams include your primary Cardiologist (physician) and Advanced Practice Providers (APPs -  Physician Assistants and Nurse Practitioners) who all work together to provide you with the care you need, when you need it.  Your next appointment:     Provider:   Steffanie Dunn, MD

## 2023-07-22 NOTE — Progress Notes (Signed)
Electrophysiology Office Follow up Visit Note:    Date:  07/22/2023   ID:  Troy [name], DOB January 30, 1961, MRN [id number]  PCP:  Rebecka Apley, NP  [facility] Cardiologist:  None  [facility] Electrophysiologist:  Lanier Prude, MD    Interval History:     Troy [name] is a 63 y.o. male who presents for a follow up visit.   I met the patient when he was hospitalized in September 2024.  He has an extensive past medical history that includes peripheral vascular disease post a right sided BKA during this September 2024 admission.  His medical history also includes hypertension, hyperlipidemia, diabetes, atrial flutter with rapid ventricular rates.  During his hospitalization in the setting of osteomyelitis he was in atrial flutter with rapid ventricular rates.  He was started on amiodarone and cardioverted.  He ultimately was discharged to a rehab facility.  He uses a wheelchair for mobility.  He tells me that since leaving the hospital he has done fairly well.  His BKA incision has healed.  He has not felt any abnormal heart rhythm since leaving the hospital.  He is tolerating the amiodarone.  He is interested in avoiding long-term amiodarone use if at all possible.        Past medical, surgical, social and family history were reviewed.  ROS:   Please see the history of present illness.    All other systems reviewed and are negative.  EKGs/Labs/Other Studies Reviewed:    The following studies were reviewed today:     EKG Interpretation Date/Time:  Friday July 22 2023 15:29:49 EST Ventricular Rate:  86 PR Interval:  162 QRS Duration:  86 QT Interval:  368 QTC Calculation: 440 R Axis:   86  Text Interpretation: Normal sinus rhythm Confirmed by Steffanie Dunn [phone]) on 07/22/2023 3:43:47 PM    Physical Exam:    VS:  BP 118/68 (BP Location: Right Arm, Patient Position: Sitting, Cuff Size: Normal)   Pulse 86   SpO2 98%     Wt Readings from Last 3  Encounters:  04/27/23 175 lb (79.4 kg)  04/14/23 160 lb (72.6 kg)  04/06/23 175 lb 0.7 oz (79.4 kg)     GEN: no distress.  In wheelchair.  Appears older than stated age. CARD: RRR, No MRG RESP: No IWOB. CTAB.      ASSESSMENT:    1. Atrial flutter, unspecified type (HCC)   2. Atrial fibrillation with RVR (HCC)   3. Encounter for long-term (current) use of high-risk medication    PLAN:    In order of problems listed above:  #Atrial flutter #High risk drug monitoring-amiodarone I first met the patient when he was hospitalized with osteomyelitis and atrial flutter.  Is unclear to me whether or not he has a prior history of atrial flutter.  He has been managed with amiodarone and is currently in normal sinus rhythm.  Amiodarone is not an ideal long-term solution for him given his history of cirrhosis and his young age.  Now that his wounds have healed and he is recovering out of the hospital, I would like to stop the amiodarone and have some form of heart rhythm surveillance.  We discussed strategies including wearable monitors and implantable loop recorder's.  He is very clear that he would like to proceed with a loop recorder for heart rhythm monitoring.  I discussed the loop recorder implant procedure in detail including the risks.  I discussed the monthly monitoring cost.  He would like to proceed with implant in the next few weeks.  Will plan for a [employer] loop recorder.  After the loop recorder is implanted, we will stop amiodarone and monitor.  If he has recurrence of atrial flutter, can consider catheter ablation.  Follow-up for loop recorder implant    Signed, Steffanie Dunn, MD, [facility], [facility] 07/22/2023 7:49 PM    Electrophysiology [facility]

## 2023-08-04 ENCOUNTER — Ambulatory Visit: Payer: Medicare Other | Admitting: Orthopedic Surgery

## 2023-08-04 ENCOUNTER — Encounter: Payer: Self-pay | Admitting: Orthopedic Surgery

## 2023-08-04 DIAGNOSIS — Z89511 Acquired absence of right leg below knee: Secondary | ICD-10-CM | POA: Diagnosis not present

## 2023-08-04 DIAGNOSIS — Z89612 Acquired absence of left leg above knee: Secondary | ICD-10-CM

## 2023-08-04 NOTE — Progress Notes (Signed)
Office Visit Note   Patient: Troy [name]           Date of Birth: 25-Nov-1960           MRN: [id number] Visit Date: 08/04/2023              Requested by: Rebecka Apley, NP [address] [place],  [place] [address] PCP: Rebecka Apley, NP  Chief Complaint  Patient presents with   Right Leg - Follow-up    Right BKA      HPI: Patient is status post left above-knee amputation and right below-knee amputation.  Patient has a persistent wound laterally on the right transtibial amputation.  Patient is 5 months out from right BKA.  Assessment & Plan: Visit Diagnoses:  1. Right below-knee amputee (HCC)   2. S/P AKA (above knee amputation), left (HCC)     Plan: Continue with compression and dressing changes.  Reevaluate in 4 weeks at which time patient should be able to be fitted for a prosthesis.  Follow-Up Instructions: Return in about 4 weeks (around 09/01/2023).   Ortho Exam  Patient is alert, oriented, no adenopathy, well-dressed, normal affect, normal respiratory effort. Examination patient has a wound over the lateral aspect of the residual limb right transtibial amputation.  This has healthy granulation tissue it is 1 x 2 cm.  Silver nitrate was used on the hypergranulation tissue there is no surrounding cellulitis no exposed bone or sutures no wound tunneling.  Imaging: No results found.   Labs: Lab Results  Component Value Date   HGBA1C 11.7 (H) 02/05/2023   HGBA1C 10.8 (H) 08/18/2021   HGBA1C 12.3 (H) 01/21/2021   ESRSEDRATE 110 (H) 02/13/2023   ESRSEDRATE 56 (H) 02/07/2023   CRP 13.0 (H) 02/07/2023   CRP 22.7 (H) 08/20/2021   CRP 23.2 (H) 08/20/2021   REPTSTATUS 04/07/2023 FINAL 04/02/2023   GRAMSTAIN  03/11/2023    ABUNDANT WBC PRESENT, PREDOMINANTLY PMN RARE GRAM POSITIVE COCCI IN PAIRS    CULT  04/02/2023    NO GROWTH 5 DAYS Performed at [facility], [address], [place], [place] [address]    LABORGA METHICILLIN  RESISTANT STAPHYLOCOCCUS AUREUS 03/11/2023     Lab Results  Component Value Date   ALBUMIN 3.9 06/08/2023   ALBUMIN 3.9 04/27/2023   ALBUMIN 2.2 (L) 03/23/2023   PREALBUMIN 5 (L) 03/11/2023    Lab Results  Component Value Date   MG 1.7 03/18/2023   MG 1.6 (L) 02/13/2023   MG 1.8 02/12/2023   No results found for: "VD25OH"  Lab Results  Component Value Date   PREALBUMIN 5 (L) 03/11/2023      Latest Ref Rng & Units 04/08/2023    7:47 AM 04/07/2023    5:23 AM 04/04/2023    3:07 AM  CBC EXTENDED  WBC 4.0 - 10.5 K/uL 11.2  13.0  16.3   RBC 4.22 - 5.81 MIL/uL 4.58  4.46  4.34   Hemoglobin 13.0 - 17.0 g/dL 62.1  30.8  65.7   HCT 39.0 - 52.0 % 35.2  35.3  34.6   Platelets 150 - 400 K/uL 385  399  404   NEUT# 1.7 - 7.7 K/uL 6.8  8.5    Lymph# 0.7 - 4.0 K/uL 3.0  3.0       There is no height or weight on file to calculate BMI.  Orders:  No orders of the defined types were placed in this encounter.  No orders of the defined types were placed in this encounter.    Procedures: No procedures performed  Clinical Data: No additional findings.  ROS:  All other systems negative, except as noted in the HPI. Review of Systems  Objective: Vital Signs: There were no vitals taken for this visit.  Specialty Comments:  No specialty comments available.  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   (HFpEF) heart failure with preserved ejection fraction (HCC) 04/02/2023   Lactic acidosis 04/02/2023   Acute respiratory failure with hypoxia (HCC) 04/02/2023   Leukocytosis 04/02/2023   Uncontrolled type 2 diabetes mellitus with hyperglycemia, with long-term current use of insulin (HCC) 04/02/2023   Hypochromic anemia 04/02/2023   PVD (peripheral vascular disease) (HCC) 04/02/2023   Thrombocytosis 04/02/2023   Respiratory distress 03/21/2023   Osteomyelitis of ankle or foot, acute, right  (HCC) 03/11/2023   Abscess of right lower leg 03/11/2023   Below-knee amputation of right lower extremity (HCC) 03/11/2023   Chronic respiratory failure with hypoxia (HCC) 02/12/2023   Pulmonary infiltrates 02/12/2023   Acute on chronic heart failure with preserved ejection fraction (HFpEF) (HCC) 02/12/2023   Chronic osteomyelitis of right foot with draining sinus (HCC) 02/07/2023   Paroxysmal atrial flutter (HCC) 02/05/2023   Hypotension 02/05/2023   Atrial fibrillation with RVR (HCC) 02/04/2023   Atrial flutter (HCC) 02/04/2023   Lower limb ischemia: ??? 08/22/2021   Diabetic acidosis without coma (HCC)    Hypokalemia    Hypomagnesemia    Hyponatremia    Multifocal pneumonia    Parapneumonic effusion 08/18/2021   Bowel Ileus (HCC) 08/18/2021   ?? 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   Ketoacidosis due to type 2 diabetes mellitus (HCC) 08/15/2021   COVID-19 virus infection 08/15/2021   S/P AKA (above knee amputation), left (HCC) 02/12/2021   Amputation of right great toe and 2nd Toe 02/12/2021   Sepsis due to left-sided neck cellulitis/MRSA 02/12/2021   Hypotensive episode    Elevated troponin    SIRS (systemic inflammatory response syndrome) (HCC) 02/11/2021   Abscess of bursa of left ankle 01/21/2021   Dehiscence of amputation stump (HCC)    Ischemia of left BKA site (HCC)    Abscess of leg without foot, left    Osteomyelitis of second toe of right foot (HCC) 07/12/2019   Below knee amputation status, left 01/25/2018   Wound dehiscence, surgical    Status post left foot surgery 12/15/2017   GERD (gastroesophageal reflux disease) 09/26/2017   Hyperlipidemia associated with type 2 diabetes mellitus (HCC) 07/08/2017   Snoring 06/07/2016   Chest tube in place 01/22/2016   Abnormal nuclear stress test 01/22/2016   Pain, chronic due to trauma 07/04/2012   Complex regional pain syndrome I of lower limb 07/04/2012   COPD (chronic obstructive pulmonary disease) (HCC) 01/27/2011    Pre-operative cardiovascular examination 01/27/2011   Nonspecific abnormal electrocardiogram (ECG) (EKG) 01/27/2011   Murmur 01/27/2011   DM2 (diabetes mellitus, type 2) (HCC)    HTN (hypertension)    HLD (hyperlipidemia)    Tobacco abuse    Past Medical History:  Diagnosis Date   Acute renal failure (HCC)    in setting of NSAID use and orthopedic surgery 2010   Anxiety and depression    Chronic diastolic CHF (congestive heart failure) (HCC)    a. Echo 6/17: severe conc LVH, vigorous EF, EF 65-70%, no dynamic obstruction, no RWMA, Gr 1 DD,  mild TR  //  b. 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 LHC 2002: irregs  //  b. LHC in 8/17: no sig CAD, apical DK, hyperdynamic LV, LVEDP 28   History of kidney stones    History of nuclear stress test    a. Nuc 7/17: Overall, intermediate risk nuclear stress test secondary to small size of apical lateral defect and reduced ejection fraction.  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    Kidney calculi    Palpitations    Pneumonia    PTSD (post-traumatic stress disorder)    Tobacco abuse    Transient ischemic attack     Family History  Problem Relation Age of Onset   Leukemia Mother 64       died   Lung cancer Father 30       died   Heart attack Brother 90   Heart attack Brother 26   Hypertension Brother        X3   Hypertension Sister        X2   Diabetes Sister    Stroke Sister    Diabetes Sister    Other Brother        Set designer accident    Past Surgical History:   Procedure Laterality Date   ABDOMINAL AORTOGRAM W/LOWER EXTREMITY N/A 01/31/2023   Procedure: ABDOMINAL AORTOGRAM W/LOWER EXTREMITY;  Surgeon: Maeola Harman, MD;  Location: [facility] INVASIVE CV LAB;  Service: Cardiovascular;  Laterality: N/A;   AMPUTATION Left 01/03/2018   Procedure: LEFT MIDFOOT AMPUTATION/REVISION MIDAMPUTAION;  Surgeon: Kathryne Hitch, MD;  Location: [facility] OR;  Service: Orthopedics;  Laterality: Left;   AMPUTATION Left 01/25/2018   Procedure: LEFT BELOW KNEE AMPUTATION;  Surgeon: Nadara Mustard, MD;  Location: [facility] OR;  Service: Orthopedics;  Laterality: Left;   AMPUTATION Left 01/21/2021   Procedure: LEFT ABOVE KNEE AMPUTATION;  Surgeon: Nadara Mustard, MD;  Location: [facility] OR;  Service: Orthopedics;  Laterality: Left;   AMPUTATION Right 02/09/2023   Procedure: RIGHT TRANSMETATARSAL AMPUTATION;  Surgeon: Nadara Mustard, MD;  Location: [facility] OR;  Service: Orthopedics;  Laterality: Right;   AMPUTATION Right 03/11/2023   Procedure: RIGHT BELOW KNEE AMPUTATION;  Surgeon: Nadara Mustard, MD;  Location: [facility] OR;  Service: Orthopedics;  Laterality: Right;   AMPUTATION TOE Right 07/17/2019   Procedure: AMPUTATION RIGHT FOOT 2ND TOE;  Surgeon: Kathryne Hitch, MD;  Location: [facility];  Service: Orthopedics;  Laterality: Right;   APPLICATION OF WOUND VAC Left 01/21/2021   Procedure: APPLICATION OF WOUND VAC;  Surgeon: Nadara Mustard, MD;  Location: [facility] OR;  Service: Orthopedics;  Laterality: Left;   BELOW KNEE LEG AMPUTATION Left 01/25/2018   CARDIAC CATHETERIZATION N/A 01/22/2016   Procedure: Left Heart Cath and Coronary Angiography;  Surgeon: Peter M Swaziland, MD;  Location: [facility] INVASIVE CV LAB;  Service: Cardiovascular;  Laterality: N/A;   CARDIOVERSION N/A 03/17/2023   Procedure: CARDIOVERSION;  Surgeon: Chrystie Nose, MD;  Location: [facility] INVASIVE CV LAB;  Service: Cardiovascular;  Laterality: N/A;   FOOT AMPUTATION Bilateral    I & D EXTREMITY Left 12/15/2017    Procedure: IRRIGATION AND DEBRIDEMENT LEFT FOOT ULCER;  Surgeon: Kathryne Hitch, MD;  Location: [facility] ORS;  Service: Orthopedics;  Laterality: Left;   I & D EXTREMITY Left 07/25/2020   Procedure: LEFT BELOW KNEE AMPUTATION ABSCESS EXCISION AND SKIN GRAFT;  Surgeon: Nadara Mustard, MD;  Location: [facility] OR;  Service: Orthopedics;  Laterality: Left;   I & D EXTREMITY Left 08/22/2020   Procedure: DEBRIDEMENT LEFT BELOW KNEE AMPUTATION AND APPLY KERECIS SKIN GRAFT;  Surgeon: Nadara Mustard, MD;  Location: [facility] OR;  Service: Orthopedics;  Laterality: Left;   LITHOTRIPSY     PERIPHERAL VASCULAR INTERVENTION  01/31/2023   Procedure: PERIPHERAL VASCULAR INTERVENTION;  Surgeon: Maeola Harman, MD;  Location: [facility] INVASIVE CV LAB;  Service: Cardiovascular;;   TEE WITHOUT CARDIOVERSION N/A 03/17/2023   Procedure: TRANSESOPHAGEAL ECHOCARDIOGRAM;  Surgeon: Chrystie Nose, MD;  Location: [facility] INVASIVE CV LAB;  Service: Cardiovascular;  Laterality: N/A;   TENDON LENGTHENING Bilateral    calf   TONSILLECTOMY     Social History   Occupational History   Occupation: DISABLED  Tobacco Use   Smoking status: Every Day    Current packs/day: 1.00    Average packs/day: 1 pack/day for 40.0 years (40.0 ttl pk-yrs)    Types: Cigarettes   Smokeless tobacco: Never  Vaping Use   Vaping status: Never Used  Substance and Sexual Activity   Alcohol use: No   Drug use: No   Sexual activity: Not on file

## 2023-08-11 ENCOUNTER — Telehealth: Payer: Self-pay

## 2023-08-11 NOTE — Telephone Encounter (Signed)
Troy [name], wound care nurse wanted to let Dr. Lajoyce Corners know that the same spot on patients right BKA has reopened again and has some drainage.  Stated that spot reopens each time after patient finishes antibiotics.  Would like a CB to discuss what should be done?  CB# [phone].  Please advise.

## 2023-08-12 ENCOUNTER — Ambulatory Visit: Payer: Medicare Other | Attending: Cardiology | Admitting: Cardiology

## 2023-08-12 VITALS — BP 128/62 | HR 73 | Ht 72.0 in | Wt 183.5 lb

## 2023-08-12 DIAGNOSIS — I4892 Unspecified atrial flutter: Secondary | ICD-10-CM | POA: Diagnosis not present

## 2023-08-12 DIAGNOSIS — Z79899 Other long term (current) drug therapy: Secondary | ICD-10-CM

## 2023-08-12 DIAGNOSIS — I10 Essential (primary) hypertension: Secondary | ICD-10-CM

## 2023-08-12 NOTE — Telephone Encounter (Signed)
Called exact number below and is the wrong number.

## 2023-08-12 NOTE — Progress Notes (Signed)
  Electrophysiology Office Follow up Visit Note:    Date:  08/12/2023   ID:  Troy [name], DOB 08-18-1960, MRN [id number]  PCP:  Rebecka Apley, NP  [facility] Cardiologist:  None  [facility] Electrophysiologist:  Lanier Prude, MD    Interval History:     Troy [name] is a 63 y.o. male who presents for a follow up visit.   I last saw the patient July 22, 2023 for a reported history of atrial arrhythmias.  Given his young age, the goal was to stop amiodarone and have an ongoing heart rhythm monitoring strategy for A-fib/flutter surveillance.  We discussed using a loop recorder and he wanted to proceed with that plan.    Today he presents to the clinic for follow-up.  He tells me that he does not want to take amiodarone and does not want a loop recorder implanted.  He has no plans for heart rhythm monitoring.  He thinks his heart rhythm is "fine".        Past medical, surgical, social and family history were reviewed.  ROS:   Please see the history of present illness.    All other systems reviewed and are negative.  EKGs/Labs/Other Studies Reviewed:    The following studies were reviewed today:          Physical Exam:    VS:  BP 128/62   Pulse 73   Ht 6' (1.829 m)   Wt 183 lb 8 oz (83.2 kg)   SpO2 99%   BMI 24.89 kg/m     Wt Readings from Last 3 Encounters:  08/12/23 183 lb 8 oz (83.2 kg)  04/27/23 175 lb (79.4 kg)  04/14/23 160 lb (72.6 kg)     GEN: no distress in wheelchair CARD: RRR, No MRG RESP: No IWOB. CTAB.      ASSESSMENT:    1. Atrial flutter, unspecified type (HCC)   2. Encounter for long-term (current) use of high-risk medication   3. Primary hypertension    PLAN:    In order of problems listed above:  #Atrial flutter #High risk drug monitoring-amiodarone Stop amiodarone today at the patient's request.  I discussed the original plan of heart rhythm surveillance but the patient declines and is not interested in  discussing his history of atrial fibrillation or flutter today.  #Hypertension At goal today.  Recommend checking blood pressures 1-2 times per week at home and recording the values.  Recommend bringing these recordings to the primary care physician.     Signed, Steffanie Dunn, MD, [facility], [facility] 08/12/2023 4:22 PM    Electrophysiology [facility]

## 2023-08-12 NOTE — Patient Instructions (Signed)
Medication Instructions:  Your physician has recommended you make the following change in your medication:  1) STOP taking amiodarone *If you need a refill on your cardiac medications before your next appointment, please call your pharmacy*  Follow-Up: At [facility], you and your health needs are our priority.  As part of our continuing mission to provide you with exceptional heart care, we have created designated Provider Care Teams.  These Care Teams include your primary Cardiologist (physician) and Advanced Practice Providers (APPs -  Physician Assistants and Nurse Practitioners) who all work together to provide you with the care you need, when you need it.  Your next appointment:   1 year  Provider:   You will see one of the following Advanced Practice Providers on your designated Care Team:   Francis Dowse, Charlott Holler [address]" [place], [place] Sherie Don, NP Canary Brim, NP

## 2023-08-13 ENCOUNTER — Encounter: Payer: Self-pay | Admitting: Cardiology

## 2023-08-29 ENCOUNTER — Other Ambulatory Visit (HOSPITAL_COMMUNITY): Payer: Self-pay | Admitting: Adult Health

## 2023-08-29 DIAGNOSIS — T8743 Infection of amputation stump, right lower extremity: Secondary | ICD-10-CM

## 2023-08-29 DIAGNOSIS — M86161 Other acute osteomyelitis, right tibia and fibula: Secondary | ICD-10-CM

## 2023-09-01 ENCOUNTER — Ambulatory Visit: Payer: Medicare Other | Admitting: Family

## 2023-09-01 DIAGNOSIS — L97211 Non-pressure chronic ulcer of right calf limited to breakdown of skin: Secondary | ICD-10-CM | POA: Diagnosis not present

## 2023-09-01 DIAGNOSIS — Z89511 Acquired absence of right leg below knee: Secondary | ICD-10-CM

## 2023-09-01 MED ORDER — DOXYCYCLINE HYCLATE 100 MG PO TABS: 100.0000 mg | ORAL_TABLET | Freq: Two times a day (BID) | ORAL | 0 refills | Status: DC

## 2023-09-01 NOTE — Progress Notes (Unsigned)
 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  This had previously been well-healed  He has 2 new open areas to the anterior shin appears to have a traumatic wound there is 1 pinpoint open area along his incision as well  No chills.  No fever.  Patient unconcerned  Assessment & Plan: Visit Diagnoses: No diagnosis found.  Plan: Doxycycline for the 1 ulcer that is not infected will have him follow-up in 2 weeks  Mupirocin dressing changes  Follow-Up Instructions: No follow-ups on file.   Ortho Exam  Patient is alert, oriented, no adenopathy, well-dressed, normal affect, normal respiratory effort. On examination right residual limb this is well consolidated well-healed over the anterior shin he has 1 to 2 cm x 1 cm abrasion is a supra Fishel with fresh blood there is no sign of infection no surrounding maceration.  Along his incision line laterally there is 1 pinpoint open area with 1 drop of purulent drainage this is just over the distal fibula no exposed bone  Imaging: No results found. No images are attached to the encounter.  Labs: Lab Results  Component Value Date   HGBA1C 11.7 (H) 02/05/2023   HGBA1C 10.8 (H) 08/18/2021   HGBA1C 12.3 (H) 01/21/2021   ESRSEDRATE 110 (H) 02/13/2023   ESRSEDRATE 56 (H) 02/07/2023   CRP 13.0 (H) 02/07/2023   CRP 22.7 (H) 08/20/2021   CRP 23.2 (H) 08/20/2021   REPTSTATUS 04/07/2023 FINAL 04/02/2023   GRAMSTAIN  03/11/2023    ABUNDANT  WBC PRESENT, PREDOMINANTLY PMN RARE GRAM POSITIVE COCCI IN PAIRS    CULT  04/02/2023    NO GROWTH 5 DAYS Performed at [facility], [address] [place], [place] [address]    LABORGA METHICILLIN RESISTANT STAPHYLOCOCCUS AUREUS 03/11/2023     Lab Results  Component Value Date   ALBUMIN 3.9 06/08/2023   ALBUMIN 3.9 04/27/2023   ALBUMIN 2.2 (L) 03/23/2023   PREALBUMIN 5 (L) 03/11/2023    Lab Results  Component Value Date   MG 1.7 03/18/2023   MG 1.6 (L) 02/13/2023   MG 1.8 02/12/2023   No results found for: "VD25OH"  Lab Results  Component Value Date   PREALBUMIN 5 (L) 03/11/2023      Latest Ref Rng & Units 04/08/2023    7:47 AM 04/07/2023    5:23 AM 04/04/2023    3:07 AM  CBC EXTENDED  WBC 4.0 - 10.5 K/uL 11.2  13.0  16.3   RBC 4.22 - 5.81 MIL/uL 4.58  4.46  4.34   Hemoglobin 13.0 - 17.0 g/dL 62.1  30.8  65.7   HCT 39.0 - 52.0 % 35.2  35.3  34.6   Platelets 150 - 400 K/uL 385  399  404   NEUT# 1.7 - 7.7 K/uL 6.8  8.5    Lymph# 0.7 - 4.0 K/uL 3.0  3.0       There is no height or weight on file to calculate BMI.  Orders:  No orders of the defined types were placed in this encounter.  No orders of the defined types were placed in this encounter.    Procedures: No procedures performed  Clinical Data: No additional findings.  ROS:  All other systems negative, except as noted in the HPI. Review of Systems  Objective: Vital Signs: There were no vitals taken for this visit.  Specialty Comments:  No specialty comments available.  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   (HFpEF) heart failure with preserved ejection fraction (HCC) 04/02/2023   Lactic acidosis 04/02/2023   Acute respiratory failure with hypoxia (HCC) 04/02/2023   Leukocytosis 04/02/2023   Uncontrolled type 2 diabetes mellitus with hyperglycemia, with long-term current use of insulin (HCC)  04/02/2023   Hypochromic anemia 04/02/2023   PVD (peripheral vascular disease) (HCC) 04/02/2023   Thrombocytosis 04/02/2023   Respiratory distress 03/21/2023   Osteomyelitis of ankle or foot, acute, right (HCC) 03/11/2023   Abscess of right lower leg 03/11/2023   Below-knee amputation of right lower extremity (HCC) 03/11/2023   Chronic respiratory failure with hypoxia (HCC) 02/12/2023   Pulmonary infiltrates 02/12/2023   Acute on chronic heart failure with preserved ejection fraction (HFpEF) (HCC) 02/12/2023   Chronic osteomyelitis of right foot with draining sinus (HCC) 02/07/2023   Paroxysmal atrial flutter (HCC) 02/05/2023   Hypotension 02/05/2023   Atrial fibrillation with RVR (HCC) 02/04/2023   Atrial flutter (HCC) 02/04/2023   Lower limb ischemia: ??? 08/22/2021   Diabetic acidosis without coma (HCC)    Hypokalemia    Hypomagnesemia    Hyponatremia    Multifocal pneumonia    Parapneumonic effusion 08/18/2021   Bowel Ileus (HCC) 08/18/2021   ?? 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   Ketoacidosis due to type 2 diabetes mellitus (HCC) 08/15/2021   COVID-19 virus infection 08/15/2021   S/P AKA (above knee amputation), left (HCC) 02/12/2021   Amputation of right great toe and 2nd Toe 02/12/2021   Sepsis due to left-sided neck cellulitis/MRSA 02/12/2021   Hypotensive episode    Elevated troponin    SIRS (systemic inflammatory response syndrome) (HCC) 02/11/2021   Abscess of bursa of left ankle 01/21/2021   Dehiscence of amputation stump (HCC)    Ischemia of left BKA site (HCC)    Abscess of leg without foot, left    Osteomyelitis of second toe of right foot (HCC) 07/12/2019   Below knee amputation status, left 01/25/2018   Wound dehiscence, surgical    Status post left foot surgery 12/15/2017   GERD (gastroesophageal reflux disease) 09/26/2017   Hyperlipidemia associated with type 2 diabetes mellitus (HCC) 07/08/2017   Snoring 06/07/2016   Chest tube in place 01/22/2016    Abnormal nuclear stress test 01/22/2016   Pain, chronic due to trauma 07/04/2012   Complex regional pain syndrome I of lower limb 07/04/2012   COPD (chronic obstructive pulmonary disease) (HCC) 01/27/2011   Pre-operative cardiovascular examination 01/27/2011   Nonspecific abnormal electrocardiogram (ECG) (EKG) 01/27/2011   Murmur 01/27/2011   DM2 (diabetes mellitus, type 2) (HCC)    HTN (hypertension)    HLD (hyperlipidemia)    Tobacco abuse    Past Medical History:  Diagnosis Date   Acute renal failure (HCC)    in  setting of NSAID use and orthopedic surgery 2010   Anxiety and depression    Chronic diastolic CHF (congestive heart failure) (HCC)    a. Echo 6/17: severe conc LVH, vigorous EF, EF 65-70%, no dynamic obstruction, no RWMA, Gr 1 DD, mild TR  //  b. 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 LHC 2002: irregs  //  b. LHC in 8/17: no sig CAD, apical DK, hyperdynamic LV, LVEDP 28   History of kidney stones    History of nuclear stress test    a. Nuc 7/17: Overall, intermediate risk nuclear stress test secondary to small size of apical lateral defect and reduced ejection fraction.  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    Kidney calculi    Palpitations    Pneumonia    PTSD (post-traumatic stress disorder)    Tobacco abuse    Transient ischemic attack     Family History  Problem Relation Age of Onset   Leukemia Mother 96       died   Lung cancer Father 37       died   Heart attack Brother 48   Heart attack  Brother 42   Hypertension Brother        X3   Hypertension Sister        X2   Diabetes Sister    Stroke Sister    Diabetes Sister    Other Brother        Set designer accident    Past Surgical History:  Procedure Laterality Date   ABDOMINAL AORTOGRAM W/LOWER EXTREMITY N/A 01/31/2023   Procedure: ABDOMINAL AORTOGRAM W/LOWER EXTREMITY;  Surgeon: Maeola Harman, MD;  Location[facility] INVASIVE CV LAB;  Service: Cardiovascular;  Laterality: N/A;   AMPUTATION Left 01/03/2018   Procedure: LEFT MIDFOOT AMPUTATION/REVISION MIDAMPUTAION;  Surgeon: Kathryne Hitch, MD;  Location: [facility] OR;  Service: Orthopedics;  Laterality: Left;   AMPUTATION Left 01/25/2018   Procedure: LEFT BELOW KNEE AMPUTATION;  Surgeon: Nadara Mustard, MD;  Location: [facility] OR;  Service: Orthopedics;  Laterality: Left;   AMPUTATION Left 01/21/2021   Procedure: LEFT ABOVE KNEE AMPUTATION;  Surgeon: Nadara Mustard, MD;  Location: [facility] OR;  Service: Orthopedics;  Laterality: Left;   AMPUTATION Right 02/09/2023   Procedure: RIGHT TRANSMETATARSAL AMPUTATION;  Surgeon: Nadara Mustard, MD;  Location: [facility] OR;  Service: Orthopedics;  Laterality: Right;   AMPUTATION Right 03/11/2023   Procedure: RIGHT BELOW KNEE AMPUTATION;  Surgeon: Nadara Mustard, MD;  Location: [facility] OR;  Service: Orthopedics;  Laterality: Right;   AMPUTATION TOE Right 07/17/2019   Procedure: AMPUTATION RIGHT FOOT 2ND TOE;  Surgeon: Kathryne Hitch, MD;  Location: [facility];  Service: Orthopedics;  Laterality: Right;   APPLICATION OF WOUND VAC Left 01/21/2021   Procedure: APPLICATION OF WOUND VAC;  Surgeon: Nadara Mustard, MD;  Location:  [facility] OR;  Service: Orthopedics;  Laterality: Left;   BELOW KNEE LEG AMPUTATION Left 01/25/2018   CARDIAC CATHETERIZATION N/A 01/22/2016   Procedure: Left Heart Cath and Coronary Angiography;  Surgeon: Peter M Swaziland, MD;  Location: [facility] INVASIVE CV LAB;  Service: Cardiovascular;  Laterality: N/A;   CARDIOVERSION N/A 03/17/2023    Procedure: CARDIOVERSION;  Surgeon: Chrystie Nose, MD;  Location: [facility] INVASIVE CV LAB;  Service: Cardiovascular;  Laterality: N/A;   FOOT AMPUTATION Bilateral    I & D EXTREMITY Left 12/15/2017   Procedure: IRRIGATION AND DEBRIDEMENT LEFT FOOT ULCER;  Surgeon: Kathryne Hitch, MD;  Location: [facility] ORS;  Service: Orthopedics;  Laterality: Left;   I & D EXTREMITY Left 07/25/2020   Procedure: LEFT BELOW KNEE AMPUTATION ABSCESS EXCISION AND SKIN GRAFT;  Surgeon: Nadara Mustard, MD;  Location: [facility] OR;  Service: Orthopedics;  Laterality: Left;   I & D EXTREMITY Left 08/22/2020   Procedure: DEBRIDEMENT LEFT BELOW KNEE AMPUTATION AND APPLY KERECIS SKIN GRAFT;  Surgeon: Nadara Mustard, MD;  Location: [facility] OR;  Service: Orthopedics;  Laterality: Left;   LITHOTRIPSY     PERIPHERAL VASCULAR INTERVENTION  01/31/2023   Procedure: PERIPHERAL VASCULAR INTERVENTION;  Surgeon: Maeola Harman, MD;  Location: [facility] INVASIVE CV LAB;  Service: Cardiovascular;;   TEE WITHOUT CARDIOVERSION N/A 03/17/2023   Procedure: TRANSESOPHAGEAL ECHOCARDIOGRAM;  Surgeon: Chrystie Nose, MD;  Location: [facility] INVASIVE CV LAB;  Service: Cardiovascular;  Laterality: N/A;   TENDON LENGTHENING Bilateral    calf   TONSILLECTOMY     Social History   Occupational History   Occupation: DISABLED  Tobacco Use   Smoking status: Every Day    Current packs/day: 1.00    Average packs/day: 1 pack/day for 40.0 years (40.0 ttl pk-yrs)    Types: Cigarettes   Smokeless tobacco: Never  Vaping Use   Vaping status: Never Used  Substance and Sexual Activity   Alcohol use: No   Drug use: No   Sexual activity: Not on file

## 2023-09-02 ENCOUNTER — Encounter: Payer: Self-pay | Admitting: Family

## 2023-09-06 ENCOUNTER — Ambulatory Visit (HOSPITAL_COMMUNITY)
Admission: RE | Admit: 2023-09-06 | Discharge: 2023-09-06 | Disposition: A | Discharge status: Home / Self Care | Source: Ambulatory Visit | Attending: Adult Health | Admitting: Adult Health

## 2023-09-06 ENCOUNTER — Other Ambulatory Visit (HOSPITAL_COMMUNITY): Payer: Self-pay | Admitting: Adult Health

## 2023-09-06 DIAGNOSIS — M86161 Other acute osteomyelitis, right tibia and fibula: Secondary | ICD-10-CM

## 2023-09-06 DIAGNOSIS — T8743 Infection of amputation stump, right lower extremity: Secondary | ICD-10-CM

## 2023-09-08 NOTE — Progress Notes (Unsigned)
 Cardiology Office Note:  .   Date:  09/09/2023  ID:  Troy [name], DOB May 19, 1961, MRN [id number] PCP: Rebecka Apley, NP  [facility] Providers Cardiologist:  None Electrophysiologist:  Lanier Prude, MD   History of Present Illness: .    Chief Complaint  Patient presents with   Follow-up    Troy [name] is a 63 y.o. male with history of Aflutter, HFpEF, LV apical aneurysm who presents for follow-up. Diagnosed with atrial flutter at the time of R BKA last year.    History of Present Illness   Troy [name] is a 63 year old male with atrial flutter, DM, PAD, HLD, HTN and LV apical aneurysm who presents for follow-up after recent hospitalization for right BKA.  He has a history of atrial flutter and was recently hospitalized in September for a right below-knee amputation. During the hospital stay, he experienced atrial flutter and underwent cardioversion. He was previously on amiodarone, which was discontinued, but he continues to take amiodarone 200 mg daily and Eliquis 5 mg twice daily. No chest pain or trouble breathing since his last hospital discharge. He is not currently under the care of an electrophysiologist.  He has a left ventricular apical aneurysm, as shown on an echocardiogram. No history of heart attack or stroke. He is not currently taking any blood pressure medications, but his blood pressure is well controlled at 126/70 mmHg. He is on rosuvastatin for cholesterol management.  For diabetes, he self-administers insulin and takes glipizide and [name]. His last A1c was elevated at 11.7. He reports managing his diabetes 'pretty much all right.'  He is on furosemide 40 mg daily for volume control and reports no swelling. He also takes mirtazapine, pantoprazole, and oxycodone.  No smoking, alcohol, or drug use. He is not currently working.          Problem List PAD -R BKA 02/2023 -L AKA DM -A1c 11.7 HTN HLD -T chol 108, HDL 40, LDL 47, TG  104 HFpEF Atrial flutter -Dx 03/2023 7. LV apical aneurysm      ROS: All other ROS reviewed and negative. Pertinent positives noted in the HPI.     Studies Reviewed: Marland Kitchen        TTE 04/07/2023  1. 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 This has been noted on  multiple previous CT scans.. Left ventricular ejection fraction, by  estimation, is 70 to 75%. The left ventricle has hyperdynamic function.  The left ventricle demonstrates regional wall  motion abnormalities (see scoring diagram/findings for description). There  is moderate concentric left ventricular hypertrophy. Left ventricular  diastolic parameters are consistent with Grade II diastolic dysfunction  (pseudonormalization).   2. Right ventricular systolic function is low normal. The right  ventricular size is mildly enlarged. Mildly increased right ventricular  wall thickness. There is severely elevated pulmonary artery systolic  pressure. The estimated right ventricular  systolic pressure is 63.2 mmHg.   3. Left atrial size was moderately dilated.   4. The mitral valve is degenerative. Mild mitral valve regurgitation. No  evidence of mitral stenosis.   5. Tricuspid valve regurgitation is moderate.   6. The aortic valve is tricuspid. There is thickening of the left  coronary cusp of the aortic valve. Aortic valve regurgitation is trivial.  Aortic valve sclerosis/calcification is present, without any evidence of  aortic stenosis.   7. There is dilatation of the ascending aorta, measuring 38 mm.   8. The inferior vena cava is  normal in size with greater than 50%  respiratory variability, suggesting right atrial pressure of 3 mmHg.  Physical Exam:   VS:  BP 126/70 (BP Location: Right Arm, Patient Position: Sitting, Cuff Size: Normal)   Pulse 85   SpO2 96%    Wt Readings from Last 3 Encounters:  08/12/23 183 lb 8 oz (83.2 kg)  04/27/23 175 lb (79.4 kg)  04/14/23 160 lb (72.6 kg)    GEN: Well nourished, well developed  in no acute distress NECK: No JVD; No carotid bruits CARDIAC: RRR, no murmurs, rubs, gallops RESPIRATORY:  Clear to auscultation without rales, wheezing or rhonchi  ABDOMEN: Soft, non-tender, non-distended EXTREMITIES:  bilateral amputations  ASSESSMENT AND PLAN: .   Assessment and Plan    Atrial Flutter Atrial flutter managed with amiodarone, maintaining sinus rhythm. Ablation may be considered if rhythm control becomes challenging. - Continue amiodarone 200 mg daily. - Continue Eliquis 5 mg BID.  Coronary Artery Disease (CAD) Concerns for CAD due to coronary calcifications on chest CT and possible prior myocardial infarction suggested by echocardiogram. Differential includes hypertrophic cardiomyopathy; further evaluation required. Discussed further evaluation options, including CT scan of coronary arteries or catheterization. He prefers CT scan for its non-invasive nature. - Order coronary CTA to exclude obstructive CAD. - Perform blood test to assess kidney function prior to CTA. - Administer 100 mg metoprolol tartrate before the scan.  Heart Failure with Preserved Ejection Fraction (HFpEF) Volume status well controlled with diuretics. - Continue furosemide 40 mg daily.  Hyperlipidemia LDL cholesterol well controlled with statin therapy. - Continue rosuvastatin (Crestor) 40 mg daily.  Diabetes Mellitus Uncontrolled diabetes with recent A1c of 11.7. Management to continue under primary care supervision. - Continue current diabetes management under primary care supervision.  Hypertension Blood pressure well controlled at 126/70 mmHg.  Follow-up Return for follow-up after completion of coronary CTA and evaluations. - Schedule follow-up appointment in 6 months.              Follow-up: Return in about 6 months (around 03/11/2024).  Signed, Lenna Gilford. Flora Lipps, MD, [facility]  [facility]  [address] [place], [place] [address] [phone]  1:55  PM

## 2023-09-09 ENCOUNTER — Other Ambulatory Visit (HOSPITAL_COMMUNITY): Payer: Self-pay

## 2023-09-09 ENCOUNTER — Ambulatory Visit: Payer: Medicare Other | Attending: Cardiovascular Disease | Admitting: Cardiovascular Disease

## 2023-09-09 ENCOUNTER — Encounter: Payer: Self-pay | Admitting: Cardiovascular Disease

## 2023-09-09 VITALS — BP 126/70 | HR 85

## 2023-09-09 DIAGNOSIS — I253 Aneurysm of heart: Secondary | ICD-10-CM | POA: Diagnosis not present

## 2023-09-09 DIAGNOSIS — I2510 Atherosclerotic heart disease of native coronary artery without angina pectoris: Secondary | ICD-10-CM | POA: Diagnosis not present

## 2023-09-09 DIAGNOSIS — I10 Essential (primary) hypertension: Secondary | ICD-10-CM

## 2023-09-09 DIAGNOSIS — E782 Mixed hyperlipidemia: Secondary | ICD-10-CM

## 2023-09-09 DIAGNOSIS — I4892 Unspecified atrial flutter: Secondary | ICD-10-CM

## 2023-09-09 DIAGNOSIS — I739 Peripheral vascular disease, unspecified: Secondary | ICD-10-CM | POA: Diagnosis not present

## 2023-09-09 DIAGNOSIS — I5032 Chronic diastolic (congestive) heart failure: Secondary | ICD-10-CM

## 2023-09-09 MED ORDER — APIXABAN 5 MG PO TABS
5.0000 mg | ORAL_TABLET | Freq: Two times a day (BID) | ORAL | 0 refills | Status: AC
  Filled 2023-09-09: qty 180, 90d supply, fill #0

## 2023-09-09 MED ORDER — METOPROLOL TARTRATE 100 MG PO TABS
100.0000 mg | ORAL_TABLET | Freq: Once | ORAL | 0 refills | Status: DC
  Filled 2023-09-09: qty 1, 1d supply, fill #0

## 2023-09-09 NOTE — Patient Instructions (Signed)
 Medication Instructions:  Your physician recommends that you continue on your current medications as directed. Please refer to the Current Medication list given to you today.    *If you need a refill on your cardiac medications before your next appointment, please call your pharmacy*   Lab Work: BASIC METABOLIC PANEL    If you have labs (blood work) drawn today and your tests are completely normal, you will receive your results only by: MyChart Message (if you have MyChart) OR A paper copy in the mail If you have any lab test that is abnormal or we need to change your treatment, we will call you to review the results.   Testing/Procedures:   Your cardiac CT will be scheduled at one of the below locations:   [facility] [address] [place], [place] [address] [phone]   If scheduled at [facility], please arrive at the [facility] and Children's Entrance (Entrance C2) of [facility] 30 minutes prior to test start time. You can use the FREE valet parking offered at entrance C (encouraged to control the heart rate for the test)  Proceed to the [facility] Radiology Department (first floor) to check-in and test prep.  All radiology patients and guests should use entrance C2 at [facility], accessed from [facility], even though the hospital's physical address listed is [address].    If scheduled at [facility] or [facility], please arrive 15 mins early for check-in and test prep.  There is spacious parking and easy access to the radiology department from the [facility] entrance. Please enter here and check-in with the desk attendant.   If scheduled at [facility], please arrive 30 minutes early for check-in and test prep.  Please follow these instructions carefully (unless otherwise directed):  An IV will be required for this test and  Nitroglycerin will be given.  Hold all erectile dysfunction medications at least 3 days (72 hrs) prior to test. (Ie viagra, cialis, sildenafil, tadalafil, etc)   On the Night Before the Test: Be sure to Drink plenty of water. Do not consume any caffeinated/decaffeinated beverages or chocolate 12 hours prior to your test. Do not take any antihistamines 12 hours prior to your test.   On the Day of the Test: Drink plenty of water until 1 hour prior to the test. Do not eat any food 1 hour prior to test. You may take your regular medications prior to the test.  Take metoprolol 100 MG (Lopressor) two hours prior to test. If you take Furosemide/Hydrochlorothiazide/Spironolactone/Chlorthalidone, please HOLD on the morning of the test. Patients who wear a continuous glucose monitor MUST remove the device prior to scanning. FEMALES- please wear underwire-free bra if available, avoid dresses & tight clothing   After the Test: Drink plenty of water. After receiving IV contrast, you may experience a mild flushed feeling. This is normal. On occasion, you may experience a mild rash up to 24 hours after the test. This is not dangerous. If this occurs, you can take Benadryl 25 mg, Zyrtec, Claritin, or Allegra and increase your fluid intake. (Patients taking Tikosyn should avoid Benadryl, and may take Zyrtec, Claritin, or Allegra) If you experience trouble breathing, this can be serious. If it is severe call 911 IMMEDIATELY. If it is mild, please call our office.  We will call to schedule your test 2-4 weeks out understanding that some insurance companies will need an  authorization prior to the service being performed.   For more information and frequently asked questions, please visit our website : [email or web address]  For non-scheduling related questions, please contact the cardiac imaging nurse navigator should you have any questions/concerns: Cardiac Imaging Nurse Navigators Direct  Office Dial: [phone]   For scheduling needs, including cancellations and rescheduling, please call [place], [phone].    Follow-Up: At [facility], you and your health needs are our priority.  As part of our continuing mission to provide you with exceptional heart care, we have created designated Provider Care Teams.  These Care Teams include your primary Cardiologist (physician) and Advanced Practice Providers (APPs -  Physician Assistants and Nurse Practitioners) who all work together to provide you with the care you need, when you need it.  We recommend signing up for the patient portal called "MyChart".  Sign up information is provided on this After Visit Summary.  MyChart is used to connect with patients for Virtual Visits (Telemedicine).  Patients are able to view lab/test results, encounter notes, upcoming appointments, etc.  Non-urgent messages can be sent to your provider as well.   To learn more about what you can do with MyChart, go to [email or web address].    Your next appointment:   6 month(s)  The format for your next appointment:   In Person  Provider:   Lennie Odor, MD     Other Instructions s

## 2023-09-10 ENCOUNTER — Telehealth: Payer: Self-pay | Admitting: Physician Assistant

## 2023-09-10 LAB — BASIC METABOLIC PANEL
BUN/Creatinine Ratio: 23 (ref 10–24)
BUN: 17 mg/dL (ref 8–27)
CO2: 21 mmol/L (ref 20–29)
Calcium: 9 mg/dL (ref 8.6–10.2)
Chloride: 101 mmol/L (ref 96–106)
Creatinine, Ser: 0.73 mg/dL — ABNORMAL LOW (ref 0.76–1.27)
Glucose: 182 mg/dL — ABNORMAL HIGH (ref 70–99)
Potassium: 4 mmol/L (ref 3.5–5.2)
Sodium: 142 mmol/L (ref 134–144)
eGFR: 102 mL/min/{1.73_m2} (ref >59)

## 2023-09-10 NOTE — Telephone Encounter (Signed)
 Called by nurse Earna Coder) at [facility]. The patient's Metoprolol was sent to a local pharmacy. The patient is at [facility] long term. The Rx needs to be faxed to the facility.  I will fwd this to Dr. Marylene Buerger nurse so the Rx can be faxed to [facility] on Monday. Fax # is [phone]  Tereso Newcomer, PA-C    09/10/2023 9:32 AM

## 2023-09-11 ENCOUNTER — Encounter: Payer: Self-pay | Admitting: Cardiovascular Disease

## 2023-09-12 ENCOUNTER — Other Ambulatory Visit (HOSPITAL_COMMUNITY): Payer: Self-pay

## 2023-09-12 ENCOUNTER — Ambulatory Visit (INDEPENDENT_AMBULATORY_CARE_PROVIDER_SITE_OTHER): Admitting: Orthopedic Surgery

## 2023-09-12 DIAGNOSIS — Z89511 Acquired absence of right leg below knee: Secondary | ICD-10-CM

## 2023-09-12 MED ORDER — METOPROLOL TARTRATE 100 MG PO TABS: ORAL_TABLET | ORAL | 0 refills | Status: AC

## 2023-09-12 NOTE — Telephone Encounter (Signed)
 Patient identification verified by 2 forms. Shade Flood, RN     Prescription for metoprolol tartrate 1 time dose faxed to [facility] today

## 2023-09-15 ENCOUNTER — Telehealth: Payer: Self-pay

## 2023-09-15 NOTE — Telephone Encounter (Signed)
 [facility] calling stating patient is running out of Doxycycline, asking if we want him to continue? [phone]

## 2023-09-15 NOTE — Telephone Encounter (Signed)
 Troy [name] aware of the below message from [place]

## 2023-09-18 ENCOUNTER — Encounter: Payer: Self-pay | Admitting: Orthopedic Surgery

## 2023-09-18 NOTE — Progress Notes (Signed)
 Office Visit Note   Patient: Troy [name]           Date of Birth: July 14, 1960           MRN: [id number] Visit Date: 09/12/2023              Requested by: Rebecka Apley, NP [address] [place],  [place] [address] PCP: Rebecka Apley, NP  Chief Complaint  Patient presents with   Right Leg - Follow-up    03/11/2023 right BKA       HPI: Patient is a 63 year old gentleman who is 6 months status post right below-knee amputation.  Assessment & Plan: Visit Diagnoses:  1. Right below-knee amputee [facility])     Plan: Continue with packing the wounds open with iodoform gauze.  Follow-up with [facility] for his prosthetic fitting.  Follow-Up Instructions: Return in about 4 weeks (around 10/10/2023).   Ortho Exam  Patient is alert, oriented, no adenopathy, well-dressed, normal affect, normal respiratory effort. Examination patient has a small area of hypergranulation tissue that is touched with silver nitrate.  Laterally he has a 3 mm wound that is currently packed open with iodoform.  There is no redness no drainage no tenderness to palpation.  Imaging: No results found. No images are attached to the encounter.  Labs: Lab Results  Component Value Date   HGBA1C 11.7 (H) 02/05/2023   HGBA1C 10.8 (H) 08/18/2021   HGBA1C 12.3 (H) 01/21/2021   ESRSEDRATE 110 (H) 02/13/2023   ESRSEDRATE 56 (H) 02/07/2023   CRP 13.0 (H) 02/07/2023   CRP 22.7 (H) 08/20/2021   CRP 23.2 (H) 08/20/2021   REPTSTATUS 04/07/2023 FINAL 04/02/2023   GRAMSTAIN  03/11/2023    ABUNDANT WBC PRESENT, PREDOMINANTLY PMN RARE GRAM POSITIVE COCCI IN PAIRS    CULT  04/02/2023    NO GROWTH 5 DAYS Performed at [facility], [address] [place], [place] [address]    LABORGA METHICILLIN RESISTANT STAPHYLOCOCCUS AUREUS 03/11/2023     Lab Results  Component Value Date   ALBUMIN 3.9 06/08/2023   ALBUMIN 3.9 04/27/2023   ALBUMIN 2.2 (L) 03/23/2023   PREALBUMIN 5 (L) 03/11/2023     Lab Results  Component Value Date   MG 1.7 03/18/2023   MG 1.6 (L) 02/13/2023   MG 1.8 02/12/2023   No results found for: "VD25OH"  Lab Results  Component Value Date   PREALBUMIN 5 (L) 03/11/2023      Latest Ref Rng & Units 04/08/2023    7:47 AM 04/07/2023    5:23 AM 04/04/2023    3:07 AM  CBC EXTENDED  WBC 4.0 - 10.5 K/uL 11.2  13.0  16.3   RBC 4.22 - 5.81 MIL/uL 4.58  4.46  4.34   Hemoglobin 13.0 - 17.0 g/dL 47.4  25.9  56.3   HCT 39.0 - 52.0 % 35.2  35.3  34.6   Platelets 150 - 400 K/uL 385  399  404   NEUT# 1.7 - 7.7 K/uL 6.8  8.5    Lymph# 0.7 - 4.0 K/uL 3.0  3.0       There is no height or weight on file to calculate BMI.  Orders:  No orders of the defined types were placed in this encounter.  No orders of the defined types were placed in this encounter.    Procedures: No procedures performed  Clinical Data: No additional findings.  ROS:  All other systems negative, except as noted in the  HPI. Review of Systems  Objective: Vital Signs: There were no vitals taken for this visit.  Specialty Comments:  No specialty comments available.  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   (HFpEF) heart failure with preserved ejection fraction (HCC) 04/02/2023   Lactic acidosis 04/02/2023   Acute respiratory failure with hypoxia (HCC) 04/02/2023   Leukocytosis 04/02/2023   Uncontrolled type 2 diabetes mellitus with hyperglycemia, with long-term current use of insulin (HCC) 04/02/2023   Hypochromic anemia 04/02/2023   PVD (peripheral vascular disease) (HCC) 04/02/2023   Thrombocytosis 04/02/2023   Respiratory distress 03/21/2023   Osteomyelitis of ankle or foot, acute, right (HCC) 03/11/2023   Abscess of right lower leg 03/11/2023   Below-knee amputation of right lower extremity (HCC) 03/11/2023   Chronic respiratory failure with hypoxia (HCC) 02/12/2023   Pulmonary  infiltrates 02/12/2023   Acute on chronic heart failure with preserved ejection fraction (HFpEF) (HCC) 02/12/2023   Chronic osteomyelitis of right foot with draining sinus (HCC) 02/07/2023   Paroxysmal atrial flutter (HCC) 02/05/2023   Hypotension 02/05/2023   Atrial fibrillation with RVR (HCC) 02/04/2023   Atrial flutter (HCC) 02/04/2023   Lower limb ischemia: ??? 08/22/2021   Diabetic acidosis without coma (HCC)    Hypokalemia    Hypomagnesemia    Hyponatremia    Multifocal pneumonia    Parapneumonic effusion 08/18/2021   Bowel Ileus (HCC) 08/18/2021   ?? 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   Ketoacidosis due to type 2 diabetes mellitus (HCC) 08/15/2021   COVID-19 virus infection 08/15/2021   S/P AKA (above knee amputation), left (HCC) 02/12/2021   Amputation of right great toe and 2nd Toe 02/12/2021   Sepsis due to left-sided neck cellulitis/MRSA 02/12/2021   Hypotensive episode    Elevated troponin    SIRS (systemic inflammatory response syndrome) (HCC) 02/11/2021   Abscess of bursa of left ankle 01/21/2021   Dehiscence of amputation stump (HCC)    Ischemia of left BKA site (HCC)    Abscess of leg without foot, left    Osteomyelitis of second toe of right foot (HCC) 07/12/2019   Below knee amputation status, left 01/25/2018   Wound dehiscence, surgical    Status post left foot surgery 12/15/2017   GERD (gastroesophageal reflux disease) 09/26/2017   Hyperlipidemia associated with type 2 diabetes mellitus (HCC) 07/08/2017   Snoring 06/07/2016   Chest tube in place 01/22/2016   Abnormal nuclear stress test 01/22/2016   Pain, chronic due to trauma 07/04/2012   Complex regional pain syndrome I of lower limb 07/04/2012   COPD (chronic obstructive pulmonary disease) (HCC) 01/27/2011   Pre-operative cardiovascular examination 01/27/2011   Nonspecific abnormal electrocardiogram (ECG) (EKG) 01/27/2011   Murmur 01/27/2011   DM2 (diabetes mellitus, type 2) (HCC)    HTN  (hypertension)    HLD (hyperlipidemia)    Tobacco abuse    Past Medical History:  Diagnosis Date   Acute renal failure (HCC)    in setting of NSAID use and orthopedic surgery 2010   Anxiety and depression    Chronic diastolic CHF (congestive heart failure) (HCC)    a. Echo 6/17: severe conc LVH, vigorous EF, EF 65-70%, no dynamic obstruction, no RWMA, Gr 1 DD, mild TR  //  b. LHC 8/17: no sig CAD, LVEDP 28   Cirrhosis of liver (HCC)    COPD (chronic obstructive pulmonary disease) (HCC)    Diabetic ulcer of left foot (  HCC)    DM2 (diabetes mellitus, type 2) (HCC)    Dysrhythmia    Family history of early CAD    GERD (gastroesophageal reflux disease)    History of amputation of foot (HCC)    L trans-met // R toe   History of cardiac catheterization    a. LHC 2002: irregs  //  b. LHC in 8/17: no sig CAD, apical DK, hyperdynamic LV, LVEDP 28   History of kidney stones    History of nuclear stress test    a. Nuc 7/17: Overall, intermediate risk nuclear stress test secondary to small size of apical lateral defect and reduced ejection fraction.  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    Kidney calculi    Palpitations    Pneumonia    PTSD (post-traumatic stress disorder)    Tobacco abuse    Transient ischemic attack     Family History  Problem Relation Age of Onset   Leukemia Mother 74       died   Lung cancer Father 52       died   Heart attack Brother 54   Heart attack Brother 101   Hypertension Brother        X3   Hypertension Sister        X2   Diabetes Sister    Stroke Sister    Diabetes Sister    Other Brother        Set designer accident    Past Surgical History:  Procedure Laterality Date   ABDOMINAL AORTOGRAM W/LOWER EXTREMITY N/A 01/31/2023   Procedure: ABDOMINAL AORTOGRAM W/LOWER EXTREMITY;  Surgeon: Maeola Harman, MD;  Location: [facility] INVASIVE  CV LAB;  Service: Cardiovascular;  Laterality: N/A;   AMPUTATION Left 01/03/2018   Procedure: LEFT MIDFOOT AMPUTATION/REVISION MIDAMPUTAION;  Surgeon: Kathryne Hitch, MD;  Location: [facility] OR;  Service: Orthopedics;  Laterality: Left;   AMPUTATION Left 01/25/2018   Procedure: LEFT BELOW KNEE AMPUTATION;  Surgeon: Nadara Mustard, MD;  Location: [facility] OR;  Service: Orthopedics;  Laterality: Left;   AMPUTATION Left 01/21/2021   Procedure: LEFT ABOVE KNEE AMPUTATION;  Surgeon: Nadara Mustard, MD;  Location: [facility] OR;  Service: Orthopedics;  Laterality: Left;   AMPUTATION Right 02/09/2023   Procedure: RIGHT TRANSMETATARSAL AMPUTATION;  Surgeon: Nadara Mustard, MD;  Location: [facility] OR;  Service: Orthopedics;  Laterality: Right;   AMPUTATION Right 03/11/2023   Procedure: RIGHT BELOW KNEE AMPUTATION;  Surgeon: Nadara Mustard, MD;  Location: [facility] OR;  Service: Orthopedics;  Laterality: Right;   AMPUTATION TOE Right 07/17/2019   Procedure: AMPUTATION RIGHT FOOT 2ND TOE;  Surgeon: Kathryne Hitch, MD;  Location: [facility];  Service: Orthopedics;  Laterality: Right;   APPLICATION OF WOUND VAC Left 01/21/2021   Procedure: APPLICATION OF WOUND VAC;  Surgeon: Nadara Mustard, MD;  Location: [facility] OR;  Service: Orthopedics;  Laterality: Left;   BELOW KNEE LEG AMPUTATION Left 01/25/2018   CARDIAC CATHETERIZATION N/A 01/22/2016   Procedure: Left Heart Cath and Coronary Angiography;  Surgeon: Peter M Swaziland, MD;  Location: [facility] INVASIVE CV LAB;  Service: Cardiovascular;  Laterality: N/A;   CARDIOVERSION N/A 03/17/2023   Procedure: CARDIOVERSION;  Surgeon: Chrystie Nose, MD;  Location: [facility] INVASIVE CV LAB;  Service: Cardiovascular;  Laterality: N/A;   FOOT AMPUTATION  Bilateral    I & D EXTREMITY Left 12/15/2017   Procedure: IRRIGATION AND DEBRIDEMENT LEFT FOOT ULCER;  Surgeon: Kathryne Hitch, MD;  Location: [facility];  Service: Orthopedics;  Laterality: Left;   I & D EXTREMITY Left 07/25/2020   Procedure:  LEFT BELOW KNEE AMPUTATION ABSCESS EXCISION AND SKIN GRAFT;  Surgeon: Nadara Mustard, MD;  Location: [facility];  Service: Orthopedics;  Laterality: Left;   I & D EXTREMITY Left 08/22/2020   Procedure: DEBRIDEMENT LEFT BELOW KNEE AMPUTATION AND APPLY KERECIS SKIN GRAFT;  Surgeon: Nadara Mustard, MD;  Location: [facility] OR;  Service: Orthopedics;  Laterality: Left;   LITHOTRIPSY     PERIPHERAL VASCULAR INTERVENTION  01/31/2023   Procedure: PERIPHERAL VASCULAR INTERVENTION;  Surgeon: Maeola Harman, MD;  Location: [facility] INVASIVE CV LAB;  Service: Cardiovascular;;   TEE WITHOUT CARDIOVERSION N/A 03/17/2023   Procedure: TRANSESOPHAGEAL ECHOCARDIOGRAM;  Surgeon: Chrystie Nose, MD;  Location: [facility] INVASIVE CV LAB;  Service: Cardiovascular;  Laterality: N/A;   TENDON LENGTHENING Bilateral    calf   TONSILLECTOMY     Social History   Occupational History   Occupation: DISABLED  Tobacco Use   Smoking status: Former    Current packs/day: 1.00    Average packs/day: 1 pack/day for 40.0 years (40.0 ttl pk-yrs)    Types: Cigarettes   Smokeless tobacco: Never  Vaping Use   Vaping status: Never Used  Substance and Sexual Activity   Alcohol use: No   Drug use: No   Sexual activity: Not on file

## 2023-09-21 ENCOUNTER — Other Ambulatory Visit (HOSPITAL_COMMUNITY): Payer: Self-pay

## 2023-09-23 ENCOUNTER — Telehealth (HOSPITAL_COMMUNITY): Payer: Self-pay | Admitting: *Deleted

## 2023-09-23 NOTE — Telephone Encounter (Signed)
 Per request, attempted to call patient's nursing home and speak with Shanda Bumps regarding upcoming cardiac CT appointment. Left message on voicemail with name and callback number  Larey Brick RN Navigator Cardiac Imaging [facility] [phone] Office [phone] Cell

## 2023-09-26 ENCOUNTER — Ambulatory Visit (HOSPITAL_COMMUNITY)
Admission: RE | Admit: 2023-09-26 | Discharge: 2023-09-26 | Disposition: A | Discharge status: Home / Self Care | Source: Ambulatory Visit | Attending: Cardiovascular Disease | Admitting: Cardiovascular Disease

## 2023-09-26 ENCOUNTER — Ambulatory Visit (HOSPITAL_BASED_OUTPATIENT_CLINIC_OR_DEPARTMENT_OTHER)
Admission: RE | Admit: 2023-09-26 | Discharge: 2023-09-26 | Disposition: A | Discharge status: Home / Self Care | Source: Ambulatory Visit | Attending: Internal Medicine | Admitting: Internal Medicine

## 2023-09-26 VITALS — BP 132/65 | Resp 11

## 2023-09-26 DIAGNOSIS — I2510 Atherosclerotic heart disease of native coronary artery without angina pectoris: Secondary | ICD-10-CM | POA: Diagnosis not present

## 2023-09-26 DIAGNOSIS — I253 Aneurysm of heart: Secondary | ICD-10-CM | POA: Insufficient documentation

## 2023-09-26 DIAGNOSIS — I517 Cardiomegaly: Secondary | ICD-10-CM | POA: Insufficient documentation

## 2023-09-26 DIAGNOSIS — R931 Abnormal findings on diagnostic imaging of heart and coronary circulation: Secondary | ICD-10-CM | POA: Diagnosis not present

## 2023-09-26 DIAGNOSIS — R918 Other nonspecific abnormal finding of lung field: Secondary | ICD-10-CM | POA: Diagnosis not present

## 2023-09-26 MED ORDER — IOHEXOL 350 MG/ML SOLN
95.0000 mL | Freq: Once | INTRAVENOUS | Status: AC | PRN
  Administered 2023-09-26: 95 mL via INTRAVENOUS

## 2023-09-26 MED ORDER — NITROGLYCERIN 0.4 MG SL SUBL
SUBLINGUAL_TABLET | SUBLINGUAL | Status: AC
  Filled 2023-09-26: qty 2

## 2023-09-26 MED ORDER — NITROGLYCERIN 0.4 MG SL SUBL
0.8000 mg | SUBLINGUAL_TABLET | Freq: Once | SUBLINGUAL | Status: AC
  Administered 2023-09-26: 0.8 mg via SUBLINGUAL

## 2023-09-28 ENCOUNTER — Telehealth: Payer: Self-pay | Admitting: Cardiovascular Disease

## 2023-09-28 NOTE — Telephone Encounter (Signed)
 Patient identification verified by 2 forms. Shade Flood, RN     Called and spoke to patient's wife [facility].  JANET states:  - A nurse called her and told her that based off the patient's CT results from 4/7 the patient needed to scheduled an OV.               Interventions/Plan: - Chart review shows CT scan results have not yet been reviewed by the provider. No recommendations or changes to provide patient at this time.  - Informed [name] Dr. Lovena Neighbours nurse will contact the patient with the final results.   [name] agrees with plan, no further questions at this time

## 2023-09-28 NOTE — Telephone Encounter (Signed)
 Wife is calling on about patient results, states patient needs to be seen sooner. Please advise

## 2023-10-04 ENCOUNTER — Encounter: Payer: Self-pay | Admitting: Cardiovascular Disease

## 2023-10-10 ENCOUNTER — Ambulatory Visit (INDEPENDENT_AMBULATORY_CARE_PROVIDER_SITE_OTHER): Admitting: Orthopedic Surgery

## 2023-10-10 DIAGNOSIS — L97211 Non-pressure chronic ulcer of right calf limited to breakdown of skin: Secondary | ICD-10-CM | POA: Diagnosis not present

## 2023-10-10 DIAGNOSIS — Z89511 Acquired absence of right leg below knee: Secondary | ICD-10-CM

## 2023-10-20 ENCOUNTER — Telehealth: Payer: Self-pay | Admitting: Orthopedic Surgery

## 2023-10-20 ENCOUNTER — Encounter: Payer: Self-pay | Admitting: Orthopedic Surgery

## 2023-10-20 ENCOUNTER — Ambulatory Visit (INDEPENDENT_AMBULATORY_CARE_PROVIDER_SITE_OTHER): Admitting: Orthopedic Surgery

## 2023-10-20 DIAGNOSIS — Z89511 Acquired absence of right leg below knee: Secondary | ICD-10-CM | POA: Diagnosis not present

## 2023-10-20 DIAGNOSIS — Z89612 Acquired absence of left leg above knee: Secondary | ICD-10-CM | POA: Diagnosis not present

## 2023-10-20 NOTE — Progress Notes (Signed)
 Office Visit Note   Patient: Troy [name]           Date of Birth: 1960/11/02           MRN: [id number] Visit Date: 10/20/2023              Requested by: Fonda Hymen, NP [address] [place],  [place] [address] PCP: Fonda Hymen, NP  Chief Complaint  Patient presents with   Right Leg - Wound Check    Hx Right BKA      HPI: Patient is a 63 year old gentleman is status post right below-knee amputation on September 2024.  Patient is also status post left above-knee amputation.  Patient states he still has an open ulcer over the residual limb on the right.  Assessment & Plan: Visit Diagnoses:  1. Right below-knee amputee (HCC)   2. S/P AKA (above knee amputation), left (HCC)     Plan: A prescription is sent with him to his skilled facility for doxycycline  for 1 month.  Continue packing the wound open.  Follow-Up Instructions: Return in about 4 weeks (around 11/17/2023).   Ortho Exam  Patient is alert, oriented, no adenopathy, well-dressed, normal affect, normal respiratory effort. Examination the left above-knee amputation is well-healed.  Examination of right below-knee amputation there is no cellulitis no purulent drainage.  He does have a small wound laterally that is 3 mm in diameter and probes 3 cm deep.  There is clear serosanguineous drainage without purulence.  Imaging: No results found. No images are attached to the encounter.  Labs: Lab Results  Component Value Date   HGBA1C 11.7 (H) 02/05/2023   HGBA1C 10.8 (H) 08/18/2021   HGBA1C 12.3 (H) 01/21/2021   ESRSEDRATE 110 (H) 02/13/2023   ESRSEDRATE 56 (H) 02/07/2023   CRP 13.0 (H) 02/07/2023   CRP 22.7 (H) 08/20/2021   CRP 23.2 (H) 08/20/2021   REPTSTATUS 04/07/2023 FINAL 04/02/2023   GRAMSTAIN  03/11/2023    ABUNDANT WBC PRESENT, PREDOMINANTLY PMN RARE GRAM POSITIVE COCCI IN PAIRS    CULT  04/02/2023    NO GROWTH 5 DAYS Performed at [facility], [address], [place], [place] [address]    LABORGA METHICILLIN RESISTANT STAPHYLOCOCCUS AUREUS 03/11/2023     Lab Results  Component Value Date   ALBUMIN 3.9 06/08/2023   ALBUMIN 3.9 04/27/2023   ALBUMIN 2.2 (L) 03/23/2023   PREALBUMIN 5 (L) 03/11/2023    Lab Results  Component Value Date   MG 1.7 03/18/2023   MG 1.6 (L) 02/13/2023   MG 1.8 02/12/2023   No results found for: "VD25OH"  Lab Results  Component Value Date   PREALBUMIN 5 (L) 03/11/2023      Latest Ref Rng & Units 04/08/2023    7:47 AM 04/07/2023    5:23 AM 04/04/2023    3:07 AM  CBC EXTENDED  WBC 4.0 - 10.5 K/uL 11.2  13.0  16.3   RBC 4.22 - 5.81 MIL/uL 4.58  4.46  4.34   Hemoglobin 13.0 - 17.0 g/dL 62.1  30.8  65.7   HCT 39.0 - 52.0 % 35.2  35.3  34.6   Platelets 150 - 400 K/uL 385  399  404   NEUT# 1.7 - 7.7 K/uL 6.8  8.5    Lymph# 0.7 - 4.0 K/uL 3.0  3.0       There is no height or weight on file to calculate BMI.  Orders:  No orders of  the defined types were placed in this encounter.  No orders of the defined types were placed in this encounter.    Procedures: No procedures performed  Clinical Data: No additional findings.  ROS:  All other systems negative, except as noted in the HPI. Review of Systems  Objective: Vital Signs: There were no vitals taken for this visit.  Specialty Comments:  No specialty comments available.  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   (HFpEF) heart failure with preserved ejection fraction (HCC) 04/02/2023   Lactic acidosis 04/02/2023   Acute respiratory failure with hypoxia (HCC) 04/02/2023   Leukocytosis 04/02/2023   Uncontrolled type 2 diabetes mellitus with hyperglycemia, with long-term current use of insulin  (HCC) 04/02/2023   Hypochromic anemia 04/02/2023   PVD (peripheral vascular disease) (HCC) 04/02/2023   Thrombocytosis 04/02/2023   Respiratory distress  03/21/2023   Osteomyelitis of ankle or foot, acute, right (HCC) 03/11/2023   Abscess of right lower leg 03/11/2023   Below-knee amputation of right lower extremity (HCC) 03/11/2023   Chronic respiratory failure with hypoxia (HCC) 02/12/2023   Pulmonary infiltrates 02/12/2023   Acute on chronic heart failure with preserved ejection fraction (HFpEF) (HCC) 02/12/2023   Chronic osteomyelitis of right foot with draining sinus (HCC) 02/07/2023   Paroxysmal atrial flutter (HCC) 02/05/2023   Hypotension 02/05/2023   Atrial fibrillation with RVR (HCC) 02/04/2023   Atrial flutter (HCC) 02/04/2023   Lower limb ischemia: ??? 08/22/2021   Diabetic acidosis without coma (HCC)    Hypokalemia    Hypomagnesemia    Hyponatremia    Multifocal pneumonia    Parapneumonic effusion 08/18/2021   Bowel Ileus (HCC) 08/18/2021   ?? 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   Ketoacidosis due to type 2 diabetes mellitus (HCC) 08/15/2021   COVID-19 virus infection 08/15/2021   S/P AKA (above knee amputation), left (HCC) 02/12/2021   Amputation of right great toe and 2nd Toe 02/12/2021   Sepsis due to left-sided neck cellulitis/MRSA 02/12/2021   Hypotensive episode    Elevated troponin    SIRS (systemic inflammatory response syndrome) (HCC) 02/11/2021   Abscess of bursa of left ankle 01/21/2021   Dehiscence of amputation stump (HCC)    Ischemia of left BKA site (HCC)    Abscess of leg without foot, left    Osteomyelitis of second toe of right foot (HCC) 07/12/2019   Below knee amputation status, left 01/25/2018   Wound dehiscence, surgical    Status post left foot surgery 12/15/2017   GERD (gastroesophageal reflux disease) 09/26/2017   Hyperlipidemia associated with type 2 diabetes mellitus (HCC) 07/08/2017   Snoring 06/07/2016   Chest tube in place 01/22/2016   Abnormal nuclear stress test 01/22/2016   Pain, chronic due to trauma 07/04/2012   Complex regional pain syndrome I of lower limb 07/04/2012    COPD (chronic obstructive pulmonary disease) (HCC) 01/27/2011   Pre-operative cardiovascular examination 01/27/2011   Nonspecific abnormal electrocardiogram (ECG) (EKG) 01/27/2011   Murmur 01/27/2011   DM2 (diabetes mellitus, type 2) (HCC)    HTN (hypertension)    HLD (hyperlipidemia)    Tobacco abuse    Past Medical History:  Diagnosis Date   Acute renal failure (HCC)    in setting of NSAID use and orthopedic surgery 2010   Anxiety and depression    Chronic diastolic CHF (congestive heart failure) (HCC)    a. Echo 6/17: severe conc LVH, vigorous EF, EF  65-70%, no dynamic obstruction, no RWMA, Gr 1 DD, mild TR  //  b. 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 LHC 2002: irregs  //  b. LHC in 8/17: no sig CAD, apical DK, hyperdynamic LV, LVEDP 28   History of kidney stones    History of nuclear stress test    a. Nuc 7/17: Overall, intermediate risk nuclear stress test secondary to small size of apical lateral defect and reduced ejection fraction.  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    Kidney calculi    Palpitations    Pneumonia    PTSD (post-traumatic stress disorder)    Tobacco abuse    Transient ischemic attack     Family History  Problem Relation Age of Onset   Leukemia Mother 57       died   Lung cancer Father 60       died   Heart attack Brother 44   Heart attack Brother 68   Hypertension Brother        X3   Hypertension Sister        X2   Diabetes Sister    Stroke Sister    Diabetes Sister    Other  Brother        Set designer accident    Past Surgical History:  Procedure Laterality Date   ABDOMINAL AORTOGRAM W/LOWER EXTREMITY N/A 01/31/2023   Procedure: ABDOMINAL AORTOGRAM W/LOWER EXTREMITY;  Surgeon: Adine Hoof, MD;  Location: [facility] INVASIVE CV LAB;  Service: Cardiovascular;  Laterality: N/A;   AMPUTATION Left 01/03/2018   Procedure: LEFT MIDFOOT AMPUTATION/REVISION MIDAMPUTAION;  Surgeon: Arnie Lao, MD;  Location[facility] OR;  Service: Orthopedics;  Laterality: Left;   AMPUTATION Left 01/25/2018   Procedure: LEFT BELOW KNEE AMPUTATION;  Surgeon: Timothy Ford, MD;  Location: [facility] OR;  Service: Orthopedics;  Laterality: Left;   AMPUTATION Left 01/21/2021   Procedure: LEFT ABOVE KNEE AMPUTATION;  Surgeon: Timothy Ford, MD;  Location: [facility] OR;  Service: Orthopedics;  Laterality: Left;   AMPUTATION Right 02/09/2023   Procedure: RIGHT TRANSMETATARSAL AMPUTATION;  Surgeon: Timothy Ford, MD;  Location: [facility] OR;  Service: Orthopedics;  Laterality: Right;   AMPUTATION Right 03/11/2023   Procedure: RIGHT BELOW KNEE AMPUTATION;  Surgeon: Timothy Ford, MD;  Location: [facility] OR;  Service: Orthopedics;  Laterality: Right;   AMPUTATION TOE Right 07/17/2019   Procedure: AMPUTATION RIGHT FOOT 2ND TOE;  Surgeon: Arnie Lao, MD;  Location: [facility];  Service: Orthopedics;  Laterality: Right;   APPLICATION OF WOUND VAC Left 01/21/2021   Procedure: APPLICATION OF WOUND VAC;  Surgeon: Timothy Ford, MD;  Location[facility];  Service: Orthopedics;  Laterality: Left;   BELOW KNEE LEG AMPUTATION Left 01/25/2018   CARDIAC CATHETERIZATION N/A 01/22/2016   Procedure: Left Heart Cath and Coronary Angiography;  Surgeon: Hubert Madden  Swaziland, MD;  Location: [facility] INVASIVE CV LAB;  Service: Cardiovascular;  Laterality: N/A;   CARDIOVERSION N/A 03/17/2023   Procedure: CARDIOVERSION;  Surgeon: Hazle Lites, MD;  Location: [facility] INVASIVE CV LAB;  Service: Cardiovascular;  Laterality: N/A;   FOOT  AMPUTATION Bilateral    I & D EXTREMITY Left 12/15/2017   Procedure: IRRIGATION AND DEBRIDEMENT LEFT FOOT ULCER;  Surgeon: Arnie Lao, MD;  Location: [facility];  Service: Orthopedics;  Laterality: Left;   I & D EXTREMITY Left 07/25/2020   Procedure: LEFT BELOW KNEE AMPUTATION ABSCESS EXCISION AND SKIN GRAFT;  Surgeon: Timothy Ford, MD;  Location: [facility];  Service: Orthopedics;  Laterality: Left;   I & D EXTREMITY Left 08/22/2020   Procedure: DEBRIDEMENT LEFT BELOW KNEE AMPUTATION AND APPLY KERECIS SKIN GRAFT;  Surgeon: Timothy Ford, MD;  Location: [facility];  Service: Orthopedics;  Laterality: Left;   LITHOTRIPSY     PERIPHERAL VASCULAR INTERVENTION  01/31/2023   Procedure: PERIPHERAL VASCULAR INTERVENTION;  Surgeon: Adine Hoof, MD;  Location: [facility] INVASIVE CV LAB;  Service: Cardiovascular;;   TEE WITHOUT CARDIOVERSION N/A 03/17/2023   Procedure: TRANSESOPHAGEAL ECHOCARDIOGRAM;  Surgeon: Hazle Lites, MD;  Location: [facility] INVASIVE CV LAB;  Service: Cardiovascular;  Laterality: N/A;   TENDON LENGTHENING Bilateral    calf   TONSILLECTOMY     Social History   Occupational History   Occupation: DISABLED  Tobacco Use   Smoking status: Former    Current packs/day: 1.00    Average packs/day: 1 pack/day for 40.0 years (40.0 ttl pk-yrs)    Types: Cigarettes   Smokeless tobacco: Never  Vaping Use   Vaping status: Never Used  Substance and Sexual Activity   Alcohol use: No   Drug use: No   Sexual activity: Not on file

## 2023-10-20 NOTE — Telephone Encounter (Signed)
 This pt was here for an appt today. Please see message below and advise.

## 2023-10-20 NOTE — Telephone Encounter (Signed)
 Patient wife called and said when he comes to his appointment she can't be here and if you could call her to let her what to do with him please. CB#[phone]

## 2023-11-01 ENCOUNTER — Encounter: Payer: Self-pay | Admitting: Orthopedic Surgery
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
## 2023-11-17 ENCOUNTER — Encounter: Payer: Self-pay | Admitting: Orthopedic Surgery

## 2023-11-17 ENCOUNTER — Ambulatory Visit: Admitting: Orthopedic Surgery

## 2023-11-17 DIAGNOSIS — Z89511 Acquired absence of right leg below knee: Secondary | ICD-10-CM | POA: Diagnosis not present

## 2023-11-17 DIAGNOSIS — Z89612 Acquired absence of left leg above knee: Secondary | ICD-10-CM
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
## 2023-12-15 ENCOUNTER — Ambulatory Visit (INDEPENDENT_AMBULATORY_CARE_PROVIDER_SITE_OTHER): Admitting: Orthopedic Surgery

## 2023-12-15 DIAGNOSIS — Z89511 Acquired absence of right leg below knee: Secondary | ICD-10-CM | POA: Diagnosis not present

## 2023-12-15 DIAGNOSIS — Z89612 Acquired absence of left leg above knee: Secondary | ICD-10-CM | POA: Diagnosis not present

## 2023-12-15 DIAGNOSIS — T8781 Dehiscence of amputation stump: Secondary | ICD-10-CM
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
## 2023-12-19 ENCOUNTER — Encounter: Payer: Self-pay | Admitting: Orthopedic Surgery
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
## 2024-01-09 ENCOUNTER — Ambulatory Visit (INDEPENDENT_AMBULATORY_CARE_PROVIDER_SITE_OTHER): Admitting: Orthopedic Surgery

## 2024-01-09 DIAGNOSIS — Z89612 Acquired absence of left leg above knee: Secondary | ICD-10-CM | POA: Diagnosis not present

## 2024-01-09 DIAGNOSIS — Z89511 Acquired absence of right leg below knee: Secondary | ICD-10-CM | POA: Diagnosis not present

## 2024-01-10 ENCOUNTER — Encounter: Payer: Self-pay | Admitting: Orthopedic Surgery

## 2024-01-10 NOTE — Progress Notes (Signed)
 Office Visit Note   Patient: Troy [name]           Date of Birth: 1961/01/08           MRN: [id number] Visit Date: 01/09/2024              Requested by: Baird Comer GAILS, NP [address] [place],  [place] [address] PCP: Baird Comer GAILS, NP  Chief Complaint  Patient presents with   Right Leg - Follow-up      HPI: Patient is a 63 year old gentleman who is seen in follow-up for right transtibial amputation.  Patient is 10 months out from surgery.  Patient states he does not have full extension to his knee.  Patient is currently at skilled nursing.  Assessment & Plan: Visit Diagnoses:  1. Right below-knee amputee (HCC)   2. S/P AKA (above knee amputation), left (HCC)     Plan: Recommended therapy continue work on knee extension.  Patient is provided prescription follow-up with [facility] for prosthetic fitting.  Follow-Up Instructions: No follow-ups on file.   Ortho Exam  Patient is alert, oriented, no adenopathy, well-dressed, normal affect, normal respiratory effort. Examination patient has a small area of hypergranulation tissue lateral right below-knee amputation.  This was touched with silver  nitrate.  There is no cellulitis or drainage.  Patient lacks about 20 degrees to full extension right knee.    Imaging: No results found. No images are attached to the encounter.  Labs: Lab Results  Component Value Date   HGBA1C 11.7 (H) 02/05/2023   HGBA1C 10.8 (H) 08/18/2021   HGBA1C 12.3 (H) 01/21/2021   ESRSEDRATE 110 (H) 02/13/2023   ESRSEDRATE 56 (H) 02/07/2023   CRP 13.0 (H) 02/07/2023   CRP 22.7 (H) 08/20/2021   CRP 23.2 (H) 08/20/2021   REPTSTATUS 04/07/2023 FINAL 04/02/2023   GRAMSTAIN  03/11/2023    ABUNDANT WBC PRESENT, PREDOMINANTLY PMN RARE GRAM POSITIVE COCCI IN PAIRS    CULT  04/02/2023    NO GROWTH 5 DAYS Performed at [facility], [address], [place], [place] [address]    LABORGA METHICILLIN RESISTANT  STAPHYLOCOCCUS AUREUS 03/11/2023     Lab Results  Component Value Date   ALBUMIN 3.9 06/08/2023   ALBUMIN 3.9 04/27/2023   ALBUMIN 2.2 (L) 03/23/2023   PREALBUMIN 5 (L) 03/11/2023    Lab Results  Component Value Date   MG 1.7 03/18/2023   MG 1.6 (L) 02/13/2023   MG 1.8 02/12/2023   No results found for: [facility]  Lab Results  Component Value Date   PREALBUMIN 5 (L) 03/11/2023      Latest Ref Rng & Units 04/08/2023    7:47 AM 04/07/2023    5:23 AM 04/04/2023    3:07 AM  CBC EXTENDED  WBC 4.0 - 10.5 K/uL 11.2  13.0  16.3   RBC 4.22 - 5.81 MIL/uL 4.58  4.46  4.34   Hemoglobin 13.0 - 17.0 g/dL 89.3  89.3  89.8   HCT 39.0 - 52.0 % 35.2  35.3  34.6   Platelets 150 - 400 K/uL 385  399  404   NEUT# 1.7 - 7.7 K/uL 6.8  8.5    Lymph# 0.7 - 4.0 K/uL 3.0  3.0       There is no height or weight on file to calculate BMI.  Orders:  No orders of the defined types were placed in this encounter.  No orders of the defined types were placed in  this encounter.    Procedures: No procedures performed  Clinical Data: No additional findings.  ROS:  All other systems negative, except as noted in the HPI. Review of Systems  Objective: Vital Signs: There were no vitals taken for this visit.  Specialty Comments:  No specialty comments available.  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   (HFpEF) heart failure with preserved ejection fraction (HCC) 04/02/2023   Lactic acidosis 04/02/2023   Acute respiratory failure with hypoxia (HCC) 04/02/2023   Leukocytosis 04/02/2023   Uncontrolled type 2 diabetes mellitus with hyperglycemia, with long-term current use of insulin  (HCC) 04/02/2023   Hypochromic anemia 04/02/2023   PVD (peripheral vascular disease) (HCC) 04/02/2023   Thrombocytosis 04/02/2023   Respiratory distress 03/21/2023   Osteomyelitis of ankle or foot, acute, right (HCC)  03/11/2023   Abscess of right lower leg 03/11/2023   Below-knee amputation of right lower extremity (HCC) 03/11/2023   Chronic respiratory failure with hypoxia (HCC) 02/12/2023   Pulmonary infiltrates 02/12/2023   Acute on chronic heart failure with preserved ejection fraction (HFpEF) (HCC) 02/12/2023   Chronic osteomyelitis of right foot with draining sinus (HCC) 02/07/2023   Paroxysmal atrial flutter (HCC) 02/05/2023   Hypotension 02/05/2023   Atrial fibrillation with RVR (HCC) 02/04/2023   Atrial flutter (HCC) 02/04/2023   Lower limb ischemia: ??? 08/22/2021   Diabetic acidosis without coma (HCC)    Hypokalemia    Hypomagnesemia    Hyponatremia    Multifocal pneumonia    Parapneumonic effusion 08/18/2021   Bowel Ileus (HCC) 08/18/2021   ?? 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   Ketoacidosis due to type 2 diabetes mellitus (HCC) 08/15/2021   COVID-19 virus infection 08/15/2021   S/P AKA (above knee amputation), left (HCC) 02/12/2021   Amputation of right great toe and 2nd Toe 02/12/2021   Sepsis due to left-sided neck cellulitis/MRSA 02/12/2021   Hypotensive episode    Elevated troponin    SIRS (systemic inflammatory response syndrome) (HCC) 02/11/2021   Abscess of bursa of left ankle 01/21/2021   Dehiscence of amputation stump (HCC)    Ischemia of left BKA site (HCC)    Abscess of leg without foot, left    Osteomyelitis of second toe of right foot (HCC) 07/12/2019   Below knee amputation status, left 01/25/2018   Wound dehiscence, surgical    Status post left foot surgery 12/15/2017   GERD (gastroesophageal reflux disease) 09/26/2017   Hyperlipidemia associated with type 2 diabetes mellitus (HCC) 07/08/2017   Snoring 06/07/2016   Chest tube in place 01/22/2016   Abnormal nuclear stress test 01/22/2016   Pain, chronic due to trauma 07/04/2012   Complex regional pain syndrome I of lower limb 07/04/2012   COPD (chronic obstructive pulmonary disease) (HCC) 01/27/2011    Pre-operative cardiovascular examination 01/27/2011   Nonspecific abnormal electrocardiogram (ECG) (EKG) 01/27/2011   Murmur 01/27/2011   DM2 (diabetes mellitus, type 2) (HCC)    HTN (hypertension)    HLD (hyperlipidemia)    Tobacco abuse    Past Medical History:  Diagnosis Date   Acute renal failure (HCC)    in setting of NSAID use and orthopedic surgery 2010   Anxiety and depression    Chronic diastolic CHF (congestive heart failure) (HCC)    a. Echo 6/17: severe conc LVH, vigorous EF, EF 65-70%, no dynamic obstruction, no RWMA, Gr 1 DD, mild TR  //  b. LHC 8/17: no  sig CAD, LVEDP 28   Cirrhosis of liver (HCC)    COPD (chronic obstructive pulmonary disease) (HCC)    Diabetic ulcer of left foot (HCC)    DM2 (diabetes mellitus, type 2) (HCC)    Dysrhythmia    Family history of early CAD    GERD (gastroesophageal reflux disease)    History of amputation of foot (HCC)    L trans-met // R toe   History of cardiac catheterization    a. LHC 2002: irregs  //  b. LHC in 8/17: no sig CAD, apical DK, hyperdynamic LV, LVEDP 28   History of kidney stones    History of nuclear stress test    a. Nuc 7/17: Overall, intermediate risk nuclear stress test secondary to small size of apical lateral defect and reduced ejection fraction.  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    Kidney calculi    Palpitations    Pneumonia    PTSD (post-traumatic stress disorder)    Tobacco abuse    Transient ischemic attack     Family History  Problem Relation Age of Onset   Leukemia Mother 29       died   Lung cancer Father 14       died   Heart attack Brother 68   Heart attack Brother 84   Hypertension Brother        X3   Hypertension Sister        X2   Diabetes Sister    Stroke Sister    Diabetes Sister    Other Brother        Set designer accident    Past Surgical History:  Procedure  Laterality Date   ABDOMINAL AORTOGRAM W/LOWER EXTREMITY N/A 01/31/2023   Procedure: ABDOMINAL AORTOGRAM W/LOWER EXTREMITY;  Surgeon: Sheree Penne Bruckner, MD;  Location: [facility] INVASIVE CV LAB;  Service: Cardiovascular;  Laterality: N/A;   AMPUTATION Left 01/03/2018   Procedure: LEFT MIDFOOT AMPUTATION/REVISION MIDAMPUTAION;  Surgeon: Vernetta Bruckner GRADE, MD;  Location: [facility] OR;  Service: Orthopedics;  Laterality: Left;   AMPUTATION Left 01/25/2018   Procedure: LEFT BELOW KNEE AMPUTATION;  Surgeon: Harden Jerona GAILS, MD;  Location: [facility] OR;  Service: Orthopedics;  Laterality: Left;   AMPUTATION Left 01/21/2021   Procedure: LEFT ABOVE KNEE AMPUTATION;  Surgeon: Harden Jerona GAILS, MD;  Location: [facility] OR;  Service: Orthopedics;  Laterality: Left;   AMPUTATION Right 02/09/2023   Procedure: RIGHT TRANSMETATARSAL AMPUTATION;  Surgeon: Harden Jerona GAILS, MD;  Location: [facility] OR;  Service: Orthopedics;  Laterality: Right;   AMPUTATION Right 03/11/2023   Procedure: RIGHT BELOW KNEE AMPUTATION;  Surgeon: Harden Jerona GAILS, MD;  Location: [facility] OR;  Service: Orthopedics;  Laterality: Right;   AMPUTATION TOE Right 07/17/2019   Procedure: AMPUTATION RIGHT FOOT 2ND TOE;  Surgeon: Vernetta Bruckner GRADE, MD;  Location: [facility];  Service: Orthopedics;  Laterality: Right;   APPLICATION OF WOUND VAC Left 01/21/2021   Procedure: APPLICATION OF WOUND VAC;  Surgeon: Harden Jerona GAILS, MD;  Location: [facility] OR;  Service: Orthopedics;  Laterality: Left;   BELOW KNEE LEG AMPUTATION Left 01/25/2018   CARDIAC CATHETERIZATION N/A 01/22/2016   Procedure: Left Heart Cath and Coronary Angiography;  Surgeon: Peter M Swaziland, MD;  Location: [facility] INVASIVE CV LAB;  Service: Cardiovascular;  Laterality: N/A;   CARDIOVERSION N/A  03/17/2023   Procedure: CARDIOVERSION;  Surgeon: Mona Vinie BROCKS, MD;  Location: [facility] INVASIVE CV LAB;  Service: Cardiovascular;  Laterality: N/A;   FOOT AMPUTATION Bilateral    I & D EXTREMITY Left 12/15/2017   Procedure:  IRRIGATION AND DEBRIDEMENT LEFT FOOT ULCER;  Surgeon: Vernetta Lonni GRADE, MD;  Location: [facility] ORS;  Service: Orthopedics;  Laterality: Left;   I & D EXTREMITY Left 07/25/2020   Procedure: LEFT BELOW KNEE AMPUTATION ABSCESS EXCISION AND SKIN GRAFT;  Surgeon: Harden Jerona GAILS, MD;  Location: [facility] OR;  Service: Orthopedics;  Laterality: Left;   I & D EXTREMITY Left 08/22/2020   Procedure: DEBRIDEMENT LEFT BELOW KNEE AMPUTATION AND APPLY KERECIS SKIN GRAFT;  Surgeon: Harden Jerona GAILS, MD;  Location: [facility] OR;  Service: Orthopedics;  Laterality: Left;   LITHOTRIPSY     PERIPHERAL VASCULAR INTERVENTION  01/31/2023   Procedure: PERIPHERAL VASCULAR INTERVENTION;  Surgeon: Sheree Penne Lonni, MD;  Location: [facility] INVASIVE CV LAB;  Service: Cardiovascular;;   TEE WITHOUT CARDIOVERSION N/A 03/17/2023   Procedure: TRANSESOPHAGEAL ECHOCARDIOGRAM;  Surgeon: Mona Vinie BROCKS, MD;  Location: [facility] INVASIVE CV LAB;  Service: Cardiovascular;  Laterality: N/A;   TENDON LENGTHENING Bilateral    calf   TONSILLECTOMY     Social History   Occupational History   Occupation: DISABLED  Tobacco Use   Smoking status: Former    Current packs/day: 1.00    Average packs/day: 1 pack/day for 40.0 years (40.0 ttl pk-yrs)    Types: Cigarettes   Smokeless tobacco: Never  Vaping Use   Vaping status: Never Used  Substance and Sexual Activity   Alcohol use: No   Drug use: No   Sexual activity: Not on file

## 2024-01-20 ENCOUNTER — Telehealth: Payer: Self-pay | Admitting: Orthopedic Surgery

## 2024-01-20 NOTE — Telephone Encounter (Signed)
 [name] says the R leg wound is bigger. Would like to know if he could get worked in on Tuesday? Her cb# [phone]

## 2024-01-20 NOTE — Telephone Encounter (Signed)
 Called and sw pt's wife and he will come in to see Dr. Harden on Tuesday at 10 am.

## 2024-01-24 ENCOUNTER — Ambulatory Visit (INDEPENDENT_AMBULATORY_CARE_PROVIDER_SITE_OTHER): Admitting: Orthopedic Surgery

## 2024-01-24 ENCOUNTER — Encounter: Payer: Self-pay | Admitting: Orthopedic Surgery

## 2024-01-24 ENCOUNTER — Telehealth: Payer: Self-pay | Admitting: Family

## 2024-01-24 ENCOUNTER — Ambulatory Visit: Admitting: Orthopedic Surgery

## 2024-01-24 DIAGNOSIS — Z89511 Acquired absence of right leg below knee: Secondary | ICD-10-CM

## 2024-01-24 DIAGNOSIS — T8781 Dehiscence of amputation stump: Secondary | ICD-10-CM

## 2024-01-24 NOTE — Telephone Encounter (Signed)
 SW wife. She asked to let Dr. Harden know that his wound on BKA is getting worse. More deeper than the last appointment follow up. Packing open with gauze. Per wife, pt will not be up front with the wound. She says she is pt's POA and if he needs a revision surgery that will be okay due to this going on for 11 months now and not getting better. She asked if Dr. Duda can give her a call after his appointment to give her details on what is the plan. She cannot be at his appointments.

## 2024-01-24 NOTE — Telephone Encounter (Signed)
 [name] called. Would like to speak with Rocky before the appointment. She will not be here with him and needs to talk to [place].

## 2024-01-24 NOTE — Progress Notes (Signed)
 Office Visit Note   Patient: Troy [name]           Date of Birth: 05-07-1961           MRN: [id number] Visit Date: 01/24/2024              Requested by: Baird Comer GAILS, NP [address] [place],  [place] [address] PCP: Baird Comer GAILS, NP  Chief Complaint  Patient presents with   Right Leg - Wound Check    Hx Right BKA      HPI: Patient is a 63 year old gentleman who is status post right below-knee amputation.  Patient has undergone prolonged wound care with persistent ulceration.  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  Assessment & Plan: Visit Diagnoses:  1. Right below-knee amputee (HCC)   2. Dehiscence of amputation stump (HCC)     Plan: With the wound deeper and probing to bone will plan for revision of the transtibial amputation on Friday.  Anticipate discharge back to skilled nursing.  Risks and benefits were discussed including need for additional surgery.  Discussed that we would revise the entire transtibial amputation.  Follow-Up Instructions: Return in about 2 weeks (around 02/07/2024).   Ortho Exam  Patient is alert, oriented, no adenopathy, well-dressed, normal affect, normal respiratory effort. Examination patient has a persistent wound right transtibial amputation.  The ulcer is lateral.  With probing this probes all the way to bone.  There is no cellulitis no purulent drainage.   CT scan in April showed no evidence of osteomyelitis.   Imaging: No results found. No images are attached to the encounter.  Labs: Lab Results  Component Value Date   HGBA1C 11.7 (H) 02/05/2023   HGBA1C 10.8 (H) 08/18/2021   HGBA1C 12.3 (H) 01/21/2021   ESRSEDRATE 110 (H) 02/13/2023   ESRSEDRATE 56 (H) 02/07/2023   CRP 13.0 (H) 02/07/2023   CRP 22.7 (H) 08/20/2021   CRP 23.2 (H) 08/20/2021   REPTSTATUS 04/07/2023 FINAL 04/02/2023   GRAMSTAIN  03/11/2023    ABUNDANT WBC PRESENT, PREDOMINANTLY PMN RARE GRAM POSITIVE  COCCI IN PAIRS    CULT  04/02/2023    NO GROWTH 5 DAYS Performed at [facility], [address] [place], [place] [address]    LABORGA METHICILLIN RESISTANT STAPHYLOCOCCUS AUREUS 03/11/2023     Lab Results  Component Value Date   ALBUMIN 3.9 06/08/2023   ALBUMIN 3.9 04/27/2023   ALBUMIN 2.2 (L) 03/23/2023   PREALBUMIN 5 (L) 03/11/2023    Lab Results  Component Value Date   MG 1.7 03/18/2023   MG 1.6 (L) 02/13/2023   MG 1.8 02/12/2023   No results found for: [facility]  Lab Results  Component Value Date   PREALBUMIN 5 (L) 03/11/2023      Latest Ref Rng & Units 04/08/2023    7:47 AM 04/07/2023    5:23 AM 04/04/2023    3:07 AM  CBC EXTENDED  WBC 4.0 - 10.5 K/uL 11.2  13.0  16.3   RBC 4.22 - 5.81 MIL/uL 4.58  4.46  4.34   Hemoglobin 13.0 - 17.0 g/dL 89.3  89.3  89.8   HCT 39.0 - 52.0 % 35.2  35.3  34.6   Platelets 150 - 400 K/uL 385  399  404   NEUT# 1.7 - 7.7 K/uL 6.8  8.5    Lymph# 0.7 - 4.0 K/uL 3.0  3.0       There is no height  or weight on file to calculate BMI.  Orders:  No orders of the defined types were placed in this encounter.  No orders of the defined types were placed in this encounter.    Procedures: No procedures performed  Clinical Data: No additional findings.  ROS:  All other systems negative, except as noted in the HPI. Review of Systems  Objective: Vital Signs: There were no vitals taken for this visit.  Specialty Comments:  No specialty comments available.  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   (HFpEF) heart failure with preserved ejection fraction (HCC) 04/02/2023   Lactic acidosis 04/02/2023   Acute respiratory failure with hypoxia (HCC) 04/02/2023   Leukocytosis 04/02/2023   Uncontrolled type 2 diabetes mellitus with hyperglycemia, with long-term current use of insulin  (HCC) 04/02/2023   Hypochromic anemia 04/02/2023   PVD  (peripheral vascular disease) (HCC) 04/02/2023   Thrombocytosis 04/02/2023   Respiratory distress 03/21/2023   Osteomyelitis of ankle or foot, acute, right (HCC) 03/11/2023   Abscess of right lower leg 03/11/2023   Below-knee amputation of right lower extremity (HCC) 03/11/2023   Chronic respiratory failure with hypoxia (HCC) 02/12/2023   Pulmonary infiltrates 02/12/2023   Acute on chronic heart failure with preserved ejection fraction (HFpEF) (HCC) 02/12/2023   Chronic osteomyelitis of right foot with draining sinus (HCC) 02/07/2023   Paroxysmal atrial flutter (HCC) 02/05/2023   Hypotension 02/05/2023   Atrial fibrillation with RVR (HCC) 02/04/2023   Atrial flutter (HCC) 02/04/2023   Lower limb ischemia: ??? 08/22/2021   Diabetic acidosis without coma (HCC)    Hypokalemia    Hypomagnesemia    Hyponatremia    Multifocal pneumonia    Parapneumonic effusion 08/18/2021   Bowel Ileus (HCC) 08/18/2021   ?? 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   Ketoacidosis due to type 2 diabetes mellitus (HCC) 08/15/2021   COVID-19 virus infection 08/15/2021   S/P AKA (above knee amputation), left (HCC) 02/12/2021   Amputation of right great toe and 2nd Toe 02/12/2021   Sepsis due to left-sided neck cellulitis/MRSA 02/12/2021   Hypotensive episode    Elevated troponin    SIRS (systemic inflammatory response syndrome) (HCC) 02/11/2021   Abscess of bursa of left ankle 01/21/2021   Dehiscence of amputation stump (HCC)    Ischemia of left BKA site (HCC)    Abscess of leg without foot, left    Osteomyelitis of second toe of right foot (HCC) 07/12/2019   Below knee amputation status, left 01/25/2018   Wound dehiscence, surgical    Status post left foot surgery 12/15/2017   GERD (gastroesophageal reflux disease) 09/26/2017   Hyperlipidemia associated with type 2 diabetes mellitus (HCC) 07/08/2017   Snoring 06/07/2016   Chest tube in place 01/22/2016   Abnormal nuclear stress test 01/22/2016   Pain,  chronic due to trauma 07/04/2012   Complex regional pain syndrome I of lower limb 07/04/2012   COPD (chronic obstructive pulmonary disease) (HCC) 01/27/2011   Pre-operative cardiovascular examination 01/27/2011   Nonspecific abnormal electrocardiogram (ECG) (EKG) 01/27/2011   Murmur 01/27/2011   DM2 (diabetes mellitus, type 2) (HCC)    HTN (hypertension)    HLD (hyperlipidemia)    Tobacco abuse    Past Medical History:  Diagnosis Date   Acute renal failure (HCC)    in setting of NSAID use and orthopedic surgery 2010   Anxiety and depression    Chronic diastolic CHF (congestive heart failure) (  HCC)    a. Echo 6/17: severe conc LVH, vigorous EF, EF 65-70%, no dynamic obstruction, no RWMA, Gr 1 DD, mild TR  //  b. 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 LHC 2002: irregs  //  b. LHC in 8/17: no sig CAD, apical DK, hyperdynamic LV, LVEDP 28   History of kidney stones    History of nuclear stress test    a. Nuc 7/17: Overall, intermediate risk nuclear stress test secondary to small size of apical lateral defect and reduced ejection fraction.  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    Kidney calculi    Palpitations    Pneumonia    PTSD (post-traumatic stress disorder)    Tobacco abuse    Transient ischemic attack     Family History  Problem Relation Age of Onset   Leukemia Mother 50       died   Lung cancer Father 59       died   Heart attack Brother 50   Heart attack Brother 55   Hypertension Brother        X3    Hypertension Sister        X2   Diabetes Sister    Stroke Sister    Diabetes Sister    Other Brother        Set designer accident    Past Surgical History:  Procedure Laterality Date   ABDOMINAL AORTOGRAM W/LOWER EXTREMITY N/A 01/31/2023   Procedure: ABDOMINAL AORTOGRAM W/LOWER EXTREMITY;  Surgeon: Sheree Penne Bruckner, MD;  Location: [facility] INVASIVE CV LAB;  Service: Cardiovascular;  Laterality: N/A;   AMPUTATION Left 01/03/2018   Procedure: LEFT MIDFOOT AMPUTATION/REVISION MIDAMPUTAION;  Surgeon: Vernetta Bruckner GRADE, MD;  Location: [facility] OR;  Service: Orthopedics;  Laterality: Left;   AMPUTATION Left 01/25/2018   Procedure: LEFT BELOW KNEE AMPUTATION;  Surgeon: Harden Jerona GAILS, MD;  Location: [facility] OR;  Service: Orthopedics;  Laterality: Left;   AMPUTATION Left 01/21/2021   Procedure: LEFT ABOVE KNEE AMPUTATION;  Surgeon: Harden Jerona GAILS, MD;  Location: [facility] OR;  Service: Orthopedics;  Laterality: Left;   AMPUTATION Right 02/09/2023   Procedure: RIGHT TRANSMETATARSAL AMPUTATION;  Surgeon: Harden Jerona GAILS, MD;  Location: [facility] OR;  Service: Orthopedics;  Laterality: Right;   AMPUTATION Right 03/11/2023   Procedure: RIGHT BELOW KNEE AMPUTATION;  Surgeon: Harden Jerona GAILS, MD;  Location: [facility] OR;  Service: Orthopedics;  Laterality: Right;   AMPUTATION TOE Right 07/17/2019   Procedure: AMPUTATION RIGHT FOOT 2ND TOE;  Surgeon: Vernetta Bruckner GRADE, MD;  Location: [facility];  Service: Orthopedics;  Laterality: Right;   APPLICATION OF WOUND VAC Left 01/21/2021   Procedure: APPLICATION OF WOUND VAC;  Surgeon: Harden Jerona GAILS, MD;  Location: [facility] OR;  Service: Orthopedics;  Laterality: Left;   BELOW KNEE LEG AMPUTATION Left 01/25/2018   CARDIAC CATHETERIZATION N/A 01/22/2016  Procedure: Left Heart Cath and Coronary Angiography;  Surgeon: Peter M Swaziland, MD;  Location: [facility] INVASIVE CV LAB;  Service: Cardiovascular;  Laterality: N/A;   CARDIOVERSION N/A 03/17/2023   Procedure: CARDIOVERSION;  Surgeon: Mona Vinie BROCKS, MD;  Location: [facility] INVASIVE CV LAB;  Service: Cardiovascular;  Laterality: N/A;   FOOT AMPUTATION Bilateral    I & D EXTREMITY Left 12/15/2017   Procedure: IRRIGATION AND DEBRIDEMENT LEFT FOOT ULCER;  Surgeon: Vernetta Lonni GRADE, MD;  Location: [facility] ORS;  Service: Orthopedics;  Laterality: Left;   I & D EXTREMITY Left 07/25/2020   Procedure: LEFT BELOW KNEE AMPUTATION ABSCESS EXCISION AND SKIN GRAFT;  Surgeon: Harden Jerona GAILS, MD;  Location: [facility] OR;  Service: Orthopedics;  Laterality: Left;   I & D EXTREMITY Left 08/22/2020   Procedure: DEBRIDEMENT LEFT BELOW KNEE AMPUTATION AND APPLY KERECIS SKIN GRAFT;  Surgeon: Harden Jerona GAILS, MD;  Location: [facility] OR;  Service: Orthopedics;  Laterality: Left;   LITHOTRIPSY     PERIPHERAL VASCULAR INTERVENTION  01/31/2023   Procedure: PERIPHERAL VASCULAR INTERVENTION;  Surgeon: Sheree Penne Lonni, MD;  Location: [facility] INVASIVE CV LAB;  Service: Cardiovascular;;   TEE WITHOUT CARDIOVERSION N/A 03/17/2023   Procedure: TRANSESOPHAGEAL ECHOCARDIOGRAM;  Surgeon: Mona Vinie BROCKS, MD;  Location: [facility] INVASIVE CV LAB;  Service: Cardiovascular;  Laterality: N/A;   TENDON LENGTHENING Bilateral    calf   TONSILLECTOMY     Social History   Occupational History   Occupation: DISABLED  Tobacco Use   Smoking status: Former    Current packs/day: 1.00    Average packs/day: 1 pack/day for 40.0 years (40.0 ttl pk-yrs)    Types: Cigarettes   Smokeless tobacco: Never  Vaping Use   Vaping status: Never Used  Substance and Sexual Activity   Alcohol use: No   Drug use: No   Sexual activity: Not on file

## 2024-01-26 ENCOUNTER — Other Ambulatory Visit: Payer: Self-pay

## 2024-01-26 ENCOUNTER — Encounter (HOSPITAL_COMMUNITY): Payer: Self-pay | Admitting: Orthopedic Surgery

## 2024-01-26 MED ORDER — TRANEXAMIC ACID 1000 MG/10ML IV SOLN
2000.0000 mg | INTRAVENOUS | Status: AC
  Filled 2024-01-26: qty 20

## 2024-01-26 NOTE — Progress Notes (Addendum)
 SDW call  Patient is a resident at [facility] and [place], [phone].  Spoke to patients wife for her arrival time. Savannah, RN was given pre-op instructions over the phone. She verbalized understanding of instructions provided. Pre-op instructions were faxed to [phone]    PCP - Comer Reid, NP Cardiologist - Dr. Ole Holts, LOV 09/09/2023 Pulmonary:    PPM/ICD - denies Device Orders - na Rep Notified - na   Chest x-ray - 04/04/2023 EKG -  07/22/2023 Stress Test -01/15/2016 ECHO -  04/07/2023 Cardiac Cath - 01/22/2016 PFT: 06/07/2016  Sleep Study/sleep apnea/CPAP: denies  Type II diabetic.  A1C 9.9 on 03/03/2023.  Facility does a fasting am blood sugar, today was 338. Received his Tresiba , no Humalog  coverage Fasting Blood sugar range: varies greatly How often check sugars daily  [name] , hold 72 hrs, last dose 01/25/2024 Glipizide , hold DOS Tresiba , use 7 units morning of surgery which is 50% of regular dose Humalog , zero units. If blood sugar > 220 use 1/2 sliding scale dose.    Blood Thinner Instructions:  Eliquis , hold per Dr. Harden, last dose 01/25/2024 Aspirin  Instructions: denies   ERAS Protcol - Clears until 1245   Anesthesia review: Yes. TIA, COPD, CHF, HTN, DM

## 2024-01-26 NOTE — Progress Notes (Signed)
 SDW instructions for [facility] and Rehab.  Pre-op Surgical Instructions   Your procedure is scheduled on Friday January 27, 2024. Report to [facility] Main Entrance A at 1:15 PM., then check in with the Admitting office. Any questions or running late day of surgery: call [phone]  Questions prior to your surgery date: call [phone], Monday-Friday, 8am-4pm. If you experience any cold or flu symptoms such as cough, fever, chills, shortness of breath, etc. between now and your scheduled surgery, please notify us  at the above number.     Remember:  Do not eat after midnight the night before your surgery   You may drink clear liquids until 12:45 PM of your surgery.   Clear liquids allowed are: Water , Non-Citrus Juices (without pulp), Carbonated Beverages, Clear Tea (no milk, honey, etc.), Black Coffee Only (NO MILK, CREAM OR POWDERED CREAMER of any kind), and Gatorade.  Take these medicines the morning of surgery with A SIP OF WATER   acetaminophen  (TYLENOL )  divalproex (DEPAKOTE)  pantoprazole  (PROTONIX )  rosuvastatin  (CRESTOR )   May take these medicines IF NEEDED: oxyCODONE  (ROXICODONE )   PER YOUR SURGEON'S INSTRUCTIONS, PLEASE HOLD YOUR apixaban  (ELIQUIS ), WITH THE LAST DOSE BEING 01/25/2024.   One week prior to surgery, STOP taking any Aspirin  (unless otherwise instructed by your surgeon) Aleve, Naproxen, Ibuprofen, Motrin, Advil, Goody's, BC's, all herbal medications, fish oil, and non-prescription vitamins.   WHAT DO I DO ABOUT MY DIABETES MEDICATION?   Do not take oral diabetes medicines (pills) the morning of surgery.        DO NOT TAKE YOUR empagliflozin  ([name] ) 72 HOURS PRIOR TO SURGERY WITH THE LAST DOSE BEING 01/23/2024.         DO NOT TAKE YOUR glipiZIDE  THE MORNING OF SURGERY.   THE MORNING OF SURGERY USE 7 UNITS OF insulin  degludec (TRESIBA ) , WHICH IS 50% OF YOUR REGULAR DOSE.         THE MORNING OF SURGERY USE ZERO UNITS OF insulin  lispro (HUMALOG ) .  If your CBG is greater than 220 mg/dL, you may take  of your sliding scale (correction) dose of insulin .  The day of surgery, do not take other diabetes injectables, including Byetta (exenatide), Bydureon (exenatide ER), Victoza (liraglutide), or Trulicity  (dulaglutide ).   HOW TO MANAGE YOUR DIABETES BEFORE AND AFTER SURGERY  Why is it important to control my blood sugar before and after surgery? Improving blood sugar levels before and after surgery helps healing and can limit problems. A way of improving blood sugar control is eating a healthy diet by:  Eating less sugar and carbohydrates  Increasing activity/exercise  Talking with your doctor about reaching your blood sugar goals High blood sugars (greater than 180 mg/dL) can raise your risk of infections and slow your recovery, so you will need to focus on controlling your diabetes during the weeks before surgery. Make sure that the doctor who takes care of your diabetes knows about your planned surgery including the date and location.  How do I manage my blood sugar before surgery? Check your blood sugar at least 4 times a day, starting 2 days before surgery, to make sure that the level is not too high or low.  Check your blood sugar the morning of your surgery when you wake up and every 2 hours until you get to the Short Stay unit.  If your blood sugar is less than 70 mg/dL, you will need to treat for low blood sugar: Do not take insulin . Treat a low blood sugar (  less than 70 mg/dL) with  cup of clear juice (cranberry or apple), 4 glucose tablets, OR glucose gel. Recheck blood sugar in 15 minutes after treatment (to make sure it is greater than 70 mg/dL). If your blood sugar is not greater than 70 mg/dL on recheck, call [phone] for further instructions. Report your blood sugar to the short stay nurse when you get to Short Stay.  If you are admitted to the hospital after surgery: Your blood sugar will be checked by the staff  and you will probably be given insulin  after surgery (instead of oral diabetes medicines) to make sure you have good blood sugar levels. The goal for blood sugar control after surgery is 80-180 mg/dL.                      Do NOT Smoke (Tobacco/Vaping) for 24 hours prior to your procedure.  If you use a CPAP at night, you may bring your mask/headgear for your overnight stay.   You will be asked to remove any contacts, glasses, piercing's, hearing aid's, dentures/partials prior to surgery. Please bring cases for these items if needed.    Patients discharged the day of surgery will not be allowed to drive home, and someone needs to stay with them for 24 hours.  SURGICAL WAITING ROOM VISITATION Patients may have no more than 2 support people in the waiting area - these visitors may rotate.   Pre-op nurse will coordinate an appropriate time for 1 ADULT support person, who may not rotate, to accompany patient in pre-op.  Children under the age of 1 must have an adult with them who is not the patient and must remain in the main waiting area with an adult.  If the patient needs to stay at the hospital during part of their recovery, the visitor guidelines for inpatient rooms apply.  Please refer to the [facility] website for the visitor guidelines for any additional information.   If you received a COVID test during your pre-op visit  it is requested that you wear a mask when out in public, stay away from anyone that may not be feeling well and notify your surgeon if you develop symptoms. If you have been in contact with anyone that has tested positive in the last 10 days please notify you surgeon.      Pre-operative Bathing Instructions   You can play a key role in reducing the risk of infection after surgery. Your skin needs to be as free of germs as possible. You can reduce the number of germs on your skin by washing with Dial  soap before surgery.             TAKE A SHOWER THE NIGHT  BEFORE SURGERY AND THE DAY OF SURGERY    Please keep in mind the following:  You may shave your face before/day of surgery.  Place clean sheets on your bed the night before surgery Use a clean washcloth (not used since being washed) for each shower.  Shower Instructions:  Oncologist and private area with normal soap. If you choose to wash your hair, wash first with your normal shampoo.  After you use shampoo/soap, rinse your hair and body thoroughly to remove shampoo/soap residue.        3.   Pat dry with a clean towel  4.   Put on clean pajamas    Additional instructions for the day of surgery: DO NOT APPLY any lotions, deodorants or  cologne   Do not wear jewelry Do not bring valuables to the hospital. [facility] is not responsible for valuables/personal belongings. Put on clean/comfortable clothes.  Please brush your teeth.  Ask your nurse before applying any prescription medications to the skin.

## 2024-01-26 NOTE — Anesthesia Preprocedure Evaluation (Signed)
 Anesthesia Evaluation    Airway        Dental   Pulmonary Patient abstained from smoking., former smoker          Cardiovascular hypertension,      Neuro/Psych    GI/Hepatic   Endo/Other  diabetes    Renal/GU      Musculoskeletal   Abdominal   Peds  Hematology   Anesthesia Other Findings   Reproductive/Obstetrics                              Anesthesia Physical Anesthesia Plan  ASA:   Anesthesia Plan:    Post-op Pain Management:    Induction:   PONV Risk Score and Plan:   Airway Management Planned:   Additional Equipment:   Intra-op Plan:   Post-operative Plan:   Informed Consent:   Plan Discussed with:   Anesthesia Plan Comments: (PAT note by Lynwood Hope, PA-C: 63 year old male follows with cardiology history of a flutter, HFpEF, LV apical aneurysm.  He was hospitalized in 02/2019 for for right BKA and experienced a flutter during admission and underwent cardioversion.  He is maintained on amiodarone  200 mg daily and Eliquis  5 mg daily for this.  Last seen by Dr. Vernice on 09/09/2023 and noted to be maintaining sinus rhythm.  Coronary CTA was ordered at that time to exclude obstructive CAD.  CTA with FFR 09/26/23 showed evidence of significant functional stenosis in the ramus intermedius.  Dr. Barbaraann commented on results stating, Obstructive disease in the ramus and small OM branch.  Main epicardial arteries are negative by CT FFR.  Given his lack of chest pain I think we can manage this medically.  Last seen by Dr. Harden 01/24/2024 and noted to have persistent ulceration at site of right BKA.  Recommended to undergo revision transtibial amputation.  He also has a history of left AKA.  Last dose of Eliquis  11/25/2023.  Other pertinent history includes former smoker with associated COPD, TIA IDDM 2 (A1c 9.9 on 03/03/2023), GERD on PPI.  Patient will need day of surgery labs and  evaluation.  EKG 07/22/2023: NSR.  Rate 86.  Coronary CTA with FFR analysis 09/26/2023: 1. Left Main: No significant functional stenosis, CT-FFR 0.99 distal vessel.   2. LAD: Borderline evidence of functional stenosis, CT-FFR 0.86 at mid LAD lesion but with delta CT-FFR 0.06. 3. LCX: No significant functional stenosis, CT-FFR 0.88 distal vessel. OM vessel is not modeled. 4. RCA: No significant functional stenosis, CT-FFR 0.83 at mid RCA lesion with Delta CT-FFR 0.03. 5. RI: Evidence of significant functional stenosis, CT-FFR 0.79 with Delta CT-FFR 0.19.   IMPRESSION: 1. CT FFR analysis shows evidence of functional stenosis in the ramus intermedius. OM vessel was not modeled.  TTE 04/07/2023: 1. 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 This has been noted on  multiple previous CT scans.. Left ventricular ejection fraction, by  estimation, is 70 to 75%. The left ventricle has hyperdynamic function.  The left ventricle demonstrates regional wall  motion abnormalities (see scoring diagram/findings for description). There  is moderate concentric left ventricular hypertrophy. Left ventricular  diastolic parameters are consistent with Grade II diastolic dysfunction  (pseudonormalization).   2. Right ventricular systolic function is low normal. The right  ventricular size is mildly enlarged. Mildly increased right ventricular  wall thickness. There is severely elevated pulmonary artery systolic  pressure. The estimated right ventricular  systolic pressure is 63.2 mmHg.   3.  Left atrial size was moderately dilated.   4. The mitral valve is degenerative. Mild mitral valve regurgitation. No  evidence of mitral stenosis.   5. Tricuspid valve regurgitation is moderate.   6. The aortic valve is tricuspid. There is thickening of the left  coronary cusp of the aortic valve. Aortic valve regurgitation is trivial.  Aortic valve sclerosis/calcification is present, without any evidence of  aortic  stenosis.   7. There is dilatation of the ascending aorta, measuring 38 mm.   8. The inferior vena cava is normal in size with greater than 50%  respiratory variability, suggesting right atrial pressure of 3 mmHg.    )         Anesthesia Quick Evaluation

## 2024-01-26 NOTE — Progress Notes (Signed)
 Anesthesia Chart Review: Same day workup  63 year old male follows with cardiology history of a flutter, HFpEF, LV apical aneurysm.  He was hospitalized in 02/2019 for for right BKA and experienced a flutter during admission and underwent cardioversion.  He is maintained on amiodarone  200 mg daily and Eliquis  5 mg daily for this.  Last seen by Dr. Vernice on 09/09/2023 and noted to be maintaining sinus rhythm.  Coronary CTA was ordered at that time to exclude obstructive CAD.  CTA with FFR 09/26/23 showed evidence of significant functional stenosis in the ramus intermedius.  Dr. Barbaraann commented on results stating, Obstructive disease in the ramus and small OM branch.  Main epicardial arteries are negative by CT FFR.  Given his lack of chest pain I think we can manage this medically.  Last seen by Dr. Harden 01/24/2024 and noted to have persistent ulceration at site of right BKA.  Recommended to undergo revision transtibial amputation.  He also has a history of left AKA.  Last dose of Eliquis  11/25/2023.  Other pertinent history includes former smoker with associated COPD, TIA IDDM 2 (A1c 9.9 on 03/03/2023), GERD on PPI.  Patient will need day of surgery labs and evaluation.  EKG 07/22/2023: NSR.  Rate 86.  Coronary CTA with FFR analysis 09/26/2023: 1. Left Main: No significant functional stenosis, CT-FFR 0.99 distal vessel.   2. LAD: Borderline evidence of functional stenosis, CT-FFR 0.86 at mid LAD lesion but with delta CT-FFR 0.06. 3. LCX: No significant functional stenosis, CT-FFR 0.88 distal vessel. OM vessel is not modeled. 4. RCA: No significant functional stenosis, CT-FFR 0.83 at mid RCA lesion with Delta CT-FFR 0.03. 5. RI: Evidence of significant functional stenosis, CT-FFR 0.79 with Delta CT-FFR 0.19.   IMPRESSION: 1. CT FFR analysis shows evidence of functional stenosis in the ramus intermedius. OM vessel was not modeled.  TTE 04/07/2023: 1. 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  This has been noted on  multiple previous CT scans.. Left ventricular ejection fraction, by  estimation, is 70 to 75%. The left ventricle has hyperdynamic function.  The left ventricle demonstrates regional wall  motion abnormalities (see scoring diagram/findings for description). There  is moderate concentric left ventricular hypertrophy. Left ventricular  diastolic parameters are consistent with Grade II diastolic dysfunction  (pseudonormalization).   2. Right ventricular systolic function is low normal. The right  ventricular size is mildly enlarged. Mildly increased right ventricular  wall thickness. There is severely elevated pulmonary artery systolic  pressure. The estimated right ventricular  systolic pressure is 63.2 mmHg.   3. Left atrial size was moderately dilated.   4. The mitral valve is degenerative. Mild mitral valve regurgitation. No  evidence of mitral stenosis.   5. Tricuspid valve regurgitation is moderate.   6. The aortic valve is tricuspid. There is thickening of the left  coronary cusp of the aortic valve. Aortic valve regurgitation is trivial.  Aortic valve sclerosis/calcification is present, without any evidence of  aortic stenosis.   7. There is dilatation of the ascending aorta, measuring 38 mm.   8. The inferior vena cava is normal in size with greater than 50%  respiratory variability, suggesting right atrial pressure of 3 mmHg.       Lynwood Geofm RIGGERS [facility]/Anesthesiology Phone [phone] 01/26/2024 2:55 PM

## 2024-01-27 ENCOUNTER — Ambulatory Visit: Admitting: Cardiovascular Disease

## 2024-01-27 NOTE — H&P (Signed)
 Troy [name] is an 63 y.o. male.   Chief Complaint: Painful dehiscence right below-knee amputation. HPI: Patient is a 63 year old gentleman who is status post right below-knee amputation.  Patient has undergone prolonged wound care with persistent ulceration.   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    Past Medical History:  Diagnosis Date   Acute renal failure (HCC)    in setting of NSAID use and orthopedic surgery 2010   Anxiety and depression    Chronic diastolic CHF (congestive heart failure) (HCC)    a. Echo 6/17: severe conc LVH, vigorous EF, EF 65-70%, no dynamic obstruction, no RWMA, Gr 1 DD, mild TR  //  b. 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 LHC 2002: irregs  //  b. LHC in 8/17: no sig CAD, apical DK, hyperdynamic LV, LVEDP 28   History of kidney stones    History of nuclear stress test    a. Nuc 7/17: Overall, intermediate risk nuclear stress test secondary to small size of apical lateral defect and reduced ejection fraction.  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    Kidney calculi    Palpitations    Pneumonia    PTSD (post-traumatic stress disorder)    Tobacco abuse    Transient ischemic attack     Past Surgical History:  Procedure Laterality Date   ABDOMINAL AORTOGRAM W/LOWER EXTREMITY N/A 01/31/2023   Procedure: ABDOMINAL AORTOGRAM W/LOWER EXTREMITY;  Surgeon: Sheree Penne Bruckner, MD;  Location: [facility] INVASIVE CV LAB;  Service: Cardiovascular;  Laterality: N/A;   AMPUTATION  Left 01/03/2018   Procedure: LEFT MIDFOOT AMPUTATION/REVISION MIDAMPUTAION;  Surgeon: Vernetta Bruckner GRADE, MD;  Location: [facility] OR;  Service: Orthopedics;  Laterality: Left;   AMPUTATION Left 01/25/2018   Procedure: LEFT BELOW KNEE AMPUTATION;  Surgeon: Harden Jerona GAILS, MD;  Location: [facility] OR;  Service: Orthopedics;  Laterality: Left;   AMPUTATION Left 01/21/2021   Procedure: LEFT ABOVE KNEE AMPUTATION;  Surgeon: Harden Jerona GAILS, MD;  Location: [facility] OR;  Service: Orthopedics;  Laterality: Left;   AMPUTATION Right 02/09/2023   Procedure: RIGHT TRANSMETATARSAL AMPUTATION;  Surgeon: Harden Jerona GAILS, MD;  Location: [facility] OR;  Service: Orthopedics;  Laterality: Right;   AMPUTATION Right 03/11/2023   Procedure: RIGHT BELOW KNEE AMPUTATION;  Surgeon: Harden Jerona GAILS, MD;  Location: [facility] OR;  Service: Orthopedics;  Laterality: Right;   AMPUTATION TOE Right 07/17/2019   Procedure: AMPUTATION RIGHT FOOT 2ND TOE;  Surgeon: Vernetta Bruckner GRADE, MD;  Location: [facility];  Service: Orthopedics;  Laterality: Right;   APPLICATION OF WOUND VAC Left 01/21/2021   Procedure: APPLICATION OF WOUND VAC;  Surgeon: Harden Jerona GAILS, MD;  Location: [facility] OR;  Service: Orthopedics;  Laterality: Left;   BELOW KNEE LEG AMPUTATION Left 01/25/2018   CARDIAC CATHETERIZATION N/A 01/22/2016   Procedure: Left Heart Cath and Coronary  Angiography;  Surgeon: Peter M Swaziland, MD;  Location: [facility] INVASIVE CV LAB;  Service: Cardiovascular;  Laterality: N/A;   CARDIOVERSION N/A 03/17/2023   Procedure: CARDIOVERSION;  Surgeon: Mona Vinie BROCKS, MD;  Location: [facility] INVASIVE CV LAB;  Service: Cardiovascular;  Laterality: N/A;   FOOT AMPUTATION Bilateral    I & D EXTREMITY Left 12/15/2017   Procedure: IRRIGATION AND DEBRIDEMENT LEFT FOOT ULCER;  Surgeon: Vernetta Lonni GRADE, MD;  Location: [facility] ORS;  Service: Orthopedics;  Laterality: Left;   I & D EXTREMITY Left 07/25/2020   Procedure: LEFT BELOW KNEE AMPUTATION ABSCESS EXCISION AND SKIN GRAFT;   Surgeon: Harden Jerona GAILS, MD;  Location: [facility] OR;  Service: Orthopedics;  Laterality: Left;   I & D EXTREMITY Left 08/22/2020   Procedure: DEBRIDEMENT LEFT BELOW KNEE AMPUTATION AND APPLY KERECIS SKIN GRAFT;  Surgeon: Harden Jerona GAILS, MD;  Location: [facility] OR;  Service: Orthopedics;  Laterality: Left;   LITHOTRIPSY     PERIPHERAL VASCULAR INTERVENTION  01/31/2023   Procedure: PERIPHERAL VASCULAR INTERVENTION;  Surgeon: Sheree Penne Lonni, MD;  Location: [facility] INVASIVE CV LAB;  Service: Cardiovascular;;   TEE WITHOUT CARDIOVERSION N/A 03/17/2023   Procedure: TRANSESOPHAGEAL ECHOCARDIOGRAM;  Surgeon: Mona Vinie BROCKS, MD;  Location: [facility] INVASIVE CV LAB;  Service: Cardiovascular;  Laterality: N/A;   TENDON LENGTHENING Bilateral    calf   TONSILLECTOMY      Family History  Problem Relation Age of Onset   Leukemia Mother 2       died   Lung cancer Father 60       died   Heart attack Brother 64   Heart attack Brother 39   Hypertension Brother        X3   Hypertension Sister        X2   Diabetes Sister    Stroke Sister    Diabetes Sister    Other Brother        Set designer accident   Social History:  reports that he has quit smoking. His smoking use included cigarettes. He has a 40 pack-year smoking history. He has never used smokeless tobacco. He reports that he does not drink alcohol and does not use drugs.  Allergies:  Allergies  Allergen Reactions   Hydromorphone  Hcl Er Anaphylaxis and Other (See Comments)    Allergic to DYE in extended-release tablet, can tolerate other forms of hydromorphone    Nucynta [Tapentadol] Anaphylaxis, Swelling and Other (See Comments)    Throat angioedema   Exalamide Other (See Comments)    Unknown reaction    No medications prior to admission.    No results found for this or any previous visit (from the past 48 hours). No results found.  Review of Systems  All other systems reviewed and are negative.   There were no vitals taken for this  visit. Physical Exam  Patient is alert, oriented, no adenopathy, well-dressed, normal affect, normal respiratory effort. Examination patient has a persistent wound right transtibial amputation.  The ulcer is lateral.  With probing this probes all the way to bone.  There is no cellulitis no purulent drainage. Assessment/Plan 1. Right below-knee amputee (HCC)   2. Dehiscence of amputation stump (HCC)       Plan: With the wound deeper and probing to bone will plan for revision of the transtibial amputation on Friday.  Anticipate discharge back to skilled nursing.  Risks and benefits were discussed including need for additional surgery.  Discussed that we would revise the entire transtibial  amputation.  Jerona LULLA Sage, MD 01/27/2024, 7:04 AM

## 2024-01-27 NOTE — Progress Notes (Signed)
 [facility] nurse called and reported that patient ate a full breakfast, bacon eggs sausage and gravy biscuit. Anesthesia made aware. Patient will be an eight hour delay. Short stay nurse informed [facility] re[facility] nurse to keep patient NPO except sips with medications.

## 2024-02-07 ENCOUNTER — Encounter: Payer: Self-pay | Admitting: Orthopedic Surgery

## 2024-02-08 NOTE — H&P (Signed)
 Troy [name] is an 63 y.o. male.   Chief Complaint: Painful dehiscence right below-knee amputation. HPI: Patient is a 63 year old gentleman who is status post right below-knee amputation.  Patient has undergone prolonged wound care with persistent ulceration.   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 Plan for revision of right BKA.     Past Medical History:  Diagnosis Date   Acute renal failure (HCC)    in setting of NSAID use and orthopedic surgery 2010   Anxiety and depression    Chronic diastolic CHF (congestive heart failure) (HCC)    a. Echo 6/17: severe conc LVH, vigorous EF, EF 65-70%, no dynamic obstruction, no RWMA, Gr 1 DD, mild TR  //  b. 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 LHC 2002: irregs  //  b. LHC in 8/17: no sig CAD, apical DK, hyperdynamic LV, LVEDP 28   History of kidney stones    History of nuclear stress test    a. Nuc 7/17: Overall, intermediate risk nuclear stress test secondary to small size of apical lateral defect and reduced ejection fraction.  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    Kidney calculi    Palpitations    Pneumonia    PTSD (post-traumatic stress disorder)    Tobacco abuse    Transient ischemic attack     Past Surgical History:  Procedure Laterality Date   ABDOMINAL AORTOGRAM W/LOWER EXTREMITY N/A 01/31/2023   Procedure: ABDOMINAL AORTOGRAM W/LOWER EXTREMITY;  Surgeon: Sheree Penne Bruckner, MD;  Location: [facility] INVASIVE CV LAB;  Service: Cardiovascular;   Laterality: N/A;   AMPUTATION Left 01/03/2018   Procedure: LEFT MIDFOOT AMPUTATION/REVISION MIDAMPUTAION;  Surgeon: Vernetta Bruckner GRADE, MD;  Location: [facility] OR;  Service: Orthopedics;  Laterality: Left;   AMPUTATION Left 01/25/2018   Procedure: LEFT BELOW KNEE AMPUTATION;  Surgeon: Harden Jerona GAILS, MD;  Location: [facility] OR;  Service: Orthopedics;  Laterality: Left;   AMPUTATION Left 01/21/2021   Procedure: LEFT ABOVE KNEE AMPUTATION;  Surgeon: Harden Jerona GAILS, MD;  Location: [facility] OR;  Service: Orthopedics;  Laterality: Left;   AMPUTATION Right 02/09/2023   Procedure: RIGHT TRANSMETATARSAL AMPUTATION;  Surgeon: Harden Jerona GAILS, MD;  Location: [facility] OR;  Service: Orthopedics;  Laterality: Right;   AMPUTATION Right 03/11/2023   Procedure: RIGHT BELOW KNEE AMPUTATION;  Surgeon: Harden Jerona GAILS, MD;  Location: [facility] OR;  Service: Orthopedics;  Laterality: Right;   AMPUTATION TOE Right 07/17/2019   Procedure: AMPUTATION RIGHT FOOT 2ND TOE;  Surgeon: Vernetta Bruckner GRADE, MD;  Location: [facility];  Service: Orthopedics;  Laterality: Right;   APPLICATION OF WOUND VAC Left 01/21/2021   Procedure: APPLICATION OF WOUND VAC;  Surgeon: Harden Jerona GAILS, MD;  Location: [facility] OR;  Service: Orthopedics;  Laterality: Left;   BELOW KNEE LEG AMPUTATION Left 01/25/2018   CARDIAC CATHETERIZATION N/A 01/22/2016  Procedure: Left Heart Cath and Coronary Angiography;  Surgeon: Peter M Swaziland, MD;  Location: [facility] INVASIVE CV LAB;  Service: Cardiovascular;  Laterality: N/A;   CARDIOVERSION N/A 03/17/2023   Procedure: CARDIOVERSION;  Surgeon: Mona Vinie BROCKS, MD;  Location: [facility] INVASIVE CV LAB;  Service: Cardiovascular;  Laterality: N/A;   FOOT AMPUTATION Bilateral    I & D EXTREMITY Left 12/15/2017   Procedure: IRRIGATION AND DEBRIDEMENT LEFT FOOT ULCER;  Surgeon: Vernetta Lonni GRADE, MD;  Location: [facility] ORS;  Service: Orthopedics;  Laterality: Left;   I & D EXTREMITY Left 07/25/2020   Procedure: LEFT BELOW KNEE AMPUTATION ABSCESS  EXCISION AND SKIN GRAFT;  Surgeon: Harden Jerona GAILS, MD;  Location: [facility] OR;  Service: Orthopedics;  Laterality: Left;   I & D EXTREMITY Left 08/22/2020   Procedure: DEBRIDEMENT LEFT BELOW KNEE AMPUTATION AND APPLY KERECIS SKIN GRAFT;  Surgeon: Harden Jerona GAILS, MD;  Location: [facility] OR;  Service: Orthopedics;  Laterality: Left;   LITHOTRIPSY     PERIPHERAL VASCULAR INTERVENTION  01/31/2023   Procedure: PERIPHERAL VASCULAR INTERVENTION;  Surgeon: Sheree Penne Lonni, MD;  Location: [facility] INVASIVE CV LAB;  Service: Cardiovascular;;   TEE WITHOUT CARDIOVERSION N/A 03/17/2023   Procedure: TRANSESOPHAGEAL ECHOCARDIOGRAM;  Surgeon: Mona Vinie BROCKS, MD;  Location: [facility] INVASIVE CV LAB;  Service: Cardiovascular;  Laterality: N/A;   TENDON LENGTHENING Bilateral    calf   TONSILLECTOMY      Family History  Problem Relation Age of Onset   Leukemia Mother 49       died   Lung cancer Father 75       died   Heart attack Brother 57   Heart attack Brother 55   Hypertension Brother        X3   Hypertension Sister        X2   Diabetes Sister    Stroke Sister    Diabetes Sister    Other Brother        Set designer accident   Social History:  reports that he has quit smoking. His smoking use included cigarettes. He has a 40 pack-year smoking history. He has never used smokeless tobacco. He reports that he does not drink alcohol and does not use drugs.  Allergies:  Allergies  Allergen Reactions   Hydromorphone  Hcl Er Anaphylaxis and Other (See Comments)    Allergic to DYE in extended-release tablet, can tolerate other forms of hydromorphone    Nucynta [Tapentadol] Anaphylaxis, Swelling and Other (See Comments)    Throat angioedema   Exalamide Other (See Comments)    Unknown reaction    No medications prior to admission.    No results found for this or any previous visit (from the past 48 hours). No results found.  Review of Systems  All other systems reviewed and are negative.   There were no vitals  taken for this visit.  Physical Exam   There were no vitals taken for this visit. Physical Exam  Patient is alert, oriented, no adenopathy, well-dressed, normal affect, normal respiratory effort. Examination patient has a persistent wound right transtibial amputation.  The ulcer is lateral.  With probing this probes all the way to bone.  There is no cellulitis no purulent drainage.  Assessment/Plan 1. Right below-knee amputee (HCC)   2. Dehiscence of amputation stump (HCC)       Plan: With the wound deeper and probing to bone will plan for revision of the transtibial amputation on Friday.  Anticipate discharge back to skilled nursing.  Risks and benefits were discussed including need for additional surgery.  Discussed that we would revise the entire transtibial amputation.    Maurilio Deland Collet, PA-C 02/08/2024, 1:14 PM

## 2024-02-09 ENCOUNTER — Ambulatory Visit: Admitting: Orthopedic Surgery

## 2024-02-09 ENCOUNTER — Encounter (HOSPITAL_COMMUNITY): Payer: Self-pay | Admitting: Orthopedic Surgery

## 2024-02-09 NOTE — Progress Notes (Signed)
 SDW INSTRUCTIONS given:   Your procedure is scheduled on February 10, 2024.             Report to [facility] Main Entrance A at 5:30 A.M., and check in at the Admitting office.             Call this number if you have problems the morning of surgery:             [phone]               Remember:             Do not eat or drink after midnight the night before your surgery                              Take these medicines the morning of surgery with A SIP OF WATER   acetaminophen  (TYLENOL )  divalproex  (DEPAKOTE )  oxyCODONE  (ROXICODONE )  pantoprazole  (PROTONIX )  rosuvastatin  (CRESTOR )   Eliquis  Last dose 02-07-24      As of today, STOP taking any Aspirin  (unless otherwise instructed by your surgeon) Aleve, Naproxen, Ibuprofen, Motrin, Advil, Goody's, BC's, all herbal medications, fish oil, and all vitamins.      WHAT DO I DO ABOUT MY DIABETES MEDICATION?     Do not take oral diabetes medicines empagliflozin  ([name] ), glipiZIDE    the morning of surgery. [name]  Last dose 02-05-24   TRESIBA  7 units of insulin  morning of procedure (1/2 of normal dose of 13)                                    Humalog - zero units if blood sugar greater than   220 then take 1/2 sliding scale dose.   The day of surgery, do not take other diabetes injectables, including Byetta (exenatide), Bydureon (exenatide ER), Victoza (liraglutide), or Trulicity  (dulaglutide ).   If your CBG is greater than 220 mg/dL, you may take  of your sliding scale (correction) dose of insulin .     HOW TO MANAGE YOUR DIABETES BEFORE AND AFTER SURGERY   Why is it important to control my blood sugar before and after surgery? Improving blood sugar levels before and after surgery helps healing and can limit problems. A way of improving blood sugar control is eating a healthy diet by:  Eating less sugar and carbohydrates  Increasing activity/exercise  Talking with your doctor about reaching your blood sugar goals High  blood sugars (greater than 180 mg/dL) can raise your risk of infections and slow your recovery, so you will need to focus on controlling your diabetes during the weeks before surgery. Make sure that the doctor who takes care of your diabetes knows about your planned surgery including the date and location.   How do I manage my blood sugar before surgery? Check your blood sugar at least 4 times a day, starting 2 days before surgery, to make sure that the level is not too high or low.   Check your blood sugar the morning of your surgery when you wake up and every 2 hours until you get to the Short Stay unit.   If your blood sugar is less than 70 mg/dL, you will need to treat for low blood sugar: Do not take insulin . Treat a low blood sugar (less than 70 mg/dL) with  cup of clear juice (cranberry or apple), 4 glucose tablets,  OR glucose gel. Recheck blood sugar in 15 minutes after treatment (to make sure it is greater than 70 mg/dL). If your blood sugar is not greater than 70 mg/dL on recheck, call [phone] for further instructions. Report your blood sugar to the short stay nurse when you get to Short Stay.   If you are admitted to the hospital after surgery: Your blood sugar will be checked by the staff and you will probably be given insulin  after surgery (instead of oral diabetes medicines) to make sure you have good blood sugar levels. The goal for blood sugar control after surgery is 80-180 mg/dL.                  Do not wear jewelry, make up, or nail polish            Do not wear lotions, powders, perfumes/colognes, or deodorant.            Do not shave 48 hours prior to surgery.  Men may shave face and neck.            Do not bring valuables to the hospital.            [facility] is not responsible for any belongings or valuables.   Do NOT Smoke (Tobacco/Vaping) 24 hours prior to your procedure If you use a CPAP at night, you may bring all equipment for your overnight stay.    Contacts, glasses, dentures or bridgework may not be worn into surgery.      For patients admitted to the hospital, discharge time will be determined by your treatment team.   Patients discharged the day of surgery will not be allowed to drive home, and someone needs to stay with them for 24 hours.       Special instructions:   [facility]- Preparing For Surgery   Before surgery, you can play an important role. Because skin is not sterile, your skin needs to be as free of germs as possible. You can reduce the number of germs on your skin by washing with CHG (chlorahexidine gluconate) Soap before surgery.  CHG is an antiseptic cleaner which kills germs and bonds with the skin to continue killing germs even after washing.     Oral Hygiene is also important to reduce your risk of infection.  Remember - BRUSH YOUR TEETH THE MORNING OF SURGERY WITH YOUR REGULAR TOOTHPASTE   Please do not use if you have an allergy to CHG or antibacterial soaps. If your skin becomes reddened/irritated stop using the CHG.  Do not shave (including legs and underarms) for at least 48 hours prior to first CHG shower. It is OK to shave your face.   Please follow these instructions carefully.              Shower the NIGHT BEFORE SURGERY and the MORNING OF SURGERY with DIAL  Soap.    Pat yourself dry with a CLEAN TOWEL.   Wear CLEAN PAJAMAS to bed the night before surgery   Place CLEAN SHEETS on your bed the night of your first shower and DO NOT SLEEP WITH PETS.     Day of Surgery: Please shower morning of surgery  Wear Clean/Comfortable clothing the morning of surgery Do not apply any deodorants/lotions.   Remember to brush your teeth WITH YOUR REGULAR TOOTHPASTE.   Questions were answered. Patient verbalized understanding of instructions.

## 2024-02-09 NOTE — Progress Notes (Addendum)
 PCP - Baird Comer GAILS, NP  Cardiologist - Cindie Ole DASEN, MD   PPM/ICD - denies Device Orders - n/a Rep Notified - n/a  Chest x-ray - 04-04-23 EKG - 07-22-23 Stress Test - 01-15-16 ECHO - 04-07-23 Cardiac Cath - 01-22-16 Coronary CTA- 09-26-23  Fasting Blood Sugar - per nurse at facility blood sugar  160 this am.  Blood sugar range 110-160 with 3 greater than 200 per staff Checks Blood Sugar daily Last A1c 9.9 on 03-03-23 [name]  last dose 02-05-24 Glipizide  last dose 02-08-24  no evening dose nurse at facility is aware  Blood Thinner Instructions: apixaban  (ELIQUIS ) Last dose 02-07-24 Aspirin  Instructions: denies  ERAS Protcol - NPO  COVID TEST- n/a  Anesthesia review: Yes, Hx of CHF, HTN, COPD, DM,   Patient verbally denies any shortness of breath, fever, cough and chest pain during phone call   -------------  SDW INSTRUCTIONS given:  Your procedure is scheduled on February 10, 2024.  Report to [facility] Main Entrance A at 5:30 A.M., and check in at the Admitting office.  Call this number if you have problems the morning of surgery:  [phone]   Remember:  Do not eat or drink after midnight the night before your surgery      Take these medicines the morning of surgery with A SIP OF WATER   acetaminophen  (TYLENOL )  divalproex  (DEPAKOTE )  oxyCODONE  (ROXICODONE )  pantoprazole  (PROTONIX )  rosuvastatin  (CRESTOR )    As of today, STOP taking any Aspirin  (unless otherwise instructed by your surgeon) Aleve, Naproxen, Ibuprofen, Motrin, Advil, Goody's, BC's, all herbal medications, fish oil, and all vitamins.     WHAT DO I DO ABOUT MY DIABETES MEDICATION?   Do not take oral diabetes medicines empagliflozin  ([name] ),  glipiZIDE  the morning of surgery.  glipiZIDE  Last dose 02-08-24 NO evening dose.       [name]  last dose 02-05-24. TRESIBA  7 units of insulin  morning of procedure (1/2 of normal dose of 13)            Humalog - zero units if blood sugar  greater than   220 then take 1/2 sliding scale dose.  The day of surgery, do not take other diabetes injectables, including Byetta (exenatide), Bydureon (exenatide ER), Victoza (liraglutide), or Trulicity  (dulaglutide ).  If your CBG is greater than 220 mg/dL, you may take  of your sliding scale (correction) dose of insulin .   HOW TO MANAGE YOUR DIABETES BEFORE AND AFTER SURGERY  Why is it important to control my blood sugar before and after surgery? Improving blood sugar levels before and after surgery helps healing and can limit problems. A way of improving blood sugar control is eating a healthy diet by:  Eating less sugar and carbohydrates  Increasing activity/exercise  Talking with your doctor about reaching your blood sugar goals High blood sugars (greater than 180 mg/dL) can raise your risk of infections and slow your recovery, so you will need to focus on controlling your diabetes during the weeks before surgery. Make sure that the doctor who takes care of your diabetes knows about your planned surgery including the date and location.  How do I manage my blood sugar before surgery? Check your blood sugar at least 4 times a day, starting 2 days before surgery, to make sure that the level is not too high or low.  Check your blood sugar the morning of your surgery when you wake up and every 2 hours until you get to the Short Stay unit.  If your blood  sugar is less than 70 mg/dL, you will need to treat for low blood sugar: Do not take insulin . Treat a low blood sugar (less than 70 mg/dL) with  cup of clear juice (cranberry or apple), 4 glucose tablets, OR glucose gel. Recheck blood sugar in 15 minutes after treatment (to make sure it is greater than 70 mg/dL). If your blood sugar is not greater than 70 mg/dL on recheck, call [phone] for further instructions. Report your blood sugar to the short stay nurse when you get to Short Stay.  If you are admitted to the hospital after  surgery: Your blood sugar will be checked by the staff and you will probably be given insulin  after surgery (instead of oral diabetes medicines) to make sure you have good blood sugar levels. The goal for blood sugar control after surgery is 80-180 mg/dL.                  Do not wear jewelry, make up, or nail polish            Do not wear lotions, powders, perfumes/colognes, or deodorant.            Do not shave 48 hours prior to surgery.  Men may shave face and neck.            Do not bring valuables to the hospital.            [facility] is not responsible for any belongings or valuables.  Do NOT Smoke (Tobacco/Vaping) 24 hours prior to your procedure If you use a CPAP at night, you may bring all equipment for your overnight stay.   Contacts, glasses, dentures or bridgework may not be worn into surgery.      For patients admitted to the hospital, discharge time will be determined by your treatment team.   Patients discharged the day of surgery will not be allowed to drive home, and someone needs to stay with them for 24 hours.    Special instructions:   [facility]- Preparing For Surgery  Before surgery, you can play an important role. Because skin is not sterile, your skin needs to be as free of germs as possible. You can reduce the number of germs on your skin by washing with CHG (chlorahexidine gluconate) Soap before surgery.  CHG is an antiseptic cleaner which kills germs and bonds with the skin to continue killing germs even after washing.    Oral Hygiene is also important to reduce your risk of infection.  Remember - BRUSH YOUR TEETH THE MORNING OF SURGERY WITH YOUR REGULAR TOOTHPASTE  Please do not use if you have an allergy to CHG or antibacterial soaps. If your skin becomes reddened/irritated stop using the CHG.  Do not shave (including legs and underarms) for at least 48 hours prior to first CHG shower. It is OK to shave your face.  Please follow these instructions  carefully.   Shower the NIGHT BEFORE SURGERY and the MORNING OF SURGERY with DIAL  Soap.   Pat yourself dry with a CLEAN TOWEL.  Wear CLEAN PAJAMAS to bed the night before surgery  Place CLEAN SHEETS on your bed the night of your first shower and DO NOT SLEEP WITH PETS.   Day of Surgery: Please shower morning of surgery  Wear Clean/Comfortable clothing the morning of surgery Do not apply any deodorants/lotions.   Remember to brush your teeth WITH YOUR REGULAR TOOTHPASTE.   Questions were answered. Patient verbalized understanding of instructions.

## 2024-02-10 ENCOUNTER — Encounter (HOSPITAL_COMMUNITY): Payer: Self-pay | Admitting: Physician Assistant

## 2024-02-10 ENCOUNTER — Inpatient Hospital Stay (HOSPITAL_COMMUNITY)
Admission: RE | Admit: 2024-02-10 | Discharge: 2024-02-14 | DRG: 464 | Disposition: A | Discharge status: Rehabilitation Facility | Attending: Orthopedic Surgery | Admitting: Orthopedic Surgery

## 2024-02-10 ENCOUNTER — Encounter (HOSPITAL_COMMUNITY)
Admission: RE | Disposition: A | Discharge status: Rehabilitation Facility | Payer: Self-pay | Source: Home / Self Care | Attending: Orthopedic Surgery

## 2024-02-10 ENCOUNTER — Other Ambulatory Visit: Payer: Self-pay

## 2024-02-10 ENCOUNTER — Inpatient Hospital Stay (HOSPITAL_COMMUNITY): Admitting: Anesthesiology

## 2024-02-10 ENCOUNTER — Encounter (HOSPITAL_COMMUNITY): Payer: Self-pay | Admitting: Orthopedic Surgery

## 2024-02-10 DIAGNOSIS — L02415 Cutaneous abscess of right lower limb: Principal | ICD-10-CM

## 2024-02-10 DIAGNOSIS — I5032 Chronic diastolic (congestive) heart failure: Secondary | ICD-10-CM | POA: Diagnosis present

## 2024-02-10 DIAGNOSIS — E1151 Type 2 diabetes mellitus with diabetic peripheral angiopathy without gangrene: Secondary | ICD-10-CM | POA: Diagnosis present

## 2024-02-10 DIAGNOSIS — Z801 Family history of malignant neoplasm of trachea, bronchus and lung: Secondary | ICD-10-CM

## 2024-02-10 DIAGNOSIS — T8781 Dehiscence of amputation stump: Secondary | ICD-10-CM | POA: Diagnosis present

## 2024-02-10 DIAGNOSIS — J449 Chronic obstructive pulmonary disease, unspecified: Secondary | ICD-10-CM | POA: Diagnosis present

## 2024-02-10 DIAGNOSIS — K7460 Unspecified cirrhosis of liver: Secondary | ICD-10-CM | POA: Diagnosis present

## 2024-02-10 DIAGNOSIS — E785 Hyperlipidemia, unspecified: Secondary | ICD-10-CM | POA: Diagnosis present

## 2024-02-10 DIAGNOSIS — Z87891 Personal history of nicotine dependence: Secondary | ICD-10-CM

## 2024-02-10 DIAGNOSIS — Z794 Long term (current) use of insulin: Secondary | ICD-10-CM

## 2024-02-10 DIAGNOSIS — T8789 Other complications of amputation stump: Secondary | ICD-10-CM

## 2024-02-10 DIAGNOSIS — F4310 Post-traumatic stress disorder, unspecified: Secondary | ICD-10-CM | POA: Diagnosis present

## 2024-02-10 DIAGNOSIS — Z8673 Personal history of transient ischemic attack (TIA), and cerebral infarction without residual deficits: Secondary | ICD-10-CM | POA: Diagnosis not present

## 2024-02-10 DIAGNOSIS — Z8249 Family history of ischemic heart disease and other diseases of the circulatory system: Secondary | ICD-10-CM

## 2024-02-10 DIAGNOSIS — E0800 Diabetes mellitus due to underlying condition with hyperosmolarity without nonketotic hyperglycemic-hyperosmolar coma (NKHHC): Secondary | ICD-10-CM

## 2024-02-10 DIAGNOSIS — I110 Hypertensive heart disease with heart failure: Secondary | ICD-10-CM | POA: Diagnosis present

## 2024-02-10 DIAGNOSIS — Z806 Family history of leukemia: Secondary | ICD-10-CM

## 2024-02-10 DIAGNOSIS — Z89612 Acquired absence of left leg above knee: Secondary | ICD-10-CM

## 2024-02-10 DIAGNOSIS — Y835 Amputation of limb(s) as the cause of abnormal reaction of the patient, or of later complication, without mention of misadventure at the time of the procedure: Secondary | ICD-10-CM | POA: Diagnosis present

## 2024-02-10 DIAGNOSIS — S88111A Complete traumatic amputation at level between knee and ankle, right lower leg, initial encounter: Secondary | ICD-10-CM

## 2024-02-10 DIAGNOSIS — Z833 Family history of diabetes mellitus: Secondary | ICD-10-CM | POA: Diagnosis not present

## 2024-02-10 DIAGNOSIS — I5033 Acute on chronic diastolic (congestive) heart failure: Secondary | ICD-10-CM

## 2024-02-10 DIAGNOSIS — Z823 Family history of stroke: Secondary | ICD-10-CM

## 2024-02-10 DIAGNOSIS — K219 Gastro-esophageal reflux disease without esophagitis: Secondary | ICD-10-CM | POA: Diagnosis present

## 2024-02-10 HISTORY — PX: REVISION AMPUTATION, BELOW THE KNEE: SHX7423

## 2024-02-10 HISTORY — DX: Other complications of amputation stump: T87.89

## 2024-02-10 HISTORY — PX: APPLICATION OF WOUND VAC: SHX5189

## 2024-02-10 LAB — CBC WITH DIFFERENTIAL/PLATELET
Abs Immature Granulocytes: 0.06 K/uL (ref 0.00–0.07)
Basophils Absolute: 0.1 K/uL (ref 0.0–0.1)
Basophils Relative: 1 %
Eosinophils Absolute: 0.4 K/uL (ref 0.0–0.5)
Eosinophils Relative: 3 %
HCT: 38.8 % — ABNORMAL LOW (ref 39.0–52.0)
Hemoglobin: 11.5 g/dL — ABNORMAL LOW (ref 13.0–17.0)
Immature Granulocytes: 1 %
Lymphocytes Relative: 23 %
Lymphs Abs: 3 K/uL (ref 0.7–4.0)
MCH: 22 pg — ABNORMAL LOW (ref 26.0–34.0)
MCHC: 29.6 g/dL — ABNORMAL LOW (ref 30.0–36.0)
MCV: 74.2 fL — ABNORMAL LOW (ref 80.0–100.0)
Monocytes Absolute: 1 K/uL (ref 0.1–1.0)
Monocytes Relative: 8 %
Neutro Abs: 8.6 K/uL — ABNORMAL HIGH (ref 1.7–7.7)
Neutrophils Relative %: 64 %
Platelets: 255 K/uL (ref 150–400)
RBC: 5.23 MIL/uL (ref 4.22–5.81)
RDW: 17.2 % — ABNORMAL HIGH (ref 11.5–15.5)
WBC: 13.1 K/uL — ABNORMAL HIGH (ref 4.0–10.5)
nRBC: 0 % (ref 0.0–0.2)

## 2024-02-10 LAB — COMPREHENSIVE METABOLIC PANEL WITH GFR
ALT: 9 U/L (ref 0–44)
AST: 17 U/L (ref 15–41)
Albumin: 3.4 g/dL — ABNORMAL LOW (ref 3.5–5.0)
Alkaline Phosphatase: 73 U/L (ref 38–126)
Anion gap: 10 (ref 5–15)
BUN: 12 mg/dL (ref 8–23)
CO2: 23 mmol/L (ref 22–32)
Calcium: 9 mg/dL (ref 8.9–10.3)
Chloride: 107 mmol/L (ref 98–111)
Creatinine, Ser: 0.62 mg/dL (ref 0.61–1.24)
GFR, Estimated: >60 mL/min (ref >60)
Glucose, Bld: 209 mg/dL — ABNORMAL HIGH (ref 70–99)
Potassium: 3.6 mmol/L (ref 3.5–5.1)
Sodium: 140 mmol/L (ref 135–145)
Total Bilirubin: 0.2 mg/dL (ref 0.0–1.2)
Total Protein: 7 g/dL (ref 6.5–8.1)

## 2024-02-10 LAB — GLUCOSE, CAPILLARY
Glucose-Capillary: 211 mg/dL — ABNORMAL HIGH (ref 70–99)
Glucose-Capillary: 183 mg/dL — ABNORMAL HIGH (ref 70–99)
Glucose-Capillary: 153 mg/dL — ABNORMAL HIGH (ref 70–99)
Glucose-Capillary: 164 mg/dL — ABNORMAL HIGH (ref 70–99)
Glucose-Capillary: 172 mg/dL — ABNORMAL HIGH (ref 70–99)

## 2024-02-10 LAB — HEMOGLOBIN A1C
Hgb A1c MFr Bld: 7.5 % — ABNORMAL HIGH (ref 4.8–5.6)
Mean Plasma Glucose: 168.55 mg/dL

## 2024-02-10 SURGERY — REVISION AMPUTATION, BELOW THE KNEE
Anesthesia: General | Site: Leg Lower | Laterality: Right

## 2024-02-10 SURGERY — REVISION AMPUTATION, BELOW THE KNEE
Anesthesia: Choice | Site: Leg Lower | Laterality: Right

## 2024-02-10 MED ORDER — OXYCODONE HCL 5 MG/5ML PO SOLN
10.0000 mg | Freq: Four times a day (QID) | ORAL | Status: DC | PRN
  Administered 2024-02-10 (×3): 10 mg via ORAL
  Filled 2024-02-10 (×3): qty 10

## 2024-02-10 MED ORDER — ACETAMINOPHEN 500 MG PO TABS
500.0000 mg | ORAL_TABLET | Freq: Three times a day (TID) | ORAL | Status: DC
  Administered 2024-02-10 – 2024-02-14 (×12): 500 mg via ORAL
  Filled 2024-02-10 (×13): qty 1

## 2024-02-10 MED ORDER — PROPOFOL 10 MG/ML IV BOLUS
INTRAVENOUS | Status: AC
  Filled 2024-02-10: qty 20

## 2024-02-10 MED ORDER — PROPOFOL 10 MG/ML IV BOLUS
INTRAVENOUS | Status: DC | PRN
  Administered 2024-02-10: 150 ug/kg/min via INTRAVENOUS
  Administered 2024-02-10: 200 ug/kg/min via INTRAVENOUS

## 2024-02-10 MED ORDER — HYDRALAZINE HCL 20 MG/ML IJ SOLN: 5.0000 mg | INTRAMUSCULAR | Status: DC | PRN

## 2024-02-10 MED ORDER — SUVOREXANT 5 MG PO TABS: 5.0000 mg | ORAL_TABLET | Freq: Every day | ORAL | Status: DC

## 2024-02-10 MED ORDER — OXYCODONE HCL 5 MG PO TABS
5.0000 mg | ORAL_TABLET | Freq: Once | ORAL | Status: AC | PRN
  Administered 2024-02-10: 5 mg via ORAL

## 2024-02-10 MED ORDER — ONDANSETRON HCL 4 MG/2ML IJ SOLN
INTRAMUSCULAR | Status: DC | PRN
  Administered 2024-02-10: 4 mg via INTRAVENOUS

## 2024-02-10 MED ORDER — FENTANYL CITRATE (PF) 250 MCG/5ML IJ SOLN
INTRAMUSCULAR | Status: AC
  Filled 2024-02-10: qty 5

## 2024-02-10 MED ORDER — LACTATED RINGERS IV SOLN: INTRAVENOUS | Status: DC

## 2024-02-10 MED ORDER — POTASSIUM CHLORIDE CRYS ER 20 MEQ PO TBCR
40.0000 meq | EXTENDED_RELEASE_TABLET | Freq: Every day | ORAL | Status: DC | PRN
  Administered 2024-02-12: 40 meq via ORAL
  Filled 2024-02-10: qty 2

## 2024-02-10 MED ORDER — INSULIN GLARGINE 100 UNIT/ML ~~LOC~~ SOLN
13.0000 [IU] | Freq: Every day | SUBCUTANEOUS | Status: DC
  Administered 2024-02-10 – 2024-02-14 (×5): 13 [IU] via SUBCUTANEOUS
  Filled 2024-02-10 (×5): qty 0.13

## 2024-02-10 MED ORDER — AMISULPRIDE (ANTIEMETIC) 5 MG/2ML IV SOLN: 10.0000 mg | Freq: Once | INTRAVENOUS | Status: DC | PRN

## 2024-02-10 MED ORDER — LIDOCAINE 2% (20 MG/ML) 5 ML SYRINGE
INTRAMUSCULAR | Status: DC | PRN
  Administered 2024-02-10: 40 mg via INTRAVENOUS

## 2024-02-10 MED ORDER — VASHE WOUND IRRIGATION OPTIME
TOPICAL | Status: DC | PRN
  Administered 2024-02-10: 34 [oz_av]

## 2024-02-10 MED ORDER — EMPAGLIFLOZIN 10 MG PO TABS
10.0000 mg | ORAL_TABLET | Freq: Every day | ORAL | Status: DC
  Administered 2024-02-11 – 2024-02-14 (×4): 10 mg via ORAL
  Filled 2024-02-10 (×4): qty 1

## 2024-02-10 MED ORDER — CEFAZOLIN SODIUM-DEXTROSE 2-4 GM/100ML-% IV SOLN
2.0000 g | INTRAVENOUS | Status: AC
  Administered 2024-02-10: 2 g via INTRAVENOUS
  Filled 2024-02-10: qty 100

## 2024-02-10 MED ORDER — MORPHINE SULFATE (PF) 2 MG/ML IV SOLN: 2.0000 mg | INTRAVENOUS | Status: DC | PRN

## 2024-02-10 MED ORDER — DEXMEDETOMIDINE HCL IN NACL 80 MCG/20ML IV SOLN
INTRAVENOUS | Status: DC | PRN
  Administered 2024-02-10: 8 ug via INTRAVENOUS

## 2024-02-10 MED ORDER — OXYCODONE HCL 5 MG PO TABS
ORAL_TABLET | ORAL | Status: AC
  Filled 2024-02-10: qty 1

## 2024-02-10 MED ORDER — INSULIN DEGLUDEC 100 UNIT/ML ~~LOC~~ SOPN: 13.0000 [IU] | PEN_INJECTOR | Freq: Every day | SUBCUTANEOUS | Status: DC

## 2024-02-10 MED ORDER — METOPROLOL TARTRATE 5 MG/5ML IV SOLN: 2.0000 mg | INTRAVENOUS | Status: DC | PRN

## 2024-02-10 MED ORDER — PHENOL 1.4 % MT LIQD
1.0000 | OROMUCOSAL | Status: DC | PRN
Start: 2024-02-10 — End: 2024-02-14

## 2024-02-10 MED ORDER — OXYCODONE HCL 5 MG/5ML PO SOLN: 5.0000 mg | Freq: Once | ORAL | Status: AC | PRN

## 2024-02-10 MED ORDER — FENTANYL CITRATE (PF) 100 MCG/2ML IJ SOLN
INTRAMUSCULAR | Status: AC
Start: 2024-02-10 — End: 2024-02-10
  Filled 2024-02-10: qty 2

## 2024-02-10 MED ORDER — INSULIN ASPART 100 UNIT/ML IJ SOLN: 0.0000 [IU] | INTRAMUSCULAR | Status: DC | PRN

## 2024-02-10 MED ORDER — FUROSEMIDE 40 MG PO TABS
40.0000 mg | ORAL_TABLET | Freq: Every day | ORAL | Status: DC
  Administered 2024-02-10 – 2024-02-14 (×5): 40 mg via ORAL
  Filled 2024-02-10 (×5): qty 1

## 2024-02-10 MED ORDER — FENTANYL CITRATE (PF) 100 MCG/2ML IJ SOLN
25.0000 ug | INTRAMUSCULAR | Status: DC | PRN
  Administered 2024-02-10 (×3): 50 ug via INTRAVENOUS

## 2024-02-10 MED ORDER — DIVALPROEX SODIUM 250 MG PO DR TAB
250.0000 mg | DELAYED_RELEASE_TABLET | Freq: Two times a day (BID) | ORAL | Status: DC
  Administered 2024-02-10 – 2024-02-14 (×9): 250 mg via ORAL
  Filled 2024-02-10 (×9): qty 1

## 2024-02-10 MED ORDER — SODIUM CHLORIDE 0.9 % IV SOLN: 12.5000 mg | INTRAVENOUS | Status: DC | PRN

## 2024-02-10 MED ORDER — DOCUSATE SODIUM 100 MG PO CAPS
100.0000 mg | ORAL_CAPSULE | Freq: Every day | ORAL | Status: DC
  Administered 2024-02-11 – 2024-02-14 (×3): 100 mg via ORAL
  Filled 2024-02-10 (×4): qty 1

## 2024-02-10 MED ORDER — SODIUM CHLORIDE 0.9 % IV SOLN: INTRAVENOUS | Status: AC

## 2024-02-10 MED ORDER — FENTANYL CITRATE (PF) 250 MCG/5ML IJ SOLN
INTRAMUSCULAR | Status: DC | PRN
  Administered 2024-02-10 (×2): 50 ug via INTRAVENOUS

## 2024-02-10 MED ORDER — FENTANYL CITRATE (PF) 100 MCG/2ML IJ SOLN
50.0000 ug | Freq: Once | INTRAMUSCULAR | Status: AC
  Administered 2024-02-10: 50 ug via INTRAVENOUS

## 2024-02-10 MED ORDER — INSULIN ASPART 100 UNIT/ML IJ SOLN
INTRAMUSCULAR | Status: AC
  Administered 2024-02-10: 4 [IU] via SUBCUTANEOUS
  Filled 2024-02-10: qty 1

## 2024-02-10 MED ORDER — DEXMEDETOMIDINE HCL IN NACL 80 MCG/20ML IV SOLN
INTRAVENOUS | Status: AC
  Filled 2024-02-10: qty 20

## 2024-02-10 MED ORDER — ONDANSETRON HCL 4 MG/2ML IJ SOLN
4.0000 mg | Freq: Four times a day (QID) | INTRAMUSCULAR | Status: DC | PRN
  Administered 2024-02-11: 4 mg via INTRAVENOUS
  Filled 2024-02-10: qty 2

## 2024-02-10 MED ORDER — INSULIN ASPART 100 UNIT/ML IJ SOLN
0.0000 [IU] | Freq: Three times a day (TID) | INTRAMUSCULAR | Status: DC
  Administered 2024-02-10 – 2024-02-11 (×5): 3 [IU] via SUBCUTANEOUS
  Administered 2024-02-12 – 2024-02-13 (×4): 2 [IU] via SUBCUTANEOUS
  Administered 2024-02-13 – 2024-02-14 (×2): 3 [IU] via SUBCUTANEOUS

## 2024-02-10 MED ORDER — PANTOPRAZOLE SODIUM 40 MG PO TBEC
40.0000 mg | DELAYED_RELEASE_TABLET | Freq: Every day | ORAL | Status: DC
  Administered 2024-02-10 – 2024-02-14 (×5): 40 mg via ORAL
  Filled 2024-02-10 (×5): qty 1

## 2024-02-10 MED ORDER — CEFAZOLIN SODIUM-DEXTROSE 2-4 GM/100ML-% IV SOLN
2.0000 g | Freq: Three times a day (TID) | INTRAVENOUS | Status: AC
  Administered 2024-02-10 (×2): 2 g via INTRAVENOUS
  Filled 2024-02-10 (×2): qty 100

## 2024-02-10 MED ORDER — GLIPIZIDE 5 MG PO TABS
2.5000 mg | ORAL_TABLET | Freq: Two times a day (BID) | ORAL | Status: DC
  Administered 2024-02-10 – 2024-02-14 (×8): 2.5 mg via ORAL
  Filled 2024-02-10 (×9): qty 0.5

## 2024-02-10 MED ORDER — SODIUM CHLORIDE 0.9 % IV SOLN: INTRAVENOUS | Status: DC | PRN

## 2024-02-10 MED ORDER — MIDAZOLAM HCL 2 MG/2ML IJ SOLN
INTRAMUSCULAR | Status: DC | PRN
  Administered 2024-02-10 (×2): 1 mg via INTRAVENOUS

## 2024-02-10 MED ORDER — LABETALOL HCL 5 MG/ML IV SOLN: 10.0000 mg | INTRAVENOUS | Status: DC | PRN

## 2024-02-10 MED ORDER — FENTANYL CITRATE (PF) 100 MCG/2ML IJ SOLN
INTRAMUSCULAR | Status: AC
  Filled 2024-02-10: qty 2

## 2024-02-10 MED ORDER — CHLORHEXIDINE GLUCONATE 0.12 % MT SOLN
15.0000 mL | Freq: Once | OROMUCOSAL | Status: AC
  Administered 2024-02-10: 15 mL via OROMUCOSAL
  Filled 2024-02-10: qty 15

## 2024-02-10 MED ORDER — MIDAZOLAM HCL 2 MG/2ML IJ SOLN
INTRAMUSCULAR | Status: AC
  Filled 2024-02-10: qty 2

## 2024-02-10 MED ORDER — ORAL CARE MOUTH RINSE: 15.0000 mL | Freq: Once | OROMUCOSAL | Status: AC

## 2024-02-10 MED ORDER — ROSUVASTATIN CALCIUM 20 MG PO TABS
40.0000 mg | ORAL_TABLET | Freq: Every day | ORAL | Status: DC
  Administered 2024-02-10 – 2024-02-14 (×5): 40 mg via ORAL
  Filled 2024-02-10 (×5): qty 2

## 2024-02-10 SURGICAL SUPPLY — 46 items
BAG COUNTER SPONGE SURGICOUNT (BAG) ×1 IMPLANT
BLADE SAW RECIP 87.9 MT (BLADE) ×1 IMPLANT
BLADE SURG 21 STRL SS (BLADE) ×1 IMPLANT
BNDG COHESIVE 6X5 TAN ST LF (GAUZE/BANDAGES/DRESSINGS) IMPLANT
BNDG ELASTIC 4X5.8 VLCR STR LF (GAUZE/BANDAGES/DRESSINGS) IMPLANT
BNDG GAUZE DERMACEA FLUFF 4 (GAUZE/BANDAGES/DRESSINGS) IMPLANT
CANISTER SUCTION 3000ML PPV (SUCTIONS) ×1 IMPLANT
CANISTER WOUND CARE 500ML ATS (WOUND CARE) ×1 IMPLANT
COVER SURGICAL LIGHT HANDLE (MISCELLANEOUS) ×1 IMPLANT
CUFF TRNQT CYL 34X4.125X (TOURNIQUET CUFF) ×1 IMPLANT
DRAPE HALF SHEET 40X57 (DRAPES) IMPLANT
DRAPE INCISE IOBAN 66X45 STRL (DRAPES) ×1 IMPLANT
DRAPE SURG ORHT 6 SPLT 77X108 (DRAPES) IMPLANT
DRAPE U-SHAPE 47X51 STRL (DRAPES) ×1 IMPLANT
DRESSING PREVENA PLUS CUSTOM (GAUZE/BANDAGES/DRESSINGS) ×1 IMPLANT
DRSG VAC GRANUFOAM LG (GAUZE/BANDAGES/DRESSINGS) IMPLANT
DRSG VAC GRANUFOAM MED (GAUZE/BANDAGES/DRESSINGS) IMPLANT
DRSG VAC GRANUFOAM SM (GAUZE/BANDAGES/DRESSINGS) IMPLANT
DRSG VAC PEEL AND PLACE LRG (GAUZE/BANDAGES/DRESSINGS) IMPLANT
DURAPREP 26ML APPLICATOR (WOUND CARE) ×1 IMPLANT
ELECTRODE REM PT RTRN 9FT ADLT (ELECTROSURGICAL) ×1 IMPLANT
GAUZE PAD ABD 8X10 STRL (GAUZE/BANDAGES/DRESSINGS) IMPLANT
GAUZE SPONGE 4X4 12PLY STRL (GAUZE/BANDAGES/DRESSINGS) IMPLANT
GAUZE XEROFORM 5X9 LF (GAUZE/BANDAGES/DRESSINGS) ×1 IMPLANT
GLOVE BIOGEL PI IND STRL 9 (GLOVE) ×1 IMPLANT
GLOVE SURG ORTHO 9.0 STRL STRW (GLOVE) ×1 IMPLANT
GOWN STRL REUS W/ TWL LRG LVL3 (GOWN DISPOSABLE) ×1 IMPLANT
GOWN STRL REUS W/ TWL XL LVL3 (GOWN DISPOSABLE) ×2 IMPLANT
GRAFT SKIN WND MICRO 38 (Tissue) IMPLANT
KIT BASIN OR (CUSTOM PROCEDURE TRAY) ×1 IMPLANT
KIT TURNOVER KIT B (KITS) ×1 IMPLANT
MANIFOLD NEPTUNE II (INSTRUMENTS) ×1 IMPLANT
NS IRRIG 1000ML POUR BTL (IV SOLUTION) ×1 IMPLANT
PACK ORTHO EXTREMITY (CUSTOM PROCEDURE TRAY) ×1 IMPLANT
PAD ARMBOARD POSITIONER FOAM (MISCELLANEOUS) ×1 IMPLANT
PREVENA RESTOR ARTHOFORM 46X30 (CANNISTER) ×1 IMPLANT
SPONGE T-LAP 18X18 ~~LOC~~+RFID (SPONGE) IMPLANT
STAPLER SKIN PROX 35W (STAPLE) IMPLANT
STOCKINETTE IMPERVIOUS LG (DRAPES) ×1 IMPLANT
SUT ETHILON 2 0 PSLX (SUTURE) IMPLANT
SUT SILK 2-0 18XBRD TIE 12 (SUTURE) ×1 IMPLANT
SUT VIC AB 1 CTX 27 (SUTURE) ×2 IMPLANT
TOWEL GREEN STERILE (TOWEL DISPOSABLE) ×1 IMPLANT
TOWEL GREEN STERILE FF (TOWEL DISPOSABLE) ×1 IMPLANT
TUBE CONNECTING 12X1/4 (SUCTIONS) ×1 IMPLANT
YANKAUER SUCT BULB TIP NO VENT (SUCTIONS) ×1 IMPLANT

## 2024-02-10 NOTE — Plan of Care (Signed)
  Problem: Education: Goal: Knowledge of General Education information will improve Description: Including pain rating scale, medication(s)/side effects and non-pharmacologic comfort measures Outcome: Progressing   Problem: Health Behavior/Discharge Planning: Goal: Ability to manage health-related needs will improve Outcome: Progressing   Problem: Clinical Measurements: Goal: Ability to maintain clinical measurements within normal limits will improve Outcome: Progressing Goal: Will remain free from infection Outcome: Progressing Goal: Diagnostic test results will improve Outcome: Progressing Goal: Respiratory complications will improve Outcome: Progressing Goal: Cardiovascular complication will be avoided Outcome: Progressing   Problem: Activity: Goal: Risk for activity intolerance will decrease Outcome: Progressing   Problem: Nutrition: Goal: Adequate nutrition will be maintained Outcome: Progressing   Problem: Coping: Goal: Level of anxiety will decrease Outcome: Progressing   Problem: Elimination: Goal: Will not experience complications related to urinary retention Outcome: Progressing   Problem: Safety: Goal: Ability to remain free from injury will improve Outcome: Progressing   Problem: Skin Integrity: Goal: Risk for impaired skin integrity will decrease Outcome: Progressing   Problem: Coping: Goal: Ability to adjust to condition or change in health will improve Outcome: Progressing   Problem: Fluid Volume: Goal: Ability to maintain a balanced intake and output will improve Outcome: Progressing   Problem: Nutritional: Goal: Maintenance of adequate nutrition will improve Outcome: Progressing   Problem: Skin Integrity: Goal: Risk for impaired skin integrity will decrease Outcome: Progressing   Problem: Pain Managment: Goal: General experience of comfort will improve and/or be controlled Outcome: Not Progressing   Problem: Metabolic: Goal: Ability  to maintain appropriate glucose levels will improve Outcome: Not Progressing   Problem: Nutritional: Goal: Progress toward achieving an optimal weight will improve Outcome: Not Progressing

## 2024-02-10 NOTE — Progress Notes (Signed)
 Contacted Dr Harden, MD at [phone]  Notified that CBG is 172,(order states to notify if over 140) Per Dr Harden, MD this is a good blood sugar for this patient  Patient does not need bedtime coverage No new orders at this time

## 2024-02-10 NOTE — Op Note (Signed)
 02/10/2024  8:13 AM  PATIENT:  Troy [name]    PRE-OPERATIVE DIAGNOSIS:  dehiscence right  below knee amputation  POST-OPERATIVE DIAGNOSIS:  Same  PROCEDURE:  REVISION AMPUTATION, BELOW THE KNEE, APPLICATION, WOUND VAC Application Kerecis micro graft 38 cm.  SURGEON:  Jerona LULLA Sage, MD  PHYSICIAN ASSISTANT:None ANESTHESIA:   General  PREOPERATIVE INDICATIONS:  Troy [name] is a  63 y.o. male with a diagnosis of dehiscence right  below knee amputation who failed conservative measures and elected for surgical management.    The risks benefits and alternatives were discussed with the patient preoperatively including but not limited to the risks of infection, bleeding, nerve injury, cardiopulmonary complications, the need for revision surgery, among others, and the patient was willing to proceed.  OPERATIVE IMPLANTS:   Implant Name Type Inv. Item Serial No. Manufacturer Lot No. LRB No. Used Action  GRAFT SKIN WND MICRO 38 - [id number] Tissue GRAFT SKIN WND MICRO 38  [employer] [id number] Right 1 Implanted    @ENCIMAGES @  OPERATIVE FINDINGS: Patient had exposed distal tibia and fibula.  No purulent abscess.  Tissue margins were clear.  OPERATIVE PROCEDURE: Patient was brought the operating room and underwent a regional anesthetic.  Patient underwent a general anesthetic.  After adequate levels anesthesia were obtained patient's right lower extremity was prepped using DuraPrep draped into a sterile field a timeout was called.  A fishmouth incision was made around the ulcerative tissue this was carried sharply down to bone and the distal 2 cm of tibia and fibula were resected and beveled anteriorly.  Tissue margins were clear.  Electrocautery was used for hemostasis.  The wound was cleansed with Vashe.  The wound bed was then reinforced with 38 cm Kerecis micro graft.  Incision was closed using 2-0 nylon.  A peel in place wound VAC was applied this had a good suction fit patient was  extubated taken the PACU in stable condition.   DISCHARGE PLANNING:  Antibiotic duration: Antibiotics for 24 hours  Weightbearing: Nonweightbearing on the right  Pain medication: Opioid pathway  Dressing care/ Wound VAC: Wound VAC  Ambulatory devices: Walker  Discharge to: Discharge back to skilled nursing.  Follow-up: In the office 1 week post operative.

## 2024-02-10 NOTE — Anesthesia Preprocedure Evaluation (Signed)
 Anesthesia Evaluation  Patient identified by MRN, date of birth, ID band Patient awake    Reviewed: Allergy & Precautions, NPO status , Patient's Chart, lab work & pertinent test results  History of Anesthesia Complications Negative for: history of anesthetic complications  Airway Mallampati: II  TM Distance: >3 FB Neck ROM: Full    Dental  (+) Dental Advisory Given   Pulmonary COPD,  COPD inhaler, Patient abstained from smoking., former smoker   Pulmonary exam normal        Cardiovascular hypertension, + Peripheral Vascular Disease  Normal cardiovascular exam+ dysrhythmias Atrial Fibrillation    '24 TTE - EF 70 to 75%. The left ventricular internal cavity size was mildly dilated. There is mild concentric left ventricular hypertrophy of the basal-septal segment. Grade II diastolic dysfunction (pseudonormalization). Left atrial size was mildly dilated. Trivial mitral valve regurgitation. Aortic valve regurgitation is trivial.     Neuro/Psych  PSYCHIATRIC DISORDERS Anxiety Depression    negative neurological ROS     GI/Hepatic ,GERD  Medicated and Controlled,,(+) Cirrhosis         Endo/Other  diabetes, Type 2, Oral Hypoglycemic Agents, Insulin  Dependent    Renal/GU negative Renal ROS     Musculoskeletal negative musculoskeletal ROS (+)    Abdominal   Peds  Hematology  (+) Blood dyscrasia, anemia  On Plavix     Anesthesia Other Findings   Reproductive/Obstetrics                              Anesthesia Physical Anesthesia Plan  ASA: 3  Anesthesia Plan: General   Post-op Pain Management:    Induction: Intravenous  PONV Risk Score and Plan: 2 and Treatment may vary due to age or medical condition, Ondansetron  and Midazolam   Airway Management Planned: LMA  Additional Equipment: None  Intra-op Plan:   Post-operative Plan: Extubation in OR  Informed Consent: I have reviewed the  patients History and Physical, chart, labs and discussed the procedure including the risks, benefits and alternatives for the proposed anesthesia with the patient or authorized representative who has indicated his/her understanding and acceptance.       Plan Discussed with: CRNA and Anesthesiologist  Anesthesia Plan Comments: ( )        Anesthesia Quick Evaluation

## 2024-02-10 NOTE — Inpatient Diabetes Management (Signed)
 Inpatient Diabetes Program Recommendations  AACE/ADA: New Consensus Statement on Inpatient Glycemic Control (2015)  Target Ranges:  Prepandial:   less than 140 mg/dL      Peak postprandial:   less than 180 mg/dL (1-2 hours)      Critically ill patients:  140 - 180 mg/dL   Lab Results  Component Value Date   GLUCAP 183 (H) 02/10/2024   HGBA1C 7.5 (H) 02/10/2024    Review of Glycemic Control  Latest Reference Range & Units 02/10/24 06:00 02/10/24 08:18  Glucose-Capillary 70 - 99 mg/dL 788 (H) 816 (H)   Diabetes history: DM 2 Outpatient Diabetes medications:  [name]  10 mg daily, Glipizide  2.5 mg bid, Tresiba  13 units daily, Humalog  0-12 units tid with meals  Current orders for Inpatient glycemic control:  Novolog  0-15 units tid with meals  [name]  10 mg daily Glipizide  2.5 mg bid Lantus  13 units daily Inpatient Diabetes Program Recommendations:    Agree with current orders.  Will follow.   Thanks,  Randall Bullocks, RN, BC-ADM Inpatient Diabetes Coordinator Pager [phone]  (8a-5p)

## 2024-02-10 NOTE — Anesthesia Procedure Notes (Signed)
 Procedure Name: LMA Insertion Date/Time: 02/10/2024 7:28 AM  Performed by: Mollie Olivia SAUNDERS, CRNAPre-anesthesia Checklist: Patient identified, Emergency Drugs available, Suction available and Patient being monitored Patient Re-evaluated:Patient Re-evaluated prior to induction Oxygen Delivery Method: Circle System Utilized Preoxygenation: Pre-oxygenation with 100% oxygen Induction Type: IV induction LMA: LMA inserted LMA Size: 5.0 Number of attempts: 1 Placement Confirmation: positive ETCO2 Tube secured with: Tape Dental Injury: Teeth and Oropharynx as per pre-operative assessment

## 2024-02-10 NOTE — TOC Initial Note (Signed)
 Transition of Care [facility]) - Initial/Assessment Note    Patient Details  Name: Troy [name] MRN: [id number] Date of Birth: 04-08-1961  [facility]) CM/SW Contact:    Jeoffrey LITTIE [name] Phone Number: 02/10/2024, 4:13 PM  Clinical Narrative:                 Pt admitted from [facility] due to wound on amputation stump. CSW will continue to follow for needs.   Expected Discharge Plan: Skilled Nursing Facility Barriers to Discharge: Continued Medical Work up   Patient Goals and CMS Choice Patient states their goals for this hospitalization and ongoing recovery are:: Return to [facility]          Expected Discharge Plan and Services       Living arrangements for the past 2 months: Skilled Nursing Facility                                      Prior Living Arrangements/Services Living arrangements for the past 2 months: Skilled Nursing Facility Lives with:: Facility Resident Patient language and need for interpreter reviewed:: Yes Do you feel safe going back to the place where you live?: Yes      Need for Family Participation in Patient Care: Yes (Comment) Care giver support system in place?: Yes (comment)   Criminal Activity/Legal Involvement Pertinent to Current Situation/Hospitalization: No - Comment as needed  Activities of Daily Living   ADL Screening (condition at time of admission) Independently performs ADLs?: No Does the patient have a NEW difficulty with bathing/dressing/toileting/self-feeding that is expected to last >3 days?: Yes (Initiates electronic notice to provider for possible OT consult) Does the patient have a NEW difficulty with getting in/out of bed, walking, or climbing stairs that is expected to last >3 days?: Yes (Initiates electronic notice to provider for possible PT consult) Does the patient have a NEW difficulty with communication that is expected to last >3 days?: No Is the patient deaf or have difficulty hearing?: No Does  the patient have difficulty seeing, even when wearing glasses/contacts?: No Does the patient have difficulty concentrating, remembering, or making decisions?: No  Permission Sought/Granted Permission sought to share information with : Facility Medical sales representative, Family Supports    Share Information with NAME: [name]  Permission granted to share info w AGENCY: [facility]  Permission granted to share info w Relationship: spouse  Permission granted to share info w Contact Information: [phone]  Emotional Assessment Appearance:: Appears stated age     Orientation: : Oriented to Self, Oriented to Place, Oriented to  Time, Oriented to Situation Alcohol / Substance Use: Not Applicable Psych Involvement: No (comment)  Admission diagnosis:  Dehiscence of amputation stump (HCC) [T87.81] Non-healing wound of amputation stump (HCC) [T87.89] Patient Active Problem List   Diagnosis Date Noted   Non-healing wound of amputation stump (HCC) 02/10/2024   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   Ventricular aneurysm 04/08/2023   (HFpEF) heart failure with preserved ejection fraction (HCC) 04/02/2023   Lactic acidosis 04/02/2023   Acute respiratory failure with hypoxia (HCC) 04/02/2023   Leukocytosis 04/02/2023   Uncontrolled type 2 diabetes mellitus with hyperglycemia, with long-term current use of insulin  (HCC) 04/02/2023   Hypochromic anemia 04/02/2023   PVD (peripheral vascular disease) (HCC) 04/02/2023   Thrombocytosis 04/02/2023   Respiratory distress 03/21/2023   Osteomyelitis of ankle or foot, acute, right (HCC) 03/11/2023   Abscess of right  lower leg 03/11/2023   Below-knee amputation of right lower extremity (HCC) 03/11/2023   Chronic respiratory failure with hypoxia (HCC) 02/12/2023   Pulmonary infiltrates 02/12/2023   Acute on chronic heart failure with preserved ejection fraction (HFpEF) (HCC) 02/12/2023   Chronic osteomyelitis of right foot with draining sinus (HCC) 02/07/2023   Paroxysmal  atrial flutter (HCC) 02/05/2023   Hypotension 02/05/2023   Atrial fibrillation with RVR (HCC) 02/04/2023   Atrial flutter (HCC) 02/04/2023   Lower limb ischemia: ??? 08/22/2021   Diabetic acidosis without coma (HCC)    Hypokalemia    Hypomagnesemia    Hyponatremia    Multifocal pneumonia    Parapneumonic effusion 08/18/2021   Bowel Ileus (HCC) 08/18/2021   ?? 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   Ketoacidosis due to type 2 diabetes mellitus (HCC) 08/15/2021   COVID-19 virus infection 08/15/2021   S/P AKA (above knee amputation), left (HCC) 02/12/2021   Amputation of right great toe and 2nd Toe 02/12/2021   Sepsis due to left-sided neck cellulitis/MRSA 02/12/2021   Hypotensive episode    Elevated troponin    SIRS (systemic inflammatory response syndrome) (HCC) 02/11/2021   Abscess of bursa of left ankle 01/21/2021   Dehiscence of amputation stump (HCC)    Ischemia of left BKA site (HCC)    Abscess of leg without foot, left    Osteomyelitis of second toe of right foot (HCC) 07/12/2019   Below knee amputation status, left 01/25/2018   Dehiscence of amputation stump of right lower extremity (HCC)    Status post left foot surgery 12/15/2017   GERD (gastroesophageal reflux disease) 09/26/2017   Hyperlipidemia associated with type 2 diabetes mellitus (HCC) 07/08/2017   Snoring 06/07/2016   Chest tube in place 01/22/2016   Abnormal nuclear stress test 01/22/2016   Pain, chronic due to trauma 07/04/2012   Complex regional pain syndrome I of lower limb 07/04/2012   COPD (chronic obstructive pulmonary disease) (HCC) 01/27/2011   Pre-operative cardiovascular examination 01/27/2011   Nonspecific abnormal electrocardiogram (ECG) (EKG) 01/27/2011   Murmur 01/27/2011   DM2 (diabetes mellitus, type 2) (HCC)    HTN (hypertension)    HLD (hyperlipidemia)    Tobacco abuse    PCP:  Baird Comer GAILS, NP Pharmacy:   [phone] - [place], [place] - [address]  [place] [address] Phone: [phone] Fax: [phone]  [facility] - [place], [place] - [address] [place] [address] [place] [address] [place] [address] Phone: [phone] Fax: [phone]     Social Drivers of Health (SDOH) Social History: SDOH Screenings   Food Insecurity: No Food Insecurity (02/10/2024)  Housing: Low Risk  (02/10/2024)  Transportation Needs: No Transportation Needs (02/10/2024)  Utilities: Not At Risk (02/10/2024)  Financial Resource Strain: Low Risk  (04/09/2021)   Received from [facility]  Physical Activity: Insufficiently Active (04/09/2021)   Received from [facility]  Social Connections: Unknown (10/22/2021)   Received from [facility]  Stress: No Stress Concern Present (04/09/2021)   Received from [facility]  Tobacco Use: Medium Risk (02/10/2024)   SDOH Interventions:     Readmission Risk Interventions    08/25/2021   12:46 PM  Readmission Risk Prevention Plan  Transportation Screening Complete  Medication Review (RN Care Manager) Complete  PCP or Specialist appointment within 3-5 days of discharge Complete  HRI or Home Care Consult Complete  SW Recovery Care/Counseling Consult Complete  Palliative Care Screening Not Applicable  Skilled Nursing Facility Complete

## 2024-02-10 NOTE — Transfer of Care (Signed)
 Immediate Anesthesia Transfer of Care Note  Patient: Troy [name]  Procedure(s) Performed: REVISION AMPUTATION, BELOW THE KNEE (Right: Leg Lower) APPLICATION, WOUND VAC (Right: Leg Lower)  Patient Location: PACU  Anesthesia Type:General  Level of Consciousness: awake and sedated  Airway & Oxygen Therapy: Patient Spontanous Breathing and Patient connected to face mask oxygen  Post-op Assessment: Report given to RN and Post -op Vital signs reviewed and stable  Post vital signs: Reviewed and stable  Last Vitals:  Vitals Value Taken Time  BP    Temp    Pulse 78 02/10/24 08:22  Resp 26 02/10/24 08:22  SpO2 100 % 02/10/24 08:22  Vitals shown include unfiled device data.  Last Pain:  Vitals:   02/10/24 0630  TempSrc:   PainSc: 7          Complications: No notable events documented.

## 2024-02-10 NOTE — Interval H&P Note (Signed)
 History and Physical Interval Note:  02/10/2024 6:43 AM  Troy [name]  has presented today for surgery, with the diagnosis of dehiscence right  below knee amputation.  The various methods of treatment have been discussed with the patient and family. After consideration of risks, benefits and other options for treatment, the patient has consented to  Procedure(s): REVISION AMPUTATION, BELOW THE KNEE (Right) APPLICATION, WOUND VAC (Right) as a surgical intervention.  The patient's history has been reviewed, patient examined, no change in status, stable for surgery.  I have reviewed the patient's chart and labs.  Questions were answered to the patient's satisfaction.     Ladelle Teodoro V [name]

## 2024-02-10 NOTE — Anesthesia Postprocedure Evaluation (Signed)
 Anesthesia Post Note  Patient: Troy [name]  Procedure(s) Performed: REVISION AMPUTATION, BELOW THE KNEE (Right: Leg Lower) APPLICATION, WOUND VAC (Right: Leg Lower)     Patient location during evaluation: PACU Anesthesia Type: General Level of consciousness: awake and alert Pain management: pain level controlled Vital Signs Assessment: post-procedure vital signs reviewed and stable Respiratory status: spontaneous breathing, nonlabored ventilation and respiratory function stable Cardiovascular status: blood pressure returned to baseline and stable Postop Assessment: no apparent nausea or vomiting Anesthetic complications: no   No notable events documented.  Last Vitals:  Vitals:   02/10/24 0915 02/10/24 0920  BP: (!) 167/94   Pulse: 98 (!) 105  Resp: (!) 0 14  Temp:  36.6 C  SpO2: 99% 99%    Last Pain:  Vitals:   02/10/24 0915  TempSrc:   PainSc: 10-Worst pain ever                 Butler Levander Pinal

## 2024-02-11 LAB — BASIC METABOLIC PANEL WITH GFR
Anion gap: 10 (ref 5–15)
BUN: 10 mg/dL (ref 8–23)
CO2: 26 mmol/L (ref 22–32)
Calcium: 8.5 mg/dL — ABNORMAL LOW (ref 8.9–10.3)
Chloride: 101 mmol/L (ref 98–111)
Creatinine, Ser: 0.73 mg/dL (ref 0.61–1.24)
GFR, Estimated: >60 mL/min (ref >60)
Glucose, Bld: 163 mg/dL — ABNORMAL HIGH (ref 70–99)
Potassium: 3.5 mmol/L (ref 3.5–5.1)
Sodium: 137 mmol/L (ref 135–145)

## 2024-02-11 LAB — GLUCOSE, CAPILLARY
Glucose-Capillary: 182 mg/dL — ABNORMAL HIGH (ref 70–99)
Glucose-Capillary: 164 mg/dL — ABNORMAL HIGH (ref 70–99)
Glucose-Capillary: 155 mg/dL — ABNORMAL HIGH (ref 70–99)
Glucose-Capillary: 159 mg/dL — ABNORMAL HIGH (ref 70–99)

## 2024-02-11 MED ORDER — OXYCODONE HCL 5 MG PO TABS
5.0000 mg | ORAL_TABLET | ORAL | Status: DC | PRN
  Administered 2024-02-11: 5 mg via ORAL
  Administered 2024-02-12 (×2): 10 mg via ORAL
  Filled 2024-02-11 (×2): qty 2
  Filled 2024-02-11: qty 1

## 2024-02-11 MED ORDER — OXYCODONE-ACETAMINOPHEN 5-325 MG PO TABS
1.0000 | ORAL_TABLET | ORAL | Status: DC | PRN
  Administered 2024-02-11 (×4): 2 via ORAL
  Filled 2024-02-11 (×5): qty 2

## 2024-02-11 NOTE — Plan of Care (Signed)
  Problem: Health Behavior/Discharge Planning: Goal: Ability to manage health-related needs will improve Outcome: Progressing   Problem: Coping: Goal: Level of anxiety will decrease Outcome: Progressing   Problem: Activity: Goal: Risk for activity intolerance will decrease Outcome: Not Progressing   Problem: Pain Managment: Goal: General experience of comfort will improve and/or be controlled Outcome: Not Progressing

## 2024-02-11 NOTE — Evaluation (Signed)
 Physical Therapy Evaluation Patient Details Name: Troy [name] MRN: [id number] DOB: Jul 13, 1960 Today's Date: 02/11/2024  History of Present Illness  Troy [name] is a 63 y.o. male admitted 02/10/24 with a diagnosis of dehiscence right  below knee amputation who failed conservative measures and elected for surgical management. Pt s/p BKA revision and wound vac application 8/22. PMHx: CHF, COPD, T2DM, HTN, PVD, restless leg syndrome, anxiety, depression; L AKA (2022), R BKA (2024).   Clinical Impression  Pt admitted with above diagnosis. PTA, pt required assistance with functional mobility using a manual w/c and ADLs/IADLs. He has been residing at [facility] receiving therapy services. Pt reports he has been working on transfer training lately with PT. Pt currently with functional limitations due to the deficits listed below (see PT Problem List). He required close supervision-SBA for bed mobility and anterior-posterior bed>chair transfer. Pt heavily depends on BUE support. Pt will benefit from acute skilled PT to increase his independence and safety with mobility to allow discharge. Recommend continued inpatient follow up therapy, <3 hours/day.    If plan is discharge home, recommend the following: A little help with walking and/or transfers;A little help with bathing/dressing/bathroom;Assistance with cooking/housework;Assist for transportation;Help with stairs or ramp for entrance   Can travel by private vehicle   No    Equipment Recommendations Hospital bed;Wheelchair (measurements PT);Wheelchair cushion (measurements PT)  Recommendations for Other Services       Functional Status Assessment Patient has had a recent decline in their functional status and demonstrates the ability to make significant improvements in function in a reasonable and predictable amount of time.     Precautions / Restrictions Precautions Precautions: Fall Recall of Precautions/Restrictions:  Intact Precaution/Restrictions Comments: Wound Vac RLE Restrictions Weight Bearing Restrictions Per Provider Order: Yes RLE Weight Bearing Per Provider Order: Non weight bearing      Mobility  Bed Mobility Overal bed mobility: Needs Assistance Bed Mobility: Supine to Sit     Supine to sit: HOB elevated, Used rails, Supervision     General bed mobility comments: Pt sat up on R side of bed with heavy reliance on UE support. Pt pulled on L bedrail and then slowly pivoted to EOB scooting with BUE support. No physical assist or cues sequencing. Supervision for safety.    Transfers Overall transfer level: Needs assistance Equipment used: None Transfers: Bed to chair/wheelchair/BSC         Anterior-Posterior transfers: Supervision   General transfer comment: Set-up assistance. PT aligned recliner chair. Pt turned and completetly sideways and scooted backwards with BUE support on bed transitioning to arm rests. No physical assist or cues sequencing. Supervision for safety.    Ambulation/Gait                  Stairs            Wheelchair Mobility     Tilt Bed    Modified Rankin (Stroke Patients Only)       Balance Overall balance assessment: Needs assistance Sitting-balance support: Bilateral upper extremity supported, Feet unsupported Sitting balance-Leahy Scale: Fair                                       Pertinent Vitals/Pain Pain Assessment Pain Assessment: Faces Faces Pain Scale: Hurts little more Pain Location: RLE Pain Descriptors / Indicators: Operative site guarding, Discomfort, Aching Pain Intervention(s): RN gave pain meds during session, Monitored during  session, Limited activity within patient's tolerance, Repositioned    Home Living Family/patient expects to be discharged to:: Skilled nursing facility [facility])                   Additional Comments: Pt has been recieving therapy services.    Prior Function  Prior Level of Function : Needs assist;Patient poor historian/Family not available (Unsure of the accuracy of information obtained on PLOF. Pt somewhat guarded providing quickl and simple answers to questions.)             Mobility Comments: Pt reports that he only requires supervision from SNF staff for mobility. Pt reports he uses bed rails to complete bed mobility and will scoot to to transfer to  w/c. Pt states he has a L prosthetic leg, but rarely wears it and cannot recall the last time he stood. Pt denies fall history. Reports practicing transfers with PT. ADLs Comments: Pt reports that he only requires set up from SNF staff for ADLs/selfcare. He uses the bedpan for toileting and sponge baths for hygiene. Staff manage all IADLs.     Extremity/Trunk Assessment   Upper Extremity Assessment Upper Extremity Assessment: Defer to OT evaluation    Lower Extremity Assessment Lower Extremity Assessment: Generalized weakness;RLE deficits/detail RLE Deficits / Details: Pt s/p BKA. Hip and Knee AROM WFL. Grossly 3/5 strength. RLE: Unable to fully assess due to pain RLE Coordination: decreased gross motor       Communication   Communication Communication: No apparent difficulties    Cognition Arousal: Alert Behavior During Therapy: Flat affect   PT - Cognitive impairments: No family/caregiver present to determine baseline                       PT - Cognition Comments: Pt A,Ox4 Following commands: Intact       Cueing Cueing Techniques: Verbal cues     General Comments      Exercises     Assessment/Plan    PT Assessment Patient needs continued PT services  PT Problem List Decreased strength;Decreased range of motion;Decreased activity tolerance;Decreased balance;Decreased mobility       PT Treatment Interventions DME instruction;Functional mobility training;Therapeutic activities;Therapeutic exercise;Balance training;Patient/family education;Wheelchair  mobility training    PT Goals (Current goals can be found in the Care Plan section)  Acute Rehab PT Goals Patient Stated Goal: Return Home PT Goal Formulation: With patient Time For Goal Achievement: 02/25/24 Potential to Achieve Goals: Fair Additional Goals Additional Goal #1: Pt will propel manual w/c 13ft with modI.    Frequency Min 2X/week     Co-evaluation               AM-PAC PT 6 Clicks Mobility  Outcome Measure Help needed turning from your back to your side while in a flat bed without using bedrails?: A Little Help needed moving from lying on your back to sitting on the side of a flat bed without using bedrails?: A Little Help needed moving to and from a bed to a chair (including a wheelchair)?: A Little Help needed standing up from a chair using your arms (e.g., wheelchair or bedside chair)?: Total Help needed to walk in hospital room?: Total Help needed climbing 3-5 steps with a railing? : Total 6 Click Score: 12    End of Session   Activity Tolerance: Patient tolerated treatment well Patient left: in chair;with call bell/phone within reach Nurse Communication: Mobility status PT Visit Diagnosis: Other abnormalities of gait  and mobility (R26.89);Muscle weakness (generalized) (M62.81)    Time: 8482-8467 PT Time Calculation (min) (ACUTE ONLY): 15 min   Charges:   PT Evaluation $PT Eval Moderate Complexity: 1 Mod   PT General Charges $$ ACUTE PT VISIT: 1 Visit         Randall SAUNDERS, PT, DPT Acute Rehabilitation Services Office: [phone] Secure Chat Preferred  Delon CHRISTELLA Callander 02/11/2024, 4:24 PM

## 2024-02-11 NOTE — Plan of Care (Signed)

## 2024-02-11 NOTE — Progress Notes (Signed)
 Contacted Dr Harden concerning patient request for percocet. Dr Harden sent verbal order and Charge nurse, Liliana Crater, RN  was notified to place order.

## 2024-02-11 NOTE — Progress Notes (Signed)
 Patient ID: Troy [name], male   DOB: 1961/05/21, 63 y.o.   MRN: [id number] Patient is postoperative day 1 revision transtibial amputation.  There is 100 cc in the wound VAC canister with a good suction fit.  Plan for discharge back to skilled nursing once the drainage has diminished.  Will change oxycodone  to Percocet as per patient's request.

## 2024-02-11 NOTE — Evaluation (Signed)
 Occupational Therapy Evaluation Patient Details Name: Troy [name] MRN: [id number] DOB: 01/23/1961 Today's Date: 02/11/2024   History of Present Illness   Patient is a 63 year old gentleman who is status post right below-knee amputation.  Patient has undergone prolonged wound care with persistent ulceration. 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 S/p revision of right BKA.     Clinical Impressions Pt presents with decline in function and safety with ADLs and ADL mobility with impaired balance and endurance. Pt required gentle encouragement to participate in EOB activity. Pt seemed agitated with therapist asking PLOF questions and kept stating, I'm not going back to the nursing home, I'm going home. OT asked pt if SNF had discussed d/c home with him and pt replied,  I'm only up there cause I don't have any legs, but I am still going home. Pt reports that he only requires set up from SNF staff for ADLs/selfcare and that he uses a w/c and scoots to transfer with no assist from SNF staff, but earlier stated that he has 2 prostheitic LEs at facility, but uncertain of accuracy of this info as pt is a poor historian. Pt currently requires CGA with heavy use of bed rail to sit EOB, CGA with use of rail to steady self seated, CGA with UB and LB ADLs. Pt declined transfers at this time due to R LE pain; pt had been pre medicated, RN notified.  OT will follow acutely to maximize level of function and safety   If plan is discharge home, recommend the following:   A little help with bathing/dressing/bathroom;A lot of help with walking and/or transfers;Assist for transportation;Help with stairs or ramp for entrance     Functional Status Assessment   Patient has had a recent decline in their functional status and demonstrates the ability to make significant improvements in function in a reasonable and predictable amount of time.     Equipment Recommendations   None  recommended by OT     Recommendations for Other Services         Precautions/Restrictions   Precautions Precautions: Fall;Other (comment) Recall of Precautions/Restrictions: Intact Precaution/Restrictions Comments: Wonud VAC R LE Restrictions Weight Bearing Restrictions Per Provider Order: Yes RLE Weight Bearing Per Provider Order: Non weight bearing LLE Weight Bearing Per Provider Order: Non weight bearing     Mobility Bed Mobility Overal bed mobility: Needs Assistance Bed Mobility: Supine to Sit, Sit to Supine     Supine to sit: Contact guard, HOB elevated, Used rails Sit to supine: Contact guard assist   General bed mobility comments: np physical assist, required increased time and effort, pt insisted on using lower bed rail to long sit initially    Transfers                   General transfer comment: declined      Balance Overall balance assessment: Needs assistance   Sitting balance-Leahy Scale: Fair Sitting balance - Comments: reliant on upper bed rail initially to steady self                                   ADL either performed or assessed with clinical judgement   ADL Overall ADL's : Needs assistance/impaired Eating/Feeding: Sitting;Bed level   Grooming: Wash/dry hands;Wash/dry face;Contact guard assist;Sitting   Upper Body Bathing: Contact guard assist;Sitting   Lower Body Bathing: Contact guard assist;Sitting/lateral leans   Upper Body  Dressing : Contact guard assist;Sitting   Lower Body Dressing: Minimal assistance;Contact guard assist;Sitting/lateral leans     Toilet Transfer Details (indicate cue type and reason): declined Toileting- Clothing Manipulation and Hygiene: Contact guard assist;Sitting/lateral lean         General ADL Comments: pt seemed agitated with therapist asking PLOF questions and kept stating, I'm not going back to the nursing home, I'm going home     Vision Baseline Vision/History: 1  Wears glasses Ability to See in Adequate Light: 0 Adequate Patient Visual Report: No change from baseline       Perception         Praxis         Pertinent Vitals/Pain Pain Assessment Pain Assessment: 0-10 Pain Score: 8  Pain Location: 7-8/10 R LE, pt was pre medicated Pain Descriptors / Indicators: Constant, Throbbing, Discomfort Pain Intervention(s): Limited activity within patient's tolerance, Monitored during session, Premedicated before session, Repositioned     Extremity/Trunk Assessment Upper Extremity Assessment Upper Extremity Assessment: Right hand dominant   Lower Extremity Assessment Lower Extremity Assessment: Defer to PT evaluation       Communication Communication Communication: No apparent difficulties   Cognition Arousal: Alert Behavior During Therapy: Flat affect Cognition: No family/caregiver present to determine baseline             OT - Cognition Comments: pt seemed agitated with therapist asking PLOF questions and kept stating, I'm not going back to the nursing home, I'm going home. OT asked pt if SNF had discussed d/c home with him and pt replied,  I'm only up there cause I don't have any legs, but I am still going home.                 Following commands: Intact       Cueing  General Comments   Cueing Techniques: Verbal cues      Exercises     Shoulder Instructions      Home Living Family/patient expects to be discharged to:: Skilled nursing facility                                 Additional Comments: Pt reports that he does not live at the SNF and that he went home and went back to SNF since hospitalization 10-11 months ago, however chart notes that pt living at [facility] SNF since October 2024      Prior Functioning/Environment               Mobility Comments: Pt reports he uses a w/c and scoots to transfer with no assist from SNF staff, but earlier stated that he has 2 prostheitic LEs at  facility, but uncertain of accuracy of this info as pt is a poor historian ADLs Comments: Pt reports that he only requires set up from SNF staff for ADLs/selfcare    OT Problem List: Impaired balance (sitting and/or standing);Pain;Decreased activity tolerance;Decreased knowledge of use of DME or AE;Obesity   OT Treatment/Interventions: Self-care/ADL training;Therapeutic exercise;Patient/family education;Balance training;Therapeutic activities;DME and/or AE instruction      OT Goals(Current goals can be found in the care plan section)   Acute Rehab OT Goals Patient Stated Goal: go home OT Goal Formulation: With patient Time For Goal Achievement: 02/25/24 Potential to Achieve Goals: Good ADL Goals Pt Will Perform Grooming: with supervision;with set-up;sitting Pt Will Perform Upper Body Bathing: with supervision;with set-up Pt Will Perform Lower Body Bathing: with  supervision;with set-up;sitting/lateral leans Pt Will Perform Upper Body Dressing: with supervision;with set-up;sitting Pt Will Perform Lower Body Dressing: with supervision;with set-up;sitting/lateral leans Pt Will Transfer to Toilet: with max assist;with mod assist;with transfer board Pt Will Perform Toileting - Clothing Manipulation and hygiene: with supervision;with modified independence;sitting/lateral leans   OT Frequency:  Min 2X/week    Co-evaluation              AM-PAC OT 6 Clicks Daily Activity     Outcome Measure Help from another person eating meals?: None Help from another person taking care of personal grooming?: A Little Help from another person toileting, which includes using toliet, bedpan, or urinal?: A Little Help from another person bathing (including washing, rinsing, drying)?: A Little Help from another person to put on and taking off regular upper body clothing?: A Little Help from another person to put on and taking off regular lower body clothing?: A Little 6 Click Score: 19   End of  Session Nurse Communication: Mobility status  Activity Tolerance: Patient limited by pain;Treatment limited secondary to agitation Patient left: in bed  OT Visit Diagnosis: Pain;Other abnormalities of gait and mobility (R26.89);Muscle weakness (generalized) (M62.81) Pain - Right/Left: Right Pain - part of body: Leg                Time: 1020-1044 OT Time Calculation (min): 24 min Charges:  OT General Charges $OT Visit: 1 Visit OT Evaluation $OT Eval Moderate Complexity: 1 Mod OT Treatments $Self Care/Home Management : 8-22 mins    Jacques Karna Loose 02/11/2024, 11:55 AM

## 2024-02-11 NOTE — Progress Notes (Signed)
 Per CN, Noe Riling, RN patient doesn't have any orders for contact precautions. Contact precaution sign removed from patient's room door. 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

## 2024-02-12 LAB — BASIC METABOLIC PANEL WITH GFR
Anion gap: 8 (ref 5–15)
BUN: 9 mg/dL (ref 8–23)
CO2: 29 mmol/L (ref 22–32)
Calcium: 8.6 mg/dL — ABNORMAL LOW (ref 8.9–10.3)
Chloride: 101 mmol/L (ref 98–111)
Creatinine, Ser: 0.74 mg/dL (ref 0.61–1.24)
GFR, Estimated: >60 mL/min (ref >60)
Glucose, Bld: 139 mg/dL — ABNORMAL HIGH (ref 70–99)
Potassium: 3.5 mmol/L (ref 3.5–5.1)
Sodium: 138 mmol/L (ref 135–145)

## 2024-02-12 LAB — GLUCOSE, CAPILLARY
Glucose-Capillary: 150 mg/dL — ABNORMAL HIGH (ref 70–99)
Glucose-Capillary: 137 mg/dL — ABNORMAL HIGH (ref 70–99)
Glucose-Capillary: 115 mg/dL — ABNORMAL HIGH (ref 70–99)
Glucose-Capillary: 142 mg/dL — ABNORMAL HIGH (ref 70–99)

## 2024-02-12 MED ORDER — OXYCODONE-ACETAMINOPHEN 5-325 MG PO TABS: 1.0000 | ORAL_TABLET | ORAL | 0 refills | Status: DC | PRN

## 2024-02-12 NOTE — Progress Notes (Signed)
 PRN potassium chloride  40mEq given.

## 2024-02-12 NOTE — TOC Progression Note (Signed)
 Transition of Care [facility]) - Progression Note    Patient Details  Name: Troy [name] MRN: [id number] Date of Birth: Nov 01, 1960  [facility] CM/SW Contact  Dino CHRISTELLA Au, LCSWA Phone Number: 02/12/2024, 11:39 AM  Clinical Narrative:     SW spoke with Dorothe [facility] [phone]) shara started. Mzq#[id number]  Clinicals faxed to [phone]   Expected Discharge Plan: Skilled Nursing Facility Barriers to Discharge: Continued Medical Work up               Expected Discharge Plan and Services       Living arrangements for the past 2 months: Skilled Nursing Facility Expected Discharge Date: 02/13/24                                     Social Drivers of Health (SDOH) Interventions SDOH Screenings   Food Insecurity: No Food Insecurity (02/10/2024)  Housing: Low Risk  (02/10/2024)  Transportation Needs: No Transportation Needs (02/10/2024)  Utilities: Not At Risk (02/10/2024)  Financial Resource Strain: Low Risk  (04/09/2021)   Received from [facility]  Physical Activity: Insufficiently Active (04/09/2021)   Received from [facility]  Social Connections: Unknown (10/22/2021)   Received from [facility]  Stress: No Stress Concern Present (04/09/2021)   Received from [facility]  Tobacco Use: Medium Risk (02/10/2024)    Readmission Risk Interventions    08/25/2021   12:46 PM  Readmission Risk Prevention Plan  Transportation Screening Complete  Medication Review (RN Care Manager) Complete  PCP or Specialist appointment within 3-5 days of discharge Complete  HRI or Home Care Consult Complete  SW Recovery Care/Counseling Consult Complete  Palliative Care Screening Not Applicable  Skilled Nursing Facility Complete

## 2024-02-12 NOTE — Plan of Care (Signed)
  Problem: Education: Goal: Knowledge of General Education information will improve Description: Including pain rating scale, medication(s)/side effects and non-pharmacologic comfort measures Outcome: Progressing   Problem: Health Behavior/Discharge Planning: Goal: Ability to manage health-related needs will improve Outcome: Progressing   Problem: Nutrition: Goal: Adequate nutrition will be maintained Outcome: Progressing   Problem: Pain Managment: Goal: General experience of comfort will improve and/or be controlled Outcome: Progressing   Problem: Activity: Goal: Ability to perform//tolerate increased activity and mobilize with assistive devices will improve Outcome: Progressing

## 2024-02-12 NOTE — Plan of Care (Signed)

## 2024-02-12 NOTE — Progress Notes (Signed)
 Patient's potassium level 3.5 per lab results at 0534.

## 2024-02-12 NOTE — Progress Notes (Signed)
 Secure chat received from Honora Brazier, [facility] advise to give PRN potassium based on levels.

## 2024-02-12 NOTE — Progress Notes (Signed)
 Patient ID: Troy [name], male   DOB: 1961-01-13, 63 y.o.   MRN: [id number] Patient is status post revision transtibial amputation.  There is 150 cc in the wound VAC canister with a good suction fit.  Prescription on the chart for Percocet.  Plan for discharge back to skilled nursing tomorrow.

## 2024-02-13 ENCOUNTER — Encounter (HOSPITAL_COMMUNITY): Payer: Self-pay | Admitting: Orthopedic Surgery

## 2024-02-13 LAB — BASIC METABOLIC PANEL WITH GFR
Anion gap: 10 (ref 5–15)
BUN: 15 mg/dL (ref 8–23)
CO2: 27 mmol/L (ref 22–32)
Calcium: 8.8 mg/dL — ABNORMAL LOW (ref 8.9–10.3)
Chloride: 101 mmol/L (ref 98–111)
Creatinine, Ser: 0.78 mg/dL (ref 0.61–1.24)
GFR, Estimated: >60 mL/min (ref >60)
Glucose, Bld: 84 mg/dL (ref 70–99)
Potassium: 3.7 mmol/L (ref 3.5–5.1)
Sodium: 138 mmol/L (ref 135–145)

## 2024-02-13 LAB — GLUCOSE, CAPILLARY
Glucose-Capillary: 152 mg/dL — ABNORMAL HIGH (ref 70–99)
Glucose-Capillary: 130 mg/dL — ABNORMAL HIGH (ref 70–99)
Glucose-Capillary: 134 mg/dL — ABNORMAL HIGH (ref 70–99)
Glucose-Capillary: 141 mg/dL — ABNORMAL HIGH (ref 70–99)

## 2024-02-13 NOTE — Progress Notes (Signed)
 Patient ID: Troy [name], male   DOB: 13-Dec-1960, 63 y.o.   MRN: [id number] Patient is status post revision transtibial amputation.  There is no new drainage in the wound VAC canister currently 150 cc.  Plan for discharge back to skilled nursing today with a Prevena plus portable wound VAC pump.  Prescription for Percocet on the chart.

## 2024-02-13 NOTE — Plan of Care (Signed)
  Problem: Education: Goal: Knowledge of General Education information will improve Description: Including pain rating scale, medication(s)/side effects and non-pharmacologic comfort measures Outcome: Progressing   Problem: Health Behavior/Discharge Planning: Goal: Ability to manage health-related needs will improve Outcome: Progressing   Problem: Nutrition: Goal: Adequate nutrition will be maintained Outcome: Progressing   Problem: Pain Managment: Goal: General experience of comfort will improve and/or be controlled Outcome: Progressing

## 2024-02-13 NOTE — Care Management Important Message (Signed)
 Important Message  Patient Details  Name: Troy [name] MRN: [id number] Date of Birth: 08-26-1960   Important Message Given:  Yes - Medicare IM     Jon Cruel 02/13/2024, 3:14 PM

## 2024-02-13 NOTE — Plan of Care (Signed)

## 2024-02-13 NOTE — Discharge Summary (Signed)
 Physician Discharge Summary  Patient ID: Troy [name] MRN: [id number] DOB/AGE: 01/01/1961 63 y.o.  Admit date: 02/10/2024 Discharge date: 02/13/2024  Admission Diagnoses:  Principal Problem:   Non-healing wound of amputation stump (HCC) Active Problems:   Dehiscence of amputation stump of right lower extremity [facility])   Discharge Diagnoses:  Same  Past Medical History:  Diagnosis Date   Acute renal failure (HCC)    in setting of NSAID use and orthopedic surgery 2010   Anxiety and depression    Chronic diastolic CHF (congestive heart failure) (HCC)    a. Echo 6/17: severe conc LVH, vigorous EF, EF 65-70%, no dynamic obstruction, no RWMA, Gr 1 DD, mild TR  //  b. 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 LHC 2002: irregs  //  b. LHC in 8/17: no sig CAD, apical DK, hyperdynamic LV, LVEDP 28   History of kidney stones    History of nuclear stress test    a. Nuc 7/17: Overall, intermediate risk nuclear stress test secondary to small size of apical lateral defect and reduced ejection fraction.  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    Kidney calculi    Palpitations    Pneumonia    PTSD (post-traumatic stress disorder)    Tobacco abuse    Transient ischemic attack     Surgeries: Procedure(s): REVISION AMPUTATION, BELOW THE KNEE APPLICATION, WOUND VAC on 02/10/2024   Consultants:   Discharged Condition: Improved  Hospital Course: Troy [name] is an 63 y.o. male who was admitted 02/10/2024 with a chief complaint of No  chief complaint on file. , and found to have a diagnosis of Non-healing wound of amputation stump (HCC).  They were brought to the operating room on 02/10/2024 and underwent the above named procedures.    They were given perioperative antibiotics:  Anti-infectives (From admission, onward)    Start     Dose/Rate Route Frequency Ordered Stop   02/10/24 1530  ceFAZolin  (ANCEF ) IVPB 2g/100 mL premix        2 g 200 mL/hr over 30 Minutes Intravenous Every 8 hours 02/10/24 1054 02/10/24 2344   02/10/24 0600  ceFAZolin  (ANCEF ) IVPB 2g/100 mL premix        2 g 200 mL/hr over 30 Minutes Intravenous On call to O.R. 02/10/24 [id number] 02/10/24 0735     .  They were given compression stockings, early ambulation, and chemoprophylaxis for DVT prophylaxis.  They benefited maximally from their hospital stay and there were no complications.    Recent vital signs:  Vitals:   02/12/24 1900 02/13/24 0430  BP: 115/69 104/65  Pulse: 93 95  Resp: 20 16  Temp: 98.4 F (36.9 C) 98.3 F (36.8 C)  SpO2: 96% 95%    Recent laboratory studies:  Results for orders placed or performed during the hospital encounter of 02/10/24  Glucose, capillary  Collection Time: 02/10/24  6:00 AM  Result Value Ref Range   Glucose-Capillary 211 (H) 70 - 99 mg/dL  CBC WITH DIFFERENTIAL   Collection Time: 02/10/24  6:12 AM  Result Value Ref Range   WBC 13.1 (H) 4.0 - 10.5 K/uL   RBC 5.23 4.22 - 5.81 MIL/uL   Hemoglobin 11.5 (L) 13.0 - 17.0 g/dL   HCT 61.1 (L) 60.9 - 47.9 %   MCV 74.2 (L) 80.0 - 100.0 fL   MCH 22.0 (L) 26.0 - 34.0 pg   MCHC 29.6 (L) 30.0 - 36.0 g/dL   RDW 82.7 (H) 88.4 - 84.4 %   Platelets 255 150 - 400 K/uL   nRBC 0.0 0.0 - 0.2 %   Neutrophils Relative % 64 %   Neutro Abs 8.6 (H) 1.7 - 7.7 K/uL   Lymphocytes Relative 23 %   Lymphs Abs 3.0 0.7 - 4.0 K/uL   Monocytes Relative 8 %   Monocytes Absolute 1.0 0.1 - 1.0 K/uL   Eosinophils Relative 3 %   Eosinophils Absolute 0.4 0.0 - 0.5 K/uL   Basophils  Relative 1 %   Basophils Absolute 0.1 0.0 - 0.1 K/uL   Immature Granulocytes 1 %   Abs Immature Granulocytes 0.06 0.00 - 0.07 K/uL  Comprehensive metabolic panel   Collection Time: 02/10/24  6:12 AM  Result Value Ref Range   Sodium 140 135 - 145 mmol/L   Potassium 3.6 3.5 - 5.1 mmol/L   Chloride 107 98 - 111 mmol/L   CO2 23 22 - 32 mmol/L   Glucose, Bld 209 (H) 70 - 99 mg/dL   BUN 12 8 - 23 mg/dL   Creatinine, Ser 9.37 0.61 - 1.24 mg/dL   Calcium  9.0 8.9 - 10.3 mg/dL   Total Protein 7.0 6.5 - 8.1 g/dL   Albumin 3.4 (L) 3.5 - 5.0 g/dL   AST 17 15 - 41 U/L   ALT 9 0 - 44 U/L   Alkaline Phosphatase 73 38 - 126 U/L   Total Bilirubin 0.2 0.0 - 1.2 mg/dL   GFR, Estimated >39 >39 mL/min   Anion gap 10 5 - 15  Hemoglobin A1c   Collection Time: 02/10/24  6:12 AM  Result Value Ref Range   Hgb A1c MFr Bld 7.5 (H) 4.8 - 5.6 %   Mean Plasma Glucose 168.55 mg/dL  Glucose, capillary   Collection Time: 02/10/24  8:18 AM  Result Value Ref Range   Glucose-Capillary 183 (H) 70 - 99 mg/dL  Glucose, capillary   Collection Time: 02/10/24 12:18 PM  Result Value Ref Range   Glucose-Capillary 153 (H) 70 - 99 mg/dL  Glucose, capillary   Collection Time: 02/10/24  5:06 PM  Result Value Ref Range   Glucose-Capillary 164 (H) 70 - 99 mg/dL  Glucose, capillary   Collection Time: 02/10/24  9:40 PM  Result Value Ref Range   Glucose-Capillary 172 (H) 70 - 99 mg/dL  Glucose, capillary   Collection Time: 02/11/24  7:42 AM  Result Value Ref Range   Glucose-Capillary 182 (H) 70 - 99 mg/dL  Basic metabolic panel   Collection Time: 02/11/24 11:05 AM  Result Value Ref Range   Sodium 137 135 - 145 mmol/L   Potassium 3.5 3.5 - 5.1 mmol/L   Chloride 101 98 - 111 mmol/L   CO2 26 22 - 32 mmol/L   Glucose, Bld 163 (H) 70 - 99 mg/dL   BUN 10 8 - 23 mg/dL   Creatinine, Ser 9.26 0.61 -  1.24 mg/dL   Calcium  8.5 (L) 8.9 - 10.3 mg/dL   GFR, Estimated >39 >39 mL/min   Anion gap 10 5 - 15  Glucose, capillary    Collection Time: 02/11/24 12:10 PM  Result Value Ref Range   Glucose-Capillary 164 (H) 70 - 99 mg/dL  Glucose, capillary   Collection Time: 02/11/24  4:46 PM  Result Value Ref Range   Glucose-Capillary 155 (H) 70 - 99 mg/dL  Glucose, capillary   Collection Time: 02/11/24  9:53 PM  Result Value Ref Range   Glucose-Capillary 159 (H) 70 - 99 mg/dL  Basic metabolic panel   Collection Time: 02/12/24  5:34 AM  Result Value Ref Range   Sodium 138 135 - 145 mmol/L   Potassium 3.5 3.5 - 5.1 mmol/L   Chloride 101 98 - 111 mmol/L   CO2 29 22 - 32 mmol/L   Glucose, Bld 139 (H) 70 - 99 mg/dL   BUN 9 8 - 23 mg/dL   Creatinine, Ser 9.25 0.61 - 1.24 mg/dL   Calcium  8.6 (L) 8.9 - 10.3 mg/dL   GFR, Estimated >39 >39 mL/min   Anion gap 8 5 - 15  Glucose, capillary   Collection Time: 02/12/24  8:21 AM  Result Value Ref Range   Glucose-Capillary 150 (H) 70 - 99 mg/dL  Glucose, capillary   Collection Time: 02/12/24 12:12 PM  Result Value Ref Range   Glucose-Capillary 137 (H) 70 - 99 mg/dL  Glucose, capillary   Collection Time: 02/12/24  4:35 PM  Result Value Ref Range   Glucose-Capillary 115 (H) 70 - 99 mg/dL  Glucose, capillary   Collection Time: 02/12/24  8:04 PM  Result Value Ref Range   Glucose-Capillary 142 (H) 70 - 99 mg/dL    Discharge Medications:   Allergies as of 02/13/2024       Reactions   Hydromorphone  Hcl Er Anaphylaxis, Other (See Comments)   Allergic to DYE in extended-release tablet, can tolerate other forms of hydromorphone    Nucynta [tapentadol] Anaphylaxis, Swelling, Other (See Comments)   Throat angioedema   Exalamide Other (See Comments)   Unknown reaction        Medication List     STOP taking these medications    oxyCODONE  5 MG/5ML solution Commonly known as: ROXICODONE        TAKE these medications    acetaminophen  500 MG tablet Commonly known as: TYLENOL  Take 500 mg by mouth 3 (three) times daily.   apixaban  5 MG Tabs tablet Commonly  known as: ELIQUIS  Take 1 tablet (5 mg total) by mouth 2 (two) times daily.   divalproex  250 MG DR tablet Commonly known as: DEPAKOTE  Take 250 mg by mouth 2 (two) times daily.   FreeStyle Libre 3 Reader [employer] Use as directed with lifestyle libre sensors   [employer] 3 Sensor Misc Place 1 sensor on the skin every 14 days. Use to check glucose continuously   furosemide  40 MG tablet Commonly known as: LASIX  Take 1 tablet (40 mg total) by mouth daily.   glipiZIDE  2.5 MG Tabs Take 2.5 mg by mouth 2 (two) times daily before a meal.   insulin  degludec 100 UNIT/ML FlexTouch Pen Commonly known as: TRESIBA  Inject 13 Units into the skin daily.   insulin  lispro 100 UNIT/ML injection Commonly known as: HUMALOG  Inject 0-12 Units into the skin See admin instructions. Administer 0-12 units before meals and at bedtime per sliding scale:       < 70 : contact NP/PA  71-200 : 0 units 201-250 : 2 units 251-300 : 4 units 301-350 : 6 units 351-400 : 8 units 401-450 : 10 units 451-600 : 12 units     > 450 : 12 units, recheck in 2 hours. If > 350 or < 100 notify provider   [name]  10 MG Tabs tablet Generic drug: empagliflozin  Take 10 mg by mouth daily before breakfast.   metoprolol  tartrate 100 MG tablet Commonly known as: LOPRESSOR  Take 2 hours prior to CT   oxyCODONE -acetaminophen  5-325 MG tablet Commonly known as: PERCOCET/ROXICET Take 1 tablet by mouth every 4 (four) hours as needed.   pantoprazole  40 MG tablet Commonly known as: PROTONIX  Take 1 tablet (40 mg total) by mouth daily.   Pen Needles 3/16 31G X 5 MM Misc Use as directed with insulin  pen   rosuvastatin  40 MG tablet Commonly known as: CRESTOR  Take 1 tablet (40 mg total) by mouth daily.   Suvorexant  5 MG Tabs Take 5 mg by mouth at bedtime.               Durable Medical Equipment  (From admission, onward)           Start     Ordered   02/13/24 0730  For home use only DME Vac  Once        Comments: Attach the wound VAC dressing to a Prevena plus portable wound VAC pump for discharge.   02/13/24 0729            Diagnostic Studies: No results found.  Disposition: Discharge disposition: [facility]       Discharge Instructions     Call MD / Call 911   Complete by: As directed    If you experience chest pain or shortness of breath, CALL 911 and be transported to the hospital emergency room.  If you develope a fever above 101 F, pus (white drainage) or increased drainage or redness at the wound, or calf pain, call your surgeon's office.   Call MD / Call 911   Complete by: As directed    If you experience chest pain or shortness of breath, CALL 911 and be transported to the hospital emergency room.  If you develope a fever above 101 F, pus (white drainage) or increased drainage or redness at the wound, or calf pain, call your surgeon's office.   Constipation Prevention   Complete by: As directed    Drink plenty of fluids.  Prune juice may be helpful.  You may use a stool softener, such as Colace (over the counter) 100 mg twice a day.  Use MiraLax  (over the counter) for constipation as needed.   Constipation Prevention   Complete by: As directed    Drink plenty of fluids.  Prune juice may be helpful.  You may use a stool softener, such as Colace (over the counter) 100 mg twice a day.  Use MiraLax  (over the counter) for constipation as needed.   Diet - low sodium heart healthy   Complete by: As directed    Diet - low sodium heart healthy   Complete by: As directed    Increase activity slowly as tolerated   Complete by: As directed    Increase activity slowly as tolerated   Complete by: As directed    Negative Pressure Wound Therapy - Incisional   Complete by: As directed    Attach vac dressing to prevena pump for discharge   Post-operative opioid taper instructions:   Complete by:  As directed    POST-OPERATIVE OPIOID TAPER INSTRUCTIONS: It is important to  wean off of your opioid medication as soon as possible. If you do not need pain medication after your surgery it is ok to stop day one. Opioids include: Codeine, Hydrocodone (Norco, Vicodin), Oxycodone (Percocet, oxycontin ) and hydromorphone  amongst others.  Long term and even short term use of opiods can cause: Increased pain response Dependence Constipation Depression Respiratory depression And more.  Withdrawal symptoms can include Flu like symptoms Nausea, vomiting And more Techniques to manage these symptoms Hydrate well Eat regular healthy meals Stay active Use relaxation techniques(deep breathing, meditating, yoga) Do Not substitute Alcohol to help with tapering If you have been on opioids for less than two weeks and do not have pain than it is ok to stop all together.  Plan to wean off of opioids This plan should start within one week post op of your joint replacement. Maintain the same interval or time between taking each dose and first decrease the dose.  Cut the total daily intake of opioids by one tablet each day Next start to increase the time between doses. The last dose that should be eliminated is the evening dose.      Post-operative opioid taper instructions:   Complete by: As directed    POST-OPERATIVE OPIOID TAPER INSTRUCTIONS: It is important to wean off of your opioid medication as soon as possible. If you do not need pain medication after your surgery it is ok to stop day one. Opioids include: Codeine, Hydrocodone (Norco, Vicodin), Oxycodone (Percocet, oxycontin ) and hydromorphone  amongst others.  Long term and even short term use of opiods can cause: Increased pain response Dependence Constipation Depression Respiratory depression And more.  Withdrawal symptoms can include Flu like symptoms Nausea, vomiting And more Techniques to manage these symptoms Hydrate well Eat regular healthy meals Stay active Use relaxation techniques(deep breathing,  meditating, yoga) Do Not substitute Alcohol to help with tapering If you have been on opioids for less than two weeks and do not have pain than it is ok to stop all together.  Plan to wean off of opioids This plan should start within one week post op of your joint replacement. Maintain the same interval or time between taking each dose and first decrease the dose.  Cut the total daily intake of opioids by one tablet each day Next start to increase the time between doses. The last dose that should be eliminated is the evening dose.           Follow-up Information     Harden Jerona GAILS, MD Follow up in 1 week(s).   Specialty: Orthopedic Surgery Contact information: [address] [place]  [place] [address] [phone]                  Signed: Jerona GAILS Harden 02/13/2024, 7:29 AM

## 2024-02-13 NOTE — Progress Notes (Signed)
 Pt refused colace. MD notified.   Amado GORMAN Arabia, RN

## 2024-02-13 NOTE — Plan of Care (Signed)
 Problem: Education: Goal: Knowledge of General Education information will improve Description: Including pain rating scale, medication(s)/side effects and non-pharmacologic comfort measures 02/13/2024 2305 by Lennie Rodgers BIRCH, RN Outcome: Progressing 02/13/2024 2159 by Lennie Rodgers BIRCH, RN Outcome: Progressing   Problem: Health Behavior/Discharge Planning: Goal: Ability to manage health-related needs will improve 02/13/2024 2305 by Lennie Rodgers BIRCH, RN Outcome: Progressing 02/13/2024 2159 by Lennie Rodgers BIRCH, RN Outcome: Progressing   Problem: Clinical Measurements: Goal: Ability to maintain clinical measurements within normal limits will improve 02/13/2024 2305 by Lennie Rodgers BIRCH, RN Outcome: Progressing 02/13/2024 2159 by Lennie Rodgers BIRCH, RN Outcome: Progressing Goal: Will remain free from infection 02/13/2024 2305 by Lennie Rodgers BIRCH, RN Outcome: Progressing 02/13/2024 2159 by Lennie Rodgers BIRCH, RN Outcome: Progressing Goal: Diagnostic test results will improve 02/13/2024 2305 by Lennie Rodgers BIRCH, RN Outcome: Progressing 02/13/2024 2159 by Lennie Rodgers BIRCH, RN Outcome: Progressing Goal: Respiratory complications will improve 02/13/2024 2305 by Lennie Rodgers BIRCH, RN Outcome: Progressing 02/13/2024 2159 by Lennie Rodgers BIRCH, RN Outcome: Progressing Goal: Cardiovascular complication will be avoided 02/13/2024 2305 by Lennie Rodgers BIRCH, RN Outcome: Progressing 02/13/2024 2159 by Lennie Rodgers BIRCH, RN Outcome: Progressing   Problem: Activity: Goal: Risk for activity intolerance will decrease 02/13/2024 2305 by Lennie Rodgers BIRCH, RN Outcome: Progressing 02/13/2024 2159 by Lennie Rodgers BIRCH, RN Outcome: Progressing   Problem: Nutrition: Goal: Adequate nutrition will be maintained 02/13/2024 2305 by Lennie Rodgers BIRCH, RN Outcome: Progressing 02/13/2024 2159 by Lennie Rodgers BIRCH, RN Outcome: Progressing   Problem: Coping: Goal: Level of anxiety will decrease 02/13/2024 2305 by Lennie Rodgers BIRCH, RN Outcome: Progressing 02/13/2024 2159 by Lennie Rodgers BIRCH, RN Outcome: Progressing   Problem: Elimination: Goal: Will not experience complications related to bowel motility 02/13/2024 2305 by Lennie Rodgers BIRCH, RN Outcome: Progressing 02/13/2024 2159 by Lennie Rodgers BIRCH, RN Outcome: Progressing Goal: Will not experience complications related to urinary retention 02/13/2024 2305 by Lennie Rodgers BIRCH, RN Outcome: Progressing 02/13/2024 2159 by Lennie Rodgers BIRCH, RN Outcome: Progressing   Problem: Pain Managment: Goal: General experience of comfort will improve and/or be controlled 02/13/2024 2305 by Lennie Rodgers BIRCH, RN Outcome: Progressing 02/13/2024 2159 by Lennie Rodgers BIRCH, RN Outcome: Progressing   Problem: Safety: Goal: Ability to remain free from injury will improve 02/13/2024 2305 by Lennie Rodgers BIRCH, RN Outcome: Progressing 02/13/2024 2159 by Lennie Rodgers BIRCH, RN Outcome: Progressing   Problem: Skin Integrity: Goal: Risk for impaired skin integrity will decrease 02/13/2024 2305 by Lennie Rodgers BIRCH, RN Outcome: Progressing 02/13/2024 2159 by Lennie Rodgers BIRCH, RN Outcome: Progressing   Problem: Education: Goal: Ability to describe self-care measures that may prevent or decrease complications (Diabetes Survival Skills Education) will improve 02/13/2024 2305 by Lennie Rodgers BIRCH, RN Outcome: Progressing 02/13/2024 2159 by Lennie Rodgers BIRCH, RN Outcome: Progressing Goal: Individualized Educational Video(s) 02/13/2024 2305 by Lennie Rodgers BIRCH, RN Outcome: Progressing 02/13/2024 2159 by Lennie Rodgers BIRCH, RN Outcome: Progressing   Problem: Coping: Goal: Ability to adjust to condition or change in health will improve 02/13/2024 2305 by Lennie Rodgers BIRCH, RN Outcome: Progressing 02/13/2024 2159 by Lennie Rodgers BIRCH, RN Outcome: Progressing   Problem: Fluid Volume: Goal: Ability to maintain a balanced intake and output will improve 02/13/2024 2305 by Lennie Rodgers BIRCH,  RN Outcome: Progressing 02/13/2024 2159 by Lennie Rodgers BIRCH, RN Outcome: Progressing   Problem: Health Behavior/Discharge Planning: Goal: Ability to identify and utilize available resources and services will improve 02/13/2024 2305 by Lennie Rodgers BIRCH, RN Outcome: Progressing 02/13/2024 2159 by  Lennie Rodgers BIRCH, RN Outcome: Progressing Goal: Ability to manage health-related needs will improve 02/13/2024 2305 by Lennie Rodgers BIRCH, RN Outcome: Progressing 02/13/2024 2159 by Lennie Rodgers BIRCH, RN Outcome: Progressing   Problem: Metabolic: Goal: Ability to maintain appropriate glucose levels will improve 02/13/2024 2305 by Lennie Rodgers BIRCH, RN Outcome: Progressing 02/13/2024 2159 by Lennie Rodgers BIRCH, RN Outcome: Progressing   Problem: Nutritional: Goal: Maintenance of adequate nutrition will improve 02/13/2024 2305 by Lennie Rodgers BIRCH, RN Outcome: Progressing 02/13/2024 2159 by Lennie Rodgers BIRCH, RN Outcome: Progressing Goal: Progress toward achieving an optimal weight will improve 02/13/2024 2305 by Lennie Rodgers BIRCH, RN Outcome: Progressing 02/13/2024 2159 by Lennie Rodgers BIRCH, RN Outcome: Progressing   Problem: Skin Integrity: Goal: Risk for impaired skin integrity will decrease 02/13/2024 2305 by Lennie Rodgers BIRCH, RN Outcome: Progressing 02/13/2024 2159 by Lennie Rodgers BIRCH, RN Outcome: Progressing   Problem: Tissue Perfusion: Goal: Adequacy of tissue perfusion will improve 02/13/2024 2305 by Lennie Rodgers BIRCH, RN Outcome: Progressing 02/13/2024 2159 by Lennie Rodgers BIRCH, RN Outcome: Progressing   Problem: Education: Goal: Knowledge of the prescribed therapeutic regimen will improve 02/13/2024 2305 by Lennie Rodgers BIRCH, RN Outcome: Progressing 02/13/2024 2159 by Lennie Rodgers BIRCH, RN Outcome: Progressing Goal: Ability to verbalize activity precautions or restrictions will improve 02/13/2024 2305 by Lennie Rodgers BIRCH, RN Outcome: Progressing 02/13/2024 2159 by Lennie Rodgers BIRCH,  RN Outcome: Progressing Goal: Understanding of discharge needs will improve 02/13/2024 2305 by Lennie Rodgers BIRCH, RN Outcome: Progressing 02/13/2024 2159 by Lennie Rodgers BIRCH, RN Outcome: Progressing   Problem: Activity: Goal: Ability to perform//tolerate increased activity and mobilize with assistive devices will improve 02/13/2024 2305 by Lennie Rodgers BIRCH, RN Outcome: Progressing 02/13/2024 2159 by Lennie Rodgers BIRCH, RN Outcome: Progressing   Problem: Clinical Measurements: Goal: Postoperative complications will be avoided or minimized 02/13/2024 2305 by Lennie Rodgers BIRCH, RN Outcome: Progressing 02/13/2024 2159 by Lennie Rodgers BIRCH, RN Outcome: Progressing   Problem: Self-Care: Goal: Ability to meet self-care needs will improve 02/13/2024 2305 by Lennie Rodgers BIRCH, RN Outcome: Progressing 02/13/2024 2159 by Lennie Rodgers BIRCH, RN Outcome: Progressing   Problem: Self-Concept: Goal: Ability to maintain and perform role responsibilities to the fullest extent possible will improve 02/13/2024 2305 by Lennie Rodgers BIRCH, RN Outcome: Progressing 02/13/2024 2159 by Lennie Rodgers BIRCH, RN Outcome: Progressing   Problem: Pain Management: Goal: Pain level will decrease with appropriate interventions 02/13/2024 2305 by Lennie Rodgers BIRCH, RN Outcome: Progressing 02/13/2024 2159 by Lennie Rodgers BIRCH, RN Outcome: Progressing   Problem: Skin Integrity: Goal: Demonstration of wound healing without infection will improve 02/13/2024 2305 by Lennie Rodgers BIRCH, RN Outcome: Progressing 02/13/2024 2159 by Lennie Rodgers BIRCH, RN Outcome: Progressing

## 2024-02-14 LAB — GLUCOSE, CAPILLARY
Glucose-Capillary: 161 mg/dL — ABNORMAL HIGH (ref 70–99)
Glucose-Capillary: 112 mg/dL — ABNORMAL HIGH (ref 70–99)

## 2024-02-14 NOTE — Progress Notes (Signed)
 Assisted patient to get dressed. Packed all patient's belongings into a bag. Transported by [facility] to [facility]. Called report to [place] at [facility] [phone].

## 2024-02-14 NOTE — Progress Notes (Signed)
 Patient ID: Troy [name], male   DOB: 12/27/1960, 63 y.o.   MRN: [id number] Patient is status post revision transtibial amputation.  There has been no new drainage in the wound VAC canister for 3 days.  Plan for discharge with the Prevena plus portable wound VAC pump.  Patient to return back to his skilled nursing facility.

## 2024-02-14 NOTE — TOC Transition Note (Signed)
 Transition of Care [facility]) - Discharge Note   Patient Details  Name: Troy [name] MRN: [id number] Date of Birth: 05-18-1961  Transition of Care [facility]) CM/SW Contact:  Jeoffrey LITTIE [name] Phone Number: 02/14/2024, 11:27 AM   Clinical Narrative:    Patient will DC to: [facility]  Anticipated DC date: 02/14/24 Family notified: Yes Transport by: [facility]   Per MD patient ready for DC to [facility]. RN to call report prior to discharge [phone] Room 802A. RN, patient, patient's family, and facility notified of DC. Discharge Summary and FL2 sent to facility. DC packet on chart. Ambulance transport requested for patient.   CSW will sign off for now as social work intervention is no longer needed. Please consult us  again if new needs arise.     Final next level of care: Skilled Nursing Facility Barriers to Discharge: Continued Medical Work up   Patient Goals and CMS Choice Patient states their goals for this hospitalization and ongoing recovery are:: Return to [facility]          Discharge Placement                       Discharge Plan and Services Additional resources added to the After Visit Summary for                                       Social Drivers of Health (SDOH) Interventions SDOH Screenings   Food Insecurity: No Food Insecurity (02/10/2024)  Housing: Low Risk  (02/10/2024)  Transportation Needs: No Transportation Needs (02/10/2024)  Utilities: Not At Risk (02/10/2024)  Financial Resource Strain: Low Risk  (04/09/2021)   Received from [facility]  Physical Activity: Insufficiently Active (04/09/2021)   Received from [facility]  Social Connections: Unknown (10/22/2021)   Received from [facility]  Stress: No Stress Concern Present (04/09/2021)   Received from [facility]  Tobacco Use: Medium Risk (02/10/2024)     Readmission Risk Interventions    08/25/2021   12:46 PM  Readmission Risk Prevention Plan  Transportation Screening Complete   Medication Review (RN Care Manager) Complete  PCP or Specialist appointment within 3-5 days of discharge Complete  HRI or Home Care Consult Complete  SW Recovery Care/Counseling Consult Complete  Palliative Care Screening Not Applicable  Skilled Nursing Facility Complete

## 2024-02-14 NOTE — Progress Notes (Signed)
 Changed hospital wound VAC to prevena plus wound vac for home use.  Removed PIV, site was clean, dry, and, and intact.

## 2024-02-14 NOTE — NC FL2 (Signed)
 [place]  MEDICAID FL2 LEVEL OF CARE FORM     IDENTIFICATION  Patient Name: Troy [name] Birthdate: 1960-08-24 Sex: male Admission Date (Current Location): 02/10/2024  [facility] and [place] Number:  Producer, television/film/video and Address:  [facility], [address], [place], [place] [address]      Provider Number: [id number]  Attending Physician Name and Address:  Harden Jerona GAILS, MD  Relative Name and Phone Number:       Current Level of Care: Hospital Recommended Level of Care: Skilled Nursing Facility Prior Approval Number:    Date Approved/Denied:   PASRR Number: [id number]  Discharge Plan: SNF    Current Diagnoses: Patient Active Problem List   Diagnosis Date Noted   Non-healing wound of amputation stump (HCC) 02/10/2024   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   Ventricular aneurysm 04/08/2023   (HFpEF) heart failure with preserved ejection fraction (HCC) 04/02/2023   Lactic acidosis 04/02/2023   Acute respiratory failure with hypoxia (HCC) 04/02/2023   Leukocytosis 04/02/2023   Uncontrolled type 2 diabetes mellitus with hyperglycemia, with long-term current use of insulin  (HCC) 04/02/2023   Hypochromic anemia 04/02/2023   PVD (peripheral vascular disease) (HCC) 04/02/2023   Thrombocytosis 04/02/2023   Respiratory distress 03/21/2023   Osteomyelitis of ankle or foot, acute, right (HCC) 03/11/2023   Abscess of right lower leg 03/11/2023   Below-knee amputation of right lower extremity (HCC) 03/11/2023   Chronic respiratory failure with hypoxia (HCC) 02/12/2023   Pulmonary infiltrates 02/12/2023   Acute on chronic heart failure with preserved ejection fraction (HFpEF) (HCC) 02/12/2023   Chronic osteomyelitis of right foot with draining sinus (HCC) 02/07/2023   Paroxysmal atrial flutter (HCC) 02/05/2023   Hypotension 02/05/2023   Atrial fibrillation with RVR (HCC) 02/04/2023   Atrial flutter (HCC) 02/04/2023   Lower limb ischemia: ??? 08/22/2021    Diabetic acidosis without coma (HCC)    Hypokalemia    Hypomagnesemia    Hyponatremia    Multifocal pneumonia    Parapneumonic effusion 08/18/2021   Bowel Ileus (HCC) 08/18/2021   ?? 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   Ketoacidosis due to type 2 diabetes mellitus (HCC) 08/15/2021   COVID-19 virus infection 08/15/2021   S/P AKA (above knee amputation), left (HCC) 02/12/2021   Amputation of right great toe and 2nd Toe 02/12/2021   Sepsis due to left-sided neck cellulitis/MRSA 02/12/2021   Hypotensive episode    Elevated troponin    SIRS (systemic inflammatory response syndrome) (HCC) 02/11/2021   Abscess of bursa of left ankle 01/21/2021   Dehiscence of amputation stump (HCC)    Ischemia of left BKA site (HCC)    Abscess of leg without foot, left    Osteomyelitis of second toe of right foot (HCC) 07/12/2019   Below knee amputation status, left 01/25/2018   Dehiscence of amputation stump of right lower extremity (HCC)    Status post left foot surgery 12/15/2017   GERD (gastroesophageal reflux disease) 09/26/2017   Hyperlipidemia associated with type 2 diabetes mellitus (HCC) 07/08/2017   Snoring 06/07/2016   Chest tube in place 01/22/2016   Abnormal nuclear stress test 01/22/2016   Pain, chronic due to trauma 07/04/2012   Complex regional pain syndrome I of lower limb 07/04/2012   COPD (chronic obstructive pulmonary disease) (HCC) 01/27/2011   Pre-operative cardiovascular examination 01/27/2011   Nonspecific abnormal electrocardiogram (ECG) (EKG) 01/27/2011   Murmur 01/27/2011   DM2 (diabetes mellitus, type 2) (HCC)    HTN (hypertension)  HLD (hyperlipidemia)    Tobacco abuse     Orientation RESPIRATION BLADDER Height & Weight     Self, Time, Situation, Place  Normal Continent Weight: 175 lb (79.4 kg) Height:  6' (182.9 cm)  BEHAVIORAL SYMPTOMS/MOOD NEUROLOGICAL BOWEL NUTRITION STATUS      Continent Diet (See DC Summary)  AMBULATORY STATUS COMMUNICATION OF NEEDS  Skin   Limited Assist Verbally Other (Comment) (wound on right knee)                       Personal Care Assistance Level of Assistance  Bathing, Dressing, Feeding Bathing Assistance: Limited assistance Feeding assistance: Limited assistance Dressing Assistance: Limited assistance     Functional Limitations Info  Sight, Hearing, Speech Sight Info: Impaired Hearing Info: Adequate Speech Info: Adequate    SPECIAL CARE FACTORS FREQUENCY  PT (By licensed PT), OT (By licensed OT)     PT Frequency: 5x/week OT Frequency: 5x/week            Contractures Contractures Info: Not present    Additional Factors Info  Code Status, Allergies Code Status Info: Full Allergies Info: Hydromorphone  Hcl Er; Nucynta (Tapentadol); Exalamide           Current Medications (02/14/2024):  This is the current hospital active medication list Current Facility-Administered Medications  Medication Dose Route Frequency Provider Last Rate Last Admin   acetaminophen  (TYLENOL ) tablet 500 mg  500 mg Oral TID Gerome Maurilio HERO, PA-C   500 mg at 02/14/24 1040   divalproex  (DEPAKOTE ) DR tablet 250 mg  250 mg Oral BID Gerome Maurilio M, PA-C   250 mg at 02/14/24 1040   docusate sodium  (COLACE) capsule 100 mg  100 mg Oral Daily Gerome Maurilio M, PA-C   100 mg at 02/14/24 1040   empagliflozin  ([name] ) tablet 10 mg  10 mg Oral QAC breakfast Gerome Maurilio M, PA-C   10 mg at 02/14/24 0900   furosemide  (LASIX ) tablet 40 mg  40 mg Oral Daily Gerome Maurilio M, PA-C   40 mg at 02/14/24 1040   glipiZIDE  (GLUCOTROL ) tablet 2.5 mg  2.5 mg Oral BID AC CollinsMaurilio M, PA-C   2.5 mg at 02/14/24 0900   hydrALAZINE  (APRESOLINE ) injection 5 mg  5 mg Intravenous Q20 Min PRN Gerome Maurilio M, PA-C       insulin  aspart (novoLOG ) injection 0-15 Units  0-15 Units Subcutaneous TID WC Gerome Maurilio HERO, PA-C   3 Units at 02/14/24 0830   insulin  glargine (LANTUS ) injection 13 Units  13 Units Subcutaneous Daily Reome, Earle J, RPH   13  Units at 02/14/24 1041   labetalol  (NORMODYNE ) injection 10 mg  10 mg Intravenous Q10 min PRN Gerome Maurilio M, PA-C       metoprolol  tartrate (LOPRESSOR ) injection 2-5 mg  2-5 mg Intravenous Q2H PRN Gerome Maurilio M, PA-C       morphine  (PF) 2 MG/ML injection 2 mg  2 mg Intravenous Q2H PRN Gerome Maurilio M, PA-C       ondansetron  (ZOFRAN ) injection 4 mg  4 mg Intravenous Q6H PRN Gerome Maurilio M, PA-C   4 mg at 02/11/24 1949   oxyCODONE  (Oxy IR/ROXICODONE ) immediate release tablet 5-10 mg  5-10 mg Oral Q4H PRN Duda, Marcus V, MD   10 mg at 02/12/24 1755   pantoprazole  (PROTONIX ) EC tablet 40 mg  40 mg Oral Daily Gerome Maurilio M, PA-C   40 mg at 02/14/24 1040   phenol (CHLORASEPTIC) mouth spray 1  spray  1 spray Mouth/Throat PRN Gerome Maurilio HERO, PA-C       potassium chloride  SA (KLOR-CON  M) CR tablet 40-60 mEq  40-60 mEq Oral Daily PRN Gerome Maurilio HERO, PA-C   40 mEq at 02/12/24 1048   rosuvastatin  (CRESTOR ) tablet 40 mg  40 mg Oral Daily Gerome Maurilio M, PA-C   40 mg at 02/14/24 1040     Discharge Medications: Please see discharge summary for a list of discharge medications.  Relevant Imaging Results:  Relevant Lab Results:   Additional Information SSN: [id number]  Jeoffrey LITTIE Moose, LCSWA

## 2024-02-14 NOTE — Plan of Care (Signed)
 Problem: Education: Goal: Knowledge of General Education information will improve Description: Including pain rating scale, medication(s)/side effects and non-pharmacologic comfort measures 02/14/2024 1434 by Mattisyn Cardona K, RN Outcome: Adequate for Discharge 02/14/2024 1434 by Abriel Geesey K, RN Outcome: Adequate for Discharge   Problem: Health Behavior/Discharge Planning: Goal: Ability to manage health-related needs will improve 02/14/2024 1434 by Mia Winthrop K, RN Outcome: Adequate for Discharge 02/14/2024 1434 by Ford Peddie K, RN Outcome: Adequate for Discharge   Problem: Clinical Measurements: Goal: Ability to maintain clinical measurements within normal limits will improve 02/14/2024 1434 by Chidi Shirer K, RN Outcome: Adequate for Discharge 02/14/2024 1434 by Jonte Wollam K, RN Outcome: Adequate for Discharge Goal: Will remain free from infection 02/14/2024 1434 by Saige Busby K, RN Outcome: Adequate for Discharge 02/14/2024 1434 by Pilar Westergaard K, RN Outcome: Adequate for Discharge Goal: Diagnostic test results will improve 02/14/2024 1434 by Eun Vermeer K, RN Outcome: Adequate for Discharge 02/14/2024 1434 by Castle Lamons K, RN Outcome: Adequate for Discharge Goal: Respiratory complications will improve 02/14/2024 1434 by Coralynn Gaona K, RN Outcome: Adequate for Discharge 02/14/2024 1434 by Tillman Kazmierski K, RN Outcome: Adequate for Discharge Goal: Cardiovascular complication will be avoided 02/14/2024 1434 by Navpreet Szczygiel K, RN Outcome: Adequate for Discharge 02/14/2024 1434 by Icy Fuhrmann K, RN Outcome: Adequate for Discharge   Problem: Activity: Goal: Risk for activity intolerance will decrease 02/14/2024 1434 by Kassius Battiste K, RN Outcome: Adequate for Discharge 02/14/2024 1434 by Florian Dellis POUR, RN Outcome: Adequate for Discharge   Problem: Nutrition: Goal: Adequate nutrition will be maintained 02/14/2024 1434 by Mozes Sagar K,  RN Outcome: Adequate for Discharge 02/14/2024 1434 by Florian Dellis POUR, RN Outcome: Adequate for Discharge   Problem: Coping: Goal: Level of anxiety will decrease 02/14/2024 1434 by Breyana Follansbee K, RN Outcome: Adequate for Discharge 02/14/2024 1434 by Florian Dellis POUR, RN Outcome: Adequate for Discharge   Problem: Elimination: Goal: Will not experience complications related to bowel motility 02/14/2024 1434 by Jaimarie Rapozo K, RN Outcome: Adequate for Discharge 02/14/2024 1434 by Nicholle Falzon K, RN Outcome: Adequate for Discharge Goal: Will not experience complications related to urinary retention 02/14/2024 1434 by Roe Wilner K, RN Outcome: Adequate for Discharge 02/14/2024 1434 by Florian Dellis POUR, RN Outcome: Adequate for Discharge   Problem: Pain Managment: Goal: General experience of comfort will improve and/or be controlled 02/14/2024 1434 by Kalmen Lollar K, RN Outcome: Adequate for Discharge 02/14/2024 1434 by Florian Dellis POUR, RN Outcome: Adequate for Discharge   Problem: Safety: Goal: Ability to remain free from injury will improve 02/14/2024 1434 by Analeia Ismael K, RN Outcome: Adequate for Discharge 02/14/2024 1434 by Melda Mermelstein K, RN Outcome: Adequate for Discharge   Problem: Skin Integrity: Goal: Risk for impaired skin integrity will decrease 02/14/2024 1434 by Hollister Wessler K, RN Outcome: Adequate for Discharge 02/14/2024 1434 by Sofiah Lyne K, RN Outcome: Adequate for Discharge   Problem: Education: Goal: Ability to describe self-care measures that may prevent or decrease complications (Diabetes Survival Skills Education) will improve 02/14/2024 1434 by Kylon Philbrook K, RN Outcome: Adequate for Discharge 02/14/2024 1434 by Darci Lykins K, RN Outcome: Adequate for Discharge Goal: Individualized Educational Video(s) 02/14/2024 1434 by Philippe Gang K, RN Outcome: Adequate for Discharge 02/14/2024 1434 by Faithanne Verret K, RN Outcome: Adequate  for Discharge   Problem: Coping: Goal: Ability to adjust to condition or change in health will improve 02/14/2024 1434 by Yanilen Adamik K, RN Outcome: Adequate for Discharge 02/14/2024 1434 by  Ondre Salvetti K, RN Outcome: Adequate for Discharge   Problem: Fluid Volume: Goal: Ability to maintain a balanced intake and output will improve 02/14/2024 1434 by Andriea Hasegawa K, RN Outcome: Adequate for Discharge 02/14/2024 1434 by Kaylena Pacifico K, RN Outcome: Adequate for Discharge   Problem: Health Behavior/Discharge Planning: Goal: Ability to identify and utilize available resources and services will improve 02/14/2024 1434 by Marian Meneely K, RN Outcome: Adequate for Discharge 02/14/2024 1434 by Davin Archuletta K, RN Outcome: Adequate for Discharge Goal: Ability to manage health-related needs will improve 02/14/2024 1434 by Keyera Hattabaugh K, RN Outcome: Adequate for Discharge 02/14/2024 1434 by Sera Hitsman K, RN Outcome: Adequate for Discharge   Problem: Metabolic: Goal: Ability to maintain appropriate glucose levels will improve 02/14/2024 1434 by Everitt Wenner K, RN Outcome: Adequate for Discharge 02/14/2024 1434 by Clarnce Homan K, RN Outcome: Adequate for Discharge   Problem: Nutritional: Goal: Maintenance of adequate nutrition will improve 02/14/2024 1434 by Leng Montesdeoca K, RN Outcome: Adequate for Discharge 02/14/2024 1434 by Kaidyn Hernandes K, RN Outcome: Adequate for Discharge Goal: Progress toward achieving an optimal weight will improve 02/14/2024 1434 by Saul Fabiano K, RN Outcome: Adequate for Discharge 02/14/2024 1434 by Florian Dellis POUR, RN Outcome: Adequate for Discharge   Problem: Skin Integrity: Goal: Risk for impaired skin integrity will decrease 02/14/2024 1434 by Karthikeya Funke K, RN Outcome: Adequate for Discharge 02/14/2024 1434 by Tiran Sauseda K, RN Outcome: Adequate for Discharge   Problem: Tissue Perfusion: Goal: Adequacy of tissue perfusion  will improve 02/14/2024 1434 by Xitlally Mooneyham K, RN Outcome: Adequate for Discharge 02/14/2024 1434 by Mekhi Lascola K, RN Outcome: Adequate for Discharge   Problem: Education: Goal: Knowledge of the prescribed therapeutic regimen will improve 02/14/2024 1434 by Layce Sprung K, RN Outcome: Adequate for Discharge 02/14/2024 1434 by Thaniel Coluccio K, RN Outcome: Adequate for Discharge Goal: Ability to verbalize activity precautions or restrictions will improve 02/14/2024 1434 by Dannica Bickham K, RN Outcome: Adequate for Discharge 02/14/2024 1434 by Harutyun Monteverde K, RN Outcome: Adequate for Discharge Goal: Understanding of discharge needs will improve 02/14/2024 1434 by Zaineb Nowaczyk K, RN Outcome: Adequate for Discharge 02/14/2024 1434 by Brennen Gardiner K, RN Outcome: Adequate for Discharge   Problem: Activity: Goal: Ability to perform//tolerate increased activity and mobilize with assistive devices will improve 02/14/2024 1434 by Brenyn Petrey K, RN Outcome: Adequate for Discharge 02/14/2024 1434 by Dashanna Kinnamon K, RN Outcome: Adequate for Discharge   Problem: Clinical Measurements: Goal: Postoperative complications will be avoided or minimized 02/14/2024 1434 by Chloee Tena K, RN Outcome: Adequate for Discharge 02/14/2024 1434 by Devlon Dosher K, RN Outcome: Adequate for Discharge   Problem: Self-Care: Goal: Ability to meet self-care needs will improve 02/14/2024 1434 by Latrelle Fuston K, RN Outcome: Adequate for Discharge 02/14/2024 1434 by Toy Eisemann K, RN Outcome: Adequate for Discharge   Problem: Self-Concept: Goal: Ability to maintain and perform role responsibilities to the fullest extent possible will improve 02/14/2024 1434 by Braheem Tomasik K, RN Outcome: Adequate for Discharge 02/14/2024 1434 by Korie Brabson K, RN Outcome: Adequate for Discharge   Problem: Pain Management: Goal: Pain level will decrease with appropriate interventions 02/14/2024 1434  by Shadavia Dampier K, RN Outcome: Adequate for Discharge 02/14/2024 1434 by Renn Stille K, RN Outcome: Adequate for Discharge   Problem: Skin Integrity: Goal: Demonstration of wound healing without infection will improve 02/14/2024 1434 by Miosotis Wetsel K, RN Outcome: Adequate for Discharge 02/14/2024 1434 by Alva Broxson K, RN Outcome: Adequate  for Discharge

## 2024-02-14 NOTE — Discharge Summary (Signed)
 Physician Discharge Summary  Patient ID: Troy [name] MRN: [id number] DOB/AGE: 26-Sep-1960 63 y.o.  Admit date: 02/10/2024 Discharge date: 02/14/2024  Admission Diagnoses:  Principal Problem:   Non-healing wound of amputation stump (HCC) Active Problems:   Dehiscence of amputation stump of right lower extremity [facility])   Discharge Diagnoses:  Same  Past Medical History:  Diagnosis Date   Acute renal failure (HCC)    in setting of NSAID use and orthopedic surgery 2010   Anxiety and depression    Chronic diastolic CHF (congestive heart failure) (HCC)    a. Echo 6/17: severe conc LVH, vigorous EF, EF 65-70%, no dynamic obstruction, no RWMA, Gr 1 DD, mild TR  //  b. 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 LHC 2002: irregs  //  b. LHC in 8/17: no sig CAD, apical DK, hyperdynamic LV, LVEDP 28   History of kidney stones    History of nuclear stress test    a. Nuc 7/17: Overall, intermediate risk nuclear stress test secondary to small size of apical lateral defect and reduced ejection fraction.  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    Kidney calculi    Palpitations    Pneumonia    PTSD (post-traumatic stress disorder)    Tobacco abuse    Transient ischemic attack     Surgeries: Procedure(s): REVISION AMPUTATION, BELOW THE KNEE APPLICATION, WOUND VAC on 02/10/2024   Consultants:   Discharged Condition: Improved  Hospital Course: Troy [name] is an 63 y.o. male who was admitted 02/10/2024 with a chief complaint of No  chief complaint on file. , and found to have a diagnosis of Non-healing wound of amputation stump (HCC).  They were brought to the operating room on 02/10/2024 and underwent the above named procedures.    They were given perioperative antibiotics:  Anti-infectives (From admission, onward)    Start     Dose/Rate Route Frequency Ordered Stop   02/10/24 1530  ceFAZolin  (ANCEF ) IVPB 2g/100 mL premix        2 g 200 mL/hr over 30 Minutes Intravenous Every 8 hours 02/10/24 1054 02/10/24 2344   02/10/24 0600  ceFAZolin  (ANCEF ) IVPB 2g/100 mL premix        2 g 200 mL/hr over 30 Minutes Intravenous On call to O.R. 02/10/24 [id number] 02/10/24 0735     .  They were given compression stockings, early ambulation, and chemoprophylaxis for DVT prophylaxis.  They benefited maximally from their hospital stay and there were no complications.    Recent vital signs:  Vitals:   02/13/24 1944 02/14/24 0407  BP: 106/60 111/64  Pulse: 93 85  Resp:    Temp: 98.5 F (36.9 C) 98.3 F (36.8 C)  SpO2: 98% 100%    Recent laboratory studies:  Results for orders placed or performed during the hospital encounter of 02/10/24  Glucose, capillary  Collection Time: 02/10/24  6:00 AM  Result Value Ref Range   Glucose-Capillary 211 (H) 70 - 99 mg/dL  CBC WITH DIFFERENTIAL   Collection Time: 02/10/24  6:12 AM  Result Value Ref Range   WBC 13.1 (H) 4.0 - 10.5 K/uL   RBC 5.23 4.22 - 5.81 MIL/uL   Hemoglobin 11.5 (L) 13.0 - 17.0 g/dL   HCT 61.1 (L) 60.9 - 47.9 %   MCV 74.2 (L) 80.0 - 100.0 fL   MCH 22.0 (L) 26.0 - 34.0 pg   MCHC 29.6 (L) 30.0 - 36.0 g/dL   RDW 82.7 (H) 88.4 - 84.4 %   Platelets 255 150 - 400 K/uL   nRBC 0.0 0.0 - 0.2 %   Neutrophils Relative % 64 %   Neutro Abs 8.6 (H) 1.7 - 7.7 K/uL   Lymphocytes Relative 23 %   Lymphs Abs 3.0 0.7 - 4.0 K/uL   Monocytes Relative 8 %   Monocytes Absolute 1.0 0.1 - 1.0 K/uL   Eosinophils Relative 3 %   Eosinophils Absolute 0.4 0.0 - 0.5 K/uL   Basophils  Relative 1 %   Basophils Absolute 0.1 0.0 - 0.1 K/uL   Immature Granulocytes 1 %   Abs Immature Granulocytes 0.06 0.00 - 0.07 K/uL  Comprehensive metabolic panel   Collection Time: 02/10/24  6:12 AM  Result Value Ref Range   Sodium 140 135 - 145 mmol/L   Potassium 3.6 3.5 - 5.1 mmol/L   Chloride 107 98 - 111 mmol/L   CO2 23 22 - 32 mmol/L   Glucose, Bld 209 (H) 70 - 99 mg/dL   BUN 12 8 - 23 mg/dL   Creatinine, Ser 9.37 0.61 - 1.24 mg/dL   Calcium  9.0 8.9 - 10.3 mg/dL   Total Protein 7.0 6.5 - 8.1 g/dL   Albumin 3.4 (L) 3.5 - 5.0 g/dL   AST 17 15 - 41 U/L   ALT 9 0 - 44 U/L   Alkaline Phosphatase 73 38 - 126 U/L   Total Bilirubin 0.2 0.0 - 1.2 mg/dL   GFR, Estimated >39 >39 mL/min   Anion gap 10 5 - 15  Hemoglobin A1c   Collection Time: 02/10/24  6:12 AM  Result Value Ref Range   Hgb A1c MFr Bld 7.5 (H) 4.8 - 5.6 %   Mean Plasma Glucose 168.55 mg/dL  Glucose, capillary   Collection Time: 02/10/24  8:18 AM  Result Value Ref Range   Glucose-Capillary 183 (H) 70 - 99 mg/dL  Glucose, capillary   Collection Time: 02/10/24 12:18 PM  Result Value Ref Range   Glucose-Capillary 153 (H) 70 - 99 mg/dL  Glucose, capillary   Collection Time: 02/10/24  5:06 PM  Result Value Ref Range   Glucose-Capillary 164 (H) 70 - 99 mg/dL  Glucose, capillary   Collection Time: 02/10/24  9:40 PM  Result Value Ref Range   Glucose-Capillary 172 (H) 70 - 99 mg/dL  Glucose, capillary   Collection Time: 02/11/24  7:42 AM  Result Value Ref Range   Glucose-Capillary 182 (H) 70 - 99 mg/dL  Basic metabolic panel   Collection Time: 02/11/24 11:05 AM  Result Value Ref Range   Sodium 137 135 - 145 mmol/L   Potassium 3.5 3.5 - 5.1 mmol/L   Chloride 101 98 - 111 mmol/L   CO2 26 22 - 32 mmol/L   Glucose, Bld 163 (H) 70 - 99 mg/dL   BUN 10 8 - 23 mg/dL   Creatinine, Ser 9.26 0.61 -  1.24 mg/dL   Calcium  8.5 (L) 8.9 - 10.3 mg/dL   GFR, Estimated >39 >39 mL/min   Anion gap 10 5 - 15  Glucose, capillary    Collection Time: 02/11/24 12:10 PM  Result Value Ref Range   Glucose-Capillary 164 (H) 70 - 99 mg/dL  Glucose, capillary   Collection Time: 02/11/24  4:46 PM  Result Value Ref Range   Glucose-Capillary 155 (H) 70 - 99 mg/dL  Glucose, capillary   Collection Time: 02/11/24  9:53 PM  Result Value Ref Range   Glucose-Capillary 159 (H) 70 - 99 mg/dL  Basic metabolic panel   Collection Time: 02/12/24  5:34 AM  Result Value Ref Range   Sodium 138 135 - 145 mmol/L   Potassium 3.5 3.5 - 5.1 mmol/L   Chloride 101 98 - 111 mmol/L   CO2 29 22 - 32 mmol/L   Glucose, Bld 139 (H) 70 - 99 mg/dL   BUN 9 8 - 23 mg/dL   Creatinine, Ser 9.25 0.61 - 1.24 mg/dL   Calcium  8.6 (L) 8.9 - 10.3 mg/dL   GFR, Estimated >39 >39 mL/min   Anion gap 8 5 - 15  Glucose, capillary   Collection Time: 02/12/24  8:21 AM  Result Value Ref Range   Glucose-Capillary 150 (H) 70 - 99 mg/dL  Glucose, capillary   Collection Time: 02/12/24 12:12 PM  Result Value Ref Range   Glucose-Capillary 137 (H) 70 - 99 mg/dL  Glucose, capillary   Collection Time: 02/12/24  4:35 PM  Result Value Ref Range   Glucose-Capillary 115 (H) 70 - 99 mg/dL  Glucose, capillary   Collection Time: 02/12/24  8:04 PM  Result Value Ref Range   Glucose-Capillary 142 (H) 70 - 99 mg/dL  Basic metabolic panel   Collection Time: 02/13/24  6:49 AM  Result Value Ref Range   Sodium 138 135 - 145 mmol/L   Potassium 3.7 3.5 - 5.1 mmol/L   Chloride 101 98 - 111 mmol/L   CO2 27 22 - 32 mmol/L   Glucose, Bld 84 70 - 99 mg/dL   BUN 15 8 - 23 mg/dL   Creatinine, Ser 9.21 0.61 - 1.24 mg/dL   Calcium  8.8 (L) 8.9 - 10.3 mg/dL   GFR, Estimated >39 >39 mL/min   Anion gap 10 5 - 15  Glucose, capillary   Collection Time: 02/13/24  8:18 AM  Result Value Ref Range   Glucose-Capillary 152 (H) 70 - 99 mg/dL   Comment 1 Notify RN   Glucose, capillary   Collection Time: 02/13/24 12:11 PM  Result Value Ref Range   Glucose-Capillary 130 (H) 70 - 99 mg/dL    Comment 1 Notify RN   Glucose, capillary   Collection Time: 02/13/24  5:01 PM  Result Value Ref Range   Glucose-Capillary 134 (H) 70 - 99 mg/dL   Comment 1 Notify RN   Glucose, capillary   Collection Time: 02/13/24  7:41 PM  Result Value Ref Range   Glucose-Capillary 141 (H) 70 - 99 mg/dL    Discharge Medications:   Allergies as of 02/14/2024       Reactions   Hydromorphone  Hcl Er Anaphylaxis, Other (See Comments)   Allergic to DYE in extended-release tablet, can tolerate other forms of hydromorphone    Nucynta [tapentadol] Anaphylaxis, Swelling, Other (See Comments)   Throat angioedema   Exalamide Other (See Comments)   Unknown reaction        Medication List     STOP taking these  medications    oxyCODONE  5 MG/5ML solution Commonly known as: ROXICODONE        TAKE these medications    acetaminophen  500 MG tablet Commonly known as: TYLENOL  Take 500 mg by mouth 3 (three) times daily.   apixaban  5 MG Tabs tablet Commonly known as: ELIQUIS  Take 1 tablet (5 mg total) by mouth 2 (two) times daily.   divalproex  250 MG DR tablet Commonly known as: DEPAKOTE  Take 250 mg by mouth 2 (two) times daily.   FreeStyle Libre 3 Reader [employer] Use as directed with lifestyle libre sensors   [employer] 3 Sensor Misc Place 1 sensor on the skin every 14 days. Use to check glucose continuously   furosemide  40 MG tablet Commonly known as: LASIX  Take 1 tablet (40 mg total) by mouth daily.   glipiZIDE  2.5 MG Tabs Take 2.5 mg by mouth 2 (two) times daily before a meal.   insulin  degludec 100 UNIT/ML FlexTouch Pen Commonly known as: TRESIBA  Inject 13 Units into the skin daily.   insulin  lispro 100 UNIT/ML injection Commonly known as: HUMALOG  Inject 0-12 Units into the skin See admin instructions. Administer 0-12 units before meals and at bedtime per sliding scale:       < 70 : contact NP/PA   71-200 : 0 units 201-250 : 2 units 251-300 : 4 units 301-350 : 6 units 351-400  : 8 units 401-450 : 10 units 451-600 : 12 units     > 450 : 12 units, recheck in 2 hours. If > 350 or < 100 notify provider   [name]  10 MG Tabs tablet Generic drug: empagliflozin  Take 10 mg by mouth daily before breakfast.   metoprolol  tartrate 100 MG tablet Commonly known as: LOPRESSOR  Take 2 hours prior to CT   oxyCODONE -acetaminophen  5-325 MG tablet Commonly known as: PERCOCET/ROXICET Take 1 tablet by mouth every 4 (four) hours as needed.   pantoprazole  40 MG tablet Commonly known as: PROTONIX  Take 1 tablet (40 mg total) by mouth daily.   Pen Needles 3/16 31G X 5 MM Misc Use as directed with insulin  pen   rosuvastatin  40 MG tablet Commonly known as: CRESTOR  Take 1 tablet (40 mg total) by mouth daily.   Suvorexant  5 MG Tabs Take 5 mg by mouth at bedtime.               Durable Medical Equipment  (From admission, onward)           Start     Ordered   02/13/24 0730  For home use only DME Vac  Once       Comments: Attach the wound VAC dressing to a Prevena plus portable wound VAC pump for discharge.   02/13/24 0729            Diagnostic Studies: No results found.  Disposition: Discharge disposition: [facility]       Discharge Instructions     Call MD / Call 911   Complete by: As directed    If you experience chest pain or shortness of breath, CALL 911 and be transported to the hospital emergency room.  If you develope a fever above 101 F, pus (white drainage) or increased drainage or redness at the wound, or calf pain, call your surgeon's office.   Call MD / Call 911   Complete by: As directed    If you experience chest pain or shortness of breath, CALL 911 and be transported to the hospital emergency room.  If you  develope a fever above 101 F, pus (white drainage) or increased drainage or redness at the wound, or calf pain, call your surgeon's office.   Call MD / Call 911   Complete by: As directed    If you experience chest pain  or shortness of breath, CALL 911 and be transported to the hospital emergency room.  If you develope a fever above 101 F, pus (white drainage) or increased drainage or redness at the wound, or calf pain, call your surgeon's office.   Constipation Prevention   Complete by: As directed    Drink plenty of fluids.  Prune juice may be helpful.  You may use a stool softener, such as Colace (over the counter) 100 mg twice a day.  Use MiraLax  (over the counter) for constipation as needed.   Constipation Prevention   Complete by: As directed    Drink plenty of fluids.  Prune juice may be helpful.  You may use a stool softener, such as Colace (over the counter) 100 mg twice a day.  Use MiraLax  (over the counter) for constipation as needed.   Constipation Prevention   Complete by: As directed    Drink plenty of fluids.  Prune juice may be helpful.  You may use a stool softener, such as Colace (over the counter) 100 mg twice a day.  Use MiraLax  (over the counter) for constipation as needed.   Diet - low sodium heart healthy   Complete by: As directed    Diet - low sodium heart healthy   Complete by: As directed    Diet - low sodium heart healthy   Complete by: As directed    Increase activity slowly as tolerated   Complete by: As directed    Increase activity slowly as tolerated   Complete by: As directed    Increase activity slowly as tolerated   Complete by: As directed    Negative Pressure Wound Therapy - Incisional   Complete by: As directed    Attach vac dressing to prevena pump for discharge   Post-operative opioid taper instructions:   Complete by: As directed    POST-OPERATIVE OPIOID TAPER INSTRUCTIONS: It is important to wean off of your opioid medication as soon as possible. If you do not need pain medication after your surgery it is ok to stop day one. Opioids include: Codeine, Hydrocodone (Norco, Vicodin), Oxycodone (Percocet, oxycontin ) and hydromorphone  amongst others.  Long term and  even short term use of opiods can cause: Increased pain response Dependence Constipation Depression Respiratory depression And more.  Withdrawal symptoms can include Flu like symptoms Nausea, vomiting And more Techniques to manage these symptoms Hydrate well Eat regular healthy meals Stay active Use relaxation techniques(deep breathing, meditating, yoga) Do Not substitute Alcohol to help with tapering If you have been on opioids for less than two weeks and do not have pain than it is ok to stop all together.  Plan to wean off of opioids This plan should start within one week post op of your joint replacement. Maintain the same interval or time between taking each dose and first decrease the dose.  Cut the total daily intake of opioids by one tablet each day Next start to increase the time between doses. The last dose that should be eliminated is the evening dose.      Post-operative opioid taper instructions:   Complete by: As directed    POST-OPERATIVE OPIOID TAPER INSTRUCTIONS: It is important to wean off of your opioid medication as soon  as possible. If you do not need pain medication after your surgery it is ok to stop day one. Opioids include: Codeine, Hydrocodone (Norco, Vicodin), Oxycodone (Percocet, oxycontin ) and hydromorphone  amongst others.  Long term and even short term use of opiods can cause: Increased pain response Dependence Constipation Depression Respiratory depression And more.  Withdrawal symptoms can include Flu like symptoms Nausea, vomiting And more Techniques to manage these symptoms Hydrate well Eat regular healthy meals Stay active Use relaxation techniques(deep breathing, meditating, yoga) Do Not substitute Alcohol to help with tapering If you have been on opioids for less than two weeks and do not have pain than it is ok to stop all together.  Plan to wean off of opioids This plan should start within one week post op of your joint  replacement. Maintain the same interval or time between taking each dose and first decrease the dose.  Cut the total daily intake of opioids by one tablet each day Next start to increase the time between doses. The last dose that should be eliminated is the evening dose.      Post-operative opioid taper instructions:   Complete by: As directed    POST-OPERATIVE OPIOID TAPER INSTRUCTIONS: It is important to wean off of your opioid medication as soon as possible. If you do not need pain medication after your surgery it is ok to stop day one. Opioids include: Codeine, Hydrocodone (Norco, Vicodin), Oxycodone (Percocet, oxycontin ) and hydromorphone  amongst others.  Long term and even short term use of opiods can cause: Increased pain response Dependence Constipation Depression Respiratory depression And more.  Withdrawal symptoms can include Flu like symptoms Nausea, vomiting And more Techniques to manage these symptoms Hydrate well Eat regular healthy meals Stay active Use relaxation techniques(deep breathing, meditating, yoga) Do Not substitute Alcohol to help with tapering If you have been on opioids for less than two weeks and do not have pain than it is ok to stop all together.  Plan to wean off of opioids This plan should start within one week post op of your joint replacement. Maintain the same interval or time between taking each dose and first decrease the dose.  Cut the total daily intake of opioids by one tablet each day Next start to increase the time between doses. The last dose that should be eliminated is the evening dose.           Follow-up Information     Harden Jerona GAILS, MD Follow up in 1 week(s).   Specialty: Orthopedic Surgery Contact information: [address] [place]  [place] [address] [phone]                  Signed: Jerona GAILS Harden 02/14/2024, 6:40 AM

## 2024-02-17 ENCOUNTER — Ambulatory Visit (INDEPENDENT_AMBULATORY_CARE_PROVIDER_SITE_OTHER): Admitting: Family

## 2024-02-17 DIAGNOSIS — Z89511 Acquired absence of right leg below knee: Secondary | ICD-10-CM

## 2024-02-17 NOTE — Progress Notes (Signed)
 Post-Op Visit Note   Patient: Troy [name]           Date of Birth: 07/06/1960           MRN: [id number] Visit Date: 02/17/2024 PCP: Baird Comer GAILS, NP  Chief Complaint:  Chief Complaint  Patient presents with   Right Leg - Routine Post Op    HPI:  HPI The patient is a 63 year old gentleman who is seen status post right below-knee amputation revision he did have wound VAC placement.  This was removed today.  No concerns voiced Ortho Exam On examination right residual limb this is well-approximated with sutures there is no gaping no sign of infection  Visit Diagnoses: No diagnosis found.  Plan: Begin daily Dial  soap cleansing.  Dry dressings.  Strength around-the-clock.  Follow-up in 2 weeks for suture removal. Follow-Up Instructions: No follow-ups on file.   Imaging: No results found.  Orders:  No orders of the defined types were placed in this encounter.  No orders of the defined types were placed in this encounter.    PMFS History: Patient Active Problem List   Diagnosis Date Noted   Non-healing wound of amputation stump (HCC) 02/10/2024   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   Ventricular aneurysm 04/08/2023   (HFpEF) heart failure with preserved ejection fraction (HCC) 04/02/2023   Lactic acidosis 04/02/2023   Acute respiratory failure with hypoxia (HCC) 04/02/2023   Leukocytosis 04/02/2023   Uncontrolled type 2 diabetes mellitus with hyperglycemia, with long-term current use of insulin  (HCC) 04/02/2023   Hypochromic anemia 04/02/2023   PVD (peripheral vascular disease) (HCC) 04/02/2023   Thrombocytosis 04/02/2023   Respiratory distress 03/21/2023   Osteomyelitis of ankle or foot, acute, right (HCC) 03/11/2023   Abscess of right lower leg 03/11/2023   Below-knee amputation of right lower extremity (HCC) 03/11/2023   Chronic respiratory failure with hypoxia (HCC) 02/12/2023   Pulmonary infiltrates 02/12/2023   Acute on chronic heart failure with preserved  ejection fraction (HFpEF) (HCC) 02/12/2023   Chronic osteomyelitis of right foot with draining sinus (HCC) 02/07/2023   Paroxysmal atrial flutter (HCC) 02/05/2023   Hypotension 02/05/2023   Atrial fibrillation with RVR (HCC) 02/04/2023   Atrial flutter (HCC) 02/04/2023   Lower limb ischemia: ??? 08/22/2021   Diabetic acidosis without coma (HCC)    Hypokalemia    Hypomagnesemia    Hyponatremia    Multifocal pneumonia    Parapneumonic effusion 08/18/2021   Bowel Ileus (HCC) 08/18/2021   ?? 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   Ketoacidosis due to type 2 diabetes mellitus (HCC) 08/15/2021   COVID-19 virus infection 08/15/2021   S/P AKA (above knee amputation), left (HCC) 02/12/2021   Amputation of right great toe and 2nd Toe 02/12/2021   Sepsis due to left-sided neck cellulitis/MRSA 02/12/2021   Hypotensive episode    Elevated troponin    SIRS (systemic inflammatory response syndrome) (HCC) 02/11/2021   Abscess of bursa of left ankle 01/21/2021   Dehiscence of amputation stump (HCC)    Ischemia of left BKA site (HCC)    Abscess of leg without foot, left    Osteomyelitis of second toe of right foot (HCC) 07/12/2019   Below knee amputation status, left 01/25/2018   Dehiscence of amputation stump of right lower extremity (HCC)    Status post left foot surgery 12/15/2017   GERD (gastroesophageal reflux disease) 09/26/2017   Hyperlipidemia associated with type 2 diabetes mellitus (HCC) 07/08/2017   Snoring 06/07/2016   Chest tube in place 01/22/2016  Abnormal nuclear stress test 01/22/2016   Pain, chronic due to trauma 07/04/2012   Complex regional pain syndrome I of lower limb 07/04/2012   COPD (chronic obstructive pulmonary disease) (HCC) 01/27/2011   Pre-operative cardiovascular examination 01/27/2011   Nonspecific abnormal electrocardiogram (ECG) (EKG) 01/27/2011   Murmur 01/27/2011   DM2 (diabetes mellitus, type 2) (HCC)    HTN (hypertension)    HLD (hyperlipidemia)     Tobacco abuse    Past Medical History:  Diagnosis Date   Acute renal failure (HCC)    in setting of NSAID use and orthopedic surgery 2010   Anxiety and depression    Chronic diastolic CHF (congestive heart failure) (HCC)    a. Echo 6/17: severe conc LVH, vigorous EF, EF 65-70%, no dynamic obstruction, no RWMA, Gr 1 DD, mild TR  //  b. 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 LHC 2002: irregs  //  b. LHC in 8/17: no sig CAD, apical DK, hyperdynamic LV, LVEDP 28   History of kidney stones    History of nuclear stress test    a. Nuc 7/17: Overall, intermediate risk nuclear stress test secondary to small size of apical lateral defect and reduced ejection fraction.  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    Kidney calculi    Palpitations    Pneumonia    PTSD (post-traumatic stress disorder)    Tobacco abuse    Transient ischemic attack     Family History  Problem Relation Age of Onset   Leukemia Mother 106       died   Lung cancer Father 22       died   Heart attack Brother 35   Heart attack Brother 39   Hypertension Brother        X3   Hypertension Sister        X2   Diabetes Sister    Stroke Sister    Diabetes Sister    Other Brother        Set designer accident    Past Surgical History:  Procedure Laterality Date   ABDOMINAL AORTOGRAM W/LOWER EXTREMITY N/A 01/31/2023   Procedure: ABDOMINAL AORTOGRAM W/LOWER EXTREMITY;  Surgeon: Sheree Penne Bruckner, MD;  Location: [facility] INVASIVE CV LAB;  Service: Cardiovascular;   Laterality: N/A;   AMPUTATION Left 01/03/2018   Procedure: LEFT MIDFOOT AMPUTATION/REVISION MIDAMPUTAION;  Surgeon: Vernetta Bruckner GRADE, MD;  Location: [facility] OR;  Service: Orthopedics;  Laterality: Left;   AMPUTATION Left 01/25/2018   Procedure: LEFT BELOW KNEE AMPUTATION;  Surgeon: Harden Jerona GAILS, MD;  Location: [facility] OR;  Service: Orthopedics;  Laterality: Left;   AMPUTATION Left 01/21/2021   Procedure: LEFT ABOVE KNEE AMPUTATION;  Surgeon: Harden Jerona GAILS, MD;  Location: [facility] OR;  Service: Orthopedics;  Laterality: Left;   AMPUTATION Right 02/09/2023   Procedure: RIGHT TRANSMETATARSAL AMPUTATION;  Surgeon: Harden Jerona GAILS,  MD;  Location: [facility] OR;  Service: Orthopedics;  Laterality: Right;   AMPUTATION Right 03/11/2023   Procedure: RIGHT BELOW KNEE AMPUTATION;  Surgeon: Harden Jerona GAILS, MD;  Location: [facility] OR;  Service: Orthopedics;  Laterality: Right;   AMPUTATION TOE Right 07/17/2019   Procedure: AMPUTATION RIGHT FOOT 2ND TOE;  Surgeon: Vernetta Lonni GRADE, MD;  Location: [facility];  Service: Orthopedics;  Laterality: Right;   APPLICATION OF WOUND VAC Left 01/21/2021   Procedure: APPLICATION OF WOUND VAC;  Surgeon: Harden Jerona GAILS, MD;  Location: [facility] OR;  Service: Orthopedics;  Laterality: Left;   APPLICATION OF WOUND VAC Right 02/10/2024   Procedure: APPLICATION, WOUND VAC;  Surgeon: Harden Jerona GAILS, MD;  Location: [facility] OR;  Service: Orthopedics;  Laterality: Right;   BELOW KNEE LEG AMPUTATION Left 01/25/2018   CARDIAC CATHETERIZATION N/A 01/22/2016   Procedure: Left Heart Cath and Coronary Angiography;  Surgeon: Peter M Swaziland, MD;  Location: [facility] INVASIVE CV LAB;  Service: Cardiovascular;  Laterality: N/A;   CARDIOVERSION N/A 03/17/2023   Procedure: CARDIOVERSION;  Surgeon: Mona Vinie BROCKS, MD;  Location: [facility] INVASIVE CV LAB;  Service: Cardiovascular;  Laterality: N/A;   FOOT AMPUTATION Bilateral    I & D EXTREMITY Left 12/15/2017   Procedure: IRRIGATION AND DEBRIDEMENT LEFT FOOT ULCER;  Surgeon:  Vernetta Lonni GRADE, MD;  Location: [facility] ORS;  Service: Orthopedics;  Laterality: Left;   I & D EXTREMITY Left 07/25/2020   Procedure: LEFT BELOW KNEE AMPUTATION ABSCESS EXCISION AND SKIN GRAFT;  Surgeon: Harden Jerona GAILS, MD;  Location: [facility] OR;  Service: Orthopedics;  Laterality: Left;   I & D EXTREMITY Left 08/22/2020   Procedure: DEBRIDEMENT LEFT BELOW KNEE AMPUTATION AND APPLY KERECIS SKIN GRAFT;  Surgeon: Harden Jerona GAILS, MD;  Location: [facility] OR;  Service: Orthopedics;  Laterality: Left;   LITHOTRIPSY     PERIPHERAL VASCULAR INTERVENTION  01/31/2023   Procedure: PERIPHERAL VASCULAR INTERVENTION;  Surgeon: Sheree Penne Lonni, MD;  Location: [facility] INVASIVE CV LAB;  Service: Cardiovascular;;   REVISION AMPUTATION, BELOW THE KNEE Right 02/10/2024   Procedure: REVISION AMPUTATION, BELOW THE KNEE;  Surgeon: Harden Jerona GAILS, MD;  Location: [facility] OR;  Service: Orthopedics;  Laterality: Right;   TEE WITHOUT CARDIOVERSION N/A 03/17/2023   Procedure: TRANSESOPHAGEAL ECHOCARDIOGRAM;  Surgeon: Mona Vinie BROCKS, MD;  Location: [facility] INVASIVE CV LAB;  Service: Cardiovascular;  Laterality: N/A;   TENDON LENGTHENING Bilateral    calf   TONSILLECTOMY     Social History   Occupational History   Occupation: DISABLED  Tobacco Use   Smoking status: Former    Current packs/day: 1.00    Average packs/day: 1 pack/day for 40.0 years (40.0 ttl pk-yrs)    Types: Cigarettes   Smokeless tobacco: Never  Vaping Use   Vaping status: Never Used  Substance and Sexual Activity   Alcohol use: No   Drug use: No   Sexual activity: Not on file

## 2024-02-21 ENCOUNTER — Ambulatory Visit (INDEPENDENT_AMBULATORY_CARE_PROVIDER_SITE_OTHER): Admitting: Orthopedic Surgery

## 2024-02-21 DIAGNOSIS — T8781 Dehiscence of amputation stump: Secondary | ICD-10-CM

## 2024-02-21 DIAGNOSIS — Z89511 Acquired absence of right leg below knee: Secondary | ICD-10-CM

## 2024-02-21 DIAGNOSIS — Z89612 Acquired absence of left leg above knee: Secondary | ICD-10-CM

## 2024-02-22 ENCOUNTER — Encounter: Payer: Self-pay | Admitting: Orthopedic Surgery

## 2024-02-22 NOTE — Progress Notes (Signed)
 Office Visit Note   Patient: Troy [name]           Date of Birth: [birth year]           MRN: [id number] Visit Date: 02/21/2024              Requested by: Baird Comer GAILS, NP [address] [place],  [place] [address] PCP: Baird Comer GAILS, NP  Chief Complaint  Patient presents with   Right Leg - Routine Post Op    02/10/24 revision right BKA      HPI: Discussed the use of AI scribe software for clinical note transcription with the patient, who gave verbal consent to proceed.  History of Present Illness Troy [name] is a 63 year old male with a history of MRSA who presents with an infected wound with drainage.  He reports clear serosanguineous drainage from the wound. He has not been taking any antibiotics recently and is keeping the wound elevated as best as he can.  Previous cultures were positive for MRSA, which was sensitive to both doxycycline  and Bactrim  DS. He has not been on any antibiotics since the last culture results.  He mentions that the infection was propagated in the nursing home, which is the only place he has been recently.     Assessment & Plan: Visit Diagnoses: No diagnosis found.  Plan: Assessment and Plan Assessment & Plan Infection of right below knee amputation stump Infection with serosanguineous drainage and tenderness, no ascending cellulitis. Cultures positive for MRSA, sensitive to doxycycline  and Bactrim  DS. - Prescribed doxycycline  due to higher sensitivity and fewer side effects. - Continue dressing changes. - Re-evaluate in one week.      Follow-Up Instructions: Return in about 4 weeks (around 03/20/2024).   Ortho Exam  Patient is alert, oriented, no adenopathy, well-dressed, normal affect, normal respiratory effort. Physical Exam EXTREMITIES: Left above knee amputation, right below knee amputation with clear serosanguineous drainage. Wound edges well approximated, tender to palpation, no ascending  cellulitis.      Imaging: No results found. No images are attached to the encounter.  Labs: Lab Results  Component Value Date   HGBA1C 7.5 (H) 02/10/2024   HGBA1C 11.7 (H) 02/05/2023   HGBA1C 10.8 (H) 08/18/2021   ESRSEDRATE 110 (H) 02/13/2023   ESRSEDRATE 56 (H) 02/07/2023   CRP 13.0 (H) 02/07/2023   CRP 22.7 (H) 08/20/2021   CRP 23.2 (H) 08/20/2021   REPTSTATUS 04/07/2023 FINAL 04/02/2023   GRAMSTAIN  03/11/2023    ABUNDANT WBC PRESENT, PREDOMINANTLY PMN RARE GRAM POSITIVE COCCI IN PAIRS    CULT  04/02/2023    NO GROWTH 5 DAYS Performed at [facility], [address], [place], [place] [address]    LABORGA METHICILLIN RESISTANT STAPHYLOCOCCUS AUREUS 03/11/2023     Lab Results  Component Value Date   ALBUMIN 3.4 (L) 02/10/2024   ALBUMIN 3.9 06/08/2023   ALBUMIN 3.9 04/27/2023   PREALBUMIN 5 (L) 03/11/2023    Lab Results  Component Value Date   MG 1.7 03/18/2023   MG 1.6 (L) 02/13/2023   MG 1.8 02/12/2023   No results found for: [facility]  Lab Results  Component Value Date   PREALBUMIN 5 (L) 03/11/2023      Latest Ref Rng & Units 02/10/2024    6:12 AM 04/08/2023    7:47 AM 04/07/2023    5:23 AM  CBC EXTENDED  WBC 4.0 - 10.5 K/uL 13.1  11.2  13.0  RBC 4.22 - 5.81 MIL/uL 5.23  4.58  4.46   Hemoglobin 13.0 - 17.0 g/dL 88.4  89.3  89.3   HCT 39.0 - 52.0 % 38.8  35.2  35.3   Platelets 150 - 400 K/uL 255  385  399   NEUT# 1.7 - 7.7 K/uL 8.6  6.8  8.5   Lymph# 0.7 - 4.0 K/uL 3.0  3.0  3.0      There is no height or weight on file to calculate BMI.  Orders:  No orders of the defined types were placed in this encounter.  No orders of the defined types were placed in this encounter.    Procedures: No procedures performed  Clinical Data: No additional findings.  ROS:  All other systems negative, except as noted in the HPI. Review of Systems  Objective: Vital Signs: There were no vitals taken for this visit.  Specialty Comments:  No  specialty comments available.  PMFS History: Patient Active Problem List   Diagnosis Date Noted   Non-healing wound of amputation stump (HCC) 02/10/2024   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   Ventricular aneurysm 04/08/2023   (HFpEF) heart failure with preserved ejection fraction (HCC) 04/02/2023   Lactic acidosis 04/02/2023   Acute respiratory failure with hypoxia (HCC) 04/02/2023   Leukocytosis 04/02/2023   Uncontrolled type 2 diabetes mellitus with hyperglycemia, with long-term current use of insulin  (HCC) 04/02/2023   Hypochromic anemia 04/02/2023   PVD (peripheral vascular disease) (HCC) 04/02/2023   Thrombocytosis 04/02/2023   Respiratory distress 03/21/2023   Osteomyelitis of ankle or foot, acute, right (HCC) 03/11/2023   Abscess of right lower leg 03/11/2023   Below-knee amputation of right lower extremity (HCC) 03/11/2023   Chronic respiratory failure with hypoxia (HCC) 02/12/2023   Pulmonary infiltrates 02/12/2023   Acute on chronic heart failure with preserved ejection fraction (HFpEF) (HCC) 02/12/2023   Chronic osteomyelitis of right foot with draining sinus (HCC) 02/07/2023   Paroxysmal atrial flutter (HCC) 02/05/2023   Hypotension 02/05/2023   Atrial fibrillation with RVR (HCC) 02/04/2023   Atrial flutter (HCC) 02/04/2023   Lower limb ischemia: ??? 08/22/2021   Diabetic acidosis without coma (HCC)    Hypokalemia    Hypomagnesemia    Hyponatremia    Multifocal pneumonia    Parapneumonic effusion 08/18/2021   Bowel Ileus (HCC) 08/18/2021   ?? 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   Ketoacidosis due to type 2 diabetes mellitus (HCC) 08/15/2021   COVID-19 virus infection 08/15/2021   S/P AKA (above knee amputation), left (HCC) 02/12/2021   Amputation of right great toe and 2nd Toe 02/12/2021   Sepsis due to left-sided neck cellulitis/MRSA 02/12/2021   Hypotensive episode    Elevated troponin    SIRS (systemic inflammatory response syndrome) (HCC) 02/11/2021   Abscess of  bursa of left ankle 01/21/2021   Dehiscence of amputation stump (HCC)    Ischemia of left BKA site (HCC)    Abscess of leg without foot, left    Osteomyelitis of second toe of right foot (HCC) 07/12/2019   Below knee amputation status, left 01/25/2018   Dehiscence of amputation stump of right lower extremity (HCC)    Status post left foot surgery 12/15/2017   GERD (gastroesophageal reflux disease) 09/26/2017   Hyperlipidemia associated with type 2 diabetes mellitus (HCC) 07/08/2017   Snoring 06/07/2016   Chest tube in place 01/22/2016   Abnormal nuclear stress test 01/22/2016   Pain, chronic due to trauma 07/04/2012   Complex regional pain syndrome I of  lower limb 07/04/2012   COPD (chronic obstructive pulmonary disease) (HCC) 01/27/2011   Pre-operative cardiovascular examination 01/27/2011   Nonspecific abnormal electrocardiogram (ECG) (EKG) 01/27/2011   Murmur 01/27/2011   DM2 (diabetes mellitus, type 2) (HCC)    HTN (hypertension)    HLD (hyperlipidemia)    Tobacco abuse    Past Medical History:  Diagnosis Date   Acute renal failure (HCC)    in setting of NSAID use and orthopedic surgery 2010   Anxiety and depression    Chronic diastolic CHF (congestive heart failure) (HCC)    a. Echo 6/17: severe conc LVH, vigorous EF, EF 65-70%, no dynamic obstruction, no RWMA, Gr 1 DD, mild TR  //  b. 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 LHC 2002: irregs  //  b. LHC in 8/17: no sig CAD, apical DK, hyperdynamic LV, LVEDP 28   History of kidney stones    History of nuclear stress test    a. Nuc 7/17: Overall, intermediate risk nuclear stress  test secondary to small size of apical lateral defect and reduced ejection fraction.  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    Kidney calculi    Palpitations    Pneumonia    PTSD (post-traumatic stress disorder)    Tobacco abuse    Transient ischemic attack     Family History  Problem Relation Age of Onset   Leukemia Mother 38       died   Lung cancer Father 39       died   Heart attack Brother 62   Heart attack Brother 57   Hypertension Brother        X3   Hypertension Sister        X2   Diabetes Sister    Stroke Sister    Diabetes Sister    Other Brother        Set designer accident    Past Surgical History:  Procedure Laterality Date   ABDOMINAL AORTOGRAM W/LOWER EXTREMITY N/A 01/31/2023   Procedure: ABDOMINAL AORTOGRAM W/LOWER EXTREMITY;  Surgeon: Sheree Penne Bruckner, MD;  Location: [facility] INVASIVE CV LAB;  Service: Cardiovascular;  Laterality: N/A;   AMPUTATION Left 01/03/2018   Procedure: LEFT MIDFOOT AMPUTATION/REVISION MIDAMPUTAION;  Surgeon: Vernetta Bruckner GRADE, MD;  Location: [facility] OR;  Service: Orthopedics;  Laterality: Left;   AMPUTATION Left 01/25/2018   Procedure: LEFT BELOW KNEE AMPUTATION;  Surgeon: Harden Jerona GAILS, MD;  Location: [facility] OR;  Service: Orthopedics;  Laterality: Left;   AMPUTATION Left 01/21/2021   Procedure: LEFT ABOVE KNEE AMPUTATION;  Surgeon: Harden Jerona GAILS, MD;  Location: [facility] OR;  Service: Orthopedics;  Laterality: Left;   AMPUTATION Right 02/09/2023   Procedure: RIGHT TRANSMETATARSAL AMPUTATION;  Surgeon: Harden Jerona GAILS, MD;  Location: [facility] OR;  Service: Orthopedics;  Laterality: Right;   AMPUTATION Right 03/11/2023   Procedure: RIGHT BELOW KNEE  AMPUTATION;  Surgeon: Harden Jerona GAILS, MD;  Location: [facility] OR;  Service: Orthopedics;  Laterality: Right;   AMPUTATION TOE Right 07/17/2019   Procedure: AMPUTATION RIGHT FOOT 2ND TOE;  Surgeon: Vernetta Lonni GRADE, MD;  Location: [facility];  Service: Orthopedics;  Laterality: Right;   APPLICATION OF WOUND VAC Left 01/21/2021   Procedure: APPLICATION OF WOUND VAC;  Surgeon: Harden Jerona GAILS, MD;  Location: [facility] OR;  Service: Orthopedics;  Laterality: Left;   APPLICATION OF WOUND VAC Right 02/10/2024   Procedure: APPLICATION, WOUND VAC;  Surgeon: Harden Jerona GAILS, MD;  Location: [facility] OR;  Service: Orthopedics;  Laterality: Right;   BELOW KNEE LEG AMPUTATION Left 01/25/2018   CARDIAC CATHETERIZATION N/A 01/22/2016   Procedure: Left Heart Cath and Coronary Angiography;  Surgeon: Peter M Swaziland, MD;  Location: [facility] INVASIVE CV LAB;  Service: Cardiovascular;  Laterality: N/A;   CARDIOVERSION N/A 03/17/2023   Procedure: CARDIOVERSION;  Surgeon: Mona Vinie BROCKS, MD;  Location: [facility] INVASIVE CV LAB;  Service: Cardiovascular;  Laterality: N/A;   FOOT AMPUTATION Bilateral    I & D EXTREMITY Left 12/15/2017   Procedure: IRRIGATION AND DEBRIDEMENT LEFT FOOT ULCER;  Surgeon: Vernetta Lonni GRADE, MD;  Location: [facility];  Service: Orthopedics;  Laterality: Left;   I & D EXTREMITY Left 07/25/2020   Procedure: LEFT BELOW KNEE AMPUTATION ABSCESS EXCISION AND SKIN GRAFT;  Surgeon: Harden Jerona GAILS, MD;  Location: [facility] OR;  Service: Orthopedics;  Laterality: Left;   I & D EXTREMITY Left 08/22/2020   Procedure: DEBRIDEMENT LEFT BELOW KNEE AMPUTATION AND APPLY KERECIS SKIN GRAFT;  Surgeon: Harden Jerona GAILS, MD;  Location: [facility] OR;  Service: Orthopedics;  Laterality: Left;   LITHOTRIPSY     PERIPHERAL VASCULAR INTERVENTION  01/31/2023   Procedure: PERIPHERAL VASCULAR INTERVENTION;  Surgeon: Sheree Penne Lonni, MD;  Location: [facility] INVASIVE CV LAB;  Service: Cardiovascular;;   REVISION AMPUTATION, BELOW THE KNEE Right 02/10/2024   Procedure: REVISION AMPUTATION, BELOW THE KNEE;  Surgeon: Harden Jerona GAILS, MD;  Location: [facility] OR;  Service: Orthopedics;  Laterality: Right;   TEE WITHOUT CARDIOVERSION N/A 03/17/2023   Procedure:  TRANSESOPHAGEAL ECHOCARDIOGRAM;  Surgeon: Mona Vinie BROCKS, MD;  Location: [facility] INVASIVE CV LAB;  Service: Cardiovascular;  Laterality: N/A;   TENDON LENGTHENING Bilateral    calf   TONSILLECTOMY     Social History   Occupational History   Occupation: DISABLED  Tobacco Use   Smoking status: Former    Current packs/day: 1.00    Average packs/day: 1 pack/day for 40.0 years (40.0 ttl pk-yrs)    Types: Cigarettes   Smokeless tobacco: Never  Vaping Use   Vaping status: Never Used  Substance and Sexual Activity   Alcohol use: No   Drug use: No   Sexual activity: Not on file

## 2024-02-27 ENCOUNTER — Encounter: Admitting: Physician Assistant

## 2024-02-27 ENCOUNTER — Telehealth: Payer: Self-pay

## 2024-02-27 NOTE — Telephone Encounter (Signed)
 Noted

## 2024-02-27 NOTE — Telephone Encounter (Signed)
 ORIE Raisin with [facility] called stating that patient has 4 sites on incision site of right BKA and has increased drainage.  Patient has a scheduled appt.on Tuesday, 02/28/2024.  Cb# [phone].

## 2024-02-28 ENCOUNTER — Ambulatory Visit (INDEPENDENT_AMBULATORY_CARE_PROVIDER_SITE_OTHER): Admitting: Family

## 2024-02-28 DIAGNOSIS — S88111S Complete traumatic amputation at level between knee and ankle, right lower leg, sequela: Secondary | ICD-10-CM

## 2024-02-28 DIAGNOSIS — Z89511 Acquired absence of right leg below knee: Secondary | ICD-10-CM

## 2024-02-28 MED ORDER — DOXYCYCLINE HYCLATE 100 MG PO TABS: 100.0000 mg | ORAL_TABLET | Freq: Two times a day (BID) | ORAL | 0 refills | Status: DC

## 2024-02-28 NOTE — Progress Notes (Signed)
 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  There is concern that he may be kneeling on the residual limb.  Just in bed as he is status post above-knee amputation on the left Ortho Exam On examination right residual limb there is dehiscence of the incision sutures are in place this probes 2 cm deep there is exposed tibia there is no purulence or surrounding erythema  Visit Diagnoses: No diagnosis found.  Plan: Plan to proceed with above-knee amputation on the right.  Patient is in agreement with the plan.  Follow-Up Instructions: No follow-ups on file.   Imaging: No results found.  Orders:  No orders of the defined types were placed in this encounter.  No orders of the defined types were placed in this encounter.    PMFS History: Patient Active Problem List   Diagnosis Date Noted   Non-healing wound of amputation stump (HCC) 02/10/2024   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   Ventricular aneurysm 04/08/2023   (HFpEF) heart failure with preserved ejection fraction (HCC) 04/02/2023   Lactic acidosis 04/02/2023   Acute respiratory failure with hypoxia (HCC) 04/02/2023   Leukocytosis 04/02/2023   Uncontrolled type 2 diabetes mellitus with hyperglycemia, with long-term current use of insulin  (HCC) 04/02/2023   Hypochromic anemia 04/02/2023   PVD (peripheral vascular disease) (HCC) 04/02/2023   Thrombocytosis 04/02/2023   Respiratory distress 03/21/2023   Osteomyelitis of ankle or foot, acute, right (HCC) 03/11/2023   Abscess of right lower  leg 03/11/2023   Below-knee amputation of right lower extremity (HCC) 03/11/2023   Chronic respiratory failure with hypoxia (HCC) 02/12/2023   Pulmonary infiltrates 02/12/2023   Acute on chronic heart failure with preserved ejection fraction (HFpEF) (HCC) 02/12/2023   Chronic osteomyelitis of right foot with draining sinus (HCC) 02/07/2023   Paroxysmal atrial flutter (HCC) 02/05/2023   Hypotension 02/05/2023   Atrial fibrillation with RVR (HCC) 02/04/2023   Atrial flutter (HCC) 02/04/2023   Lower limb ischemia: ??? 08/22/2021   Diabetic acidosis without coma (HCC)    Hypokalemia    Hypomagnesemia    Hyponatremia    Multifocal pneumonia    Parapneumonic effusion 08/18/2021   Bowel Ileus (HCC) 08/18/2021   ?? 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   Ketoacidosis due to type 2 diabetes mellitus (HCC) 08/15/2021   COVID-19 virus infection 08/15/2021   S/P AKA (above knee amputation), left (HCC) 02/12/2021   Amputation of right great toe and 2nd Toe 02/12/2021   Sepsis due to left-sided neck cellulitis/MRSA 02/12/2021   Hypotensive episode    Elevated troponin    SIRS (systemic inflammatory response syndrome) (HCC) 02/11/2021   Abscess of bursa of left ankle 01/21/2021   Dehiscence of amputation stump (HCC)    Ischemia of left BKA site (HCC)    Abscess of leg without foot, left    Osteomyelitis of second toe of right foot (HCC) 07/12/2019   Below knee amputation status, left 01/25/2018  Dehiscence of amputation stump of right lower extremity (HCC)    Status post left foot surgery 12/15/2017   GERD (gastroesophageal reflux disease) 09/26/2017   Hyperlipidemia associated with type 2 diabetes mellitus (HCC) 07/08/2017   Snoring 06/07/2016   Chest tube in place 01/22/2016   Abnormal nuclear stress test 01/22/2016   Pain, chronic due to trauma 07/04/2012   Complex regional pain syndrome I of lower limb 07/04/2012   COPD (chronic obstructive pulmonary disease) (HCC) 01/27/2011    Pre-operative cardiovascular examination 01/27/2011   Nonspecific abnormal electrocardiogram (ECG) (EKG) 01/27/2011   Murmur 01/27/2011   DM2 (diabetes mellitus, type 2) (HCC)    HTN (hypertension)    HLD (hyperlipidemia)    Tobacco abuse    Past Medical History:  Diagnosis Date   Acute renal failure (HCC)    in setting of NSAID use and orthopedic surgery 2010   Anxiety and depression    Chronic diastolic CHF (congestive heart failure) (HCC)    a. Echo 6/17: severe conc LVH, vigorous EF, EF 65-70%, no dynamic obstruction, no RWMA, Gr 1 DD, mild TR  //  b. 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 LHC 2002: irregs  //  b. LHC in 8/17: no sig CAD, apical DK, hyperdynamic LV, LVEDP 28   History of kidney stones    History of nuclear stress test    a. Nuc 7/17: Overall, intermediate risk nuclear stress test secondary to small size of apical lateral defect and reduced ejection fraction.  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    Kidney calculi    Palpitations    Pneumonia    PTSD (post-traumatic stress disorder)    Tobacco abuse    Transient ischemic attack     Family History  Problem Relation Age of Onset   Leukemia Mother 56       died   Lung cancer Father 36       died   Heart attack Brother 32   Heart attack Brother 46   Hypertension Brother        X3   Hypertension Sister        X2   Diabetes Sister    Stroke Sister    Diabetes Sister    Other Brother        Set designer accident    Past Surgical History:  Procedure  Laterality Date   ABDOMINAL AORTOGRAM W/LOWER EXTREMITY N/A 01/31/2023   Procedure: ABDOMINAL AORTOGRAM W/LOWER EXTREMITY;  Surgeon: Sheree Penne Bruckner, MD;  Location: [facility] INVASIVE CV LAB;  Service: Cardiovascular;  Laterality: N/A;   AMPUTATION Left 01/03/2018   Procedure: LEFT MIDFOOT AMPUTATION/REVISION MIDAMPUTAION;  Surgeon: Vernetta Bruckner GRADE, MD;  Location: [facility] OR;  Service: Orthopedics;  Laterality: Left;   AMPUTATION Left 01/25/2018   Procedure: LEFT BELOW KNEE AMPUTATION;  Surgeon: Harden Jerona GAILS, MD;  Location: [facility] OR;  Service: Orthopedics;  Laterality: Left;   AMPUTATION Left 01/21/2021   Procedure: LEFT ABOVE KNEE AMPUTATION;  Surgeon: Harden Jerona GAILS, MD;  Location: [facility] OR;  Service: Orthopedics;  Laterality: Left;   AMPUTATION Right 02/09/2023   Procedure: RIGHT TRANSMETATARSAL AMPUTATION;  Surgeon: Harden Jerona GAILS, MD;  Location: [facility] OR;  Service: Orthopedics;  Laterality: Right;   AMPUTATION Right 03/11/2023   Procedure: RIGHT BELOW KNEE AMPUTATION;  Surgeon: Harden Jerona GAILS, MD;  Location: [facility] OR;  Service: Orthopedics;  Laterality: Right;   AMPUTATION TOE Right 07/17/2019   Procedure: AMPUTATION RIGHT FOOT 2ND TOE;  Surgeon: Vernetta Lonni GRADE, MD;  Location: [facility];  Service: Orthopedics;  Laterality: Right;   APPLICATION OF WOUND VAC Left 01/21/2021   Procedure: APPLICATION OF WOUND VAC;  Surgeon: Harden Jerona GAILS, MD;  Location: [facility] OR;  Service: Orthopedics;  Laterality: Left;   APPLICATION OF WOUND VAC Right 02/10/2024   Procedure: APPLICATION, WOUND VAC;  Surgeon: Harden Jerona GAILS, MD;  Location: [facility] OR;  Service: Orthopedics;  Laterality: Right;   BELOW KNEE LEG AMPUTATION Left 01/25/2018   CARDIAC CATHETERIZATION N/A 01/22/2016   Procedure: Left Heart Cath and Coronary Angiography;  Surgeon: Peter M Swaziland, MD;  Location: [facility] INVASIVE CV LAB;  Service: Cardiovascular;  Laterality: N/A;   CARDIOVERSION N/A 03/17/2023   Procedure: CARDIOVERSION;  Surgeon: Mona Vinie BROCKS, MD;  Location: [facility] INVASIVE CV LAB;  Service: Cardiovascular;  Laterality: N/A;   FOOT AMPUTATION Bilateral    I & D EXTREMITY Left 12/15/2017   Procedure: IRRIGATION AND DEBRIDEMENT LEFT FOOT ULCER;  Surgeon: Vernetta Lonni GRADE, MD;  Location: [facility] ORS;  Service: Orthopedics;  Laterality: Left;   I & D EXTREMITY Left 07/25/2020   Procedure: LEFT BELOW KNEE AMPUTATION ABSCESS EXCISION AND SKIN GRAFT;  Surgeon: Harden Jerona GAILS, MD;  Location: [facility] OR;  Service: Orthopedics;  Laterality: Left;   I & D EXTREMITY Left 08/22/2020   Procedure: DEBRIDEMENT LEFT BELOW KNEE AMPUTATION AND APPLY KERECIS SKIN GRAFT;  Surgeon: Harden Jerona GAILS, MD;  Location: [facility] OR;  Service: Orthopedics;  Laterality: Left;   LITHOTRIPSY     PERIPHERAL VASCULAR INTERVENTION  01/31/2023   Procedure: PERIPHERAL VASCULAR INTERVENTION;  Surgeon: Sheree Penne Lonni, MD;  Location: [facility] INVASIVE CV LAB;  Service: Cardiovascular;;   REVISION AMPUTATION, BELOW THE KNEE Right 02/10/2024   Procedure: REVISION AMPUTATION, BELOW THE KNEE;  Surgeon: Harden Jerona GAILS, MD;  Location: [facility] OR;  Service: Orthopedics;  Laterality: Right;   TEE WITHOUT CARDIOVERSION N/A 03/17/2023   Procedure: TRANSESOPHAGEAL ECHOCARDIOGRAM;  Surgeon: Mona Vinie BROCKS, MD;  Location: [facility] INVASIVE CV LAB;  Service: Cardiovascular;  Laterality: N/A;   TENDON LENGTHENING Bilateral    calf   TONSILLECTOMY     Social History   Occupational History   Occupation: DISABLED  Tobacco Use   Smoking status: Former    Current packs/day: 1.00    Average packs/day: 1 pack/day for 40.0 years (40.0 ttl pk-yrs)    Types: Cigarettes   Smokeless tobacco: Never  Vaping Use   Vaping status: Never Used  Substance and Sexual Activity   Alcohol use: No   Drug use: No   Sexual activity: Not on file

## 2024-03-01 ENCOUNTER — Other Ambulatory Visit: Payer: Self-pay

## 2024-03-01 NOTE — Progress Notes (Addendum)
 SDW CALL  Patient is a resident at [name] and [address]. Patient's history and reviewed and preop instructions reviewed with  patient's wife, Troy [name] who stated that she would be at hospital on Friday. I also spoke with Tourist information centre manager at [facility] and reviewed preop instructions . Written preop instructions faxed to Harlene and I requested a return phone call acknowledging receipt of instructions. Harlene also stated she would fax a copy of patients MAR to [phone].   PCP - Comer Baird PIETY  Cardiologist - Cindie Ole DASEN, MDElectrophysiology - Cardiology , Darryle Ned O'Neal,MD   PPM/ICD - denies Device Orders -  Rep Notified -   Chest x-ray - 04/06/23 EKG - 07/22/23 Stress Test - 01/15/16 ECHO - 03/1723 Cardiac Cath - 01/22/16  Sleep Study - denies CPAP -   Fasting Blood Sugar -  Checks Blood Sugar _____ times a day  Blood Thinner Instructions:Jessica from [facility] stated that she spoke with Maurilio Collet PA-C at Dr. Crist office and was told that Mr. Rademaker could continue his Eliquis  and hold the day of surgery. Aspirin  Instructions:na  ERAS Protcol -no PRE-SURGERY Ensure or G2-   COVID TEST- na   Anesthesia review: yes- HX CHF,A fib,HTN,HLD,DM    Surgical Instructions    Your procedure is scheduled on Friday September 12.  Report to [facility] Main Entrance A at 6:00 A.M., then check in with the Admitting office.  Call this number if you have problems the morning of surgery:  [phone]    Remember:  Do not eat after midnight the night before your surgery  You may drink clear liquids until 5:30am the morning of your surgery.   Clear liquids allowed are: Water , Non-Citrus Juices (without pulp), Carbonated Beverages, Clear Tea, Black Coffee ONLY (NO MILK, CREAM OR POWDERED CREAMER of any kind), and Gatorade   Take these medicines the morning of surgery with A SIP OF WATER :               divalproex   (DEPAKOTE )               doxycycline  (VIBRA -TABS)   FOLLOW DR. DUDA'S INSTRUCTIONS REGARDING ELIQUIS .   As of today, STOP taking any Aspirin  (unless otherwise instructed by your surgeon) Aleve, Naproxen, Ibuprofen, Motrin, Advil, Goody's, BC's, all herbal medications, fish oil, and all vitamins.  WHAT DO I DO ABOUT MY DIABETES MEDICATION?  Do not take empagliflozin  ([name] ) 9/11 or 9/12.  THE NIGHT BEFORE SURGERY, do not take bedtime dose of lispro (HUMALOG )  insulin .    THE MORNING OF SURGERY, take 6-7units(1/2 your normal dose) of insulin  degludec (TRESIBA ) insulin .   If your CBG is greater than 220 mg/dL the morning of surgery , you may take  of your sliding scale (correction) dose of insulin  lispro (HUMALOG ) . If your CBG is less than 220, DO NOT take any         insulin  lispro (HUMALOG ).  Check your blood sugar the morning of your surgery when you wake up and every 2 hours until you get to the Short Stay unit.  If your blood sugar is less than 70 mg/dL, you will need to treat for low blood sugar: Do not take insulin . Treat a low blood sugar (less than 70 mg/dL) with  cup of clear juice (cranberry or apple), 4 glucose tablets, OR glucose gel. Recheck blood sugar in 15 minutes after treatment (to make sure it is greater than 70 mg/dL). If your blood sugar  is not greater than 70 mg/dL on recheck, call [phone] for further instructions. Report your blood sugar to the short stay nurse when you get to Short Stay.   [facility] is not responsible for any belongings or valuables. .   Do NOT Smoke (Tobacco/Vaping)  24 hours prior to your procedure  If you use a CPAP at night, you may bring your mask for your overnight stay.   Contacts, glasses, hearing aids, dentures or partials may not be worn into surgery, please bring cases for these belongings     Special instructions:    Oral Hygiene is also important to reduce your risk of infection.  Remember - BRUSH YOUR TEETH THE  MORNING OF SURGERY WITH YOUR REGULAR TOOTHPASTE   Day of Surgery:  Take a shower the day of or night before with antibacterial soap. Wear Clean/Comfortable clothing the morning of surgery Do not apply any deodorants/lotions.   Do not wear jewelry or makeup Do not wear lotions, powders, perfumes/colognes, or deodorant. Do not shave 48 hours prior to surgery.  Men may shave face and neck. Do not bring valuables to the hospital. Do not wear nail polish, gel polish, artificial nails, or any other type of covering on natural nails (fingers and toes) If you have artificial nails or gel coating that need to be removed by a nail salon, please have this removed prior to surgery. Artificial nails or gel coating may interfere with anesthesia's ability to adequately monitor your vital signs. Remember to brush your teeth WITH YOUR REGULAR TOOTHPASTE.     The day of surgery please send a copy of the patient's MAR indicating the last date and time each medication was given.

## 2024-03-01 NOTE — Anesthesia Preprocedure Evaluation (Signed)
 Anesthesia Evaluation  Patient identified by MRN, date of birth, ID band Patient awake    Reviewed: Allergy & Precautions, NPO status , Patient's Chart, lab work & pertinent test results  History of Anesthesia Complications Negative for: history of anesthetic complications  Airway Mallampati: III  TM Distance: >3 FB Neck ROM: Full    Dental  (+) Edentulous Upper, Dental Advisory Given, Poor Dentition 6 teeth on the bottom:   Pulmonary neg shortness of breath, neg sleep apnea, COPD, neg recent URI, Patient abstained from smoking., former smoker   Pulmonary exam normal breath sounds clear to auscultation       Cardiovascular hypertension (metoprolol ), Pt. on home beta blockers (-) angina + Peripheral Vascular Disease and +CHF  (-) Past MI, (-) Cardiac Stents and (-) CABG + dysrhythmias Atrial Fibrillation + Valvular Problems/Murmurs (mild MR, moderate TR)  Rhythm:Regular Rate:Normal  HLD  TTE 04/07/2023: IMPRESSIONS    1. 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 This has been noted on  multiple previous CT scans.. Left ventricular ejection fraction, by  estimation, is 70 to 75%. The left ventricle has hyperdynamic function.  The left ventricle demonstrates regional wall  motion abnormalities (see scoring diagram/findings for description). There  is moderate concentric left ventricular hypertrophy. Left ventricular  diastolic parameters are consistent with Grade II diastolic dysfunction  (pseudonormalization).   2. Right ventricular systolic function is low normal. The right  ventricular size is mildly enlarged. Mildly increased right ventricular  wall thickness. There is severely elevated pulmonary artery systolic  pressure. The estimated right ventricular  systolic pressure is 63.2 mmHg.   3. Left atrial size was moderately dilated.   4. The mitral valve is degenerative. Mild mitral valve regurgitation. No  evidence of mitral  stenosis.   5. Tricuspid valve regurgitation is moderate.   6. The aortic valve is tricuspid. There is thickening of the left  coronary cusp of the aortic valve. Aortic valve regurgitation is trivial.  Aortic valve sclerosis/calcification is present, without any evidence of  aortic stenosis.   7. There is dilatation of the ascending aorta, measuring 38 mm.   8. The inferior vena cava is normal in size with greater than 50%  respiratory variability, suggesting right atrial pressure of 3 mmHg.     Neuro/Psych neg Seizures PSYCHIATRIC DISORDERS (PTSD) Anxiety Depression    Brain cyst TIA (03/2023) Neuromuscular disease (CRPS)    GI/Hepatic ,GERD  ,,(+) Cirrhosis         Endo/Other  diabetes, Type 2, Oral Hypoglycemic Agents, Insulin  Dependent    Renal/GU Renal disease (h/o stones)     Musculoskeletal Osteomyelitis    Abdominal   Peds  Hematology  (+) Blood dyscrasia, anemia Lab Results      Component                Value               Date                      WBC                      13.1 (H)            02/10/2024                HGB                      11.5 (L)  02/10/2024                HCT                      38.8 (L)            02/10/2024                MCV                      74.2 (L)            02/10/2024                PLT                      255                 02/10/2024              Anesthesia Other Findings   Reproductive/Obstetrics                              Anesthesia Physical Anesthesia Plan  ASA: 3  Anesthesia Plan: General   Post-op Pain Management: Tylenol  PO (pre-op)*   Induction: Intravenous  PONV Risk Score and Plan: 2 and Ondansetron , Dexamethasone  and Treatment may vary due to age or medical condition  Airway Management Planned: LMA  Additional Equipment:   Intra-op Plan:   Post-operative Plan: Extubation in OR  Informed Consent: I have reviewed the patients History and Physical, chart, labs  and discussed the procedure including the risks, benefits and alternatives for the proposed anesthesia with the patient or authorized representative who has indicated his/her understanding and acceptance.     Dental advisory given  Plan Discussed with: CRNA and Anesthesiologist  Anesthesia Plan Comments: (Risks of general anesthesia discussed including, but not limited to, sore throat, hoarse voice, chipped/damaged teeth, injury to vocal cords, nausea and vomiting, allergic reactions, lung infection, heart attack, stroke, and death. All questions answered. )         Anesthesia Quick Evaluation

## 2024-03-01 NOTE — Progress Notes (Addendum)
 Surgical Instructions    Your procedure is scheduled on Friday September 12.  Report to [facility] Main Entrance A at 6:00 A.M., then check in with the Admitting office.  Call this number if you have problems the morning of surgery:  [phone]    Remember:  Do not eat after midnight the night before your surgery  You may drink clear liquids until 5:30am the morning of your surgery.   Clear liquids allowed are: Water , Non-Citrus Juices (without pulp), Carbonated Beverages, Clear Tea, Black Coffee ONLY (NO MILK, CREAM OR POWDERED CREAMER of any kind), and Gatorade   Take these medicines the morning of surgery with A SIP OF WATER :               divalproex  (DEPAKOTE )               doxycycline  (VIBRA -TABS)   FOLLOW DR. DUDA'S INSTRUCTIONS REGARDING ELIQUIS .   As of today, STOP taking any Aspirin  (unless otherwise instructed by your surgeon) Aleve, Naproxen, Ibuprofen, Motrin, Advil, Goody's, BC's, all herbal medications, fish oil, and all vitamins.  WHAT DO I DO ABOUT MY DIABETES MEDICATION?              DO NOT take evening dose of glipizide  and DO NOT take glipizide  morning of surgery.  Do not take empagliflozin  ([name] ) 9/11 or 9/12.  THE NIGHT BEFORE SURGERY, do not take bedtime dose of lispro (HUMALOG )  insulin .    THE MORNING OF SURGERY, take 6-7units(1/2 your normal dose) of insulin  degludec (TRESIBA ) insulin .   If your CBG is greater than 220 mg/dL the morning of surgery , you may take  of your sliding scale (correction) dose of insulin  lispro (HUMALOG ) . If your CBG is less than 220, DO NOT take any         insulin  lispro (HUMALOG ).  Check your blood sugar the morning of your surgery when you wake up and every 2 hours until you get to the Short Stay unit.  If your blood sugar is less than 70 mg/dL, you will need to treat for low blood sugar: Do not take insulin . Treat a low blood sugar (less than 70 mg/dL) with  cup of clear juice (cranberry or apple), 4 glucose  tablets, OR glucose gel. Recheck blood sugar in 15 minutes after treatment (to make sure it is greater than 70 mg/dL). If your blood sugar is not greater than 70 mg/dL on recheck, call [phone] for further instructions. Report your blood sugar to the short stay nurse when you get to Short Stay.   [facility] is not responsible for any belongings or valuables. .   Do NOT Smoke (Tobacco/Vaping)  24 hours prior to your procedure  If you use a CPAP at night, you may bring your mask for your overnight stay.   Contacts, glasses, hearing aids, dentures or partials may not be worn into surgery, please bring cases for these belongings     Special instructions:    Oral Hygiene is also important to reduce your risk of infection.  Remember - BRUSH YOUR TEETH THE MORNING OF SURGERY WITH YOUR REGULAR TOOTHPASTE   Day of Surgery:  Take a shower the day of or night before with antibacterial soap. Wear Clean/Comfortable clothing the morning of surgery Do not apply any deodorants/lotions.   Do not wear jewelry or makeup Do not wear lotions, powders, perfumes/colognes, or deodorant. Do not shave 48 hours prior to surgery.  Men may shave face and neck. Do not  bring valuables to the hospital. Do not wear nail polish, gel polish, artificial nails, or any other type of covering on natural nails (fingers and toes) If you have artificial nails or gel coating that need to be removed by a nail salon, please have this removed prior to surgery. Artificial nails or gel coating may interfere with anesthesia's ability to adequately monitor your vital signs. Remember to brush your teeth WITH YOUR REGULAR TOOTHPASTE.     The day of surgery please send a copy of the patient's MAR indicating the last date and time each medication was given.

## 2024-03-01 NOTE — H&P (Signed)
 Troy [name] is an 63 y.o. male.   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 There is concern that he may be kneeling on the residual limb. Just in bed as he is status post above-knee amputation on the left   Past Medical History:  Diagnosis Date   Acute renal failure (HCC)    in setting of NSAID use and orthopedic surgery 2010   Anxiety and depression    Chronic diastolic CHF (congestive heart failure) (HCC)    a. Echo 6/17: severe conc LVH, vigorous EF, EF 65-70%, no dynamic obstruction, no RWMA, Gr 1 DD, mild TR  //  b. 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 LHC 2002: irregs  //  b. LHC in 8/17: no sig CAD, apical DK, hyperdynamic LV, LVEDP 28   History of kidney stones    History of nuclear stress test    a. Nuc 7/17: Overall, intermediate risk nuclear stress test secondary to small size of apical lateral defect and reduced ejection fraction.  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    Kidney calculi    Palpitations    Pneumonia    PTSD (post-traumatic stress disorder)    Tobacco abuse    Transient ischemic attack     Past Surgical History:  Procedure Laterality Date   ABDOMINAL AORTOGRAM W/LOWER EXTREMITY N/A 01/31/2023   Procedure: ABDOMINAL AORTOGRAM W/LOWER EXTREMITY;  Surgeon: Troy Penne Bruckner, MD;   Location: [facility] INVASIVE CV LAB;  Service: Cardiovascular;  Laterality: N/A;   AMPUTATION Left 01/03/2018   Procedure: LEFT MIDFOOT AMPUTATION/REVISION MIDAMPUTAION;  Surgeon: Troy [name] GRADE, MD;  Location: [facility] OR;  Service: Orthopedics;  Laterality: Left;   AMPUTATION Left 01/25/2018   Procedure: LEFT BELOW KNEE AMPUTATION;  Surgeon: Troy Jerona GAILS, MD;  Location: [facility] OR;  Service: Orthopedics;  Laterality: Left;   AMPUTATION Left 01/21/2021   Procedure: LEFT ABOVE KNEE AMPUTATION;  Surgeon: Troy Jerona GAILS, MD;  Location: [facility] OR;  Service: Orthopedics;  Laterality: Left;   AMPUTATION Right 02/09/2023   Procedure: RIGHT TRANSMETATARSAL AMPUTATION;  Surgeon: Troy Jerona GAILS, MD;  Location: [facility] OR;  Service: Orthopedics;  Laterality: Right;   AMPUTATION Right 03/11/2023   Procedure: RIGHT BELOW KNEE AMPUTATION;  Surgeon: Troy Jerona GAILS, MD;  Location: [facility] OR;  Service: Orthopedics;  Laterality: Right;   AMPUTATION TOE Right 07/17/2019   Procedure: AMPUTATION RIGHT FOOT 2ND TOE;  Surgeon: Troy [name] GRADE, MD;  Location: [facility];  Service: Orthopedics;  Laterality: Right;   APPLICATION OF WOUND VAC Left 01/21/2021   Procedure: APPLICATION OF WOUND VAC;  Surgeon: Troy Jerona GAILS, MD;  Location: [facility] OR;  Service: Orthopedics;  Laterality: Left;   APPLICATION OF WOUND VAC Right 02/10/2024   Procedure: APPLICATION, WOUND VAC;  Surgeon: Troy Jerona GAILS, MD;  Location: [facility] OR;  Service: Orthopedics;  Laterality: Right;   BELOW KNEE LEG AMPUTATION Left 01/25/2018   CARDIAC CATHETERIZATION N/A 01/22/2016   Procedure: Left Heart Cath and Coronary Angiography;  Surgeon: Troy M Swazil[place], MD;  Location: [facility] INVASIVE CV LAB;  Service: Cardiovascular;  Laterality: N/A;   CARDIOVERSION N/A 03/17/2023   Procedure: CARDIOVERSION;  Surgeon: Troy Vinie BROCKS, MD;  Location: [facility] INVASIVE CV LAB;  Service: Cardiovascular;  Laterality: N/A;   FOOT AMPUTATION Bilateral    I & D EXTREMITY Left 12/15/2017    Procedure: IRRIGATION AND DEBRIDEMENT LEFT FOOT ULCER;  Surgeon: Troy Lonni GRADE, MD;  Location: [facility] ORS;  Service: Orthopedics;  Laterality: Left;   I & D EXTREMITY Left 07/25/2020   Procedure: LEFT BELOW KNEE AMPUTATION ABSCESS EXCISION AND SKIN GRAFT;  Surgeon: Troy Jerona GAILS, MD;  Location: [facility] OR;  Service: Orthopedics;  Laterality: Left;   I & D EXTREMITY Left 08/22/2020   Procedure: DEBRIDEMENT LEFT BELOW KNEE AMPUTATION AND APPLY KERECIS SKIN GRAFT;  Surgeon: Troy Jerona GAILS, MD;  Location: [facility] OR;  Service: Orthopedics;  Laterality: Left;   LITHOTRIPSY     PERIPHERAL VASCULAR INTERVENTION  01/31/2023   Procedure: PERIPHERAL VASCULAR INTERVENTION;  Surgeon: Troy Penne Lonni, MD;  Location: [facility] INVASIVE CV LAB;  Service: Cardiovascular;;   REVISION AMPUTATION, BELOW THE KNEE Right 02/10/2024   Procedure: REVISION AMPUTATION, BELOW THE KNEE;  Surgeon: Troy Jerona GAILS, MD;  Location: [facility] OR;  Service: Orthopedics;  Laterality: Right;   TEE WITHOUT CARDIOVERSION N/A 03/17/2023   Procedure: TRANSESOPHAGEAL ECHOCARDIOGRAM;  Surgeon: Troy Vinie BROCKS, MD;  Location: [facility] INVASIVE CV LAB;  Service: Cardiovascular;  Laterality: N/A;   TENDON LENGTHENING Bilateral    calf   TONSILLECTOMY      Family History  Problem Relation Age of Onset   Leukemia Mother 27       died   Lung cancer Father 20       died   Heart attack Brother 37   Heart attack Brother 83   Hypertension Brother        X3   Hypertension Sister        X2   Diabetes Sister    Stroke Sister    Diabetes Sister    Other Brother        Set designer accident   Social History:  reports that he has quit smoking. His smoking use included cigarettes. He has a 40 pack-year smoking history. He has never used smokeless tobacco. He reports that he does not drink alcohol and does not use drugs.  Allergies:  Allergies  Allergen Reactions   Hydromorphone  Hcl Er Anaphylaxis and Other (See Comments)    Allergic to DYE in extended-release  tablet, can tolerate other forms of hydromorphone    Nucynta [Tapentadol] Anaphylaxis, Swelling and Other (See Comments)    Throat angioedema   Exalamide Other (See Comments)    Unknown reaction    No medications prior to admission.    No results found for this or any previous visit (from the past 48 hours). No results found.  Review of Systems  All other systems reviewed and are negative.   There were no vitals taken for this visit. Physical Exam  Patient is alert,  oriented,  no adenopathy,  well-dressed,  normal affect,  On examination right residual limb there is dehiscence of  the incision sutures are in place this probes 2 cm deep there is exposed tibia there is no purulence or surrounding erythema Lungs non labored breathing     Assessment/Plan Non healing revised right below knee amputation Plan will be to convert the BKA to the AKA.  The risk and benefits were discussed and he agrees to proceed   Maurilio Deland Collet, PA-C 03/01/2024, 12:58 PM

## 2024-03-02 ENCOUNTER — Encounter (HOSPITAL_COMMUNITY): Payer: Self-pay | Admitting: Orthopedic Surgery

## 2024-03-02 ENCOUNTER — Other Ambulatory Visit: Payer: Self-pay

## 2024-03-02 ENCOUNTER — Inpatient Hospital Stay (HOSPITAL_COMMUNITY): Admitting: Anesthesiology

## 2024-03-02 ENCOUNTER — Encounter (HOSPITAL_COMMUNITY)
Admission: RE | Disposition: A | Discharge status: Skilled Nursing Facility | Payer: Self-pay | Source: Home / Self Care | Attending: Orthopedic Surgery

## 2024-03-02 ENCOUNTER — Encounter (HOSPITAL_COMMUNITY): Admitting: Anesthesiology

## 2024-03-02 ENCOUNTER — Inpatient Hospital Stay (HOSPITAL_COMMUNITY)
Admission: RE | Admit: 2024-03-02 | Discharge: 2024-03-05 | DRG: 464 | Disposition: A | Discharge status: Skilled Nursing Facility | Attending: Orthopedic Surgery | Admitting: Orthopedic Surgery

## 2024-03-02 DIAGNOSIS — L02415 Cutaneous abscess of right lower limb: Secondary | ICD-10-CM | POA: Diagnosis not present

## 2024-03-02 DIAGNOSIS — F4310 Post-traumatic stress disorder, unspecified: Secondary | ICD-10-CM | POA: Diagnosis present

## 2024-03-02 DIAGNOSIS — E785 Hyperlipidemia, unspecified: Secondary | ICD-10-CM | POA: Diagnosis present

## 2024-03-02 DIAGNOSIS — Z87442 Personal history of urinary calculi: Secondary | ICD-10-CM | POA: Diagnosis not present

## 2024-03-02 DIAGNOSIS — I4819 Other persistent atrial fibrillation: Secondary | ICD-10-CM | POA: Diagnosis present

## 2024-03-02 DIAGNOSIS — Z885 Allergy status to narcotic agent status: Secondary | ICD-10-CM | POA: Diagnosis not present

## 2024-03-02 DIAGNOSIS — Y835 Amputation of limb(s) as the cause of abnormal reaction of the patient, or of later complication, without mention of misadventure at the time of the procedure: Secondary | ICD-10-CM | POA: Diagnosis present

## 2024-03-02 DIAGNOSIS — Z7901 Long term (current) use of anticoagulants: Secondary | ICD-10-CM

## 2024-03-02 DIAGNOSIS — I5032 Chronic diastolic (congestive) heart failure: Secondary | ICD-10-CM | POA: Diagnosis present

## 2024-03-02 DIAGNOSIS — E119 Type 2 diabetes mellitus without complications: Secondary | ICD-10-CM | POA: Diagnosis present

## 2024-03-02 DIAGNOSIS — Z806 Family history of leukemia: Secondary | ICD-10-CM | POA: Diagnosis not present

## 2024-03-02 DIAGNOSIS — Z833 Family history of diabetes mellitus: Secondary | ICD-10-CM

## 2024-03-02 DIAGNOSIS — Z87891 Personal history of nicotine dependence: Secondary | ICD-10-CM | POA: Diagnosis not present

## 2024-03-02 DIAGNOSIS — I110 Hypertensive heart disease with heart failure: Secondary | ICD-10-CM

## 2024-03-02 DIAGNOSIS — I5030 Unspecified diastolic (congestive) heart failure: Secondary | ICD-10-CM | POA: Diagnosis not present

## 2024-03-02 DIAGNOSIS — Z8673 Personal history of transient ischemic attack (TIA), and cerebral infarction without residual deficits: Secondary | ICD-10-CM | POA: Diagnosis not present

## 2024-03-02 DIAGNOSIS — T8189XA Other complications of procedures, not elsewhere classified, initial encounter: Secondary | ICD-10-CM | POA: Diagnosis present

## 2024-03-02 DIAGNOSIS — Z823 Family history of stroke: Secondary | ICD-10-CM

## 2024-03-02 DIAGNOSIS — T8781 Dehiscence of amputation stump: Principal | ICD-10-CM | POA: Diagnosis present

## 2024-03-02 DIAGNOSIS — Z89512 Acquired absence of left leg below knee: Secondary | ICD-10-CM

## 2024-03-02 DIAGNOSIS — K7460 Unspecified cirrhosis of liver: Secondary | ICD-10-CM | POA: Diagnosis present

## 2024-03-02 DIAGNOSIS — Z89511 Acquired absence of right leg below knee: Secondary | ICD-10-CM

## 2024-03-02 DIAGNOSIS — Z801 Family history of malignant neoplasm of trachea, bronchus and lung: Secondary | ICD-10-CM

## 2024-03-02 DIAGNOSIS — Z888 Allergy status to other drugs, medicaments and biological substances status: Secondary | ICD-10-CM | POA: Diagnosis not present

## 2024-03-02 DIAGNOSIS — J449 Chronic obstructive pulmonary disease, unspecified: Secondary | ICD-10-CM | POA: Diagnosis present

## 2024-03-02 DIAGNOSIS — Z8249 Family history of ischemic heart disease and other diseases of the circulatory system: Secondary | ICD-10-CM | POA: Diagnosis not present

## 2024-03-02 DIAGNOSIS — S88111A Complete traumatic amputation at level between knee and ankle, right lower leg, initial encounter: Secondary | ICD-10-CM

## 2024-03-02 HISTORY — PX: AMPUTATION: SHX166

## 2024-03-02 HISTORY — PX: APPLICATION OF WOUND VAC: SHX5189

## 2024-03-02 LAB — CBC WITH DIFFERENTIAL/PLATELET
Abs Immature Granulocytes: 0.12 K/uL — ABNORMAL HIGH (ref 0.00–0.07)
Basophils Absolute: 0.1 K/uL (ref 0.0–0.1)
Basophils Relative: 1 %
Eosinophils Absolute: 0.4 K/uL (ref 0.0–0.5)
Eosinophils Relative: 2 %
HCT: 39.1 % (ref 39.0–52.0)
Hemoglobin: 11.2 g/dL — ABNORMAL LOW (ref 13.0–17.0)
Immature Granulocytes: 1 %
Lymphocytes Relative: 20 %
Lymphs Abs: 3.3 K/uL (ref 0.7–4.0)
MCH: 21.2 pg — ABNORMAL LOW (ref 26.0–34.0)
MCHC: 28.6 g/dL — ABNORMAL LOW (ref 30.0–36.0)
MCV: 73.9 fL — ABNORMAL LOW (ref 80.0–100.0)
Monocytes Absolute: 1.1 K/uL — ABNORMAL HIGH (ref 0.1–1.0)
Monocytes Relative: 6 %
Neutro Abs: 11.8 K/uL — ABNORMAL HIGH (ref 1.7–7.7)
Neutrophils Relative %: 70 %
Platelets: 281 K/uL (ref 150–400)
RBC: 5.29 MIL/uL (ref 4.22–5.81)
RDW: 17.5 % — ABNORMAL HIGH (ref 11.5–15.5)
WBC: 16.8 K/uL — ABNORMAL HIGH (ref 4.0–10.5)
nRBC: 0 % (ref 0.0–0.2)

## 2024-03-02 LAB — COMPREHENSIVE METABOLIC PANEL WITH GFR
ALT: 9 U/L (ref 0–44)
AST: 17 U/L (ref 15–41)
Albumin: 3.4 g/dL — ABNORMAL LOW (ref 3.5–5.0)
Alkaline Phosphatase: 67 U/L (ref 38–126)
Anion gap: 12 (ref 5–15)
BUN: 12 mg/dL (ref 8–23)
CO2: 22 mmol/L (ref 22–32)
Calcium: 8.9 mg/dL (ref 8.9–10.3)
Chloride: 105 mmol/L (ref 98–111)
Creatinine, Ser: 0.65 mg/dL (ref 0.61–1.24)
GFR, Estimated: >60 mL/min (ref >60)
Glucose, Bld: 138 mg/dL — ABNORMAL HIGH (ref 70–99)
Potassium: 3.7 mmol/L (ref 3.5–5.1)
Sodium: 139 mmol/L (ref 135–145)
Total Bilirubin: 0.4 mg/dL (ref 0.0–1.2)
Total Protein: 7.4 g/dL (ref 6.5–8.1)

## 2024-03-02 LAB — GLUCOSE, CAPILLARY
Glucose-Capillary: 146 mg/dL — ABNORMAL HIGH (ref 70–99)
Glucose-Capillary: 171 mg/dL — ABNORMAL HIGH (ref 70–99)
Glucose-Capillary: 183 mg/dL — ABNORMAL HIGH (ref 70–99)
Glucose-Capillary: 222 mg/dL — ABNORMAL HIGH (ref 70–99)

## 2024-03-02 LAB — SURGICAL PCR SCREEN
MRSA, PCR: NEGATIVE
Staphylococcus aureus: NEGATIVE

## 2024-03-02 SURGERY — AMPUTATION, ABOVE KNEE
Anesthesia: General | Site: Leg Upper | Laterality: Right

## 2024-03-02 MED ORDER — MORPHINE SULFATE (PF) 2 MG/ML IV SOLN
1.0000 mg | INTRAVENOUS | Status: DC | PRN
  Administered 2024-03-02: 1 mg via INTRAVENOUS
  Filled 2024-03-02 (×2): qty 1

## 2024-03-02 MED ORDER — CHLORHEXIDINE GLUCONATE 0.12 % MT SOLN: 15.0000 mL | Freq: Once | OROMUCOSAL | Status: AC

## 2024-03-02 MED ORDER — ACETAMINOPHEN 325 MG PO TABS: 325.0000 mg | ORAL_TABLET | Freq: Four times a day (QID) | ORAL | Status: DC | PRN

## 2024-03-02 MED ORDER — AMISULPRIDE (ANTIEMETIC) 5 MG/2ML IV SOLN: 10.0000 mg | Freq: Once | INTRAVENOUS | Status: DC | PRN

## 2024-03-02 MED ORDER — INSULIN LISPRO 100 UNIT/ML IJ SOLN: 0.0000 [IU] | INTRAMUSCULAR | Status: DC

## 2024-03-02 MED ORDER — PROPOFOL 10 MG/ML IV BOLUS
INTRAVENOUS | Status: AC
  Filled 2024-03-02: qty 20

## 2024-03-02 MED ORDER — OXYCODONE HCL 5 MG PO TABS
5.0000 mg | ORAL_TABLET | ORAL | Status: DC | PRN
  Administered 2024-03-02: 10 mg via ORAL
  Administered 2024-03-03: 5 mg via ORAL
  Filled 2024-03-02 (×2): qty 2

## 2024-03-02 MED ORDER — DOCUSATE SODIUM 100 MG PO CAPS
100.0000 mg | ORAL_CAPSULE | Freq: Every day | ORAL | Status: DC
  Administered 2024-03-03 – 2024-03-05 (×3): 100 mg via ORAL
  Filled 2024-03-02 (×3): qty 1

## 2024-03-02 MED ORDER — VANCOMYCIN HCL IN DEXTROSE 1-5 GM/200ML-% IV SOLN
1000.0000 mg | INTRAVENOUS | Status: AC
  Administered 2024-03-02: 1000 mg via INTRAVENOUS
  Filled 2024-03-02: qty 200

## 2024-03-02 MED ORDER — ONDANSETRON HCL 4 MG/2ML IJ SOLN: 4.0000 mg | Freq: Four times a day (QID) | INTRAMUSCULAR | Status: DC | PRN

## 2024-03-02 MED ORDER — PROPOFOL 10 MG/ML IV BOLUS
INTRAVENOUS | Status: DC | PRN
  Administered 2024-03-02: 150 mg via INTRAVENOUS

## 2024-03-02 MED ORDER — EMPAGLIFLOZIN 10 MG PO TABS
10.0000 mg | ORAL_TABLET | Freq: Every day | ORAL | Status: DC
  Administered 2024-03-03 – 2024-03-05 (×3): 10 mg via ORAL
  Filled 2024-03-02 (×3): qty 1

## 2024-03-02 MED ORDER — ORAL CARE MOUTH RINSE: 15.0000 mL | Freq: Once | OROMUCOSAL | Status: AC

## 2024-03-02 MED ORDER — INSULIN ASPART 100 UNIT/ML IJ SOLN
0.0000 [IU] | Freq: Three times a day (TID) | INTRAMUSCULAR | Status: DC
  Administered 2024-03-02: 2 [IU] via SUBCUTANEOUS
  Administered 2024-03-03: 3 [IU] via SUBCUTANEOUS
  Administered 2024-03-03 – 2024-03-04 (×3): 1 [IU] via SUBCUTANEOUS
  Administered 2024-03-04: 2 [IU] via SUBCUTANEOUS
  Administered 2024-03-05 (×2): 1 [IU] via SUBCUTANEOUS

## 2024-03-02 MED ORDER — FUROSEMIDE 40 MG PO TABS
40.0000 mg | ORAL_TABLET | Freq: Every day | ORAL | Status: DC
  Administered 2024-03-02 – 2024-03-05 (×4): 40 mg via ORAL
  Filled 2024-03-02 (×5): qty 1

## 2024-03-02 MED ORDER — CHLORHEXIDINE GLUCONATE 0.12 % MT SOLN
OROMUCOSAL | Status: AC
  Administered 2024-03-02: 15 mL via OROMUCOSAL
  Filled 2024-03-02: qty 15

## 2024-03-02 MED ORDER — ROSUVASTATIN CALCIUM 20 MG PO TABS
40.0000 mg | ORAL_TABLET | Freq: Every day | ORAL | Status: DC
Start: 2024-03-03 — End: 2024-03-05
  Administered 2024-03-03 – 2024-03-05 (×3): 40 mg via ORAL
  Filled 2024-03-02: qty 8
  Filled 2024-03-02: qty 2
  Filled 2024-03-02: qty 8

## 2024-03-02 MED ORDER — FENTANYL CITRATE (PF) 250 MCG/5ML IJ SOLN
INTRAMUSCULAR | Status: DC | PRN
  Administered 2024-03-02 (×5): 50 ug via INTRAVENOUS

## 2024-03-02 MED ORDER — OXYCODONE HCL 5 MG PO TABS
10.0000 mg | ORAL_TABLET | ORAL | Status: DC | PRN
  Administered 2024-03-02: 10 mg via ORAL
  Administered 2024-03-03 – 2024-03-05 (×8): 15 mg via ORAL
  Filled 2024-03-02 (×8): qty 3

## 2024-03-02 MED ORDER — FENTANYL CITRATE (PF) 250 MCG/5ML IJ SOLN
INTRAMUSCULAR | Status: AC
  Filled 2024-03-02: qty 5

## 2024-03-02 MED ORDER — METOPROLOL TARTRATE 5 MG/5ML IV SOLN: 2.0000 mg | INTRAVENOUS | Status: DC | PRN

## 2024-03-02 MED ORDER — SUVOREXANT 5 MG PO TABS: 5.0000 mg | ORAL_TABLET | Freq: Every day | ORAL | Status: DC

## 2024-03-02 MED ORDER — OXYCODONE HCL 5 MG PO TABS
ORAL_TABLET | ORAL | Status: AC
  Filled 2024-03-02: qty 1

## 2024-03-02 MED ORDER — LABETALOL HCL 5 MG/ML IV SOLN: 10.0000 mg | INTRAVENOUS | Status: DC | PRN

## 2024-03-02 MED ORDER — ACETAMINOPHEN 500 MG PO TABS
500.0000 mg | ORAL_TABLET | Freq: Once | ORAL | Status: AC
Start: 2024-03-02 — End: 2024-03-02
  Administered 2024-03-02: 500 mg via ORAL
  Filled 2024-03-02: qty 1

## 2024-03-02 MED ORDER — OXYCODONE HCL 5 MG PO TABS: 5.0000 mg | ORAL_TABLET | Freq: Once | ORAL | Status: DC | PRN

## 2024-03-02 MED ORDER — 0.9 % SODIUM CHLORIDE (POUR BTL) OPTIME
TOPICAL | Status: DC | PRN
  Administered 2024-03-02: 1000 mL

## 2024-03-02 MED ORDER — DIVALPROEX SODIUM 250 MG PO DR TAB
250.0000 mg | DELAYED_RELEASE_TABLET | Freq: Two times a day (BID) | ORAL | Status: DC
  Administered 2024-03-02 – 2024-03-05 (×7): 250 mg via ORAL
  Filled 2024-03-02 (×9): qty 1

## 2024-03-02 MED ORDER — POTASSIUM CHLORIDE CRYS ER 20 MEQ PO TBCR: 40.0000 meq | EXTENDED_RELEASE_TABLET | Freq: Every day | ORAL | Status: DC | PRN

## 2024-03-02 MED ORDER — ONDANSETRON HCL 4 MG/2ML IJ SOLN
INTRAMUSCULAR | Status: DC | PRN
  Administered 2024-03-02: 4 mg via INTRAVENOUS

## 2024-03-02 MED ORDER — LACTATED RINGERS IV SOLN
INTRAVENOUS | Status: DC
Start: 2024-03-02 — End: 2024-03-02

## 2024-03-02 MED ORDER — ACETAMINOPHEN 500 MG PO TABS
ORAL_TABLET | ORAL | Status: AC
  Filled 2024-03-02: qty 2

## 2024-03-02 MED ORDER — INSULIN DEGLUDEC 100 UNIT/ML ~~LOC~~ SOPN: 7.0000 [IU] | PEN_INJECTOR | Freq: Every day | SUBCUTANEOUS | Status: DC

## 2024-03-02 MED ORDER — MIDAZOLAM HCL 2 MG/2ML IJ SOLN
INTRAMUSCULAR | Status: AC
  Filled 2024-03-02: qty 2

## 2024-03-02 MED ORDER — DEXAMETHASONE SODIUM PHOSPHATE 10 MG/ML IJ SOLN
INTRAMUSCULAR | Status: DC | PRN
  Administered 2024-03-02: 5 mg via INTRAVENOUS

## 2024-03-02 MED ORDER — VASHE WOUND IRRIGATION OPTIME
TOPICAL | Status: DC | PRN
  Administered 2024-03-02: 34 [oz_av]

## 2024-03-02 MED ORDER — LIDOCAINE 2% (20 MG/ML) 5 ML SYRINGE
INTRAMUSCULAR | Status: DC | PRN
  Administered 2024-03-02: 80 mg via INTRAVENOUS

## 2024-03-02 MED ORDER — HYDRALAZINE HCL 20 MG/ML IJ SOLN: 5.0000 mg | INTRAMUSCULAR | Status: DC | PRN

## 2024-03-02 MED ORDER — HYDROMORPHONE HCL 1 MG/ML IJ SOLN
0.2500 mg | INTRAMUSCULAR | Status: DC | PRN
  Administered 2024-03-02: 0.5 mg via INTRAVENOUS
  Administered 2024-03-02 (×2): 0.25 mg via INTRAVENOUS

## 2024-03-02 MED ORDER — PANTOPRAZOLE SODIUM 40 MG PO TBEC
40.0000 mg | DELAYED_RELEASE_TABLET | Freq: Every day | ORAL | Status: DC
  Administered 2024-03-02 – 2024-03-05 (×4): 40 mg via ORAL
  Filled 2024-03-02 (×4): qty 1

## 2024-03-02 MED ORDER — OXYCODONE HCL 5 MG/5ML PO SOLN: 5.0000 mg | Freq: Once | ORAL | Status: DC | PRN

## 2024-03-02 MED ORDER — PHENYLEPHRINE 80 MCG/ML (10ML) SYRINGE FOR IV PUSH (FOR BLOOD PRESSURE SUPPORT)
PREFILLED_SYRINGE | INTRAVENOUS | Status: DC | PRN
  Administered 2024-03-02: 160 ug via INTRAVENOUS

## 2024-03-02 MED ORDER — ACETAMINOPHEN 500 MG PO TABS
500.0000 mg | ORAL_TABLET | Freq: Three times a day (TID) | ORAL | Status: DC
  Administered 2024-03-03 – 2024-03-05 (×6): 500 mg via ORAL
  Filled 2024-03-02 (×6): qty 1

## 2024-03-02 MED ORDER — CEFAZOLIN SODIUM-DEXTROSE 2-4 GM/100ML-% IV SOLN
2.0000 g | Freq: Three times a day (TID) | INTRAVENOUS | Status: AC
  Administered 2024-03-02 (×2): 2 g via INTRAVENOUS
  Filled 2024-03-02 (×2): qty 100

## 2024-03-02 MED ORDER — PHENYLEPHRINE 80 MCG/ML (10ML) SYRINGE FOR IV PUSH (FOR BLOOD PRESSURE SUPPORT)
PREFILLED_SYRINGE | INTRAVENOUS | Status: AC
  Filled 2024-03-02: qty 10

## 2024-03-02 MED ORDER — PHENOL 1.4 % MT LIQD: 1.0000 | OROMUCOSAL | Status: DC | PRN

## 2024-03-02 MED ORDER — FENTANYL CITRATE (PF) 100 MCG/2ML IJ SOLN
INTRAMUSCULAR | Status: AC
  Filled 2024-03-02: qty 2

## 2024-03-02 MED ORDER — LIDOCAINE 2% (20 MG/ML) 5 ML SYRINGE
INTRAMUSCULAR | Status: AC
  Filled 2024-03-02: qty 5

## 2024-03-02 MED ORDER — FENTANYL CITRATE (PF) 100 MCG/2ML IJ SOLN
25.0000 ug | INTRAMUSCULAR | Status: DC | PRN
  Administered 2024-03-02 (×3): 50 ug via INTRAVENOUS

## 2024-03-02 MED ORDER — CEFAZOLIN SODIUM-DEXTROSE 2-4 GM/100ML-% IV SOLN
2.0000 g | INTRAVENOUS | Status: AC
  Administered 2024-03-02: 2 g via INTRAVENOUS
  Filled 2024-03-02: qty 100

## 2024-03-02 MED ORDER — GLIPIZIDE 5 MG PO TABS
2.5000 mg | ORAL_TABLET | Freq: Two times a day (BID) | ORAL | Status: DC
Start: 2024-03-02 — End: 2024-03-05
  Administered 2024-03-02 – 2024-03-05 (×6): 2.5 mg via ORAL
  Filled 2024-03-02 (×7): qty 1

## 2024-03-02 MED ORDER — PANTOPRAZOLE SODIUM 40 MG PO TBEC
DELAYED_RELEASE_TABLET | ORAL | Status: AC
  Filled 2024-03-02: qty 1

## 2024-03-02 MED ORDER — ACETAMINOPHEN 500 MG PO TABS
1000.0000 mg | ORAL_TABLET | Freq: Four times a day (QID) | ORAL | Status: AC
  Administered 2024-03-02 – 2024-03-03 (×4): 1000 mg via ORAL
  Filled 2024-03-02 (×3): qty 2

## 2024-03-02 MED ORDER — MIDAZOLAM HCL 2 MG/2ML IJ SOLN
INTRAMUSCULAR | Status: DC | PRN
  Administered 2024-03-02: 2 mg via INTRAVENOUS

## 2024-03-02 MED ORDER — INSULIN GLARGINE 100 UNIT/ML ~~LOC~~ SOLN
7.0000 [IU] | Freq: Every day | SUBCUTANEOUS | Status: DC
  Administered 2024-03-02 – 2024-03-05 (×4): 7 [IU] via SUBCUTANEOUS
  Filled 2024-03-02 (×5): qty 0.07

## 2024-03-02 MED ORDER — HYDROMORPHONE HCL 1 MG/ML IJ SOLN
INTRAMUSCULAR | Status: AC
  Filled 2024-03-02: qty 1

## 2024-03-02 MED ORDER — ACETAMINOPHEN 500 MG PO TABS
500.0000 mg | ORAL_TABLET | Freq: Three times a day (TID) | ORAL | Status: DC
Start: 2024-03-02 — End: 2024-03-02

## 2024-03-02 SURGICAL SUPPLY — 34 items
BAG COUNTER SPONGE SURGICOUNT (BAG) IMPLANT
BLADE SAW RECIP 87.9 MT (BLADE) ×2 IMPLANT
BLADE SURG 21 STRL SS (BLADE) ×2 IMPLANT
BNDG COHESIVE 6X5 TAN ST LF (GAUZE/BANDAGES/DRESSINGS) ×2 IMPLANT
CANISTER WOUND CARE 500ML ATS (WOUND CARE) IMPLANT
COVER SURGICAL LIGHT HANDLE (MISCELLANEOUS) ×2 IMPLANT
CUFF TRNQT CYL 34X4.125X (TOURNIQUET CUFF) IMPLANT
DRAPE INCISE IOBAN 66X45 STRL (DRAPES) ×4 IMPLANT
DRAPE U-SHAPE 47X51 STRL (DRAPES) ×2 IMPLANT
DRESSING PREVENA PLUS CUSTOM (GAUZE/BANDAGES/DRESSINGS) ×2 IMPLANT
DRSG VAC PEEL AND PLACE LRG (GAUZE/BANDAGES/DRESSINGS) IMPLANT
DURAPREP 26ML APPLICATOR (WOUND CARE) ×2 IMPLANT
ELECTRODE REM PT RTRN 9FT ADLT (ELECTROSURGICAL) ×2 IMPLANT
GLOVE BIOGEL PI IND STRL 7.5 (GLOVE) ×2 IMPLANT
GLOVE BIOGEL PI IND STRL 9 (GLOVE) ×2 IMPLANT
GLOVE SURG ORTHO 9.0 STRL STRW (GLOVE) ×2 IMPLANT
GLOVE SURG SS PI 6.5 STRL IVOR (GLOVE) ×2 IMPLANT
GOWN STRL REUS W/ TWL LRG LVL3 (GOWN DISPOSABLE) ×2 IMPLANT
GOWN STRL REUS W/ TWL XL LVL3 (GOWN DISPOSABLE) ×4 IMPLANT
GRAFT SKIN WND MICRO 38 (Tissue) IMPLANT
KIT BASIN OR (CUSTOM PROCEDURE TRAY) ×2 IMPLANT
KIT TURNOVER KIT B (KITS) ×2 IMPLANT
MANIFOLD NEPTUNE II (INSTRUMENTS) ×2 IMPLANT
NS IRRIG 1000ML POUR BTL (IV SOLUTION) ×2 IMPLANT
PACK ORTHO EXTREMITY (CUSTOM PROCEDURE TRAY) ×2 IMPLANT
PAD ARMBOARD POSITIONER FOAM (MISCELLANEOUS) ×2 IMPLANT
PREVENA RESTOR ARTHOFORM 46X30 (CANNISTER) ×2 IMPLANT
STAPLER SKIN PROX 35W (STAPLE) IMPLANT
STOCKINETTE IMPERVIOUS LG (DRAPES) IMPLANT
SUT ETHILON 2 0 PSLX (SUTURE) ×4 IMPLANT
SUT SILK 2-0 18XBRD TIE 12 (SUTURE) ×2 IMPLANT
TOWEL GREEN STERILE FF (TOWEL DISPOSABLE) ×2 IMPLANT
TUBE CONNECTING 20X1/4 (TUBING) ×2 IMPLANT
YANKAUER SUCT BULB TIP NO VENT (SUCTIONS) ×2 IMPLANT

## 2024-03-02 NOTE — Transfer of Care (Signed)
 Immediate Anesthesia Transfer of Care Note  Patient: Troy [name]  Procedure(s) Performed: AMPUTATION, ABOVE KNEE (Right: Knee) APPLICATION, WOUND VAC (Right: Leg Upper)  Patient Location: PACU  Anesthesia Type:General  Level of Consciousness: awake, alert , and oriented  Airway & Oxygen Therapy: Patient Spontanous Breathing and Patient connected to face mask oxygen  Post-op Assessment: Report given to RN and Post -op Vital signs reviewed and stable  Post vital signs: Reviewed and stable  Last Vitals:  Vitals Value Taken Time  BP 148/85 03/02/24 09:36  Temp    Pulse 88 03/02/24 09:40  Resp 7 03/02/24 09:40  SpO2 89 % 03/02/24 09:40  Vitals shown include unfiled device data.  Last Pain:  Vitals:   03/02/24 0745  TempSrc:   PainSc: 0-No pain      Patients Stated Pain Goal: 0 (03/02/24 0737)  Complications: No notable events documented.

## 2024-03-02 NOTE — Op Note (Signed)
 03/02/2024  9:27 AM  PATIENT:  Troy [name]    PRE-OPERATIVE DIAGNOSIS:  Dehiscence Right Below Knee Amputation  POST-OPERATIVE DIAGNOSIS:  Same  PROCEDURE:  AMPUTATION, ABOVE KNEE, APPLICATION, WOUND VAC Application Kerecis micro graft 38 cm to cover wound surface area greater than 200 cm.  SURGEON:  Jerona LULLA Sage, MD  PHYSICIAN ASSISTANT:None ANESTHESIA:   General  PREOPERATIVE INDICATIONS:  [name] is a  63 y.o. male with a diagnosis of Dehiscence Right Below Knee Amputation who failed conservative measures and elected for surgical management.    The risks benefits and alternatives were discussed with the patient preoperatively including but not limited to the risks of infection, bleeding, nerve injury, cardiopulmonary complications, the need for revision surgery, among others, and the patient was willing to proceed.  OPERATIVE IMPLANTS:   Implant Name Type Inv. Item Serial No. Manufacturer Lot No. LRB No. Used Action  GRAFT SKIN WND MICRO 38 - [id number] Tissue GRAFT SKIN WND MICRO 38  [employer] [id number] Right 1 Implanted    @ENCIMAGES @  OPERATIVE FINDINGS: Muscle had good color and contractility.  Arteries were calcified.  OPERATIVE PROCEDURE: Patient was brought to the operating room and underwent a general anesthetic.  After adequate levels anesthesia were obtained patient's right lower extremity was prepped using DuraPrep draped into a sterile field a timeout was called.  A fishmouth incision was made just proximal to the patella carried down extra articularly.  A reciprocating saw was used to resect the femur.  The vascular bundle was identified and suture-ligated with 2-0 silk.  The amputation was completed.  Electrocautery was used for hemostasis.  The wound was irrigated with Vashe.  The wound bed was filled with 38 cm of Kerecis micro graft to cover wound surface area greater than 200 cm.  The deep and superficial fascial layers were closed using #1  Vicryl.  The skin was closed using staples.  The peel in place wound VAC sponge was applied this had a good suction fit patient was extubated taken the PACU in stable condition.   DISCHARGE PLANNING:  Antibiotic duration: Continue antibiotics for 24 hours  Weightbearing: Nonweightbearing on the right  Pain medication: Opioid pathway  Dressing care/ Wound VAC: Wound VAC for 1 week  Ambulatory devices: Transfer training  Discharge to: Skilled nursing facility  Follow-up: In the office 1 week post operative.

## 2024-03-02 NOTE — Anesthesia Postprocedure Evaluation (Signed)
 Anesthesia Post Note  Patient: SAKETH DAUBERT  Procedure(s) Performed: AMPUTATION, ABOVE KNEE (Right: Knee) APPLICATION, WOUND VAC (Right: Leg Upper)     Patient location during evaluation: PACU Anesthesia Type: General Level of consciousness: awake Pain management: pain level controlled Vital Signs Assessment: post-procedure vital signs reviewed and stable Respiratory status: spontaneous breathing, nonlabored ventilation and respiratory function stable Cardiovascular status: blood pressure returned to baseline and stable Postop Assessment: no apparent nausea or vomiting Anesthetic complications: no   No notable events documented.  Last Vitals:  Vitals:   03/02/24 1330 03/02/24 1400  BP: 132/81 113/77  Pulse: (!) 119 (!) 115  Resp:    Temp:    SpO2: 94% 97%    Last Pain:  Vitals:   03/02/24 1410  TempSrc:   PainSc: 6                  Delon Aisha Arch

## 2024-03-02 NOTE — Anesthesia Procedure Notes (Signed)
 Procedure Name: LMA Insertion Date/Time: 03/02/2024 8:58 AM  Performed by: Remy Voiles J, CRNAPre-anesthesia Checklist: Patient identified, Emergency Drugs available, Suction available and Patient being monitored Patient Re-evaluated:Patient Re-evaluated prior to induction Oxygen Delivery Method: Circle System Utilized Preoxygenation: Pre-oxygenation with 100% oxygen Induction Type: IV induction Ventilation: Mask ventilation without difficulty LMA: LMA inserted LMA Size: 4.0 Number of attempts: 1 Airway Equipment and Method: Bite block Placement Confirmation: positive ETCO2 Tube secured with: Tape Dental Injury: Teeth and Oropharynx as per pre-operative assessment

## 2024-03-02 NOTE — Progress Notes (Signed)
 Dr. Peggye made aware that the patient last took Tresiba  and Eliquis  yesterday. Dr. Peggye also made aware of today's CBC result. WBC 16.8 and Neutro Abs 11.8. No new orders received.   Dr. Harden made aware that the patient last took Eliquis  yesterday and of today's CBC result. No new orders received.

## 2024-03-02 NOTE — Progress Notes (Addendum)
 [facility]  Continue Eliquis  for CHA2DS2/VASc this is to prevent risk of stroke and he has non valvular a fib.  He is followed by Cardiology.  He experienced atrial flutter and underwent cardioversion in the past.    I will hold Eliquis  if no drainage in the vac the Eliquis  can be restarted post op day 2.     Maurilio Deland Collet PA-C

## 2024-03-02 NOTE — Interval H&P Note (Signed)
 History and Physical Interval Note:  03/02/2024 8:42 AM  Troy [name]  has presented today for surgery, with the diagnosis of Dehiscence Right Below Knee Amputation.  The various methods of treatment have been discussed with the patient and family. After consideration of risks, benefits and other options for treatment, the patient has consented to  Procedure(s): AMPUTATION, ABOVE KNEE (Right) as a surgical intervention.  The patient's history has been reviewed, patient examined, no change in status, stable for surgery.  I have reviewed the patient's chart and labs.  Questions were answered to the patient's satisfaction.     Miaisabella Bacorn V Valarie Farace

## 2024-03-02 NOTE — OR PostOp (Incomplete)
 PACU TO INPATIENT HANDOFF REPORT  Name/Age/Gender Troy [name] 63 y.o. male  Code Status    Code Status Orders  (From admission, onward)           Start     Ordered   03/02/24 1159  Full code  Continuous       Question:  By:  Answer:  Other   03/02/24 1158           Code Status History     Date Active Date Inactive Code Status Order ID Comments User Context   02/10/2024 1004 02/14/2024 1944 Full Code [id number]  Gerome Maurilio CHRISTELLA DEVONNA Inpatient   04/05/2023 9161 04/08/2023 2002 Limited: Do not attempt resuscitation (DNR) -DNR-LIMITED -Do Not Intubate/DNI  [id number]  Verdene Purchase, MD Inpatient   04/02/2023 1429 04/05/2023 0838 Full Code [id number]  Claudene Maximino DELENA, MD ED   03/11/2023 1123 03/24/2023 1948 Full Code [id number]  Harden Jerona GAILS, MD Inpatient   02/05/2023 0104 02/16/2023 1840 Full Code [id number]  Rosan Deward ORN, NP Inpatient   01/31/2023 1513 01/31/2023 2212 Full Code [id number]  Sheree Penne Bruckner, MD Inpatient   08/15/2021 2310 08/28/2021 0011 Full Code [id number]  Barbra Lang PARAS, DO ED   02/12/2021 0037 02/13/2021 2116 Full Code [id number]  Pearlean Tully BRAVO, MD Inpatient   01/21/2021 1430 02/02/2021 2024 Full Code [id number]  Persons, Ronal Dragon, [place] Inpatient   01/25/2018 1532 01/31/2018 1502 Full Code [id number]  Harden Jerona GAILS, MD Inpatient   01/03/2018 1838 01/04/2018 1246 Full Code [id number]  Vernetta Bruckner GRADE, MD Inpatient   12/15/2017 1603 12/16/2017 1337 Full Code [id number]  Vernetta Bruckner GRADE, MD Inpatient   01/22/2016 1105 01/22/2016 1657 Full Code [id number]  [place], Peter M, MD Inpatient      Advance Directive Documentation    Flowsheet Row Most Recent Value  Type of Advance Directive Healthcare Power of Attorney  Pre-existing out of facility DNR order (yellow form or pink MOST form) --  MOST Form in Place? --    Home/SNF/Other {Discharge Destination:18313::Home}  Chief Complaint Dehiscence of amputation stump of right lower extremity  (HCC) [T87.81] Non-healing surgical wound [T81.89XA]  Level of Care/Admitting Diagnosis ED Disposition     None       Medical History Past Medical History:  Diagnosis Date   Acute renal failure (HCC)    in setting of NSAID use and orthopedic surgery 2010   Anxiety and depression    Chronic diastolic CHF (congestive heart failure) (HCC)    a. Echo 6/17: severe conc LVH, vigorous EF, EF 65-70%, no dynamic obstruction, no RWMA, Gr 1 DD, mild TR  //  b. 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 LHC 2002: irregs  //  b. LHC in 8/17: no sig CAD, apical DK, hyperdynamic LV, LVEDP 28   History of kidney stones    History of nuclear stress test    a. Nuc 7/17: Overall, intermediate risk nuclear stress test secondary to small size of apical lateral defect and reduced ejection fraction.  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    Kidney calculi    Palpitations    Pneumonia    PTSD (post-traumatic stress disorder)    Tobacco abuse    Transient ischemic attack     Allergies Allergies  Allergen Reactions   Hydromorphone  Hcl Er Anaphylaxis and Other (See Comments)    Allergic to DYE in extended-release tablet, can tolerate other forms of hydromorphone    Nucynta [Tapentadol] Anaphylaxis, Swelling and Other (See Comments)    Throat angioedema   Exalamide Other (See Comments)    Unknown reaction    IV Location/Drains/Wounds Patient Lines/Drains/Airways Status     Active Line/Drains/Airways     Name Placement date Placement  time Site Days   Peripheral IV 03/02/24 20 G Right;Posterior Hand 03/02/24  0736  Hand  less than 1   Negative Pressure Wound Therapy Knee Right 02/10/24  0804  --  21   Wound / Incision (Open or Dehisced) 04/04/23 Other (Comment) Leg Right post-op staples 04/04/23  --  Leg  333   Wound 03/02/24 0703 Surgical Closed Surgical Incision Leg Left 03/02/24  0703  Leg  less than 1            Labs/Imaging Results for orders placed or performed during the hospital encounter of 03/02/24 (from the past 48 hours)  Surgical pcr screen     Status: None   Collection Time: 03/02/24  7:03 AM   Specimen: Nasal Mucosa; Nasal Swab  Result Value Ref Range   MRSA, PCR NEGATIVE NEGATIVE   Staphylococcus aureus NEGATIVE NEGATIVE    Comment: (NOTE) The Xpert SA Assay (FDA approved for NASAL specimens in patients 54 years of age and older), is one component of a comprehensive surveillance program. It is not intended to diagnose infection nor to guide or monitor treatment. Performed at [facility], [address], [place], [place] [address]   CBC WITH DIFFERENTIAL     Status: Abnormal   Collection Time: 03/02/24  7:12 AM  Result Value Ref Range   WBC 16.8 (H) 4.0 - 10.5 K/uL   RBC 5.29 4.22 - 5.81 MIL/uL   Hemoglobin 11.2 (L) 13.0 - 17.0 g/dL   HCT 60.8 60.9 - 47.9 %   MCV 73.9 (L) 80.0 - 100.0 fL   MCH 21.2 (L) 26.0 - 34.0 pg   MCHC 28.6 (L) 30.0 - 36.0 g/dL   RDW 82.4 (H) 88.4 - 84.4 %   Platelets 281 150 - 400 K/uL   nRBC 0.0 0.0 - 0.2 %   Neutrophils Relative % 70 %   Neutro Abs 11.8 (H) 1.7 - 7.7 K/uL   Lymphocytes Relative 20 %   Lymphs Abs 3.3 0.7 - 4.0 K/uL   Monocytes Relative 6 %   Monocytes Absolute 1.1 (H) 0.1 - 1.0 K/uL   Eosinophils Relative 2 %   Eosinophils Absolute 0.4 0.0 - 0.5 K/uL   Basophils Relative 1 %   Basophils Absolute 0.1 0.0 - 0.1 K/uL   Immature Granulocytes 1 %   Abs Immature Granulocytes 0.12 (H) 0.00 - 0.07 K/uL    Comment: Performed at [facility], [address] [place], [place] [address]  Comprehensive metabolic panel     Status: Abnormal   Collection Time: 03/02/24  7:12 AM  Result Value Ref Range   Sodium  139 135 - 145 mmol/L   Potassium 3.7 3.5 - 5.1 mmol/L   Chloride 105 98 - 111 mmol/L   CO2 22 22 - 32 mmol/L   Glucose, Bld 138 (H) 70 - 99 mg/dL    Comment: Glucose reference range applies only to samples taken after fasting for at least 8 hours.   BUN 12 8 - 23 mg/dL   Creatinine, Ser 9.34 0.61 - 1.24 mg/dL   Calcium  8.9 8.9 - 10.3 mg/dL   Total Protein 7.4 6.5 - 8.1 g/dL   Albumin 3.4 (L) 3.5 - 5.0 g/dL   AST 17 15 - 41 U/L   ALT 9 0 - 44 U/L   Alkaline Phosphatase 67 38 - 126 U/L   Total Bilirubin 0.4 0.0 - 1.2 mg/dL   GFR, Estimated >39 >39 mL/min    Comment: (NOTE) Calculated using the CKD-EPI Creatinine Equation (2021)    Anion gap 12 5 - 15    Comment: Performed at [facility], [address] [place], [place] [address]  Glucose, capillary     Status: Abnormal   Collection Time: 03/02/24  7:20 AM  Result Value Ref Range   Glucose-Capillary 146 (H) 70 - 99 mg/dL    Comment: Glucose reference range applies only to samples taken after fasting for at least 8 hours.  Glucose, capillary     Status: Abnormal   Collection Time: 03/02/24  9:38 AM  Result Value Ref Range   Glucose-Capillary 171 (H) 70 - 99 mg/dL    Comment: Glucose reference range applies only to samples taken after fasting for at least 8 hours.   No results found.  Pending Labs   Vitals/Pain Today's Vitals   03/02/24 1400 03/02/24 1410 03/02/24 1430 03/02/24 1500  BP: 113/77  123/82 131/80  Pulse: (!) 115  (!) 111 (!) 108  Resp:      Temp:      TempSrc:      SpO2: 97%  91% 96%  Weight:      Height:      PainSc:  6       Isolation Precautions @ISOLATION @  Administered Medications Periop Administered Meds from 03/02/2024 0701 to 03/02/2024 1519       Date/Time Order Dose Route Action Action by Comments     03/02/2024 0800 EDT 0.9 % irrigation (POUR BTL) 1,000 mL Irrigation Given Harden Jerona GAILS, MD dispensed to surgical field for PRN use by surgeon    03/02/2024 1441 EDT acetaminophen  (TYLENOL ) tablet 1,000 mg 1,000 mg Oral Given Fidelia Cathers J, RN --    03/02/2024 0745 EDT acetaminophen  (TYLENOL ) tablet 500 mg 500 mg Oral Given Davis, Natalie M, RN --    03/02/2024 0900 EDT ceFAZolin  (ANCEF ) IVPB 2g/100 mL premix 2 g Intravenous Given Kufeji, Omotara J, CRNA --    03/02/2024 0737 EDT chlorhexidine  (PERIDEX ) 0.12 % solution 15 mL 15 mL Mouth/Throat Given Nicholaus Laneta HERO, RN --    03/02/2024 9090 EDT dexamethasone  (DECADRON ) injection 5 mg Intravenous Given Kufeji, Omotara J, CRNA --    03/02/2024 1441 EDT divalproex  (DEPAKOTE ) DR tablet 250 mg 250 mg Oral Given Cyndra Feinberg J, RN --    03/02/2024 1009 EDT fentaNYL  (SUBLIMAZE ) injection 25-50 mcg 50 mcg Intravenous Given Senon Nixon J, RN --    03/02/2024 0957 EDT fentaNYL  (SUBLIMAZE ) injection 25-50 mcg 50 mcg Intravenous Given Xcaret Morad J, RN --    03/02/2024 0948 EDT fentaNYL  (SUBLIMAZE ) injection 25-50 mcg 50 mcg Intravenous Given White,  Sheppard DASEN, RN --    03/02/2024 [phone] EDT fentaNYL  citrate (PF) (SUBLIMAZE ) injection 50 mcg Intravenous Given Kufeji, Omotara J, CRNA --    03/02/2024 0935 EDT fentaNYL  citrate (PF) (SUBLIMAZE ) injection 50 mcg Intravenous Given Kufeji, Omotara J, CRNA --    03/02/2024 0931 EDT fentaNYL  citrate (PF) (SUBLIMAZE ) injection 50 mcg Intravenous Given Kufeji, Omotara J, CRNA --    03/02/2024 0905 EDT fentaNYL  citrate (PF) (SUBLIMAZE ) injection 50 mcg Intravenous Given Kufeji, Omotara J, CRNA --    03/02/2024 0856 EDT fentaNYL  citrate (PF) (SUBLIMAZE ) injection 50 mcg Intravenous Given Kufeji, Omotara J, CRNA --    03/02/2024 1112 EDT HYDROmorphone  (DILAUDID ) injection 0.25-0.5 mg 0.5 mg Intravenous Given Johathan Province J, RN --    03/02/2024 1048 EDT HYDROmorphone  (DILAUDID ) injection 0.25-0.5 mg 0.25 mg  Intravenous Given Marigrace Mccole J, RN --    03/02/2024 1034 EDT HYDROmorphone  (DILAUDID ) injection 0.25-0.5 mg 0.25 mg Intravenous Given Rashaud Ybarbo J, RN --    03/02/2024 0940 EDT lactated ringers  infusion 0  Intravenous Stopped Kufeji, Omotara J, CRNA --    03/02/2024 0844 EDT lactated ringers  infusion -- Intravenous Restarted Kufeji, Omotara J, CRNA --    03/02/2024 0843 EDT lactated ringers  infusion -- Intravenous Paused Kufeji, Omotara J, CRNA Switch to gravity    03/02/2024 0737 EDT lactated ringers  infusion -- Intravenous New Bag/Given Nicholaus Laneta HERO, RN --    03/02/2024 0856 EDT lidocaine  2% (20 mg/mL) 5 mL syringe 80 mg Intravenous Given Kufeji, Omotara J, CRNA --    03/02/2024 0843 EDT midazolam  (VERSED ) injection 2 mg Intravenous Given Kufeji, Omotara J, CRNA --    03/02/2024 0909 EDT ondansetron  (ZOFRAN ) injection 4 mg Intravenous Given Kufeji, Omotara J, CRNA --    03/02/2024 0737 EDT Oral care mouth rinse -- Mouth Rinse See Alternative Nicholaus Laneta HERO, RN --    03/02/2024 1410 EDT oxyCODONE  (Oxy IR/ROXICODONE ) immediate release tablet 5-10 mg 10 mg Oral Given Jedaiah Rathbun J, RN --    03/02/2024 1441 EDT pantoprazole  (PROTONIX ) EC tablet 40 mg 40 mg Oral Given Gilman Olazabal J, RN --    03/02/2024 0859 EDT PHENYLephrine  80 mcg/ml in normal saline Adult IV Push Syringe (For Blood Pressure Support) 160 mcg Intravenous Given Kufeji, Omotara J, CRNA --    03/02/2024 0856 EDT propofol  (DIPRIVAN ) 10 mg/mL bolus/IV push 150 mg Intravenous Given Kufeji, Omotara J, CRNA --    03/02/2024 0745 EDT vancomycin  (VANCOCIN ) IVPB 1000 mg/200 mL premix 1,000 mg Intravenous New Bag/Given Davis, Natalie M, RN --    03/02/2024 0800 EDT Vashe Wound Irrigation Optime 34 oz Irrigation Given Harden Jerona GAILS, MD PRN use       Mobility {Mobility:20148}

## 2024-03-03 LAB — BASIC METABOLIC PANEL WITH GFR
Anion gap: 14 (ref 5–15)
BUN: 16 mg/dL (ref 8–23)
CO2: 24 mmol/L (ref 22–32)
Calcium: 8.7 mg/dL — ABNORMAL LOW (ref 8.9–10.3)
Chloride: 99 mmol/L (ref 98–111)
Creatinine, Ser: 0.66 mg/dL (ref 0.61–1.24)
GFR, Estimated: >60 mL/min (ref >60)
Glucose, Bld: 142 mg/dL — ABNORMAL HIGH (ref 70–99)
Potassium: 4 mmol/L (ref 3.5–5.1)
Sodium: 137 mmol/L (ref 135–145)

## 2024-03-03 LAB — GLUCOSE, CAPILLARY
Glucose-Capillary: 145 mg/dL — ABNORMAL HIGH (ref 70–99)
Glucose-Capillary: 221 mg/dL — ABNORMAL HIGH (ref 70–99)
Glucose-Capillary: 134 mg/dL — ABNORMAL HIGH (ref 70–99)
Glucose-Capillary: 180 mg/dL — ABNORMAL HIGH (ref 70–99)

## 2024-03-03 LAB — CBC
HCT: 31.5 % — ABNORMAL LOW (ref 39.0–52.0)
Hemoglobin: 9.4 g/dL — ABNORMAL LOW (ref 13.0–17.0)
MCH: 21.4 pg — ABNORMAL LOW (ref 26.0–34.0)
MCHC: 29.8 g/dL — ABNORMAL LOW (ref 30.0–36.0)
MCV: 71.8 fL — ABNORMAL LOW (ref 80.0–100.0)
Platelets: 304 K/uL (ref 150–400)
RBC: 4.39 MIL/uL (ref 4.22–5.81)
RDW: 17.4 % — ABNORMAL HIGH (ref 11.5–15.5)
WBC: 21.3 K/uL — ABNORMAL HIGH (ref 4.0–10.5)
nRBC: 0 % (ref 0.0–0.2)

## 2024-03-03 NOTE — Evaluation (Signed)
 Physical Therapy Evaluation Patient Details Name: Troy [name] MRN: [id number] DOB: [birth year] Today's Date: 03/03/2024  History of Present Illness  Troy [name] is a 63 y.o. male who presented 03/02/24 with a diagnosis of R BKA dehiscence who failed conservative measures and elected for surgical management. Pt s/p R AKA with wound vac application 9/12. 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   Clinical Impression  Pt admitted with above diagnosis. PTA, pt required assistance with functional mobility, ADLs, and IADLs. He completes anterior-posterior transfers to manual w/c and can propel himself around facility. Pt has been residing at [facility]. He reports receiving PT 2x/week and has been working on Teacher, early years/pre. Pt currently with functional limitations due to the deficits listed below (see PT Problem List). He completed bed mobility with supervision and heavy reliance on bed rails. Pt declined to transfer d/t pain. His IV dislodged while seated EOB. RN notified and PT held pressure at site to stop the bleeding. RN notified of pt's request for pain medicine. Pt will benefit from acute skilled PT to increase his independence and safety with mobility to allow discharge. Patient will benefit from continued inpatient follow up therapy, <3 hours/day.    If plan is discharge home, recommend the following: A little help with bathing/dressing/bathroom;Assistance with cooking/housework;Assist for transportation;Help with stairs or ramp for entrance;A lot of help with walking and/or transfers   Can travel by private vehicle   No    Equipment Recommendations Hospital bed;Wheelchair (measurements PT);Wheelchair cushion (measurements PT)  Recommendations for Other Services       Functional Status Assessment Patient has had a recent decline in their functional status and/or demonstrates limited ability to make  significant improvements in function in a reasonable and predictable amount of time     Precautions / Restrictions Precautions Precautions: Fall Recall of Precautions/Restrictions: Intact Precaution/Restrictions Comments: Wound Vac RLE Restrictions Weight Bearing Restrictions Per Provider Order: Yes RLE Weight Bearing Per Provider Order: Non weight bearing      Mobility  Bed Mobility Overal bed mobility: Needs Assistance Bed Mobility: Supine to Sit     Supine to sit: HOB elevated, Used rails, Supervision     General bed mobility comments: Pt requested all four bed rails to remain up. HOB elevated to ~30deg. He pulled on R rails to elevate trunk. PT lowered side rail when pt achieved long sitting. He was able to scoot with BUE support to EOB. Upon reaching EOB pt's IV dislodge. RN notifed and held pressure to stop bleeding. Flattened out bed and pt scooted posterior towards HOB. Repositioned to pt's comfort using bed features.    Transfers Overall transfer level: Needs assistance                 General transfer comment: Pt declined to attempt transfer to recliner chair d/t pain, lack of medication delievery by RN, and IV dislodging.    Ambulation/Gait               General Gait Details: NT - pt nonambulatory at baseline  Stairs            Wheelchair Mobility     Tilt Bed    Modified Rankin (Stroke Patients Only)       Balance Overall balance assessment: Needs assistance Sitting-balance support: Bilateral upper extremity supported, Feet supported Sitting balance-Leahy Scale: Fair Sitting balance - Comments: Pt sat in long sitting and EOB with close  supervision. He relies on BUE support.                                     Pertinent Vitals/Pain Pain Assessment Pain Assessment: 0-10 Pain Score: 7  Pain Location: RLE Pain Descriptors / Indicators: Grimacing, Discomfort, Aching Pain Intervention(s): Monitored during session,  Limited activity within patient's tolerance, Patient requesting pain meds-RN notified, Repositioned    Home Living Family/patient expects to be discharged to:: Skilled nursing facility                   Additional Comments: Pt reports he has been recieving PT 2x/week.    Prior Function Prior Level of Function : Needs assist;Patient poor historian/Family not available (Unsure of the accuracy of information obtained on PLOF. Pt somewhat guarded providing quick and simple answers to questions.)             Mobility Comments: Pt reports that he only requires supervision from SNF staff for mobility. Pt reports he uses bed rails to complete bed mobility and will complete A/P scoots to transfer to w/c. Able to propel himself in w/c. Pt states he has a L prosthetic leg, but rarely wears it and cannot recall the last time he stood. Denies fall history in the past 60mo. Has been working on transfer training with PT. ADLs Comments: Pt reports that he only requires set up from SNF staff for ADLs/selfcare. He uses the bedpan for toileting and sponge baths for hygiene. Staff manage all IADLs.     Extremity/Trunk Assessment   Upper Extremity Assessment Upper Extremity Assessment: Defer to OT evaluation    Lower Extremity Assessment Lower Extremity Assessment: RLE deficits/detail RLE Deficits / Details: Pt POD 1 s/p AKA. Limited hip AROM and decrease strength d/t pain. Grossly 3-/5 strength. RLE: Unable to fully assess due to pain RLE Sensation: decreased proprioception RLE Coordination: decreased gross motor    Cervical / Trunk Assessment Cervical / Trunk Assessment: Other exceptions Cervical / Trunk Exceptions: Thoracic Rounding  Communication   Communication Communication: No apparent difficulties    Cognition Arousal: Alert Behavior During Therapy: Flat affect   PT - Cognitive impairments: No family/caregiver present to determine baseline, Orientation   Orientation  impairments: Time (Pt reports he hasn't been keeping up with the date - said it was October 2022. Reoriented.)                     Following commands: Intact       Cueing Cueing Techniques: Verbal cues     General Comments General comments (skin integrity, edema, etc.): Pt's IV became dislodge and he started to bleed from right wrist. Held pressure until bleeding stopped. RN aware.    Exercises Amputee Exercises Gluteal Sets: Supine, Both, AROM, 5 reps Hip Extension: Supine, Right, AROM, 5 reps (Pt pressing into folded towel under distal thigh) Hip ABduction/ADduction: Supine, Right, AROM, 5 reps Straight Leg Raises: Supine, Right, AROM, 5 reps Other Exercises Other Exercises: Provided pt with R AKA HEP handout (Medbridge Access Code: [email or web address]). Reviewed and demonstrated each exercise. Instructed pt to complete 2-3 sets each day.   Assessment/Plan    PT Assessment Patient needs continued PT services  PT Problem List Decreased strength;Decreased range of motion;Decreased activity tolerance;Decreased balance;Decreased mobility       PT Treatment Interventions DME instruction;Functional mobility training;Therapeutic activities;Therapeutic exercise;Balance training;Patient/family education;Wheelchair mobility training    PT Goals (  Current goals can be found in the Care Plan section)  Acute Rehab PT Goals Patient Stated Goal: Have less pain and get stronger PT Goal Formulation: With patient Time For Goal Achievement: 03/17/24 Potential to Achieve Goals: Fair    Frequency Min 2X/week     Co-evaluation               AM-PAC PT 6 Clicks Mobility  Outcome Measure Help needed turning from your back to your side while in a flat bed without using bedrails?: A Little Help needed moving from lying on your back to sitting on the side of a flat bed without using bedrails?: A Little Help needed moving to and from a bed to a chair (including a wheelchair)?: A Lot Help  needed standing up from a chair using your arms (e.g., wheelchair or bedside chair)?: Total Help needed to walk in hospital room?: Total Help needed climbing 3-5 steps with a railing? : Total 6 Click Score: 11    End of Session   Activity Tolerance: Patient limited by pain Patient left: in bed;with call bell/phone within reach;with bed alarm set Nurse Communication: Mobility status;Patient requests pain meds PT Visit Diagnosis: Other abnormalities of gait and mobility (R26.89);Pain Pain - Right/Left: Right Pain - part of body: Leg    Time: 9250-9185 PT Time Calculation (min) (ACUTE ONLY): 25 min   Charges:   PT Evaluation $PT Eval Moderate Complexity: 1 Mod PT Treatments $Therapeutic Exercise: 8-22 mins PT General Charges $$ ACUTE PT VISIT: 1 Visit         Randall SAUNDERS, PT, DPT Acute Rehabilitation Services Office: [phone] Secure Chat Preferred  Delon CHRISTELLA Callander 03/03/2024, 8:58 AM

## 2024-03-03 NOTE — Evaluation (Signed)
 Occupational Therapy Evaluation Patient Details Name: Troy [name] MRN: [id number] DOB: 12-30-1960 Today's Date: 03/03/2024   History of Present Illness   Troy [name] is a 63 y.o. male who presented 03/02/24 with a diagnosis of R BKA dehiscence who failed conservative measures and elected for surgical management. Pt s/p R AKA with wound vac application 9/12. 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     Clinical Impressions Pt reports transferring with supervision using A-P technique in and out of his manual w/c at SNF. He self propels his w/c. Pt states he showers and dresses himself, uses a bed pan for toileting. Pt limited this visit by 8/10 reported pain in his R LE approximately one hour after being given pain med, RN aware. He demonstrated bed mobility with heavy reliance on rail with supervision and fair sitting balance with at least one UE supported on bed. He simulated a lateral scoot along EOB with min assist. Pt requires set up to moderate assistance with ADLs. Declined transfer to chair this visit due to pain. Pt is a long term resident of [facility] and plans to return upon discharge.     If plan is discharge home, recommend the following:   A little help with bathing/dressing/bathroom;A lot of help with walking and/or transfers;Assistance with cooking/housework;Direct supervision/assist for medications management;Direct supervision/assist for financial management;Assist for transportation     Functional Status Assessment   Patient has had a recent decline in their functional status and demonstrates the ability to make significant improvements in function in a reasonable and predictable amount of time.     Equipment Recommendations   None recommended by OT     Recommendations for Other Services         Precautions/Restrictions   Precautions Precautions: Fall Recall of  Precautions/Restrictions: Intact Precaution/Restrictions Comments: Wound Vac RLE Restrictions Weight Bearing Restrictions Per Provider Order: Yes RLE Weight Bearing Per Provider Order: Non weight bearing     Mobility Bed Mobility Overal bed mobility: Needs Assistance Bed Mobility: Rolling, Supine to Sit, Sit to Supine Rolling: Supervision   Supine to sit: HOB elevated, Used rails, Supervision Sit to supine: Supervision   General bed mobility comments: heavy reliance on rail for all bed mobility, assist for lines    Transfers                   General transfer comment: declined A-P to chair, demonstrated lateral scoot with min assist      Balance Overall balance assessment: Needs assistance   Sitting balance-Leahy Scale: Fair Sitting balance - Comments: reliant on at least one hand support and close guard at EOB                                   ADL either performed or assessed with clinical judgement   ADL Overall ADL's : Needs assistance/impaired Eating/Feeding: Set up;Bed level   Grooming: Wash/dry face;Wash/dry hands;Sitting;Supervision/safety   Upper Body Bathing: Minimal assistance;Sitting   Lower Body Bathing: Moderate assistance;Bed level   Upper Body Dressing : Minimal assistance;Sitting   Lower Body Dressing: Moderate assistance;Bed level     Toilet Transfer Details (indicate cue type and reason): used urinal independently           General ADL Comments: pt typically completes dressing, rolling side to side at bed level, limited by pain  Vision Baseline Vision/History: 1 Wears glasses Ability to See in Adequate Light: 0 Adequate Patient Visual Report: No change from baseline       Perception         Praxis         Pertinent Vitals/Pain Pain Assessment Pain Assessment: Faces Pain Score: 8  Pain Location: RLE Pain Descriptors / Indicators: Grimacing, Discomfort, Aching Pain Intervention(s): Premedicated before  session, Limited activity within patient's tolerance, Repositioned     Extremity/Trunk Assessment Upper Extremity Assessment Upper Extremity Assessment: Overall WFL for tasks assessed   Lower Extremity Assessment Lower Extremity Assessment: Defer to PT evaluation RLE Deficits / Details: Pt POD 1 s/p AKA. Limited hip AROM and decrease strength d/t pain. Grossly 3-/5 strength. RLE: Unable to fully assess due to pain RLE Sensation: decreased proprioception RLE Coordination: decreased gross motor   Cervical / Trunk Assessment Cervical / Trunk Assessment: Kyphotic Cervical / Trunk Exceptions: Thoracic Rounding   Communication Communication Communication: No apparent difficulties   Cognition Arousal: Alert Behavior During Therapy: Flat affect Cognition: No family/caregiver present to determine baseline             OT - Cognition Comments: impaired long and short term memory, likely baseline                 Following commands: Intact       Cueing  General Comments   Cueing Techniques: Verbal cues  Pt's IV became dislodge and he started to bleed from right wrist. Held pressure until bleeding stopped. RN aware.   Exercises     Shoulder Instructions      Home Living Family/patient expects to be discharged to:: Skilled nursing facility                                 Additional Comments: Pt reports he has been recieving PT 2x/week.      Prior Functioning/Environment Prior Level of Function : Patient poor historian/Family not available;Needs assist             Mobility Comments: A-P transfers to w/c with supervision, self propels manual w/c ADLs Comments: Pt reports that he only requires set up from SNF staff for ADLs/selfcare. He uses the bedpan for toileting and sponge baths for hygiene. Staff manage all IADLs.    OT Problem List: Impaired balance (sitting and/or standing);Pain;Decreased activity tolerance;Decreased knowledge of use of DME  or AE;Obesity   OT Treatment/Interventions: Self-care/ADL training;DME and/or AE instruction;Therapeutic activities;Patient/family education;Balance training      OT Goals(Current goals can be found in the care plan section)   Acute Rehab OT Goals OT Goal Formulation: With patient Time For Goal Achievement: 03/17/24 Potential to Achieve Goals: Good   OT Frequency:  Min 2X/week    Co-evaluation              AM-PAC OT 6 Clicks Daily Activity     Outcome Measure Help from another person eating meals?: None Help from another person taking care of personal grooming?: A Little Help from another person toileting, which includes using toliet, bedpan, or urinal?: A Little Help from another person bathing (including washing, rinsing, drying)?: A Little Help from another person to put on and taking off regular upper body clothing?: A Little Help from another person to put on and taking off regular lower body clothing?: A Little 6 Click Score: 19   End of Session Nurse Communication: Other (comment) (aware pt's  pain remains 8/10)  Activity Tolerance: Patient limited by pain Patient left: in bed;with call bell/phone within reach;with bed alarm set  OT Visit Diagnosis: Muscle weakness (generalized) (M62.81);Other symptoms and signs involving cognitive function;Pain Pain - Right/Left: Right Pain - part of body: Leg                Time: 1018-1040 OT Time Calculation (min): 22 min Charges:  OT General Charges $OT Visit: 1 Visit OT Evaluation $OT Eval Moderate Complexity: 1 Mod  Mliss HERO, OTR/L Acute Rehabilitation Services Office: [phone]   Troy [name] 03/03/2024, 10:47 AM

## 2024-03-03 NOTE — Plan of Care (Signed)
  Problem: Education: Goal: Knowledge of the prescribed therapeutic regimen will improve Outcome: Progressing Goal: Ability to verbalize activity precautions or restrictions will improve Outcome: Progressing Goal: Understanding of discharge needs will improve Outcome: Progressing   Problem: Activity: Goal: Ability to perform//tolerate increased activity and mobilize with assistive devices will improve Outcome: Progressing   Problem: Clinical Measurements: Goal: Postoperative complications will be avoided or minimized Outcome: Progressing   Problem: Self-Care: Goal: Ability to meet self-care needs will improve Outcome: Progressing   Problem: Self-Concept: Goal: Ability to maintain and perform role responsibilities to the fullest extent possible will improve Outcome: Progressing   Problem: Pain Management: Goal: Pain level will decrease with appropriate interventions Outcome: Progressing   Problem: Skin Integrity: Goal: Demonstration of wound healing without infection will improve Outcome: Progressing   Problem: Education: Goal: Knowledge of General Education information will improve Description: Including pain rating scale, medication(s)/side effects and non-pharmacologic comfort measures Outcome: Progressing   Problem: Health Behavior/Discharge Planning: Goal: Ability to manage health-related needs will improve Outcome: Progressing   Problem: Clinical Measurements: Goal: Ability to maintain clinical measurements within normal limits will improve Outcome: Progressing Goal: Will remain free from infection Outcome: Progressing Goal: Diagnostic test results will improve Outcome: Progressing Goal: Respiratory complications will improve Outcome: Progressing Goal: Cardiovascular complication will be avoided Outcome: Progressing   Problem: Activity: Goal: Risk for activity intolerance will decrease Outcome: Progressing   Problem: Nutrition: Goal: Adequate nutrition  will be maintained Outcome: Progressing   Problem: Coping: Goal: Level of anxiety will decrease Outcome: Progressing   Problem: Elimination: Goal: Will not experience complications related to bowel motility Outcome: Progressing Goal: Will not experience complications related to urinary retention Outcome: Progressing   Problem: Pain Managment: Goal: General experience of comfort will improve and/or be controlled Outcome: Progressing   Problem: Safety: Goal: Ability to remain free from injury will improve Outcome: Progressing   Problem: Skin Integrity: Goal: Risk for impaired skin integrity will decrease Outcome: Progressing   Problem: Education: Goal: Ability to describe self-care measures that may prevent or decrease complications (Diabetes Survival Skills Education) will improve Outcome: Progressing Goal: Individualized Educational Video(s) Outcome: Progressing   Problem: Coping: Goal: Ability to adjust to condition or change in health will improve Outcome: Progressing   Problem: Fluid Volume: Goal: Ability to maintain a balanced intake and output will improve Outcome: Progressing   Problem: Health Behavior/Discharge Planning: Goal: Ability to identify and utilize available resources and services will improve Outcome: Progressing Goal: Ability to manage health-related needs will improve Outcome: Progressing   Problem: Metabolic: Goal: Ability to maintain appropriate glucose levels will improve Outcome: Progressing   Problem: Nutritional: Goal: Maintenance of adequate nutrition will improve Outcome: Progressing Goal: Progress toward achieving an optimal weight will improve Outcome: Progressing   Problem: Skin Integrity: Goal: Risk for impaired skin integrity will decrease Outcome: Progressing   Problem: Tissue Perfusion: Goal: Adequacy of tissue perfusion will improve Outcome: Progressing

## 2024-03-03 NOTE — Progress Notes (Signed)
 Patient ID: Troy [name], male   DOB: 01-31-1961, 63 y.o.   MRN: [id number] Patient is postoperative day 1 above-knee amputation.  There is 25 cc in the wound VAC canister with a good suction fit.  Plan for discharge to skilled nursing.

## 2024-03-04 LAB — BASIC METABOLIC PANEL WITH GFR
Anion gap: 14 (ref 5–15)
BUN: 16 mg/dL (ref 8–23)
CO2: 26 mmol/L (ref 22–32)
Calcium: 8.8 mg/dL — ABNORMAL LOW (ref 8.9–10.3)
Chloride: 98 mmol/L (ref 98–111)
Creatinine, Ser: 0.72 mg/dL (ref 0.61–1.24)
GFR, Estimated: >60 mL/min (ref >60)
Glucose, Bld: 137 mg/dL — ABNORMAL HIGH (ref 70–99)
Potassium: 3.5 mmol/L (ref 3.5–5.1)
Sodium: 138 mmol/L (ref 135–145)

## 2024-03-04 LAB — CBC
HCT: 27.4 % — ABNORMAL LOW (ref 39.0–52.0)
Hemoglobin: 8.3 g/dL — ABNORMAL LOW (ref 13.0–17.0)
MCH: 21.7 pg — ABNORMAL LOW (ref 26.0–34.0)
MCHC: 30.3 g/dL (ref 30.0–36.0)
MCV: 71.5 fL — ABNORMAL LOW (ref 80.0–100.0)
Platelets: 222 K/uL (ref 150–400)
RBC: 3.83 MIL/uL — ABNORMAL LOW (ref 4.22–5.81)
RDW: 17.5 % — ABNORMAL HIGH (ref 11.5–15.5)
WBC: 20 K/uL — ABNORMAL HIGH (ref 4.0–10.5)
nRBC: 0 % (ref 0.0–0.2)

## 2024-03-04 LAB — GLUCOSE, CAPILLARY
Glucose-Capillary: 153 mg/dL — ABNORMAL HIGH (ref 70–99)
Glucose-Capillary: 133 mg/dL — ABNORMAL HIGH (ref 70–99)
Glucose-Capillary: 135 mg/dL — ABNORMAL HIGH (ref 70–99)
Glucose-Capillary: 153 mg/dL — ABNORMAL HIGH (ref 70–99)

## 2024-03-04 MED ORDER — APIXABAN 5 MG PO TABS
5.0000 mg | ORAL_TABLET | Freq: Two times a day (BID) | ORAL | Status: DC
  Administered 2024-03-04 – 2024-03-05 (×3): 5 mg via ORAL
  Filled 2024-03-04 (×3): qty 1

## 2024-03-04 NOTE — Plan of Care (Incomplete)
  Problem: Education: Goal: Knowledge of the prescribed therapeutic regimen will improve Outcome: Progressing Goal: Ability to verbalize activity precautions or restrictions will improve Outcome: Progressing Goal: Understanding of discharge needs will improve Outcome: Progressing   Problem: Activity: Goal: Ability to perform//tolerate increased activity and mobilize with assistive devices will improve Outcome: Progressing   Problem: Clinical Measurements: Goal: Postoperative complications will be avoided or minimized Outcome: Progressing   Problem: Self-Care: Goal: Ability to meet self-care needs will improve Outcome: Progressing   Problem: Self-Concept: Goal: Ability to maintain and perform role responsibilities to the fullest extent possible will improve Outcome: Progressing   Problem: Pain Management: Goal: Pain level will decrease with appropriate interventions Outcome: Progressing   Problem: Skin Integrity: Goal: Demonstration of wound healing without infection will improve Outcome: Progressing   Problem: Education: Goal: Knowledge of General Education information will improve Description: Including pain rating scale, medication(s)/side effects and non-pharmacologic comfort measures Outcome: Progressing   Problem: Health Behavior/Discharge Planning: Goal: Ability to manage health-related needs will improve Outcome: Progressing   Problem: Clinical Measurements: Goal: Ability to maintain clinical measurements within normal limits will improve Outcome: Progressing Goal: Will remain free from infection Outcome: Progressing Goal: Diagnostic test results will improve Outcome: Progressing Goal: Respiratory complications will improve Outcome: Progressing Goal: Cardiovascular complication will be avoided Outcome: Progressing   Problem: Activity: Goal: Risk for activity intolerance will decrease Outcome: Progressing   Problem: Nutrition: Goal: Adequate nutrition  will be maintained Outcome: Progressing   Problem: Coping: Goal: Level of anxiety will decrease Outcome: Progressing   Problem: Elimination: Goal: Will not experience complications related to bowel motility Outcome: Progressing Goal: Will not experience complications related to urinary retention Outcome: Progressing   Problem: Pain Managment: Goal: General experience of comfort will improve and/or be controlled Outcome: Progressing   Problem: Safety: Goal: Ability to remain free from injury will improve Outcome: Progressing   Problem: Skin Integrity: Goal: Risk for impaired skin integrity will decrease Outcome: Progressing   Problem: Education: Goal: Ability to describe self-care measures that may prevent or decrease complications (Diabetes Survival Skills Education) will improve Outcome: Progressing Goal: Individualized Educational Video(s) Outcome: Progressing   Problem: Coping: Goal: Ability to adjust to condition or change in health will improve Outcome: Progressing   Problem: Fluid Volume: Goal: Ability to maintain a balanced intake and output will improve Outcome: Progressing   Problem: Health Behavior/Discharge Planning: Goal: Ability to identify and utilize available resources and services will improve Outcome: Progressing Goal: Ability to manage health-related needs will improve Outcome: Progressing   Problem: Metabolic: Goal: Ability to maintain appropriate glucose levels will improve Outcome: Progressing   Problem: Nutritional: Goal: Maintenance of adequate nutrition will improve Outcome: Progressing Goal: Progress toward achieving an optimal weight will improve Outcome: Progressing   Problem: Skin Integrity: Goal: Risk for impaired skin integrity will decrease Outcome: Progressing   Problem: Tissue Perfusion: Goal: Adequacy of tissue perfusion will improve Outcome: Progressing

## 2024-03-04 NOTE — TOC Initial Note (Signed)
 Transition of Care [facility]) - Initial/Assessment Note    Patient Details  Name: Troy [name] MRN: [id number] Date of Birth: 30-Mar-1961  Transition of Care [facility]) CM/SW Contact:    Troy LOISE Louder, LCSW Phone Number: 03/04/2024, 12:56 PM  Clinical Narrative:      CSW met with patient at bedside. CSW introduced self and explained role. Patient confirmed he arrived from [facility] and expects to return once stable.   TOC will continue to follow and assist with discharge planning.  Troy [name], MSW, LCSW Clinical Social Worker                 Expected Discharge Plan: Skilled Nursing Facility Barriers to Discharge: Continued Medical Work up   Patient Goals and CMS Choice            Expected Discharge Plan and Services In-house Referral: Clinical Social Work     Living arrangements for the past 2 months: Skilled Nursing Facility                                      Prior Living Arrangements/Services Living arrangements for the past 2 months: Skilled Nursing Facility Lives with:: Self, Facility Resident Patient language and need for interpreter reviewed:: No        Need for Family Participation in Patient Care: Yes (Comment) Care giver support system in place?: Yes (comment)   Criminal Activity/Legal Involvement Pertinent to Current Situation/Hospitalization: No - Comment as needed  Activities of Daily Living   ADL Screening (condition at time of admission) Independently performs ADLs?: No Does the patient have a NEW difficulty with bathing/dressing/toileting/self-feeding that is expected to last >3 days?: No Does the patient have a NEW difficulty with getting in/out of bed, walking, or climbing stairs that is expected to last >3 days?: No Does the patient have a NEW difficulty with communication that is expected to last >3 days?: No Is the patient deaf or have difficulty hearing?: No Does the patient have difficulty seeing, even when wearing  glasses/contacts?: No Does the patient have difficulty concentrating, remembering, or making decisions?: No  Permission Sought/Granted Permission sought to share information with : Family Supports Permission granted to share information with : Yes, Verbal Permission Granted  Share Information with NAME: Troy [name]  Permission granted to share info w AGENCY: [facility]  Permission granted to share info w Relationship: spouse  Permission granted to share info w Contact Information: [phone]  Emotional Assessment Appearance:: Appears stated age   Affect (typically observed): Accepting, Pleasant Orientation: : Oriented to Situation, Oriented to  Time, Oriented to Self, Oriented to Place Alcohol / Substance Use: Not Applicable Psych Involvement: No (comment)  Admission diagnosis:  Dehiscence of amputation stump of right lower extremity (HCC) [T87.81] Non-healing surgical wound [T81.89XA] Patient Active Problem List   Diagnosis Date Noted   Non-healing surgical wound 03/02/2024   Non-healing wound of amputation stump (HCC) 02/10/2024   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   Ventricular aneurysm 04/08/2023   (HFpEF) heart failure with preserved ejection fraction (HCC) 04/02/2023   Lactic acidosis 04/02/2023   Acute respiratory failure with hypoxia (HCC) 04/02/2023   Leukocytosis 04/02/2023   Uncontrolled type 2 diabetes mellitus with hyperglycemia, with long-term current use of insulin  (HCC) 04/02/2023   Hypochromic anemia 04/02/2023   PVD (peripheral vascular disease) (HCC) 04/02/2023   Thrombocytosis 04/02/2023   Respiratory distress 03/21/2023   Osteomyelitis of ankle or  foot, acute, right (HCC) 03/11/2023   Abscess of right lower leg 03/11/2023   Below-knee amputation of right lower extremity (HCC) 03/11/2023   Chronic respiratory failure with hypoxia (HCC) 02/12/2023   Pulmonary infiltrates 02/12/2023   Acute on chronic heart failure with preserved ejection fraction (HFpEF) (HCC)  02/12/2023   Chronic osteomyelitis of right foot with draining sinus (HCC) 02/07/2023   Paroxysmal atrial flutter (HCC) 02/05/2023   Hypotension 02/05/2023   Atrial fibrillation with RVR (HCC) 02/04/2023   Atrial flutter (HCC) 02/04/2023   Lower limb ischemia: ??? 08/22/2021   Diabetic acidosis without coma (HCC)    Hypokalemia    Hypomagnesemia    Hyponatremia    Multifocal pneumonia    Parapneumonic effusion 08/18/2021   Bowel Ileus (HCC) 08/18/2021   ?? 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   Ketoacidosis due to type 2 diabetes mellitus (HCC) 08/15/2021   COVID-19 virus infection 08/15/2021   S/P AKA (above knee amputation), left (HCC) 02/12/2021   Amputation of right great toe and 2nd Toe 02/12/2021   Sepsis due to left-sided neck cellulitis/MRSA 02/12/2021   Hypotensive episode    Elevated troponin    SIRS (systemic inflammatory response syndrome) (HCC) 02/11/2021   Abscess of bursa of left ankle 01/21/2021   Dehiscence of amputation stump (HCC)    Ischemia of left BKA site (HCC)    Abscess of leg without foot, left    Osteomyelitis of second toe of right foot (HCC) 07/12/2019   Below knee amputation status, left 01/25/2018   Dehiscence of amputation stump of right lower extremity (HCC)    Status post left foot surgery 12/15/2017   GERD (gastroesophageal reflux disease) 09/26/2017   Hyperlipidemia associated with type 2 diabetes mellitus (HCC) 07/08/2017   Snoring 06/07/2016   Chest tube in place 01/22/2016   Abnormal nuclear stress test 01/22/2016   Pain, chronic due to trauma 07/04/2012   Complex regional pain syndrome I of lower limb 07/04/2012   COPD (chronic obstructive pulmonary disease) (HCC) 01/27/2011   Pre-operative cardiovascular examination 01/27/2011   Nonspecific abnormal electrocardiogram (ECG) (EKG) 01/27/2011   Murmur 01/27/2011   DM2 (diabetes mellitus, type 2) (HCC)    HTN (hypertension)    HLD (hyperlipidemia)    Tobacco abuse    PCP:  Baird Comer GAILS, NP Pharmacy:   [facility] [phone] - [place], [place] - [address] [place] [address] Phone: [phone] Fax: [phone]  [facility] - [place], [place] - [address] [place] [address] [place] [address] [place] [address] Phone: [phone] Fax: [phone]     Social Drivers of Health (SDOH) Social History: SDOH Screenings   Food Insecurity: No Food Insecurity (03/02/2024)  Housing: Low Risk  (03/02/2024)  Transportation Needs: No Transportation Needs (03/02/2024)  Utilities: Not At Risk (03/02/2024)  Financial Resource Strain: Low Risk  (04/09/2021)   Received from [facility]  Physical Activity: Insufficiently Active (04/09/2021)   Received from [facility]  Social Connections: Unknown (10/22/2021)   Received from [facility]  Stress: No Stress Concern Present (04/09/2021)   Received from [facility]  Tobacco Use: Medium Risk (03/02/2024)   SDOH Interventions:     Readmission Risk Interventions    08/25/2021   12:46 PM  Readmission Risk Prevention Plan  Transportation Screening Complete  Medication Review (RN Care Manager) Complete  PCP or Specialist appointment within 3-5 days of discharge Complete  HRI or Home Care Consult Complete  SW Recovery Care/Counseling Consult Complete  Palliative  Care Screening Not Applicable  Skilled Nursing Facility Complete

## 2024-03-04 NOTE — Progress Notes (Signed)
 Patient ID: Troy [name], male   DOB: 11/28/1960, 63 y.o.   MRN: [id number] Patient is postoperative day 2 above-knee amputation. Remain with wound VAC canister with a good suction fit. Pain controlled.

## 2024-03-05 ENCOUNTER — Encounter (HOSPITAL_COMMUNITY): Payer: Self-pay | Admitting: Orthopedic Surgery

## 2024-03-05 LAB — GLUCOSE, CAPILLARY
Glucose-Capillary: 138 mg/dL — ABNORMAL HIGH (ref 70–99)
Glucose-Capillary: 136 mg/dL — ABNORMAL HIGH (ref 70–99)

## 2024-03-05 LAB — BASIC METABOLIC PANEL WITH GFR
Anion gap: 17 — ABNORMAL HIGH (ref 5–15)
BUN: 15 mg/dL (ref 8–23)
CO2: 24 mmol/L (ref 22–32)
Calcium: 8.6 mg/dL — ABNORMAL LOW (ref 8.9–10.3)
Chloride: 96 mmol/L — ABNORMAL LOW (ref 98–111)
Creatinine, Ser: 0.7 mg/dL (ref 0.61–1.24)
GFR, Estimated: >60 mL/min (ref >60)
Glucose, Bld: 122 mg/dL — ABNORMAL HIGH (ref 70–99)
Potassium: 3.5 mmol/L (ref 3.5–5.1)
Sodium: 137 mmol/L (ref 135–145)

## 2024-03-05 MED ORDER — OXYCODONE-ACETAMINOPHEN 5-325 MG PO TABS: 1.0000 | ORAL_TABLET | ORAL | 0 refills | Status: AC | PRN

## 2024-03-05 NOTE — Progress Notes (Signed)
 Report called to [facility] and given to [place], LPN.  All questions answered.

## 2024-03-05 NOTE — TOC Progression Note (Signed)
 Transition of Care [facility]) - Progression Note    Patient Details  Name: Troy [name] MRN: [id number] Date of Birth: 14-May-1961  Transition of Care [facility]) CM/SW Contact  Bridget Cordella Simmonds, LCSW Phone Number: 03/05/2024, 8:55 AM  Clinical Narrative:   CSW confirmed with [facility] that pt can return. SNF auth request submitted in [place].     Expected Discharge Plan: Skilled Nursing Facility Barriers to Discharge: Continued Medical Work up               Expected Discharge Plan and Services In-house Referral: Clinical Social Work     Living arrangements for the past 2 months: Skilled Nursing Facility Expected Discharge Date: 03/05/24                                     Social Drivers of Health (SDOH) Interventions SDOH Screenings   Food Insecurity: No Food Insecurity (03/02/2024)  Housing: Low Risk  (03/02/2024)  Transportation Needs: No Transportation Needs (03/02/2024)  Utilities: Not At Risk (03/02/2024)  Financial Resource Strain: Low Risk  (04/09/2021)   Received from [facility]  Physical Activity: Insufficiently Active (04/09/2021)   Received from [facility]  Social Connections: Unknown (10/22/2021)   Received from [facility]  Stress: No Stress Concern Present (04/09/2021)   Received from [facility]  Tobacco Use: Medium Risk (03/02/2024)    Readmission Risk Interventions    08/25/2021   12:46 PM  Readmission Risk Prevention Plan  Transportation Screening Complete  Medication Review (RN Care Manager) Complete  PCP or Specialist appointment within 3-5 days of discharge Complete  HRI or Home Care Consult Complete  SW Recovery Care/Counseling Consult Complete  Palliative Care Screening Not Applicable  Skilled Nursing Facility Complete

## 2024-03-05 NOTE — Progress Notes (Signed)
  Progress Note   Date: 03/05/2024  Patient Name: Troy [name]        MRN#: [id number]  Clarification of diagnosis:  The patient has grade II diastolic CHF, which is supported by his TTE dated 04/07/2023.

## 2024-03-05 NOTE — TOC Transition Note (Signed)
 Transition of Care [facility]) - Discharge Note   Patient Details  Name: Troy [name] MRN: [id number] Date of Birth: Nov 05, 1960  Transition of Care [facility]) CM/SW Contact:  Bridget Cordella Simmonds, LCSW Phone Number: 03/05/2024, 11:49 AM   Clinical Narrative:   Pt discharging to [place], room 802A, RN report to [phone]  .  PTAR called [phone].    Final next level of care: Skilled Nursing Facility Barriers to Discharge: Barriers Resolved   Patient Goals and CMS Choice            Discharge Placement              Patient chooses bed at: [facility] Patient to be transferred to facility by: ptar Name of family member notified: wife [name] Patient and family notified of of transfer: 03/05/24  Discharge Plan and Services Additional resources added to the After Visit Summary for   In-house Referral: Clinical Social Work                                   Social Drivers of Health (SDOH) Interventions SDOH Screenings   Food Insecurity: No Food Insecurity (03/02/2024)  Housing: Low Risk  (03/02/2024)  Transportation Needs: No Transportation Needs (03/02/2024)  Utilities: Not At Risk (03/02/2024)  Financial Resource Strain: Low Risk  (04/09/2021)   Received from [facility]  Physical Activity: Insufficiently Active (04/09/2021)   Received from [facility]  Social Connections: Unknown (10/22/2021)   Received from [facility]  Stress: No Stress Concern Present (04/09/2021)   Received from [facility]  Tobacco Use: Medium Risk (03/02/2024)     Readmission Risk Interventions    08/25/2021   12:46 PM  Readmission Risk Prevention Plan  Transportation Screening Complete  Medication Review (RN Care Manager) Complete  PCP or Specialist appointment within 3-5 days of discharge Complete  HRI or Home Care Consult Complete  SW Recovery Care/Counseling Consult Complete  Palliative Care Screening Not Applicable  Skilled Nursing Facility Complete

## 2024-03-05 NOTE — Progress Notes (Signed)
 Patient ID: Troy [name], male   DOB: 1961/04/05, 63 y.o.   MRN: [id number] Patient alert this morning.  There is a good suction fit in the wound VAC canister with no additional drainage.  Plan for discharge with the Prevena plus portable wound VAC pump to skilled nursing.  Prescription on the chart for pain medicine.

## 2024-03-05 NOTE — Progress Notes (Signed)
 Patient wound VAC switched to portable VAC with Charge, RN to verify

## 2024-03-05 NOTE — Care Management Important Message (Signed)
 Important Message  Patient Details  Name: Troy [name] MRN: [id number] Date of Birth: Jul 17, 1960   Important Message Given:  Yes - Medicare IM     Jon Cruel 03/05/2024, 11:54 AM

## 2024-03-05 NOTE — Discharge Summary (Signed)
 Physician Discharge Summary  Patient ID: Troy [name] MRN: [id number] DOB/AGE: [birth year] 64 y.o.  Admit date: 03/02/2024 Discharge date: 03/05/2024  Admission Diagnoses:  Principal Problem:   Non-healing surgical wound   Discharge Diagnoses:  Same  Past Medical History:  Diagnosis Date   Acute renal failure (HCC)    in setting of NSAID use and orthopedic surgery 2010   Anxiety and depression    Chronic diastolic CHF (congestive heart failure) (HCC)    a. Echo 6/17: severe conc LVH, vigorous EF, EF 65-70%, no dynamic obstruction, no RWMA, Gr 1 DD, mild TR  //  b. 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 LHC 2002: irregs  //  b. LHC in 8/17: no sig CAD, apical DK, hyperdynamic LV, LVEDP 28   History of kidney stones    History of nuclear stress test    a. Nuc 7/17: Overall, intermediate risk nuclear stress test secondary to small size of apical lateral defect and reduced ejection fraction.  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    Kidney calculi    Palpitations    Pneumonia    PTSD (post-traumatic stress disorder)    Tobacco abuse    Transient ischemic attack     Surgeries: Procedure(s): AMPUTATION, ABOVE KNEE APPLICATION, WOUND VAC on 03/02/2024   Consultants:   Discharged Condition: Improved  Hospital Course: Troy [name] is an 63 y.o. male who was admitted 03/02/2024 with a chief complaint of No chief complaint on file. , and found to have a diagnosis of Non-healing surgical wound.  They were brought to the  operating room on 03/02/2024 and underwent the above named procedures.    They were given perioperative antibiotics:  Anti-infectives (From admission, onward)    Start     Dose/Rate Route Frequency Ordered Stop   03/02/24 1500  ceFAZolin  (ANCEF ) IVPB 2g/100 mL premix        2 g 200 mL/hr over 30 Minutes Intravenous Every 8 hours 03/02/24 1158 03/02/24 2322   03/02/24 0715  vancomycin  (VANCOCIN ) IVPB 1000 mg/200 mL premix        1,000 mg 200 mL/hr over 60 Minutes Intravenous On call to O.R. 03/02/24 0711 03/02/24 0845   03/02/24 0715  ceFAZolin  (ANCEF ) IVPB 2g/100 mL premix        2 g 200 mL/hr over 30 Minutes Intravenous On call to O.R. 03/02/24 9288 03/02/24 0900     .  They were given compression stockings, early ambulation, and chemoprophylaxis for DVT prophylaxis.  They benefited maximally from their hospital stay and there were no complications.    Recent vital signs:  Vitals:   03/04/24 1900 03/05/24 0300  BP: (!) 119/55 123/68  Pulse: (!) 107 (!) 109  Resp: 15 16  Temp: 98.6 F (37 C) 98.2 F (36.8 C)  SpO2: 95% 100%    Recent laboratory studies:  Results for orders placed or performed during the hospital encounter of 03/02/24  Surgical pcr screen   Collection Time: 03/02/24  7:03 AM   Specimen: Nasal Mucosa; Nasal Swab  Result Value Ref Range   MRSA, PCR NEGATIVE NEGATIVE   Staphylococcus aureus NEGATIVE NEGATIVE  CBC WITH DIFFERENTIAL   Collection Time: 03/02/24  7:12 AM  Result Value Ref Range   WBC 16.8 (H) 4.0 - 10.5 K/uL   RBC 5.29 4.22 - 5.81 MIL/uL   Hemoglobin 11.2 (L) 13.0 - 17.0 g/dL   HCT 60.8 60.9 - 47.9 %   MCV 73.9 (L) 80.0 - 100.0 fL   MCH 21.2 (L) 26.0 - 34.0 pg   MCHC 28.6 (L) 30.0 - 36.0 g/dL   RDW 82.4 (H) 88.4 - 84.4 %   Platelets 281 150 - 400 K/uL   nRBC 0.0 0.0 - 0.2 %   Neutrophils Relative % 70 %   Neutro Abs 11.8 (H) 1.7 - 7.7 K/uL   Lymphocytes Relative 20 %   Lymphs Abs 3.3 0.7 - 4.0 K/uL   Monocytes Relative 6 %    Monocytes Absolute 1.1 (H) 0.1 - 1.0 K/uL   Eosinophils Relative 2 %   Eosinophils Absolute 0.4 0.0 - 0.5 K/uL   Basophils Relative 1 %   Basophils Absolute 0.1 0.0 - 0.1 K/uL   Immature Granulocytes 1 %   Abs Immature Granulocytes 0.12 (H) 0.00 - 0.07 K/uL  Comprehensive metabolic panel   Collection Time: 03/02/24  7:12 AM  Result Value Ref Range   Sodium 139 135 - 145 mmol/L   Potassium 3.7 3.5 - 5.1 mmol/L   Chloride 105 98 - 111 mmol/L   CO2 22 22 - 32 mmol/L   Glucose, Bld 138 (H) 70 - 99 mg/dL   BUN 12 8 - 23 mg/dL   Creatinine, Ser 9.34 0.61 - 1.24 mg/dL   Calcium  8.9 8.9 - 10.3 mg/dL   Total Protein 7.4 6.5 - 8.1 g/dL   Albumin 3.4 (L) 3.5 - 5.0 g/dL   AST 17 15 - 41 U/L   ALT 9 0 - 44 U/L   Alkaline Phosphatase 67 38 - 126 U/L   Total Bilirubin 0.4 0.0 - 1.2 mg/dL   GFR, Estimated >39 >39 mL/min   Anion gap 12 5 - 15  Glucose, capillary   Collection Time: 03/02/24  7:20 AM  Result Value Ref Range   Glucose-Capillary 146 (H) 70 - 99 mg/dL  Glucose, capillary   Collection Time: 03/02/24  9:38 AM  Result Value Ref Range   Glucose-Capillary 171 (H) 70 - 99 mg/dL  Glucose, capillary   Collection Time: 03/02/24  4:05 PM  Result Value Ref Range   Glucose-Capillary 183 (H) 70 - 99 mg/dL  Glucose, capillary   Collection Time: 03/02/24  8:07 PM  Result Value Ref Range   Glucose-Capillary 222 (H) 70 - 99 mg/dL  Glucose, capillary   Collection Time: 03/03/24  6:39 AM  Result Value Ref Range   Glucose-Capillary 145 (H) 70 - 99 mg/dL  CBC   Collection Time: 03/03/24  7:00 AM  Result Value Ref Range   WBC 21.3 (H) 4.0 - 10.5 K/uL   RBC 4.39 4.22 - 5.81 MIL/uL   Hemoglobin 9.4 (L) 13.0 - 17.0 g/dL   HCT 68.4 (L) 60.9 - 47.9 %   MCV 71.8 (L) 80.0 - 100.0 fL   MCH 21.4 (L) 26.0 - 34.0 pg   MCHC 29.8 (L) 30.0 - 36.0 g/dL  RDW 17.4 (H) 11.5 - 15.5 %   Platelets 304 150 - 400 K/uL   nRBC 0.0 0.0 - 0.2 %  Basic metabolic panel   Collection Time: 03/03/24  7:00 AM   Result Value Ref Range   Sodium 137 135 - 145 mmol/L   Potassium 4.0 3.5 - 5.1 mmol/L   Chloride 99 98 - 111 mmol/L   CO2 24 22 - 32 mmol/L   Glucose, Bld 142 (H) 70 - 99 mg/dL   BUN 16 8 - 23 mg/dL   Creatinine, Ser 9.33 0.61 - 1.24 mg/dL   Calcium  8.7 (L) 8.9 - 10.3 mg/dL   GFR, Estimated >39 >39 mL/min   Anion gap 14 5 - 15  Glucose, capillary   Collection Time: 03/03/24 12:09 PM  Result Value Ref Range   Glucose-Capillary 221 (H) 70 - 99 mg/dL  Glucose, capillary   Collection Time: 03/03/24  4:55 PM  Result Value Ref Range   Glucose-Capillary 134 (H) 70 - 99 mg/dL  Glucose, capillary   Collection Time: 03/03/24  9:21 PM  Result Value Ref Range   Glucose-Capillary 180 (H) 70 - 99 mg/dL  CBC   Collection Time: 03/04/24  4:27 AM  Result Value Ref Range   WBC 20.0 (H) 4.0 - 10.5 K/uL   RBC 3.83 (L) 4.22 - 5.81 MIL/uL   Hemoglobin 8.3 (L) 13.0 - 17.0 g/dL   HCT 72.5 (L) 60.9 - 47.9 %   MCV 71.5 (L) 80.0 - 100.0 fL   MCH 21.7 (L) 26.0 - 34.0 pg   MCHC 30.3 30.0 - 36.0 g/dL   RDW 82.4 (H) 88.4 - 84.4 %   Platelets 222 150 - 400 K/uL   nRBC 0.0 0.0 - 0.2 %  Basic metabolic panel   Collection Time: 03/04/24  4:27 AM  Result Value Ref Range   Sodium 138 135 - 145 mmol/L   Potassium 3.5 3.5 - 5.1 mmol/L   Chloride 98 98 - 111 mmol/L   CO2 26 22 - 32 mmol/L   Glucose, Bld 137 (H) 70 - 99 mg/dL   BUN 16 8 - 23 mg/dL   Creatinine, Ser 9.27 0.61 - 1.24 mg/dL   Calcium  8.8 (L) 8.9 - 10.3 mg/dL   GFR, Estimated >39 >39 mL/min   Anion gap 14 5 - 15  Glucose, capillary   Collection Time: 03/04/24  6:18 AM  Result Value Ref Range   Glucose-Capillary 153 (H) 70 - 99 mg/dL  Glucose, capillary   Collection Time: 03/04/24 12:14 PM  Result Value Ref Range   Glucose-Capillary 133 (H) 70 - 99 mg/dL  Glucose, capillary   Collection Time: 03/04/24  4:49 PM  Result Value Ref Range   Glucose-Capillary 135 (H) 70 - 99 mg/dL  Glucose, capillary   Collection Time: 03/04/24  9:23 PM   Result Value Ref Range   Glucose-Capillary 153 (H) 70 - 99 mg/dL  Basic metabolic panel   Collection Time: 03/05/24  4:47 AM  Result Value Ref Range   Sodium 137 135 - 145 mmol/L   Potassium 3.5 3.5 - 5.1 mmol/L   Chloride 96 (L) 98 - 111 mmol/L   CO2 24 22 - 32 mmol/L   Glucose, Bld 122 (H) 70 - 99 mg/dL   BUN 15 8 - 23 mg/dL   Creatinine, Ser 9.29 0.61 - 1.24 mg/dL   Calcium  8.6 (L) 8.9 - 10.3 mg/dL   GFR, Estimated >39 >39 mL/min   Anion gap 17 (H) 5 -  15  Glucose, capillary   Collection Time: 03/05/24  6:15 AM  Result Value Ref Range   Glucose-Capillary 138 (H) 70 - 99 mg/dL    Discharge Medications:   Allergies as of 03/05/2024       Reactions   Hydromorphone  Hcl Er Anaphylaxis, Other (See Comments)   Allergic to DYE in extended-release tablet, can tolerate other forms of hydromorphone    Nucynta [tapentadol] Anaphylaxis, Swelling, Other (See Comments)   Throat angioedema   Exalamide Other (See Comments)   Unknown reaction        Medication List     STOP taking these medications    doxycycline  100 MG tablet Commonly known as: VIBRA -TABS       TAKE these medications    acetaminophen  500 MG tablet Commonly known as: TYLENOL  Take 500 mg by mouth 3 (three) times daily.   apixaban  5 MG Tabs tablet Commonly known as: ELIQUIS  Take 1 tablet (5 mg total) by mouth 2 (two) times daily.   divalproex  250 MG DR tablet Commonly known as: DEPAKOTE  Take 250 mg by mouth 2 (two) times daily.   FreeStyle Libre 3 Reader [employer] Use as directed with lifestyle libre sensors   [employer] 3 Sensor Misc Place 1 sensor on the skin every 14 days. Use to check glucose continuously   furosemide  40 MG tablet Commonly known as: LASIX  Take 1 tablet (40 mg total) by mouth daily.   glipiZIDE  2.5 MG Tabs Take 2.5 mg by mouth 2 (two) times daily before a meal.   insulin  degludec 100 UNIT/ML FlexTouch Pen Commonly known as: TRESIBA  Inject 13 Units into the skin daily.    insulin  lispro 100 UNIT/ML injection Commonly known as: HUMALOG  Inject 0-12 Units into the skin See admin instructions. Administer 0-12 units before meals and at bedtime per sliding scale:       < 70 : contact NP/PA   71-200 : 0 units 201-250 : 2 units 251-300 : 4 units 301-350 : 6 units 351-400 : 8 units 401-450 : 10 units 451-600 : 12 units     > 450 : 12 units, recheck in 2 hours. If > 350 or < 100 notify provider   [name]  10 MG Tabs tablet Generic drug: empagliflozin  Take 10 mg by mouth daily before breakfast.   metoprolol  tartrate 100 MG tablet Commonly known as: LOPRESSOR  Take 2 hours prior to CT   oxyCODONE -acetaminophen  5-325 MG tablet Commonly known as: PERCOCET/ROXICET Take 1 tablet by mouth every 4 (four) hours as needed.   pantoprazole  40 MG tablet Commonly known as: PROTONIX  Take 1 tablet (40 mg total) by mouth daily.   Pen Needles 3/16 31G X 5 MM Misc Use as directed with insulin  pen   rosuvastatin  40 MG tablet Commonly known as: CRESTOR  Take 1 tablet (40 mg total) by mouth daily.   Suvorexant  5 MG Tabs Take 5 mg by mouth at bedtime.        Diagnostic Studies: No results found.  Disposition: Discharge disposition: [facility]-Rehab Facility       Discharge Instructions     Call MD / Call 911   Complete by: As directed    If you experience chest pain or shortness of breath, CALL 911 and be transported to the hospital emergency room.  If you develope a fever above 101 F, pus (white drainage) or increased drainage or redness at the wound, or calf pain, call your surgeon's office.   Constipation Prevention   Complete by: As directed  Drink plenty of fluids.  Prune juice may be helpful.  You may use a stool softener, such as Colace (over the counter) 100 mg twice a day.  Use MiraLax  (over the counter) for constipation as needed.   Diet - low sodium heart healthy   Complete by: As directed    Increase activity slowly as tolerated   Complete by: As  directed    Negative Pressure Wound Therapy - Incisional   Complete by: As directed    Attach wound vac dressing to prevena plus wound vac pump for discharge   Post-operative opioid taper instructions:   Complete by: As directed    POST-OPERATIVE OPIOID TAPER INSTRUCTIONS: It is important to wean off of your opioid medication as soon as possible. If you do not need pain medication after your surgery it is ok to stop day one. Opioids include: Codeine, Hydrocodone (Norco, Vicodin), Oxycodone (Percocet, oxycontin ) and hydromorphone  amongst others.  Long term and even short term use of opiods can cause: Increased pain response Dependence Constipation Depression Respiratory depression And more.  Withdrawal symptoms can include Flu like symptoms Nausea, vomiting And more Techniques to manage these symptoms Hydrate well Eat regular healthy meals Stay active Use relaxation techniques(deep breathing, meditating, yoga) Do Not substitute Alcohol to help with tapering If you have been on opioids for less than two weeks and do not have pain than it is ok to stop all together.  Plan to wean off of opioids This plan should start within one week post op of your joint replacement. Maintain the same interval or time between taking each dose and first decrease the dose.  Cut the total daily intake of opioids by one tablet each day Next start to increase the time between doses. The last dose that should be eliminated is the evening dose.           Follow-up Information     Harden Jerona GAILS, MD Follow up in 1 week(s).   Specialty: Orthopedic Surgery Contact information: [address] [place]  [place] [address] [phone]                  Signed: Jerona GAILS Harden 03/05/2024, 6:48 AM

## 2024-03-06 LAB — SURGICAL PATHOLOGY

## 2024-03-13 ENCOUNTER — Encounter: Payer: Self-pay | Admitting: Family

## 2024-03-13 ENCOUNTER — Ambulatory Visit (INDEPENDENT_AMBULATORY_CARE_PROVIDER_SITE_OTHER): Admitting: Family

## 2024-03-13 DIAGNOSIS — Z89611 Acquired absence of right leg above knee: Secondary | ICD-10-CM

## 2024-03-13 NOTE — Progress Notes (Signed)
 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  Reports his wound VAC stopped working several days ago Ortho Exam On examination right residual limb this incisions well-approximated with staples there is no surrounding erythema or drainage  Visit Diagnoses: No diagnosis found.  Plan: Begin daily Dial  soap cleansing.  Dry dressings.  Plan to follow-up in 2 weeks for staple removal prescription for shrinkers faxed to [facility] this morning  Follow-Up Instructions: No follow-ups on file.   Imaging: No results found.  Orders:  No orders of the defined types were placed in this encounter.  No orders of the defined types were placed in this encounter.    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   (HFpEF) heart failure with preserved ejection fraction (HCC) 04/02/2023   Lactic acidosis 04/02/2023   Acute respiratory failure with hypoxia (HCC) 04/02/2023   Leukocytosis 04/02/2023   Uncontrolled type 2 diabetes mellitus with hyperglycemia, with long-term current use of insulin  (HCC) 04/02/2023   Hypochromic anemia 04/02/2023   PVD (peripheral vascular disease) 04/02/2023   Thrombocytosis 04/02/2023   Respiratory distress 03/21/2023   Osteomyelitis of ankle or foot, acute, right (HCC) 03/11/2023   Abscess of right lower leg 03/11/2023   Below-knee amputation of right lower extremity (HCC) 03/11/2023   Chronic respiratory failure with hypoxia (HCC) 02/12/2023    Pulmonary infiltrates 02/12/2023   Acute on chronic heart failure with preserved ejection fraction (HFpEF) (HCC) 02/12/2023   Chronic osteomyelitis of right foot with draining sinus (HCC) 02/07/2023   Paroxysmal atrial flutter (HCC) 02/05/2023   Hypotension 02/05/2023   Atrial fibrillation with RVR (HCC) 02/04/2023   Atrial flutter (HCC) 02/04/2023   Lower limb ischemia: ??? 08/22/2021   Diabetic acidosis without coma (HCC)    Hypokalemia    Hypomagnesemia    Hyponatremia    Multifocal pneumonia    Parapneumonic effusion 08/18/2021   Bowel Ileus (HCC) 08/18/2021   ?? 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   Ketoacidosis due to type 2 diabetes mellitus (HCC) 08/15/2021   COVID-19 virus infection 08/15/2021   S/P AKA (above knee amputation), left (HCC) 02/12/2021   Amputation of right great toe and 2nd Toe 02/12/2021   Sepsis due to left-sided neck cellulitis/MRSA 02/12/2021   Hypotensive episode    Elevated troponin    SIRS (systemic inflammatory response syndrome) (HCC) 02/11/2021   Abscess of bursa of left ankle 01/21/2021   Dehiscence of amputation stump (HCC)    Ischemia of left BKA site (HCC)    Abscess of leg without foot, left    Osteomyelitis of second toe of right foot (HCC) 07/12/2019   Below knee amputation status, left 01/25/2018   Dehiscence of amputation stump of right lower extremity (HCC)    Status post left foot surgery 12/15/2017   GERD (gastroesophageal reflux disease) 09/26/2017   Hyperlipidemia associated with type 2  diabetes mellitus (HCC) 07/08/2017   Snoring 06/07/2016   Chest tube in place 01/22/2016   Abnormal nuclear stress test 01/22/2016   Pain, chronic due to trauma 07/04/2012   Complex regional pain syndrome I of lower limb 07/04/2012   COPD (chronic obstructive pulmonary disease) (HCC) 01/27/2011   Pre-operative cardiovascular examination 01/27/2011   Nonspecific abnormal electrocardiogram (ECG) (EKG) 01/27/2011   Murmur 01/27/2011   DM2  (diabetes mellitus, type 2) (HCC)    HTN (hypertension)    HLD (hyperlipidemia)    Tobacco abuse    Past Medical History:  Diagnosis Date   Acute renal failure    in setting of NSAID use and orthopedic surgery 2010   Anxiety and depression    Chronic diastolic CHF (congestive heart failure) (HCC)    a. Echo 6/17: severe conc LVH, vigorous EF, EF 65-70%, no dynamic obstruction, no RWMA, Gr 1 DD, mild TR  //  b. 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 LHC 2002: irregs  //  b. LHC in 8/17: no sig CAD, apical DK, hyperdynamic LV, LVEDP 28   History of kidney stones    History of nuclear stress test    a. Nuc 7/17: Overall, intermediate risk nuclear stress test secondary to small size of apical lateral defect and reduced ejection fraction.  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    Kidney calculi    Palpitations    Pneumonia    PTSD (post-traumatic stress disorder)    Tobacco abuse    Transient ischemic attack     Family History  Problem Relation Age of Onset   Leukemia Mother 95       died   Lung cancer Father 31       died   Heart attack Brother 56   Heart attack Brother 30   Hypertension Brother        X3   Hypertension Sister        X2   Diabetes Sister    Stroke Sister    Diabetes Sister    Other Brother        Set designer accident    Past Surgical History:  Procedure Laterality Date   ABDOMINAL AORTOGRAM W/LOWER EXTREMITY N/A 01/31/2023   Procedure: ABDOMINAL AORTOGRAM W/LOWER EXTREMITY;  Surgeon: Sheree Penne Bruckner, MD;  Location: [facility] INVASIVE CV LAB;  Service: Cardiovascular;  Laterality: N/A;   AMPUTATION Left 01/03/2018   Procedure: LEFT MIDFOOT AMPUTATION/REVISION MIDAMPUTAION;  Surgeon: Vernetta Bruckner GRADE, MD;  Location: [facility] OR;  Service: Orthopedics;  Laterality: Left;   AMPUTATION Left 01/25/2018   Procedure: LEFT BELOW KNEE AMPUTATION;  Surgeon: Harden Jerona GAILS, MD;  Location: [facility] OR;  Service: Orthopedics;  Laterality: Left;   AMPUTATION Left 01/21/2021   Procedure: LEFT ABOVE KNEE AMPUTATION;  Surgeon: Harden Jerona GAILS, MD;  Location: [facility] OR;  Service: Orthopedics;  Laterality: Left;  AMPUTATION Right 02/09/2023   Procedure: RIGHT TRANSMETATARSAL AMPUTATION;  Surgeon: Harden Jerona GAILS, MD;  Location: [facility] OR;  Service: Orthopedics;  Laterality: Right;   AMPUTATION Right 03/11/2023   Procedure: RIGHT BELOW KNEE AMPUTATION;  Surgeon: Harden Jerona GAILS, MD;  Location: [facility] OR;  Service: Orthopedics;  Laterality: Right;   AMPUTATION Right 03/02/2024   Procedure: AMPUTATION, ABOVE KNEE;  Surgeon: Harden Jerona GAILS, MD;  Location: [facility] OR;  Service: Orthopedics;  Laterality: Right;   AMPUTATION TOE Right 07/17/2019   Procedure: AMPUTATION RIGHT FOOT 2ND TOE;  Surgeon: Vernetta Lonni GRADE, MD;  Location: [facility];  Service: Orthopedics;  Laterality: Right;   APPLICATION OF WOUND VAC Left 01/21/2021   Procedure: APPLICATION OF WOUND VAC;  Surgeon: Harden Jerona GAILS, MD;  Location: [facility] OR;  Service: Orthopedics;  Laterality: Left;   APPLICATION OF WOUND VAC Right 02/10/2024   Procedure: APPLICATION, WOUND VAC;  Surgeon: Harden Jerona GAILS, MD;  Location: [facility] OR;  Service: Orthopedics;  Laterality: Right;   APPLICATION OF WOUND VAC Right 03/02/2024   Procedure: APPLICATION, WOUND VAC;  Surgeon: Harden Jerona GAILS, MD;  Location: [facility] OR;  Service: Orthopedics;  Laterality: Right;   BELOW KNEE LEG AMPUTATION Left 01/25/2018   CARDIAC CATHETERIZATION N/A 01/22/2016   Procedure: Left Heart Cath and Coronary  Angiography;  Surgeon: Peter M Swaziland, MD;  Location: [facility] INVASIVE CV LAB;  Service: Cardiovascular;  Laterality: N/A;   CARDIOVERSION N/A 03/17/2023   Procedure: CARDIOVERSION;  Surgeon: Mona Vinie BROCKS, MD;  Location: [facility] INVASIVE CV LAB;  Service: Cardiovascular;  Laterality: N/A;   FOOT AMPUTATION Bilateral    I & D EXTREMITY Left 12/15/2017   Procedure: IRRIGATION AND DEBRIDEMENT LEFT FOOT ULCER;  Surgeon: Vernetta Lonni GRADE, MD;  Location: [facility] ORS;  Service: Orthopedics;  Laterality: Left;   I & D EXTREMITY Left 07/25/2020   Procedure: LEFT BELOW KNEE AMPUTATION ABSCESS EXCISION AND SKIN GRAFT;  Surgeon: Harden Jerona GAILS, MD;  Location: [facility] OR;  Service: Orthopedics;  Laterality: Left;   I & D EXTREMITY Left 08/22/2020   Procedure: DEBRIDEMENT LEFT BELOW KNEE AMPUTATION AND APPLY KERECIS SKIN GRAFT;  Surgeon: Harden Jerona GAILS, MD;  Location: [facility] OR;  Service: Orthopedics;  Laterality: Left;   LITHOTRIPSY     PERIPHERAL VASCULAR INTERVENTION  01/31/2023   Procedure: PERIPHERAL VASCULAR INTERVENTION;  Surgeon: Sheree Penne Lonni, MD;  Location: [facility] INVASIVE CV LAB;  Service: Cardiovascular;;   REVISION AMPUTATION, BELOW THE KNEE Right 02/10/2024   Procedure: REVISION AMPUTATION, BELOW THE KNEE;  Surgeon: Harden Jerona GAILS, MD;  Location: [facility] OR;  Service: Orthopedics;  Laterality: Right;   TEE WITHOUT CARDIOVERSION N/A 03/17/2023   Procedure: TRANSESOPHAGEAL ECHOCARDIOGRAM;  Surgeon: Mona Vinie BROCKS, MD;  Location: [facility] INVASIVE CV LAB;  Service: Cardiovascular;  Laterality: N/A;   TENDON LENGTHENING Bilateral    calf   TONSILLECTOMY     Social History   Occupational History   Occupation: DISABLED  Tobacco Use   Smoking status: Former    Current packs/day: 1.00    Average packs/day: 1 pack/day for 40.0 years (40.0 ttl pk-yrs)    Types: Cigarettes   Smokeless tobacco: Never  Vaping Use   Vaping status: Never Used  Substance and Sexual Activity   Alcohol use: No   Drug use: No   Sexual  activity: Not on file

## 2024-03-16 ENCOUNTER — Encounter: Admitting: Family

## 2024-03-22 ENCOUNTER — Telehealth: Payer: Self-pay | Admitting: Neurology

## 2024-03-22 ENCOUNTER — Encounter: Payer: Self-pay | Admitting: Neurology

## 2024-03-22 ENCOUNTER — Ambulatory Visit: Admitting: Neurology

## 2024-03-22 VITALS — BP 122/58 | HR 86 | Wt 177.0 lb

## 2024-03-22 DIAGNOSIS — R413 Other amnesia: Secondary | ICD-10-CM

## 2024-03-22 DIAGNOSIS — Z8673 Personal history of transient ischemic attack (TIA), and cerebral infarction without residual deficits: Secondary | ICD-10-CM

## 2024-03-22 DIAGNOSIS — G3184 Mild cognitive impairment, so stated: Secondary | ICD-10-CM

## 2024-03-22 NOTE — Patient Instructions (Signed)
 I had a long discussion with the patient and his wife about his progressive memory loss and cognitive concerns and recommend further evaluation by checking dementia panel labs, EEG and MRI scan.  I encouraged him to increase participation in cognitively challenging activities like solving crossword puzzles, playing bridge and sudoku.  We also discussed memory compensation strategies.  Continue Eliquis  for stroke prevention for atrial fibrillation and maintain aggressive risk factor modification with strict control of diabetes with hemoglobin A1c goal below 6.5%, hypertension with blood pressure goal below 130/90, lipids with LDL cholesterol goal below 70 mg percent.  Return for follow-up in the future in 6 months with Harlene nurse practitioner call earlier if necessary.  Memory Compensation Strategies  Use WARM strategy.  W= write it down  A= associate it  R= repeat it  M= make a mental note  2.   You can keep a Glass blower/designer.  Use a 3-ring notebook with sections for the following: calendar, important names and phone numbers,  medications, doctors' names/phone numbers, lists/reminders, and a section to journal what you did  each day.   3.    Use a calendar to write appointments down.  4.    Write yourself a schedule for the day.  This can be placed on the calendar or in a separate section of the Memory Notebook.  Keeping a  regular schedule can help memory.  5.    Use medication organizer with sections for each day or morning/evening pills.  You may need help loading it  6.    Keep a basket, or pegboard by the door.  Place items that you need to take out with you in the basket or on the pegboard.  You may also want to  include a message board for reminders.  7.    Use sticky notes.  Place sticky notes with reminders in a place where the task is performed.  For example:  turn off the  stove placed by the stove, lock the door placed on the door at eye level,  take your medications  on  the bathroom mirror or by the place where you normally take your medications.  8.    Use alarms/timers.  Use while cooking to remind yourself to check on food or as a reminder to take your medicine, or as a  reminder to make a call, or as a reminder to perform another task, etc.
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
## 2024-03-22 NOTE — Telephone Encounter (Signed)
 no auth required sent to [facility] [phone]

## 2024-03-27 ENCOUNTER — Ambulatory Visit (INDEPENDENT_AMBULATORY_CARE_PROVIDER_SITE_OTHER): Admitting: Family

## 2024-03-27 DIAGNOSIS — Z89612 Acquired absence of left leg above knee: Secondary | ICD-10-CM

## 2024-03-27 DIAGNOSIS — S78111A Complete traumatic amputation at level between right hip and knee, initial encounter: Secondary | ICD-10-CM

## 2024-03-27 NOTE — Progress Notes (Signed)
 Post-Op Visit Note   Patient: Troy [name]           Date of Birth: 1961-01-05           MRN: [id number] Visit Date: 03/27/2024 PCP: Baird Comer GAILS, NP  Chief Complaint:  Chief Complaint  Patient presents with   Right Leg - Routine Post Op    02/10/24 revision right BKA    HPI:  HPI The patient is a 63 year old gentleman seen status post right above-knee amputation Ortho Exam On examination right above-knee amputation staples are in place there is no gaping or drainage no erythema  Visit Diagnoses: No diagnosis found.  Plan: Staples harvested.  Continue daily Dial  soap cleansing dry dressing shrinker around-the-clock once obtained follow-up in the office once more in 2 weeks  Follow-Up Instructions: No follow-ups on file.   Imaging: No results found.  Orders:  No orders of the defined types were placed in this encounter.  No orders of the defined types were placed in this encounter.    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   (HFpEF) heart failure with preserved ejection fraction (HCC) 04/02/2023   Lactic acidosis 04/02/2023   Acute respiratory failure with hypoxia (HCC) 04/02/2023   Leukocytosis 04/02/2023   Uncontrolled type 2 diabetes mellitus with hyperglycemia, with long-term current use of insulin  (HCC) 04/02/2023   Hypochromic anemia 04/02/2023   PVD (peripheral vascular disease) 04/02/2023   Thrombocytosis 04/02/2023   Respiratory distress 03/21/2023   Osteomyelitis of ankle or foot, acute, right (HCC) 03/11/2023   Abscess of right lower leg 03/11/2023   Below-knee amputation of right lower extremity (HCC) 03/11/2023   Chronic respiratory failure with hypoxia (HCC) 02/12/2023   Pulmonary infiltrates 02/12/2023   Acute on chronic heart failure with preserved  ejection fraction (HFpEF) (HCC) 02/12/2023   Chronic osteomyelitis of right foot with draining sinus (HCC) 02/07/2023   Paroxysmal atrial flutter (HCC) 02/05/2023   Hypotension 02/05/2023   Atrial fibrillation with RVR (HCC) 02/04/2023   Atrial flutter (HCC) 02/04/2023   Lower limb ischemia: ??? 08/22/2021   Diabetic acidosis without coma (HCC)    Hypokalemia    Hypomagnesemia    Hyponatremia    Multifocal pneumonia    Parapneumonic effusion 08/18/2021   Bowel Ileus (HCC) 08/18/2021   ?? 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   Ketoacidosis due to type 2 diabetes mellitus (HCC) 08/15/2021   COVID-19 virus infection 08/15/2021   S/P AKA (above knee amputation), left (HCC) 02/12/2021   Amputation of right great toe and 2nd Toe 02/12/2021   Sepsis due to left-sided neck cellulitis/MRSA 02/12/2021   Hypotensive episode    Elevated troponin    SIRS (systemic inflammatory response syndrome) (HCC) 02/11/2021   Abscess of bursa of left ankle 01/21/2021   Dehiscence of amputation stump (HCC)    Ischemia of left BKA site (HCC)    Abscess of leg without foot, left    Osteomyelitis of second toe of right foot (HCC) 07/12/2019   Below knee amputation status, left 01/25/2018   Dehiscence of amputation stump of right lower extremity (HCC)    Status post left foot surgery 12/15/2017   GERD (gastroesophageal reflux disease) 09/26/2017   Hyperlipidemia associated with type 2 diabetes mellitus (HCC) 07/08/2017   Snoring 06/07/2016   Chest tube in place 01/22/2016  Abnormal nuclear stress test 01/22/2016   Pain, chronic due to trauma 07/04/2012   Complex regional pain syndrome I of lower limb 07/04/2012   COPD (chronic obstructive pulmonary disease) (HCC) 01/27/2011   Pre-operative cardiovascular examination 01/27/2011   Nonspecific abnormal electrocardiogram (ECG) (EKG) 01/27/2011   Murmur 01/27/2011   DM2 (diabetes mellitus, type 2) (HCC)    HTN (hypertension)    HLD (hyperlipidemia)     Tobacco abuse    Past Medical History:  Diagnosis Date   Acute renal failure    in setting of NSAID use and orthopedic surgery 2010   Anxiety and depression    Chronic diastolic CHF (congestive heart failure) (HCC)    a. Echo 6/17: severe conc LVH, vigorous EF, EF 65-70%, no dynamic obstruction, no RWMA, Gr 1 DD, mild TR  //  b. 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 LHC 2002: irregs  //  b. LHC in 8/17: no sig CAD, apical DK, hyperdynamic LV, LVEDP 28   History of kidney stones    History of nuclear stress test    a. Nuc 7/17: Overall, intermediate risk nuclear stress test secondary to small size of apical lateral defect and reduced ejection fraction.  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    Kidney calculi    Palpitations    Pneumonia    PTSD (post-traumatic stress disorder)    Tobacco abuse    Transient ischemic attack     Family History  Problem Relation Age of Onset   Leukemia Mother 24       died   Lung cancer Father 41       died   Heart attack Brother 64   Heart attack Brother 87   Hypertension Brother        X3   Hypertension Sister        X2   Diabetes Sister    Stroke Sister    Diabetes Sister    Other Brother        Set designer accident    Past Surgical History:  Procedure Laterality Date   ABDOMINAL AORTOGRAM W/LOWER EXTREMITY N/A 01/31/2023   Procedure: ABDOMINAL AORTOGRAM W/LOWER EXTREMITY;  Surgeon: Sheree Penne Bruckner, MD;  Location[facility] INVASIVE CV LAB;  Service: Cardiovascular;  Laterality:  N/A;   AMPUTATION Left 01/03/2018   Procedure: LEFT MIDFOOT AMPUTATION/REVISION MIDAMPUTAION;  Surgeon: Vernetta Bruckner GRADE, MD;  Location: [facility] OR;  Service: Orthopedics;  Laterality: Left;   AMPUTATION Left 01/25/2018   Procedure: LEFT BELOW KNEE AMPUTATION;  Surgeon: Harden Jerona GAILS, MD;  Location: [facility] OR;  Service: Orthopedics;  Laterality: Left;   AMPUTATION Left 01/21/2021   Procedure: LEFT ABOVE KNEE AMPUTATION;  Surgeon: Harden Jerona GAILS, MD;  Location: [facility] OR;  Service: Orthopedics;  Laterality: Left;   AMPUTATION Right 02/09/2023   Procedure: RIGHT TRANSMETATARSAL AMPUTATION;  Surgeon: Harden Jerona GAILS, MD;  Location: [facility] OR;  Service: Orthopedics;  Laterality: Right;   AMPUTATION Right 03/11/2023   Procedure: RIGHT BELOW KNEE AMPUTATION;  Surgeon: Harden Jerona GAILS, MD;  Location: [facility] OR;  Service: Orthopedics;  Laterality: Right;   AMPUTATION Right 03/02/2024   Procedure: AMPUTATION, ABOVE KNEE;  Surgeon: Harden Jerona GAILS, MD;  Location: [facility] OR;  Service: Orthopedics;  Laterality: Right;   AMPUTATION TOE Right 07/17/2019   Procedure: AMPUTATION RIGHT FOOT 2ND TOE;  Surgeon: Vernetta Lonni GRADE, MD;  Location: [facility];  Service: Orthopedics;  Laterality: Right;   APPLICATION OF WOUND VAC Left 01/21/2021   Procedure: APPLICATION OF WOUND VAC;  Surgeon: Harden Jerona GAILS, MD;  Location: [facility] OR;  Service: Orthopedics;  Laterality: Left;   APPLICATION OF WOUND VAC Right 02/10/2024   Procedure: APPLICATION, WOUND VAC;  Surgeon: Harden Jerona GAILS, MD;  Location: [facility] OR;  Service: Orthopedics;  Laterality: Right;   APPLICATION OF WOUND VAC Right 03/02/2024   Procedure: APPLICATION, WOUND VAC;  Surgeon: Harden Jerona GAILS, MD;  Location: [facility] OR;  Service: Orthopedics;  Laterality: Right;   BELOW KNEE LEG AMPUTATION Left 01/25/2018   CARDIAC CATHETERIZATION N/A 01/22/2016   Procedure: Left Heart Cath and Coronary Angiography;  Surgeon: Peter M Swaziland, MD;  Location[facility] INVASIVE CV LAB;  Service:  Cardiovascular;  Laterality: N/A;   CARDIOVERSION N/A 03/17/2023   Procedure: CARDIOVERSION;  Surgeon: Mona Vinie BROCKS, MD;  Location: [facility] INVASIVE CV LAB;  Service: Cardiovascular;  Laterality: N/A;   FOOT AMPUTATION Bilateral    I & D EXTREMITY Left 12/15/2017   Procedure: IRRIGATION AND DEBRIDEMENT LEFT FOOT ULCER;  Surgeon: Vernetta Lonni GRADE, MD;  Location: [facility] ORS;  Service: Orthopedics;  Laterality: Left;   I & D EXTREMITY Left 07/25/2020   Procedure: LEFT BELOW KNEE AMPUTATION ABSCESS EXCISION AND SKIN GRAFT;  Surgeon: Harden Jerona GAILS, MD;  Location: [facility] OR;  Service: Orthopedics;  Laterality: Left;   I & D EXTREMITY Left 08/22/2020   Procedure: DEBRIDEMENT LEFT BELOW KNEE AMPUTATION AND APPLY KERECIS SKIN GRAFT;  Surgeon: Harden Jerona GAILS, MD;  Location: [facility] OR;  Service: Orthopedics;  Laterality: Left;   LITHOTRIPSY     PERIPHERAL VASCULAR INTERVENTION  01/31/2023   Procedure: PERIPHERAL VASCULAR INTERVENTION;  Surgeon: Sheree Penne Lonni, MD;  Location: [facility] INVASIVE CV LAB;  Service: Cardiovascular;;   REVISION AMPUTATION, BELOW THE KNEE Right 02/10/2024   Procedure: REVISION AMPUTATION, BELOW THE KNEE;  Surgeon: Harden Jerona GAILS, MD;  Location[facility] OR;  Service: Orthopedics;  Laterality: Right;   TEE WITHOUT CARDIOVERSION N/A 03/17/2023   Procedure: TRANSESOPHAGEAL ECHOCARDIOGRAM;  Surgeon: Mona Vinie BROCKS, MD;  Location: [facility] INVASIVE CV LAB;  Service: Cardiovascular;  Laterality: N/A;   TENDON LENGTHENING Bilateral    calf   TONSILLECTOMY     Social History   Occupational History   Occupation: DISABLED  Tobacco Use   Smoking status: Former    Current packs/day: 1.00    Average packs/day: 1 pack/day for 40.0 years (40.0 ttl pk-yrs)    Types: Cigarettes   Smokeless tobacco: Never  Vaping Use   Vaping status: Never Used  Substance and Sexual Activity   Alcohol use: No   Drug use: No   Sexual activity: Not on file

## 2024-04-09 ENCOUNTER — Ambulatory Visit (INDEPENDENT_AMBULATORY_CARE_PROVIDER_SITE_OTHER): Admitting: Neurology

## 2024-04-09 DIAGNOSIS — R413 Other amnesia: Secondary | ICD-10-CM

## 2024-04-09 DIAGNOSIS — R4182 Altered mental status, unspecified: Secondary | ICD-10-CM | POA: Diagnosis not present

## 2024-04-10 ENCOUNTER — Encounter: Payer: Self-pay | Admitting: Family

## 2024-04-10 ENCOUNTER — Ambulatory Visit (INDEPENDENT_AMBULATORY_CARE_PROVIDER_SITE_OTHER): Admitting: Family

## 2024-04-10 DIAGNOSIS — Z89611 Acquired absence of right leg above knee: Secondary | ICD-10-CM

## 2024-04-10 DIAGNOSIS — S78111A Complete traumatic amputation at level between right hip and knee, initial encounter: Secondary | ICD-10-CM

## 2024-04-10 DIAGNOSIS — Z89612 Acquired absence of left leg above knee: Secondary | ICD-10-CM

## 2024-04-10 NOTE — Progress Notes (Signed)
 Post-Op Visit Note   Patient: Troy [name]           Date of Birth: 27-Apr-1961           MRN: [id number] Visit Date: 04/10/2024 PCP: Baird Comer GAILS, NP  Chief Complaint:  Chief Complaint  Patient presents with   Right Leg - Routine Post Op    02/10/24 revision right BKA    HPI:  HPI The patient is a 63 year old gentleman seen status post right above-knee amputation he is residing at skilled nursing.  Is not interested in prosthesis set up.  Ortho Exam On examination right residual limb this is well-healed.  There is no erythema no open area  Visit Diagnoses: No diagnosis found.  Plan: Given an order for shrinkers for bilateral residual limbs.  He will follow-up in the office as needed  Follow-Up Instructions: No follow-ups on file.   Imaging: No results found.  Orders:  No orders of the defined types were placed in this encounter.  No orders of the defined types were placed in this encounter.    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   (HFpEF) heart failure with preserved ejection fraction (HCC) 04/02/2023   Lactic acidosis 04/02/2023   Acute respiratory failure with hypoxia (HCC) 04/02/2023   Leukocytosis 04/02/2023   Uncontrolled type 2 diabetes mellitus with hyperglycemia, with long-term current use of insulin  (HCC) 04/02/2023   Hypochromic anemia 04/02/2023   PVD (peripheral vascular disease) 04/02/2023   Thrombocytosis 04/02/2023   Respiratory distress 03/21/2023   Osteomyelitis of ankle or foot, acute, right (HCC) 03/11/2023   Abscess of right lower leg 03/11/2023   Below-knee amputation of right lower extremity (HCC) 03/11/2023   Chronic respiratory failure with hypoxia (HCC) 02/12/2023   Pulmonary infiltrates 02/12/2023   Acute on chronic heart failure with  preserved ejection fraction (HFpEF) (HCC) 02/12/2023   Chronic osteomyelitis of right foot with draining sinus (HCC) 02/07/2023   Paroxysmal atrial flutter (HCC) 02/05/2023   Hypotension 02/05/2023   Atrial fibrillation with RVR (HCC) 02/04/2023   Atrial flutter (HCC) 02/04/2023   Lower limb ischemia: ??? 08/22/2021   Diabetic acidosis without coma (HCC)    Hypokalemia    Hypomagnesemia    Hyponatremia    Multifocal pneumonia    Parapneumonic effusion 08/18/2021   Bowel Ileus (HCC) 08/18/2021   ?? 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   Ketoacidosis due to type 2 diabetes mellitus (HCC) 08/15/2021   COVID-19 virus infection 08/15/2021   S/P AKA (above knee amputation), left (HCC) 02/12/2021   Amputation of right great toe and 2nd Toe 02/12/2021   Sepsis due to left-sided neck cellulitis/MRSA 02/12/2021   Hypotensive episode    Elevated troponin    SIRS (systemic inflammatory response syndrome) (HCC) 02/11/2021   Abscess of bursa of left ankle 01/21/2021   Dehiscence of amputation stump (HCC)    Ischemia of left BKA site (HCC)    Abscess of leg without foot, left    Osteomyelitis of second toe of right foot (HCC) 07/12/2019   Below knee amputation status, left 01/25/2018   Dehiscence of amputation stump of right lower extremity (HCC)    Status post left foot surgery 12/15/2017   GERD (gastroesophageal reflux disease) 09/26/2017   Hyperlipidemia associated with type 2 diabetes mellitus (HCC) 07/08/2017   Snoring 06/07/2016  Chest tube in place 01/22/2016   Abnormal nuclear stress test 01/22/2016   Pain, chronic due to trauma 07/04/2012   Complex regional pain syndrome I of lower limb 07/04/2012   COPD (chronic obstructive pulmonary disease) (HCC) 01/27/2011   Pre-operative cardiovascular examination 01/27/2011   Nonspecific abnormal electrocardiogram (ECG) (EKG) 01/27/2011   Murmur 01/27/2011   DM2 (diabetes mellitus, type 2) (HCC)    HTN (hypertension)    HLD  (hyperlipidemia)    Tobacco abuse    Past Medical History:  Diagnosis Date   Acute renal failure    in setting of NSAID use and orthopedic surgery 2010   Anxiety and depression    Chronic diastolic CHF (congestive heart failure) (HCC)    a. Echo 6/17: severe conc LVH, vigorous EF, EF 65-70%, no dynamic obstruction, no RWMA, Gr 1 DD, mild TR  //  b. 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 LHC 2002: irregs  //  b. LHC in 8/17: no sig CAD, apical DK, hyperdynamic LV, LVEDP 28   History of kidney stones    History of nuclear stress test    a. Nuc 7/17: Overall, intermediate risk nuclear stress test secondary to small size of apical lateral defect and reduced ejection fraction.  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    Kidney calculi    Palpitations    Pneumonia    PTSD (post-traumatic stress disorder)    Tobacco abuse    Transient ischemic attack     Family History  Problem Relation Age of Onset   Leukemia Mother 10       died   Lung cancer Father 5       died   Heart attack Brother 68   Heart attack Brother 42   Hypertension Brother        X3   Hypertension Sister        X2   Diabetes Sister    Stroke Sister    Diabetes Sister    Other Brother        Set designer accident    Past Surgical History:  Procedure Laterality Date   ABDOMINAL AORTOGRAM W/LOWER EXTREMITY N/A 01/31/2023   Procedure: ABDOMINAL AORTOGRAM W/LOWER EXTREMITY;  Surgeon: Sheree Penne Bruckner, MD;  Location: [facility] INVASIVE CV LAB;  Service:  Cardiovascular;  Laterality: N/A;   AMPUTATION Left 01/03/2018   Procedure: LEFT MIDFOOT AMPUTATION/REVISION MIDAMPUTAION;  Surgeon: Vernetta Bruckner GRADE, MD;  Location: [facility] OR;  Service: Orthopedics;  Laterality: Left;   AMPUTATION Left 01/25/2018   Procedure: LEFT BELOW KNEE AMPUTATION;  Surgeon: Harden Jerona GAILS, MD;  Location: [facility] OR;  Service: Orthopedics;  Laterality: Left;   AMPUTATION Left 01/21/2021   Procedure: LEFT ABOVE KNEE AMPUTATION;  Surgeon: Harden Jerona GAILS, MD;  Location: [facility] OR;  Service: Orthopedics;  Laterality: Left;   AMPUTATION Right 02/09/2023   Procedure: RIGHT TRANSMETATARSAL  AMPUTATION;  Surgeon: Harden Jerona GAILS, MD;  Location: [facility] OR;  Service: Orthopedics;  Laterality: Right;   AMPUTATION Right 03/11/2023   Procedure: RIGHT BELOW KNEE AMPUTATION;  Surgeon: Harden Jerona GAILS, MD;  Location: [facility] OR;  Service: Orthopedics;  Laterality: Right;   AMPUTATION Right 03/02/2024   Procedure: AMPUTATION, ABOVE KNEE;  Surgeon: Harden Jerona GAILS, MD;  Location: [facility] OR;  Service: Orthopedics;  Laterality: Right;   AMPUTATION TOE Right 07/17/2019   Procedure: AMPUTATION RIGHT FOOT 2ND TOE;  Surgeon: Vernetta Lonni GRADE, MD;  Location: [facility];  Service: Orthopedics;  Laterality: Right;   APPLICATION OF WOUND VAC Left 01/21/2021   Procedure: APPLICATION OF WOUND VAC;  Surgeon: Harden Jerona GAILS, MD;  Location: [facility] OR;  Service: Orthopedics;  Laterality: Left;   APPLICATION OF WOUND VAC Right 02/10/2024   Procedure: APPLICATION, WOUND VAC;  Surgeon: Harden Jerona GAILS, MD;  Location: [facility] OR;  Service: Orthopedics;  Laterality: Right;   APPLICATION OF WOUND VAC Right 03/02/2024   Procedure: APPLICATION, WOUND VAC;  Surgeon: Harden Jerona GAILS, MD;  Location: [facility] OR;  Service: Orthopedics;  Laterality: Right;   BELOW KNEE LEG AMPUTATION Left 01/25/2018   CARDIAC CATHETERIZATION N/A 01/22/2016   Procedure: Left Heart Cath and Coronary Angiography;  Surgeon: Peter M Swaziland, MD;  Location: [facility] INVASIVE CV  LAB;  Service: Cardiovascular;  Laterality: N/A;   CARDIOVERSION N/A 03/17/2023   Procedure: CARDIOVERSION;  Surgeon: Mona Vinie BROCKS, MD;  Location: [facility] INVASIVE CV LAB;  Service: Cardiovascular;  Laterality: N/A;   FOOT AMPUTATION Bilateral    I & D EXTREMITY Left 12/15/2017   Procedure: IRRIGATION AND DEBRIDEMENT LEFT FOOT ULCER;  Surgeon: Vernetta Lonni GRADE, MD;  Location: [facility] ORS;  Service: Orthopedics;  Laterality: Left;   I & D EXTREMITY Left 07/25/2020   Procedure: LEFT BELOW KNEE AMPUTATION ABSCESS EXCISION AND SKIN GRAFT;  Surgeon: Harden Jerona GAILS, MD;  Location: [facility] OR;  Service: Orthopedics;  Laterality: Left;   I & D EXTREMITY Left 08/22/2020   Procedure: DEBRIDEMENT LEFT BELOW KNEE AMPUTATION AND APPLY KERECIS SKIN GRAFT;  Surgeon: Harden Jerona GAILS, MD;  Location: [facility] OR;  Service: Orthopedics;  Laterality: Left;   LITHOTRIPSY     PERIPHERAL VASCULAR INTERVENTION  01/31/2023   Procedure: PERIPHERAL VASCULAR INTERVENTION;  Surgeon: Sheree Penne Lonni, MD;  Location: [facility] INVASIVE CV LAB;  Service: Cardiovascular;;   REVISION AMPUTATION, BELOW THE KNEE Right 02/10/2024   Procedure: REVISION AMPUTATION, BELOW THE KNEE;  Surgeon: Harden Jerona GAILS, MD;  Location: [facility] OR;  Service: Orthopedics;  Laterality: Right;   TEE WITHOUT CARDIOVERSION N/A 03/17/2023   Procedure: TRANSESOPHAGEAL ECHOCARDIOGRAM;  Surgeon: Mona Vinie BROCKS, MD;  Location: [facility] INVASIVE CV LAB;  Service: Cardiovascular;  Laterality: N/A;   TENDON LENGTHENING Bilateral    calf   TONSILLECTOMY     Social History   Occupational History   Occupation: DISABLED  Tobacco Use   Smoking status: Former    Current packs/day: 1.00    Average packs/day: 1 pack/day for 40.0 years (40.0 ttl pk-yrs)    Types: Cigarettes   Smokeless tobacco: Never  Vaping Use   Vaping status: Never Used  Substance and Sexual Activity   Alcohol use: No   Drug use: No   Sexual activity: Not on file

## 2024-04-16 ENCOUNTER — Ambulatory Visit: Payer: Self-pay | Admitting: Neurology

## 2024-04-18 ENCOUNTER — Telehealth: Payer: Self-pay

## 2024-04-18 NOTE — Telephone Encounter (Signed)
 Jessica with [facility] called concerning a suture that patient still has in his right stump and some swelling.  CB# [phone].  Called and left a VM for Jessica to CB to schedule an appt.

## 2024-04-20 ENCOUNTER — Ambulatory Visit (INDEPENDENT_AMBULATORY_CARE_PROVIDER_SITE_OTHER): Admitting: Family

## 2024-04-20 DIAGNOSIS — Z89611 Acquired absence of right leg above knee: Secondary | ICD-10-CM

## 2024-04-20 NOTE — Progress Notes (Signed)
 Post-Op Visit Note   Patient: Troy [name]           Date of Birth: [birth year]           MRN: [id number] Visit Date: 04/20/2024 PCP: Baird Comer GAILS, NP  Chief Complaint:  Chief Complaint  Patient presents with   Right Leg - Routine Post Op    02/10/24 revision right BKA    HPI:  HPI The patient is a 63 year old gentleman who is seen status post revision right above-knee amputation he presents today for concern of exposed absorbable suture Ortho Exam On examination right residual limb medially there is 1 suture present this is harvested today without incident Band-Aid applied there is no surrounding erythema no sign of infection  Visit Diagnoses: No diagnosis found.  Plan: He will follow-up as needed  Follow-Up Instructions: No follow-ups on file.   Imaging: No results found.  Orders:  No orders of the defined types were placed in this encounter.  No orders of the defined types were placed in this encounter.    PMFS History: Patient Active Problem List   Diagnosis Date Noted   Non-healing surgical wound 03/02/2024   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   Ventricular aneurysm 04/08/2023   (HFpEF) heart failure with preserved ejection fraction (HCC) 04/02/2023   Lactic acidosis 04/02/2023   Acute respiratory failure with hypoxia (HCC) 04/02/2023   Leukocytosis 04/02/2023   Uncontrolled type 2 diabetes mellitus with hyperglycemia, with long-term current use of insulin  (HCC) 04/02/2023   Hypochromic anemia 04/02/2023   PVD (peripheral vascular disease) 04/02/2023   Thrombocytosis 04/02/2023   Respiratory distress 03/21/2023   Chronic respiratory failure with hypoxia (HCC) 02/12/2023   Pulmonary infiltrates 02/12/2023   Acute on chronic heart failure with preserved ejection fraction (HFpEF) (HCC) 02/12/2023   Paroxysmal atrial flutter (HCC) 02/05/2023   Hypotension 02/05/2023   Atrial fibrillation with RVR (HCC) 02/04/2023   Atrial flutter (HCC) 02/04/2023   Diabetic  acidosis without coma (HCC)    Hypokalemia    Hypomagnesemia    Hyponatremia    Multifocal pneumonia    Parapneumonic effusion 08/18/2021   Bowel Ileus (HCC) 08/18/2021   ?? 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   Ketoacidosis due to type 2 diabetes mellitus (HCC) 08/15/2021   COVID-19 virus infection 08/15/2021   S/P AKA (above knee amputation), left (HCC) 02/12/2021   Sepsis due to left-sided neck cellulitis/MRSA 02/12/2021   Hypotensive episode    Elevated troponin    SIRS (systemic inflammatory response syndrome) (HCC) 02/11/2021   GERD (gastroesophageal reflux disease) 09/26/2017   Hyperlipidemia associated with type 2 diabetes mellitus (HCC) 07/08/2017   Snoring 06/07/2016   Chest tube in place 01/22/2016   Abnormal nuclear stress test 01/22/2016   Pain, chronic due to trauma 07/04/2012   Complex regional pain syndrome I of lower limb 07/04/2012   COPD (chronic obstructive pulmonary disease) (HCC) 01/27/2011   Pre-operative cardiovascular examination 01/27/2011   Nonspecific abnormal electrocardiogram (ECG) (EKG) 01/27/2011   Murmur 01/27/2011   DM2 (diabetes mellitus, type 2) (HCC)    HTN (hypertension)    HLD (hyperlipidemia)    Tobacco abuse    Past Medical History:  Diagnosis Date   Abscess of bursa of left ankle 01/21/2021   Abscess of leg without foot, left    Abscess of right lower leg 03/11/2023   Acute renal failure    in setting of NSAID use and orthopedic surgery 2010   Amputation of right great toe and 2nd Toe 02/12/2021  Anxiety and depression    Chronic diastolic CHF (congestive heart failure) (HCC)    a. Echo 6/17: severe conc LVH, vigorous EF, EF 65-70%, no dynamic obstruction, no RWMA, Gr 1 DD, mild TR  //  b. LHC 8/17: no sig CAD, LVEDP 28   Chronic osteomyelitis of right foot with draining sinus (HCC) 02/07/2023   Cirrhosis of liver (HCC)    COPD (chronic obstructive pulmonary disease) (HCC)    Dehiscence of amputation stump (HCC)    Diabetic  ulcer of left foot (HCC)    DM2 (diabetes mellitus, type 2) (HCC)    Dysrhythmia    Family history of early CAD    GERD (gastroesophageal reflux disease)    History of amputation of foot (HCC)    L trans-met // R toe   History of cardiac catheterization    a. LHC 2002: irregs  //  b. LHC in 8/17: no sig CAD, apical DK, hyperdynamic LV, LVEDP 28   History of kidney stones    History of nuclear stress test    a. Nuc 7/17: Overall, intermediate risk nuclear stress test secondary to small size of apical lateral defect and reduced ejection fraction.  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    Ischemia of left BKA site [facility])    Kidney calculi    Lower limb ischemia: ??? 08/22/2021   Non-healing wound of amputation stump (HCC) 02/10/2024   Osteomyelitis of ankle or foot, acute, right (HCC) 03/11/2023   Osteomyelitis of second toe of right foot (HCC) 07/12/2019   Palpitations    Pneumonia    PTSD (post-traumatic stress disorder)    Status post left foot surgery 12/15/2017   Tobacco abuse    Transient ischemic attack     Family History  Problem Relation Age of Onset   Leukemia Mother 40       died   Lung cancer Father 12       died   Heart attack Brother 29   Heart attack Brother 78   Hypertension Brother        X3   Hypertension Sister        X2   Diabetes Sister    Stroke Sister    Diabetes Sister    Other Brother        Set Designer accident    Past Surgical History:  Procedure Laterality Date   ABDOMINAL AORTOGRAM W/LOWER EXTREMITY N/A 01/31/2023   Procedure: ABDOMINAL AORTOGRAM W/LOWER EXTREMITY;  Surgeon: Sheree Penne Bruckner, MD;  Location: [facility] INVASIVE CV LAB;  Service: Cardiovascular;  Laterality: N/A;   AMPUTATION Left 01/03/2018   Procedure: LEFT MIDFOOT AMPUTATION/REVISION MIDAMPUTAION;  Surgeon: Vernetta Bruckner GRADE, MD;  Location: [facility] OR;  Service:  Orthopedics;  Laterality: Left;   AMPUTATION Left 01/25/2018   Procedure: LEFT BELOW KNEE AMPUTATION;  Surgeon: Harden Jerona GAILS, MD;  Location: [facility] OR;  Service: Orthopedics;  Laterality: Left;   AMPUTATION Left 01/21/2021   Procedure: LEFT ABOVE KNEE AMPUTATION;  Surgeon: Harden Jerona GAILS, MD;  Location: [facility] OR;  Service: Orthopedics;  Laterality: Left;   AMPUTATION Right 02/09/2023   Procedure: RIGHT TRANSMETATARSAL AMPUTATION;  Surgeon: Harden Jerona GAILS, MD;  Location: [facility] OR;  Service: Orthopedics;  Laterality: Right;   AMPUTATION Right 03/11/2023   Procedure: RIGHT BELOW KNEE AMPUTATION;  Surgeon: Harden Jerona GAILS,  MD;  Location: [facility] OR;  Service: Orthopedics;  Laterality: Right;   AMPUTATION Right 03/02/2024   Procedure: AMPUTATION, ABOVE KNEE;  Surgeon: Harden Jerona GAILS, MD;  Location: [facility] OR;  Service: Orthopedics;  Laterality: Right;   AMPUTATION TOE Right 07/17/2019   Procedure: AMPUTATION RIGHT FOOT 2ND TOE;  Surgeon: Vernetta Lonni GRADE, MD;  Location: [facility];  Service: Orthopedics;  Laterality: Right;   APPLICATION OF WOUND VAC Left 01/21/2021   Procedure: APPLICATION OF WOUND VAC;  Surgeon: Harden Jerona GAILS, MD;  Location: [facility] OR;  Service: Orthopedics;  Laterality: Left;   APPLICATION OF WOUND VAC Right 02/10/2024   Procedure: APPLICATION, WOUND VAC;  Surgeon: Harden Jerona GAILS, MD;  Location: [facility] OR;  Service: Orthopedics;  Laterality: Right;   APPLICATION OF WOUND VAC Right 03/02/2024   Procedure: APPLICATION, WOUND VAC;  Surgeon: Harden Jerona GAILS, MD;  Location: [facility] OR;  Service: Orthopedics;  Laterality: Right;   BELOW KNEE LEG AMPUTATION Left 01/25/2018   CARDIAC CATHETERIZATION N/A 01/22/2016   Procedure: Left Heart Cath and Coronary Angiography;  Surgeon: Peter M Jordan, MD;  Location: [facility] INVASIVE CV LAB;  Service: Cardiovascular;  Laterality: N/A;   CARDIOVERSION N/A 03/17/2023   Procedure: CARDIOVERSION;  Surgeon: Mona Vinie BROCKS, MD;  Location: [facility] INVASIVE CV LAB;  Service:  Cardiovascular;  Laterality: N/A;   FOOT AMPUTATION Bilateral    I & D EXTREMITY Left 12/15/2017   Procedure: IRRIGATION AND DEBRIDEMENT LEFT FOOT ULCER;  Surgeon: Vernetta Lonni GRADE, MD;  Location: [facility] ORS;  Service: Orthopedics;  Laterality: Left;   I & D EXTREMITY Left 07/25/2020   Procedure: LEFT BELOW KNEE AMPUTATION ABSCESS EXCISION AND SKIN GRAFT;  Surgeon: Harden Jerona GAILS, MD;  Location: [facility] OR;  Service: Orthopedics;  Laterality: Left;   I & D EXTREMITY Left 08/22/2020   Procedure: DEBRIDEMENT LEFT BELOW KNEE AMPUTATION AND APPLY KERECIS SKIN GRAFT;  Surgeon: Harden Jerona GAILS, MD;  Location: [facility] OR;  Service: Orthopedics;  Laterality: Left;   LITHOTRIPSY     PERIPHERAL VASCULAR INTERVENTION  01/31/2023   Procedure: PERIPHERAL VASCULAR INTERVENTION;  Surgeon: Sheree Penne Lonni, MD;  Location: [facility] INVASIVE CV LAB;  Service: Cardiovascular;;   REVISION AMPUTATION, BELOW THE KNEE Right 02/10/2024   Procedure: REVISION AMPUTATION, BELOW THE KNEE;  Surgeon: Harden Jerona GAILS, MD;  Location: [facility] OR;  Service: Orthopedics;  Laterality: Right;   TEE WITHOUT CARDIOVERSION N/A 03/17/2023   Procedure: TRANSESOPHAGEAL ECHOCARDIOGRAM;  Surgeon: Mona Vinie BROCKS, MD;  Location: [facility] INVASIVE CV LAB;  Service: Cardiovascular;  Laterality: N/A;   TENDON LENGTHENING Bilateral    calf   TONSILLECTOMY     Social History   Occupational History   Occupation: DISABLED  Tobacco Use   Smoking status: Former    Current packs/day: 1.00    Average packs/day: 1 pack/day for 40.0 years (40.0 ttl pk-yrs)    Types: Cigarettes   Smokeless tobacco: Never  Vaping Use   Vaping status: Never Used  Substance and Sexual Activity   Alcohol use: No   Drug use: No   Sexual activity: Not on file

## 2024-04-22 NOTE — Progress Notes (Unsigned)
 Cardiology Office Note:  .   Date:  04/24/2024  ID:  Troy [name], DOB May 04, 1961, MRN [id number] PCP: Baird Comer GAILS, NP  [facility] Providers Cardiologist:  None Electrophysiologist:  OLE ONEIDA HOLTS, MD { History of Present Illness: .    Chief Complaint  Patient presents with   Follow-up         Troy [name] is a 63 y.o. male with below history who presents for follow-up.   History of Present Illness   Troy [name] is a 63 year old male with coronary artery disease, HLD, LV apical aneurysm, and diabetes who presents for follow-up.  He has a history of coronary artery disease with a left ventricular apical aneurysm. Recent CT scan results show a moderate mid LAD lesion, severe OM1 lesion, and moderate Ramus lesion. The CT FFR indicates the LAD is negative, the OM is small, and the Ramus was CT FFR 0.79 (borderline). No chest pain or trouble breathing. His ejection fraction is normal.  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 He is currently living in a skilled nursing facility.  He has a history of uncontrolled diabetes with a recent A1c of 7.5, improved from a previous 11.7. He is working on managing his diabetes.  He is on Eliquis  5 mg twice daily for a history of atrial flutter and is maintaining sinus rhythm. He also takes Lasix  40 mg daily. He reports no symptoms and is not on any blood pressure medications. His blood pressures have been stable around 120s.  His most recent LDL cholesterol level is 47, and he is on Crestor  for cholesterol management. No swelling or other symptoms.          Problem List PAD -R BKA 02/2023 -L AKA DM -A1c 11.7 HTN HLD -T chol 108, HDL 40, LDL 47, TG 104 HFpEF Atrial flutter -Dx 03/2023 7. LV apical aneurysm  -EF 70-75% 8. CAD -CAC 1161 (95th percentile) -50-70% mLAD (CT FFR 0.86) -70-99% OM1 (too small for CT FFR) -50-70%  ramus (CT FFR 0.79)     ROS: All other ROS reviewed and negative. Pertinent positives noted in the HPI.     Studies Reviewed: SABRA       CCTA 10/01/2023 IMPRESSION: 1. Coronary calcium  score of 1161. This was 95th percentile for age, sex, and race matched control. Total plaque volume 933 cubic mm, this is greater than 75th percentile for age and gender.   2. Normal coronary origin with right dominance.   3. CAD-RADS 4a Severe stenosis. (70-99%). Cardiac catheterization or CT FFR is recommended. Consider symptom-guided anti-ischemic pharmacotherapy as well as risk factor modification per guideline directed care.   4. Apical akinesis and aneurysm. This can be seen in prior infarct. Physical Exam:   VS:  BP (!) 98/52 (BP Location: Left Arm, Patient Position: Sitting, Cuff Size: Large)   Pulse 82   Ht 6' (1.829 m)   Wt 189 lb (85.7 kg)   SpO2 98%   BMI 25.63 kg/m    Wt Readings from Last 3 Encounters:  04/24/24 189 lb (85.7 kg)  03/22/24 177 lb (80.3 kg)  03/02/24 175 lb (79.4 kg)    GEN: Well nourished, well developed in no acute distress NECK: No JVD; No carotid bruits CARDIAC: RRR, no murmurs, rubs, gallops RESPIRATORY:  Clear to auscultation without rales, wheezing or rhonchi  ABDOMEN: Soft, non-tender, non-distended EXTREMITIES:  bilateral amputations  ASSESSMENT  AND PLAN: .   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 CT FFR: LAD negative, OM small/obstructive, Ramus borderline positive. Small asymptomatic LV apical aneurysm, likely CAD related. Managed medically per preference against catheterization. - Continue medical management of coronary artery disease. - No ASA since on eliquis  - On crestor  20 mg daily. LDL at goal. A1c improved.  - Monitor for symptoms such as chest pain or dyspnea. -  Scheduled follow-up in one year unless symptoms develop.  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 Residing in skilled nursing facility. - Continue current management and monitoring.  Type 2 diabetes mellitus, uncontrolled Type 2 diabetes mellitus with recent A1c of 7.5, improved from 11.7. - Continue current diabetes management plan.  History of atrial flutter, on anticoagulation Atrial flutter managed with Eliquis  5 mg BID. No symptoms reported. - Continue Eliquis  5 mg BID.              Follow-up: Return in about 1 year (around 04/24/2025).   Signed, Darryle DASEN. Barbaraann, MD, [facility]  [facility]  [address] [place], [place] [address] [phone]  3:19 PM

## 2024-04-23 ENCOUNTER — Encounter: Payer: Self-pay | Admitting: Radiology

## 2024-04-24 ENCOUNTER — Ambulatory Visit: Attending: Cardiovascular Disease | Admitting: Cardiovascular Disease

## 2024-04-24 ENCOUNTER — Encounter: Payer: Self-pay | Admitting: Cardiovascular Disease

## 2024-04-24 VITALS — BP 98/52 | HR 82 | Ht 72.0 in | Wt 189.0 lb

## 2024-04-24 DIAGNOSIS — I2510 Atherosclerotic heart disease of native coronary artery without angina pectoris: Secondary | ICD-10-CM

## 2024-04-24 DIAGNOSIS — I253 Aneurysm of heart: Secondary | ICD-10-CM | POA: Diagnosis not present

## 2024-04-24 DIAGNOSIS — R931 Abnormal findings on diagnostic imaging of heart and coronary circulation: Secondary | ICD-10-CM | POA: Diagnosis not present

## 2024-04-24 DIAGNOSIS — E782 Mixed hyperlipidemia: Secondary | ICD-10-CM | POA: Diagnosis not present

## 2024-04-24 DIAGNOSIS — I4892 Unspecified atrial flutter: Secondary | ICD-10-CM

## 2024-04-24 DIAGNOSIS — I10 Essential (primary) hypertension: Secondary | ICD-10-CM

## 2024-04-24 DIAGNOSIS — I5032 Chronic diastolic (congestive) heart failure: Secondary | ICD-10-CM

## 2024-04-24 DIAGNOSIS — I739 Peripheral vascular disease, unspecified: Secondary | ICD-10-CM

## 2024-04-24 NOTE — Patient Instructions (Addendum)
 Medication Instructions:   Your physician recommends that you continue on your current medications as directed. Please refer to the Current Medication list given to you today.  Lab Work: NONE ORDERED  TODAY  Testing/Procedures: NONE ORDERED  TODAY  Follow-Up: At [facility], you and your health needs are our priority.  As part of our continuing mission to provide you with exceptional heart care, our providers are all part of one team.  This team includes your primary Cardiologist (physician) and Advanced Practice Providers or APPs (Physician Assistants and Nurse Practitioners) who all work together to provide you with the care you need, when you need it.  Your next appointment:   1 year(s)  Dr. MALVA Mulch

## 2024-10-02 ENCOUNTER — Ambulatory Visit: Admitting: Adult Health
